# Patient Record
Sex: Male | Born: 1940 | Race: White | Hispanic: No | Smoking: Former smoker
Health system: Southern US, Community
[De-identification: ages and names within clinical notes are randomized; demographics above are authoritative.]

## PROBLEM LIST (undated history)

## (undated) ENCOUNTER — Emergency Department (HOSPITAL_BASED_OUTPATIENT_CLINIC_OR_DEPARTMENT_OTHER): Admission: EM | Discharge status: Absent without leave | Payer: Medicare Other

## (undated) DIAGNOSIS — I219 Acute myocardial infarction, unspecified: Secondary | ICD-10-CM

## (undated) DIAGNOSIS — T402X5A Adverse effect of other opioids, initial encounter: Secondary | ICD-10-CM

## (undated) DIAGNOSIS — K5903 Drug induced constipation: Secondary | ICD-10-CM

## (undated) DIAGNOSIS — I679 Cerebrovascular disease, unspecified: Secondary | ICD-10-CM

## (undated) DIAGNOSIS — Q7290 Unspecified reduction defect of unspecified lower limb: Secondary | ICD-10-CM

## (undated) DIAGNOSIS — N138 Other obstructive and reflux uropathy: Secondary | ICD-10-CM

## (undated) DIAGNOSIS — J449 Chronic obstructive pulmonary disease, unspecified: Secondary | ICD-10-CM

## (undated) DIAGNOSIS — I739 Peripheral vascular disease, unspecified: Secondary | ICD-10-CM

## (undated) DIAGNOSIS — I10 Essential (primary) hypertension: Secondary | ICD-10-CM

## (undated) DIAGNOSIS — Z960 Presence of urogenital implants: Secondary | ICD-10-CM

## (undated) DIAGNOSIS — I6529 Occlusion and stenosis of unspecified carotid artery: Secondary | ICD-10-CM

## (undated) DIAGNOSIS — I441 Atrioventricular block, second degree: Secondary | ICD-10-CM

## (undated) DIAGNOSIS — I509 Heart failure, unspecified: Secondary | ICD-10-CM

## (undated) DIAGNOSIS — K7460 Unspecified cirrhosis of liver: Secondary | ICD-10-CM

## (undated) DIAGNOSIS — R52 Pain, unspecified: Secondary | ICD-10-CM

## (undated) DIAGNOSIS — I2510 Atherosclerotic heart disease of native coronary artery without angina pectoris: Secondary | ICD-10-CM

## (undated) DIAGNOSIS — G709 Myoneural disorder, unspecified: Secondary | ICD-10-CM

## (undated) DIAGNOSIS — E785 Hyperlipidemia, unspecified: Secondary | ICD-10-CM

## (undated) DIAGNOSIS — Z87891 Personal history of nicotine dependence: Secondary | ICD-10-CM

## (undated) DIAGNOSIS — I714 Abdominal aortic aneurysm, without rupture, unspecified: Secondary | ICD-10-CM

## (undated) DIAGNOSIS — R0602 Shortness of breath: Secondary | ICD-10-CM

## (undated) DIAGNOSIS — H5440 Blindness, one eye, unspecified eye: Secondary | ICD-10-CM

## (undated) DIAGNOSIS — N401 Enlarged prostate with lower urinary tract symptoms: Secondary | ICD-10-CM

## (undated) HISTORY — DX: Acute myocardial infarction, unspecified: I21.9

## (undated) HISTORY — DX: Atrioventricular block, second degree: I44.1

## (undated) HISTORY — DX: Chronic obstructive pulmonary disease, unspecified: J44.9

## (undated) HISTORY — DX: Atherosclerotic heart disease of native coronary artery without angina pectoris: I25.10

## (undated) HISTORY — DX: Cerebrovascular disease, unspecified: I67.9

## (undated) HISTORY — DX: Abdominal aortic aneurysm, without rupture, unspecified: I71.40

## (undated) HISTORY — DX: Essential (primary) hypertension: I10

## (undated) HISTORY — DX: Abdominal aortic aneurysm, without rupture: I71.4

## (undated) HISTORY — DX: Personal history of nicotine dependence: Z87.891

## (undated) HISTORY — DX: Unspecified reduction defect of unspecified lower limb: Q72.90

## (undated) HISTORY — DX: Benign prostatic hyperplasia with lower urinary tract symptoms: N40.1

## (undated) HISTORY — DX: Unspecified cirrhosis of liver: K74.60

## (undated) HISTORY — DX: Hyperlipidemia, unspecified: E78.5

## (undated) HISTORY — DX: Other obstructive and reflux uropathy: N13.8

## (undated) HISTORY — DX: Peripheral vascular disease, unspecified: I73.9

---

## 2008-10-06 ENCOUNTER — Ambulatory Visit: Payer: Self-pay | Admitting: *Deleted

## 2008-10-06 ENCOUNTER — Encounter (INDEPENDENT_AMBULATORY_CARE_PROVIDER_SITE_OTHER): Payer: Self-pay | Admitting: *Deleted

## 2008-10-06 ENCOUNTER — Inpatient Hospital Stay (HOSPITAL_COMMUNITY): Admission: EM | Admit: 2008-10-06 | Discharge: 2008-10-13 | Payer: Self-pay | Admitting: *Deleted

## 2008-10-06 ENCOUNTER — Ambulatory Visit: Payer: Self-pay | Admitting: Cardiology

## 2008-10-06 ENCOUNTER — Encounter: Payer: Self-pay | Admitting: Cardiology

## 2008-10-07 ENCOUNTER — Encounter: Payer: Self-pay | Admitting: Cardiology

## 2008-10-07 ENCOUNTER — Ambulatory Visit: Payer: Self-pay | Admitting: Thoracic Surgery (Cardiothoracic Vascular Surgery)

## 2008-10-07 ENCOUNTER — Encounter: Payer: Self-pay | Admitting: Internal Medicine

## 2008-10-09 ENCOUNTER — Ambulatory Visit: Payer: Self-pay | Admitting: Pulmonary Disease

## 2008-10-10 ENCOUNTER — Encounter: Payer: Self-pay | Admitting: Cardiothoracic Surgery

## 2008-10-30 ENCOUNTER — Encounter: Payer: Self-pay | Admitting: Cardiology

## 2008-10-30 ENCOUNTER — Ambulatory Visit: Payer: Self-pay | Admitting: Thoracic Surgery (Cardiothoracic Vascular Surgery)

## 2008-11-03 ENCOUNTER — Inpatient Hospital Stay (HOSPITAL_COMMUNITY)
Admission: RE | Admit: 2008-11-03 | Discharge: 2008-11-09 | Payer: Self-pay | Admitting: Thoracic Surgery (Cardiothoracic Vascular Surgery)

## 2008-11-03 ENCOUNTER — Encounter: Payer: Self-pay | Admitting: Thoracic Surgery (Cardiothoracic Vascular Surgery)

## 2008-11-03 ENCOUNTER — Ambulatory Visit: Payer: Self-pay | Admitting: Thoracic Surgery (Cardiothoracic Vascular Surgery)

## 2008-11-03 ENCOUNTER — Encounter: Payer: Self-pay | Admitting: Cardiology

## 2008-11-03 HISTORY — PX: CORONARY ARTERY BYPASS GRAFT: SHX141

## 2008-11-13 DIAGNOSIS — I10 Essential (primary) hypertension: Secondary | ICD-10-CM | POA: Insufficient documentation

## 2008-11-13 DIAGNOSIS — E785 Hyperlipidemia, unspecified: Secondary | ICD-10-CM | POA: Insufficient documentation

## 2008-11-13 DIAGNOSIS — J449 Chronic obstructive pulmonary disease, unspecified: Secondary | ICD-10-CM | POA: Insufficient documentation

## 2008-11-13 DIAGNOSIS — I219 Acute myocardial infarction, unspecified: Secondary | ICD-10-CM | POA: Insufficient documentation

## 2008-11-13 DIAGNOSIS — K560 Paralytic ileus: Secondary | ICD-10-CM | POA: Insufficient documentation

## 2008-11-13 DIAGNOSIS — Z951 Presence of aortocoronary bypass graft: Secondary | ICD-10-CM | POA: Insufficient documentation

## 2008-11-13 DIAGNOSIS — I2510 Atherosclerotic heart disease of native coronary artery without angina pectoris: Secondary | ICD-10-CM | POA: Insufficient documentation

## 2008-11-13 DIAGNOSIS — D649 Anemia, unspecified: Secondary | ICD-10-CM | POA: Insufficient documentation

## 2008-11-14 ENCOUNTER — Ambulatory Visit: Payer: Self-pay | Admitting: Cardiology

## 2008-11-22 ENCOUNTER — Telehealth: Payer: Self-pay | Admitting: Cardiology

## 2008-12-04 ENCOUNTER — Encounter: Payer: Self-pay | Admitting: Cardiology

## 2008-12-04 ENCOUNTER — Ambulatory Visit: Payer: Self-pay | Admitting: Thoracic Surgery (Cardiothoracic Vascular Surgery)

## 2008-12-04 ENCOUNTER — Encounter
Admission: RE | Admit: 2008-12-04 | Discharge: 2008-12-04 | Payer: Self-pay | Admitting: Thoracic Surgery (Cardiothoracic Vascular Surgery)

## 2008-12-15 ENCOUNTER — Telehealth: Payer: Self-pay | Admitting: Cardiology

## 2009-01-08 ENCOUNTER — Ambulatory Visit: Payer: Self-pay | Admitting: Cardiology

## 2009-01-08 DIAGNOSIS — R0989 Other specified symptoms and signs involving the circulatory and respiratory systems: Secondary | ICD-10-CM | POA: Insufficient documentation

## 2009-01-08 DIAGNOSIS — I6529 Occlusion and stenosis of unspecified carotid artery: Secondary | ICD-10-CM | POA: Insufficient documentation

## 2009-01-09 LAB — CONVERTED CEMR LAB
ALT: 36 units/L (ref 0–53)
AST: 29 units/L (ref 0–37)
Albumin: 3.8 g/dL (ref 3.5–5.2)
Alkaline Phosphatase: 78 units/L (ref 39–117)
BUN: 17 mg/dL (ref 6–23)
Bilirubin, Direct: 0.1 mg/dL (ref 0.0–0.3)
CO2: 28 meq/L (ref 19–32)
Calcium: 9.1 mg/dL (ref 8.4–10.5)
Chloride: 107 meq/L (ref 96–112)
Cholesterol: 136 mg/dL (ref 0–200)
Creatinine, Ser: 0.9 mg/dL (ref 0.4–1.5)
GFR calc non Af Amer: 89.68 mL/min (ref >60)
Glucose, Bld: 81 mg/dL (ref 70–99)
HDL: 35.9 mg/dL — ABNORMAL LOW (ref >39.00)
LDL Cholesterol: 86 mg/dL (ref 0–99)
Potassium: 3.4 meq/L — ABNORMAL LOW (ref 3.5–5.1)
Sodium: 142 meq/L (ref 135–145)
Total Bilirubin: 1.2 mg/dL (ref 0.3–1.2)
Total CHOL/HDL Ratio: 4
Total Protein: 8.2 g/dL (ref 6.0–8.3)
Triglycerides: 70 mg/dL (ref 0.0–149.0)
VLDL: 14 mg/dL (ref 0.0–40.0)

## 2009-01-19 ENCOUNTER — Ambulatory Visit: Payer: Self-pay

## 2009-01-19 ENCOUNTER — Ambulatory Visit: Payer: Self-pay | Admitting: Cardiology

## 2009-01-19 DIAGNOSIS — E875 Hyperkalemia: Secondary | ICD-10-CM | POA: Insufficient documentation

## 2009-01-22 ENCOUNTER — Encounter (INDEPENDENT_AMBULATORY_CARE_PROVIDER_SITE_OTHER): Payer: Self-pay | Admitting: *Deleted

## 2009-01-22 LAB — CONVERTED CEMR LAB
BUN: 18 mg/dL (ref 6–23)
CO2: 26 meq/L (ref 19–32)
Calcium: 9 mg/dL (ref 8.4–10.5)
Chloride: 107 meq/L (ref 96–112)
Creatinine, Ser: 0.9 mg/dL (ref 0.4–1.5)
GFR calc non Af Amer: 89.67 mL/min (ref >60)
Glucose, Bld: 131 mg/dL — ABNORMAL HIGH (ref 70–99)
Potassium: 4.1 meq/L (ref 3.5–5.1)
Sodium: 140 meq/L (ref 135–145)

## 2009-01-30 ENCOUNTER — Ambulatory Visit: Payer: Self-pay | Admitting: Cardiology

## 2009-02-06 ENCOUNTER — Encounter (INDEPENDENT_AMBULATORY_CARE_PROVIDER_SITE_OTHER): Payer: Self-pay | Admitting: *Deleted

## 2009-02-15 ENCOUNTER — Telehealth: Payer: Self-pay | Admitting: Cardiology

## 2009-02-22 ENCOUNTER — Encounter: Payer: Self-pay | Admitting: Cardiology

## 2009-03-02 ENCOUNTER — Telehealth: Payer: Self-pay | Admitting: Cardiology

## 2009-03-28 ENCOUNTER — Ambulatory Visit: Payer: Self-pay | Admitting: Cardiology

## 2009-03-30 ENCOUNTER — Encounter (INDEPENDENT_AMBULATORY_CARE_PROVIDER_SITE_OTHER): Payer: Self-pay | Admitting: *Deleted

## 2009-03-30 LAB — CONVERTED CEMR LAB
ALT: 34 units/L (ref 0–53)
AST: 33 units/L (ref 0–37)
Albumin: 3.9 g/dL (ref 3.5–5.2)
Alkaline Phosphatase: 66 units/L (ref 39–117)
Bilirubin, Direct: 0.3 mg/dL (ref 0.0–0.3)
Cholesterol: 120 mg/dL (ref 0–200)
HDL: 40.1 mg/dL (ref >39.00)
LDL Cholesterol: 70 mg/dL (ref 0–99)
Total Bilirubin: 1.6 mg/dL — ABNORMAL HIGH (ref 0.3–1.2)
Total CHOL/HDL Ratio: 3
Total Protein: 8.1 g/dL (ref 6.0–8.3)
Triglycerides: 49 mg/dL (ref 0.0–149.0)
VLDL: 9.8 mg/dL (ref 0.0–40.0)

## 2009-04-11 ENCOUNTER — Ambulatory Visit: Payer: Self-pay | Admitting: Internal Medicine

## 2009-04-11 DIAGNOSIS — Z87891 Personal history of nicotine dependence: Secondary | ICD-10-CM | POA: Insufficient documentation

## 2009-04-11 DIAGNOSIS — Q7290 Unspecified reduction defect of unspecified lower limb: Secondary | ICD-10-CM | POA: Insufficient documentation

## 2009-04-12 ENCOUNTER — Telehealth: Payer: Self-pay | Admitting: Internal Medicine

## 2009-04-12 DIAGNOSIS — I4891 Unspecified atrial fibrillation: Secondary | ICD-10-CM | POA: Insufficient documentation

## 2009-05-07 ENCOUNTER — Telehealth: Payer: Self-pay | Admitting: Internal Medicine

## 2009-05-08 ENCOUNTER — Telehealth (INDEPENDENT_AMBULATORY_CARE_PROVIDER_SITE_OTHER): Payer: Self-pay | Admitting: *Deleted

## 2009-05-08 DIAGNOSIS — M549 Dorsalgia, unspecified: Secondary | ICD-10-CM | POA: Insufficient documentation

## 2009-07-11 ENCOUNTER — Ambulatory Visit: Payer: Self-pay | Admitting: Internal Medicine

## 2009-07-13 ENCOUNTER — Telehealth: Payer: Self-pay | Admitting: Internal Medicine

## 2009-08-21 ENCOUNTER — Telehealth: Payer: Self-pay | Admitting: Internal Medicine

## 2009-09-14 ENCOUNTER — Telehealth: Payer: Self-pay | Admitting: Internal Medicine

## 2009-11-20 ENCOUNTER — Telehealth: Payer: Self-pay | Admitting: Cardiology

## 2009-11-27 ENCOUNTER — Telehealth: Payer: Self-pay | Admitting: Internal Medicine

## 2010-01-07 ENCOUNTER — Ambulatory Visit: Payer: Self-pay | Admitting: Internal Medicine

## 2010-01-07 DIAGNOSIS — J329 Chronic sinusitis, unspecified: Secondary | ICD-10-CM | POA: Insufficient documentation

## 2010-01-29 ENCOUNTER — Encounter: Payer: Self-pay | Admitting: Internal Medicine

## 2010-04-18 ENCOUNTER — Ambulatory Visit: Payer: Self-pay | Admitting: Cardiology

## 2010-05-15 ENCOUNTER — Telehealth: Payer: Self-pay | Admitting: Internal Medicine

## 2010-05-20 ENCOUNTER — Other Ambulatory Visit: Payer: Self-pay | Admitting: Internal Medicine

## 2010-05-20 ENCOUNTER — Ambulatory Visit
Admission: RE | Admit: 2010-05-20 | Discharge: 2010-05-20 | Payer: Self-pay | Source: Home / Self Care | Attending: Internal Medicine | Admitting: Internal Medicine

## 2010-05-20 LAB — LIPID PANEL
Cholesterol: 130 mg/dL (ref 0–200)
HDL: 48.4 mg/dL (ref >39.00)
LDL Cholesterol: 72 mg/dL (ref 0–99)
Total CHOL/HDL Ratio: 3
Triglycerides: 50 mg/dL (ref 0.0–149.0)
VLDL: 10 mg/dL (ref 0.0–40.0)

## 2010-05-20 LAB — HEPATIC FUNCTION PANEL
ALT: 54 U/L — ABNORMAL HIGH (ref 0–53)
AST: 42 U/L — ABNORMAL HIGH (ref 0–37)
Albumin: 4.2 g/dL (ref 3.5–5.2)
Alkaline Phosphatase: 79 U/L (ref 39–117)
Bilirubin, Direct: 0.2 mg/dL (ref 0.0–0.3)
Total Bilirubin: 1.1 mg/dL (ref 0.3–1.2)
Total Protein: 8.2 g/dL (ref 6.0–8.3)

## 2010-05-21 NOTE — Miscellaneous (Signed)
Summary: Care plan for PT/[facility]  Care plan for PT/[facility]   Imported By: Sherian Rein 01/30/2010 14:23:25  _____________________________________________________________________  External Attachment:    Type:   Image     Comment:   External Document

## 2010-05-21 NOTE — Assessment & Plan Note (Signed)
Summary: NEW MEDICARE-PER [facility]/ [facility] #  [facility]   Vital Signs:  Patient profile:   70 year old male Height:      71 inches (180.34 cm) Weight:      220.8 pounds (100.36 kg) O2 Sat:      97 % on Room air Temp:     97.5 degrees F (36.39 degrees C) oral Pulse rate:   66 / minute BP sitting:   132 / 70  (left arm) Cuff size:   large  Vitals Entered By: Orlan Leavens (April 11, 2009 3:07 PM)  O2 Flow:  Room air CC: New patient Is Patient Diabetic? No Pain Assessment Patient in pain? no        Primary Care Provider:  Newt Lukes MD  CC:  New patient.  History of Present Illness: new pt to me and our division - here to est PCP  1) HTN - reports compliance with ongoing medical treatment and no changes in medication dose or frequency. denies adverse side effects related to current therapy.   2) dyslipidemia - reports compliance with ongoing medical treatment and no changes in medication dose or frequency. denies adverse side effects related to current therapy.   3) CAD - s/p CABG 10/2008 reports compliance with ongoing medical treatment and no changes in medication dose or frequency. denies adverse side effects related to current therapy. 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 - pain was imporved after taking percocet s/p CABG but caused "foggy head" - ?if any other pain pill he could take for bad days - has not pursued lift shoes or PT or other therapy since the 70s -  Preventive Screening-Counseling & Management  Alcohol-Tobacco     Alcohol drinks/day: 0     Alcohol Counseling: not indicated; patient does not drink     Smoking Status: quit < 6 months     Tobacco Counseling: to remain off tobacco products  Caffeine-Diet-Exercise     Depression Counseling: not indicated; screening negative for depression  Clinical Review Panels:  Immunizations   Last Flu Vaccine:  Historical (01/19/2009)    Last Pneumovax:  Historical (01/19/2009)  Lipid Management   Cholesterol:  120 (03/28/2009)   LDL (bad choesterol):  70 (03/28/2009)   HDL (good cholesterol):  40.10 (03/28/2009)  Complete Metabolic Panel   Glucose:  131 (01/19/2009)   Sodium:  140 (01/19/2009)   Potassium:  4.1 (01/19/2009)   Chloride:  107 (01/19/2009)   CO2:  26 (01/19/2009)   BUN:  18 (01/19/2009)   Creatinine:  0.9 (01/19/2009)   Albumin:  3.9 (03/28/2009)   Total Protein:  8.1 (03/28/2009)   Calcium:  9.0 (01/19/2009)   Total Bili:  1.6 (03/28/2009)   Alk Phos:  66 (03/28/2009)   SGPT (ALT):  34 (03/28/2009)   SGOT (AST):  33 (03/28/2009)   Current Medications (verified): 1)  Aspirin 81 Mg Tbec (Aspirin) .... Take One Tablet By Mouth Daily 2)  Metoprolol Tartrate 25 Mg Tabs (Metoprolol Tartrate) .... Take One Half Tablet By Mouth Twice A Day 3)  Furosemide 40 Mg Tabs (Furosemide) .... Take One Tablet By Mouth Daily. 4)  Potassium Chloride Crys Cr 20 Meq Cr-Tabs (Potassium Chloride Crys Cr) .... One Tablet By Mouth Twice Daily 5)  Wellbutrin Xl 150 Mg Xr24h-Tab (Bupropion Hcl) .[name].. 1 Tab By Mouth Once Daily 6)  Lipitor 40 Mg Tabs (Atorvastatin Calcium) .... Take  One Tablet By Mouth Daily. 7)  Mucinex 600 Mg Xr12h-Tab (Guaifenesin) .[name].. 1 Tab By Mouth Two Times A Day 8)  Oxycodone-Acetaminophen 5-500 Mg Caps (Oxycodone-Acetaminophen) .... As Needed 9)  Albuterol Sulfate (2.5 Mg/20ml) 0.083% Nebu (Albuterol Sulfate) .... 2 Puffs As Needed 10)  Nitroglycerin 0.4 Mg Subl (Nitroglycerin) .... One Tablet Under Tongue Every 5 Minutes As Needed For Chest Pain---May Repeat Times Three 11)  Ativan 0.5 Mg Tabs (Lorazepam) .... As Needed 12)  Ambien 5 Mg Tabs (Zolpidem Tartrate) .... Take 1 Tab By Mouth At Bedtime As Needed For Sleep 13)  Amlodipine Besylate 10 Mg Tabs (Amlodipine Besylate) .... One By Mouth Daily 14)  Niaspan 500 Mg Cr-Tabs (Niacin (Antihyperlipidemic)) .... 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  11/03/2008 - [facility]  Family History: His mother died at 70 with cirrhosis  father died at 87.  His father took a nap on the couch and was found dead on  the floor beside the couch with no other information available and no autopsy performed, although the family was told it was heart attack.   There are no siblings with coronary artery disease.   Social History: patient is widowed, and lives alone here in [place].   He has 1 son, who lives in the [place], and he has an uncle, who lives nearby as well.   The patient reports a previous history of heavy alcohol use, but he quit drinking many years ago.  Tobacco Use - Former.  Smoking Status:  quit < 6 months  Review of Systems       see HPI above. I have reviewed all other systems and they were negative.   Physical Exam  General:  alert, well-developed, well-nourished, and cooperative to examination.    Eyes:  vision grossly intact; pupils equal, round and reactive to light.  conjunctiva and lids normal.    Ears:  normal pinnae bilaterally, without erythema, swelling, or tenderness to palpation. TMs clear, without effusion, or cerumen impaction. Hearing grossly normal bilaterally  Mouth:  teeth and gums in good repair; mucous membranes moist, without lesions or ulcers. oropharynx clear without  exudate, no erythema.  Lungs:  normal respiratory effort, no intercostal retractions or use of accessory muscles; normal breath sounds bilaterally - no crackles and no wheezes.    Heart:  regular rate and rhythm with 2/6 systolic murmur left sternal border. Radiates to the carotids. Abdomen:  soft, non-tender, normal bowel sounds, no distention; no masses and no appreciable hepatomegaly or splenomegaly.   Msk:  No deformity or scoliosis noted of thoracic or lumbar spine.  notable LLE shortening compared to RLE Neurologic:  alert & oriented X3 and cranial nerves II-XII symetrically intact.  strength normal in all extremities, sensation intact to light touch, and altered gait due to leg length disrecpancy. speech fluent without dysarthria or aphasia; follows commands with good comprehension.  Skin:  no rashes, vesicles, ulcers, or erythema. No  nodules or irregularity to palpation.  Psych:  Oriented X3, memory intact for recent and remote, normally interactive, good eye contact, not anxious appearing, not depressed appearing, and not agitated.      Impression & Recommendations:  Problem # 1:  CAD (ICD-414.00) cabg 11/03/08 hx reviewed - no residual or active symptoms  cont med mgmt as ongoing  Time spent with patient 45 minutes, more than 50% of this time was spent counseling patient on his chronic health issues and their med mgmt as well as need for continued survellience even in absence of active symptoms - also educated on med mgmt of chronic pain and prefered approach at treating causes of pain over covering the pain itself   His updated medication list for this problem includes:    Aspirin 81 Mg Tbec (Aspirin) .[name]... Take one tablet by mouth daily    Metoprolol Tartrate 25 Mg Tabs (Metoprolol tartrate) .[name]... Take one half tablet by mouth twice a day    Furosemide 40 Mg Tabs (Furosemide) .[name]... Take one tablet by mouth daily.    Nitroglycerin 0.4 Mg Subl (Nitroglycerin) ..... One tablet under  tongue every 5 minutes as needed for chest pain---may repeat times three    Amlodipine Besylate 10 Mg Tabs (Amlodipine besylate) ..... One by mouth daily  Labs Reviewed: Chol: 120 (03/28/2009)   HDL: 40.10 (03/28/2009)   LDL: 70 (03/28/2009)   TG: 49.0 (03/28/2009)  Problem # 2:  HYPERTENSION (ICD-401.9)  His updated medication list for this problem includes:    Metoprolol Tartrate 25 Mg Tabs (Metoprolol tartrate) .[name]... Take one half tablet by mouth twice a day    Furosemide 40 Mg Tabs (Furosemide) .[name]... Take one tablet by mouth daily.    Amlodipine Besylate 10 Mg Tabs (Amlodipine besylate) ..... One by mouth daily  BP today: 132/70 Prior BP: 166/70 (01/08/2009)  Labs Reviewed: K+: 4.1 (01/19/2009) Creat: : 0.9 (01/19/2009)   Chol: 120 (03/28/2009)   HDL: 40.10 (03/28/2009)   LDL: 70 (03/28/2009)   TG: 49.0 (03/28/2009)  Problem # 3:  HYPERLIPIDEMIA (ICD-272.4)  His updated medication list for this problem includes:    Lipitor 40 Mg Tabs (Atorvastatin calcium) .[name]... Take one tablet by mouth daily.    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 Take one tablet by mouth daily    Metoprolol Tartrate 25 Mg  Tabs (Metoprolol tartrate) .[name]... Take one half tablet by mouth twice a day    Amlodipine Besylate 10 Mg Tabs (Amlodipine besylate) ..... One by mouth daily  01/08/2009 - per cards: Patient has had no further atrial fibrillation. I believe previous rhythm disturbance was related to cardiac surgery. We'll discontinue amiodarone. The following medications were removed from the medication list:    Plavix 75 Mg Tabs (Clopidogrel bisulfate) .[name]... Take  one tablet by mouth daily    Amiodarone Hcl 400 Mg Tabs (Amiodarone hcl) ..... 200mg  one tablet once daily  Complete Medication List: 1)  Aspirin 81 Mg Tbec (Aspirin) .... Take one tablet by mouth daily 2)  Metoprolol Tartrate 25 Mg Tabs (Metoprolol tartrate) .... Take one half tablet by mouth twice a day 3)  Furosemide 40 Mg Tabs (Furosemide) .... Take one tablet by mouth daily. 4)  Potassium Chloride Crys Cr 20 Meq Cr-tabs (Potassium chloride crys cr) .... One tablet by mouth twice daily 5)  Wellbutrin Xl 150 Mg Xr24h-tab (Bupropion hcl) .[name].. 1 tab by mouth once daily 6)  Lipitor 40 Mg Tabs (Atorvastatin calcium) .... Take one tablet by mouth daily. 7)  Mucinex 600 Mg Xr12h-tab (Guaifenesin) .[name].. 1 tab by mouth two times a day 8)  Albuterol Sulfate (2.5 Mg/18ml) 0.083% Nebu (Albuterol sulfate) .... 2 puffs as needed 9)  Nitroglycerin 0.4 Mg Subl (Nitroglycerin) .... One tablet under tongue every 5 minutes as needed for chest pain---may repeat times three 10)  Ambien 5 Mg Tabs (Zolpidem tartrate) .... Take 1 tab by mouth at bedtime as needed for sleep 11)  Amlodipine Besylate 10 Mg Tabs (Amlodipine besylate) .... One by mouth daily 12)  Niaspan 500 Mg Cr-tabs (Niacin (antihyperlipidemic)) .... Two tablets by mouth once daily 13)  Ultracet 37.5-325 Mg Tabs (Tramadol-acetaminophen) .[name].. 1-2 by mouth two times a day as needed for pain  Patient Instructions: 1)  it was good to see you today.  2)  try generic ultracet for your pain - your prescription has  been electronically submitted to your pharmacy. Please take as directed. Contact our office if you believe you're having problems with the medication(s).  3)  no other medication changes -  4)  your labs and medical history have been reviewed today 5)  Please schedule a follow-up appointment in 3-6 months, sooner if problems.  Prescriptions: ULTRACET 37.5-325 MG TABS (TRAMADOL-ACETAMINOPHEN) 1-2 by mouth two times a day as needed for pain  #60 x 3   Entered and Authorized by:   Newt Lukes MD   Signed by:   Newt Lukes MD on 04/11/2009   Method used:   [facility]        [facility]  [address] [id number]       [address]       [place], [place]  [address]       Ph: [phone] or [phone]       Fax: [phone]   RxID:   [id number]    Immunization History:  Influenza Immunization History:    Influenza:  historical (01/19/2009)  Pneumovax Immunization History:    Pneumovax:  historical (01/19/2009)

## 2010-05-21 NOTE — Miscellaneous (Signed)
Summary: update potassium  Clinical Lists Changes  Medications: Changed medication from POTASSIUM CHLORIDE CRYS CR 20 MEQ CR-TABS (POTASSIUM CHLORIDE CRYS CR) 1 tab by mouth once daily to POTASSIUM CHLORIDE CRYS CR 20 MEQ CR-TABS (POTASSIUM CHLORIDE CRYS CR) one tablet by mouth twice daily - Signed Rx of POTASSIUM CHLORIDE CRYS CR 20 MEQ CR-TABS (POTASSIUM CHLORIDE CRYS CR) one tablet by mouth twice daily;  #60 x 12;  Signed;  Entered by: Deliah Goody, RN;  Authorized by: Ferman Hamming, MD, [facility];  Method used: [facility]  [address] [id number] [address] [place], [place]  [address], Ph: [phone] or [phone], Fax: [phone]    Prescriptions: POTASSIUM CHLORIDE CRYS CR 20 MEQ CR-TABS (POTASSIUM CHLORIDE CRYS CR) one tablet by mouth twice daily  #60 x 12   Entered by:   Deliah Goody, RN   Authorized by:   Ferman Hamming, MD, [facility]   Signed by:   Deliah Goody, RN on 01/22/2009   Method used:   [facility]        [facility]  [address] [id number]       [address]       [place], [place]  [address]       Ph: [phone] or [phone]       Fax: [phone]   RxID:   [id number]

## 2010-05-21 NOTE — Letter (Signed)
Summary: [facility]  [facility]   Imported By: Roderic Ovens 12/27/2008 15:18:12  _____________________________________________________________________  External Attachment:    Type:   Image     Comment:   External Document

## 2010-05-21 NOTE — Letter (Signed)
Summary: [facility]  [facility]   Imported By: Sherian Rein 11/17/2008 11:26:32  _____________________________________________________________________  External Attachment:    Type:   Image     Comment:   External Document

## 2010-05-21 NOTE — Consult Note (Signed)
Summary: [facility]  [facility]   Imported By: Marylou Mccoy 11/08/2008 10:16:25  _____________________________________________________________________  External Attachment:    Type:   Image     Comment:   External Document

## 2010-05-21 NOTE — Assessment & Plan Note (Signed)
Summary: 6 MTH FU  STC   Vital Signs:  Patient profile:   70 year old male Height:      71 inches (180.34 cm) Weight:      224.4 pounds (102 kg) BMI:     31.41 O2 Sat:      97 % on Room air Temp:     97.6 degrees F (36.44 degrees C) oral Pulse rate:   68 / minute BP sitting:   132 / 70  (left arm) Cuff size:   regular  Vitals Entered By: Orlan Leavens RMA (January 07, 2010 4:15 PM)  O2 Flow:  Room air CC: 6 month follow-up Is Patient Diabetic? No Comments Req refill on hydrocodone, also want flu shot   Primary Care Provider:  Newt Lukes MD  CC:  6 month follow-up.  History of Present Illness: here for f/u 6 mo  1) HTN - reports compliance with ongoing medical treatment and no changes in medication dose or frequency. denies adverse side effects related to current therapy. no HA or vision change or weakness, no edema  2) dyslipidemia - reports compliance with ongoing medical treatment and no changes in medication dose or frequency. denies adverse side effects related to current therapy. no muscle aches or GI signs   3) CAD - s/p CABG 10/2008 reports compliance with ongoing medical treatment and no changes in medication dose or frequency. denies adverse side effects related to current therapy. 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 - pain was improved after taking percocet s/p CABG but caused "foggy head" - uses occassional hydrocodone for bad days - tried ulracet from12/2010 with no relief has not pursued lift shoes or PT or other therapy since the 70s - needs refill on meds  Clinical Review Panels:  Immunizations   Last Flu Vaccine:  Fluvax 3+ (01/07/2010)   Last Pneumovax:  Historical (01/19/2009)  Lipid Management   Cholesterol:  120 (03/28/2009)   LDL (bad choesterol):  70 (03/28/2009)   HDL (good cholesterol):  40.10 (03/28/2009)  Diabetes Management   Creatinine:  0.9  (01/19/2009)   Last Flu Vaccine:  Fluvax 3+ (01/07/2010)   Last Pneumovax:  Historical (01/19/2009)  Complete Metabolic Panel   Glucose:  131 (01/19/2009)   Sodium:  140 (01/19/2009)   Potassium:  4.1 (01/19/2009)   Chloride:  107 (01/19/2009)   CO2:  26 (01/19/2009)   BUN:  18 (01/19/2009)   Creatinine:  0.9 (01/19/2009)   Albumin:  3.9 (03/28/2009)   Total Protein:  8.1 (03/28/2009)   Calcium:  9.0 (01/19/2009)   Total Bili:  1.6 (03/28/2009)   Alk Phos:  66 (03/28/2009)   SGPT (ALT):  34 (03/28/2009)   SGOT (AST):  33 (03/28/2009)   Current Medications (verified): 1)  Aspirin 81 Mg Tbec (Aspirin) .... Take One Tablet By Mouth Daily 2)  Metoprolol Tartrate 25 Mg Tabs (Metoprolol Tartrate) .... Take One Half Tablet By Mouth Twice A Day 3)  Furosemide 40 Mg Tabs (Furosemide) .... Take One Tablet By Mouth Daily. 4)  Potassium Chloride Crys Cr 20 Meq Cr-Tabs (Potassium Chloride Crys Cr) .... One Tablet By Mouth Twice Daily 5)  Wellbutrin Xl 150 Mg Xr24h-Tab (Bupropion Hcl) .[name].. 1 Tab By Mouth Once Daily 6)  Lipitor 40 Mg Tabs (Atorvastatin Calcium) .... Take One Tablet By Mouth Daily. 7)  Mucinex 600 Mg Xr12h-Tab (Guaifenesin) .[name].. 1 Tab By Mouth Two  Times A Day 8)  Albuterol Sulfate (2.5 Mg/3ml) 0.083% Nebu (Albuterol Sulfate) .... 2 Puffs As Needed 9)  Nitroglycerin 0.4 Mg Subl (Nitroglycerin) .... One Tablet Under Tongue Every 5 Minutes As Needed For Chest Pain---May Repeat Times Three 10)  Ambien 5 Mg Tabs (Zolpidem Tartrate) .... Take 1 Tab By Mouth At Bedtime As Needed For Sleep 11)  Amlodipine Besylate 10 Mg Tabs (Amlodipine Besylate) .... One By Mouth Daily 12)  Niaspan 500 Mg Cr-Tabs (Niacin (Antihyperlipidemic)) .... Two Tablets By Mouth Once Daily 13)  Hydrocodone-Acetaminophen 5-500 Mg Tabs (Hydrocodone-Acetaminophen) .... Take 1-2 By Mouth Once Daily As Needed For Pain 14)  Stool Softener 100 Mg Caps (Docusate Sodium) .... 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    Physical Exam  General:  alert, well-developed, well-nourished, and cooperative to examination.    Lungs:  normal respiratory effort, no intercostal retractions or use of accessory muscles; normal breath sounds bilaterally - no crackles and no wheezes.    Heart:  regular rate and rhythm with 2/6 systolic murmur left sternal border. Radiates to the carotids. mild LLE edema as compared to RLE (chronic) Msk:  No deformity or scoliosis noted of thoracic or lumbar spine.  notable LLE shortening compared to RLE which affects gait   Impression & Recommendations:  Problem # 1:  SINUSITIS (ICD-473.9)  c/o seasonal congestion - suspect allergic - add nasal steroid - erx done no evidence for infx His updated medication list for this problem includes:    Mucinex 600 Mg Xr12h-tab (Guaifenesin) .[name]... 1 tab by mouth two times a day    Nasonex 50 Mcg/act Susp (Mometasone furoate) .[name]... 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 Take one tablet by mouth  daily.    Niaspan 500 Mg Cr-tabs (Niacin (antihyperlipidemic)) .[name]..[name] Two tablets by mouth once daily  Labs Reviewed: SGOT: 33 (03/28/2009)   SGPT: 34 (03/28/2009)   HDL:40.10 (03/28/2009), 35.90 (01/08/2009)  LDL:70 (03/28/2009), 86 (01/08/2009)  Chol:120 (03/28/2009), 136 (01/08/2009)  Trig:49.0 (03/28/2009), 70.0 (01/08/2009)  Problem # 4:  HYPERTENSION (ICD-401.9)  His updated medication list for this problem includes:    Metoprolol Tartrate 25 Mg Tabs (Metoprolol tartrate) .[name]... Take one half tablet by mouth twice a day    Furosemide 40 Mg Tabs (Furosemide) .[name]... Take one tablet by mouth daily.    Amlodipine Besylate 10 Mg Tabs (Amlodipine besylate) ..... One by mouth daily  BP today: 132/70 Prior BP: 130/72 (07/11/2009)  Labs Reviewed: K+: 4.1 (01/19/2009) Creat: : 0.9 (01/19/2009)   Chol: 120 (03/28/2009)   HDL: 40.10 (03/28/2009)   LDL: 70 (03/28/2009)   TG: 49.0 (03/28/2009)  Problem # 5:  CAD (ICD-414.00)  s/p CABG 10/2008 - cont med mgmt His updated medication list for this problem includes:    Aspirin 81 Mg Tbec (Aspirin) .[name]... Take one tablet by mouth daily    Metoprolol Tartrate 25 Mg Tabs (Metoprolol tartrate) .[name]... Take one half tablet by mouth twice a day    Furosemide 40 Mg Tabs (Furosemide) .[name]... Take one tablet by mouth daily.    Nitroglycerin 0.4 Mg Subl (Nitroglycerin) ..... One tablet under tongue every 5 minutes as needed for chest pain---may repeat times three    Amlodipine Besylate 10 Mg Tabs (Amlodipine besylate) ..... One by mouth daily  Labs Reviewed: Chol: 120 (03/28/2009)   HDL: 40.10 (03/28/2009)   LDL: 70 (03/28/2009)   TG: 49.0 (03/28/2009)  Complete Medication List: 1)  Aspirin 81 Mg Tbec (Aspirin) .... Take one tablet by mouth daily 2)  Metoprolol Tartrate 25 Mg Tabs (Metoprolol tartrate) .... Take one half tablet by mouth twice a day 3)  Furosemide 40 Mg Tabs (Furosemide) .... Take one tablet by mouth daily. 4)  Potassium Chloride Crys Cr 20  Meq Cr-tabs (Potassium chloride crys cr) .... One tablet by mouth twice daily 5)  Wellbutrin Xl 150 Mg Xr24h-tab (Bupropion hcl) .[name].. 1 tab by mouth once daily 6)  Lipitor 40 Mg Tabs (Atorvastatin calcium) .... Take one tablet by mouth daily. 7)  Mucinex 600 Mg Xr12h-tab (Guaifenesin) .[name].. 1 tab by mouth two times a day 8)  Albuterol Sulfate (2.5 Mg/75ml) 0.083% Nebu (Albuterol sulfate) .... 2 puffs as needed 9)  Nitroglycerin 0.4 Mg Subl (Nitroglycerin) .... One tablet under tongue every 5 minutes as needed for chest pain---may repeat times three 10)  Ambien 5 Mg Tabs (Zolpidem tartrate) .... Take 1 tab by mouth at bedtime as needed for sleep 11)  Amlodipine Besylate 10 Mg Tabs (Amlodipine besylate) .... One by mouth daily 12)  Niaspan 500 Mg Cr-tabs (Niacin (antihyperlipidemic)) .... Two tablets by mouth once daily 13)  Hydrocodone-acetaminophen 5-500 Mg Tabs (Hydrocodone-acetaminophen) .... Take 1-2 by mouth once daily as needed for pain 14)  Stool Softener 100 Mg Caps (Docusate sodium) .... Take 1 two times a day 15)  Nasonex 50 Mcg/act Susp (Mometasone furoate) .[name].. 1 spray each nostril every morning  Other Orders: Flu Vaccine 62yrs + (04540) Administration Flu vaccine - MCR (J8119) Flu Vaccine Consent Questions     Do you have a history of severe allergic reactions to this vaccine? no    Any prior history of allergic reactions to egg and/or gelatin? no    Do you have a sensitivity to the preservative Thimersol? no    Do you have a past history of Guillan-Barre Syndrome? no    Do you currently have an acute febrile illness? no    Have you ever had a severe reaction to latex? no    Vaccine information given and explained to patient? yes    Are you currently pregnant? no    Lot Number:[id number]   Exp Date:10/18/2009   Site Given  Left Deltoid IM  Patient Instructions: 1)  it was good to see you today. 2)  flu shot done today 3)  medications reviewed today - refills on medications as  requested and start nasal steroid spray for sinus congestion - your prescriptions have been electronically submitted to your pharmacy. Please take as directed. Contact our office if you believe you're having problems with the medication(s).  4)  application for permanent handicap placard filled out for you today  5)  we'll make referral to physical therapy for shoe lift or other treatment as needed. Our office will contact you regarding this appointment once made.  6)  Please schedule a follow-up appointment in 6 months, sooner if problems.  Prescriptions: HYDROCODONE-ACETAMINOPHEN 5-500 MG TABS (HYDROCODONE-ACETAMINOPHEN) take 1-2 by mouth once daily as needed for pain  #40 x 3   Entered and Authorized by:   Newt Lukes MD   Signed by:   Newt Lukes MD on 01/07/2010   Method used:   Printed then [facility] ...       [id number]       [address]       [place], [place]  [address]       Ph: [phone] or [phone]       Fax: [phone]   RxID:   [id number] NASONEX 50 MCG/ACT SUSP (MOMETASONE FUROATE) 1 spray each nostril every morning  #1 x 6   Entered and Authorized by:   Newt Lukes MD   Signed by:   Newt Lukes MD on 01/07/2010   Method used:   [facility]        [id number]       [address]       [place], [place]  [address]       Ph: [phone] or [phone]       Fax: [phone]   RxID:   [id number]    .lbmedflu

## 2010-05-21 NOTE — Progress Notes (Signed)
Summary: pt needs something to help him sl;eep  Phone Note Call from Patient Call back at Home Phone [phone]   Reason for Call: Talk to Nurse, Talk to Doctor Summary of Call: pt cant sleep at night and want something callerd for him Initial call taken by: Omer Jack,  December 15, 2008 10:10 AM  Follow-up for Phone Call        Has been having sleeping problems for about a week.  No SOB or CP or any other symptoms. He doesn't have a PCP.  Told him I'd talk to the DOD & get back to him. Minerva Areola, RN, BSN  December 15, 2008 10:36 AM  Follow-up by: Minerva Areola, RN, BSN,  December 15, 2008 10:36 AM  Additional Follow-up for Phone Call Additional follow up Details #1::        Told pt that Ambien 5mg  was ordered & [facility] [address] Minerva Areola, RN, BSN  December 15, 2008 11:17 AM  Additional Follow-up by: Minerva Areola, RN, BSN,  December 15, 2008 11:17 AM    New/Updated Medications: AMBIEN 5 MG TABS (ZOLPIDEM TARTRATE) take 1 tab by mouth at bedtime as needed for sleep Prescriptions: AMBIEN 5 MG TABS (ZOLPIDEM TARTRATE) take 1 tab by mouth at bedtime as needed for sleep  #30 x 5   Entered by:   Minerva Areola, RN, BSN   Authorized by:   Verne Carrow, MD   Signed by:   Minerva Areola, RN, BSN on 12/15/2008   Method used:   Telephoned to ...       [id number]       [address]       [place], [place]  [address]       Ph: [phone] or [phone]       Fax: [phone]   RxID:   [id number]

## 2010-05-21 NOTE — Consult Note (Signed)
Summary: [facility] Consultation Report  [facility]ation [facility]   Imported By: Roderic Ovens 10/18/2008 13:07:21  _____________________________________________________________________  External Attachment:    Type:   Image     Comment:   External Document

## 2010-05-21 NOTE — Letter (Signed)
Summary: [facility]   [facility]   Imported By: Kassie Mends 03/20/2009 14:56:29  _____________________________________________________________________  External Attachment:    Type:   Image     Comment:   External Document

## 2010-05-21 NOTE — Letter (Signed)
Summary: Custom - Lipid  [facility], Main Office  [address]   [place], [place] [address]   Phone: [phone]  Fax: [phone]     March 30, 2009 MRN: [id number]   [facility] [address] [place], [place]  [address]   Dear Mr. Somera,  We have reviewed your cholesterol results.  They are as follows:     Total Cholesterol:    120 (Desirable: less than 200)       HDL  Cholesterol:     40.10  (Desirable: greater than 40 for men and 50 for women)       LDL Cholesterol:       70  (Desirable: less than 100 for low risk and less than 70 for moderate to high risk)       Triglycerides:       49.0  (Desirable: less than 150)  Our recommendations include:These numbers look good. Continue on the same medicine. Liver function is normal. Take care, Dr. Darel Hong.  Call our office at the number listed above if you have any questions.  Lowering your LDL cholesterol is important, but it is only one of a large number of "risk factors" that may indicate that you are at risk for heart disease, stroke or other complications of hardening of the arteries.  Other risk factors include:   A.  Cigarette Smoking* B.  High Blood Pressure* C.  Obesity* D.   Low HDL Cholesterol (see yours above)* E.   Diabetes Mellitus (higher risk if your is uncontrolled) F.  Family history of premature heart disease G.  Previous history of stroke or cardiovascular disease    *These are risk factors YOU HAVE CONTROL OVER.  For more information, visit .  There is now evidence that lowering the TOTAL CHOLESTEROL AND LDL CHOLESTEROL can reduce the risk of heart disease.  The American Heart Association recommends the following guidelines for the treatment of elevated cholesterol:  1.  If there is now current heart disease and less than two risk factors, TOTAL CHOLESTEROL should be less than 200 and LDL CHOLESTEROL should be less than 100. 2.  If there is current heart disease or two  or more risk factors, TOTAL CHOLESTEROL should be less than 200 and LDL CHOLESTEROL should be less than 70.  A diet low in cholesterol, saturated fat, and calories is the cornerstone of treatment for elevated cholesterol.  Cessation of smoking and exercise are also important in the management of elevated cholesterol and preventing vascular disease.  Studies have shown that 30 to 60 minutes of physical activity most days can help lower blood pressure, lower cholesterol, and keep your weight at a healthy level.  Drug therapy is used when cholesterol levels do not respond to therapeutic lifestyle changes (smoking cessation, diet, and exercise) and remains unacceptably high.  If medication is started, it is important to have you levels checked periodically to evaluate the need for further treatment options.  Thank you,    [facility] [employer] Team

## 2010-05-21 NOTE — Progress Notes (Signed)
Summary: ANKLES ARE SWELLING  Phone Note Call from Patient Call back at Home Phone [phone]   Caller: Patient Reason for Call: Talk to Nurse Summary of Call: ANKLES ARE SWELLING FROM HIS SURGERY WITH DR. Barry Dienes.  Initial call taken by: Lorne Skeens,  November 22, 2008 8:33 AM  Follow-up for Phone Call        since furosemide and kcl was stopped, pt has noticed swelling in ankles, right greater than left, meds stopped 7/30, no sob, elevate legs when possible w/some relief, is sch to see Dr Cornelius Moras on 8/16, will discuss w/Dr Jens Som and call pt back Meredith Staggers, RN  November 22, 2008 9:06 AM   discussed w/Dr Jens Som, restart pt on furosemide and kcl, pt aware, will send in rx Meredith Staggers, RN  November 22, 2008 9:44 AM     Prescriptions: POTASSIUM CHLORIDE CRYS CR 20 MEQ CR-TABS (POTASSIUM CHLORIDE CRYS CR) 1 tab by mouth once daily  #30 x 6   Entered by:   Meredith Staggers, RN   Authorized by:   Ferman Hamming, MD, [facility]   Signed by:   Meredith Staggers, RN on 11/22/2008   Method used:   [facility]        [facility] [id number]       [address]       [place], [place]  [address]       Ph: [phone] or [phone]       Fax: [phone]   RxID:   [id number] FUROSEMIDE 40 MG TABS (FUROSEMIDE) Take one tablet by mouth daily.  #30 x 6   Entered by:   Meredith Staggers, RN   Authorized by:   Ferman Hamming, MD, [facility]   Signed by:   Meredith Staggers, RN on 11/22/2008   Method used:   [facility]        [facility]  [address] [id number]       [address]       [place], [place]  [address]       Ph: [phone] or [phone]       Fax: [phone]   RxID:   [id number]

## 2010-05-21 NOTE — Progress Notes (Signed)
Summary: Lasix?  Phone Note Call from Patient Call back at [facility] Phone [phone]   Caller: Patient Summary of Call: pt called stating that he has increase swelling of his legs. pt would like to take Lasix two times a day instead of once daily until swelling has gone. please advise Initial call taken by: Margaret Pyle, CMA,  Aug 21, 2009 10:13 AM  Follow-up for Phone Call        may inc Lasix 40mg  tabs to two times a day x 3 days, then resume once daily dosing - if swelling not improved with this change, needs OV to eval same - thanks Follow-up by: Newt Lukes MD,  Aug 21, 2009 12:10 PM  Additional Follow-up for Phone Call Additional follow up Details #1::        pt informed Additional Follow-up by: Margaret Pyle, CMA,  Aug 21, 2009 1:19 PM     Appended Document: Lasix? pt advised to take 1 extra potassium while taking 1 extra furosemide per VAL

## 2010-05-21 NOTE — Consult Note (Signed)
Summary: [facility]ation [facility]  [facility]ation Report   Imported By: Roderic Ovens 10/18/2008 12:59:24  _____________________________________________________________________  External Attachment:    Type:   Image     Comment:   External Document

## 2010-05-21 NOTE — Assessment & Plan Note (Signed)
Summary: EPH/ GD   CC:  fatigue.  History of Present Illness: Pleasant gentleman recently admitted to [facility] with a non-ST elevation myocardial infarction. Cardiac severe coronary disease with normal LV function. He does have significant COPD and he was discharged for pulmonary tuneup prior to coronary artery bypass and graft. His surgery was performed on July 16. He had left internal mammary artery to distal left anterior descending, saphenous vein graft to first circumflex marginal   branch with Y graft sequential to left posterolateral branch, saphenous  vein graft to distal right coronary artery. The patient did have brief postoperative atrial fibrillation controlled with amiodarone. He also had a mild ileus that resolved. Note a preoperative echocardiogram showed normal LV function without significant aortic valve disease. Since discharge he has had mild dyspnea on exertion but no orthopnea, PND, pedal edema, palpitations, chest pain or syncope. He does have some fatigue.  Current Medications (verified): 1)  Aspirin 81 Mg Tbec (Aspirin) .... Take One Tablet By Mouth Daily 2)  Plavix 75 Mg Tabs (Clopidogrel Bisulfate) .... Take One Tablet By Mouth Daily 3)  Amlodipine Besylate 5 Mg Tabs (Amlodipine Besylate) .... Take One Tablet By Mouth Daily 4)  Metoprolol Tartrate 25 Mg Tabs (Metoprolol Tartrate) .... Take One Tablet By Mouth Twice A Day 5)  Furosemide 40 Mg Tabs (Furosemide) .... Take One Tablet By Mouth Daily. 6)  Potassium Chloride Crys Cr 20 Meq Cr-Tabs (Potassium Chloride Crys Cr) .[name].. 1 Tab By Mouth Once Daily 7)  Wellbutrin Xl 150 Mg Xr24h-Tab (Bupropion Hcl) .[name].. 1 Tab By Mouth Once Daily 8)  Lipitor 40 Mg Tabs (Atorvastatin Calcium) .... Take One Tablet By Mouth Daily. 9)  Mucinex 600 Mg Xr12h-Tab (Guaifenesin) .[name].. 1 Tab By Mouth Two Times A Day 10)  Duoneb 0.5-2.5 (3) Mg/36ml Soln (Ipratropium-Albuterol) .... As Directed 11)  Amiodarone Hcl 400 Mg Tabs (Amiodarone  Hcl) .... 400mg  Two Times Daily For 1 Week Then 200mg  Two Times A Day 12)  Oxycodone-Acetaminophen 5-500 Mg Caps (Oxycodone-Acetaminophen) .... As Needed 13)  Imdur 30 Mg Xr24h-Tab (Isosorbide Mononitrate) .[name].. 1 Tab By Mouth Once Daily 14)  Pulmicort Flexhaler 180 Mcg/act Aepb (Budesonide) .... 2 Puffs 15)  Albuterol Sulfate (2.5 Mg/74ml) 0.083% Nebu (Albuterol Sulfate) .... 2 Puffs As Needed 16)  Nitroglycerin 0.4 Mg Subl (Nitroglycerin) .... One Tablet Under Tongue Every 5 Minutes As Needed For Chest Pain---May Repeat Times Three 17)  Ativan 0.5 Mg Tabs (Lorazepam) .... As Needed  Past History:  Past Medical History: Current Problems:  HYPERTENSION (ICD-401.9) CAD (ICD-414.00) HYPERLIPIDEMIA (ICD-272.4) MYOCARDIAL INFARCTION (ICD-410.90) ANEMIA (ICD-285.9) COPD (ICD-496) ILEUS (ICD-560.1)  Past Surgical History: Reviewed history from 11/13/2008 and no changes required.  Coronary artery bypass grafting x4 (left internal mammary artery to the distal left anterior descending, saphenous vein graft to the first circumflex marginal branch with a Y graft sequentially to a eft posterolateral branch, saphenous vein graft to the distal  ight coronary artery).   Social History: Reviewed history from 11/13/2008 and no changes required.  The patient is widowed, and lives alone here in   [place].  He has 1 son, who lives in the [place], and he has   an uncle, who lives nearby as well.  He smokes in excess of 1 pack of   cigarettes per day.  The patient reports a previous history of heavy   alcohol use, but he quit drinking many years ago.   Review of Systems       no fevers  or chills, productive cough, hemoptysis, dysphasia, odynophagia, melena, hematochezia, dysuria, hematuria, rash, seizure activity, orthopnea, PND, pedal edema, claudication. Remaining systems are negative.   Vital Signs:  Patient profile:   70 year old male Height:      71 inches Weight:      194 pounds  BMI:     27.16 Pulse rate:   44 / minute Resp:     12 per minute BP sitting:   118 / 80  (left arm)  Vitals Entered By: Kem Parkinson (November 14, 2008 10:02 AM)  Physical Exam  General:  well-developed well-nourished in no acute distress. Skin is warm and dry. Head:  HEENT is normal. Neck:  supple with no thyromegaly noted. Chest Wall:  sternotomy without evidence of infection. Lungs:  diminished breath sounds throughout. Heart:  bradycardic rate and regular rhythm; 2/6 systolic murmur left sternal border. Abdomen:  soft and nontender. No masses palpated. Extremities:  no edema. Neurologic:  grossly intact.   EKG  Procedure date:  11/14/2008  Findings:      Marked sinus bradycardia at a rate of 44. Axis normal. Nonspecific ST changes. Prolonged QT interval.  Impression & Recommendations:  Problem # 1:  HYPERTENSION (ICD-401.9)  Blood pressure controlled on present medications. However heart rate in the 40s. Decrease metoprolol to 12.5 mg p.o. b.i.d. His updated medication list for this problem includes:    Aspirin 81 Mg Tbec (Aspirin) .[name]... Take one tablet by mouth daily    Amlodipine Besylate 5 Mg Tabs (Amlodipine besylate) .[name]... Take one tablet by mouth daily    Metoprolol Tartrate 25 Mg Tabs (Metoprolol tartrate) .[name]... Take one half tablet by mouth twice a day    Furosemide 40 Mg Tabs (Furosemide) .[name]... Take one tablet by mouth daily.  His updated medication list for this problem includes:    Aspirin 81 Mg Tbec (Aspirin) .[name]... Take one tablet by mouth daily    Amlodipine Besylate 5 Mg Tabs (Amlodipine besylate) .[name]... Take one tablet by mouth daily    Metoprolol Tartrate 25 Mg Tabs (Metoprolol tartrate) .[name]... Take one tablet by mouth twice a day    Furosemide 40 Mg Tabs (Furosemide) .[name]... Take one tablet by mouth daily.  Problem # 2:  CAD (ICD-414.00)  Continue aspirin, Plavix, beta blocker and statin. His updated medication list for this problem includes:     Aspirin 81 Mg Tbec (Aspirin) .[name]... Take one tablet by mouth daily    Plavix 75 Mg Tabs (Clopidogrel bisulfate) .[name]... Take one tablet by mouth daily    Amlodipine Besylate 5 Mg Tabs (Amlodipine besylate) .[name]... Take one tablet by mouth daily    Metoprolol Tartrate 25 Mg Tabs (Metoprolol tartrate) .[name]... Take one half tablet by mouth twice a day    Nitroglycerin 0.4 Mg Subl (Nitroglycerin) ..... One tablet under tongue every 5 minutes as needed for chest pain---may repeat times three  His updated medication list for this problem includes:    Aspirin 81 Mg Tbec (Aspirin) .[name]... Take one tablet by mouth daily    Plavix 75 Mg Tabs (Clopidogrel bisulfate) .[name]... Take one tablet by mouth daily    Amlodipine Besylate 5 Mg Tabs (Amlodipine besylate) .[name]... Take one tablet by mouth daily    Metoprolol Tartrate 25 Mg Tabs (Metoprolol tartrate) .[name]... Take one tablet by mouth twice a day    Imdur 30 Mg Xr24h-tab (Isosorbide mononitrate) .[name]... 1 tab by mouth once daily    Nitroglycerin 0.4 Mg Subl (Nitroglycerin) ..... One tablet under tongue  every 5 minutes as needed for chest pain---may repeat times three  Problem # 3:  HYPERLIPIDEMIA (ICD-272.4) Continue Lipitor. Check lipids and liver when I see him back in 8 weeks period His updated medication list for this problem includes:    Lipitor 40 Mg Tabs (Atorvastatin calcium) .[name]... Take one tablet by mouth daily.  Problem # 4:  ATRIAL FIBRILLATION (ICD-427.31)  The patient had postoperative atrial fibrillation. Decrease amiodarone to 200 mg p.o. daily. Will discontinue this medication in 8 weeks if he remains in sinus rhythm. His updated medication list for this problem includes:    Aspirin 81 Mg Tbec (Aspirin) .[name]... Take one tablet by mouth daily    Plavix 75 Mg Tabs (Clopidogrel bisulfate) .[name]... Take one tablet by mouth daily    Metoprolol Tartrate 25 Mg Tabs (Metoprolol tartrate) .[name]... Take one half tablet by mouth twice a day    Amiodarone Hcl 400 Mg Tabs  (Amiodarone hcl) ..... 200mg  one tablet once daily  His updated medication list for this problem includes:    Aspirin 81 Mg Tbec (Aspirin) .[name]... Take one tablet by mouth daily    Plavix 75 Mg Tabs (Clopidogrel bisulfate) .[name]... Take one tablet by mouth daily    Metoprolol Tartrate 25 Mg Tabs (Metoprolol tartrate) .[name]... Take one tablet by mouth twice a day    Amiodarone Hcl 400 Mg Tabs (Amiodarone hcl) ..... 400mg  two times daily for 1 week then 200mg  two times a day  Problem # 5:  COPD (ICD-496)  Continue present medications. He has discontinued his tobacco use. His updated medication list for this problem includes:    Duoneb 0.5-2.5 (3) Mg/62ml Soln (Ipratropium-albuterol) .[name]... As directed    Pulmicort Flexhaler 180 Mcg/act Aepb (Budesonide) .[name]... 2 puffs    Albuterol Sulfate (2.5 Mg/42ml) 0.083% Nebu (Albuterol sulfate) .[name]... 2 puffs as needed  His updated medication list for this problem includes:    Duoneb 0.5-2.5 (3) Mg/87ml Soln (Ipratropium-albuterol) .[name]... As directed    Pulmicort Flexhaler 180 Mcg/act Aepb (Budesonide) .[name]... 2 puffs    Albuterol Sulfate (2.5 Mg/21ml) 0.083% Nebu (Albuterol sulfate) .[name]... 2 puffs as needed  Patient Instructions: 1)  Your physician recommends that you schedule a follow-up appointment in: 8 WEEKS 2)  Your physician has recommended you make the following change in your medication: STOP ISOSORBIDE(IMDUR) 3)  DECREASE METOPROLOL TO ONE HALF TABLET TWICE DAILY 4)  DECREASE AMIODARONE TO 200MG  ONCE DAILY

## 2010-05-21 NOTE — Progress Notes (Signed)
Summary: OTC med  Phone Note Call from Patient Call back at [facility] Phone [phone]   Caller: Patient Summary of Call: Pt called requesting MD advisement on OTC medication for sinus cocngestion that willnot interact with HTN meds.  Initial call taken by: Margaret Pyle, CMA,  November 27, 2009 9:51 AM  Follow-up for Phone Call        cordicidin hbp - take as directed on box - thx Follow-up by: Newt Lukes MD,  November 27, 2009 2:49 PM  Additional Follow-up for Phone Call Additional follow up Details #1::        pt informed Additional Follow-up by: Margaret Pyle, CMA,  November 27, 2009 2:51 PM

## 2010-05-21 NOTE — Progress Notes (Signed)
Summary: pain meds  Phone Note Outgoing Call   Summary of Call: please let pt know i have reviewed the situation re: his back and leg pain - at this time will try limited number of generic vicodin to use in place of tylenol on "bad days" - pls call in rx to his pharm as i have listed "historically" on his med list - also please let him know that next OV here we will be checking xrays of his back and hips to look for any other problems that may be contrib to causes for pain and may need to consider referral to orthopedics for asst on mgmt of this situation - thanks Initial call taken by: Newt Lukes MD,  July 13, 2009 12:58 PM  Follow-up for Phone Call        Patient notified. Follow-up by: Lucious Groves,  July 13, 2009 2:04 PM    New/Updated Medications: HYDROCODONE-ACETAMINOPHEN 5-500 MG TABS (HYDROCODONE-ACETAMINOPHEN) 1 by mouth two times a day as needed for pain HYDROCODONE-ACETAMINOPHEN 5-500 MG TABS (HYDROCODONE-ACETAMINOPHEN) 1 by mouth two times a day as needed for pain Prescriptions: HYDROCODONE-ACETAMINOPHEN 5-500 MG TABS (HYDROCODONE-ACETAMINOPHEN) 1 by mouth two times a day as needed for pain  #30 x 3   Entered by:   Lucious Groves   Authorized by:   Newt Lukes MD   Signed by:   Lucious Groves on 07/13/2009   Method used:   Telephoned to ...       [facility]  [id number] ([facility]       [address]       [place], [place]  [address]       Ph: [phone] or [phone]       Fax: [phone]   RxID:   [id number] HYDROCODONE-ACETAMINOPHEN 5-500 MG TABS (HYDROCODONE-ACETAMINOPHEN) 1 by mouth two times a day as needed for pain  #30 x 3   Entered and Authorized by:   Newt Lukes MD   Signed by:   Newt Lukes MD on 07/13/2009   Method used:   Historical   RxID:   [id number]

## 2010-05-21 NOTE — Progress Notes (Signed)
Summary: Ultracet  Phone Note Call from Patient Call back at Home Phone [phone]   Caller: Patient Summary of Call: pt called stating that Ultracet is not helping back pain. pt is requesting another type of medication. Initial call taken by: Margaret Pyle, CMA,  May 07, 2009 9:42 AM  Follow-up for Phone Call        try mobic - e-rx sent - if this not helping, will make referral to back specialist to help get to the bottom of causes for his pain -  please let pt know same - thanks Follow-up by: Newt Lukes MD,  May 07, 2009 11:29 AM  Additional Follow-up for Phone Call Additional follow up Details #1::        pt informed and will call back if no better Additional Follow-up by: Margaret Pyle, CMA,  May 07, 2009 1:34 PM    New/Updated Medications: MOBIC 15 MG TABS (MELOXICAM) 1 by mouth once daily for pain Prescriptions: MOBIC 15 MG TABS (MELOXICAM) 1 by mouth once daily for pain  #30 x 1   Entered and Authorized by:   Newt Lukes MD   Signed by:   Newt Lukes MD on 05/07/2009   Method used:   [facility] to        [id number]       [address]       [place], [place]  [address]       Ph: [phone] or [phone]       Fax: [phone]   RxID:   [id number]

## 2010-05-21 NOTE — Assessment & Plan Note (Signed)
Summary: 3-6 MTH FU   STC   Vital Signs:  Patient profile:   70 year old male Height:      71 inches (180.34 cm) Weight:      224.0 pounds (101.82 kg) O2 Sat:      97 % on Room air Temp:     97.6 degrees F (36.44 degrees C) oral Pulse rate:   57 / minute BP sitting:   130 / 72  (left arm) Cuff size:   large  Vitals Entered By: Orlan Leavens (July 11, 2009 4:33 PM)  O2 Flow:  Room air CC: 4 month follow-up Is Patient Diabetic? No Pain Assessment Patient in pain? no        Primary Care Provider:  Newt Lukes MD  CC:  4 month follow-up.  History of Present Illness: here for f/u 4 mo  1) HTN - reports compliance with ongoing medical treatment and no changes in medication dose or frequency. denies adverse side effects related to current therapy.   2) dyslipidemia - reports compliance with ongoing medical treatment and no changes in medication dose or frequency. denies adverse side effects related to current therapy.   3) CAD - s/p CABG 10/2008 reports compliance with ongoing medical treatment and no changes in medication dose or frequency. denies adverse side effects related to current therapy. 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 - pain was improved after taking percocet s/p CABG but caused "foggy head" - ?if any other pain pill he could take for bad days - tried ulracet from12/2010 with no relief has not pursued lift shoes or PT or other therapy since the 70s -  Clinical Review Panels:  Lipid Management   Cholesterol:  120 (03/28/2009)   LDL (bad choesterol):  70 (03/28/2009)   HDL (good cholesterol):  40.10 (03/28/2009)  Complete Metabolic Panel   Glucose:  131 (01/19/2009)   Sodium:  140 (01/19/2009)   Potassium:  4.1 (01/19/2009)   Chloride:  107 (01/19/2009)   CO2:  26 (01/19/2009)   BUN:  18 (01/19/2009)   Creatinine:  0.9 (01/19/2009)   Albumin:  3.9 (03/28/2009)   Total  Protein:  8.1 (03/28/2009)   Calcium:  9.0 (01/19/2009)   Total Bili:  1.6 (03/28/2009)   Alk Phos:  66 (03/28/2009)   SGPT (ALT):  34 (03/28/2009)   SGOT (AST):  33 (03/28/2009)   Current Medications (verified): 1)  Aspirin 81 Mg Tbec (Aspirin) .... Take One Tablet By Mouth Daily 2)  Metoprolol Tartrate 25 Mg Tabs (Metoprolol Tartrate) .... Take One Half Tablet By Mouth Twice A Day 3)  Furosemide 40 Mg Tabs (Furosemide) .... Take One Tablet By Mouth Daily. 4)  Potassium Chloride Crys Cr 20 Meq Cr-Tabs (Potassium Chloride Crys Cr) .... One Tablet By Mouth Twice Daily 5)  Wellbutrin Xl 150 Mg Xr24h-Tab (Bupropion Hcl) .[name].. 1 Tab By Mouth Once Daily 6)  Lipitor 40 Mg Tabs (Atorvastatin Calcium) .... Take One Tablet By Mouth Daily. 7)  Mucinex 600 Mg Xr12h-Tab (Guaifenesin) .[name].. 1 Tab By Mouth Two Times A Day 8)  Albuterol Sulfate (2.5 Mg/65ml) 0.083% Nebu (Albuterol Sulfate) .... 2 Puffs As Needed 9)  Nitroglycerin 0.4 Mg Subl (Nitroglycerin) .... One Tablet Under Tongue Every 5 Minutes As Needed For Chest Pain---May Repeat Times Three 10)  Ambien 5 Mg Tabs (Zolpidem Tartrate) .... Take 1 Tab By Mouth At Bedtime As Needed For  Sleep 11)  Amlodipine Besylate 10 Mg Tabs (Amlodipine Besylate) .... One By Mouth Daily 12)  Niaspan 500 Mg Cr-Tabs (Niacin (Antihyperlipidemic)) .... 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  The patient denies weight loss and chest pain.    Physical Exam  General:  alert, well-developed, well-nourished, and cooperative to examination.    Lungs:  normal respiratory effort, no intercostal retractions or use of  accessory muscles; normal breath sounds bilaterally - no crackles and no wheezes.    Heart:  regular rate and rhythm with 2/6 systolic murmur left sternal border. Radiates to the carotids. mild LLE edema as compared to RLE (chronic) Abdomen:  soft, non-tender, normal bowel sounds, no distention; no masses and no appreciable hepatomegaly or splenomegaly.   Msk:  No deformity or scoliosis noted of thoracic or lumbar spine.  notable LLE shortening compared to RLE which affects gait   Impression & Recommendations:  Problem # 1:  HYPERTENSION (ICD-401.9) Assessment Unchanged  His updated medication list for this problem includes:    Metoprolol Tartrate 25 Mg Tabs (Metoprolol tartrate) .[name]... Take one half tablet by mouth twice a day    Furosemide 40 Mg Tabs (Furosemide) .[name]... Take one tablet by mouth daily.    Amlodipine Besylate 10 Mg Tabs (Amlodipine besylate) ..... One by mouth daily  BP today: 130/72 Prior BP: 132/70 (04/11/2009)  Labs Reviewed: K+: 4.1 (01/19/2009) Creat: : 0.9 (01/19/2009)   Chol: 120 (03/28/2009)   HDL: 40.10 (03/28/2009)   LDL: 70 (03/28/2009)   TG: 49.0 (03/28/2009)  Problem # 2:  HYPERLIPIDEMIA (ICD-272.4) Assessment: Unchanged  His updated medication list for this problem includes:    Lipitor 40 Mg Tabs (Atorvastatin calcium) .[name]... Take one tablet by mouth daily.    Niaspan 500 Mg Cr-tabs (Niacin (antihyperlipidemic)) .[name]..[name] Two tablets by mouth once daily  Labs Reviewed: SGOT: 33 (03/28/2009)   SGPT: 34 (03/28/2009)   HDL:40.10 (03/28/2009), 35.90 (01/08/2009)  LDL:70 (03/28/2009), 86 (01/08/2009)  Chol:120 (03/28/2009), 136 (01/08/2009)  Trig:49.0 (03/28/2009), 70.0 (01/08/2009)  Problem # 3:  CAD (ICD-414.00) Assessment: Unchanged  His updated medication list for this problem includes:    Aspirin 81 Mg Tbec (Aspirin) .[name]... Take one tablet by mouth daily    Metoprolol Tartrate 25 Mg Tabs (Metoprolol tartrate) .[name]... Take one half tablet by mouth twice a  day    Furosemide 40 Mg Tabs (Furosemide) .[name]... Take one tablet by mouth daily.    Nitroglycerin 0.4 Mg Subl (Nitroglycerin) ..... One tablet under tongue every 5 minutes as needed for chest pain---may repeat times three    Amlodipine Besylate 10 Mg Tabs (Amlodipine besylate) ..... 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 Take one tablet by mouth daily 2)  Metoprolol Tartrate 25 Mg Tabs (Metoprolol tartrate) .... Take one half tablet by mouth twice a day 3)  Furosemide 40 Mg Tabs (Furosemide) .... Take one tablet by mouth daily. 4)  Potassium Chloride Crys Cr 20 Meq Cr-tabs (Potassium chloride crys cr) .... One tablet by mouth twice daily 5)  Wellbutrin Xl 150 Mg Xr24h-tab (Bupropion hcl) .[name].. 1 tab by mouth once daily 6)  Lipitor 40 Mg Tabs (Atorvastatin calcium) .... Take one tablet by mouth daily. 7)  Mucinex 600 Mg Xr12h-tab (Guaifenesin) .[name].. 1 tab by mouth two times a day 8)  Albuterol Sulfate (2.5 Mg/39ml) 0.083% Nebu  (Albuterol sulfate) .... 2 puffs as needed 9)  Nitroglycerin 0.4 Mg Subl (Nitroglycerin) .... One tablet under tongue every 5 minutes as needed for chest pain---may repeat times three 10)  Ambien 5 Mg Tabs (Zolpidem tartrate) .... Take 1 tab by mouth at bedtime as needed for sleep 11)  Amlodipine Besylate 10 Mg Tabs (Amlodipine besylate) .... One by mouth daily 12)  Niaspan 500 Mg Cr-tabs (Niacin (antihyperlipidemic)) .... Two tablets by mouth once daily  Patient Instructions: 1)  it was good to see you today. 2)  no medication changes today - 3)  will review and consider what other medication or alternatives are availble to help treat your back pain symptoms - 4)  Please schedule a follow-up appointment in 6 months, sooner if problems.

## 2010-05-21 NOTE — Miscellaneous (Signed)
[facility]: [facility]   Imported By: Roderic Ovens 11/28/2008 12:21:33  _____________________________________________________________________  External Attachment:    Type:   Image     Comment:   External Document

## 2010-05-21 NOTE — Progress Notes (Signed)
Summary: pain, request for medications  Phone Note Other Incoming   Caller: pt  Summary of Call: Pt went to pick up his meloxicam from the pharm and red the drug insert for the medication. He said it indicated if pt had any bypass heart surg or heart disease that it was unsafe to take medication. Pt is uncomfortable taking medication and would like another alternative. Please advise. Initial call taken by: Ami Bullins CMA,  May 08, 2009 8:57 AM  Follow-up for Phone Call        please let pt know i have referred him to a back specialist for further evaluation and treatment of his pain (no other medications from me for this problem at this time as i do not prescribe narcotics for long term chronic pain managment) - thanks Follow-up by: Newt Lukes MD,  May 08, 2009 9:12 AM  Additional Follow-up for Phone Call Additional follow up Details #1::        pt states that he can not afford to see a specialist at this time because of his Insurance. I advised pt to contact his Insurance co to find out what avenue is available to him. pt agreed and will call back if necessary. Additional Follow-up by: Margaret Pyle, CMA,  May 08, 2009 10:25 AM  New Problems: BACK PAIN (ICD-724.5)   Additional Follow-up for Phone Call Additional follow up Details #2::    ok - thanks; will cancel referral at this time Follow-up by: Newt Lukes MD,  May 08, 2009 11:00 AM  Additional Follow-up for Phone Call Additional follow up Details #3:: Details for Additional Follow-up Action Taken: called spine and Scoliosis specialist  cancelled referral Shelbie Proctor  May 08, 2009 11:08 AM Additional Follow-up by: Shelbie Proctor,  May 18, 2009 4:34 PM  New Problems: BACK PAIN (ICD-724.5)

## 2010-05-21 NOTE — Miscellaneous (Signed)
Summary: refill niaspan  Clinical Lists Changes  Medications: Changed medication from NIASPAN 500 MG CR-TABS (NIACIN (ANTIHYPERLIPIDEMIC)) one tablet by mouth once daily x 4 weeks then increase to two tablets once daily to NIASPAN 500 MG CR-TABS (NIACIN (ANTIHYPERLIPIDEMIC)) two tablets by mouth once daily - Signed Rx of NIASPAN 500 MG CR-TABS (NIACIN (ANTIHYPERLIPIDEMIC)) two tablets by mouth once daily;  #60 x 12;  Signed;  Entered by: Deliah Goody, RN;  Authorized by: Ferman Hamming, MD, [facility];  Method used: [facility]  [address] [id number] [address], [place], [place]  [address], Ph: [phone] or [phone], Fax: [phone]    Prescriptions: NIASPAN 500 MG CR-TABS (NIACIN (ANTIHYPERLIPIDEMIC)) two tablets by mouth once daily  #60 x 12   Entered by:   Deliah Goody, RN   Authorized by:   Ferman Hamming, MD, [facility]   Signed by:   Deliah Goody, RN on 02/06/2009   Method used:   [facility]        [facility]  [address] [id number]       [address]       [place], [place]  [address]       Ph: [phone] or [phone]       Fax: [phone]   RxID:   [id number]

## 2010-05-21 NOTE — Letter (Signed)
Summary: [facility]  [facility]   Imported By: Roderic Ovens 01/17/2009 14:06:54  _____________________________________________________________________  External Attachment:    Type:   Image     Comment:   External Document

## 2010-05-21 NOTE — Progress Notes (Signed)
Summary: pneumonia shot  Phone Note Call from Patient Call back at Home Phone [phone]   Caller: Patient Reason for Call: Talk to Nurse Summary of Call: needs prescripition for pneumonia shot Initial call taken by: Migdalia Dk,  February 15, 2009 8:24 AM  Follow-up for Phone Call        spoke with pt, [facility] vacc. he can get the shot at [facility] but they require a script. will place in the mail today Deliah Goody, RN  February 15, 2009 9:48 AM

## 2010-05-21 NOTE — Progress Notes (Signed)
Summary: sore throat  Phone Note Call from Patient Call back at Home Phone [phone]   Caller: Patient Summary of Call: pt called stating that after leaving office yesterday he got home and started developing a sore throat. pt would like MD to send Rx for this or maybe recommend OTC medication. Initial call taken by: Margaret Pyle, CMA,  April 12, 2009 8:48 AM  Follow-up for Phone Call        try chloraseptic spray or lozenges - also use  tylenol cold and sinus if any congestion in head or chest - if developing fever, should be re-eval with OV but most likely viral cause and unlikely to need abx - thanks Follow-up by: Newt Lukes MD,  April 12, 2009 11:03 AM  Additional Follow-up for Phone Call Additional follow up Details #1::        pt informed of MD's recommendation. Additional Follow-up by: Margaret Pyle, CMA,  April 12, 2009 11:34 AM

## 2010-05-21 NOTE — Progress Notes (Signed)
Summary: Niaspan?  Phone Note Call from Patient Call back at [facility] Phone [phone]   Caller: Patient Summary of Call: Pt called stating that he read somewhere that Niaspan was not as effective as it used to be. Pt would like to know if this is true and does he need to continue taking it if it is not needed. please advise Initial call taken by: Margaret Pyle, CMA,  Sep 14, 2009 2:45 PM  Follow-up for Phone Call        i believe this medication is effective and do not recommend any change Follow-up by: Newt Lukes MD,  Sep 14, 2009 3:38 PM  Additional Follow-up for Phone Call Additional follow up Details #1::        pt informed Additional Follow-up by: Margaret Pyle, CMA,  Sep 14, 2009 3:46 PM

## 2010-05-21 NOTE — Progress Notes (Signed)
Summary: cold meds  Phone Note Call from Patient Call back at Home Phone [phone]   Caller: Patient Reason for Call: Talk to Nurse Summary of Call: needs to know what kind of meds he can take for a head cold, may leave info on voicemail Initial call taken by: Migdalia Dk,  March 02, 2009 2:22 PM  Follow-up for Phone Call        Spoke with patient- Pt. would like to know what to take  to relieve the symptoms of head cold. He is afreid to just take anything because of all the heart medication he is taken. RN advised pt. to take cold medication without dicongestant like [place]. Pt. verbalized understanding. Ollen Gross, RN, BSN  March 02, 2009 2:32 PM

## 2010-05-21 NOTE — Assessment & Plan Note (Signed)
Summary: PER CHECK OUT/SF   History of Present Illness: Pleasant gentleman recently admitted to [facility] with a non-ST elevation myocardial infarction. Cardiac cath revealed severe coronary disease with normal LV function. He does have significant COPD and he was discharged for pulmonary tuneup prior to coronary artery bypass and graft. His surgery was performed on November 03, 2008. He had left internal mammary artery to distal left anterior descending, saphenous vein graft to first circumflex marginal   branch with Y graft sequential to left posterolateral branch, saphenous  vein graft to distal right coronary artery. The patient did have brief postoperative atrial fibrillation controlled with amiodarone. Note a preoperative echocardiogram showed normal LV function without significant aortic valve disease. Also note preoperative carotid Dopplers revealed a 40-59% left stenosis and no right stenosis. I last saw him in July of this year. Since then he has some dyspnea on exertion relieved with rest. There is no orthopnea or PND. He did have pedal edema that has improved with diuretics. He does not have exertional chest pain. He occasionally feels a pain when he sneezes or coughs. It does not radiate and there is no associated symptoms.  Preventive Screening-Counseling & Management  Alcohol-Tobacco     Smoking Status: quit  Current Medications (verified): 1)  Aspirin 81 Mg Tbec (Aspirin) .... Take One Tablet By Mouth Daily 2)  Plavix 75 Mg Tabs (Clopidogrel Bisulfate) .... Take One Tablet By Mouth Daily 3)  Amlodipine Besylate 5 Mg Tabs (Amlodipine Besylate) .... Take One Tablet By Mouth Daily 4)  Metoprolol Tartrate 25 Mg Tabs (Metoprolol Tartrate) .... Take One Half Tablet By Mouth Twice A Day 5)  Furosemide 40 Mg Tabs (Furosemide) .... Take One Tablet By Mouth Daily. 6)  Potassium Chloride Crys Cr 20 Meq Cr-Tabs (Potassium Chloride Crys Cr) .[name].. 1 Tab By Mouth Once Daily 7)  Wellbutrin Xl  150 Mg Xr24h-Tab (Bupropion Hcl) .[name].. 1 Tab By Mouth Once Daily 8)  Lipitor 40 Mg Tabs (Atorvastatin Calcium) .... Take One Tablet By Mouth Daily. 9)  Mucinex 600 Mg Xr12h-Tab (Guaifenesin) .[name].. 1 Tab By Mouth Two Times A Day 10)  Amiodarone Hcl 400 Mg Tabs (Amiodarone Hcl) .... 200mg  One Tablet Once Daily 11)  Oxycodone-Acetaminophen 5-500 Mg Caps (Oxycodone-Acetaminophen) .... As Needed 12)  Albuterol Sulfate (2.5 Mg/70ml) 0.083% Nebu (Albuterol Sulfate) .... 2 Puffs As Needed 13)  Nitroglycerin 0.4 Mg Subl (Nitroglycerin) .... One Tablet Under Tongue Every 5 Minutes As Needed For Chest Pain---May Repeat Times Three 14)  Ativan 0.5 Mg Tabs (Lorazepam) .... As Needed 15)  Ambien 5 Mg Tabs (Zolpidem Tartrate) .... 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  Coronary artery bypass grafting x4 (left internal mammary artery to the distal left anterior descending, saphenous vein graft to the first circumflex marginal branch with a Y graft sequentially to a eft posterolateral branch, saphenous vein graft to the distal  ight coronary artery).   Social History: Reviewed history from 11/13/2008 and no changes required.  The patient is widowed, and lives alone here in   [place].  He has 1 son, who lives in the [place], and he has   an uncle, who lives nearby as well.  The patient reports  a previous history of heavy   alcohol use, but he quit drinking many years ago.  Tobacco Use - Former.  Smoking Status:  quit  Review of Systems       Patient complains of some fatigue but no fevers or chills, productive  cough, hemoptysis, dysphasia, odynophagia, melena, hematochezia, dysuria, hematuria, rash, seizure activity, orthopnea, PND, pedal edema, claudication. Remaining systems are negative.   Vital Signs:  Patient profile:   70 year old male Height:      71 inches Weight:      207 pounds BMI:     28.97 Pulse rate:   59 / minute Resp:     16 per minute BP sitting:   166 / 70  (right arm)  Vitals Entered By: Marrion Coy, CNA (January 08, 2009 8:32 AM)  Physical Exam  General:  well-developed well-nourished in no acute distress. Skin is warm and dry. Head:  HEENT is normal. Neck:  supple with no thyromegaly. Chest Wall:  sternotomy without evidence of infection. Lungs:  diffuse mild rhonchi Heart:  regular rate and rhythm with 2/6 systolic murmur left sternal border. Radiates to the carotids. Abdomen:  soft and nontender. Question pulsatile mass on examination. Soft bruit noted. Extremities:  trace edema. Neurologic:  grossly intact.   Impression & Recommendations:  Problem # 1:  ABDOMINAL BRUIT (ICD-785.9) Check abdominal ultrasound to exclude aneurysm. Orders: Abdominal Aorta Duplex (Abd Aorta Duplex)  Problem # 2:  HYPERTENSION (ICD-401.9) Blood pressure elevated. Increase amlodipine to 10 mg p.o. daily. The following medications were removed from the medication list:    Amlodipine Besylate 5 Mg Tabs (Amlodipine besylate) .[name]... Take one tablet by mouth daily His updated medication list for this problem includes:    Aspirin 81 Mg Tbec (Aspirin) .[name]... Take one tablet by mouth daily    Metoprolol Tartrate 25 Mg Tabs (Metoprolol tartrate) .[name]... Take one half tablet by mouth twice a day    Furosemide 40 Mg Tabs (Furosemide) .[name]... Take one tablet by mouth daily.    Amlodipine Besylate 10 Mg Tabs (Amlodipine besylate) ..... One by mouth daily  Orders: TLB-BMP (Basic Metabolic Panel-BMET) (80048-METABOL)  Problem # 3:  ATRIAL FIBRILLATION (ICD-427.31) Patient has had no further  atrial fibrillation. I believe previous rhythm disturbance was related to cardiac surgery. We'll discontinue amiodarone. The following medications were removed from the medication list:    Plavix 75 Mg Tabs (Clopidogrel bisulfate) .[name]... Take one tablet by mouth daily    Amiodarone Hcl 400 Mg Tabs (Amiodarone hcl) ..... 200mg  one tablet once daily His updated medication list for this problem includes:    Aspirin 81 Mg Tbec (Aspirin) .[name]... Take one tablet by mouth daily    Metoprolol Tartrate 25 Mg Tabs (Metoprolol tartrate) .[name]... Take one half tablet by mouth twice a day  Problem # 4:  CAD (ICD-414.00) Continue aspirin but discontinue Plavix. Continue beta blocker and statin. The following medications were removed from the medication list:    Plavix 75 Mg Tabs (Clopidogrel bisulfate) .[name]... Take one tablet by mouth daily    Amlodipine Besylate 5 Mg Tabs (Amlodipine besylate) .[name]... Take one tablet by mouth daily His updated medication list for this problem includes:    Aspirin 81 Mg Tbec (Aspirin) .[name]... Take one tablet by mouth daily    Metoprolol Tartrate 25 Mg Tabs (Metoprolol tartrate) .[name]... Take one half tablet by mouth twice a day    Nitroglycerin 0.4 Mg Subl (Nitroglycerin) ..... One tablet under tongue every 5 minutes as  needed for chest pain---may repeat times three    Amlodipine Besylate 10 Mg Tabs (Amlodipine besylate) ..... One by mouth daily  Problem # 5:  HYPERLIPIDEMIA (ICD-272.4) Continue statin. Check lipids and liver. We'll also check bmet. His updated medication list for this problem includes:    Lipitor 40 Mg Tabs (Atorvastatin calcium) .[name]... Take one tablet by mouth daily.  Orders: TLB-BMP (Basic Metabolic Panel-BMET) (80048-METABOL) TLB-Lipid Panel (80061-LIPID) TLB-Hepatic/Liver Function Pnl (80076-HEPATIC)  Problem # 6:  COPD (ICD-496)  The following medications were removed from the medication list:    Duoneb 0.5-2.5 (3) Mg/32ml Soln (Ipratropium-albuterol) .[name]...  As directed    Pulmicort Flexhaler 180 Mcg/act Aepb (Budesonide) .[name]... 2 puffs His updated medication list for this problem includes:    Albuterol Sulfate (2.5 Mg/36ml) 0.083% Nebu (Albuterol sulfate) .[name]... 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 Followup carotid Dopplers in June of 2011. The following medications were removed from the medication list:    Plavix 75 Mg Tabs (Clopidogrel bisulfate) .[name]... Take one tablet by mouth daily His updated medication list for this problem includes:    Aspirin 81 Mg Tbec (Aspirin) .[name]... Take one tablet by mouth daily  Other Orders: Internal Medicine Referral (Internal)  Patient Instructions: 1)  Your physician recommends that you schedule a follow-up appointment in:  6 months with Dr Jens Som 2)  Your physician recommends that you return for lab work today for lipid/liver and basic metabolic panel. 3)  Your physician has recommended you make the following change in your medication: Stop Amiodarone, stop plavix and increase Norvasc to 10mg  a day 4)  Your physician has requested that you have an abdominal aorta duplex. During this test, an ultrasound is used to evaluate the aorta. Allow 30 minutes for this exam. Do not eat after midnight the day before and avoid carbonated beverages. There are no restrictions or special instructions.  This is to rule out aneurysm 5)  You have been referred to primary MD Prescriptions: AMLODIPINE BESYLATE 10 MG TABS (AMLODIPINE BESYLATE) one by mouth daily  #30 x 11   Entered by:   Charolotte Capuchin, RN   Authorized by:   Ferman Hamming, MD, [facility]   Signed by:   Charolotte Capuchin, RN on 01/08/2009   Method used:   [facility]  [address] [id number]       [address]       [place], [place]  [address]       Ph: [phone] or [phone]       Fax: [phone]   RxID:   [id number]

## 2010-05-21 NOTE — Progress Notes (Signed)
Summary: Question about medication  Phone Note Call from Patient Call back at Home Phone [phone]   Caller: Patient Summary of Call: Pt have question about his medications Initial call taken by: Judie Grieve,  November 20, 2009 8:11 AM  Follow-up for Phone Call        PT STATED  PICKED UP MEDS AND NOTED GETTNG NIASPAN WITH PURCHASE PER PT NOT NEEDED AT THIS TIME.INFORMED NOT SURE WIHY WAS FILLED PT HAS PLENTY OF MEDS

## 2010-05-23 NOTE — Progress Notes (Signed)
Summary: ALT RX?   Phone Note Call from Patient Call back at [facility] Phone [phone]   Summary of Call: Pt has had itching & rash when taking hydrocodone. Tylenol by itself has not helped. Patient is requesting alt pain med.  Initial call taken by: Lamar Sprinkles, CMA,  May 15, 2010 3:20 PM  Follow-up for Phone Call        pt has been on hydrocodone w/o problems for long time... are we sure that med is the cause of the problem? try taking benadryl 25mg  every 4 hour as needed for itch and rash symptoms -  no pain med substitution at this time until we have more information Follow-up by: Newt Lukes MD,  May 15, 2010 4:23 PM  Additional Follow-up for Phone Call Additional follow up Details #1::        several attempts, number busy. Margaret Pyle, CMA  May 15, 2010 4:43 PM   I'm sorry, I left out - he has been taking med as little as possible, last rx 12/2009. Recently he has had itching all over only when he takes med. He feels this is a new sensitivity to med. Should he try benadryl at same time as hydrocodone to lessen or stop reaction? Additional Follow-up by: Lamar Sprinkles, CMA,  May 15, 2010 6:44 PM    Additional Follow-up for Phone Call Additional follow up Details #2::    yes - try benadryl with hydrocodone use - if still itch/rash using both, call back and we can try different med - thx Newt Lukes MD  May 16, 2010 8:18 AM   Spoke w/pt, he increase to taking 2 tabs at once due to pain. Benadryl at same time has not helped. Advised office visit, he agreed and scheduled apt Monday. Follow-up by: Lamar Sprinkles, CMA,  May 16, 2010 6:52 PM

## 2010-05-29 NOTE — Assessment & Plan Note (Signed)
Summary: discuss pain med/SD   Vital Signs:  Patient profile:   70 year old male Height:      71 inches (180.34 cm) Weight:      225.12 pounds (102.33 kg) O2 Sat:      96 % on Room air Temp:     97.4 degrees F (36.33 degrees C) oral Pulse rate:   69 / minute BP sitting:   122 / 82  (left arm) Cuff size:   regular  Vitals Entered By: Orlan Leavens RMA (May 20, 2010 1:14 PM)  O2 Flow:  Room air CC: ? allergic reaction to hydrocodone. Broke out in a rash on both arms x's 1 week Is Patient Diabetic? No Pain Assessment Patient in pain? no        Primary Care Provider:  Newt Lukes MD  CC:  ? allergic reaction to hydrocodone. Broke out in a rash on both arms x's 1 week.  History of Present Illness: here for f/u:  1) HTN - reports compliance with ongoing medical treatment and no changes in medication dose or frequency. denies adverse side effects related to current therapy. no HA or vision change or weakness, no edema  2) dyslipidemia - reports compliance with ongoing medical treatment and no changes in medication dose or frequency. denies adverse side effects related to current therapy. no muscle aches or GI signs   3) CAD - s/p CABG 10/2008 - follows with cards for same - reports compliance with ongoing medical treatment and no changes in medication dose or frequency. denies adverse side effects related to current therapy. 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 - pain was improved after taking percocet s/p CABG '10, but caused "foggy head" - using occassional hydrocodone for bad days - tried ulracet from12/2010 with no relief now with rash after hydrocodone - ?change to percocet again; no other med changes pursued left lift shoes and PT w/o sig change in symptoms    Clinical Review Panels:  Lipid Management   Cholesterol:  120 (03/28/2009)   LDL (bad choesterol):  70 (03/28/2009)   HDL (good  cholesterol):  57.84 (03/28/2009)  Complete Metabolic Panel   Glucose:  131 (01/19/2009)   Sodium:  140 (01/19/2009)   Potassium:  4.1 (01/19/2009)   Chloride:  107 (01/19/2009)   CO2:  26 (01/19/2009)   BUN:  18 (01/19/2009)   Creatinine:  0.9 (01/19/2009)   Albumin:  3.9 (03/28/2009)   Total Protein:  8.1 (03/28/2009)   Calcium:  9.0 (01/19/2009)   Total Bili:  1.6 (03/28/2009)   Alk Phos:  66 (03/28/2009)   SGPT (ALT):  34 (03/28/2009)   SGOT (AST):  33 (03/28/2009)   Current Medications (verified): 1)  Aspirin 81 Mg Tbec (Aspirin) .... Take One Tablet By Mouth Daily 2)  Metoprolol Tartrate 25 Mg Tabs (Metoprolol Tartrate) .... Take One Half Tablet By Mouth Twice A Day 3)  Furosemide 40 Mg Tabs (Furosemide) .... Take One Tablet By Mouth Daily. 4)  Potassium Chloride Crys Cr 20 Meq Cr-Tabs (Potassium Chloride Crys Cr) .... One Tablet By Mouth Twice Daily 5)  Wellbutrin Xl 150 Mg Xr24h-Tab (Bupropion Hcl) .[name].. 1 Tab By Mouth Once Daily 6)  Lipitor 40 Mg Tabs (Atorvastatin Calcium) .... Take One Tablet By Mouth Daily. 7)  Mucinex 600 Mg Xr12h-Tab (Guaifenesin) .[name].. 1 Tab By Mouth Two Times A Day 8)  Albuterol Sulfate (2.5  Mg/62ml) 0.083% Nebu (Albuterol Sulfate) .... 2 Puffs As Needed 9)  Nitroglycerin 0.4 Mg Subl (Nitroglycerin) .... One Tablet Under Tongue Every 5 Minutes As Needed For Chest Pain---May Repeat Times Three 10)  Ambien 5 Mg Tabs (Zolpidem Tartrate) .... Take 1 Tab By Mouth At Bedtime As Needed For Sleep 11)  Amlodipine Besylate 10 Mg Tabs (Amlodipine Besylate) .... One By Mouth Daily 12)  Niaspan 500 Mg Cr-Tabs (Niacin (Antihyperlipidemic)) .... Two Tablets By Mouth Once Daily 13)  Stool Softener 100 Mg Caps (Docusate Sodium) .... Take 1 Two Times A Day 14)  Nasonex 50 Mcg/act Susp (Mometasone Furoate) .[name].. 1 Spray Each Nostril Every Morning  Allergies (verified): 1)  ! 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    Physical Exam  General:  alert, well-developed, well-nourished, and cooperative to examination.    Lungs:  normal respiratory effort, no intercostal retractions or use of accessory muscles; normal breath sounds bilaterally - no crackles and no wheezes.    Heart:  regular rate and rhythm with 2/6 systolic murmur left sternal border. Radiates to the carotids. mild LLE edema as compared to RLE (chronic) Skin:  mild excema (excoriation) changes B forearms - no trunk rash or face, palms, legs, back - no hives or blisters - no angioedema Psych:  Oriented X3, memory intact for recent and remote, normally interactive, good eye contact, not anxious appearing, not depressed appearing, and not agitated.      Impression & Recommendations:  Problem # 1:  BACK PAIN (ICD-724.5) now with rash after hydrocodone - ok to try ercocet but explained still with risk of rash on this tx - pt understands poss risk and poss benefits and agrees to same including pick up of rx q30d as no refills or called in oxy rx also to use benadryl as needed and notify us of ongoing cross rxn (rash) for consideration of nonnarc tx --   The following medications were removed from the medication list:    Hydrocodone-acetaminophen 5-500 Mg Tabs (Hydrocodone-acetaminophen) .[name]... Take 1-2 by mouth once daily as needed for pain His updated medication list for this problem includes:    Aspirin 81 Mg Tbec (Aspirin) .[name]... Take one tablet by mouth daily    Oxycodone-acetaminophen 5-325 Mg Tabs (Oxycodone-acetaminophen) .[name]... 1 by mouth two times a day as needed for severe pain  chronic, related to leg length discrepency - s/p PT eval and now with  shoe lift in LLE Ultracet ineffective, unable to take NSAIDs due to CAD hx - see above  Problem # 2:  HYPERLIPIDEMIA (ICD-272.4)  His updated medication list for this problem includes:    Lipitor 40 Mg Tabs (Atorvastatin calcium) .[name]... Take one tablet by mouth daily.    Niaspan 500 Mg Cr-tabs (Niacin (antihyperlipidemic)) .[name]..[name] Two tablets by mouth once daily  Orders: TLB-Lipid Panel (80061-LIPID)  Labs Reviewed: SGOT: 33 (03/28/2009)   SGPT: 34 (03/28/2009)   HDL:40.10 (03/28/2009), 35.90 (01/08/2009)  LDL:70 (03/28/2009), 86 (01/08/2009)  Chol:120 (03/28/2009), 136 (01/08/2009)  Trig:49.0 (03/28/2009), 70.0 (01/08/2009)  Problem # 3:  CAD (ICD-414.00)  His updated medication list for this problem includes:    Aspirin 81 Mg Tbec (Aspirin) .[name]... Take one  tablet by mouth daily    Metoprolol Tartrate 25 Mg Tabs (Metoprolol tartrate) .[name]... Take one half tablet by mouth twice a day    Furosemide 40 Mg Tabs (Furosemide) .[name]... Take one tablet by mouth daily.    Nitroglycerin 0.4 Mg Subl (Nitroglycerin) ..... One tablet under tongue every 5 minutes as needed for chest pain---may repeat times three    Amlodipine Besylate 10 Mg Tabs (Amlodipine besylate) ..... One by mouth daily  s/p CABG 10/2008 - cont med mgmt  Labs Reviewed: Chol: 120 (03/28/2009)   HDL: 40.10 (03/28/2009)   LDL: 70 (03/28/2009)   TG: 49.0 (03/28/2009)  Problem # 4:  HYPERTENSION (ICD-401.9)  His updated medication list for this problem includes:    Metoprolol Tartrate 25 Mg Tabs (Metoprolol tartrate) .[name]... Take one half tablet by mouth twice a day    Furosemide 40 Mg Tabs (Furosemide) .[name]... Take one tablet by mouth daily.    Amlodipine Besylate 10 Mg Tabs (Amlodipine besylate) ..... One by mouth daily  BP today: 122/82 Prior BP: 132/70 (01/07/2010)  Labs Reviewed: K+: 4.1 (01/19/2009) Creat: : 0.9 (01/19/2009)   Chol: 120 (03/28/2009)   HDL: 40.10 (03/28/2009)   LDL: 70 (03/28/2009)   TG: 49.0  (03/28/2009)  Complete Medication List: 1)  Aspirin 81 Mg Tbec (Aspirin) .... Take one tablet by mouth daily 2)  Metoprolol Tartrate 25 Mg Tabs (Metoprolol tartrate) .... Take one half tablet by mouth twice a day 3)  Furosemide 40 Mg Tabs (Furosemide) .... Take one tablet by mouth daily. 4)  Potassium Chloride Crys Cr 20 Meq Cr-tabs (Potassium chloride crys cr) .... One tablet by mouth twice daily 5)  Wellbutrin Xl 150 Mg Xr24h-tab (Bupropion hcl) .[name].. 1 tab by mouth once daily 6)  Lipitor 40 Mg Tabs (Atorvastatin calcium) .... Take one tablet by mouth daily. 7)  Mucinex 600 Mg Xr12h-tab (Guaifenesin) .[name].. 1 tab by mouth two times a day 8)  Albuterol Sulfate (2.5 Mg/51ml) 0.083% Nebu (Albuterol sulfate) .... 2 puffs as needed 9)  Nitroglycerin 0.4 Mg Subl (Nitroglycerin) .... One tablet under tongue every 5 minutes as needed for chest pain---may repeat times three 10)  Ambien 5 Mg Tabs (Zolpidem tartrate) .... Take 1 tab by mouth at bedtime as needed for sleep 11)  Amlodipine Besylate 10 Mg Tabs (Amlodipine besylate) .... One by mouth daily 12)  Niaspan 500 Mg Cr-tabs (Niacin (antihyperlipidemic)) .... Two tablets by mouth once daily 13)  Stool Softener 100 Mg Caps (Docusate sodium) .... Take 1 two times a day 14)  Nasonex 50 Mcg/act Susp (Mometasone furoate) .[name].. 1 spray each nostril every morning 15)  Oxycodone-acetaminophen 5-325 Mg Tabs (Oxycodone-acetaminophen) .[name].. 1 by mouth two times a day as needed for severe pain 16)  Benadryl 25 Mg Tabs (Diphenhydramine hcl) .[name].. 1 by mouth every 4 hour as needed for itch symptoms  Other Orders: TLB-Hepatic/Liver Function Pnl (80076-HEPATIC)  Patient Instructions: 1)  it was good to see you today. 2)  stop hydrocodone and start generic percocet fpr pain symptoms - you will need to pick up this med prescription every 30d as needed - no early refills or replacment fo rlost prescriptions 3)  if rash or itch symptoms, use benadryl as needed  4)   test(s) ordered today - your results will be posted on the phone tree for review in 48-72 hours from the time of test completion; call [phone] and enter your 9 digit MRN (listed above on this page, just below your name); if any changes  need to be made or there are abnormal results, you will be contacted directly.  5)  Please schedule a follow-up appointment in 6 months, call sooner if problems. ok to cancel 06/2010 appt Prescriptions: OXYCODONE-ACETAMINOPHEN 5-325 MG TABS (OXYCODONE-ACETAMINOPHEN) 1 by mouth two times a day as needed for severe pain  #40 x 0   Entered and Authorized by:   Newt Lukes MD   Signed by:   Newt Lukes MD on 05/20/2010   Method used:   Print then Give to Patient   RxID:   [id number]    Orders Added: 1)  TLB-Lipid Panel [80061-LIPID] 2)  TLB-Hepatic/Liver Function Pnl [80076-HEPATIC] 3)  Est. Patient Level IV [56213]

## 2010-05-30 ENCOUNTER — Ambulatory Visit: Payer: Medicare Other | Admitting: Cardiology

## 2010-06-11 ENCOUNTER — Telehealth: Payer: Self-pay | Admitting: Internal Medicine

## 2010-06-17 ENCOUNTER — Telehealth: Payer: Self-pay | Admitting: Cardiology

## 2010-06-18 NOTE — Progress Notes (Signed)
Summary: Oxycodone refill  Phone Note Call from Patient Call back at [facility] Phone [phone]   Caller: Patient Summary of Call: Pt called requesting refill of Oxycodone, stating that he takes Rx two times a day and therefore would need # 60 to avoid requesting early refills. Initial call taken by: Margaret Pyle, CMA,  June 11, 2010 4:05 PM  Follow-up for Phone Call        fine, #60 ok- signed and given to triage b Follow-up by: Newt Lukes MD,  June 12, 2010 8:18 AM  Additional Follow-up for Phone Call Additional follow up Details #1::        Pt advised of quantity change. Rx in cabinet for pt pick up Additional Follow-up by: Margaret Pyle, CMA,  June 12, 2010 8:43 AM    Prescriptions: OXYCODONE-ACETAMINOPHEN 5-325 MG TABS (OXYCODONE-ACETAMINOPHEN) 1 by mouth two times a day as needed for severe pain  #60 x 0   Entered and Authorized by:   Newt Lukes MD   Signed by:   Newt Lukes MD on 06/12/2010   Method used:   Print then Give to Patient   RxID:   [id number]

## 2010-06-27 NOTE — Progress Notes (Signed)
Summary: refill request  Phone Note Refill Request Message from:  Patient on June 17, 2010 8:04 AM  Refills Requested: Medication #1:  NIASPAN 500 MG CR-TABS two tablets by mouth once daily pt needs niaspan 1000mg -insurance won't pay for two 500mg [address]   Method Requested: Telephone to Pharmacy Initial call taken by: Glynda Jaeger,  June 17, 2010 8:05 AM    New/Updated Medications: NIASPAN 1000 MG CR-TABS (NIACIN (ANTIHYPERLIPIDEMIC)) 1 tab by mouth once daily Prescriptions: NIASPAN 1000 MG CR-TABS (NIACIN (ANTIHYPERLIPIDEMIC)) 1 tab by mouth once daily  #30 x 12   Entered by:   Kem Parkinson   Authorized by:   Ferman Hamming, MD, [facility]   Signed by:   Kem Parkinson on 06/17/2010   Method used:   [facility] to        [facility]  [address] [id number]       [address]       [place], [place]  [address]       Ph: [phone] or [phone]       Fax: [phone]   RxID:   [id number]

## 2010-06-28 ENCOUNTER — Ambulatory Visit: Payer: Self-pay | Admitting: Internal Medicine

## 2010-07-02 ENCOUNTER — Encounter: Payer: Self-pay | Admitting: Cardiology

## 2010-07-11 ENCOUNTER — Other Ambulatory Visit: Payer: Self-pay

## 2010-07-11 MED ORDER — OXYCODONE-ACETAMINOPHEN 5-325 MG PO TABS: 1.0000 | ORAL_TABLET | Freq: Two times a day (BID) | ORAL | Status: DC

## 2010-07-11 NOTE — Telephone Encounter (Signed)
Pt called requesting refill of Oxycodone #60, last refilled 06/11/2010

## 2010-07-11 NOTE — Telephone Encounter (Signed)
Ok to refill - prescription printed and signed - placed on triage desk  

## 2010-07-11 NOTE — Telephone Encounter (Signed)
Pt advised of Rx, Rx in cabinet for pt pick up

## 2010-07-16 ENCOUNTER — Ambulatory Visit: Payer: Medicare Other | Admitting: Cardiology

## 2010-07-28 LAB — CBC
HCT: 28.4 % — ABNORMAL LOW (ref 39.0–52.0)
HCT: 31.3 % — ABNORMAL LOW (ref 39.0–52.0)
HCT: 33.7 % — ABNORMAL LOW (ref 39.0–52.0)
HCT: 33.8 % — ABNORMAL LOW (ref 39.0–52.0)
HCT: 35.7 % — ABNORMAL LOW (ref 39.0–52.0)
HCT: 36.3 % — ABNORMAL LOW (ref 39.0–52.0)
HCT: 36.3 % — ABNORMAL LOW (ref 39.0–52.0)
HCT: 44 % (ref 39.0–52.0)
Hemoglobin: 10.9 g/dL — ABNORMAL LOW (ref 13.0–17.0)
Hemoglobin: 11.8 g/dL — ABNORMAL LOW (ref 13.0–17.0)
Hemoglobin: 11.8 g/dL — ABNORMAL LOW (ref 13.0–17.0)
Hemoglobin: 12.3 g/dL — ABNORMAL LOW (ref 13.0–17.0)
Hemoglobin: 12.5 g/dL — ABNORMAL LOW (ref 13.0–17.0)
Hemoglobin: 12.5 g/dL — ABNORMAL LOW (ref 13.0–17.0)
Hemoglobin: 9.9 g/dL — ABNORMAL LOW (ref 13.0–17.0)
Hemoglobin: 15.2 g/dL (ref 13.0–17.0)
MCHC: 34.3 g/dL (ref 30.0–36.0)
MCHC: 34.4 g/dL (ref 30.0–36.0)
MCHC: 34.4 g/dL (ref 30.0–36.0)
MCHC: 34.7 g/dL (ref 30.0–36.0)
MCHC: 34.8 g/dL (ref 30.0–36.0)
MCHC: 34.9 g/dL (ref 30.0–36.0)
MCHC: 35 g/dL (ref 30.0–36.0)
MCHC: 34.5 g/dL (ref 30.0–36.0)
MCV: 87.3 fL (ref 78.0–100.0)
MCV: 87.7 fL (ref 78.0–100.0)
MCV: 88.1 fL (ref 78.0–100.0)
MCV: 88.1 fL (ref 78.0–100.0)
MCV: 88.1 fL (ref 78.0–100.0)
MCV: 88.2 fL (ref 78.0–100.0)
MCV: 88.2 fL (ref 78.0–100.0)
MCV: 87.7 fL (ref 78.0–100.0)
Platelets: 144 10*3/uL — ABNORMAL LOW (ref 150–400)
Platelets: 156 10*3/uL (ref 150–400)
Platelets: 160 10*3/uL (ref 150–400)
Platelets: 163 10*3/uL (ref 150–400)
Platelets: 165 10*3/uL (ref 150–400)
Platelets: 192 10*3/uL (ref 150–400)
Platelets: 194 10*3/uL (ref 150–400)
Platelets: 182 10*3/uL (ref 150–400)
RBC: 3.23 MIL/uL — ABNORMAL LOW (ref 4.22–5.81)
RBC: 3.58 MIL/uL — ABNORMAL LOW (ref 4.22–5.81)
RBC: 3.83 MIL/uL — ABNORMAL LOW (ref 4.22–5.81)
RBC: 3.85 MIL/uL — ABNORMAL LOW (ref 4.22–5.81)
RBC: 4.04 MIL/uL — ABNORMAL LOW (ref 4.22–5.81)
RBC: 4.12 MIL/uL — ABNORMAL LOW (ref 4.22–5.81)
RBC: 4.12 MIL/uL — ABNORMAL LOW (ref 4.22–5.81)
RBC: 5.02 MIL/uL (ref 4.22–5.81)
RDW: 13.8 % (ref 11.5–15.5)
RDW: 14 % (ref 11.5–15.5)
RDW: 14.1 % (ref 11.5–15.5)
RDW: 14.4 % (ref 11.5–15.5)
RDW: 14.4 % (ref 11.5–15.5)
RDW: 14.7 % (ref 11.5–15.5)
RDW: 15 % (ref 11.5–15.5)
RDW: 14 % (ref 11.5–15.5)
WBC: 11 10*3/uL — ABNORMAL HIGH (ref 4.0–10.5)
WBC: 11.6 10*3/uL — ABNORMAL HIGH (ref 4.0–10.5)
WBC: 13.4 10*3/uL — ABNORMAL HIGH (ref 4.0–10.5)
WBC: 14.2 10*3/uL — ABNORMAL HIGH (ref 4.0–10.5)
WBC: 14.5 10*3/uL — ABNORMAL HIGH (ref 4.0–10.5)
WBC: 14.8 10*3/uL — ABNORMAL HIGH (ref 4.0–10.5)
WBC: 9.9 10*3/uL (ref 4.0–10.5)
WBC: 6.3 10*3/uL (ref 4.0–10.5)

## 2010-07-28 LAB — POCT I-STAT 4, (NA,K, GLUC, HGB,HCT)
Glucose, Bld: 108 mg/dL — ABNORMAL HIGH (ref 70–99)
Glucose, Bld: 111 mg/dL — ABNORMAL HIGH (ref 70–99)
Glucose, Bld: 126 mg/dL — ABNORMAL HIGH (ref 70–99)
Glucose, Bld: 133 mg/dL — ABNORMAL HIGH (ref 70–99)
Glucose, Bld: 142 mg/dL — ABNORMAL HIGH (ref 70–99)
Glucose, Bld: 98 mg/dL (ref 70–99)
HCT: 24 % — ABNORMAL LOW (ref 39.0–52.0)
HCT: 27 % — ABNORMAL LOW (ref 39.0–52.0)
HCT: 28 % — ABNORMAL LOW (ref 39.0–52.0)
HCT: 36 % — ABNORMAL LOW (ref 39.0–52.0)
HCT: 39 % (ref 39.0–52.0)
HCT: 39 % (ref 39.0–52.0)
Hemoglobin: 12.2 g/dL — ABNORMAL LOW (ref 13.0–17.0)
Hemoglobin: 13.3 g/dL (ref 13.0–17.0)
Hemoglobin: 13.3 g/dL (ref 13.0–17.0)
Hemoglobin: 8.2 g/dL — ABNORMAL LOW (ref 13.0–17.0)
Hemoglobin: 9.2 g/dL — ABNORMAL LOW (ref 13.0–17.0)
Hemoglobin: 9.5 g/dL — ABNORMAL LOW (ref 13.0–17.0)
Potassium: 3.7 mEq/L (ref 3.5–5.1)
Potassium: 4 mEq/L (ref 3.5–5.1)
Potassium: 4 mEq/L (ref 3.5–5.1)
Potassium: 4.5 mEq/L (ref 3.5–5.1)
Potassium: 4.6 mEq/L (ref 3.5–5.1)
Potassium: 4.9 mEq/L (ref 3.5–5.1)
Sodium: 133 mEq/L — ABNORMAL LOW (ref 135–145)
Sodium: 138 mEq/L (ref 135–145)
Sodium: 140 mEq/L (ref 135–145)
Sodium: 141 mEq/L (ref 135–145)
Sodium: 141 mEq/L (ref 135–145)
Sodium: 141 mEq/L (ref 135–145)

## 2010-07-28 LAB — URINALYSIS, ROUTINE W REFLEX MICROSCOPIC
Bilirubin Urine: NEGATIVE
Glucose, UA: NEGATIVE mg/dL
Hgb urine dipstick: NEGATIVE
Ketones, ur: NEGATIVE mg/dL
Nitrite: NEGATIVE
Protein, ur: NEGATIVE mg/dL
Specific Gravity, Urine: 1.017 (ref 1.005–1.030)
Urobilinogen, UA: 1 mg/dL (ref 0.0–1.0)
pH: 6 (ref 5.0–8.0)

## 2010-07-28 LAB — BASIC METABOLIC PANEL
BUN: 12 mg/dL (ref 6–23)
BUN: 13 mg/dL (ref 6–23)
BUN: 15 mg/dL (ref 6–23)
BUN: 16 mg/dL (ref 6–23)
CO2: 24 mEq/L (ref 19–32)
CO2: 26 mEq/L (ref 19–32)
CO2: 28 mEq/L (ref 19–32)
CO2: 29 mEq/L (ref 19–32)
Calcium: 7.6 mg/dL — ABNORMAL LOW (ref 8.4–10.5)
Calcium: 7.7 mg/dL — ABNORMAL LOW (ref 8.4–10.5)
Calcium: 8.1 mg/dL — ABNORMAL LOW (ref 8.4–10.5)
Calcium: 8.1 mg/dL — ABNORMAL LOW (ref 8.4–10.5)
Chloride: 100 mEq/L (ref 96–112)
Chloride: 107 mEq/L (ref 96–112)
Chloride: 107 mEq/L (ref 96–112)
Chloride: 97 mEq/L (ref 96–112)
Creatinine, Ser: 0.67 mg/dL (ref 0.4–1.5)
Creatinine, Ser: 0.75 mg/dL (ref 0.4–1.5)
Creatinine, Ser: 0.87 mg/dL (ref 0.4–1.5)
Creatinine, Ser: 0.93 mg/dL (ref 0.4–1.5)
GFR calc Af Amer: >60 mL/min (ref >60)
GFR calc Af Amer: >60 mL/min (ref >60)
GFR calc Af Amer: >60 mL/min (ref >60)
GFR calc Af Amer: >60 mL/min (ref >60)
GFR calc non Af Amer: >60 mL/min (ref >60)
GFR calc non Af Amer: >60 mL/min (ref >60)
GFR calc non Af Amer: >60 mL/min (ref >60)
GFR calc non Af Amer: >60 mL/min (ref >60)
Glucose, Bld: 115 mg/dL — ABNORMAL HIGH (ref 70–99)
Glucose, Bld: 122 mg/dL — ABNORMAL HIGH (ref 70–99)
Glucose, Bld: 122 mg/dL — ABNORMAL HIGH (ref 70–99)
Glucose, Bld: 135 mg/dL — ABNORMAL HIGH (ref 70–99)
Potassium: 3.2 mEq/L — ABNORMAL LOW (ref 3.5–5.1)
Potassium: 3.8 mEq/L (ref 3.5–5.1)
Potassium: 4 mEq/L (ref 3.5–5.1)
Potassium: 4.1 mEq/L (ref 3.5–5.1)
Sodium: 130 mEq/L — ABNORMAL LOW (ref 135–145)
Sodium: 134 mEq/L — ABNORMAL LOW (ref 135–145)
Sodium: 136 mEq/L (ref 135–145)
Sodium: 140 mEq/L (ref 135–145)

## 2010-07-28 LAB — GLUCOSE, CAPILLARY
Glucose-Capillary: 100 mg/dL — ABNORMAL HIGH (ref 70–99)
Glucose-Capillary: 100 mg/dL — ABNORMAL HIGH (ref 70–99)
Glucose-Capillary: 104 mg/dL — ABNORMAL HIGH (ref 70–99)
Glucose-Capillary: 108 mg/dL — ABNORMAL HIGH (ref 70–99)
Glucose-Capillary: 111 mg/dL — ABNORMAL HIGH (ref 70–99)
Glucose-Capillary: 115 mg/dL — ABNORMAL HIGH (ref 70–99)
Glucose-Capillary: 116 mg/dL — ABNORMAL HIGH (ref 70–99)
Glucose-Capillary: 117 mg/dL — ABNORMAL HIGH (ref 70–99)
Glucose-Capillary: 123 mg/dL — ABNORMAL HIGH (ref 70–99)
Glucose-Capillary: 124 mg/dL — ABNORMAL HIGH (ref 70–99)
Glucose-Capillary: 125 mg/dL — ABNORMAL HIGH (ref 70–99)
Glucose-Capillary: 126 mg/dL — ABNORMAL HIGH (ref 70–99)
Glucose-Capillary: 127 mg/dL — ABNORMAL HIGH (ref 70–99)
Glucose-Capillary: 128 mg/dL — ABNORMAL HIGH (ref 70–99)
Glucose-Capillary: 135 mg/dL — ABNORMAL HIGH (ref 70–99)
Glucose-Capillary: 143 mg/dL — ABNORMAL HIGH (ref 70–99)
Glucose-Capillary: 144 mg/dL — ABNORMAL HIGH (ref 70–99)
Glucose-Capillary: 145 mg/dL — ABNORMAL HIGH (ref 70–99)
Glucose-Capillary: 146 mg/dL — ABNORMAL HIGH (ref 70–99)
Glucose-Capillary: 149 mg/dL — ABNORMAL HIGH (ref 70–99)
Glucose-Capillary: 154 mg/dL — ABNORMAL HIGH (ref 70–99)
Glucose-Capillary: 270 mg/dL — ABNORMAL HIGH (ref 70–99)
Glucose-Capillary: 73 mg/dL (ref 70–99)
Glucose-Capillary: 92 mg/dL (ref 70–99)
Glucose-Capillary: 93 mg/dL (ref 70–99)

## 2010-07-28 LAB — BLOOD GAS, ARTERIAL
Acid-base deficit: 1.7 mmol/L (ref 0.0–2.0)
Bicarbonate: 21.9 mEq/L (ref 20.0–24.0)
Drawn by: 313941
FIO2: 0.21 %
O2 Saturation: 98.7 %
Patient temperature: 98.6
TCO2: 22.9 mmol/L (ref 0–100)
pCO2 arterial: 32.5 mmHg — ABNORMAL LOW (ref 35.0–45.0)
pH, Arterial: 7.442 (ref 7.350–7.450)
pO2, Arterial: 109 mmHg — ABNORMAL HIGH (ref 80.0–100.0)

## 2010-07-28 LAB — POCT I-STAT 3, ART BLOOD GAS (G3+)
Acid-base deficit: 1 mmol/L (ref 0.0–2.0)
Acid-base deficit: 4 mmol/L — ABNORMAL HIGH (ref 0.0–2.0)
Acid-base deficit: 4 mmol/L — ABNORMAL HIGH (ref 0.0–2.0)
Bicarbonate: 22.5 mEq/L (ref 20.0–24.0)
Bicarbonate: 22.9 mEq/L (ref 20.0–24.0)
Bicarbonate: 24.7 mEq/L — ABNORMAL HIGH (ref 20.0–24.0)
O2 Saturation: 100 %
O2 Saturation: 96 %
O2 Saturation: 96 %
Patient temperature: 35.9
Patient temperature: 36.2
TCO2: 24 mmol/L (ref 0–100)
TCO2: 24 mmol/L (ref 0–100)
TCO2: 26 mmol/L (ref 0–100)
pCO2 arterial: 42.8 mmHg (ref 35.0–45.0)
pCO2 arterial: 43.2 mmHg (ref 35.0–45.0)
pCO2 arterial: 48.1 mmHg — ABNORMAL HIGH (ref 35.0–45.0)
pH, Arterial: 7.28 — ABNORMAL LOW (ref 7.350–7.450)
pH, Arterial: 7.325 — ABNORMAL LOW (ref 7.350–7.450)
pH, Arterial: 7.364 (ref 7.350–7.450)
pO2, Arterial: 324 mmHg — ABNORMAL HIGH (ref 80.0–100.0)
pO2, Arterial: 83 mmHg (ref 80.0–100.0)
pO2, Arterial: 86 mmHg (ref 80.0–100.0)

## 2010-07-28 LAB — COMPREHENSIVE METABOLIC PANEL
ALT: 100 U/L — ABNORMAL HIGH (ref 0–53)
ALT: 45 U/L (ref 0–53)
AST: 67 U/L — ABNORMAL HIGH (ref 0–37)
AST: 40 U/L — ABNORMAL HIGH (ref 0–37)
Albumin: 2.9 g/dL — ABNORMAL LOW (ref 3.5–5.2)
Albumin: 3.9 g/dL (ref 3.5–5.2)
Alkaline Phosphatase: 59 U/L (ref 39–117)
Alkaline Phosphatase: 88 U/L (ref 39–117)
BUN: 19 mg/dL (ref 6–23)
BUN: 12 mg/dL (ref 6–23)
CO2: 29 mEq/L (ref 19–32)
CO2: 21 mEq/L (ref 19–32)
Calcium: 8.1 mg/dL — ABNORMAL LOW (ref 8.4–10.5)
Calcium: 9.5 mg/dL (ref 8.4–10.5)
Chloride: 96 mEq/L (ref 96–112)
Chloride: 109 mEq/L (ref 96–112)
Creatinine, Ser: 0.79 mg/dL (ref 0.4–1.5)
Creatinine, Ser: 0.7 mg/dL (ref 0.4–1.5)
GFR calc Af Amer: >60 mL/min (ref >60)
GFR calc Af Amer: >60 mL/min (ref >60)
GFR calc non Af Amer: >60 mL/min (ref >60)
GFR calc non Af Amer: >60 mL/min (ref >60)
Glucose, Bld: 106 mg/dL — ABNORMAL HIGH (ref 70–99)
Glucose, Bld: 106 mg/dL — ABNORMAL HIGH (ref 70–99)
Potassium: 3.4 mEq/L — ABNORMAL LOW (ref 3.5–5.1)
Potassium: 4.3 mEq/L (ref 3.5–5.1)
Sodium: 132 mEq/L — ABNORMAL LOW (ref 135–145)
Sodium: 137 mEq/L (ref 135–145)
Total Bilirubin: 2.9 mg/dL — ABNORMAL HIGH (ref 0.3–1.2)
Total Bilirubin: 2.2 mg/dL — ABNORMAL HIGH (ref 0.3–1.2)
Total Protein: 6.1 g/dL (ref 6.0–8.3)
Total Protein: 7.9 g/dL (ref 6.0–8.3)

## 2010-07-28 LAB — HEMOGLOBIN A1C
Hgb A1c MFr Bld: 6 % (ref 4.6–6.1)
Mean Plasma Glucose: 126 mg/dL

## 2010-07-28 LAB — POCT I-STAT, CHEM 8
BUN: 14 mg/dL (ref 6–23)
BUN: 15 mg/dL (ref 6–23)
Calcium, Ion: 1.08 mmol/L — ABNORMAL LOW (ref 1.12–1.32)
Calcium, Ion: 1.15 mmol/L (ref 1.12–1.32)
Chloride: 107 mEq/L (ref 96–112)
Chloride: 99 mEq/L (ref 96–112)
Creatinine, Ser: 0.7 mg/dL (ref 0.4–1.5)
Creatinine, Ser: 0.8 mg/dL (ref 0.4–1.5)
Glucose, Bld: 115 mg/dL — ABNORMAL HIGH (ref 70–99)
Glucose, Bld: 128 mg/dL — ABNORMAL HIGH (ref 70–99)
HCT: 34 % — ABNORMAL LOW (ref 39.0–52.0)
HCT: 37 % — ABNORMAL LOW (ref 39.0–52.0)
Hemoglobin: 11.6 g/dL — ABNORMAL LOW (ref 13.0–17.0)
Hemoglobin: 12.6 g/dL — ABNORMAL LOW (ref 13.0–17.0)
Potassium: 4.1 mEq/L (ref 3.5–5.1)
Potassium: 4.6 mEq/L (ref 3.5–5.1)
Sodium: 138 mEq/L (ref 135–145)
Sodium: 140 mEq/L (ref 135–145)
TCO2: 22 mmol/L (ref 0–100)
TCO2: 26 mmol/L (ref 0–100)

## 2010-07-28 LAB — TYPE AND SCREEN
ABO/RH(D): A NEG
Antibody Screen: NEGATIVE

## 2010-07-28 LAB — POCT I-STAT GLUCOSE
Glucose, Bld: 102 mg/dL — ABNORMAL HIGH (ref 70–99)
Operator id: 3406

## 2010-07-28 LAB — APTT
aPTT: 31 seconds (ref 24–37)
aPTT: 32 seconds (ref 24–37)

## 2010-07-28 LAB — MRSA PCR SCREENING: MRSA by PCR: NEGATIVE

## 2010-07-28 LAB — CREATININE, SERUM
Creatinine, Ser: 0.69 mg/dL (ref 0.4–1.5)
Creatinine, Ser: 0.76 mg/dL (ref 0.4–1.5)
GFR calc Af Amer: >60 mL/min (ref >60)
GFR calc Af Amer: >60 mL/min (ref >60)
GFR calc non Af Amer: >60 mL/min (ref >60)
GFR calc non Af Amer: >60 mL/min (ref >60)

## 2010-07-28 LAB — ABO/RH: ABO/RH(D): A NEG

## 2010-07-28 LAB — PROTIME-INR
INR: 1.5 (ref 0.00–1.49)
INR: 1.2 (ref 0.00–1.49)
Prothrombin Time: 18.8 seconds — ABNORMAL HIGH (ref 11.6–15.2)
Prothrombin Time: 15.7 seconds — ABNORMAL HIGH (ref 11.6–15.2)

## 2010-07-28 LAB — MAGNESIUM
Magnesium: 2.2 mg/dL (ref 1.5–2.5)
Magnesium: 2.3 mg/dL (ref 1.5–2.5)
Magnesium: 2.7 mg/dL — ABNORMAL HIGH (ref 1.5–2.5)

## 2010-07-28 LAB — AMYLASE: Amylase: 81 U/L (ref 27–131)

## 2010-07-28 LAB — HEMOGLOBIN AND HEMATOCRIT, BLOOD
HCT: 25.1 % — ABNORMAL LOW (ref 39.0–52.0)
Hemoglobin: 8.7 g/dL — ABNORMAL LOW (ref 13.0–17.0)

## 2010-07-28 LAB — PLATELET COUNT: Platelets: 129 10*3/uL — ABNORMAL LOW (ref 150–400)

## 2010-07-29 ENCOUNTER — Other Ambulatory Visit: Payer: Self-pay | Admitting: Internal Medicine

## 2010-07-29 LAB — CBC
HCT: 39.7 % (ref 39.0–52.0)
HCT: 39.8 % (ref 39.0–52.0)
HCT: 39.9 % (ref 39.0–52.0)
HCT: 40.3 % (ref 39.0–52.0)
HCT: 40.7 % (ref 39.0–52.0)
HCT: 43 % (ref 39.0–52.0)
HCT: 48.8 % (ref 39.0–52.0)
HCT: 49.2 % (ref 39.0–52.0)
Hemoglobin: 13.5 g/dL (ref 13.0–17.0)
Hemoglobin: 13.6 g/dL (ref 13.0–17.0)
Hemoglobin: 13.6 g/dL (ref 13.0–17.0)
Hemoglobin: 13.8 g/dL (ref 13.0–17.0)
Hemoglobin: 14 g/dL (ref 13.0–17.0)
Hemoglobin: 14.5 g/dL (ref 13.0–17.0)
Hemoglobin: 16.5 g/dL (ref 13.0–17.0)
Hemoglobin: 16.9 g/dL (ref 13.0–17.0)
MCHC: 33.7 g/dL (ref 30.0–36.0)
MCHC: 33.8 g/dL (ref 30.0–36.0)
MCHC: 33.9 g/dL (ref 30.0–36.0)
MCHC: 34.3 g/dL (ref 30.0–36.0)
MCHC: 34.3 g/dL (ref 30.0–36.0)
MCHC: 34.3 g/dL (ref 30.0–36.0)
MCHC: 34.3 g/dL (ref 30.0–36.0)
MCHC: 34.4 g/dL (ref 30.0–36.0)
MCV: 88.6 fL (ref 78.0–100.0)
MCV: 88.7 fL (ref 78.0–100.0)
MCV: 89 fL (ref 78.0–100.0)
MCV: 89 fL (ref 78.0–100.0)
MCV: 89.1 fL (ref 78.0–100.0)
MCV: 89.2 fL (ref 78.0–100.0)
MCV: 89.2 fL (ref 78.0–100.0)
MCV: 89.3 fL (ref 78.0–100.0)
Platelets: 101 10*3/uL — ABNORMAL LOW (ref 150–400)
Platelets: 119 10*3/uL — ABNORMAL LOW (ref 150–400)
Platelets: 123 10*3/uL — ABNORMAL LOW (ref 150–400)
Platelets: 124 10*3/uL — ABNORMAL LOW (ref 150–400)
Platelets: 134 10*3/uL — ABNORMAL LOW (ref 150–400)
Platelets: 161 10*3/uL (ref 150–400)
Platelets: 95 10*3/uL — ABNORMAL LOW (ref 150–400)
Platelets: 99 10*3/uL — ABNORMAL LOW (ref 150–400)
RBC: 4.46 MIL/uL (ref 4.22–5.81)
RBC: 4.46 MIL/uL (ref 4.22–5.81)
RBC: 4.5 MIL/uL (ref 4.22–5.81)
RBC: 4.54 MIL/uL (ref 4.22–5.81)
RBC: 4.57 MIL/uL (ref 4.22–5.81)
RBC: 4.82 MIL/uL (ref 4.22–5.81)
RBC: 5.47 MIL/uL (ref 4.22–5.81)
RBC: 5.51 MIL/uL (ref 4.22–5.81)
RDW: 14.8 % (ref 11.5–15.5)
RDW: 15 % (ref 11.5–15.5)
RDW: 15.1 % (ref 11.5–15.5)
RDW: 15.4 % (ref 11.5–15.5)
RDW: 15.5 % (ref 11.5–15.5)
RDW: 15.6 % — ABNORMAL HIGH (ref 11.5–15.5)
RDW: 15.6 % — ABNORMAL HIGH (ref 11.5–15.5)
RDW: 15.7 % — ABNORMAL HIGH (ref 11.5–15.5)
WBC: 6.9 10*3/uL (ref 4.0–10.5)
WBC: 7 10*3/uL (ref 4.0–10.5)
WBC: 7.1 10*3/uL (ref 4.0–10.5)
WBC: 7.5 10*3/uL (ref 4.0–10.5)
WBC: 8.2 10*3/uL (ref 4.0–10.5)
WBC: 8.4 10*3/uL (ref 4.0–10.5)
WBC: 8.6 10*3/uL (ref 4.0–10.5)
WBC: 8.6 10*3/uL (ref 4.0–10.5)

## 2010-07-29 LAB — BASIC METABOLIC PANEL
BUN: 11 mg/dL (ref 6–23)
BUN: 12 mg/dL (ref 6–23)
BUN: 12 mg/dL (ref 6–23)
BUN: 13 mg/dL (ref 6–23)
BUN: 13 mg/dL (ref 6–23)
CO2: 22 mEq/L (ref 19–32)
CO2: 25 mEq/L (ref 19–32)
CO2: 25 mEq/L (ref 19–32)
CO2: 28 mEq/L (ref 19–32)
CO2: 30 mEq/L (ref 19–32)
Calcium: 8.2 mg/dL — ABNORMAL LOW (ref 8.4–10.5)
Calcium: 8.3 mg/dL — ABNORMAL LOW (ref 8.4–10.5)
Calcium: 8.9 mg/dL (ref 8.4–10.5)
Calcium: 9 mg/dL (ref 8.4–10.5)
Calcium: 9.2 mg/dL (ref 8.4–10.5)
Chloride: 104 mEq/L (ref 96–112)
Chloride: 106 mEq/L (ref 96–112)
Chloride: 107 mEq/L (ref 96–112)
Chloride: 107 mEq/L (ref 96–112)
Chloride: 108 mEq/L (ref 96–112)
Creatinine, Ser: 0.67 mg/dL (ref 0.4–1.5)
Creatinine, Ser: 0.71 mg/dL (ref 0.4–1.5)
Creatinine, Ser: 0.72 mg/dL (ref 0.4–1.5)
Creatinine, Ser: 0.8 mg/dL (ref 0.4–1.5)
Creatinine, Ser: 0.8 mg/dL (ref 0.4–1.5)
GFR calc Af Amer: >60 mL/min (ref >60)
GFR calc Af Amer: >60 mL/min (ref >60)
GFR calc Af Amer: >60 mL/min (ref >60)
GFR calc Af Amer: >60 mL/min (ref >60)
GFR calc Af Amer: >60 mL/min (ref >60)
GFR calc non Af Amer: >60 mL/min (ref >60)
GFR calc non Af Amer: >60 mL/min (ref >60)
GFR calc non Af Amer: >60 mL/min (ref >60)
GFR calc non Af Amer: >60 mL/min (ref >60)
GFR calc non Af Amer: >60 mL/min (ref >60)
Glucose, Bld: 102 mg/dL — ABNORMAL HIGH (ref 70–99)
Glucose, Bld: 87 mg/dL (ref 70–99)
Glucose, Bld: 89 mg/dL (ref 70–99)
Glucose, Bld: 92 mg/dL (ref 70–99)
Glucose, Bld: 99 mg/dL (ref 70–99)
Potassium: 3.3 mEq/L — ABNORMAL LOW (ref 3.5–5.1)
Potassium: 3.5 mEq/L (ref 3.5–5.1)
Potassium: 3.7 mEq/L (ref 3.5–5.1)
Potassium: 3.7 mEq/L (ref 3.5–5.1)
Potassium: 3.9 mEq/L (ref 3.5–5.1)
Sodium: 136 mEq/L (ref 135–145)
Sodium: 137 mEq/L (ref 135–145)
Sodium: 140 mEq/L (ref 135–145)
Sodium: 140 mEq/L (ref 135–145)
Sodium: 140 mEq/L (ref 135–145)

## 2010-07-29 LAB — BLOOD GAS, ARTERIAL
Acid-base deficit: 0.6 mmol/L (ref 0.0–2.0)
Bicarbonate: 23.5 mEq/L (ref 20.0–24.0)
Drawn by: 244901
FIO2: 0.21 %
O2 Saturation: 93.6 %
Patient temperature: 97.9
TCO2: 24.7 mmol/L (ref 0–100)
pCO2 arterial: 37.5 mmHg (ref 35.0–45.0)
pH, Arterial: 7.412 (ref 7.350–7.450)
pO2, Arterial: 63.3 mmHg — ABNORMAL LOW (ref 80.0–100.0)

## 2010-07-29 LAB — CULTURE, BLOOD (ROUTINE X 2)
Culture: NO GROWTH
Culture: NO GROWTH

## 2010-07-29 LAB — DIFFERENTIAL
Basophils Absolute: 0 10*3/uL (ref 0.0–0.1)
Basophils Absolute: 0.1 10*3/uL (ref 0.0–0.1)
Basophils Absolute: 0.1 10*3/uL (ref 0.0–0.1)
Basophils Relative: 1 % (ref 0–1)
Basophils Relative: 1 % (ref 0–1)
Basophils Relative: 1 % (ref 0–1)
Eosinophils Absolute: 0.1 10*3/uL (ref 0.0–0.7)
Eosinophils Absolute: 0.1 10*3/uL (ref 0.0–0.7)
Eosinophils Absolute: 0.2 10*3/uL (ref 0.0–0.7)
Eosinophils Relative: 1 % (ref 0–5)
Eosinophils Relative: 2 % (ref 0–5)
Eosinophils Relative: 3 % (ref 0–5)
Lymphocytes Relative: 13 % (ref 12–46)
Lymphocytes Relative: 19 % (ref 12–46)
Lymphocytes Relative: 20 % (ref 12–46)
Lymphs Abs: 1 10*3/uL (ref 0.7–4.0)
Lymphs Abs: 1.3 10*3/uL (ref 0.7–4.0)
Lymphs Abs: 1.7 10*3/uL (ref 0.7–4.0)
Monocytes Absolute: 0.5 10*3/uL (ref 0.1–1.0)
Monocytes Absolute: 0.5 10*3/uL (ref 0.1–1.0)
Monocytes Absolute: 0.9 10*3/uL (ref 0.1–1.0)
Monocytes Relative: 10 % (ref 3–12)
Monocytes Relative: 6 % (ref 3–12)
Monocytes Relative: 7 % (ref 3–12)
Neutro Abs: 4.9 10*3/uL (ref 1.7–7.7)
Neutro Abs: 5.8 10*3/uL (ref 1.7–7.7)
Neutro Abs: 6.5 10*3/uL (ref 1.7–7.7)
Neutrophils Relative %: 68 % (ref 43–77)
Neutrophils Relative %: 70 % (ref 43–77)
Neutrophils Relative %: 80 % — ABNORMAL HIGH (ref 43–77)

## 2010-07-29 LAB — LIPID PANEL
Cholesterol: 166 mg/dL (ref 0–200)
Cholesterol: 244 mg/dL — ABNORMAL HIGH (ref 0–200)
HDL: 29 mg/dL — ABNORMAL LOW (ref >39)
HDL: 32 mg/dL — ABNORMAL LOW (ref >39)
LDL Cholesterol: 124 mg/dL — ABNORMAL HIGH (ref 0–99)
LDL Cholesterol: 183 mg/dL — ABNORMAL HIGH (ref 0–99)
Total CHOL/HDL Ratio: 5.7 RATIO
Total CHOL/HDL Ratio: 7.6 RATIO
Triglycerides: 143 mg/dL (ref <150)
Triglycerides: 65 mg/dL (ref <150)
VLDL: 13 mg/dL (ref 0–40)
VLDL: 29 mg/dL (ref 0–40)

## 2010-07-29 LAB — URINALYSIS, MICROSCOPIC ONLY
Bilirubin Urine: NEGATIVE
Glucose, UA: NEGATIVE mg/dL
Ketones, ur: NEGATIVE mg/dL
Leukocytes, UA: NEGATIVE
Nitrite: NEGATIVE
Protein, ur: NEGATIVE mg/dL
Specific Gravity, Urine: 1.008 (ref 1.005–1.030)
Urobilinogen, UA: 2 mg/dL — ABNORMAL HIGH (ref 0.0–1.0)
pH: 7 (ref 5.0–8.0)

## 2010-07-29 LAB — HEPARIN LEVEL (UNFRACTIONATED)
Heparin Unfractionated: 0.18 IU/mL — ABNORMAL LOW (ref 0.30–0.70)
Heparin Unfractionated: 0.29 IU/mL — ABNORMAL LOW (ref 0.30–0.70)
Heparin Unfractionated: 0.32 IU/mL (ref 0.30–0.70)
Heparin Unfractionated: 0.4 IU/mL (ref 0.30–0.70)
Heparin Unfractionated: 0.49 IU/mL (ref 0.30–0.70)
Heparin Unfractionated: <0.1 IU/mL — ABNORMAL LOW (ref 0.30–0.70)

## 2010-07-29 LAB — URINE CULTURE: Colony Count: 6000

## 2010-07-29 LAB — HEMOGLOBIN A1C
Hgb A1c MFr Bld: 5.7 % (ref 4.6–6.1)
Mean Plasma Glucose: 117 mg/dL

## 2010-07-29 LAB — COMPREHENSIVE METABOLIC PANEL
ALT: 37 U/L (ref 0–53)
AST: 52 U/L — ABNORMAL HIGH (ref 0–37)
Albumin: 3.9 g/dL (ref 3.5–5.2)
Alkaline Phosphatase: 65 U/L (ref 39–117)
BUN: 16 mg/dL (ref 6–23)
CO2: 24 mEq/L (ref 19–32)
Calcium: 8.6 mg/dL (ref 8.4–10.5)
Chloride: 109 mEq/L (ref 96–112)
Creatinine, Ser: 0.73 mg/dL (ref 0.4–1.5)
GFR calc Af Amer: >60 mL/min (ref >60)
GFR calc non Af Amer: >60 mL/min (ref >60)
Glucose, Bld: 149 mg/dL — ABNORMAL HIGH (ref 70–99)
Potassium: 3.6 mEq/L (ref 3.5–5.1)
Sodium: 140 mEq/L (ref 135–145)
Total Bilirubin: 1.2 mg/dL (ref 0.3–1.2)
Total Protein: 7.7 g/dL (ref 6.0–8.3)

## 2010-07-29 LAB — EXPECTORATED SPUTUM ASSESSMENT W GRAM STAIN, RFLX TO RESP C

## 2010-07-29 LAB — HEPATITIS B SURFACE ANTIGEN: Hepatitis B Surface Ag: NEGATIVE

## 2010-07-29 LAB — LEGIONELLA ANTIGEN, URINE: Legionella Antigen, Urine: NEGATIVE

## 2010-07-29 LAB — CK TOTAL AND CKMB (NOT AT ARMC)
CK, MB: 36.7 ng/mL — ABNORMAL HIGH (ref 0.3–4.0)
Relative Index: 14.4 — ABNORMAL HIGH (ref 0.0–2.5)
Total CK: 255 U/L — ABNORMAL HIGH (ref 7–232)

## 2010-07-29 LAB — EXPECTORATED SPUTUM ASSESSMENT W REFEX TO RESP CULTURE

## 2010-07-29 LAB — POCT CARDIAC MARKERS
CKMB, poc: 20.8 ng/mL (ref 1.0–8.0)
Myoglobin, poc: 196 ng/mL (ref 12–200)
Troponin i, poc: 0.22 ng/mL — ABNORMAL HIGH (ref 0.00–0.09)

## 2010-07-29 LAB — TSH: TSH: 1.276 u[IU]/mL (ref 0.350–4.500)

## 2010-07-29 LAB — CARDIAC PANEL(CRET KIN+CKTOT+MB+TROPI)
CK, MB: 4.3 ng/mL — ABNORMAL HIGH (ref 0.3–4.0)
Relative Index: 2.8 — ABNORMAL HIGH (ref 0.0–2.5)
Total CK: 154 U/L (ref 7–232)
Troponin I: 0.59 ng/mL (ref 0.00–0.06)

## 2010-07-29 LAB — STREP PNEUMONIAE URINARY ANTIGEN: Strep Pneumo Urinary Antigen: NEGATIVE

## 2010-07-29 LAB — TROPONIN I: Troponin I: 0.92 ng/mL (ref 0.00–0.06)

## 2010-07-29 LAB — PROTIME-INR
INR: 1.1 (ref 0.00–1.49)
Prothrombin Time: 14.1 seconds (ref 11.6–15.2)

## 2010-07-29 LAB — MAGNESIUM
Magnesium: 2 mg/dL (ref 1.5–2.5)
Magnesium: 2.1 mg/dL (ref 1.5–2.5)

## 2010-07-29 LAB — BRAIN NATRIURETIC PEPTIDE: Pro B Natriuretic peptide (BNP): 142 pg/mL — ABNORMAL HIGH (ref 0.0–100.0)

## 2010-07-29 LAB — RAPID URINE DRUG SCREEN, HOSP PERFORMED
Amphetamines: NOT DETECTED
Barbiturates: NOT DETECTED
Benzodiazepines: POSITIVE — AB
Cocaine: NOT DETECTED
Opiates: NOT DETECTED
Tetrahydrocannabinol: NOT DETECTED

## 2010-07-29 LAB — HEPATITIS B SURFACE ANTIBODY,QUALITATIVE: Hep B S Ab: NEGATIVE

## 2010-07-29 LAB — D-DIMER, QUANTITATIVE: D-Dimer, Quant: 2.41 ug/mL-FEU — ABNORMAL HIGH (ref 0.00–0.48)

## 2010-07-29 LAB — APTT: aPTT: 25 seconds (ref 24–37)

## 2010-08-12 ENCOUNTER — Encounter: Payer: Self-pay | Admitting: Cardiology

## 2010-08-12 ENCOUNTER — Other Ambulatory Visit: Payer: Self-pay

## 2010-08-12 ENCOUNTER — Ambulatory Visit (INDEPENDENT_AMBULATORY_CARE_PROVIDER_SITE_OTHER): Payer: Medicare Other | Admitting: Cardiology

## 2010-08-12 DIAGNOSIS — E785 Hyperlipidemia, unspecified: Secondary | ICD-10-CM

## 2010-08-12 DIAGNOSIS — I10 Essential (primary) hypertension: Secondary | ICD-10-CM

## 2010-08-12 DIAGNOSIS — I2510 Atherosclerotic heart disease of native coronary artery without angina pectoris: Secondary | ICD-10-CM

## 2010-08-12 DIAGNOSIS — I6529 Occlusion and stenosis of unspecified carotid artery: Secondary | ICD-10-CM

## 2010-08-12 DIAGNOSIS — R011 Cardiac murmur, unspecified: Secondary | ICD-10-CM

## 2010-08-12 DIAGNOSIS — I714 Abdominal aortic aneurysm, without rupture: Secondary | ICD-10-CM | POA: Insufficient documentation

## 2010-08-12 LAB — BASIC METABOLIC PANEL
BUN: 14 mg/dL (ref 6–23)
CO2: 29 mEq/L (ref 19–32)
Calcium: 8.6 mg/dL (ref 8.4–10.5)
Chloride: 99 mEq/L (ref 96–112)
Creatinine, Ser: 1.1 mg/dL (ref 0.4–1.5)
GFR: 73.1 mL/min (ref >60.00)
Glucose, Bld: 117 mg/dL — ABNORMAL HIGH (ref 70–99)
Potassium: 4 mEq/L (ref 3.5–5.1)
Sodium: 140 mEq/L (ref 135–145)

## 2010-08-12 MED ORDER — OXYCODONE-ACETAMINOPHEN 5-325 MG PO TABS: 1.0000 | ORAL_TABLET | Freq: Two times a day (BID) | ORAL | Status: DC

## 2010-08-12 NOTE — Telephone Encounter (Signed)
Pt informed via VM, Rx in cabinet for pt pick up  

## 2010-08-12 NOTE — Assessment & Plan Note (Signed)
Scheduled followup CTA abdominal aorta for aneurysm.

## 2010-08-12 NOTE — Assessment & Plan Note (Signed)
Blood pressure controlled. Continue present medications. Check potassium and renal function. 

## 2010-08-12 NOTE — Assessment & Plan Note (Signed)
Patient found to have an aortic stenosis murmur. Schedule echocardiogram.

## 2010-08-12 NOTE — Assessment & Plan Note (Signed)
Continue statin. Lipids and liver monitored by primary care. 

## 2010-08-12 NOTE — Assessment & Plan Note (Signed)
Continue aspirin and statin. 

## 2010-08-12 NOTE — Patient Instructions (Signed)
Your physician recommends that you schedule a follow-up appointment in: ONE YEAR  Your physician has requested that you have an echocardiogram. Echocardiography is a painless test that uses sound waves to create images of your heart. It provides your doctor with information about the size and shape of your heart and how well your heart's chambers and valves are working. This procedure takes approximately one hour. There are no restrictions for this procedure.   Your physician has requested that you have a carotid duplex. This test is an ultrasound of the carotid arteries in your neck. It looks at blood flow through these arteries that supply the brain with blood. Allow one hour for this exam. There are no restrictions or special instructions.   Non-Cardiac CT scanning, (CAT scanning), is a noninvasive, special x-ray that produces cross-sectional images of the body using x-rays and a computer. CT scans help physicians diagnose and treat medical conditions. For some CT exams, a contrast material is used to enhance visibility in the area of the body being studied. CT scans provide greater clarity and reveal more details than regular x-ray exams.   Your physician recommends that you return for lab work in: TODAY

## 2010-08-12 NOTE — Assessment & Plan Note (Signed)
Continue aspirin and statin. Schedule followup carotid Dopplers. 

## 2010-08-12 NOTE — Progress Notes (Signed)
[id number] gentleman previously admitted to [facility] with a non-ST elevation myocardial infarction. Cardiac cath revealed severe coronary disease with normal LV function. He does have significant COPD and he was discharged for pulmonary tuneup prior to coronary artery bypass and graft. His surgery was performed on November 03, 2008. He had left internal mammary artery to distal left anterior descending, saphenous vein graft to first circumflex marginal   branch with Y graft sequential to left posterolateral branch, saphenous  vein graft to distal right coronary artery. Note a preoperative echocardiogram showed normal LV function without significant aortic valve disease. Also note preoperative carotid Dopplers revealed a 40-59% left stenosis and no right stenosis. 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 I last saw him in September of 2010. Since then, he does not some dyspnea on exertion but denies orthopnea, PND, syncope or chest pain. Occasional mild pedal edema control with diuretics. He does not have claudication.  Current Outpatient Prescriptions  Medication Sig Dispense Refill  . albuterol (PROVENTIL,VENTOLIN) 90 MCG/ACT inhaler Inhale 2 puffs into the lungs as needed.        [name] amLODipine (NORVASC) 10 MG tablet Take 10 mg by mouth daily.        [name] aspirin 81 MG tablet Take 81 mg by mouth daily.        [name] atorvastatin (LIPITOR) 40 MG tablet Take 40 mg by mouth daily.        [name] buPROPion (WELLBUTRIN XL) 150 MG 24 hr tablet TAKE 1 TABLET EVERY DAY  30 tablet  5  . diphenhydrAMINE (SOMINEX) 25 MG tablet Take 25 mg by mouth every 4 (four) hours as needed.        . docusate sodium (STOOL SOFTENER) 100 MG capsule Take 100 mg by mouth 2 (two) times daily.        . furosemide (LASIX) 40 MG tablet Take 40 mg by mouth daily.        [name] guaiFENesin (MUCINEX) 600 MG 12 hr tablet Take 1,200 mg by mouth 2 (two) times daily.        .  metoprolol tartrate (LOPRESSOR) 25 MG tablet Take 12.5 mg by mouth 2 (two) times daily.        . mometasone (NASONEX) 50 MCG/ACT nasal spray 1 spray by Nasal route every morning.        . niacin (NIASPAN) 500 MG CR tablet Take 1,000 mg by mouth daily.        . nitroGLYCERIN (NITROSTAT) 0.4 MG SL tablet Place 0.4 mg under the tongue every 5 (five) minutes as needed.        [name] oxyCODONE-acetaminophen (PERCOCET) 5-325 MG per tablet Take 1 tablet by mouth 2 (two) times daily.  60 tablet  0  . potassium chloride (KLOR-CON) 20 MEQ packet Take 20 mEq by mouth 2 (two) times daily.        [name] zolpidem (AMBIEN) 5 MG tablet Take 5 mg by mouth at bedtime as needed.           Past Medical History  Diagnosis Date  . HTN (hypertension)   . 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 Hyperlipidemia   . COPD (chronic obstructive pulmonary disease)   . Cerebrovascular disease   . AAA (abdominal aortic aneurysm)     Past Surgical History  Procedure Date  . Coronary artery bypass graft 11/03/2008    Barry Dienes -  x4: left internal mammary artery to the distal left anterior descending, saphemous vein graft to the first circumflex marginal branch with a Y graft sequentiallly to a left posterolateral branch, saphenous vein graft to the distal right coronary artery    History   Social History  . Marital Status: Widowed    Spouse Name: N/A    Number of Children: N/A  . Years of Education: N/A   Occupational History  . Not on file.   Social History Main Topics  . Smoking status: Former Games developer  . Smokeless tobacco: Not on file  . Alcohol Use: Yes     History of heavy alcohol use per pt. Quit may years ago  . Drug Use:   . Sexually Active:    Other Topics Concern  . Not on file   Social History Narrative   lives alone here in [place].  He has 1 son, who lives in the [place], and he has  an uncle, who lives nearby as well.     ROS: no fevers or chills, productive cough, hemoptysis,  dysphasia, odynophagia, melena, hematochezia, dysuria, hematuria, rash, seizure activity, orthopnea, PND, pedal edema, claudication. Remaining systems are negative.  Physical Exam: Well-developed well-nourished in no acute distress.  Skin is warm and dry.  HEENT is normal.  Neck is supple. No thyromegaly. Bilateral bruits. Chest reveals mild expiratory wheeze and diminished breath sounds. Cardiovascular exam is regular rate and rhythm. 2/6 systolic murmur left sternal border radiating to the carotids. Abdominal exam nontender or distended. No masses palpated. Extremities show no edema. neuro grossly intact  ECG Sinus rhythm at a rate of 66. First degree AV block. Nonspecific ST changes.

## 2010-08-23 ENCOUNTER — Encounter: Payer: Medicare Other | Admitting: *Deleted

## 2010-08-23 ENCOUNTER — Other Ambulatory Visit (HOSPITAL_COMMUNITY): Payer: Medicare Other | Admitting: Radiology

## 2010-08-29 ENCOUNTER — Encounter (INDEPENDENT_AMBULATORY_CARE_PROVIDER_SITE_OTHER): Payer: Medicare Other | Admitting: *Deleted

## 2010-08-29 ENCOUNTER — Other Ambulatory Visit: Payer: Self-pay | Admitting: *Deleted

## 2010-08-29 ENCOUNTER — Ambulatory Visit (HOSPITAL_COMMUNITY): Payer: Medicare Other | Attending: Cardiology | Admitting: Radiology

## 2010-08-29 ENCOUNTER — Ambulatory Visit (INDEPENDENT_AMBULATORY_CARE_PROVIDER_SITE_OTHER)
Admission: RE | Admit: 2010-08-29 | Discharge: 2010-08-29 | Disposition: A | Discharge status: Home / Self Care | Payer: Medicare Other | Source: Ambulatory Visit | Attending: Cardiology | Admitting: Cardiology

## 2010-08-29 DIAGNOSIS — I714 Abdominal aortic aneurysm, without rupture, unspecified: Secondary | ICD-10-CM

## 2010-08-29 DIAGNOSIS — I6529 Occlusion and stenosis of unspecified carotid artery: Secondary | ICD-10-CM

## 2010-08-29 DIAGNOSIS — R011 Cardiac murmur, unspecified: Secondary | ICD-10-CM | POA: Insufficient documentation

## 2010-08-29 DIAGNOSIS — R0989 Other specified symptoms and signs involving the circulatory and respiratory systems: Secondary | ICD-10-CM

## 2010-08-29 DIAGNOSIS — I4891 Unspecified atrial fibrillation: Secondary | ICD-10-CM

## 2010-08-29 MED ORDER — IOHEXOL 300 MG/ML  SOLN
100.0000 mL | Freq: Once | INTRAMUSCULAR | Status: AC | PRN
  Administered 2010-08-29: 100 mL via INTRAVENOUS

## 2010-08-30 ENCOUNTER — Encounter: Payer: Self-pay | Admitting: Cardiology

## 2010-08-30 ENCOUNTER — Telehealth: Payer: Self-pay | Admitting: Cardiology

## 2010-08-30 NOTE — Telephone Encounter (Signed)
Spoke with pt, he is aware of carotid results. He wanted me to ask dr Jens Som if it was okay for him to use erectile dysfunction meds. He is aware he will need to get the script from dr Bayard Hugger and wants dr Jens Som ok to use. Will forward to dr Jens Som Deliah Goody

## 2010-08-30 NOTE — Telephone Encounter (Signed)
Pt wants results of carotid

## 2010-08-31 NOTE — Telephone Encounter (Signed)
Ok for erectile dysfunction meds if pending echo does not show severe AS, never take ntg within 24 hours of taking these meds [name]

## 2010-09-02 NOTE — Telephone Encounter (Signed)
Spoke with pt, he is aware of not to use NTG with those meds [name]

## 2010-09-03 NOTE — Assessment & Plan Note (Signed)
OFFICE VISIT   [name], [name]  DOB:  07/04/1940                                        October 30, 2008  CHART #:  [id number]   The patient returns to the office today for further consultation prior  to surgery scheduled later this week.  He was originally seen in  consultation on October 07, 2008, at which time he was hospitalized  following acute non-ST-segment elevation myocardial infarction.  He was  also noted to have severe chronic obstructive pulmonary disease with  active bronchitis related to longstanding heavy tobacco abuse.  He was  seen in consultation by the pulmonary critical care team, and he has  undergone an aggressive medical treatment plan to treat his bronchitis  and improve his chances with elective surgery for treatment of his  underlying three-vessel coronary artery disease.  Since hospital  discharge, the patient has done quite well.  He has not had any chest  pain or chest tightness.  He has not had any shortness of breath.  He  has continued to abstain from any tobacco use, and he notes that his  breathing is much better now than it has been in a long time.  He denies  any active cough.  He has not had any fevers or chills.  He also reports  that his legs feel better and he had noted that prior to his recent  hospitalization, he has had chronic pain in both lower legs.  This has  all resolved since he quit smoking.  The remainder of his review of  systems is unremarkable.  Medications remain unchanged at the time of  hospital discharge.   PHYSICAL EXAMINATION:  GENERAL:  A well-appearing male.  VITAL SIGNS:  Blood pressure 158/68, pulse 66, and oxygen saturation 95%  on room air.  HEENT:  Unrevealing.  LUNGS:  Auscultation of the chest reveals clear breath sounds  bilaterally which are symmetrical.  No wheezes or rhonchi are noted.  CARDIOVASCULAR:  Regular rate and rhythm.  No murmurs, rubs, or gallops  are appreciated.  ABDOMEN:   Soft and nontender.  EXTREMITIES:  Warm and well perfused.  There is no lower extremity  edema.   The remainder of his physical exam is noncontributory.   IMPRESSION:  The patient remains quite stable.  He has severe three-  vessel coronary artery disease with diffuse distal disease and  relatively poor target vessels for grafting.  He has preserved left  ventricular function.  His treatment for active bronchitis and chronic  obstructive pulmonary disease has gone well and his lungs sounds are now  clear on exam.  He has continued to abstain from any tobacco use and I  suspect that his chances with surgery are as optimal as it can be under  the circumstances.   PLAN:  I again reviewed the indications, risks, and potential benefits  of surgery with the patient here in the office today.  Alternative  treatment strategies have been discussed as well as long-term prognosis  of both medical and surgical treatment.  We will plan to proceed with  surgery on Friday, November 03, 2008, for high-risk coronary artery bypass  grafting.   Salvatore Decent. Cornelius Moras, M.D.  Electronically Signed   CHO/MEDQ  D:  10/30/2008  T:  10/31/2008  Job:  [id number]   cc:  Madolyn Frieze Jens Som, MD, [facility]  Rollene Rotunda, MD, [facility]

## 2010-09-03 NOTE — Assessment & Plan Note (Signed)
OFFICE VISIT   [name], [name]  DOB:  Oct 09, 1940                                        November 01, 2008  CHART #:  [id number]   The patient did not show up to the hospital today for his preadmission  assessment, lab work, and screening in anticipation of surgery scheduled  for this Friday July 16.  I contacted the patient via the telephone, and  he explained that he was very anxious about the surgery and having some  second thoughts about proceeding.  We had a long conversation and again  reviewed the indications, risks, and potential benefits of surgery.  He  remains anxious, but now states that he does desire to go ahead and  proceed as scheduled.  We will make arrangements for his hospital  preassessment and screening to be performed tomorrow morning at short  stay at 10:00 a.m.  He understands that he will need to keep this  appointment if we are to proceed with surgery as planned.  All of his  questions have been addressed.   Salvatore Decent. Cornelius Moras, M.D.  Electronically Signed   CHO/MEDQ  D:  11/01/2008  T:  11/02/2008  Job:  [id number]

## 2010-09-03 NOTE — Assessment & Plan Note (Signed)
OFFICE VISIT   [name], [name]  DOB:  12-14-1940                                        December 04, 2008  CHART #:  [id number]   The patient returns to the office today for routine followup, status  post coronary artery bypass grafting x4 on November 03, 2008.  Despite his  underlying severe chronic obstructive pulmonary disease, he has done  quite well postoperatively.  Early during his postoperative course,  while he remained in the hospital, he did develop a very mild transient  postoperative ileus as well as postoperative atrial fibrillation.  He  converted back to sinus rhythm on amiodarone and his ileus resolved  fairly quickly.  Otherwise, he did quite well.  Following hospital  discharge, he has continued to improve nicely.  He was seen in followup  by Dr. Jens Som two weeks ago, and apparently his blood pressure was  running low at that time and his blood pressure medicine was cut back a  little bit.  Otherwise, he has done quite well.  He states that he  stopped taking all pain relievers approximately 3 weeks ago.  He now has  only very mild residual soreness in his chest that bothers him only if  he coughs or sneezes.  He has not had problems with shortness of breath.  He has not had any productive cough.  He denies any fevers or chills.  His only complaint is that he is not sleeping fairly well at night.  His  exercise tolerance is gradually improving.  He has not yet started the  cardiac rehab program.  He has continued to remain diligent with respect  to abstinence from use of any tobacco products.  The remainder of his  review of systems is unremarkable.   Current medications include aspirin, Plavix, amlodipine, Wellbutrin,  Lipitor, DuoNeb, Pulmicort, metoprolol, Lasix, potassium, and  amiodarone.   PHYSICAL EXAMINATION:  GENERAL:  A well-appearing male.  VITAL SIGNS:  Blood pressure 157/63, pulse 58, and oxygen saturation 98%  on room air.  CHEST:  A median sternotomy incision is healing nicely.  The sternum is  stable on palpation.  LUNGS:  Breath sounds are clear to auscultation.  No wheezes, rales, or  rhonchi are noted.  Breath sounds are symmetrical.  CARDIOVASCULAR:  Regular rate and rhythm.  No murmurs, rubs, or gallops  are appreciated.  ABDOMEN:  Soft and nontender.  EXTREMITIES:  Warm and well perfused.  The small incision in the right  lower leg from endoscopic vein harvest is healing nicely.  There is no  lower extremity edema.  The remainder of his physical exam is  unremarkable.   DIAGNOSTIC TEST:  Chest x-ray performed today at the [facility] is reviewed.  This demonstrates clear lung fields with no  significant pleural effusions.  All the sternal wires appear intact.  No  other significant abnormalities are noted.   IMPRESSION:  Satisfactory progress following recent coronary artery  bypass grafting.   PLAN:  I have encouraged the patient to continue to gradually increase  his physical activity with his only limitation at this point remaining  that he refrain from heavy lifting or strenuous use of his arms or  shoulders for at least another 2 months.  I think he can go back to  driving an automobile.  I have suggested that he should drive short  distances during the daylight hours initially and gradually increase  from there.  I have strongly encouraged him to get started in cardiac  rehab program.  I have reminded him how important it will make for him  to continue to stay away from any tobacco use.  It is possible that he  could cut the use of the albuterol and DuoNeb inhalers to see how he  does without them.  All of his questions have been addressed.  In the  future, the patient will call and return to see [place] here at [facility] only should further problems or difficulties arise.   Salvatore Decent. Cornelius Moras, M.D.  Electronically Signed   CHO/MEDQ  D:  12/04/2008  T:   12/05/2008  Job:  [id number]   cc:   Madolyn Frieze. Jens Som, MD, [facility]  Rollene Rotunda, MD, [facility]

## 2010-09-03 NOTE — Discharge Summary (Signed)
NAME:  [name], [name] NO.:  [id number]   MEDICAL RECORD NO.:  [id number]          PATIENT TYPE:  INP   LOCATION:  2016                         FACILITY:  [facility]   PHYSICIAN:  Salvatore Decent. Cornelius Moras, M.D. DATE OF BIRTH:  03-23-1941   DATE OF ADMISSION:  11/03/2008  DATE OF DISCHARGE:  11/08/2008                               DISCHARGE SUMMARY   PRIMARY ADMITTING DIAGNOSIS:  Coronary artery disease.   ADDITIONAL/DISCHARGE DIAGNOSES:  1. Coronary artery disease.  2. Postoperative atrial fibrillation.  3. Postoperative ileus.  4. Hypertension.  5. Longstanding history of tobacco abuse.  6. Chronic obstructive pulmonary disease.  7. Mild acute blood loss anemia postoperatively.   PROCEDURES PERFORMED:  1. Coronary artery bypass grafting x4 (left internal mammary artery to      the distal left anterior descending, saphenous vein graft to the      first circumflex marginal branch with a Y graft sequentially to a      left posterolateral branch, saphenous vein graft to the distal      right coronary artery).  2. Endoscopic vein harvest, right leg.   HISTORY:  The patient is a 70 year old male who presented in June 2010  complaining of acute onset chest discomfort associated with resting  shortness of breath.  He had had some intermittent episodes of  exertional shortness of breath over the preceding months, but this  episode ultimately prompted him to call EMS and come to the emergency  department for evaluation.  His baseline EKG showed nonspecific ST-  segment changes and cardiac enzymes were abnormal and consistent with a  recent ST-segment elevation myocardial infarction.  He was subsequently  admitted for further workup which included cardiac catheterization by  Dr. Antoine Poche.  This showed diffuse three-vessel coronary artery disease  with preserved left ventricular function.  At that time, a Cardiac  Surgery consultation was obtained and the patient was seen by  Dr.  Tressie Stalker for consideration of revascularization.  Dr. Cornelius Moras felt  that the patient would benefit from CABG, however, due to his long  history of tobacco abuse and severe COPD with active bronchitis,  Pulmonary Critical Care consult was obtained and it was felt that his  pulmonary status should be optimized medically prior to proceeding with  surgery.  He was treated aggressively medically and did return to the  office for follow up with Dr. Cornelius Moras.  He has recovered well from his  previous admission and further discussion was entertained regarding  proceeding with surgery.  All risks, benefits, and alternatives were  explained to the patient and he agreed to proceed.   HOSPITAL COURSE:  [name] was admitted to [facility] on November 03, 2008.  He was taken to the operating room and underwent CABG x4 as  described above.  Please see previously dictated operative report for  complete details of surgery.  He tolerated the procedure well and was  transferred to the SICU in stable condition.  He was able to be  extubated shortly after surgery.  He was hemodynamically stable and  doing well on postop  day #1.  He did remain in the unit for aggressive  pulmonary treatment and observation.  Postoperatively, his course was  complicated by a mild ileus which was treated conservatively and it  subsequently resolved.  He also has had postoperative atrial  fibrillation and was started on IV amiodarone.  He did convert to normal  sinus rhythm and since that time has been switched to p.o. dose of  amiodarone.  He continues to remain in sinus rhythm and has been  afebrile with stable vital signs.  Overall, he has progressed as  expected.  He has been transferred to the Step-Down Unit.  He is walking  with Cardiac Rehab Phase I and is making good progress.  He has been  somewhat volume overloaded and has been started on Lasix to which he is  responding well.  He presently is near his  preoperative weight with only  minimal lower extremity edema on physical exam.  His incisions are all  healing well.  He is tolerating a regular diet and is having normal  bowel function.  His labs on postop day #4 show a hemoglobin of 9.9,  hematocrit 28.4, platelets 194, and white count 9.9.  Sodium 130,  potassium 3.2 which has been replaced, BUN 6, and creatinine 0.93.  His  chest x-ray shows small bilateral pleural effusions.  He is maintaining  O2 sats of greater than 90% on room air.  It is felt that if he  continues to remain stable over the next 24 hours, he will hopefully be  ready for discharge home on November 08, 2008.   DISCHARGE MEDICATIONS:  1. Enteric-coated aspirin 81 mg daily.  2. Plavix 75 mg daily.  3. Amlodipine 5 mg daily.  4. Lopressor 25 mg b.i.d.  5. Oxycodone 5 mg 1-2 q.4 h. p.r.n. for pain.  6. Amiodarone 400 mg b.i.d. x1 week, then 200 mg b.i.d.  7. Lasix 40 mg daily x1 week.  8. Potassium 20 mEq daily x1 week.  9. Mucinex 600 mg b.i.d.  10.Wellbutrin 150 mg daily.  11.Lipitor 40 mg at bedtime.  12.DuoNeb b.i.d.  13.Pulmicort inhaler 2 puffs b.i.d.  14.Albuterol 2 puffs p.r.n.  15.Ativan 0.5 mg at bedtime.   DISCHARGE INSTRUCTIONS:  He is asked to refrain from driving, heavy  lifting, or strenuous activity.  He may continue ambulating daily and  using his incentive spirometer.  He may shower daily and clean his  incisions with soap and water.  He will continue a low-fat, low-sodium  diet.  He has been counseled regarding smoking cessation.   DISCHARGE FOLLOWUP:  He will need to make an appointment to see Dr.  Jens Som back in the office in 2 weeks.  He will then follow up on  December 04, 2008 with Dr. Cornelius Moras with a chest x-ray from [facility].  In the interim, if he experiences any problems or has  questions, he is asked to contact our office.      Coral Ceo, P.A.      Salvatore Decent. Cornelius Moras, M.D.  Electronically Signed   GC/MEDQ  D:   11/07/2008  T:  11/08/2008  Job:  [id number]   cc:   Madolyn Frieze. Jens Som, MD, [facility]  [facility] Office

## 2010-09-03 NOTE — Consult Note (Signed)
NAME:  [name] NO.:  [id number]   MEDICAL RECORD NO.:  [id number]          PATIENT TYPE:  INP   LOCATION:  2923                         FACILITY:  [facility]   PHYSICIAN:  Madolyn Frieze. Jens Som, MD, FACCDATE OF BIRTH:  1940-10-20   DATE OF CONSULTATION:  DATE OF DISCHARGE:                                 CONSULTATION   PRIMARY CARE PHYSICIAN:  None.   PRIMARY CARDIOLOGIST:  Madolyn Frieze. Jens Som, MD, [facility]   CHIEF COMPLAINT:  Shortness of breath with elevated cardiac enzymes.   HISTORY OF PRESENT ILLNESS:  [name] is a 70 year old male with no  previous history of coronary artery disease.  He came in with shortness  of breath and received IV nitroglycerin, aspirin 81 mg x4, and a DuoNeb.  His enzymes were elevated and Cardiology was asked to evaluate him.   He has a history of increased dyspnea on exertion recently.  He has also  had a slight nonproductive cough.  He went to bed at 3 a.m. today which  is his usual time and as soon as he lie down, he felt significant  orthopnea.  He got up, but the shortness of breath continued.  It did  not resolve.  He had no chest pain.  He did not try anything except  sitting in from of the air conditioner and walking around outside.  He  did not notice himself to be wheezing and his cough was more than usual  with a thick-brown phlegm.  EMS was called and they gave him the CPAP,  which helped.  He had a nebulizer in the emergency room and now on a  nasal cannula.  His respiratory status feels normal to him.  He never  had any chest pain, but his cardiac enzymes are elevated and his EKG is  abnormal.  He is currently resting comfortably.   PAST MEDICAL HISTORY:  His last physical was 25 years ago.  At that  time, he was slightly hypertensive, but the doctor told him it was  probably because of the situation.  He has a history of heavy tobacco  use and a possible history of coronary artery disease, but no other  medical  conditions of which he is aware.   SURGICAL HISTORY:  None.   ALLERGIES:  No known drug allergies.   MEDICATIONS:  None except for occasional aspirin or ibuprofen.   SOCIAL HISTORY:  He lives alone in [place] and is retired from  Insurance account manager.  He has a greater than 50-pack-year history of tobacco use  and says he has quit today.  He denies any history of alcohol or drug  abuse.  He is a widower.   FAMILY HISTORY:  His mother died at 51 with cirrhosis and his father  died at 60.  His father took a nap on the couch and was found dead on  the floor beside the couch with no other information available and no  autopsy performed, although the family was told it was heart attack.  There are no siblings with coronary artery disease.   REVIEW OF SYSTEMS:  He  denies fevers, chills, or sweats.  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  He has weakness that is slight  in his left lower extremity, which is also shorter than the right, but  no arm weakness and he has no history of CVA.  He states as a child.  He  was told he either had cerebral palsy or polio.  He chronically limps  because of his left leg being shorter, but denies chronic arthralgias or  pain.  He denies GI symptoms.  He is wheezing, but states he never hears  himself wheeze.  He denies PND, edema, or palpitations on a regular  basis.  Full 14-point review of systems is otherwise negative.   PHYSICAL EXAMINATION:  VITAL SIGNS:  Temperature is 98.1, blood pressure  169/78, pulse 86, respiratory rate 26, O2 saturation 98% on 60% O2,  although he is currently 99% on 2 liters.  GENERAL:  He is a well-developed, overweight white male in no acute  distress.  HEENT:  Essentially normal.  NECK:  He has no lymphadenopathy, thyromegaly, or JVD, but he has a  radiation of his murmur to both carotids.  CV:  His heart is regular in rate and rhythm with an S1, S2 and a 3/6  systolic ejection murmur is noted, loudest at the right  and left upper  sternal borders.  Distal pulses are intact in all four extremities at 2  to 3+.  LUNGS:  He has wheezes and some rhonchi.  SKIN:  No rashes or lesions are noted.  ABDOMEN:  Soft and nontender with active bowel sounds.  EXTREMITIES:  There is no cyanosis, clubbing, or edema noted.  MUSCULOSKELETAL:  There is no joint deformity or effusions and no spine  or CVA tenderness, but his left leg is approximately 2 inches shorter  than his right.  NEUROLOGIC:  He is alert and oriented with  cranial nerves II through  XII grossly intact and gait not observed.   Chest x-ray shows borderline cardiac size with vascular congestion, but  no acute disease.   EKG; sinus rhythm, rate 85 with diffuse inferolateral ST changes.   LABORATORY VALUES:  Hemoglobin 16.5, hematocrit 48.8, WBCs 8.2,  platelets 123.  INR 1.1, PTT 25, D-dimer 2.41.  Sodium 140, potassium  3.6, chloride 109, CO2 24, BUN 16, creatinine 0.73, glucose 149.  Other  CMET values are within normal limits except for an SGOT minimally  elevated at 52, CK-MB 255/36.7 with an index of 14.4, troponin I 0.92.  Point-of-care markers also elevated except for myoglobin, which is  within normal limits.  BNP 142, total cholesterol 244, triglycerides  143, HDL 32 LDL 183.   IMPRESSION:  [name] was seen today by Dr. Jens Som.  He is a 67-year-  old male with no prior history with a non-ST segment elevation  myocardial infarction.  He has not seen a physician recently and has a  long history of tobacco use.  He has chronic dyspnea on exertion and a  history of chest tightness with exertion.  Over the last 4-5 days, he  has an increased dyspnea, orthopnea, and some pedal edema, but no chest  pain, fever, chills, or cough.  His EKG is sinus rhythm with left  ventricular hypertrophy and diffuse ST depression.  His cardiac enzymes  are elevated and his SGOT is up as well.  He apparently has a recent  myocardial infarction, but no  chest pain.  We will plan aspirin,  heparin, and low-dose Lopressor.  His pulmonary exam will be followed  closely on a beta-blocker.  We will also make sure he is on a statin.  Cardiac catheterization is indicated and the risks and benefits were  discussed with the patient who agrees to proceed.  He is strongly  encouraged to discontinue tobacco as he most certainly has chronic  obstructive pulmonary disease.  An echocardiogram is ordered for left  ventricular function and possibly  mild aortic stenosis on exam.  His D-dimer is elevated, but cardiac  evaluation is primary at this time and a V/Q scan or CT angiogram can be  performed if there is still concern for pulmonary embolism once cardiac  evaluation is completed.      Theodore Demark, PA-C      Madolyn Frieze. Jens Som, MD, [facility]  Electronically Signed    RB/MEDQ  D:  10/06/2008  T:  10/07/2008  Job:  [id number]

## 2010-09-03 NOTE — Cardiovascular Report (Signed)
NAME:  [name], [name] NO.:  [id number]   MEDICAL RECORD NO.:  [id number]          PATIENT TYPE:  INP   LOCATION:  2923                         FACILITY:  [facility]   PHYSICIAN:  Rollene Rotunda, MD, FACCDATE OF BIRTH:  08/31/1940   DATE OF PROCEDURE:  10/06/2008  DATE OF DISCHARGE:                            CARDIAC CATHETERIZATION   PROCEDURE:  Left heart catheterization/coronary arteriography.   INDICATIONS:  Evaluate the patient with non-Q-wave myocardial  infarction.   PROCEDURE NOTE:  Left heart catheterization was performed via the right  femoral artery.  Aorta was cannulated using antral puncture.  A #5-  [place] arterial sheath was inserted via the modified Seldinger  technique.  Preformed Judkins pigtail catheter utilized.  Of note, he  had difficulty advancing the wire in the right femoral.  I used a Wholey  wire and all exchanges were made via a long exchange wire.  The patient  tolerated procedure well and left the lab in stable addition.   RESULTS:  Hemodynamics:  LV 162/13, AO 140/59.  Coronaries:  Left main  did not exist.  There were separate ostia.  The LAD was diffusely  diseased throughout the proximal, mid, and distal segments.  This was  predominantly nonobstructive.  However, there was a focal mid 60-70%  stenosis after the fourth small diagonal.  The first through fourth  diagonals were small with no high-grade lesions.  The circumflex in the  AV groove had diffuse plaque.  There was a mid long 50% stenosis with a  focal 95% stenosis.  The first obtuse marginal had proximal and mid 99%  stenosis.  This was a large vessel and most likely the culprit.  Posterolateral was large and had a long proximal 60-70% stenosis.  Right  coronary artery is dominant.  There was diffuse plaque with long  proximal mid 80% stenosis.  PDA was moderate sized with obstructive  diffuse disease.   Left ventriculogram:  Left ventriculogram was obtained in the RAO  projection.  The EF 65% with normal wall motion.  Distal aortogram  secondary to difficulty advancing the guidewire.  The vessel was  underfilled.  It appeared to be diffuse plaque but it could not quantify  the degree.   CONCLUSION:  Severe three-vessel coronary artery disease.  Preserved  ejection fraction.   PLAN:  Cardiothoracic surgery has been consulted for probable surgical  revascularization.     Rollene Rotunda, MD, [facility]  Electronically Signed     Rollene Rotunda, MD, [facility]  Electronically Signed   JH/MEDQ  D:  10/06/2008  T:  10/07/2008  Job:  [id number]   cc:   Madolyn Frieze. Jens Som, MD, [facility]

## 2010-09-03 NOTE — Discharge Summary (Signed)
NAME:  [name], SCHEPERS NO.:  [id number]   MEDICAL RECORD NO.:  [id number]          PATIENT TYPE:  INP   LOCATION:  2014                         FACILITY:  [facility]   PHYSICIAN:  Waldemar Dickens, MD     DATE OF BIRTH:  [birth year]   DATE OF ADMISSION:  10/06/2008  DATE OF DISCHARGE:  10/13/2008                               DISCHARGE SUMMARY   DISCHARGE DIAGNOSES:  1. Non-ST-segment elevation myocardial infarction.  2. Hypertension.  3. Chronic obstructive pulmonary disease.  4. Tobacco abuse.  5. Hyperlipidemia.   DISCHARGE MEDICATIONS:  1. Aspirin 325 mg p.o. daily.  2. Mucinex 600 mg p.o. b.i.d.  3. Amlodipine 10 mg p.o. daily.  4. Wellbutrin 150 mg p.o. daily.  5. Imdur 30 mg p.o. daily.  6. Lipitor 40 mg p.o. daily.  7. DuoNeb 2.5/0.5 mg 1 neb b.i.d.  8. Pulmicort 200 mcg inhaler 2 puffs after each nebulizer treatment.  9. Albuterol HFA 2 puffs q.4-6 h p.r.n. for shortness of breath.  10.Nitroglycerin 0.4 mg sublingual q.5 minutes p.r.n. for chest pain      with maximum 3 doses.   DISPOSITION AND FOLLOWUP:  [name] was discharged from the hospital  on October 13, 2008, in stable and improved condition.  His shortness of  breath has significantly improved.  No chest pain.  He will have an  appointment with Dr. Barry Dienes at his office on October 30, 2008, at 10:45 a.m.  At that time, he will be evaluated for his CABG surgery and may set up  the surgery time.  He also has an appointment with Dr. Jens Som on November 14, 2008, at 10:15 a.m. to check his cardiac symptoms and adjust his  medications after his CABG surgery. On discharge, we also told the  patient that if the patient has the chest pain or shortness of breath,  he should call 911 and come to the ED as soon as possible.   CONSULTATIONS:  Salvatore Decent. Cornelius Moras, MD of Cardiothoracic Surgery and Madolyn Frieze. Jens Som, MD, [facility] of cardiology.   PROCEDURE PERFORMED:  1. Chest x-ray on October 06, 2008, shows a borderline  heart size with      vascular congestion.  No focal airspace disease.  2. Chest CT on October 07, 2008, shows no evidence of acute pulmonary      emboli.  Multiple focal bilateral upper lobe airspace disease      suspicious of pneumonia.  3. Cardiac catheterization on October 06, 2008, shows a severe 3-vessel      coronary artery disease, preserved ejection fraction of 65% with      normal wall motion.  4. Two-D echo on October 06, 2008, shows EF 55-60% with moderate      diastolic dysfunction with evidence for elevated left ventricular      filling pressure.  Posterior wall may be hypokinetic, but poor      acoustic windows for assessment for regional wall motion.   ADMISSION HISTORY:  The patient is a 70 year old Caucasian male with  past medical history of smoking abuse, hypertension, came to the ED  presenting with a shortness of breath and fatigue.  The shortness of  breath and fatigue had been worsening for the past several weeks before  admission, but in the morning of the admission date about 3 a.m., the  patient started to have shortness of breath and he could not lie flat  and had to stay up to get good breathing, so he called the EMS and was  brought to the ED.  He had no chest pain, palpitation, dizziness, leg  swelling, or pain.  No fever or chills.  Denies use of any drugs.   PHYSICAL EXAMINATION ON ADMISSION:  VITAL SIGNS:  Temperature 98.1,  blood pressure 169/78, heart rate 86, respiration rate 26, O2 sat 100%  on CPAP, then changed to 96% on 2 L of nasal cannula.  GENERAL:  The patient in no acute distress.  NECK:  Supple.  No JVD or thyroid enlargement.  ENT: Moist mucosa.  No exudates.  LUNGS:  Bilateral wheezing.  No crackles.  HEART:  Regular rate and rhythm.  No murmur.  ABDOMEN:  Soft.  Bowel sounds positive.  No tenderness.  EXTREMITIES:  No edema or tenderness.  NEUROLOGIC:  Alert and oriented x3.  Cranial nerves II through XII  intact.  No focal neurological  findings.   ADMISSION LABORATORY STUDIES:  CBC:  White blood cell 8.2, hemoglobin  16.5, MCV 89.2, platelet 123.  Sodium 140, potassium 3.6, chloride 109,  bicarb 24, BUN 16, creatinine 0.73, glucose 149.  Liver function normal.  BNP 142.  Troponin 0.92.  TSH and 1.276.  Urine drug screening test  positive for benzodiazepine.  D-dimer 2.41.   HOSPITAL COURSE:  1. Non-ST-elevation myocardial infarction.  The patient has recent      shortness of breath with fatigue.  Then, the patient had worsening      shortness of breath, but no chest pain. When he arrived at the ED      and his troponin level was elevated, and EKG shows diffuse ST wave      depression. At that time, Cardiology was consulted, and the patient      was given aspirin, nitroglycerin, and heparin treatment and also      was sent for cardiac catheterization because of concerning of an      acute non-ST-elevation myocardial infarction. The cardiac      catheterization showed severe 3-vessel disease with preserved      systolic function.  Because of this severe cardiovascular disease,      Cardiothoracic Surgery consult was requested and Dr. Cornelius Moras came to      see the patient and decided that for the long-term better outcome,      he needs CABG surgery in the future.  Because of severe COPD and      possible pneumonia, the patient needed a further recovery from her      pulmonary function.  Because the patient had been stable after      catheterization, he was discharged to home in stable condition.  He      needs to continue to take his aspirin and blood pressure      medication.  He will have an appointment with Dr. Cornelius Moras on October 30, 2008.  At that time, Dr. Cornelius Moras will check his status to set up his      surgery date.  After surgery, Dr. Jens Som will evaluate of his      cardiac status and check  his blood pressure.   1. COPD.  The patient has a history of tobacco abuse and on admission,      he has diffuse wheezing with  shortness of breath.  The CTA was      negative, even D-dimer is elevated.  The CTA also shows bilateral      airspace disease suspicious for pneumonia.  Pulmonary function test      shows moderate COPD. So, CCM consult was requested and started on      antibiotics with prednisone for 5 days besides inhaling      broncodilators.  During hospitalization, his breathing was      significantly improved with very mild wheezing appreciated.  After      discharge, he will continue to take bronchodilator and steroid      inhaler.   1. Hypertension.  During the hospitalization, the patient had been      given amlodipine and Imdur without beta-blocker because the patient      has COPD.  During the hospitalization, his blood pressure had been      under control.  On discharge, his blood pressure was 151/70 and      needs to continue to take these medications.   1. Tobacco abuse.  We have provided smoking cessation counseling and      the patient decided to quit smoking.  We did not give the patient      nicotine patch concerning of acute stage in non-ST-elevation      myocardial infarction.   1. Hyperlipidemia.  During the hospitalization, the patient's LDL was      124, HDL was 29, and cholesterol 166, triglycerides 65.  The      patient was given Lipitor.  He will continue to take this      medication after discharge.   DISCHARGE VITAL SIGNS:  Temperature 96.5, blood pressure 151/70, heart  rate 55, respiration rate 20, O2 sat 98 on 2 L.   DISCHARGE LABORATORY DATA:  White blood cell 8.6, hemoglobin of 14,  platelet 161.  Sodium 140, potassium 3.7, chloride 107, bicarb 30, BUN  13, creatinine 0.72, glucose 92.      Jackson Latino, MD  Electronically Signed      Waldemar Dickens, MD  Electronically Signed    ZY/MEDQ  D:  10/16/2008  T:  10/17/2008  Job:  [id number]   cc:   Salvatore Decent. Cornelius Moras, M.D.  Madolyn Frieze Jens Som, MD, [facility]

## 2010-09-03 NOTE — Consult Note (Signed)
NAME:  [name], SCHERTZER NO.:  [id number]   MEDICAL RECORD NO.:  [id number]          PATIENT TYPE:  INP   LOCATION:  2923                         FACILITY:  [facility]   PHYSICIAN:  Salvatore Decent. Cornelius Moras, M.D. DATE OF BIRTH:  August 15, 1940   DATE OF CONSULTATION:  10/07/2008  DATE OF DISCHARGE:                                 CONSULTATION   REQUESTING PHYSICIAN:  Rollene Rotunda, MD, [facility]   REASON FOR CONSULTATION:  Severe three-vessel coronary artery disease,  status post non-ST-segment elevation myocardial infarction.   HISTORY OF PRESENT ILLNESS:  [name] is a 70 year old obese white  male with no previous history of coronary artery disease, but risk  factors notable for history of hypertension that has been untreated as  well as longstanding tobacco abuse.  The patient has not seen a  physician in years other than occasional visit to [facility].  The patient was told he had high blood pressure several years ago.  He  describes a several-month history of progressive symptoms of exertional  shortness of breath.  On the early morning hours of yesterday, the  patient was not feeling well and went to bed.  He then developed severe  acute onset resting shortness of breath with mild tightness across his  chest.  Symptoms persisted, ultimately prompting him to summon EMS.  He  was brought to the emergency room where chest x-ray revealed borderline  increased heart size with some vascular congestion and evidence of  chronic bronchitic changes.  Baseline electrocardiogram revealed normal  sinus rhythm with diffuse nonspecific ST-segment changes.  Cardiac  enzymes were abnormal and consistent with likely recent or ongoing non-  ST-segment elevation myocardial infarction.  The patient was admitted to  the hospital, started on IV heparin and nitroglycerin and subsequently  taken for cardiac catheterization yesterday afternoon.  Catheterization  performed by Dr. Antoine Poche  demonstrates diffuse three-vessel coronary  artery disease with severe three-vessel disease and preserved left  ventricular function.  Cardiothoracic Surgery consultation was  requested.   REVIEW OF SYSTEMS:  GENERAL:  The patient reports normal appetite.  He  has not been gaining nor losing weight recently.  The patient has had  progressive fatigue.  CARDIAC:  Notable for a several-month history of  progressive symptoms of exertional shortness of breath without chest  pain, ultimately prompt developing into severe episode of resting  shortness of breath with mild vague tightness across the chest early  yesterday morning that prompted admission.  The patient reports  orthopnea.  He denies lower extremity edema, syncope, or palpitations.  RESPIRATORY:  Notable for shortness of breath as well as intermittent  cough, sometimes productive of whitish sputum.  The patient continues to  smoke in excess of 1 pack of cigarettes per day and has done so for many  years.  The patient denies hemoptysis or wheezing.  GASTROINTESTINAL:  Negative.  The patient reports no significant difficulty swallowing.  He  reports occasional constipation.  He denies hematochezia, hematemesis,  or melena.  MUSCULOSKELETAL:  Notable and that the patient walks with a  limp and his left lower  extremity is shorter than the right.  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  The patient denies significant arthritis or arthralgias.  NEUROLOGIC:  Negative.  The patient denies symptoms suggestive of previous stroke or  TIA.  GENITOURINARY:  Negative.  INFECTIOUS:  Negative.  HEENT:  Negative.   PAST MEDICAL HISTORY:  1. Hypertension, untreated.  2. Longstanding tobacco abuse.   PAST SURGICAL HISTORY:  None.   SOCIAL HISTORY:  The patient is widowed, and lives alone here in  [place].  He has 1 son, who lives in the [place], and he has  an uncle, who lives nearby as well.  He  smokes in excess of 1 pack of  cigarettes per day.  The patient reports a previous history of heavy  alcohol use, but he quit drinking many years ago.   MEDICATIONS PRIOR TO ADMISSION:  None.   DRUG ALLERGIES:  None known.   PHYSICAL EXAMINATION:  GENERAL:  The patient is an obese white male, who  appears somewhat older than stated age, in no acute distress.  VITAL SIGNS:  Blood pressure stable, and he is afebrile.  He is in  normal sinus rhythm.  HEENT:  Grossly unrevealing.  NECK:  There is no palpable lymphadenopathy.  There are no carotid  bruits.  CHEST:  Auscultation of the chest reveals inspiratory crackles and a few  expiratory wheezes bilaterally.  Breath sounds are symmetrical.  CARDIOVASCULAR:  Regular rate and rhythm.  No murmurs, rubs, or gallops  are appreciated.  ABDOMEN:  Moderately obese, but soft and nontender.  There are no  palpable masses.  Bowel sounds are present.  EXTREMITIES:  Warm and adequately perfused.  There is no lower extremity  edema.  Distal pulses are palpable in the posterior tibial position.  There are changes of mild chronic venous insufficiency in both lower  legs.  There are no obvious varicosities.  SKIN:  Clean, dry, and healthy appearing throughout.  RECTAL and GU:  Both deferred.  NEUROLOGIC:  Grossly nonfocal and symmetrical.   DIAGNOSTIC TEST:  Cardiac catheterization performed by Dr. Antoine Poche is  reviewed.  This demonstrates diffuse coronary artery disease with hard  calcified diffusely diseased vessels throughout and relatively poor  target vessels for grafting.  There is severe diffuse calcified plaque  in the proximal mid and distal left anterior descending coronary artery  with focal 70% stenosis of the distal left anterior descending coronary  artery just after takeoff of the small diagonal branch.  The diagonal  branches are all small and without significant disease.  The left  circumflex vessel is a large vessel, which gives  off a large first  circumflex marginal branch.  There is 95-99% focal stenosis of a large  first circumflex marginal branch that is somewhat hazy in appearance.  There is 95-99% stenosis of the distal left circumflex coronary artery  before it gives rise to a medium large posterolateral branch.  There is  right dominant coronary circulation.  The right coronary artery is  diffusely disease with long segment 70-80% stenosis of the proximal and  midportion of this vessel.  The posterior descending coronary artery is  also diffusely diseased with subtotal occlusion in the distal portion of  the vessel.  Left ventricular function appears normal with no  significant wall motion abnormalities.   IMPRESSION:  Severe three-vessel coronary artery disease with diffuse  disease, calcified vessels, relatively poor-target vessels for grafting.  The patient has  normal left ventricular function and relatively poor  coronary anatomy for percutaneous coronary intervention.  I suspect that  surgical revascularization would come with the best long-term prognosis,  but it will also come with considerable risk due to the potential for  incomplete revascularization, the potential for premature graft disease  or recurrent coronary artery disease, and significant underlying  respiratory risk with likely severe chronic obstructive pulmonary  disease as well as longstanding tobacco abuse and active bronchitis.   PLAN:  I have discussed matters at length with Mr. Pascucci and  alternative treatment strategies have been discussed.  We will obtain CT  angiogram of the chest to rule out pulmonary embolus to further  characterize his chronic obstructive pulmonary disease.  We will obtain  pulmonary function test on Monday as well as arterial blood gas on room  air.  We will continue to follow along and consider high-risk surgical  intervention later this week.      Salvatore Decent. Cornelius Moras, M.D.  Electronically  Signed     CHO/MEDQ  D:  10/07/2008  T:  10/08/2008  Job:  [id number]   cc:   Rollene Rotunda, MD, [facility]  Madolyn Frieze. Jens Som, MD, [facility]

## 2010-09-03 NOTE — Op Note (Signed)
NAME:  [name], [name] NO.:  [id number]   MEDICAL RECORD NO.:  [id number]          PATIENT TYPE:  INP   LOCATION:  2304                         FACILITY:  [facility]   PHYSICIAN:  Salvatore Decent. Cornelius Moras, M.D. DATE OF BIRTH:  02/15/1941   DATE OF PROCEDURE:  11/03/2008  DATE OF DISCHARGE:                               OPERATIVE REPORT   PREOPERATIVE DIAGNOSIS:  Severe three-vessel coronary artery disease.   POSTOPERATIVE DIAGNOSIS:  Severe three-vessel coronary artery disease.   PROCEDURE:  Median sternotomy for coronary artery bypass grafting x4  (left internal mammary artery to distal left anterior descending  coronary artery, saphenous vein graft to first circumflex marginal  branch with Y graft sequential to left posterolateral branch, saphenous  vein graft to distal right coronary artery, endoscopic saphenous vein  harvest from right thigh and right lower leg).   SURGEON:  Salvatore Decent. Cornelius Moras, MD   ASSISTANT:  Rowe Clack, PA-C   ANESTHESIA:  General.   BRIEF CLINICAL NOTE:  The patient is a 70 year old male with no previous  history of coronary artery disease, but risk factors notable for history  of hypertension that has been untreated and longstanding tobacco abuse.  The patient was admitted in June 2010 with an acute non-ST-segment  elevation myocardial infarction.  He was also noted to be suffering from  acute exacerbation of tracheobronchitis with longstanding heavy tobacco  abuse and underlying chronic obstructive pulmonary disease.  Cardiac  catheterization performed at that time demonstrated severe three-vessel  coronary artery disease.  The patient was seen in consultation by the  Pulmonary Critical Care Team and subsequently put on aggressive medical  therapy to clear his bronchitis and improve his respiratory status.  The  patient has done well since then and now returns for elective surgical  revascularization.  A full consultation note has been  dictated  previously.  The patient has been counseled at length regarding the  indications, risks, and potential benefits of surgery.  Alternative  treatment strategies have been discussed.  He understands and accepts  all associated risks and desires to proceed with Surgery as described.   OPERATIVE FINDINGS:  1. Normal left ventricular systolic function with mild left      ventricular hypertrophy.  2. Good-quality left internal mammary artery conduit for grafting.  3. Small caliber, but otherwise satisfactory quality saphenous vein      conduit for grafting.  4. Diffuse distal coronary artery disease with relatively poor target      vessels for grafting.   OPERATIVE NOTE IN DETAIL:  The patient was brought to the operating room  on the above-mentioned date and central monitoring was established by  the Anesthesia Team under the care and direction of Dr. Adonis Huguenin.  Specifically, a Swan-Ganz catheter was placed through the right internal  jugular approach.  A radial arterial line was placed.  Intravenous  antibiotics were administered.  Following induction with general  endotracheal anesthesia, a Foley catheter was placed.  The patient's  chest, abdomen, both groins, and both lower extremities were prepared  and draped in a sterile manner.  Baseline  transesophageal echocardiogram  was performed by Dr. Krista Blue.  This demonstrates normal left ventricular  systolic function with mild left ventricular hypertrophy.   A median sternotomy incision was performed and a left internal mammary  artery was dissected from the chest wall and prepared for bypass  grafting.  The left internal mammary artery is good-quality conduit.  After systemic heparinization, the distal left internal mammary artery  was transected and notably has excellent flow.  Simultaneously,  saphenous vein was obtained from the patient's right thigh and the upper  portion of the right lower leg using endoscopic vein harvest  technique.  The saphenous vein was somewhat small-caliber, but otherwise  satisfactory quality conduit for grafting.  After the saphenous vein has  been removed, the small incisions in the right lower extremity were all  closed in multiple layers with a running absorbable suture.   The pericardium was opened.  The ascending aorta was mildly diseased  with atherosclerosis.  The ascending aorta was cannulated uneventfully  for cardiopulmonary bypass.  The patient has significant barrel-shaped  chest and heart was somewhat rotated towards the right side.  As such,  access to the right atrium was extremely limited.  Subsequently, a small  stab incision was made in the right groin and the right common femoral  vein was cannulated with the Seldinger technique.  A long flexible  guidewire was advanced up through the inferior vena cava through the  right atrium into the superior vena cava using transesophageal  echocardiogram to verify its position.  Subsequently, the right femoral  vein was dilated with serial dilators and a 23 x 25-French long femoral  venous cannula was advanced over the guidewire up through the inferior  vena cava through the right atrium until the tip of the cannula extends  into the superior vena cava.   Cardiopulmonary bypass was begun.  [employer] Centrifugal pump assisted-  venous drainage was employed.  Exposure was notably excellent.  The  surface of the heart was inspected.  There was diffuse coronary artery  disease with relatively poor target vessels for grafting.  There was  codominant coronary circulation.  A temperature probe was placed in the  left ventricular septum and a cardioplegic cannula was placed in the  ascending aorta.  The patient was allowed to cool passively to 32  degrees systemic temperature.  The aortic crossclamp was applied and  cold blood cardioplegia was delivered initially in the antegrade fashion  through the aortic root.  Iced saline  slush was applied for topical  hypothermia.  The initial cardioplegic arrest was rapid with excellent  diastolic arrest.  Repeat doses of cardioplegia were administered  intermittently throughout the crossclamp portion of the operation both  through the aortic root and down the subsequently placed vein grafts to  maintain completely flat electrocardiogram and septal myocardial  temperature less than 15 degrees centigrade.   The following distal coronary anastomoses were performed:  1. The distal right coronary artery was grafted with a saphenous vein      graft in an end-to-side fashion.  This vessel measured 2.0 mm in      diameter and it was a poor quality target vessel for grafting.  It      was diffusely diseased proximally and distally.  2. The posterolateral branch of the distal left circumflex coronary      artery was grafted with a saphenous vein graft in an end-to-side      fashion.  This vessel measured 1.3 mm  in diameter and was a fair-to-      poor quality target vessel for grafting.  3. The circumflex marginal branch was grafted with a saphenous vein      graft in an end-to-side fashion.  This vessel measured 1.5 mm in      diameter and was a poor quality target vessel for grafting.  It was      diffusely diseased.  The saphenous vein graft to the circumflex      marginal branch and to the posterolateral branch of the distal left      circumflex coronary artery were connected as a Y graft due to      insufficient length of saphenous vein to facilitate direct      reimplantation on the aorta of all three good vein grafts.  4. The distal left anterior descending coronary artery was grafted      with left internal mammary artery in an end-to-side fashion.  This      vessel measured 1.5 mm in diameter and was a fair-to-poor quality      target vessel for grafting.  It was also diffusely diseased.   Both proximal saphenous vein anastomoses were performed directly to the   ascending aorta prior to removal of the aortic crossclamp.  Left  ventricular septal temperature rises rapidly with reperfusion of the  left internal mammary artery.  The aortic crossclamp was removed after  total crossclamp time of 100 minutes.  The heart began to beat  spontaneously without need for cardioversion.  All proximal and distal  coronary anastomoses were inspect for hemostasis and appropriate graft  orientation.  Epicardial pacing wires were fixed to the right  ventricular free wall into the right atrial appendage.  The patient was  rewarmed to 37 degrees centigrade temperature.  The patient was weaned  from cardiopulmonary bypass without difficulty.  The patient's rhythm at  separation from bypass was slow junctional rhythm.  AV sequential pacing  was employed.  Total cardiopulmonary bypass time for the operation was  130 minutes.  Followup transesophageal echocardiogram performed by Dr.  Krista Blue after separation from bypass demonstrates normal left ventricular  systolic function with no regional wall motion abnormalities.   Protamine was administered to reverse the anticoagulation.  The aortic  cannula was removed uneventfully.  The femoral venous cannula was  removed and manual pressure was held on the groin for 30 minutes.  The  mediastinum was irrigated with saline solution.  Meticulous surgical  hemostasis was ascertained.  The mediastinum and left pleural space were  drained using three chest tubes exited through separate stab incisions  inferiorly.  The soft tissues anterior to the aorta were reapproximated  loosely.  The sternum was closed using double-strength sternal wire.  The soft tissues anterior to the sternum were closed in multiple layers  and the skin was closed with running subcuticular skin closure.   The patient tolerated the procedure well and was transported to the  surgical intensive care unit in stable condition.  There were no  intraoperative  complications.  All sponge, instrument, and needle counts  were verified correct at completion of the operation.  No blood products  were administered.      Salvatore Decent. Cornelius Moras, M.D.  Electronically Signed     CHO/MEDQ  D:  11/03/2008  T:  11/04/2008  Job:  [id number]   cc:   Madolyn Frieze. Jens Som, MD, [facility]  Rollene Rotunda, MD, [facility]

## 2010-09-11 ENCOUNTER — Other Ambulatory Visit: Payer: Self-pay

## 2010-09-11 DIAGNOSIS — N529 Male erectile dysfunction, unspecified: Secondary | ICD-10-CM

## 2010-09-11 MED ORDER — OXYCODONE-ACETAMINOPHEN 5-325 MG PO TABS: 1.0000 | ORAL_TABLET | Freq: Two times a day (BID) | ORAL | Status: DC

## 2010-09-11 MED ORDER — SILDENAFIL CITRATE 50 MG PO TABS: 50.0000 mg | ORAL_TABLET | ORAL | Status: DC | PRN

## 2010-09-11 NOTE — Telephone Encounter (Signed)
Pt called requesting refill of Oxycodone. Pt is also requesting Rx for Viagra. Pt states that this was okay'd by Dr Jens Som with the understanding that he is NOT to use within 24 hours of using Nitroglycerin. Please advise.

## 2010-09-11 NOTE — Telephone Encounter (Signed)
Ok on both - to use viagra as per instructions of cards - erx done for this, printed oxycodone

## 2010-09-12 MED ORDER — SILDENAFIL CITRATE 50 MG PO TABS: 50.0000 mg | ORAL_TABLET | ORAL | Status: DC | PRN

## 2010-09-12 NOTE — Telephone Encounter (Signed)
Pt advised and requested printed Rx to send to mail order pharmacy as it would be cheaper. Rx printed per pt req.  Pt informed, Rxs in cabinet for pt pick up

## 2010-10-10 ENCOUNTER — Other Ambulatory Visit: Payer: Self-pay

## 2010-10-10 NOTE — Telephone Encounter (Signed)
Pt called requesting refill of medication to be pick up tomorrow

## 2010-10-11 MED ORDER — OXYCODONE-ACETAMINOPHEN 5-325 MG PO TABS: 1.0000 | ORAL_TABLET | Freq: Two times a day (BID) | ORAL | Status: DC

## 2010-10-11 NOTE — Telephone Encounter (Signed)
Pt informed, Rx in cabinet for pt pick up  

## 2010-10-14 ENCOUNTER — Telehealth: Payer: Self-pay | Admitting: Internal Medicine

## 2010-10-14 NOTE — Telephone Encounter (Signed)
Ok to generate letter for pt asking that he be excused from jury duty for medical reasons - thanks

## 2010-10-15 NOTE — Telephone Encounter (Signed)
Letter generated, and mailed to pt home address per his request. Address verified.

## 2010-11-08 ENCOUNTER — Telehealth: Payer: Self-pay | Admitting: *Deleted

## 2010-11-08 MED ORDER — OXYCODONE-ACETAMINOPHEN 5-325 MG PO TABS: 1.0000 | ORAL_TABLET | Freq: Two times a day (BID) | ORAL | Status: DC

## 2010-11-08 NOTE — Telephone Encounter (Signed)
Request for Rx for Oxycodone-APAP 5/325mg 

## 2010-11-08 NOTE — Telephone Encounter (Signed)
Ok done

## 2010-11-08 NOTE — Telephone Encounter (Signed)
Pt Informed Rx ready for p/u.

## 2010-11-12 ENCOUNTER — Other Ambulatory Visit: Payer: Self-pay | Admitting: Cardiology

## 2010-11-12 ENCOUNTER — Encounter: Payer: Self-pay | Admitting: Internal Medicine

## 2010-11-18 ENCOUNTER — Ambulatory Visit: Payer: Self-pay | Admitting: Internal Medicine

## 2010-11-25 ENCOUNTER — Other Ambulatory Visit: Payer: Self-pay | Admitting: *Deleted

## 2010-11-25 MED ORDER — AMLODIPINE BESYLATE 10 MG PO TABS: 10.0000 mg | ORAL_TABLET | Freq: Every day | ORAL | Status: DC

## 2010-12-02 ENCOUNTER — Ambulatory Visit (INDEPENDENT_AMBULATORY_CARE_PROVIDER_SITE_OTHER): Payer: Medicare Other | Admitting: Internal Medicine

## 2010-12-02 ENCOUNTER — Encounter: Payer: Self-pay | Admitting: Internal Medicine

## 2010-12-02 DIAGNOSIS — I2510 Atherosclerotic heart disease of native coronary artery without angina pectoris: Secondary | ICD-10-CM

## 2010-12-02 DIAGNOSIS — R5381 Other malaise: Secondary | ICD-10-CM

## 2010-12-02 DIAGNOSIS — E785 Hyperlipidemia, unspecified: Secondary | ICD-10-CM

## 2010-12-02 DIAGNOSIS — H6120 Impacted cerumen, unspecified ear: Secondary | ICD-10-CM

## 2010-12-02 DIAGNOSIS — D649 Anemia, unspecified: Secondary | ICD-10-CM

## 2010-12-02 DIAGNOSIS — Z1211 Encounter for screening for malignant neoplasm of colon: Secondary | ICD-10-CM

## 2010-12-02 DIAGNOSIS — R5383 Other fatigue: Secondary | ICD-10-CM

## 2010-12-02 NOTE — Assessment & Plan Note (Signed)
Noted periop for 10/2008 CABG - recheck now to verify resolution Lab Results  Component Value Date   WBC 9.9 11/07/2008   HGB 9.9* 11/07/2008   HCT 28.4* 11/07/2008   MCV 88.1 11/07/2008   PLT 194 11/07/2008

## 2010-12-02 NOTE — Assessment & Plan Note (Signed)
On stain + niacin - last lipids/LFTs 04/2010 reviewed Recheck now, titrate as needed

## 2010-12-02 NOTE — Patient Instructions (Addendum)
It was good to see you today. We have washed your ears today for wax removal - if your hearing is not improved, let us know and we will make referral for hearing check as discussed Medications reviewed, no changes at this time. Test(s) ordered today. Your results will be called to you after review (48-72hours after test completion). If any changes need to be made, you will be notified at that time. Please schedule followup in 6-12 months for blood pressure and cholesterol check; call sooner if problems. Will also refer to GI for screening colonoscopy (recommended every 10years after age 12) to look for any early colon cancer changes

## 2010-12-02 NOTE — Assessment & Plan Note (Signed)
CABG 10/2010 following NSTEMI with cath showing severe diffuse CAD, preserved LVEF On med mgmt with ASA, statin, and beta-blocker  Follows annually with cards - last OV reivewed No anginal symptoms

## 2010-12-02 NOTE — Progress Notes (Signed)
  Subjective:    Patient ID: [name], male    DOB: 27-Jun-1940, 70 y.o.   MRN: [id number]  HPI Here for follow up - reviewed chronic medical issues:  HTN - reports compliance with ongoing medical treatment and no changes in medication dose or frequency.  denies adverse side effects related to current therapy. no headache or vision change or weakness, no edema   dyslipidemia - on statin + niacin -reports compliance with ongoing medical treatment and no changes in medication dose or frequency.  denies adverse side effects related to current therapy. no muscle aches or GI signs   CAD - s/p CABG 10/2008 - follows with cards for same -  reports compliance with ongoing medical treatment and no changes in medication dose or frequency.  denies adverse side effects related to current therapy. 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 COPD (chronic obstructive pulmonary disease)   . Cerebrovascular disease   . AAA (abdominal aortic aneurysm)   . 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 Hyperlipidemia   . HTN (hypertension)   . BACK PAIN   . CAROTID ARTERY STENOSIS   . CONGENITAL UNSPEC REDUCTION DEFORMITY LOWER LIMB   . TOBACCO USE, QUIT      Review of Systems  Constitutional: Negative for unexpected weight change.  Respiratory: Negative for shortness of breath.   Cardiovascular: Negative for chest pain.  Neurological: Negative for headaches.        Objective:   Physical Exam BP 128/62  Pulse 64  Temp(Src) 97.9 F (36.6 C) (Oral)  Ht 5\' 11"  (1.803 m)  Wt 219 lb 6.4 oz (99.519 kg)  BMI 30.60 kg/m2  SpO2 95%  Constitutional:  oriented to person, place, and time. appears well-developed and well-nourished. No distress.  HENT: TMs bilaterally obscured with cerumen - after irrigation, TMs clear without effusion or erythema, hearing grossly improved Neck: Normal range of motion. Neck supple. No JVD present. No thyromegaly present.  no bruits Cardiovascular: Normal rate, regular rhythm and normal heart sounds.  No murmur heard. no BLE edema Pulmonary/Chest: Effort normal and breath sounds normal. No respiratory distress. no wheezes.  Neurological: he is alert and oriented to person, place, and time. No cranial nerve deficit. Coordination normal.  Psychiatric: he has a normal mood and affect. behavior is normal. Judgment and thought content normal.    Procedure: wax removal, bilateral Reason: wax impaction, bilateral Risks and benefits of procedure discussed with the patient who agrees to proceed. Ear(s) irrigated with warm water. Large amount of wax removed. Instrumentation with metal ear loop was performed to accomplish wax removal. the patient tolerated procedure well.     Assessment & Plan:  See problem list. Medications and labs reviewed today.

## 2010-12-05 ENCOUNTER — Other Ambulatory Visit: Payer: Self-pay | Admitting: *Deleted

## 2010-12-05 NOTE — Telephone Encounter (Signed)
Pt called requesting refill on his Oxycodone 5/325 mg. Coming in for lab work tomorrow, and want to pick up rx then. Let pt know md is out of office this pm will give msg in the am, and will call him when rx is ready for pick-up.Marland KitchenMarland Kitchen8/15/12@3 :03pm/LMB

## 2010-12-06 ENCOUNTER — Encounter: Payer: Self-pay | Admitting: Gastroenterology

## 2010-12-06 ENCOUNTER — Other Ambulatory Visit (INDEPENDENT_AMBULATORY_CARE_PROVIDER_SITE_OTHER): Payer: Medicare Other

## 2010-12-06 DIAGNOSIS — E785 Hyperlipidemia, unspecified: Secondary | ICD-10-CM

## 2010-12-06 DIAGNOSIS — R5381 Other malaise: Secondary | ICD-10-CM

## 2010-12-06 DIAGNOSIS — D649 Anemia, unspecified: Secondary | ICD-10-CM

## 2010-12-06 DIAGNOSIS — R5383 Other fatigue: Secondary | ICD-10-CM

## 2010-12-06 LAB — HEPATIC FUNCTION PANEL
ALT: 40 U/L (ref 0–53)
AST: 36 U/L (ref 0–37)
Albumin: 4.3 g/dL (ref 3.5–5.2)
Alkaline Phosphatase: 71 U/L (ref 39–117)
Bilirubin, Direct: 0.3 mg/dL (ref 0.0–0.3)
Total Bilirubin: 2.2 mg/dL — ABNORMAL HIGH (ref 0.3–1.2)
Total Protein: 8 g/dL (ref 6.0–8.3)

## 2010-12-06 LAB — LIPID PANEL
Cholesterol: 121 mg/dL (ref 0–200)
HDL: 53.6 mg/dL (ref >39.00)
LDL Cholesterol: 56 mg/dL (ref 0–99)
Total CHOL/HDL Ratio: 2
Triglycerides: 56 mg/dL (ref 0.0–149.0)
VLDL: 11.2 mg/dL (ref 0.0–40.0)

## 2010-12-06 LAB — CBC WITH DIFFERENTIAL/PLATELET
Basophils Absolute: 0.1 10*3/uL (ref 0.0–0.1)
Basophils Relative: 0.8 % (ref 0.0–3.0)
Eosinophils Absolute: 0.2 10*3/uL (ref 0.0–0.7)
Eosinophils Relative: 3.5 % (ref 0.0–5.0)
HCT: 42.8 % (ref 39.0–52.0)
Hemoglobin: 14.4 g/dL (ref 13.0–17.0)
Lymphocytes Relative: 26.4 % (ref 12.0–46.0)
Lymphs Abs: 1.6 10*3/uL (ref 0.7–4.0)
MCHC: 33.6 g/dL (ref 30.0–36.0)
MCV: 91.4 fl (ref 78.0–100.0)
Monocytes Absolute: 0.5 10*3/uL (ref 0.1–1.0)
Monocytes Relative: 8.2 % (ref 3.0–12.0)
Neutro Abs: 3.8 10*3/uL (ref 1.4–7.7)
Neutrophils Relative %: 61.1 % (ref 43.0–77.0)
Platelets: 200 10*3/uL (ref 150.0–400.0)
RBC: 4.68 Mil/uL (ref 4.22–5.81)
RDW: 14.2 % (ref 11.5–14.6)
WBC: 6.1 10*3/uL (ref 4.5–10.5)

## 2010-12-06 LAB — BASIC METABOLIC PANEL
BUN: 11 mg/dL (ref 6–23)
CO2: 28 mEq/L (ref 19–32)
Calcium: 9 mg/dL (ref 8.4–10.5)
Chloride: 106 mEq/L (ref 96–112)
Creatinine, Ser: 0.9 mg/dL (ref 0.4–1.5)
GFR: 92.72 mL/min (ref >60.00)
Glucose, Bld: 146 mg/dL — ABNORMAL HIGH (ref 70–99)
Potassium: 4.6 mEq/L (ref 3.5–5.1)
Sodium: 142 mEq/L (ref 135–145)

## 2010-12-06 LAB — TSH: TSH: 1.66 u[IU]/mL (ref 0.35–5.50)

## 2010-12-06 MED ORDER — OXYCODONE-ACETAMINOPHEN 5-325 MG PO TABS: 1.0000 | ORAL_TABLET | Freq: Two times a day (BID) | ORAL | Status: DC

## 2010-12-06 NOTE — Telephone Encounter (Signed)
Ok - printed/signed - thx

## 2010-12-06 NOTE — Telephone Encounter (Signed)
Notified pt rx ready for pick-up.Marland KitchenMarland Kitchen8/17/1228:42am/LMB

## 2010-12-25 ENCOUNTER — Ambulatory Visit (AMBULATORY_SURGERY_CENTER): Payer: Medicare Other | Admitting: *Deleted

## 2010-12-25 VITALS — Ht 72.0 in | Wt 218.0 lb

## 2010-12-25 DIAGNOSIS — Z1211 Encounter for screening for malignant neoplasm of colon: Secondary | ICD-10-CM

## 2010-12-25 MED ORDER — PEG-KCL-NACL-NASULF-NA ASC-C 100 G PO SOLR: ORAL | Status: DC

## 2010-12-25 MED ORDER — SUPREP BOWEL PREP KIT 17.5-3.13-1.6 GM/177ML PO SOLN: 1.0000 | ORAL | Status: DC

## 2011-01-06 ENCOUNTER — Other Ambulatory Visit: Payer: Self-pay | Admitting: *Deleted

## 2011-01-06 NOTE — Telephone Encounter (Signed)
Pt is requesting refill of Percocet-PCP is Dr. Felicity Coyer and pt is aware that she is out of office-please advise in her absence-last written 12/06/2010 #60 with 0 refills.

## 2011-01-07 MED ORDER — OXYCODONE-ACETAMINOPHEN 5-325 MG PO TABS: 1.0000 | ORAL_TABLET | Freq: Two times a day (BID) | ORAL | Status: DC | PRN

## 2011-01-07 NOTE — Telephone Encounter (Signed)
Notified pt rx ready for pick-up.Marland KitchenMarland Kitchen9/18/12@1 :03pm/LMB

## 2011-01-07 NOTE — Telephone Encounter (Signed)
Pt has called several times requesting renewal on his oxycodone. Pt states he is out and really is needing med due to back pain. Want to pick rx up today having colonoscopy done tomorrow, and will not be able to get rx later on in the week.Marland KitchenMarland Kitchen9/18/12@10 :40am/LMB

## 2011-01-07 NOTE — Telephone Encounter (Signed)
Done hardcopy to dahlia/LIM B  

## 2011-01-08 ENCOUNTER — Ambulatory Visit (AMBULATORY_SURGERY_CENTER): Payer: Medicare Other | Admitting: Gastroenterology

## 2011-01-08 ENCOUNTER — Encounter: Payer: Self-pay | Admitting: Gastroenterology

## 2011-01-08 VITALS — BP 155/69 | HR 64 | Temp 97.5°F | Resp 16 | Ht 72.0 in | Wt 218.0 lb

## 2011-01-08 DIAGNOSIS — Z1211 Encounter for screening for malignant neoplasm of colon: Secondary | ICD-10-CM | POA: Insufficient documentation

## 2011-01-08 DIAGNOSIS — D126 Benign neoplasm of colon, unspecified: Secondary | ICD-10-CM

## 2011-01-08 MED ORDER — SODIUM CHLORIDE 0.9 % IV SOLN: 500.0000 mL | INTRAVENOUS | Status: DC

## 2011-01-08 NOTE — Progress Notes (Signed)
Per Dr. Jarold Motto the pt's abdomen was distended pre-procedure.  Pt's abdomen remained distended.  Pt's son assisted the pt dressing.  No complaints on discharge. MAW

## 2011-01-08 NOTE — Patient Instructions (Signed)
See the picture page for your findings from your exam today.  Follow the green and blue discharge instruction sheets the rest of the day.  Resume your prior medications today. Please call if any questions or concerns.  

## 2011-01-09 ENCOUNTER — Telehealth: Payer: Self-pay | Admitting: *Deleted

## 2011-01-09 NOTE — Telephone Encounter (Signed)
Follow up Call- Patient questions:  Do you have a fever, pain , or abdominal swelling? no Pain Score  0 *  Have you tolerated food without any problems? yes  Have you been able to return to your normal activities? yes  Do you have any questions about your discharge instructions: Diet   no Medications  no Follow up visit  no  Do you have questions or concerns about your Care? no  Actions: * If pain score is 4 or above: No action needed, pain <4. Pt states he has back pain this morning, which he has on a chronic basis.  No c/o abdominal pain

## 2011-01-14 ENCOUNTER — Encounter: Payer: Self-pay | Admitting: Gastroenterology

## 2011-01-17 ENCOUNTER — Other Ambulatory Visit: Payer: Self-pay | Admitting: Internal Medicine

## 2011-01-18 ENCOUNTER — Other Ambulatory Visit: Payer: Self-pay | Admitting: Internal Medicine

## 2011-01-20 ENCOUNTER — Other Ambulatory Visit: Payer: Self-pay | Admitting: *Deleted

## 2011-01-20 MED ORDER — FUROSEMIDE 40 MG PO TABS: 40.0000 mg | ORAL_TABLET | Freq: Every day | ORAL | Status: DC

## 2011-01-23 ENCOUNTER — Other Ambulatory Visit: Payer: Self-pay | Admitting: Internal Medicine

## 2011-02-05 ENCOUNTER — Other Ambulatory Visit: Payer: Self-pay | Admitting: *Deleted

## 2011-02-05 MED ORDER — OXYCODONE-ACETAMINOPHEN 5-325 MG PO TABS: 1.0000 | ORAL_TABLET | Freq: Two times a day (BID) | ORAL | Status: DC | PRN

## 2011-02-05 NOTE — Telephone Encounter (Signed)
Left msg on vm needing refill on his oxycodone....02/05/11@11 :09pm/LMB

## 2011-02-05 NOTE — Telephone Encounter (Signed)
Pt informed, Rx in cabinet for pt pick up  

## 2011-02-05 NOTE — Telephone Encounter (Signed)
Done hardcopy to dahlia/LIM B  

## 2011-02-20 ENCOUNTER — Other Ambulatory Visit: Payer: Self-pay | Admitting: *Deleted

## 2011-02-20 MED ORDER — POTASSIUM CHLORIDE CRYS ER 20 MEQ PO TBCR: 20.0000 meq | EXTENDED_RELEASE_TABLET | Freq: Two times a day (BID) | ORAL | Status: DC

## 2011-03-05 ENCOUNTER — Other Ambulatory Visit: Payer: Self-pay

## 2011-03-05 MED ORDER — OXYCODONE-ACETAMINOPHEN 5-325 MG PO TABS: 1.0000 | ORAL_TABLET | Freq: Two times a day (BID) | ORAL | Status: DC | PRN

## 2011-03-05 NOTE — Telephone Encounter (Signed)
Pt called requesting refill of pain medication to fill today. Pt states that leg and knee pain has increased and he would like MD to consider increasing dosage. Please advise

## 2011-03-05 NOTE — Telephone Encounter (Signed)
Ok to refill as requested but i am uncomfortable increasing dose at this time. Thanks

## 2011-03-05 NOTE — Telephone Encounter (Signed)
Pt advised and expressed understanding. Rx in cabinet for pt pick up

## 2011-03-11 ENCOUNTER — Encounter: Payer: Self-pay | Admitting: *Deleted

## 2011-03-11 ENCOUNTER — Other Ambulatory Visit: Payer: Self-pay | Admitting: *Deleted

## 2011-03-11 MED ORDER — METOPROLOL TARTRATE 25 MG PO TABS: 12.5000 mg | ORAL_TABLET | Freq: Two times a day (BID) | ORAL | Status: DC

## 2011-03-11 NOTE — Telephone Encounter (Signed)
This encounter was created in error - please disregard.

## 2011-03-12 ENCOUNTER — Other Ambulatory Visit: Payer: Self-pay

## 2011-04-03 ENCOUNTER — Other Ambulatory Visit: Payer: Self-pay | Admitting: *Deleted

## 2011-04-03 MED ORDER — OXYCODONE-ACETAMINOPHEN 5-325 MG PO TABS: 1.0000 | ORAL_TABLET | Freq: Two times a day (BID) | ORAL | Status: DC | PRN

## 2011-04-03 NOTE — Telephone Encounter (Signed)
Left msg on vm needing renewal on pain medication....04/03/11@10 :55am/LMB

## 2011-04-03 NOTE — Telephone Encounter (Signed)
Notified pt rx ready for pick-up...04/03/11@2 :01pm/LMB

## 2011-05-02 ENCOUNTER — Other Ambulatory Visit: Payer: Self-pay

## 2011-05-02 MED ORDER — OXYCODONE-ACETAMINOPHEN 5-325 MG PO TABS: 1.0000 | ORAL_TABLET | Freq: Two times a day (BID) | ORAL | Status: DC | PRN

## 2011-05-02 NOTE — Telephone Encounter (Signed)
Pt informed, Rx in cabinet for pt pick up  

## 2011-05-05 ENCOUNTER — Other Ambulatory Visit: Payer: Self-pay | Admitting: Cardiology

## 2011-05-05 MED ORDER — METOPROLOL TARTRATE 25 MG PO TABS: 12.5000 mg | ORAL_TABLET | Freq: Two times a day (BID) | ORAL | Status: DC

## 2011-05-15 ENCOUNTER — Other Ambulatory Visit: Payer: Self-pay | Admitting: Cardiology

## 2011-05-15 MED ORDER — POTASSIUM CHLORIDE CRYS ER 20 MEQ PO TBCR: 20.0000 meq | EXTENDED_RELEASE_TABLET | Freq: Two times a day (BID) | ORAL | Status: DC

## 2011-05-15 MED ORDER — FUROSEMIDE 40 MG PO TABS: 40.0000 mg | ORAL_TABLET | Freq: Every day | ORAL | Status: DC

## 2011-06-02 ENCOUNTER — Other Ambulatory Visit: Payer: Self-pay

## 2011-06-02 MED ORDER — OXYCODONE-ACETAMINOPHEN 5-325 MG PO TABS: 1.0000 | ORAL_TABLET | Freq: Two times a day (BID) | ORAL | Status: DC | PRN

## 2011-06-02 NOTE — Telephone Encounter (Signed)
Pt informed, Rx in cabinet for pt pick up  

## 2011-06-10 ENCOUNTER — Other Ambulatory Visit: Payer: Self-pay | Admitting: Cardiology

## 2011-06-17 ENCOUNTER — Other Ambulatory Visit: Payer: Self-pay | Admitting: Cardiology

## 2011-06-26 ENCOUNTER — Other Ambulatory Visit: Payer: Self-pay

## 2011-06-26 MED ORDER — OXYCODONE-ACETAMINOPHEN 5-325 MG PO TABS: 1.0000 | ORAL_TABLET | Freq: Two times a day (BID) | ORAL | Status: DC | PRN

## 2011-06-26 NOTE — Telephone Encounter (Signed)
Pt informed, Rx in cabinet for pt pick up  

## 2011-07-21 ENCOUNTER — Other Ambulatory Visit: Payer: Self-pay | Admitting: Internal Medicine

## 2011-07-21 ENCOUNTER — Telehealth: Payer: Self-pay | Admitting: *Deleted

## 2011-07-21 MED ORDER — NIACIN ER (ANTIHYPERLIPIDEMIC) 500 MG PO TBCR: 1000.0000 mg | EXTENDED_RELEASE_TABLET | Freq: Every day | ORAL | Status: DC

## 2011-07-21 NOTE — Telephone Encounter (Signed)
Pt needs new RX for niaspan 1000mg  sent to [facility] [address]

## 2011-07-22 ENCOUNTER — Other Ambulatory Visit: Payer: Self-pay

## 2011-07-22 MED ORDER — OXYCODONE-ACETAMINOPHEN 5-325 MG PO TABS: 1.0000 | ORAL_TABLET | Freq: Two times a day (BID) | ORAL | Status: DC | PRN

## 2011-07-23 NOTE — Telephone Encounter (Signed)
Pt informed, Rx in cabinet for pt pick up  

## 2011-08-11 ENCOUNTER — Telehealth: Payer: Self-pay

## 2011-08-11 NOTE — Telephone Encounter (Signed)
[facility] Triage Call Report Triage Record Num: [id number] Operator: April Finney Patient Name: [name] Call Date & Time: 08/10/2011 8:12:23AM Patient Phone: [phone] PCP: Rene Paci Patient Gender: Male PCP Fax : [phone] Patient DOB: 19-Apr-1941 Practice Name: [facility] Reason for Call: Caller: Saleem/Patient; PCP: Rene Paci; CB#: [phone]; Call regarding question about cold medication. Reviewed per [employer]. Protocol(s) Used: Information Only Calls, No Triage Recommended Outcome per Protocol: Provide Information or Advice Only Reason for Outcome: Health Information question and no triage Care Advice: ~ 08/10/2011 8:39:53AM Page 1 of 1 [id number]

## 2011-08-11 NOTE — Telephone Encounter (Signed)
Noted thanks °

## 2011-08-15 ENCOUNTER — Ambulatory Visit: Payer: Medicare Other | Admitting: Cardiology

## 2011-08-18 ENCOUNTER — Other Ambulatory Visit: Payer: Self-pay

## 2011-08-18 MED ORDER — OXYCODONE-ACETAMINOPHEN 5-325 MG PO TABS: 1.0000 | ORAL_TABLET | Freq: Two times a day (BID) | ORAL | Status: DC | PRN

## 2011-08-18 NOTE — Telephone Encounter (Signed)
Pt informed, Rx in cabinet for pt pick up  

## 2011-08-25 ENCOUNTER — Telehealth: Payer: Self-pay

## 2011-08-25 MED ORDER — OXYCODONE-ACETAMINOPHEN 5-325 MG PO TABS: 1.0000 | ORAL_TABLET | Freq: Two times a day (BID) | ORAL | Status: DC | PRN

## 2011-08-25 NOTE — Telephone Encounter (Signed)
Pt called stating that on his way to the bathroom this morning to get water to take his pain medication, he tripped and all but 4 of his pills fell into the toilet. Pt is now going to be out of medication after tomorrow, please advise.

## 2011-08-25 NOTE — Telephone Encounter (Signed)
[place] control subst registry reviewed - no other providers/rx noted. As pt has never shown abusive behavior in regards to narcotics, ok for early refill.

## 2011-08-25 NOTE — Telephone Encounter (Signed)
Pt informed, Rx in cabinet for pt pick up  

## 2011-09-02 ENCOUNTER — Other Ambulatory Visit: Payer: Self-pay | Admitting: Cardiology

## 2011-09-02 DIAGNOSIS — I6529 Occlusion and stenosis of unspecified carotid artery: Secondary | ICD-10-CM

## 2011-09-04 ENCOUNTER — Encounter (INDEPENDENT_AMBULATORY_CARE_PROVIDER_SITE_OTHER): Payer: Medicare Other

## 2011-09-04 ENCOUNTER — Encounter: Payer: Self-pay | Admitting: Cardiology

## 2011-09-04 ENCOUNTER — Ambulatory Visit (INDEPENDENT_AMBULATORY_CARE_PROVIDER_SITE_OTHER): Payer: Medicare Other | Admitting: Cardiology

## 2011-09-04 VITALS — BP 154/70 | HR 67 | Ht 72.0 in | Wt 224.0 lb

## 2011-09-04 DIAGNOSIS — E785 Hyperlipidemia, unspecified: Secondary | ICD-10-CM

## 2011-09-04 DIAGNOSIS — I714 Abdominal aortic aneurysm, without rupture: Secondary | ICD-10-CM

## 2011-09-04 DIAGNOSIS — I6529 Occlusion and stenosis of unspecified carotid artery: Secondary | ICD-10-CM | POA: Diagnosis not present

## 2011-09-04 DIAGNOSIS — I2510 Atherosclerotic heart disease of native coronary artery without angina pectoris: Secondary | ICD-10-CM

## 2011-09-04 DIAGNOSIS — I10 Essential (primary) hypertension: Secondary | ICD-10-CM

## 2011-09-04 NOTE — Assessment & Plan Note (Signed)
Continue aspirin and statin. Await results of followup carotid Dopplers performed today. 

## 2011-09-04 NOTE — Assessment & Plan Note (Signed)
Blood pressure mildly elevated. He also has mild pedal edema  And is not taking his diuretic routinely. I have asked him to take his Lasix on a daily basis which should help both his edema and his blood pressure. Check potassium and renal function in one week.

## 2011-09-04 NOTE — Patient Instructions (Signed)
Your physician wants you to follow-up in: ONE YEAR WITH DR Shelda Pal will receive a reminder letter in the mail two months in advance. If you don't receive a letter, please call our office to schedule the follow-up appointment.  Your physician has requested that you have an abdominal aorta duplex. During this test, an ultrasound is used to evaluate the aorta. Allow 30 minutes for this exam. Do not eat after midnight the day before and avoid carbonated beverages   Your physician recommends that you return for lab work WITH DOPPLERS  TAKE FUROSEMIDE DAILY

## 2011-09-04 NOTE — Assessment & Plan Note (Signed)
Continue aspirin and statin. 

## 2011-09-04 NOTE — Assessment & Plan Note (Signed)
Schedule followup abdominal ultrasound. 

## 2011-09-04 NOTE — Progress Notes (Signed)
HPI: Pleasant male for fu of CAD. Patient had previous NSTEMI followed by CABG on November 03, 2008. He had left internal mammary artery to distal left anterior descending, saphenous vein graft to first circumflex marginal branch with Y graft sequential to left posterolateral branch, saphenous vein graft to distal right coronary artery. echocardiogram in may of 2012 showed an ejection fraction of 55-60% and mild left atrial enlargement. Last carotid Dopplers in May of 2012 revealed a 40-59% left stenosis and 0-39% right stenosis. FU recommended in one year.  Abdominal CT in April of 2012 showed an infrarenal abdominal aortic aneurysm measuring 3.1 cm. 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 I last saw him in April of 2012. Since then, the patient has dyspnea with more extreme activities but not with routine activities. It is relieved with rest. It is not associated with chest pain. There is no orthopnea, PND. There is no syncope or palpitations. There is no exertional chest pain. Patient does have mild pedal edema.    Current Outpatient Prescriptions  Medication Sig Dispense Refill  . albuterol (PROVENTIL,VENTOLIN) 90 MCG/ACT inhaler Inhale 2 puffs into the lungs as needed.        [name] amLODipine (NORVASC) 10 MG tablet TAKE 1 TABLET (10 MG TOTAL) BY MOUTH DAILY.  30 tablet  6  . aspirin 81 MG tablet Take 81 mg by mouth daily.        [name] atorvastatin (LIPITOR) 40 MG tablet TAKE ONE TABLET BY MOUTH DAILY.  30 tablet  6  . buPROPion (WELLBUTRIN XL) 150 MG 24 hr tablet TAKE 1 TABLET EVERY DAY  30 tablet  5  . Chlorpheniramine-DM (CORICIDIN COUGH/COLD) 4-30 MG TABS Take 1 tablet by mouth daily.        [name] docusate sodium (STOOL SOFTENER) 100 MG capsule Take 100 mg by mouth 2 (two) times daily.        . fish oil-omega-3 fatty acids 1000 MG capsule Take 1 g by mouth 2 (two) times daily after a meal.        . furosemide (LASIX) 40 MG tablet Take 1 tablet (40 mg  total) by mouth daily.  30 tablet  10  . guaiFENesin (MUCINEX) 600 MG 12 hr tablet Take 1,200 mg by mouth 2 (two) times daily.        . metoprolol tartrate (LOPRESSOR) 25 MG tablet Take 0.5 tablets (12.5 mg total) by mouth 2 (two) times daily.  30 tablet  6  . NASONEX 50 MCG/ACT nasal spray USE 1 SPRAY EACH NOSTRIL EVERY MORNING  17 g  3  . niacin (NIASPAN) 500 MG CR tablet Take 2 tablets (1,000 mg total) by mouth daily.  60 tablet  12  . oxyCODONE-acetaminophen (PERCOCET) 5-325 MG per tablet Take 1 tablet by mouth 2 (two) times daily as needed.  60 tablet  0  . potassium chloride SA (KLOR-CON M20) 20 MEQ tablet Take 1 tablet (20 mEq total) by mouth 2 (two) times daily.  60 tablet  10  . Sennosides 25 MG TABS Take 1 tablet by mouth daily.        . sildenafil (VIAGRA) 50 MG tablet Take 1 tablet (50 mg total) by mouth as needed for erectile dysfunction.  6 tablet  1     Past Medical History  Diagnosis Date  . 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 COPD (chronic obstructive pulmonary disease)   .  Cerebrovascular disease   . AAA (abdominal aortic aneurysm)   . 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 Hyperlipidemia   . HTN (hypertension)   . CONGENITAL UNSPEC REDUCTION DEFORMITY LOWER LIMB   . TOBACCO USE, QUIT     Past Surgical History  Procedure Date  . Coronary artery bypass graft 11/03/2008    Barry Dienes - x4: left internal mammary artery to the distal left anterior descending, saphemous vein graft to the first circumflex marginal branch with a Y graft sequentiallly to a left posterolateral branch, saphenous vein graft to the distal right coronary artery    History   Social History  . Marital Status: Widowed    Spouse Name: N/A    Number of Children: N/A  . Years of Education: N/A   Occupational History  . Not on file.   Social History Main Topics  . Smoking status: Former Games developer  . Smokeless tobacco: Never Used  . Alcohol Use: No     History of  heavy alcohol use per pt. Quit many years ago1/1/ 2005  . Drug Use: No  . Sexually Active: Not on file   Other Topics Concern  . Not on file   Social History Narrative   lives alone here in [place].  He has 1 son, who lives in the [place], and he has  an uncle, who lives nearby as well.     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 Remaining systems are negative.  Physical Exam: Well-developed well-nourished in no acute distress.  Skin is warm and dry.  HEENT is normal.  Neck is supple.  Chest is clear to auscultation with normal expansion.  Cardiovascular exam is regular rate and rhythm. 2/6 systolic murmur left sternal border. S2 is not diminished. Abdominal exam nontender or distended. No masses palpated. Extremities show 1+ edema. neuro grossly intact  ECG sinus rhythm at a rate of 67. First degree AV block. No ST changes.

## 2011-09-04 NOTE — Assessment & Plan Note (Signed)
Continue statin. Check lipids and liver. 

## 2011-09-08 ENCOUNTER — Telehealth: Payer: Self-pay | Admitting: Cardiology

## 2011-09-22 ENCOUNTER — Telehealth: Payer: Self-pay

## 2011-09-22 ENCOUNTER — Other Ambulatory Visit: Payer: Self-pay

## 2011-09-22 MED ORDER — OXYCODONE-ACETAMINOPHEN 5-325 MG PO TABS: 1.0000 | ORAL_TABLET | Freq: Two times a day (BID) | ORAL | Status: DC | PRN

## 2011-09-22 NOTE — Telephone Encounter (Signed)
Please advise on Percocet refill for this Dr Felicity Coyer pt, thanks.

## 2011-09-22 NOTE — Telephone Encounter (Signed)
Done hardcopy to robin  

## 2011-09-22 NOTE — Telephone Encounter (Signed)
Called the patient to informed Oxycodone 5/325 prescription requested is ready for pickup at the front desk.  Previous phone note stated faxed in, but did not fax, called patient to pickup hardcopy at front desk.

## 2011-09-22 NOTE — Telephone Encounter (Signed)
Faxed hardcopy to pharmacy. 

## 2011-10-01 ENCOUNTER — Encounter: Payer: Self-pay | Admitting: *Deleted

## 2011-10-01 ENCOUNTER — Encounter (INDEPENDENT_AMBULATORY_CARE_PROVIDER_SITE_OTHER): Payer: Medicare Other

## 2011-10-01 ENCOUNTER — Ambulatory Visit (INDEPENDENT_AMBULATORY_CARE_PROVIDER_SITE_OTHER): Payer: Medicare Other | Admitting: *Deleted

## 2011-10-01 DIAGNOSIS — I10 Essential (primary) hypertension: Secondary | ICD-10-CM

## 2011-10-01 DIAGNOSIS — E785 Hyperlipidemia, unspecified: Secondary | ICD-10-CM

## 2011-10-01 DIAGNOSIS — I714 Abdominal aortic aneurysm, without rupture: Secondary | ICD-10-CM

## 2011-10-01 LAB — BASIC METABOLIC PANEL
BUN: 12 mg/dL (ref 6–23)
CO2: 27 mEq/L (ref 19–32)
Calcium: 9 mg/dL (ref 8.4–10.5)
Chloride: 101 mEq/L (ref 96–112)
Creatinine, Ser: 0.9 mg/dL (ref 0.4–1.5)
GFR: 86.72 mL/min (ref >60.00)
Glucose, Bld: 111 mg/dL — ABNORMAL HIGH (ref 70–99)
Potassium: 3.8 mEq/L (ref 3.5–5.1)
Sodium: 137 mEq/L (ref 135–145)

## 2011-10-01 LAB — HEPATIC FUNCTION PANEL
ALT: 29 U/L (ref 0–53)
AST: 31 U/L (ref 0–37)
Albumin: 4 g/dL (ref 3.5–5.2)
Alkaline Phosphatase: 76 U/L (ref 39–117)
Bilirubin, Direct: 0.3 mg/dL (ref 0.0–0.3)
Total Bilirubin: 2.4 mg/dL — ABNORMAL HIGH (ref 0.3–1.2)
Total Protein: 7.6 g/dL (ref 6.0–8.3)

## 2011-10-01 LAB — LIPID PANEL
Cholesterol: 128 mg/dL (ref 0–200)
HDL: 46.3 mg/dL (ref >39.00)
LDL Cholesterol: 62 mg/dL (ref 0–99)
Total CHOL/HDL Ratio: 3
Triglycerides: 100 mg/dL (ref 0.0–149.0)
VLDL: 20 mg/dL (ref 0.0–40.0)

## 2011-10-07 ENCOUNTER — Telehealth: Payer: Self-pay | Admitting: Cardiology

## 2011-10-07 DIAGNOSIS — I729 Aneurysm of unspecified site: Secondary | ICD-10-CM

## 2011-10-07 NOTE — Telephone Encounter (Signed)
Spoke with pt, he is aware he will need a CTA to size his aneurysm. Most recent bmp good. CTA scheduled for Friday 10-10-11 at 2:30pm. Pt aware to be NPO 4 hours prior to testing.

## 2011-10-07 NOTE — Telephone Encounter (Signed)
New msg Pt wants to know test results from last week. He hasn't heard anything

## 2011-10-10 ENCOUNTER — Ambulatory Visit (INDEPENDENT_AMBULATORY_CARE_PROVIDER_SITE_OTHER)
Admission: RE | Admit: 2011-10-10 | Discharge: 2011-10-10 | Disposition: A | Discharge status: Home / Self Care | Payer: Medicare Other | Source: Ambulatory Visit | Attending: Cardiology | Admitting: Cardiology

## 2011-10-10 DIAGNOSIS — R599 Enlarged lymph nodes, unspecified: Secondary | ICD-10-CM | POA: Diagnosis not present

## 2011-10-10 DIAGNOSIS — I714 Abdominal aortic aneurysm, without rupture: Secondary | ICD-10-CM | POA: Diagnosis not present

## 2011-10-10 DIAGNOSIS — K8020 Calculus of gallbladder without cholecystitis without obstruction: Secondary | ICD-10-CM | POA: Diagnosis not present

## 2011-10-10 DIAGNOSIS — I729 Aneurysm of unspecified site: Secondary | ICD-10-CM

## 2011-10-10 MED ORDER — IOHEXOL 300 MG/ML  SOLN
100.0000 mL | Freq: Once | INTRAMUSCULAR | Status: AC | PRN
  Administered 2011-10-10: 100 mL via INTRAVENOUS

## 2011-10-22 ENCOUNTER — Ambulatory Visit (INDEPENDENT_AMBULATORY_CARE_PROVIDER_SITE_OTHER): Payer: Medicare Other | Admitting: Internal Medicine

## 2011-10-22 ENCOUNTER — Encounter: Payer: Self-pay | Admitting: Internal Medicine

## 2011-10-22 VITALS — BP 136/68 | HR 69 | Temp 97.5°F | Wt 223.1 lb

## 2011-10-22 DIAGNOSIS — E785 Hyperlipidemia, unspecified: Secondary | ICD-10-CM | POA: Diagnosis not present

## 2011-10-22 DIAGNOSIS — J449 Chronic obstructive pulmonary disease, unspecified: Secondary | ICD-10-CM | POA: Diagnosis not present

## 2011-10-22 DIAGNOSIS — M549 Dorsalgia, unspecified: Secondary | ICD-10-CM | POA: Diagnosis not present

## 2011-10-22 DIAGNOSIS — I10 Essential (primary) hypertension: Secondary | ICD-10-CM

## 2011-10-22 MED ORDER — OXYCODONE-ACETAMINOPHEN 5-325 MG PO TABS: 1.0000 | ORAL_TABLET | Freq: Four times a day (QID) | ORAL | Status: DC | PRN

## 2011-10-22 MED ORDER — ALBUTEROL SULFATE HFA 108 (90 BASE) MCG/ACT IN AERS: 2.0000 | INHALATION_SPRAY | Freq: Four times a day (QID) | RESPIRATORY_TRACT | Status: DC | PRN

## 2011-10-22 NOTE — Assessment & Plan Note (Signed)
BP Readings from Last 3 Encounters:  10/22/11 136/68  09/04/11 154/70  01/08/11 155/69   The current medical regimen is effective;  continue present plan and medications.

## 2011-10-22 NOTE — Assessment & Plan Note (Signed)
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

## 2011-10-22 NOTE — Assessment & Plan Note (Signed)
Quit smoking 2010 Uses Albuterol prn - neb too costly so uses MDI No flares The current medical regimen is effective;  continue present plan and medications.

## 2011-10-22 NOTE — Patient Instructions (Signed)
It was good to see you today. We have reviewed your prior records including labs and tests today Increase number of Percocet per month as dicussed Your prescription(s) have been given to you to submit to your pharmacy. Please take as directed and contact our office if you believe you are having problem(s) with the medication(s). Other Medications reviewed, no changes at this time.

## 2011-10-22 NOTE — Progress Notes (Signed)
Subjective:    Patient ID: [name], male    DOB: [birth year], 71 y.o.   MRN: [id number]  Back Pain Pertinent negatives include no chest pain or headaches.   also reviewed chronic medical issues:  HTN - reports compliance with ongoing medical treatment and no changes in medication dose or frequency. denies adverse side effects related to current therapy. no headache or vision change or weakness, no edema   dyslipidemia - on statin + niacin -reports compliance with ongoing medical treatment and no changes in medication dose or frequency.  denies adverse side effects related to current therapy. no muscle aches    CAD - s/p CABG 10/2008 - follows with cards for same -  reports compliance with ongoing medical treatment and no changes in medication dose or frequency.  denies adverse side effects related to current therapy. no chest pain or anginal symptoms   BLE pain - relates symptoms to his shortened LLE from ?polio or CP as a kid -  pain was improved after taking percocet s/p CABG '10 - "only thing that ever worked" tried [employer] from12/2010 with no relief  hydrocodone caused rash - but changed to percocet and tolerating well pursued left lift shoes and PT without change in symptoms    Past Medical History  Diagnosis Date  . 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 COPD (chronic obstructive pulmonary disease)   . Cerebrovascular disease   . AAA (abdominal aortic aneurysm)   . 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 Hyperlipidemia   . HTN (hypertension)   . CONGENITAL UNSPEC REDUCTION DEFORMITY LOWER LIMB   . TOBACCO USE, QUIT      Review of Systems  Constitutional: Negative for unexpected weight change.  Respiratory: Negative for cough and shortness of breath.   Cardiovascular: Negative for chest pain and leg swelling.  Musculoskeletal: Positive for back pain. Negative for joint swelling and gait problem.  Neurological: Negative for headaches.       Objective:   Physical Exam  BP 136/68  Pulse 69  Temp 97.5 F (36.4 C) (Oral)  Wt 223 lb 1.3 oz (101.188 kg)  SpO2 97% Wt Readings from Last 3 Encounters:  10/22/11 223 lb 1.3 oz (101.188 kg)  09/04/11 224 lb (101.606 kg)  01/08/11 218 lb (98.884 kg)   Constitutional:  He appears well-developed and well-nourished. No distress.  Neck: Normal range of motion. Neck supple. No JVD present. No thyromegaly present.  no bruits Cardiovascular: Normal rate, regular rhythm and normal heart sounds.  No murmur heard. no BLE edema Pulmonary/Chest: Effort normal and breath sounds normal. No respiratory distress. no wheezes. MSkel: Back: full range of motion of thoracic and lumbar spine. Non tender to palpation. Negative straight leg raise. DTR's are symmetrically intact. Sensation intact in all dermatomes of the lower extremities. Full strength to manual muscle testing. patient is able to heel toe walk without difficulty and ambulates with antalgic gait.  Neurological: he is alert and oriented to person, place, and time. No cranial nerve deficit. Coordination normal.  Psychiatric: he has a normal mood and affect. behavior is normal. Judgment and thought content normal.   Lab Results  Component Value Date   WBC 6.1 12/06/2010   HGB 14.4 12/06/2010   HCT 42.8 12/06/2010   PLT 200.0 12/06/2010   GLUCOSE 111* 10/01/2011   CHOL 128 10/01/2011   TRIG 100.0 10/01/2011   HDL 46.30 10/01/2011   LDLCALC 62 10/01/2011  ALT 29 10/01/2011   AST 31 10/01/2011   NA 137 10/01/2011   K 3.8 10/01/2011   CL 101 10/01/2011   CREATININE 0.9 10/01/2011   BUN 12 10/01/2011   CO2 27 10/01/2011   TSH 1.66 12/06/2010   INR 1.5 11/03/2008   HGBA1C  Value: 6.0 (NOTE) The ADA recommends the following therapeutic goal for glycemic control related to Hgb A1c measurement: Goal of therapy: <6.5 Hgb A1c  Reference: American Diabetes Association: Clinical Practice Recommendations 2010, Diabetes Care, 2010, 33: (Suppl  1). 11/02/2008        Assessment & Plan:  See problem list. Medications and labs reviewed today.

## 2011-10-22 NOTE — Assessment & Plan Note (Signed)
On statin, hx CAD The current medical regimen is effective;  continue present plan and medications.

## 2011-11-02 ENCOUNTER — Other Ambulatory Visit: Payer: Self-pay | Admitting: Cardiology

## 2011-11-24 ENCOUNTER — Other Ambulatory Visit: Payer: Self-pay

## 2011-11-24 MED ORDER — OXYCODONE-ACETAMINOPHEN 5-325 MG PO TABS: 1.0000 | ORAL_TABLET | Freq: Four times a day (QID) | ORAL | Status: DC | PRN

## 2011-11-24 NOTE — Telephone Encounter (Signed)
Notified pt rx ready for pick-up... 11/24/11@10 :00am/LMB

## 2011-12-05 ENCOUNTER — Telehealth: Payer: Self-pay | Admitting: Internal Medicine

## 2011-12-05 ENCOUNTER — Ambulatory Visit (INDEPENDENT_AMBULATORY_CARE_PROVIDER_SITE_OTHER)
Admission: RE | Admit: 2011-12-05 | Discharge: 2011-12-05 | Disposition: A | Discharge status: Home / Self Care | Payer: Medicare Other | Source: Ambulatory Visit | Attending: Internal Medicine | Admitting: Internal Medicine

## 2011-12-05 ENCOUNTER — Encounter: Payer: Self-pay | Admitting: Internal Medicine

## 2011-12-05 ENCOUNTER — Ambulatory Visit (INDEPENDENT_AMBULATORY_CARE_PROVIDER_SITE_OTHER): Payer: Medicare Other | Admitting: Internal Medicine

## 2011-12-05 VITALS — BP 136/68 | HR 68 | Temp 97.1°F

## 2011-12-05 DIAGNOSIS — I2510 Atherosclerotic heart disease of native coronary artery without angina pectoris: Secondary | ICD-10-CM | POA: Diagnosis not present

## 2011-12-05 DIAGNOSIS — M722 Plantar fascial fibromatosis: Secondary | ICD-10-CM

## 2011-12-05 DIAGNOSIS — M19079 Primary osteoarthritis, unspecified ankle and foot: Secondary | ICD-10-CM | POA: Diagnosis not present

## 2011-12-05 NOTE — Patient Instructions (Addendum)
It was good to see you today. Test(s) ordered today. Your results will be called to you after review (48-72hours after test completion). If any changes need to be made, you will be notified at that time. Use ibuprofen at bedtime for next 2-3 nights to help with pain, ice to painful heel and wear good shoe support Plantar Fasciitis (Heel Spur Syndrome) with Rehab The plantar fascia is a fibrous, ligament-like, soft-tissue structure that spans the bottom of the foot. Plantar fasciitis is a condition that causes pain in the foot due to inflammation of the tissue. SYMPTOMS    Pain and tenderness on the underneath side of the foot.   Pain that worsens with standing or walking.  CAUSES   Plantar fasciitis is caused by irritation and injury to the plantar fascia on the underneath side of the foot. Common mechanisms of injury include:  Direct trauma to bottom of the foot.   Damage to a small nerve that runs under the foot where the main fascia attaches to the heel bone.   Stress placed on the plantar fascia due to bone spurs.  RISK INCREASES WITH:    Activities that place stress on the plantar fascia (running, jumping, pivoting, or cutting).   Poor strength and flexibility.   Improperly fitted shoes.   Tight calf muscles.   Flat feet.   Failure to warm-up properly before activity.   Obesity.  PREVENTION  Warm up and stretch properly before activity.   Allow for adequate recovery between workouts.   Maintain physical fitness:   Strength, flexibility, and endurance.   Cardiovascular fitness.   Maintain a health body weight.   Avoid stress on the plantar fascia.   Wear properly fitted shoes, including arch supports for individuals who have flat feet.  PROGNOSIS   If treated properly, then the symptoms of plantar fasciitis usually resolve without surgery. However, occasionally surgery is necessary. RELATED COMPLICATIONS    Recurrent symptoms that may result in a chronic  condition.   Problems of the lower back that are caused by compensating for the injury, such as limping.   Pain or weakness of the foot during push-off following surgery.   Chronic inflammation, scarring, and partial or complete fascia tear, occurring more often from repeated injections.  TREATMENT   Treatment initially involves the use of ice and medication to help reduce pain and inflammation. The use of strengthening and stretching exercises may help reduce pain with activity, especially stretches of the Achilles tendon. These exercises may be performed at home or with a therapist. Your caregiver may recommend that you use heel cups of arch supports to help reduce stress on the plantar fascia. Occasionally, corticosteroid injections are given to reduce inflammation. If symptoms persist for greater than 6 months despite non-surgical (conservative), then surgery may be recommended.   MEDICATION    If pain medication is necessary, then nonsteroidal anti-inflammatory medications, such as aspirin and ibuprofen, or other minor pain relievers, such as acetaminophen, are often recommended.   Do not take pain medication within 7 days before surgery.   Prescription pain relievers may be given if deemed necessary by your caregiver. Use only as directed and only as much as you need.   Corticosteroid injections may be given by your caregiver. These injections should be reserved for the most serious cases, because they may only be given a certain number of times.  HEAT AND COLD  Cold treatment (icing) relieves pain and reduces inflammation. Cold treatment should be applied for 10  to 15 minutes every 2 to 3 hours for inflammation and pain and immediately after any activity that aggravates your symptoms. Use ice packs or massage the area with a piece of ice (ice massage).   Heat treatment may be used prior to performing the stretching and strengthening activities prescribed by your caregiver, physical  therapist, or athletic trainer. Use a heat pack or soak the injury in warm water.  SEEK IMMEDIATE MEDICAL CARE IF:  Treatment seems to offer no benefit, or the condition worsens.   Any medications produce adverse side effects.  EXERCISES RANGE OF MOTION (ROM) AND STRETCHING EXERCISES - Plantar Fasciitis (Heel Spur Syndrome) These exercises may help you when beginning to rehabilitate your injury. Your symptoms may resolve with or without further involvement from your physician, physical therapist or athletic trainer. While completing these exercises, remember:    Restoring tissue flexibility helps normal motion to return to the joints. This allows healthier, less painful movement and activity.   An effective stretch should be held for at least 30 seconds.   A stretch should never be painful. You should only feel a gentle lengthening or release in the stretched tissue.  RANGE OF MOTION - Toe Extension, Flexion  Sit with your right / left leg crossed over your opposite knee.   Grasp your toes and gently pull them back toward the top of your foot. You should feel a stretch on the bottom of your toes and/or foot.   Hold this stretch for __________ seconds.   Now, gently pull your toes toward the bottom of your foot. You should feel a stretch on the top of your toes and or foot.   Hold this stretch for __________ seconds.  Repeat __________ times. Complete this stretch __________ times per day.   RANGE OF MOTION - Ankle Dorsiflexion, Active Assisted  Remove shoes and sit on a chair that is preferably not on a carpeted surface.   Place right / left foot under knee. Extend your opposite leg for support.   Keeping your heel down, slide your right / left foot back toward the chair until you feel a stretch at your ankle or calf. If you do not feel a stretch, slide your bottom forward to the edge of the chair, while still keeping your heel down.   Hold this stretch for __________ seconds.    Repeat __________ times. Complete this stretch __________ times per day.   STRETCH - Gastroc, Standing  Place hands on wall.   Extend right / left leg, keeping the front knee somewhat bent.   Slightly point your toes inward on your back foot.   Keeping your right / left heel on the floor and your knee straight, shift your weight toward the wall, not allowing your back to arch.   You should feel a gentle stretch in the right / left calf. Hold this position for __________ seconds.  Repeat __________ times. Complete this stretch __________ times per day. STRETCH - Soleus, Standing  Place hands on wall.   Extend right / left leg, keeping the other knee somewhat bent.   Slightly point your toes inward on your back foot.   Keep your right / left heel on the floor, bend your back knee, and slightly shift your weight over the back leg so that you feel a gentle stretch deep in your back calf.   Hold this position for __________ seconds.  Repeat __________ times. Complete this stretch __________ times per day. STRETCH - Gastrocsoleus, Standing  Note: This exercise can place a lot of stress on your foot and ankle. Please complete this exercise only if specifically instructed by your caregiver.    Place the ball of your right / left foot on a step, keeping your other foot firmly on the same step.   Hold on to the wall or a rail for balance.   Slowly lift your other foot, allowing your body weight to press your heel down over the edge of the step.   You should feel a stretch in your right / left calf.   Hold this position for __________ seconds.   Repeat this exercise with a slight bend in your right / left knee.  Repeat __________ times. Complete this stretch __________ times per day.   STRENGTHENING EXERCISES - Plantar Fasciitis (Heel Spur Syndrome)  These exercises may help you when beginning to rehabilitate your injury. They may resolve your symptoms with or without further  involvement from your physician, physical therapist or athletic trainer. While completing these exercises, remember:    Muscles can gain both the endurance and the strength needed for everyday activities through controlled exercises.   Complete these exercises as instructed by your physician, physical therapist or athletic trainer. Progress the resistance and repetitions only as guided.  STRENGTH - Towel Curls  Sit in a chair positioned on a non-carpeted surface.   Place your foot on a towel, keeping your heel on the floor.   Pull the towel toward your heel by only curling your toes. Keep your heel on the floor.   If instructed by your physician, physical therapist or athletic trainer, add ____________________ at the end of the towel.  Repeat __________ times. Complete this exercise __________ times per day. STRENGTH - Ankle Inversion  Secure one end of a rubber exercise band/tubing to a fixed object (table, pole). Loop the other end around your foot just before your toes.   Place your fists between your knees. This will focus your strengthening at your ankle.   Slowly, pull your big toe up and in, making sure the band/tubing is positioned to resist the entire motion.   Hold this position for __________ seconds.   Have your muscles resist the band/tubing as it slowly pulls your foot back to the starting position.  Repeat __________ times. Complete this exercises __________ times per day.   Document Released: 04/07/2005 Document Revised: 03/27/2011 Document Reviewed: 07/20/2008 [facility] Patient Information 2012 [place], [place].

## 2011-12-05 NOTE — Telephone Encounter (Signed)
Caller: [name]/Patient; Patient Name: [name]; PCP: Rene Paci; Best Callback Phone Number: [phone].  Patient calling today 12/05/11 regarding right heel.  Onset 12/03/11.  Can bear weight but painful.  No injury to his foot.  Afebrile. Emergent symptoms ruled out by Foot Non Injury guidelines with exception of new onset mild to moderate pain that has not improved with 24 hours of home care.  Appointment scheduled for today at 2 PM with Dr. Felicity Coyer.

## 2011-12-05 NOTE — Progress Notes (Signed)
  Subjective:    Patient ID: [name], male    DOB: [birth year], 71 y.o.   MRN: [id number]  HPI  complains of pain in R heel Onset 3 days ago Pain worse when walking barefeet Denies foreign body  Not relieved with oxy  Past Medical History  Diagnosis Date  . 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 COPD (chronic obstructive pulmonary disease)   . Cerebrovascular disease   . AAA (abdominal aortic aneurysm)   . 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 Hyperlipidemia   . HTN (hypertension)   . CONGENITAL UNSPEC REDUCTION DEFORMITY LOWER LIMB   . TOBACCO USE, QUIT     Review of Systems  Constitutional: Negative for fever and fatigue.  Respiratory: Negative for cough and shortness of breath.   Cardiovascular: Negative for chest pain and palpitations.       Objective:   Physical Exam BP 136/68  Pulse 68  Temp 97.1 F (36.2 C) (Oral)  SpO2 96% Constitutional:  He appears well-developed and well-nourished. No distress.  Neck: Normal range of motion. Neck supple. No JVD present. No thyromegaly present.  Cardiovascular: Normal rate, regular rhythm and normal heart sounds.  No murmur heard. no BLE edema Musculoskeletal: R heel swollen and tender to deep palpation - worse with WB - no laceration or discoloration. Normal range of motion. Patient exhibits no edema.  Neurological: he is alert and oriented to person, place, and time. No cranial nerve deficit. Coordination normal.  Skin: Skin is warm and dry.  No erythema or ulceration.  Psychiatric: he has a normal mood and affect. behavior is normal. Judgment and thought content normal.        Assessment & Plan:   Right plantar fascitis - check xray for heel spur or foreign body  Advised on conservative care

## 2011-12-05 NOTE — Assessment & Plan Note (Signed)
CABG 10/2010 following NSTEMI with cath showing severe diffuse CAD, preserved LVEF On med mgmt with ASA, statin, and beta-blocker  Follows annually with cards - last OV reivewed No anginal symptoms  Advised ok for short term ibuprofen

## 2011-12-18 ENCOUNTER — Other Ambulatory Visit: Payer: Self-pay | Admitting: Internal Medicine

## 2011-12-18 MED ORDER — OXYCODONE-ACETAMINOPHEN 5-325 MG PO TABS: 1.0000 | ORAL_TABLET | Freq: Four times a day (QID) | ORAL | Status: DC | PRN

## 2011-12-18 NOTE — Telephone Encounter (Signed)
Caller: [name]/Patient; Patient Name: [name]; PCP: Rene Paci (Adults only); Best Callback Phone Number: [phone] Pt calling for refill on his Oxycodone rx that he uses for chronic back pain. Pt states he gets this rx monthly. He has to pick up rx and office will be closed on Monday. He states back pain is relieved with medication. Emergent s/s of Back symptoms protocol r/o.

## 2011-12-18 NOTE — Telephone Encounter (Signed)
Pt informed, Rx in cabinet for pt pick up  

## 2011-12-23 ENCOUNTER — Other Ambulatory Visit: Payer: Self-pay | Admitting: Internal Medicine

## 2012-01-07 ENCOUNTER — Other Ambulatory Visit: Payer: Self-pay | Admitting: Cardiology

## 2012-01-13 ENCOUNTER — Other Ambulatory Visit: Payer: Self-pay | Admitting: Cardiology

## 2012-01-15 ENCOUNTER — Telehealth: Payer: Self-pay | Admitting: Internal Medicine

## 2012-01-15 NOTE — Telephone Encounter (Signed)
Caller: Lukus/Patient; Phone: [phone]; Reason for Call: Patient called earlier regarding his Oxycodone, states has not heard from anyone to see if he can get a new prescription.  States he is leaving today to go out of town.  Please call him back.  Thanks

## 2012-01-15 NOTE — Telephone Encounter (Signed)
Caller: [name]/Patient; Phone: [phone]; Reason for Call: Patient calling to get a refill, needs a prescription for Oxycodone.  Please call him back.  Thanks

## 2012-01-15 NOTE — Telephone Encounter (Signed)
ok 

## 2012-01-16 MED ORDER — OXYCODONE-ACETAMINOPHEN 5-325 MG PO TABS: 1.0000 | ORAL_TABLET | Freq: Four times a day (QID) | ORAL | Status: DC | PRN

## 2012-01-16 NOTE — Telephone Encounter (Signed)
Pt informed, Rx in cabinet for pt pick up  

## 2012-01-19 ENCOUNTER — Other Ambulatory Visit: Payer: Self-pay | Admitting: Cardiology

## 2012-01-19 ENCOUNTER — Other Ambulatory Visit: Payer: Self-pay | Admitting: Internal Medicine

## 2012-02-11 ENCOUNTER — Other Ambulatory Visit: Payer: Self-pay

## 2012-02-11 DIAGNOSIS — Z23 Encounter for immunization: Secondary | ICD-10-CM | POA: Diagnosis not present

## 2012-02-11 MED ORDER — OXYCODONE-ACETAMINOPHEN 5-325 MG PO TABS: 1.0000 | ORAL_TABLET | Freq: Four times a day (QID) | ORAL | Status: DC | PRN

## 2012-02-11 NOTE — Telephone Encounter (Signed)
Pt informed, Rx in cabinet for pt pick up  

## 2012-03-09 ENCOUNTER — Telehealth: Payer: Self-pay | Admitting: Internal Medicine

## 2012-03-09 MED ORDER — OXYCODONE-ACETAMINOPHEN 5-325 MG PO TABS: 1.0000 | ORAL_TABLET | Freq: Four times a day (QID) | ORAL | Status: DC | PRN

## 2012-03-09 NOTE — Telephone Encounter (Signed)
Not due before November 23. - rx filled out stating same

## 2012-03-09 NOTE — Telephone Encounter (Signed)
Notified pt rx ready for pick-up.../lmb 

## 2012-03-09 NOTE — Telephone Encounter (Signed)
Requesting refill to pick up on oxycodone-acetaminophen.  Please call when ready.

## 2012-04-03 ENCOUNTER — Emergency Department (HOSPITAL_COMMUNITY)
Admission: EM | Admit: 2012-04-03 | Discharge: 2012-04-03 | Disposition: A | Discharge status: Home / Self Care | Payer: Medicare Other | Attending: Emergency Medicine | Admitting: Emergency Medicine

## 2012-04-03 ENCOUNTER — Encounter (HOSPITAL_COMMUNITY): Payer: Self-pay | Admitting: Emergency Medicine

## 2012-04-03 ENCOUNTER — Emergency Department (HOSPITAL_COMMUNITY): Payer: Medicare Other

## 2012-04-03 DIAGNOSIS — Z79899 Other long term (current) drug therapy: Secondary | ICD-10-CM | POA: Diagnosis not present

## 2012-04-03 DIAGNOSIS — I679 Cerebrovascular disease, unspecified: Secondary | ICD-10-CM | POA: Diagnosis not present

## 2012-04-03 DIAGNOSIS — Z7982 Long term (current) use of aspirin: Secondary | ICD-10-CM | POA: Diagnosis not present

## 2012-04-03 DIAGNOSIS — I252 Old myocardial infarction: Secondary | ICD-10-CM | POA: Diagnosis not present

## 2012-04-03 DIAGNOSIS — IMO0002 Reserved for concepts with insufficient information to code with codable children: Secondary | ICD-10-CM | POA: Diagnosis not present

## 2012-04-03 DIAGNOSIS — Z8679 Personal history of other diseases of the circulatory system: Secondary | ICD-10-CM | POA: Diagnosis not present

## 2012-04-03 DIAGNOSIS — S1093XA Contusion of unspecified part of neck, initial encounter: Secondary | ICD-10-CM | POA: Diagnosis not present

## 2012-04-03 DIAGNOSIS — I10 Essential (primary) hypertension: Secondary | ICD-10-CM | POA: Diagnosis not present

## 2012-04-03 DIAGNOSIS — Z951 Presence of aortocoronary bypass graft: Secondary | ICD-10-CM | POA: Diagnosis not present

## 2012-04-03 DIAGNOSIS — W010XXA Fall on same level from slipping, tripping and stumbling without subsequent striking against object, initial encounter: Secondary | ICD-10-CM | POA: Insufficient documentation

## 2012-04-03 DIAGNOSIS — Y92009 Unspecified place in unspecified non-institutional (private) residence as the place of occurrence of the external cause: Secondary | ICD-10-CM | POA: Insufficient documentation

## 2012-04-03 DIAGNOSIS — E785 Hyperlipidemia, unspecified: Secondary | ICD-10-CM | POA: Diagnosis not present

## 2012-04-03 DIAGNOSIS — J449 Chronic obstructive pulmonary disease, unspecified: Secondary | ICD-10-CM | POA: Diagnosis not present

## 2012-04-03 DIAGNOSIS — Z87891 Personal history of nicotine dependence: Secondary | ICD-10-CM | POA: Insufficient documentation

## 2012-04-03 DIAGNOSIS — W19XXXA Unspecified fall, initial encounter: Secondary | ICD-10-CM

## 2012-04-03 DIAGNOSIS — S0003XA Contusion of scalp, initial encounter: Secondary | ICD-10-CM | POA: Diagnosis not present

## 2012-04-03 DIAGNOSIS — Y9301 Activity, walking, marching and hiking: Secondary | ICD-10-CM | POA: Insufficient documentation

## 2012-04-03 DIAGNOSIS — T07XXXA Unspecified multiple injuries, initial encounter: Secondary | ICD-10-CM

## 2012-04-03 DIAGNOSIS — J4489 Other specified chronic obstructive pulmonary disease: Secondary | ICD-10-CM | POA: Insufficient documentation

## 2012-04-03 MED ORDER — TETANUS-DIPHTHERIA TOXOIDS TD 5-2 LFU IM INJ
0.5000 mL | INJECTION | Freq: Once | INTRAMUSCULAR | Status: AC
  Administered 2012-04-03: 0.5 mL via INTRAMUSCULAR
  Filled 2012-04-03: qty 0.5

## 2012-04-03 NOTE — ED Provider Notes (Signed)
History     CSN: [id number]  Arrival date & time 04/03/12  2014   First MD Initiated Contact with Patient 04/03/12 2141      Chief Complaint  Patient presents with  . Fall    (Consider location/radiation/quality/duration/timing/severity/associated sxs/prior treatment) HPI Comments: Patient states he was carrying grocery bags and rushing to get in and out of the rain when he slipped and fell in his carport, catching himself with his extended hands, and hitting his for head.  On the concrete.  Now presents with multiple abrasions to his forehead.  Small hematoma to the right frontal.  For head.  Abrasion to the left dorsal aspect of his wrist, with some surrounding bruising.  A small abrasion to his anterior surface, left knee.   Patient denies any loss of consciousness, nausea.  He can followup with her primary care physician as needed  Patient is a 71 y.o. male presenting with fall. The history is provided by the patient.  Fall The accident occurred 1 to 2 hours ago. The fall occurred while walking. He fell from a height of 3 to 5 ft. He landed on concrete. The volume of blood lost was minimal. Pertinent negatives include no fever, no numbness, no nausea and no headaches.    Past Medical History  Diagnosis Date  . 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 COPD (chronic obstructive pulmonary disease)   . Cerebrovascular disease   . AAA (abdominal aortic aneurysm)   . 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 Hyperlipidemia   . HTN (hypertension)   . CONGENITAL UNSPEC REDUCTION DEFORMITY LOWER LIMB   . TOBACCO USE, QUIT     Past Surgical History  Procedure Date  . Coronary artery bypass graft 11/03/2008    Barry Dienes - x4: left internal mammary artery to the distal left anterior descending, saphemous vein graft to the first circumflex marginal branch with a Y graft sequentiallly to a left posterolateral branch, saphenous vein graft to the distal right coronary  artery    Family History  Problem Relation Age of Onset  . Cirrhosis Mother     died at 29  . Colon cancer Neg Hx   . Stomach cancer Neg Hx     History  Substance Use Topics  . Smoking status: Former Games developer  . Smokeless tobacco: Never Used  . Alcohol Use: No     Comment: History of heavy alcohol use per pt. Quit many years ago1/1/ 2005      Review of Systems  Constitutional: Negative for fever and appetite change.  HENT: Negative.   Eyes: Negative for visual disturbance.  Respiratory: Negative.   Cardiovascular: Negative for chest pain.  Gastrointestinal: Negative for nausea.  Musculoskeletal: Positive for joint swelling.  Skin: Positive for wound.  Neurological: Negative for dizziness, weakness, numbness and headaches.    Allergies  Hydrocodone  Home Medications   Current Outpatient Rx  Name  Route  Sig  Dispense  Refill  . ACETAMINOPHEN 500 MG PO TABS   Oral   Take 500 mg by mouth every 6 (six) hours as needed. For pain         . ALBUTEROL SULFATE HFA 108 (90 BASE) MCG/ACT IN AERS   Inhalation   Inhale 2 puffs into the lungs every 6 (six) hours as needed for wheezing.   1 Inhaler   5   . AMLODIPINE BESYLATE 10 MG PO TABS   Oral  Take 10 mg by mouth daily.         . ASPIRIN 81 MG PO TABS   Oral   Take 81 mg by mouth daily.           . ATORVASTATIN CALCIUM 40 MG PO TABS   Oral   Take 40 mg by mouth daily.         . BUPROPION HCL ER (XL) 150 MG PO TB24   Oral   Take 150 mg by mouth daily.         [name] DIPHENHYDRAMINE HCL 25 MG PO CAPS   Oral   Take 25 mg by mouth every 6 (six) hours as needed. For itch/rash         . DOCUSATE SODIUM 100 MG PO CAPS   Oral   Take 100 mg by mouth 2 (two) times daily.           . FUROSEMIDE 40 MG PO TABS   Oral   Take 1 tablet (40 mg total) by mouth daily.   30 tablet   10   . GUAIFENESIN ER 600 MG PO TB12   Oral   Take 1,200 mg by mouth 2 (two) times daily.           [name] METOPROLOL TARTRATE  25 MG PO TABS   Oral   Take 0.5 tablets (12.5 mg total) by mouth 2 (two) times daily.   30 tablet   6   . MOMETASONE FUROATE 50 MCG/ACT NA SUSP   Nasal   Place 2 sprays into the nose daily.         [name] NIACIN ER (ANTIHYPERLIPIDEMIC) 500 MG PO TBCR   Oral   Take 2 tablets (1,000 mg total) by mouth daily.   60 tablet   12   . OXYCODONE-ACETAMINOPHEN 5-325 MG PO TABS   Oral   Take 1 tablet by mouth every 6 (six) hours as needed for pain.   120 tablet   0     Do not fill before 23rd of the month   . POTASSIUM CHLORIDE CRYS ER 20 MEQ PO TBCR   Oral   Take 1 tablet (20 mEq total) by mouth 2 (two) times daily.   60 tablet   10     BP 148/78  Pulse 61  Temp 97.5 F (36.4 C) (Oral)  Resp 16  Ht 5\' 11"  (1.803 m)  Wt 225 lb (102.059 kg)  BMI 31.38 kg/m2  SpO2 97%  Physical Exam  Constitutional: He is oriented to person, place, and time. He appears well-developed and well-nourished.  HENT:  Head: Normocephalic.    Right Ear: Tympanic membrane, external ear and ear canal normal.  Left Ear: Tympanic membrane, external ear and ear canal normal.  Eyes: Pupils are equal, round, and reactive to light.  Neck: Normal range of motion. Neck supple. No spinous process tenderness and no muscular tenderness present.  Cardiovascular: Normal rate.   Pulmonary/Chest: Effort normal.  Musculoskeletal: Normal range of motion. He exhibits tenderness. He exhibits no edema.  Neurological: He is alert and oriented to person, place, and time.  Skin: Skin is warm.       Superficial recurrent abrasions to 4 head, left wrist, left knee    ED Course  Procedures (including critical care time)  Labs Reviewed - No data to display Ct Head Wo Contrast  04/03/2012  *RADIOLOGY REPORT*  Clinical Data: Fall with forehead hematoma.  Aspirin use.  CT HEAD WITHOUT CONTRAST  Technique:  Contiguous axial images were obtained from the base of the skull through the vertex without contrast.  Comparison:  None.  Findings: Mild cerebral atrophy.  No ventricular dilatation.  No mass effect or midline shift.  No abnormal extra-axial fluid collections.  Gray-white matter junctions are distinct.  Basal cisterns are not effaced.  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 No evidence of acute intracranial hemorrhage.  Right anterior frontal subcutaneous scalp hematoma.  No underlying skull fractures.  Vascular calcifications.  Visualized paranasal sinuses and mastoid air cells are not opacified.  IMPRESSION: No acute intracranial abnormalities.   Original Report Authenticated By: Burman Nieves, M.D.      1. Fall   2. Abrasions of multiple sites       MDM  CT of head, reviewed, superficial abrasions have been cleaned and dressed with antibiotic ointment tetanus status has been updated        Arman Filter, NP 04/03/12 2233  Arman Filter, NP 04/03/12 2233

## 2012-04-03 NOTE — ED Notes (Signed)
Patient states that he fell this evening, slipped bringing in groceries.  Patient states that he did not get knocked out.  Denies any blood thinner use.

## 2012-04-03 NOTE — ED Provider Notes (Signed)
Medical screening examination/treatment/procedure(s) were performed by non-physician practitioner and as supervising physician I was immediately available for consultation/collaboration.   Doug B. Sheldon, MD 04/03/12 2248 

## 2012-04-03 NOTE — ED Notes (Addendum)
Pt reports he fell up a few stairs while carrying too many groceries at once, lost his balance and hit his head, denies LOC. Pt takes 81mg  of ASA/day. Pt has abrasions to forehead and small skin tear to left wrist.

## 2012-04-06 ENCOUNTER — Telehealth: Payer: Self-pay | Admitting: Internal Medicine

## 2012-04-06 NOTE — Telephone Encounter (Signed)
Patient Information:  Caller Name: [name]  Phone: [phone]  Patient: [name], [name]  Gender: Male  DOB: 08-Sep-1940  Age: 71 Years  PCP: [name] (Adults only)  Office Follow Up:  Does the office need to follow up with this patient?: No  Instructions For The Office: N/A  RN Note:  Pt has an appt scheduled 04/07/12 @ 1600 and as of today unable to drive d/t pt has poor vision and with the swelling over the right eye that impairs his vision further. Pt is going to try warm compresses to see if swelling decreases in hopes to make his appt.  Caller will call office if he needs to cancel appt.  Symptoms  Reason For Call & Symptoms: Pt had a fall on 04/03/12 and hit right side of his head and twisted his  left wrist.  Pt seen at the ED, had a CT.  Knot on pt's head has healed..  The blood from the injury has traveled down to the right eye causing swelling and discoloration around the right eye. Pt says he looks like he has a black eye.  No eye pain or injury to the eye.  Top of eyelid coming over the eye and that's impairing his vision.  No neuro symptoms. Caller asking for medication to help reduce the swelling.   Reviewed Health History In EMR: Yes  Reviewed Medications In EMR: Yes  Reviewed Allergies In EMR: Yes  Reviewed Surgeries / Procedures: Yes  Date of Onset of Symptoms: 04/05/2012  Guideline(s) Used Head Injury Protocol  Disposition Per Guideline: Home Care  Reason For Disposition Reached:  Minor head injury  Advice Given:  Apply Heat to the Area:  Beginning 48 hours after an injury, apply a warm washcloth or heating pad for 10 minutes three times a day.  This will help the blood be reabsorbed and promote healing  Expected Course:  Swelling most often is gone after 1 week.  Bruises fade away slowly over 1-2 weeks.  Call Back If:  Headache develops Numbness occurs Vomiting occurs. You become worse.

## 2012-04-07 ENCOUNTER — Encounter: Payer: Self-pay | Admitting: Internal Medicine

## 2012-04-07 ENCOUNTER — Ambulatory Visit (INDEPENDENT_AMBULATORY_CARE_PROVIDER_SITE_OTHER): Payer: Medicare Other | Admitting: Internal Medicine

## 2012-04-07 VITALS — BP 148/70 | HR 80 | Temp 97.0°F | Ht 72.0 in | Wt 220.0 lb

## 2012-04-07 DIAGNOSIS — IMO0002 Reserved for concepts with insufficient information to code with codable children: Secondary | ICD-10-CM | POA: Diagnosis not present

## 2012-04-07 DIAGNOSIS — G8929 Other chronic pain: Secondary | ICD-10-CM | POA: Diagnosis not present

## 2012-04-07 DIAGNOSIS — M549 Dorsalgia, unspecified: Secondary | ICD-10-CM

## 2012-04-07 DIAGNOSIS — S0180XA Unspecified open wound of other part of head, initial encounter: Secondary | ICD-10-CM

## 2012-04-07 DIAGNOSIS — I10 Essential (primary) hypertension: Secondary | ICD-10-CM

## 2012-04-07 DIAGNOSIS — S0001XA Abrasion of scalp, initial encounter: Secondary | ICD-10-CM

## 2012-04-07 DIAGNOSIS — S0181XA Laceration without foreign body of other part of head, initial encounter: Secondary | ICD-10-CM

## 2012-04-07 MED ORDER — OXYCODONE-ACETAMINOPHEN 5-325 MG PO TABS: 1.0000 | ORAL_TABLET | ORAL | Status: DC | PRN

## 2012-04-07 NOTE — Patient Instructions (Signed)
It was good to see you today. We have reviewed your prior records including labs and tests today Medications reviewed, no changes at this time. Refill on medication(s) as discussed today. Abrasions An abrasion is a cut or scrape of the skin. Abrasions do not go through all layers of the skin. HOME CARE  If a bandage (dressing) was put on your wound, change it as told by your doctor. If the bandage sticks, soak it off with warm.   Wash the area with water and soap 2 times a day. Rinse off the soap in a sink or under a bath or shower faucet. Pat the area dry with a clean towel.   Put on medicated cream (ointment) as told by your doctor.   Change your bandage right away if it gets wet or dirty.   Only take medicine as told by your doctor.   See your doctor within 24 to 48 hours to get your wound checked.   Check your wound for redness, puffiness (swelling), or yellowish-white fluid (pus).  GET HELP RIGHT AWAY IF:    You have more pain in the wound.   You have redness, swelling, or tenderness around the wound.   You have pus coming from the wound.   You have a fever.   You have a bad smell coming from the wound or bandage.  MAKE SURE YOU:    Understand these instructions.   Will watch your condition.   Will get help right away if you are not doing well or get worse.  Document Released: 09/24/2007 Document Revised: 06/30/2011 Document Reviewed: 03/11/2011 [facility] Patient Information 2013 [place], [place].   Laceration, Old, Not Sutured A laceration is a cut or lesion that goes through all layers of the skin and into the tissue just beneath the skin. Usually these are stitched up or held together with tape or glue shortly after an injury. However, if several or more hours have passed before getting care, too many germs (bacteria) get into the wound. Stitching it closed at this point brings the risk of infection. If your caregiver feels your laceration is too old, it is sometimes  left open and dressed regularly to allow healing from the bottom layer up. HOME CARE INSTRUCTIONS    You should change the dressing twice a day or as instructed by your caregiver. If the bandage or wound packing sticks, soak it off with soapy water. When you redress your wound, make sure that the dressing or packing goes all the way to the bottom of the wound. The top of the wound is kept open so it can heal from the bottom up. There is less chance for infection with this method.   Twice a day, wash the area with soap and water to remove all the creams or ointments if used. Rinse off the soap. Pat dry with a clean towel. Look for signs of infection (see below).   Re-apply creams or ointments if they were used to dress the wound. This also helps keep the bandage from sticking.   If the bandage becomes wet, dirty, or develops a foul smell, change it as soon as possible.   Only take over-the-counter or prescription medicines for pain, discomfort, or fever as directed by your caregiver.  You might need a tetanus shot now if:  You have no idea when you had the last one.   You have never had a tetanus shot before.   Your cut had dirt in it.  If you  need a tetanus shot, and you decide not to get one, there is a rare chance of getting tetanus. Sickness from tetanus can be serious. If you got a tetanus shot, your arm may swell, get red and warm to the touch at the shot site. This is common and not a problem. SEEK MEDICAL CARE IF:    There is redness, swelling, or increasing pain in the wound.   There is a red line that goes up your arm or leg.   Pus is coming from wound.   You develop an unexplained oral temperature above 102 F (38.9 C).   You notice a foul smell coming from the wound or dressing.   You notice something coming out of the wound such as wood or glass.   The wound is on your hand or foot and you find that you are unable to properly move a finger or toe.   There is severe  swelling around the wound causing pain and numbness.   There is a change in color in your arm, hand, leg, or foot.  MAKE SURE YOU:    Understand these instructions.   Will watch your condition.   Will get help right away if you are not doing well or get worse.  Document Released: 03/05/2006 Document Revised: 06/30/2011 Document Reviewed: 09/25/2008 [facility] Patient Information 2013 [place], [place].   Laceration, Old, Not Sutured A laceration is a cut or lesion that goes through all layers of the skin and into the tissue just beneath the skin. Usually these are stitched up or held together with tape or glue shortly after an injury. However, if several or more hours have passed before getting care, too many germs (bacteria) get into the wound. Stitching it closed at this point brings the risk of infection. If your caregiver feels your laceration is too old, it is sometimes left open and dressed regularly to allow healing from the bottom layer up. HOME CARE INSTRUCTIONS    You should change the dressing twice a day or as instructed by your caregiver. If the bandage or wound packing sticks, soak it off with soapy water. When you redress your wound, make sure that the dressing or packing goes all the way to the bottom of the wound. The top of the wound is kept open so it can heal from the bottom up. There is less chance for infection with this method.   Twice a day, wash the area with soap and water to remove all the creams or ointments if used. Rinse off the soap. Pat dry with a clean towel. Look for signs of infection (see below).   Re-apply creams or ointments if they were used to dress the wound. This also helps keep the bandage from sticking.   If the bandage becomes wet, dirty, or develops a foul smell, change it as soon as possible.   Only take over-the-counter or prescription medicines for pain, discomfort, or fever as directed by your caregiver.  You might need a tetanus shot now  if:  You have no idea when you had the last one.   You have never had a tetanus shot before.   Your cut had dirt in it.  If you need a tetanus shot, and you decide not to get one, there is a rare chance of getting tetanus. Sickness from tetanus can be serious. If you got a tetanus shot, your arm may swell, get red and warm to the touch at the shot site. This is  common and not a problem. SEEK MEDICAL CARE IF:    There is redness, swelling, or increasing pain in the wound.   There is a red line that goes up your arm or leg.   Pus is coming from wound.   You develop an unexplained oral temperature above 102 F (38.9 C).   You notice a foul smell coming from the wound or dressing.   You notice something coming out of the wound such as wood or glass.   The wound is on your hand or foot and you find that you are unable to properly move a finger or toe.   There is severe swelling around the wound causing pain and numbness.   There is a change in color in your arm, hand, leg, or foot.  MAKE SURE YOU:    Understand these instructions.   Will watch your condition.   Will get help right away if you are not doing well or get worse.  Document Released: 03/05/2006 Document Revised: 06/30/2011 Document Reviewed: 09/25/2008 [facility] Patient Information 2013 [place], [place].

## 2012-04-07 NOTE — Assessment & Plan Note (Signed)
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

## 2012-04-07 NOTE — Assessment & Plan Note (Signed)
BP Readings from Last 3 Encounters:  04/07/12 148/70  04/03/12 148/78  12/05/11 136/68   The current medical regimen is generally effective;  continue present plan and medications.

## 2012-04-07 NOTE — Progress Notes (Signed)
Subjective:    Patient ID: [name], male    DOB: 04-27-1940, 71 y.o.   MRN: [id number]  HPI  Here for ER follow up - accidental fall 04/03/12  Past Medical History  Diagnosis Date  . 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 COPD (chronic obstructive pulmonary disease)   . Cerebrovascular disease   . AAA (abdominal aortic aneurysm)   . 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 Hyperlipidemia   . HTN (hypertension)   . CONGENITAL UNSPEC REDUCTION DEFORMITY LOWER LIMB   . TOBACCO USE, QUIT     Review of Systems  Constitutional: Negative for fever and fatigue.  Respiratory: Negative for cough and shortness of breath.   Cardiovascular: Negative for chest pain and palpitations.  Neurological: Negative for dizziness, syncope, facial asymmetry and headaches.       Objective:   Physical Exam  BP 148/70  Pulse 80  Temp 97 F (36.1 C) (Oral)  Ht 6' (1.829 m)  Wt 220 lb (99.791 kg)  BMI 29.84 kg/m2  SpO2 97% Wt Readings from Last 3 Encounters:  04/07/12 220 lb (99.791 kg)  04/03/12 225 lb (102.059 kg)  10/22/11 223 lb 1.3 oz (101.188 kg)   Constitutional:  He appears well-developed and well-nourished. No distress.  Neck: Normal range of motion. Neck supple. No JVD present. No thyromegaly present.  Cardiovascular: Normal rate, regular rhythm and normal heart sounds.  No murmur heard. no BLE edema Musculoskeletal: no gross deformities - Back: full range of motion of thoracic and lumbar spine. Non tender to palpation. DTR's are symmetrically intact. Sensation intact in all dermatomes of the lower extremities. Full strength to manual muscle testing. ambulates with chronically antalgic gait due to shortened LLE. Neurological: he is alert and oriented to person, place, and time. No cranial nerve deficit. Coordination normal.  Skin: hematoma around R eye - abrasion R crown, small laceration L frontal brown, B hands over MCPs and B knees -no infection.   No erythema or ulceration.  Psychiatric: he has a normal mood and affect. behavior is normal. Judgment and thought content normal.   Lab Results  Component Value Date   WBC 6.1 12/06/2010   HGB 14.4 12/06/2010   HCT 42.8 12/06/2010   PLT 200.0 12/06/2010   GLUCOSE 111* 10/01/2011   CHOL 128 10/01/2011   TRIG 100.0 10/01/2011   HDL 46.30 10/01/2011   LDLCALC 62 10/01/2011   ALT 29 10/01/2011   AST 31 10/01/2011   NA 137 10/01/2011   K 3.8 10/01/2011   CL 101 10/01/2011   CREATININE 0.9 10/01/2011   BUN 12 10/01/2011   CO2 27 10/01/2011   TSH 1.66 12/06/2010   INR 1.5 11/03/2008   HGBA1C  Value: 6.0 (NOTE) The ADA recommends the following therapeutic goal for glycemic control related to Hgb A1c measurement: Goal of therapy: <6.5 Hgb A1c  Reference: American Diabetes Association: Clinical Practice Recommendations 2010, Diabetes Care, 2010, 33: (Suppl  1). 11/02/2008   Ct Head Wo Contrast  04/03/2012  *RADIOLOGY REPORT*  Clinical Data: Fall with forehead hematoma.  Aspirin use.  CT HEAD WITHOUT CONTRAST  Technique:  Contiguous axial images were obtained from the base of the skull through the vertex without contrast.  Comparison: None.  Findings: Mild cerebral atrophy.  No ventricular dilatation.  No mass effect or midline shift.  No abnormal extra-axial fluid collections.  Gray-white matter junctions are distinct.  Basal cisterns are not effaced.  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 No evidence of acute intracranial hemorrhage.  Right anterior frontal subcutaneous scalp hematoma.  No underlying skull fractures.  Vascular calcifications.  Visualized paranasal sinuses and mastoid air cells are not opacified.  IMPRESSION: No acute intracranial abnormalities.   Original Report Authenticated By: Burman Nieves, M.D.      Assessment & Plan:   Periorbital hematoma and scalp abrasion s/p accidental fall 04/03/12 ER visit reviewed The patient is reassured that these symptoms do not appear to represent a serious  or threatening condition.  Advised on conservative wound care  Time spent with pt today 25 minutes, greater than 50% time spent counseling patient on fall and medication review. Also review of ER records

## 2012-04-21 DIAGNOSIS — T402X5A Adverse effect of other opioids, initial encounter: Secondary | ICD-10-CM

## 2012-04-21 DIAGNOSIS — K5903 Drug induced constipation: Secondary | ICD-10-CM

## 2012-04-21 HISTORY — DX: Drug induced constipation: K59.03

## 2012-04-21 HISTORY — DX: Adverse effect of other opioids, initial encounter: T40.2X5A

## 2012-05-06 ENCOUNTER — Telehealth: Payer: Self-pay | Admitting: Internal Medicine

## 2012-05-06 MED ORDER — OXYCODONE-ACETAMINOPHEN 5-325 MG PO TABS: 1.0000 | ORAL_TABLET | ORAL | Status: DC | PRN

## 2012-05-06 NOTE — Telephone Encounter (Signed)
Patient is requesting refill on oxycodone, call when ready for pick up

## 2012-05-06 NOTE — Telephone Encounter (Signed)
Notified pt with md response.../lmb 

## 2012-05-06 NOTE — Telephone Encounter (Signed)
ok 

## 2012-05-21 ENCOUNTER — Other Ambulatory Visit: Payer: Self-pay | Admitting: Cardiology

## 2012-05-26 ENCOUNTER — Other Ambulatory Visit: Payer: Self-pay | Admitting: Cardiology

## 2012-05-28 ENCOUNTER — Other Ambulatory Visit: Payer: Self-pay | Admitting: Cardiology

## 2012-05-31 ENCOUNTER — Telehealth: Payer: Self-pay | Admitting: Internal Medicine

## 2012-05-31 MED ORDER — OXYCODONE-ACETAMINOPHEN 5-325 MG PO TABS: 1.0000 | ORAL_TABLET | ORAL | Status: DC | PRN

## 2012-05-31 NOTE — Telephone Encounter (Signed)
[name] to refill today [name]

## 2012-05-31 NOTE — Telephone Encounter (Signed)
Pt is requesting a refill on his oxycodone.  It is due Thurs.  He wants to pick up the RX today or in the morning due to the snow forecast.  He lives in near [place] and does not want to drive in bad weather.

## 2012-05-31 NOTE — Telephone Encounter (Signed)
Notified pt rx ready for pick-up.../lmb 

## 2012-06-24 ENCOUNTER — Other Ambulatory Visit: Payer: Self-pay

## 2012-06-24 MED ORDER — OXYCODONE-ACETAMINOPHEN 5-325 MG PO TABS: 1.0000 | ORAL_TABLET | ORAL | Status: DC | PRN

## 2012-06-24 NOTE — Telephone Encounter (Signed)
Pt informed, Rx in cabinet for pt pick up  

## 2012-07-19 ENCOUNTER — Other Ambulatory Visit: Payer: Self-pay

## 2012-07-19 MED ORDER — OXYCODONE-ACETAMINOPHEN 5-325 MG PO TABS: 1.0000 | ORAL_TABLET | ORAL | Status: DC | PRN

## 2012-07-19 NOTE — Telephone Encounter (Signed)
Pt informed, Rx in cabinet for pt pick up  

## 2012-07-20 ENCOUNTER — Other Ambulatory Visit: Payer: Self-pay | Admitting: Internal Medicine

## 2012-08-07 ENCOUNTER — Other Ambulatory Visit: Payer: Self-pay | Admitting: Cardiology

## 2012-08-12 ENCOUNTER — Telehealth: Payer: Self-pay

## 2012-08-12 MED ORDER — OXYCODONE-ACETAMINOPHEN 5-325 MG PO TABS: 1.0000 | ORAL_TABLET | ORAL | Status: DC | PRN

## 2012-08-12 NOTE — Telephone Encounter (Signed)
Pt notified he can pick up his prescription

## 2012-08-12 NOTE — Telephone Encounter (Signed)
done

## 2012-08-12 NOTE — Telephone Encounter (Signed)
Phone call from pt requesting a refill on his Percocet he takes 1 every 4 hours. Please advise.

## 2012-08-14 ENCOUNTER — Other Ambulatory Visit: Payer: Self-pay | Admitting: Cardiology

## 2012-08-17 ENCOUNTER — Other Ambulatory Visit: Payer: Self-pay | Admitting: Cardiology

## 2012-08-18 ENCOUNTER — Other Ambulatory Visit: Payer: Self-pay | Admitting: Cardiology

## 2012-08-21 ENCOUNTER — Other Ambulatory Visit: Payer: Self-pay | Admitting: Cardiology

## 2012-08-24 ENCOUNTER — Other Ambulatory Visit: Payer: Self-pay | Admitting: Cardiology

## 2012-08-24 NOTE — Telephone Encounter (Signed)
Fax Received. Refill Completed. Ione [place] Chowoe (R.M.A)   

## 2012-09-07 ENCOUNTER — Telehealth: Payer: Self-pay | Admitting: Internal Medicine

## 2012-09-07 MED ORDER — OXYCODONE-ACETAMINOPHEN 5-325 MG PO TABS: 1.0000 | ORAL_TABLET | ORAL | Status: DC | PRN

## 2012-09-07 NOTE — Telephone Encounter (Signed)
ok 

## 2012-09-07 NOTE — Telephone Encounter (Signed)
The patient called hoping to get a refill of his percocet rx.  His callback number is - [phone]

## 2012-09-07 NOTE — Telephone Encounter (Signed)
Notified pt with md response.../lmb 

## 2012-09-13 ENCOUNTER — Other Ambulatory Visit: Payer: Self-pay | Admitting: Cardiology

## 2012-09-15 ENCOUNTER — Other Ambulatory Visit: Payer: Self-pay | Admitting: Cardiology

## 2012-09-16 ENCOUNTER — Other Ambulatory Visit: Payer: Self-pay | Admitting: Cardiology

## 2012-09-16 ENCOUNTER — Encounter (INDEPENDENT_AMBULATORY_CARE_PROVIDER_SITE_OTHER): Payer: Medicare Other

## 2012-09-16 DIAGNOSIS — I6529 Occlusion and stenosis of unspecified carotid artery: Secondary | ICD-10-CM

## 2012-09-30 ENCOUNTER — Telehealth: Payer: Self-pay

## 2012-09-30 NOTE — Telephone Encounter (Signed)
Rx written 09/07/2012 #120 with 0 refill-refill would not be due until 10/08/2012 but pt states that rx is only for 20 day supply-please advise.

## 2012-09-30 NOTE — Telephone Encounter (Signed)
Ok to refill 

## 2012-09-30 NOTE — Telephone Encounter (Signed)
Patient called [facility] requesting refill on percocet. Patient last seen 12/13 and need follow up appt.

## 2012-09-30 NOTE — Telephone Encounter (Signed)
Pt was told to come back in a year when he had his last physical in July 2013.  I have scheduled him for the next available physical on Aug 18.  Can he get a refill to last until then?  He is almost out.

## 2012-10-01 NOTE — Telephone Encounter (Signed)
That is only a 20 day supply but Dr. Felicity Coyer wrote in her comment on the prescription that pt may only fill this medication once every 30 days and she has only been giving him 120 from what I can see. He will need to wait until the 20.

## 2012-10-07 ENCOUNTER — Ambulatory Visit (INDEPENDENT_AMBULATORY_CARE_PROVIDER_SITE_OTHER): Payer: Medicare Other | Admitting: Cardiology

## 2012-10-07 ENCOUNTER — Encounter: Payer: Self-pay | Admitting: Cardiology

## 2012-10-07 VITALS — BP 124/70 | HR 74 | Ht 72.0 in | Wt 215.0 lb

## 2012-10-07 DIAGNOSIS — I10 Essential (primary) hypertension: Secondary | ICD-10-CM

## 2012-10-07 DIAGNOSIS — I2510 Atherosclerotic heart disease of native coronary artery without angina pectoris: Secondary | ICD-10-CM | POA: Diagnosis not present

## 2012-10-07 DIAGNOSIS — I714 Abdominal aortic aneurysm, without rupture, unspecified: Secondary | ICD-10-CM

## 2012-10-07 DIAGNOSIS — E785 Hyperlipidemia, unspecified: Secondary | ICD-10-CM | POA: Diagnosis not present

## 2012-10-07 NOTE — Progress Notes (Signed)
HPI: Pleasant male for fu of CAD. Patient had previous NSTEMI followed by CABG on November 03, 2008. He had left internal mammary artery to distal left anterior descending, saphenous vein graft to first circumflex marginal branch with Y graft sequential to left posterolateral branch, saphenous vein graft to distal right coronary artery. Echocardiogram in May of 2012 showed an ejection fraction of 55-60% and mild left atrial enlargement. Last carotid Dopplers in May of 2014 revealed a 40-59% left stenosis and 0-39% right stenosis. FU recommended in 2 years. Abdominal CT in June of 2013 showed an infrarenal abdominal aortic aneurysm measuring 3.1 by 3 cm. I last saw him in May of 2013. Since then, the patient has dyspnea with more extreme activities but not with routine activities. It is relieved with rest. It is not associated with chest pain. There is no orthopnea, PND or pedal edema. There is no syncope or palpitations. There is no exertional chest pain.   Current Outpatient Prescriptions  Medication Sig Dispense Refill  . acetaminophen (TYLENOL) 500 MG tablet Take 500 mg by mouth every 6 (six) hours as needed. For pain      . albuterol (PROVENTIL HFA;VENTOLIN HFA) 108 (90 BASE) MCG/ACT inhaler Inhale 2 puffs into the lungs every 6 (six) hours as needed for wheezing.  1 Inhaler  5  . amLODipine (NORVASC) 10 MG tablet TAKE 1 TABLET (10 MG TOTAL) BY MOUTH DAILY.  30 tablet  6  . aspirin 81 MG tablet Take 81 mg by mouth daily.        [name] atorvastatin (LIPITOR) 40 MG tablet TAKE ONE TABLET BY MOUTH DAILY.  30 tablet  6  . buPROPion (WELLBUTRIN XL) 150 MG 24 hr tablet Take 150 mg by mouth daily.      . Chlorpheniramine-DM (CORICIDIN COUGH/COLD) 4-30 MG TABS Take by mouth as needed.      . diphenhydrAMINE (BENADRYL) 25 mg capsule Take 25 mg by mouth every 6 (six) hours as needed. For itch/rash      . docusate sodium (STOOL SOFTENER) 100 MG capsule Take 100 mg by mouth 2 (two) times daily.        . furosemide  (LASIX) 40 MG tablet TAKE 1 TABLET (40 MG TOTAL) BY MOUTH DAILY.  30 tablet  5  . guaiFENesin (MUCINEX) 600 MG 12 hr tablet Take 1,200 mg by mouth 2 (two) times daily.        . metoprolol tartrate (LOPRESSOR) 25 MG tablet TAKE 1/2 TABLET BY MOUTH TWICE A DAY  30 tablet  6  . mometasone (NASONEX) 50 MCG/ACT nasal spray Place 2 sprays into the nose daily.      . niacin (NIASPAN) 500 MG CR tablet TAKE 2 TABLETS (1,000 MG TOTAL) BY MOUTH DAILY.  60 tablet  12  . oxyCODONE-acetaminophen (PERCOCET/ROXICET) 5-325 MG per tablet Take 1 tablet by mouth every 4 (four) hours as needed for pain.  120 tablet  0  . potassium chloride SA (K-DUR,KLOR-CON) 20 MEQ tablet TAKE 1 TABLET (20 MEQ TOTAL) BY MOUTH 2 (TWO) TIMES DAILY.  60 tablet  5  . Sennosides 25 MG TABS Take by mouth daily.       No current facility-administered medications for this visit.     Past Medical History  Diagnosis Date  . 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 COPD (chronic obstructive pulmonary disease)   . Cerebrovascular disease   . AAA (abdominal aortic aneurysm)   . 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 Hyperlipidemia   . HTN (hypertension)   . CONGENITAL UNSPEC REDUCTION DEFORMITY LOWER LIMB   . TOBACCO USE, QUIT     Past Surgical History  Procedure Laterality Date  . Coronary artery bypass graft  11/03/2008    Barry Dienes - x4: left internal mammary artery to the distal left anterior descending, saphemous vein graft to the first circumflex marginal branch with a Y graft sequentiallly to a left posterolateral branch, saphenous vein graft to the distal right coronary artery    History   Social History  . Marital Status: Widowed    Spouse Name: N/A    Number of Children: N/A  . Years of Education: N/A   Occupational History  . Not on file.   Social History Main Topics  . Smoking status: Former Games developer  . Smokeless tobacco: Never Used  . Alcohol Use: No     Comment: History of heavy  alcohol use per pt. Quit many years ago1/1/ 2005  . Drug Use: No  . Sexually Active: Not on file   Other Topics Concern  . Not on file   Social History Narrative   lives alone here in [place].  He has 1 son, who lives in the [place], and he has     an uncle, who lives nearby as well.     ROS: no fevers or chills, productive cough, hemoptysis, dysphasia, odynophagia, melena, hematochezia, dysuria, hematuria, rash, seizure activity, orthopnea, PND, pedal edema, claudication. Remaining systems are negative.  Physical Exam: Well-developed well-nourished in no acute distress.  Skin is warm and dry.  HEENT is normal.  Neck is supple.  Chest is clear to auscultation with normal expansion.  Cardiovascular exam is regular rate and rhythm.  Abdominal exam nontender or distended. No masses palpated. Extremities show 1+ ankle edema. neuro grossly intact  ECG sinus rhythm with first degree AV block. Normal axis. Left ventricular hypertrophy, nonspecific ST changes.

## 2012-10-07 NOTE — Assessment & Plan Note (Signed)
Continue aspirin and statin. 

## 2012-10-07 NOTE — Assessment & Plan Note (Signed)
Continue statin. Lipids and liver monitored by primary care. 

## 2012-10-07 NOTE — Assessment & Plan Note (Signed)
Continue present blood pressure medications. Potassium and renal function monitored by primary care. 

## 2012-10-07 NOTE — Patient Instructions (Signed)
Your physician wants you to follow-up in: ONE YEAR WITH DR Shelda Pal will receive a reminder letter in the mail two months in advance. If you don't receive a letter, please call our office to schedule the follow-up appointment.   CTA OF ABDOMEN TO FOLLOW UP ABDOMINAL ANEURYSM

## 2012-10-07 NOTE — Assessment & Plan Note (Signed)
Continue aspirin and statin. Followup carotid Dopplers May 2016. 

## 2012-10-07 NOTE — Assessment & Plan Note (Signed)
Plan repeat CTA to reassess aneurysm.

## 2012-10-08 ENCOUNTER — Other Ambulatory Visit: Payer: Self-pay

## 2012-10-08 LAB — BASIC METABOLIC PANEL
BUN: 13 mg/dL (ref 6–23)
CO2: 27 mEq/L (ref 19–32)
Calcium: 9.2 mg/dL (ref 8.4–10.5)
Chloride: 106 mEq/L (ref 96–112)
Creatinine, Ser: 0.9 mg/dL (ref 0.4–1.5)
GFR: 86.46 mL/min (ref >60.00)
Glucose, Bld: 123 mg/dL — ABNORMAL HIGH (ref 70–99)
Potassium: 4.2 mEq/L (ref 3.5–5.1)
Sodium: 140 mEq/L (ref 135–145)

## 2012-10-08 MED ORDER — OXYCODONE-ACETAMINOPHEN 5-325 MG PO TABS: 1.0000 | ORAL_TABLET | ORAL | Status: DC | PRN

## 2012-10-08 NOTE — Telephone Encounter (Signed)
Pt notified to pick up and rx upfront

## 2012-10-08 NOTE — Telephone Encounter (Signed)
Pt called back as directed to have percocet filled thanks   ---->(See previous phone note)  Nicki Reaper, NP at 10/01/2012 7:54 AM   Status: Signed            That is only a 20 day supply but Dr. Felicity Coyer wrote in her comment on the prescription that pt may only fill this medication once every 30 days and she has only been giving him 120 from what I can see. He will need to wait until the 20.

## 2012-10-08 NOTE — Telephone Encounter (Signed)
Ok to refill 

## 2012-10-14 ENCOUNTER — Other Ambulatory Visit: Payer: Medicare Other

## 2012-10-18 ENCOUNTER — Ambulatory Visit: Payer: Medicare Other | Admitting: Internal Medicine

## 2012-10-18 ENCOUNTER — Telehealth: Payer: Self-pay | Admitting: Cardiology

## 2012-10-18 NOTE — Telephone Encounter (Signed)
Spoke with pt, questions regarding dr Carole Civil appt and CT scan answered.

## 2012-10-18 NOTE — Telephone Encounter (Signed)
New Prob     Pt has some questions regarding blood work and upcoming scan. Please call.

## 2012-10-21 ENCOUNTER — Other Ambulatory Visit: Payer: Medicare Other

## 2012-10-28 ENCOUNTER — Ambulatory Visit (INDEPENDENT_AMBULATORY_CARE_PROVIDER_SITE_OTHER)
Admission: RE | Admit: 2012-10-28 | Discharge: 2012-10-28 | Disposition: A | Discharge status: Home / Self Care | Payer: Medicare Other | Source: Ambulatory Visit | Attending: Cardiology | Admitting: Cardiology

## 2012-10-28 DIAGNOSIS — E785 Hyperlipidemia, unspecified: Secondary | ICD-10-CM | POA: Diagnosis not present

## 2012-10-28 DIAGNOSIS — I714 Abdominal aortic aneurysm, without rupture: Secondary | ICD-10-CM

## 2012-10-28 DIAGNOSIS — I2510 Atherosclerotic heart disease of native coronary artery without angina pectoris: Secondary | ICD-10-CM | POA: Diagnosis not present

## 2012-10-28 DIAGNOSIS — I10 Essential (primary) hypertension: Secondary | ICD-10-CM

## 2012-10-28 MED ORDER — IOHEXOL 350 MG/ML SOLN: 100.0000 mL | Freq: Once | INTRAVENOUS | Status: AC | PRN

## 2012-11-05 ENCOUNTER — Other Ambulatory Visit: Payer: Self-pay | Admitting: *Deleted

## 2012-11-05 MED ORDER — OXYCODONE-ACETAMINOPHEN 5-325 MG PO TABS: 1.0000 | ORAL_TABLET | ORAL | Status: DC | PRN

## 2012-11-05 NOTE — Telephone Encounter (Signed)
Notified pt rx ready for pick-up.../lmb 

## 2012-11-05 NOTE — Telephone Encounter (Signed)
Left msg on vm requesting refill on his oxycodone...Raechel Chute

## 2012-11-27 ENCOUNTER — Other Ambulatory Visit: Payer: Self-pay | Admitting: Internal Medicine

## 2012-12-01 ENCOUNTER — Other Ambulatory Visit: Payer: Self-pay | Admitting: Internal Medicine

## 2012-12-06 ENCOUNTER — Encounter: Payer: Medicare Other | Admitting: Internal Medicine

## 2012-12-06 ENCOUNTER — Telehealth: Payer: Self-pay | Admitting: *Deleted

## 2012-12-06 MED ORDER — OXYCODONE-ACETAMINOPHEN 5-325 MG PO TABS: 1.0000 | ORAL_TABLET | ORAL | Status: DC | PRN

## 2012-12-06 NOTE — Telephone Encounter (Signed)
Spoke with pt advised Rx ready for pick up 

## 2012-12-06 NOTE — Telephone Encounter (Signed)
Ok -printed and signed 

## 2012-12-06 NOTE — Telephone Encounter (Signed)
Pt called requesting a refill on Oxycodone.  Please advise 

## 2012-12-13 ENCOUNTER — Other Ambulatory Visit: Payer: Self-pay | Admitting: Cardiology

## 2013-01-03 ENCOUNTER — Telehealth: Payer: Self-pay | Admitting: *Deleted

## 2013-01-03 NOTE — Telephone Encounter (Signed)
[facility] Triage Call Report Triage Record Num: [id number] Operator: Albertine Grates Patient Name: [name] Call Date & Time: 01/01/2013 6:12:28PM Patient Phone: [phone] PCP: Rene Paci Patient Gender: Male PCP Fax : [phone] Patient DOB: 1941-04-11 [facility] Name: [facility] Reason for Call: Caller: Kaylee/Patient; PCP: Rene Paci (Adults only); CB#: [phone]; Has nasal congestion since 9-11. When lays down, drainage accumulates in back of throat. Afebrile. Home care advice given per Upper Respiratory Infection protocol. Protocol(s) Used: Upper Respiratory Infection (URI) Recommended Outcome per Protocol: Provide Home/Self Care Reason for Outcome: New onset of the following symptoms: nasal congestion; runny nose; sneezing; itchy or mild sore throat. May also have cough; irritated eyes or a mild headache or a low grade fever up to 101.5 F (38.6C). Care Advice: ~ Use a cool mist humidifier to moisten air. Be sure to clean according to manufacturer's instructions. ~ Consider use of a saline nasal spray per package directions to help relieve nasal congestion. Drink more fluids -- water, low-sugar juices, tea and warm soup, especially chicken broth, are options. Avoid caffeinated or alcoholic beverages because they can increase the chance of dehydration. ~ Mild symptoms of a cold can be expected to last 7 to 10 days. Sometimes, a cough associated with a cold can last up to 3 weeks. Over-the-counter cold medications may temporarily relieve the symptoms, but do not shorten the length of the cold. ~ A warm, moist compress placed on face, over eyes for 15 to 20 minutes, 5 to 6 times a day, may help relieve the congestion. ~ 09/

## 2013-01-04 ENCOUNTER — Telehealth: Payer: Self-pay | Admitting: *Deleted

## 2013-01-04 MED ORDER — OXYCODONE-ACETAMINOPHEN 5-325 MG PO TABS: 1.0000 | ORAL_TABLET | Freq: Four times a day (QID) | ORAL | Status: DC | PRN

## 2013-01-04 NOTE — Telephone Encounter (Signed)
Done hardcopy to robin  

## 2013-01-04 NOTE — Telephone Encounter (Signed)
Requesting refill on his oxycodone. MD is out of office. Pls advise on refill...Raechel Chute

## 2013-01-04 NOTE — Telephone Encounter (Signed)
Called the patient informed to pickup hardcopy at the front desk. 

## 2013-01-12 ENCOUNTER — Other Ambulatory Visit: Payer: Self-pay | Admitting: Internal Medicine

## 2013-01-13 ENCOUNTER — Other Ambulatory Visit: Payer: Self-pay | Admitting: Internal Medicine

## 2013-01-15 ENCOUNTER — Other Ambulatory Visit: Payer: Self-pay | Admitting: Internal Medicine

## 2013-02-03 ENCOUNTER — Telehealth: Payer: Self-pay | Admitting: *Deleted

## 2013-02-03 MED ORDER — OXYCODONE-ACETAMINOPHEN 5-325 MG PO TABS: 1.0000 | ORAL_TABLET | Freq: Four times a day (QID) | ORAL | Status: DC | PRN

## 2013-02-03 NOTE — Telephone Encounter (Signed)
Ok done

## 2013-02-03 NOTE — Telephone Encounter (Signed)
Pt called requesting status  on pain med. [name] inform pt rx ready for pick=up...lmb

## 2013-02-03 NOTE — Telephone Encounter (Signed)
Pt called requesting Oxycodone refill.  Please advise 

## 2013-02-06 DIAGNOSIS — Z23 Encounter for immunization: Secondary | ICD-10-CM | POA: Diagnosis not present

## 2013-02-07 ENCOUNTER — Encounter: Payer: Medicare Other | Admitting: Internal Medicine

## 2013-03-04 ENCOUNTER — Telehealth: Payer: Self-pay

## 2013-03-04 MED ORDER — OXYCODONE-ACETAMINOPHEN 5-325 MG PO TABS: 1.0000 | ORAL_TABLET | Freq: Four times a day (QID) | ORAL | Status: DC | PRN

## 2013-03-04 NOTE — Telephone Encounter (Signed)
Patient is aware script can be picked up

## 2013-03-04 NOTE — Telephone Encounter (Signed)
ok 

## 2013-03-04 NOTE — Telephone Encounter (Signed)
Phone call from patient requesting a refill on his Oxycodone. He states he will have his son come by to pick up the script.

## 2013-03-05 ENCOUNTER — Other Ambulatory Visit: Payer: Self-pay | Admitting: Internal Medicine

## 2013-03-08 ENCOUNTER — Other Ambulatory Visit: Payer: Self-pay | Admitting: Cardiology

## 2013-03-11 ENCOUNTER — Other Ambulatory Visit: Payer: Self-pay | Admitting: Cardiology

## 2013-03-29 ENCOUNTER — Telehealth: Payer: Self-pay | Admitting: *Deleted

## 2013-03-29 MED ORDER — OXYCODONE-ACETAMINOPHEN 5-325 MG PO TABS: 1.0000 | ORAL_TABLET | Freq: Four times a day (QID) | ORAL | Status: DC | PRN

## 2013-03-29 NOTE — Telephone Encounter (Signed)
Pt called requesting Oxycodone refill. Last OV 12.18.13. Please advise

## 2013-03-29 NOTE — Telephone Encounter (Signed)
Ok to fill now but needs OV at least once q98mo - please arrange prior to any future refills!

## 2013-03-29 NOTE — Telephone Encounter (Signed)
Notified pt rx ready for pick-up.../lmb 

## 2013-04-06 ENCOUNTER — Other Ambulatory Visit: Payer: Self-pay | Admitting: *Deleted

## 2013-04-06 MED ORDER — ATORVASTATIN CALCIUM 40 MG PO TABS: ORAL_TABLET | ORAL | Status: DC

## 2013-04-06 MED ORDER — AMLODIPINE BESYLATE 10 MG PO TABS: ORAL_TABLET | ORAL | Status: DC

## 2013-04-06 MED ORDER — BUPROPION HCL ER (XL) 150 MG PO TB24: ORAL_TABLET | ORAL | Status: DC

## 2013-04-06 MED ORDER — NIACIN ER (ANTIHYPERLIPIDEMIC) 500 MG PO TBCR: EXTENDED_RELEASE_TABLET | ORAL | Status: DC

## 2013-04-06 MED ORDER — METOPROLOL TARTRATE 25 MG PO TABS: ORAL_TABLET | ORAL | Status: DC

## 2013-04-11 ENCOUNTER — Ambulatory Visit (INDEPENDENT_AMBULATORY_CARE_PROVIDER_SITE_OTHER): Payer: Medicare Other | Admitting: Internal Medicine

## 2013-04-11 ENCOUNTER — Encounter: Payer: Self-pay | Admitting: Internal Medicine

## 2013-04-11 ENCOUNTER — Other Ambulatory Visit (INDEPENDENT_AMBULATORY_CARE_PROVIDER_SITE_OTHER): Payer: Medicare Other

## 2013-04-11 VITALS — BP 120/72 | HR 64 | Temp 97.0°F | Wt 220.0 lb

## 2013-04-11 DIAGNOSIS — E785 Hyperlipidemia, unspecified: Secondary | ICD-10-CM

## 2013-04-11 DIAGNOSIS — G8929 Other chronic pain: Secondary | ICD-10-CM

## 2013-04-11 DIAGNOSIS — R7309 Other abnormal glucose: Secondary | ICD-10-CM | POA: Diagnosis not present

## 2013-04-11 DIAGNOSIS — Z0000 Encounter for general adult medical examination without abnormal findings: Secondary | ICD-10-CM

## 2013-04-11 DIAGNOSIS — R739 Hyperglycemia, unspecified: Secondary | ICD-10-CM

## 2013-04-11 DIAGNOSIS — M549 Dorsalgia, unspecified: Secondary | ICD-10-CM

## 2013-04-11 DIAGNOSIS — I10 Essential (primary) hypertension: Secondary | ICD-10-CM

## 2013-04-11 DIAGNOSIS — I714 Abdominal aortic aneurysm, without rupture, unspecified: Secondary | ICD-10-CM

## 2013-04-11 LAB — BASIC METABOLIC PANEL
BUN: 13 mg/dL (ref 6–23)
CO2: 25 mEq/L (ref 19–32)
Calcium: 8.7 mg/dL (ref 8.4–10.5)
Chloride: 106 mEq/L (ref 96–112)
Creatinine, Ser: 0.8 mg/dL (ref 0.4–1.5)
GFR: 100 mL/min (ref >60.00)
Glucose, Bld: 121 mg/dL — ABNORMAL HIGH (ref 70–99)
Potassium: 4 mEq/L (ref 3.5–5.1)
Sodium: 139 mEq/L (ref 135–145)

## 2013-04-11 LAB — HEPATIC FUNCTION PANEL
ALT: 27 U/L (ref 0–53)
AST: 31 U/L (ref 0–37)
Albumin: 4.2 g/dL (ref 3.5–5.2)
Alkaline Phosphatase: 61 U/L (ref 39–117)
Bilirubin, Direct: 0.3 mg/dL (ref 0.0–0.3)
Total Bilirubin: 1.6 mg/dL — ABNORMAL HIGH (ref 0.3–1.2)
Total Protein: 7.5 g/dL (ref 6.0–8.3)

## 2013-04-11 LAB — CBC WITH DIFFERENTIAL/PLATELET
Basophils Absolute: 0 10*3/uL (ref 0.0–0.1)
Basophils Relative: 0.4 % (ref 0.0–3.0)
Eosinophils Absolute: 0.1 10*3/uL (ref 0.0–0.7)
Eosinophils Relative: 2.3 % (ref 0.0–5.0)
HCT: 37.3 % — ABNORMAL LOW (ref 39.0–52.0)
Hemoglobin: 12.6 g/dL — ABNORMAL LOW (ref 13.0–17.0)
Lymphocytes Relative: 24.7 % (ref 12.0–46.0)
Lymphs Abs: 1.2 10*3/uL (ref 0.7–4.0)
MCHC: 33.7 g/dL (ref 30.0–36.0)
MCV: 88.4 fl (ref 78.0–100.0)
Monocytes Absolute: 0.4 10*3/uL (ref 0.1–1.0)
Monocytes Relative: 8.8 % (ref 3.0–12.0)
Neutro Abs: 3 10*3/uL (ref 1.4–7.7)
Neutrophils Relative %: 63.8 % (ref 43.0–77.0)
Platelets: 152 10*3/uL (ref 150.0–400.0)
RBC: 4.22 Mil/uL (ref 4.22–5.81)
RDW: 13.9 % (ref 11.5–14.6)
WBC: 4.7 10*3/uL (ref 4.5–10.5)

## 2013-04-11 LAB — LIPID PANEL
Cholesterol: 111 mg/dL (ref 0–200)
HDL: 44 mg/dL (ref >39.00)
LDL Cholesterol: 58 mg/dL (ref 0–99)
Total CHOL/HDL Ratio: 3
Triglycerides: 44 mg/dL (ref 0.0–149.0)
VLDL: 8.8 mg/dL (ref 0.0–40.0)

## 2013-04-11 LAB — HEMOGLOBIN A1C: Hgb A1c MFr Bld: 5.7 % (ref 4.6–6.5)

## 2013-04-11 LAB — TSH: TSH: 1.66 u[IU]/mL (ref 0.35–5.50)

## 2013-04-11 MED ORDER — OXYCODONE-ACETAMINOPHEN 7.5-325 MG PO TABS: 1.0000 | ORAL_TABLET | Freq: Four times a day (QID) | ORAL | Status: DC | PRN

## 2013-04-11 MED ORDER — ZOSTER VACCINE LIVE 19400 UNT/0.65ML ~~LOC~~ SOLR: 0.6500 mL | Freq: Once | SUBCUTANEOUS | Status: DC

## 2013-04-11 NOTE — Assessment & Plan Note (Signed)
On statin, hx CAD Check annually, titrate as needed The current medical regimen is effective;  continue present plan and medications.

## 2013-04-11 NOTE — Progress Notes (Signed)
Pre-visit discussion using our clinic review tool. No additional management support is needed unless otherwise documented below in the visit note.  

## 2013-04-11 NOTE — Assessment & Plan Note (Signed)
BP Readings from Last 3 Encounters:  04/11/13 120/72  10/07/12 124/70  04/07/12 148/70   The current medical regimen is generally effective;  continue present plan and medications.

## 2013-04-11 NOTE — Progress Notes (Signed)
Subjective:    Patient ID: [name], male    DOB: 02-Jul-1940, 72 y.o.   MRN: [id number]  HPI   Here for medicare wellness  Diet: heart healthy or DM if diabetic Physical activity: sedentary Depression/mood screen: negative Hearing: intact to whispered voice Visual acuity: grossly normal, performs annual eye exam  ADLs: capable Fall risk: none Home safety: good Cognitive evaluation: intact to orientation, naming, recall and repetition EOL planning: adv directives, full code/ I agree  I have personally reviewed and have noted 1. The patient's medical and social history 2. Their use of alcohol, tobacco or illicit drugs 3. Their current medications and supplements 4. The patient's functional ability including ADL's, fall risks, home safety risks and hearing or visual impairment. 5. Diet and physical activities 6. Evidence for depression or mood disorders  Also reviewed chronic medical issues and interval medical events  Past Medical History  Diagnosis Date  . 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 COPD (chronic obstructive pulmonary disease)   . Cerebrovascular disease   . AAA (abdominal aortic aneurysm)   . 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 Hyperlipidemia   . HTN (hypertension)   . CONGENITAL UNSPEC REDUCTION DEFORMITY LOWER LIMB   . TOBACCO USE, QUIT    Family History  Problem Relation Age of Onset  . Cirrhosis Mother     died at 52  . Colon cancer Neg Hx   . Stomach cancer Neg Hx    History  Substance Use Topics  . Smoking status: Former Games developer  . Smokeless tobacco: Never Used  . Alcohol Use: No     Comment: History of heavy alcohol use per pt. Quit many years ago1/1/ 2005    Review of Systems  Constitutional: Negative for fever, activity change, appetite change, fatigue and unexpected weight change.  Respiratory: Negative for cough, chest tightness, shortness of breath and wheezing.   Cardiovascular: Negative  for chest pain, palpitations and leg swelling.  Neurological: Negative for dizziness, weakness and headaches.  Psychiatric/Behavioral: Negative for dysphoric mood. The patient is not nervous/anxious.   All other systems reviewed and are negative.       Objective:   Physical Exam BP 120/72  Pulse 64  Temp(Src) 97 F (36.1 C) (Oral)  Wt 220 lb (99.791 kg)  SpO2 94% Wt Readings from Last 3 Encounters:  04/11/13 220 lb (99.791 kg)  10/07/12 215 lb (97.523 kg)  04/07/12 220 lb (99.791 kg)   Constitutional: he appears well-developed and well-nourished. No distress.  HENT: Head: Normocephalic and atraumatic. Ears: B TMs ok, no erythema or effusion; Nose: Nose normal. Mouth/Throat: Oropharynx is clear and moist. No oropharyngeal exudate.  Eyes: Conjunctivae and EOM are normal. Pupils are equal, round, and reactive to light. No scleral icterus.  Neck: Normal range of motion. Neck supple. No JVD present. No thyromegaly present.  Cardiovascular: Normal rate, regular rhythm and normal heart sounds.  No murmur heard. No BLE edema. Pulmonary/Chest: Effort normal and breath sounds normal. No respiratory distress. he has no wheezes.  Abdominal: Soft. Bowel sounds are normal. he exhibits no distension. There is no tenderness. no masses Musculoskeletal: Normal range of motion, no joint effusions. No gross deformities Neurological: he is alert and oriented to person, place, and time. No cranial nerve deficit. Coordination, balance, strength, speech and gait are normal.  Skin: Skin is warm and dry. No rash noted. No erythema.  Psychiatric: he has a normal mood  and affect. behavior is normal. Judgment and thought content normal.  Lab Results  Component Value Date   WBC 6.1 12/06/2010   HGB 14.4 12/06/2010   HCT 42.8 12/06/2010   PLT 200.0 12/06/2010   GLUCOSE 123* 10/07/2012   CHOL 128 10/01/2011   TRIG 100.0 10/01/2011   HDL 46.30 10/01/2011   LDLCALC 62 10/01/2011   ALT 29 10/01/2011   AST 31  10/01/2011   NA 140 10/07/2012   K 4.2 10/07/2012   CL 106 10/07/2012   CREATININE 0.9 10/07/2012   BUN 13 10/07/2012   CO2 27 10/07/2012   TSH 1.66 12/06/2010   INR 1.5 11/03/2008   HGBA1C  Value: 6.0 (NOTE) The ADA recommends the following therapeutic goal for glycemic control related to Hgb A1c measurement: Goal of therapy: <6.5 Hgb A1c  Reference: American Diabetes Association: Clinical Practice Recommendations 2010, Diabetes Care, 2010, 33: (Suppl  1). 11/02/2008       Assessment & Plan:   AWV/v70.0 - Today patient counseled on age appropriate routine health concerns for screening and prevention, each reviewed and up to date or declined. Immunizations reviewed and up to date or declined. Labs ordered and reviewed. Risk factors for depression reviewed and negative. Hearing function and visual acuity are intact. ADLs screened and addressed as needed. Functional ability and level of safety reviewed and appropriate. Education, counseling and referrals performed based on assessed risks today. Patient provided with a copy of personalized plan for preventive services.  Also See problem list. Medications and labs reviewed today.

## 2013-04-11 NOTE — Assessment & Plan Note (Signed)
Random, mild Weight trends reviewed Check a1c now and annually - more frequent if DM dx confirmed The patient is asked to make an attempt to improve diet and exercise patterns to aid in medical management of risk of DM

## 2013-04-11 NOTE — Assessment & Plan Note (Addendum)
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

## 2013-04-11 NOTE — Patient Instructions (Addendum)
It was good to see you today.  We have reviewed your prior records including labs and tests today  Health Maintenance reviewed - all recommended immunizations and age-appropriate screenings are up-to-date.  You have been given a written prescription for the shingles shot to take to your pharmacy - you can have the shot administered by your pharmacy or bring the medicine to our office and our nurse will give you the shot.  Medications reviewed and updated - increase Percocet to 7.5 mg, max 4 pills in a day - no other changes at this time. Refill on medication(s) as discussed today.  Test(s) ordered today. Your results will be released to My Chart (or called to you) after review, usually within 72 hours after test completion. If any changes need to be made, you will be notified at that same time.  we'll make referral to back specialist to consider other treatment options for controlling your back pain symptoms. Our office will contact you regarding appointment(s) once made.  Please schedule follow up in 12 months for annual exam and labs, call sooner if problems.     Health Maintenance, Males A healthy lifestyle and preventative care can promote health and wellness.  Maintain regular health, dental, and eye exams.  Eat a healthy diet. Foods like vegetables, fruits, whole grains, low-fat dairy products, and lean protein foods contain the nutrients you need without too many calories. Decrease your intake of foods high in solid fats, added sugars, and salt. Get information about a proper diet from your caregiver, if necessary.  Regular physical exercise is one of the most important things you can do for your health. Most adults should get at least 150 minutes of moderate-intensity exercise (any activity that increases your heart rate and causes you to sweat) each week. In addition, most adults need muscle-strengthening exercises on 2 or more days a week.   Maintain a healthy weight. The body  mass index (BMI) is a screening tool to identify possible weight problems. It provides an estimate of body fat based on height and weight. Your caregiver can help determine your BMI, and can help you achieve or maintain a healthy weight. For adults 20 years and older:  A BMI below 18.5 is considered underweight.  A BMI of 18.5 to 24.9 is normal.  A BMI of 25 to 29.9 is considered overweight.  A BMI of 30 and above is considered obese.  Maintain normal blood lipids and cholesterol by exercising and minimizing your intake of saturated fat. Eat a balanced diet with plenty of fruits and vegetables. Blood tests for lipids and cholesterol should begin at age 8 and be repeated every 5 years. If your lipid or cholesterol levels are high, you are over 50, or you are a high risk for heart disease, you may need your cholesterol levels checked more frequently.Ongoing high lipid and cholesterol levels should be treated with medicines, if diet and exercise are not effective.  If you smoke, find out from your caregiver how to quit. If you do not use tobacco, do not start.  Lung cancer screening is recommended for adults aged 40 80 years who are at high risk for developing lung cancer because of a history of smoking. Yearly low-dose computed tomography (CT) is recommended for people who have at least a 30-pack-year history of smoking and are a current smoker or have quit within the past 15 years. A pack year of smoking is smoking an average of 1 pack of cigarettes a day for  1 year (for example: 1 pack a day for 30 years or 2 packs a day for 15 years). Yearly screening should continue until the smoker has stopped smoking for at least 15 years. Yearly screening should also be stopped for people who develop a health problem that would prevent them from having lung cancer treatment.  If you choose to drink alcohol, do not exceed 2 drinks per day. One drink is considered to be 12 ounces (355 mL) of beer, 5 ounces (148  mL) of wine, or 1.5 ounces (44 mL) of liquor.  Avoid use of street drugs. Do not share needles with anyone. Ask for help if you need support or instructions about stopping the use of drugs.  High blood pressure causes heart disease and increases the risk of stroke. Blood pressure should be checked at least every 1 to 2 years. Ongoing high blood pressure should be treated with medicines if weight loss and exercise are not effective.  If you are 23 to 70 years old, ask your caregiver if you should take aspirin to prevent heart disease.  Diabetes screening involves taking a blood sample to check your fasting blood sugar level. This should be done once every 3 years, after age 63, if you are within normal weight and without risk factors for diabetes. Testing should be considered at a younger age or be carried out more frequently if you are overweight and have at least 1 risk factor for diabetes.  Colorectal cancer can be detected and often prevented. Most routine colorectal cancer screening begins at the age of 59 and continues through age 85. However, your caregiver may recommend screening at an earlier age if you have risk factors for colon cancer. On a yearly basis, your caregiver may provide home test kits to check for hidden blood in the stool. Use of a small camera at the end of a tube, to directly examine the colon (sigmoidoscopy or colonoscopy), can detect the earliest forms of colorectal cancer. Talk to your caregiver about this at age 82, when routine screening begins. Direct examination of the colon should be repeated every 5 to 10 years through age 30, unless early forms of pre-cancerous polyps or small growths are found.  Hepatitis C blood testing is recommended for all people born from [birth year] through 1963 and any individual with known risks for hepatitis C.  Healthy men should no longer receive prostate-specific antigen (PSA) blood tests as part of routine cancer screening. Consult with your  caregiver about prostate cancer screening.  Testicular cancer screening is not recommended for adolescents or adult males who have no symptoms. Screening includes self-exam, caregiver exam, and other screening tests. Consult with your caregiver about any symptoms you have or any concerns you have about testicular cancer.  Practice safe sex. Use condoms and avoid high-risk sexual practices to reduce the spread of sexually transmitted infections (STIs).  Use sunscreen. Apply sunscreen liberally and repeatedly throughout the day. You should seek shade when your shadow is shorter than you. Protect yourself by wearing long sleeves, pants, a wide-brimmed hat, and sunglasses year round, whenever you are outdoors.  Notify your caregiver of new moles or changes in moles, especially if there is a change in shape or color. Also notify your caregiver if a mole is larger than the size of a pencil eraser.  A one-time screening for abdominal aortic aneurysm (AAA) and surgical repair of large AAAs by sound wave imaging (ultrasonography) is recommended for ages 56 to 54 years who are  current or former smokers.  Stay current with your immunizations. Document Released: 10/04/2007 Document Revised: 08/02/2012 Document Reviewed: 09/02/2010 [facility] Patient Information 2014 [place], [place].

## 2013-04-11 NOTE — Assessment & Plan Note (Signed)
CTA with stable 3cm infrarenal AAA on 6/2-14 scan reviewed The current medical regimen is effective;  continue present plan and medications.

## 2013-05-12 ENCOUNTER — Telehealth: Payer: Self-pay | Admitting: *Deleted

## 2013-05-12 NOTE — Telephone Encounter (Signed)
Pt called requesting Oxycodone refill.  Please advise 

## 2013-05-13 MED ORDER — OXYCODONE-ACETAMINOPHEN 7.5-325 MG PO TABS: 1.0000 | ORAL_TABLET | Freq: Four times a day (QID) | ORAL | Status: DC | PRN

## 2013-05-13 NOTE — Telephone Encounter (Signed)
Pt called again

## 2013-05-13 NOTE — Telephone Encounter (Signed)
Called the patient informed to pickup hardcopy at the front desk. 

## 2013-05-13 NOTE — Telephone Encounter (Signed)
Done hardcopy to robin  

## 2013-06-06 ENCOUNTER — Telehealth: Payer: Self-pay | Admitting: *Deleted

## 2013-06-06 MED ORDER — OXYCODONE-ACETAMINOPHEN 7.5-325 MG PO TABS: 1.0000 | ORAL_TABLET | Freq: Four times a day (QID) | ORAL | Status: DC | PRN

## 2013-06-06 NOTE — Telephone Encounter (Signed)
Phoned patient, left voicemail message notifying him that scripts are available for p/u and let him know to tell his family member that we were closing early, at 3pm today.

## 2013-06-06 NOTE — Telephone Encounter (Signed)
Patient phoned requesting refill for his percocet and states he has no means of transportation and his niece will need to p/u script.  Last OV with PCP 04/11/13 and last filled 05/13/13  CB# [phone]

## 2013-06-06 NOTE — Telephone Encounter (Signed)
rx provided - will check assured tox at next OV with me

## 2013-06-20 ENCOUNTER — Telehealth: Payer: Self-pay | Admitting: *Deleted

## 2013-06-20 NOTE — Telephone Encounter (Signed)
[facility] Triage Call Report Triage Record Num: [id number] Operator: Norva Pavlov Patient Name: [name] Call Date & Time: 06/19/2013 1:07:26PM Patient Phone: [phone] PCP: Gwendolyn Grant Patient Gender: Male PCP Fax : [phone] Patient DOB: 04/30/1940 Practice Name: [facility] Reason for Call: Caller: [place]/Patient; PCP: Gwendolyn Grant; CB#: [phone]; Call regarding Severe nosebleed, heart patient. Onset 06/19/13 at 12:30pm. States BP is normally good. No headache. Does not feel bad. Did have a sore place in his nose that he picked at on 06/18/13. Has not applied any pressure to the nose. Triaged per Nosebleed with a Single Episode of bleeding and has not applied constant pressure or ice for 10 minutes to stop the bleeding. Care Advice per Guidelines. If continues to have problems, will check with his Dr. and see if he needs to leave off his ASA 81 mg. for a few days. Will continue for now and follow call back parameters. Protocol(s) Used: Nosebleed Recommended Outcome per Protocol: Provide Home/Self Care Reason for Outcome: Single episode of bleeding AND has not applied constant direct pressure for 10 minutes by a clock Care Advice: ~ Call provider if symptoms continue, worsen, or new symptoms develop. ~ Keep the head higher than the level of the heart. Sit up or lie back a little with the head elevated. GO TO THE ED IMMEDIATELY if bleeding continues after 2 attempts of constant direct pressure to the nose for a full 5 minutes by a clock (each attempt). ~ ~ SYMPTOM / CONDITION MANAGEMENT Post Nosebleed Care: * Avoid blowing nose for 12 hours after bleeding episode. * Do not pick nose. * Do not strain or bend down to lift anything heavy for 3 days. * Use a cool mist humidifier to moisten room air. * Do not use aspirin or nonsteroidal anti-inflammatory drugs for at least one week. * Elevate head on 3 pillows when lying down for the next 3 days. *  Consider use of nonprescription nasal mucus membrane ointment or spray per label or pharmacist's instructions. ~ Nosebleed Care: - Keep person calm; agitation may increase bleeding. - Sit in an upright position and tilt head forward. - Pinch all the soft parts of the nose just below the bony portion of the nose with the thumb and side of bent index finger. - Maintain firm pressure for a full 10 minutes by a clock. - Breathe through the mouth. - If bleeding is not stopped, apply pressure for another full 10 minutes by a clock. - Apply a cloth-covered ice pack to nose and cheeks. ~ 03/

## 2013-06-30 ENCOUNTER — Telehealth: Payer: Self-pay | Admitting: *Deleted

## 2013-06-30 DIAGNOSIS — Z79899 Other long term (current) drug therapy: Secondary | ICD-10-CM | POA: Diagnosis not present

## 2013-06-30 MED ORDER — OXYCODONE-ACETAMINOPHEN 7.5-325 MG PO TABS
1.0000 | ORAL_TABLET | Freq: Four times a day (QID) | ORAL | Status: DC | PRN
Start: 2013-06-30 — End: 2013-07-28

## 2013-06-30 NOTE — Telephone Encounter (Signed)
Patient phoned again at [phone] (first time at [phone]) requesting refill on percocet.  Please advise.

## 2013-06-30 NOTE — Telephone Encounter (Signed)
Patient phoned requesting refill on percocet.  Last OV with PCP 04/11/13 and med last ordered 06/06/13.  Please advise-no contract in place (that I could find).  CB# [phone]

## 2013-06-30 NOTE — Telephone Encounter (Signed)
Printed controlled substance contract & notified patient.

## 2013-06-30 NOTE — Telephone Encounter (Signed)
Ok - note need for screening with [facility] prior to picking up refill (if not already done)  

## 2013-07-09 ENCOUNTER — Other Ambulatory Visit: Payer: Self-pay | Admitting: Internal Medicine

## 2013-07-20 DIAGNOSIS — N138 Other obstructive and reflux uropathy: Secondary | ICD-10-CM

## 2013-07-20 DIAGNOSIS — N401 Enlarged prostate with lower urinary tract symptoms: Secondary | ICD-10-CM

## 2013-07-20 HISTORY — DX: Benign prostatic hyperplasia with lower urinary tract symptoms: N40.1

## 2013-07-20 HISTORY — DX: Benign prostatic hyperplasia with lower urinary tract symptoms: N13.8

## 2013-07-27 ENCOUNTER — Encounter: Payer: Self-pay | Admitting: Internal Medicine

## 2013-07-27 ENCOUNTER — Telehealth: Payer: Self-pay | Admitting: Internal Medicine

## 2013-07-27 MED ORDER — TAMSULOSIN HCL 0.4 MG PO CAPS: 0.4000 mg | ORAL_CAPSULE | Freq: Every day | ORAL | Status: DC

## 2013-07-27 NOTE — Telephone Encounter (Signed)
If patient cannot urinate, he should go to the emergency room for treatment of this problem In the meanwhile, I have sent Flomax to his pharmacy. Take one daily Patient should also hold off on any additional oxycodone as narcotics can exacerbate this problem

## 2013-07-27 NOTE — Telephone Encounter (Signed)
Pt has been informed.  He will go to the er if worse and will have his son pick up the Flomax.

## 2013-07-27 NOTE — Telephone Encounter (Signed)
Pt can't urinate.  He is very uncomfortable.  No pain.  Feels like he needs to go, but can't.  He wants something called in.  His pharmacy is [facility] on [address].

## 2013-07-28 ENCOUNTER — Emergency Department (HOSPITAL_COMMUNITY): Payer: Medicare Other

## 2013-07-28 ENCOUNTER — Telehealth: Payer: Self-pay | Admitting: Internal Medicine

## 2013-07-28 ENCOUNTER — Inpatient Hospital Stay (HOSPITAL_COMMUNITY)
Admission: EM | Admit: 2013-07-28 | Discharge: 2013-08-02 | DRG: 948 | Disposition: A | Discharge status: Home / Self Care | Payer: Medicare Other | Attending: Internal Medicine | Admitting: Internal Medicine

## 2013-07-28 ENCOUNTER — Encounter (HOSPITAL_COMMUNITY): Payer: Self-pay | Admitting: Emergency Medicine

## 2013-07-28 DIAGNOSIS — I4891 Unspecified atrial fibrillation: Secondary | ICD-10-CM

## 2013-07-28 DIAGNOSIS — N329 Bladder disorder, unspecified: Secondary | ICD-10-CM | POA: Diagnosis not present

## 2013-07-28 DIAGNOSIS — E876 Hypokalemia: Secondary | ICD-10-CM | POA: Diagnosis present

## 2013-07-28 DIAGNOSIS — M79609 Pain in unspecified limb: Secondary | ICD-10-CM | POA: Diagnosis present

## 2013-07-28 DIAGNOSIS — D126 Benign neoplasm of colon, unspecified: Secondary | ICD-10-CM

## 2013-07-28 DIAGNOSIS — Z951 Presence of aortocoronary bypass graft: Secondary | ICD-10-CM

## 2013-07-28 DIAGNOSIS — R350 Frequency of micturition: Secondary | ICD-10-CM | POA: Diagnosis not present

## 2013-07-28 DIAGNOSIS — K8020 Calculus of gallbladder without cholecystitis without obstruction: Secondary | ICD-10-CM | POA: Diagnosis present

## 2013-07-28 DIAGNOSIS — N1330 Unspecified hydronephrosis: Secondary | ICD-10-CM | POA: Diagnosis not present

## 2013-07-28 DIAGNOSIS — T391X1A Poisoning by 4-Aminophenol derivatives, accidental (unintentional), initial encounter: Secondary | ICD-10-CM

## 2013-07-28 DIAGNOSIS — E785 Hyperlipidemia, unspecified: Secondary | ICD-10-CM

## 2013-07-28 DIAGNOSIS — R011 Cardiac murmur, unspecified: Secondary | ICD-10-CM

## 2013-07-28 DIAGNOSIS — E875 Hyperkalemia: Secondary | ICD-10-CM

## 2013-07-28 DIAGNOSIS — I441 Atrioventricular block, second degree: Secondary | ICD-10-CM | POA: Diagnosis not present

## 2013-07-28 DIAGNOSIS — R7989 Other specified abnormal findings of blood chemistry: Principal | ICD-10-CM

## 2013-07-28 DIAGNOSIS — I219 Acute myocardial infarction, unspecified: Secondary | ICD-10-CM

## 2013-07-28 DIAGNOSIS — D649 Anemia, unspecified: Secondary | ICD-10-CM

## 2013-07-28 DIAGNOSIS — I252 Old myocardial infarction: Secondary | ICD-10-CM

## 2013-07-28 DIAGNOSIS — I714 Abdominal aortic aneurysm, without rupture, unspecified: Secondary | ICD-10-CM | POA: Diagnosis not present

## 2013-07-28 DIAGNOSIS — R945 Abnormal results of liver function studies: Secondary | ICD-10-CM

## 2013-07-28 DIAGNOSIS — R310 Gross hematuria: Secondary | ICD-10-CM | POA: Diagnosis not present

## 2013-07-28 DIAGNOSIS — Z885 Allergy status to narcotic agent status: Secondary | ICD-10-CM | POA: Diagnosis not present

## 2013-07-28 DIAGNOSIS — Z79899 Other long term (current) drug therapy: Secondary | ICD-10-CM

## 2013-07-28 DIAGNOSIS — I2510 Atherosclerotic heart disease of native coronary artery without angina pectoris: Secondary | ICD-10-CM

## 2013-07-28 DIAGNOSIS — K7460 Unspecified cirrhosis of liver: Secondary | ICD-10-CM | POA: Diagnosis present

## 2013-07-28 DIAGNOSIS — Z1211 Encounter for screening for malignant neoplasm of colon: Secondary | ICD-10-CM

## 2013-07-28 DIAGNOSIS — G8929 Other chronic pain: Secondary | ICD-10-CM

## 2013-07-28 DIAGNOSIS — M549 Dorsalgia, unspecified: Secondary | ICD-10-CM

## 2013-07-28 DIAGNOSIS — G894 Chronic pain syndrome: Secondary | ICD-10-CM | POA: Diagnosis present

## 2013-07-28 DIAGNOSIS — N320 Bladder-neck obstruction: Secondary | ICD-10-CM

## 2013-07-28 DIAGNOSIS — R339 Retention of urine, unspecified: Secondary | ICD-10-CM | POA: Diagnosis not present

## 2013-07-28 DIAGNOSIS — I679 Cerebrovascular disease, unspecified: Secondary | ICD-10-CM | POA: Diagnosis present

## 2013-07-28 DIAGNOSIS — J4489 Other specified chronic obstructive pulmonary disease: Secondary | ICD-10-CM

## 2013-07-28 DIAGNOSIS — J449 Chronic obstructive pulmonary disease, unspecified: Secondary | ICD-10-CM | POA: Diagnosis present

## 2013-07-28 DIAGNOSIS — Q7290 Unspecified reduction defect of unspecified lower limb: Secondary | ICD-10-CM

## 2013-07-28 DIAGNOSIS — Z87891 Personal history of nicotine dependence: Secondary | ICD-10-CM

## 2013-07-28 DIAGNOSIS — R739 Hyperglycemia, unspecified: Secondary | ICD-10-CM

## 2013-07-28 DIAGNOSIS — I509 Heart failure, unspecified: Secondary | ICD-10-CM | POA: Diagnosis not present

## 2013-07-28 DIAGNOSIS — I10 Essential (primary) hypertension: Secondary | ICD-10-CM | POA: Diagnosis not present

## 2013-07-28 DIAGNOSIS — I6529 Occlusion and stenosis of unspecified carotid artery: Secondary | ICD-10-CM

## 2013-07-28 DIAGNOSIS — R892 Abnormal level of other drugs, medicaments and biological substances in specimens from other organs, systems and tissues: Secondary | ICD-10-CM | POA: Diagnosis not present

## 2013-07-28 HISTORY — DX: Heart failure, unspecified: I50.9

## 2013-07-28 LAB — URINALYSIS, ROUTINE W REFLEX MICROSCOPIC
Glucose, UA: NEGATIVE mg/dL
Ketones, ur: 15 mg/dL — AB
Nitrite: NEGATIVE
Protein, ur: 100 mg/dL — AB
Specific Gravity, Urine: 1.024 (ref 1.005–1.030)
Urobilinogen, UA: 1 mg/dL (ref 0.0–1.0)
pH: 5.5 (ref 5.0–8.0)

## 2013-07-28 LAB — CBC
HCT: 39.3 % (ref 39.0–52.0)
Hemoglobin: 13.5 g/dL (ref 13.0–17.0)
MCH: 31.1 pg (ref 26.0–34.0)
MCHC: 34.4 g/dL (ref 30.0–36.0)
MCV: 90.6 fL (ref 78.0–100.0)
Platelets: 165 10*3/uL (ref 150–400)
RBC: 4.34 MIL/uL (ref 4.22–5.81)
RDW: 14.3 % (ref 11.5–15.5)
WBC: 5 10*3/uL (ref 4.0–10.5)

## 2013-07-28 LAB — COMPREHENSIVE METABOLIC PANEL
ALT: 224 U/L — ABNORMAL HIGH (ref 0–53)
AST: 192 U/L — ABNORMAL HIGH (ref 0–37)
Albumin: 4.3 g/dL (ref 3.5–5.2)
Alkaline Phosphatase: 196 U/L — ABNORMAL HIGH (ref 39–117)
BUN: 13 mg/dL (ref 6–23)
CO2: 21 mEq/L (ref 19–32)
Calcium: 9.3 mg/dL (ref 8.4–10.5)
Chloride: 99 mEq/L (ref 96–112)
Creatinine, Ser: 0.77 mg/dL (ref 0.50–1.35)
GFR calc Af Amer: >90 mL/min (ref >90)
GFR calc non Af Amer: 90 mL/min — ABNORMAL LOW (ref >90)
Glucose, Bld: 102 mg/dL — ABNORMAL HIGH (ref 70–99)
Potassium: 3.4 mEq/L — ABNORMAL LOW (ref 3.7–5.3)
Sodium: 140 mEq/L (ref 137–147)
Total Bilirubin: 3.4 mg/dL — ABNORMAL HIGH (ref 0.3–1.2)
Total Protein: 8.1 g/dL (ref 6.0–8.3)

## 2013-07-28 LAB — URINE MICROSCOPIC-ADD ON

## 2013-07-28 LAB — ACETAMINOPHEN LEVEL: Acetaminophen (Tylenol), Serum: <15 ug/mL (ref 10–30)

## 2013-07-28 LAB — POC OCCULT BLOOD, ED: Fecal Occult Bld: POSITIVE — AB

## 2013-07-28 LAB — PROTIME-INR
INR: 1.07 (ref 0.00–1.49)
Prothrombin Time: 13.7 seconds (ref 11.6–15.2)

## 2013-07-28 LAB — LIPASE, BLOOD: Lipase: 26 U/L (ref 11–59)

## 2013-07-28 MED ORDER — ACETYLCYSTEINE 20 % IN SOLN: 140.0000 mg/kg | Freq: Once | RESPIRATORY_TRACT | Status: DC

## 2013-07-28 MED ORDER — FENTANYL CITRATE 0.05 MG/ML IJ SOLN
50.0000 ug | Freq: Once | INTRAMUSCULAR | Status: AC
  Administered 2013-07-28: 50 ug via INTRAVENOUS
  Filled 2013-07-28: qty 2

## 2013-07-28 MED ORDER — ACETYLCYSTEINE 20 % IN SOLN
140.0000 mg/kg | Freq: Once | RESPIRATORY_TRACT | Status: AC
  Administered 2013-07-28: 13980 mg via ORAL
  Filled 2013-07-28: qty 90

## 2013-07-28 MED ORDER — ACETYLCYSTEINE 20 % IN SOLN: 70.0000 mg/kg | RESPIRATORY_TRACT | Status: DC

## 2013-07-28 MED ORDER — IOHEXOL 300 MG/ML  SOLN
25.0000 mL | Freq: Once | INTRAMUSCULAR | Status: AC | PRN
  Administered 2013-07-28: 25 mL via ORAL

## 2013-07-28 MED ORDER — IOHEXOL 300 MG/ML  SOLN
100.0000 mL | Freq: Once | INTRAMUSCULAR | Status: AC | PRN
  Administered 2013-07-28: 100 mL via INTRAVENOUS

## 2013-07-28 MED ORDER — ACETYLCYSTEINE 20 % IN SOLN
70.0000 mg/kg | RESPIRATORY_TRACT | Status: DC
  Administered 2013-07-29 (×5): 6980 mg via ORAL
  Filled 2013-07-28 (×12): qty 40

## 2013-07-28 NOTE — ED Notes (Signed)
MD at bedside. 

## 2013-07-28 NOTE — ED Notes (Signed)
MD notified of unsuccessful attempt for foley.

## 2013-07-28 NOTE — Telephone Encounter (Signed)
Patient Information:  Caller Name: [place]  Phone: [phone]  Patient: [name], [name]  Gender: Male  DOB: 1940-10-11  Age: 73 Years  PCP: Gwendolyn Grant (Adults only)  Office Follow Up:  Does the office need to follow up with this patient?: No  Instructions For The Office: N/A   Symptoms  Reason For Call & Symptoms: Dr. Otelia Limes called in Flomax for pt 07/27/13 and if not better to go to the ER.  He still has a trickle  Of urine and can he get an appt or does he need to go to the ED?  Pt states he knows he will probably need a catheter.    Dr. Asa Lente states in EPIc note for 07/27/13 pt should go to the ED if he cannot urinate for "treatment  of this problem".   Pt states he will go to [facility] for evaluation and treatment.  Reviewed Health History In EMR: Yes  Reviewed Medications In EMR: Yes  Reviewed Allergies In EMR: Yes  Reviewed Surgeries / Procedures: N/A  Date of Onset of Symptoms: 07/27/2013  Treatments Tried: Flomax  Treatments Tried Worked: No  Guideline(s) Used:  No Protocol Available - Information Only  Disposition Per Guideline:   Discuss with PCP and Callback by Nurse Today  Reason For Disposition Reached:   Nursing judgment  Advice Given:  N/A  Patient Will Follow Care Advice:  YES

## 2013-07-28 NOTE — ED Provider Notes (Signed)
CSN: [id number]     Arrival date & time 07/28/13  1344 History   First MD Initiated Contact with Patient 07/28/13 1613     Chief Complaint  Patient presents with  . Urinary Retention  . Constipation     (Consider location/radiation/quality/duration/timing/severity/associated sxs/prior Treatment) HPI Comments: Patient presents with a two-day history of difficulty with urination. He states he only dribbles. His Dr. Georgena Spurling him on Flomax without relief. He endorses having diarrhea for the past several days. His last normal bowel movement was 2 days ago. No vomiting. No chest pain or shortness of breath. His abdomen feels more distended than usual. He denies any pain however. Good by mouth intake. Denies any testicular pain. Denies any blood in his stool or blood in his urine. No previous history of prostate problems.  The history is provided by the patient.    Past Medical History  Diagnosis Date  . 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 COPD (chronic obstructive pulmonary disease)   . Cerebrovascular disease   . AAA (abdominal aortic aneurysm)   . 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 Hyperlipidemia   . HTN (hypertension)   . CONGENITAL UNSPEC REDUCTION DEFORMITY LOWER LIMB   . TOBACCO USE, QUIT   . CHF (congestive heart failure)    Past Surgical History  Procedure Laterality Date  . Coronary artery bypass graft  11/03/2008    Ricard Dillon - x4: left internal mammary artery to the distal left anterior descending, saphemous vein graft to the first circumflex marginal branch with a Y graft sequentiallly to a left posterolateral branch, saphenous vein graft to the distal right coronary artery   Family History  Problem Relation Age of Onset  . Cirrhosis Mother     died at 83  . Colon cancer Neg Hx   . Stomach cancer Neg Hx    History  Substance Use Topics  . Smoking status: Former Research scientist (life sciences)  . Smokeless tobacco: Never Used  . Alcohol Use: No     Comment:  History of heavy alcohol use per pt. Quit many years ago1/1/ 2005    Review of Systems  Constitutional: Negative for fever, activity change and appetite change.  HENT: Negative for congestion and rhinorrhea.   Eyes: Negative for visual disturbance.  Respiratory: Negative for cough, chest tightness and shortness of breath.   Cardiovascular: Negative for chest pain.  Gastrointestinal: Positive for abdominal pain. Negative for nausea and vomiting.  Genitourinary: Positive for decreased urine volume and difficulty urinating. Negative for dysuria, hematuria and testicular pain.  Musculoskeletal: Negative for arthralgias, back pain and myalgias.  Skin: Negative for rash.  Neurological: Negative for weakness and headaches.  A complete 10 system review of systems was obtained and all systems are negative except as noted in the HPI and PMH.      Allergies  Hydrocodone  Home Medications   No current outpatient prescriptions on file. BP 162/72  Pulse 68  Temp(Src) 97.9 F (36.6 C) (Oral)  Resp 18  Ht 6' (1.829 m)  Wt 214 lb 4.6 oz (97.2 kg)  BMI 29.06 kg/m2  SpO2 97% Physical Exam  Constitutional: He is oriented to person, place, and time. He appears well-developed and well-nourished.  HENT:  Head: Normocephalic and atraumatic.  Mouth/Throat: Oropharynx is clear and moist. No oropharyngeal exudate.  Eyes: Conjunctivae and EOM are normal. Pupils are equal, round, and reactive to light.  Neck: Normal range of motion. Neck supple.  Cardiovascular: Normal rate, regular rhythm and normal heart sounds.   No murmur heard. Pulmonary/Chest: Effort normal and breath sounds normal. No respiratory distress.  Abdominal: Soft. He exhibits distension. There is tenderness. There is no rebound and no guarding.  Distended abdomen, ecchymosis to right side. Patient uncertain how this got there and didn't know it was there. Diffuse tenderness without guarding or rebound  Genitourinary: No penile  tenderness.  No testicular tenderness No fecal impaction  Musculoskeletal: Normal range of motion. He exhibits no edema and no tenderness.  5/5 strength in bilateral lower extremities. Ankle plantar and dorsiflexion intact. Great toe extension intact bilaterally. +2 DP and PT pulses. +2 patellar reflexes bilaterally. Normal gait.   Neurological: He is alert and oriented to person, place, and time. No cranial nerve deficit. He exhibits normal muscle tone. Coordination normal.  Skin: Skin is warm.    ED Course  Procedures (including critical care time) Labs Review Labs Reviewed  URINALYSIS, ROUTINE W REFLEX MICROSCOPIC - Abnormal; Notable for the following:    Color, Urine RED (*)    APPearance CLOUDY (*)    Hgb urine dipstick LARGE (*)    Bilirubin Urine MODERATE (*)    Ketones, ur 15 (*)    Protein, ur 100 (*)    Leukocytes, UA SMALL (*)    All other components within normal limits  COMPREHENSIVE METABOLIC PANEL - Abnormal; Notable for the following:    Potassium 3.4 (*)    Glucose, Bld 102 (*)    AST 192 (*)    ALT 224 (*)    Alkaline Phosphatase 196 (*)    Total Bilirubin 3.4 (*)    GFR calc non Af Amer 90 (*)    All other components within normal limits  POC OCCULT BLOOD, ED - Abnormal; Notable for the following:    Fecal Occult Bld POSITIVE (*)    All other components within normal limits  CBC  PROTIME-INR  LIPASE, BLOOD  ACETAMINOPHEN LEVEL  URINE MICROSCOPIC-ADD ON  COMPREHENSIVE METABOLIC PANEL  PROTIME-INR  HEPATIC FUNCTION PANEL  PROTIME-INR  BASIC METABOLIC PANEL  CBC  HEPATITIS PANEL, ACUTE   Imaging Review Ct Abdomen Pelvis W Contrast  07/28/2013   CLINICAL DATA:  Unable to urinate. Abdominal discomfort and constipation.  EXAM: CT ABDOMEN AND PELVIS WITH CONTRAST  TECHNIQUE: Multidetector CT imaging of the abdomen and pelvis was performed using the standard protocol following bolus administration of intravenous contrast.  CONTRAST:  152mL OMNIPAQUE IOHEXOL  300 MG/ML  SOLN  COMPARISON:  CT ANGIO ABDOMEN W/CM &/OR WO/CM dated 10/28/2012; CT ANGIO ABDOMEN W/CM &/OR WO/CM dated 10/10/2011  FINDINGS: Lung bases show no acute findings. Heart is at the upper limits of normal in size. No pericardial or pleural effusion.  Liver margin is markedly irregular. Small stones layer in the gallbladder. Adrenal glands are unremarkable. Mild bilateral hydronephrosis. Bladder is distended and contains calcific density along the posterior wall. Foley catheter is seen within the bladder. No definite urinary stones.  Spleen, pancreas, stomach and bowel are unremarkable. Prostate is enlarged. No pathologically enlarged lymph nodes. No free fluid. Atherosclerotic calcification of the arterial vasculature. Infrarenal aorta measures up to 3.1 cm above the bifurcation. No worrisome lytic or sclerotic lesions. Advanced degenerative changes are seen in the spine.  IMPRESSION: 1. Mild bilateral hydronephrosis is likely secondary to marked bladder distention, despite indwelling Foley catheter. 2. Calcification along the posterior wall of the bladder. Transitional cell carcinoma cannot be excluded. 3. Prostate enlargement. 4. Advanced cirrhosis. 5.  Small infrarenal aortic aneurysm.   Electronically Signed   By: Lorin Picket M.D.   On: 07/28/2013 19:22   US Abdomen Limited Ruq  07/28/2013   CLINICAL DATA:  Elevated LFTs.  EXAM: US ABDOMEN LIMITED - RIGHT UPPER QUADRANT  COMPARISON:  CT 10/10/2011  FINDINGS: Gallbladder:  There is evidence of mild cholelithiasis. Gallbladder wall thickening is present measuring 4.6 mm. Negative sonographic Murphy's sign. No adjacent free fluid.  Common bile duct:  Diameter: 4.8 mm proximally and 5 mm distally.  Liver:  Somewhat small with nodular contour and coarsened echotexture compatible with cirrhosis. No definite focal mass.  IMPRESSION: Mild cholelithiasis with associated gallbladder wall thickening. No ductal dilatation. Negative sonographic Murphy sign.  Recommend clinical correlation for acute cholecystitis.  Evidence of cirrhosis.   Electronically Signed   By: Marin Olp M.D.   On: 07/28/2013 18:52     EKG Interpretation None      MDM   Final diagnoses:  Urinary retention  Elevated LFTs  Acetaminophen toxicity   Distended abdomen with difficulty with urination for the past 2 days. No fecal impaction. Suspect urinary retention.  Difficulty placing Foley catheter. Coude catheter was placed. No urine output despite balloon being in the bladder on ultrasound. No urine output even after flushing. Discussed with Dr. Karsten Ro urology who reviewed the scan images.  He does not think patient has transitional cell carcinoma but will need cystoscopy at some point. Catheter now working after repositioning with greater than 1000 cc of bloody urine.  LFT elevation noted. Patient denies any alcohol use. Ultrasound shows mild cholelithiasis with gallbladder wall thickening. No right upper quadrant tenderness. He does think he been taking too much Tylenol at home. He estimates maybe 8-10 extra strength tablets daily.  Will treat with mucomyst for possible chronic APAP overdose.  D/w pharmacy and poison control.  Will need urology follow up for urinary retention and bladder abnormality.  Ezequiel Essex, MD 07/29/13 [phone]

## 2013-07-28 NOTE — Progress Notes (Signed)
Received pt report from [place].

## 2013-07-28 NOTE — ED Notes (Signed)
Pt reports hes had constipation for several days and then yesterday he started to have trouble urinating. His doctor called in some flomax for him but states "i can only get a few dribbles out." he says hes not in pain but he is "just uncomfortable."

## 2013-07-28 NOTE — Progress Notes (Signed)
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 Hydrocodone     REACTION: Rash    Patient Measurements: Height: 6' (182.9 cm) Weight: 220 lb (99.791 kg) IBW/kg (Calculated) : 77.6  Vital Signs: Temp: 97.9 F (36.6 C) (04/09 1617) Temp src: Oral (04/09 1617) BP: 127/52 mmHg (04/09 2130) Pulse Rate: 71 (04/09 2130) Intake/Output from previous day:   Intake/Output from this shift: Total I/O In: -  Out: 750 [Urine:750]  Labs:  Recent Labs  07/28/13 1603  WBC 5.0  HGB 13.5  HCT 39.3  PLT 165  CREATININE 0.77  ALBUMIN 4.3  PROT 8.1  AST 192*  ALT 224*  ALKPHOS 196*  BILITOT 3.4*   Estimated Creatinine Clearance: 105.1 ml/min (by C-G formula based on Cr of 0.77).   Microbiology: No results found for this or any previous visit (from the past 720 hour(s)).  Medical History: Past Medical History  Diagnosis Date  . 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 COPD (chronic obstructive pulmonary disease)   . Cerebrovascular disease   . AAA (abdominal aortic aneurysm)   . 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 Hyperlipidemia   . HTN (hypertension)   . CONGENITAL UNSPEC REDUCTION DEFORMITY LOWER LIMB   . TOBACCO USE, QUIT   . CHF (congestive heart failure)     Medications:  See electronic med rec  Assessment: 73 y.o. male presents with urinary retention and constipation. Pt with elevated LFTs. Denies any ETOH use. Questionable chronic APAP overdose. Pt states that he thinks he has been taking too much Tylenol (not sure exactly how much he has been taking but more than 513m q6h) - when I asked him, he thinks maybe 8-10 of the extra strength daily. APAP level < 15 upon admit. Alk phos 196, AST 192, ALT 224, INR 1.07  MD ordered po acetylcysteine load 1487mkg stat - given at 2040. RN called poison control center 4/9 2030  and they faxed over standard recommendations. [facility] are aware of patient and recommend po protocol with recheck of labs at 22 hours. Pt tolerated initial po acetylcysteine load without vomiting.  Goal of Therapy:  Prevention of APAP toxicity Patients who meet ALL the following criteria are typically eligible to discontinue NAC therapy after [facility]. Minimum of 6 doses of oral NAC or 24 hours of IV NAC have been administered b. Patient is asymptomatic c. AST and ALT normal d. Serum bicarbonate > 20 or pH > 7.25 e. INR < 2 (if required) f. Acetaminophen level < 10 mcg/mL (if required)  Plan:  1) Acetylcysteine po 14055mg load already given 2) Acetylcysteine po 77m2m q4h  3) Will f/u LFTs. No need for repeat APAP level since initial one is negative. 4) F/u with poison control center at 22 hrs to see if ok to stop po acetylcysteine  CaroSherlon HandingarmD, BCPS Clinical pharmacist, pager [phone]/2015,10:10 PM

## 2013-07-28 NOTE — H&P (Signed)
[facility] History and Physical  [name] EVO:[id number] DOB: [birth year] DOA: 07/28/2013  Referring physician: ER physician. PCP: Brian Grant, MD   Chief Complaint: Urinary obstruction.  HPI: [name] is a 73 y.o. male with history of hypertension, CAD, COPD, hyperlipidemia, hyperglycemia presented to the ER because of difficulty urinating for last 2 days. In the ER patient was found to have distended bladder and had a Foley catheter placed. CT abdomen and pelvis done showed bilateral hydronephrosis. On-call urologist Dr. Noralee Chars was consulted by the ER physician and at this time they have requested outpatient followup. Patient's LFTs are found to be elevated and patient states around recently he has been taking increasing Tylenol as he ran out of Percocet last 2-3 days. Tylenol levels were undetectable but since patient's LFTs were acutely increased patient was started on Mucomyst. Poison control has recommended to continue the full dose. In addition CT abdomen and pelvis done shows features concerning for cirrhosis of liver and sonogram of abdomen done shows gallstones with no features of acute cholecystitis. On exam patient does not have any abdominal tenderness. Patient did have diarrhea for last 2 days. Denies any nausea vomiting chest pain or shortness of breath.   Review of Systems: As presented in the history of presenting illness, rest negative.  Past Medical History  Diagnosis Date  . 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 COPD (chronic obstructive pulmonary disease)   . Cerebrovascular disease   . AAA (abdominal aortic aneurysm)   . 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 Hyperlipidemia   . HTN (hypertension)   . CONGENITAL UNSPEC REDUCTION DEFORMITY LOWER LIMB   . TOBACCO USE, QUIT   . CHF (congestive heart failure)    Past Surgical History  Procedure Laterality Date  . Coronary artery bypass graft  11/03/2008    Ricard Dillon -  x4: left internal mammary artery to the distal left anterior descending, saphemous vein graft to the first circumflex marginal branch with a Y graft sequentiallly to a left posterolateral branch, saphenous vein graft to the distal right coronary artery   Social History:  reports that he has quit smoking. He has never used smokeless tobacco. He reports that he does not drink alcohol or use illicit drugs. Where does patient live home. Can patient participate in ADLs? Yes.  Allergies  Allergen Reactions  . Hydrocodone     REACTION: Rash    Family History:  Family History  Problem Relation Age of Onset  . Cirrhosis Mother     died at 88  . Colon cancer Neg Hx   . Stomach cancer Neg Hx       Prior to Admission medications   Medication Sig Start Date End Date Taking? Authorizing Provider  acetaminophen (TYLENOL) 500 MG tablet Take 500 mg by mouth every 6 (six) hours as needed. For pain   Yes Historical Provider, MD  albuterol (PROVENTIL HFA;VENTOLIN HFA) 108 (90 BASE) MCG/ACT inhaler Inhale into the lungs every 6 (six) hours as needed for wheezing or shortness of breath.   Yes Historical Provider, MD  amLODipine (NORVASC) 10 MG tablet Take 10 mg by mouth daily.   Yes Historical Provider, MD  aspirin 81 MG tablet Take 81 mg by mouth daily.     Yes Historical Provider, MD  atorvastatin (LIPITOR) 40 MG tablet Take 40 mg by mouth daily.   Yes Historical Provider, MD  buPROPion (WELLBUTRIN XL) 150 MG 24 hr  tablet Take 150 mg by mouth daily.   Yes Historical Provider, MD  diphenhydrAMINE (BENADRYL) 25 mg capsule Take 25 mg by mouth every 6 (six) hours as needed. For itch/rash   Yes Historical Provider, MD  docusate sodium (STOOL SOFTENER) 100 MG capsule Take 100 mg by mouth 2 (two) times daily.     Yes Historical Provider, MD  furosemide (LASIX) 40 MG tablet Take 40 mg by mouth daily.   Yes Historical Provider, MD  guaiFENesin (MUCINEX) 600 MG 12 hr tablet Take 1,200 mg by mouth 2 (two) times  daily.     Yes Historical Provider, MD  metoprolol tartrate (LOPRESSOR) 25 MG tablet Take 12.5 mg by mouth 2 (two) times daily.   Yes Historical Provider, MD  mometasone (NASONEX) 50 MCG/ACT nasal spray Place 2 sprays into the nose daily.   Yes Historical Provider, MD  niacin 500 MG tablet Take 1,000 mg by mouth at bedtime.   Yes Historical Provider, MD  potassium chloride SA (K-DUR,KLOR-CON) 20 MEQ tablet Take 20 mEq by mouth 2 (two) times daily.   Yes Historical Provider, MD  Sennosides 25 MG TABS Take 25 mg by mouth daily.    Yes Historical Provider, MD  tamsulosin (FLOMAX) 0.4 MG CAPS capsule Take 1 capsule (0.4 mg total) by mouth daily after supper. 07/27/13  Yes Rowe Clack, MD    Physical Exam: Filed Vitals:   07/28/13 2015 07/28/13 2030 07/28/13 2045 07/28/13 2115  BP: 137/74 120/59 136/59 141/52  Pulse: 67 84 71 70  Temp:      TempSrc:      Resp:      Height:      Weight:      SpO2: 97% 93% 96% 99%     General:  Well-developed and nourished.  Eyes: icteric no pallor.  ENT: No discharge from the ears eyes nose mouth.  Neck: No mass felt.  Cardiovascular: S1-S2 heard.  Respiratory: No rhonchi or crepitations.  Abdomen: Soft distended nontender bowel sounds present.  Skin: No rash.  Musculoskeletal: No edema.  Psychiatric: Appears normal.  Neurologic: Alert awake oriented to time place and person. Moves all extremities.  Labs on Admission:  Basic Metabolic Panel:  Recent Labs Lab 07/28/13 1603  NA 140  K 3.4*  CL 99  CO2 21  GLUCOSE 102*  BUN 13  CREATININE 0.77  CALCIUM 9.3   Liver Function Tests:  Recent Labs Lab 07/28/13 1603  AST 192*  ALT 224*  ALKPHOS 196*  BILITOT 3.4*  PROT 8.1  ALBUMIN 4.3    Recent Labs Lab 07/28/13 1643  LIPASE 26   No results found for this basename: AMMONIA,  in the last 168 hours CBC:  Recent Labs Lab 07/28/13 1603  WBC 5.0  HGB 13.5  HCT 39.3  MCV 90.6  PLT 165   Cardiac Enzymes: No  results found for this basename: CKTOTAL, CKMB, CKMBINDEX, TROPONINI,  in the last 168 hours  BNP (last 3 results) No results found for this basename: PROBNP,  in the last 8760 hours CBG: No results found for this basename: GLUCAP,  in the last 168 hours  Radiological Exams on Admission: Ct Abdomen Pelvis W Contrast  07/28/2013   CLINICAL DATA:  Unable to urinate. Abdominal discomfort and constipation.  EXAM: CT ABDOMEN AND PELVIS WITH CONTRAST  TECHNIQUE: Multidetector CT imaging of the abdomen and pelvis was performed using the standard protocol following bolus administration of intravenous contrast.  CONTRAST:  133mL OMNIPAQUE IOHEXOL 300 MG/ML  SOLN  COMPARISON:  CT ANGIO ABDOMEN W/CM &/OR WO/CM dated 10/28/2012; CT ANGIO ABDOMEN W/CM &/OR WO/CM dated 10/10/2011  FINDINGS: Lung bases show no acute findings. Heart is at the upper limits of normal in size. No pericardial or pleural effusion.  Liver margin is markedly irregular. Small stones layer in the gallbladder. Adrenal glands are unremarkable. Mild bilateral hydronephrosis. Bladder is distended and contains calcific density along the posterior wall. Foley catheter is seen within the bladder. No definite urinary stones.  Spleen, pancreas, stomach and bowel are unremarkable. Prostate is enlarged. No pathologically enlarged lymph nodes. No free fluid. Atherosclerotic calcification of the arterial vasculature. Infrarenal aorta measures up to 3.1 cm above the bifurcation. No worrisome lytic or sclerotic lesions. Advanced degenerative changes are seen in the spine.  IMPRESSION: 1. Mild bilateral hydronephrosis is likely secondary to marked bladder distention, despite indwelling Foley catheter. 2. Calcification along the posterior wall of the bladder. Transitional cell carcinoma cannot be excluded. 3. Prostate enlargement. 4. Advanced cirrhosis. 5. Small infrarenal aortic aneurysm.   Electronically Signed   By: Lorin Picket M.D.   On: 07/28/2013 19:22    US Abdomen Limited Ruq  07/28/2013   CLINICAL DATA:  Elevated LFTs.  EXAM: US ABDOMEN LIMITED - RIGHT UPPER QUADRANT  COMPARISON:  CT 10/10/2011  FINDINGS: Gallbladder:  There is evidence of mild cholelithiasis. Gallbladder wall thickening is present measuring 4.6 mm. Negative sonographic Murphy's sign. No adjacent free fluid.  Common bile duct:  Diameter: 4.8 mm proximally and 5 mm distally.  Liver:  Somewhat small with nodular contour and coarsened echotexture compatible with cirrhosis. No definite focal mass.  IMPRESSION: Mild cholelithiasis with associated gallbladder wall thickening. No ductal dilatation. Negative sonographic Murphy sign. Recommend clinical correlation for acute cholecystitis.  Evidence of cirrhosis.   Electronically Signed   By: Marin Olp M.D.   On: 07/28/2013 18:52     Assessment/Plan Principal Problem:   Elevated LFTs Active Problems:   HYPERLIPIDEMIA   HYPERTENSION   Bladder outlet obstruction   1. Elevated LFTs with possible newly diagnosed cirrhosis of liver - at this time patient is empirically treated with Mucomyst for possible Tylenol induced liver injury. Patient states he has not taken any new medications recently. Check acute hepatitis panel. Hold statins for now. Patient's INR is normal. Closely follow LFTs and INR. CT abdomen and pelvis does not show any obstructive pattern. Check direct and indirect bilirubin. Mucomyst dosing will be done by pharmacy. Patient will need eventual followup with gastroenterologist for possible cirrhosis. Have ordered HIDA scan to check for any features concerning for cholecystitis given gallstones. Patient states that he used to drink a lot of alcohol before but quit 10 years ago. 2. Obstructive uropathy - continue Foley catheter drainage. CT abdomen and pelvis also shows concerning features for transitional bladder cancer. Patient will need followup with urologist as outpatient with Foley catheter. 3. Hypertension - continue  home medications. 4. CAD - denies any chest pain. Patient is on aspirin. Patient had difficult Foley catheter placement and if patient continues to bleed we need to hold aspirin. 5. Hyperlipidemia - statins and hold due to elevated LFTs. 6. History of hyperglycemia.    Code Status: Full code.  Family Communication: None.  Disposition Plan: Admit to inpatient.    [facility] Pager [phone].  If 7PM-7AM, please contact night-coverage [email or web address] Password [facility] 07/28/2013, 9:48 PM

## 2013-07-28 NOTE — ED Notes (Signed)
His PCP called in Flomax. "It helped a little but still trickling."

## 2013-07-28 NOTE — ED Notes (Signed)
Flushed Coude Cath x 2. EDP notified.

## 2013-07-29 ENCOUNTER — Inpatient Hospital Stay (HOSPITAL_COMMUNITY): Payer: Medicare Other

## 2013-07-29 DIAGNOSIS — R7989 Other specified abnormal findings of blood chemistry: Secondary | ICD-10-CM | POA: Diagnosis not present

## 2013-07-29 DIAGNOSIS — R339 Retention of urine, unspecified: Secondary | ICD-10-CM | POA: Diagnosis not present

## 2013-07-29 DIAGNOSIS — I714 Abdominal aortic aneurysm, without rupture, unspecified: Secondary | ICD-10-CM

## 2013-07-29 DIAGNOSIS — I10 Essential (primary) hypertension: Secondary | ICD-10-CM | POA: Diagnosis not present

## 2013-07-29 DIAGNOSIS — K8020 Calculus of gallbladder without cholecystitis without obstruction: Secondary | ICD-10-CM | POA: Diagnosis not present

## 2013-07-29 LAB — BASIC METABOLIC PANEL
BUN: 15 mg/dL (ref 6–23)
CO2: 18 mEq/L — ABNORMAL LOW (ref 19–32)
Calcium: 9 mg/dL (ref 8.4–10.5)
Chloride: 99 mEq/L (ref 96–112)
Creatinine, Ser: 0.74 mg/dL (ref 0.50–1.35)
GFR calc Af Amer: >90 mL/min (ref >90)
GFR calc non Af Amer: >90 mL/min (ref >90)
Glucose, Bld: 129 mg/dL — ABNORMAL HIGH (ref 70–99)
Potassium: 2.9 mEq/L — CL (ref 3.7–5.3)
Sodium: 140 mEq/L (ref 137–147)

## 2013-07-29 LAB — HEPATIC FUNCTION PANEL
ALT: 184 U/L — ABNORMAL HIGH (ref 0–53)
AST: 136 U/L — ABNORMAL HIGH (ref 0–37)
Albumin: 3.9 g/dL (ref 3.5–5.2)
Alkaline Phosphatase: 174 U/L — ABNORMAL HIGH (ref 39–117)
Bilirubin, Direct: 1 mg/dL — ABNORMAL HIGH (ref 0.0–0.3)
Indirect Bilirubin: 2.5 mg/dL — ABNORMAL HIGH (ref 0.3–0.9)
Total Bilirubin: 3.5 mg/dL — ABNORMAL HIGH (ref 0.3–1.2)
Total Protein: 7.4 g/dL (ref 6.0–8.3)

## 2013-07-29 LAB — COMPREHENSIVE METABOLIC PANEL
ALT: 160 U/L — ABNORMAL HIGH (ref 0–53)
AST: 107 U/L — ABNORMAL HIGH (ref 0–37)
Albumin: 3.6 g/dL (ref 3.5–5.2)
Alkaline Phosphatase: 150 U/L — ABNORMAL HIGH (ref 39–117)
BUN: 12 mg/dL (ref 6–23)
CO2: 21 mEq/L (ref 19–32)
Calcium: 8.6 mg/dL (ref 8.4–10.5)
Chloride: 100 mEq/L (ref 96–112)
Creatinine, Ser: 0.69 mg/dL (ref 0.50–1.35)
GFR calc Af Amer: >90 mL/min (ref >90)
GFR calc non Af Amer: >90 mL/min (ref >90)
Glucose, Bld: 128 mg/dL — ABNORMAL HIGH (ref 70–99)
Potassium: 3 mEq/L — ABNORMAL LOW (ref 3.7–5.3)
Sodium: 140 mEq/L (ref 137–147)
Total Bilirubin: 2.5 mg/dL — ABNORMAL HIGH (ref 0.3–1.2)
Total Protein: 7.2 g/dL (ref 6.0–8.3)

## 2013-07-29 LAB — PROTIME-INR
INR: 1.21 (ref 0.00–1.49)
INR: 1.26 (ref 0.00–1.49)
Prothrombin Time: 15 seconds (ref 11.6–15.2)
Prothrombin Time: 15.5 seconds — ABNORMAL HIGH (ref 11.6–15.2)

## 2013-07-29 LAB — CBC
HCT: 37.6 % — ABNORMAL LOW (ref 39.0–52.0)
Hemoglobin: 12.8 g/dL — ABNORMAL LOW (ref 13.0–17.0)
MCH: 30.3 pg (ref 26.0–34.0)
MCHC: 34 g/dL (ref 30.0–36.0)
MCV: 88.9 fL (ref 78.0–100.0)
Platelets: 167 10*3/uL (ref 150–400)
RBC: 4.23 MIL/uL (ref 4.22–5.81)
RDW: 14.1 % (ref 11.5–15.5)
WBC: 8.9 10*3/uL (ref 4.0–10.5)

## 2013-07-29 LAB — SALICYLATE LEVEL: Salicylate Lvl: <2 mg/dL — ABNORMAL LOW (ref 2.8–20.0)

## 2013-07-29 LAB — HEPATITIS PANEL, ACUTE
HCV Ab: NEGATIVE
Hep A IgM: NONREACTIVE
Hep B C IgM: NONREACTIVE
Hepatitis B Surface Ag: NEGATIVE

## 2013-07-29 LAB — POTASSIUM: Potassium: 3 mEq/L — ABNORMAL LOW (ref 3.7–5.3)

## 2013-07-29 MED ORDER — FUROSEMIDE 40 MG PO TABS
40.0000 mg | ORAL_TABLET | Freq: Every day | ORAL | Status: DC
  Administered 2013-07-30 – 2013-08-02 (×4): 40 mg via ORAL
  Filled 2013-07-29 (×4): qty 1

## 2013-07-29 MED ORDER — POTASSIUM CHLORIDE CRYS ER 20 MEQ PO TBCR
20.0000 meq | EXTENDED_RELEASE_TABLET | Freq: Two times a day (BID) | ORAL | Status: DC
  Filled 2013-07-29 (×2): qty 1

## 2013-07-29 MED ORDER — AMLODIPINE BESYLATE 10 MG PO TABS
10.0000 mg | ORAL_TABLET | Freq: Every day | ORAL | Status: DC
  Administered 2013-07-29 – 2013-08-02 (×5): 10 mg via ORAL
  Filled 2013-07-29 (×5): qty 1

## 2013-07-29 MED ORDER — OXYCODONE HCL 5 MG PO TABS
5.0000 mg | ORAL_TABLET | ORAL | Status: DC | PRN
  Administered 2013-07-29 – 2013-08-02 (×22): 5 mg via ORAL
  Filled 2013-07-29 (×22): qty 1

## 2013-07-29 MED ORDER — GUAIFENESIN ER 600 MG PO TB12
1200.0000 mg | ORAL_TABLET | Freq: Two times a day (BID) | ORAL | Status: DC
  Administered 2013-07-29 – 2013-08-02 (×10): 1200 mg via ORAL
  Filled 2013-07-29 (×12): qty 2

## 2013-07-29 MED ORDER — BUPROPION HCL ER (XL) 150 MG PO TB24
150.0000 mg | ORAL_TABLET | Freq: Every day | ORAL | Status: DC
  Administered 2013-07-29 – 2013-08-02 (×5): 150 mg via ORAL
  Filled 2013-07-29 (×5): qty 1

## 2013-07-29 MED ORDER — DEXTROSE 5 % IV SOLN
1.0000 g | INTRAVENOUS | Status: DC
  Administered 2013-07-29 – 2013-08-02 (×5): 1 g via INTRAVENOUS
  Filled 2013-07-29 (×7): qty 10

## 2013-07-29 MED ORDER — METOPROLOL TARTRATE 12.5 MG HALF TABLET
12.5000 mg | ORAL_TABLET | Freq: Two times a day (BID) | ORAL | Status: DC
  Administered 2013-07-29 (×3): 12.5 mg via ORAL
  Filled 2013-07-29 (×5): qty 1

## 2013-07-29 MED ORDER — FLUTICASONE PROPIONATE 50 MCG/ACT NA SUSP
2.0000 | Freq: Every day | NASAL | Status: DC
  Administered 2013-07-29 – 2013-08-01 (×4): 2 via NASAL
  Filled 2013-07-29: qty 16

## 2013-07-29 MED ORDER — ASPIRIN 81 MG PO CHEW
81.0000 mg | CHEWABLE_TABLET | Freq: Every day | ORAL | Status: DC
  Administered 2013-07-29 – 2013-08-02 (×5): 81 mg via ORAL
  Filled 2013-07-29 (×5): qty 1

## 2013-07-29 MED ORDER — ONDANSETRON HCL 4 MG PO TABS: 4.0000 mg | ORAL_TABLET | Freq: Four times a day (QID) | ORAL | Status: DC | PRN

## 2013-07-29 MED ORDER — ALBUTEROL SULFATE (2.5 MG/3ML) 0.083% IN NEBU: 2.5000 mg | INHALATION_SOLUTION | Freq: Four times a day (QID) | RESPIRATORY_TRACT | Status: DC | PRN

## 2013-07-29 MED ORDER — SENNA 8.6 MG PO TABS
3.0000 | ORAL_TABLET | Freq: Every day | ORAL | Status: DC
  Administered 2013-07-30: 25.8 mg via ORAL
  Filled 2013-07-29 (×5): qty 3

## 2013-07-29 MED ORDER — SODIUM CHLORIDE 0.9 % IV SOLN
INTRAVENOUS | Status: DC
  Administered 2013-07-29: 20 mL/h via INTRAVENOUS

## 2013-07-29 MED ORDER — ONDANSETRON HCL 4 MG/2ML IJ SOLN: 4.0000 mg | Freq: Four times a day (QID) | INTRAMUSCULAR | Status: DC | PRN

## 2013-07-29 MED ORDER — TECHNETIUM TC 99M MEBROFENIN IV KIT
5.0000 | PACK | Freq: Once | INTRAVENOUS | Status: AC | PRN
  Administered 2013-07-29: 5 via INTRAVENOUS

## 2013-07-29 MED ORDER — POTASSIUM CHLORIDE 10 MEQ/100ML IV SOLN
10.0000 meq | INTRAVENOUS | Status: AC
  Administered 2013-07-29 – 2013-07-30 (×4): 10 meq via INTRAVENOUS
  Filled 2013-07-29 (×5): qty 100

## 2013-07-29 MED ORDER — POTASSIUM CHLORIDE 10 MEQ/100ML IV SOLN
10.0000 meq | INTRAVENOUS | Status: AC
  Administered 2013-07-29 (×4): 10 meq via INTRAVENOUS
  Filled 2013-07-29 (×4): qty 100

## 2013-07-29 MED ORDER — DIPHENHYDRAMINE HCL 25 MG PO CAPS: 25.0000 mg | ORAL_CAPSULE | Freq: Four times a day (QID) | ORAL | Status: DC | PRN

## 2013-07-29 MED ORDER — TAMSULOSIN HCL 0.4 MG PO CAPS
0.4000 mg | ORAL_CAPSULE | Freq: Every day | ORAL | Status: DC
  Administered 2013-07-29 – 2013-08-01 (×4): 0.4 mg via ORAL
  Filled 2013-07-29 (×5): qty 1

## 2013-07-29 MED ORDER — DOCUSATE SODIUM 100 MG PO CAPS
100.0000 mg | ORAL_CAPSULE | Freq: Two times a day (BID) | ORAL | Status: DC
  Administered 2013-07-29 – 2013-08-02 (×5): 100 mg via ORAL
  Filled 2013-07-29 (×11): qty 1

## 2013-07-29 MED ORDER — NIACIN 500 MG PO TABS
1000.0000 mg | ORAL_TABLET | Freq: Every day | ORAL | Status: DC
  Administered 2013-07-29: 1000 mg via ORAL
  Filled 2013-07-29 (×2): qty 2

## 2013-07-29 NOTE — Progress Notes (Signed)
PATIENT DETAILS Name: [name] Age: 73 y.o. Sex: male Date of Birth: [birth year] Admit Date: 07/28/2013 Admitting Physician [name], MD TDD:[id number] Asa Lente, MD  Subjective: Mild hematuria but urine seems to be clearing  Assessment/Plan: Principal Problem:   Elevated LFTs -etiology uncertain-but some suspicion for secondary to excessive Tylenol usage (takes percocet for arthritis)-therefore on Mucomyst protocol-with some decrease in LFT's today. Does have a hx of ETOH use, but claims to have quit >10 yrs back. -Belly is soft, CT neg for acute abnormalities-with small gallstones - Hepatitis serology pending  Active Problems: Acute Urinary Retention -Foley catheter placed in ED-traumatic insertion-suspect BPH/CT Abd showed bladder calcification-concerning for transitional cell cancer -c/w Flomax-needs outpatient follow up with Urology for voiding trial and further work up  Hematuria -secondary to traumatic foley insertion -will flush catheter and monitor-seems to clearing slowly-if persistent will ask Urology to consult -c/w empiric Rocephin  CAD -cautiously c/w ASA-if hematuria persistent will stop -c/w Metoprolol, hold Niacin and statin till LFT's stable  ?Liver Cirrhoses -cirrhotic liver seen on CT Abd -does have hx of ETOH use-but that was more than 10 years back, will check hepatitis serology -suspect further work up can be done in the outpatient setting    HYPERLIPIDEMIA -hold Niacin and statin till LFT's stable    HYPERTENSION -c/w Metoprolol, Amlodipine   Hypokalemia -likely secondary to lasix use -replete and recheck in am  Chronic Pain syndrome -claims he has severe pain in multiple joints from OA -on chronic narcotics-will stop percocet-and just continue oxycodone (without tylenol)  Hx of AAA -Chronic issue-infra renal AAA-stable at 3.1 cm  Disposition: Remain inpatient  DVT Prophylaxis:  SCD's  Code Status: Full  code  Family Communication None at bedside  Procedures:  None  CONSULTS:  None  Time spent 40 minutes-which includes 50% of the time with face-to-face with patient/ family and coordinating care related to the above assessment and plan.    MEDICATIONS: Scheduled Meds: . acetylcysteine  70 mg/kg Oral Q4H  . amLODipine  10 mg Oral Daily  . aspirin  81 mg Oral Daily  . buPROPion  150 mg Oral Daily  . cefTRIAXone (ROCEPHIN)  IV  1 g Intravenous Q24H  . docusate sodium  100 mg Oral BID  . fluticasone  2 spray Each Nare Daily  . guaiFENesin  1,200 mg Oral BID  . metoprolol tartrate  12.5 mg Oral BID  . niacin  1,000 mg Oral QHS  . potassium chloride  10 mEq Intravenous Q1 Hr x 4  . senna  3 tablet Oral Daily  . tamsulosin  0.4 mg Oral QPC supper   Continuous Infusions: . sodium chloride 20 mL/hr (07/29/13 0132)   PRN Meds:.albuterol, diphenhydrAMINE, ondansetron (ZOFRAN) IV, ondansetron, oxyCODONE  Antibiotics: Anti-infectives   Start     Dose/Rate Route Frequency Ordered Stop   07/29/13 0800  cefTRIAXone (ROCEPHIN) 1 g in dextrose 5 % 50 mL IVPB     1 g 100 mL/hr over 30 Minutes Intravenous Every 24 hours 07/29/13 0655         PHYSICAL EXAM: Vital signs in last 24 hours: Filed Vitals:   07/28/13 2330 07/29/13 0013 07/29/13 0432 07/29/13 0530  BP: 139/48 162/72  135/69  Pulse: 73 68  66  Temp:  97.9 F (36.6 C)  98.1 F (36.7 C)  TempSrc:    Oral  Resp:  18  18  Height:      Weight:  97.2  kg (214 lb 4.6 oz) 97.2 kg (214 lb 4.6 oz)   SpO2: 96% 97%  98%    Weight change:  Filed Weights   07/28/13 1423 07/29/13 0013 07/29/13 0432  Weight: 99.791 kg (220 lb) 97.2 kg (214 lb 4.6 oz) 97.2 kg (214 lb 4.6 oz)   Body mass index is 29.06 kg/(m^2).   Gen Exam: Awake and alert with clear speech.   Neck: Supple, No JVD.   Chest: B/L Clear.   CVS: S1 S2 Regular, no murmurs.  Abdomen: soft, BS +, non tender, non distended.  Extremities: no edema, lower  extremities warm to touch. Neurologic: Non Focal.   Skin: No Rash.   Wounds: N/A.    Intake/Output from previous day:  Intake/Output Summary (Last 24 hours) at 07/29/13 1031 Last data filed at 07/29/13 0535  Gross per 24 hour  Intake      0 ml  Output   1250 ml  Net  -1250 ml     LAB RESULTS: CBC  Recent Labs Lab 07/28/13 1603 07/29/13 0654  WBC 5.0 8.9  HGB 13.5 12.8*  HCT 39.3 37.6*  PLT 165 167  MCV 90.6 88.9  MCH 31.1 30.3  MCHC 34.4 34.0  RDW 14.3 14.1    Chemistries   Recent Labs Lab 07/28/13 1603 07/29/13 0654  NA 140 140  K 3.4* 2.9*  CL 99 99  CO2 21 18*  GLUCOSE 102* 129*  BUN 13 15  CREATININE 0.77 0.74  CALCIUM 9.3 9.0    CBG: No results found for this basename: GLUCAP,  in the last 168 hours  GFR Estimated Creatinine Clearance: 103.8 ml/min (by C-G formula based on Cr of 0.74).  Coagulation profile  Recent Labs Lab 07/28/13 1643 07/29/13 0654  INR 1.07 1.21    Cardiac Enzymes No results found for this basename: CK, CKMB, TROPONINI, MYOGLOBIN,  in the last 168 hours  No components found with this basename: POCBNP,  No results found for this basename: DDIMER,  in the last 72 hours No results found for this basename: HGBA1C,  in the last 72 hours No results found for this basename: CHOL, HDL, LDLCALC, TRIG, CHOLHDL, LDLDIRECT,  in the last 72 hours No results found for this basename: TSH, T4TOTAL, FREET3, T3FREE, THYROIDAB,  in the last 72 hours No results found for this basename: VITAMINB12, FOLATE, FERRITIN, TIBC, IRON, RETICCTPCT,  in the last 72 hours  Recent Labs  07/28/13 1643  LIPASE 26    Urine Studies No results found for this basename: UACOL, UAPR, USPG, UPH, UTP, UGL, UKET, UBIL, UHGB, UNIT, UROB, ULEU, UEPI, UWBC, URBC, UBAC, CAST, CRYS, UCOM, BILUA,  in the last 72 hours  MICROBIOLOGY: No results found for this or any previous visit (from the past 240 hour(s)).  RADIOLOGY STUDIES/RESULTS: Ct Abdomen Pelvis W  Contrast  07/28/2013   CLINICAL DATA:  Unable to urinate. Abdominal discomfort and constipation.  EXAM: CT ABDOMEN AND PELVIS WITH CONTRAST  TECHNIQUE: Multidetector CT imaging of the abdomen and pelvis was performed using the standard protocol following bolus administration of intravenous contrast.  CONTRAST:  128mL OMNIPAQUE IOHEXOL 300 MG/ML  SOLN  COMPARISON:  CT ANGIO ABDOMEN W/CM &/OR WO/CM dated 10/28/2012; CT ANGIO ABDOMEN W/CM &/OR WO/CM dated 10/10/2011  FINDINGS: Lung bases show no acute findings. Heart is at the upper limits of normal in size. No pericardial or pleural effusion.  Liver margin is markedly irregular. Small stones layer in the gallbladder. Adrenal glands are unremarkable. Mild bilateral  hydronephrosis. Bladder is distended and contains calcific density along the posterior wall. Foley catheter is seen within the bladder. No definite urinary stones.  Spleen, pancreas, stomach and bowel are unremarkable. Prostate is enlarged. No pathologically enlarged lymph nodes. No free fluid. Atherosclerotic calcification of the arterial vasculature. Infrarenal aorta measures up to 3.1 cm above the bifurcation. No worrisome lytic or sclerotic lesions. Advanced degenerative changes are seen in the spine.  IMPRESSION: 1. Mild bilateral hydronephrosis is likely secondary to marked bladder distention, despite indwelling Foley catheter. 2. Calcification along the posterior wall of the bladder. Transitional cell carcinoma cannot be excluded. 3. Prostate enlargement. 4. Advanced cirrhosis. 5. Small infrarenal aortic aneurysm.   Electronically Signed   By: Lorin Picket M.D.   On: 07/28/2013 19:22   US Abdomen Limited Ruq  07/28/2013   CLINICAL DATA:  Elevated LFTs.  EXAM: US ABDOMEN LIMITED - RIGHT UPPER QUADRANT  COMPARISON:  CT 10/10/2011  FINDINGS: Gallbladder:  There is evidence of mild cholelithiasis. Gallbladder wall thickening is present measuring 4.6 mm. Negative sonographic Murphy's sign. No adjacent  free fluid.  Common bile duct:  Diameter: 4.8 mm proximally and 5 mm distally.  Liver:  Somewhat small with nodular contour and coarsened echotexture compatible with cirrhosis. No definite focal mass.  IMPRESSION: Mild cholelithiasis with associated gallbladder wall thickening. No ductal dilatation. Negative sonographic Murphy sign. Recommend clinical correlation for acute cholecystitis.  Evidence of cirrhosis.   Electronically Signed   By: Marin Olp M.D.   On: 07/28/2013 18:52    Shanker Kristeen Mans, MD  [facility] Pager:[phone]  If 7PM-7AM, please contact night-coverage [email or web address] Password [email or web address] 07/29/2013, 10:31 AM   LOS: 1 day

## 2013-07-29 NOTE — Progress Notes (Signed)
Utilization review completed.  

## 2013-07-29 NOTE — Progress Notes (Signed)
Pt arrived on floor. Alert and oriented x4 with R hand IV, no sign of redness noted. Appears to be in no distress no c/o pain. Oriented to room and staff. Pt care guide provided. Vital signs taken and stable. Will continue to monitor.

## 2013-07-29 NOTE — Progress Notes (Signed)
Notified on call provider of patient's am potassium level and that he is currently not on tele.  No telemetry orders given at this time.  On call provider to follow up on current potassium level.  Will continue to monitor.

## 2013-07-29 NOTE — Progress Notes (Signed)
CRITICAL VALUE ALERT  Critical value received:  Potassium 2.9 Date of notification:  07/29/13  Time of notification:  8:16a,  Critical value read back:yes  Nurse who received alert:  Audria Nine  MD notified (1st page):  Ghimire  Time of first page:  8:20 am  MD notified (2nd page):  Time of second page:  Responding MD:  Sloan Leiter  Time MD responded:  8:20

## 2013-07-29 NOTE — Progress Notes (Signed)
Foley irrigated at this time, a few clots were removed. Will continue to monitor.

## 2013-07-29 NOTE — Progress Notes (Signed)
Pharmacy: Acetylcysteine (Mucomyst) for Potential Tylenol Overdose - Follow-up  This patient was discussed with Poison Control [phone]) this evening in regards to the updated lab-work drawn this afternoon. Given the fact that the patient's LFTs are trending down, APAP and salicylate levels are undetectable, and INR <2 - poison control has given the okay to stop Mucomyst at this time. This was discussed with Walden Field, NP with [facility] and she stated that she would address it.  Pharmacy is available if you have any additional questions. For now, we will sign off of the consult, please re-consult [place] if additional help is needed.  Alycia Rossetti, PharmD, BCPS Clinical Pharmacist Pager: [phone] 07/29/2013 9:12 PM

## 2013-07-30 DIAGNOSIS — T391X1A Poisoning by 4-Aminophenol derivatives, accidental (unintentional), initial encounter: Secondary | ICD-10-CM | POA: Diagnosis not present

## 2013-07-30 DIAGNOSIS — E785 Hyperlipidemia, unspecified: Secondary | ICD-10-CM | POA: Diagnosis not present

## 2013-07-30 DIAGNOSIS — R339 Retention of urine, unspecified: Secondary | ICD-10-CM | POA: Diagnosis not present

## 2013-07-30 DIAGNOSIS — I714 Abdominal aortic aneurysm, without rupture: Secondary | ICD-10-CM | POA: Diagnosis not present

## 2013-07-30 LAB — BASIC METABOLIC PANEL
BUN: 9 mg/dL (ref 6–23)
CO2: 19 mEq/L (ref 19–32)
Calcium: 8.6 mg/dL (ref 8.4–10.5)
Chloride: 102 mEq/L (ref 96–112)
Creatinine, Ser: 0.64 mg/dL (ref 0.50–1.35)
GFR calc Af Amer: >90 mL/min (ref >90)
GFR calc non Af Amer: >90 mL/min (ref >90)
Glucose, Bld: 95 mg/dL (ref 70–99)
Potassium: 3 mEq/L — ABNORMAL LOW (ref 3.7–5.3)
Sodium: 140 mEq/L (ref 137–147)

## 2013-07-30 LAB — HEPATITIS PANEL, ACUTE
HCV Ab: NEGATIVE
Hep A IgM: NONREACTIVE
Hep B C IgM: NONREACTIVE
Hepatitis B Surface Ag: NEGATIVE

## 2013-07-30 LAB — CBC
HCT: 34.6 % — ABNORMAL LOW (ref 39.0–52.0)
Hemoglobin: 12 g/dL — ABNORMAL LOW (ref 13.0–17.0)
MCH: 30.9 pg (ref 26.0–34.0)
MCHC: 34.7 g/dL (ref 30.0–36.0)
MCV: 89.2 fL (ref 78.0–100.0)
Platelets: 142 10*3/uL — ABNORMAL LOW (ref 150–400)
RBC: 3.88 MIL/uL — ABNORMAL LOW (ref 4.22–5.81)
RDW: 14.3 % (ref 11.5–15.5)
WBC: 6.2 10*3/uL (ref 4.0–10.5)

## 2013-07-30 LAB — HEPATIC FUNCTION PANEL
ALT: 146 U/L — ABNORMAL HIGH (ref 0–53)
AST: 96 U/L — ABNORMAL HIGH (ref 0–37)
Albumin: 3.5 g/dL (ref 3.5–5.2)
Alkaline Phosphatase: 143 U/L — ABNORMAL HIGH (ref 39–117)
Bilirubin, Direct: 0.5 mg/dL — ABNORMAL HIGH (ref 0.0–0.3)
Indirect Bilirubin: 1.6 mg/dL — ABNORMAL HIGH (ref 0.3–0.9)
Total Bilirubin: 2.1 mg/dL — ABNORMAL HIGH (ref 0.3–1.2)
Total Protein: 7.2 g/dL (ref 6.0–8.3)

## 2013-07-30 MED ORDER — METOPROLOL TARTRATE 12.5 MG HALF TABLET
12.5000 mg | ORAL_TABLET | Freq: Every day | ORAL | Status: DC
  Administered 2013-07-31: 12.5 mg via ORAL
  Filled 2013-07-30: qty 1

## 2013-07-30 MED ORDER — POTASSIUM CHLORIDE CRYS ER 20 MEQ PO TBCR
40.0000 meq | EXTENDED_RELEASE_TABLET | Freq: Two times a day (BID) | ORAL | Status: DC
  Administered 2013-07-30 – 2013-08-02 (×7): 40 meq via ORAL
  Filled 2013-07-30 (×8): qty 2

## 2013-07-30 NOTE — Progress Notes (Signed)
Patient complained of burning while IV potassium was infusing.  Consulted with pharmacy to run potassium at 43ml/hr.  Patient states that IV potassium does not burn anymore.  Will continue to monitor patient.

## 2013-07-30 NOTE — Progress Notes (Signed)
Notified on-call provider of pt having missed beats on telemetry. Pt asymptomatic, no complaints of distress, shortness of breath or chest pain. On-call provider instructed to continue to monitor patient and notify of any distress. Will continue to monitor.

## 2013-07-30 NOTE — Progress Notes (Addendum)
PATIENT DETAILS Name: [name] Age: 73 y.o. Sex: male Date of Birth: 03-07-1941 Admit Date: 07/28/2013 Admitting Physician [name], MD FTD:[id number] Asa Lente, MD  Subjective: Mild hematuria continues  Assessment/Plan: Principal Problem:   Elevated LFTs -etiology uncertain-but some suspicion for secondary to excessive Tylenol usage (takes percocet for arthritis)-therefore started on Mucomyst protocol on admission, Poison control now recommending no further mucomyst.  LFT's slowly decreasingy. Does have a hx of ETOH use, but claims to have quit >10 yrs back. -Belly is soft, CT neg for acute abnormalities-with small gallstones - Hepatitis serology negative  Active Problems: Acute Urinary Retention -Foley catheter placed in ED-traumatic insertion-suspect BPH/CT Abd showed bladder calcification-concerning for transitional cell cancer -c/w Flomax-needs outpatient follow up with Urology for voiding trial and further work up.  Hematuria -secondary to traumatic foley insertion -continue to flush catheter and monitor-will touch base with urology -c/w empiric Rocephin  Addendum: Spoke with urology-Dr. Jasmine December over the phone, since urine continues to clear, no further workup required while inpatient. Patient needs followup with urology on discharge.  CAD -cautiously c/w ASA-if hematuria persistent will stop -c/w Metoprolol, hold Niacin and statin till LFT's stable  ?Liver Cirrhoses -cirrhotic liver seen on CT Abd -does have hx of ETOH use-but that was more than 10 years back, hepatitis serology serology negative -suspect further work up can be done in the outpatient setting    HYPERLIPIDEMIA -hold Niacin and statin till LFT's stable    HYPERTENSION -c/w Metoprolol, Amlodipine   Hypokalemia -likely secondary to lasix use -replete and recheck in am  Chronic Pain syndrome -claims he has severe pain in multiple joints from OA -on chronic narcotics-will  stop percocet-and just continue oxycodone (without tylenol)  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 amLODipine  10 mg Oral Daily  . aspirin  81 mg Oral Daily  . buPROPion  150 mg Oral Daily  . cefTRIAXone (ROCEPHIN)  IV  1 g Intravenous Q24H  . docusate sodium  100 mg Oral BID  . fluticasone  2 spray Each Nare Daily  . furosemide  40 mg Oral Daily  . guaiFENesin  1,200 mg Oral BID  . [START ON 07/31/2013] metoprolol tartrate  12.5 mg Oral Daily  . potassium chloride SA  40 mEq Oral BID  . senna  3 tablet Oral Daily  . tamsulosin  0.4 mg Oral QPC supper   Continuous Infusions: . sodium chloride 20 mL/hr (07/29/13 0132)   PRN Meds:.albuterol, diphenhydrAMINE, ondansetron (ZOFRAN) IV, ondansetron, oxyCODONE  Antibiotics: Anti-infectives   Start     Dose/Rate Route Frequency Ordered Stop   07/29/13 0800  cefTRIAXone (ROCEPHIN) 1 g in dextrose 5 % 50 mL IVPB     1 g 100 mL/hr over 30 Minutes Intravenous Every 24 hours 07/29/13 0655         PHYSICAL EXAM: Vital signs in last 24 hours: Filed Vitals:   07/29/13 0432 07/29/13 0530 07/29/13 2044 07/30/13 0613  BP:  135/69 153/60 152/72  Pulse:  66 69 63  Temp:  98.1 F (36.7 C) 97.8 F (36.6 C) 98.1 F (36.7 C)  TempSrc:  Oral Oral Oral  Resp:  18 18 18   Height:      Weight: 97.2 kg (214 lb  4.6 oz)   99.066 kg (218 lb 6.4 oz)  SpO2:  98% 92% 92%    Weight change: -0.726 kg (-1 lb 9.6 oz) Filed Weights   07/29/13 0013 07/29/13 0432 07/30/13 1610  Weight: 97.2 kg (214 lb 4.6 oz) 97.2 kg (214 lb 4.6 oz) 99.066 kg (218 lb 6.4 oz)   Body mass index is 29.61 kg/(m^2).   Gen Exam: Awake and alert with clear speech.   Neck: Supple, No JVD.   Chest: B/L Clear.   CVS: S1 S2 Regular, no murmurs.   Abdomen: soft, BS +, non tender, non distended.  Extremities: no edema, lower extremities warm to touch. Neurologic: Non Focal.   Skin: No Rash.   Wounds: N/A.    Intake/Output from previous day:  Intake/Output Summary (Last 24 hours) at 07/30/13 1043 Last data filed at 07/30/13 0653  Gross per 24 hour  Intake 1366.33 ml  Output   1900 ml  Net -533.67 ml     LAB RESULTS: CBC  Recent Labs Lab 07/28/13 1603 07/29/13 0654 07/30/13 0625  WBC 5.0 8.9 6.2  HGB 13.5 12.8* 12.0*  HCT 39.3 37.6* 34.6*  PLT 165 167 142*  MCV 90.6 88.9 89.2  MCH 31.1 30.3 30.9  MCHC 34.4 34.0 34.7  RDW 14.3 14.1 14.3    Chemistries   Recent Labs Lab 07/28/13 1603 07/29/13 0654 07/29/13 1914 07/29/13 2252 07/30/13 0625  NA 140 140 140  --  140  K 3.4* 2.9* 3.0* 3.0* 3.0*  CL 99 99 100  --  102  CO2 21 18* 21  --  19  GLUCOSE 102* 129* 128*  --  95  BUN 13 15 12   --  9  CREATININE 0.77 0.74 0.69  --  0.64  CALCIUM 9.3 9.0 8.6  --  8.6    CBG: No results found for this basename: GLUCAP,  in the last 168 hours  GFR Estimated Creatinine Clearance: 104.8 ml/min (by C-G formula based on Cr of 0.64).  Coagulation profile  Recent Labs Lab 07/28/13 1643 07/29/13 0654 07/29/13 1914  INR 1.07 1.21 1.26    Cardiac Enzymes No results found for this basename: CK, CKMB, TROPONINI, MYOGLOBIN,  in the last 168 hours  No components found with this basename: POCBNP,  No results found for this basename: DDIMER,  in the last 72 hours No results found for this basename: HGBA1C,  in the last 72 hours No results found for this basename: CHOL, HDL, LDLCALC, TRIG, CHOLHDL, LDLDIRECT,  in the last 72 hours No results found for this basename: TSH, T4TOTAL, FREET3, T3FREE, THYROIDAB,  in the last 72 hours No results found for this basename: VITAMINB12, FOLATE, FERRITIN, TIBC, IRON, RETICCTPCT,  in the last 72 hours  Recent Labs  07/28/13 1643  LIPASE 26    Urine Studies No results  found for this basename: UACOL, UAPR, USPG, UPH, UTP, UGL, UKET, UBIL, UHGB, UNIT, UROB, ULEU, UEPI, UWBC, URBC, UBAC, CAST, CRYS, UCOM, BILUA,  in the last 72 hours  MICROBIOLOGY: No results found for this or any previous visit (from the past 240 hour(s)).  RADIOLOGY STUDIES/RESULTS: Ct Abdomen Pelvis W Contrast  07/28/2013   CLINICAL DATA:  Unable to urinate. Abdominal discomfort and constipation.  EXAM: CT ABDOMEN AND PELVIS WITH CONTRAST  TECHNIQUE: Multidetector CT imaging of the abdomen and pelvis was performed using the standard protocol following bolus administration of intravenous contrast.  CONTRAST:  135mL OMNIPAQUE IOHEXOL 300 MG/ML  SOLN  COMPARISON:  CT  ANGIO ABDOMEN W/CM &/OR WO/CM dated 10/28/2012; CT ANGIO ABDOMEN W/CM &/OR WO/CM dated 10/10/2011  FINDINGS: Lung bases show no acute findings. Heart is at the upper limits of normal in size. No pericardial or pleural effusion.  Liver margin is markedly irregular. Small stones layer in the gallbladder. Adrenal glands are unremarkable. Mild bilateral hydronephrosis. Bladder is distended and contains calcific density along the posterior wall. Foley catheter is seen within the bladder. No definite urinary stones.  Spleen, pancreas, stomach and bowel are unremarkable. Prostate is enlarged. No pathologically enlarged lymph nodes. No free fluid. Atherosclerotic calcification of the arterial vasculature. Infrarenal aorta measures up to 3.1 cm above the bifurcation. No worrisome lytic or sclerotic lesions. Advanced degenerative changes are seen in the spine.  IMPRESSION: 1. Mild bilateral hydronephrosis is likely secondary to marked bladder distention, despite indwelling Foley catheter. 2. Calcification along the posterior wall of the bladder. Transitional cell carcinoma cannot be excluded. 3. Prostate enlargement. 4. Advanced cirrhosis. 5. Small infrarenal aortic aneurysm.   Electronically Signed   By: Lorin Picket M.D.   On: 07/28/2013 19:22   US  Abdomen Limited Ruq  07/28/2013   CLINICAL DATA:  Elevated LFTs.  EXAM: US ABDOMEN LIMITED - RIGHT UPPER QUADRANT  COMPARISON:  CT 10/10/2011  FINDINGS: Gallbladder:  There is evidence of mild cholelithiasis. Gallbladder wall thickening is present measuring 4.6 mm. Negative sonographic Murphy's sign. No adjacent free fluid.  Common bile duct:  Diameter: 4.8 mm proximally and 5 mm distally.  Liver:  Somewhat small with nodular contour and coarsened echotexture compatible with cirrhosis. No definite focal mass.  IMPRESSION: Mild cholelithiasis with associated gallbladder wall thickening. No ductal dilatation. Negative sonographic Murphy sign. Recommend clinical correlation for acute cholecystitis.  Evidence of cirrhosis.   Electronically Signed   By: Marin Olp M.D.   On: 07/28/2013 18:52    Mable Dara Kristeen Mans, MD  [facility] Pager:[phone]  If 7PM-7AM, please contact night-coverage [email or web address] Password [email or web address] 07/30/2013, 10:43 AM   LOS: 2 days

## 2013-07-30 NOTE — Progress Notes (Signed)
Central tele notified RN of a 2.5 second sinus pause.  Patient is not complaining of any distress.  Notified on call provider.  Will continue to monitor.

## 2013-07-31 DIAGNOSIS — I10 Essential (primary) hypertension: Secondary | ICD-10-CM | POA: Diagnosis not present

## 2013-07-31 DIAGNOSIS — N329 Bladder disorder, unspecified: Secondary | ICD-10-CM | POA: Diagnosis not present

## 2013-07-31 DIAGNOSIS — K7460 Unspecified cirrhosis of liver: Secondary | ICD-10-CM | POA: Diagnosis not present

## 2013-07-31 DIAGNOSIS — R350 Frequency of micturition: Secondary | ICD-10-CM | POA: Diagnosis not present

## 2013-07-31 DIAGNOSIS — R310 Gross hematuria: Secondary | ICD-10-CM | POA: Diagnosis not present

## 2013-07-31 DIAGNOSIS — T391X1A Poisoning by 4-Aminophenol derivatives, accidental (unintentional), initial encounter: Secondary | ICD-10-CM | POA: Diagnosis not present

## 2013-07-31 DIAGNOSIS — I509 Heart failure, unspecified: Secondary | ICD-10-CM | POA: Diagnosis not present

## 2013-07-31 DIAGNOSIS — N1330 Unspecified hydronephrosis: Secondary | ICD-10-CM | POA: Diagnosis not present

## 2013-07-31 DIAGNOSIS — R7989 Other specified abnormal findings of blood chemistry: Secondary | ICD-10-CM | POA: Diagnosis not present

## 2013-07-31 DIAGNOSIS — R339 Retention of urine, unspecified: Secondary | ICD-10-CM | POA: Diagnosis not present

## 2013-07-31 DIAGNOSIS — E785 Hyperlipidemia, unspecified: Secondary | ICD-10-CM | POA: Diagnosis not present

## 2013-07-31 LAB — COMPREHENSIVE METABOLIC PANEL
ALT: 113 U/L — ABNORMAL HIGH (ref 0–53)
AST: 67 U/L — ABNORMAL HIGH (ref 0–37)
Albumin: 3.3 g/dL — ABNORMAL LOW (ref 3.5–5.2)
Alkaline Phosphatase: 119 U/L — ABNORMAL HIGH (ref 39–117)
BUN: 14 mg/dL (ref 6–23)
CO2: 25 mEq/L (ref 19–32)
Calcium: 8.6 mg/dL (ref 8.4–10.5)
Chloride: 102 mEq/L (ref 96–112)
Creatinine, Ser: 0.71 mg/dL (ref 0.50–1.35)
GFR calc Af Amer: >90 mL/min (ref >90)
GFR calc non Af Amer: >90 mL/min (ref >90)
Glucose, Bld: 110 mg/dL — ABNORMAL HIGH (ref 70–99)
Potassium: 3.2 mEq/L — ABNORMAL LOW (ref 3.7–5.3)
Sodium: 140 mEq/L (ref 137–147)
Total Bilirubin: 1.4 mg/dL — ABNORMAL HIGH (ref 0.3–1.2)
Total Protein: 6.7 g/dL (ref 6.0–8.3)

## 2013-07-31 MED ORDER — SODIUM CHLORIDE 0.9 % IR SOLN
3000.0000 mL | Status: DC
  Administered 2013-07-31 – 2013-08-02 (×11): 3000 mL

## 2013-07-31 MED ORDER — FINASTERIDE 5 MG PO TABS
5.0000 mg | ORAL_TABLET | Freq: Every day | ORAL | Status: DC
  Administered 2013-07-31 – 2013-08-02 (×3): 5 mg via ORAL
  Filled 2013-07-31 (×4): qty 1

## 2013-07-31 NOTE — Progress Notes (Signed)
While sleeping, HR went briefly to the 40's-with Mobitz type 1 block, patient awoken-back in sinus. Vitals stable. Will stop Beta blocker, hold transfer to [facility], will transfer to SDU where will attempt to continuously irrigate bladder. Case d/w Dr Stanford Breed over the phone, will need to consult cards formally if these episodes recur.

## 2013-07-31 NOTE — Progress Notes (Signed)
Report given to Sunday Spillers on [facility] for transfer

## 2013-07-31 NOTE — Progress Notes (Signed)
Verbal MD order to irrigate bladder through catheter for 15-20 minutes until urine becomes more clear.   6168: Irrigated catheter with 183ml Sterile Water and 524ml bright red urine returned with few small clots. Continued irrigation until urine became pink tinged over 30 minutes. Irrigated with 1175mL of Sterile Water used in total. Emptied catheter bag again with output of 1155mL. Patient tolerated well.

## 2013-07-31 NOTE — Consult Note (Signed)
Urology Consult  Requesting provider:  Dr. Sloan Leiter  CC: Gross hematuria. Bladder mass  HPI: 73 year old male presented to the ER 4/dry/15 with urinary retention. As a new problem. He had had increasing frequency and a weak urinary stream the week leading up to this. It never had treatment for this. He states that he did try Flomax from one of his friends which improved his symptoms. He denies gross hematuria.  During ER evaluation he was found to have a distended bladder as well as elevated liver function tests and concern over possible cirrhosis of the liver. He was admitted to an internal medicine service after catheter was placed. He noted that he tried Flomax from one of his friends and did not have any side effects. This is been continued in the hospital.  He developed gross hematuria. This began after his catheter was placed. It is not associated with fever. Nothing makes it better. He's never had this before. This is a new problem. This history is consistent with catheter trauma. When I evaluated his catheter today I deflated the balloon and there were only 3 cc in the balloon indicating that this may not have been in the correct position.  He had a CT of the abdomen. This revealed Foley catheter in place with a distended bladder. He also had bilateral hydronephrosis. He was noted to have a possible bladder mass. This was due to calcification in a dependent portion of the bladder. He has a personal history of smoking for 50 years; he quit 5 years ago. Negative family history of renal cancer, bladder cancer, prostate cancer, or renal failure. He has never had chemotherapy, worked with textile dye, or worked with Barr[employer]. We discussed the need for cystoscopy as an outpatient to rule out bladder tumor. I explained that patient with a large prostate and developed stones in the bladder's, but bladder tumors can also be associated with stones.  A new catheter was placed using sterile  technique. Please refer to procedure section of this consult note for details.  PMH: Past Medical History  Diagnosis Date  . 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 COPD (chronic obstructive pulmonary disease)   . Cerebrovascular disease   . AAA (abdominal aortic aneurysm)   . 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 Hyperlipidemia   . HTN (hypertension)   . CONGENITAL UNSPEC REDUCTION DEFORMITY LOWER LIMB   . TOBACCO USE, QUIT   . CHF (congestive heart failure)     PSH: Past Surgical History  Procedure Laterality Date  . Coronary artery bypass graft  11/03/2008    Ricard Dillon - x4: left internal mammary artery to the distal left anterior descending, saphemous vein graft to the first circumflex marginal branch with a Y graft sequentiallly to a left posterolateral branch, saphenous vein graft to the distal right coronary artery    Allergies: Allergies  Allergen Reactions  . Hydrocodone     REACTION: Rash    Medications: Prescriptions prior to admission  Medication Sig Dispense Refill  . acetaminophen (TYLENOL) 500 MG tablet Take 500 mg by mouth every 6 (six) hours as needed. For pain      . albuterol (PROVENTIL HFA;VENTOLIN HFA) 108 (90 BASE) MCG/ACT inhaler Inhale into the lungs every 6 (six) hours as needed for wheezing or shortness of breath.      [name] amLODipine (NORVASC) 10 MG tablet Take 10 mg by mouth daily.      [name]  aspirin 81 MG tablet Take 81 mg by mouth daily.        [name] atorvastatin (LIPITOR) 40 MG tablet Take 40 mg by mouth daily.      [name] buPROPion (WELLBUTRIN XL) 150 MG 24 hr tablet Take 150 mg by mouth daily.      . diphenhydrAMINE (BENADRYL) 25 mg capsule Take 25 mg by mouth every 6 (six) hours as needed. For itch/rash      . docusate sodium (STOOL SOFTENER) 100 MG capsule Take 100 mg by mouth 2 (two) times daily.        . furosemide (LASIX) 40 MG tablet Take 40 mg by mouth daily.      [name] guaiFENesin (MUCINEX) 600 MG 12 hr tablet Take 1,200 mg by  mouth 2 (two) times daily.        . metoprolol tartrate (LOPRESSOR) 25 MG tablet Take 12.5 mg by mouth 2 (two) times daily.      . mometasone (NASONEX) 50 MCG/ACT nasal spray Place 2 sprays into the nose daily.      . niacin 500 MG tablet Take 1,000 mg by mouth at bedtime.      . potassium chloride SA (K-DUR,KLOR-CON) 20 MEQ tablet Take 20 mEq by mouth 2 (two) times daily.      . Sennosides 25 MG TABS Take 25 mg by mouth daily.       . tamsulosin (FLOMAX) 0.4 MG CAPS capsule Take 1 capsule (0.4 mg total) by mouth daily after supper.  30 capsule  0     Social History: History   Social History  . Marital Status: Widowed    Spouse Name: N/A    Number of Children: N/A  . Years of Education: N/A   Occupational History  . Not on file.   Social History Main Topics  . Smoking status: Former Research scientist (life sciences)  . Smokeless tobacco: Never Used  . Alcohol Use: No     Comment: History of heavy alcohol use per pt. Quit many years ago1/1/ 2005  . Drug Use: No  . Sexual Activity: Not on file   Other Topics Concern  . Not on file   Social History Narrative   lives alone here in [place].  He has 1 son, who lives in the [place], and he has     an uncle, who lives nearby as well.     Family History: Family History  Problem Relation Age of Onset  . Cirrhosis Mother     died at 77  . Colon cancer Neg Hx   . Stomach cancer Neg Hx     Review of Systems: Positive: Abdominal pain, weak stream, hematuria. Negative: Fever, SOB, or chest pain.  A further 10 point review of systems was negative except what is listed in the HPI.  Physical Exam: Filed Vitals:   07/31/13 0912  BP: 120/70  Pulse: 64  Temp:   Resp:     General: No acute distress.  Awake. Head:  Normocephalic.  Atraumatic. ENT:  EOMI.  Mucous membranes moist Neck:  Supple.  No lymphadenopathy. CV:  S1 present. S2 present. Regular rate. Pulmonary: Equal effort bilaterally.  Clear to auscultation  bilaterally. Abdomen: Soft.  Non- tender to palpation. Skin:  Normal turgor.  No visible rash. Extremity: No gross deformity of bilateral upper extremities.  No gross deformity of    bilateral lower extremities. Neurologic: Alert. Appropriate mood.  Penis:  Uncircumcised.  No lesions. Urethra: Foley catheter in place.  Orthotopic meatus. Scrotum:  No lesions.  No ecchymosis.  No erythema. Rectal:  Prostate >45cc. Negative nodules.   Studies:  Recent Labs     07/29/13  0654  07/30/13  0625  HGB  12.8*  12.0*  WBC  8.9  6.2  PLT  167  142*    Recent Labs     07/30/13  0625  07/31/13  0703  NA  140  140  K  3.0*  3.2*  CL  102  102  CO2  19  25  BUN  9  14  CREATININE  0.64  0.71  CALCIUM  8.6  8.6  GFRNONAA  >90  >90  GFRAA  >90  >90     Recent Labs     07/29/13  0654  07/29/13  1914  INR  1.21  1.26     No components found with this basename: ABG,   PROCEDURES: After his old catheter was removed his genitals were prepped and draped the usual sterile fashion. I then placed a 24 [place] hematuria coud catheter, three-way. There was immediate return of maroon colored urine. I inflated the catheter with 25 cc of sterile water. I then irrigated with 1 L of sterile water. This returned approximately 20 cc of old clot. His catheter ureter gated clear. I then connected this to continuous bladder irrigation and titrated down to a moderate drip.  Assessment:  Gross hematuria.  Plan: Likely source is catheter trauma.  Continuous bladder irrigation. Hand irrigate prn until free of clot.  Receiving good nursing care, but due to the short stitch of 3000 L irrigation bags, this makes the situation more difficult to deal with. I feel that this patient will be best served being transferred to a unit with nursing staff well acquainted with continuous bladder irrigation shortage. I recommend transfer to the fourth floor at [facility].  He will be able to eat discharged  home and I will follow him up as an outpatient for cystoscopy to rule out bladder cancer.  Continue Flomax for urinary retention.  I will start finasteride which should help decrease bleeding from the prostate.  Will follow.   Pager: [phone]    CC: Dr. Sloan Leiter

## 2013-07-31 NOTE — Progress Notes (Signed)
1545: Central Telemetry notified RN of suspected patient going into 2nd Degree Type 1 Heart Block. MD notified. HR 40s-50s on telemetry monitor.  1551: VSS. BP 113/61. HR 68. MD verbal order to get stat EKG.   1557: EKG completed and resulted in chart. MD notified and aware

## 2013-07-31 NOTE — Progress Notes (Addendum)
Patient's catheter was emptied at 2126 07/30/13 of 75 mL of bloody urine and then irrigated  with 20 mL sterile saline. A return of 725 mL of dark red urine with clots was noted. Patient called RN to room at 0155 stating that nothing had come from his catheter since the last irrigation. Catheter was irrigated at patient's request with 20 mL of sterile saline with no return. Catheter was irrigated again with another 20 mL and a return of approximately 5 mL of dark red fluid was noted. Bladder scan was completed and showed approximately 149 mL. At 0420 catheter was irrigated with 60 mL, instilling 20 mL at a time of sterile saline, and a return of 375 mL of dark red urine with clots was noted. Will continue to monitor.

## 2013-07-31 NOTE — Progress Notes (Signed)
Report given to Horris Latino, RN for transfer to [facility] [phone]

## 2013-07-31 NOTE — Progress Notes (Addendum)
PATIENT DETAILS Name: [name] Age: 73 y.o. Sex: male Date of Birth: 09-Apr-1941 Admit Date: 07/28/2013 Admitting Physician [name], MD [id number] Asa Lente, MD  Brief Summary: Patient is a 73 year old male with a past medical history of coronary artery disease, hypertension, dyslipidemia, chronic pain syndrome from severe arthritis on chronic narcotics (Percocet), remote history of EtOH abuse (claims to have quit more than 10 years back) who presented to the ED on 4/9 with acute urinary retention. Numerous attempts at a Foley catheter placement was made, finally a coud catheter was placed. A CT of the abdomen showed bilateral hydronephrosis, and some calcifications in the bladder. Plans were for discharge from the emergency room, however patient developed significant hematuria, and was also found to have significantly elevated LFTs. As a result patient was admitted to the hospitalist service. In regards to LFT elevation, it was thought that this may be secondary to tylenol, as the patient is on chronic Percocet therapy and finishes his 30 day supply in around 20 days. Poison control was consulted, patient received Mucomyst, LFTs and now down trended. Further workup, including a CT scan of the abdomen demonstrates a cirrhotic liver, likely secondary to remote history of alcohol use, hepatitis serology negative so far. Hospital course has been complicated by persistent hematuria, urology has been consulted, a three-way Foley catheter was placed on 4/12, continues bladder irrigation has been recommended by urology. Urology recommending transfer to [facility] for further continued care. Urology hopeful that with continuous bladder irrigation, hematuria will clear up, otherwise will need further evaluation and workup  Subjective: Mild hematuria continues  Assessment/Plan: Principal Problem:   Elevated LFTs -etiology uncertain-but some suspicion for secondary to  excessive Tylenol usage (takes percocet for arthritis and finishes 30 day supply in 20 days)-therefore started on Mucomyst protocol on admission, Poison control now recommending no further mucomyst.  LFT's slowly decreasing. Does have a hx of ETOH use, but claims to have quit >10 yrs back. -Belly is soft, CT neg for acute abnormalities-with small gallstones - Hepatitis serology negative  Active Problems: Acute Urinary Retention -Foley catheter placed in ED-traumatic insertion-suspect BPH/CT Abd showed bladder calcification-concerning for transitional cell cancer -c/w Flomax-needs outpatient follow up with Urology for voiding trial and further work up.  Hematuria -secondary to traumatic foley insertion -continue to flush catheter and monitor-However continues to have drainage of clots/hematuria, Urology evaluation completed on 4/12,  three-way Foley catheter-based, recommendations are for continuous bladder irrigation, and transferred to [facility] -c/w empiric Rocephin  CAD -cautiously c/w ASA-if hematuria persistent will stop -c/w Metoprolol, hold Niacin and statin till LFT's stable  Sinus Pause -2.5 sec pause on 4/11. Lopressor decreased to daily, no further pauses seen. Continue to monitor in telemetry  ?Liver Cirrhoses -cirrhotic liver seen on CT Abd -does have hx of ETOH use-but that was more than 10 years back, hepatitis serology serology negative -suspect further work up can be done in the outpatient setting    HYPERLIPIDEMIA -hold Niacin and statin till LFT's stable    HYPERTENSION -c/w Metoprolol, Amlodipine   Hypokalemia -likely secondary to lasix use -replete and recheck in am  Chronic Pain syndrome -claims he has severe pain in multiple joints from OA -on chronic narcotics-will stop percocet-and just continue oxycodone (without tylenol)  Hx of AAA -Chronic issue-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 amLODipine  10 mg Oral Daily  . aspirin  81 mg Oral Daily  . buPROPion  150 mg Oral Daily  . cefTRIAXone (ROCEPHIN)  IV  1 g Intravenous Q24H  . docusate sodium  100 mg Oral BID  . fluticasone  2 spray Each Nare Daily  . furosemide  40 mg Oral Daily  . guaiFENesin  1,200 mg Oral BID  . metoprolol tartrate  12.5 mg Oral Daily  . potassium chloride SA  40 mEq Oral BID  . senna  3 tablet Oral Daily  . tamsulosin  0.4 mg Oral QPC supper   Continuous Infusions: . sodium chloride 20 mL/hr (07/29/13 0132)   PRN Meds:.albuterol, diphenhydrAMINE, ondansetron (ZOFRAN) IV, ondansetron, oxyCODONE  Antibiotics: Anti-infectives   Start     Dose/Rate Route Frequency Ordered Stop   07/29/13 0800  cefTRIAXone (ROCEPHIN) 1 g in dextrose 5 % 50 mL IVPB     1 g 100 mL/hr over 30 Minutes Intravenous Every 24 hours 07/29/13 0655         PHYSICAL EXAM: Vital signs in last 24 hours: Filed Vitals:   07/30/13 1417 07/30/13 2144 07/31/13 0533 07/31/13 0912  BP: 128/62 132/68 143/70 120/70  Pulse: 62 81 63 64  Temp: 98.2 F (36.8 C) 98.2 F (36.8 C) 98 F (36.7 C)   TempSrc: Oral Oral Oral   Resp: 18 18 18    Height:      Weight:   99.066 kg (218 lb 6.4 oz)   SpO2: 97% 94% 97%     Weight change: 0 kg (0 oz) Filed Weights   07/29/13 0432 07/30/13 0613 07/31/13 0533  Weight: 97.2 kg (214 lb 4.6 oz) 99.066 kg (218 lb 6.4 oz) 99.066 kg (218 lb 6.4 oz)   Body mass index is 29.61 kg/(m^2).   Gen Exam: Awake and alert with clear speech.   Neck: Supple, No JVD.   Chest: B/L Clear.   CVS: S1 S2 Regular, no murmurs.  Abdomen: soft, BS +, non tender, non distended.  Extremities: no edema, lower extremities warm to touch. Neurologic: Non Focal.   Skin: No Rash.   Wounds: N/A.    Intake/Output from previous day:  Intake/Output  Summary (Last 24 hours) at 07/31/13 0955 Last data filed at 07/31/13 0420  Gross per 24 hour  Intake 2318.67 ml  Output   3580 ml  Net -1261.33 ml     LAB RESULTS: CBC  Recent Labs Lab 07/28/13 1603 07/29/13 0654 07/30/13 0625  WBC 5.0 8.9 6.2  HGB 13.5 12.8* 12.0*  HCT 39.3 37.6* 34.6*  PLT 165 167 142*  MCV 90.6 88.9 89.2  MCH 31.1 30.3 30.9  MCHC 34.4 34.0 34.7  RDW 14.3 14.1 14.3    Chemistries   Recent Labs Lab 07/28/13 1603 07/29/13 0654 07/29/13 1914 07/29/13 2252 07/30/13 0625 07/31/13 0703  NA 140 140 140  --  140 140  K 3.4* 2.9* 3.0* 3.0* 3.0* 3.2*  CL 99 99 100  --  102 102  CO2 21 18* 21  --  19 25  GLUCOSE 102* 129* 128*  --  95 110*  BUN 13 15 12   --  9 14  CREATININE 0.77 0.74 0.69  --  0.64 0.71  CALCIUM 9.3 9.0 8.6  --  8.6 8.6    CBG: No results found for this basename: GLUCAP,  in the last 168 hours  GFR  Estimated Creatinine Clearance: 104.8 ml/min (by C-G formula based on Cr of 0.71).  Coagulation profile  Recent Labs Lab 07/28/13 1643 07/29/13 0654 07/29/13 1914  INR 1.07 1.21 1.26    Cardiac Enzymes No results found for this basename: CK, CKMB, TROPONINI, MYOGLOBIN,  in the last 168 hours  No components found with this basename: POCBNP,  No results found for this basename: DDIMER,  in the last 72 hours No results found for this basename: HGBA1C,  in the last 72 hours No results found for this basename: CHOL, HDL, LDLCALC, TRIG, CHOLHDL, LDLDIRECT,  in the last 72 hours No results found for this basename: TSH, T4TOTAL, FREET3, T3FREE, THYROIDAB,  in the last 72 hours No results found for this basename: VITAMINB12, FOLATE, FERRITIN, TIBC, IRON, RETICCTPCT,  in the last 72 hours  Recent Labs  07/28/13 1643  LIPASE 26    Urine Studies No results found for this basename: UACOL, UAPR, USPG, UPH, UTP, UGL, UKET, UBIL, UHGB, UNIT, UROB, ULEU, UEPI, UWBC, URBC, UBAC, CAST, CRYS, UCOM, BILUA,  in the last 72  hours  MICROBIOLOGY: No results found for this or any previous visit (from the past 240 hour(s)).  RADIOLOGY STUDIES/RESULTS: Ct Abdomen Pelvis W Contrast  07/28/2013   CLINICAL DATA:  Unable to urinate. Abdominal discomfort and constipation.  EXAM: CT ABDOMEN AND PELVIS WITH CONTRAST  TECHNIQUE: Multidetector CT imaging of the abdomen and pelvis was performed using the standard protocol following bolus administration of intravenous contrast.  CONTRAST:  133mL OMNIPAQUE IOHEXOL 300 MG/ML  SOLN  COMPARISON:  CT ANGIO ABDOMEN W/CM &/OR WO/CM dated 10/28/2012; CT ANGIO ABDOMEN W/CM &/OR WO/CM dated 10/10/2011  FINDINGS: Lung bases show no acute findings. Heart is at the upper limits of normal in size. No pericardial or pleural effusion.  Liver margin is markedly irregular. Small stones layer in the gallbladder. Adrenal glands are unremarkable. Mild bilateral hydronephrosis. Bladder is distended and contains calcific density along the posterior wall. Foley catheter is seen within the bladder. No definite urinary stones.  Spleen, pancreas, stomach and bowel are unremarkable. Prostate is enlarged. No pathologically enlarged lymph nodes. No free fluid. Atherosclerotic calcification of the arterial vasculature. Infrarenal aorta measures up to 3.1 cm above the bifurcation. No worrisome lytic or sclerotic lesions. Advanced degenerative changes are seen in the spine.  IMPRESSION: 1. Mild bilateral hydronephrosis is likely secondary to marked bladder distention, despite indwelling Foley catheter. 2. Calcification along the posterior wall of the bladder. Transitional cell carcinoma cannot be excluded. 3. Prostate enlargement. 4. Advanced cirrhosis. 5. Small infrarenal aortic aneurysm.   Electronically Signed   By: Lorin Picket M.D.   On: 07/28/2013 19:22   US Abdomen Limited Ruq  07/28/2013   CLINICAL DATA:  Elevated LFTs.  EXAM: US ABDOMEN LIMITED - RIGHT UPPER QUADRANT  COMPARISON:  CT 10/10/2011  FINDINGS:  Gallbladder:  There is evidence of mild cholelithiasis. Gallbladder wall thickening is present measuring 4.6 mm. Negative sonographic Murphy's sign. No adjacent free fluid.  Common bile duct:  Diameter: 4.8 mm proximally and 5 mm distally.  Liver:  Somewhat small with nodular contour and coarsened echotexture compatible with cirrhosis. No definite focal mass.  IMPRESSION: Mild cholelithiasis with associated gallbladder wall thickening. No ductal dilatation. Negative sonographic Murphy sign. Recommend clinical correlation for acute cholecystitis.  Evidence of cirrhosis.   Electronically Signed   By: Marin Olp M.D.   On: 07/28/2013 18:52    Shanker Kristeen Mans, MD  [facility] Pager:[phone]  If 7PM-7AM,  please contact night-coverage [email or web address] Password [facility] 07/31/2013, 9:55 AM   LOS: 3 days

## 2013-08-01 DIAGNOSIS — D649 Anemia, unspecified: Secondary | ICD-10-CM

## 2013-08-01 DIAGNOSIS — I714 Abdominal aortic aneurysm, without rupture: Secondary | ICD-10-CM | POA: Diagnosis not present

## 2013-08-01 DIAGNOSIS — R310 Gross hematuria: Secondary | ICD-10-CM | POA: Diagnosis not present

## 2013-08-01 DIAGNOSIS — N329 Bladder disorder, unspecified: Secondary | ICD-10-CM | POA: Diagnosis not present

## 2013-08-01 DIAGNOSIS — R339 Retention of urine, unspecified: Secondary | ICD-10-CM | POA: Diagnosis not present

## 2013-08-01 DIAGNOSIS — R350 Frequency of micturition: Secondary | ICD-10-CM | POA: Diagnosis not present

## 2013-08-01 DIAGNOSIS — R7989 Other specified abnormal findings of blood chemistry: Secondary | ICD-10-CM | POA: Diagnosis not present

## 2013-08-01 LAB — COMPREHENSIVE METABOLIC PANEL
ALT: 100 U/L — ABNORMAL HIGH (ref 0–53)
AST: 56 U/L — ABNORMAL HIGH (ref 0–37)
Albumin: 3.4 g/dL — ABNORMAL LOW (ref 3.5–5.2)
Alkaline Phosphatase: 122 U/L — ABNORMAL HIGH (ref 39–117)
BUN: 11 mg/dL (ref 6–23)
CO2: 27 mEq/L (ref 19–32)
Calcium: 8.7 mg/dL (ref 8.4–10.5)
Chloride: 101 mEq/L (ref 96–112)
Creatinine, Ser: 0.68 mg/dL (ref 0.50–1.35)
GFR calc Af Amer: >90 mL/min (ref >90)
GFR calc non Af Amer: >90 mL/min (ref >90)
Glucose, Bld: 104 mg/dL — ABNORMAL HIGH (ref 70–99)
Potassium: 3.6 mEq/L — ABNORMAL LOW (ref 3.7–5.3)
Sodium: 140 mEq/L (ref 137–147)
Total Bilirubin: 1.2 mg/dL (ref 0.3–1.2)
Total Protein: 6.8 g/dL (ref 6.0–8.3)

## 2013-08-01 LAB — CBC
HCT: 33.7 % — ABNORMAL LOW (ref 39.0–52.0)
Hemoglobin: 11.5 g/dL — ABNORMAL LOW (ref 13.0–17.0)
MCH: 30.7 pg (ref 26.0–34.0)
MCHC: 34.1 g/dL (ref 30.0–36.0)
MCV: 90.1 fL (ref 78.0–100.0)
Platelets: 140 10*3/uL — ABNORMAL LOW (ref 150–400)
RBC: 3.74 MIL/uL — ABNORMAL LOW (ref 4.22–5.81)
RDW: 13.9 % (ref 11.5–15.5)
WBC: 4.7 10*3/uL (ref 4.0–10.5)

## 2013-08-01 LAB — MRSA PCR SCREENING: MRSA by PCR: NEGATIVE

## 2013-08-01 MED ORDER — OXYCODONE HCL 5 MG PO TABS
10.0000 mg | ORAL_TABLET | Freq: Once | ORAL | Status: AC
  Administered 2013-08-01: 10 mg via ORAL
  Filled 2013-08-01: qty 2

## 2013-08-01 NOTE — Progress Notes (Signed)
PATIENT DETAILS Name: JOHNN KRASOWSKI Age: 73 y.o. Sex: male Date of Birth: October 05, 1940 Admit Date: 07/28/2013 Admitting Physician [name], MD EUM:[id number] Asa Lente, MD  Brief Summary: Patient is a 73 year old male with a past medical history of coronary artery disease, hypertension, dyslipidemia, chronic pain syndrome from severe arthritis on chronic narcotics (Percocet), remote history of EtOH abuse (claims to have quit more than 10 years back) who presented to the ED on 4/9 with acute urinary retention. Numerous attempts at a Foley catheter placement was made, finally a coud catheter was placed. A CT of the abdomen showed bilateral hydronephrosis, and some calcifications in the bladder. Plans were for discharge from the emergency room, however patient developed significant hematuria, and was also found to have significantly elevated LFTs. As a result patient was admitted to the hospitalist service. In regards to LFT elevation, it was thought that this may be secondary to tylenol, as the patient is on chronic Percocet therapy and finishes his 30 day supply in around 20 days. Poison control was consulted, patient received Mucomyst, LFTs and now down trended. Further workup, including a CT scan of the abdomen demonstrates a cirrhotic liver, likely secondary to remote history of alcohol use, hepatitis serology negative so far. Hospital course has been complicated by persistent hematuria, urology has been consulted, a three-way Foley catheter was placed on 4/12, continues bladder irrigation has been recommended by urology. Urology recommending transfer to [facility] for further continued care. Urology hopeful that with continuous bladder irrigation, hematuria will clear up, otherwise will need further evaluation and workup  Subjective: Mild hematuria continues  Assessment/Plan: Principal Problem:   Elevated LFTs -etiology uncertain-but some suspicion for secondary to  excessive Tylenol usage (takes percocet for arthritis and finishes 30 day supply in 20 days)-therefore started on Mucomyst protocol on admission, Poison control now recommending no further mucomyst.  LFT's slowly decreasing. Does have a hx of ETOH use, but claims to have quit >10 yrs back. -Belly is soft, CT neg for acute abnormalities-with small gallstones - Hepatitis serology negative  Active Problems: Acute Urinary Retention -Foley catheter placed in ED-traumatic insertion-suspect BPH/CT Abd showed bladder calcification-concerning for transitional cell cancer -c/w Flomax-needs outpatient follow up with Urology for voiding trial and further work up.  Hematuria -secondary to traumatic foley insertion -continue to flush catheter and monitor-However continues to have drainage of clots/hematuria, Urology evaluation completed on 4/12,  three-way Foley catheter-based, recommendations are for continuous bladder irrigation -c/w empiric Rocephin  CAD -cautiously c/w ASA-if hematuria persistent will stop -c/w Metoprolol, hold Niacin and statin till LFT's stable  Mobitz Type 1 -occurred on 4/12-case d/w with Dr Stanford Breed over the phone, who looked over the telemetry strips, this occurred while patient was sleeping. Lopressor discontinued, no further recommendations from cardiology at this time.Monitored on Tele-no further episodes.  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 amLODipine  10 mg Oral Daily  . aspirin  81 mg Oral Daily  . buPROPion  150 mg Oral Daily  . cefTRIAXone (ROCEPHIN)  IV  1 g Intravenous Q24H  . docusate sodium  100 mg Oral BID  . finasteride  5 mg Oral Daily  . fluticasone  2 spray Each Nare Daily  . furosemide  40 mg Oral Daily  . guaiFENesin  1,200 mg Oral BID  . potassium chloride SA  40 mEq Oral BID  . senna  3 tablet Oral Daily  . tamsulosin  0.4 mg Oral QPC supper   Continuous Infusions: . sodium chloride 20 mL/hr (07/29/13 0132)  . sodium chloride irrigation     PRN Meds:.albuterol, diphenhydrAMINE, ondansetron (ZOFRAN) IV, ondansetron, oxyCODONE  Antibiotics: Anti-infectives   Start     Dose/Rate Route Frequency Ordered Stop   07/29/13 0800  cefTRIAXone (ROCEPHIN) 1 g in dextrose 5 % 50 mL IVPB     1 g 100 mL/hr over 30 Minutes Intravenous Every 24 hours 07/29/13 0655         PHYSICAL EXAM: Vital signs in last 24 hours: Filed Vitals:   08/01/13 0402 08/01/13 0500 08/01/13 0800 08/01/13 0814  BP: 111/45   143/54  Pulse: 76  69   Temp:    98.6 F (37 C)  TempSrc:    Oral  Resp: 14  21   Height:      Weight:  97.6 kg (215 lb 2.7 oz)    SpO2: 98%  100% 96%    Weight change: -1.466 kg (-3 lb 3.7 oz) Filed Weights   07/30/13 0613 07/31/13 0533 08/01/13 0500  Weight: 99.066 kg (218 lb 6.4 oz) 99.066 kg (218 lb 6.4 oz) 97.6 kg (215 lb 2.7 oz)   Body mass index is 29.18 kg/(m^2).   Gen Exam: Awake and alert with clear speech.   Neck: Supple, No JVD.   Chest: B/L Clear.   CVS: S1 S2 Regular, no murmurs.  Abdomen: soft, BS +, non tender, non distended.  Extremities: no edema, lower extremities warm to touch. Neurologic: Non  Focal.   Skin: No Rash.   Wounds: N/A.    Intake/Output from previous day:  Intake/Output Summary (Last 24 hours) at 08/01/13 0950 Last data filed at 08/01/13 0600  Gross per 24 hour  Intake  12102 ml  Output  18670 ml  Net  -6568 ml     LAB RESULTS: CBC  Recent Labs Lab 07/28/13 1603 07/29/13 0654 07/30/13 0625 08/01/13 0754  WBC 5.0 8.9 6.2 4.7  HGB 13.5 12.8* 12.0* 11.5*  HCT 39.3 37.6* 34.6* 33.7*  PLT 165 167 142* 140*  MCV 90.6 88.9 89.2 90.1  MCH 31.1 30.3 30.9 30.7  MCHC 34.4 34.0 34.7 34.1  RDW 14.3 14.1 14.3 13.9    Chemistries   Recent Labs Lab 07/29/13 0654 07/29/13 1914 07/29/13 2252 07/30/13 0625 07/31/13 0703 08/01/13 0754  NA 140 140  --  140 140 140  K 2.9* 3.0* 3.0* 3.0* 3.2* 3.6*  CL 99 100  --  102 102 101  CO2 18* 21  --  19 25 27   GLUCOSE 129* 128*  --  95 110* 104*  BUN 15 12  --  9 14 11   CREATININE 0.74 0.69  --  0.64 0.71 0.68  CALCIUM 9.0  8.6  --  8.6 8.6 8.7    CBG: No results found for this basename: GLUCAP,  in the last 168 hours  GFR Estimated Creatinine Clearance: 104 ml/min (by C-G formula based on Cr of 0.68).  Coagulation profile  Recent Labs Lab 07/28/13 1643 07/29/13 0654 07/29/13 1914  INR 1.07 1.21 1.26    Cardiac Enzymes No results found for this basename: CK, CKMB, TROPONINI, MYOGLOBIN,  in the last 168 hours  No components found with this basename: POCBNP,  No results found for this basename: DDIMER,  in the last 72 hours No results found for this basename: HGBA1C,  in the last 72 hours No results found for this basename: CHOL, HDL, LDLCALC, TRIG, CHOLHDL, LDLDIRECT,  in the last 72 hours No results found for this basename: TSH, T4TOTAL, FREET3, T3FREE, THYROIDAB,  in the last 72 hours No results found for this basename: VITAMINB12, FOLATE, FERRITIN, TIBC, IRON, RETICCTPCT,  in the last 72 hours No results found for this basename: LIPASE, AMYLASE,  in the last 72 hours  Urine Studies No results  found for this basename: UACOL, UAPR, USPG, UPH, UTP, UGL, UKET, UBIL, UHGB, UNIT, UROB, ULEU, UEPI, UWBC, URBC, UBAC, CAST, CRYS, UCOM, BILUA,  in the last 72 hours  MICROBIOLOGY: Recent Results (from the past 240 hour(s))  MRSA PCR SCREENING     Status: None   Collection Time    07/31/13 10:53 PM      Result Value Ref Range Status   MRSA by PCR NEGATIVE  NEGATIVE Final   Comment:            The GeneXpert MRSA Assay (FDA     approved for NASAL specimens     only), is one component of a     comprehensive MRSA colonization     surveillance program. It is not     intended to diagnose MRSA     infection nor to guide or     monitor treatment for     MRSA infections.    RADIOLOGY STUDIES/RESULTS: Ct Abdomen Pelvis W Contrast  07/28/2013   CLINICAL DATA:  Unable to urinate. Abdominal discomfort and constipation.  EXAM: CT ABDOMEN AND PELVIS WITH CONTRAST  TECHNIQUE: Multidetector CT imaging of the abdomen and pelvis was performed using the standard protocol following bolus administration of intravenous contrast.  CONTRAST:  OMNIPAQUE IOHEXOL 300 MG/ML  SOLN  COMPARISON:  CT ANGIO ABDOMEN W/CM &/OR WO/CM dated 10/28/2012; CT ANGIO ABDOMEN W/CM &/OR WO/CM dated 10/10/2011  FINDINGS: Lung bases show no acute findings. Heart is at the upper limits of normal in size. No pericardial or pleural effusion.  Liver margin is markedly irregular. Small stones layer in the gallbladder. Adrenal glands are unremarkable. Mild bilateral hydronephrosis. Bladder is distended and contains calcific density along the posterior wall. Foley catheter is seen within the bladder. No definite urinary stones.  Spleen, pancreas, stomach and bowel are unremarkable. Prostate is enlarged. No pathologically enlarged lymph nodes. No free fluid. Atherosclerotic calcification of the arterial vasculature. Infrarenal aorta measures up to 3.1 cm above the bifurcation. No worrisome lytic or sclerotic lesions. Advanced degenerative  changes are seen in the spine.  IMPRESSION: 1. Mild bilateral hydronephrosis is likely secondary to marked bladder distention, despite indwelling Foley catheter. 2. Calcification along the posterior wall of the bladder. Transitional cell carcinoma cannot be excluded. 3. Prostate enlargement. 4. Advanced cirrhosis. 5. Small infrarenal aortic aneurysm.   Electronically Signed   By: Leanna Battles M.D.  On: 07/28/2013 19:22   US Abdomen Limited Ruq  07/28/2013   CLINICAL DATA:  Elevated LFTs.  EXAM: US ABDOMEN LIMITED - RIGHT UPPER QUADRANT  COMPARISON:  CT 10/10/2011  FINDINGS: Gallbladder:  There is evidence of mild cholelithiasis. Gallbladder wall thickening is present measuring 4.6 mm. Negative sonographic Murphy's sign. No adjacent free fluid.  Common bile duct:  Diameter: 4.8 mm proximally and 5 mm distally.  Liver:  Somewhat small with nodular contour and coarsened echotexture compatible with cirrhosis. No definite focal mass.  IMPRESSION: Mild cholelithiasis with associated gallbladder wall thickening. No ductal dilatation. Negative sonographic Murphy sign. Recommend clinical correlation for acute cholecystitis.  Evidence of cirrhosis.   Electronically Signed   By: Marin Olp M.D.   On: 07/28/2013 18:52    Lular Letson Kristeen Mans, MD  [facility] Pager:[phone]  If 7PM-7AM, please contact night-coverage [email or web address] Password [email or web address] 08/01/2013, 9:50 AM   LOS: 4 days

## 2013-08-01 NOTE — Progress Notes (Signed)
Urology Progress Note  Subjective:     No acute urologic events overnight. Positive leg pain. Did not require hand irrigation. Still has bleeding around catheter- consistent w/ prostatic source of bleeding.  ROS: Negative: chest pain or SOB.  Objective:  Patient Vitals for the past 24 hrs:  BP Temp Temp src Pulse Resp SpO2 Weight  08/01/13 0500 - - - - - - 97.6 kg (215 lb 2.7 oz)  08/01/13 0402 111/45 mmHg - - 76 14 98 % -  08/01/13 0000 127/55 mmHg - - 73 20 98 % -  07/31/13 2327 121/96 mmHg 98.1 F (36.7 C) Oral 75 14 99 % -  07/31/13 2100 127/49 mmHg - - 66 18 91 % -  07/31/13 2000 155/47 mmHg 98.5 F (36.9 C) Oral 65 17 99 % -  07/31/13 1551 113/61 mmHg - - 68 16 99 % -  07/31/13 1402 158/70 mmHg 97.8 F (36.6 C) Oral 62 18 97 % -  07/31/13 0912 120/70 mmHg - - 64 - - -    Physical Exam: General:  No acute distress, awake Cardiovascular:    [x]   S1/S2 present, RRR  []   Irregularly irregular Chest:  CTA-B Abdomen:               []  Soft, appropriately TTP  [x]  Soft, NTTP  []  Soft, appropriately TTP, incision(s) clean/dry/intact  Genitourinary: Old dried blood around urethral meatus. Urine light peach on slow CBI drip.     I/O last 3 completed shifts: In: 16109 [P.O.:2467; UEAVW:09811; IV Piggyback:100] Out: 20150 [Urine:20150]  Recent Labs     07/30/13  0625  HGB  12.0*  WBC  6.2  PLT  142*    Recent Labs     07/30/13  0625  07/31/13  0703  NA  140  140  K  3.0*  3.2*  CL  102  102  CO2  19  25  BUN  9  14  CREATININE  0.64  0.71  CALCIUM  8.6  8.6  GFRNONAA  >90  >90  GFRAA  >90  >90     Recent Labs     07/29/13  1914  INR  1.26     No components found with this basename: ABG,     Length of stay: 4 days.  Assessment: Gross hematuria (likely catheter trauma).    Plan: -Continue catheter.  -I would recommend we keep continuous bladder irrigation as he still has some bleeding from his prostate.  -Hand flush PRN.  -Continue  finasteride & flomax.   Rolan Bucco, MD [phone]

## 2013-08-02 DIAGNOSIS — T391X1A Poisoning by 4-Aminophenol derivatives, accidental (unintentional), initial encounter: Secondary | ICD-10-CM | POA: Diagnosis not present

## 2013-08-02 DIAGNOSIS — N329 Bladder disorder, unspecified: Secondary | ICD-10-CM | POA: Diagnosis not present

## 2013-08-02 DIAGNOSIS — R310 Gross hematuria: Secondary | ICD-10-CM | POA: Diagnosis not present

## 2013-08-02 DIAGNOSIS — R339 Retention of urine, unspecified: Secondary | ICD-10-CM | POA: Diagnosis not present

## 2013-08-02 DIAGNOSIS — I10 Essential (primary) hypertension: Secondary | ICD-10-CM | POA: Diagnosis not present

## 2013-08-02 DIAGNOSIS — N320 Bladder-neck obstruction: Secondary | ICD-10-CM | POA: Diagnosis not present

## 2013-08-02 LAB — COMPREHENSIVE METABOLIC PANEL
ALT: 88 U/L — ABNORMAL HIGH (ref 0–53)
AST: 48 U/L — ABNORMAL HIGH (ref 0–37)
Albumin: 3.2 g/dL — ABNORMAL LOW (ref 3.5–5.2)
Alkaline Phosphatase: 114 U/L (ref 39–117)
BUN: 13 mg/dL (ref 6–23)
CO2: 27 mEq/L (ref 19–32)
Calcium: 8.6 mg/dL (ref 8.4–10.5)
Chloride: 97 mEq/L (ref 96–112)
Creatinine, Ser: 0.74 mg/dL (ref 0.50–1.35)
GFR calc Af Amer: >90 mL/min (ref >90)
GFR calc non Af Amer: >90 mL/min (ref >90)
Glucose, Bld: 115 mg/dL — ABNORMAL HIGH (ref 70–99)
Potassium: 3.7 mEq/L (ref 3.7–5.3)
Sodium: 137 mEq/L (ref 137–147)
Total Bilirubin: 1.1 mg/dL (ref 0.3–1.2)
Total Protein: 6.8 g/dL (ref 6.0–8.3)

## 2013-08-02 MED ORDER — OXYCODONE HCL 5 MG PO TABS: 5.0000 mg | ORAL_TABLET | Freq: Four times a day (QID) | ORAL | Status: DC | PRN

## 2013-08-02 MED ORDER — FINASTERIDE 5 MG PO TABS: 5.0000 mg | ORAL_TABLET | Freq: Every day | ORAL | Status: DC

## 2013-08-02 NOTE — Progress Notes (Addendum)
Urology Progress Note  Subjective:     No acute urologic events overnight. No cath flushing required.  ROS: Negative: chest pain or SOB.  Objective:  Patient Vitals for the past 24 hrs:  BP Temp Temp src Resp SpO2 Weight  08/02/13 0832 148/68 mmHg - - 19 - -  08/02/13 0500 - - - 18 - -  08/02/13 0410 - - - 23 - -  08/02/13 0324 148/55 mmHg 98.3 F (36.8 C) Oral 23 99 % 97.2 kg (214 lb 4.6 oz)  08/02/13 0100 - - - 16 - -  08/02/13 0019 - - - 19 - -  08/01/13 2130 - - - 16 - -  08/01/13 2042 - - - 16 - -  08/01/13 2000 - 98.1 F (36.7 C) Oral - - -  08/01/13 1926 140/67 mmHg - - - 98 % -  08/01/13 1553 149/62 mmHg 98.2 F (36.8 C) Oral - 99 % -  08/01/13 1214 100/58 mmHg 98.5 F (36.9 C) Oral - 99 % -    Physical Exam: General:  No acute distress, awake Cardiovascular:    [x]   S1/S2 present, RRR  []   Irregularly irregular Chest:  CTA-B Abdomen:               []  Soft, appropriately TTP  [x]  Soft, NTTP  []  Soft, appropriately TTP, incision(s) clean/dry/intact  Genitourinary: Urine light yellow on slow CBI drip.     I/O last 3 completed shifts: In: 43154 [P.O.:780; Other:18000; IV Piggyback:50] Out: 27950 [Urine:27950]  Recent Labs     08/01/13  0754  HGB  11.5*  WBC  4.7  PLT  140*    Recent Labs     08/01/13  0754  08/02/13  0258  NA  140  137  K  3.6*  3.7  CL  101  97  CO2  27  27  BUN  11  13  CREATININE  0.68  0.74  CALCIUM  8.7  8.6  GFRNONAA  >90  >90  GFRAA  >90  >90     No results found for this basename: PT, INR, APTT,  in the last 72 hours   No components found with this basename: ABG,     Length of stay: 5 days.  Assessment: Gross hematuria (likely catheter trauma).    Plan: -Continue catheter.  -Turn off CBI this morning. If urine stays the color of iced tea or lighter, OK to d/c home w/ cath in place.  -F/u w/ me as outpatient.  -Discharge home with the following 2 meds:  Finasteride 5 mg PO daily. Tamsulosin 0.4  mg PO QHS.  -Call with questions.    Rolan Bucco, MD [phone]

## 2013-08-02 NOTE — Care Management Note (Signed)
    Page 1 of 2   08/02/2013     1:35:12 PM   CARE MANAGEMENT NOTE 08/02/2013  Patient:  [name], [name]   Account Number:  [id number]  Date Initiated:  08/01/2013  Documentation initiated by:  Elissa Hefty  Subjective/Objective Assessment:   adm w abn labs     Action/Plan:   lives w fam, pcp dr Mateo Flow leschber   Anticipated DC Date:  08/02/2013   Anticipated DC Plan:  [place]  CM consult      PAC Choice  DURABLE MEDICAL EQUIPMENT   Choice offered to / List presented to:  C-1 Patient   DME arranged  3-N-1      DME agency  [place].        Status of service:   Medicare Important Message given?   (If response is "NO", the following Medicare IM given date fields will be blank) Date Medicare IM given:   Date Additional Medicare IM given:    Discharge Disposition:  HOME/SELF CARE  Per UR Regulation:  Reviewed for med. necessity/level of care/duration of stay  If discussed at [place] of Stay Meetings, dates discussed:   08/02/2013    Comments:  4/14 1333 debbie Bennetta Rudden rn,bsn spoke w pt. he will go home w leg bag for cath. md will follow this in office. pt's son will be w him. he does not want hhc but does want 3n1 bsc. order was placed by md. have alerted [facility] dme rep that pt for dc and need 3n1 bsc del to room prior to disch.

## 2013-08-02 NOTE — Progress Notes (Signed)
Pt being discharged home with son. Pt discharge instructions reviewed with patient, answered all questions. Care plan and education complete.Pt being discharged with Coude Catheter, reviewed foley care and maintance in depth with patient. Removed IV. Discharge instructions signed and placed in chart.

## 2013-08-02 NOTE — Discharge Summary (Signed)
PATIENT DETAILS Name: [name] Age: 73 y.o. Sex: male Date of Birth: 1940/07/28 MRN: [id number]. Admit Date: 07/28/2013 Admitting Physician: [name], MD WUJ:[id number] Asa Lente, MD  Recommendations for Outpatient Follow-up:  1. Please resume statins and niacin once LFTs normalize. Check LFT's next visit. 2. Off beta blocker completely-patient had Mobitz type I during this hospital stay. Avoid AV nodal blocking agents. 3. Needs urology workup including cystoscopy in the outpatient setting 4. Will be discharged with Foley catheter in place, keep in place till seen by urology. 5. Cirrhotic liver seen on CT abdomen, please initiate further workup in the outpatient setting. 6.    Had elevated LFT's likely secondary to Percocet-now on Oxycodone.   PRIMARY DISCHARGE DIAGNOSIS:  Principal Problem:   Elevated LFTs Active Problems:   HYPERLIPIDEMIA   HYPERTENSION   Bladder outlet obstruction      PAST MEDICAL HISTORY: Past Medical History  Diagnosis Date  . 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 COPD (chronic obstructive pulmonary disease)   . Cerebrovascular disease   . AAA (abdominal aortic aneurysm)   . 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 Hyperlipidemia   . HTN (hypertension)   . CONGENITAL UNSPEC REDUCTION DEFORMITY LOWER LIMB   . TOBACCO USE, QUIT   . CHF (congestive heart failure)     DISCHARGE MEDICATIONS:   Medication List    STOP taking these medications       atorvastatin 40 MG tablet  Commonly known as:  LIPITOR     metoprolol tartrate 25 MG tablet  Commonly known as:  LOPRESSOR     niacin 500 MG tablet      TAKE these medications       acetaminophen 500 MG tablet  Commonly known as:  TYLENOL  Take 500 mg by mouth every 6 (six) hours as needed. For pain     albuterol 108 (90 BASE) MCG/ACT inhaler  Commonly known as:  PROVENTIL HFA;VENTOLIN HFA  Inhale into the lungs every 6 (six) hours as needed for  wheezing or shortness of breath.     amLODipine 10 MG tablet  Commonly known as:  NORVASC  Take 10 mg by mouth daily.     aspirin 81 MG tablet  Take 81 mg by mouth daily.     buPROPion 150 MG 24 hr tablet  Commonly known as:  WELLBUTRIN XL  Take 150 mg by mouth daily.     diphenhydrAMINE 25 mg capsule  Commonly known as:  BENADRYL  Take 25 mg by mouth every 6 (six) hours as needed. For itch/rash     finasteride 5 MG tablet  Commonly known as:  PROSCAR  Take 1 tablet (5 mg total) by mouth daily.     furosemide 40 MG tablet  Commonly known as:  LASIX  Take 40 mg by mouth daily.     guaiFENesin 600 MG 12 hr tablet  Commonly known as:  MUCINEX  Take 1,200 mg by mouth 2 (two) times daily.     mometasone 50 MCG/ACT nasal spray  Commonly known as:  NASONEX  Place 2 sprays into the nose daily.     oxyCODONE 5 MG immediate release tablet  Commonly known as:  Oxy IR/ROXICODONE  Take 1 tablet (5 mg total) by mouth every 6 (six) hours as needed for moderate pain.     potassium chloride SA 20 MEQ tablet  Commonly known as:  K-DUR,KLOR-CON  Take 20  mEq by mouth 2 (two) times daily.     Sennosides 25 MG Tabs  Take 25 mg by mouth daily.     STOOL SOFTENER 100 MG capsule  Generic drug:  docusate sodium  Take 100 mg by mouth 2 (two) times daily.     tamsulosin 0.4 MG Caps capsule  Commonly known as:  FLOMAX  Take 1 capsule (0.4 mg total) by mouth daily after supper.        ALLERGIES:   Allergies  Allergen Reactions  . Hydrocodone     REACTION: Rash    BRIEF HPI:  See H&P, Labs, Consult and Test reports for all details in brief, patient was admitted for acute urinary retention. He was found to have elevated LFTs, and admitted for further workup and evaluation.   CONSULTATIONS:   urology  PERTINENT RADIOLOGIC STUDIES: Nm Hepatobiliary Liver Func  07/29/2013   CLINICAL DATA:  Gallstones.  Abdominal pain.  EXAM: NUCLEAR MEDICINE HEPATOBILIARY IMAGING  TECHNIQUE:  Sequential images of the abdomen were obtained out to 60 minutes following intravenous administration of radiopharmaceutical.  RADIOPHARMACEUTICALS:  5.0 MCi Tc-36m Choletec  COMPARISON:  CT and ultrasound, 07/28/2013  FINDINGS: There is prompt and homogeneous accumulation of radiotracer by the liver. There is prompt filling of the intra and extrahepatic biliary tree with early activity seen in the gallbladder and small bowel indicating patency of the cystic duct and common bile duct respectively.  IMPRESSION: Normal hepatobiliary imaging.  Patent cystic and common bile duct.   Electronically Signed   By: Lajean Manes M.D.   On: 07/29/2013 13:49   Ct Abdomen Pelvis W Contrast  07/28/2013   CLINICAL DATA:  Unable to urinate. Abdominal discomfort and constipation.  EXAM: CT ABDOMEN AND PELVIS WITH CONTRAST  TECHNIQUE: Multidetector CT imaging of the abdomen and pelvis was performed using the standard protocol following bolus administration of intravenous contrast.  CONTRAST:  187mL OMNIPAQUE IOHEXOL 300 MG/ML  SOLN  COMPARISON:  CT ANGIO ABDOMEN W/CM &/OR WO/CM dated 10/28/2012; CT ANGIO ABDOMEN W/CM &/OR WO/CM dated 10/10/2011  FINDINGS: Lung bases show no acute findings. Heart is at the upper limits of normal in size. No pericardial or pleural effusion.  Liver margin is markedly irregular. Small stones layer in the gallbladder. Adrenal glands are unremarkable. Mild bilateral hydronephrosis. Bladder is distended and contains calcific density along the posterior wall. Foley catheter is seen within the bladder. No definite urinary stones.  Spleen, pancreas, stomach and bowel are unremarkable. Prostate is enlarged. No pathologically enlarged lymph nodes. No free fluid. Atherosclerotic calcification of the arterial vasculature. Infrarenal aorta measures up to 3.1 cm above the bifurcation. No worrisome lytic or sclerotic lesions. Advanced degenerative changes are seen in the spine.  IMPRESSION: 1. Mild bilateral  hydronephrosis is likely secondary to marked bladder distention, despite indwelling Foley catheter. 2. Calcification along the posterior wall of the bladder. Transitional cell carcinoma cannot be excluded. 3. Prostate enlargement. 4. Advanced cirrhosis. 5. Small infrarenal aortic aneurysm.   Electronically Signed   By: Lorin Picket M.D.   On: 07/28/2013 19:22   US Abdomen Limited Ruq  07/28/2013   CLINICAL DATA:  Elevated LFTs.  EXAM: US ABDOMEN LIMITED - RIGHT UPPER QUADRANT  COMPARISON:  CT 10/10/2011  FINDINGS: Gallbladder:  There is evidence of mild cholelithiasis. Gallbladder wall thickening is present measuring 4.6 mm. Negative sonographic Murphy's sign. No adjacent free fluid.  Common bile duct:  Diameter: 4.8 mm proximally and 5 mm distally.  Liver:  Somewhat small with  nodular contour and coarsened echotexture compatible with cirrhosis. No definite focal mass.  IMPRESSION: Mild cholelithiasis with associated gallbladder wall thickening. No ductal dilatation. Negative sonographic Murphy sign. Recommend clinical correlation for acute cholecystitis.  Evidence of cirrhosis.   Electronically Signed   By: Marin Olp M.D.   On: 07/28/2013 18:52     PERTINENT LAB RESULTS: CBC:  Recent Labs  08/01/13 0754  WBC 4.7  HGB 11.5*  HCT 33.7*  PLT 140*   CMET CMP     Component Value Date/Time   NA 137 08/02/2013 0258   K 3.7 08/02/2013 0258   CL 97 08/02/2013 0258   CO2 27 08/02/2013 0258   GLUCOSE 115* 08/02/2013 0258   BUN 13 08/02/2013 0258   CREATININE 0.74 08/02/2013 0258   CALCIUM 8.6 08/02/2013 0258   PROT 6.8 08/02/2013 0258   ALBUMIN 3.2* 08/02/2013 0258   AST 48* 08/02/2013 0258   ALT 88* 08/02/2013 0258   ALKPHOS 114 08/02/2013 0258   BILITOT 1.1 08/02/2013 0258   GFRNONAA >90 08/02/2013 0258   GFRAA >90 08/02/2013 0258    GFR Estimated Creatinine Clearance: 103.8 ml/min (by C-G formula based on Cr of 0.74). No results found for this basename: LIPASE, AMYLASE,  in the last 72  hours No results found for this basename: CKTOTAL, CKMB, CKMBINDEX, TROPONINI,  in the last 72 hours No components found with this basename: POCBNP,  No results found for this basename: DDIMER,  in the last 72 hours No results found for this basename: HGBA1C,  in the last 72 hours No results found for this basename: CHOL, HDL, LDLCALC, TRIG, CHOLHDL, LDLDIRECT,  in the last 72 hours No results found for this basename: TSH, T4TOTAL, FREET3, T3FREE, THYROIDAB,  in the last 72 hours No results found for this basename: VITAMINB12, FOLATE, FERRITIN, TIBC, IRON, RETICCTPCT,  in the last 72 hours Coags: No results found for this basename: PT, INR,  in the last 72 hours Microbiology: Recent Results (from the past 240 hour(s))  MRSA PCR SCREENING     Status: None   Collection Time    07/31/13 10:53 PM      Result Value Ref Range Status   MRSA by PCR NEGATIVE  NEGATIVE Final   Comment:            The GeneXpert MRSA Assay (FDA     approved for NASAL specimens     only), is one component of a     comprehensive MRSA colonization     surveillance program. It is not     intended to diagnose MRSA     infection nor to guide or     monitor treatment for     MRSA infections.     BRIEF HOSPITAL COURSE:  Elevated LFTs  -etiology uncertain-but some suspicion for secondary to excessive Tylenol usage (takes percocet for arthritis and finishes 30 day supply in 20 days)-therefore started on Mucomyst protocol on admission, Poison control now recommending no further mucomyst. LFT's slowly decreasing. Does have a hx of ETOH use, but claims to have quit >10 yrs back.  -Belly is soft, CT neg for acute abnormalities-with small gallstones  - Hepatitis serology negative  - Patient has been asked to avoid excessive Tylenol usage, and to start any other medications without speaking to PCP.  Active Problems:  Acute Urinary Retention  -Foley catheter placed in ED-traumatic insertion-suspect BPH/CT Abd showed  bladder calcification-concerning for transitional cell cancer  -c/w Flomax and finasteride -needs outpatient follow  up with Urology for voiding trial and further work up. Patient has been asked to call [facility] and make an appointment with Dr. Jasmine December.  Hematuria  -secondary to traumatic foley insertion  - This was managed with intermittent catheter flushes, however continued to have persistent hematuria, urology consultation obtained, three-way Foley catheter was placed, continuous bladder irrigation done. Urine has subsequently cleared, patient has been cleared by urology for discharge. Further workup including cystoscopy would be done in the outpatient setting by Dr. Jasmine December. The patient was empirically covered with Rocephin during the hospital stay.  CAD  -cautiously c/w ASA-if hematuria persistent will stop  -c/w Metoprolol, hold Niacin and statin till LFT's stable   Mobitz Type 1  -occurred on 4/12-case d/w with Dr Stanford Breed over the phone, who looked over the telemetry strips, this occurred while patient was sleeping. Lopressor discontinued, no further recommendations from cardiology at this time.Monitored on Tele-no further episodes.   ?Liver Cirrhoses  -cirrhotic liver seen on CT Abd  -does have hx of ETOH use-but that was more than 10 years back, hepatitis serology serology negative  -suspect further work up can be done in the outpatient setting   HYPERLIPIDEMIA  -hold Niacin and statin till LFT's stable   HYPERTENSION  -c/w Amlodipine.  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   Objective:   Blood pressure 137/57, pulse 69, temperature 98.1 F (36.7 C), temperature source Oral, resp. rate 17, height 6' (1.829 m), weight 97.2 kg (214 lb 4.6 oz), SpO2 99.00%.  Intake/Output Summary (Last 24 hours) at 08/02/13 1247 Last data filed at 08/02/13 1000  Gross per 24 hour  Intake  12100 ml  Output  16000 ml  Net  -3900 ml   Filed Weights   07/31/13 0533 08/01/13 0500 08/02/13 0324  Weight: 99.066 kg (218 lb 6.4 oz) 97.6 kg (215 lb 2.7 oz) 97.2 kg (214 lb 4.6 oz)    Exam Awake Alert, Oriented *3, No new F.N deficits, Normal affect [place].AT,PERRAL Supple Neck,No JVD, No cervical lymphadenopathy appriciated.  Symmetrical Chest wall movement, Good air movement bilaterally, CTAB RRR,No Gallops,Rubs or new Murmurs, No Parasternal Heave +ve B.Sounds, Abd Soft, Non tender, No organomegaly appriciated, No rebound -guarding or rigidity. No Cyanosis, Clubbing or edema, No new Rash or bruise  DISCHARGE CONDITION: Stable  DISPOSITION: Home  DISCHARGE INSTRUCTIONS:    Activity:  As tolerated  Diet recommendation: Heart Healthy diet  Discharge Orders   Future Appointments Provider Department Dept Phone   08/17/2013 11:25 AM Liliane Shi, PA-C [place] [facility] [phone]   Future Orders Complete By Expires   Call MD for:  redness, tenderness, or signs of infection (pain, swelling, redness, odor or green/yellow discharge around incision site)  As directed    Call MD for:  severe uncontrolled pain  As directed    Diet - low sodium heart healthy  As directed    Increase activity slowly  As directed       Follow-up Information   Call Molli Hazard, MD.   Specialty:  Urology  Contact information:   [facility]  [place] [address] [phone]       Follow up with Richardson Dopp, PA-C On 08/17/2013. (appt 11:25 am)    Specialty:  Physician Assistant    Contact information:   [id number] [address] [phone]       Follow up with Gwendolyn Grant, MD On 08/16/2013. (appt 9 am)    Specialty:  Internal Medicine   Contact information:   [address] [place]  [address] [phone]       Total Time spent on discharge equals 45 minutes.  Signed: Jonetta Osgood 08/02/2013 12:47 PM

## 2013-08-03 ENCOUNTER — Telehealth: Payer: Self-pay

## 2013-08-03 MED ORDER — TRAMADOL HCL 50 MG PO TABS: 50.0000 mg | ORAL_TABLET | Freq: Three times a day (TID) | ORAL | Status: DC | PRN

## 2013-08-03 NOTE — Telephone Encounter (Signed)
Oxycodone can make his bladder/prostate problems worse (also note he was given rx for #30 oxy IR 5mg  by hospital doc this week) Try tramadol - rx faxed

## 2013-08-03 NOTE — Telephone Encounter (Signed)
Left msg on vm stating he was release from hospital having prostate/bladder problem. Having to use catheter and he states he is in a lot of discomfort. Wanting to see would be refill pain med. Can no longer take the acetaminophen...[name]

## 2013-08-03 NOTE — Telephone Encounter (Signed)
The patient called and is hoping to get something called in for pain.  He states he needs oxycodone.  He also mentioned he is unable to come to the office to pick this medicine due to his recent hospitalization   Callback - [phone]

## 2013-08-04 NOTE — Telephone Encounter (Signed)
Same answer - sorry, nothing else from me right now as pt just got oxy 5mg  from hosp doc

## 2013-08-04 NOTE — Telephone Encounter (Signed)
Called pt gave md response. Pt states he has tried tramadol in the past does not due anything for him. He states the hospital gave him oxycodone while he was there. Requesting something else beside tramadol...[name]

## 2013-08-04 NOTE — Telephone Encounter (Signed)
Notified pt with md response. [facility].../lmb

## 2013-08-04 NOTE — Telephone Encounter (Signed)
Patient called back, he is going to try the tramadol.  I instructed that from what I can see, it looks like it was sent to [facility].  He is going to to check with them and start with that.   Thanks!

## 2013-08-05 ENCOUNTER — Telehealth: Payer: Self-pay | Admitting: Internal Medicine

## 2013-08-05 NOTE — Telephone Encounter (Signed)
Patient states that the Tramadol 50mg  is not touching his pain. He would like to know if he can take two at one time to see if 100mg  will help him with his pain or if something else can be sent to his pharmacy. Please advise.

## 2013-08-06 ENCOUNTER — Telehealth: Payer: Self-pay | Admitting: Internal Medicine

## 2013-08-06 ENCOUNTER — Encounter (HOSPITAL_COMMUNITY): Payer: Self-pay | Admitting: Emergency Medicine

## 2013-08-06 ENCOUNTER — Emergency Department (INDEPENDENT_AMBULATORY_CARE_PROVIDER_SITE_OTHER)
Admission: EM | Admit: 2013-08-06 | Discharge: 2013-08-06 | Disposition: A | Discharge status: Home / Self Care | Payer: Medicare Other | Source: Home / Self Care

## 2013-08-06 DIAGNOSIS — G8929 Other chronic pain: Secondary | ICD-10-CM

## 2013-08-06 DIAGNOSIS — M549 Dorsalgia, unspecified: Secondary | ICD-10-CM

## 2013-08-06 DIAGNOSIS — R339 Retention of urine, unspecified: Secondary | ICD-10-CM

## 2013-08-06 MED ORDER — OXYCODONE HCL 5 MG PO TABA: 7.5000 mg | ORAL_TABLET | Freq: Four times a day (QID) | ORAL | Status: DC | PRN

## 2013-08-06 NOTE — Telephone Encounter (Signed)
Patient was in the hospital and they had him on oxycodone for his pain.  When he saw Dr Asa Lente she discontinued the oxy and put him on Tramadol.  The tramadol is not helping and he is in a great deal of pain.

## 2013-08-06 NOTE — ED Provider Notes (Signed)
Chief Complaint   Chief Complaint  Patient presents with  . Back Pain    History of Present Illness   [name] is a 73 year old male who was recently hospitalized at [facility] because of urinary retention. He has had a long-standing history of pain in his back, arms, and legs. 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 It's gotten worse since his coronary artery bypass surgery in 2010. The patient had been seeing Dr. Asa Lente. She had prescribed hydrocodone, but this caused him to itch. She then tried tramadol, but this didn't work at all. He had been taking Percocet, but when he was in the hospital it was found that his liver functions were elevated. It was felt that this was due to acetaminophen toxicity. He was given Mucomyst and told not to take any further Tylenol. When he was in the hospital he was given plain oxycodone and this worked well. His dosage was 7.5 mg every 6 hours. He just ran out a couple days ago and hasn't been able to get up with Dr. Asa Lente get the prescription refilled. He still has a catheter in place and plans to followup with Dr. Jasmine December. The catheter is somewhat irritating to his leg.  Review of Systems   Other than as noted above, the patient denies any of the following symptoms: Systemic:  No fever, chills, or unexplained weight loss. GI:  No abdominal painor incontinence of bowel. GU:  No dysuria, frequency, urgency, or hematuria. No incontinence of urine or urinary retention.  M-S:  No neck pain or arthritis. Neuro:  No paresthesias, headache, saddle anesthesia, muscular weakness, or progressive neurological deficit.  [place]   Past medical history, family history, social history, meds, and allergies were reviewed. He has a history of hyperlipidemia, anemia, hypertension, coronary artery disease, atrial fibrillation, COPD, chronic back pain, abdominal aortic aneurysm, and elevated liver function tests. Current medications include  albuterol, Norvasc, aspirin, bupropion, docusate, Proscar, Lasix, Nasonex, potassium chloride, and Flomax.  Physical Examination    Vital signs:  BP 150/73  Pulse 83  Temp(Src) 98.2 F (36.8 C) (Oral)  Resp 25  SpO2 97% General:  Alert, oriented, in no distress. Abdomen:  Soft, non-tender.  No organomegaly or mass.  No pulsatile midline abdominal mass or bruit. Back:  Diffusely tender to palpation. Has very little range of motion with pain. Neuro:  Normal muscle strength, sensations and DTRs. Extremities: Pedal pulses were full, there was no edema. Skin:  Clear, warm and dry.  No rash.   Course in [facility] [place]   His catheter was cleaned up, he had plastic clip on his leg that was irritating him, so this was removed. Back was placed, and thereafter he felt a lot more comfortable.    Assessment   The primary encounter diagnosis was Chronic back pain. A diagnosis of Urinary retention was also pertinent to this visit.  Explained to him that we do not treat chronic pain. I will give him a small number of oxycodone to last until he can get in to see Dr. Asa Lente, but we would not under any circumstances give him any further prescriptions for oxycodone.  Plan     1.  Meds:  The following meds were prescribed:   Discharge Medication List as of 08/06/2013  6:02 PM    START taking these medications   Details  OxyCODONE HCl, Abuse Deter, 5 MG TABA Take 7.5 mg by mouth every 6 (six) hours as needed., Starting 08/06/2013, Until  Discontinued, Normal        2.  Patient Education/Counseling:  The patient was given appropriate handouts, self care instructions, and instructed in symptomatic relief. The patient was encouraged to try to be as active as possible and given some exercises to do followed by moist heat.  3.  Follow up:  The patient was told to follow up here if no better in 3 to 4 days, or sooner if becoming worse in any way, and given some red flag symptoms such as worsening  pain or new neurological symptoms which would prompt immediate return.  Follow up with Dr. Asa Lente earlier next week.     Harden Mo, MD 08/06/13 [phone]

## 2013-08-06 NOTE — ED Notes (Signed)
Pt    States    The  Plastic  Clip  On his  Foley  Leg  Bag  Was  Causing  Him pain   -  Old  Blood  Noted  Perineal  Area      Pt  Cleaned  Up     New  Foley  Leg  Bag  Applied    And  Taped  Down  On  Leg  With  Instructions       pt  And  Family   Member

## 2013-08-06 NOTE — Telephone Encounter (Signed)
Left detailed message on patient voicemail. 

## 2013-08-06 NOTE — Telephone Encounter (Signed)
Please advise 

## 2013-08-06 NOTE — ED Notes (Signed)
Pt  Reports    Severe  Upper back pain  Arms  And  Legs   As  Well  that  He  Reports    For  sev  Days     Also  Wants  Catheter  Checked   Pt    Seen  9  Days  Ago   For prostate   Blockage  He  Reports  Has  appt  With  Urologist     In 5  Days      Pt  Has  Indwelling  Foley  And  A  Leg  Bag

## 2013-08-06 NOTE — Telephone Encounter (Signed)
Reviewed notes and chart.  Looks like he just received oxy from hospital and PCP denied further oxy.  Try taking two 50 mg tablets twice daily of tramadol to see if that helps.  Follow up with PCP on Monday.

## 2013-08-06 NOTE — Discharge Instructions (Signed)
Back Pain, Adult Low back pain is very common. About 1 in 5 people have back pain.The cause of low back pain is rarely dangerous. The pain often gets better over time.About half of people with a sudden onset of back pain feel better in just 2 weeks. About 8 in 10 people feel better by 6 weeks.  CAUSES Some common causes of back pain include:  Strain of the muscles or ligaments supporting the spine.  Wear and tear (degeneration) of the spinal discs.  Arthritis.  Direct injury to the back. DIAGNOSIS Most of the time, the direct cause of low back pain is not known.However, back pain can be treated effectively even when the exact cause of the pain is unknown.Answering your caregiver's questions about your overall health and symptoms is one of the most accurate ways to make sure the cause of your pain is not dangerous. If your caregiver needs more information, he or she may order lab work or imaging tests (X-rays or MRIs).However, even if imaging tests show changes in your back, this usually does not require surgery. HOME CARE INSTRUCTIONS For many people, back pain returns.Since low back pain is rarely dangerous, it is often a condition that people can learn to manageon their own.   Remain active. It is stressful on the back to sit or stand in one place. Do not sit, drive, or stand in one place for more than 30 minutes at a time. Take short walks on level surfaces as soon as pain allows.Try to increase the length of time you walk each day.  Do not stay in bed.Resting more than 1 or 2 days can delay your recovery.  Do not avoid exercise or work.Your body is made to move.It is not dangerous to be active, even though your back may hurt.Your back will likely heal faster if you return to being active before your pain is gone.  Pay attention to your body when you bend and lift. Many people have less discomfortwhen lifting if they bend their knees, keep the load close to their bodies,and  avoid twisting. Often, the most comfortable positions are those that put less stress on your recovering back.  Find a comfortable position to sleep. Use a firm mattress and lie on your side with your knees slightly bent. If you lie on your back, put a pillow under your knees.  Only take over-the-counter or prescription medicines as directed by your caregiver. Over-the-counter medicines to reduce pain and inflammation are often the most helpful.Your caregiver may prescribe muscle relaxant drugs.These medicines help dull your pain so you can more quickly return to your normal activities and healthy exercise.  Put ice on the injured area.  Put ice in a plastic bag.  Place a towel between your skin and the bag.  Leave the ice on for 15-20 minutes, 03-04 times a day for the first 2 to 3 days. After that, ice and heat may be alternated to reduce pain and spasms.  Ask your caregiver about trying back exercises and gentle massage. This may be of some benefit.  Avoid feeling anxious or stressed.Stress increases muscle tension and can worsen back pain.It is important to recognize when you are anxious or stressed and learn ways to manage it.Exercise is a great option. SEEK MEDICAL CARE IF:  You have pain that is not relieved with rest or medicine.  You have pain that does not improve in 1 week.  You have new symptoms.  You are generally not feeling well. SEEK   IMMEDIATE MEDICAL CARE IF:   You have pain that radiates from your back into your legs.  You develop new bowel or bladder control problems.  You have unusual weakness or numbness in your arms or legs.  You develop nausea or vomiting.  You develop abdominal pain.  You feel faint. Document Released: 04/07/2005 Document Revised: 10/07/2011 Document Reviewed: 08/26/2010 Exit[employer] Patient Information 2014 [employer].  

## 2013-08-08 MED ORDER — OXYCODONE HCL 5 MG PO TABS: 5.0000 mg | ORAL_TABLET | Freq: Four times a day (QID) | ORAL | Status: DC | PRN

## 2013-08-08 NOTE — Telephone Encounter (Signed)
Ok - #120 oxyIR 5mg  tabs - max #120/month - rx ready for pick up

## 2013-08-08 NOTE — Telephone Encounter (Signed)
Notified pt rx ready for pick-up.../lmb 

## 2013-08-08 NOTE — Telephone Encounter (Signed)
Ok for 100mg  q6h prn - max 8 tabs in 24h

## 2013-08-08 NOTE — Telephone Encounter (Signed)
Notified pt with md response. Pt states he started taking 100 mg of the tramadol. Medicine didn't do nothing ended up on sat going to [place] [facility]. The md their examine me & stated it was ok for me to take the plain oxycodone . He rx some but only have 3 pills left. Has an appt schedule for this Thursday to see urologist, but the pain in unbearable. Pt wanting oxycodone instead of tramadol...[name]

## 2013-08-11 DIAGNOSIS — R339 Retention of urine, unspecified: Secondary | ICD-10-CM | POA: Diagnosis not present

## 2013-08-11 DIAGNOSIS — N210 Calculus in bladder: Secondary | ICD-10-CM | POA: Diagnosis not present

## 2013-08-11 DIAGNOSIS — R310 Gross hematuria: Secondary | ICD-10-CM | POA: Diagnosis not present

## 2013-08-11 DIAGNOSIS — N3289 Other specified disorders of bladder: Secondary | ICD-10-CM | POA: Diagnosis not present

## 2013-08-12 ENCOUNTER — Telehealth: Payer: Self-pay | Admitting: *Deleted

## 2013-08-12 MED ORDER — FLUTICASONE PROPIONATE 50 MCG/ACT NA SUSP: 1.0000 | Freq: Every morning | NASAL | Status: DC

## 2013-08-12 NOTE — Telephone Encounter (Signed)
Received fax insurance will not cover nasonex. Plan recommend generic flonase...[name]

## 2013-08-16 ENCOUNTER — Ambulatory Visit: Payer: Medicare Other | Admitting: Internal Medicine

## 2013-08-17 ENCOUNTER — Encounter: Payer: Medicare Other | Admitting: Physician Assistant

## 2013-08-18 ENCOUNTER — Encounter: Payer: Medicare Other | Admitting: Physician Assistant

## 2013-08-25 DIAGNOSIS — R310 Gross hematuria: Secondary | ICD-10-CM | POA: Diagnosis not present

## 2013-08-25 DIAGNOSIS — N3289 Other specified disorders of bladder: Secondary | ICD-10-CM | POA: Diagnosis not present

## 2013-08-25 DIAGNOSIS — N401 Enlarged prostate with lower urinary tract symptoms: Secondary | ICD-10-CM | POA: Diagnosis not present

## 2013-08-25 DIAGNOSIS — N138 Other obstructive and reflux uropathy: Secondary | ICD-10-CM | POA: Diagnosis not present

## 2013-08-25 DIAGNOSIS — R339 Retention of urine, unspecified: Secondary | ICD-10-CM | POA: Diagnosis not present

## 2013-08-26 ENCOUNTER — Encounter: Payer: Self-pay | Admitting: Internal Medicine

## 2013-08-26 ENCOUNTER — Other Ambulatory Visit (INDEPENDENT_AMBULATORY_CARE_PROVIDER_SITE_OTHER): Payer: Medicare Other

## 2013-08-26 ENCOUNTER — Ambulatory Visit (INDEPENDENT_AMBULATORY_CARE_PROVIDER_SITE_OTHER): Payer: Medicare Other | Admitting: Internal Medicine

## 2013-08-26 VITALS — BP 130/90 | HR 89 | Temp 97.9°F | Ht 72.0 in | Wt 210.0 lb

## 2013-08-26 DIAGNOSIS — N320 Bladder-neck obstruction: Secondary | ICD-10-CM

## 2013-08-26 DIAGNOSIS — R945 Abnormal results of liver function studies: Secondary | ICD-10-CM

## 2013-08-26 DIAGNOSIS — R339 Retention of urine, unspecified: Secondary | ICD-10-CM | POA: Diagnosis not present

## 2013-08-26 DIAGNOSIS — K7460 Unspecified cirrhosis of liver: Secondary | ICD-10-CM

## 2013-08-26 DIAGNOSIS — M549 Dorsalgia, unspecified: Secondary | ICD-10-CM

## 2013-08-26 DIAGNOSIS — I714 Abdominal aortic aneurysm, without rupture, unspecified: Secondary | ICD-10-CM

## 2013-08-26 DIAGNOSIS — E785 Hyperlipidemia, unspecified: Secondary | ICD-10-CM

## 2013-08-26 DIAGNOSIS — R7989 Other specified abnormal findings of blood chemistry: Secondary | ICD-10-CM

## 2013-08-26 DIAGNOSIS — I2510 Atherosclerotic heart disease of native coronary artery without angina pectoris: Secondary | ICD-10-CM | POA: Diagnosis not present

## 2013-08-26 LAB — BASIC METABOLIC PANEL
BUN: 17 mg/dL (ref 6–23)
CO2: 27 mEq/L (ref 19–32)
Calcium: 9 mg/dL (ref 8.4–10.5)
Chloride: 104 mEq/L (ref 96–112)
Creatinine, Ser: 0.8 mg/dL (ref 0.4–1.5)
GFR: 95.79 mL/min (ref >60.00)
Glucose, Bld: 119 mg/dL — ABNORMAL HIGH (ref 70–99)
Potassium: 4.3 mEq/L (ref 3.5–5.1)
Sodium: 139 mEq/L (ref 135–145)

## 2013-08-26 LAB — CBC WITH DIFFERENTIAL/PLATELET
Basophils Absolute: 0.1 10*3/uL (ref 0.0–0.1)
Basophils Relative: 0.7 % (ref 0.0–3.0)
Eosinophils Absolute: 0.4 10*3/uL (ref 0.0–0.7)
Eosinophils Relative: 5.8 % — ABNORMAL HIGH (ref 0.0–5.0)
HCT: 37.4 % — ABNORMAL LOW (ref 39.0–52.0)
Hemoglobin: 12.6 g/dL — ABNORMAL LOW (ref 13.0–17.0)
Lymphocytes Relative: 16.7 % (ref 12.0–46.0)
Lymphs Abs: 1.2 10*3/uL (ref 0.7–4.0)
MCHC: 33.8 g/dL (ref 30.0–36.0)
MCV: 88.2 fl (ref 78.0–100.0)
Monocytes Absolute: 0.5 10*3/uL (ref 0.1–1.0)
Monocytes Relative: 7.4 % (ref 3.0–12.0)
Neutro Abs: 5.1 10*3/uL (ref 1.4–7.7)
Neutrophils Relative %: 69.4 % (ref 43.0–77.0)
Platelets: 189 10*3/uL (ref 150.0–400.0)
RBC: 4.24 Mil/uL (ref 4.22–5.81)
RDW: 14.5 % (ref 11.5–15.5)
WBC: 7.3 10*3/uL (ref 4.0–10.5)

## 2013-08-26 LAB — HEPATIC FUNCTION PANEL
ALT: 49 U/L (ref 0–53)
AST: 45 U/L — ABNORMAL HIGH (ref 0–37)
Albumin: 3.8 g/dL (ref 3.5–5.2)
Alkaline Phosphatase: 91 U/L (ref 39–117)
Bilirubin, Direct: 0.2 mg/dL (ref 0.0–0.3)
Total Bilirubin: 1.5 mg/dL — ABNORMAL HIGH (ref 0.2–1.2)
Total Protein: 7.5 g/dL (ref 6.0–8.3)

## 2013-08-26 LAB — PROTIME-INR
INR: 1.2 ratio — ABNORMAL HIGH (ref 0.8–1.0)
Prothrombin Time: 13.4 s — ABNORMAL HIGH (ref 9.6–13.1)

## 2013-08-26 MED ORDER — ATORVASTATIN CALCIUM 40 MG PO TABS: 40.0000 mg | ORAL_TABLET | Freq: Every day | ORAL | Status: DC

## 2013-08-26 MED ORDER — OXYCODONE HCL 5 MG PO TABS: 7.5000 mg | ORAL_TABLET | Freq: Four times a day (QID) | ORAL | Status: DC | PRN

## 2013-08-26 MED ORDER — TAMSULOSIN HCL 0.4 MG PO CAPS: 0.4000 mg | ORAL_CAPSULE | Freq: Every day | ORAL | Status: DC

## 2013-08-26 MED ORDER — FINASTERIDE 5 MG PO TABS: 5.0000 mg | ORAL_TABLET | Freq: Every day | ORAL | Status: DC

## 2013-08-26 NOTE — Assessment & Plan Note (Signed)
Hospitalization for same April 2015 related to BPH Indwelling Foley cath remained inflated back, following with urology Cystoscopy negative for malignancy or other problem Ongoing finasteride and Flomax, refills on same provided Reviewed potential of worsening symptoms/neurogenic bladder with chronic narcotic use -patient understands same

## 2013-08-26 NOTE — Assessment & Plan Note (Signed)
CTA with stable 3cm infrarenal AAA on 09/20/12 and 07/27/13 CT at 3.1 cm- reviewed The current medical regimen is effective;  continue present plan and medications.

## 2013-08-26 NOTE — Patient Instructions (Signed)
It was good to see you today.  We have reviewed your prior records including labs and tests today  Test(s) ordered today. Your results will be released to MyChart (or called to you) after review, usually within 72hours after test completion. If any changes need to be made, you will be notified at that same time.  Medications reviewed and updated, no changes recommended at this time.  Please schedule followup in 3-4 months, call sooner if problems.  

## 2013-08-26 NOTE — Assessment & Plan Note (Signed)
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

## 2013-08-26 NOTE — Assessment & Plan Note (Signed)
holding statin since April 2015 because of increased LFTs Recheck same now, consider resuming, potentially lower dose depending on results

## 2013-08-26 NOTE — Progress Notes (Signed)
Pre visit review using our clinic review tool, if applicable. No additional management support is needed unless otherwise documented below in the visit note. 

## 2013-08-26 NOTE — Assessment & Plan Note (Signed)
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

## 2013-08-26 NOTE — Progress Notes (Signed)
Subjective:    Patient ID: [name], male    DOB: 03-29-1941, 73 y.o.   MRN: [id number]  HPI  Patient here for follow up Reviewed chronic medical issues and interval medical events  Past Medical History  Diagnosis Date  . 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 COPD (chronic obstructive pulmonary disease)   . Cerebrovascular disease   . AAA (abdominal aortic aneurysm)   . 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 Hyperlipidemia   . HTN (hypertension)   . CONGENITAL UNSPEC REDUCTION DEFORMITY LOWER LIMB   . TOBACCO USE, QUIT   . CHF (congestive heart failure)     Review of Systems  Respiratory: Negative for cough and shortness of breath.   Cardiovascular: Positive for leg swelling. Negative for chest pain.  Gastrointestinal: Negative for nausea, abdominal pain, diarrhea and constipation.  Genitourinary: Positive for decreased urine volume (but no problem since placing foley).  Musculoskeletal: Positive for back pain (chronic) and gait problem (short LLE). Negative for myalgias.       Objective:   Physical Exam  BP 130/90  Pulse 89  Temp(Src) 97.9 F (36.6 C) (Oral)  Ht 6' (1.829 m)  Wt 210 lb (95.255 kg)  BMI 28.47 kg/m2  SpO2 95% Wt Readings from Last 3 Encounters:  08/26/13 210 lb (95.255 kg)  08/02/13 214 lb 4.6 oz (97.2 kg)  04/11/13 220 lb (99.791 kg)   Constitutional:  He is disheveled, but appears well-developed and well-nourished. No acute distress.  Neck: Normal range of motion. Neck supple. No JVD present. No thyromegaly present.  Cardiovascular: Normal rate, regular rhythm and normal heart sounds.  No murmur heard. 2+ pitting BLE edema Abdomen: protuberant but non tender, non distended - no ascites GU: foley cath intact to leg bag, no suprapubic fullness Musculoskeletal: no gross deformities - ambulates with chronically antalgic gait due to shortened LLE. Neurological: he is alert and oriented to person, place, and  time. No cranial nerve deficit. Coordination normal.  Psychiatric: he has a normal mood and affect. behavior is normal. Judgment and thought content normal.   Lab Results  Component Value Date   WBC 4.7 08/01/2013   HGB 11.5* 08/01/2013   HCT 33.7* 08/01/2013   PLT 140* 08/01/2013   GLUCOSE 115* 08/02/2013   CHOL 111 04/11/2013   TRIG 44.0 04/11/2013   HDL 44.00 04/11/2013   LDLCALC 58 04/11/2013   ALT 88* 08/02/2013   AST 48* 08/02/2013   NA 137 08/02/2013   K 3.7 08/02/2013   CL 97 08/02/2013   CREATININE 0.74 08/02/2013   BUN 13 08/02/2013   CO2 27 08/02/2013   TSH 1.66 04/11/2013   INR 1.26 07/29/2013   HGBA1C 5.7 04/11/2013   07/28/13 CT a/p w/ CM: IMPRESSION:  1. Mild bilateral hydronephrosis is likely secondary to marked  bladder distention, despite indwelling Foley catheter.  2. Calcification along the posterior wall of the bladder.  Transitional cell carcinoma cannot be excluded.  3. Prostate enlargement.  4. Advanced cirrhosis.  5. Small infrarenal aortic aneurysm.      Assessment & Plan:   Problem List Items Addressed This Visit   Abdominal aortic aneurysm     CTA with stable 3cm infrarenal AAA on 09/20/12 and 07/27/13 CT at 3.1 cm- reviewed The current medical regimen is effective;  continue present plan and medications.     BACK PAIN - Primary     Chronic related to DDD  and postpolio (short LLE) No new trauma to back or abnormalities on exam, but reports not controlled with current dose oxy (5 q6h) Increased to 7.5 mg QID in 03/2014 - 120 percocet per mo On oxy only, no tylenol, since hosp 07/2013 for BOO with LFT elevation Will provide enough 5mg  tabs to equate to prior 7.5 qid dose (#180/mo)    Relevant Medications      oxyCODONE (Oxy IR/ROXICODONE) immediate release tablet   Bladder outlet obstruction     Hospitalization for same April 2015 related to BPH Indwelling Foley cath remained inflated back, following with urology Cystoscopy negative for malignancy or  other problem Ongoing finasteride and Flomax, refills on same provided Reviewed potential of worsening symptoms/neurogenic bladder with chronic narcotic use -patient understands same    Cirrhosis     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

## 2013-08-26 NOTE — Addendum Note (Signed)
Addended by: Gwendolyn Grant A on: 08/26/2013 05:19 PM   Modules accepted: Orders

## 2013-08-26 NOTE — Progress Notes (Signed)
[name] [id number] 08/26/2013  Chief Complaint  Patient presents with  . Follow-up  . Medication Refill    oxy    Subjective  HPI  Pt presents for refill of oxycodone for chronic back pain and for hospital f/u on 08/06/13 (chronic back pain) and 07/28/13 (urinary retention).  Hospital admission 07/28/13: Hospitalized for urinary retention. Abdominal CT showed bilateral hydronephrosis w/ bladder distention, calcifications in bladder that could not exclude transitional cell cancer, enlarged prostate, AAA size 3.1 cm, and advanced cirrhosis. Diagnosed with bladder outlet obstruction given findings. RUQ Ab US showed mild cholelithiasis. Had Foley Catheter inserted to remove urine from bladder. Given rocephin for UTI prophylaxis. Labs showed elevated LFT's and hypokalemia. 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 Patient taken off percocet, and placed on oxycodone. Statins and niacin were withheld until LFTs return to normal. Given potassium- hypokalemia resolved. EKG showed Mobitz Type I- taken off metoporolol and put back on once resolved. Referred to [facility] for cystoscopy to r/o transitional cell cancer.   08/06/13 ED: [name] to ED for back pain. Chronic back pain, along with arm pain, given hx of polio. Pain worsened since CABG in 2010.  Has tried tramadol and percocet in the past, and oxycodone has been the only effective medication. Wanted oxycodone, but ED refused to fill it and was told to f/u with PCP.   08/26/13 [facility]: Did cystoscopy that r/o tumor and transitional cell cancer.   Today would like oxycodone w/o acetaminophen refill. Since only available in 5mg  tablets and was taking 7.5mg  in the past, would like 180 tablets instead of 120 to take 4 times a day for 1 month.   Pertinent positives: constipation (due to pain medication)  Denies CP, SOB (due to COPD that has not changed), smoking (hasn't smoked in 5 years), alcohol (hasn't  had in 10 years), abdominal pain, N/V/D.     Past Medical History  Diagnosis Date  . 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 COPD (chronic obstructive pulmonary disease)   . Cerebrovascular disease   . AAA (abdominal aortic aneurysm)   . 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 Hyperlipidemia   . HTN (hypertension)   . CONGENITAL UNSPEC REDUCTION DEFORMITY LOWER LIMB   . TOBACCO USE, QUIT   . CHF (congestive heart failure)   . 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 Cirrhosis     on CT a/p 07/2013, no longer drinking    Past Surgical History  Procedure Laterality Date  . Coronary artery bypass graft  11/03/2008    [name] - x4: left internal mammary artery to the distal left anterior descending, saphemous vein graft to the first circumflex marginal branch with a Y graft sequentiallly to a left posterolateral branch, saphenous vein graft to the distal right coronary artery    Family History  Problem Relation Age of Onset  . Cirrhosis Mother     died at 66  . Colon cancer Neg Hx   . Stomach cancer Neg Hx     History  Substance Use Topics  . Smoking status: Former Research scientist (life sciences)  . Smokeless tobacco: Never Used  . Alcohol Use: No     Comment: History of heavy alcohol use per pt. Quit many years ago1/1/ 2005    Current Outpatient Prescriptions on File Prior to Visit  Medication Sig Dispense Refill  . albuterol (PROVENTIL HFA;VENTOLIN HFA) 108 (90 BASE)  MCG/ACT inhaler Inhale into the lungs every 6 (six) hours as needed for wheezing or shortness of breath.      [name] amLODipine (NORVASC) 10 MG tablet Take 10 mg by mouth daily.      [name] aspirin 81 MG tablet Take 81 mg by mouth daily.        [name] buPROPion (WELLBUTRIN XL) 150 MG 24 hr tablet Take 150 mg by mouth daily.      . diphenhydrAMINE (BENADRYL) 25 mg capsule Take 25 mg by mouth every 6 (six) hours as needed. For itch/rash      . docusate sodium (STOOL SOFTENER) 100 MG capsule  Take 100 mg by mouth 2 (two) times daily.        . finasteride (PROSCAR) 5 MG tablet Take 1 tablet (5 mg total) by mouth daily.  30 tablet  0  . fluticasone (FLONASE) 50 MCG/ACT nasal spray Place 1 spray into both nostrils every morning.  16 g  3  . furosemide (LASIX) 40 MG tablet Take 40 mg by mouth daily.      [name] guaiFENesin (MUCINEX) 600 MG 12 hr tablet Take 1,200 mg by mouth 2 (two) times daily.        [name] oxyCODONE (OXY IR/ROXICODONE) 5 MG immediate release tablet Take 1 tablet (5 mg total) by mouth every 6 (six) hours as needed for severe pain. Do not fill more than once every 30 days.  120 tablet  0  . potassium chloride SA (K-DUR,KLOR-CON) 20 MEQ tablet Take 20 mEq by mouth 2 (two) times daily.      . Sennosides 25 MG TABS Take 25 mg by mouth daily.       . tamsulosin (FLOMAX) 0.4 MG CAPS capsule Take 1 capsule (0.4 mg total) by mouth daily after supper.  30 capsule  0  . traMADol (ULTRAM) 50 MG tablet Take 1 tablet (50 mg total) by mouth every 8 (eight) hours as needed.  90 tablet  0   No current facility-administered medications on file prior to visit.     Allergies: Allergies  Allergen Reactions  . Hydrocodone     REACTION: Rash    Review of Systems  Constitutional: Negative for fever, chills and weight loss.  Respiratory: Positive for shortness of breath. Negative for cough, sputum production and wheezing.   Cardiovascular: Positive for leg swelling. Negative for chest pain, palpitations and orthopnea.       Edema BL- takes furosemide daily which improves sxs.   Gastrointestinal: Positive for constipation. Negative for heartburn, nausea, vomiting, abdominal pain, diarrhea, blood in stool and melena.  Genitourinary: Negative for hematuria.       Has Foley Catheter in. Therefore, no urinary sxs.   Musculoskeletal: Negative for myalgias.  Neurological: Negative for dizziness, tingling and weakness.       Objective  Filed Vitals:   08/26/13 1335  BP: 130/90  Pulse: 89    Temp: 97.9 F (36.6 C)  TempSrc: Oral  Height: 6' (1.829 m)  Weight: 210 lb (95.255 kg)  SpO2: 95%    Physical Exam  Constitutional: He is oriented to person, place, and time. No distress.  Patient obese in NAD distress in the exam room.   HENT:  R & L ear canals impacted with cerumen BL.   Eyes: Conjunctivae are normal. Pupils are equal, round, and reactive to light. Left eye exhibits no discharge. No scleral icterus.  Neck: Neck supple. No thyromegaly present.  Cardiovascular: Normal rate, regular rhythm, normal heart sounds  and intact distal pulses.  Exam reveals no gallop and no friction rub.   No murmur heard. Respiratory: Effort normal and breath sounds normal. No respiratory distress. He has no wheezes. He has no rales.  Musculoskeletal: He exhibits edema (BLE, pitting).  Lymphadenopathy:    He has no cervical adenopathy.  Neurological: He is alert and oriented to person, place, and time. He has normal reflexes.  Psychiatric: He has a normal mood and affect. His behavior is normal. Judgment and thought content normal.    BP Readings from Last 3 Encounters:  08/26/13 130/90  08/06/13 150/73  08/02/13 137/57    Wt Readings from Last 3 Encounters:  08/26/13 210 lb (95.255 kg)  08/02/13 214 lb 4.6 oz (97.2 kg)  04/11/13 220 lb (99.791 kg)    Lab Results  Component Value Date   WBC 4.7 08/01/2013   HGB 11.5* 08/01/2013   HCT 33.7* 08/01/2013   PLT 140* 08/01/2013   GLUCOSE 115* 08/02/2013   CHOL 111 04/11/2013   TRIG 44.0 04/11/2013   HDL 44.00 04/11/2013   LDLCALC 58 04/11/2013   ALT 88* 08/02/2013   AST 48* 08/02/2013   NA 137 08/02/2013   K 3.7 08/02/2013   CL 97 08/02/2013   CREATININE 0.74 08/02/2013   BUN 13 08/02/2013   CO2 27 08/02/2013   TSH 1.66 04/11/2013   INR 1.26 07/29/2013   HGBA1C 5.7 04/11/2013    No results found.     Assessment and Plan  Chronic Back Pain: Prescribed oxycodone w/o acetaminophen 5mg  tablets. Prescribed 180 for 1 month. Told  patient to take 1 tablet with 1/2 tablet so he will be taking 7.5mg  TID.  Cirrhosis: Severe cirrhosis found on Ab CT. Checked LFTS, CBC, Protime, BMP.  If LFTs are back to normal, will tell patient to resume Lipitor, Niacin. CBC to look for pancytopenia or evidence of anemia. Checked Protime-INR to see if evidence of coaguloapathies secondary to cirrhosis. Told patient to continue not smoking and drinking.   BPH: Continue Flomax and Finasteride. Continue f/u appointments w/ urology. Next appt today at 2:30.   Bladder outlet obstruction: Continue using Foley catheter and f/u appts with urology.   AAA: 3.1 cm. Will continue to  monitor size of AAA with abdomoninal US q 67months.   Dyslipidemia: Encouraged weight loss/diet. Will resume niacin and lipitor after further eval of cirrhosis or when LFTs return to normal.  No Follow-up on file. Berenice Bouton, Student-PA    I have personally reviewed this case with PA student. I also personally examined this patient. I agree with history and findings as documented above. I reviewed, discussed and approve of the assessment and plan as listed above. Rowe Clack, MD

## 2013-09-06 ENCOUNTER — Encounter: Payer: Self-pay | Admitting: Physician Assistant

## 2013-09-06 ENCOUNTER — Ambulatory Visit (INDEPENDENT_AMBULATORY_CARE_PROVIDER_SITE_OTHER): Payer: Medicare Other | Admitting: Physician Assistant

## 2013-09-06 VITALS — BP 124/66 | HR 85 | Ht 72.0 in | Wt 216.0 lb

## 2013-09-06 DIAGNOSIS — I2510 Atherosclerotic heart disease of native coronary artery without angina pectoris: Secondary | ICD-10-CM

## 2013-09-06 DIAGNOSIS — I714 Abdominal aortic aneurysm, without rupture, unspecified: Secondary | ICD-10-CM

## 2013-09-06 DIAGNOSIS — I441 Atrioventricular block, second degree: Secondary | ICD-10-CM | POA: Insufficient documentation

## 2013-09-06 DIAGNOSIS — I10 Essential (primary) hypertension: Secondary | ICD-10-CM | POA: Diagnosis not present

## 2013-09-06 DIAGNOSIS — R609 Edema, unspecified: Secondary | ICD-10-CM

## 2013-09-06 DIAGNOSIS — I6529 Occlusion and stenosis of unspecified carotid artery: Secondary | ICD-10-CM

## 2013-09-06 DIAGNOSIS — K7460 Unspecified cirrhosis of liver: Secondary | ICD-10-CM

## 2013-09-06 DIAGNOSIS — E785 Hyperlipidemia, unspecified: Secondary | ICD-10-CM

## 2013-09-06 HISTORY — DX: Atrioventricular block, second degree: I44.1

## 2013-09-06 NOTE — Patient Instructions (Signed)
INCREASE LASIX FOR 3 DAYS ONLY TO 40 MG TWICE DAILY; AFTER 3 DAYS RESUME CURRENT DOSE INCREASE POTASSIUM FOR 3 DAYS ONLY TO 26 MEQ TWICE DAILY; AFTER 3 DAYS RESUME CURRENT DOSE  LAB WORK IN 1 WEEK  PLEASE SCHEDULE TO HAVE AN ABDOMINAL CT-A DX AAA; THIS NEEDS TO BE DONE ON ORE AFTER 10/28/13 PLEASE SCHEDULE TO HAVE A LOWER EXTREMITY VENOUS DUPLEX; DX LEFT LEG EDEMA, R/O DVT  CHANGE YOUR APPT WITH DR. CRENSHAW IN July 2015 AT [facility] TO BE DONE ABOUT 4 WEEKS AFTER YOUR CT-A IS COMPLETE

## 2013-09-06 NOTE — Progress Notes (Signed)
[address], [place], [place]  [address] Phone: [phone] Fax:  [phone]  Date:  09/06/2013   ID:  [name], DOB 1940-07-19, MRN [id number]  PCP:  Gwendolyn Grant, MD  Cardiologist:  Dr. Kirk Ruths      History of Present Illness: [name] is a 73 y.o. male with a hx of CAD s/p NSTEMI followed by CABG 10/2008 (L-LAD, S-OM1/L PL br, S-RCA), small AAA (3cm), carotid stenosis, BPH, COPD, HTN, HL.  Last seen by Dr. Kirk Ruths in 09/2012.  F/u CTA of abdomen demonstrated a stable AAA.  He was admitted 07/2013 with elevated LFTs and urinary retention.  Liver appeared cirrhotic on CT.  Hep serologies neg.  He has been seen by urology and cystoscopy was neg for malignancy.  Statin was held due to elevated LFTs.  Beta blocker was held due to Mobitz Type 1 noted during sleep with HRs down into the 30s. He saw Gwendolyn Grant, MD after d/c and LFTs were improved.  Lipitor was resumed.  He is doing well.  Denies chest pain.  Denies syncope or near syncope. No dizziness.  He has chronic DOE 2/2 COPD.  He is NYHA 2b-3.  There has been no change.  No orthopnea, PND.  He has chronic LE edema.  LLE is now bigger than the R.  Of note, SVG harvested from RLE.    Studies:  - LHC (09/2008):  3 v CAD, EF 65% => CABG  - Echo (08/2010):  EF 55-60%, Gr 1 DD, mild LAE, MAC  - Carotid US (06/4194):  RICA 2-22%; LICA 97-98% - f/u 2 years   Abdominal CTA (10/2012): IMPRESSION: Stable 3 cm infrarenal AAA.  Stable atherosclerotic changes of the abdominal vasculature Hepatic cirrhosis Cholelithiasis  - repeat due 10/2013   Recent Labs: 04/11/2013: HDL Cholesterol by NMR 44.00; LDL (calc) 58; TSH 1.66  08/26/2013: ALT 49; Creatinine 0.8; Hemoglobin 12.6*; Potassium 4.3   Wt Readings from Last 3 Encounters:  09/06/13 216 lb (97.977 kg)  08/26/13 210 lb (95.255 kg)  08/02/13 214 lb 4.6 oz (97.2 kg)     Past Medical History  Diagnosis Date  . CAD (coronary artery disease)      s/p CABG 10/2008  . COPD (chronic obstructive pulmonary disease)   . Cerebrovascular disease   . AAA (abdominal aortic aneurysm)   . 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 Hyperlipidemia   . HTN (hypertension)   . CONGENITAL UNSPEC REDUCTION DEFORMITY LOWER LIMB   . TOBACCO USE, QUIT   . CHF (congestive heart failure)   . 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 Cirrhosis     on CT a/p 07/2013, no longer drinking  . Mobitz type 1 second degree atrioventricular block 09/06/2013    Current Outpatient Prescriptions  Medication Sig Dispense Refill  . albuterol (PROVENTIL HFA;VENTOLIN HFA) 108 (90 BASE) MCG/ACT inhaler Inhale into the lungs every 6 (six) hours as needed for wheezing or shortness of breath.      [name] amLODipine (NORVASC) 10 MG tablet Take 10 mg by mouth daily.      [name] aspirin 81 MG tablet Take 81 mg by mouth daily.        [name] atorvastatin (LIPITOR) 40 MG tablet Take 1 tablet (40 mg total) by mouth daily.  90 tablet  3  . buPROPion (WELLBUTRIN XL) 150 MG 24 hr tablet Take 150 mg by mouth daily.      [name]  diphenhydrAMINE (BENADRYL) 25 mg capsule Take 25 mg by mouth every 6 (six) hours as needed. For itch/rash      . docusate sodium (STOOL SOFTENER) 100 MG capsule Take 100 mg by mouth 2 (two) times daily.        . finasteride (PROSCAR) 5 MG tablet Take 1 tablet (5 mg total) by mouth daily.  90 tablet  3  . fluticasone (FLONASE) 50 MCG/ACT nasal spray Place 1 spray into both nostrils every morning.  16 g  3  . furosemide (LASIX) 40 MG tablet Take 40 mg by mouth daily.      [name] guaiFENesin (MUCINEX) 600 MG 12 hr tablet Take 1,200 mg by mouth 2 (two) times daily.        [name] oxyCODONE (OXY IR/ROXICODONE) 5 MG immediate release tablet Take 1.5 tablets (7.5 mg total) by mouth every 6 (six) hours as needed for severe pain. Do not fill more than once every 30 days.  180 tablet  0  . potassium chloride SA (K-DUR,KLOR-CON) 20 MEQ tablet Take 20 mEq by  mouth 2 (two) times daily.      . Sennosides 25 MG TABS Take 25 mg by mouth daily.       . tamsulosin (FLOMAX) 0.4 MG CAPS capsule Take 1 capsule (0.4 mg total) by mouth daily after supper.  90 capsule  3   No current facility-administered medications for this visit.    Allergies:   Hydrocodone   Social History:  The patient  reports that he has quit smoking. He has never used smokeless tobacco. He reports that he does not drink alcohol or use illicit drugs.   Family History:  The patient's family history includes Cirrhosis in his mother. There is no history of Colon cancer or Stomach cancer.   ROS:  Please see the history of present illness.      All other systems reviewed and negative.   PHYSICAL EXAM: VS:  BP 124/66  Pulse 85  Ht 6' (1.829 m)  Wt 216 lb (97.977 kg)  BMI 29.29 kg/m2 Well nourished, well developed, in no acute distress HEENT: normal Neck: no JVD Cardiac:  normal S1, S2; RRR; 1/6 systolic murmurLUSB Lungs:  Decreased breath sounds bilaterally, no wheezing, rhonchi or rales Abd: soft, nontender, no hepatomegaly Ext: 1-2+ L>>R LE edema Skin: warm and dry Neuro:  CNs 2-12 intact, no focal abnormalities noted  EKG:  NSR, HR 85, NSSTTW changes, 1st degree AVB (PR 220 ms), no change from prior tracing.     ASSESSMENT AND PLAN:  1. CAD:  No angina.  Continue ASA, statin.  He is off his beta blocker now due to hx of Mobitz Type 1 2nd degree AVB.   2. Mobitz type 1 second degree atrioventricular block:  No symptoms of dizziness or near syncope.  Remain off of beta blocker for now.  Consider Event Monitor if he develops symptoms suggestive or bradycardia or high grade heart block. 3. Edema:  L leg is much bigger than the R.  He thinks this got worse in the hospital.  No other signs or symptoms of CHF. He has some evidence of venous insufficiency.  Edema is somewhat better in the AM and worse in the PM.  Given recent hospital stay, will get LLE venous US to r/o DVT.   Increase Lasix 40 BID x 3 days, K+ 40 BID x 3 days.  Check BMET in 1 week. 4. HYPERTENSION:  Controlled.  5. CAROTID ARTERY STENOSIS:  F/u due  in 2016. 6. Abdominal aortic aneurysm:  Arrange f/u Abdominal CTA in 10/2013. 7. Cirrhosis:  LFTs returned to baseline and Statin resumed. 8. HYPERLIPIDEMIA:  Continue statin. 9. Disposition:  F/u with Dr. Kirk Ruths in 10/2013 as planned.    Signed, Richardson Dopp, PA-C  09/06/2013 3:39 PM

## 2013-09-13 ENCOUNTER — Encounter (HOSPITAL_COMMUNITY): Payer: Medicare Other

## 2013-09-13 ENCOUNTER — Other Ambulatory Visit: Payer: Medicare Other

## 2013-09-21 ENCOUNTER — Other Ambulatory Visit (INDEPENDENT_AMBULATORY_CARE_PROVIDER_SITE_OTHER): Payer: Medicare Other

## 2013-09-21 ENCOUNTER — Ambulatory Visit (HOSPITAL_COMMUNITY): Payer: Medicare Other | Attending: Physician Assistant | Admitting: Cardiology

## 2013-09-21 DIAGNOSIS — I10 Essential (primary) hypertension: Secondary | ICD-10-CM | POA: Diagnosis not present

## 2013-09-21 DIAGNOSIS — R609 Edema, unspecified: Secondary | ICD-10-CM

## 2013-09-21 DIAGNOSIS — J449 Chronic obstructive pulmonary disease, unspecified: Secondary | ICD-10-CM | POA: Diagnosis not present

## 2013-09-21 DIAGNOSIS — Z951 Presence of aortocoronary bypass graft: Secondary | ICD-10-CM | POA: Diagnosis not present

## 2013-09-21 DIAGNOSIS — E785 Hyperlipidemia, unspecified: Secondary | ICD-10-CM | POA: Diagnosis not present

## 2013-09-21 DIAGNOSIS — I2510 Atherosclerotic heart disease of native coronary artery without angina pectoris: Secondary | ICD-10-CM | POA: Diagnosis not present

## 2013-09-21 DIAGNOSIS — M7989 Other specified soft tissue disorders: Secondary | ICD-10-CM | POA: Diagnosis not present

## 2013-09-21 DIAGNOSIS — Z87891 Personal history of nicotine dependence: Secondary | ICD-10-CM | POA: Insufficient documentation

## 2013-09-21 DIAGNOSIS — J4489 Other specified chronic obstructive pulmonary disease: Secondary | ICD-10-CM | POA: Insufficient documentation

## 2013-09-21 NOTE — Progress Notes (Signed)
Lower venous duplex unilateral completed. 

## 2013-09-22 ENCOUNTER — Telehealth: Payer: Self-pay | Admitting: *Deleted

## 2013-09-22 LAB — BASIC METABOLIC PANEL
BUN: 22 mg/dL (ref 6–23)
CO2: 26 mEq/L (ref 19–32)
Calcium: 9 mg/dL (ref 8.4–10.5)
Chloride: 107 mEq/L (ref 96–112)
Creatinine, Ser: 0.8 mg/dL (ref 0.4–1.5)
GFR: 95.77 mL/min (ref >60.00)
Glucose, Bld: 102 mg/dL — ABNORMAL HIGH (ref 70–99)
Potassium: 4.3 mEq/L (ref 3.5–5.1)
Sodium: 139 mEq/L (ref 135–145)

## 2013-09-22 NOTE — Telephone Encounter (Signed)
pt notified of both LEA and lab results with verbal understanding.

## 2013-09-23 ENCOUNTER — Other Ambulatory Visit: Payer: Self-pay | Admitting: Internal Medicine

## 2013-09-23 MED ORDER — OXYCODONE HCL 5 MG PO TABS: ORAL_TABLET | ORAL | Status: DC

## 2013-09-23 NOTE — Telephone Encounter (Signed)
Patient is calling to request a new script for Oxycodone. Please advise.

## 2013-09-23 NOTE — Telephone Encounter (Signed)
Pt called to see if the refill is ready.  I explained that there is a request in the system and that the turn around on refills is 24-48 hours.  He told me not to get smart with him that he always call and gets it that afternoon.  He states he is a [place] [employer] and he will go over my head to get his refill.  I let him know we will call him when it is ready.

## 2013-09-23 NOTE — Telephone Encounter (Signed)
Pt called again at 3:16pm requesting the status of his Oxycodone refill.  He states he has been calling since this morning and he never has to wait for his refill.

## 2013-09-23 NOTE — Telephone Encounter (Signed)
Pt is in lobby wanting to wait for script!

## 2013-09-23 NOTE — Telephone Encounter (Signed)
Pt called to check up on this medication request, pt request for this to be done today if this is possible because he doesn't think he can go without this medication this weekend. Please advise.

## 2013-09-24 ENCOUNTER — Encounter (HOSPITAL_COMMUNITY): Payer: Self-pay | Admitting: Emergency Medicine

## 2013-09-24 ENCOUNTER — Emergency Department (HOSPITAL_COMMUNITY)
Admission: EM | Admit: 2013-09-24 | Discharge: 2013-09-24 | Disposition: A | Discharge status: Home / Self Care | Payer: Medicare Other | Attending: Emergency Medicine | Admitting: Emergency Medicine

## 2013-09-24 DIAGNOSIS — Z7982 Long term (current) use of aspirin: Secondary | ICD-10-CM | POA: Diagnosis not present

## 2013-09-24 DIAGNOSIS — Y846 Urinary catheterization as the cause of abnormal reaction of the patient, or of later complication, without mention of misadventure at the time of the procedure: Secondary | ICD-10-CM | POA: Insufficient documentation

## 2013-09-24 DIAGNOSIS — IMO0002 Reserved for concepts with insufficient information to code with codable children: Secondary | ICD-10-CM | POA: Insufficient documentation

## 2013-09-24 DIAGNOSIS — I509 Heart failure, unspecified: Secondary | ICD-10-CM | POA: Diagnosis not present

## 2013-09-24 DIAGNOSIS — Z79899 Other long term (current) drug therapy: Secondary | ICD-10-CM | POA: Diagnosis not present

## 2013-09-24 DIAGNOSIS — Z8673 Personal history of transient ischemic attack (TIA), and cerebral infarction without residual deficits: Secondary | ICD-10-CM | POA: Diagnosis not present

## 2013-09-24 DIAGNOSIS — Z87891 Personal history of nicotine dependence: Secondary | ICD-10-CM | POA: Insufficient documentation

## 2013-09-24 DIAGNOSIS — T83091A Other mechanical complication of indwelling urethral catheter, initial encounter: Secondary | ICD-10-CM | POA: Diagnosis not present

## 2013-09-24 DIAGNOSIS — T83011A Breakdown (mechanical) of indwelling urethral catheter, initial encounter: Secondary | ICD-10-CM

## 2013-09-24 DIAGNOSIS — E785 Hyperlipidemia, unspecified: Secondary | ICD-10-CM | POA: Insufficient documentation

## 2013-09-24 DIAGNOSIS — I252 Old myocardial infarction: Secondary | ICD-10-CM | POA: Diagnosis not present

## 2013-09-24 DIAGNOSIS — Z951 Presence of aortocoronary bypass graft: Secondary | ICD-10-CM | POA: Diagnosis not present

## 2013-09-24 DIAGNOSIS — J449 Chronic obstructive pulmonary disease, unspecified: Secondary | ICD-10-CM | POA: Diagnosis not present

## 2013-09-24 DIAGNOSIS — Z8719 Personal history of other diseases of the digestive system: Secondary | ICD-10-CM | POA: Diagnosis not present

## 2013-09-24 DIAGNOSIS — I10 Essential (primary) hypertension: Secondary | ICD-10-CM | POA: Diagnosis not present

## 2013-09-24 DIAGNOSIS — I2510 Atherosclerotic heart disease of native coronary artery without angina pectoris: Secondary | ICD-10-CM | POA: Insufficient documentation

## 2013-09-24 DIAGNOSIS — J4489 Other specified chronic obstructive pulmonary disease: Secondary | ICD-10-CM | POA: Insufficient documentation

## 2013-09-24 DIAGNOSIS — N400 Enlarged prostate without lower urinary tract symptoms: Secondary | ICD-10-CM | POA: Insufficient documentation

## 2013-09-24 NOTE — Discharge Instructions (Signed)
Return sooner to the emergency department or your Dr. if you develop fever, abdominal pain, vomiting, inability to urinate, or other concerns.

## 2013-09-24 NOTE — ED Notes (Signed)
Per pt, has indwelling foley cath.  Yesterday after 5 pm, pt cath started leaking.  Pt has had foley for 2 months.  Was getting cath re-evaluated of Friday.

## 2013-09-24 NOTE — Telephone Encounter (Signed)
Rx was given to pt yesterday 09/22/13...[name]

## 2013-09-24 NOTE — ED Provider Notes (Signed)
CSN: [id number]     Arrival date & time 09/24/13  1448 History   First MD Initiated Contact with Patient 09/24/13 1503     Chief Complaint  Patient presents with  . 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 Past Medical History  Diagnosis Date  . 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 COPD (chronic obstructive pulmonary disease)   . Cerebrovascular disease   . AAA (abdominal aortic aneurysm)   . 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 Hyperlipidemia   . HTN (hypertension)   . CONGENITAL UNSPEC REDUCTION DEFORMITY LOWER LIMB   . TOBACCO USE, QUIT   . CHF (congestive heart failure)   . 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 Cirrhosis     on CT a/p 07/2013, no longer drinking  . Mobitz type 1 second degree atrioventricular block 09/06/2013   Past Surgical History  Procedure Laterality Date  . Coronary artery bypass graft  11/03/2008    Ricard Dillon - x4: left internal mammary artery to the distal left anterior descending, saphemous vein graft to the first circumflex marginal branch with a Y graft sequentiallly to a left posterolateral branch, saphenous vein graft to the distal right coronary artery   Family History  Problem Relation Age of Onset  . Cirrhosis Mother     died at 75  . Colon cancer Neg Hx   . Stomach cancer Neg Hx    History  Substance Use Topics  . Smoking status: Former Research scientist (life sciences)  . Smokeless tobacco: Never Used  . Alcohol Use: No   Comment: History of heavy alcohol use per pt. Quit many years ago1/1/ 2005    Review of Systems 10 Systems reviewed and are negative for acute change except as noted in the HPI.   Allergies  Hydrocodone  Home Medications   Prior to Admission medications   Medication Sig Start Date End Date Taking? Authorizing Provider  albuterol (PROVENTIL HFA;VENTOLIN HFA) 108 (90 BASE) MCG/ACT inhaler Inhale into the lungs every 6 (six) hours as needed for wheezing or shortness of breath.   Yes Historical Provider, MD  amLODipine (NORVASC) 10 MG tablet Take 10 mg by mouth daily.   Yes Historical Provider, MD  aspirin 81 MG tablet Take 81 mg by mouth daily.     Yes Historical Provider, MD  atorvastatin (LIPITOR) 40 MG tablet Take 1 tablet (40 mg total) by mouth daily. 08/26/13  Yes Rowe Clack, MD  buPROPion (WELLBUTRIN XL) 150 MG 24 hr tablet Take 150 mg by mouth daily.   Yes Historical Provider, MD  diphenhydrAMINE (BENADRYL) 25 mg capsule Take 25 mg by mouth every 6 (six) hours as needed. For itch/rash   Yes Historical Provider, MD  docusate sodium (STOOL SOFTENER) 100 MG capsule Take 100 mg by mouth 2 (two) times daily.     Yes Historical Provider, MD  finasteride (PROSCAR) 5 MG tablet Take 1 tablet (5 mg total) by mouth daily. 08/26/13  Yes Rowe Clack, MD  fluticasone (FLONASE) 50 MCG/ACT nasal spray  Place 1 spray into both nostrils every morning. 08/12/13  Yes Rowe Clack, MD  guaiFENesin (MUCINEX) 600 MG 12 hr tablet Take 1,200 mg by mouth 2 (two) times daily.     Yes Historical Provider, MD  potassium chloride SA (K-DUR,KLOR-CON) 20 MEQ tablet Take 20 mEq by mouth 2 (two) times daily.   Yes Historical Provider, MD  Sennosides 25 MG TABS Take 25 mg by mouth daily.    Yes Historical Provider, MD  tamsulosin (FLOMAX) 0.4 MG CAPS capsule Take 1 capsule (0.4 mg total) by mouth daily after supper. 08/26/13  Yes Rowe Clack, MD  furosemide (LASIX) 40 MG tablet Take 1 tablet (40 mg  total) by mouth daily. 09/26/13   Lelon Perla, MD  oxyCODONE (OXY IR/ROXICODONE) 5 MG immediate release tablet Take 1.5 tablets (7.5 mg total) by mouth every 6 (six) hours as needed for severe pain. Do not until 09/26/13 09/26/13   Rowe Clack, MD   BP 162/50  Pulse 71  Temp(Src) 97.6 F (36.4 C) (Oral)  Resp 22  SpO2 96% Physical Exam  Nursing note and vitals reviewed. Constitutional:  Awake, alert, nontoxic appearance.  HENT:  Head: Atraumatic.  Eyes: Right eye exhibits no discharge. Left eye exhibits no discharge.  Neck: Neck supple.  Cardiovascular: Normal rate and regular rhythm.   No murmur heard. Pulmonary/Chest: Effort normal and breath sounds normal. No respiratory distress. He has no wheezes. He has no rales. He exhibits no tenderness.  Abdominal: Soft. Bowel sounds are normal. He exhibits no distension and no mass. There is no tenderness. There is no rebound and no guarding.  Genitourinary:  Foley catheter appears to be draining urine with leaking leg bag  Musculoskeletal: He exhibits no tenderness.  Baseline ROM, no obvious new focal weakness.  Neurological:  Mental status and motor strength appears baseline for patient and situation.  Skin: No rash noted.  Psychiatric: He has a normal mood and affect.    ED Course  Procedures (including critical care time) Patient / Family / Caregiver informed of clinical course, understand medical decision-making process, and agree with plan. Labs Review Labs Reviewed - No data to display  Imaging Review No results found.   EKG Interpretation None      MDM   Final diagnoses:  Malfunction of Foley catheter    I doubt any other EMC precluding discharge at this time including, but not necessarily limited to the following:SBI.    Babette Relic, MD 09/26/13 [phone]

## 2013-09-24 NOTE — ED Notes (Signed)
Leg bag replaced.

## 2013-09-26 ENCOUNTER — Telehealth: Payer: Self-pay | Admitting: *Deleted

## 2013-09-26 ENCOUNTER — Other Ambulatory Visit: Payer: Self-pay

## 2013-09-26 MED ORDER — FUROSEMIDE 40 MG PO TABS: 40.0000 mg | ORAL_TABLET | Freq: Every day | ORAL | Status: DC

## 2013-09-26 MED ORDER — OXYCODONE HCL 5 MG PO TABS: 7.5000 mg | ORAL_TABLET | Freq: Four times a day (QID) | ORAL | Status: DC | PRN

## 2013-09-26 NOTE — Telephone Encounter (Signed)
Pt walk-in rx that was given on Friday for his oxycodone did not have any directions. Need updated script with directions...[name]

## 2013-09-26 NOTE — Telephone Encounter (Signed)
Uncertain why sig did not print - Will destroy old rx -  new rx done - verified correct sig thanks

## 2013-09-30 DIAGNOSIS — N139 Obstructive and reflux uropathy, unspecified: Secondary | ICD-10-CM | POA: Diagnosis not present

## 2013-09-30 DIAGNOSIS — R339 Retention of urine, unspecified: Secondary | ICD-10-CM | POA: Diagnosis not present

## 2013-09-30 DIAGNOSIS — N138 Other obstructive and reflux uropathy: Secondary | ICD-10-CM | POA: Diagnosis not present

## 2013-09-30 DIAGNOSIS — N401 Enlarged prostate with lower urinary tract symptoms: Secondary | ICD-10-CM | POA: Diagnosis not present

## 2013-10-04 ENCOUNTER — Other Ambulatory Visit: Payer: Self-pay

## 2013-10-04 MED ORDER — POTASSIUM CHLORIDE CRYS ER 20 MEQ PO TBCR: 20.0000 meq | EXTENDED_RELEASE_TABLET | Freq: Two times a day (BID) | ORAL | Status: DC

## 2013-10-05 ENCOUNTER — Other Ambulatory Visit: Payer: Self-pay | Admitting: *Deleted

## 2013-10-05 MED ORDER — AMLODIPINE BESYLATE 10 MG PO TABS: 10.0000 mg | ORAL_TABLET | Freq: Every day | ORAL | Status: DC

## 2013-10-10 ENCOUNTER — Other Ambulatory Visit: Payer: Self-pay | Admitting: Internal Medicine

## 2013-10-24 ENCOUNTER — Other Ambulatory Visit: Payer: Self-pay | Admitting: *Deleted

## 2013-10-24 NOTE — Telephone Encounter (Signed)
Left msg on triage requesting refill on his oxycodone.../lmb 

## 2013-10-25 ENCOUNTER — Ambulatory Visit: Payer: Medicare Other | Admitting: Cardiology

## 2013-10-25 MED ORDER — OXYCODONE HCL 5 MG PO TABS: 7.5000 mg | ORAL_TABLET | Freq: Four times a day (QID) | ORAL | Status: DC | PRN

## 2013-10-25 NOTE — Telephone Encounter (Signed)
Notified pt rx ready for pick-up.../lmb 

## 2013-10-27 DIAGNOSIS — R339 Retention of urine, unspecified: Secondary | ICD-10-CM | POA: Diagnosis not present

## 2013-10-28 DIAGNOSIS — N312 Flaccid neuropathic bladder, not elsewhere classified: Secondary | ICD-10-CM | POA: Diagnosis not present

## 2013-10-28 DIAGNOSIS — N138 Other obstructive and reflux uropathy: Secondary | ICD-10-CM | POA: Diagnosis not present

## 2013-10-28 DIAGNOSIS — R339 Retention of urine, unspecified: Secondary | ICD-10-CM | POA: Diagnosis not present

## 2013-10-28 DIAGNOSIS — N401 Enlarged prostate with lower urinary tract symptoms: Secondary | ICD-10-CM | POA: Diagnosis not present

## 2013-10-28 DIAGNOSIS — N139 Obstructive and reflux uropathy, unspecified: Secondary | ICD-10-CM | POA: Diagnosis not present

## 2013-10-31 ENCOUNTER — Inpatient Hospital Stay: Admission: RE | Admit: 2013-10-31 | Payer: Medicare Other | Source: Ambulatory Visit

## 2013-11-01 ENCOUNTER — Inpatient Hospital Stay: Admission: RE | Admit: 2013-11-01 | Payer: Medicare Other | Source: Ambulatory Visit

## 2013-11-02 ENCOUNTER — Ambulatory Visit (INDEPENDENT_AMBULATORY_CARE_PROVIDER_SITE_OTHER)
Admission: RE | Admit: 2013-11-02 | Discharge: 2013-11-02 | Disposition: A | Discharge status: Home / Self Care | Payer: Medicare Other | Source: Ambulatory Visit | Attending: Physician Assistant | Admitting: Physician Assistant

## 2013-11-02 DIAGNOSIS — I714 Abdominal aortic aneurysm, without rupture, unspecified: Secondary | ICD-10-CM

## 2013-11-02 DIAGNOSIS — I774 Celiac artery compression syndrome: Secondary | ICD-10-CM | POA: Diagnosis not present

## 2013-11-02 DIAGNOSIS — I701 Atherosclerosis of renal artery: Secondary | ICD-10-CM | POA: Diagnosis not present

## 2013-11-02 DIAGNOSIS — K8020 Calculus of gallbladder without cholecystitis without obstruction: Secondary | ICD-10-CM | POA: Diagnosis not present

## 2013-11-02 MED ORDER — IOHEXOL 350 MG/ML SOLN
100.0000 mL | Freq: Once | INTRAVENOUS | Status: AC | PRN
  Administered 2013-11-02: 100 mL via INTRAVENOUS

## 2013-11-04 ENCOUNTER — Telehealth: Payer: Self-pay | Admitting: *Deleted

## 2013-11-04 ENCOUNTER — Other Ambulatory Visit: Payer: Self-pay | Admitting: Internal Medicine

## 2013-11-04 NOTE — Telephone Encounter (Signed)
pt notified about AAA results stable and will repeat in 1 yr. Pt verbalized understanding to results and Plan of Care

## 2013-11-07 ENCOUNTER — Other Ambulatory Visit: Payer: Self-pay | Admitting: Internal Medicine

## 2013-11-20 ENCOUNTER — Other Ambulatory Visit: Payer: Self-pay | Admitting: Internal Medicine

## 2013-11-23 ENCOUNTER — Other Ambulatory Visit: Payer: Self-pay | Admitting: *Deleted

## 2013-11-23 NOTE — Telephone Encounter (Signed)
Left msg on triage requesting refill on his oxycodone.../lmb 

## 2013-11-24 MED ORDER — OXYCODONE HCL 5 MG PO TABS: 7.5000 mg | ORAL_TABLET | Freq: Four times a day (QID) | ORAL | Status: DC | PRN

## 2013-11-24 NOTE — Telephone Encounter (Signed)
Notified pt rx ready for pick-up.../lmb 

## 2013-11-25 ENCOUNTER — Other Ambulatory Visit: Payer: Self-pay | Admitting: Internal Medicine

## 2013-11-28 DIAGNOSIS — R339 Retention of urine, unspecified: Secondary | ICD-10-CM | POA: Diagnosis not present

## 2013-11-28 DIAGNOSIS — B372 Candidiasis of skin and nail: Secondary | ICD-10-CM | POA: Diagnosis not present

## 2013-12-13 ENCOUNTER — Encounter: Payer: Self-pay | Admitting: *Deleted

## 2013-12-13 ENCOUNTER — Encounter: Payer: Self-pay | Admitting: Cardiology

## 2013-12-13 ENCOUNTER — Ambulatory Visit (INDEPENDENT_AMBULATORY_CARE_PROVIDER_SITE_OTHER): Payer: Medicare Other | Admitting: Cardiology

## 2013-12-13 VITALS — BP 142/64 | HR 71 | Ht 72.0 in | Wt 214.2 lb

## 2013-12-13 DIAGNOSIS — I4891 Unspecified atrial fibrillation: Secondary | ICD-10-CM

## 2013-12-13 DIAGNOSIS — I441 Atrioventricular block, second degree: Secondary | ICD-10-CM

## 2013-12-13 DIAGNOSIS — R609 Edema, unspecified: Secondary | ICD-10-CM | POA: Diagnosis not present

## 2013-12-13 DIAGNOSIS — I714 Abdominal aortic aneurysm, without rupture, unspecified: Secondary | ICD-10-CM

## 2013-12-13 DIAGNOSIS — I6529 Occlusion and stenosis of unspecified carotid artery: Secondary | ICD-10-CM | POA: Diagnosis not present

## 2013-12-13 DIAGNOSIS — I2510 Atherosclerotic heart disease of native coronary artery without angina pectoris: Secondary | ICD-10-CM

## 2013-12-13 DIAGNOSIS — R0602 Shortness of breath: Secondary | ICD-10-CM | POA: Diagnosis not present

## 2013-12-13 DIAGNOSIS — R011 Cardiac murmur, unspecified: Secondary | ICD-10-CM

## 2013-12-13 MED ORDER — LOSARTAN POTASSIUM 50 MG PO TABS: 50.0000 mg | ORAL_TABLET | Freq: Every day | ORAL | Status: DC

## 2013-12-13 NOTE — Assessment & Plan Note (Signed)
Schedule echocardiogram

## 2013-12-13 NOTE — Assessment & Plan Note (Signed)
Continue off of beta blocker.

## 2013-12-13 NOTE — Assessment & Plan Note (Signed)
Followup abdominal ultrasound may 2015.

## 2013-12-13 NOTE — Assessment & Plan Note (Signed)
Continue aspirin and statin. 

## 2013-12-13 NOTE — Patient Instructions (Signed)
Your physician recommends that you schedule a follow-up appointment in: 3 MONTHS WITH DR CRENSHAW  STOP AMLODIPINE  START LOSARTAN 2 MG ONCE DAILY  Your physician has requested that you have an echocardiogram. Echocardiography is a painless test that uses sound waves to create images of your heart. It provides your doctor with information about the size and shape of your heart and how well your heart's chambers and valves are working. This procedure takes approximately one hour. There are no restrictions for this procedure.   Your physician recommends that you return for lab work in: [place]

## 2013-12-13 NOTE — Assessment & Plan Note (Signed)
Continue statin. 

## 2013-12-13 NOTE — Progress Notes (Signed)
HPI: FU CAD; s/p NSTEMI followed by CABG 10/2008 (L-LAD, S-OM1/L PL br, S-RCA), small AAA, carotid stenosis. F/u CTA of abdomen 5/15 showed AAA 3 x 3.1 cm, cirrhosis and splenomegaly suggesting portal HTN. Beta blocker previously held due to Mobitz Type 1.  Since last seen, He has some dyspnea on exertion but no orthopnea or PND. Chronic bilateral pedal edema. No chest pain or syncope. Studies:  - LHC (09/2008): 3 v CAD, EF 65% => CABG  - Echo (08/2010): EF 55-60%, Gr 1 DD, mild LAE, MAC  - Carotid US (06/5007): RICA 3-81%; LICA 82-99% - f/u 2 years     Current Outpatient Prescriptions  Medication Sig Dispense Refill  . albuterol (PROVENTIL HFA;VENTOLIN HFA) 108 (90 BASE) MCG/ACT inhaler Inhale into the lungs every 6 (six) hours as needed for wheezing or shortness of breath.      [name] amLODipine (NORVASC) 10 MG tablet Take 1 tablet (10 mg total) by mouth daily.  90 tablet  0  . aspirin 81 MG tablet Take 81 mg by mouth daily.        [name] atorvastatin (LIPITOR) 40 MG tablet Take 1 tablet (40 mg total) by mouth daily.  90 tablet  3  . buPROPion (WELLBUTRIN XL) 150 MG 24 hr tablet Take 150 mg by mouth daily.      . diphenhydrAMINE (BENADRYL) 25 mg capsule Take 25 mg by mouth every 6 (six) hours as needed. For itch/rash      . docusate sodium (STOOL SOFTENER) 100 MG capsule Take 100 mg by mouth 2 (two) times daily.        . finasteride (PROSCAR) 5 MG tablet Take 1 tablet (5 mg total) by mouth daily.  90 tablet  3  . fluticasone (FLONASE) 50 MCG/ACT nasal spray Place 1 spray into both nostrils every morning.  16 g  3  . furosemide (LASIX) 40 MG tablet Take 1 tablet (40 mg total) by mouth daily.  30 tablet  6  . guaiFENesin (MUCINEX) 600 MG 12 hr tablet Take 1,200 mg by mouth 2 (two) times daily.        [name] oxyCODONE (OXY IR/ROXICODONE) 5 MG immediate release tablet Take 1.5 tablets (7.5 mg total) by mouth every 6 (six) hours as needed for severe pain. Do not until 09/26/13  180 tablet  0  . potassium  chloride SA (K-DUR,KLOR-CON) 20 MEQ tablet Take 1 tablet (20 mEq total) by mouth 2 (two) times daily.  60 tablet  3  . Sennosides 25 MG TABS Take 25 mg by mouth daily.       . tamsulosin (FLOMAX) 0.4 MG CAPS capsule Take 1 capsule (0.4 mg total) by mouth daily after supper.  90 capsule  3  . VENTOLIN HFA 108 (90 BASE) MCG/ACT inhaler INHALE 2 PUFFS INTO THE LUNGS EVERY 6 (SIX) HOURS AS NEEDED FOR WHEEZING.  18 each  2   No current facility-administered medications for this visit.     Past Medical History  Diagnosis Date  . 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 COPD (chronic obstructive pulmonary disease)   . Cerebrovascular disease   . AAA (abdominal aortic aneurysm)   . 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 Hyperlipidemia   . HTN (hypertension)   . CONGENITAL UNSPEC REDUCTION DEFORMITY LOWER LIMB   . TOBACCO USE, QUIT   . CHF (congestive heart failure)   . BPH (benign prostatic hypertrophy) with  urinary obstruction 07/2013  . Cirrhosis     on CT a/p 07/2013, no longer drinking  . Mobitz type 1 second degree atrioventricular block 09/06/2013    Past Surgical History  Procedure Laterality Date  . Coronary artery bypass graft  11/03/2008    Ricard Dillon - x4: left internal mammary artery to the distal left anterior descending, saphemous vein graft to the first circumflex marginal branch with a Y graft sequentiallly to a left posterolateral branch, saphenous vein graft to the distal right coronary artery    History   Social History  . Marital Status: Widowed    Spouse Name: N/A    Number of Children: N/A  . Years of Education: N/A   Occupational History  . Not on file.   Social History Main Topics  . Smoking status: Former Research scientist (life sciences)  . Smokeless tobacco: Never Used  . Alcohol Use: No     Comment: History of heavy alcohol use per pt. Quit many years ago1/1/ 2005  . Drug Use: No  . Sexual Activity: Not on file   Other Topics Concern  . Not on file     Social History Narrative   lives alone here in [place].  He has 1 son, who lives in the [place], and he has     an uncle, who lives nearby as well.     ROS: Back pain but no fevers or chills, productive cough, hemoptysis, dysphasia, odynophagia, melena, hematochezia, dysuria, hematuria, rash, seizure activity, orthopnea, PND, claudication. Remaining systems are negative.  Physical Exam: Well-developed well-nourished in no acute distress.  Skin is warm and dry.  HEENT is normal.  Neck is supple.  Chest is clear to auscultation with normal expansion.  Cardiovascular exam is regular rate and rhythm. 3/6 systolic murmur left sternal border. Abdominal exam nontender or distended. No masses palpated. Extremities show 1-2+ edema. neuro grossly intact  ECG Sinus rhythm, first degree AV block, frequent PVCs, nonspecific ST changes.

## 2013-12-13 NOTE — Assessment & Plan Note (Signed)
Continue aspirin and statin. Follow-up carotid Dopplers June 2016. 

## 2013-12-13 NOTE — Assessment & Plan Note (Signed)
Bilateral lower extremity edema. Question contribution from amlodipine. Discontinue. Patient instructed to keep feet elevated. Try compression hose. Continue present dose of Lasix. May need to increase in the future.

## 2013-12-13 NOTE — Assessment & Plan Note (Signed)
Patient has significant lower extremity edema. Question contribution from amlodipine. Discontinue. Add Cozaar 50 mg daily. Check potassium, renal function and BNP in one week.

## 2013-12-19 ENCOUNTER — Ambulatory Visit (HOSPITAL_COMMUNITY)
Admission: RE | Admit: 2013-12-19 | Discharge: 2013-12-19 | Disposition: A | Discharge status: Home / Self Care | Payer: Medicare Other | Source: Ambulatory Visit | Attending: Cardiology | Admitting: Cardiology

## 2013-12-19 DIAGNOSIS — R011 Cardiac murmur, unspecified: Secondary | ICD-10-CM | POA: Diagnosis not present

## 2013-12-19 DIAGNOSIS — I10 Essential (primary) hypertension: Secondary | ICD-10-CM | POA: Insufficient documentation

## 2013-12-19 DIAGNOSIS — I4891 Unspecified atrial fibrillation: Secondary | ICD-10-CM

## 2013-12-19 DIAGNOSIS — I2510 Atherosclerotic heart disease of native coronary artery without angina pectoris: Secondary | ICD-10-CM | POA: Insufficient documentation

## 2013-12-19 DIAGNOSIS — I359 Nonrheumatic aortic valve disorder, unspecified: Secondary | ICD-10-CM | POA: Diagnosis not present

## 2013-12-19 DIAGNOSIS — Z951 Presence of aortocoronary bypass graft: Secondary | ICD-10-CM | POA: Insufficient documentation

## 2013-12-19 DIAGNOSIS — N401 Enlarged prostate with lower urinary tract symptoms: Secondary | ICD-10-CM | POA: Diagnosis not present

## 2013-12-19 DIAGNOSIS — R0609 Other forms of dyspnea: Secondary | ICD-10-CM | POA: Diagnosis not present

## 2013-12-19 DIAGNOSIS — N138 Other obstructive and reflux uropathy: Secondary | ICD-10-CM | POA: Diagnosis not present

## 2013-12-19 DIAGNOSIS — I6529 Occlusion and stenosis of unspecified carotid artery: Secondary | ICD-10-CM | POA: Diagnosis not present

## 2013-12-19 DIAGNOSIS — R609 Edema, unspecified: Secondary | ICD-10-CM | POA: Diagnosis not present

## 2013-12-19 DIAGNOSIS — R972 Elevated prostate specific antigen [PSA]: Secondary | ICD-10-CM | POA: Diagnosis not present

## 2013-12-19 DIAGNOSIS — R0989 Other specified symptoms and signs involving the circulatory and respiratory systems: Secondary | ICD-10-CM | POA: Diagnosis not present

## 2013-12-19 DIAGNOSIS — N312 Flaccid neuropathic bladder, not elsewhere classified: Secondary | ICD-10-CM | POA: Diagnosis not present

## 2013-12-19 NOTE — Progress Notes (Signed)
2D Echo Performed 12/19/2013    Marygrace Drought, RCS

## 2013-12-23 ENCOUNTER — Other Ambulatory Visit: Payer: Self-pay | Admitting: Urology

## 2013-12-27 ENCOUNTER — Other Ambulatory Visit: Payer: Self-pay | Admitting: *Deleted

## 2013-12-27 MED ORDER — OXYCODONE HCL 5 MG PO TABS: 7.5000 mg | ORAL_TABLET | Freq: Four times a day (QID) | ORAL | Status: DC | PRN

## 2013-12-27 NOTE — Telephone Encounter (Signed)
Pt called to check up on this request. Please advise.

## 2013-12-27 NOTE — Telephone Encounter (Signed)
Requesting refill on his oxycodone...[name]

## 2013-12-27 NOTE — Telephone Encounter (Signed)
Notified pt rx ready for pick-up.../lmb 

## 2014-01-11 ENCOUNTER — Telehealth: Payer: Self-pay | Admitting: Cardiology

## 2014-01-11 DIAGNOSIS — R609 Edema, unspecified: Secondary | ICD-10-CM

## 2014-01-11 DIAGNOSIS — R0602 Shortness of breath: Secondary | ICD-10-CM

## 2014-01-11 NOTE — Telephone Encounter (Signed)
Spoke with pt, he is looking for the lab work from august. Results not in system. Spoke with [facility] lab, they are going to fax the results.

## 2014-01-11 NOTE — Telephone Encounter (Signed)
Spoke with pt, [facility] can not find his blood from the august draw. Lab orders placed and pt is going to go to the [facility] lab to have drawn.

## 2014-01-11 NOTE — Telephone Encounter (Signed)
New Message  Pt called for lab results. Please call

## 2014-01-13 ENCOUNTER — Telehealth: Payer: Self-pay | Admitting: Cardiology

## 2014-01-13 ENCOUNTER — Other Ambulatory Visit (INDEPENDENT_AMBULATORY_CARE_PROVIDER_SITE_OTHER): Payer: Medicare Other

## 2014-01-13 DIAGNOSIS — R0609 Other forms of dyspnea: Secondary | ICD-10-CM | POA: Diagnosis not present

## 2014-01-13 DIAGNOSIS — R609 Edema, unspecified: Secondary | ICD-10-CM

## 2014-01-13 DIAGNOSIS — R0989 Other specified symptoms and signs involving the circulatory and respiratory systems: Secondary | ICD-10-CM

## 2014-01-13 DIAGNOSIS — R0602 Shortness of breath: Secondary | ICD-10-CM

## 2014-01-13 DIAGNOSIS — Z23 Encounter for immunization: Secondary | ICD-10-CM | POA: Diagnosis not present

## 2014-01-13 DIAGNOSIS — R339 Retention of urine, unspecified: Secondary | ICD-10-CM | POA: Diagnosis not present

## 2014-01-13 LAB — BASIC METABOLIC PANEL
BUN: 20 mg/dL (ref 6–23)
CO2: 28 mEq/L (ref 19–32)
Calcium: 9.1 mg/dL (ref 8.4–10.5)
Chloride: 103 mEq/L (ref 96–112)
Creatinine, Ser: 1 mg/dL (ref 0.4–1.5)
GFR: 76.48 mL/min (ref >60.00)
Glucose, Bld: 72 mg/dL (ref 70–99)
Potassium: 3.9 mEq/L (ref 3.5–5.1)
Sodium: 138 mEq/L (ref 135–145)

## 2014-01-13 LAB — BRAIN NATRIURETIC PEPTIDE: Pro B Natriuretic peptide (BNP): 161 pg/mL — ABNORMAL HIGH (ref 0.0–100.0)

## 2014-01-13 NOTE — Telephone Encounter (Signed)
°  Mary from the lab called she drew patients blood, but needs a correct order. Please call her @ [phone]

## 2014-01-17 ENCOUNTER — Telehealth: Payer: Self-pay | Admitting: Cardiology

## 2014-01-17 NOTE — Telephone Encounter (Signed)
New message     Pt is on 81mg  of aspirin.  If he needs a tooth extraction, should he stop the aspirin?

## 2014-01-17 NOTE — Telephone Encounter (Signed)
Spoke with pt, aware it will be up to the dentist if he needs to hold the aspirin or not. He is okay to hold if needed

## 2014-01-24 ENCOUNTER — Telehealth: Payer: Self-pay | Admitting: Internal Medicine

## 2014-01-24 MED ORDER — OXYCODONE HCL 5 MG PO TABS: 7.5000 mg | ORAL_TABLET | Freq: Four times a day (QID) | ORAL | Status: DC | PRN

## 2014-01-24 NOTE — Telephone Encounter (Signed)
Patient is requesting script for oxycodone °

## 2014-02-02 ENCOUNTER — Encounter (HOSPITAL_COMMUNITY): Payer: Self-pay | Admitting: Pharmacy Technician

## 2014-02-07 ENCOUNTER — Encounter (HOSPITAL_COMMUNITY): Payer: Self-pay

## 2014-02-07 ENCOUNTER — Ambulatory Visit (HOSPITAL_COMMUNITY)
Admission: RE | Admit: 2014-02-07 | Discharge: 2014-02-07 | Disposition: A | Discharge status: Home / Self Care | Payer: Medicare Other | Source: Ambulatory Visit | Attending: Anesthesiology | Admitting: Anesthesiology

## 2014-02-07 ENCOUNTER — Encounter: Payer: Self-pay | Admitting: Internal Medicine

## 2014-02-07 ENCOUNTER — Encounter (HOSPITAL_COMMUNITY)
Admission: RE | Admit: 2014-02-07 | Discharge: 2014-02-07 | Disposition: A | Discharge status: Home / Self Care | Payer: Medicare Other | Source: Ambulatory Visit | Attending: Urology | Admitting: Urology

## 2014-02-07 ENCOUNTER — Ambulatory Visit (INDEPENDENT_AMBULATORY_CARE_PROVIDER_SITE_OTHER): Payer: Medicare Other | Admitting: Internal Medicine

## 2014-02-07 VITALS — BP 116/68 | HR 70 | Temp 97.4°F | Resp 12 | Ht 72.0 in | Wt 206.0 lb

## 2014-02-07 DIAGNOSIS — I6529 Occlusion and stenosis of unspecified carotid artery: Secondary | ICD-10-CM | POA: Diagnosis not present

## 2014-02-07 DIAGNOSIS — M545 Low back pain: Secondary | ICD-10-CM | POA: Diagnosis not present

## 2014-02-07 DIAGNOSIS — I2510 Atherosclerotic heart disease of native coronary artery without angina pectoris: Secondary | ICD-10-CM

## 2014-02-07 DIAGNOSIS — J449 Chronic obstructive pulmonary disease, unspecified: Secondary | ICD-10-CM | POA: Diagnosis not present

## 2014-02-07 DIAGNOSIS — C61 Malignant neoplasm of prostate: Secondary | ICD-10-CM | POA: Diagnosis not present

## 2014-02-07 DIAGNOSIS — I25810 Atherosclerosis of coronary artery bypass graft(s) without angina pectoris: Secondary | ICD-10-CM

## 2014-02-07 DIAGNOSIS — I714 Abdominal aortic aneurysm, without rupture: Secondary | ICD-10-CM | POA: Diagnosis not present

## 2014-02-07 DIAGNOSIS — I10 Essential (primary) hypertension: Secondary | ICD-10-CM | POA: Diagnosis not present

## 2014-02-07 DIAGNOSIS — Z01818 Encounter for other preprocedural examination: Secondary | ICD-10-CM | POA: Insufficient documentation

## 2014-02-07 DIAGNOSIS — Z951 Presence of aortocoronary bypass graft: Secondary | ICD-10-CM | POA: Insufficient documentation

## 2014-02-07 DIAGNOSIS — J984 Other disorders of lung: Secondary | ICD-10-CM | POA: Diagnosis not present

## 2014-02-07 DIAGNOSIS — M549 Dorsalgia, unspecified: Secondary | ICD-10-CM

## 2014-02-07 DIAGNOSIS — E785 Hyperlipidemia, unspecified: Secondary | ICD-10-CM

## 2014-02-07 DIAGNOSIS — G8929 Other chronic pain: Secondary | ICD-10-CM

## 2014-02-07 HISTORY — DX: Shortness of breath: R06.02

## 2014-02-07 HISTORY — DX: Occlusion and stenosis of unspecified carotid artery: I65.29

## 2014-02-07 HISTORY — DX: Pain, unspecified: R52

## 2014-02-07 HISTORY — DX: Blindness, one eye, unspecified eye: H54.40

## 2014-02-07 HISTORY — DX: Myoneural disorder, unspecified: G70.9

## 2014-02-07 HISTORY — DX: Presence of urogenital implants: Z96.0

## 2014-02-07 LAB — COMPREHENSIVE METABOLIC PANEL
ALT: 25 U/L (ref 0–53)
AST: 25 U/L (ref 0–37)
Albumin: 3.7 g/dL (ref 3.5–5.2)
Alkaline Phosphatase: 105 U/L (ref 39–117)
Anion gap: 10 (ref 5–15)
BUN: 19 mg/dL (ref 6–23)
CO2: 28 mEq/L (ref 19–32)
Calcium: 9 mg/dL (ref 8.4–10.5)
Chloride: 103 mEq/L (ref 96–112)
Creatinine, Ser: 1.1 mg/dL (ref 0.50–1.35)
GFR calc Af Amer: 76 mL/min — ABNORMAL LOW (ref >90)
GFR calc non Af Amer: 66 mL/min — ABNORMAL LOW (ref >90)
Glucose, Bld: 110 mg/dL — ABNORMAL HIGH (ref 70–99)
Potassium: 4.5 mEq/L (ref 3.7–5.3)
Sodium: 141 mEq/L (ref 137–147)
Total Bilirubin: 1 mg/dL (ref 0.3–1.2)
Total Protein: 7.7 g/dL (ref 6.0–8.3)

## 2014-02-07 LAB — CBC
HCT: 40 % (ref 39.0–52.0)
Hemoglobin: 12.9 g/dL — ABNORMAL LOW (ref 13.0–17.0)
MCH: 28.5 pg (ref 26.0–34.0)
MCHC: 32.3 g/dL (ref 30.0–36.0)
MCV: 88.3 fL (ref 78.0–100.0)
Platelets: 174 10*3/uL (ref 150–400)
RBC: 4.53 MIL/uL (ref 4.22–5.81)
RDW: 15.2 % (ref 11.5–15.5)
WBC: 5.4 10*3/uL (ref 4.0–10.5)

## 2014-02-07 LAB — PROTIME-INR
INR: 1.17 (ref 0.00–1.49)
Prothrombin Time: 15 seconds (ref 11.6–15.2)

## 2014-02-07 LAB — APTT: aPTT: 29 seconds (ref 24–37)

## 2014-02-07 MED ORDER — OXYCODONE HCL ER 15 MG PO T12A: 15.0000 mg | EXTENDED_RELEASE_TABLET | Freq: Two times a day (BID) | ORAL | Status: DC

## 2014-02-07 MED ORDER — NALOXEGOL OXALATE 25 MG PO TABS: 25.0000 mg | ORAL_TABLET | Freq: Every day | ORAL | Status: DC

## 2014-02-07 NOTE — Patient Instructions (Addendum)
REMEMBER TO STOP YOUR ASPIRIN  YOUR SURGERY IS SCHEDULED AT [facility]  ON:  Tuesday  10/27  REPORT TO  SHORT STAY CENTER AT:  5:30 AM   DO NOT EAT OR DRINK ANYTHING AFTER MIDNIGHT THE NIGHT BEFORE YOUR SURGERY.  YOU MAY BRUSH YOUR TEETH, RINSE OUT YOUR MOUTH--BUT NO WATER, NO FOOD, NO CHEWING GUM, NO MINTS, NO CANDIES, NO CHEWING TOBACCO.  PLEASE TAKE THE FOLLOWING MEDICATIONS THE AM OF YOUR SURGERY WITH A FEW SIPS OF WATER:  NO MEDS TO TAKE - EXCEPT USE YOUR ALBUTEROL INHALER.   DO NOT BRING VALUABLES, MONEY, CREDIT CARDS.  DO NOT WEAR JEWELRY, MAKE-UP, NAIL POLISH AND NO METAL PINS OR CLIPS IN YOUR HAIR. CONTACT LENS, DENTURES / PARTIALS, GLASSES SHOULD NOT BE WORN TO SURGERY AND IN MOST CASES-HEARING AIDS WILL NEED TO BE REMOVED.  BRING YOUR GLASSES CASE, ANY EQUIPMENT NEEDED FOR YOUR CONTACT LENS. FOR PATIENTS ADMITTED TO THE HOSPITAL--CHECK OUT TIME THE DAY OF DISCHARGE IS 11:00 AM.  ALL INPATIENT ROOMS ARE PRIVATE - WITH BATHROOM, TELEPHONE, TELEVISION AND WIFI INTERNET.  IF YOU ARE BEING DISCHARGED THE SAME DAY OF YOUR SURGERY--YOU CAN NOT DRIVE YOURSELF HOME--AND SHOULD NOT GO HOME ALONE BY TAXI OR BUS.  NO DRIVING OR OPERATING MACHINERY, OR MAKING LEGAL DECISIONS FOR 24 HOURS FOLLOWING ANESTHESIA / PAIN MEDICATIONS.  PLEASE MAKE ARRANGEMENTS FOR SOMEONE TO BE WITH YOU AT HOME THE FIRST 24 HOURS AFTER SURGERY. RESPONSIBLE DRIVER'S NAME / PHONE  PT'S SON WILL BE WITH HIM                                                     PLEASE BE AWARE THAT YOU MAY NEED ADDITIONAL BLOOD DRAWN DAY OF YOUR SURGERY  _______________________________________________________________________   [facility] - Preparing for Surgery Before surgery, you can play an important role.  Because skin is not sterile, your skin needs to be as free of germs as possible.  You can reduce the number of germs on your skin by washing with CHG (chlorahexidine gluconate) soap before surgery.  CHG is an  antiseptic cleaner which kills germs and bonds with the skin to continue killing germs even after washing. Please DO NOT use if you have an allergy to CHG or antibacterial soaps.  If your skin becomes reddened/irritated stop using the CHG and inform your nurse when you arrive at Short Stay. Do not shave (including legs and underarms) for at least 48 hours prior to the first CHG shower.  You may shave your face/neck. Please follow these instructions carefully:  1.  Shower with CHG Soap the night before surgery and the  morning of Surgery.  2.  If you choose to wash your hair, wash your hair first as usual with your  normal  shampoo.  3.  After you shampoo, rinse your hair and body thoroughly to remove the  shampoo.                           4.  Use CHG as you would any other liquid soap.  You can apply chg directly  to the skin and wash                       Gently with a scrungie or clean  washcloth.  5.  Apply the CHG Soap to your body ONLY FROM THE NECK DOWN.   Do not use on face/ open                           Wound or open sores. Avoid contact with eyes, ears mouth and genitals (private parts).                       Wash face,  Genitals (private parts) with your normal soap.             6.  Wash thoroughly, paying special attention to the area where your surgery  will be performed.  7.  Thoroughly rinse your body with warm water from the neck down.  8.  DO NOT shower/wash with your normal soap after using and rinsing off  the CHG Soap.                9.  Pat yourself dry with a clean towel.            10.  Wear clean pajamas.            11.  Place clean sheets on your bed the night of your first shower and do not  sleep with pets. Day of Surgery : Do not apply any lotions/deodorants the morning of surgery.  Please wear clean clothes to the hospital/surgery center.  FAILURE TO FOLLOW THESE INSTRUCTIONS MAY RESULT IN THE CANCELLATION OF YOUR SURGERY PATIENT  SIGNATURE_________________________________  NURSE SIGNATURE__________________________________  ________________________________________________________________________

## 2014-02-07 NOTE — Progress Notes (Signed)
Pre visit review using our clinic review tool, if applicable. No additional management support is needed unless otherwise documented below in the visit note. 

## 2014-02-07 NOTE — Progress Notes (Signed)
02/07/14 1306  OBSTRUCTIVE SLEEP APNEA  Have you ever been diagnosed with sleep apnea through a sleep study? No  Do you snore loudly (loud enough to be heard through closed doors)?  0  Do you often feel tired, fatigued, or sleepy during the daytime? 1  Has anyone observed you stop breathing during your sleep? 1  Do you have, or are you being treated for high blood pressure? 1  BMI more than 35 kg/m2? 0  Age over 71 years old? 1  Neck circumference greater than 40 cm/16 inches? 1  Gender: 1  Obstructive Sleep Apnea Score 6  Score 4 or greater  Results sent to PCP

## 2014-02-07 NOTE — Pre-Procedure Instructions (Signed)
PT HAS EKG AND CARDIOLOGY OFFICE NOTE IN EPIC FROM DR. CRENSHAW FROM 12/13/13. CXR WAS DONE TODAY - PREOP AT [facility].

## 2014-02-07 NOTE — Patient Instructions (Signed)
We will send you to the back doctor to see if they can help out with your pain and find out the cause.   We will have you start taking oxycontin for the pain which should work better. Take 1 pill in the morning and 1 pill in the evening.   Everyday take movantik to help with constipation. If you start to have loose stools stop taking the stool softener and continue movantik. If you have any problems getting the medicine please call us.   Come back in about 3 months so we can find out how the pain is doing.

## 2014-02-08 ENCOUNTER — Telehealth: Payer: Self-pay | Admitting: Internal Medicine

## 2014-02-08 NOTE — Progress Notes (Signed)
   Subjective:    Patient ID: [name], male    DOB: [birth year], 73 y.o.   MRN: [id number]  HPI The patient is a 73 YO man who is establishing care today. He is also constipated from his pain medications. He has to use laxatives on top of stool softeners to go. He is not sure if it will be hard or will be too much when he goes. He takes the pain medicine for his back although he is not clear what is wrong with his back. He states that he has some discrepancy in his leg length and this causes some of the pain. He also has PMH of CAD (post CABG), HTN, tobacco history (quit), COPD. He denies anginal pains, SOB, abdominal pain. He states also that his oxycodone (takes 7.5 mg QID) is not quite doing enough lately and he feels he is chasing the pain. He is still passing gas and has moved his bowels already this week.  Review of Systems  Constitutional: Negative for fever, activity change, appetite change, fatigue and unexpected weight change.  HENT: Negative.   Eyes: Negative.   Respiratory: Negative for cough, chest tightness, shortness of breath and wheezing.   Cardiovascular: Negative for chest pain, palpitations and leg swelling.  Gastrointestinal: Positive for constipation. Negative for nausea, vomiting, abdominal pain, diarrhea and abdominal distention.  Genitourinary: Negative for difficulty urinating.  Musculoskeletal: Positive for back pain. Negative for gait problem and myalgias.  Skin: Negative.   Neurological: Negative for dizziness, weakness, light-headedness, numbness and headaches.      Objective:   Physical Exam  Constitutional: He is oriented to person, place, and time. He appears well-developed and well-nourished.  Overweight  HENT:  Head: Normocephalic and atraumatic.  Eyes: EOM are normal.  Neck: Normal range of motion.  Cardiovascular: Normal rate and regular rhythm.   CABG scar  Pulmonary/Chest: Effort normal and breath sounds normal. No respiratory distress. He  has no wheezes. He has no rales.  Abdominal: Soft. Bowel sounds are normal. He exhibits no distension. There is no tenderness. There is no rebound.  Musculoskeletal:  Minimal tenderness in the lumbar region  Neurological: He is alert and oriented to person, place, and time. Coordination normal.  Skin: Skin is warm and dry.   Filed Vitals:   02/07/14 1539  BP: 116/68  Pulse: 70  Temp: 97.4 F (36.3 C)  TempSrc: Oral  Resp: 12  Height: 6' (1.829 m)  Weight: 206 lb (93.441 kg)  SpO2: 93%      Assessment & Plan:

## 2014-02-08 NOTE — Assessment & Plan Note (Signed)
Continue statin. 

## 2014-02-08 NOTE — Assessment & Plan Note (Signed)
It is unclear what the cause of his back pain is and he has never had imaging of his back to show dysfunction. Will send him to orthopedic surgery to help clarify if his leg discrepancy could be causing this problem and seeing if any other treatment options other than narcotics may help his back. Will give rx for oxycontin 15 mg BID # 60 no refills. Currently he is chasing pain and not doing very well. Also given rx for movantik for the opioid constipation.

## 2014-02-08 NOTE — Assessment & Plan Note (Signed)
BP well controlled at this time. Continue losartan, lasix.

## 2014-02-08 NOTE — Assessment & Plan Note (Signed)
Denies angina, takes ASA, statin, ACE-I.

## 2014-02-08 NOTE — Telephone Encounter (Signed)
Patient states insurance company should be sending over fax confirming that patient is in need of two meds (he does not know the name of - one is a laxative and the other is a time release oxycodone).  He wanted to make sure it was received and sent back.

## 2014-02-08 NOTE — Telephone Encounter (Signed)
Patient called back.  He states insurance will not cover the time release tab.  He can not afford it.  He states he has already thrown away the oxycodone.  He is in need of a pain med.

## 2014-02-09 ENCOUNTER — Ambulatory Visit: Payer: Medicare Other | Admitting: Internal Medicine

## 2014-02-09 ENCOUNTER — Telehealth: Payer: Self-pay | Admitting: Internal Medicine

## 2014-02-09 NOTE — Telephone Encounter (Signed)
° °   Patient son called back. He states insurance will not cover the time release tab. He can not afford it. He states he has already thrown away the oxycodone. He is in need of a pain med.    Could it be called into his pharmacy today, he is in a lot of pain.Thank you

## 2014-02-09 NOTE — Telephone Encounter (Signed)
Is there something else this patient can take?

## 2014-02-09 NOTE — Telephone Encounter (Signed)
[name] please call pt and let him know the status on PA...[name]

## 2014-02-09 NOTE — Telephone Encounter (Signed)
Patient son called back,

## 2014-02-10 ENCOUNTER — Other Ambulatory Visit: Payer: Self-pay | Admitting: Geriatric Medicine

## 2014-02-10 DIAGNOSIS — N401 Enlarged prostate with lower urinary tract symptoms: Secondary | ICD-10-CM | POA: Diagnosis not present

## 2014-02-10 DIAGNOSIS — R338 Other retention of urine: Secondary | ICD-10-CM | POA: Diagnosis not present

## 2014-02-10 MED ORDER — OXYCODONE HCL 5 MG PO CAPS: 7.5000 mg | ORAL_CAPSULE | Freq: Four times a day (QID) | ORAL | Status: DC | PRN

## 2014-02-10 NOTE — Telephone Encounter (Signed)
I have not seen any forms 

## 2014-02-10 NOTE — Telephone Encounter (Signed)
Left message for patient to call me back. 

## 2014-02-10 NOTE — Telephone Encounter (Signed)
Patient stated that he has sent several messages and haven't heard anything back, he was stopping by the office today.

## 2014-02-10 NOTE — Addendum Note (Signed)
Addended by: Vertell Novak A on: 02/10/2014 03:24 PM   Modules accepted: Orders, Medications

## 2014-02-10 NOTE — Telephone Encounter (Signed)
I definitely did not tell him to throw away his oxycodone, did we get the form from insurance so we can get him the oxycontin?

## 2014-02-13 NOTE — Anesthesia Preprocedure Evaluation (Addendum)
Anesthesia Evaluation  Patient identified by MRN, date of birth, ID band Patient awake    Reviewed: Allergy & Precautions, H&P , NPO status , Patient's Chart, lab work & pertinent test results  Airway Mallampati: II  TM Distance: >3 FB Neck ROM: full    Dental  (+) Dental Advisory Given, Edentulous Upper, Edentulous Lower   Pulmonary shortness of breath and with exertion, COPDformer smoker,  breath sounds clear to auscultation  Pulmonary exam normal       Cardiovascular Exercise Tolerance: Poor hypertension, Pt. on medications + CAD, + Past MI, + CABG, + Peripheral Vascular Disease and +CHF Rhythm:regular Rate:Normal  AAA. Mobitz type 1 second degree AV block.   Neuro/Psych Carotid stenosis.  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   Endo/Other  negative endocrine ROS  Renal/GU negative Renal ROS  negative genitourinary   Musculoskeletal   Abdominal   Peds  Hematology negative hematology ROS (+)   Anesthesia Other Findings   Reproductive/Obstetrics negative OB ROS                            Anesthesia Physical Anesthesia Plan  ASA: IV  Anesthesia Plan: General   Post-op Pain Management:    Induction: Intravenous  Airway Management Planned: Oral ETT  Additional Equipment:   Intra-op Plan:   Post-operative Plan: Extubation in OR  Informed Consent: I have reviewed the patients History and Physical, chart, labs and discussed the procedure including the risks, benefits and alternatives for the proposed anesthesia with the patient or authorized representative who has indicated his/her understanding and acceptance.   Dental Advisory Given  Plan Discussed with: CRNA and  Surgeon  Anesthesia Plan Comments:         Anesthesia Quick Evaluation

## 2014-02-14 ENCOUNTER — Ambulatory Visit (HOSPITAL_COMMUNITY)
Admission: RE | Admit: 2014-02-14 | Discharge: 2014-02-14 | Disposition: A | Discharge status: Home / Self Care | Payer: Medicare Other | Source: Ambulatory Visit | Attending: Urology | Admitting: Urology

## 2014-02-14 ENCOUNTER — Encounter (HOSPITAL_COMMUNITY): Payer: Self-pay

## 2014-02-14 ENCOUNTER — Encounter (HOSPITAL_COMMUNITY)
Admission: RE | Disposition: A | Discharge status: Home / Self Care | Payer: Self-pay | Source: Ambulatory Visit | Attending: Urology

## 2014-02-14 ENCOUNTER — Ambulatory Visit (HOSPITAL_COMMUNITY): Payer: Medicare Other | Admitting: Anesthesiology

## 2014-02-14 ENCOUNTER — Encounter (HOSPITAL_COMMUNITY): Payer: Medicare Other | Admitting: Anesthesiology

## 2014-02-14 DIAGNOSIS — E785 Hyperlipidemia, unspecified: Secondary | ICD-10-CM | POA: Diagnosis not present

## 2014-02-14 DIAGNOSIS — N312 Flaccid neuropathic bladder, not elsewhere classified: Secondary | ICD-10-CM | POA: Diagnosis not present

## 2014-02-14 DIAGNOSIS — Z7982 Long term (current) use of aspirin: Secondary | ICD-10-CM | POA: Diagnosis not present

## 2014-02-14 DIAGNOSIS — Z79899 Other long term (current) drug therapy: Secondary | ICD-10-CM | POA: Insufficient documentation

## 2014-02-14 DIAGNOSIS — J449 Chronic obstructive pulmonary disease, unspecified: Secondary | ICD-10-CM | POA: Diagnosis not present

## 2014-02-14 DIAGNOSIS — I509 Heart failure, unspecified: Secondary | ICD-10-CM | POA: Diagnosis not present

## 2014-02-14 DIAGNOSIS — R338 Other retention of urine: Secondary | ICD-10-CM | POA: Insufficient documentation

## 2014-02-14 DIAGNOSIS — K7460 Unspecified cirrhosis of liver: Secondary | ICD-10-CM | POA: Diagnosis not present

## 2014-02-14 DIAGNOSIS — N401 Enlarged prostate with lower urinary tract symptoms: Secondary | ICD-10-CM | POA: Diagnosis not present

## 2014-02-14 DIAGNOSIS — Z87891 Personal history of nicotine dependence: Secondary | ICD-10-CM | POA: Insufficient documentation

## 2014-02-14 DIAGNOSIS — I10 Essential (primary) hypertension: Secondary | ICD-10-CM | POA: Diagnosis not present

## 2014-02-14 DIAGNOSIS — I252 Old myocardial infarction: Secondary | ICD-10-CM | POA: Diagnosis not present

## 2014-02-14 DIAGNOSIS — I2510 Atherosclerotic heart disease of native coronary artery without angina pectoris: Secondary | ICD-10-CM | POA: Diagnosis not present

## 2014-02-14 DIAGNOSIS — I739 Peripheral vascular disease, unspecified: Secondary | ICD-10-CM | POA: Diagnosis not present

## 2014-02-14 DIAGNOSIS — Z8673 Personal history of transient ischemic attack (TIA), and cerebral infarction without residual deficits: Secondary | ICD-10-CM | POA: Diagnosis not present

## 2014-02-14 DIAGNOSIS — Z951 Presence of aortocoronary bypass graft: Secondary | ICD-10-CM | POA: Insufficient documentation

## 2014-02-14 HISTORY — DX: Adverse effect of other opioids, initial encounter: T40.2X5A

## 2014-02-14 HISTORY — DX: Drug induced constipation: K59.03

## 2014-02-14 HISTORY — PX: GREEN LIGHT LASER TURP (TRANSURETHRAL RESECTION OF PROSTATE: SHX6260

## 2014-02-14 SURGERY — GREEN LIGHT LASER TURP (TRANSURETHRAL RESECTION OF PROSTATE
Anesthesia: General | Site: Prostate

## 2014-02-14 MED ORDER — FENTANYL CITRATE 0.05 MG/ML IJ SOLN
INTRAMUSCULAR | Status: DC | PRN
  Administered 2014-02-14 (×2): 50 ug via INTRAVENOUS
  Administered 2014-02-14: 25 ug via INTRAVENOUS
  Administered 2014-02-14: 50 ug via INTRAVENOUS
  Administered 2014-02-14: 25 ug via INTRAVENOUS

## 2014-02-14 MED ORDER — SODIUM CHLORIDE 0.9 % IV SOLN
INTRAVENOUS | Status: AC
  Filled 2014-02-14: qty 1.5

## 2014-02-14 MED ORDER — LIDOCAINE HCL (CARDIAC) 20 MG/ML IV SOLN
INTRAVENOUS | Status: DC | PRN
  Administered 2014-02-14: 40 mg via INTRAVENOUS

## 2014-02-14 MED ORDER — ALBUTEROL SULFATE HFA 108 (90 BASE) MCG/ACT IN AERS
INHALATION_SPRAY | RESPIRATORY_TRACT | Status: AC
  Filled 2014-02-14: qty 6.7

## 2014-02-14 MED ORDER — FENTANYL CITRATE 0.05 MG/ML IJ SOLN
25.0000 ug | INTRAMUSCULAR | Status: DC | PRN
  Administered 2014-02-14 (×3): 50 ug via INTRAVENOUS

## 2014-02-14 MED ORDER — LACTATED RINGERS IV SOLN: INTRAVENOUS | Status: DC

## 2014-02-14 MED ORDER — FENTANYL CITRATE 0.05 MG/ML IJ SOLN
INTRAMUSCULAR | Status: AC
  Filled 2014-02-14: qty 2

## 2014-02-14 MED ORDER — SODIUM CHLORIDE 0.9 % IV SOLN
1.5000 g | INTRAVENOUS | Status: AC
  Administered 2014-02-14: 1.5 g via INTRAVENOUS

## 2014-02-14 MED ORDER — SODIUM CHLORIDE 0.9 % IR SOLN
Status: DC | PRN
  Administered 2014-02-14: 13000 mL

## 2014-02-14 MED ORDER — ASPIRIN 81 MG PO TABS: 81.0000 mg | ORAL_TABLET | Freq: Once | ORAL | Status: DC

## 2014-02-14 MED ORDER — ONDANSETRON HCL 4 MG/2ML IJ SOLN
INTRAMUSCULAR | Status: AC
  Filled 2014-02-14: qty 2

## 2014-02-14 MED ORDER — ONDANSETRON HCL 4 MG/2ML IJ SOLN
INTRAMUSCULAR | Status: DC | PRN
  Administered 2014-02-14: 4 mg via INTRAVENOUS

## 2014-02-14 MED ORDER — LACTATED RINGERS IV SOLN
INTRAVENOUS | Status: DC | PRN
  Administered 2014-02-14: 07:00:00 via INTRAVENOUS

## 2014-02-14 MED ORDER — POLYETHYLENE GLYCOL 3350 17 G PO PACK: 17.0000 g | PACK | Freq: Every day | ORAL | Status: DC

## 2014-02-14 MED ORDER — ALBUTEROL SULFATE HFA 108 (90 BASE) MCG/ACT IN AERS
INHALATION_SPRAY | RESPIRATORY_TRACT | Status: DC | PRN
  Administered 2014-02-14: 2 via RESPIRATORY_TRACT

## 2014-02-14 MED ORDER — PROPOFOL 10 MG/ML IV BOLUS
INTRAVENOUS | Status: AC
  Filled 2014-02-14: qty 20

## 2014-02-14 MED ORDER — PROPOFOL 10 MG/ML IV BOLUS
INTRAVENOUS | Status: DC | PRN
  Administered 2014-02-14: 200 mg via INTRAVENOUS
  Administered 2014-02-14: 30 mg via INTRAVENOUS

## 2014-02-14 MED ORDER — HYDROMORPHONE HCL 2 MG/ML IJ SOLN
INTRAMUSCULAR | Status: AC
  Filled 2014-02-14: qty 1

## 2014-02-14 MED ORDER — LEVOFLOXACIN IN D5W 500 MG/100ML IV SOLN
500.0000 mg | Freq: Once | INTRAVENOUS | Status: AC
  Administered 2014-02-14: 500 mg via INTRAVENOUS
  Filled 2014-02-14: qty 100

## 2014-02-14 MED ORDER — LIDOCAINE HCL (CARDIAC) 20 MG/ML IV SOLN
INTRAVENOUS | Status: AC
  Filled 2014-02-14: qty 5

## 2014-02-14 MED ORDER — HYDROMORPHONE HCL 1 MG/ML IJ SOLN
INTRAMUSCULAR | Status: DC | PRN
  Administered 2014-02-14 (×2): 0.5 mg via INTRAVENOUS

## 2014-02-14 MED ORDER — LEVOFLOXACIN 250 MG PO TABS: 250.0000 mg | ORAL_TABLET | Freq: Every day | ORAL | Status: DC

## 2014-02-14 SURGICAL SUPPLY — 19 items
BAG URINE DRAINAGE (UROLOGICAL SUPPLIES) ×3 IMPLANT
BAG URO CATCHER STRL LF (DRAPE) ×3 IMPLANT
CATH TIEMANN FOLEY 18FR 5CC (CATHETERS) ×3 IMPLANT
DRAPE CAMERA CLOSED 9X96 (DRAPES) ×3 IMPLANT
ELECT BUTTON HF 24-28F 2 30DE (ELECTRODE) IMPLANT
ELECT LOOP MED HF 24F 12D (CUTTING LOOP) IMPLANT
ELECT LOOP MED HF 24F 12D CBL (CLIP) IMPLANT
ELECT RESECT VAPORIZE 12D CBL (ELECTRODE) IMPLANT
GLOVE BIOGEL M STRL SZ7.5 (GLOVE) ×3 IMPLANT
GOWN STRL REUS W/TWL XL LVL3 (GOWN DISPOSABLE) ×3 IMPLANT
HOLDER FOLEY CATH W/STRAP (MISCELLANEOUS) ×3 IMPLANT
LASER FIBER /GREENLIGHT LASER (Laser) ×3 IMPLANT
LASER GREENLIGHT RENTAL P/PROC (Laser) ×3 IMPLANT
MANIFOLD NEPTUNE II (INSTRUMENTS) ×3 IMPLANT
PACK CYSTO (CUSTOM PROCEDURE TRAY) ×3 IMPLANT
SYR 30ML LL (SYRINGE) IMPLANT
SYRINGE IRR TOOMEY STRL 70CC (SYRINGE) ×3 IMPLANT
TUBING CONNECTING 10 (TUBING) ×2 IMPLANT
TUBING CONNECTING 10' (TUBING) ×1

## 2014-02-14 NOTE — Op Note (Signed)
Preoperative diagnosis: BPH, urinary retention, hypotonic bladder Postoperative diagnosis: BPH, urinary retention, hypotonic bladder  Procedure: Exam under anesthesia, Greenlight photo vaporization of the prostate  Surgeon: Junious Silk  Anesthesia: Gen.  Findings: Exam under anesthesia-there was traumatic coronal hypospadias. The penis was without mass or lesion. The testicles were descended bilaterally and palpably normal. On digital rectal exam the prostate was mildly enlarged but was smooth without hard area or nodule.  On cystoscopy the urethra appeared normal. There was trilobar hypertrophy with a elongated prostatic urethra and an elevated bladder neck. The trigone ureteral orifices were in their normal orthotopic position and there was excellent clear efflux. The UOs were normal and without injury after all lasering. The bladder mucosa appeared normal. There was a small amount of erythema and cystitis cystica in the posterior midline consistent with the prior catheter. There were no papillary tumors. There were no stones or foreign bodies. There was mild trabeculation.  Laser statistics: Time 32 minutes and 32 seconds. Joules 329,924, lots 80-160.  Description of procedure: After consent was obtained the patient brought in the operating room. After adequate anesthesia his place a lithotomy position and prepped and draped in the usual sterile fashion. A timeout was performed to confirm the patient and procedure. I did an exam under anesthesia. The laser bridge and cystoscope passed per urethra. I tripled rinsed the bladder to decrease some of the expected colonization/bacteriuria. The bladder was carefully examined. I started at the apex and vaporized some of the apical tissue near the verumontanum to create a stopping point near the apex. I then went up and made incision at 5:00 and 7:00 down to the bladder neck. These incisions down to the prior apical incision at the veru. The median lobe was  then vaporized. Next the lateral lobes were vaporized through combination of bladder neck to apical movement and apical to bladder neck vaporization, anterior to posterior, posterior to anterior in a systematic sweeping fashion. Initial bladder neck and apical vaporization was carried out it with energy of 80 but most of the remainder of the vaporization was at 120-160. This created an excellent open channel from the verumontanum into the bladder. The bladder was drained and again there was an excellent patent prostatic urethra. The trigone and ureteral orifices were normal. There was excellent hemostasis. The bladder was filled and the scope removed. An 18 French Foley was placed. The balloon was seated at the bladder neck and left gravity drainage. The patient was awakened and taken to the recovery room in stable condition.  Blood loss: Minimal Specimens: None Complications: None  Drains: 18 French Foley  Disposition: Patient stable to PACU

## 2014-02-14 NOTE — H&P (Signed)
Reason For Visit Seen today for a pre-op H&P.   Active Problems Problems  1. Benign prostatic hyperplasia with urinary obstruction (N40.1)   Assessed By: Jimmey Ralph (Urology); Last Assessed: 10 Feb 2014  History of Present Illness 73 YO male patient of Dr. Lyndal Rainbow seen today for a pre-op visit. Scheduled for Greenlight TUR-P 02/14/14.    GU HX:    #1 urinary retention - developed Apr 2015 - failed several voiding trials. He is on maximum medical therapy with finasteride and Flomax without much benefit. Associated with BPH and a weak bladder.   -Jul 2015 UDS revealed a 400 cc capacity and a hypotonic, almost atonic bladder.       #2 BPH. Chronic. Not greatly improved with Flomax and finasteride. Associated with urinary retention.   -Jul 2015 TRUS prostate = 133.96 mL in volume.    #3 hypotonic bladder - Jul 2015 - urodynamics - weak detrusor muscle. Contractions were 8-12 cm of water which are weak and not well sustained. He also has loss of compliance with this. This is likely worsened due to his obstructing prostate.    #4 - PCa screening -  April 2015 normal DRE  Jul 2015 PSA 3.37 (6.74; PSAD = 0.05)     #5 gross hematuria - Apr 2015 - gross hematuria after Foley placement; cystoscopy revealed clot in the bladder, bladder stones, trilobar hypertrophy, bladder diverticula     PMH: CAD, COPD, CVA. Dr. Jacalyn Lefevre last note,Aug 2015 - HPI: FU CAD; s/p NSTEMI followed by CABG 10/2008 (L-LAD, S-OM1/L PL br, S-RCA), small AAA, carotid stenosis. F/u CTA of abdomen 5/15 showed AAA 3 x 3.1 cm, cirrhosis and splenomegaly suggesting portal HTN. Beta blocker previously held due to Mobitz Type 1.   Since last seen, He has some dyspnea on exertion but no orthopnea or PND. Chronic bilateral pedal edema. No chest pain or syncope.    Oct 2015 Interval HX:   Today denies CP, SOB, congestion, cough, or fever. "I just hope this works!"   Past Medical History Problems  1.  History of hyperlipidemia (Z86.39) 2. History of hypertension (Z86.79) 3. History of myocardial infarction (I25.2)  Surgical History Problems  1. History of Coronary Artery Surgery  Current Meds 1. Albuterol Sulfate HFA 108 MCG/ACT AERS;  Therapy: (Recorded:23Apr2015) to Recorded 2. Aspirin 81 MG Oral Tablet;  Therapy: (Recorded:23Apr2015) to Recorded 3. BuPROPion HCl ER (SR) 150 MG Oral Tablet Extended Release 12 Hour;  Therapy: (Recorded:23Apr2015) to Recorded 4. DiphenhydrAMINE HCl - 25 MG Oral Capsule;  Therapy: (Recorded:23Apr2015) to Recorded 5. Finasteride 5 MG Oral Tablet;  Therapy: (Recorded:23Apr2015) to Recorded 6. Furosemide 40 MG Oral Tablet;  Therapy: (Recorded:23Apr2015) to Recorded 7. Guaifenesin 600 MG TBCR;  Therapy: (Recorded:23Apr2015) to Recorded 8. Losartan Potassium TABS;  Therapy: (Recorded:31Aug2015) to Recorded 9. Nasonex 50 MCG/ACT Nasal Suspension;  Therapy: (Recorded:23Apr2015) to Recorded 10. Nystatin-Triamcinolone 100000-0.1 UNIT/GM-% External Cream; Apply twice daily;   Therapy: 01Sep2015 to (Evaluate:30Dec2015)  Requested for: 01Sep2015; Last   Rx:01Sep2015 Ordered 11. OxyCODONE HCl - 5 MG Oral Tablet;   Therapy: (Recorded:23Apr2015) to Recorded 12. Potassium Chloride Crys ER 20 MEQ Oral Tablet Extended Release;   Therapy: (Recorded:23Apr2015) to Recorded 13. Senna Laxative 25 MG Oral Tablet;   Therapy: (Recorded:23Apr2015) to Recorded 14. Stool Softener TABS;   Therapy: (Recorded:23Apr2015) to Recorded 15. Tamsulosin HCl - 0.4 MG Oral Capsule;   Therapy: (Recorded:23Apr2015) to Recorded  Allergies Medication  1. Hydrocodone-Acetaminophen CAPS  Family History Problems  1. Family history of Death of family  member : Mother, Father   Mother at age26; Unsure cause of deathFather at age 52; Heart attack  Social History Problems  1. Denied: History of Alcohol use 2. 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 Former smoker [phone])   2  ppd for 50 years and quit in 2010 4. Number of children   1 son 5. Retired 70. Widowed  Review of Systems Genitourinary, constitutional, skin, eye, otolaryngeal, hematologic/lymphatic, cardiovascular, pulmonary, endocrine, musculoskeletal, gastrointestinal, neurological and psychiatric system(s) were reviewed and pertinent findings if present are noted.    Vitals Vital Signs [Data Includes: Last 1 Day]  Recorded: 23Oct2015 03:06PM  Weight: 205 lb  BMI Calculated: 28.59 BSA Calculated: 2.13 Blood Pressure: 160 / 75 Temperature: 97.5 F Heart Rate: 64  Physical Exam Constitutional: Well nourished and well developed . No acute distress. The patient appears well hydrated.  ENT:. The ears and nose are normal in appearance.  Neck: The appearance of the neck is normal.  Pulmonary: No respiratory distress.  Cardiovascular: Heart rate and rhythm are normal. Bilateral LE +2 pitting edema  Abdomen: The abdomen is mildly obese. The abdomen is soft and nontender. No suprapubic tenderness. No CVA tenderness.  Genitourinary: Examination of the penis demonstrates an indwelling catheter.  Lymphatics: The posterior cervical nodes are not enlarged or tender.  Skin: Normal skin turgor and normal skin color and pigmentation.  Neuro/Psych:. Mood and affect are appropriate.    Assessment Assessed  1. Preoperative examination (Z01.818) 2. Benign prostatic hyperplasia with urinary obstruction (N40.1)  Plan Cleared to proceed with GreenLight TUR-P with Dr. Laverna Peace Electronically signed by : Jimmey Ralph, ANP-C; Feb 10 2014  3:15PM EST

## 2014-02-14 NOTE — Interval H&P Note (Signed)
History and Physical Interval Note:  02/14/2014 7:35 AM  [name]  has presented today for surgery, with the diagnosis of BENIGN PROSTATIC HYPERPLASIA/URINARY RETENTION  The various methods of treatment have been discussed with the patient and family. After consideration of risks, benefits and other options for treatment, the patient has consented to  Procedure(s): GREEN LIGHT LASER TURP (TRANSURETHRAL RESECTION OF PROSTATE (N/A) as a surgical intervention .  The patient's history has been reviewed, patient examined, no change in status, stable for surgery.  I have reviewed the patient's chart and labs.  Questions were answered to the patient's satisfaction.  Pt without CP or increased SOB today. He has constipation from the recent pain meds. I reviewed Dr. Stanford Breed and Dr. Donneta Romberg notes, pt labs, echo and CT imaging. Discussed likely staged procedure and possible conversion to TURP. I added Levaquin to Unasyn for pre-op coverage based on office culture.     Festus Aloe

## 2014-02-14 NOTE — Addendum Note (Signed)
Addendum created 02/14/14 1131 by Sharlette Dense, CRNA   Modules edited: Anesthesia LDA

## 2014-02-14 NOTE — Transfer of Care (Signed)
Immediate Anesthesia Transfer of Care Note  Patient: [name]  Procedure(s) Performed: Procedure(s): GREEN LIGHT LASER TURP (TRANSURETHRAL RESECTION OF PROSTATE (N/A)  Patient Location: PACU  Anesthesia Type:General  Level of Consciousness: awake  Airway & Oxygen Therapy: Patient Spontanous Breathing and Patient connected to face mask oxygen  Post-op Assessment: Report given to PACU RN and Post -op Vital signs reviewed and stable  Post vital signs: Reviewed and stable  Complications: No apparent anesthesia complications

## 2014-02-14 NOTE — Discharge Instructions (Signed)
Foley Catheter Care A Foley catheter is a soft, flexible tube. This tube is placed into your bladder to drain pee (urine). If you go home with this catheter in place, follow the instructions below. TAKING CARE OF THE CATHETER 1. Wash your hands with soap and water. 2. Put soap and water on a clean washcloth.  Clean the skin where the tube goes into your body.  Clean away from the tube site.  Never wipe toward the tube.  Clean the area using a circular motion.  Remove all the soap. Pat the area dry with a clean towel. For males, reposition the skin that covers the end of the penis (foreskin). 3. Attach the tube to your leg with tape or a leg strap. Do not stretch the tube tight. If you are using tape, remove any stickiness left behind by past tape you used. 4. Keep the drainage bag below your hips. Keep it off the floor. 5. Check your tube during the day. Make sure it is working and draining. Make sure the tube does not curl, twist, or bend. 6. Do not pull on the tube or try to take it out. TAKING CARE OF THE DRAINAGE BAGS You will have a large overnight drainage bag and a small leg bag. You may wear the overnight bag any time. Never wear the small bag at night. Follow the directions below. Emptying the Drainage Bag Empty your drainage bag when it is  - full or at least 2-3 times a day. 1. Wash your hands with soap and water. 2. Keep the drainage bag below your hips. 3. Hold the dirty bag over the toilet or clean container. 4. Open the pour spout at the bottom of the bag. Empty the pee into the toilet or container. Do not let the pour spout touch anything. 5. Clean the pour spout with a gauze pad or cotton ball that has rubbing alcohol on it. 6. Close the pour spout. 7. Attach the bag to your leg with tape or a leg strap. 8. Wash your hands well. Changing the Drainage Bag Change your bag once a month or sooner if it starts to smell or look dirty.  1. Wash your hands with soap and  water. 2. Pinch the rubber tube so that pee does not spill out. 3. Disconnect the catheter tube from the drainage tube at the connection valve. Do not let the tubes touch anything. 4. Clean the end of the catheter tube with an alcohol wipe. Clean the end of a the drainage tube with a different alcohol wipe. 5. Connect the catheter tube to the drainage tube of the clean drainage bag. 6. Attach the new bag to the leg with tape or a leg strap. Avoid attaching the new bag too tightly. 7. Wash your hands well. Cleaning the Drainage Bag 1. Wash your hands with soap and water. 2. Wash the bag in warm, soapy water. 3. Rinse the bag with warm water. 4. Fill the bag with a mixture of white vinegar and water (1 cup vinegar to 1 quart warm water [.2 liter vinegar to 1 liter warm water]). Close the bag and soak it for 30 minutes in the solution. 5. Rinse the bag with warm water. 6. Hang the bag to dry with the pour spout open and hanging downward. 7. Store the clean bag (once it is dry) in a clean plastic bag. 8. Wash your hands well. PREVENT INFECTION  Wash your hands before and after touching your tube.  Take showers every day. Wash the skin where the tube enters your body. Do not take baths. Replace wet leg straps with dry ones, if this applies.  Do not use powders, sprays, or lotions on the genital area. Only use creams, lotions, or ointments as told by your doctor.  For females, wipe from front to back after going to the bathroom.  Drink enough fluids to keep your pee clear or pale yellow unless you are told not to have too much fluid (fluid restriction).  Do not let the drainage bag or tubing touch or lie on the floor.  Wear cotton underwear to keep the area dry. GET HELP IF:  Your pee is cloudy or smells unusually bad.  Your tube becomes clogged.  You are not draining pee into the bag or your bladder feels full.  Your tube starts to leak. GET HELP RIGHT AWAY IF:  You have pain,  puffiness (swelling), redness, or yellowish-white fluid (pus) where the tube enters the body.  You have pain in the belly (abdomen), legs, lower back, or bladder.  You have a fever.  You see blood fill the tube, or your pee is pink or red.  You feel sick to your stomach (nauseous), throw up (vomit), or have chills.  Your tube gets pulled out. MAKE SURE YOU:   Understand these instructions.  Will watch your condition.  Will get help right away if you are not doing well or get worse. Document Released: 08/02/2012 Document Revised: 08/22/2013 Document Reviewed: 08/02/2012 [facility] Patient Information 2015 [place], [place]. This information is not intended to replace advice given to you by your health care provider. Make sure you discuss any questions you have with your health care provider.   Prostate Laser Surgery, Care After Refer to this sheet in the next few weeks. These instructions provide you with information on caring for yourself after your procedure. Your health care provider may also give you more specific instructions. Your treatment has been planned according to current medical practices, but problems sometimes occur. Call your health care provider if you have any problems or questions after your procedure. WHAT TO EXPECT AFTER THE PROCEDURE  You may notice blood in your urine that could last up to 3 weeks.  After your catheter is removed, you will have burning (especially at the tip of your penis) when you urinate, especially at the end of urination. For the first few weeks after your procedure, you will feel the need to urinate often. HOME CARE INSTRUCTIONS   Do not perform vigorous exercise, especially heavy lifting, for 1 week or as directed by your health care provider.  Avoid sexual activity for 4-6 weeks or as directed by your health care provider.  Do not ride in a car for extended periods for 1 month or as directed by your health care provider.  Do not strain to  have a bowel movement. Drink a lot of fluids and and make sure you get enough fiber in your diet.  Drink enough fluids to keep your urine clear or pale yellow. SEEK IMMEDIATE MEDICAL CARE IF:   Your catheter has been removed and you are suddenly unable to urinate.  Your catheter has not been removed, and it develops a blockage.  You start to have blood clots in your urine.  The blood in your urine becomes persistent or gets thick.  Your temperature is greater than 100.32F (38.1C).  You develop chest pains.  You develop shortness of breath.  You develop leg  swelling or pain. MAKE SURE YOU:  Understand these instructions.  Will watch your condition.  Will get help right away if you are not doing well or get worse. Document Released: 04/07/2005 Document Revised: 04/12/2013 Document Reviewed: 09/27/2012 [facility] Patient Information 2015 [place], [place]. This information is not intended to replace advice given to you by your health care provider. Make sure you discuss any questions you have with your health care provider.

## 2014-02-14 NOTE — Anesthesia Postprocedure Evaluation (Signed)
  Anesthesia Post-op Note  Patient: [name]  Procedure(s) Performed: Procedure(s) (LRB): GREEN LIGHT LASER TURP (TRANSURETHRAL RESECTION OF PROSTATE (N/A)  Patient Location: PACU  Anesthesia Type: General  Level of Consciousness: awake and alert   Airway and Oxygen Therapy: Patient Spontanous Breathing  Post-op Pain: mild  Post-op Assessment: Post-op Vital signs reviewed, Patient's Cardiovascular Status Stable, Respiratory Function Stable, Patent Airway and No signs of Nausea or vomiting  Last Vitals:  Filed Vitals:   02/14/14 1015  BP: 179/52  Pulse: 54  Temp:   Resp: 14    Post-op Vital Signs: stable   Complications: No apparent anesthesia complications

## 2014-02-21 ENCOUNTER — Encounter: Payer: Self-pay | Admitting: Nurse Practitioner

## 2014-02-21 ENCOUNTER — Telehealth: Payer: Self-pay | Admitting: Internal Medicine

## 2014-02-21 ENCOUNTER — Ambulatory Visit (INDEPENDENT_AMBULATORY_CARE_PROVIDER_SITE_OTHER): Payer: Medicare Other | Admitting: Nurse Practitioner

## 2014-02-21 VITALS — BP 180/76 | HR 63 | Temp 97.8°F | Ht 72.0 in | Wt 211.0 lb

## 2014-02-21 DIAGNOSIS — I10 Essential (primary) hypertension: Secondary | ICD-10-CM

## 2014-02-21 DIAGNOSIS — I2510 Atherosclerotic heart disease of native coronary artery without angina pectoris: Secondary | ICD-10-CM | POA: Diagnosis not present

## 2014-02-21 NOTE — Progress Notes (Signed)
Pre visit review using our clinic review tool, if applicable. No additional management support is needed unless otherwise documented below in the visit note. 

## 2014-02-21 NOTE — Assessment & Plan Note (Addendum)
TURP 1 week ago, urinary cath pulled at urology ofc today-BP was high in ofc. Pt has skipped lasix last few days. Last dose losartan was last night. Resume lasix daily until f/u appt. Continue losartan. F/u 1 week.

## 2014-02-21 NOTE — Progress Notes (Signed)
Subjective:    Patient here for evaluation of elevated blood pressure while at urologists ofc today. Mr Augello had TURP procedure last week. He f/u w/urologist today to have catheter pulled. BP was 200/100. Pt states 2 readings were taken.  He has been well controlled on losartan 50 mg qd for several mos. His cardiologist d/c'd amlodipine few mos ago due to LE swelling. He also takes lasix, but he has meiised several doses this week since his surgery. His last dose losartan was yesterday. He c/o mild bilat frontal HA that started today. Pt states he was told not to take tylenol due to poor liver function.  The following portions of the patient's history were reviewed and updated as appropriate: allergies, current medications, past family history, past medical history, past social history, past surgical history and problem list.  Review of Systems Pertinent items are noted in HPI.     Objective:    BP 180/76 mmHg  Pulse 63  Temp(Src) 97.8 F (36.6 C) (Temporal)  Ht 6' (1.829 m)  Wt 211 lb (95.709 kg)  BMI 28.61 kg/m2  SpO2 98% General appearance: alert, cooperative, appears stated age, mild distress and OOB coming into office. Head: Normocephalic, without obvious abnormality, atraumatic Eyes: negative findings: lids and lashes normal and conjunctivae and sclerae normal Lungs: clear to auscultation bilaterally Heart: RRR, systolic murmur Extremities: edema +2 pitting ankles to mid-shin, bilat and venous stasis dermatitis noted Pulses: 2+ and symmetric Skin: dusky LE, c/w venous stasis    Assessment:   1. Essential hypertension Likely due to not taking lasix regularly this week. Resume daily lasix & losartan. Avoid NSAIDS for pain as they can contribute to elevated BP. Use oxycodone if BP meds & rest do not relieve HA. F/u 1 wk

## 2014-02-21 NOTE — Patient Instructions (Addendum)
Take losartan & 1 Tab lasix when you get home.  Take taking both medications daily until you see Dr. Jarrett Soho at follow up appointment.  Call urologist if trouble urinating.  Avoid ibuprophen for HA as it can make blood pressure higher.   If above meds & rest doe not relieve HA, take oxycodone.

## 2014-02-21 NOTE — Telephone Encounter (Signed)
Patient Information:  Caller Name: Jguadalupe  Phone: [phone]  Patient: [name], [name]  Gender: Male  DOB: 1941-03-21  Age: 73 Years  PCP: Gwendolyn Grant (Adults only)  Office Follow Up:  Does the office need to follow up with this patient?: No  Instructions For The Office: N/A  RN Note:  Patient states he was seen at the Urology office 02/21/14. Patient states his blood pressure was elevated to 480 and 165 systolic. Patient is uncertain of Diastolic reading. Patient states he was changed from Amlodipine to Losartan 12/13/13, due to ankle swelling. Patient denies headache or dizziness. Patient denies numbness or tingling. Patient denied chest pain or dyspnea. Patient states he has mild swelling of left ankle. RN reviewed [facility] Record. Care advice given per guidelines. Call back parameters reviewed. Patient verbalizes understanding. No appts. available at [facility] office. Patient agrees to be seen at the [facility] office. Patient states he will not be able to arrive at [facility] office until after 1500, due to transportation/travel time. Patient will call his Son for transportation.  Appt. scheduled for 1530 02/21/14 with Nicky Pugh NP.  Patient advised to call 911 if he develops numbness tingling, weakness, paralysis, chest pain. Patient verbalizes understanding and agreeable.   Symptoms  Reason For Call & Symptoms: Elevated Blood pressure  Reviewed Health History In EMR: Yes  Reviewed Medications In EMR: Yes  Reviewed Allergies In EMR: Yes  Reviewed Surgeries / Procedures: Yes  Date of Onset of Symptoms: 02/21/2014  Guideline(s) Used:  High Blood Pressure  Disposition Per Guideline:   See Today in Office  Reason For Disposition Reached:   BP > 180/110  Advice Given:  Call Back If:  Headache, blurred vision, difficulty talking, or difficulty walking occurs  Chest pain or difficulty breathing occurs  You want to go in to the office for a blood pressure  check  You become worse.  Patient Will Follow Care Advice:  YES  Appointment Scheduled:  02/21/2014 15:30:00 Appointment Scheduled Provider:  Other

## 2014-02-22 ENCOUNTER — Telehealth: Payer: Self-pay | Admitting: Internal Medicine

## 2014-02-22 NOTE — Telephone Encounter (Signed)
emmi emailed °

## 2014-02-24 ENCOUNTER — Encounter: Payer: Self-pay | Admitting: Internal Medicine

## 2014-02-24 ENCOUNTER — Ambulatory Visit (INDEPENDENT_AMBULATORY_CARE_PROVIDER_SITE_OTHER): Payer: Medicare Other | Admitting: Internal Medicine

## 2014-02-24 VITALS — BP 140/74 | HR 74 | Temp 97.8°F | Resp 18 | Ht 72.0 in | Wt 208.1 lb

## 2014-02-24 DIAGNOSIS — N320 Bladder-neck obstruction: Secondary | ICD-10-CM

## 2014-02-24 DIAGNOSIS — I2510 Atherosclerotic heart disease of native coronary artery without angina pectoris: Secondary | ICD-10-CM

## 2014-02-24 DIAGNOSIS — G8929 Other chronic pain: Secondary | ICD-10-CM

## 2014-02-24 DIAGNOSIS — I10 Essential (primary) hypertension: Secondary | ICD-10-CM | POA: Diagnosis not present

## 2014-02-24 DIAGNOSIS — M549 Dorsalgia, unspecified: Secondary | ICD-10-CM

## 2014-02-24 MED ORDER — SORBITOL 70 % PO SOLN: 15.0000 mL | Freq: Every day | ORAL | Status: DC | PRN

## 2014-02-24 MED ORDER — POLYETHYLENE GLYCOL 3350 17 G PO PACK: 17.0000 g | PACK | Freq: Every day | ORAL | Status: DC

## 2014-02-24 NOTE — Progress Notes (Signed)
   Subjective:    Patient ID: [name], male    DOB: [birth year], 73 y.o.   MRN: [id number]  HPI HPI The patient is a 73 YO man who is here for an acute visit for BP recheck. He had a TURP done last week and when they were removing his Foley noted his blood pressure was elevated. He had skipped several days of medication. He was seen by Dr. And advised to resume his home medications. He has done so. He denies chest pain, headache, abdominal pain. He has successfully gotten rid of his Foley catheter and is able to urinate although he does not have great control at this time. He has been told by could take several months. He is still constipated and was unable to get the medication prescribed at last visit due to insurance. He was given Miralax which has helped some he is also taking Colace and Senokot over-the-counter. He is still passing gas and has several small bowel movements a week.  Review of Systems  Constitutional: Negative for fever, activity change, appetite change, fatigue and unexpected weight change.  HENT: Negative.   Eyes: Negative.   Respiratory: Negative for cough, chest tightness, shortness of breath and wheezing.   Cardiovascular: Negative for chest pain, palpitations and leg swelling.  Gastrointestinal: Positive for constipation. Negative for nausea, vomiting, abdominal pain, diarrhea and abdominal distention.  Genitourinary: Negative for difficulty urinating.  Musculoskeletal: Positive for back pain. Negative for gait problem and myalgias.  Skin: Negative.   Neurological: Negative for dizziness, weakness, light-headedness, numbness and headaches.      Objective:   Physical Exam  Constitutional: He is oriented to person, place, and time. He appears well-developed and well-nourished.  Overweight  HENT:  Head: Normocephalic and atraumatic.  Eyes: EOM are normal.  Neck: Normal range of motion.  Cardiovascular: Normal rate and regular rhythm.   CABG scar    Pulmonary/Chest: Effort normal and breath sounds normal. No respiratory distress. He has no wheezes. He has no rales.  Abdominal: Soft. Bowel sounds are normal. He exhibits no distension. There is no tenderness. There is no rebound.  Musculoskeletal:  Minimal tenderness in the lumbar region  Neurological: He is alert and oriented to person, place, and time. Coordination normal.  Skin: Skin is warm and dry.   Filed Vitals:   02/24/14 0840 02/24/14 0906  BP: 152/82 140/74  Pulse: 74   Temp: 97.8 F (36.6 C)   TempSrc: Oral   Resp: 18   Height: 6' (1.829 m)   Weight: 208 lb 1.9 oz (94.403 kg)   SpO2: 97%          Review of Systems     Objective:   Physical Exam    Assessment & Plan:

## 2014-02-24 NOTE — Progress Notes (Signed)
Pre visit review using our clinic review tool, if applicable. No additional management support is needed unless otherwise documented below in the visit note. 

## 2014-02-24 NOTE — Assessment & Plan Note (Signed)
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 We'll add sorbitol, continue neuroleptics, continue Colace. Had wanted to try Movantic however his insurance did not wish to pay for this

## 2014-02-24 NOTE — Assessment & Plan Note (Signed)
Status post TURP and doing well without Foley.

## 2014-02-24 NOTE — Patient Instructions (Signed)
We will send a medicine called sorbitol that you can use for your constipation. Take 15 mL of daily as well as one packet of the Mira lax. It is also okay to continue taking the stool softener and your other laxative.  We will keep watching your blood pressure and we can adjust things as needed in the future.  Comment back as previously scheduled.

## 2014-02-24 NOTE — Assessment & Plan Note (Signed)
Doing well on Lasix and losartan. Repeat bP checked normal today.

## 2014-02-27 NOTE — Telephone Encounter (Signed)
Pt called in and wanted to see if Dr Doug Sou will call in generic for Murelax.  He cant afford to buy murelax over the counter

## 2014-02-27 NOTE — Telephone Encounter (Signed)
Please advise 

## 2014-02-28 ENCOUNTER — Telehealth: Payer: Self-pay | Admitting: *Deleted

## 2014-02-28 MED ORDER — POLYETHYLENE GLYCOL 3350 17 GM/SCOOP PO POWD: 17.0000 g | Freq: Two times a day (BID) | ORAL | Status: DC | PRN

## 2014-02-28 NOTE — Telephone Encounter (Signed)
Left msg on triage stating the last 2 laxative md rx the insurance denied coverage. His urologist gave him a 10 day supply on generic miralax. Requesting refill to be sent to pharmacy. Called pt back inform [facility].../[facility]

## 2014-02-28 NOTE — Telephone Encounter (Signed)
Patient will go pick up prescription at the pharmacy.

## 2014-02-28 NOTE — Telephone Encounter (Signed)
Sent in an additional prescription for generic Miralax.

## 2014-03-10 ENCOUNTER — Telehealth: Payer: Self-pay | Admitting: Internal Medicine

## 2014-03-10 MED ORDER — OXYCODONE HCL 5 MG PO CAPS: 5.0000 mg | ORAL_CAPSULE | Freq: Four times a day (QID) | ORAL | Status: DC | PRN

## 2014-03-10 NOTE — Telephone Encounter (Signed)
Pt called was told rx ready for pick up. Place in cabinet by ami...[name]

## 2014-03-10 NOTE — Telephone Encounter (Signed)
Done please call patient that it is ready.

## 2014-03-10 NOTE — Telephone Encounter (Signed)
Requesting refill of Oxycodone, monthly refill.

## 2014-03-15 ENCOUNTER — Telehealth: Payer: Self-pay | Admitting: Cardiology

## 2014-03-15 NOTE — Telephone Encounter (Signed)
Close encounter 

## 2014-03-23 NOTE — Progress Notes (Signed)
HPI: FU CAD; s/p NSTEMI followed by CABG 10/2008 (L-LAD, S-OM1/L PL br, S-RCA), small AAA, carotid stenosis. Beta blocker previously held due to Mobitz Type 1. Patient had photo vaporization of the prostate in October. Since last seen, He has some dyspnea on exertion which is chronic. No orthopnea or PND. No chest pain. No syncope. Pedal edema has resolved. Studies:  - LHC (09/2008): 3 v CAD, EF 65% => CABG  - Echo (8/15): Normal LV function, mild aortic stenosis with a mean gradient of 9 mmHg and mild left atrial enlargement.  - Carotid US (10/9022): RICA 0-97%; LICA 35-32% - f/u 2 years  - Abdominal CTA July 2015 showed a 3 cm x 3.1 cm infrarenal abdominal aortic aneurysm. 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  Current Outpatient Prescriptions  Medication Sig Dispense Refill  . albuterol (PROVENTIL HFA;VENTOLIN HFA) 108 (90 BASE) MCG/ACT inhaler Inhale 2 puffs into the lungs every 6 (six) hours as needed for wheezing or shortness of breath.     [name] aspirin 81 MG tablet Take 1 tablet (81 mg total) by mouth once. EVERY EVENING 30 tablet   . atorvastatin (LIPITOR) 40 MG tablet Take 40 mg by mouth. EVERY EVENING    . buPROPion (WELLBUTRIN XL) 150 MG 24 hr tablet Take 150 mg by mouth. EVERY EVENING    . docusate sodium (STOOL SOFTENER) 100 MG capsule Take 100 mg by mouth. EVERY EVENING    . finasteride (PROSCAR) 5 MG tablet Take 5 mg by mouth. EVERY EVENING    . fluticasone (FLONASE) 50 MCG/ACT nasal spray Place 1 spray into both nostrils. EVERY EVENING    . furosemide (LASIX) 40 MG tablet Take 40 mg by mouth. EVERY EVENING    . guaiFENesin (MUCINEX) 600 MG 12 hr tablet Take 1,200 mg by mouth 2 (two) times daily.      [name] losartan (COZAAR) 50 MG tablet Take 50 mg by mouth. EVERY EVENING    . Naloxegol Oxalate (MOVANTIK) 25 MG TABS Take 25 mg by mouth daily. 30 tablet 3  . nystatin ointment (MYCOSTATIN) Apply 1 application topically 2 (two) times daily.      [name] oxycodone (OXY-IR) 5 MG capsule Take 1-2 capsules (5-10 mg total) by mouth every 6 (six) hours as needed for pain. 180 capsule 0  . polyethylene glycol powder (GLYCOLAX/MIRALAX) powder Take 17 g by mouth 2 (two) times daily as needed. 850 g 1  . potassium chloride SA (K-DUR,KLOR-CON) 20 MEQ tablet Take 40 mEq by mouth. EVERY EVENING    . Sennosides 25 MG TABS Take 25 mg by mouth. EVERY EVENING    . sorbitol 70 % solution Take 15 mLs by mouth daily as needed. 3840 mL 6  . tamsulosin (FLOMAX) 0.4 MG CAPS capsule Take 1 capsule (0.4 mg total) by mouth daily after supper. 90 capsule 3  . triamcinolone cream (KENALOG) 0.1 % Apply 1 application topically 2 (two) times daily.     No current facility-administered medications for this visit.     Past Medical History  Diagnosis Date  . 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 COPD (chronic obstructive pulmonary disease)   . Cerebrovascular disease   . AAA (abdominal aortic aneurysm)   . 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 Hyperlipidemia   . HTN (hypertension)   . CONGENITAL UNSPEC REDUCTION DEFORMITY LOWER LIMB   . TOBACCO USE, QUIT   . CHF (  congestive heart failure)   . 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 Cirrhosis     on CT a/p 07/2013, no longer drinking  . 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 AAA (abdominal aortic aneurysm)   . Carotid stenosis   . Shortness of breath     EXERTION  . Neuromuscular disorder     PT STATES HE HAS BEEN TOLD HE EITHER HAD POLIO OR CEREBRAL PALSY - STATES HIS LEFT LEG IS SHORTER AND AFFECTS HIS BALANCE - HE WEARS ELEVATED SHOE -LEFT  . Foley catheter in place   . Blindness of left eye   . Pain     BACK, LEGS AND ARMS  . Constipation due to opioid therapy 2014    began about a year ago    Past Surgical History  Procedure Laterality Date  . Coronary artery bypass graft  11/03/2008    Ricard Dillon - x4: left  internal mammary artery to the distal left anterior descending, saphemous vein graft to the first circumflex marginal branch with a Y graft sequentiallly to a left posterolateral branch, saphenous vein graft to the distal right coronary artery  . Green light laser turp (transurethral resection of prostate N/A 02/14/2014    Procedure: GREEN LIGHT LASER TURP (TRANSURETHRAL RESECTION OF PROSTATE;  Surgeon: Festus Aloe, MD;  Location: [facility];  Service: Urology;  Laterality: N/A;    History   Social History  . Marital Status: Widowed    Spouse Name: N/A    Number of Children: N/A  . Years of Education: N/A   Occupational History  . Not on file.   Social History Main Topics  . Smoking status: Former Research scientist (life sciences)  . Smokeless tobacco: Never Used  . Alcohol Use: No     Comment: History of heavy alcohol use per pt. Quit many years ago1/1/ 2005  . Drug Use: No  . Sexual Activity: Not on file   Other Topics Concern  . Not on file   Social History Narrative   lives alone here in [place].  He has 1 son, who lives in the [place], and he has     an uncle, who lives nearby as well.     ROS: no fevers or chills, productive cough, hemoptysis, dysphasia, odynophagia, melena, hematochezia, dysuria, hematuria, rash, seizure activity, orthopnea, PND, pedal edema, claudication. Remaining systems are negative.  Physical Exam: Well-developed well-nourished in no acute distress.  Skin is warm and dry.  HEENT is normal.  Neck is supple.  Chest Diminished breath sounds Cardiovascular exam is regular, 2/6 systolic murmur left sternal border. Abdominal exam nontender or distended. No masses palpated. Extremities show trace edema. neuro grossly intact  ECG Sinus rhythm with first-degree AV block. Nonspecific ST changes.

## 2014-03-24 ENCOUNTER — Encounter: Payer: Self-pay | Admitting: Cardiology

## 2014-03-24 ENCOUNTER — Ambulatory Visit (INDEPENDENT_AMBULATORY_CARE_PROVIDER_SITE_OTHER): Payer: Medicare Other | Admitting: Cardiology

## 2014-03-24 VITALS — BP 152/70 | HR 89 | Ht 72.0 in | Wt 213.3 lb

## 2014-03-24 DIAGNOSIS — I441 Atrioventricular block, second degree: Secondary | ICD-10-CM | POA: Diagnosis not present

## 2014-03-24 DIAGNOSIS — I2510 Atherosclerotic heart disease of native coronary artery without angina pectoris: Secondary | ICD-10-CM | POA: Diagnosis not present

## 2014-03-24 DIAGNOSIS — I350 Nonrheumatic aortic (valve) stenosis: Secondary | ICD-10-CM

## 2014-03-24 DIAGNOSIS — E785 Hyperlipidemia, unspecified: Secondary | ICD-10-CM

## 2014-03-24 DIAGNOSIS — I714 Abdominal aortic aneurysm, without rupture, unspecified: Secondary | ICD-10-CM

## 2014-03-24 DIAGNOSIS — I25810 Atherosclerosis of coronary artery bypass graft(s) without angina pectoris: Secondary | ICD-10-CM | POA: Diagnosis not present

## 2014-03-24 MED ORDER — LOSARTAN POTASSIUM 100 MG PO TABS: 100.0000 mg | ORAL_TABLET | Freq: Every day | ORAL | Status: DC

## 2014-03-24 NOTE — Assessment & Plan Note (Signed)
Blood pressure elevated. Increase Cozaar to 100 mg daily. Check potassium and renal function in 1 week. Note edema has improved off of Norvasc and on diuretic.

## 2014-03-24 NOTE — Patient Instructions (Addendum)
Your physician wants you to follow-up in: 6 MONTHS WITH DR CRENSHAW You will receive a reminder letter in the mail two months in advance. If you don't receive a letter, please call our office to schedule the follow-up appointment.   INCREASE LOSARTAN TO 100 MG ONCE DAILY= 2 OF THE 50 MG TABLETS ONCE DAILY  Your physician recommends that you return for lab work in: ONE WEEK 

## 2014-03-24 NOTE — Assessment & Plan Note (Signed)
Plan follow-up echocardiogram in the future. 

## 2014-03-24 NOTE — Assessment & Plan Note (Signed)
Continue aspirin and statin. 

## 2014-03-24 NOTE — Assessment & Plan Note (Signed)
Previously documented.Avoid AV nodal blocking agents.

## 2014-03-24 NOTE — Assessment & Plan Note (Signed)
Follow-up carotid Dopplers June 2016. 

## 2014-03-24 NOTE — Assessment & Plan Note (Signed)
Continue statin. 

## 2014-03-24 NOTE — Assessment & Plan Note (Signed)
Follow-up CTA July 2016.

## 2014-03-24 NOTE — Addendum Note (Signed)
Addended by: Cristopher Estimable on: 03/24/2014 03:42 PM   Modules accepted: Orders

## 2014-03-31 ENCOUNTER — Other Ambulatory Visit (INDEPENDENT_AMBULATORY_CARE_PROVIDER_SITE_OTHER): Payer: Medicare Other

## 2014-03-31 DIAGNOSIS — I350 Nonrheumatic aortic (valve) stenosis: Secondary | ICD-10-CM

## 2014-03-31 DIAGNOSIS — I25810 Atherosclerosis of coronary artery bypass graft(s) without angina pectoris: Secondary | ICD-10-CM

## 2014-03-31 DIAGNOSIS — E785 Hyperlipidemia, unspecified: Secondary | ICD-10-CM | POA: Diagnosis not present

## 2014-03-31 DIAGNOSIS — I714 Abdominal aortic aneurysm, without rupture, unspecified: Secondary | ICD-10-CM

## 2014-03-31 DIAGNOSIS — I441 Atrioventricular block, second degree: Secondary | ICD-10-CM | POA: Diagnosis not present

## 2014-03-31 LAB — BASIC METABOLIC PANEL
BUN: 21 mg/dL (ref 6–23)
CO2: 27 mEq/L (ref 19–32)
Calcium: 9.4 mg/dL (ref 8.4–10.5)
Chloride: 104 mEq/L (ref 96–112)
Creatinine, Ser: 1.1 mg/dL (ref 0.4–1.5)
GFR: 73.92 mL/min (ref >60.00)
Glucose, Bld: 107 mg/dL — ABNORMAL HIGH (ref 70–99)
Potassium: 4.5 mEq/L (ref 3.5–5.1)
Sodium: 136 mEq/L (ref 135–145)

## 2014-04-05 ENCOUNTER — Telehealth: Payer: Self-pay | Admitting: Internal Medicine

## 2014-04-05 ENCOUNTER — Other Ambulatory Visit: Payer: Self-pay | Admitting: Geriatric Medicine

## 2014-04-05 NOTE — Telephone Encounter (Signed)
Pt called in, requesting refill on his oxycodone (OXY-IR) 5 MG capsule [[id number]]

## 2014-04-06 ENCOUNTER — Other Ambulatory Visit: Payer: Self-pay | Admitting: Geriatric Medicine

## 2014-04-06 NOTE — Telephone Encounter (Signed)
Please advise. Pt called to check up on this request

## 2014-04-06 NOTE — Telephone Encounter (Signed)
Spoke with patient and informed him he can come pick up his prescription tomorrow for oxycodone.

## 2014-04-07 ENCOUNTER — Other Ambulatory Visit: Payer: Self-pay | Admitting: Geriatric Medicine

## 2014-04-07 MED ORDER — OXYCODONE HCL 5 MG PO CAPS: 5.0000 mg | ORAL_CAPSULE | Freq: Four times a day (QID) | ORAL | Status: DC | PRN

## 2014-04-26 ENCOUNTER — Other Ambulatory Visit: Payer: Self-pay

## 2014-04-26 DIAGNOSIS — N312 Flaccid neuropathic bladder, not elsewhere classified: Secondary | ICD-10-CM | POA: Diagnosis not present

## 2014-04-26 DIAGNOSIS — N5201 Erectile dysfunction due to arterial insufficiency: Secondary | ICD-10-CM | POA: Diagnosis not present

## 2014-04-26 MED ORDER — POTASSIUM CHLORIDE CRYS ER 20 MEQ PO TBCR: 40.0000 meq | EXTENDED_RELEASE_TABLET | Freq: Every day | ORAL | Status: DC

## 2014-04-26 MED ORDER — FUROSEMIDE 40 MG PO TABS: 40.0000 mg | ORAL_TABLET | Freq: Every day | ORAL | Status: DC

## 2014-05-01 ENCOUNTER — Ambulatory Visit (INDEPENDENT_AMBULATORY_CARE_PROVIDER_SITE_OTHER): Payer: Medicare Other | Admitting: Internal Medicine

## 2014-05-01 ENCOUNTER — Encounter: Payer: Self-pay | Admitting: Internal Medicine

## 2014-05-01 VITALS — BP 190/72 | HR 104 | Temp 97.6°F | Resp 20 | Ht 72.0 in | Wt 210.0 lb

## 2014-05-01 DIAGNOSIS — E785 Hyperlipidemia, unspecified: Secondary | ICD-10-CM | POA: Diagnosis not present

## 2014-05-01 MED ORDER — OXYCODONE HCL 5 MG PO CAPS: 5.0000 mg | ORAL_CAPSULE | Freq: Four times a day (QID) | ORAL | Status: DC | PRN

## 2014-05-01 NOTE — Patient Instructions (Signed)
STOP taking the lipitor. We will give you a week off it to see if your pain is better.   If your pain is not changed we will start you back on low dose of the lipitor and gradually build it back up.   If your pain is much better we will change the lipitor and try a different statin medicine without the side effects and gradually build up the levels.   We have refilled your pain medicine today.  Come back in about 1-2 months to check in and see how you are doing with pain.

## 2014-05-01 NOTE — Progress Notes (Signed)
Pre visit review using our clinic review tool, if applicable. No additional management support is needed unless otherwise documented below in the visit note. 

## 2014-05-02 NOTE — Assessment & Plan Note (Signed)
He is having significant myalgias. Given his poor quality of life at this time will stop lipitor for 1 week. If pain gone will trial different statin and start low dosing and titrate slowly. If pain unchanged after 1 week will restart lipitor at low dose and build back to his dosing as able.

## 2014-05-02 NOTE — Progress Notes (Signed)
   Subjective:    Patient ID: [name], male    DOB: 01/08/1941, 74 y.o.   MRN: [id number]  HPI The patient is a 74 YO male who is coming in today for pain in his muscles. He states that his cardiologist told him that it was likely from his lipitor but did not want to stop the medicine. He states that his life is not a life right now since he is in pain all the time and cannot do anything. His pain medicine is helping some but wearing off quickly and he will run out of medicine early given the increased pain. He denies fevers, weight loss, chest pains, neurological changes. He is not depressed or anxious although he does want change.   Review of Systems  Constitutional: Negative.   Respiratory: Negative for cough, chest tightness, shortness of breath and wheezing.   Cardiovascular: Negative for chest pain, palpitations and leg swelling.  Gastrointestinal: Positive for constipation. Negative for abdominal pain, diarrhea and abdominal distention.  Musculoskeletal: Positive for myalgias and arthralgias. Negative for gait problem.  Neurological: Negative.       Objective:   Physical Exam  Constitutional: He is oriented to person, place, and time. He appears well-developed and well-nourished.  HENT:  Head: Normocephalic and atraumatic.  Eyes: EOM are normal.  Neck: Normal range of motion.  Cardiovascular: Normal rate and regular rhythm.   Pulmonary/Chest: Effort normal and breath sounds normal. No respiratory distress. He has no wheezes. He has no rales.  Abdominal: Soft. Bowel sounds are normal. He exhibits no distension. There is no tenderness.  Musculoskeletal: He exhibits tenderness. He exhibits no edema.  Neurological: He is alert and oriented to person, place, and time. Coordination normal.  Skin: Skin is warm and dry.   Filed Vitals:   05/01/14 1556  BP: 190/72  Pulse: 104  Temp: 97.6 F (36.4 C)  TempSrc: Oral  Resp: 20  Height: 6' (1.829 m)  Weight: 210 lb (95.255  kg)  SpO2: 97%      Assessment & Plan:

## 2014-05-08 ENCOUNTER — Telehealth: Payer: Self-pay | Admitting: Internal Medicine

## 2014-05-08 NOTE — Telephone Encounter (Signed)
Pt called in and would like a return phone call back about meds.

## 2014-05-09 NOTE — Telephone Encounter (Signed)
Patient states that after a week of stopping Lipitor, he is still hurting. He wants to know what else we can do. Please advise, thanks.

## 2014-05-09 NOTE — Telephone Encounter (Signed)
Would like to give the lipitor another week to get out of his system.

## 2014-05-09 NOTE — Telephone Encounter (Signed)
Patient aware and will call us back in another week to let us know how he is doing.

## 2014-05-15 ENCOUNTER — Telehealth: Payer: Self-pay | Admitting: Internal Medicine

## 2014-05-15 NOTE — Telephone Encounter (Signed)
Patient called to let Dr. Doug Sou know that he is still having pain without the Lipitor. Would like to know what to do. CB# [phone]

## 2014-05-15 NOTE — Telephone Encounter (Signed)
Please advise, thanks.

## 2014-05-16 MED ORDER — METHOCARBAMOL 500 MG PO TABS: 500.0000 mg | ORAL_TABLET | Freq: Three times a day (TID) | ORAL | Status: DC | PRN

## 2014-05-16 NOTE — Telephone Encounter (Signed)
He can start back to taking the lipitor since it did not help to stop. We can try to add a muscle relaxer to his pain medicine to see if this helps. Will send in robaxin, can take 1 pill up to 3 times a day for the pain.

## 2014-05-16 NOTE — Telephone Encounter (Signed)
Patient called again saying he wanted some answers on what to do.

## 2014-05-18 ENCOUNTER — Telehealth: Payer: Self-pay | Admitting: Internal Medicine

## 2014-05-18 MED ORDER — TIZANIDINE HCL 4 MG PO TABS: 2.0000 mg | ORAL_TABLET | Freq: Three times a day (TID) | ORAL | Status: DC | PRN

## 2014-05-18 NOTE — Telephone Encounter (Signed)
Patient called stating his insurance will not cover methocarbamol. He needs alternative medication. Call Alex @ [facility] to find out name of medication.

## 2014-05-18 NOTE — Telephone Encounter (Signed)
Spoke with Cristie Hem at the pharmacy and he said that tizanidine is covered under the patient's insurance policy. Would you like to switch medications?

## 2014-05-18 NOTE — Telephone Encounter (Signed)
Done, sent in

## 2014-05-19 NOTE — Telephone Encounter (Signed)
Spoke with patient and he will go pick up new medication.

## 2014-05-24 ENCOUNTER — Telehealth: Payer: Self-pay | Admitting: *Deleted

## 2014-05-24 NOTE — Telephone Encounter (Signed)
Left msg on triage stating md rx tizanide and he has done research that med can cause liver damage. Pt is concern about taking medicine he stated has cirrhosis & high blood pressure. Requesting md recommendations...[name]

## 2014-05-25 NOTE — Telephone Encounter (Signed)
Notified pt with md response.../lmb 

## 2014-05-25 NOTE — Telephone Encounter (Signed)
Would recommend that it is okay for short term use and that he should try and if it does not work he can likely get another medication approved.

## 2014-05-31 ENCOUNTER — Telehealth: Payer: Self-pay | Admitting: *Deleted

## 2014-05-31 MED ORDER — OXYCODONE HCL 5 MG PO CAPS: 5.0000 mg | ORAL_CAPSULE | Freq: Four times a day (QID) | ORAL | Status: DC | PRN

## 2014-05-31 NOTE — Telephone Encounter (Signed)
Left msg on triage requesting refill on his oxycodone.../lmb 

## 2014-05-31 NOTE — Telephone Encounter (Signed)
Called patient to let him know his prescription is ready to be picked up.

## 2014-05-31 NOTE — Telephone Encounter (Signed)
Done and signed 

## 2014-06-02 ENCOUNTER — Other Ambulatory Visit: Payer: Self-pay | Admitting: Internal Medicine

## 2014-06-19 ENCOUNTER — Other Ambulatory Visit: Payer: Self-pay | Admitting: Internal Medicine

## 2014-06-23 ENCOUNTER — Other Ambulatory Visit: Payer: Self-pay | Admitting: Geriatric Medicine

## 2014-06-23 MED ORDER — TIZANIDINE HCL 4 MG PO TABS: ORAL_TABLET | ORAL | Status: DC

## 2014-06-27 ENCOUNTER — Telehealth: Payer: Self-pay | Admitting: Internal Medicine

## 2014-06-27 ENCOUNTER — Other Ambulatory Visit: Payer: Self-pay | Admitting: Geriatric Medicine

## 2014-06-27 NOTE — Telephone Encounter (Signed)
Pt called in requesting refill on his oxycodone (OXY-IR) 5 MG capsule [[id number]]

## 2014-06-27 NOTE — Telephone Encounter (Signed)
Spoke with patient. He can come pick up on Thursday, the 10th.

## 2014-06-28 MED ORDER — OXYCODONE HCL 5 MG PO CAPS: 5.0000 mg | ORAL_CAPSULE | Freq: Four times a day (QID) | ORAL | Status: DC | PRN

## 2014-06-28 NOTE — Telephone Encounter (Signed)
Tried calling pt # on file states has been disconnected. Will place rx in cabinet for pick-up...[name]

## 2014-06-28 NOTE — Telephone Encounter (Signed)
Printed and signed.  

## 2014-06-28 NOTE — Telephone Encounter (Deleted)
Notified pt rx ready for pick-up.../lmb 

## 2014-06-30 DIAGNOSIS — N401 Enlarged prostate with lower urinary tract symptoms: Secondary | ICD-10-CM | POA: Diagnosis not present

## 2014-06-30 DIAGNOSIS — N312 Flaccid neuropathic bladder, not elsewhere classified: Secondary | ICD-10-CM | POA: Diagnosis not present

## 2014-06-30 DIAGNOSIS — N138 Other obstructive and reflux uropathy: Secondary | ICD-10-CM | POA: Diagnosis not present

## 2014-06-30 DIAGNOSIS — R972 Elevated prostate specific antigen [PSA]: Secondary | ICD-10-CM | POA: Diagnosis not present

## 2014-06-30 DIAGNOSIS — N5201 Erectile dysfunction due to arterial insufficiency: Secondary | ICD-10-CM | POA: Diagnosis not present

## 2014-07-27 ENCOUNTER — Telehealth: Payer: Self-pay | Admitting: Internal Medicine

## 2014-07-27 MED ORDER — OXYCODONE HCL 5 MG PO CAPS
5.0000 mg | ORAL_CAPSULE | Freq: Four times a day (QID) | ORAL | Status: DC | PRN
Start: 2014-07-27 — End: 2014-08-28

## 2014-07-27 NOTE — Telephone Encounter (Signed)
Called pt no answer LMOM rx ready for pick-up.../lmb 

## 2014-07-27 NOTE — Telephone Encounter (Signed)
Printed and signed.  

## 2014-07-27 NOTE — Telephone Encounter (Signed)
Patient is requesting script for oxycodone

## 2014-08-09 ENCOUNTER — Other Ambulatory Visit: Payer: Self-pay

## 2014-08-09 NOTE — Telephone Encounter (Signed)
Medication Detail      Disp Refills Start End     furosemide (LASIX) 40 MG tablet 90 tablet 2 04/26/2014     Sig - Route: Take 1 tablet (40 mg total) by mouth daily. EVERY EVENING - Oral    E-Prescribing Status: Receipt confirmed by pharmacy (04/26/2014 11:39 AM EST)     Pharmacy    [id number] - [place], [place] [address]

## 2014-08-10 ENCOUNTER — Other Ambulatory Visit: Payer: Self-pay

## 2014-08-10 MED ORDER — FUROSEMIDE 40 MG PO TABS: 40.0000 mg | ORAL_TABLET | Freq: Every day | ORAL | Status: DC

## 2014-08-23 ENCOUNTER — Other Ambulatory Visit: Payer: Self-pay | Admitting: Internal Medicine

## 2014-08-25 ENCOUNTER — Other Ambulatory Visit: Payer: Self-pay | Admitting: Internal Medicine

## 2014-08-26 ENCOUNTER — Other Ambulatory Visit: Payer: Self-pay | Admitting: Internal Medicine

## 2014-08-28 ENCOUNTER — Telehealth: Payer: Self-pay | Admitting: Internal Medicine

## 2014-08-28 MED ORDER — OXYCODONE HCL 5 MG PO CAPS: 5.0000 mg | ORAL_CAPSULE | Freq: Four times a day (QID) | ORAL | Status: DC | PRN

## 2014-08-28 NOTE — Telephone Encounter (Signed)
Printed and signed.  

## 2014-08-28 NOTE — Telephone Encounter (Signed)
Called patient to inform him that he could come pick up his prescription.

## 2014-08-28 NOTE — Telephone Encounter (Signed)
Pt called in needing refill on his oxycodone (OXY-IR) 5 MG capsule [[id number]]

## 2014-09-07 ENCOUNTER — Encounter: Payer: Self-pay | Admitting: Internal Medicine

## 2014-09-07 ENCOUNTER — Ambulatory Visit (INDEPENDENT_AMBULATORY_CARE_PROVIDER_SITE_OTHER): Payer: Medicare Other | Admitting: Internal Medicine

## 2014-09-07 VITALS — BP 180/80 | HR 61 | Temp 97.9°F | Resp 20 | Ht 72.0 in | Wt 214.0 lb

## 2014-09-07 DIAGNOSIS — J441 Chronic obstructive pulmonary disease with (acute) exacerbation: Secondary | ICD-10-CM

## 2014-09-07 DIAGNOSIS — M549 Dorsalgia, unspecified: Secondary | ICD-10-CM | POA: Diagnosis not present

## 2014-09-07 DIAGNOSIS — G8929 Other chronic pain: Secondary | ICD-10-CM

## 2014-09-07 MED ORDER — PREGABALIN 75 MG PO CAPS: 75.0000 mg | ORAL_CAPSULE | Freq: Two times a day (BID) | ORAL | Status: DC

## 2014-09-07 MED ORDER — METHYLPREDNISOLONE ACETATE 40 MG/ML IJ SUSP
40.0000 mg | Freq: Once | INTRAMUSCULAR | Status: AC
  Administered 2014-09-07: 40 mg via INTRAMUSCULAR

## 2014-09-07 MED ORDER — OXYCODONE HCL 10 MG PO TABS: 10.0000 mg | ORAL_TABLET | ORAL | Status: DC | PRN

## 2014-09-07 NOTE — Progress Notes (Signed)
   Subjective:    Patient ID: [name], male    DOB: 04-19-1941, 74 y.o.   MRN: [id number]  HPI The patient is a 74 YO man who is coming in for follow up of his chronic pain. Not well controlled right now. We tried tizanidine since it seemed like he had a lot of muscular component to his pain last time. He is still having pain in his shoulder and his legs. The medicine made him nauseous and did not help. He is still using his oxycodone as prescribed but is not getting relief except for about 2 hours after the pill. He is not able to do much and not able to leave the house much.   He does have a second problem which is that his breathing is worse the last 2-3 days and coughing more. He is coughing up okay some yellowish green sputum. More SOB with walking. Has been using his inhalers more and is still doing his scheduled inhalers.   Review of Systems  Constitutional: Positive for activity change and fatigue. Negative for fever, appetite change and unexpected weight change.  HENT: Negative.   Respiratory: Positive for cough, shortness of breath and wheezing. Negative for chest tightness.   Cardiovascular: Negative.   Gastrointestinal: Negative.   Musculoskeletal: Positive for myalgias, back pain and arthralgias.  Skin: Negative.   Neurological: Negative.       Objective:   Physical Exam  Constitutional: He appears well-developed and well-nourished.  Overweight  HENT:  Head: Normocephalic and atraumatic.  Eyes: EOM are normal.  Neck: Normal range of motion.  Cardiovascular: Normal rate and regular rhythm.   Pulmonary/Chest: Effort normal. No respiratory distress. He has wheezes. He has no rales. He exhibits no tenderness.  Expiratory wheezing with expiratory which is prolonged.   Abdominal: Soft.  Neurological: Coordination normal.  Skin: Skin is warm and dry.   Filed Vitals:   09/07/14 1026  BP: 180/80  Pulse: 61  Temp: 97.9 F (36.6 C)  TempSrc: Oral  Resp: 20    Height: 6' (1.829 m)  Weight: 214 lb (97.07 kg)  SpO2: 98%      Assessment & Plan:

## 2014-09-07 NOTE — Assessment & Plan Note (Signed)
Not well controlled and will increase the strength of his pain medication as his QOL is so poor. Will also add lyrica 75 mg BID as some of his pain could be neuropathic in nature from his back problems to see if this can relieve some of his narcotic burden for pain. UDS checked today as due.

## 2014-09-07 NOTE — Assessment & Plan Note (Signed)
Breathing is worse and more sputum, treat as flare with depo-medrol 40 mg IM during the visit. If no improvement will treat with antibiotics. He is not on any chronic inhaler right now. Uses albuterol prn which is usually not much. He may need chronic controller and will see. If no improvement will start. He has high medication burden right now and does not wish to start another medication if possible.

## 2014-09-07 NOTE — Patient Instructions (Signed)
We will increase the pain medicine strength so it is now twice as strong to see if this works better. The other thing we are going to do is to try another medicine called lyrica. This is a medicine that works on pain and nerves. Take 1 pill twice a day and give it a couple weeks to kick in.   We are giving you a steroid shot to get the lungs doing better.   Come back in about 3 months if things are doing well. If things are not doing well come back sooner.

## 2014-09-07 NOTE — Progress Notes (Signed)
Pre visit review using our clinic review tool, if applicable. No additional management support is needed unless otherwise documented below in the visit note. 

## 2014-09-11 ENCOUNTER — Ambulatory Visit: Payer: Medicare Other | Admitting: Internal Medicine

## 2014-09-11 ENCOUNTER — Other Ambulatory Visit: Payer: Self-pay | Admitting: Internal Medicine

## 2014-09-12 ENCOUNTER — Other Ambulatory Visit: Payer: Self-pay

## 2014-09-12 MED ORDER — ALBUTEROL SULFATE HFA 108 (90 BASE) MCG/ACT IN AERS: 2.0000 | INHALATION_SPRAY | Freq: Four times a day (QID) | RESPIRATORY_TRACT | Status: DC | PRN

## 2014-09-13 ENCOUNTER — Telehealth: Payer: Self-pay | Admitting: Internal Medicine

## 2014-09-13 NOTE — Telephone Encounter (Signed)
Pt called and he said that his ins is need PA on the pregabalin (LYRICA) 75 MG capsule [[id number]], pt just wanted to know if it has been rec'd and or what the status is ?

## 2014-09-14 ENCOUNTER — Telehealth: Payer: Self-pay

## 2014-09-14 ENCOUNTER — Other Ambulatory Visit: Payer: Self-pay | Admitting: Geriatric Medicine

## 2014-09-14 ENCOUNTER — Other Ambulatory Visit: Payer: Self-pay | Admitting: Internal Medicine

## 2014-09-14 MED ORDER — FINASTERIDE 5 MG PO TABS: 5.0000 mg | ORAL_TABLET | Freq: Every day | ORAL | Status: DC

## 2014-09-14 MED ORDER — PREGABALIN 75 MG PO CAPS: 75.0000 mg | ORAL_CAPSULE | Freq: Two times a day (BID) | ORAL | Status: DC

## 2014-09-14 NOTE — Telephone Encounter (Signed)
Refill for Finasteride 5 mg tablet #90 requested.

## 2014-09-14 NOTE — Telephone Encounter (Signed)
Spoke with patient. Waiting to see if PA is needed from pharmacy.

## 2014-09-21 ENCOUNTER — Other Ambulatory Visit: Payer: Self-pay | Admitting: Cardiology

## 2014-09-21 DIAGNOSIS — I6523 Occlusion and stenosis of bilateral carotid arteries: Secondary | ICD-10-CM

## 2014-09-22 NOTE — Telephone Encounter (Signed)
Pt called in about the upate on  PA?

## 2014-09-26 ENCOUNTER — Ambulatory Visit (HOSPITAL_COMMUNITY): Payer: Medicare Other | Attending: Cardiology

## 2014-09-26 DIAGNOSIS — Z951 Presence of aortocoronary bypass graft: Secondary | ICD-10-CM | POA: Insufficient documentation

## 2014-09-26 DIAGNOSIS — I6523 Occlusion and stenosis of bilateral carotid arteries: Secondary | ICD-10-CM | POA: Diagnosis not present

## 2014-09-26 DIAGNOSIS — Z87891 Personal history of nicotine dependence: Secondary | ICD-10-CM | POA: Diagnosis not present

## 2014-09-26 DIAGNOSIS — E785 Hyperlipidemia, unspecified: Secondary | ICD-10-CM | POA: Diagnosis not present

## 2014-09-26 DIAGNOSIS — I10 Essential (primary) hypertension: Secondary | ICD-10-CM | POA: Diagnosis not present

## 2014-09-26 DIAGNOSIS — J449 Chronic obstructive pulmonary disease, unspecified: Secondary | ICD-10-CM | POA: Insufficient documentation

## 2014-09-26 DIAGNOSIS — I2510 Atherosclerotic heart disease of native coronary artery without angina pectoris: Secondary | ICD-10-CM | POA: Insufficient documentation

## 2014-09-29 DIAGNOSIS — R972 Elevated prostate specific antigen [PSA]: Secondary | ICD-10-CM | POA: Diagnosis not present

## 2014-10-05 ENCOUNTER — Telehealth: Payer: Self-pay | Admitting: Internal Medicine

## 2014-10-05 MED ORDER — OXYCODONE HCL 10 MG PO TABS: 10.0000 mg | ORAL_TABLET | ORAL | Status: DC | PRN

## 2014-10-05 NOTE — Telephone Encounter (Signed)
Patient requesting a refill of Oxycodone HCl 10 MG TABS [[id number]]

## 2014-10-05 NOTE — Telephone Encounter (Signed)
Printed and signed.  

## 2014-10-05 NOTE — Telephone Encounter (Signed)
Patient will come pick up.

## 2014-10-07 ENCOUNTER — Telehealth: Payer: Self-pay | Admitting: Physician Assistant

## 2014-10-07 ENCOUNTER — Other Ambulatory Visit: Payer: Self-pay | Admitting: Physician Assistant

## 2014-10-07 NOTE — Telephone Encounter (Signed)
Paged the after hour service requesting medication. Page says he is in a lot of pain. Call his number twice, no answer, left message. Unclear if he is contacting cardiology service to request Oxycodon as cardiology service usually do not prescribed pain medication as outpatient.   Hilbert Corrigan PA Pager: [phone]

## 2014-10-09 ENCOUNTER — Other Ambulatory Visit: Payer: Self-pay | Admitting: Geriatric Medicine

## 2014-10-09 ENCOUNTER — Telehealth: Payer: Self-pay | Admitting: Internal Medicine

## 2014-10-09 MED ORDER — OXYCODONE HCL 10 MG PO TABS: 10.0000 mg | ORAL_TABLET | Freq: Every day | ORAL | Status: DC

## 2014-10-09 MED ORDER — OXYCODONE HCL 10 MG PO TABS
ORAL_TABLET | ORAL | Status: DC
Start: 2014-10-09 — End: 2014-10-30

## 2014-10-09 NOTE — Telephone Encounter (Signed)
Pt called in and again about this, He states that he is in terrible pain?  Can you call him about this ?  He has called 4 times today about this today

## 2014-10-09 NOTE — Telephone Encounter (Signed)
Please note the encounter below. Please call patient asap

## 2014-10-09 NOTE — Telephone Encounter (Signed)
Patient states that there was an issue @ [facility] with oxycodone. He states that his insurance turned it down. He requests that you give him a call.

## 2014-10-09 NOTE — Telephone Encounter (Signed)
Resolved. Dr. Doug Sou gave new rx.

## 2014-10-30 ENCOUNTER — Encounter: Payer: Self-pay | Admitting: Internal Medicine

## 2014-10-30 ENCOUNTER — Ambulatory Visit (INDEPENDENT_AMBULATORY_CARE_PROVIDER_SITE_OTHER): Payer: Medicare Other | Admitting: Internal Medicine

## 2014-10-30 ENCOUNTER — Other Ambulatory Visit (INDEPENDENT_AMBULATORY_CARE_PROVIDER_SITE_OTHER): Payer: Medicare Other

## 2014-10-30 VITALS — BP 170/62 | HR 65 | Resp 16 | Ht 72.0 in | Wt 211.0 lb

## 2014-10-30 DIAGNOSIS — R0600 Dyspnea, unspecified: Secondary | ICD-10-CM | POA: Diagnosis not present

## 2014-10-30 DIAGNOSIS — I350 Nonrheumatic aortic (valve) stenosis: Secondary | ICD-10-CM | POA: Diagnosis not present

## 2014-10-30 DIAGNOSIS — I6523 Occlusion and stenosis of bilateral carotid arteries: Secondary | ICD-10-CM | POA: Diagnosis not present

## 2014-10-30 DIAGNOSIS — J441 Chronic obstructive pulmonary disease with (acute) exacerbation: Secondary | ICD-10-CM | POA: Diagnosis not present

## 2014-10-30 DIAGNOSIS — Z23 Encounter for immunization: Secondary | ICD-10-CM | POA: Diagnosis not present

## 2014-10-30 LAB — COMPREHENSIVE METABOLIC PANEL
ALT: 17 U/L (ref 0–53)
AST: 21 U/L (ref 0–37)
Albumin: 4.1 g/dL (ref 3.5–5.2)
Alkaline Phosphatase: 91 U/L (ref 39–117)
BUN: 24 mg/dL — ABNORMAL HIGH (ref 6–23)
CO2: 25 mEq/L (ref 19–32)
Calcium: 9 mg/dL (ref 8.4–10.5)
Chloride: 105 mEq/L (ref 96–112)
Creatinine, Ser: 1.58 mg/dL — ABNORMAL HIGH (ref 0.40–1.50)
GFR: 46.05 mL/min — ABNORMAL LOW (ref >60.00)
Glucose, Bld: 106 mg/dL — ABNORMAL HIGH (ref 70–99)
Potassium: 4.7 mEq/L (ref 3.5–5.1)
Sodium: 139 mEq/L (ref 135–145)
Total Bilirubin: 1.9 mg/dL — ABNORMAL HIGH (ref 0.2–1.2)
Total Protein: 7.7 g/dL (ref 6.0–8.3)

## 2014-10-30 LAB — CBC
HCT: 36.1 % — ABNORMAL LOW (ref 39.0–52.0)
Hemoglobin: 12.2 g/dL — ABNORMAL LOW (ref 13.0–17.0)
MCHC: 33.8 g/dL (ref 30.0–36.0)
MCV: 87.8 fl (ref 78.0–100.0)
Platelets: 191 10*3/uL (ref 150.0–400.0)
RBC: 4.11 Mil/uL — ABNORMAL LOW (ref 4.22–5.81)
RDW: 14.4 % (ref 11.5–15.5)
WBC: 7.3 10*3/uL (ref 4.0–10.5)

## 2014-10-30 LAB — BRAIN NATRIURETIC PEPTIDE: Pro B Natriuretic peptide (BNP): 128 pg/mL — ABNORMAL HIGH (ref 0.0–100.0)

## 2014-10-30 MED ORDER — OXYCODONE HCL 20 MG PO TABS: 20.0000 mg | ORAL_TABLET | ORAL | Status: DC | PRN

## 2014-10-30 NOTE — Patient Instructions (Signed)
We have given you the pneumonia booster shot today.   We are also checking your blood levels for fluid and may need to increase the lasix based on the results.   If we change the lasix we may have you come back for blood work.   We have given you the changed prescription for the pain medicine so there should not be any problems with it.

## 2014-10-30 NOTE — Progress Notes (Signed)
Pre visit review using our clinic review tool, if applicable. No additional management support is needed unless otherwise documented below in the visit note. 

## 2014-10-31 ENCOUNTER — Telehealth: Payer: Self-pay | Admitting: Internal Medicine

## 2014-10-31 DIAGNOSIS — R0600 Dyspnea, unspecified: Secondary | ICD-10-CM | POA: Insufficient documentation

## 2014-10-31 NOTE — Assessment & Plan Note (Signed)
If dyspnea is continuing despite lack of evidence for fluid overload could get echo sooner rather than later to check on the aortic stenosis.

## 2014-10-31 NOTE — Assessment & Plan Note (Signed)
Lungs sounds clear on exam today. Checking proBNP for signs of extra fluid and increase lasix if needed.

## 2014-10-31 NOTE — Assessment & Plan Note (Signed)
Not in exacerbation today. Continue current outpatient regimen with albuterol prn. If still having dyspnea can consider long acting medication.

## 2014-10-31 NOTE — Telephone Encounter (Signed)
Pt called in and has several questions about his meds and how he is suppose to be taking them.   Best number [phone]

## 2014-10-31 NOTE — Progress Notes (Signed)
   Subjective:    Patient ID: [name], male    DOB: November 26, 1940, 74 y.o.   MRN: [id number]  HPI The patient is a 74 YO man who is coming in for SOB with exertion, not worse with lying flat. Worse with walking. He is taking his inhalers and had steroid injection at last visit. Did notice some improvement after that. No cough or fevers or chills. He has had some problems getting his pain medicine due to the sig and insurance coverage which is causing his some stress. Not exercising much due to the dyspnea which is moderate.   Review of Systems  Constitutional: Positive for activity change and fatigue. Negative for fever, appetite change and unexpected weight change.  HENT: Negative.   Respiratory: Positive for shortness of breath. Negative for chest tightness.   Cardiovascular: Negative for chest pain, palpitations and leg swelling.  Gastrointestinal: Positive for constipation. Negative for abdominal pain and abdominal distention.  Musculoskeletal: Positive for myalgias, back pain and arthralgias.  Skin: Negative.   Neurological: Negative.       Objective:   Physical Exam  Constitutional: He appears well-developed and well-nourished.  Overweight  HENT:  Head: Normocephalic and atraumatic.  Eyes: EOM are normal.  Neck: Normal range of motion.  Cardiovascular: Normal rate and regular rhythm.   Pulmonary/Chest: Effort normal. No respiratory distress. He has no wheezes. He has no rales. He exhibits no tenderness.  Abdominal: Soft.  Neurological: Coordination normal.  Skin: Skin is warm and dry.   Filed Vitals:   10/30/14 1446  BP: 170/62  Pulse: 65  Resp: 16  Height: 6' (1.829 m)  Weight: 211 lb (95.709 kg)  SpO2: 97%      Assessment & Plan:

## 2014-11-01 NOTE — Telephone Encounter (Signed)
Patient states he was told through my chart to double up on lasix for three days.  Patient would like to know if he needs to increase your potassium.  Also would like to know if he needs to take lasix at same time or space out.

## 2014-11-01 NOTE — Telephone Encounter (Signed)
Patient aware of medication instructions.

## 2014-11-01 NOTE — Telephone Encounter (Signed)
Take 2 pills lasix in the morning for 3 days. Does not need to increase potassium. He can space them out if he wants but would advise at once.

## 2014-11-21 NOTE — Progress Notes (Signed)
HPI: FU CAD; s/p NSTEMI followed by CABG 10/2008 (L-LAD, S-OM1/L PL br, S-RCA), small AAA, carotid stenosis. Beta blocker previously held due to Mobitz Type 1. Since last seen, he notes increased dyspnea on exertion for 3 months. No orthopnea, PND, chest pain or syncope. Chronic mild pedal edema. Studies:  - LHC (09/2008): 3 v CAD, EF 65% => CABG  - Echo (8/15): Normal LV function, mild aortic stenosis with a mean gradient of 9 mmHg and mild left atrial enlargement.  - Carotid US (05/7060): RICA 3-76%; LICA 28-31% - f/u 2 years  - Abdominal CTA July 2015 showed a 3 cm x 3.1 cm infrarenal abdominal aortic aneurysm. 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  Current Outpatient Prescriptions  Medication Sig Dispense Refill  . albuterol (PROVENTIL HFA;VENTOLIN HFA) 108 (90 BASE) MCG/ACT inhaler Inhale 2 puffs into the lungs every 6 (six) hours as needed for wheezing or shortness of breath. 18 g 5  . aspirin 81 MG tablet Take 1 tablet (81 mg total) by mouth once. EVERY EVENING 30 tablet   . atorvastatin (LIPITOR) 40 MG tablet Take 1 tablet (40 mg total) by mouth daily at 6 PM. 90 tablet 3  . buPROPion (WELLBUTRIN XL) 150 MG 24 hr tablet TAKE 1 TABLET EVERY DAY 90 tablet 3  . finasteride (PROSCAR) 5 MG tablet Take 1 tablet (5 mg total) by mouth daily. EVERY EVENING 90 tablet 3  . fluticasone (FLONASE) 50 MCG/ACT nasal spray PLACE 1 SPRAY INTO BOTH NOSTRILS EVERY MORNING. 16 g 3  . furosemide (LASIX) 40 MG tablet Take 1 tablet (40 mg total) by mouth daily. EVERY EVENING 90 tablet 2  . guaiFENesin (MUCINEX) 600 MG 12 hr tablet Take 1,200 mg by mouth 2 (two) times daily.      [name] losartan (COZAAR) 100 MG tablet Take 1 tablet (100 mg total) by mouth daily. EVERY EVENING 90 tablet 3  . nystatin ointment (MYCOSTATIN) Apply 1 application topically 2 (two) times daily.    . Oxycodone HCl 20 MG TABS Take 1 tablet (20 mg total) by mouth every 4 (four) hours as needed.  120 each 0  . polyethylene glycol powder (GLYCOLAX/MIRALAX) powder Take 17 g by mouth 2 (two) times daily as needed. 850 g 1  . potassium chloride SA (K-DUR,KLOR-CON) 20 MEQ tablet Take 2 tablets (40 mEq total) by mouth daily. EVERY EVENING 180 tablet 2  . tamsulosin (FLOMAX) 0.4 MG CAPS capsule TAKE 1 CAPSULE (0.4 MG TOTAL) BY MOUTH DAILY AFTER SUPPER. 90 capsule 3  . triamcinolone cream (KENALOG) 0.1 % Apply 1 application topically 2 (two) times daily.    . VENTOLIN HFA 108 (90 BASE) MCG/ACT inhaler INHALE 2 PUFFS INTO THE LUNGS EVERY 6 HOURS AS NEEDED FOR WHEEZING. 3 Inhaler 2   No current facility-administered medications for this visit.     Past Medical History  Diagnosis Date  . 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 COPD (chronic obstructive pulmonary disease)   . Cerebrovascular disease   . AAA (abdominal aortic aneurysm)   . 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 Hyperlipidemia   . HTN (hypertension)   . CONGENITAL UNSPEC REDUCTION DEFORMITY LOWER LIMB   . TOBACCO USE, QUIT   . CHF (congestive heart failure)   . 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 Cirrhosis     on CT a/p 07/2013, no longer drinking  .  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 AAA (abdominal aortic aneurysm)   . Carotid stenosis   . Shortness of breath     EXERTION  . Neuromuscular disorder     PT STATES HE HAS BEEN TOLD HE EITHER HAD POLIO OR CEREBRAL PALSY - STATES HIS LEFT LEG IS SHORTER AND AFFECTS HIS BALANCE - HE WEARS ELEVATED SHOE -LEFT  . Foley catheter in place   . Blindness of left eye   . Pain     BACK, LEGS AND ARMS  . Constipation due to opioid therapy 2014    began about a year ago    Past Surgical History  Procedure Laterality Date  . Coronary artery bypass graft  11/03/2008    Ricard Dillon - x4: left internal mammary artery to the distal left anterior descending, saphemous vein graft to the first  circumflex marginal branch with a Y graft sequentiallly to a left posterolateral branch, saphenous vein graft to the distal right coronary artery  . Green light laser turp (transurethral resection of prostate N/A 02/14/2014    Procedure: GREEN LIGHT LASER TURP (TRANSURETHRAL RESECTION OF PROSTATE;  Surgeon: Festus Aloe, MD;  Location: [facility];  Service: Urology;  Laterality: N/A;    History   Social History  . Marital Status: Widowed    Spouse Name: N/A  . Number of Children: N/A  . Years of Education: N/A   Occupational History  . Not on file.   Social History Main Topics  . Smoking status: Former Research scientist (life sciences)  . Smokeless tobacco: Never Used  . Alcohol Use: No     Comment: History of heavy alcohol use per pt. Quit many years ago1/1/ 2005  . Drug Use: No  . Sexual Activity: Not on file   Other Topics Concern  . Not on file   Social History Narrative   lives alone here in [place].  He has 1 son, who lives in the [place], and he has     an uncle, who lives nearby as well.     ROS: no fevers or chills, productive cough, hemoptysis, dysphasia, odynophagia, melena, hematochezia, dysuria, hematuria, rash, seizure activity, orthopnea, PND, pedal edema, claudication. Remaining systems are negative.  Physical Exam: Well-developed well-nourished in no acute distress.  Skin is warm and dry.  HEENT is normal.  Neck is supple.  Chest is clear to auscultation with normal expansion.  Cardiovascular exam is irregular, 2/6 systolic murmur Abdominal exam nontender or distended. No masses palpated. Extremities show trace edema. neuro grossly intact  ECG atrial flutter at a rate of 70. Inferior lateral T-wave inversion

## 2014-11-24 ENCOUNTER — Ambulatory Visit (INDEPENDENT_AMBULATORY_CARE_PROVIDER_SITE_OTHER): Payer: Medicare Other | Admitting: Cardiology

## 2014-11-24 ENCOUNTER — Other Ambulatory Visit: Payer: Self-pay

## 2014-11-24 ENCOUNTER — Encounter: Payer: Self-pay | Admitting: Cardiology

## 2014-11-24 DIAGNOSIS — I4892 Unspecified atrial flutter: Secondary | ICD-10-CM

## 2014-11-24 DIAGNOSIS — I359 Nonrheumatic aortic valve disorder, unspecified: Secondary | ICD-10-CM

## 2014-11-24 DIAGNOSIS — I714 Abdominal aortic aneurysm, without rupture, unspecified: Secondary | ICD-10-CM

## 2014-11-24 DIAGNOSIS — I6523 Occlusion and stenosis of bilateral carotid arteries: Secondary | ICD-10-CM

## 2014-11-24 DIAGNOSIS — I482 Chronic atrial fibrillation, unspecified: Secondary | ICD-10-CM | POA: Insufficient documentation

## 2014-11-24 MED ORDER — AMLODIPINE BESYLATE 5 MG PO TABS: 5.0000 mg | ORAL_TABLET | Freq: Every day | ORAL | Status: DC

## 2014-11-24 MED ORDER — APIXABAN 5 MG PO TABS: 5.0000 mg | ORAL_TABLET | Freq: Two times a day (BID) | ORAL | Status: DC

## 2014-11-24 MED ORDER — ATORVASTATIN CALCIUM 40 MG PO TABS: 40.0000 mg | ORAL_TABLET | Freq: Every day | ORAL | Status: DC

## 2014-11-24 NOTE — Patient Instructions (Signed)
Your physician recommends that you schedule a follow-up appointment in: 3 MONTHS WITH DR CRENSHAW  STOP ASPIRIN  START [place]  Your physician has requested that you have an echocardiogram. Echocardiography is a painless test that uses sound waves to create images of your heart. It provides your doctor with information about the size and shape of your heart and how well your heart's chambers and valves are working. This procedure takes approximately one hour. There are no restrictions for this procedure.   Your physician has requested that you have an abdominal aorta duplex. During this test, an ultrasound is used to evaluate the aorta. Allow 30 minutes for this exam. Do not eat after midnight the day before and avoid carbonated beverages   Your physician recommends that you return for lab work in: [place]  START AMLODIPINE 5 MG ONCE DAILY

## 2014-11-24 NOTE — Assessment & Plan Note (Signed)
Continue statin. 

## 2014-11-24 NOTE — Assessment & Plan Note (Signed)
Blood pressure elevated. Add amlodipine 5 mg daily and follow.

## 2014-11-24 NOTE — Assessment & Plan Note (Signed)
Repeat echocardiogram. 

## 2014-11-24 NOTE — Assessment & Plan Note (Signed)
Continue statin. Follow-up carotid Dopplers June 2018.

## 2014-11-24 NOTE — Assessment & Plan Note (Signed)
Schedule followup abdominal ultrasound. 

## 2014-11-24 NOTE — Assessment & Plan Note (Signed)
Patient is in newly diagnosed typical atrial flutter. Embolic risk factors include age greater than 23, coronary artery disease, hypertension end diastolic congestive heart failure. CHADSvasc 4. Discontinue aspirin. Begin apixaban 5 BID. Check hemoglobin and renal function in 4 weeks. Repeat echocardiogram. Check TSH. I will refer to electrophysiology for consideration of ablation. He does note increased dyspnea which certainly could be from his atrial flutter. This would also help avoid long-term anticoagulation. If not a candidate for ablation we will proceed with cardioversion after 4 weeks of anticoagulation.

## 2014-11-27 ENCOUNTER — Telehealth: Payer: Self-pay | Admitting: *Deleted

## 2014-11-27 MED ORDER — OXYCODONE HCL 20 MG PO TABS: 20.0000 mg | ORAL_TABLET | ORAL | Status: DC | PRN

## 2014-11-27 NOTE — Telephone Encounter (Signed)
Notified pt rx ready for pick-up. Place in cabinet.../lmb 

## 2014-11-27 NOTE — Telephone Encounter (Signed)
done

## 2014-11-27 NOTE — Telephone Encounter (Signed)
Left msg on triage requesting refill on pain med oxycodone. Md out of office pls advise...[name]

## 2014-12-01 ENCOUNTER — Telehealth: Payer: Self-pay | Admitting: Cardiology

## 2014-12-01 NOTE — Telephone Encounter (Signed)
Returned call to patient Dr.Crenshaw's nurse out of office today will send message to her.

## 2014-12-01 NOTE — Telephone Encounter (Signed)
New message      Pt saw Dr Stanford Breed last Friday.  He said the doctor was going to consult with an EP doctor and get back with him.  He has not heard from our office.  Calling to check the status on this

## 2014-12-04 NOTE — Telephone Encounter (Signed)
Spoke with pt, aware have sent a message to [place], scheduler for the EP department. Pt to call back if he does not hear from them.

## 2014-12-05 ENCOUNTER — Telehealth: Payer: Self-pay | Admitting: Cardiology

## 2014-12-05 NOTE — Telephone Encounter (Signed)
New message     Returning [name]'s call-------something about a referral to an EP doctor

## 2014-12-05 NOTE — Telephone Encounter (Signed)
Patient had questions about EP appointment,  Why so ling of a wait.  Is that critical.  Explained to patient that typically they wait for 4 weeks after placed on blood thiiner to see the EP doctor.  He was thankful for the explanation

## 2014-12-08 ENCOUNTER — Telehealth: Payer: Self-pay | Admitting: Cardiology

## 2014-12-08 ENCOUNTER — Encounter: Payer: Self-pay | Admitting: Internal Medicine

## 2014-12-08 ENCOUNTER — Ambulatory Visit (INDEPENDENT_AMBULATORY_CARE_PROVIDER_SITE_OTHER): Payer: Medicare Other | Admitting: Internal Medicine

## 2014-12-08 VITALS — BP 152/72 | HR 76 | Temp 97.7°F | Resp 20 | Wt 212.0 lb

## 2014-12-08 DIAGNOSIS — I10 Essential (primary) hypertension: Secondary | ICD-10-CM

## 2014-12-08 DIAGNOSIS — I6523 Occlusion and stenosis of bilateral carotid arteries: Secondary | ICD-10-CM

## 2014-12-08 DIAGNOSIS — I483 Typical atrial flutter: Secondary | ICD-10-CM

## 2014-12-08 NOTE — Telephone Encounter (Signed)
Pt wants to know if somebody needs to come with him when he have his procedure on Wednesday?

## 2014-12-08 NOTE — Progress Notes (Signed)
Pre visit review using our clinic review tool, if applicable. No additional management support is needed unless otherwise documented below in the visit note. 

## 2014-12-08 NOTE — Telephone Encounter (Signed)
Left message for pt, he does not need anyone with him

## 2014-12-08 NOTE — Patient Instructions (Signed)
We will not check any blood work today.   Good luck with the ablation and the cardiologist, I will be following things in the computer.   Please call us with any problems or questions that you have before the next visit.   We will see you back in about 4-5 months to check on you.

## 2014-12-09 NOTE — Assessment & Plan Note (Signed)
Seeing cardiology and will get ablation. He was unclear about what happens in an ablation so explained to him. He is taking eliquis for anticoagulation. Not in flutter on exam today.

## 2014-12-09 NOTE — Progress Notes (Signed)
   Subjective:    Patient ID: [name], male    DOB: Sep 06, 1940, 74 y.o.   MRN: [id number]  HPI The patient is a 74 YO man who is coming in for follow up of his atrial flutter which has been newly diagnosed since last visit. He is going to have ablation likely and is on anticoagulation in preparation. He has acute episode of dyspnea with rapid heart beat. Since adjustment of his medicines his breathing is much improved. No cough or SOB with exertion. Still limited energy. Less rescue inhaler usage. No new problems since that time. No rapid heart rate or palpitations.   Review of Systems  Constitutional: Positive for activity change and fatigue. Negative for fever, appetite change and unexpected weight change.  HENT: Negative.   Respiratory: Positive for shortness of breath. Negative for chest tightness.        Improved  Cardiovascular: Negative for chest pain, palpitations and leg swelling.  Gastrointestinal: Negative for abdominal pain, constipation and abdominal distention.  Musculoskeletal: Positive for myalgias, back pain and arthralgias.  Skin: Negative.   Neurological: Negative.       Objective:   Physical Exam  Constitutional: He appears well-developed and well-nourished.  Overweight  HENT:  Head: Normocephalic and atraumatic.  Eyes: EOM are normal.  Neck: Normal range of motion.  Cardiovascular: Normal rate and regular rhythm.   Pulmonary/Chest: Effort normal. No respiratory distress. He has no wheezes. He has no rales. He exhibits no tenderness.  Abdominal: Soft.  Neurological: Coordination normal.  Skin: Skin is warm and dry.    Filed Vitals:   12/08/14 1527 12/08/14 1603  BP: 170/70 152/72  Pulse: 76   Temp: 97.7 F (36.5 C)   TempSrc: Oral   Resp: 20   Weight: 212 lb (96.163 kg)   SpO2: 96%       Assessment & Plan:

## 2014-12-09 NOTE — Assessment & Plan Note (Signed)
Currently back on amlodipine 5 mg daily and losartan and lasix. Swelling is controlled and no reaction with the amlodipine. His BP is still slightly elevated from his goal. Will watch since amlodipine just started a couple weeks ago. If still elevated could increase the amlodipine or the losartan. Recent BMP reviewed and none needed today. No symptoms from the elevation.

## 2014-12-12 ENCOUNTER — Telehealth: Payer: Self-pay | Admitting: Cardiology

## 2014-12-12 NOTE — Telephone Encounter (Signed)
Patient called back to see if any response from Dr. Stanford Breed. I notified patient that Dr. Stanford Breed responded to earlier comment from office at 5:29 pm. Patient notified that Dr. Stanford Breed wants him to continue his medications as prescribed and to call back if symptoms worsened. He verbalized understanding.   SIMMONS, BRITTAINY

## 2014-12-12 NOTE — Telephone Encounter (Signed)
New message    Pt c/o medication issue:  1. Name of Medication: Eliquis  2. How are you currently taking this medication (dosage and times per day)? 5mg  2 times a day   3. Are you having a reaction (difficulty breathing--STAT)? No  4. What is your medication issue?Slight dizziness and slight headache  Please call to discuss

## 2014-12-12 NOTE — Telephone Encounter (Signed)
Continue present meds and notify us if symptoms worsen; fu with EP eval. Kirk Ruths

## 2014-12-12 NOTE — Telephone Encounter (Signed)
Spoke with pt, last night he noticed a little dizziness that continues today. He has a dull feeling in his forehead and slight dizziness. The dizziness occurs while sitting and does not change after standing. He can not reproduce with turning his head or lying down. He has no way of checking his bp but it was elevated when he saw his PCP this week. He has no other symptoms, SOB or chest pain. He has been on eliquis and amlodipine for 18 days now and dizziness and nausea is listed on the web as a potential side effect of eliquis. He would like to make dr Stanford Breed aware of his symptoms. Will forward for dr Stanford Breed review

## 2014-12-13 ENCOUNTER — Ambulatory Visit (HOSPITAL_COMMUNITY)
Admission: RE | Admit: 2014-12-13 | Discharge: 2014-12-13 | Disposition: A | Discharge status: Home / Self Care | Payer: Medicare Other | Source: Ambulatory Visit | Attending: Cardiology | Admitting: Cardiology

## 2014-12-13 DIAGNOSIS — F17200 Nicotine dependence, unspecified, uncomplicated: Secondary | ICD-10-CM | POA: Insufficient documentation

## 2014-12-13 DIAGNOSIS — I10 Essential (primary) hypertension: Secondary | ICD-10-CM | POA: Insufficient documentation

## 2014-12-13 DIAGNOSIS — I714 Abdominal aortic aneurysm, without rupture, unspecified: Secondary | ICD-10-CM

## 2014-12-13 DIAGNOSIS — E785 Hyperlipidemia, unspecified: Secondary | ICD-10-CM | POA: Diagnosis not present

## 2014-12-13 DIAGNOSIS — I2510 Atherosclerotic heart disease of native coronary artery without angina pectoris: Secondary | ICD-10-CM | POA: Insufficient documentation

## 2014-12-15 ENCOUNTER — Telehealth: Payer: Self-pay | Admitting: Cardiology

## 2014-12-15 NOTE — Telephone Encounter (Signed)
New message      Pt want ultrasound results

## 2014-12-15 NOTE — Telephone Encounter (Signed)
Returned call to patient w/ stable AAA duplex results & recommendation for repeat imaging in 1 yr. Pt voiced understanding.

## 2014-12-19 ENCOUNTER — Ambulatory Visit (HOSPITAL_COMMUNITY): Payer: Medicare Other | Attending: Cardiology

## 2014-12-19 ENCOUNTER — Other Ambulatory Visit: Payer: Self-pay

## 2014-12-19 DIAGNOSIS — I359 Nonrheumatic aortic valve disorder, unspecified: Secondary | ICD-10-CM

## 2014-12-19 DIAGNOSIS — R0600 Dyspnea, unspecified: Secondary | ICD-10-CM | POA: Diagnosis not present

## 2014-12-19 DIAGNOSIS — I517 Cardiomegaly: Secondary | ICD-10-CM | POA: Insufficient documentation

## 2014-12-19 DIAGNOSIS — I071 Rheumatic tricuspid insufficiency: Secondary | ICD-10-CM | POA: Insufficient documentation

## 2014-12-19 DIAGNOSIS — I10 Essential (primary) hypertension: Secondary | ICD-10-CM | POA: Diagnosis not present

## 2014-12-19 DIAGNOSIS — I350 Nonrheumatic aortic (valve) stenosis: Secondary | ICD-10-CM | POA: Diagnosis not present

## 2014-12-19 DIAGNOSIS — I371 Nonrheumatic pulmonary valve insufficiency: Secondary | ICD-10-CM | POA: Insufficient documentation

## 2014-12-19 DIAGNOSIS — I4892 Unspecified atrial flutter: Secondary | ICD-10-CM | POA: Diagnosis not present

## 2014-12-19 DIAGNOSIS — E785 Hyperlipidemia, unspecified: Secondary | ICD-10-CM | POA: Insufficient documentation

## 2014-12-19 DIAGNOSIS — Z87891 Personal history of nicotine dependence: Secondary | ICD-10-CM | POA: Insufficient documentation

## 2014-12-20 ENCOUNTER — Telehealth: Payer: Self-pay | Admitting: Internal Medicine

## 2014-12-20 NOTE — Telephone Encounter (Signed)
Pt called in an needs refill on his oxycodone

## 2014-12-20 NOTE — Telephone Encounter (Signed)
This is not due for another week and can be picked up next week.

## 2014-12-20 NOTE — Telephone Encounter (Signed)
Called patient and explained that he cannot refill until next week.

## 2014-12-20 NOTE — Telephone Encounter (Signed)
Patient says he takes one every 4 hours and only got 120 pills. He said he should have gotten 180 pills. Please advise, thanks.

## 2014-12-21 MED ORDER — OXYCODONE HCL 20 MG PO TABS: 20.0000 mg | ORAL_TABLET | ORAL | Status: DC | PRN

## 2014-12-21 NOTE — Telephone Encounter (Signed)
Patient aware and will come pick up 

## 2014-12-21 NOTE — Telephone Encounter (Signed)
Printed and signed for #[id number].

## 2014-12-22 ENCOUNTER — Other Ambulatory Visit (INDEPENDENT_AMBULATORY_CARE_PROVIDER_SITE_OTHER): Payer: Medicare Other

## 2014-12-22 DIAGNOSIS — E785 Hyperlipidemia, unspecified: Secondary | ICD-10-CM

## 2014-12-22 DIAGNOSIS — I10 Essential (primary) hypertension: Secondary | ICD-10-CM | POA: Diagnosis not present

## 2014-12-22 DIAGNOSIS — I4892 Unspecified atrial flutter: Secondary | ICD-10-CM

## 2014-12-22 LAB — BASIC METABOLIC PANEL WITH GFR
BUN: 30 mg/dL — ABNORMAL HIGH (ref 6–23)
CO2: 23 meq/L (ref 19–32)
Calcium: 9.4 mg/dL (ref 8.4–10.5)
Chloride: 108 meq/L (ref 96–112)
Creatinine, Ser: 1.63 mg/dL — ABNORMAL HIGH (ref 0.40–1.50)
GFR: 44.41 mL/min — ABNORMAL LOW
Glucose, Bld: 123 mg/dL — ABNORMAL HIGH (ref 70–99)
Potassium: 4.6 meq/L (ref 3.5–5.1)
Sodium: 141 meq/L (ref 135–145)

## 2014-12-22 LAB — CBC
HCT: 35.4 % — ABNORMAL LOW (ref 39.0–52.0)
Hemoglobin: 11.8 g/dL — ABNORMAL LOW (ref 13.0–17.0)
MCHC: 33.2 g/dL (ref 30.0–36.0)
MCV: 88.3 fl (ref 78.0–100.0)
Platelets: 189 K/uL (ref 150.0–400.0)
RBC: 4.01 Mil/uL — ABNORMAL LOW (ref 4.22–5.81)
RDW: 14 % (ref 11.5–15.5)
WBC: 5.4 K/uL (ref 4.0–10.5)

## 2014-12-22 LAB — TSH: TSH: 3.48 u[IU]/mL (ref 0.35–4.50)

## 2014-12-29 ENCOUNTER — Encounter: Payer: Self-pay | Admitting: Internal Medicine

## 2014-12-29 ENCOUNTER — Ambulatory Visit (INDEPENDENT_AMBULATORY_CARE_PROVIDER_SITE_OTHER): Payer: Medicare Other | Admitting: Internal Medicine

## 2014-12-29 VITALS — BP 142/52 | HR 69 | Temp 98.1°F | Resp 18 | Ht 72.0 in | Wt 209.8 lb

## 2014-12-29 DIAGNOSIS — I10 Essential (primary) hypertension: Secondary | ICD-10-CM | POA: Diagnosis not present

## 2014-12-29 DIAGNOSIS — Z23 Encounter for immunization: Secondary | ICD-10-CM

## 2014-12-29 DIAGNOSIS — I6523 Occlusion and stenosis of bilateral carotid arteries: Secondary | ICD-10-CM

## 2014-12-29 NOTE — Patient Instructions (Signed)
We will have you stop the amlodipine. I will make recommendations to the doctor on Monday so if the pressure is still high he will know what we want to start.   We have given you the flu shot today.

## 2014-12-29 NOTE — Progress Notes (Signed)
Pre visit review using our clinic review tool, if applicable. No additional management support is needed unless otherwise documented below in the visit note. 

## 2014-12-29 NOTE — Telephone Encounter (Signed)
Patient scheduled 01/01/15

## 2014-12-30 NOTE — Assessment & Plan Note (Signed)
Taking lasix and losartan and amlodipine and concern that this is too much. Today on recheck diastolic BP into the 58I with HR in the low 50s. His home records have HR into the high 30s. Unclear how accurate that is but feeling symptomatic during that time. Will stop amlodipine and have him see EP on Monday.

## 2014-12-30 NOTE — Progress Notes (Signed)
   Subjective:    Patient ID: [name], male    DOB: Apr 07, 1941, 74 y.o.   MRN: [id number]  HPI The patient is a 74 YO man coming in for dizziness and unsteadiness. It started about the time he was put back on amlodipine for blood pressure control. Has not had BP monitor until 1-2 days ago. Found BP was okay but HR was 39 and 38. During both episodes he was feeling dizzy. Has not fallen or passed out. Had taken amlodipine in the past without problem.   Review of Systems  Constitutional: Positive for activity change and fatigue. Negative for fever, appetite change and unexpected weight change.  HENT: Negative.   Respiratory: Negative for chest tightness and shortness of breath.   Cardiovascular: Negative for chest pain, palpitations and leg swelling.  Gastrointestinal: Negative for abdominal pain, constipation and abdominal distention.  Musculoskeletal: Positive for myalgias, back pain and arthralgias.  Skin: Negative.   Neurological: Positive for dizziness, weakness and light-headedness. Negative for syncope.      Objective:   Physical Exam  Constitutional: He appears well-developed and well-nourished.  Overweight  HENT:  Head: Normocephalic and atraumatic.  Eyes: EOM are normal.  Neck: Normal range of motion.  Cardiovascular: Normal rate and regular rhythm.   HR lower end of normal  Pulmonary/Chest: Effort normal. No respiratory distress. He has no wheezes. He has no rales. He exhibits no tenderness.  Abdominal: Soft.  Neurological: Coordination normal.  Skin: Skin is warm and dry.   Filed Vitals:   12/29/14 1331 12/29/14 1356 12/29/14 1357  BP: 172/62 132/52 142/52  Pulse: 63 51 69  Temp: 98.1 F (36.7 C)    TempSrc: Oral    Resp: 18    Height: 6' (1.829 m)    Weight: 209 lb 12.8 oz (95.165 kg)    SpO2: 97%        Assessment & Plan:  High dose flu shot given today

## 2015-01-01 ENCOUNTER — Encounter: Payer: Self-pay | Admitting: Internal Medicine

## 2015-01-01 ENCOUNTER — Ambulatory Visit (INDEPENDENT_AMBULATORY_CARE_PROVIDER_SITE_OTHER): Payer: Medicare Other | Admitting: Internal Medicine

## 2015-01-01 VITALS — BP 186/62 | HR 53 | Ht 72.0 in | Wt 206.0 lb

## 2015-01-01 DIAGNOSIS — I350 Nonrheumatic aortic (valve) stenosis: Secondary | ICD-10-CM | POA: Diagnosis not present

## 2015-01-01 DIAGNOSIS — I4892 Unspecified atrial flutter: Secondary | ICD-10-CM

## 2015-01-01 DIAGNOSIS — I483 Typical atrial flutter: Secondary | ICD-10-CM | POA: Diagnosis not present

## 2015-01-01 DIAGNOSIS — I6523 Occlusion and stenosis of bilateral carotid arteries: Secondary | ICD-10-CM

## 2015-01-01 MED ORDER — HYDRALAZINE HCL 25 MG PO TABS: ORAL_TABLET | ORAL | Status: DC

## 2015-01-01 NOTE — Patient Instructions (Signed)
Medication Instructions: - Start Hydralazine 25 mg one tablet by mouth twice daily  Labwork: - Will call to schedule pre-procedure labs when date of procedure is known.  Procedures/Testing: - Your physician has recommended that you have an ablation. Catheter ablation is a medical procedure used to treat some cardiac arrhythmias (irregular heartbeats). During catheter ablation, a long, thin, flexible tube is put into a blood vessel in your groin (upper thigh), or neck. This tube is called an ablation catheter. It is then guided to your heart through the blood vessel. Radio frequency waves destroy small areas of heart tissue where abnormal heartbeats may cause an arrhythmia to start. Please see the instruction sheet given to you today.  Follow-Up: - pending ablation  Any Additional Special Instructions Will Be Listed Below (If Applicable).

## 2015-01-01 NOTE — Progress Notes (Signed)
ELECTROPHYSIOLOGY CONSULT NOTE  Patient ID: [name], MRN: [id number], DOB/AGE: Feb 27, 1941 74 y.o. Admit date: (Not on file) Date of Consult: 01/01/2015  Primary Physician: Olga Millers, MD Primary Cardiologist: [facility]  Chief Complaint: Aflutter    HPI [name] is a 74 y.o. male  Seen for treatment options of atrial flutter.  This has been unassociated with palpitations. However, when it was identified 8/16 by Dr. London Sheer, his note outlined a three-month history of increasing exercise intolerance   He has a history of coronary artery disease is status post CABG 2010. He had a non-STEMI at that time. Most recent assessment of LV function echo 8/16 demonstrated normal LV function with mild aortic stenosis left atrial dimension was 43/1.9/31  Last catheterization 6/10 prior to his bypass     Past Medical History  Diagnosis Date  . 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 COPD (chronic obstructive pulmonary disease)   . Cerebrovascular disease   . AAA (abdominal aortic aneurysm)   . 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 Hyperlipidemia   . HTN (hypertension)   . CONGENITAL UNSPEC REDUCTION DEFORMITY LOWER LIMB   . TOBACCO USE, QUIT   . CHF (congestive heart failure)   . 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 Cirrhosis     on CT a/p 07/2013, no longer drinking  . 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 AAA (abdominal aortic aneurysm)   . Carotid stenosis   . Shortness of breath     EXERTION  . Neuromuscular disorder     PT STATES HE HAS BEEN TOLD HE EITHER HAD POLIO OR CEREBRAL PALSY - STATES HIS LEFT LEG IS SHORTER AND AFFECTS HIS BALANCE - HE WEARS ELEVATED SHOE -LEFT  . Foley catheter in place   . Blindness of left eye   . Pain     BACK, LEGS AND ARMS  . Constipation due to opioid therapy 2014    began about a year ago      Surgical History:  Past Surgical History    Procedure Laterality Date  . Coronary artery bypass graft  11/03/2008    Ricard Dillon - x4: left internal mammary artery to the distal left anterior descending, saphemous vein graft to the first circumflex marginal branch with a Y graft sequentiallly to a left posterolateral branch, saphenous vein graft to the distal right coronary artery  . Green light laser turp (transurethral resection of prostate N/A 02/14/2014    Procedure: GREEN LIGHT LASER TURP (TRANSURETHRAL RESECTION OF PROSTATE;  Surgeon: Festus Aloe, MD;  Location: [facility];  Service: Urology;  Laterality: N/A;     Home Meds: Prior to Admission medications   Medication Sig Start Date End Date Taking? Authorizing Provider  albuterol (PROVENTIL HFA;VENTOLIN HFA) 108 (90 BASE) MCG/ACT inhaler Inhale 2 puffs into the lungs every 6 (six) hours as needed for wheezing or shortness of breath. 09/12/14  Yes Rowe Clack, MD  apixaban (ELIQUIS) 5 MG TABS tablet Take 1 tablet (5 mg total) by mouth 2 (two) times daily. 11/24/14  Yes Lelon Perla, MD  atorvastatin (LIPITOR) 40 MG tablet Take 1 tablet (40 mg total) by mouth daily at 6 PM. 11/24/14  Yes Olga Millers, MD  buPROPion (WELLBUTRIN XL) 150 MG 24 hr tablet TAKE 1 TABLET EVERY DAY 06/02/14  Yes Olga Millers, MD  finasteride (PROSCAR) 5 MG tablet Take  1 tablet (5 mg total) by mouth daily. EVERY EVENING 09/14/14  Yes Olga Millers, MD  fluticasone [facility]) 50 MCG/ACT nasal spray PLACE 1 SPRAY INTO BOTH NOSTRILS EVERY MORNING. 08/23/14  Yes Olga Millers, MD  furosemide (LASIX) 40 MG tablet Take 1 tablet (40 mg total) by mouth daily. EVERY EVENING 08/10/14  Yes Lelon Perla, MD  guaiFENesin (MUCINEX) 600 MG 12 hr tablet Take 1,200 mg by mouth 2 (two) times daily.     Yes Historical Provider, MD  losartan (COZAAR) 100 MG tablet Take 1 tablet (100 mg total) by mouth daily. EVERY EVENING 03/24/14  Yes Lelon Perla, MD  nystatin ointment (MYCOSTATIN) Apply 1 application  topically 2 (two) times daily.   Yes Historical Provider, MD  Oxycodone HCl 20 MG TABS Take 1-2 tablets (20-40 mg total) by mouth every 4 (four) hours as needed. 12/21/14  Yes Olga Millers, MD  polyethylene glycol powder (GLYCOLAX/MIRALAX) powder Take 17 g by mouth 2 (two) times daily as needed. 02/28/14  Yes Olga Millers, MD  potassium chloride SA (K-DUR,KLOR-CON) 20 MEQ tablet Take 2 tablets (40 mEq total) by mouth daily. EVERY EVENING 04/26/14  Yes Lelon Perla, MD  tamsulosin (FLOMAX) 0.4 MG CAPS capsule TAKE 1 CAPSULE (0.4 MG TOTAL) BY MOUTH DAILY AFTER SUPPER. 08/25/14  Yes Olga Millers, MD  triamcinolone cream (KENALOG) 0.1 % Apply 1 application topically 2 (two) times daily.   Yes Historical Provider, MD  VENTOLIN HFA 108 (90 BASE) MCG/ACT inhaler INHALE 2 PUFFS INTO THE LUNGS EVERY 6 HOURS AS NEEDED FOR WHEEZING. 09/12/14  Yes Olga Millers, MD  amLODipine (NORVASC) 5 MG tablet Take 1 tablet (5 mg total) by mouth daily. Patient not taking: Reported on 01/01/2015 11/24/14   Lelon Perla, MD      Allergies:  Allergies  Allergen Reactions  . Other     STATES HE HAS NOT EATEN ANY FISH INCLUDING SHELLFISH FOR PAST 40 YRS - IT CAUSED NAUSEA  . Tylenol [Acetaminophen]     Liver problems  . Hydrocodone Itching and Rash    Social History   Social History  . Marital Status: Widowed    Spouse Name: N/A  . Number of Children: N/A  . Years of Education: N/A   Occupational History  . Not on file.   Social History Main Topics  . Smoking status: Former Research scientist (life sciences)  . Smokeless tobacco: Never Used  . Alcohol Use: No     Comment: History of heavy alcohol use per pt. Quit many years ago1/1/ 2005  . Drug Use: No  . Sexual Activity: Not on file   Other Topics Concern  . Not on file   Social History Narrative   lives alone here in [place].  He has 1 son, who lives in the [place], and he has     an uncle, who lives nearby as well.      Family History    Problem Relation Age of Onset  . Cirrhosis Mother     died at 22  . Colon cancer Neg Hx   . Stomach cancer Neg Hx      ROS:  Please see the history of present illness.     All other systems reviewed and negative.    Physical Exam: Blood pressure 186/62, pulse 53, height 6' (1.829 m), weight 206 lb (93.441 kg). General: Well developed, well nourished male in no acute distress. Head: Normocephalic, atraumatic, sclera non-icteric, no xanthomas, nares  are without discharge. EENT: normal Lymph Nodes:  none Back: without scoliosis/kyphosis, no CVA tendersness Neck: Negative for carotid bruits. JVD not elevated. Lungs: Clear bilaterally to auscultation without wheezes, rales, or rhonchi. Breathing is unlabored. Heart: irregularly irregular rate with 2/6  murmur , rubs, or gallops appreciated. Abdomen: Soft, non-tender, non-distended with normoactive bowel sounds. No hepatomegaly. No rebound/guarding. No obvious abdominal masses. Msk:  Strength and tone appear normal for age. Extremities: No clubbing or cyanosis. tr edema.  Distal pedal pulses are 2+ and equal bilaterally. Skin: Warm and Dry Neuro: Alert and oriented X 3. CN III-XII intact Grossly normal sensory and motor function . Psych:  Responds to questions appropriately with a normal affect.      Labs: Cardiac Enzymes No results for input(s): CKTOTAL, CKMB, TROPONINI in the last 72 hours. CBC Lab Results  Component Value Date   WBC 5.4 12/22/2014   HGB 11.8* 12/22/2014   HCT 35.4* 12/22/2014   MCV 88.3 12/22/2014   PLT 189.0 12/22/2014   PROTIME: No results for input(s): LABPROT, INR in the last 72 hours. Chemistry No results for input(s): NA, K, CL, CO2, BUN, CREATININE, CALCIUM, PROT, BILITOT, ALKPHOS, ALT, AST, GLUCOSE in the last 168 hours.  Invalid input(s): LABALBU Lipids Lab Results  Component Value Date   CHOL 111 04/11/2013   HDL 44.00 04/11/2013   LDLCALC 58 04/11/2013   TRIG 44.0 04/11/2013   BNP PRO  B NATRIURETIC PEPTIDE (BNP)  Date/Time Value Ref Range Status  10/30/2014 03:32 PM 128.0* 0.0 - 100.0 pg/mL Final  01/13/2014 01:07 PM 161.0* 0.0 - 100.0 pg/mL Final  10/06/2008 05:15 AM 142.0* 0.0 - 100.0 pg/mL Final   Thyroid Function Tests: No results for input(s): TSH, T4TOTAL, T3FREE, THYROIDAB in the last 72 hours.  Invalid input(s): FREET3    Miscellaneous Lab Results  Component Value Date   DDIMER * 10/06/2008    2.41        AT THE INHOUSE ESTABLISHED CUTOFF VALUE OF 0.48 ug/mL FEU, THIS ASSAY HAS BEEN DOCUMENTED IN THE LITERATURE TO HAVE A SENSITIVITY AND NEGATIVE PREDICTIVE VALUE OF AT LEAST 98 TO 99%.  THE TEST RESULT SHOULD BE CORRELATED WITH AN ASSESSMENT OF THE CLINICAL PROBABILITY OF DVT / VTE.    Radiology/Studies:  No results found.  EKG: Atrial flutter variable block 87 [place] in the inferior leads and upright Pwave in V1   Assessment and Plan:  Atrial flutter typical   CHF chronic/ acute diastolic  Cirrhosis  HTN  COPD  Have discussed with pt the role of catheter ablation to potentially reduce symptoms and the need for anticaogulation   He is aware that there is a moderate likelihood of atrial fibrillation developing subsequently which would further require the need for anticoagulation. Minimizing anticoagulation the context of his cirrhosis is important; apixaban was a great choice for him in the context of his liver disease.  His symptoms are modest but according to Dr. London Sheer clearly his symptoms have worsened since atrial fluuter Ensued.    his blood pressures also major issue. We'll begin him on hydralazine. 25 mg twice daily. Amlodipine was discontinued because of headaches.   Based on the above we have chosen to proceed with cathter ablation  We have reviewed benefits and risks including bleeding death vascular injury and recurrence of atrial arrhtyhmia He voices understanding and agrees to proceed   Virl Axe   2

## 2015-01-03 ENCOUNTER — Telehealth: Payer: Self-pay | Admitting: Internal Medicine

## 2015-01-03 DIAGNOSIS — I4892 Unspecified atrial flutter: Secondary | ICD-10-CM

## 2015-01-03 NOTE — Telephone Encounter (Signed)
New Message:  Pt was forwarded to our office wanting to get further details on an Ablastion that Dr. Caryl Comes was going to be set up for him. Can you assist the pt with this matter?  Thank you

## 2015-01-05 ENCOUNTER — Encounter: Payer: Self-pay | Admitting: *Deleted

## 2015-01-05 NOTE — Telephone Encounter (Signed)
I spoke with the patient and made him aware I have scheduled him for his a-flutter ablation on 01/18/15 at 10:30 am. I have advised him he will need labs on 01/16/15 and that there is a small chance the time of his procedure may change. He will need an EKG 48 hours prior. I will forward instructions to him for his procedure via MyChart per his request.

## 2015-01-11 ENCOUNTER — Other Ambulatory Visit: Payer: Self-pay | Admitting: Internal Medicine

## 2015-01-11 DIAGNOSIS — I483 Typical atrial flutter: Secondary | ICD-10-CM

## 2015-01-16 ENCOUNTER — Other Ambulatory Visit (INDEPENDENT_AMBULATORY_CARE_PROVIDER_SITE_OTHER): Payer: Medicare Other | Admitting: *Deleted

## 2015-01-16 ENCOUNTER — Ambulatory Visit (INDEPENDENT_AMBULATORY_CARE_PROVIDER_SITE_OTHER): Payer: Medicare Other | Admitting: *Deleted

## 2015-01-16 VITALS — BP 172/53 | HR 50 | Ht 72.0 in | Wt 213.0 lb

## 2015-01-16 DIAGNOSIS — I4892 Unspecified atrial flutter: Secondary | ICD-10-CM

## 2015-01-16 LAB — CBC WITH DIFFERENTIAL/PLATELET
Basophils Absolute: 0 10*3/uL (ref 0.0–0.1)
Basophils Relative: 0.8 % (ref 0.0–3.0)
Eosinophils Absolute: 0.2 10*3/uL (ref 0.0–0.7)
Eosinophils Relative: 2.9 % (ref 0.0–5.0)
HCT: 30.7 % — ABNORMAL LOW (ref 39.0–52.0)
Hemoglobin: 10.3 g/dL — ABNORMAL LOW (ref 13.0–17.0)
Lymphocytes Relative: 22 % (ref 12.0–46.0)
Lymphs Abs: 1.4 10*3/uL (ref 0.7–4.0)
MCHC: 33.4 g/dL (ref 30.0–36.0)
MCV: 87.7 fl (ref 78.0–100.0)
Monocytes Absolute: 0.7 10*3/uL (ref 0.1–1.0)
Monocytes Relative: 10.4 % (ref 3.0–12.0)
Neutro Abs: 4.2 10*3/uL (ref 1.4–7.7)
Neutrophils Relative %: 63.9 % (ref 43.0–77.0)
Platelets: 182 10*3/uL (ref 150.0–400.0)
RBC: 3.5 Mil/uL — ABNORMAL LOW (ref 4.22–5.81)
RDW: 13.7 % (ref 11.5–15.5)
WBC: 6.5 10*3/uL (ref 4.0–10.5)

## 2015-01-16 LAB — BASIC METABOLIC PANEL
BUN: 33 mg/dL — ABNORMAL HIGH (ref 6–23)
CO2: 25 mEq/L (ref 19–32)
Calcium: 8.9 mg/dL (ref 8.4–10.5)
Chloride: 109 mEq/L (ref 96–112)
Creatinine, Ser: 1.42 mg/dL (ref 0.40–1.50)
GFR: 52.06 mL/min — ABNORMAL LOW (ref >60.00)
Glucose, Bld: 102 mg/dL — ABNORMAL HIGH (ref 70–99)
Potassium: 4.2 mEq/L (ref 3.5–5.1)
Sodium: 140 mEq/L (ref 135–145)

## 2015-01-16 NOTE — Progress Notes (Signed)
1.) Reason for visit: EKG. Pt on A-flutter rate 50 beats/minute.  2.) Name of MD requesting visit: Dr. Caryl Comes MD  3.) H&P: A-Flutter,CAD,post CABG's 2010  4.) ROS related to problem: Aflutter  5.) Assessment and plan per MD: Pt on Aflutter pt to continue taken anticoagulant medication Elaquis. Pt to  continue with plan for Ablation on 01/18/15.

## 2015-01-16 NOTE — Addendum Note (Signed)
Addended by: Domenica Reamer R on: 01/16/2015 10:10 AM   Modules accepted: Orders

## 2015-01-16 NOTE — Patient Instructions (Signed)
Pt to continue anticoagulant Eliquis 5 mg two times a day. Continue with ablation plan on 01/18/15. Pt verbalized understanding.

## 2015-01-17 ENCOUNTER — Telehealth: Payer: Self-pay | Admitting: Internal Medicine

## 2015-01-17 NOTE — Telephone Encounter (Signed)
Pt will be going into surgery on Thursday and will be released on Friday. He is hoping he can go ahead and pick up his Oxycodone HCl 20 MG TABS [[id number] prescription on his way home on Friday because he is not sure how he will feel  Please advise

## 2015-01-18 ENCOUNTER — Encounter (HOSPITAL_COMMUNITY)
Admission: RE | Disposition: A | Discharge status: Home / Self Care | Payer: Self-pay | Source: Ambulatory Visit | Attending: Internal Medicine

## 2015-01-18 ENCOUNTER — Telehealth: Payer: Self-pay | Admitting: *Deleted

## 2015-01-18 ENCOUNTER — Ambulatory Visit (HOSPITAL_COMMUNITY)
Admission: RE | Admit: 2015-01-18 | Discharge: 2015-01-19 | Disposition: A | Discharge status: Home / Self Care | Payer: Medicare Other | Source: Ambulatory Visit | Attending: Internal Medicine | Admitting: Internal Medicine

## 2015-01-18 ENCOUNTER — Encounter (HOSPITAL_COMMUNITY): Payer: Self-pay | Admitting: Critical Care Medicine

## 2015-01-18 ENCOUNTER — Ambulatory Visit (HOSPITAL_COMMUNITY): Payer: Medicare Other | Admitting: Critical Care Medicine

## 2015-01-18 DIAGNOSIS — R0602 Shortness of breath: Secondary | ICD-10-CM | POA: Diagnosis not present

## 2015-01-18 DIAGNOSIS — I10 Essential (primary) hypertension: Secondary | ICD-10-CM | POA: Insufficient documentation

## 2015-01-18 DIAGNOSIS — I252 Old myocardial infarction: Secondary | ICD-10-CM | POA: Insufficient documentation

## 2015-01-18 DIAGNOSIS — Z7901 Long term (current) use of anticoagulants: Secondary | ICD-10-CM | POA: Diagnosis not present

## 2015-01-18 DIAGNOSIS — Z951 Presence of aortocoronary bypass graft: Secondary | ICD-10-CM | POA: Diagnosis not present

## 2015-01-18 DIAGNOSIS — I5032 Chronic diastolic (congestive) heart failure: Secondary | ICD-10-CM | POA: Insufficient documentation

## 2015-01-18 DIAGNOSIS — I350 Nonrheumatic aortic (valve) stenosis: Secondary | ICD-10-CM | POA: Diagnosis not present

## 2015-01-18 DIAGNOSIS — I4892 Unspecified atrial flutter: Secondary | ICD-10-CM | POA: Diagnosis present

## 2015-01-18 DIAGNOSIS — E785 Hyperlipidemia, unspecified: Secondary | ICD-10-CM | POA: Diagnosis not present

## 2015-01-18 DIAGNOSIS — I483 Typical atrial flutter: Secondary | ICD-10-CM | POA: Insufficient documentation

## 2015-01-18 DIAGNOSIS — I509 Heart failure, unspecified: Secondary | ICD-10-CM | POA: Diagnosis not present

## 2015-01-18 DIAGNOSIS — I2510 Atherosclerotic heart disease of native coronary artery without angina pectoris: Secondary | ICD-10-CM | POA: Insufficient documentation

## 2015-01-18 DIAGNOSIS — Z87891 Personal history of nicotine dependence: Secondary | ICD-10-CM | POA: Insufficient documentation

## 2015-01-18 DIAGNOSIS — J449 Chronic obstructive pulmonary disease, unspecified: Secondary | ICD-10-CM | POA: Insufficient documentation

## 2015-01-18 DIAGNOSIS — I482 Chronic atrial fibrillation, unspecified: Secondary | ICD-10-CM | POA: Diagnosis present

## 2015-01-18 HISTORY — PX: ELECTROPHYSIOLOGIC STUDY: SHX172A

## 2015-01-18 SURGERY — A-FLUTTER/A-TACH/SVT ABLATION
Anesthesia: General

## 2015-01-18 MED ORDER — NYSTATIN 100000 UNIT/GM EX OINT
1.0000 "application " | TOPICAL_OINTMENT | Freq: Two times a day (BID) | CUTANEOUS | Status: DC
  Administered 2015-01-18 – 2015-01-19 (×2): 1 via TOPICAL
  Filled 2015-01-18: qty 15

## 2015-01-18 MED ORDER — FUROSEMIDE 40 MG PO TABS: 40.0000 mg | ORAL_TABLET | Freq: Every day | ORAL | Status: DC

## 2015-01-18 MED ORDER — BUPIVACAINE HCL (PF) 0.25 % IJ SOLN
INTRAMUSCULAR | Status: DC | PRN
  Administered 2015-01-18: 40 mL

## 2015-01-18 MED ORDER — SODIUM CHLORIDE 0.9 % IJ SOLN: 3.0000 mL | INTRAMUSCULAR | Status: DC | PRN

## 2015-01-18 MED ORDER — GUAIFENESIN ER 600 MG PO TB12
1200.0000 mg | ORAL_TABLET | Freq: Two times a day (BID) | ORAL | Status: DC
  Administered 2015-01-18 – 2015-01-19 (×2): 1200 mg via ORAL
  Filled 2015-01-18 (×3): qty 2

## 2015-01-18 MED ORDER — ALBUTEROL SULFATE (2.5 MG/3ML) 0.083% IN NEBU
3.0000 mL | INHALATION_SOLUTION | Freq: Four times a day (QID) | RESPIRATORY_TRACT | Status: DC
  Administered 2015-01-18: 20:00:00 3 mL via RESPIRATORY_TRACT
  Filled 2015-01-18: qty 3

## 2015-01-18 MED ORDER — SODIUM CHLORIDE 0.9 % IV SOLN
INTRAVENOUS | Status: DC
  Administered 2015-01-18: 09:00:00 via INTRAVENOUS

## 2015-01-18 MED ORDER — LIDOCAINE HCL (CARDIAC) 20 MG/ML IV SOLN
INTRAVENOUS | Status: DC | PRN
  Administered 2015-01-18: 40 mg via INTRAVENOUS

## 2015-01-18 MED ORDER — OFF THE BEAT BOOK
Freq: Once | Status: AC
  Administered 2015-01-18: 20:00:00
  Filled 2015-01-18: qty 1

## 2015-01-18 MED ORDER — FUROSEMIDE 10 MG/ML IJ SOLN
40.0000 mg | Freq: Four times a day (QID) | INTRAMUSCULAR | Status: AC
  Administered 2015-01-18 (×2): 40 mg via INTRAVENOUS
  Filled 2015-01-18 (×2): qty 4

## 2015-01-18 MED ORDER — GLYCOPYRROLATE 0.2 MG/ML IJ SOLN
INTRAMUSCULAR | Status: DC | PRN
  Administered 2015-01-18 (×2): 0.1 mg via INTRAVENOUS

## 2015-01-18 MED ORDER — APIXABAN 5 MG PO TABS
5.0000 mg | ORAL_TABLET | Freq: Two times a day (BID) | ORAL | Status: DC
  Administered 2015-01-18 – 2015-01-19 (×2): 5 mg via ORAL
  Filled 2015-01-18 (×2): qty 1

## 2015-01-18 MED ORDER — EPHEDRINE SULFATE 50 MG/ML IJ SOLN
INTRAMUSCULAR | Status: DC | PRN
  Administered 2015-01-18: 5 mg via INTRAVENOUS
  Administered 2015-01-18 (×4): 10 mg via INTRAVENOUS
  Administered 2015-01-18: 5 mg via INTRAVENOUS

## 2015-01-18 MED ORDER — ACETAMINOPHEN 325 MG PO TABS: 650.0000 mg | ORAL_TABLET | ORAL | Status: DC | PRN

## 2015-01-18 MED ORDER — MIDAZOLAM HCL 5 MG/5ML IJ SOLN
INTRAMUSCULAR | Status: DC | PRN
  Administered 2015-01-18: 2 mg via INTRAVENOUS

## 2015-01-18 MED ORDER — TRAZODONE HCL 50 MG PO TABS: 25.0000 mg | ORAL_TABLET | Freq: Every evening | ORAL | Status: DC | PRN

## 2015-01-18 MED ORDER — ALBUTEROL SULFATE (2.5 MG/3ML) 0.083% IN NEBU: 3.0000 mL | INHALATION_SOLUTION | Freq: Four times a day (QID) | RESPIRATORY_TRACT | Status: DC | PRN

## 2015-01-18 MED ORDER — SODIUM CHLORIDE 0.9 % IV SOLN: 250.0000 mL | INTRAVENOUS | Status: DC | PRN

## 2015-01-18 MED ORDER — BUPROPION HCL ER (XL) 150 MG PO TB24
150.0000 mg | ORAL_TABLET | Freq: Every day | ORAL | Status: DC
  Administered 2015-01-19: 150 mg via ORAL
  Filled 2015-01-18: qty 1

## 2015-01-18 MED ORDER — ONDANSETRON HCL 4 MG/2ML IJ SOLN
4.0000 mg | Freq: Four times a day (QID) | INTRAMUSCULAR | Status: DC | PRN
  Administered 2015-01-18: 18:00:00 4 mg via INTRAVENOUS
  Filled 2015-01-18: qty 2

## 2015-01-18 MED ORDER — ONDANSETRON HCL 4 MG/2ML IJ SOLN
INTRAMUSCULAR | Status: DC | PRN
  Administered 2015-01-18: 4 mg via INTRAVENOUS

## 2015-01-18 MED ORDER — HYDRALAZINE HCL 25 MG PO TABS
25.0000 mg | ORAL_TABLET | Freq: Two times a day (BID) | ORAL | Status: DC
  Administered 2015-01-18 – 2015-01-19 (×2): 25 mg via ORAL
  Filled 2015-01-18 (×3): qty 1

## 2015-01-18 MED ORDER — FENTANYL CITRATE (PF) 100 MCG/2ML IJ SOLN: 25.0000 ug | INTRAMUSCULAR | Status: DC | PRN

## 2015-01-18 MED ORDER — FINASTERIDE 5 MG PO TABS
5.0000 mg | ORAL_TABLET | Freq: Every day | ORAL | Status: DC
  Administered 2015-01-19: 11:00:00 5 mg via ORAL
  Filled 2015-01-18: qty 1

## 2015-01-18 MED ORDER — ALBUTEROL SULFATE (2.5 MG/3ML) 0.083% IN NEBU
3.0000 mL | INHALATION_SOLUTION | Freq: Three times a day (TID) | RESPIRATORY_TRACT | Status: DC
  Administered 2015-01-19 (×2): 3 mL via RESPIRATORY_TRACT
  Filled 2015-01-18 (×2): qty 3

## 2015-01-18 MED ORDER — OXYCODONE HCL 5 MG PO TABS
20.0000 mg | ORAL_TABLET | ORAL | Status: DC | PRN
  Administered 2015-01-18 – 2015-01-19 (×3): 10 mg via ORAL
  Administered 2015-01-19: 20 mg via ORAL
  Filled 2015-01-18: qty 4
  Filled 2015-01-18: qty 8
  Filled 2015-01-18 (×2): qty 4
  Filled 2015-01-18: qty 8
  Filled 2015-01-18: qty 4

## 2015-01-18 MED ORDER — ONDANSETRON HCL 4 MG/2ML IJ SOLN: 4.0000 mg | Freq: Once | INTRAMUSCULAR | Status: DC | PRN

## 2015-01-18 MED ORDER — BUPIVACAINE HCL (PF) 0.25 % IJ SOLN
INTRAMUSCULAR | Status: AC
  Filled 2015-01-18: qty 60

## 2015-01-18 MED ORDER — SUCCINYLCHOLINE CHLORIDE 20 MG/ML IJ SOLN
INTRAMUSCULAR | Status: DC | PRN
  Administered 2015-01-18: 120 mg via INTRAVENOUS

## 2015-01-18 MED ORDER — PROPOFOL 10 MG/ML IV BOLUS
INTRAVENOUS | Status: DC | PRN
  Administered 2015-01-18: 40 mg via INTRAVENOUS
  Administered 2015-01-18: 160 mg via INTRAVENOUS

## 2015-01-18 MED ORDER — SODIUM CHLORIDE 0.9 % IV SOLN
INTRAVENOUS | Status: AC
  Administered 2015-01-18: 15:00:00 via INTRAVENOUS

## 2015-01-18 MED ORDER — FENTANYL CITRATE (PF) 100 MCG/2ML IJ SOLN
INTRAMUSCULAR | Status: DC | PRN
  Administered 2015-01-18: 100 ug via INTRAVENOUS
  Administered 2015-01-18: 50 ug via INTRAVENOUS
  Administered 2015-01-18 (×2): 25 ug via INTRAVENOUS
  Administered 2015-01-18: 50 ug via INTRAVENOUS

## 2015-01-18 MED ORDER — SODIUM CHLORIDE 0.9 % IJ SOLN: 3.0000 mL | Freq: Two times a day (BID) | INTRAMUSCULAR | Status: DC

## 2015-01-18 MED ORDER — LOSARTAN POTASSIUM 50 MG PO TABS
100.0000 mg | ORAL_TABLET | Freq: Every day | ORAL | Status: DC
  Administered 2015-01-19: 100 mg via ORAL
  Filled 2015-01-18: qty 2

## 2015-01-18 MED ORDER — TAMSULOSIN HCL 0.4 MG PO CAPS
0.4000 mg | ORAL_CAPSULE | Freq: Every day | ORAL | Status: DC
  Administered 2015-01-19: 0.4 mg via ORAL
  Filled 2015-01-18: qty 1

## 2015-01-18 MED ORDER — ATORVASTATIN CALCIUM 40 MG PO TABS
40.0000 mg | ORAL_TABLET | Freq: Every day | ORAL | Status: DC
  Administered 2015-01-19: 09:00:00 40 mg via ORAL
  Filled 2015-01-18: qty 1

## 2015-01-18 MED ORDER — POTASSIUM CHLORIDE CRYS ER 20 MEQ PO TBCR
40.0000 meq | EXTENDED_RELEASE_TABLET | Freq: Every day | ORAL | Status: DC
  Administered 2015-01-18 – 2015-01-19 (×2): 40 meq via ORAL
  Filled 2015-01-18 (×2): qty 2

## 2015-01-18 MED ORDER — OXYCODONE HCL 20 MG PO TABS: 20.0000 mg | ORAL_TABLET | ORAL | Status: DC | PRN

## 2015-01-18 MED ORDER — AMLODIPINE BESYLATE 5 MG PO TABS
5.0000 mg | ORAL_TABLET | Freq: Every day | ORAL | Status: DC
  Filled 2015-01-18 (×2): qty 1

## 2015-01-18 SURGICAL SUPPLY — 14 items
BAG SNAP BAND KOVER 36X36 (MISCELLANEOUS) ×3 IMPLANT
BLANKET WARM UNDERBOD FULL ACC (MISCELLANEOUS) ×3 IMPLANT
CATH BLAZERPRIME XP LG CV 10MM (ABLATOR) ×3 IMPLANT
CATH DUODECA HALO/ISMUS 7FR (CATHETERS) ×3 IMPLANT
CATH OCTAPOLOR 6F 125CM 2-5-2 (CATHETERS) ×3 IMPLANT
CATH QUAD COURNAND 5FR (CATHETERS) ×3 IMPLANT
PACK EP LATEX FREE (CUSTOM PROCEDURE TRAY) ×2
PACK EP LF (CUSTOM PROCEDURE TRAY) ×1 IMPLANT
PAD DEFIB LIFELINK (PAD) ×3 IMPLANT
SHEATH ATRIAL FLUTTER SAFL 8F (SHEATH) ×3 IMPLANT
SHEATH PINNACLE 6F 10CM (SHEATH) ×3 IMPLANT
SHEATH PINNACLE 7F 10CM (SHEATH) ×3 IMPLANT
SHEATH PINNACLE 8F 10CM (SHEATH) ×6 IMPLANT
SHIELD RADPAD SCOOP 12X17 (MISCELLANEOUS) ×6 IMPLANT

## 2015-01-18 NOTE — Progress Notes (Addendum)
Site area: lt groin fv sheaths X 2 Site Prior to Removal:  Level  0. Note--skin irration from [employer] prep) around puncture site and at skin fold Pressure Applied For:  15 minutes Manual:   yes Patient Status During Pull:  stable Post Pull Site:  Level  0 Post Pull Instructions Given:  yes Post Pull Pulses Present: yes Dressing Applied:  Small tegaderm Bedrest begins @  Comments:

## 2015-01-18 NOTE — Telephone Encounter (Signed)
Duplicate msg md has ok rx & he has been notified for pick-up...[name]

## 2015-01-18 NOTE — Progress Notes (Addendum)
Site area: rt groin fv sheaths X 2 Site Prior to Removal:  Level  0 Pressure Applied For:  15 minutes Manual:   yes Patient Status During Pull:  stable Post Pull Site:  Level   0   Post Pull Instructions Given:  yes Post Pull Pulses Present: yes Dressing Applied:  Small tegaderm Bedrest begins @  1430 Comments:  IV saline locked

## 2015-01-18 NOTE — Telephone Encounter (Signed)
Printed and signed.  

## 2015-01-18 NOTE — H&P (View-Only) (Signed)
ELECTROPHYSIOLOGY CONSULT NOTE  Patient ID: [name], MRN: [id number], DOB/AGE: [birth year] 74 y.o. Admit date: (Not on file) Date of Consult: 01/01/2015  Primary Physician: Olga Millers, MD Primary Cardiologist: [facility]  Chief Complaint: Aflutter    HPI [name] is a 74 y.o. male  Seen for treatment options of atrial flutter.  This has been unassociated with palpitations. However, when it was identified 8/16 by Dr. London Sheer, his note outlined a three-month history of increasing exercise intolerance   He has a history of coronary artery disease is status post CABG 2010. He had a non-STEMI at that time. Most recent assessment of LV function echo 8/16 demonstrated normal LV function with mild aortic stenosis left atrial dimension was 43/1.9/31  Last catheterization 6/10 prior to his bypass     Past Medical History  Diagnosis Date  . 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 COPD (chronic obstructive pulmonary disease)   . Cerebrovascular disease   . AAA (abdominal aortic aneurysm)   . 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 Hyperlipidemia   . HTN (hypertension)   . CONGENITAL UNSPEC REDUCTION DEFORMITY LOWER LIMB   . TOBACCO USE, QUIT   . CHF (congestive heart failure)   . 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 Cirrhosis     on CT a/p 07/2013, no longer drinking  . 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 AAA (abdominal aortic aneurysm)   . Carotid stenosis   . Shortness of breath     EXERTION  . Neuromuscular disorder     PT STATES HE HAS BEEN TOLD HE EITHER HAD POLIO OR CEREBRAL PALSY - STATES HIS LEFT LEG IS SHORTER AND AFFECTS HIS BALANCE - HE WEARS ELEVATED SHOE -LEFT  . Foley catheter in place   . Blindness of left eye   . Pain     BACK, LEGS AND ARMS  . Constipation due to opioid therapy 2014    began about a year ago      Surgical History:  Past Surgical History    Procedure Laterality Date  . Coronary artery bypass graft  11/03/2008    Ricard Dillon - x4: left internal mammary artery to the distal left anterior descending, saphemous vein graft to the first circumflex marginal branch with a Y graft sequentiallly to a left posterolateral branch, saphenous vein graft to the distal right coronary artery  . Green light laser turp (transurethral resection of prostate N/A 02/14/2014    Procedure: GREEN LIGHT LASER TURP (TRANSURETHRAL RESECTION OF PROSTATE;  Surgeon: Festus Aloe, MD;  Location: [facility];  Service: Urology;  Laterality: N/A;     Home Meds: Prior to Admission medications   Medication Sig Start Date End Date Taking? Authorizing Provider  albuterol (PROVENTIL HFA;VENTOLIN HFA) 108 (90 BASE) MCG/ACT inhaler Inhale 2 puffs into the lungs every 6 (six) hours as needed for wheezing or shortness of breath. 09/12/14  Yes Rowe Clack, MD  apixaban (ELIQUIS) 5 MG TABS tablet Take 1 tablet (5 mg total) by mouth 2 (two) times daily. 11/24/14  Yes Lelon Perla, MD  atorvastatin (LIPITOR) 40 MG tablet Take 1 tablet (40 mg total) by mouth daily at 6 PM. 11/24/14  Yes Olga Millers, MD  buPROPion (WELLBUTRIN XL) 150 MG 24 hr tablet TAKE 1 TABLET EVERY DAY 06/02/14  Yes Olga Millers, MD  finasteride (PROSCAR) 5 MG tablet Take  1 tablet (5 mg total) by mouth daily. EVERY EVENING 09/14/14  Yes Olga Millers, MD  fluticasone [facility]) 50 MCG/ACT nasal spray PLACE 1 SPRAY INTO BOTH NOSTRILS EVERY MORNING. 08/23/14  Yes Olga Millers, MD  furosemide (LASIX) 40 MG tablet Take 1 tablet (40 mg total) by mouth daily. EVERY EVENING 08/10/14  Yes Lelon Perla, MD  guaiFENesin (MUCINEX) 600 MG 12 hr tablet Take 1,200 mg by mouth 2 (two) times daily.     Yes Historical Provider, MD  losartan (COZAAR) 100 MG tablet Take 1 tablet (100 mg total) by mouth daily. EVERY EVENING 03/24/14  Yes Lelon Perla, MD  nystatin ointment (MYCOSTATIN) Apply 1 application  topically 2 (two) times daily.   Yes Historical Provider, MD  Oxycodone HCl 20 MG TABS Take 1-2 tablets (20-40 mg total) by mouth every 4 (four) hours as needed. 12/21/14  Yes Olga Millers, MD  polyethylene glycol powder (GLYCOLAX/MIRALAX) powder Take 17 g by mouth 2 (two) times daily as needed. 02/28/14  Yes Olga Millers, MD  potassium chloride SA (K-DUR,KLOR-CON) 20 MEQ tablet Take 2 tablets (40 mEq total) by mouth daily. EVERY EVENING 04/26/14  Yes Lelon Perla, MD  tamsulosin (FLOMAX) 0.4 MG CAPS capsule TAKE 1 CAPSULE (0.4 MG TOTAL) BY MOUTH DAILY AFTER SUPPER. 08/25/14  Yes Olga Millers, MD  triamcinolone cream (KENALOG) 0.1 % Apply 1 application topically 2 (two) times daily.   Yes Historical Provider, MD  VENTOLIN HFA 108 (90 BASE) MCG/ACT inhaler INHALE 2 PUFFS INTO THE LUNGS EVERY 6 HOURS AS NEEDED FOR WHEEZING. 09/12/14  Yes Olga Millers, MD  amLODipine (NORVASC) 5 MG tablet Take 1 tablet (5 mg total) by mouth daily. Patient not taking: Reported on 01/01/2015 11/24/14   Lelon Perla, MD      Allergies:  Allergies  Allergen Reactions  . Other     STATES HE HAS NOT EATEN ANY FISH INCLUDING SHELLFISH FOR PAST 40 YRS - IT CAUSED NAUSEA  . Tylenol [Acetaminophen]     Liver problems  . Hydrocodone Itching and Rash    Social History   Social History  . Marital Status: Widowed    Spouse Name: N/A  . Number of Children: N/A  . Years of Education: N/A   Occupational History  . Not on file.   Social History Main Topics  . Smoking status: Former Research scientist (life sciences)  . Smokeless tobacco: Never Used  . Alcohol Use: No     Comment: History of heavy alcohol use per pt. Quit many years ago1/1/ 2005  . Drug Use: No  . Sexual Activity: Not on file   Other Topics Concern  . Not on file   Social History Narrative   lives alone here in [place].  He has 1 son, who lives in the [place], and he has     an uncle, who lives nearby as well.      Family History    Problem Relation Age of Onset  . Cirrhosis Mother     died at 33  . Colon cancer Neg Hx   . Stomach cancer Neg Hx      ROS:  Please see the history of present illness.     All other systems reviewed and negative.    Physical Exam: Blood pressure 186/62, pulse 53, height 6' (1.829 m), weight 206 lb (93.441 kg). General: Well developed, well nourished male in no acute distress. Head: Normocephalic, atraumatic, sclera non-icteric, no xanthomas, nares  are without discharge. EENT: normal Lymph Nodes:  none Back: without scoliosis/kyphosis, no CVA tendersness Neck: Negative for carotid bruits. JVD not elevated. Lungs: Clear bilaterally to auscultation without wheezes, rales, or rhonchi. Breathing is unlabored. Heart: irregularly irregular rate with 2/6  murmur , rubs, or gallops appreciated. Abdomen: Soft, non-tender, non-distended with normoactive bowel sounds. No hepatomegaly. No rebound/guarding. No obvious abdominal masses. Msk:  Strength and tone appear normal for age. Extremities: No clubbing or cyanosis. tr edema.  Distal pedal pulses are 2+ and equal bilaterally. Skin: Warm and Dry Neuro: Alert and oriented X 3. CN III-XII intact Grossly normal sensory and motor function . Psych:  Responds to questions appropriately with a normal affect.      Labs: Cardiac Enzymes No results for input(s): CKTOTAL, CKMB, TROPONINI in the last 72 hours. CBC Lab Results  Component Value Date   WBC 5.4 12/22/2014   HGB 11.8* 12/22/2014   HCT 35.4* 12/22/2014   MCV 88.3 12/22/2014   PLT 189.0 12/22/2014   PROTIME: No results for input(s): LABPROT, INR in the last 72 hours. Chemistry No results for input(s): NA, K, CL, CO2, BUN, CREATININE, CALCIUM, PROT, BILITOT, ALKPHOS, ALT, AST, GLUCOSE in the last 168 hours.  Invalid input(s): LABALBU Lipids Lab Results  Component Value Date   CHOL 111 04/11/2013   HDL 44.00 04/11/2013   LDLCALC 58 04/11/2013   TRIG 44.0 04/11/2013   BNP PRO  B NATRIURETIC PEPTIDE (BNP)  Date/Time Value Ref Range Status  10/30/2014 03:32 PM 128.0* 0.0 - 100.0 pg/mL Final  01/13/2014 01:07 PM 161.0* 0.0 - 100.0 pg/mL Final  10/06/2008 05:15 AM 142.0* 0.0 - 100.0 pg/mL Final   Thyroid Function Tests: No results for input(s): TSH, T4TOTAL, T3FREE, THYROIDAB in the last 72 hours.  Invalid input(s): FREET3    Miscellaneous Lab Results  Component Value Date   DDIMER * 10/06/2008    2.41        AT THE INHOUSE ESTABLISHED CUTOFF VALUE OF 0.48 ug/mL FEU, THIS ASSAY HAS BEEN DOCUMENTED IN THE LITERATURE TO HAVE A SENSITIVITY AND NEGATIVE PREDICTIVE VALUE OF AT LEAST 98 TO 99%.  THE TEST RESULT SHOULD BE CORRELATED WITH AN ASSESSMENT OF THE CLINICAL PROBABILITY OF DVT / VTE.    Radiology/Studies:  No results found.  EKG: Atrial flutter variable block 64 [place] in the inferior leads and upright Pwave in V1   Assessment and Plan:  Atrial flutter typical   CHF chronic/ acute diastolic  Cirrhosis  HTN  COPD  Have discussed with pt the role of catheter ablation to potentially reduce symptoms and the need for anticaogulation   He is aware that there is a moderate likelihood of atrial fibrillation developing subsequently which would further require the need for anticoagulation. Minimizing anticoagulation the context of his cirrhosis is important; apixaban was a great choice for him in the context of his liver disease.  His symptoms are modest but according to Dr. London Sheer clearly his symptoms have worsened since atrial fluuter Ensued.    his blood pressures also major issue. We'll begin him on hydralazine. 25 mg twice daily. Amlodipine was discontinued because of headaches.   Based on the above we have chosen to proceed with cathter ablation  We have reviewed benefits and risks including bleeding death vascular injury and recurrence of atrial arrhtyhmia He voices understanding and agrees to proceed   Virl Axe   2

## 2015-01-18 NOTE — Anesthesia Procedure Notes (Signed)
Procedure Name: Intubation Date/Time: 01/18/2015 11:15 AM Performed by: Merrilyn Puma B Pre-anesthesia Checklist: Patient identified, Timeout performed, Emergency Drugs available, Suction available and Patient being monitored Patient Re-evaluated:Patient Re-evaluated prior to inductionOxygen Delivery Method: Circle system utilized Preoxygenation: Pre-oxygenation with 100% oxygen Intubation Type: IV induction Ventilation: Mask ventilation without difficulty Laryngoscope Size: Mac and 4 Grade View: Grade I Tube type: Oral Tube size: 7.5 mm Number of attempts: 1 Airway Equipment and Method: Stylet Placement Confirmation: CO2 detector,  positive ETCO2,  ETT inserted through vocal cords under direct vision and breath sounds checked- equal and bilateral Secured at: 22 cm Tube secured with: Tape Dental Injury: Teeth and Oropharynx as per pre-operative assessment

## 2015-01-18 NOTE — Telephone Encounter (Signed)
[name]...[name]

## 2015-01-18 NOTE — Anesthesia Postprocedure Evaluation (Signed)
  Anesthesia Post-op Note  Patient: [name]  Procedure(s) Performed: Procedure(s): A-Flutter Ablation (N/A)  Patient Location: Cardiac cath lab  Anesthesia Type:General  Level of Consciousness: awake, alert  and oriented  Airway and Oxygen Therapy: Patient Spontanous Breathing and Patient connected to nasal cannula oxygen  Post-op Pain: mild  Post-op Assessment: Post-op Vital signs reviewed, Patient's Cardiovascular Status Stable, Respiratory Function Stable, Patent Airway and Pain level controlled              Post-op Vital Signs: stable  Last Vitals:  Filed Vitals:   01/18/15 1425  BP: 145/42  Pulse: 73  Temp:   Resp: 17    Complications: No apparent anesthesia complications

## 2015-01-18 NOTE — Telephone Encounter (Signed)
Left message informing patient.

## 2015-01-18 NOTE — Anesthesia Preprocedure Evaluation (Signed)
Anesthesia Evaluation  Patient identified by MRN, date of birth, ID band Patient awake    Reviewed: Allergy & Precautions, NPO status , Patient's Chart, lab work & pertinent test results  Airway Mallampati: II  TM Distance: >3 FB Neck ROM: Full    Dental  (+) Edentulous Upper, Edentulous Lower   Pulmonary former smoker,    breath sounds clear to auscultation       Cardiovascular hypertension,  Rhythm:Regular Rate:Normal     Neuro/Psych    GI/Hepatic   Endo/Other    Renal/GU      Musculoskeletal   Abdominal   Peds  Hematology   Anesthesia Other Findings   Reproductive/Obstetrics                             Anesthesia Physical Anesthesia Plan  ASA: III  Anesthesia Plan: General   Post-op Pain Management:    Induction: Intravenous  Airway Management Planned: Oral ETT  Additional Equipment:   Intra-op Plan:   Post-operative Plan: Extubation in OR  Informed Consent: I have reviewed the patients History and Physical, chart, labs and discussed the procedure including the risks, benefits and alternatives for the proposed anesthesia with the patient or authorized representative who has indicated his/her understanding and acceptance.     Plan Discussed with: CRNA and Anesthesiologist  Anesthesia Plan Comments:         Anesthesia Quick Evaluation

## 2015-01-18 NOTE — Transfer of Care (Signed)
Immediate Anesthesia Transfer of Care Note  Patient: [name]  Procedure(s) Performed: Procedure(s): A-Flutter Ablation (N/A)  Patient Location: Cath Lab  Anesthesia Type:General  Level of Consciousness: awake, alert  and oriented  Airway & Oxygen Therapy: Patient Spontanous Breathing and Patient connected to nasal cannula oxygen  Post-op Assessment: Report given to RN, Post -op Vital signs reviewed and stable and Patient moving all extremities X 4  Post vital signs: Reviewed and stable  Last Vitals:  Filed Vitals:   01/18/15 0851  BP: 150/44  Pulse:   Temp:   Resp:     Complications: No apparent anesthesia complications

## 2015-01-18 NOTE — Progress Notes (Signed)
Dr. Caryl Comes paged and made aware that EKG looks like WB

## 2015-01-18 NOTE — Discharge Instructions (Signed)
No driving for 3 days. No lifting over 5 lbs for 1 week. No sexual activity for 1 week. Keep procedure site clean & dry. If you notice increased pain, swelling, bleeding or pus, call/return! You may shower, but no soaking baths/hot tubs/pools for 1 week. ° °

## 2015-01-18 NOTE — Interval H&P Note (Signed)
History and Physical Interval Note:  01/18/2015 10:23 AM  [name]  has presented today for surgery, with the diagnosis of atrial flutter  The various methods of treatment have been discussed with the patient and family. After consideration of risks, benefits and other options for treatment, the patient has consented to  Procedure(s): A-Flutter Ablation (N/A) as a surgical intervention .  The patient's history has been reviewed, patient examined, no change in status, stable for surgery.  I have reviewed the patient's chart and labs.  Questions were answered to the patient's satisfaction.     [name]  9 lb interval weight gain, some edema and worsening DOE  Able to lie flat PE >> JVP 9-10 Will plan aggressive diuresis    HgB a little low 12>>10   Will check stool quaiac  Cr elevated compared to year ago but better than 2 weeks ago, will follow as we diurese him

## 2015-01-19 DIAGNOSIS — I509 Heart failure, unspecified: Secondary | ICD-10-CM | POA: Diagnosis not present

## 2015-01-19 DIAGNOSIS — I483 Typical atrial flutter: Secondary | ICD-10-CM

## 2015-01-19 DIAGNOSIS — E785 Hyperlipidemia, unspecified: Secondary | ICD-10-CM | POA: Diagnosis not present

## 2015-01-19 DIAGNOSIS — Z951 Presence of aortocoronary bypass graft: Secondary | ICD-10-CM | POA: Diagnosis not present

## 2015-01-19 DIAGNOSIS — I2510 Atherosclerotic heart disease of native coronary artery without angina pectoris: Secondary | ICD-10-CM | POA: Diagnosis not present

## 2015-01-19 DIAGNOSIS — J449 Chronic obstructive pulmonary disease, unspecified: Secondary | ICD-10-CM | POA: Diagnosis not present

## 2015-01-19 DIAGNOSIS — I10 Essential (primary) hypertension: Secondary | ICD-10-CM | POA: Diagnosis not present

## 2015-01-19 LAB — BASIC METABOLIC PANEL
Anion gap: 7 (ref 5–15)
BUN: 20 mg/dL (ref 6–20)
CO2: 23 mmol/L (ref 22–32)
Calcium: 8.7 mg/dL — ABNORMAL LOW (ref 8.9–10.3)
Chloride: 111 mmol/L (ref 101–111)
Creatinine, Ser: 1.45 mg/dL — ABNORMAL HIGH (ref 0.61–1.24)
GFR calc Af Amer: 54 mL/min — ABNORMAL LOW (ref >60)
GFR calc non Af Amer: 47 mL/min — ABNORMAL LOW (ref >60)
Glucose, Bld: 124 mg/dL — ABNORMAL HIGH (ref 65–99)
Potassium: 4.8 mmol/L (ref 3.5–5.1)
Sodium: 141 mmol/L (ref 135–145)

## 2015-01-19 LAB — SEDIMENTATION RATE: Sed Rate: 35 mm/hr — ABNORMAL HIGH (ref 0–16)

## 2015-01-19 MED ORDER — FUROSEMIDE 10 MG/ML IJ SOLN
40.0000 mg | INTRAMUSCULAR | Status: AC
Start: 2015-01-19 — End: 2015-01-19
  Administered 2015-01-19 (×2): 40 mg via INTRAVENOUS
  Filled 2015-01-19 (×2): qty 4

## 2015-01-19 NOTE — Discharge Summary (Signed)
ELECTROPHYSIOLOGY PROCEDURE DISCHARGE SUMMARY    Patient ID: [name],  MRN: [id number], DOB/AGE: 10/28/1940 74 y.o.  Admit date: 01/18/2015 Discharge date: 01/19/2015  Primary Care Physician: [name], MD Primary Cardiologist: Stanford Breed Electrophysiologist: Caryl Comes  Primary Discharge Diagnosis:  Atrial flutter status post ablation this admission  Secondary Discharge Diagnosis:  1.  CAD s/p CABG 2.  COPD 3.  Hyperlipidemia 4.  Hypertension 5.  AAA 6.  Mild AS  Allergies  Allergen Reactions  . Amlodipine Nausea And Vomiting and Other (See Comments)    dizziness  . Other     STATES HE HAS NOT EATEN ANY FISH INCLUDING SHELLFISH FOR PAST 40 YRS - IT CAUSED NAUSEA  . Tylenol [Acetaminophen]     Liver problems  . Hydrocodone Itching and Rash     Procedures This Admission: 1.  Electrophysiology study and radiofrequency catheter ablation on 01/18/15 by Dr Caryl Comes.  This study demonstrated typical atrial flutter with successful CTI ablation.  There were no inducible arrhythmias following ablation and no early apparent complications.   Brief HPI: [name] is a 74 y.o. male with a past medical history as outlined above. He has documented atrial flutter and has been appropriately anticoagulated for >4 weeks.  Risks, benefits, and alternatives to ablation were reviewed with the patient who wished to proceed.   Hospital Course:  The patient was admitted and underwent EPS/RFCA with details as outlined above. He was monitored on telemetry overnight which demonstrated sinus bradycardia.  Groin incision was without complication.  They were examined by Dr Caryl Comes who considered them stable for discharge to home.  Follow up will be arranged in 4 weeks.  Wound care and restrictions were reviewed with the patient prior to discharge.   Physical Exam: Filed Vitals:   01/18/15 1800 01/18/15 1921 01/18/15 2000 01/19/15 0258  BP: 150/48 175/39 156/61 132/35  Pulse: 44 56  48 48  Temp:  98 F (36.7 C)  97.5 F (36.4 C)  TempSrc:  Oral  Oral  Resp: 19 16 15 19   Height:      Weight:    211 lb 13.8 oz (96.1 kg)  SpO2: 96% 96% 99% 98%    GEN- The patient is well appearing, alert and oriented x 3 today.   HEENT: normocephalic, atraumatic; sclera clear, conjunctiva pink; hearing intact; oropharynx clear; neck supple Lungs- Clear to ausculation bilaterally, normal work of breathing.  No wheezes, rales, rhonchi Heart- Regular rate and rhythm, no murmurs, rubs or gallops GI- soft, non-tender, non-distended, bowel sounds present Extremities- no clubbing, cyanosis, or edema; DP/PT/radial pulses 2+ bilaterally, groin without hematoma/bruit MS- no significant deformity or atrophy Skin- warm and dry, no rash or lesion Psych- euthymic mood, full affect Neuro- strength and sensation are intact   Labs:   Lab Results  Component Value Date   WBC 6.5 01/16/2015   HGB 10.3* 01/16/2015   HCT 30.7* 01/16/2015   MCV 87.7 01/16/2015   PLT 182.0 01/16/2015     Recent Labs Lab 01/19/15 0250  NA 141  K 4.8  CL 111  CO2 23  BUN 20  CREATININE 1.45*  CALCIUM 8.7*  GLUCOSE 124*    Discharge Medications:    Medication List    TAKE these medications        amLODipine 5 MG tablet  Commonly known as:  NORVASC  Take 1 tablet (5 mg total) by mouth daily.     apixaban 5 MG Tabs tablet  Commonly  known as:  ELIQUIS  Take 1 tablet (5 mg total) by mouth 2 (two) times daily.     atorvastatin 40 MG tablet  Commonly known as:  LIPITOR  Take 1 tablet (40 mg total) by mouth daily at 6 PM.     buPROPion 150 MG 24 hr tablet  Commonly known as:  WELLBUTRIN XL  TAKE 1 TABLET EVERY DAY     finasteride 5 MG tablet  Commonly known as:  PROSCAR  Take 1 tablet (5 mg total) by mouth daily. EVERY EVENING     fluticasone 50 MCG/ACT nasal spray  Commonly known as:  FLONASE  PLACE 1 SPRAY INTO BOTH NOSTRILS EVERY MORNING.     furosemide 40 MG tablet  Commonly known  as:  LASIX  Take 1 tablet (40 mg total) by mouth daily. EVERY EVENING     guaiFENesin 600 MG 12 hr tablet  Commonly known as:  MUCINEX  Take 1,200 mg by mouth 2 (two) times daily.     hydrALAZINE 25 MG tablet  Commonly known as:  APRESOLINE  Take one tablet by mouth twice daily     losartan 100 MG tablet  Commonly known as:  COZAAR  Take 1 tablet (100 mg total) by mouth daily. EVERY EVENING     nystatin ointment  Commonly known as:  MYCOSTATIN  Apply 1 application topically 2 (two) times daily.     Oxycodone HCl 20 MG Tabs  Take 1-2 tablets (20-40 mg total) by mouth every 4 (four) hours as needed.     polyethylene glycol powder powder  Commonly known as:  GLYCOLAX/MIRALAX  Take 17 g by mouth 2 (two) times daily as needed.     potassium chloride SA 20 MEQ tablet  Commonly known as:  K-DUR,KLOR-CON  Take 2 tablets (40 mEq total) by mouth daily. EVERY EVENING     tamsulosin 0.4 MG Caps capsule  Commonly known as:  FLOMAX  TAKE 1 CAPSULE (0.4 MG TOTAL) BY MOUTH DAILY AFTER SUPPER.     triamcinolone cream 0.1 %  Commonly known as:  KENALOG  Apply 1 application topically 2 (two) times daily.     VENTOLIN HFA 108 (90 BASE) MCG/ACT inhaler  Generic drug:  albuterol  INHALE 2 PUFFS INTO THE LUNGS EVERY 6 HOURS AS NEEDED FOR WHEEZING.     albuterol 108 (90 BASE) MCG/ACT inhaler  Commonly known as:  PROVENTIL HFA;VENTOLIN HFA  Inhale 2 puffs into the lungs every 6 (six) hours as needed for wheezing or shortness of breath.        Disposition:  Discharge Instructions    Diet - low sodium heart healthy    Complete by:  As directed      Increase activity slowly    Complete by:  As directed           Follow-up Information    Follow up with Patsey Berthold, NP On 02/12/2015.   Specialty:  Nurse Practitioner   Why:  at 12:10PM   Contact information:   [place] [address] [phone]       Duration of Discharge Encounter: Greater than 30 minutes  including physician time.  Signed, Chanetta Marshall, NP 01/19/2015 7:17 AM   Good response to diuretic  Will give 2 more does of IV furosemide  With post ablation there is further evidence of heart block and bradycardia  Will arrange holter in 2-3 weeks as there may be value in pacing for [id number] bradycardia

## 2015-01-23 ENCOUNTER — Telehealth: Payer: Self-pay | Admitting: Internal Medicine

## 2015-01-23 NOTE — Telephone Encounter (Signed)
Patient state when he was last in with Dr. Sharlet Salina he was told a parking placard would be completed for him.  Patient is follow up.

## 2015-01-24 NOTE — Telephone Encounter (Signed)
Patient stopped the amlodipine already. I informed him to take half a hydralazine in the morning and half at night to see if that helps with his low blood pressure and dizziness. He will try that and call us back if he is still having symptoms.

## 2015-01-24 NOTE — Telephone Encounter (Signed)
Please call and ask him to stop amlodipine and if still having symptoms to half the hydralazine and take 1/2 pill twice a day.

## 2015-01-24 NOTE — Telephone Encounter (Signed)
Left message for patient to call back to inform him of medication changes.

## 2015-01-24 NOTE — Telephone Encounter (Signed)
Spoke with patient and I will mail the form to him. Patient says his blood pressure has been running low and he has been feeling very dizzy. The diastolic pressure has been in the 40's. He wants to know if there is anything that can be done.

## 2015-01-25 ENCOUNTER — Telehealth: Payer: Self-pay | Admitting: Internal Medicine

## 2015-01-25 NOTE — Telephone Encounter (Signed)
Pt called to inform you that he received his shingle shot at the [facility] on [address].

## 2015-02-07 ENCOUNTER — Telehealth: Payer: Self-pay | Admitting: *Deleted

## 2015-02-07 NOTE — Telephone Encounter (Signed)
I would not advise that. I would advise to prop the leg up on several pillows to see if this helps with the swelling.

## 2015-02-07 NOTE — Telephone Encounter (Signed)
Notified pt with md response.../lmb 

## 2015-02-07 NOTE — Telephone Encounter (Signed)
Left msg on triage stating he is currently taking furosemide, but his (L) foot/ankle is swollen. Had an ablation done week ago. Wanting to know is it ok to double up on the furosemide until he see cardiology next Monday...[name]

## 2015-02-08 ENCOUNTER — Telehealth: Payer: Self-pay | Admitting: Cardiology

## 2015-02-08 NOTE — Telephone Encounter (Signed)
New message       Pt c/o swelling: STAT is pt has developed SOB within 24 hours  1. How long have you been experiencing swelling? For a couple of days.  Pt had an ablation on 01-18-15  2. Where is the swelling located? Ankle/leg  3.  Are you currently taking a "fluid pill"?furosemide 40mg  daily  4.  Are you currently SOB? no 5.  Have you traveled recently?no Pt has an appt with Chanetta Marshall on 02-12-15.

## 2015-02-08 NOTE — Telephone Encounter (Signed)
Pt of Dr. Stanford Breed - sees Chanetta Marshall for post-hospital f/u on 10/24 (following ablation performed by Dr. Caryl Comes)  He notes past 2-3 days his left leg has been noticeably more swollen than usual. Denies pain to touch, denies other symptoms. States leg looks "shiny". He does not have weights to report. He states he'd had fluid gain in hospital, was given larger doses of his furosemide than usual just prior to hosp discharge.   Last labs OK - creatinine was 1.45 ~2 weeks ago.  Advised if he is currently taking the 40mg  furosemide a day he could take an additional 1/2 tab (20mg ) x2-3 days, see if symptoms resolve. Call if further concerns, but o/w would be fine to follow up as scheduled on Monday.  Pt voiced understanding. Will route to [facility] for any additional considerations.

## 2015-02-08 NOTE — Telephone Encounter (Signed)
Agree  Jasmon Graffam MD, FACC   

## 2015-02-09 ENCOUNTER — Ambulatory Visit (INDEPENDENT_AMBULATORY_CARE_PROVIDER_SITE_OTHER): Payer: Medicare Other | Admitting: Internal Medicine

## 2015-02-09 ENCOUNTER — Encounter: Payer: Self-pay | Admitting: Internal Medicine

## 2015-02-09 VITALS — BP 144/60 | HR 66 | Temp 97.8°F | Resp 18 | Wt 212.0 lb

## 2015-02-09 DIAGNOSIS — G479 Sleep disorder, unspecified: Secondary | ICD-10-CM

## 2015-02-09 DIAGNOSIS — I6523 Occlusion and stenosis of bilateral carotid arteries: Secondary | ICD-10-CM | POA: Diagnosis not present

## 2015-02-09 MED ORDER — SERTRALINE HCL 25 MG PO TABS: 25.0000 mg | ORAL_TABLET | Freq: Every day | ORAL | Status: DC

## 2015-02-09 NOTE — Patient Instructions (Signed)
You were seen today for difficulty sleeping. This has been going on for a while and we both feel it is likely related to underlying anxiety.   Since you have tried  Everything else and nothing has been helpful we will go ahead and try a medication for anxiety.    Start sertraline 25 mg  Nightly.  The prescription was sent to your pharmacy. Please take as directed and call if you experience ay side effects.   You have a follow-up with  Dr.Crawford in December and can discuss this medication then, but return sooner if you have any questions/concerns.

## 2015-02-09 NOTE — Progress Notes (Signed)
Subjective:    Patient ID: [name], male    DOB: 22-Oct-1940, 74 y.o.   MRN: [id number]  HPI He is here today because of difficulty sleeping.  He has had difficulty sleeping forever, bu tit has gotten worse recently.  When he lays in bed he feels like his chest is restricted and he has difficulty breathing. If he sits up he feels better.    He does have orthopnea with his COPD and heart disease.  He is able to lay down during the day without too much difficult, but has not done it recently.  He is undergoing a lot of cardiology testing and changes in medications.  He may need a pacemaker.     Medications and allergies reviewed with patient and updated if appropriate.  Patient Active Problem List   Diagnosis Date Noted  . Atrial flutter ([place]) 11/24/2014  . Aortic stenosis 03/24/2014  . 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 Cirrhosis ([place]) 07/28/2013  . Bladder outlet obstruction 07/28/2013  . Impaired glucose metabolism   . Chronic back pain   . Benign neoplasm of colon 01/08/2011  . Abdominal aortic aneurysm ([place]) 08/12/2010  . Murmur 08/12/2010  . TOBACCO USE, QUIT 04/11/2009  . CAROTID ARTERY STENOSIS 01/08/2009  . Hyperlipidemia 11/13/2008  . ANEMIA 11/13/2008  . Essential hypertension 11/13/2008  . Coronary atherosclerosis 11/13/2008  . COPD exacerbation ([place]) 11/13/2008    Past Medical History  Diagnosis Date  . 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 COPD (chronic obstructive pulmonary disease) ([place])   . Cerebrovascular disease   . AAA (abdominal aortic aneurysm) ([place])   . 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 Hyperlipidemia   . HTN (hypertension)   . CONGENITAL UNSPEC REDUCTION DEFORMITY LOWER LIMB   . TOBACCO USE, QUIT   . CHF (congestive heart failure) ([place])   . 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 Cirrhosis ([place])     on CT a/p 07/2013, no longer drinking    . 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 AAA (abdominal aortic aneurysm) ([place])   . Carotid stenosis   . Shortness of breath     EXERTION  . Neuromuscular disorder ([place])     PT STATES HE HAS BEEN TOLD HE EITHER HAD POLIO OR CEREBRAL PALSY - STATES HIS LEFT LEG IS SHORTER AND AFFECTS HIS BALANCE - HE WEARS ELEVATED SHOE -LEFT  . Foley catheter in place   . Blindness of left eye   . Pain     BACK, LEGS AND ARMS  . Constipation due to opioid therapy 2014    began about a year ago    Past Surgical History  Procedure Laterality Date  . Coronary artery bypass graft  11/03/2008    Ricard Dillon - x4: left internal mammary artery to the distal left anterior descending, saphemous vein graft to the first circumflex marginal branch with a Y graft sequentiallly to a left posterolateral branch, saphenous vein graft to the distal right coronary artery  . Green light laser turp (transurethral resection of prostate N/A 02/14/2014    Procedure: GREEN LIGHT LASER TURP (TRANSURETHRAL RESECTION OF PROSTATE;  Surgeon: Festus Aloe, MD;  Location: [facility];  Service: Urology;  Laterality: N/A;  . Electrophysiologic study N/A 01/18/2015    Procedure: A-Flutter Ablation;  Surgeon: Deboraha Sprang, MD;  Location: [facility];  Service: Cardiovascular;  Laterality: N/A;    Social History   Social History  . Marital Status: Widowed    Spouse Name: N/A  . Number of Children: N/A  . Years of Education: N/A   Social History Main Topics  . Smoking status: Former Research scientist (life sciences)  . Smokeless tobacco: Never Used  . Alcohol Use: No     Comment: History of heavy alcohol use per pt. Quit many years ago1/1/ 2005  . Drug Use: No  . Sexual Activity: Not Asked   Other Topics Concern  . None   Social History Narrative   lives alone here in [place].  He has 1 son, who lives in the [place], and he has     an uncle, who lives nearby as well.     Review of Systems  Constitutional:  Negative for fever and chills.  Respiratory: Positive for cough (sometimes with COPD), shortness of breath (COPD) and wheezing (sometimes with COPD).   Cardiovascular: Negative for chest pain and palpitations.  Neurological: Positive for dizziness (occasional), light-headedness (occasional) and headaches (occasional).  Psychiatric/Behavioral: The patient is nervous/anxious.        Objective:   Filed Vitals:   02/09/15 1303  BP: 144/60  Pulse: 66  Temp: 97.8 F (36.6 C)  Resp: 18   Filed Weights   02/09/15 1303  Weight: 212 lb (96.163 kg)   Body mass index is 28.75 kg/(m^2).   Physical Exam  Constitutional: He is oriented to person, place, and time. He appears well-developed and well-nourished. No distress.  HENT:  Head: Normocephalic and atraumatic.  Cardiovascular: Normal rate, regular rhythm and normal heart sounds.   Pulmonary/Chest: Effort normal and breath sounds normal. No respiratory distress.  Musculoskeletal: He exhibits edema (2 + b/l LE ).  Neurological: He is alert and oriented to person, place, and time.  Skin: He is not diaphoretic.  Psychiatric: He has a normal mood and affect. His behavior is normal. Judgment and thought content normal.          Assessment & Plan:   Sleep difficulties,  Anxiety We discussed his sleep difficulties in detail and his difficulty going to sleep is likely related to anxiety. He states he may also have generalized anxiety  He states he has tried everything and nothing has helped  He does want to consider medication-he wondered about Xanax Discussed concerns with xanax - risk especially for the elderly We need to address his anxiety,not his sleep Will try low dose sertraline 25mg  daily at night Discussed  Possible side effects -advised him to call if he experiences any Follow-up in approximately 6 weeks with Dr Sharlet Salina to review this medication,  but advised him to come in sooner if he has any questions or concerns

## 2015-02-09 NOTE — Progress Notes (Signed)
Pre visit review using our clinic review tool, if applicable. No additional management support is needed unless otherwise documented below in the visit note. 

## 2015-02-12 ENCOUNTER — Telehealth: Payer: Self-pay | Admitting: Internal Medicine

## 2015-02-12 ENCOUNTER — Encounter: Payer: Medicare Other | Admitting: Nurse Practitioner

## 2015-02-12 DIAGNOSIS — R600 Localized edema: Secondary | ICD-10-CM

## 2015-02-12 DIAGNOSIS — R001 Bradycardia, unspecified: Secondary | ICD-10-CM

## 2015-02-12 NOTE — Telephone Encounter (Signed)
Please have him come in for BMET as well as 48 hour holter monitor per Dr Olin Pia addition to my DC summary to evaluate for bradycardia.  Otherwise, leave meds the same for now.  Call back if weight increases or increased shortness of breath.  Chanetta Marshall, NP 02/12/2015 12:57 PM

## 2015-02-12 NOTE — Telephone Encounter (Signed)
I spoke with the patient. He states he was supposed to see Amber today for follow up, but had to cancel as he has let a family member borrow his car and they overslept and couldn't get him here on time. He only has about 5 tablets of Elquis left and was questioning if he should remain on this, which I confirmed he should, as well as stay on his increased dose of lasix 60 mg daily as per his discussion with Dr. Jacalyn Lefevre office for lower extremity swelling last week. He states that his weight is stable. His urination is down a little bit. I advised I would forward to Amber to review and see what he should do about his lasix since he was given IV lasix in the hospital during his ablation. He is not able to see her until 11/3. I advised I will also inquire if he needs follow up labs in the interim. I will call him back with recommendations. He is agreeable.

## 2015-02-12 NOTE — Telephone Encounter (Signed)
New message  Pt was to have [facility] w/ Amber today and canceled due to transportation- pt was to have status of eliquis and flurosemide checked at [facility] today- pt has eph sched for 11/3. Please call back and discuss.

## 2015-02-12 NOTE — Telephone Encounter (Signed)
I called the patient and notified him I pulled Eliquis samples for him, but they 2.5 mg tablets, so he will need to take two tablets BID. He verbalizes understanding. He is coming for his labs and monitor tomorrow.

## 2015-02-12 NOTE — Telephone Encounter (Signed)
I spoke with the patient and he is agreeable with Amber's recommendations. He would like to come for his monitor tomorrow. I advised I will place the order and see if that can be arranged, and if so, I will pull Eliquis samples for him if available and he can come for labwork.

## 2015-02-13 ENCOUNTER — Other Ambulatory Visit (INDEPENDENT_AMBULATORY_CARE_PROVIDER_SITE_OTHER): Payer: Medicare Other | Admitting: *Deleted

## 2015-02-13 ENCOUNTER — Ambulatory Visit (INDEPENDENT_AMBULATORY_CARE_PROVIDER_SITE_OTHER): Payer: Medicare Other

## 2015-02-13 DIAGNOSIS — R609 Edema, unspecified: Secondary | ICD-10-CM | POA: Diagnosis not present

## 2015-02-13 DIAGNOSIS — R600 Localized edema: Secondary | ICD-10-CM

## 2015-02-13 DIAGNOSIS — R001 Bradycardia, unspecified: Secondary | ICD-10-CM

## 2015-02-13 LAB — BASIC METABOLIC PANEL
BUN: 28 mg/dL — ABNORMAL HIGH (ref 7–25)
CO2: 23 mmol/L (ref 20–31)
Calcium: 9 mg/dL (ref 8.6–10.3)
Chloride: 105 mmol/L (ref 98–110)
Creat: 1.37 mg/dL — ABNORMAL HIGH (ref 0.70–1.18)
Glucose, Bld: 71 mg/dL (ref 65–99)
Potassium: 4.1 mmol/L (ref 3.5–5.3)
Sodium: 138 mmol/L (ref 135–146)

## 2015-02-14 ENCOUNTER — Telehealth: Payer: Self-pay | Admitting: *Deleted

## 2015-02-14 MED ORDER — OXYCODONE HCL 20 MG PO TABS: 20.0000 mg | ORAL_TABLET | ORAL | Status: DC | PRN

## 2015-02-14 NOTE — Telephone Encounter (Signed)
Receive call pt requesting refills on his oxycodone...[name]

## 2015-02-14 NOTE — Telephone Encounter (Signed)
Printed and signed.  

## 2015-02-14 NOTE — Telephone Encounter (Signed)
Notified pt rx ready for pick-up. Place in cabinet.../lmb 

## 2015-02-20 NOTE — Progress Notes (Signed)
HPI: FU CAD; s/p NSTEMI followed by CABG 10/2008 (L-LAD, S-OM1/L PL br, S-RCA), AAA, carotid stenosis. Beta blocker previously held due to Mobitz Type 1. Had atrial flutter ablation 9/16. Holter monitor ordered at DC from recent ablation. Since last seen,  Studies:  - LHC (09/2008): 3 v CAD, EF 65% => CABG  - Echo (8/16): Normal LV function, mild aortic stenosis with a mean gradient of 12 mmHg and mild left atrial enlargement.  - Carotid US (05/5051): RICA 9-76%; LICA 73-41% - f/u 2 years  - Abdominal ultrasound 8/16 showed abdominal aortic aneurysm 2.6 x 2.6.   Current Outpatient Prescriptions  Medication Sig Dispense Refill  . apixaban (ELIQUIS) 5 MG TABS tablet Take 1 tablet (5 mg total) by mouth 2 (two) times daily. 60 tablet 6  . atorvastatin (LIPITOR) 40 MG tablet Take 1 tablet (40 mg total) by mouth daily at 6 PM. 90 tablet 3  . finasteride (PROSCAR) 5 MG tablet Take 1 tablet (5 mg total) by mouth daily. EVERY EVENING 90 tablet 3  . fluticasone (FLONASE) 50 MCG/ACT nasal spray PLACE 1 SPRAY INTO BOTH NOSTRILS EVERY MORNING. 16 g 3  . furosemide (LASIX) 40 MG tablet Take 1 tablet (40 mg total) by mouth daily. EVERY EVENING 90 tablet 2  . guaiFENesin (MUCINEX) 600 MG 12 hr tablet Take 1,200 mg by mouth 2 (two) times daily.      . hydrALAZINE (APRESOLINE) 25 MG tablet Take one tablet by mouth twice daily (Patient taking differently: Take 25 mg by mouth 2 (two) times daily. Taking 1/2 tablet twice daily.) 180 tablet 3  . losartan (COZAAR) 100 MG tablet Take 1 tablet (100 mg total) by mouth daily. EVERY EVENING 90 tablet 3  . nystatin ointment (MYCOSTATIN) Apply 1 application topically 2 (two) times daily.    . Oxycodone HCl 20 MG TABS Take 1-2 tablets (20-40 mg total) by mouth every 4 (four) hours as needed. 180 each 0  . polyethylene glycol powder (GLYCOLAX/MIRALAX) powder Take 17 g by mouth 2 (two) times daily as needed. 850 g 1  . potassium chloride SA (K-DUR,KLOR-CON) 20 MEQ  tablet Take 2 tablets (40 mEq total) by mouth daily. EVERY EVENING 180 tablet 2  . sertraline (ZOLOFT) 25 MG tablet Take 1 tablet (25 mg total) by mouth at bedtime. 30 tablet 5  . tamsulosin (FLOMAX) 0.4 MG CAPS capsule TAKE 1 CAPSULE (0.4 MG TOTAL) BY MOUTH DAILY AFTER SUPPER. 90 capsule 3  . triamcinolone cream (KENALOG) 0.1 % Apply 1 application topically 2 (two) times daily.    . VENTOLIN HFA 108 (90 BASE) MCG/ACT inhaler INHALE 2 PUFFS INTO THE LUNGS EVERY 6 HOURS AS NEEDED FOR WHEEZING. 3 Inhaler 2   No current facility-administered medications for this visit.     Past Medical History  Diagnosis Date  . 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 COPD (chronic obstructive pulmonary disease) ([place])   . Cerebrovascular disease   . AAA (abdominal aortic aneurysm) ([place])   . 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 Hyperlipidemia   . HTN (hypertension)   . CONGENITAL UNSPEC REDUCTION DEFORMITY LOWER LIMB   . TOBACCO USE, QUIT   . CHF (congestive heart failure) ([place])   . 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 Cirrhosis ([place])     on CT a/p 07/2013, no longer drinking  . 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  AAA (abdominal aortic aneurysm) ([place])   . Carotid stenosis   . Shortness of breath     EXERTION  . Neuromuscular disorder ([place])     PT STATES HE HAS BEEN TOLD HE EITHER HAD POLIO OR CEREBRAL PALSY - STATES HIS LEFT LEG IS SHORTER AND AFFECTS HIS BALANCE - HE WEARS ELEVATED SHOE -LEFT  . Foley catheter in place   . Blindness of left eye   . Pain     BACK, LEGS AND ARMS  . Constipation due to opioid therapy 2014    began about a year ago    Past Surgical History  Procedure Laterality Date  . Coronary artery bypass graft  11/03/2008    Ricard Dillon - x4: left internal mammary artery to the distal left anterior descending, saphemous vein graft to the first circumflex marginal branch with a Y graft  sequentiallly to a left posterolateral branch, saphenous vein graft to the distal right coronary artery  . Green light laser turp (transurethral resection of prostate N/A 02/14/2014    Procedure: GREEN LIGHT LASER TURP (TRANSURETHRAL RESECTION OF PROSTATE;  Surgeon: Festus Aloe, MD;  Location: [facility];  Service: Urology;  Laterality: N/A;  . Electrophysiologic study N/A 01/18/2015    Procedure: A-Flutter Ablation;  Surgeon: Deboraha Sprang, MD;  Location: [facility];  Service: Cardiovascular;  Laterality: N/A;    Social History   Social History  . Marital Status: Widowed    Spouse Name: N/A  . Number of Children: N/A  . Years of Education: N/A   Occupational History  . Not on file.   Social History Main Topics  . Smoking status: Former Research scientist (life sciences)  . Smokeless tobacco: Never Used  . Alcohol Use: No     Comment: History of heavy alcohol use per pt. Quit many years ago1/1/ 2005  . Drug Use: No  . Sexual Activity: Not on file   Other Topics Concern  . Not on file   Social History Narrative   lives alone here in [place].  He has 1 son, who lives in the [place], and he has     an uncle, who lives nearby as well.     ROS: no fevers or chills, productive cough, hemoptysis, dysphasia, odynophagia, melena, hematochezia, dysuria, hematuria, rash, seizure activity, orthopnea, PND, pedal edema, claudication. Remaining systems are negative.  Physical Exam: Well-developed well-nourished in no acute distress.  Skin is warm and dry.  HEENT is normal.  Neck is supple.  Chest is clear to auscultation with normal expansion.  Cardiovascular exam is regular rate and rhythm.  Abdominal exam nontender or distended. No masses palpated. Extremities show no edema. neuro grossly intact  ECG     This encounter was created in error - please disregard.

## 2015-02-22 ENCOUNTER — Ambulatory Visit (INDEPENDENT_AMBULATORY_CARE_PROVIDER_SITE_OTHER): Payer: Medicare Other | Admitting: Internal Medicine

## 2015-02-22 ENCOUNTER — Encounter: Payer: Self-pay | Admitting: Internal Medicine

## 2015-02-22 VITALS — BP 118/56 | HR 71 | Ht 73.0 in | Wt 208.0 lb

## 2015-02-22 DIAGNOSIS — I350 Nonrheumatic aortic (valve) stenosis: Secondary | ICD-10-CM | POA: Diagnosis not present

## 2015-02-22 DIAGNOSIS — I6523 Occlusion and stenosis of bilateral carotid arteries: Secondary | ICD-10-CM

## 2015-02-22 DIAGNOSIS — I4892 Unspecified atrial flutter: Secondary | ICD-10-CM | POA: Diagnosis not present

## 2015-02-22 NOTE — Progress Notes (Signed)
Patient Care Team: [name], MD as PCP - General (Internal Medicine) Lelon Perla, MD as Consulting Physician (Cardiology) Rexene Alberts, MD as Consulting Physician (Cardiothoracic Surgery) Rolan Bucco, MD (Urology)   HPI  [name] is a 74 y.o. male Seen following catheter ablation for atrial flutter. He has had no recurrent arrhythmia. He was noted post ablation to have significant sinus bradycardia. We undertook a Holter monitor. It showed significant pausing and heart block the 8:00 in the morning range. This turns out to be the middle of his sleeping night.  He denies chest pain or shortness of breath apart from his baseline. He does have mild peripheral edema which prompted his PCP to increase his furosemide 40--60 with a mild improvement  Creatinine has ranged in the last month from 1.63---1.37 most recently 10/25.  Echo 8/15 EF 55-65%  Records and Results Reviewed outside labs  Past Medical History  Diagnosis Date  . 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 COPD (chronic obstructive pulmonary disease) ([place])   . Cerebrovascular disease   . AAA (abdominal aortic aneurysm) ([place])   . 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 Hyperlipidemia   . HTN (hypertension)   . CONGENITAL UNSPEC REDUCTION DEFORMITY LOWER LIMB   . TOBACCO USE, QUIT   . CHF (congestive heart failure) ([place])   . 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 Cirrhosis ([place])     on CT a/p 07/2013, no longer drinking  . 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 AAA (abdominal aortic aneurysm) ([place])   . Carotid stenosis   . Shortness of breath     EXERTION  . Neuromuscular disorder ([place])     PT STATES HE HAS BEEN TOLD HE EITHER HAD POLIO OR CEREBRAL PALSY - STATES HIS LEFT LEG IS SHORTER AND AFFECTS HIS BALANCE - HE WEARS ELEVATED SHOE -LEFT  . Foley catheter in place   . Blindness of left eye   .  Pain     BACK, LEGS AND ARMS  . Constipation due to opioid therapy 2014    began about a year ago    Past Surgical History  Procedure Laterality Date  . Coronary artery bypass graft  11/03/2008    Ricard Dillon - x4: left internal mammary artery to the distal left anterior descending, saphemous vein graft to the first circumflex marginal branch with a Y graft sequentiallly to a left posterolateral branch, saphenous vein graft to the distal right coronary artery  . Green light laser turp (transurethral resection of prostate N/A 02/14/2014    Procedure: GREEN LIGHT LASER TURP (TRANSURETHRAL RESECTION OF PROSTATE;  Surgeon: Festus Aloe, MD;  Location: [facility];  Service: Urology;  Laterality: N/A;  . Electrophysiologic study N/A 01/18/2015    Procedure: A-Flutter Ablation;  Surgeon: Deboraha Sprang, MD;  Location: [facility];  Service: Cardiovascular;  Laterality: N/A;    Current Outpatient Prescriptions  Medication Sig Dispense Refill  . apixaban (ELIQUIS) 5 MG TABS tablet Take 1 tablet (5 mg total) by mouth 2 (two) times daily. 60 tablet 6  . atorvastatin (LIPITOR) 40 MG tablet Take 1 tablet (40 mg total) by mouth daily at 6 PM. 90 tablet 3  . buPROPion (WELLBUTRIN XL) 150 MG 24 hr tablet Take 150 mg by mouth daily.  3  . finasteride (PROSCAR) 5 MG tablet Take 1 tablet (5 mg  total) by mouth daily. EVERY EVENING 90 tablet 3  . fluticasone (FLONASE) 50 MCG/ACT nasal spray PLACE 1 SPRAY INTO BOTH NOSTRILS EVERY MORNING. 16 g 3  . furosemide (LASIX) 40 MG tablet Take 1 tablet (40 mg total) by mouth daily. EVERY EVENING 90 tablet 2  . guaiFENesin (MUCINEX) 600 MG 12 hr tablet Take 1,200 mg by mouth 2 (two) times daily.      . hydrALAZINE (APRESOLINE) 25 MG tablet Take 12.5 mg by mouth 2 (two) times daily.    [name] KLOR-CON M20 20 MEQ tablet Take 20 mEq by mouth daily. Only when taking lasix    . losartan (COZAAR) 100 MG tablet Take 1 tablet (100 mg total) by mouth daily. EVERY EVENING 90 tablet 3    . nystatin ointment (MYCOSTATIN) Apply 1 application topically 2 (two) times daily.    . Oxycodone HCl 20 MG TABS Take 1-2 tablets (20-40 mg total) by mouth every 4 (four) hours as needed. 180 each 0  . polyethylene glycol powder (GLYCOLAX/MIRALAX) powder Take 17 g by mouth 2 (two) times daily as needed. 850 g 1  . sertraline (ZOLOFT) 25 MG tablet Take 1 tablet (25 mg total) by mouth at bedtime. 30 tablet 5  . tamsulosin (FLOMAX) 0.4 MG CAPS capsule TAKE 1 CAPSULE (0.4 MG TOTAL) BY MOUTH DAILY AFTER SUPPER. 90 capsule 3  . triamcinolone cream (KENALOG) 0.1 % Apply 1 application topically 2 (two) times daily.    . VENTOLIN HFA 108 (90 BASE) MCG/ACT inhaler INHALE 2 PUFFS INTO THE LUNGS EVERY 6 HOURS AS NEEDED FOR WHEEZING. 3 Inhaler 2   No current facility-administered medications for this visit.    Allergies  Allergen Reactions  . Amlodipine Nausea And Vomiting and Other (See Comments)    dizziness  . Other     STATES HE HAS NOT EATEN ANY FISH INCLUDING SHELLFISH FOR PAST 40 YRS - IT CAUSED NAUSEA  . Tylenol [Acetaminophen]     Liver problems  . Hydrocodone Itching and Rash      Review of Systems negative except from HPI and PMH  Physical Exam BP 118/56 mmHg  Pulse 71  Ht 6\' 1"  (1.854 m)  Wt 208 lb (94.348 kg)  BMI 27.45 kg/m2 Well developed and well nourished in no acute distress HENT normal E scleral and icterus clear Neck Supple JVP8; carotids brisk and full Clear to ausculation  Regular rate and rhythm, no murmurs gallops or rub Soft with active bowel sounds No clubbing cyanosis 1+ Edema Alert and oriented, grossly normal motor and sensory function Skin Warm and Dry  ECG demonstrates sinus rhythm at 71 Intervals 29/09/40 axis normal at 50  Assessment and  Plan  Atrial flutter status post ablation  HFpEF  Nocturnal bradycardia and heart block  Renal insufficiency  Ischemic heart disease with prior bypass surgery  We will stop his apixaban at this  point  We will resume aspirin 81 mg.  His bradycardia was nocturnal; hence, no intervention is necessary.  With his volume overload, we will increase his furosemide gently 60--805 days and then resume it at 60 mg a day. We will check his metabolic profile in 2 weeks time.  He was to see Dr. London Sheer tomorrow; we will change that appointment to make a 3 months from now.

## 2015-02-22 NOTE — Patient Instructions (Signed)
Medication Instructions: 1) Increase lasix (furosemide) to 80 mg once daily x 5 days, then resume 60 mg once daily. 2) Stop Eliquis 3) Re-start Aspirin 81 mg once daily  Labwork: - Your physician recommends that you return for lab work in: 2 weeks- BMP  Procedures/Testing: - none  Follow-Up: - We will cancel your follow up with Dr. Stanford Breed scheduled this week.  - Your physician recommends that you schedule a follow-up appointment in: 3 months with Dr. Stanford Breed.  - Dr. Caryl Comes will see you back on an as needed basis.  Any Additional Special Instructions Will Be Listed Below (If Applicable). - none

## 2015-02-23 ENCOUNTER — Encounter: Payer: Medicare Other | Admitting: Cardiology

## 2015-02-23 ENCOUNTER — Ambulatory Visit: Payer: Medicare Other | Admitting: Internal Medicine

## 2015-02-28 ENCOUNTER — Telehealth: Payer: Self-pay | Admitting: Internal Medicine

## 2015-02-28 NOTE — Telephone Encounter (Signed)
I spoke with [name] in our lab- we cannot send patient's to the [facility] lab any longer. I called the patient and advised him of this. I explained he wanted his labs drawn at [facility], he could check with his PCP to see if they could order the labs Dr. Caryl Comes was wanting. He states he will just come to our lab.

## 2015-02-28 NOTE — Telephone Encounter (Signed)
New Message  Pt requested so have labs drawn @ [facility]- PCP- pt hs difficulty going from our office' parking lot to building and has easier time @ [facility]. Please call back and discuss.

## 2015-03-01 ENCOUNTER — Telehealth: Payer: Self-pay | Admitting: Internal Medicine

## 2015-03-01 MED ORDER — CYCLOBENZAPRINE HCL 10 MG PO TABS: 10.0000 mg | ORAL_TABLET | Freq: Three times a day (TID) | ORAL | Status: DC | PRN

## 2015-03-01 NOTE — Telephone Encounter (Signed)
Would recommend to start spacing out the pain medication. We cannot replace lost medications due to our controlled substance policy and was just filled on 02/14/15. If possible to look for the medicine would recommend to do that. Can call in a muscle relaxer to the pharmacy to try to help in the meantime. Flexeril can be taken up to 3 times a day.

## 2015-03-01 NOTE — Telephone Encounter (Signed)
Pt said that he lost his pain meds, he will be out Monday. He is in so much pain and not sure what he is going to do without it.  Any suggestions??  Best number [phone]

## 2015-03-02 NOTE — Telephone Encounter (Signed)
Patient is aware and has scheduled an appointment for Monday to discuss.

## 2015-03-05 ENCOUNTER — Telehealth: Payer: Self-pay | Admitting: *Deleted

## 2015-03-05 ENCOUNTER — Ambulatory Visit (INDEPENDENT_AMBULATORY_CARE_PROVIDER_SITE_OTHER): Payer: Medicare Other | Admitting: Internal Medicine

## 2015-03-05 ENCOUNTER — Encounter: Payer: Self-pay | Admitting: Internal Medicine

## 2015-03-05 ENCOUNTER — Other Ambulatory Visit (INDEPENDENT_AMBULATORY_CARE_PROVIDER_SITE_OTHER): Payer: Medicare Other

## 2015-03-05 VITALS — BP 132/58 | HR 75 | Temp 98.3°F | Resp 14 | Ht 71.0 in | Wt 210.0 lb

## 2015-03-05 DIAGNOSIS — I10 Essential (primary) hypertension: Secondary | ICD-10-CM

## 2015-03-05 DIAGNOSIS — I6523 Occlusion and stenosis of bilateral carotid arteries: Secondary | ICD-10-CM

## 2015-03-05 DIAGNOSIS — R21 Rash and other nonspecific skin eruption: Secondary | ICD-10-CM | POA: Diagnosis not present

## 2015-03-05 DIAGNOSIS — G8929 Other chronic pain: Secondary | ICD-10-CM

## 2015-03-05 DIAGNOSIS — M549 Dorsalgia, unspecified: Secondary | ICD-10-CM

## 2015-03-05 LAB — BASIC METABOLIC PANEL
BUN: 39 mg/dL — ABNORMAL HIGH (ref 6–23)
CO2: 26 mEq/L (ref 19–32)
Calcium: 9.3 mg/dL (ref 8.4–10.5)
Chloride: 107 mEq/L (ref 96–112)
Creatinine, Ser: 1.76 mg/dL — ABNORMAL HIGH (ref 0.40–1.50)
GFR: 40.62 mL/min — ABNORMAL LOW (ref >60.00)
Glucose, Bld: 100 mg/dL — ABNORMAL HIGH (ref 70–99)
Potassium: 4.7 mEq/L (ref 3.5–5.1)
Sodium: 140 mEq/L (ref 135–145)

## 2015-03-05 MED ORDER — OXYCODONE HCL 20 MG PO TABS: 20.0000 mg | ORAL_TABLET | ORAL | Status: DC | PRN

## 2015-03-05 MED ORDER — NYSTATIN 100000 UNIT/GM EX OINT: 1.0000 "application " | TOPICAL_OINTMENT | Freq: Two times a day (BID) | CUTANEOUS | Status: DC

## 2015-03-05 MED ORDER — TRIAMCINOLONE ACETONIDE 0.1 % EX CREA: 1.0000 "application " | TOPICAL_CREAM | Freq: Two times a day (BID) | CUTANEOUS | Status: DC

## 2015-03-05 NOTE — Telephone Encounter (Signed)
Patient has also called about this prescription because he is going to be needing to get fill today. He stated that the pharmacy is [facility]

## 2015-03-05 NOTE — Assessment & Plan Note (Signed)
Okay with refill for 2 week supply #90 no refills today. Did discuss with him again the pain contract and that generally we do not replace pain medication as they are like cash and cannot be replaced. If this happens again red flag and will need to re-evaluate contract. No change in pain symptoms. He does understand that this early refill is a one time only.

## 2015-03-05 NOTE — Telephone Encounter (Signed)
See note from today. Ok to fill today.

## 2015-03-05 NOTE — Telephone Encounter (Signed)
Pharmacist left msg on triage stating Pt got oxycodone filled 10/26, and md stated to last for 30 days. Pt brought another rx dated today for Oxycodone take 1-2 every 4 hours as needed, and notate this is a 30 day. Wanting to know does md want rx fill today. If pt takes the medication like the instructions this amount will not be for 30 day...[name]

## 2015-03-05 NOTE — Patient Instructions (Signed)
We have given you the 2 week supply of the pain medicine to tide you over.   We have refilled the two medicines for the rash, it should be gone in 1-2 weeks.   It is okay to take tylenol but you should not take more than 1000 mg (2 pills of tylenol) per day.   Head down to the lab and we will forward it to the heart doctor.

## 2015-03-05 NOTE — Progress Notes (Signed)
   Subjective:    Patient ID: [name], male    DOB: Feb 22, 1941, 74 y.o.   MRN: [id number]  HPI The patient is a 74 YO man coming in because he lost his pain medicine and was denied early refill. He is not sure where it went or why it is lost. His daughter comes over once per week. He does keep some out of the bottle for about 1 week at a time so did not notice when it was lost. Was given rx for flexeril to help until refill but this was too expensive and he was unable to fill.  He does also have a rash on his scrotum that he has had in the past. He was given yeast cream by urologist in the past but is out of that medicine. Wants to know if he can get refill. Slightly itchy, no skin breakdown or drainage. Some spots also on his groin folds.   Review of Systems  Constitutional: Positive for activity change and fatigue. Negative for fever, appetite change and unexpected weight change.  HENT: Negative.   Respiratory: Negative for chest tightness and shortness of breath.   Cardiovascular: Negative for chest pain, palpitations and leg swelling.  Gastrointestinal: Negative for abdominal pain, constipation and abdominal distention.  Musculoskeletal: Positive for myalgias, back pain and arthralgias.  Skin: Positive for rash.  Neurological: Positive for weakness. Negative for dizziness, syncope and light-headedness.      Objective:   Physical Exam  Constitutional: He appears well-developed and well-nourished.  Overweight  HENT:  Head: Normocephalic and atraumatic.  Eyes: EOM are normal.  Neck: Normal range of motion.  Cardiovascular: Normal rate and regular rhythm.   Pulmonary/Chest: Effort normal. No respiratory distress. He has no wheezes. He has no rales. He exhibits no tenderness.  Abdominal: Soft.  Neurological: Coordination normal.  Skin: Skin is warm and dry.  Deferred scrotal exam for rash   Filed Vitals:   03/05/15 1004  BP: 132/58  Pulse: 75  Temp: 98.3 F (36.8 C)    TempSrc: Oral  Resp: 14  Height: 5\' 11"  (1.803 m)  Weight: 210 lb (95.255 kg)  SpO2: 98%      Assessment & Plan:

## 2015-03-05 NOTE — Progress Notes (Signed)
Pre visit review using our clinic review tool, if applicable. No additional management support is needed unless otherwise documented below in the visit note. 

## 2015-03-05 NOTE — Assessment & Plan Note (Signed)
Did defer exam today due to patient request. Will refill nystatin and if no resolution in 1-2 weeks needs exam and he agrees.

## 2015-03-08 ENCOUNTER — Other Ambulatory Visit: Payer: Medicare Other

## 2015-03-08 ENCOUNTER — Telehealth: Payer: Self-pay | Admitting: Cardiology

## 2015-03-08 ENCOUNTER — Telehealth: Payer: Self-pay | Admitting: Internal Medicine

## 2015-03-08 DIAGNOSIS — N289 Disorder of kidney and ureter, unspecified: Secondary | ICD-10-CM

## 2015-03-08 NOTE — Telephone Encounter (Signed)
Spoke with patient and he is going to take his medication list to the pharmacy and ask the pharmacist what medication is best to take for his head cold.

## 2015-03-08 NOTE — Telephone Encounter (Signed)
Pt had lab work done on Monday at Dr Sharlet Salina at [facility], He wanted to make sure you get the results.

## 2015-03-08 NOTE — Telephone Encounter (Signed)
Lab forwarded to dr Stanford Breed for review.

## 2015-03-08 NOTE — Telephone Encounter (Signed)
Patient states that he has a head cold. He is concerned over which OTC meds that he can take, as he fears adverse reactions with current meds. He is requesting advice on this. Please give him a call.

## 2015-03-12 NOTE — Telephone Encounter (Signed)
Repeat bmet 4 weeks    [place] with pt, Aware of dr Jacalyn Lefevre recommendations.

## 2015-03-13 ENCOUNTER — Telehealth: Payer: Self-pay | Admitting: Internal Medicine

## 2015-03-13 MED ORDER — OXYCODONE HCL 20 MG PO TABS: 20.0000 mg | ORAL_TABLET | ORAL | Status: DC | PRN

## 2015-03-13 NOTE — Telephone Encounter (Signed)
Called and left patient a voice mail informing him he could come pick up the prescription.

## 2015-03-13 NOTE — Telephone Encounter (Signed)
Pt requesting refill for Oxycodone HCl 20 MG TABS [id number]. He states it is due on the 25th and would like it early due to the holiday. However, I did let him know the last refill he had was written for a 30 day supply. He states there was a letter with it stating this is only for a 2 week supply till his regular refill is due.  Can you please give him a call

## 2015-03-13 NOTE — Telephone Encounter (Signed)
Printed and signed.  

## 2015-03-23 ENCOUNTER — Telehealth: Payer: Self-pay

## 2015-03-23 NOTE — Telephone Encounter (Signed)
Patient is not on Ranexa. Only Eliquis samples in bag, never picked up.

## 2015-03-23 NOTE — Telephone Encounter (Signed)
Patient never picked up samples of Ranexa and Eliquis, so they were placed back in stock.

## 2015-03-27 ENCOUNTER — Other Ambulatory Visit: Payer: Self-pay | Admitting: Internal Medicine

## 2015-04-05 ENCOUNTER — Telehealth: Payer: Self-pay | Admitting: Cardiology

## 2015-04-05 NOTE — Telephone Encounter (Signed)
New Message  Pt has appt with his PCP on 04/07/15   he wants the order for the blood work to be sent  Dr. [name] at [facility] on [address] at 4:15pm

## 2015-04-05 NOTE — Telephone Encounter (Signed)
BMET ordered, viewable in epic.

## 2015-04-09 ENCOUNTER — Ambulatory Visit (INDEPENDENT_AMBULATORY_CARE_PROVIDER_SITE_OTHER): Payer: Medicare Other | Admitting: Internal Medicine

## 2015-04-09 ENCOUNTER — Other Ambulatory Visit: Payer: Medicare Other

## 2015-04-09 ENCOUNTER — Encounter: Payer: Self-pay | Admitting: Internal Medicine

## 2015-04-09 ENCOUNTER — Other Ambulatory Visit: Payer: Self-pay | Admitting: Internal Medicine

## 2015-04-09 VITALS — BP 140/60 | HR 72 | Temp 97.9°F | Resp 20 | Ht 72.0 in | Wt 208.1 lb

## 2015-04-09 DIAGNOSIS — I6523 Occlusion and stenosis of bilateral carotid arteries: Secondary | ICD-10-CM | POA: Diagnosis not present

## 2015-04-09 DIAGNOSIS — I10 Essential (primary) hypertension: Secondary | ICD-10-CM

## 2015-04-09 DIAGNOSIS — R0600 Dyspnea, unspecified: Secondary | ICD-10-CM

## 2015-04-09 DIAGNOSIS — N289 Disorder of kidney and ureter, unspecified: Secondary | ICD-10-CM

## 2015-04-09 LAB — BASIC METABOLIC PANEL
BUN: 41 mg/dL — ABNORMAL HIGH (ref 7–25)
CO2: 19 mmol/L — ABNORMAL LOW (ref 20–31)
Calcium: 8.7 mg/dL (ref 8.6–10.3)
Chloride: 109 mmol/L (ref 98–110)
Creat: 1.72 mg/dL — ABNORMAL HIGH (ref 0.70–1.18)
Glucose, Bld: 116 mg/dL — ABNORMAL HIGH (ref 65–99)
Potassium: 5 mmol/L (ref 3.5–5.3)
Sodium: 138 mmol/L (ref 135–146)

## 2015-04-09 MED ORDER — OXYCODONE HCL 20 MG PO TABS: 20.0000 mg | ORAL_TABLET | ORAL | Status: DC | PRN

## 2015-04-09 NOTE — Progress Notes (Signed)
Pre visit review using our clinic review tool, if applicable. No additional management support is needed unless otherwise documented below in the visit note. 

## 2015-04-09 NOTE — Patient Instructions (Signed)
We will check the blood work today and let you know if you can increase the lasix to 2 pills a day.   We have filled the pain medicine so you can travel.   Come back in 3-4 months.

## 2015-04-10 DIAGNOSIS — R0600 Dyspnea, unspecified: Secondary | ICD-10-CM | POA: Insufficient documentation

## 2015-04-10 NOTE — Progress Notes (Signed)
   Subjective:    Patient ID: [name], male    DOB: Dec 19, 1940, 74 y.o.   MRN: [id number]  HPI The patient is a 74 YO man coming in for follow up of his blood pressure and his chronic pain. He is also having some more trouble with SOB. He has a trailer and is not able to walk from one end to the other without getting out of breath. Also some problems with not being able to breath well at night time when lying flat. Taking his medicines as prescribed. Has increased his lasix to 1.5 pills per day as instructed by cardiology and has not noticed much difference.   Review of Systems  Constitutional: Positive for activity change and fatigue. Negative for fever, appetite change and unexpected weight change.  HENT: Negative.   Respiratory: Positive for shortness of breath. Negative for chest tightness.   Cardiovascular: Negative for chest pain, palpitations and leg swelling.  Gastrointestinal: Negative for abdominal pain, constipation and abdominal distention.  Musculoskeletal: Positive for back pain and arthralgias.  Neurological: Positive for weakness. Negative for dizziness, syncope and light-headedness.      Objective:   Physical Exam  Constitutional: He is oriented to person, place, and time. He appears well-developed and well-nourished.  Overweight  HENT:  Head: Normocephalic and atraumatic.  Eyes: EOM are normal.  Neck: Normal range of motion.  Cardiovascular: Normal rate and regular rhythm.   Pulmonary/Chest: Effort normal and breath sounds normal. No respiratory distress. He has no wheezes. He has no rales.  Abdominal: Soft. Bowel sounds are normal. He exhibits no distension. There is no tenderness. There is no rebound.  Musculoskeletal: He exhibits no edema.  Ankles without edema  Neurological: He is alert and oriented to person, place, and time. Coordination normal.  Skin: Skin is warm and dry.   Filed Vitals:   04/09/15 1614  BP: 140/60  Pulse: 72  Temp: 97.9 F (36.6  C)  TempSrc: Oral  Resp: 20  Height: 6' (1.829 m)  Weight: 208 lb 1.9 oz (94.403 kg)  SpO2: 99%      Assessment & Plan:

## 2015-04-10 NOTE — Assessment & Plan Note (Signed)
BP controlled on lasix, hydralazine, losartan. Checking labs today and adjust as needed.

## 2015-04-10 NOTE — Assessment & Plan Note (Addendum)
Checking BNP today to see if he is significantly volume overloaded to cause his breathing problems. Does not appear to have significant volume on exam today.

## 2015-04-13 ENCOUNTER — Other Ambulatory Visit: Payer: Self-pay | Admitting: *Deleted

## 2015-04-13 MED ORDER — FUROSEMIDE 40 MG PO TABS: 40.0000 mg | ORAL_TABLET | Freq: Every day | ORAL | Status: DC

## 2015-04-30 ENCOUNTER — Other Ambulatory Visit: Payer: Self-pay | Admitting: Cardiology

## 2015-04-30 NOTE — Telephone Encounter (Signed)
Rx request sent to pharmacy.  

## 2015-05-04 ENCOUNTER — Telehealth: Payer: Self-pay | Admitting: Cardiology

## 2015-05-04 NOTE — Telephone Encounter (Signed)
Pt said he still have not received his lab results from 04-09-15.Please call him today if possible.

## 2015-05-04 NOTE — Telephone Encounter (Signed)
Spoke with pt, aware of lab results. 

## 2015-05-07 ENCOUNTER — Telehealth: Payer: Self-pay | Admitting: Internal Medicine

## 2015-05-07 MED ORDER — OXYCODONE HCL 20 MG PO TABS: 20.0000 mg | ORAL_TABLET | ORAL | Status: DC | PRN

## 2015-05-07 NOTE — Telephone Encounter (Signed)
He does not need to go up on his lasix. Rx for his oxycodone printed and signed.

## 2015-05-07 NOTE — Telephone Encounter (Signed)
Patient states that he had lab work done a month ago.  States Dr. Sharlet Salina was to call him back in regards to if he needed to go up on his lasix.  Is requesting a call back in regards.  Is also requesting script for oxycodone.

## 2015-05-07 NOTE — Telephone Encounter (Signed)
Patient aware that he does not need to increase lasix and he will come in to pick up his prescription. It has been placed up front in the cabinet.

## 2015-05-15 ENCOUNTER — Telehealth: Payer: Self-pay | Admitting: Cardiology

## 2015-05-15 NOTE — Telephone Encounter (Signed)
Spoke with pt, he is currently taking 12.5 mg of hydralazine, which is 1/2 of the 25 mg tablet. He is unable to cut them into well because they crumble. It only comes in 10 mg tablets, he wants to know if he can change to the 10 mg tablets. Will forward for dr Stanford Breed review

## 2015-05-15 NOTE — Telephone Encounter (Signed)
Please call, question about his Hydralazine

## 2015-05-15 NOTE — Telephone Encounter (Signed)
Yes [name]  

## 2015-05-16 MED ORDER — HYDRALAZINE HCL 10 MG PO TABS
10.0000 mg | ORAL_TABLET | Freq: Two times a day (BID) | ORAL | Status: DC
Start: 2015-05-16 — End: 2016-01-31

## 2015-05-16 NOTE — Telephone Encounter (Signed)
Spoke with pt, Aware of dr crenshaw's recommendations. New script sent to the pharmacy  

## 2015-05-17 ENCOUNTER — Other Ambulatory Visit: Payer: Self-pay | Admitting: Cardiology

## 2015-05-18 NOTE — Telephone Encounter (Signed)
Rx request sent to pharmacy.  

## 2015-05-29 NOTE — Progress Notes (Signed)
HPI: FU CAD; s/p NSTEMI followed by CABG 10/2008 (L-LAD, S-OM1/L PL br, S-RCA), AAA, carotid stenosis. Beta blocker previously held due to Mobitz Type 1. Patient had atrial flutter ablation in September 2016. Dr. Caryl Comes ordered a Holter monitor that showed significant pauses and heart block but patient was asleep and conservative management felt indicated. Since last seen, He has dyspnea on exertion which is chronic and unchanged. No orthopnea or PND. No palpitations, syncope or chest pain. Studies:  - LHC (09/2008): 3 v CAD, EF 65% => CABG  - Echo (8/15): Normal LV function, Mild aortic stenosis with mean gradient 12 mmHg, mild left atrial enlargement. - Carotid US ([id number]): RICA 123456; LICA 123456 - f/u 2 years  - Abdominal Ultrasound August 2016 showed dilatation of 2.6 x 2.6 cm. Follow-up recommended 1 year.  Current Outpatient Prescriptions  Medication Sig Dispense Refill  . aspirin EC 81 MG tablet Take 1 tablet (81 mg total) by mouth daily.    [name] atorvastatin (LIPITOR) 40 MG tablet Take 1 tablet (40 mg total) by mouth daily at 6 PM. 90 tablet 3  . buPROPion (WELLBUTRIN XL) 150 MG 24 hr tablet Take 150 mg by mouth daily.  3  . buPROPion (WELLBUTRIN XL) 150 MG 24 hr tablet TAKE 1 TABLET EVERY DAY 90 tablet 2  . finasteride (PROSCAR) 5 MG tablet Take 1 tablet (5 mg total) by mouth daily. EVERY EVENING 90 tablet 3  . fluticasone (FLONASE) 50 MCG/ACT nasal spray PLACE 1 SPRAY INTO BOTH NOSTRILS EVERY MORNING. 16 g 3  . furosemide (LASIX) 40 MG tablet Take 1 tablet (40 mg total) by mouth daily. Take 1 & 1/2 tablets (60 mg) by mouth once daily 90 tablet 1  . guaiFENesin (MUCINEX) 600 MG 12 hr tablet Take 1,200 mg by mouth 2 (two) times daily.      . hydrALAZINE (APRESOLINE) 10 MG tablet Take 1 tablet (10 mg total) by mouth 2 (two) times daily. 180 tablet 3  . KLOR-CON M20 20 MEQ tablet TAKE 2 TABLETS (40 MEQ TOTAL) BY MOUTH DAILY. EVERY EVENING 180 tablet 0  . losartan (COZAAR) 100 MG  tablet TAKE 1 TABLET (100 MG TOTAL) BY MOUTH DAILY. EVERY EVENING 90 tablet 0  . nystatin ointment (MYCOSTATIN) Apply 1 application topically 2 (two) times daily. 30 g 0  . Oxycodone HCl 20 MG TABS Take 1-2 tablets (20-40 mg total) by mouth every 4 (four) hours as needed. 180 each 0  . polyethylene glycol (MIRALAX / GLYCOLAX) packet TAKE 17 G BY MOUTH DAILY. AS DIRECTED 28 packet 7  . polyethylene glycol powder (GLYCOLAX/MIRALAX) powder Take 17 g by mouth 2 (two) times daily as needed. 850 g 1  . sertraline (ZOLOFT) 25 MG tablet Take 1 tablet (25 mg total) by mouth at bedtime. 30 tablet 5  . tamsulosin (FLOMAX) 0.4 MG CAPS capsule TAKE 1 CAPSULE (0.4 MG TOTAL) BY MOUTH DAILY AFTER SUPPER. 90 capsule 3  . triamcinolone cream (KENALOG) 0.1 % Apply 1 application topically 2 (two) times daily. 30 g 0  . VENTOLIN HFA 108 (90 BASE) MCG/ACT inhaler INHALE 2 PUFFS INTO THE LUNGS EVERY 6 HOURS AS NEEDED FOR WHEEZING. 3 Inhaler 2   No current facility-administered medications for this visit.     Past Medical History  Diagnosis Date  . 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 COPD (chronic obstructive pulmonary disease) ([place])   . Cerebrovascular disease   . AAA (abdominal  aortic aneurysm) ([place])   . 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 Hyperlipidemia   . HTN (hypertension)   . CONGENITAL UNSPEC REDUCTION DEFORMITY LOWER LIMB   . TOBACCO USE, QUIT   . CHF (congestive heart failure) ([place])   . 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 Cirrhosis ([place])     on CT a/p 07/2013, no longer drinking  . 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 AAA (abdominal aortic aneurysm) ([place])   . Carotid stenosis   . Shortness of breath     EXERTION  . Neuromuscular disorder ([place])     PT STATES HE HAS BEEN TOLD HE EITHER HAD POLIO OR CEREBRAL PALSY - STATES HIS LEFT LEG IS SHORTER AND AFFECTS HIS BALANCE - HE WEARS ELEVATED SHOE -LEFT    . Foley catheter in place   . Blindness of left eye   . Pain     BACK, LEGS AND ARMS  . Constipation due to opioid therapy 2014    began about a year ago    Past Surgical History  Procedure Laterality Date  . Coronary artery bypass graft  11/03/2008    Ricard Dillon - x4: left internal mammary artery to the distal left anterior descending, saphemous vein graft to the first circumflex marginal branch with a Y graft sequentiallly to a left posterolateral branch, saphenous vein graft to the distal right coronary artery  . Green light laser turp (transurethral resection of prostate N/A 02/14/2014    Procedure: GREEN LIGHT LASER TURP (TRANSURETHRAL RESECTION OF PROSTATE;  Surgeon: Festus Aloe, MD;  Location: [facility];  Service: Urology;  Laterality: N/A;  . Electrophysiologic study N/A 01/18/2015    Procedure: A-Flutter Ablation;  Surgeon: Deboraha Sprang, MD;  Location: [facility];  Service: Cardiovascular;  Laterality: N/A;    Social History   Social History  . Marital Status: Widowed    Spouse Name: N/A  . Number of Children: N/A  . Years of Education: N/A   Occupational History  . Not on file.   Social History Main Topics  . Smoking status: Former Research scientist (life sciences)  . Smokeless tobacco: Never Used  . Alcohol Use: No     Comment: History of heavy alcohol use per pt. Quit many years ago1/1/ 2005  . Drug Use: No  . Sexual Activity: Not on file   Other Topics Concern  . Not on file   Social History Narrative   lives alone here in [place].  He has 1 son, who lives in the [place], and he has     an uncle, who lives nearby as well.     Family History  Problem Relation Age of Onset  . Cirrhosis Mother     died at 30  . Colon cancer Neg Hx   . Stomach cancer Neg Hx     ROS: no fevers or chills, productive cough, hemoptysis, dysphasia, odynophagia, melena, hematochezia, dysuria, hematuria, rash, seizure activity, orthopnea, PND, claudication. Remaining systems are  negative.  Physical Exam: Well-developed well-nourished in no acute distress.  Skin is warm and dry.  HEENT is normal.  Neck is supple.  Chest is clear to auscultation with normal expansion.  Cardiovascular exam is regular rate and rhythm. 2/6 systolic murmur left sternal border. Abdominal exam nontender or distended. No masses palpated. Extremities show 1+ ankle edema. neuro grossly intact

## 2015-06-03 ENCOUNTER — Other Ambulatory Visit: Payer: Self-pay | Admitting: Internal Medicine

## 2015-06-04 ENCOUNTER — Telehealth: Payer: Self-pay | Admitting: Internal Medicine

## 2015-06-04 ENCOUNTER — Encounter: Payer: Self-pay | Admitting: Cardiology

## 2015-06-04 ENCOUNTER — Ambulatory Visit (INDEPENDENT_AMBULATORY_CARE_PROVIDER_SITE_OTHER): Payer: Medicare Other | Admitting: Cardiology

## 2015-06-04 VITALS — BP 156/60 | HR 86

## 2015-06-04 DIAGNOSIS — I10 Essential (primary) hypertension: Secondary | ICD-10-CM

## 2015-06-04 DIAGNOSIS — I350 Nonrheumatic aortic (valve) stenosis: Secondary | ICD-10-CM

## 2015-06-04 DIAGNOSIS — I714 Abdominal aortic aneurysm, without rupture, unspecified: Secondary | ICD-10-CM

## 2015-06-04 DIAGNOSIS — I441 Atrioventricular block, second degree: Secondary | ICD-10-CM

## 2015-06-04 DIAGNOSIS — I483 Typical atrial flutter: Secondary | ICD-10-CM

## 2015-06-04 DIAGNOSIS — I2510 Atherosclerotic heart disease of native coronary artery without angina pectoris: Secondary | ICD-10-CM | POA: Diagnosis not present

## 2015-06-04 DIAGNOSIS — E785 Hyperlipidemia, unspecified: Secondary | ICD-10-CM

## 2015-06-04 MED ORDER — OXYCODONE HCL 20 MG PO TABS: 20.0000 mg | ORAL_TABLET | ORAL | Status: DC | PRN

## 2015-06-04 NOTE — Assessment & Plan Note (Signed)
Patient noted to have pauses on previous monitor but occurred at night when asleep. No syncope. We will follow.

## 2015-06-04 NOTE — Assessment & Plan Note (Signed)
Follow-up abdominal ultrasound August 2017. 

## 2015-06-04 NOTE — Assessment & Plan Note (Signed)
Status post ablation. 

## 2015-06-04 NOTE — Telephone Encounter (Signed)
Printed and signed, please remind that in the future we generally require 24-48 hours for controlled substance refills.

## 2015-06-04 NOTE — Assessment & Plan Note (Signed)
Follow-up carotid Dopplers August 2018. 

## 2015-06-04 NOTE — Patient Instructions (Signed)
Your physician wants you to follow-up in: 6 MONTHS WITH DR CRENSHAW You will receive a reminder letter in the mail two months in advance. If you don't receive a letter, please call our office to schedule the follow-up appointment.   If you need a refill on your cardiac medications before your next appointment, please call your pharmacy.  

## 2015-06-04 NOTE — Assessment & Plan Note (Signed)
Continue statin. 

## 2015-06-04 NOTE — Telephone Encounter (Signed)
Pt is requesting refill for Oxycodone HCl 20 MG TABS [id number] He has an appointment this afternoon with Dr. Stanford Breed and he says he is coming in from [place] and is hoping he can go ahead and pick it up since he will be in town.

## 2015-06-04 NOTE — Telephone Encounter (Signed)
Patient aware. Placed in cabinet up front.  

## 2015-06-04 NOTE — Assessment & Plan Note (Signed)
Continue aspirin and statin. 

## 2015-06-04 NOTE — Assessment & Plan Note (Signed)
Blood pressure mildly elevated.However he had some dizziness with higher doses of hydralazine previously. We will follow and adjust regimen as needed.

## 2015-06-04 NOTE — Assessment & Plan Note (Signed)
Follow-up Echocardiogram August 2017.

## 2015-06-05 ENCOUNTER — Telehealth: Payer: Self-pay | Admitting: Cardiology

## 2015-06-05 NOTE — Telephone Encounter (Signed)
Pt saw Dr Stanford Breed yesterday. Pt have some questions,he is confused about some things.

## 2015-06-05 NOTE — Telephone Encounter (Signed)
Spoke with pt, questions regarding hydralazine answered.

## 2015-06-12 ENCOUNTER — Encounter: Payer: Self-pay | Admitting: Internal Medicine

## 2015-06-12 ENCOUNTER — Ambulatory Visit (INDEPENDENT_AMBULATORY_CARE_PROVIDER_SITE_OTHER): Payer: Medicare Other | Admitting: Internal Medicine

## 2015-06-12 VITALS — BP 122/62 | HR 90 | Temp 98.3°F | Resp 18 | Ht 72.0 in | Wt 211.0 lb

## 2015-06-12 DIAGNOSIS — R0600 Dyspnea, unspecified: Secondary | ICD-10-CM

## 2015-06-12 DIAGNOSIS — G8929 Other chronic pain: Secondary | ICD-10-CM

## 2015-06-12 DIAGNOSIS — M549 Dorsalgia, unspecified: Secondary | ICD-10-CM | POA: Diagnosis not present

## 2015-06-12 DIAGNOSIS — I2510 Atherosclerotic heart disease of native coronary artery without angina pectoris: Secondary | ICD-10-CM

## 2015-06-12 MED ORDER — UMECLIDINIUM-VILANTEROL 62.5-25 MCG/INH IN AEPB: 1.0000 | INHALATION_SPRAY | Freq: Every day | RESPIRATORY_TRACT | Status: DC

## 2015-06-12 MED ORDER — TRIAMCINOLONE ACETONIDE 0.1 % EX CREA: 1.0000 "application " | TOPICAL_CREAM | Freq: Two times a day (BID) | CUTANEOUS | Status: DC

## 2015-06-12 MED ORDER — PREGABALIN 100 MG PO CAPS: 100.0000 mg | ORAL_CAPSULE | Freq: Two times a day (BID) | ORAL | Status: DC

## 2015-06-12 MED ORDER — NYSTATIN 100000 UNIT/GM EX OINT: 1.0000 "application " | TOPICAL_OINTMENT | Freq: Two times a day (BID) | CUTANEOUS | Status: DC

## 2015-06-12 NOTE — Patient Instructions (Signed)
We have cleaned out the ears today.   We have sent in anoro for the breathing. Take 1 puff daily and see if that helps with the endurance in 1-2 weeks.   We have also sent in lyrica to add for pain. 1 pill twice a day. You can take this with the oxycodone as well.

## 2015-06-12 NOTE — Progress Notes (Signed)
Pre visit review using our clinic review tool, if applicable. No additional management support is needed unless otherwise documented below in the visit note. 

## 2015-06-13 ENCOUNTER — Telehealth: Payer: Self-pay | Admitting: Internal Medicine

## 2015-06-13 NOTE — Telephone Encounter (Signed)
Pt called and states his prescription for pregabalin (LYRICA) 100 MG capsule [id number] is needing a PA

## 2015-06-14 NOTE — Telephone Encounter (Signed)
There is no insurance information on file for this pt. Would you please contact him for the insurance name, ID number, group, and BIN? Thanks

## 2015-06-15 NOTE — Assessment & Plan Note (Signed)
Will add lyrica as he is not well controlled on his oxycodone and this may even help more.

## 2015-06-15 NOTE — Telephone Encounter (Signed)
Pt called back to check on this, please call pt back.   [employer] in addition to Medicare ID [place]: [place]: [id number]

## 2015-06-15 NOTE — Progress Notes (Signed)
   Subjective:    Patient ID: [name], male    DOB: February 19, 1941, 75 y.o.   MRN: [id number]  HPI The patient is a 75 YO man coming in for SOB. He has been struggling with this for some time. Went to see his cardiologist recently and they did not think that it was related to his heart. Changing his fluid pill did not help with the SOB. Happens with very short distances. Cannot do much as he needs to stop and rest. Also some ear blockage and hard time hearing. No chest pains, or new cough. No production or cold symptoms.   Review of Systems  Constitutional: Positive for activity change and fatigue. Negative for fever, appetite change and unexpected weight change.  HENT: Negative.   Respiratory: Positive for shortness of breath. Negative for cough, chest tightness and wheezing.   Cardiovascular: Negative for chest pain, palpitations and leg swelling.  Gastrointestinal: Negative for abdominal pain, constipation and abdominal distention.  Musculoskeletal: Positive for back pain and arthralgias.  Neurological: Positive for weakness. Negative for dizziness, syncope and light-headedness.      Objective:   Physical Exam  Constitutional: He is oriented to person, place, and time. He appears well-developed and well-nourished.  Overweight  HENT:  Head: Normocephalic and atraumatic.  Eyes: EOM are normal.  Neck: Normal range of motion.  Cardiovascular: Normal rate and regular rhythm.   Pulmonary/Chest: Effort normal and breath sounds normal. No respiratory distress. He has no wheezes. He has no rales.  Abdominal: Soft. Bowel sounds are normal. He exhibits no distension. There is no tenderness. There is no rebound.  Musculoskeletal: He exhibits no edema.  Ankles without edema  Neurological: He is alert and oriented to person, place, and time. Coordination normal.  Skin: Skin is warm and dry.   Filed Vitals:   06/12/15 1559  BP: 122/62  Pulse: 90  Temp: 98.3 F (36.8 C)  TempSrc: Oral    Resp: 18  Height: 6' (1.829 m)  Weight: 211 lb (95.709 kg)  SpO2: 95%      Assessment & Plan:  Both ears lavaged and TM clear after disimpaction

## 2015-06-15 NOTE — Assessment & Plan Note (Signed)
As cardiology does not feel that this is cardiac will try anoro for breathing daily. Does not get much relief with albuterol. Could be some deconditioning and would likely benefit from some graduated exercise and talked to him about that today.

## 2015-06-18 MED ORDER — GABAPENTIN 300 MG PO CAPS: 300.0000 mg | ORAL_CAPSULE | Freq: Two times a day (BID) | ORAL | Status: DC

## 2015-06-18 NOTE — Telephone Encounter (Signed)
Lyrica in not covered under pt's insurance plan and likely to be denied as a PA since he has not tried [place]. Would MD consider changing medication to Gabapentin?

## 2015-06-18 NOTE — Telephone Encounter (Signed)
Pt advised in detail and understood

## 2015-06-18 NOTE — Telephone Encounter (Signed)
Sent in gabapentin he can take 1 pill daily for first 3-4 days then increase to 1 pill twice daily.

## 2015-06-29 ENCOUNTER — Telehealth: Payer: Self-pay | Admitting: *Deleted

## 2015-06-29 MED ORDER — OXYCODONE HCL 20 MG PO TABS: 20.0000 mg | ORAL_TABLET | ORAL | Status: DC | PRN

## 2015-06-29 NOTE — Telephone Encounter (Signed)
Printed and signed.  

## 2015-06-29 NOTE — Telephone Encounter (Signed)
Notified pt rx ready for pick-up.../lmb 

## 2015-06-29 NOTE — Telephone Encounter (Signed)
Requesting refill on his oxycodone.../lmb 

## 2015-07-09 ENCOUNTER — Ambulatory Visit: Payer: Medicare Other | Admitting: Internal Medicine

## 2015-07-24 ENCOUNTER — Ambulatory Visit: Payer: Medicare Other

## 2015-07-26 ENCOUNTER — Telehealth: Payer: Self-pay | Admitting: *Deleted

## 2015-07-26 NOTE — Telephone Encounter (Signed)
Requesting refill onhis Oxycodone. MD out of office pls advise...[name]

## 2015-07-26 NOTE — Telephone Encounter (Signed)
OK to fill this prescription with additional refills x0 OV q 3 mo Thank you!  

## 2015-07-27 MED ORDER — OXYCODONE HCL 20 MG PO TABS: 20.0000 mg | ORAL_TABLET | ORAL | Status: DC | PRN

## 2015-07-27 NOTE — Telephone Encounter (Signed)
Notified pt rx ready for pick-up.../lmb 

## 2015-07-29 ENCOUNTER — Other Ambulatory Visit: Payer: Self-pay | Admitting: Cardiology

## 2015-07-30 NOTE — Telephone Encounter (Signed)
Rx request sent to pharmacy.  

## 2015-07-31 ENCOUNTER — Other Ambulatory Visit: Payer: Self-pay | Admitting: Internal Medicine

## 2015-08-01 ENCOUNTER — Other Ambulatory Visit: Payer: Self-pay | Admitting: Cardiology

## 2015-08-01 NOTE — Telephone Encounter (Signed)
Rx(s) sent to pharmacy electronically.  

## 2015-08-23 ENCOUNTER — Telehealth: Payer: Self-pay | Admitting: *Deleted

## 2015-08-23 MED ORDER — OXYCODONE HCL 20 MG PO TABS: 20.0000 mg | ORAL_TABLET | ORAL | Status: DC | PRN

## 2015-08-23 NOTE — Telephone Encounter (Signed)
Notified pt rx ready for pick-up.../lmb 

## 2015-08-23 NOTE — Telephone Encounter (Signed)
MD is out of the office until 09/03/15. Pls advise on refill...[name]

## 2015-08-23 NOTE — Telephone Encounter (Signed)
done

## 2015-08-23 NOTE — Telephone Encounter (Signed)
Received call pt requesting refill on his pain med " Oxycodone".../lmb 

## 2015-08-28 ENCOUNTER — Other Ambulatory Visit: Payer: Self-pay | Admitting: Internal Medicine

## 2015-09-17 ENCOUNTER — Other Ambulatory Visit: Payer: Self-pay | Admitting: Cardiology

## 2015-09-18 NOTE — Telephone Encounter (Signed)
Rx(s) sent to pharmacy electronically.  

## 2015-09-20 ENCOUNTER — Telehealth: Payer: Self-pay | Admitting: *Deleted

## 2015-09-20 DIAGNOSIS — N401 Enlarged prostate with lower urinary tract symptoms: Secondary | ICD-10-CM | POA: Diagnosis not present

## 2015-09-20 MED ORDER — OXYCODONE HCL 20 MG PO TABS: 20.0000 mg | ORAL_TABLET | ORAL | Status: DC | PRN

## 2015-09-20 NOTE — Telephone Encounter (Signed)
Receive call pt requesting refill on his Oxycodone...[name]

## 2015-09-20 NOTE — Telephone Encounter (Signed)
Printed and signed, please remind in the future we do require 24-48 hour notice for controlled substances.

## 2015-09-25 DIAGNOSIS — N5201 Erectile dysfunction due to arterial insufficiency: Secondary | ICD-10-CM | POA: Diagnosis not present

## 2015-09-25 DIAGNOSIS — N312 Flaccid neuropathic bladder, not elsewhere classified: Secondary | ICD-10-CM | POA: Diagnosis not present

## 2015-09-25 DIAGNOSIS — R972 Elevated prostate specific antigen [PSA]: Secondary | ICD-10-CM | POA: Diagnosis not present

## 2015-10-04 ENCOUNTER — Other Ambulatory Visit: Payer: Self-pay | Admitting: Internal Medicine

## 2015-10-17 ENCOUNTER — Telehealth: Payer: Self-pay | Admitting: *Deleted

## 2015-10-17 MED ORDER — OXYCODONE HCL 20 MG PO TABS: 20.0000 mg | ORAL_TABLET | ORAL | Status: DC | PRN

## 2015-10-17 NOTE — Telephone Encounter (Signed)
Notified pt rx ready for pick-up.../lmb 

## 2015-10-17 NOTE — Telephone Encounter (Signed)
Printed and signed.  

## 2015-10-17 NOTE — Telephone Encounter (Signed)
Rec'd call pt requesting refill on his pian med Oxycodone...[name]

## 2015-10-26 ENCOUNTER — Telehealth: Payer: Self-pay

## 2015-10-26 MED ORDER — FINASTERIDE 5 MG PO TABS: 5.0000 mg | ORAL_TABLET | Freq: Every evening | ORAL | Status: DC

## 2015-10-26 NOTE — Telephone Encounter (Signed)
Pt lm on triage. Rf rq for finasteride.   erx sent to pof.

## 2015-10-31 ENCOUNTER — Telehealth: Payer: Self-pay | Admitting: Cardiology

## 2015-10-31 DIAGNOSIS — I714 Abdominal aortic aneurysm, without rupture, unspecified: Secondary | ICD-10-CM

## 2015-10-31 DIAGNOSIS — I350 Nonrheumatic aortic (valve) stenosis: Secondary | ICD-10-CM

## 2015-10-31 NOTE — Telephone Encounter (Signed)
New Message  Pt stated that he thinks he is to have multiple tests done this year before his Sept OV w/ Dr Stanford Breed- no orders in syst. Pt requested to speak w/ RN- Please call back and discuss.

## 2015-10-31 NOTE — Telephone Encounter (Signed)
Spoke with pt, he is due for abdominal US for AAA and echo for AS. Orders placed and sent to admin for scheduling.

## 2015-11-08 ENCOUNTER — Ambulatory Visit (HOSPITAL_COMMUNITY)
Admission: RE | Admit: 2015-11-08 | Discharge: 2015-11-08 | Disposition: A | Discharge status: Home / Self Care | Payer: Medicare Other | Source: Ambulatory Visit | Attending: Cardiovascular Disease | Admitting: Cardiovascular Disease

## 2015-11-08 DIAGNOSIS — I110 Hypertensive heart disease with heart failure: Secondary | ICD-10-CM | POA: Diagnosis not present

## 2015-11-08 DIAGNOSIS — I509 Heart failure, unspecified: Secondary | ICD-10-CM | POA: Insufficient documentation

## 2015-11-08 DIAGNOSIS — I700 Atherosclerosis of aorta: Secondary | ICD-10-CM | POA: Insufficient documentation

## 2015-11-08 DIAGNOSIS — I2510 Atherosclerotic heart disease of native coronary artery without angina pectoris: Secondary | ICD-10-CM | POA: Insufficient documentation

## 2015-11-08 DIAGNOSIS — I708 Atherosclerosis of other arteries: Secondary | ICD-10-CM | POA: Diagnosis not present

## 2015-11-08 DIAGNOSIS — J449 Chronic obstructive pulmonary disease, unspecified: Secondary | ICD-10-CM | POA: Insufficient documentation

## 2015-11-08 DIAGNOSIS — E785 Hyperlipidemia, unspecified: Secondary | ICD-10-CM | POA: Diagnosis not present

## 2015-11-08 DIAGNOSIS — I714 Abdominal aortic aneurysm, without rupture, unspecified: Secondary | ICD-10-CM

## 2015-11-13 ENCOUNTER — Telehealth: Payer: Self-pay | Admitting: Emergency Medicine

## 2015-11-13 MED ORDER — OXYCODONE HCL 20 MG PO TABS: 20.0000 mg | ORAL_TABLET | ORAL | 0 refills | Status: DC | PRN

## 2015-11-13 NOTE — Telephone Encounter (Signed)
Notified pt rx ready for p/u...[name]

## 2015-11-13 NOTE — Telephone Encounter (Signed)
Pt called and needs a refill on Oxycodone HCl 20 MG TABS. Please follow up thanks.

## 2015-11-13 NOTE — Telephone Encounter (Signed)
Printed and signed.  

## 2015-11-16 ENCOUNTER — Other Ambulatory Visit (HOSPITAL_COMMUNITY): Payer: Medicare Other

## 2015-11-19 ENCOUNTER — Encounter: Payer: Self-pay | Admitting: Gastroenterology

## 2015-11-19 NOTE — Progress Notes (Deleted)
Subjective:   [name] is a 75 y.o. male who presents for Medicare Annual/Subsequent preventive examination.  Review of Systems:   HRA assessment completed during this visit with   The Patient was informed that the wellness visit is to identify future health risk and educate and initiate measures that can reduce risk for increased disease through the lifespan.    NO ROS; Medicare Wellness Visit Last OV:   Labs completed:    Psychosocial:  Medications reviewed for issues; compliance; otc meds  BMI:   Diet;  Heart healthy? Fried foods? Sodium: Cholesterol Nutritional counseling given:  Teeth or Denture issues?   Exercise;  active vs sedentary What is the hardest physical activity you have done recently and for how long?   HOME SAFETY reviewed for short term and long term;  Issues reviewed that may potentiate risk include multilevel or inaccessible homes or long term plan?  Personal safety issues reviewed for risk such as safe community; smoke Secondary school teacher; firearms safety if applicable; protection when in the sun; driving safety for seniors or any recent accidents. Fall hx;  Fear of falling?  UA or BOWEL incontinence;  Functional losses from last year to this year?  Given education on "Fall Prevention in the Home" for more safety tips the patient can apply as appropriate.   Risk for Depression reviewed: Any emotional problems? Anxious, depressed, irritable, sad or blue?  Denies feeling depressed or hopeless; voices pleasure in daily life How many social activities have you been engaged in within the last 2 weeks? Who would help you with chores; illness; shopping other? Sleep   Cognitive;  Manages checkbook, medications; no failures of task Ad8 score reviewed for issues;  Issues making decisions; no  Less interest in hobbies / activities" no  Repeats questions, stories; family complaining: NO  Trouble using ordinary gadgets; microwave; computer: no  Forgets  the month or year: no  Mismanaging finances: no  Missing apt: no but does write them down  Daily problems with thinking of memory NO Ad8 score is 0   Advanced Directive addressed; Completed or educated   Counseling Health Maintenance Gaps:   Hearing:  Right ear Left ear Difficulty hearing a whisper Tinnitus; American Tinnitus Association;   Ophthalmology exam Glaucoma screening Diabetic retinopathy if appropriate   Immunizations Due: (Vaccines reviewed and educated regarding any overdue)    Established and updated Risk reviewed and appropriate referral made or health recommendations:  Current Care Team reviewed and updated        Objective:    Vitals: There were no vitals taken for this visit.  There is no height or weight on file to calculate BMI.  Tobacco History  Smoking Status  . Former Smoker  Smokeless Tobacco  . Never Used     Counseling given: Not Answered   Past Medical History:  Diagnosis Date  . AAA (abdominal aortic aneurysm) ([place])   . AAA (abdominal aortic aneurysm) ([place])   . Blindness of left eye   . 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 CAD (coronary artery disease)    s/p CABG 10/2008  . Carotid stenosis   . Cerebrovascular disease   . CHF (congestive heart failure) ([place])   . Cirrhosis ([place])    on CT a/p 07/2013, no longer drinking  . CONGENITAL UNSPEC REDUCTION DEFORMITY LOWER LIMB   . Constipation due to opioid therapy 2014   began about a year ago  . COPD (chronic obstructive pulmonary disease) ([place])   .  Foley catheter in place   . HTN (hypertension)   . Hyperlipidemia   . 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 Neuromuscular disorder ([place])    PT STATES HE HAS BEEN TOLD HE EITHER HAD POLIO OR CEREBRAL PALSY - STATES HIS LEFT LEG IS SHORTER AND AFFECTS HIS BALANCE - HE WEARS ELEVATED SHOE -LEFT  . Pain    BACK, LEGS AND  ARMS  . Shortness of breath    EXERTION  . TOBACCO USE, QUIT    Past Surgical History:  Procedure Laterality Date  . CORONARY ARTERY BYPASS GRAFT  11/03/2008   Ricard Dillon - x4: left internal mammary artery to the distal left anterior descending, saphemous vein graft to the first circumflex marginal branch with a Y graft sequentiallly to a left posterolateral branch, saphenous vein graft to the distal right coronary artery  . ELECTROPHYSIOLOGIC STUDY N/A 01/18/2015   Procedure: A-Flutter Ablation;  Surgeon: Deboraha Sprang, MD;  Location: [facility];  Service: Cardiovascular;  Laterality: N/A;  . GREEN LIGHT LASER TURP (TRANSURETHRAL RESECTION OF PROSTATE N/A 02/14/2014   Procedure: GREEN LIGHT LASER TURP (TRANSURETHRAL RESECTION OF PROSTATE;  Surgeon: Festus Aloe, MD;  Location: [facility];  Service: Urology;  Laterality: N/A;   Family History  Problem Relation Age of Onset  . Cirrhosis Mother     died at 2  . Colon cancer Neg Hx   . Stomach cancer Neg Hx    History  Sexual Activity  . Sexual activity: Not on file    Outpatient Encounter Prescriptions as of 11/20/2015  Medication Sig  . aspirin EC 81 MG tablet Take 1 tablet (81 mg total) by mouth daily.  [name] atorvastatin (LIPITOR) 40 MG tablet Take 1 tablet (40 mg total) by mouth daily at 6 PM.  . buPROPion (WELLBUTRIN XL) 150 MG 24 hr tablet Take 150 mg by mouth daily. Reported on 06/12/2015  . buPROPion (WELLBUTRIN XL) 150 MG 24 hr tablet TAKE 1 TABLET EVERY DAY (Patient not taking: Reported on 06/12/2015)  . finasteride (PROSCAR) 5 MG tablet Take 1 tablet (5 mg total) by mouth every evening.  . fluticasone (FLONASE) 50 MCG/ACT nasal spray PLACE 1 SPRAY INTO BOTH NOSTRILS EVERY MORNING.  . furosemide (LASIX) 40 MG tablet TAKE 1 AND 1/2 TABLETS BY MOUTH ONCE DAILY  . gabapentin (NEURONTIN) 300 MG capsule Take 1 capsule (300 mg total) by mouth 2 (two) times daily.  [name] guaiFENesin (MUCINEX) 600 MG 12 hr tablet Take 1,200 mg by mouth 2  (two) times daily.    . hydrALAZINE (APRESOLINE) 10 MG tablet Take 1 tablet (10 mg total) by mouth 2 (two) times daily.  [name] KLOR-CON M20 20 MEQ tablet TAKE 2 TABLETS (40 MEQ TOTAL) BY MOUTH DAILY. EVERY EVENING  . losartan (COZAAR) 100 MG tablet TAKE 1 TABLET (100 MG TOTAL) BY MOUTH DAILY. EVERY EVENING  . nystatin ointment (MYCOSTATIN) APPLY TO AFFECTED AREA TWICE A DAY  . Oxycodone HCl 20 MG TABS Take 1-2 tablets (20-40 mg total) by mouth every 4 (four) hours as needed.  . polyethylene glycol (MIRALAX / GLYCOLAX) packet TAKE 17 G BY MOUTH DAILY. AS DIRECTED (Patient not taking: Reported on 06/12/2015)  . polyethylene glycol powder (GLYCOLAX/MIRALAX) powder Take 17 g by mouth 2 (two) times daily as needed.  . pregabalin (LYRICA) 100 MG capsule Take 1 capsule (100 mg total) by mouth 2 (two) times daily.  . sertraline (ZOLOFT)  25 MG tablet Take 1 tablet (25 mg total) by mouth at bedtime.  . tamsulosin (FLOMAX) 0.4 MG CAPS capsule TAKE 1 CAPSULE (0.4 MG TOTAL) BY MOUTH DAILY AFTER SUPPER.  . tamsulosin (FLOMAX) 0.4 MG CAPS capsule TAKE 1 CAPSULE (0.4 MG TOTAL) BY MOUTH DAILY AFTER SUPPER.  [name] triamcinolone cream (KENALOG) 0.1 % APPLY TO AFFECTED AREA TWICE A DAY  . umeclidinium-vilanterol (ANORO ELLIPTA) 62.5-25 MCG/INH AEPB Inhale 1 puff into the lungs daily.  . VENTOLIN HFA 108 (90 BASE) MCG/ACT inhaler INHALE 2 PUFFS INTO THE LUNGS EVERY 6 HOURS AS NEEDED FOR WHEEZING.   No facility-administered encounter medications on file as of 11/20/2015.     Activities of Daily Living In your present state of health, do you have any difficulty performing the following activities: 01/18/2015 01/18/2015  Hearing? - N  Vision? - N  Difficulty concentrating or making decisions? - N  Walking or climbing stairs? - Y  Dressing or bathing? - N  Doing errands, shopping? N -  Some recent data might be hidden    Patient Care Team: [name], MD as PCP - General (Internal Medicine) Lelon Perla, MD  as Consulting Physician (Cardiology) Rexene Alberts, MD as Consulting Physician (Cardiothoracic Surgery) Rolan Bucco, MD (Inactive) (Urology)   Assessment:     Colonoscopy due   cardiology; MI 2010; AVB; hyperlipidemia; HTN  Heart healthy diet reviewed  Fat free or low fat dairy products Fish high in omega-3 acids ( salmon, tuna, trout) Fruits, such as apples, bananas, oranges, pears, prunes Legumes, such as kidney beans, lentils, checkpeas, black-eyed peas and lima beans Vegetables; broccoli, cabbage, carrots Whole grains;   Plant fats are better; decrease "white" foods as pasta, rice, bread and desserts, sugar; Avoid red meat (limiting) palm and coconut oils; sugary foods and beverages  Two nutrients that raise blood chol levels are saturated fats and trans fat; in hydrogenated oils and fats, as stick margarine, baked goods (cookes, cakes, pies, crackers; frosting; and coffee creamers;   Some Fats lower cholesterol: Monounsaturated and polyunsaturated  Avocados Corn, sunflower, and soybean oils Nuts and seeds, such as walnuts Olive, canola, peanut, safflower, and sesame oils Peanut butter Salmon and trout Tofu     Exercise Activities and Dietary recommendations    Goals    None     Fall Risk Fall Risk  10/30/2014 04/11/2013  Falls in the past year? No No   Depression Screen PHQ 2/9 Scores 10/30/2014 04/11/2013  PHQ - 2 Score 1 1    Cognitive Testing No flowsheet data found.  Immunization History  Administered Date(s) Administered  . Influenza Split 01/20/2012  . Influenza Whole 01/19/2009, 01/07/2010  . Influenza, High Dose Seasonal PF 01/19/2013, 12/29/2014  . Influenza,inj,Quad PF,36+ Mos 12/22/2013  . Pneumococcal Conjugate-13 10/30/2014  . Pneumococcal Polysaccharide-23 01/19/2009  . Td 04/03/2012  . Zoster 01/19/2015   Screening Tests Health Maintenance  Topic Date Due  . INFLUENZA VACCINE  11/20/2015  . COLONOSCOPY  01/06/2016  . TETANUS/TDAP   04/03/2022  . ZOSTAVAX  Completed  . PNA vac Low Risk Adult  Completed      Plan:      During the course of the visit the patient was educated and counseled about the following appropriate screening and preventive services:   Vaccines to include Pneumoccal, Influenza, Hepatitis B, Td, Zostavax, HCV  None due at present / flu this fall  Electrocardiogram - 02/22/2015  Cardiovascular Disease/ cardiology; MI 2010; AVB; hyperlipidemia; HTN   Colorectal  cancer screening 12/2015  Diabetes screening  n/a  Prostate Cancer Screening - Deferred   Glaucoma screening/ Eye exam?   Nutrition counseling / heart healthy  Smoking cessation counseling/ Former smoker   Patient Instructions (the written plan) was given to the patient.    Wynetta Fines, RN  11/19/2015

## 2015-11-20 ENCOUNTER — Ambulatory Visit: Payer: Medicare Other

## 2015-11-22 ENCOUNTER — Other Ambulatory Visit: Payer: Self-pay | Admitting: Internal Medicine

## 2015-11-27 ENCOUNTER — Other Ambulatory Visit (HOSPITAL_COMMUNITY): Payer: Medicare Other

## 2015-12-07 ENCOUNTER — Encounter: Payer: Self-pay | Admitting: Gastroenterology

## 2015-12-11 ENCOUNTER — Telehealth: Payer: Self-pay | Admitting: *Deleted

## 2015-12-11 NOTE — Telephone Encounter (Signed)
Thanks. Will evaluate at [facility] as per note Lelan Pons PV

## 2015-12-11 NOTE — Telephone Encounter (Signed)
Dr Havery Moros,  This gentleman is scheduled for a PV 8-28 Monday with a colon with you 9-20 Wednesday. He has a hx of colon polyps with Dr Sharlett Iles. Reviewing his chart, he has a hx of NSTEMI with CABG 10-2008, aflutter ablation 12-2014, his last EF was 65% with his CABG,  he has a hx of AAA, HTN, COPD, BPH, depression.   He had  An echo and AAA [place] scheduled in august 2017 an dhe has cancelled both due to transportation issues. Do you want these tests completed prior to his colon?  He last saw cardio 10-2015  Please advise, Thanks for your time, Marijean Niemann

## 2015-12-11 NOTE — Telephone Encounter (Signed)
Chart reviewed, as long as he has no current chest pain / dyspnea and is otherwise stable without complaints I think it is okay to direct book. Last EF was normal a year ago. Thanks

## 2015-12-13 ENCOUNTER — Ambulatory Visit (INDEPENDENT_AMBULATORY_CARE_PROVIDER_SITE_OTHER): Payer: Medicare Other | Admitting: Internal Medicine

## 2015-12-13 ENCOUNTER — Encounter: Payer: Self-pay | Admitting: Internal Medicine

## 2015-12-13 ENCOUNTER — Telehealth: Payer: Self-pay | Admitting: Cardiology

## 2015-12-13 ENCOUNTER — Other Ambulatory Visit (INDEPENDENT_AMBULATORY_CARE_PROVIDER_SITE_OTHER): Payer: Medicare Other

## 2015-12-13 VITALS — BP 152/60 | HR 66 | Temp 97.8°F | Resp 20 | Ht 71.5 in | Wt 203.0 lb

## 2015-12-13 DIAGNOSIS — I2510 Atherosclerotic heart disease of native coronary artery without angina pectoris: Secondary | ICD-10-CM

## 2015-12-13 DIAGNOSIS — Z23 Encounter for immunization: Secondary | ICD-10-CM

## 2015-12-13 DIAGNOSIS — G8929 Other chronic pain: Secondary | ICD-10-CM

## 2015-12-13 DIAGNOSIS — M549 Dorsalgia, unspecified: Secondary | ICD-10-CM

## 2015-12-13 DIAGNOSIS — Z79891 Long term (current) use of opiate analgesic: Secondary | ICD-10-CM | POA: Diagnosis not present

## 2015-12-13 DIAGNOSIS — E785 Hyperlipidemia, unspecified: Secondary | ICD-10-CM | POA: Diagnosis not present

## 2015-12-13 DIAGNOSIS — I10 Essential (primary) hypertension: Secondary | ICD-10-CM | POA: Diagnosis not present

## 2015-12-13 LAB — COMPREHENSIVE METABOLIC PANEL
ALT: 16 U/L (ref 0–53)
AST: 18 U/L (ref 0–37)
Albumin: 4 g/dL (ref 3.5–5.2)
Alkaline Phosphatase: 116 U/L (ref 39–117)
BUN: 20 mg/dL (ref 6–23)
CO2: 25 mEq/L (ref 19–32)
Calcium: 8.7 mg/dL (ref 8.4–10.5)
Chloride: 106 mEq/L (ref 96–112)
Creatinine, Ser: 1.34 mg/dL (ref 0.40–1.50)
GFR: 55.52 mL/min — ABNORMAL LOW (ref >60.00)
Glucose, Bld: 104 mg/dL — ABNORMAL HIGH (ref 70–99)
Potassium: 4.7 mEq/L (ref 3.5–5.1)
Sodium: 138 mEq/L (ref 135–145)
Total Bilirubin: 1 mg/dL (ref 0.2–1.2)
Total Protein: 7.4 g/dL (ref 6.0–8.3)

## 2015-12-13 LAB — LIPID PANEL
Cholesterol: 115 mg/dL (ref 0–200)
HDL: 33 mg/dL — ABNORMAL LOW (ref >39.00)
LDL Cholesterol: 59 mg/dL (ref 0–99)
NonHDL: 82.11
Total CHOL/HDL Ratio: 3
Triglycerides: 117 mg/dL (ref 0.0–149.0)
VLDL: 23.4 mg/dL (ref 0.0–40.0)

## 2015-12-13 MED ORDER — OXYCODONE HCL 20 MG PO TABS: 20.0000 mg | ORAL_TABLET | ORAL | 0 refills | Status: DC | PRN

## 2015-12-13 NOTE — Telephone Encounter (Signed)
Returned patient call-made patient aware that he had Abdominal US in July 2017-1 yr follow up recommended by MD Crenshaw.  Therefore another is not needed at this time-will cancel [place] scheduled on 8/31.  Advised he will need to keep appt for 9/1 for echo, verbalized understanding.

## 2015-12-13 NOTE — Progress Notes (Signed)
Pre visit review using our clinic review tool, if applicable. No additional management support is needed unless otherwise documented below in the visit note. 

## 2015-12-13 NOTE — Telephone Encounter (Signed)
New message   Pt verbalized that he had a AAA duplex done ion July and he has one scheduled for next week pt wants to know if he can cancel or if he needs to have it done again.

## 2015-12-13 NOTE — Patient Instructions (Signed)
We will check the blood today as well as the urine to make sure everything is doing well.   We have refilled the medicine today and given that to you.

## 2015-12-13 NOTE — Progress Notes (Signed)
   Subjective:    Patient ID: [name], male    DOB: 07/09/1940, 75 y.o.   MRN: [id number]  HPI The patient is a 75 YO man coming in for follow up of his chronic pain (doing okay with the oxycodone still, has found some other ways to deal with his pain since the medicine does not always work well, taking miralax daily to help with the resultant constipation). He is not having new complaints today. He is still taking his other medications as he should.   Review of Systems  Constitutional: Positive for activity change. Negative for appetite change, fatigue, fever and unexpected weight change.  Respiratory: Negative for cough, chest tightness, shortness of breath and wheezing.   Cardiovascular: Negative for chest pain, palpitations and leg swelling.  Gastrointestinal: Negative for abdominal distention, abdominal pain and constipation.  Musculoskeletal: Positive for arthralgias and back pain.  Neurological: Positive for weakness. Negative for dizziness, syncope and light-headedness.      Objective:   Physical Exam  Constitutional: He is oriented to person, place, and time. He appears well-developed and well-nourished.  Overweight  HENT:  Head: Normocephalic and atraumatic.  Eyes: EOM are normal.  Neck: Normal range of motion.  Cardiovascular: Normal rate and regular rhythm.   Pulmonary/Chest: Effort normal and breath sounds normal. No respiratory distress. He has no wheezes. He has no rales.  Abdominal: Soft. Bowel sounds are normal. He exhibits no distension. There is no tenderness. There is no rebound.  Musculoskeletal: He exhibits no edema.  Neurological: He is alert and oriented to person, place, and time. Coordination normal.  Skin: Skin is warm and dry.   Vitals:   12/13/15 1304  BP: (!) 150/60  Pulse: 66  Resp: 20  Temp: 97.8 F (36.6 C)  TempSrc: Oral  SpO2: 96%  Weight: 203 lb (92.1 kg)  Height: 5' 11.5" (1.816 m)      Assessment & Plan:  Flu shot given at  visit.

## 2015-12-14 ENCOUNTER — Other Ambulatory Visit: Payer: Self-pay

## 2015-12-14 NOTE — Assessment & Plan Note (Signed)
BP at goal on his hydralazine, losartan, tamsulosin, lasix. Checking CMP and adjust as needed.

## 2015-12-14 NOTE — Assessment & Plan Note (Signed)
No early refills. Checking UDS today. Refill of his oxycodone given at today's visit. Side effects of his treatment include constipation which is well controlled with miralax. He does have a controlled substance contract and reviewed with him the risks of long term usage.

## 2015-12-14 NOTE — Assessment & Plan Note (Signed)
Checking lipid panel today and adjust the lipitor as needed.

## 2015-12-20 ENCOUNTER — Other Ambulatory Visit (HOSPITAL_COMMUNITY): Payer: Medicare Other

## 2015-12-20 ENCOUNTER — Ambulatory Visit (AMBULATORY_SURGERY_CENTER): Payer: Self-pay

## 2015-12-20 VITALS — Ht 71.5 in | Wt 193.0 lb

## 2015-12-20 DIAGNOSIS — Z860100 Personal history of colon polyps, unspecified: Secondary | ICD-10-CM

## 2015-12-20 MED ORDER — SUPREP BOWEL PREP KIT 17.5-3.13-1.6 GM/177ML PO SOLN: 1.0000 | Freq: Once | ORAL | 0 refills | Status: AC

## 2015-12-20 NOTE — Progress Notes (Signed)
No allergies to eggs or soy No past problems with anesthesia No home oxygen No diet meds  Declined emmi 

## 2015-12-21 ENCOUNTER — Other Ambulatory Visit (HOSPITAL_COMMUNITY): Payer: Medicare Other

## 2015-12-29 ENCOUNTER — Other Ambulatory Visit: Payer: Self-pay | Admitting: Internal Medicine

## 2016-01-07 ENCOUNTER — Telehealth: Payer: Self-pay | Admitting: Gastroenterology

## 2016-01-07 NOTE — Telephone Encounter (Signed)
Pt informed that he will not be charged the cancellation fee. He will call back to r/s after speaking to his son.

## 2016-01-08 ENCOUNTER — Other Ambulatory Visit (HOSPITAL_COMMUNITY): Payer: Medicare Other

## 2016-01-09 ENCOUNTER — Ambulatory Visit: Payer: Medicare Other | Admitting: Cardiology

## 2016-01-09 ENCOUNTER — Encounter: Payer: Medicare Other | Admitting: Gastroenterology

## 2016-01-10 ENCOUNTER — Telehealth: Payer: Self-pay | Admitting: *Deleted

## 2016-01-10 MED ORDER — OXYCODONE HCL 20 MG PO TABS: 20.0000 mg | ORAL_TABLET | ORAL | 0 refills | Status: DC | PRN

## 2016-01-10 NOTE — Telephone Encounter (Signed)
rec'd call pt requesting refill on his Oxycodone...[name]

## 2016-01-10 NOTE — Telephone Encounter (Signed)
Pt has been made aware rx ready for pick-up...[name]

## 2016-01-22 ENCOUNTER — Other Ambulatory Visit: Payer: Self-pay | Admitting: Cardiovascular Disease

## 2016-01-22 NOTE — Telephone Encounter (Signed)
Rx request sent to pharmacy.  

## 2016-01-24 NOTE — Progress Notes (Signed)
HPI: FU CAD; s/p NSTEMI followed by CABG 10/2008 (L-LAD, S-OM1/L PL br, S-RCA), AAA, carotid stenosis. Beta blocker previously held due to Mobitz Type 1. Patient had atrial flutter ablation in September 2016. Dr. Caryl Comes ordered a Holter monitor that showed significant pauses and heart block but patient was asleep and conservative management felt indicated. Since last seen, the patient has dyspnea with more extreme activities but not with routine activities. It is relieved with rest. It is not associated with chest pain. There is no orthopnea, PND or pedal edema. There is no syncope or palpitations. There is no exertional chest pain.  Studies:  - LHC (09/2008): 3 v CAD, EF 65% => CABG  - Echo (10/17): Normal LV function, Mild aortic stenosis with mean gradient 11 mmHg, mild left atrial enlargement. - Carotid US (Q000111Q): RICA 123456; LICA 123456 - f/u 2 years  - Abdominal Ultrasound 7/17 showed dilatation of 3 x 3.1 cm. Follow-up recommended 1 year.  Current Outpatient Prescriptions  Medication Sig Dispense Refill  . aspirin EC 81 MG tablet Take 1 tablet (81 mg total) by mouth daily.    [name] atorvastatin (LIPITOR) 40 MG tablet TAKE 1 TABLET (40 MG TOTAL) BY MOUTH DAILY AT 6 PM. 90 tablet 3  . buPROPion (WELLBUTRIN XL) 150 MG 24 hr tablet Take 150 mg by mouth daily. Reported on 06/12/2015  3  . finasteride (PROSCAR) 5 MG tablet Take 1 tablet (5 mg total) by mouth every evening. 90 tablet 1  . fluticasone (FLONASE) 50 MCG/ACT nasal spray PLACE 1 SPRAY INTO BOTH NOSTRILS EVERY MORNING. 16 g 3  . furosemide (LASIX) 40 MG tablet TAKE 1 AND 1/2 TABLETS BY MOUTH ONCE DAILY 90 tablet 2  . guaiFENesin (MUCINEX) 600 MG 12 hr tablet Take 1,200 mg by mouth 2 (two) times daily.      . hydrALAZINE (APRESOLINE) 10 MG tablet Take 1 tablet (10 mg total) by mouth 2 (two) times daily. 180 tablet 3  . KLOR-CON M20 20 MEQ tablet TAKE 2 TABLETS (40 MEQ TOTAL) BY MOUTH DAILY. EVERY EVENING 180 tablet 3  . losartan  (COZAAR) 100 MG tablet TAKE 1 TABLET (100 MG TOTAL) BY MOUTH DAILY. EVERY EVENING 90 tablet 3  . nystatin ointment (MYCOSTATIN) APPLY TO AFFECTED AREA TWICE A DAY 30 g 0  . Oxycodone HCl 20 MG TABS Take 1-2 tablets (20-40 mg total) by mouth every 4 (four) hours as needed. 180 each 0  . polyethylene glycol (MIRALAX / GLYCOLAX) packet TAKE 17 G BY MOUTH DAILY. AS DIRECTED 28 packet 7  . polyethylene glycol powder (GLYCOLAX/MIRALAX) powder Take 17 g by mouth 2 (two) times daily as needed. 850 g 1  . sertraline (ZOLOFT) 25 MG tablet Take 1 tablet (25 mg total) by mouth at bedtime. 30 tablet 5  . tamsulosin (FLOMAX) 0.4 MG CAPS capsule TAKE 1 CAPSULE (0.4 MG TOTAL) BY MOUTH DAILY AFTER SUPPER. 90 capsule 1  . triamcinolone cream (KENALOG) 0.1 % APPLY TO AFFECTED AREA TWICE A DAY 30 g 0  . VENTOLIN HFA 108 (90 BASE) MCG/ACT inhaler INHALE 2 PUFFS INTO THE LUNGS EVERY 6 HOURS AS NEEDED FOR WHEEZING. 3 Inhaler 2   No current facility-administered medications for this visit.      Past Medical History:  Diagnosis Date  . AAA (abdominal aortic aneurysm) ([place])   . AAA (abdominal aortic aneurysm) ([place])   . Blindness of left eye   . 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  CAD (coronary artery disease)    s/p CABG 10/2008  . Carotid stenosis   . Cerebrovascular disease   . CHF (congestive heart failure) ([place])   . Cirrhosis ([place])    on CT a/p 07/2013, no longer drinking  . CONGENITAL UNSPEC REDUCTION DEFORMITY LOWER LIMB   . Constipation due to opioid therapy 2014   began about a year ago  . COPD (chronic obstructive pulmonary disease) ([place])   . Foley catheter in place   . HTN (hypertension)   . Hyperlipidemia   . 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 Neuromuscular disorder ([place])    PT STATES HE HAS BEEN TOLD HE EITHER HAD POLIO OR CEREBRAL PALSY - STATES HIS LEFT LEG IS SHORTER AND  AFFECTS HIS BALANCE - HE WEARS ELEVATED SHOE -LEFT  . Pain    BACK, LEGS AND ARMS  . Shortness of breath    EXERTION  . TOBACCO USE, QUIT     Past Surgical History:  Procedure Laterality Date  . CORONARY ARTERY BYPASS GRAFT  11/03/2008   Ricard Dillon - x4: left internal mammary artery to the distal left anterior descending, saphemous vein graft to the first circumflex marginal branch with a Y graft sequentiallly to a left posterolateral branch, saphenous vein graft to the distal right coronary artery  . ELECTROPHYSIOLOGIC STUDY N/A 01/18/2015   Procedure: A-Flutter Ablation;  Surgeon: Deboraha Sprang, MD;  Location: [place] [facility];  Service: Cardiovascular;  Laterality: N/A;  . GREEN LIGHT LASER TURP (TRANSURETHRAL RESECTION OF PROSTATE N/A 02/14/2014   Procedure: GREEN LIGHT LASER TURP (TRANSURETHRAL RESECTION OF PROSTATE;  Surgeon: Festus Aloe, MD;  Location: [facility];  Service: Urology;  Laterality: N/A;    Social History   Social History  . Marital status: Widowed    Spouse name: N/A  . Number of children: N/A  . Years of education: N/A   Occupational History  . Not on file.   Social History Main Topics  . Smoking status: Former Research scientist (life sciences)  . Smokeless tobacco: Never Used  . Alcohol use No     Comment: History of heavy alcohol use per pt. Quit many years ago1/1/ 2005  . Drug use: No  . Sexual activity: Not on file   Other Topics Concern  . Not on file   Social History Narrative   lives alone here in [place].  He has 1 son, who lives in the [place], and he has     an uncle, who lives nearby as well.     Family History  Problem Relation Age of Onset  . Cirrhosis Mother     died at 77  . Colon cancer Neg Hx   . Stomach cancer Neg Hx     ROS: no fevers or chills, productive cough, hemoptysis, dysphasia, odynophagia, melena, hematochezia, dysuria, hematuria, rash, seizure activity, orthopnea, PND, pedal edema, claudication. Remaining systems are  negative.  Physical Exam: Well-developed well-nourished in no acute distress.  Skin is warm and dry.  HEENT is normal.  Neck is supple.  Chest is clear to auscultation with normal expansion.  Cardiovascular exam is regular rate and rhythm. 2/6 systolic murmur left sternal border. S2 is not diminished. Abdominal exam nontender or distended. No masses palpated. Extremities show no edema. neuro grossly intact  ECG sinus rhythm at a rate of 63. First-degree AV block. No ST changes  A/P  1 abdominal aortic aneurysm-follow-up abdominal ultrasound July  2018.  2 atrial flutter-status post ablation.  3 aortic stenosis-mild on most recent echocardiogram.  4 carotid artery disease-follow-up carotid Dopplers June 2018.  5 coronary artery disease-continue aspirin and statin.  6 hypertension-blood pressure Elevated. He states he had low blood pressure on hydralazine 25 mg twice a day in the past. I will try to increase hydralazine to 10 mg 3 times a day. Follow blood pressure and adjust regimen as needed.  7 hyperlipidemia-continue statin.  8 history of Mobitz type I second-degree AV block-no syncope. Continue to follow.  Kirk Ruths, MD

## 2016-01-25 ENCOUNTER — Ambulatory Visit (HOSPITAL_COMMUNITY): Payer: Medicare Other | Attending: Internal Medicine

## 2016-01-25 ENCOUNTER — Other Ambulatory Visit: Payer: Self-pay

## 2016-01-25 DIAGNOSIS — D649 Anemia, unspecified: Secondary | ICD-10-CM | POA: Diagnosis not present

## 2016-01-25 DIAGNOSIS — E785 Hyperlipidemia, unspecified: Secondary | ICD-10-CM | POA: Diagnosis not present

## 2016-01-25 DIAGNOSIS — I350 Nonrheumatic aortic (valve) stenosis: Secondary | ICD-10-CM | POA: Diagnosis not present

## 2016-01-25 DIAGNOSIS — I6529 Occlusion and stenosis of unspecified carotid artery: Secondary | ICD-10-CM | POA: Diagnosis not present

## 2016-01-25 DIAGNOSIS — I440 Atrioventricular block, first degree: Secondary | ICD-10-CM | POA: Insufficient documentation

## 2016-01-25 DIAGNOSIS — I119 Hypertensive heart disease without heart failure: Secondary | ICD-10-CM | POA: Diagnosis not present

## 2016-01-25 DIAGNOSIS — I714 Abdominal aortic aneurysm, without rupture: Secondary | ICD-10-CM | POA: Insufficient documentation

## 2016-01-25 DIAGNOSIS — J449 Chronic obstructive pulmonary disease, unspecified: Secondary | ICD-10-CM | POA: Diagnosis not present

## 2016-01-25 DIAGNOSIS — Z87891 Personal history of nicotine dependence: Secondary | ICD-10-CM | POA: Diagnosis not present

## 2016-01-31 ENCOUNTER — Other Ambulatory Visit: Payer: Self-pay | Admitting: *Deleted

## 2016-01-31 ENCOUNTER — Encounter: Payer: Self-pay | Admitting: Cardiology

## 2016-01-31 ENCOUNTER — Ambulatory Visit (INDEPENDENT_AMBULATORY_CARE_PROVIDER_SITE_OTHER): Payer: Medicare Other | Admitting: Cardiology

## 2016-01-31 VITALS — BP 150/66 | HR 63 | Ht 71.0 in | Wt 199.8 lb

## 2016-01-31 DIAGNOSIS — I2510 Atherosclerotic heart disease of native coronary artery without angina pectoris: Secondary | ICD-10-CM

## 2016-01-31 DIAGNOSIS — I350 Nonrheumatic aortic (valve) stenosis: Secondary | ICD-10-CM

## 2016-01-31 DIAGNOSIS — I714 Abdominal aortic aneurysm, without rupture, unspecified: Secondary | ICD-10-CM

## 2016-01-31 DIAGNOSIS — I441 Atrioventricular block, second degree: Secondary | ICD-10-CM

## 2016-01-31 DIAGNOSIS — I6529 Occlusion and stenosis of unspecified carotid artery: Secondary | ICD-10-CM

## 2016-01-31 DIAGNOSIS — I10 Essential (primary) hypertension: Secondary | ICD-10-CM

## 2016-01-31 DIAGNOSIS — I483 Typical atrial flutter: Secondary | ICD-10-CM

## 2016-01-31 MED ORDER — HYDRALAZINE HCL 10 MG PO TABS: 10.0000 mg | ORAL_TABLET | Freq: Three times a day (TID) | ORAL | 3 refills | Status: DC

## 2016-01-31 NOTE — Patient Instructions (Signed)
Your physician wants you to follow-up in: 1 year or sooner if needed. You will receive a reminder letter in the mail two months in advance. If you don't receive a letter, please call our office to schedule the follow-up appointment.  Your physician has recommended you make the following change in your medication: The hydralazine has been changed to 10 mg three times daily.  If you need a refill on your cardiac medications before your next appointment, please call your pharmacy.

## 2016-02-05 ENCOUNTER — Encounter: Payer: Self-pay | Admitting: Internal Medicine

## 2016-02-05 DIAGNOSIS — Z79891 Long term (current) use of opiate analgesic: Secondary | ICD-10-CM | POA: Diagnosis not present

## 2016-02-06 ENCOUNTER — Telehealth: Payer: Self-pay | Admitting: *Deleted

## 2016-02-06 MED ORDER — OXYCODONE HCL 20 MG PO TABS: 20.0000 mg | ORAL_TABLET | ORAL | 0 refills | Status: DC | PRN

## 2016-02-06 NOTE — Telephone Encounter (Signed)
Rec'd call pt is requesting refill on his Oxycodone...[name]

## 2016-02-06 NOTE — Telephone Encounter (Signed)
Notified pt rx ready for p/u...[name]

## 2016-02-06 NOTE — Telephone Encounter (Signed)
Needs UDS, ready for pickup.

## 2016-02-11 ENCOUNTER — Other Ambulatory Visit: Payer: Self-pay | Admitting: *Deleted

## 2016-02-11 MED ORDER — SERTRALINE HCL 25 MG PO TABS: 25.0000 mg | ORAL_TABLET | Freq: Every day | ORAL | 1 refills | Status: DC

## 2016-02-11 MED ORDER — UMECLIDINIUM-VILANTEROL 62.5-25 MCG/INH IN AEPB: 1.0000 | INHALATION_SPRAY | Freq: Every day | RESPIRATORY_TRACT | 1 refills | Status: DC

## 2016-02-11 NOTE — Progress Notes (Signed)
Rec'd fax pt requesting refill on anoro ellipta inhaler. Rx sent to [facility].../lmb

## 2016-02-12 ENCOUNTER — Other Ambulatory Visit: Payer: Self-pay | Admitting: Internal Medicine

## 2016-02-13 ENCOUNTER — Other Ambulatory Visit: Payer: Self-pay | Admitting: Cardiology

## 2016-02-28 ENCOUNTER — Encounter (HOSPITAL_COMMUNITY): Payer: Self-pay | Admitting: Emergency Medicine

## 2016-02-28 ENCOUNTER — Emergency Department (HOSPITAL_COMMUNITY)
Admission: EM | Admit: 2016-02-28 | Discharge: 2016-02-29 | Disposition: A | Discharge status: Home / Self Care | Payer: Medicare Other | Attending: Emergency Medicine | Admitting: Emergency Medicine

## 2016-02-28 DIAGNOSIS — G8929 Other chronic pain: Secondary | ICD-10-CM

## 2016-02-28 DIAGNOSIS — Z7951 Long term (current) use of inhaled steroids: Secondary | ICD-10-CM | POA: Diagnosis not present

## 2016-02-28 DIAGNOSIS — I2510 Atherosclerotic heart disease of native coronary artery without angina pectoris: Secondary | ICD-10-CM | POA: Insufficient documentation

## 2016-02-28 DIAGNOSIS — Z79899 Other long term (current) drug therapy: Secondary | ICD-10-CM | POA: Insufficient documentation

## 2016-02-28 DIAGNOSIS — I509 Heart failure, unspecified: Secondary | ICD-10-CM | POA: Diagnosis not present

## 2016-02-28 DIAGNOSIS — Z791 Long term (current) use of non-steroidal anti-inflammatories (NSAID): Secondary | ICD-10-CM | POA: Diagnosis not present

## 2016-02-28 DIAGNOSIS — Z7982 Long term (current) use of aspirin: Secondary | ICD-10-CM | POA: Diagnosis not present

## 2016-02-28 DIAGNOSIS — M79604 Pain in right leg: Secondary | ICD-10-CM | POA: Diagnosis not present

## 2016-02-28 DIAGNOSIS — J449 Chronic obstructive pulmonary disease, unspecified: Secondary | ICD-10-CM | POA: Diagnosis not present

## 2016-02-28 DIAGNOSIS — M79661 Pain in right lower leg: Secondary | ICD-10-CM | POA: Diagnosis not present

## 2016-02-28 DIAGNOSIS — I252 Old myocardial infarction: Secondary | ICD-10-CM | POA: Insufficient documentation

## 2016-02-28 DIAGNOSIS — I110 Hypertensive heart disease with heart failure: Secondary | ICD-10-CM | POA: Diagnosis not present

## 2016-02-28 DIAGNOSIS — M79662 Pain in left lower leg: Secondary | ICD-10-CM | POA: Diagnosis not present

## 2016-02-28 DIAGNOSIS — M79605 Pain in left leg: Secondary | ICD-10-CM

## 2016-02-28 DIAGNOSIS — Z951 Presence of aortocoronary bypass graft: Secondary | ICD-10-CM | POA: Insufficient documentation

## 2016-02-28 DIAGNOSIS — Z87891 Personal history of nicotine dependence: Secondary | ICD-10-CM | POA: Insufficient documentation

## 2016-02-28 DIAGNOSIS — M79606 Pain in leg, unspecified: Secondary | ICD-10-CM | POA: Diagnosis not present

## 2016-02-28 LAB — I-STAT CHEM 8, ED
BUN: 29 mg/dL — ABNORMAL HIGH (ref 6–20)
Calcium, Ion: 1.08 mmol/L — ABNORMAL LOW (ref 1.15–1.40)
Chloride: 109 mmol/L (ref 101–111)
Creatinine, Ser: 1.6 mg/dL — ABNORMAL HIGH (ref 0.61–1.24)
Glucose, Bld: 122 mg/dL — ABNORMAL HIGH (ref 65–99)
HCT: 32 % — ABNORMAL LOW (ref 39.0–52.0)
Hemoglobin: 10.9 g/dL — ABNORMAL LOW (ref 13.0–17.0)
Potassium: 4.3 mmol/L (ref 3.5–5.1)
Sodium: 141 mmol/L (ref 135–145)
TCO2: 18 mmol/L (ref 0–100)

## 2016-02-28 MED ORDER — OXYCODONE HCL ER 10 MG PO T12A
20.0000 mg | EXTENDED_RELEASE_TABLET | Freq: Two times a day (BID) | ORAL | Status: DC
  Administered 2016-02-28: 20 mg via ORAL
  Filled 2016-02-28: qty 2

## 2016-02-28 MED ORDER — OXYCODONE HCL 5 MG PO TABS: 5.0000 mg | ORAL_TABLET | Freq: Four times a day (QID) | ORAL | 0 refills | Status: DC | PRN

## 2016-02-28 NOTE — Discharge Instructions (Signed)
We are doing you a favor and writing some pain meds - BUT YOU NEED TO SEE YOUR PRIMARY DOCTOR FOR PAIN MEDS. Avoid taking too many pain meds.

## 2016-02-28 NOTE — ED Provider Notes (Signed)
[facility] DEPT Provider Note   CSN: [id number] Arrival date & time: 02/28/16  1933     History   Chief Complaint Chief Complaint  Patient presents with  . chronic leg pain  . out of pain med    HPI [name] is a 75 y.o. male.  HPI Pt comes in with cc of leg pain. Pt has Chronic leg pain and arm pain from arthritis and takes 20 mg oxycodone that he ran out sooner than usual. The pain has gone up recently. Pt denies nausea, emesis, fevers, chills, chest pains, shortness of breath, headaches, abdominal pain, uti like symptoms.   Past Medical History:  Diagnosis Date  . AAA (abdominal aortic aneurysm) ([place])   . AAA (abdominal aortic aneurysm) ([place])   . Blindness of left eye   . 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 CAD (coronary artery disease)    s/p CABG 10/2008  . Carotid stenosis   . Cerebrovascular disease   . CHF (congestive heart failure) ([place])   . Cirrhosis ([place])    on CT a/p 07/2013, no longer drinking  . CONGENITAL UNSPEC REDUCTION DEFORMITY LOWER LIMB   . Constipation due to opioid therapy 2014   began about a year ago  . COPD (chronic obstructive pulmonary disease) ([place])   . Foley catheter in place   . HTN (hypertension)   . Hyperlipidemia   . 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 Neuromuscular disorder ([place])    PT STATES HE HAS BEEN TOLD HE EITHER HAD POLIO OR CEREBRAL PALSY - STATES HIS LEFT LEG IS SHORTER AND AFFECTS HIS BALANCE - HE WEARS ELEVATED SHOE -LEFT  . Pain    BACK, LEGS AND ARMS  . Shortness of breath    EXERTION  . TOBACCO USE, QUIT     Patient Active Problem List   Diagnosis Date Noted  . Atrial flutter ([place]) 11/24/2014  . Aortic stenosis 03/24/2014  . 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 Cirrhosis ([place]) 07/28/2013  . Bladder outlet obstruction 07/28/2013  . Impaired glucose  metabolism   . Chronic back pain   . Benign neoplasm of colon 01/08/2011  . Abdominal aortic aneurysm ([place]) 08/12/2010  . TOBACCO USE, QUIT 04/11/2009  . Occlusion and stenosis of carotid artery 01/08/2009  . Hyperlipidemia 11/13/2008  . ANEMIA 11/13/2008  . Essential hypertension 11/13/2008  . Coronary atherosclerosis 11/13/2008  . COPD exacerbation ([place]) 11/13/2008    Past Surgical History:  Procedure Laterality Date  . CORONARY ARTERY BYPASS GRAFT  11/03/2008   Ricard Dillon - x4: left internal mammary artery to the distal left anterior descending, saphemous vein graft to the first circumflex marginal branch with a Y graft sequentiallly to a left posterolateral branch, saphenous vein graft to the distal right coronary artery  . ELECTROPHYSIOLOGIC STUDY N/A 01/18/2015   Procedure: A-Flutter Ablation;  Surgeon: Deboraha Sprang, MD;  Location: [facility];  Service: Cardiovascular;  Laterality: N/A;  . GREEN LIGHT LASER TURP (TRANSURETHRAL RESECTION OF PROSTATE N/A 02/14/2014   Procedure: GREEN LIGHT LASER TURP (TRANSURETHRAL RESECTION OF PROSTATE;  Surgeon: Festus Aloe, MD;  Location: [facility];  Service: Urology;  Laterality: N/A;       Home Medications    Prior to Admission medications   Medication Sig Start Date End Date Taking? Authorizing Provider  aspirin EC 81 MG tablet Take 1 tablet (81 mg  total) by mouth daily. 02/22/15   Deboraha Sprang, MD  atorvastatin (LIPITOR) 40 MG tablet TAKE 1 TABLET (40 MG TOTAL) BY MOUTH DAILY AT 6 PM. 11/23/15   [name], MD  buPROPion (WELLBUTRIN XL) 150 MG 24 hr tablet Take 150 mg by mouth daily. Reported on 06/12/2015 12/05/14   Historical Provider, MD  finasteride (PROSCAR) 5 MG tablet Take 1 tablet (5 mg total) by mouth every evening. 10/26/15   [name], MD  fluticasone (FLONASE) 50 MCG/ACT nasal spray PLACE 1 SPRAY INTO BOTH NOSTRILS EVERY MORNING. 07/31/15   [name], MD  furosemide (LASIX) 40 MG tablet TAKE 1  AND 1/2 TABLETS BY MOUTH ONCE DAILY 02/13/16   Lelon Perla, MD  guaiFENesin (MUCINEX) 600 MG 12 hr tablet Take 1,200 mg by mouth 2 (two) times daily.      Historical Provider, MD  hydrALAZINE (APRESOLINE) 10 MG tablet Take 1 tablet (10 mg total) by mouth 3 (three) times daily. 01/31/16   Lelon Perla, MD  KLOR-CON M20 20 MEQ tablet TAKE 2 TABLETS (40 MEQ TOTAL) BY MOUTH DAILY. EVERY EVENING 09/18/15   Lelon Perla, MD  losartan (COZAAR) 100 MG tablet TAKE 1 TABLET (100 MG TOTAL) BY MOUTH DAILY. EVERY EVENING 01/22/16   Mihai Croitoru, MD  nystatin ointment (MYCOSTATIN) APPLY TO AFFECTED AREA TWICE A DAY 12/31/15   [name], MD  oxyCODONE (ROXICODONE) 5 MG immediate release tablet Take 1 tablet (5 mg total) by mouth every 6 (six) hours as needed for severe pain. 02/28/16   Varney Biles, MD  polyethylene glycol (MIRALAX / GLYCOLAX) packet TAKE 17 G BY MOUTH DAILY. AS DIRECTED 03/27/15   [name], MD  polyethylene glycol powder (GLYCOLAX/MIRALAX) powder Take 17 g by mouth 2 (two) times daily as needed. 02/28/14   [name], MD  sertraline (ZOLOFT) 25 MG tablet Take 1 tablet (25 mg total) by mouth at bedtime. 02/11/16   [name], MD  tamsulosin (FLOMAX) 0.4 MG CAPS capsule TAKE 1 CAPSULE (0.4 MG TOTAL) BY MOUTH DAILY AFTER SUPPER. 10/04/15   [name], MD  triamcinolone cream (KENALOG) 0.1 % APPLY TO AFFECTED AREA TWICE A DAY 12/31/15   [name], MD  umeclidinium-vilanterol [facility] ELLIPTA) 62.5-25 MCG/INH AEPB Inhale 1 puff into the lungs daily. 02/11/16   [name], MD  VENTOLIN HFA 108 (90 BASE) MCG/ACT inhaler INHALE 2 PUFFS INTO THE LUNGS EVERY 6 HOURS AS NEEDED FOR WHEEZING. 09/12/14   [name], MD    Family History Family History  Problem Relation Age of Onset  . Cirrhosis Mother     died at 18  . Colon cancer Neg Hx   . Stomach cancer Neg Hx     Social History Social History  Substance  Use Topics  . Smoking status: Former Research scientist (life sciences)  . Smokeless tobacco: Never Used  . Alcohol use No     Comment: History of heavy alcohol use per pt. Quit many years ago1/1/ 2005     Allergies   Amlodipine; Hydrocodone; Other; and Tylenol [acetaminophen]   Review of Systems Review of Systems  Constitutional: Negative for activity change and appetite change.  Respiratory: Negative for cough and shortness of breath.   Cardiovascular: Negative for chest pain.  Gastrointestinal: Negative for abdominal pain.  Genitourinary: Negative for dysuria.  Musculoskeletal: Positive for arthralgias and myalgias.     Physical Exam Updated Vital Signs BP 160/75 (BP Location: Right Arm)  Pulse 92   Temp 98 F (36.7 C) (Oral)   Resp 18   SpO2 100%   Physical Exam   ED Treatments / Results  Labs (all labs ordered are listed, but only abnormal results are displayed) Labs Reviewed  I-STAT CHEM 8, ED - Abnormal; Notable for the following:       Result Value   BUN 29 (*)    Creatinine, Ser 1.60 (*)    Glucose, Bld 122 (*)    Calcium, Ion 1.08 (*)    Hemoglobin 10.9 (*)    HCT 32.0 (*)    All other components within normal limits    EKG  EKG Interpretation None       Radiology No results found.  Procedures Procedures (including critical care time)  Medications Ordered in ED Medications  oxyCODONE (OXYCONTIN) 12 hr tablet 20 mg (20 mg Oral Given 02/28/16 2253)     Initial Impression / Assessment and Plan / ED Course  I have reviewed the triage vital signs and the nursing notes.  Pertinent labs & imaging results that were available during my care of the patient were reviewed by me and considered in my medical decision making (see chart for details).  Clinical Course     Pt comes in with chronic joint pain exacerbation. Ran out of the pain meds yday. Taking too many meds. Database shows no polypharmacy or multiple physician usage -so I trust this elderly  gentleman. Advised prompt f/u. Will give some 5 mg norco to go home.  Final Clinical Impressions(s) / ED Diagnoses   Final diagnoses:  Chronic pain of both lower extremities    New Prescriptions New Prescriptions   OXYCODONE (ROXICODONE) 5 MG IMMEDIATE RELEASE TABLET    Take 1 tablet (5 mg total) by mouth every 6 (six) hours as needed for severe pain.     Varney Biles, MD 03/01/16 [phone]

## 2016-02-28 NOTE — ED Notes (Signed)
Bed: [facility] Expected date:  Expected time:  Means of arrival:  Comments: EMS chronic leg pain out of meds

## 2016-02-28 NOTE — ED Notes (Signed)
Pt. Noted to have some knee/ leg jerkings . Pt. Claimed that he is experiencing  Withdrawals and in terrible pain, restless.

## 2016-02-28 NOTE — ED Triage Notes (Signed)
Per EMS,pt. From home with complaint of chronic leg pain 10/10 , ran out of oxycodone x2 days. Denied recent fall, alert and oriented x4.

## 2016-03-06 ENCOUNTER — Encounter: Payer: Self-pay | Admitting: Internal Medicine

## 2016-03-06 ENCOUNTER — Ambulatory Visit (INDEPENDENT_AMBULATORY_CARE_PROVIDER_SITE_OTHER): Payer: Medicare Other | Admitting: Internal Medicine

## 2016-03-06 DIAGNOSIS — M549 Dorsalgia, unspecified: Secondary | ICD-10-CM | POA: Diagnosis not present

## 2016-03-06 DIAGNOSIS — I6529 Occlusion and stenosis of unspecified carotid artery: Secondary | ICD-10-CM | POA: Diagnosis not present

## 2016-03-06 DIAGNOSIS — G8929 Other chronic pain: Secondary | ICD-10-CM

## 2016-03-06 MED ORDER — OXYCODONE HCL 20 MG PO TABS: 20.0000 mg | ORAL_TABLET | ORAL | 0 refills | Status: DC

## 2016-03-06 NOTE — Patient Instructions (Addendum)
We have given you the refill of the medicine today with more pills so that you are not running out early.   Don't forget that with the contract we have you are not supposed to get pain medication from other doctors without calling us first. We have given you the copy of the contract in case you forgot.

## 2016-03-06 NOTE — Progress Notes (Signed)
Pre visit review using our clinic review tool, if applicable. No additional management support is needed unless otherwise documented below in the visit note. 

## 2016-03-07 NOTE — Assessment & Plan Note (Signed)
We did review our narcotic contract today and given another copy today. We talked about the fact that he should not take more medication that he is alloted per month as persistently negative UDS screens could cause [place] to stop prescribing medication for him. He understands. Refill with increase amount given today for oxycodone 20 mg 220 pills per month.

## 2016-03-07 NOTE — Progress Notes (Signed)
   Subjective:    Patient ID: [name], male    DOB: 05/31/1940, 75 y.o.   MRN: [id number]  HPI The patient is a 75 YO man coming in for chronic pain. His last UDS was negative for his prescription. He admits to taking more than he was budgeted per month and running out early. He was still taking it within the directions on the bottle but having worse pain. He does agree that this was wrong but he finds that his pain is worsening as he is aging from more arthritis. He is currently out of medication and due for refill in 1-2 days. We did talk about if we increase the medication that this would be his contract with [place] and he is not supposed to break this. Also discussed if he has persistently negative UDS screens we may not be able to prescribe in the future.   Review of Systems  Constitutional: Negative for activity change, appetite change, fatigue, fever and unexpected weight change.  Respiratory: Negative.   Cardiovascular: Negative.   Gastrointestinal: Negative.   Musculoskeletal: Positive for arthralgias, back pain and myalgias. Negative for gait problem, joint swelling and neck pain.  Neurological: Negative.       Objective:   Physical Exam  Constitutional: He appears well-developed and well-nourished.  HENT:  Head: Normocephalic and atraumatic.  Eyes: EOM are normal.  Neck: Normal range of motion.  Cardiovascular: Normal rate and regular rhythm.   Pulmonary/Chest: Effort normal. No respiratory distress. He has no wheezes. He has no rales.  Abdominal: Soft. He exhibits no distension. There is no tenderness. There is no rebound.  Skin: Skin is warm and dry.   Vitals:   03/06/16 1538  BP: 132/64  Pulse: 78  Resp: 20  Temp: 97.9 F (36.6 C)  TempSrc: Oral  SpO2: 96%  Weight: 192 lb (87.1 kg)  Height: 6' (1.829 m)      Assessment & Plan:

## 2016-04-03 ENCOUNTER — Telehealth: Payer: Self-pay | Admitting: *Deleted

## 2016-04-03 MED ORDER — OXYCODONE HCL 20 MG PO TABS: 20.0000 mg | ORAL_TABLET | ORAL | 0 refills | Status: DC

## 2016-04-03 NOTE — Telephone Encounter (Signed)
Rec'd call pt requesting refills on Oxycodone...[name]

## 2016-04-03 NOTE — Telephone Encounter (Signed)
Needs random UDS, okay for pickup. Can we call him in 2 weeks for UDS screen (04/17/16)?

## 2016-04-03 NOTE — Telephone Encounter (Signed)
Notified pt rx ready for pick-up.../lmb 

## 2016-04-06 ENCOUNTER — Other Ambulatory Visit: Payer: Self-pay | Admitting: Internal Medicine

## 2016-04-11 ENCOUNTER — Telehealth: Payer: Self-pay | Admitting: Internal Medicine

## 2016-04-11 NOTE — Telephone Encounter (Signed)
Pt called in and said that his foot is swollen

## 2016-04-18 ENCOUNTER — Other Ambulatory Visit: Payer: Self-pay | Admitting: Internal Medicine

## 2016-04-21 ENCOUNTER — Other Ambulatory Visit: Payer: Self-pay | Admitting: Internal Medicine

## 2016-04-26 ENCOUNTER — Other Ambulatory Visit: Payer: Self-pay | Admitting: Internal Medicine

## 2016-04-30 ENCOUNTER — Other Ambulatory Visit: Payer: Self-pay | Admitting: Internal Medicine

## 2016-05-01 ENCOUNTER — Telehealth: Payer: Self-pay | Admitting: Internal Medicine

## 2016-05-01 NOTE — Telephone Encounter (Signed)
Patient requesting refill on oxycodone °

## 2016-05-02 MED ORDER — OXYCODONE HCL 20 MG PO TABS: 20.0000 mg | ORAL_TABLET | ORAL | 0 refills | Status: DC

## 2016-05-02 NOTE — Telephone Encounter (Signed)
Printed and signed, needs UDS at pickup.  

## 2016-05-05 ENCOUNTER — Encounter: Payer: Self-pay | Admitting: Internal Medicine

## 2016-05-05 DIAGNOSIS — Z79891 Long term (current) use of opiate analgesic: Secondary | ICD-10-CM | POA: Diagnosis not present

## 2016-05-14 NOTE — Progress Notes (Signed)
Pre visit review using our clinic review tool, if applicable. No additional management support is needed unless otherwise documented below in the visit note. 

## 2016-05-14 NOTE — Progress Notes (Signed)
Subjective:   [name] is a 76 y.o. male who presents for Medicare Annual/Subsequent preventive examination.  The Patient was informed that the wellness visit is to identify future health risk and educate and initiate measures that can reduce risk for increased disease through the lifespan.    Review of Systems:  No ROS.  Medicare Wellness Visit.  Cardiac Risk Factors include: advanced age (>61men, >23 women);dyslipidemia;sedentary lifestyle;male gender;hypertension;family history of premature cardiovascular disease Sleep patterns:  Sleep 2-3 hour, then able to go back to sleep. Patient states he feels rested. Home Safety/Smoke Alarms: Smoke detectors and security in place.   Living environment; residence and Firearm Safety: no fire arms, Lives alone in trailer, 3 steps with Manufacturing engineer Safety/Bike Helmet: Wears seat belt.    Counseling:   Eye Exam-  none  Dental-  No teeth. Gum care discussed  Male:   CCS-colonoscopy 01/08/2011, polyp. Recall 5 years. Orders placed    PSA-09/2015, 2.11. Followed by [facility].       Objective:    Vitals: BP (!) 122/55 (BP Location: Left Arm, Patient Position: Sitting, Cuff Size: Normal)   Pulse 67   Ht 6' (1.829 m)   Wt 196 lb (88.9 kg)   SpO2 98%   BMI 26.58 kg/m   Body mass index is 26.58 kg/m.  Tobacco History  Smoking Status  . Former Smoker  Smokeless Tobacco  . Never Used     Counseling given: Not Answered   Past Medical History:  Diagnosis Date  . AAA (abdominal aortic aneurysm) ([place])   . AAA (abdominal aortic aneurysm) ([place])   . Blindness of left eye   . 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 CAD (coronary artery disease)    s/p CABG 10/2008  . Carotid stenosis   . Cerebrovascular disease   . CHF (congestive heart failure) ([place])   . Cirrhosis ([place])    on CT a/p 07/2013, no longer drinking  . CONGENITAL UNSPEC REDUCTION DEFORMITY LOWER LIMB   . Constipation due to  opioid therapy 2014   began about a year ago  . COPD (chronic obstructive pulmonary disease) ([place])   . Foley catheter in place   . HTN (hypertension)   . Hyperlipidemia   . 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 Neuromuscular disorder ([place])    PT STATES HE HAS BEEN TOLD HE EITHER HAD POLIO OR CEREBRAL PALSY - STATES HIS LEFT LEG IS SHORTER AND AFFECTS HIS BALANCE - HE WEARS ELEVATED SHOE -LEFT  . Pain    BACK, LEGS AND ARMS  . Shortness of breath    EXERTION  . TOBACCO USE, QUIT    Past Surgical History:  Procedure Laterality Date  . CORONARY ARTERY BYPASS GRAFT  11/03/2008   Ricard Dillon - x4: left internal mammary artery to the distal left anterior descending, saphemous vein graft to the first circumflex marginal branch with a Y graft sequentiallly to a left posterolateral branch, saphenous vein graft to the distal right coronary artery  . ELECTROPHYSIOLOGIC STUDY N/A 01/18/2015   Procedure: A-Flutter Ablation;  Surgeon: Deboraha Sprang, MD;  Location: [facility];  Service: Cardiovascular;  Laterality: N/A;  . GREEN LIGHT LASER TURP (TRANSURETHRAL RESECTION OF PROSTATE N/A 02/14/2014   Procedure: GREEN LIGHT LASER TURP (TRANSURETHRAL RESECTION OF PROSTATE;  Surgeon: Festus Aloe, MD;  Location: [facility];  Service: Urology;  Laterality: N/A;  Family History  Problem Relation Age of Onset  . Cirrhosis Mother     died at 72  . Colon cancer Neg Hx   . Stomach cancer Neg Hx    History  Sexual Activity  . Sexual activity: No    Outpatient Encounter Prescriptions as of 05/15/2016  Medication Sig  . aspirin EC 81 MG tablet Take 1 tablet (81 mg total) by mouth daily.  [name] atorvastatin (LIPITOR) 40 MG tablet TAKE 1 TABLET (40 MG TOTAL) BY MOUTH DAILY AT 6 PM.  . finasteride (PROSCAR) 5 MG tablet TAKE 1 TABLET (5 MG TOTAL) BY MOUTH EVERY EVENING.  . fluticasone (FLONASE) 50 MCG/ACT nasal spray PLACE 1 SPRAY  INTO BOTH NOSTRILS EVERY MORNING.  . furosemide (LASIX) 40 MG tablet TAKE 1 AND 1/2 TABLETS BY MOUTH ONCE DAILY  . guaiFENesin (MUCINEX) 600 MG 12 hr tablet Take 1,200 mg by mouth 2 (two) times daily.    . hydrALAZINE (APRESOLINE) 10 MG tablet Take 1 tablet (10 mg total) by mouth 3 (three) times daily.  [name] KLOR-CON M20 20 MEQ tablet TAKE 2 TABLETS (40 MEQ TOTAL) BY MOUTH DAILY. EVERY EVENING  . losartan (COZAAR) 100 MG tablet TAKE 1 TABLET (100 MG TOTAL) BY MOUTH DAILY. EVERY EVENING  . nystatin ointment (MYCOSTATIN) APPLY TO AFFECTED AREA TWICE A DAY  . Oxycodone HCl 20 MG TABS Take 1-2 tablets (20-40 mg total) by mouth every 4 (four) hours.  . polyethylene glycol (MIRALAX / GLYCOLAX) packet TAKE 17 G BY MOUTH DAILY. AS DIRECTED  . sertraline (ZOLOFT) 25 MG tablet Take 1 tablet (25 mg total) by mouth at bedtime.  . tamsulosin (FLOMAX) 0.4 MG CAPS capsule TAKE 1 CAPSULE BY MOUTH DAILY AFTER SUPPER.  [name] triamcinolone cream (KENALOG) 0.1 % APPLY TO AFFECTED AREA TWICE A DAY  . umeclidinium-vilanterol (ANORO ELLIPTA) 62.5-25 MCG/INH AEPB Inhale 1 puff into the lungs daily.  . VENTOLIN HFA 108 (90 Base) MCG/ACT inhaler INHALE 2 PUFFS INTO THE LUNGS EVERY 6 HOURS AS NEEDED FOR WHEEZING.  [name] buPROPion (WELLBUTRIN XL) 150 MG 24 hr tablet Take 150 mg by mouth daily. Reported on 06/12/2015  . [DISCONTINUED] polyethylene glycol powder (GLYCOLAX/MIRALAX) powder Take 17 g by mouth 2 (two) times daily as needed. (Patient not taking: Reported on 03/06/2016)   No facility-administered encounter medications on file as of 05/15/2016.     Activities of Daily Living In your present state of health, do you have any difficulty performing the following activities: 05/15/2016  Hearing? N  Vision? N  Difficulty concentrating or making decisions? N  Walking or climbing stairs? Y  Dressing or bathing? Y  Doing errands, shopping? N  Preparing Food and eating ? N  Using the Toilet? N  In the past six months, have you  accidently leaked urine? Y  Do you have problems with loss of bowel control? N  Managing your Medications? N  Managing your Finances? N  Housekeeping or managing your Housekeeping? N  Some recent data might be hidden    Patient Care Team: [name], MD as PCP - General (Internal Medicine) Lelon Perla, MD as Consulting Physician (Cardiology) Rexene Alberts, MD as Consulting Physician (Cardiothoracic Surgery) Rolan Bucco, MD (Urology)   Assessment:    Physical assessment deferred to PCP.  Exercise Activities and Dietary recommendations Current Exercise Habits: The patient does not participate in regular exercise at present, Exercise limited by: cardiac condition(s);respiratory conditions(s);orthopedic condition(s)  Diet (meal preparation, eat out, water intake, caffeinated beverages, dairy  products, fruits and vegetables): Drinks 5 -6 Dr. Samson Frederic per day.   Breakfast:skips Lunch: chili  Dinner:    Frozen dinners, fruit, vegetables  Discussed importance of not skipping meals. Encouraged to make healthy food choices. Also, encouraged to decrease soda and increase water.  Goals    . <enter goal here>           To be more mobil and do things in the community.      Fall Risk Fall Risk  05/15/2016 12/14/2015 12/13/2015 10/30/2014 04/11/2013  Falls in the past year? No No No No No   Depression Screen PHQ 2/9 Scores 05/15/2016 12/13/2015 10/30/2014 04/11/2013  PHQ - 2 Score 1 0 1 1    Cognitive Function       Ad8 score reviewed for issues:  Issues making decisions: no  Less interest in hobbies / activities: no  Repeats questions, stories (family complaining): no  Trouble using ordinary gadgets (microwave, computer, phone): no  Forgets the month or year:  no  Mismanaging finances:  no  Remembering appts: no  Daily problems with thinking and/or memory: no Ad8 score is= 0     Immunization History  Administered Date(s) Administered  . Influenza  Split 01/20/2012  . Influenza Whole 01/19/2009, 01/07/2010  . Influenza, High Dose Seasonal PF 01/19/2013, 12/29/2014  . Influenza,inj,Quad PF,36+ Mos 12/22/2013, 12/13/2015  . Pneumococcal Conjugate-13 10/30/2014  . Pneumococcal Polysaccharide-23 01/19/2009  . Td 04/03/2012  . Zoster 01/19/2015   Screening Tests Health Maintenance  Topic Date Due  . COLONOSCOPY  01/06/2016  . TETANUS/TDAP  04/03/2022  . INFLUENZA VACCINE  Completed  . ZOSTAVAX  Completed  . PNA vac Low Risk Adult  Completed      Plan:  Eat heart healthy diet (full of fruits, vegetables, whole grains, lean protein, water--limit salt, fat, and sugar intake) and increase physical activity as tolerated.  Continue doing brain stimulating activities (puzzles, reading, adult coloring books, staying active) to keep memory sharp.   Bring a copy of your advance directives to your next office visit.  Schedule colonoscopy  During the course of the visit the patient was educated and counseled about the following appropriate screening and preventive services:   Vaccines to include Pneumoccal, Influenza, Hepatitis B, Td, Zostavax, HCV  Cardiovascular Disease  Colorectal cancer screening  Diabetes screening  Prostate Cancer Screening  Glaucoma screening  Nutrition counseling  Patient Instructions (the written plan) was given to the patient.    Gerilyn Nestle, RN  05/15/2016

## 2016-05-15 ENCOUNTER — Ambulatory Visit (INDEPENDENT_AMBULATORY_CARE_PROVIDER_SITE_OTHER): Payer: Medicare Other

## 2016-05-15 VITALS — BP 122/55 | HR 67 | Ht 72.0 in | Wt 196.0 lb

## 2016-05-15 DIAGNOSIS — Z1211 Encounter for screening for malignant neoplasm of colon: Secondary | ICD-10-CM

## 2016-05-15 DIAGNOSIS — Z0000 Encounter for general adult medical examination without abnormal findings: Secondary | ICD-10-CM | POA: Diagnosis not present

## 2016-05-15 NOTE — Patient Instructions (Addendum)
Eat heart healthy diet (full of fruits, vegetables, whole grains, lean protein, water--limit salt, fat, and sugar intake) and increase physical activity as tolerated.  Continue doing brain stimulating activities (puzzles, reading, adult coloring books, staying active) to keep memory sharp.   Bring a copy of your advance directives to your next office visit.  Schedule colonoscopy  Colonoscopy, Adult A colonoscopy is an exam to look at the entire large intestine. During the exam, a lubricated, bendable tube is inserted into the anus and then passed into the rectum, colon, and other parts of the large intestine. A colonoscopy is often done as a part of normal colorectal screening or in response to certain symptoms, such as anemia, persistent diarrhea, abdominal pain, and blood in the stool. The exam can help screen for and diagnose medical problems, including:  Tumors.  Polyps.  Inflammation.  Areas of bleeding. Tell a health care provider about:  Any allergies you have.  All medicines you are taking, including vitamins, herbs, eye drops, creams, and over-the-counter medicines.  Any problems you or family members have had with anesthetic medicines.  Any blood disorders you have.  Any surgeries you have had.  Any medical conditions you have.  Any problems you have had passing stool. What are the risks? Generally, this is a safe procedure. However, problems may occur, including:  Bleeding.  A tear in the intestine.  A reaction to medicines given during the exam.  Infection (rare). What happens before the procedure? Eating and drinking restrictions  Follow instructions from your health care provider about eating and drinking, which may include:  A few days before the procedure - follow a low-fiber diet. Avoid nuts, seeds, dried fruit, raw fruits, and vegetables.  1-3 days before the procedure - follow a clear liquid diet. Drink only clear liquids, such as clear broth or  bouillon, black coffee or tea, clear juice, clear soft drinks or sports drinks, gelatin desert, and popsicles. Avoid any liquids that contain red or purple dye.  On the day of the procedure - do not eat or drink anything during the 2 hours before the procedure, or within the time period that your health care provider recommends. Bowel prep  If you were prescribed an oral bowel prep to clean out your colon:  Take it as told by your health care provider. Starting the day before your procedure, you will need to drink a large amount of medicated liquid. The liquid will cause you to have multiple loose stools until your stool is almost clear or light green.  If your skin or anus gets irritated from diarrhea, you may use these to relieve the irritation:  Medicated wipes, such as adult wet wipes with aloe and vitamin E.  A skin soothing-product like petroleum jelly.  If you vomit while drinking the bowel prep, take a break for up to 60 minutes and then begin the bowel prep again. If vomiting continues and you cannot take the bowel prep without vomiting, call your health care provider. General instructions  Ask your health care provider about changing or stopping your regular medicines. This is especially important if you are taking diabetes medicines or blood thinners.  Plan to have someone take you home from the hospital or clinic. What happens during the procedure?  An IV tube may be inserted into one of your veins.  You will be given medicine to help you relax (sedative).  To reduce your risk of infection:  Your health care team will wash or sanitize  their hands.  Your anal area will be washed with soap.  You will be asked to lie on your side with your knees bent.  Your health care provider will lubricate a long, thin, flexible tube. The tube will have a camera and a light on the end.  The tube will be inserted into your anus.  The tube will be gently eased through your rectum and  colon.  Air will be delivered into your colon to keep it open. You may feel some pressure or cramping.  The camera will be used to take images during the procedure.  A small tissue sample may be removed from your body to be examined under a microscope (biopsy). If any potential problems are found, the tissue will be sent to a lab for testing.  If small polyps are found, your health care provider may remove them and have them checked for cancer cells.  The tube that was inserted into your anus will be slowly removed. The procedure may vary among health care providers and hospitals. What happens after the procedure?  Your blood pressure, heart rate, breathing rate, and blood oxygen level will be monitored until the medicines you were given have worn off.  Do not drive for 24 hours after the exam.  You may have a small amount of blood in your stool.  You may pass gas and have mild abdominal cramping or bloating due to the air that was used to inflate your colon during the exam.  It is up to you to get the results of your procedure. Ask your health care provider, or the department performing the procedure, when your results will be ready. This information is not intended to replace advice given to you by your health care provider. Make sure you discuss any questions you have with your health care provider. Document Released: 04/04/2000 Document Revised: 10/26/2015 Document Reviewed: 06/19/2015 Elsevier Interactive Patient Education  2017 [employer] Prevention in the Home Introduction Falls can cause injuries. They can happen to people of all ages. There are many things you can do to make your home safe and to help prevent falls. What can I do on the outside of my home?  Regularly fix the edges of walkways and driveways and fix any cracks.  Remove anything that might make you trip as you walk through a door, such as a raised step or threshold.  Trim any bushes or trees on  the path to your home.  Use bright outdoor lighting.  Clear any walking paths of anything that might make someone trip, such as rocks or tools.  Regularly check to see if handrails are loose or broken. Make sure that both sides of any steps have handrails.  Any raised decks and porches should have guardrails on the edges.  Have any leaves, snow, or ice cleared regularly.  Use sand or salt on walking paths during winter.  Clean up any spills in your garage right away. This includes oil or grease spills. What can I do in the bathroom?  Use night lights.  Install grab bars by the toilet and in the tub and shower. Do not use towel bars as grab bars.  Use non-skid mats or decals in the tub or shower.  If you need to sit down in the shower, use a plastic, non-slip stool.  Keep the floor dry. Clean up any water that spills on the floor as soon as it happens.  Remove soap buildup in the tub  or shower regularly.  Attach bath mats securely with double-sided non-slip rug tape.  Do not have throw rugs and other things on the floor that can make you trip. What can I do in the bedroom?  Use night lights.  Make sure that you have a light by your bed that is easy to reach.  Do not use any sheets or blankets that are too big for your bed. They should not hang down onto the floor.  Have a firm chair that has side arms. You can use this for support while you get dressed.  Do not have throw rugs and other things on the floor that can make you trip. What can I do in the kitchen?  Clean up any spills right away.  Avoid walking on wet floors.  Keep items that you use a lot in easy-to-reach places.  If you need to reach something above you, use a strong step stool that has a grab bar.  Keep electrical cords out of the way.  Do not use floor polish or wax that makes floors slippery. If you must use wax, use non-skid floor wax.  Do not have throw rugs and other things on the floor that  can make you trip. What can I do with my stairs?  Do not leave any items on the stairs.  Make sure that there are handrails on both sides of the stairs and use them. Fix handrails that are broken or loose. Make sure that handrails are as long as the stairways.  Check any carpeting to make sure that it is firmly attached to the stairs. Fix any carpet that is loose or worn.  Avoid having throw rugs at the top or bottom of the stairs. If you do have throw rugs, attach them to the floor with carpet tape.  Make sure that you have a light switch at the top of the stairs and the bottom of the stairs. If you do not have them, ask someone to add them for you. What else can I do to help prevent falls?  Wear shoes that:  Do not have high heels.  Have rubber bottoms.  Are comfortable and fit you well.  Are closed at the toe. Do not wear sandals.  If you use a stepladder:  Make sure that it is fully opened. Do not climb a closed stepladder.  Make sure that both sides of the stepladder are locked into place.  Ask someone to hold it for you, if possible.  Clearly mark and make sure that you can see:  Any grab bars or handrails.  First and last steps.  Where the edge of each step is.  Use tools that help you move around (mobility aids) if they are needed. These include:  Canes.  Walkers.  Scooters.  Crutches.  Turn on the lights when you go into a dark area. Replace any light bulbs as soon as they burn out.  Set up your furniture so you have a clear path. Avoid moving your furniture around.  If any of your floors are uneven, fix them.  If there are any pets around you, be aware of where they are.  Review your medicines with your doctor. Some medicines can make you feel dizzy. This can increase your chance of falling. Ask your doctor what other things that you can do to help prevent falls. This information is not intended to replace advice given to you by your health care  provider. Make sure you discuss  any questions you have with your health care provider. Document Released: 02/01/2009 Document Revised: 09/13/2015 Document Reviewed: 05/12/2014  2017 Elsevier  Health Maintenance, Male A healthy lifestyle and preventative care can promote health and wellness.  Maintain regular health, dental, and eye exams.  Eat a healthy diet. Foods like vegetables, fruits, whole grains, low-fat dairy products, and lean protein foods contain the nutrients you need and are low in calories. Decrease your intake of foods high in solid fats, added sugars, and salt. Get information about a proper diet from your health care provider, if necessary.  Regular physical exercise is one of the most important things you can do for your health. Most adults should get at least 150 minutes of moderate-intensity exercise (any activity that increases your heart rate and causes you to sweat) each week. In addition, most adults need muscle-strengthening exercises on 2 or more days a week.   Maintain a healthy weight. The body mass index (BMI) is a screening tool to identify possible weight problems. It provides an estimate of body fat based on height and weight. Your health care provider can find your BMI and can help you achieve or maintain a healthy weight. For males 20 years and older:  A BMI below 18.5 is considered underweight.  A BMI of 18.5 to 24.9 is normal.  A BMI of 25 to 29.9 is considered overweight.  A BMI of 30 and above is considered obese.  Maintain normal blood lipids and cholesterol by exercising and minimizing your intake of saturated fat. Eat a balanced diet with plenty of fruits and vegetables. Blood tests for lipids and cholesterol should begin at age 26 and be repeated every 5 years. If your lipid or cholesterol levels are high, you are over age 73, or you are at high risk for heart disease, you may need your cholesterol levels checked more frequently.Ongoing high lipid  and cholesterol levels should be treated with medicines if diet and exercise are not working.  If you smoke, find out from your health care provider how to quit. If you do not use tobacco, do not start.  Lung cancer screening is recommended for adults aged 69-80 years who are at high risk for developing lung cancer because of a history of smoking. A yearly low-dose CT scan of the lungs is recommended for people who have at least a 30-pack-year history of smoking and are current smokers or have quit within the past 15 years. A pack year of smoking is smoking an average of 1 pack of cigarettes a day for 1 year (for example, a 30-pack-year history of smoking could mean smoking 1 pack a day for 30 years or 2 packs a day for 15 years). Yearly screening should continue until the smoker has stopped smoking for at least 15 years. Yearly screening should be stopped for people who develop a health problem that would prevent them from having lung cancer treatment.  If you choose to drink alcohol, do not have more than 2 drinks per day. One drink is considered to be 12 oz (360 mL) of beer, 5 oz (150 mL) of wine, or 1.5 oz (45 mL) of liquor.  Avoid the use of street drugs. Do not share needles with anyone. Ask for help if you need support or instructions about stopping the use of drugs.  High blood pressure causes heart disease and increases the risk of stroke. High blood pressure is more likely to develop in:  People who have blood pressure in the  end of the normal range (100-139/85-89 mm Hg).  People who are overweight or obese.  People who are African American.  If you are 43-58 years of age, have your blood pressure checked every 3-5 years. If you are 21 years of age or older, have your blood pressure checked every year. You should have your blood pressure measured twice-once when you are at a hospital or clinic, and once when you are not at a hospital or clinic. Record the average of the two measurements.  To check your blood pressure when you are not at a hospital or clinic, you can use:  An automated blood pressure machine at a pharmacy.  A home blood pressure monitor.  If you are 50-76 years old, ask your health care provider if you should take aspirin to prevent heart disease.  Diabetes screening involves taking a blood sample to check your fasting blood sugar level. This should be done once every 3 years after age 53 if you are at a normal weight and without risk factors for diabetes. Testing should be considered at a younger age or be carried out more frequently if you are overweight and have at least 1 risk factor for diabetes.  Colorectal cancer can be detected and often prevented. Most routine colorectal cancer screening begins at the age of 49 and continues through age 58. However, your health care provider may recommend screening at an earlier age if you have risk factors for colon cancer. On a yearly basis, your health care provider may provide home test kits to check for hidden blood in the stool. A small camera at the end of a tube may be used to directly examine the colon (sigmoidoscopy or colonoscopy) to detect the earliest forms of colorectal cancer. Talk to your health care provider about this at age 45 when routine screening begins. A direct exam of the colon should be repeated every 5-10 years through age 21, unless early forms of precancerous polyps or small growths are found.  People who are at an increased risk for hepatitis B should be screened for this virus. You are considered at high risk for hepatitis B if:  You were born in a country where hepatitis B occurs often. Talk with your health care provider about which countries are considered high risk.  Your parents were born in a high-risk country and you have not received a shot to protect against hepatitis B (hepatitis B vaccine).  You have HIV or AIDS.  You use needles to inject street drugs.  You live with, or have  sex with, someone who has hepatitis B.  You are a man who has sex with other men (MSM).  You get hemodialysis treatment.  You take certain medicines for conditions like cancer, organ transplantation, and autoimmune conditions.  Hepatitis C blood testing is recommended for all people born from [birth year] through 1963 and any individual with known risk factors for hepatitis C.  Healthy men should no longer receive prostate-specific antigen (PSA) blood tests as part of routine cancer screening. Talk to your health care provider about prostate cancer screening.  Testicular cancer screening is not recommended for adolescents or adult males who have no symptoms. Screening includes self-exam, a health care provider exam, and other screening tests. Consult with your health care provider about any symptoms you have or any concerns you have about testicular cancer.  Practice safe sex. Use condoms and avoid high-risk sexual practices to reduce the spread of sexually transmitted infections (STIs).  You should  be screened for STIs, including gonorrhea and chlamydia if:  You are sexually active and are younger than 24 years.  You are older than 24 years, and your health care provider tells you that you are at risk for this type of infection.  Your sexual activity has changed since you were last screened, and you are at an increased risk for chlamydia or gonorrhea. Ask your health care provider if you are at risk.  If you are at risk of being infected with HIV, it is recommended that you take a prescription medicine daily to prevent HIV infection. This is called pre-exposure prophylaxis (PrEP). You are considered at risk if:  You are a man who has sex with other men (MSM).  You are a heterosexual man who is sexually active with multiple partners.  You take drugs by injection.  You are sexually active with a partner who has HIV.  Talk with your health care provider about whether you are at high risk of  being infected with HIV. If you choose to begin PrEP, you should first be tested for HIV. You should then be tested every 3 months for as long as you are taking PrEP.  Use sunscreen. Apply sunscreen liberally and repeatedly throughout the day. You should seek shade when your shadow is shorter than you. Protect yourself by wearing long sleeves, pants, a wide-brimmed hat, and sunglasses year round whenever you are outdoors.  Tell your health care provider of new moles or changes in moles, especially if there is a change in shape or color. Also, tell your health care provider if a mole is larger than the size of a pencil eraser.  A one-time screening for abdominal aortic aneurysm (AAA) and surgical repair of large AAAs by ultrasound is recommended for men aged 51-75 years who are current or former smokers.  Stay current with your vaccines (immunizations). This information is not intended to replace advice given to you by your health care provider. Make sure you discuss any questions you have with your health care provider. Document Released: 10/04/2007 Document Revised: 04/28/2014 Document Reviewed: 01/09/2015 Elsevier Interactive Patient Education  2017 [employer].

## 2016-05-16 NOTE — Progress Notes (Signed)
Medical screening examination/treatment/procedure(s) were performed by non-physician practitioner and as supervising physician I was immediately available for consultation/collaboration. I agree with above. Elizabeth A Crawford, MD 

## 2016-05-29 ENCOUNTER — Ambulatory Visit: Payer: Medicare Other | Admitting: Internal Medicine

## 2016-06-02 ENCOUNTER — Ambulatory Visit: Payer: Medicare Other | Admitting: Internal Medicine

## 2016-06-02 ENCOUNTER — Other Ambulatory Visit: Payer: Self-pay | Admitting: Internal Medicine

## 2016-06-02 ENCOUNTER — Ambulatory Visit (INDEPENDENT_AMBULATORY_CARE_PROVIDER_SITE_OTHER): Payer: Medicare Other | Admitting: Internal Medicine

## 2016-06-02 ENCOUNTER — Encounter: Payer: Self-pay | Admitting: Internal Medicine

## 2016-06-02 VITALS — BP 138/58 | HR 86 | Temp 98.0°F | Ht 72.0 in | Wt 193.0 lb

## 2016-06-02 DIAGNOSIS — M549 Dorsalgia, unspecified: Secondary | ICD-10-CM | POA: Diagnosis not present

## 2016-06-02 DIAGNOSIS — G8929 Other chronic pain: Secondary | ICD-10-CM

## 2016-06-02 MED ORDER — OXYCODONE HCL 20 MG PO TABS: 20.0000 mg | ORAL_TABLET | ORAL | 0 refills | Status: DC

## 2016-06-02 NOTE — Patient Instructions (Addendum)
We will get you in to physical therapy for the pain to see if you qualify for a scooter.   We have refilled the oxycodone today.  If you can get the form for the license plate we can fill that out.

## 2016-06-02 NOTE — Progress Notes (Signed)
Pre visit review using our clinic review tool, if applicable. No additional management support is needed unless otherwise documented below in the visit note. 

## 2016-06-03 ENCOUNTER — Encounter: Payer: Self-pay | Admitting: Internal Medicine

## 2016-06-03 NOTE — Assessment & Plan Note (Signed)
He is willing to go to physical therapy and work on mobility to see if he can increase his stamina or if he qualifies for any mobility aid if he cannot increase stamina. He is still getting benefit from the oxycodone and denies significant side effects.

## 2016-06-03 NOTE — Progress Notes (Signed)
   Subjective:    Patient ID: [name], male    DOB: 07/02/1940, 76 y.o.   MRN: [id number]  HPI The patient is a 76 YO man coming in for follow up but he is having more problems with mobility. He is able to walk around still with pain but he is not able to walk long distances. He is having to stop about 3-4 times when going to get his mail. He has nowhere to stop and sit and wonder if he would qualify for a scooter or something.   Review of Systems  Constitutional: Positive for activity change and fatigue. Negative for appetite change, chills, fever and unexpected weight change.  HENT: Negative.   Respiratory: Positive for cough and shortness of breath. Negative for chest tightness and wheezing.   Cardiovascular: Negative.   Gastrointestinal: Negative.   Musculoskeletal: Positive for arthralgias, back pain, gait problem and myalgias.  Neurological: Negative for dizziness, seizures, weakness, light-headedness and numbness.      Objective:   Physical Exam  Constitutional: He is oriented to person, place, and time. He appears well-developed and well-nourished.  HENT:  Head: Normocephalic and atraumatic.  Eyes: EOM are normal.  Neck: Normal range of motion.  Cardiovascular: Normal rate and regular rhythm.   Pulmonary/Chest: Effort normal. No respiratory distress. He has no wheezes.  Some expiratory wheezing which is stable from prior  Abdominal: Soft.  Neurological: He is alert and oriented to person, place, and time.  Skin: Skin is warm and dry.   Vitals:   06/02/16 0934  BP: (!) 138/58  Pulse: 86  Temp: 98 F (36.7 C)  TempSrc: Oral  SpO2: 98%  Weight: 193 lb (87.5 kg)  Height: 6' (1.829 m)      Assessment & Plan:

## 2016-06-13 ENCOUNTER — Encounter: Payer: Self-pay | Admitting: Gastroenterology

## 2016-06-23 ENCOUNTER — Ambulatory Visit: Payer: Medicare Other | Admitting: Physical Therapy

## 2016-06-27 ENCOUNTER — Telehealth: Payer: Self-pay | Admitting: *Deleted

## 2016-06-27 NOTE — Telephone Encounter (Signed)
Rec'd call pt requesting refill on his Oxycodone. Pt not due until 3/12, will hold until MD return back in the office on Monday for approval.../lmb

## 2016-06-30 ENCOUNTER — Ambulatory Visit: Payer: Medicare Other | Admitting: Physical Therapy

## 2016-06-30 MED ORDER — OXYCODONE HCL 20 MG PO TABS: 20.0000 mg | ORAL_TABLET | ORAL | 0 refills | Status: DC

## 2016-06-30 NOTE — Telephone Encounter (Signed)
Patient called back.  Would like to pick up as soon as possible to avoid driving in bad weather.  Call home number. [phone]

## 2016-06-30 NOTE — Telephone Encounter (Signed)
Patient contacted and stated awareness 

## 2016-06-30 NOTE — Telephone Encounter (Signed)
Printed and signed.  

## 2016-06-30 NOTE — Telephone Encounter (Signed)
Alex called from [facility] stating Oxycodone need to either e-scribe or pt bring in the written Rx.

## 2016-07-29 ENCOUNTER — Telehealth: Payer: Self-pay | Admitting: Internal Medicine

## 2016-07-29 MED ORDER — OXYCODONE HCL 20 MG PO TABS: 20.0000 mg | ORAL_TABLET | ORAL | 0 refills | Status: DC

## 2016-07-29 NOTE — Telephone Encounter (Signed)
Pt called wanting a refill Oxycodone HCl 20 MG TABS . Please advise.

## 2016-07-29 NOTE — Telephone Encounter (Signed)
Printed and signed.  

## 2016-07-29 NOTE — Telephone Encounter (Signed)
Ready for pickup up front, tried contacting patient and keep getting message saying number has been changed

## 2016-08-05 ENCOUNTER — Encounter: Payer: Self-pay | Admitting: Internal Medicine

## 2016-08-05 ENCOUNTER — Ambulatory Visit (INDEPENDENT_AMBULATORY_CARE_PROVIDER_SITE_OTHER): Payer: Medicare Other | Admitting: Internal Medicine

## 2016-08-05 DIAGNOSIS — J410 Simple chronic bronchitis: Secondary | ICD-10-CM

## 2016-08-05 NOTE — Progress Notes (Signed)
Pre visit review using our clinic review tool, if applicable. No additional management support is needed unless otherwise documented below in the visit note. 

## 2016-08-05 NOTE — Progress Notes (Signed)
   Subjective:    Patient ID: [name], male    DOB: [birth year], 76 y.o.   MRN: [id number]  HPI The patient is a 76 YO man coming in for follow up of his breathing. He is struggling slightly more with the pollen being out lately. He is taking chlorocedin BP which helps some. He is still using his flonase which helps as well some. He is using anoro and albuterol when needed. Using that rarely at this time. He denies more SOB with exertion. Some more cough without worsening sputum. No fevers or chills.   Review of Systems  Constitutional: Positive for fatigue. Negative for activity change, appetite change, chills, fever and unexpected weight change.  HENT: Positive for congestion and rhinorrhea. Negative for ear discharge, ear pain, postnasal drip, sinus pain, sinus pressure, sore throat and trouble swallowing.   Eyes: Negative.   Respiratory: Positive for cough. Negative for chest tightness, shortness of breath and wheezing.   Cardiovascular: Negative.   Gastrointestinal: Negative.   Musculoskeletal: Positive for arthralgias, back pain and myalgias.  Neurological: Negative.       Objective:   Physical Exam  Constitutional: He is oriented to person, place, and time. He appears well-developed and well-nourished.  HENT:  Head: Normocephalic and atraumatic.  Right Ear: External ear normal.  Left Ear: External ear normal.  Oropharynx with redness and clear drainage, nose without crusting.   Eyes: EOM are normal. Pupils are equal, round, and reactive to light.  Neck: Normal range of motion. No JVD present.  Cardiovascular: Normal rate and regular rhythm.   Pulmonary/Chest: Effort normal. No respiratory distress. He has no wheezes. He has no rales.  Some scattered rhonchi which partially clear with cough, stable exam from prior.   Abdominal: Soft.  Lymphadenopathy:    He has no cervical adenopathy.  Neurological: He is alert and oriented to person, place, and time.  Skin: Skin is  warm and dry.   Vitals:   08/05/16 1445  BP: (!) 118/54  Pulse: 90  Resp: 16  Temp: 97.8 F (36.6 C)  TempSrc: Oral  SpO2: 98%  Weight: 194 lb (88 kg)  Height: 6' (1.829 m)      Assessment & Plan:

## 2016-08-05 NOTE — Patient Instructions (Signed)
We will be happy to see you friend.

## 2016-08-05 NOTE — Assessment & Plan Note (Signed)
No flare today, continue anoro and albuterol prn. Can continue flonase and advised to add zyrtec if needed and okay to use chlorocedin hbp if this is helping. No indication for steroids or antibiotics today.

## 2016-08-14 ENCOUNTER — Encounter: Payer: Medicare Other | Admitting: Gastroenterology

## 2016-08-19 ENCOUNTER — Other Ambulatory Visit: Payer: Self-pay | Admitting: Internal Medicine

## 2016-08-23 ENCOUNTER — Other Ambulatory Visit: Payer: Self-pay | Admitting: Cardiology

## 2016-08-27 ENCOUNTER — Telehealth: Payer: Self-pay | Admitting: *Deleted

## 2016-08-27 MED ORDER — OXYCODONE HCL 20 MG PO TABS: 20.0000 mg | ORAL_TABLET | ORAL | 0 refills | Status: DC

## 2016-08-27 NOTE — Telephone Encounter (Signed)
Printed and signed.  

## 2016-08-27 NOTE — Telephone Encounter (Signed)
Rec'd call pt requesting refill on his Oxycodone.../lmb 

## 2016-08-27 NOTE — Telephone Encounter (Signed)
Notified pt rx ready for pick-up.../lmb 

## 2016-09-26 ENCOUNTER — Telehealth: Payer: Self-pay | Admitting: *Deleted

## 2016-09-26 MED ORDER — OXYCODONE HCL 20 MG PO TABS: 20.0000 mg | ORAL_TABLET | ORAL | 0 refills | Status: DC

## 2016-09-26 NOTE — Telephone Encounter (Signed)
Rec'd call pt requesting refill on his Oxycodone. MD is out of office pls advise...[name]

## 2016-09-26 NOTE — Telephone Encounter (Signed)
Medication refilled for pick up.

## 2016-09-26 NOTE — Telephone Encounter (Signed)
Notified pt rx ready for pick-up.../lmb 

## 2016-10-07 ENCOUNTER — Ambulatory Visit: Payer: Medicare Other | Admitting: Nurse Practitioner

## 2016-10-08 ENCOUNTER — Ambulatory Visit (INDEPENDENT_AMBULATORY_CARE_PROVIDER_SITE_OTHER): Payer: Medicare Other | Admitting: Nurse Practitioner

## 2016-10-08 ENCOUNTER — Encounter: Payer: Self-pay | Admitting: Nurse Practitioner

## 2016-10-08 VITALS — BP 118/60 | HR 70 | Temp 98.0°F | Ht 72.0 in | Wt 189.0 lb

## 2016-10-08 DIAGNOSIS — G44209 Tension-type headache, unspecified, not intractable: Secondary | ICD-10-CM | POA: Diagnosis not present

## 2016-10-08 DIAGNOSIS — R0981 Nasal congestion: Secondary | ICD-10-CM

## 2016-10-08 MED ORDER — METHYLPREDNISOLONE ACETATE 40 MG/ML IJ SUSP
40.0000 mg | Freq: Once | INTRAMUSCULAR | Status: AC
  Administered 2016-10-08: 40 mg via INTRAMUSCULAR

## 2016-10-08 MED ORDER — CYCLOBENZAPRINE HCL 5 MG PO TABS: 5.0000 mg | ORAL_TABLET | Freq: Every day | ORAL | 0 refills | Status: DC

## 2016-10-08 NOTE — Patient Instructions (Signed)
General Headache Without Cause A headache is pain or discomfort felt around the head or neck area. The specific cause of a headache may not be found. There are many causes and types of headaches. A few common ones are:  Tension headaches.  Migraine headaches.  Cluster headaches.  Chronic daily headaches.  Follow these instructions at home: Watch your condition for any changes. Take these steps to help with your condition: Managing pain  Take over-the-counter and prescription medicines only as told by your health care provider.  Lie down in a dark, quiet room when you have a headache.  If directed, apply ice to the head and neck area: ? Put ice in a plastic bag. ? Place a towel between your skin and the bag. ? Leave the ice on for 20 minutes, 2-3 times per day.  Use a heating pad or hot shower to apply heat to the head and neck area as told by your health care provider.  Keep lights dim if bright lights bother you or make your headaches worse. Eating and drinking  Eat meals on a regular schedule.  Limit alcohol use.  Decrease the amount of caffeine you drink, or stop drinking caffeine. General instructions  Keep all follow-up visits as told by your health care provider. This is important.  Keep a headache journal to help find out what may trigger your headaches. For example, write down: ? What you eat and drink. ? How much sleep you get. ? Any change to your diet or medicines.  Try massage or other relaxation techniques.  Limit stress.  Sit up straight, and do not tense your muscles.  Do not use tobacco products, including cigarettes, chewing tobacco, or e-cigarettes. If you need help quitting, ask your health care provider.  Exercise regularly as told by your health care provider.  Sleep on a regular schedule. Get 7-9 hours of sleep, or the amount recommended by your health care provider. Contact a health care provider if:  Your symptoms are not helped by  medicine.  You have a headache that is different from the usual headache.  You have nausea or you vomit.  You have a fever. Get help right away if:  Your headache becomes severe.  You have repeated vomiting.  You have a stiff neck.  You have a loss of vision.  You have problems with speech.  You have pain in the eye or ear.  You have muscular weakness or loss of muscle control.  You lose your balance or have trouble walking.  You feel faint or pass out.  You have confusion. This information is not intended to replace advice given to you by your health care provider. Make sure you discuss any questions you have with your health care provider. Document Released: 04/07/2005 Document Revised: 09/13/2015 Document Reviewed: 07/31/2014 Elsevier Interactive Patient Education  2017 [employer] Inc.  

## 2016-10-08 NOTE — Progress Notes (Signed)
Subjective:  Patient ID: [name], male    DOB: 04/24/1940  Age: 76 y.o. MRN: [id number]  CC: Headache (headache on the back of the head 1 wk. no tylenol or ibuprofen--orxycodone that will help,)   Headache   This is a new problem. The current episode started in the past 7 days. The problem occurs intermittently. The problem has been waxing and waning. The pain is located in the occipital region. The pain does not radiate. The pain quality is not similar to prior headaches. The quality of the pain is described as dull (tightness). Associated symptoms include sinus pressure. Pertinent negatives include no abnormal behavior, anorexia, blurred vision, dizziness, eye watering, fever, hearing loss, insomnia, loss of balance, nausea, neck pain, numbness, phonophobia, photophobia, rhinorrhea, scalp tenderness, swollen glands, tingling, tinnitus, visual change, vomiting, weakness or weight loss. Nothing aggravates the symptoms. He has tried oral narcotics for the symptoms. His past medical history is significant for hypertension and sinus disease. There is no history of cancer, immunosuppression, migraine headaches, obesity, pseudotumor cerebri, recent head traumas or TMJ.    Outpatient Medications Prior to Visit  Medication Sig Dispense Refill  . aspirin EC 81 MG tablet Take 1 tablet (81 mg total) by mouth daily.    [name] atorvastatin (LIPITOR) 40 MG tablet TAKE 1 TABLET (40 MG TOTAL) BY MOUTH DAILY AT 6 PM. 90 tablet 3  . buPROPion (WELLBUTRIN XL) 150 MG 24 hr tablet Take 150 mg by mouth daily. Reported on 06/12/2015  3  . finasteride (PROSCAR) 5 MG tablet TAKE 1 TABLET (5 MG TOTAL) BY MOUTH EVERY EVENING. 90 tablet 1  . fluticasone (FLONASE) 50 MCG/ACT nasal spray PLACE 1 SPRAY INTO BOTH NOSTRILS EVERY MORNING. 16 g 3  . furosemide (LASIX) 40 MG tablet TAKE 1 AND 1/2 TABLETS BY MOUTH ONCE DAILY 135 tablet 0  . guaiFENesin (MUCINEX) 600 MG 12 hr tablet Take 1,200 mg by mouth 2 (two) times daily.       . hydrALAZINE (APRESOLINE) 10 MG tablet Take 1 tablet (10 mg total) by mouth 3 (three) times daily. 270 tablet 3  . KLOR-CON M20 20 MEQ tablet TAKE 2 TABLETS (40 MEQ TOTAL) BY MOUTH DAILY. EVERY EVENING 180 tablet 3  . losartan (COZAAR) 100 MG tablet TAKE 1 TABLET (100 MG TOTAL) BY MOUTH DAILY. EVERY EVENING 90 tablet 3  . nystatin ointment (MYCOSTATIN) APPLY TO AFFECTED AREA TWICE A DAY 30 g 0  . Oxycodone HCl 20 MG TABS Take 1-2 tablets (20-40 mg total) by mouth every 4 (four) hours. 220 tablet 0  . polyethylene glycol (MIRALAX / GLYCOLAX) packet TAKE 17 G BY MOUTH DAILY. AS DIRECTED 90 packet 1  . tamsulosin (FLOMAX) 0.4 MG CAPS capsule TAKE 1 CAPSULE BY MOUTH DAILY AFTER SUPPER. 90 capsule 1  . triamcinolone cream (KENALOG) 0.1 % APPLY TO AFFECTED AREA TWICE A DAY 30 g 0  . umeclidinium-vilanterol (ANORO ELLIPTA) 62.5-25 MCG/INH AEPB Inhale 1 puff into the lungs daily. 180 each 1  . VENTOLIN HFA 108 (90 Base) MCG/ACT inhaler INHALE 2 PUFFS INTO THE LUNGS EVERY 6 HOURS AS NEEDED FOR WHEEZING. 54 Inhaler 1  . sertraline (ZOLOFT) 25 MG tablet Take 1 tablet (25 mg total) by mouth at bedtime. 90 tablet 1  . sertraline (ZOLOFT) 25 MG tablet TAKE 1 TABLET BY MOUTH AT BEDTIME (Patient not taking: Reported on 10/08/2016) 30 tablet 3   No facility-administered medications prior to visit.     ROS See HPI  Objective:  BP 118/60   Pulse 70   Temp 98 F (36.7 C)   Ht 6' (1.829 m)   Wt 189 lb (85.7 kg)   SpO2 98%   BMI 25.63 kg/m   BP Readings from Last 3 Encounters:  10/08/16 118/60  08/05/16 (!) 118/54  06/02/16 (!) 138/58    Wt Readings from Last 3 Encounters:  10/08/16 189 lb (85.7 kg)  08/05/16 194 lb (88 kg)  06/02/16 193 lb (87.5 kg)    Physical Exam  Constitutional: He is oriented to person, place, and time. No distress.  HENT:  Right Ear: Tympanic membrane, external ear and ear canal normal.  Left Ear: Tympanic membrane and ear canal normal.  Nose: Mucosal edema and  rhinorrhea present. Right sinus exhibits maxillary sinus tenderness. Right sinus exhibits no frontal sinus tenderness. Left sinus exhibits maxillary sinus tenderness. Left sinus exhibits no frontal sinus tenderness.  Mouth/Throat: Uvula is midline. Posterior oropharyngeal erythema present. No oropharyngeal exudate.  Eyes: Conjunctivae and EOM are normal. Right eye exhibits no discharge. Left eye exhibits no discharge. No scleral icterus.  Neck: Normal range of motion. Neck supple. No JVD present. No thyromegaly present.  Cardiovascular: Normal rate and regular rhythm.   Pulmonary/Chest: Effort normal and breath sounds normal. No stridor.  Musculoskeletal: He exhibits no edema.  Lymphadenopathy:    He has no cervical adenopathy.  Neurological: He is alert and oriented to person, place, and time. No cranial nerve deficit.  Skin: Skin is warm and dry.  Vitals reviewed.   Lab Results  Component Value Date   WBC 6.5 01/16/2015   HGB 10.9 (L) 02/28/2016   HCT 32.0 (L) 02/28/2016   PLT 182.0 01/16/2015   GLUCOSE 122 (H) 02/28/2016   CHOL 115 12/13/2015   TRIG 117.0 12/13/2015   HDL 33.00 (L) 12/13/2015   LDLCALC 59 12/13/2015   ALT 16 12/13/2015   AST 18 12/13/2015   NA 141 02/28/2016   K 4.3 02/28/2016   CL 109 02/28/2016   CREATININE 1.60 (H) 02/28/2016   BUN 29 (H) 02/28/2016   CO2 25 12/13/2015   TSH 3.48 12/22/2014   INR 1.17 02/07/2014   HGBA1C 5.7 04/11/2013    No results found.  Assessment & Plan:   [name] was seen today for headache.  Diagnoses and all orders for this visit:  Acute non intractable tension-type headache -     cyclobenzaprine (FLEXERIL) 5 MG tablet; Take 1 tablet (5 mg total) by mouth at bedtime.  Nasal sinus congestion -     methylPREDNISolone acetate (DEPO-MEDROL) injection 40 mg; Inject 1 mL (40 mg total) into the muscle once.   I am having [name] start on cyclobenzaprine. I am also having him maintain his guaiFENesin, buPROPion, aspirin  EC, KLOR-CON M20, atorvastatin, losartan, hydrALAZINE, umeclidinium-vilanterol, tamsulosin, finasteride, VENTOLIN HFA, polyethylene glycol, triamcinolone cream, nystatin ointment, fluticasone, sertraline, furosemide, and Oxycodone HCl. We will continue to administer methylPREDNISolone acetate.  Meds ordered this encounter  Medications  . methylPREDNISolone acetate (DEPO-MEDROL) injection 40 mg  . cyclobenzaprine (FLEXERIL) 5 MG tablet    Sig: Take 1 tablet (5 mg total) by mouth at bedtime.    Dispense:  7 tablet    Refill:  0    Order Specific Question:   Supervising Provider    Answer:   Binnie Rail [[id number]]   Follow-up: No Follow-up on file.  Wilfred Lacy, NP

## 2016-10-09 ENCOUNTER — Telehealth: Payer: Self-pay

## 2016-10-09 ENCOUNTER — Other Ambulatory Visit: Payer: Self-pay | Admitting: Cardiology

## 2016-10-09 NOTE — Telephone Encounter (Signed)
Rx(s) sent to pharmacy electronically.  

## 2016-10-09 NOTE — Telephone Encounter (Signed)
Pt called and stated that the pharmacy "put cyclobenziprin back".   Called pharmacy and they stated that the medication will not be covered by insurance.   Called pt and informed of same. Informed pt that the rx would only be $11.00.   Pt stated understanding and will pick up rx today.

## 2016-10-15 ENCOUNTER — Telehealth: Payer: Self-pay | Admitting: Internal Medicine

## 2016-10-15 NOTE — Telephone Encounter (Signed)
Please advise 

## 2016-10-15 NOTE — Telephone Encounter (Signed)
This can wait for Dr. Nathanial Millman return, patient with placard that he can use.

## 2016-10-15 NOTE — Telephone Encounter (Signed)
This pt recently had a handicap placard form signed by Dr Sharlet Salina. He is wanting to change it to the plate instead. He was wanting to know if it could be signed by someone here in the office if he brings it by today.

## 2016-10-20 ENCOUNTER — Other Ambulatory Visit: Payer: Self-pay | Admitting: Internal Medicine

## 2016-10-21 ENCOUNTER — Other Ambulatory Visit: Payer: Self-pay | Admitting: Internal Medicine

## 2016-10-21 ENCOUNTER — Telehealth: Payer: Self-pay | Admitting: Internal Medicine

## 2016-10-21 NOTE — Telephone Encounter (Signed)
Last dispensed 09/26/2016 with 220 tablets for 37 days

## 2016-10-21 NOTE — Telephone Encounter (Signed)
Pt would like a refill of Oxycodone HCl 20 MG TABS

## 2016-10-23 MED ORDER — OXYCODONE HCL 20 MG PO TABS: 20.0000 mg | ORAL_TABLET | ORAL | 0 refills | Status: DC

## 2016-10-23 NOTE — Telephone Encounter (Signed)
Pt informed

## 2016-10-23 NOTE — Telephone Encounter (Signed)
[place] Controlled Substance Database reviewed with no irregularities. Medication was filled 2 days early per fill date. Dated appropriately and printed for pick up.

## 2016-11-07 ENCOUNTER — Other Ambulatory Visit: Payer: Self-pay | Admitting: Internal Medicine

## 2016-11-10 ENCOUNTER — Telehealth: Payer: Self-pay | Admitting: Internal Medicine

## 2016-11-10 NOTE — Telephone Encounter (Signed)
Please advise 

## 2016-11-10 NOTE — Telephone Encounter (Signed)
Can use Sucrets or Chloraseptic for sore throat and would recommend Xyzal or Zyrtec for the runny nose.

## 2016-11-10 NOTE — Telephone Encounter (Signed)
Pt called stating that he has a sore throat and a runny nose. He wanted to know what he could use over the counter that would help. He does not have transportation to be able to come in to be seen.

## 2016-11-11 ENCOUNTER — Ambulatory Visit (INDEPENDENT_AMBULATORY_CARE_PROVIDER_SITE_OTHER): Payer: Medicare Other | Admitting: Nurse Practitioner

## 2016-11-11 ENCOUNTER — Encounter: Payer: Self-pay | Admitting: Nurse Practitioner

## 2016-11-11 VITALS — BP 110/54 | HR 71 | Temp 97.9°F | Ht 72.0 in | Wt 189.0 lb

## 2016-11-11 DIAGNOSIS — J069 Acute upper respiratory infection, unspecified: Secondary | ICD-10-CM

## 2016-11-11 MED ORDER — BENZOCAINE-MENTHOL 6-10 MG MT LOZG: 1.0000 | LOZENGE | OROMUCOSAL | 0 refills | Status: DC | PRN

## 2016-11-11 MED ORDER — CETIRIZINE HCL 10 MG PO TABS: 10.0000 mg | ORAL_TABLET | Freq: Every day | ORAL | 0 refills | Status: DC

## 2016-11-11 NOTE — Progress Notes (Signed)
Subjective:  Patient ID: [name], male    DOB: 1941-02-03  Age: 76 y.o. MRN: [id number]  CC: Sore Throat (sore throat 3 days. )   Sore Throat   This is a new problem. The current episode started in the past 7 days. The problem has been gradually improving. There has been no fever. Associated symptoms include congestion and a hoarse voice. Pertinent negatives include no coughing, drooling, ear discharge, ear pain, headaches, plugged ear sensation, shortness of breath, stridor, swollen glands or trouble swallowing. He has had no exposure to strep or mono. He has tried nothing for the symptoms.    Outpatient Medications Prior to Visit  Medication Sig Dispense Refill  . aspirin EC 81 MG tablet Take 1 tablet (81 mg total) by mouth daily.    [name] atorvastatin (LIPITOR) 40 MG tablet TAKE 1 TABLET (40 MG TOTAL) BY MOUTH DAILY AT 6 PM. 90 tablet 1  . buPROPion (WELLBUTRIN XL) 150 MG 24 hr tablet Take 150 mg by mouth daily. Reported on 06/12/2015  3  . cyclobenzaprine (FLEXERIL) 5 MG tablet Take 1 tablet (5 mg total) by mouth at bedtime. 7 tablet 0  . finasteride (PROSCAR) 5 MG tablet TAKE 1 TABLET (5 MG TOTAL) BY MOUTH EVERY EVENING. 90 tablet 1  . fluticasone (FLONASE) 50 MCG/ACT nasal spray PLACE 1 SPRAY INTO BOTH NOSTRILS EVERY MORNING. 16 g 3  . furosemide (LASIX) 40 MG tablet TAKE 1 AND 1/2 TABLETS BY MOUTH ONCE DAILY 135 tablet 0  . guaiFENesin (MUCINEX) 600 MG 12 hr tablet Take 1,200 mg by mouth 2 (two) times daily.      . hydrALAZINE (APRESOLINE) 10 MG tablet Take 1 tablet (10 mg total) by mouth 3 (three) times daily. 270 tablet 3  . KLOR-CON M20 20 MEQ tablet TAKE 2 TABLETS (40 MEQ TOTAL) BY MOUTH DAILY. EVERY EVENING 180 tablet 0  . losartan (COZAAR) 100 MG tablet TAKE 1 TABLET (100 MG TOTAL) BY MOUTH DAILY. EVERY EVENING 90 tablet 3  . nystatin ointment (MYCOSTATIN) APPLY TO AFFECTED AREA TWICE A DAY 30 g 0  . Oxycodone HCl 20 MG TABS Take 1-2 tablets (20-40 mg total) by mouth every  4 (four) hours. 220 tablet 0  . polyethylene glycol (MIRALAX / GLYCOLAX) packet TAKE 17 G BY MOUTH DAILY. AS DIRECTED 90 packet 1  . sertraline (ZOLOFT) 25 MG tablet TAKE 1 TABLET BY MOUTH AT BEDTIME 30 tablet 3  . tamsulosin (FLOMAX) 0.4 MG CAPS capsule TAKE 1 CAPSULE BY MOUTH DAILY AFTER SUPPER. 90 capsule 1  . triamcinolone cream (KENALOG) 0.1 % APPLY TO AFFECTED AREA TWICE A DAY 30 g 0  . umeclidinium-vilanterol (ANORO ELLIPTA) 62.5-25 MCG/INH AEPB Inhale 1 puff into the lungs daily. 180 each 1  . VENTOLIN HFA 108 (90 Base) MCG/ACT inhaler INHALE 2 PUFFS INTO THE LUNGS EVERY 6 HOURS AS NEEDED FOR WHEEZING. 54 Inhaler 1   No facility-administered medications prior to visit.     ROS See HPI  Objective:  BP (!) 110/54   Pulse 71   Temp 97.9 F (36.6 C)   Ht 6' (1.829 m)   Wt 189 lb (85.7 kg)   SpO2 100%   BMI 25.63 kg/m   BP Readings from Last 3 Encounters:  11/11/16 (!) 110/54  10/08/16 118/60  08/05/16 (!) 118/54    Wt Readings from Last 3 Encounters:  11/11/16 189 lb (85.7 kg)  10/08/16 189 lb (85.7 kg)  08/05/16 194 lb (88 kg)  Physical Exam  Constitutional: He is oriented to person, place, and time. No distress.  HENT:  Right Ear: Tympanic membrane, external ear and ear canal normal.  Left Ear: Tympanic membrane and ear canal normal.  Nose: Mucosal edema and rhinorrhea present. Right sinus exhibits no maxillary sinus tenderness and no frontal sinus tenderness. Left sinus exhibits no maxillary sinus tenderness and no frontal sinus tenderness.  Mouth/Throat: Uvula is midline. He has dentures. Posterior oropharyngeal erythema present. No oropharyngeal exudate.  Neck: Normal range of motion. Neck supple.  Cardiovascular: Normal rate.   Pulmonary/Chest: Effort normal.  Lymphadenopathy:    He has no cervical adenopathy.  Neurological: He is alert and oriented to person, place, and time.  Vitals reviewed.   Lab Results  Component Value Date   WBC 6.5 01/16/2015    HGB 10.9 (L) 02/28/2016   HCT 32.0 (L) 02/28/2016   PLT 182.0 01/16/2015   GLUCOSE 122 (H) 02/28/2016   CHOL 115 12/13/2015   TRIG 117.0 12/13/2015   HDL 33.00 (L) 12/13/2015   LDLCALC 59 12/13/2015   ALT 16 12/13/2015   AST 18 12/13/2015   NA 141 02/28/2016   K 4.3 02/28/2016   CL 109 02/28/2016   CREATININE 1.60 (H) 02/28/2016   BUN 29 (H) 02/28/2016   CO2 25 12/13/2015   TSH 3.48 12/22/2014   INR 1.17 02/07/2014   HGBA1C 5.7 04/11/2013    No results found.  Assessment & Plan:   [name] was seen today for sore throat.  Diagnoses and all orders for this visit:  Acute URI -     cetirizine (ZYRTEC) 10 MG tablet; Take 1 tablet (10 mg total) by mouth at bedtime. -     benzocaine-menthol (CHLORASEPTIC SORE THROAT) 6-10 MG lozenge; Take 1 lozenge by mouth as needed for sore throat.   I am having [name] start on cetirizine and benzocaine-menthol. I am also having him maintain his guaiFENesin, buPROPion, aspirin EC, losartan, hydrALAZINE, umeclidinium-vilanterol, VENTOLIN HFA, polyethylene glycol, fluticasone, sertraline, furosemide, cyclobenzaprine, KLOR-CON M20, tamsulosin, finasteride, nystatin ointment, triamcinolone cream, Oxycodone HCl, and atorvastatin.  Meds ordered this encounter  Medications  . cetirizine (ZYRTEC) 10 MG tablet    Sig: Take 1 tablet (10 mg total) by mouth at bedtime.    Dispense:  7 tablet    Refill:  0    Order Specific Question:   Supervising Provider    Answer:   Cassandria Anger [[id number]]  . benzocaine-menthol (CHLORASEPTIC SORE THROAT) 6-10 MG lozenge    Sig: Take 1 lozenge by mouth as needed for sore throat.    Dispense:  100 tablet    Refill:  0    Order Specific Question:   Supervising Provider    Answer:   Cassandria Anger [[id number]]    Follow-up: Return if symptoms worsen or fail to improve.  Wilfred Lacy, NP

## 2016-11-11 NOTE — Patient Instructions (Signed)
URI Instructions: Encourage adequate oral hydration.  Use over-the-counter  "cold" medicines  such as "Tylenol cold" , "Advil cold",  "Mucinex" or" Mucinex D"  for cough and congestion.  Avoid decongestants if you have high blood pressure. Use" Delsym" or" Robitussin" cough syrup varietis for cough.  You can use plain "Tylenol" or "Advi"l for fever, chills and achyness.   "Common cold" symptoms are usually triggered by a virus.  The antibiotics are usually not necessary. On average, a" viral cold" illness would take 4-7 days to resolve. Please, make an appointment if you are not better or if you're worse.  

## 2016-11-12 NOTE — Telephone Encounter (Signed)
Tried calling patient back, no answer and vm box was full

## 2016-11-21 ENCOUNTER — Telehealth: Payer: Self-pay | Admitting: Internal Medicine

## 2016-11-21 ENCOUNTER — Encounter: Payer: Self-pay | Admitting: Family

## 2016-11-21 DIAGNOSIS — Z79891 Long term (current) use of opiate analgesic: Secondary | ICD-10-CM | POA: Diagnosis not present

## 2016-11-21 MED ORDER — OXYCODONE HCL 20 MG PO TABS: 20.0000 mg | ORAL_TABLET | ORAL | 0 refills | Status: DC

## 2016-11-21 NOTE — Telephone Encounter (Signed)
UDS up to date and [place] reviewed. Medication refilled.

## 2016-11-21 NOTE — Telephone Encounter (Signed)
Tried calling pt mail box is full, but rx is ready for pick-up if pt call bck. Will retry later,,,/lmb

## 2016-11-21 NOTE — Telephone Encounter (Signed)
Check [place] registry last filled 10/24/2016 @ [facility].../LMB les

## 2016-11-21 NOTE — Telephone Encounter (Addendum)
Tried calling pt again still no answer and can't leave msg due to mailbox being full. Rx is up front waiting for pick-up....[name]

## 2016-11-21 NOTE — Telephone Encounter (Signed)
Oxycodone HCl 20 MG TABS   Patient is requesting a refill on this medication.  He is aware it is due on the 5th.  Please advise. Thank you.

## 2016-11-25 ENCOUNTER — Other Ambulatory Visit: Payer: Self-pay | Admitting: Cardiology

## 2016-11-28 ENCOUNTER — Ambulatory Visit (HOSPITAL_COMMUNITY)
Admission: RE | Admit: 2016-11-28 | Discharge: 2016-11-28 | Disposition: A | Discharge status: Home / Self Care | Payer: Medicare Other | Source: Ambulatory Visit | Attending: Cardiovascular Disease | Admitting: Cardiovascular Disease

## 2016-11-28 DIAGNOSIS — I6529 Occlusion and stenosis of unspecified carotid artery: Secondary | ICD-10-CM

## 2016-11-28 DIAGNOSIS — Z951 Presence of aortocoronary bypass graft: Secondary | ICD-10-CM | POA: Insufficient documentation

## 2016-11-28 DIAGNOSIS — I6523 Occlusion and stenosis of bilateral carotid arteries: Secondary | ICD-10-CM | POA: Insufficient documentation

## 2016-11-28 DIAGNOSIS — I700 Atherosclerosis of aorta: Secondary | ICD-10-CM | POA: Diagnosis not present

## 2016-11-28 DIAGNOSIS — Z87898 Personal history of other specified conditions: Secondary | ICD-10-CM | POA: Diagnosis not present

## 2016-11-28 DIAGNOSIS — I714 Abdominal aortic aneurysm, without rupture, unspecified: Secondary | ICD-10-CM

## 2016-11-28 DIAGNOSIS — I10 Essential (primary) hypertension: Secondary | ICD-10-CM | POA: Insufficient documentation

## 2016-11-28 DIAGNOSIS — J449 Chronic obstructive pulmonary disease, unspecified: Secondary | ICD-10-CM | POA: Diagnosis not present

## 2016-11-28 DIAGNOSIS — I2510 Atherosclerotic heart disease of native coronary artery without angina pectoris: Secondary | ICD-10-CM | POA: Insufficient documentation

## 2016-11-28 DIAGNOSIS — E785 Hyperlipidemia, unspecified: Secondary | ICD-10-CM | POA: Diagnosis not present

## 2016-11-28 DIAGNOSIS — I708 Atherosclerosis of other arteries: Secondary | ICD-10-CM | POA: Diagnosis not present

## 2016-11-28 DIAGNOSIS — I739 Peripheral vascular disease, unspecified: Secondary | ICD-10-CM | POA: Diagnosis not present

## 2016-12-02 ENCOUNTER — Encounter: Payer: Self-pay | Admitting: Family

## 2016-12-02 ENCOUNTER — Ambulatory Visit (INDEPENDENT_AMBULATORY_CARE_PROVIDER_SITE_OTHER): Payer: Medicare Other | Admitting: Family

## 2016-12-02 DIAGNOSIS — R51 Headache: Secondary | ICD-10-CM

## 2016-12-02 DIAGNOSIS — I6529 Occlusion and stenosis of unspecified carotid artery: Secondary | ICD-10-CM | POA: Diagnosis not present

## 2016-12-02 DIAGNOSIS — R519 Headache, unspecified: Secondary | ICD-10-CM

## 2016-12-02 MED ORDER — PREDNISONE 10 MG (21) PO TBPK: ORAL_TABLET | ORAL | 0 refills | Status: DC

## 2016-12-02 NOTE — Patient Instructions (Addendum)
Thank you for choosing [facility].  SUMMARY AND INSTRUCTIONS:  Please continue to take your medications as prescribed.   We will try a course of prednisone to decrease the inflammation.   Continue to drink plenty of fluids.  May try Mucinex as needed.  If your symptoms do not improve please let us know.   Medication:  Your prescription(s) have been submitted to your pharmacy or been printed and provided for you. Please take as directed and contact our office if you believe you are having problem(s) with the medication(s) or have any questions.  Follow up:  If your symptoms worsen or fail to improve, please contact our office for further instruction, or in case of emergency go directly to the emergency room at the closest medical facility.

## 2016-12-02 NOTE — Assessment & Plan Note (Addendum)
Symptoms and exam consistent with acute tension-type headache most likely related to congestion and possibly sinus related. Neurological exam is normal. Does have some station tube dysfunction. Start prednisone taper. Continue over-the-counter medications as needed for symptom relief and supportive care. Follow-up if symptoms worsen or do not improve.

## 2016-12-02 NOTE — Progress Notes (Signed)
Subjective:    Patient ID: [name], male    DOB: [birth year], 76 y.o.   MRN: [id number]  Chief Complaint  Patient presents with  . Headache    having headaches in the back of head and neck, dizziness, thinks it could be sinus related    HPI:  [name] is a 76 y.o. 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 and presents today for an acute office visit.  Continues to experience the associated symptoms of headaches located in the back of his head and neck with some dizziness that he believes may be related to sinuses. Was previously evaluated in the office and diagnosed with headache and given a prescription for cyclobenzaprine and was treated for congestion with depo-medrol which did not help very much. No fevers. Dizziness is described as swimmy headed. No changes in hearing or vision. No weakness.   Allergies  Allergen Reactions  . Amlodipine Nausea And Vomiting and Other (See Comments)    dizziness  . Hydrocodone Itching and Rash  . Other Nausea And Vomiting    STATES HE HAS NOT EATEN ANY FISH INCLUDING SHELLFISH FOR PAST 40 YRS - IT CAUSED NAUSEA  . Tylenol [Acetaminophen] Other (See Comments)    Liver problems      Outpatient Medications Prior to Visit  Medication Sig Dispense Refill  . aspirin  EC 81 MG tablet Take 1 tablet (81 mg total) by mouth daily.    [name] atorvastatin (LIPITOR) 40 MG tablet TAKE 1 TABLET (40 MG TOTAL) BY MOUTH DAILY AT 6 PM. 90 tablet 1  . finasteride (PROSCAR) 5 MG tablet TAKE 1 TABLET (5 MG TOTAL) BY MOUTH EVERY EVENING. 90 tablet 1  . fluticasone (FLONASE) 50 MCG/ACT nasal spray PLACE 1 SPRAY INTO BOTH NOSTRILS EVERY MORNING. 16 g 3  . furosemide (LASIX) 40 MG tablet TAKE 1 AND 1/2 TABLET BY MOUTH ONCE DAILY 135 tablet 0  . guaiFENesin (MUCINEX) 600 MG 12 hr tablet Take 1,200 mg by mouth 2 (two) times daily.      . hydrALAZINE (APRESOLINE) 10 MG tablet Take 1 tablet (10 mg total) by mouth 3 (three) times daily. 270 tablet 3  . KLOR-CON M20 20 MEQ tablet TAKE 2 TABLETS (40 MEQ TOTAL) BY MOUTH DAILY. EVERY EVENING 180 tablet 0  . losartan (COZAAR) 100 MG tablet TAKE 1 TABLET (100 MG TOTAL) BY MOUTH DAILY. EVERY EVENING 90 tablet 3  . nystatin ointment (MYCOSTATIN) APPLY TO AFFECTED AREA TWICE A DAY 30 g 0  . Oxycodone HCl 20 MG TABS Take 1-2 tablets (20-40 mg total) by mouth every 4 (four) hours. 220 tablet 0  . polyethylene glycol (MIRALAX / GLYCOLAX) packet TAKE 17 G BY MOUTH DAILY. AS DIRECTED 90 packet 1  . sertraline (ZOLOFT) 25 MG tablet TAKE 1 TABLET BY MOUTH AT BEDTIME 30 tablet 3  . tamsulosin (FLOMAX) 0.4 MG CAPS capsule TAKE 1 CAPSULE BY MOUTH DAILY AFTER SUPPER. 90 capsule 1  . triamcinolone cream (KENALOG) 0.1 %  APPLY TO AFFECTED AREA TWICE A DAY 30 g 0  . umeclidinium-vilanterol (ANORO ELLIPTA) 62.5-25 MCG/INH AEPB Inhale 1 puff into the lungs daily. 180 each 1  . VENTOLIN HFA 108 (90 Base) MCG/ACT inhaler INHALE 2 PUFFS INTO THE LUNGS EVERY 6 HOURS AS NEEDED FOR WHEEZING. 54 Inhaler 1  . benzocaine-menthol (CHLORASEPTIC SORE THROAT) 6-10 MG lozenge Take 1 lozenge by mouth as needed for sore throat. 100 tablet 0  . buPROPion (WELLBUTRIN XL) 150 MG 24 hr tablet Take 150 mg by mouth daily. Reported on 06/12/2015  3  . cetirizine (ZYRTEC) 10 MG tablet Take  1 tablet (10 mg total) by mouth at bedtime. 7 tablet 0  . cyclobenzaprine (FLEXERIL) 5 MG tablet Take 1 tablet (5 mg total) by mouth at bedtime. 7 tablet 0   No facility-administered medications prior to visit.     Past Medical History:  Diagnosis Date  . AAA (abdominal aortic aneurysm) ([place])   . AAA (abdominal aortic aneurysm) ([place])   . Blindness of left eye   . 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 CAD (coronary artery disease)    s/p CABG 10/2008  . Carotid stenosis   . Cerebrovascular disease   . CHF (congestive heart failure) ([place])   . Cirrhosis ([place])    on CT a/p 07/2013, no longer drinking  . CONGENITAL UNSPEC REDUCTION DEFORMITY LOWER LIMB   . Constipation due to opioid therapy 2014   began about a year ago  . COPD (chronic obstructive pulmonary disease) ([place])   . Foley catheter in place   . HTN (hypertension)   . Hyperlipidemia   . 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 Neuromuscular disorder ([place])    PT STATES HE HAS BEEN TOLD HE EITHER HAD POLIO OR CEREBRAL PALSY - STATES HIS LEFT LEG IS SHORTER AND AFFECTS HIS BALANCE - HE WEARS ELEVATED SHOE -LEFT  . Pain    BACK, LEGS AND ARMS  . Shortness of breath    EXERTION  . TOBACCO USE, QUIT       Review of Systems  Constitutional: Negative for chills, fatigue and fever.  HENT: Positive for congestion and sinus pressure. Negative for hearing loss and sinus pain.   Respiratory: Negative for cough, chest tightness and shortness of breath.   Neurological: Positive for dizziness. Negative for tremors, syncope, facial asymmetry, weakness and light-headedness.      Objective:    BP 138/60 (BP Location: Left Arm, Patient Position: Sitting, Cuff Size: Normal)   Pulse 65   Temp 98.1 F (36.7 C) (Oral)   Resp 16   Ht 6' (1.829 m)   Wt 186 lb (84.4 kg)   SpO2 96%   BMI 25.23 kg/m  Nursing note and vital signs  reviewed.  Physical Exam  Constitutional: He is oriented to person, place, and time. He appears well-developed and well-nourished. No distress.  HENT:  Right Ear: Hearing, tympanic membrane, external ear and ear canal normal.  Left Ear: Hearing, tympanic membrane, external ear and ear canal normal.  Nose: Right sinus exhibits maxillary sinus tenderness and frontal sinus tenderness. Left sinus exhibits maxillary sinus tenderness and frontal sinus tenderness.  Mouth/Throat: Uvula is midline, oropharynx is clear and moist and mucous membranes are normal.  Cardiovascular: Normal rate, regular rhythm and intact distal pulses.  Exam reveals no gallop and no friction rub.   Murmur heard. Pulmonary/Chest: Effort normal and breath  sounds normal. No respiratory distress. He has no wheezes. He has no rales. He exhibits no tenderness.  Neurological: He is alert and oriented to person, place, and time.  Skin: Skin is warm and dry.  Psychiatric: He has a normal mood and affect. His behavior is normal. Judgment and thought content normal.       Assessment & Plan:   Problem List Items Addressed This Visit      Other   Sinus headache    Symptoms and exam consistent with acute tension-type headache most likely related to congestion and possibly sinus related. Neurological exam is normal. Does have some station tube dysfunction. Start prednisone taper. Continue over-the-counter medications as needed for symptom relief and supportive care. Follow-up if symptoms worsen or do not improve.         I have discontinued Mr. Tapanes buPROPion, cyclobenzaprine, cetirizine, and benzocaine-menthol. I am also having him start on predniSONE. Additionally, I am having him maintain his guaiFENesin, aspirin EC, losartan, hydrALAZINE, umeclidinium-vilanterol, VENTOLIN HFA, polyethylene glycol, fluticasone, sertraline, KLOR-CON M20, tamsulosin, finasteride, nystatin ointment, triamcinolone cream, atorvastatin, Oxycodone  HCl, and furosemide.   Meds ordered this encounter  Medications  . predniSONE (STERAPRED UNI-PAK 21 TAB) 10 MG (21) TBPK tablet    Sig: Take 6 tablets x 1 day, 5 tablets x 1 day, 4 tablets x 1 day, 3 tablets x 1 day, 2 tablets x 1 day, 1 tablet x 1 day    Dispense:  21 tablet    Refill:  0    Order Specific Question:   Supervising Provider    Answer:   [name] A [[id number]]     Follow-up: Return if symptoms worsen or fail to improve.  Mauricio Po, FNP

## 2016-12-03 ENCOUNTER — Telehealth: Payer: Self-pay | Admitting: Family

## 2016-12-03 NOTE — Telephone Encounter (Signed)
Please inform patient that his urine results were normal and everything is as it should be.

## 2016-12-03 NOTE — Telephone Encounter (Signed)
Tried calling pt in regards. No answer and VM was full. Will try back later.

## 2016-12-04 NOTE — Telephone Encounter (Signed)
I think pt was a little confused. States that he had a urine done a couple of weeks ago. Did inform him that UDS was normal.

## 2016-12-08 ENCOUNTER — Telehealth: Payer: Self-pay | Admitting: Family

## 2016-12-08 MED ORDER — PREDNISONE 10 MG (21) PO TBPK: ORAL_TABLET | ORAL | 0 refills | Status: DC

## 2016-12-08 NOTE — Telephone Encounter (Signed)
Prednisone taper sent to pharmacy. If symptoms do not go away may need to consider imaging.

## 2016-12-08 NOTE — Telephone Encounter (Signed)
Pt called stating that while he was on the Prednizone he was feeling much better. His headache had gone completely away. He completed the 6th day but now the pain is back and just as severe. He wanted to know what he needed to do. Please advise.

## 2016-12-10 ENCOUNTER — Telehealth: Payer: Self-pay | Admitting: Internal Medicine

## 2016-12-23 ENCOUNTER — Other Ambulatory Visit: Payer: Self-pay | Admitting: Family

## 2016-12-23 ENCOUNTER — Telehealth: Payer: Self-pay | Admitting: Internal Medicine

## 2016-12-23 MED ORDER — OXYCODONE HCL 20 MG PO TABS: 20.0000 mg | ORAL_TABLET | ORAL | 0 refills | Status: DC

## 2016-12-23 NOTE — Telephone Encounter (Signed)
NCCSD reviewed with no irregularities. UDS is up to date. Follow up with PCP as scheduled.

## 2016-12-23 NOTE — Telephone Encounter (Signed)
Check [place] registry last filled 12/25/16.../LMB
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
## 2016-12-23 NOTE — Telephone Encounter (Signed)
Notified pt rx ready for pick-up.../LMB 

## 2016-12-26 ENCOUNTER — Ambulatory Visit (INDEPENDENT_AMBULATORY_CARE_PROVIDER_SITE_OTHER): Payer: Medicare Other | Admitting: Internal Medicine

## 2016-12-26 ENCOUNTER — Encounter: Payer: Self-pay | Admitting: Internal Medicine

## 2016-12-26 VITALS — BP 130/58 | HR 63 | Temp 98.7°F | Ht 72.0 in | Wt 183.0 lb

## 2016-12-26 DIAGNOSIS — E785 Hyperlipidemia, unspecified: Secondary | ICD-10-CM | POA: Diagnosis not present

## 2016-12-26 DIAGNOSIS — G8929 Other chronic pain: Secondary | ICD-10-CM | POA: Diagnosis not present

## 2016-12-26 DIAGNOSIS — I6529 Occlusion and stenosis of unspecified carotid artery: Secondary | ICD-10-CM

## 2016-12-26 DIAGNOSIS — M549 Dorsalgia, unspecified: Secondary | ICD-10-CM | POA: Diagnosis not present

## 2016-12-26 DIAGNOSIS — Z23 Encounter for immunization: Secondary | ICD-10-CM | POA: Diagnosis not present

## 2016-12-26 MED ORDER — FENTANYL 100 MCG/HR TD PT72: 100.0000 ug | MEDICATED_PATCH | TRANSDERMAL | 0 refills | Status: DC

## 2016-12-26 MED ORDER — ROSUVASTATIN CALCIUM 10 MG PO TABS: 10.0000 mg | ORAL_TABLET | Freq: Every day | ORAL | 3 refills | Status: DC

## 2016-12-26 NOTE — Patient Instructions (Signed)
We have given you the prescription for the scooter.   Stop taking the lipitor. Wait 2 weeks then start taking crestor (rosuvastatin) which should not affect the muscles.   We have given you the prescription for the pain patch which should help better. You need to be very careful when taking the oxycodone once you put on the patch. I would recommend to take only 1/2 pill at a time no sooner than 2-3 hours apart. You will change the patch every 3 days.   Call or mychart message [place] in about 1-2 weeks and let us know how it is going. If you are having problems sooner call us sooner.

## 2016-12-28 ENCOUNTER — Other Ambulatory Visit: Payer: Self-pay | Admitting: Internal Medicine

## 2016-12-28 NOTE — Assessment & Plan Note (Signed)
Will switch lipitor with crestor to see if this helps his pain some.

## 2016-12-28 NOTE — Progress Notes (Signed)
   Subjective:    Patient ID: [name], male    DOB: 03-16-1941, 76 y.o.   MRN: [id number]  HPI The patient is a 76 YO man coming in for follow up of his chronic pain. He has been getting less and less relief with his oxycodone lately. He is able to feel relief but for only 1 hour. He wonders if there is another regimen which might work better for him. He does have a lot of back and arthritis issues and is in chronic pain which limits his mobility. He would like to have a scooter to help him with mobility. Some of his pain is also from isolation as he is not able to get around well. He has a cane but tires easily. He denies recent injury or overuse. He also wants to know if we could switch his lipitor in case it is worsening his pain.   Review of Systems  Constitutional: Positive for activity change and fatigue. Negative for appetite change.  Respiratory: Negative for cough, chest tightness and shortness of breath.   Cardiovascular: Negative for chest pain, palpitations and leg swelling.  Gastrointestinal: Negative for abdominal distention, abdominal pain, constipation, diarrhea, nausea and vomiting.  Musculoskeletal: Positive for arthralgias, back pain and gait problem.  Skin: Negative.   Neurological: Negative for dizziness, facial asymmetry, speech difficulty, weakness, light-headedness and headaches.  Psychiatric/Behavioral: Positive for decreased concentration and dysphoric mood.      Objective:   Physical Exam  Constitutional: He is oriented to person, place, and time. He appears well-developed and well-nourished.  HENT:  Head: Normocephalic and atraumatic.  Eyes: EOM are normal.  Neck: Normal range of motion.  Cardiovascular: Normal rate and regular rhythm.   Pulmonary/Chest: Effort normal. No respiratory distress. He has no wheezes. He has no rales.  Abdominal: Soft. Bowel sounds are normal. He exhibits no distension. There is no tenderness. There is no rebound.    Musculoskeletal: He exhibits no edema.  Neurological: He is alert and oriented to person, place, and time. Coordination abnormal.  Cane for ambulation  Skin: Skin is warm and dry.  Psychiatric:  Some flat affect   Vitals:   12/26/16 1519  BP: (!) 130/58  Pulse: 63  Temp: 98.7 F (37.1 C)  TempSrc: Oral  SpO2: 100%  Weight: 183 lb (83 kg)  Height: 6' (1.829 m)      Assessment & Plan:  Flu shot given at visit

## 2016-12-28 NOTE — Assessment & Plan Note (Signed)
Will switch from oxycodone to fentanyl to see if we can manage his pain better. Keep oxycodone for breakthrough but dose reduction to avoid accidental overdose. Can adjust as needed for control in the next several weeks.

## 2016-12-29 NOTE — Telephone Encounter (Signed)
Is it okay to refill Rx. I saw medication under sinus headaches and was not sure if it was appropriate. Please advise. thanks

## 2017-01-01 ENCOUNTER — Other Ambulatory Visit: Payer: Self-pay | Admitting: Cardiology

## 2017-01-05 ENCOUNTER — Ambulatory Visit (INDEPENDENT_AMBULATORY_CARE_PROVIDER_SITE_OTHER): Payer: Medicare Other | Admitting: Internal Medicine

## 2017-01-05 ENCOUNTER — Encounter: Payer: Self-pay | Admitting: Internal Medicine

## 2017-01-05 ENCOUNTER — Telehealth: Payer: Self-pay

## 2017-01-05 DIAGNOSIS — J441 Chronic obstructive pulmonary disease with (acute) exacerbation: Secondary | ICD-10-CM | POA: Diagnosis not present

## 2017-01-05 DIAGNOSIS — I6529 Occlusion and stenosis of unspecified carotid artery: Secondary | ICD-10-CM | POA: Diagnosis not present

## 2017-01-05 MED ORDER — DOXYCYCLINE HYCLATE 100 MG PO TABS: 100.0000 mg | ORAL_TABLET | Freq: Two times a day (BID) | ORAL | 0 refills | Status: DC

## 2017-01-05 MED ORDER — PRAVASTATIN SODIUM 20 MG PO TABS: 20.0000 mg | ORAL_TABLET | Freq: Every day | ORAL | 3 refills | Status: DC

## 2017-01-05 NOTE — Telephone Encounter (Signed)
Already sent in pravastatin

## 2017-01-05 NOTE — Patient Instructions (Signed)
We have sent in doxycycline to help the lungs. Take 1 pill twice a day for 1 week.
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
## 2017-01-05 NOTE — Assessment & Plan Note (Signed)
Exacerbation today and rx for doxycycline. Advised to call back with worsening. If no resolution needs CXR for new rhonchi in the RLL.

## 2017-01-05 NOTE — Telephone Encounter (Signed)
Called patients pharmacy regarding his rosuvastatin and pharmacy stated that his insurance does not cover it and it would be $500 they would like to know an alternative that could be sent in.

## 2017-01-18 ENCOUNTER — Other Ambulatory Visit: Payer: Self-pay | Admitting: Cardiovascular Disease

## 2017-01-20 ENCOUNTER — Other Ambulatory Visit: Payer: Self-pay

## 2017-01-20 ENCOUNTER — Emergency Department (HOSPITAL_COMMUNITY): Payer: Medicare Other

## 2017-01-20 ENCOUNTER — Encounter (HOSPITAL_COMMUNITY): Payer: Self-pay | Admitting: Emergency Medicine

## 2017-01-20 DIAGNOSIS — K7460 Unspecified cirrhosis of liver: Secondary | ICD-10-CM | POA: Diagnosis not present

## 2017-01-20 DIAGNOSIS — I441 Atrioventricular block, second degree: Secondary | ICD-10-CM | POA: Insufficient documentation

## 2017-01-20 DIAGNOSIS — I5030 Unspecified diastolic (congestive) heart failure: Secondary | ICD-10-CM | POA: Diagnosis not present

## 2017-01-20 DIAGNOSIS — Z885 Allergy status to narcotic agent status: Secondary | ICD-10-CM | POA: Diagnosis not present

## 2017-01-20 DIAGNOSIS — M79601 Pain in right arm: Secondary | ICD-10-CM | POA: Diagnosis not present

## 2017-01-20 DIAGNOSIS — I2510 Atherosclerotic heart disease of native coronary artery without angina pectoris: Secondary | ICD-10-CM | POA: Diagnosis not present

## 2017-01-20 DIAGNOSIS — Z91013 Allergy to seafood: Secondary | ICD-10-CM | POA: Insufficient documentation

## 2017-01-20 DIAGNOSIS — E872 Acidosis: Secondary | ICD-10-CM | POA: Diagnosis not present

## 2017-01-20 DIAGNOSIS — G8929 Other chronic pain: Secondary | ICD-10-CM | POA: Diagnosis not present

## 2017-01-20 DIAGNOSIS — Z9889 Other specified postprocedural states: Secondary | ICD-10-CM | POA: Diagnosis not present

## 2017-01-20 DIAGNOSIS — I959 Hypotension, unspecified: Secondary | ICD-10-CM | POA: Insufficient documentation

## 2017-01-20 DIAGNOSIS — I130 Hypertensive heart and chronic kidney disease with heart failure and stage 1 through stage 4 chronic kidney disease, or unspecified chronic kidney disease: Secondary | ICD-10-CM | POA: Insufficient documentation

## 2017-01-20 DIAGNOSIS — R110 Nausea: Secondary | ICD-10-CM | POA: Diagnosis not present

## 2017-01-20 DIAGNOSIS — N189 Chronic kidney disease, unspecified: Secondary | ICD-10-CM | POA: Diagnosis not present

## 2017-01-20 DIAGNOSIS — R9431 Abnormal electrocardiogram [ECG] [EKG]: Secondary | ICD-10-CM | POA: Diagnosis not present

## 2017-01-20 DIAGNOSIS — J449 Chronic obstructive pulmonary disease, unspecified: Secondary | ICD-10-CM | POA: Insufficient documentation

## 2017-01-20 DIAGNOSIS — M549 Dorsalgia, unspecified: Secondary | ICD-10-CM | POA: Insufficient documentation

## 2017-01-20 DIAGNOSIS — Z951 Presence of aortocoronary bypass graft: Secondary | ICD-10-CM | POA: Insufficient documentation

## 2017-01-20 DIAGNOSIS — R079 Chest pain, unspecified: Secondary | ICD-10-CM | POA: Diagnosis not present

## 2017-01-20 DIAGNOSIS — I714 Abdominal aortic aneurysm, without rupture: Secondary | ICD-10-CM | POA: Insufficient documentation

## 2017-01-20 DIAGNOSIS — R05 Cough: Secondary | ICD-10-CM | POA: Insufficient documentation

## 2017-01-20 DIAGNOSIS — E785 Hyperlipidemia, unspecified: Secondary | ICD-10-CM | POA: Diagnosis not present

## 2017-01-20 DIAGNOSIS — N179 Acute kidney failure, unspecified: Secondary | ICD-10-CM | POA: Diagnosis not present

## 2017-01-20 DIAGNOSIS — I350 Nonrheumatic aortic (valve) stenosis: Secondary | ICD-10-CM | POA: Diagnosis not present

## 2017-01-20 DIAGNOSIS — R531 Weakness: Secondary | ICD-10-CM | POA: Insufficient documentation

## 2017-01-20 DIAGNOSIS — N183 Chronic kidney disease, stage 3 (moderate): Secondary | ICD-10-CM | POA: Insufficient documentation

## 2017-01-20 DIAGNOSIS — Z7982 Long term (current) use of aspirin: Secondary | ICD-10-CM | POA: Insufficient documentation

## 2017-01-20 DIAGNOSIS — N400 Enlarged prostate without lower urinary tract symptoms: Secondary | ICD-10-CM | POA: Diagnosis not present

## 2017-01-20 DIAGNOSIS — N19 Unspecified kidney failure: Secondary | ICD-10-CM | POA: Diagnosis not present

## 2017-01-20 DIAGNOSIS — Z79899 Other long term (current) drug therapy: Secondary | ICD-10-CM | POA: Insufficient documentation

## 2017-01-20 DIAGNOSIS — Z886 Allergy status to analgesic agent status: Secondary | ICD-10-CM | POA: Diagnosis not present

## 2017-01-20 DIAGNOSIS — Z87891 Personal history of nicotine dependence: Secondary | ICD-10-CM | POA: Insufficient documentation

## 2017-01-20 DIAGNOSIS — R5381 Other malaise: Secondary | ICD-10-CM | POA: Diagnosis not present

## 2017-01-20 LAB — BASIC METABOLIC PANEL
Anion gap: 11 (ref 5–15)
BUN: 139 mg/dL — ABNORMAL HIGH (ref 6–20)
CO2: 15 mmol/L — ABNORMAL LOW (ref 22–32)
Calcium: 9 mg/dL (ref 8.9–10.3)
Chloride: 108 mmol/L (ref 101–111)
Creatinine, Ser: 2.63 mg/dL — ABNORMAL HIGH (ref 0.61–1.24)
GFR calc Af Amer: 26 mL/min — ABNORMAL LOW (ref >60)
GFR calc non Af Amer: 22 mL/min — ABNORMAL LOW (ref >60)
Glucose, Bld: 140 mg/dL — ABNORMAL HIGH (ref 65–99)
Potassium: 5.4 mmol/L — ABNORMAL HIGH (ref 3.5–5.1)
Sodium: 134 mmol/L — ABNORMAL LOW (ref 135–145)

## 2017-01-20 LAB — CBC
HCT: 33.3 % — ABNORMAL LOW (ref 39.0–52.0)
Hemoglobin: 11 g/dL — ABNORMAL LOW (ref 13.0–17.0)
MCH: 28.3 pg (ref 26.0–34.0)
MCHC: 33 g/dL (ref 30.0–36.0)
MCV: 85.6 fL (ref 78.0–100.0)
Platelets: 214 10*3/uL (ref 150–400)
RBC: 3.89 MIL/uL — ABNORMAL LOW (ref 4.22–5.81)
RDW: 14.1 % (ref 11.5–15.5)
WBC: 11.4 10*3/uL — ABNORMAL HIGH (ref 4.0–10.5)

## 2017-01-20 LAB — I-STAT TROPONIN, ED: Troponin i, poc: 0.06 ng/mL (ref 0.00–0.08)

## 2017-01-20 NOTE — ED Triage Notes (Signed)
Pt poor historian, reports R arm pain present X several weeks. Pt states his PCP is aware and has been experimenting b/w oxy and fentanyl.  CP protocol initiated b/c CC at [facility] was CP even though pt states he is having NO CP

## 2017-01-21 ENCOUNTER — Other Ambulatory Visit: Payer: Self-pay

## 2017-01-21 ENCOUNTER — Observation Stay (HOSPITAL_COMMUNITY)
Admission: EM | Admit: 2017-01-21 | Discharge: 2017-01-22 | Disposition: A | Discharge status: Home / Self Care | Payer: Medicare Other | Attending: Internal Medicine | Admitting: Internal Medicine

## 2017-01-21 ENCOUNTER — Emergency Department (HOSPITAL_COMMUNITY): Payer: Medicare Other

## 2017-01-21 DIAGNOSIS — G8929 Other chronic pain: Secondary | ICD-10-CM | POA: Diagnosis not present

## 2017-01-21 DIAGNOSIS — N189 Chronic kidney disease, unspecified: Secondary | ICD-10-CM

## 2017-01-21 DIAGNOSIS — R5381 Other malaise: Secondary | ICD-10-CM

## 2017-01-21 DIAGNOSIS — N19 Unspecified kidney failure: Secondary | ICD-10-CM | POA: Diagnosis not present

## 2017-01-21 DIAGNOSIS — N183 Chronic kidney disease, stage 3 unspecified: Secondary | ICD-10-CM | POA: Diagnosis present

## 2017-01-21 DIAGNOSIS — I714 Abdominal aortic aneurysm, without rupture: Secondary | ICD-10-CM | POA: Diagnosis not present

## 2017-01-21 DIAGNOSIS — I350 Nonrheumatic aortic (valve) stenosis: Secondary | ICD-10-CM

## 2017-01-21 DIAGNOSIS — K7460 Unspecified cirrhosis of liver: Secondary | ICD-10-CM | POA: Diagnosis present

## 2017-01-21 DIAGNOSIS — M549 Dorsalgia, unspecified: Secondary | ICD-10-CM

## 2017-01-21 DIAGNOSIS — E872 Acidosis, unspecified: Secondary | ICD-10-CM | POA: Diagnosis present

## 2017-01-21 DIAGNOSIS — N179 Acute kidney failure, unspecified: Secondary | ICD-10-CM | POA: Diagnosis not present

## 2017-01-21 DIAGNOSIS — R531 Weakness: Secondary | ICD-10-CM

## 2017-01-21 DIAGNOSIS — I441 Atrioventricular block, second degree: Secondary | ICD-10-CM | POA: Diagnosis present

## 2017-01-21 DIAGNOSIS — I10 Essential (primary) hypertension: Secondary | ICD-10-CM | POA: Diagnosis not present

## 2017-01-21 DIAGNOSIS — R079 Chest pain, unspecified: Secondary | ICD-10-CM

## 2017-01-21 LAB — URINALYSIS, ROUTINE W REFLEX MICROSCOPIC
Bilirubin Urine: NEGATIVE
Glucose, UA: NEGATIVE mg/dL
Hgb urine dipstick: NEGATIVE
Ketones, ur: NEGATIVE mg/dL
Leukocytes, UA: NEGATIVE
Nitrite: NEGATIVE
Protein, ur: NEGATIVE mg/dL
Specific Gravity, Urine: 1.011 (ref 1.005–1.030)
pH: 5 (ref 5.0–8.0)

## 2017-01-21 LAB — BRAIN NATRIURETIC PEPTIDE: B Natriuretic Peptide: 63.4 pg/mL (ref 0.0–100.0)

## 2017-01-21 LAB — I-STAT TROPONIN, ED: Troponin i, poc: 0.05 ng/mL (ref 0.00–0.08)

## 2017-01-21 LAB — BASIC METABOLIC PANEL
Anion gap: 10 (ref 5–15)
BUN: 125 mg/dL — ABNORMAL HIGH (ref 6–20)
CO2: 17 mmol/L — ABNORMAL LOW (ref 22–32)
Calcium: 8.3 mg/dL — ABNORMAL LOW (ref 8.9–10.3)
Chloride: 108 mmol/L (ref 101–111)
Creatinine, Ser: 1.98 mg/dL — ABNORMAL HIGH (ref 0.61–1.24)
GFR calc Af Amer: 37 mL/min — ABNORMAL LOW (ref >60)
GFR calc non Af Amer: 32 mL/min — ABNORMAL LOW (ref >60)
Glucose, Bld: 97 mg/dL (ref 65–99)
Potassium: 4.4 mmol/L (ref 3.5–5.1)
Sodium: 135 mmol/L (ref 135–145)

## 2017-01-21 LAB — CREATININE, URINE, RANDOM: Creatinine, Urine: 100.68 mg/dL

## 2017-01-21 LAB — NA AND K (SODIUM & POTASSIUM), RAND UR
Potassium Urine: 48 mmol/L
Sodium, Ur: <10 mmol/L

## 2017-01-21 LAB — CK: Total CK: 88 U/L (ref 49–397)

## 2017-01-21 MED ORDER — GUAIFENESIN ER 600 MG PO TB12: 1200.0000 mg | ORAL_TABLET | Freq: Two times a day (BID) | ORAL | Status: DC | PRN

## 2017-01-21 MED ORDER — FENTANYL 25 MCG/HR TD PT72
100.0000 ug | MEDICATED_PATCH | TRANSDERMAL | Status: DC
  Administered 2017-01-21: 100 ug via TRANSDERMAL
  Filled 2017-01-21: qty 4

## 2017-01-21 MED ORDER — STERILE WATER FOR INJECTION IV SOLN
INTRAVENOUS | Status: DC
  Administered 2017-01-21 – 2017-01-22 (×3): via INTRAVENOUS
  Filled 2017-01-21 (×6): qty 850

## 2017-01-21 MED ORDER — ONDANSETRON HCL 4 MG/2ML IJ SOLN
4.0000 mg | Freq: Four times a day (QID) | INTRAMUSCULAR | Status: DC | PRN
  Administered 2017-01-21: 4 mg via INTRAVENOUS
  Filled 2017-01-21: qty 2

## 2017-01-21 MED ORDER — SODIUM CHLORIDE 0.9 % IV SOLN
1.0000 g | Freq: Once | INTRAVENOUS | Status: AC
  Administered 2017-01-21: 1 g via INTRAVENOUS
  Filled 2017-01-21: qty 10

## 2017-01-21 MED ORDER — FENTANYL CITRATE (PF) 100 MCG/2ML IJ SOLN
50.0000 ug | Freq: Once | INTRAMUSCULAR | Status: AC
  Administered 2017-01-21: 50 ug via INTRAVENOUS
  Filled 2017-01-21: qty 2

## 2017-01-21 MED ORDER — KETOROLAC TROMETHAMINE 15 MG/ML IJ SOLN
15.0000 mg | Freq: Four times a day (QID) | INTRAMUSCULAR | Status: DC | PRN
  Administered 2017-01-21 – 2017-01-22 (×3): 15 mg via INTRAVENOUS
  Filled 2017-01-21 (×3): qty 1

## 2017-01-21 MED ORDER — ENSURE ENLIVE PO LIQD
237.0000 mL | Freq: Two times a day (BID) | ORAL | Status: DC
  Administered 2017-01-21 – 2017-01-22 (×3): 237 mL via ORAL

## 2017-01-21 MED ORDER — PRAVASTATIN SODIUM 20 MG PO TABS
20.0000 mg | ORAL_TABLET | Freq: Every day | ORAL | Status: DC
  Administered 2017-01-21 – 2017-01-22 (×2): 20 mg via ORAL
  Filled 2017-01-21 (×2): qty 1

## 2017-01-21 MED ORDER — FLUTICASONE PROPIONATE 50 MCG/ACT NA SUSP
1.0000 | Freq: Every day | NASAL | Status: DC
  Administered 2017-01-21 – 2017-01-22 (×2): 1 via NASAL
  Filled 2017-01-21 (×2): qty 16

## 2017-01-21 MED ORDER — ALBUTEROL SULFATE (2.5 MG/3ML) 0.083% IN NEBU: 3.0000 mL | INHALATION_SOLUTION | Freq: Four times a day (QID) | RESPIRATORY_TRACT | Status: DC | PRN

## 2017-01-21 MED ORDER — SODIUM CHLORIDE 0.9 % IV BOLUS (SEPSIS)
1000.0000 mL | Freq: Once | INTRAVENOUS | Status: AC
  Administered 2017-01-21: 1000 mL via INTRAVENOUS

## 2017-01-21 MED ORDER — OXYCODONE HCL 20 MG PO TABS: 20.0000 mg | ORAL_TABLET | ORAL | Status: DC | PRN

## 2017-01-21 MED ORDER — ONDANSETRON HCL 4 MG PO TABS: 4.0000 mg | ORAL_TABLET | Freq: Four times a day (QID) | ORAL | Status: DC | PRN

## 2017-01-21 MED ORDER — TAMSULOSIN HCL 0.4 MG PO CAPS
0.4000 mg | ORAL_CAPSULE | Freq: Every day | ORAL | Status: DC
  Administered 2017-01-21: 0.4 mg via ORAL
  Filled 2017-01-21: qty 1

## 2017-01-21 MED ORDER — DICLOFENAC SODIUM 1 % TD GEL
4.0000 g | Freq: Four times a day (QID) | TRANSDERMAL | Status: DC
  Administered 2017-01-21 – 2017-01-22 (×2): 4 g via TOPICAL
  Filled 2017-01-21 (×2): qty 100

## 2017-01-21 MED ORDER — SERTRALINE HCL 50 MG PO TABS
25.0000 mg | ORAL_TABLET | Freq: Every day | ORAL | Status: DC
  Administered 2017-01-21: 25 mg via ORAL
  Filled 2017-01-21: qty 1

## 2017-01-21 MED ORDER — HEPARIN SODIUM (PORCINE) 5000 UNIT/ML IJ SOLN
5000.0000 [IU] | Freq: Three times a day (TID) | INTRAMUSCULAR | Status: DC
  Administered 2017-01-21 – 2017-01-22 (×3): 5000 [IU] via SUBCUTANEOUS
  Filled 2017-01-21 (×2): qty 1

## 2017-01-21 MED ORDER — FINASTERIDE 5 MG PO TABS
5.0000 mg | ORAL_TABLET | Freq: Every evening | ORAL | Status: DC
  Administered 2017-01-21: 5 mg via ORAL
  Filled 2017-01-21: qty 1

## 2017-01-21 MED ORDER — FAMOTIDINE 20 MG PO TABS
20.0000 mg | ORAL_TABLET | Freq: Two times a day (BID) | ORAL | Status: DC | PRN
  Administered 2017-01-21 – 2017-01-22 (×2): 20 mg via ORAL
  Filled 2017-01-21 (×2): qty 1

## 2017-01-21 MED ORDER — FENTANYL CITRATE (PF) 100 MCG/2ML IJ SOLN
25.0000 ug | INTRAMUSCULAR | Status: DC | PRN
  Administered 2017-01-21 (×4): 50 ug via INTRAVENOUS
  Filled 2017-01-21 (×4): qty 2

## 2017-01-21 MED ORDER — POLYETHYLENE GLYCOL 3350 17 G PO PACK: 17.0000 g | PACK | Freq: Every day | ORAL | Status: DC | PRN

## 2017-01-21 NOTE — ED Notes (Signed)
Stood pt at bedside, O2 went down to 91. Pt did not feel comfortable walking in room so he got back in bed, O2 went back up to 100.

## 2017-01-21 NOTE — Progress Notes (Signed)
Patient is complaining of heartburn. MD notified. Orders placed. Will continue to monitor.

## 2017-01-21 NOTE — ED Notes (Signed)
This RN attempted to draw bmp prior to sending pt to 2w unable to at this time will con sult iV team and sent pt to 2W.

## 2017-01-21 NOTE — Progress Notes (Signed)
[facility]  76 y.o. male with medical history significant of AAA, coronary coronary artery disease status post CABG in 2010, COPD, congestive heart failure and aortic stenosis, liver cirrhosis and chronic right arm pain who is coming in with chief complaint of right arm pain and generalized weakness.  Admits to nausea starting after he was given Doxycycline for a respiratory infection. He has taken 6 days of is and his cough has improved significantly. He does not want to take any more due to the nausea.   Right arm pain comes and goes. Currently not having it. Arm not tender on exam. He asked his doctor to get him off of the Oxycodone that he has been on chronically and he was switched to a Fentanyl patch about 1 month ago. Pain still comes and goes.   Principal Problem:   Acute renal failure superimposed on stage 3 chronic kidney disease  Metabolic acidosis - pre-renal due to diuretics and nausea with poor oral intake - holding Lasix, Ibuprofen, Losartan- last ECHO in 2017 showed normal EF and gr 1 dCHF and there may not be a need for Lasix - renal ultrasound > mild cortical atrophy, bilaterally  - Cr and Bicarb improving  Active Problems:  Hypotension with h/o Essential hypertension - holding all home meds at this point   Nausea - hold Doxy- cough much better per patient- CXR unrevealing    Chronic back pain - on Fentanyl patch    Mobitz type 1 second degree atrioventricular block   BPH - cont Flomax  AAA - 3.1 cm on ultrasound today  Debbe Odea, MD

## 2017-01-21 NOTE — H&P (Addendum)
History and Physical    [name] WFU:[id number] DOB: 08-Apr-1941 DOA: 01/21/2017  PCP: [name], MD  Patient coming from: Home  I have personally briefly reviewed patient's old medical records in [place]  Chief Complaint: Arm pain, generalized weakness  HPI: [name] is a 76 y.o. male with medical history significant of AAA, coronary coronary artery disease status post CABG in 2010, COPD, congestive heart failure and aortic stenosis, liver cirrhosis and chronic right arm pain who is coming in with chief complaint of right arm pain and generalized weakness.  Arm pain has been being treated with fentanyl patch and PRN oxy IR.  Patient reports that he has been having generalized weakness for the last several days along with dizziness and according to daughter-in-law increased confusion.  Patient also reports that he has been having some exertional dyspnea. He has no chest pain, and denies any new wheezing. Finally, patient is complaining of 30 pound weight loss over the past 6 weeks. He denies any night sweats or hx of cancer.  Patient does report nausea for past 6 weeks, vomiting for past couple of days.  ED Course: BUN 139, Creat 2.63, K 5.4.  Previously BUN/creat were 29 and 1.6 11 months ago.  Hospitalist asked to admit for uremia and AKF.   Review of Systems: As per HPI otherwise 10 point review of systems negative.   Past Medical History:  Diagnosis Date  . AAA (abdominal aortic aneurysm) ([place])   . AAA (abdominal aortic aneurysm) ([place])   . Blindness of left eye   . 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 CAD (coronary artery disease)    s/p CABG 10/2008  . Carotid stenosis   . Cerebrovascular disease   . CHF (congestive heart failure) ([place])   . Cirrhosis ([place])    on CT a/p 07/2013, no longer drinking  . CONGENITAL UNSPEC REDUCTION DEFORMITY LOWER LIMB   . Constipation due to opioid therapy 2014   began  about a year ago  . COPD (chronic obstructive pulmonary disease) ([place])   . Foley catheter in place   . HTN (hypertension)   . Hyperlipidemia   . 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 Neuromuscular disorder ([place])    PT STATES HE HAS BEEN TOLD HE EITHER HAD POLIO OR CEREBRAL PALSY - STATES HIS LEFT LEG IS SHORTER AND AFFECTS HIS BALANCE - HE WEARS ELEVATED SHOE -LEFT  . Pain    BACK, LEGS AND ARMS  . Shortness of breath    EXERTION  . TOBACCO USE, QUIT     Past Surgical History:  Procedure Laterality Date  . CORONARY ARTERY BYPASS GRAFT  11/03/2008   Ricard Dillon - x4: left internal mammary artery to the distal left anterior descending, saphemous vein graft to the first circumflex marginal branch with a Y graft sequentiallly to a left posterolateral branch, saphenous vein graft to the distal right coronary artery  . ELECTROPHYSIOLOGIC STUDY N/A 01/18/2015   Procedure: A-Flutter Ablation;  Surgeon: Deboraha Sprang, MD;  Location: [facility];  Service: Cardiovascular;  Laterality: N/A;  . GREEN LIGHT LASER TURP (TRANSURETHRAL RESECTION OF PROSTATE N/A 02/14/2014   Procedure: GREEN LIGHT LASER TURP (TRANSURETHRAL RESECTION OF PROSTATE;  Surgeon: Festus Aloe, MD;  Location: [facility];  Service: Urology;  Laterality: N/A;     reports that he has quit smoking. He has never used  smokeless tobacco. He reports that he does not drink alcohol or use drugs.  Allergies  Allergen Reactions  . Amlodipine Nausea And Vomiting and Other (See Comments)    dizziness  . Hydrocodone Itching and Rash  . Other Nausea And Vomiting    STATES HE HAS NOT EATEN ANY FISH INCLUDING SHELLFISH FOR PAST 40 YRS - IT CAUSED NAUSEA  . Tylenol [Acetaminophen] Other (See Comments)    Liver problems    Family History  Problem Relation Age of Onset  . Cirrhosis Mother        died at 75  . Colon cancer Neg Hx   . Stomach cancer Neg Hx        Prior to Admission medications   Medication Sig Start Date End Date Taking? Authorizing Provider  aspirin EC 81 MG tablet Take 1 tablet (81 mg total) by mouth daily. 02/22/15  Yes Deboraha Sprang, MD  fentaNYL (DURAGESIC - DOSED MCG/HR) 100 MCG/HR Place 1 patch (100 mcg total) onto the skin every 3 (three) days. 12/26/16  Yes [name], MD  finasteride (PROSCAR) 5 MG tablet TAKE 1 TABLET (5 MG TOTAL) BY MOUTH EVERY EVENING. 10/20/16  Yes Golden Circle, FNP  fluticasone (FLONASE) 50 MCG/ACT nasal spray PLACE 1 SPRAY INTO BOTH NOSTRILS EVERY MORNING. Patient taking differently: PLACE 1 SPRAY INTO BOTH NOSTRILS EVERY MORNING AS NEEDED FOR ALLERGIES 06/03/16  Yes [name], MD  furosemide (LASIX) 40 MG tablet TAKE 1 AND 1/2 TABLET BY MOUTH ONCE DAILY 11/25/16  Yes Lelon Perla, MD  guaiFENesin (MUCINEX) 600 MG 12 hr tablet Take 1,200 mg by mouth 2 (two) times daily as needed for cough or to loosen phlegm.    Yes [provider]  hydrALAZINE (APRESOLINE) 10 MG tablet Take 1 tablet (10 mg total) by mouth 3 (three) times daily. 01/31/16  Yes Lelon Perla, MD  ibuprofen (ADVIL,MOTRIN) 200 MG tablet Take 200 mg by mouth every 6 (six) hours as needed for moderate pain.   Yes [provider]  KLOR-CON M20 20 MEQ tablet TAKE 2 TABLETS (40 MEQ TOTAL) BY MOUTH DAILY. EVERY EVENING 01/01/17  Yes Crenshaw, Denice Bors, MD  losartan (COZAAR) 100 MG tablet TAKE 1 TABLET (100 MG TOTAL) BY MOUTH DAILY. EVERY EVENING 01/19/17  Yes Leonie Man, MD  nystatin ointment (MYCOSTATIN) APPLY TO AFFECTED AREA TWICE A DAY Patient taking differently: APPLY TO AFFECTED AREA TWICE A DAY AS NEEDED FOR RASH 10/21/16  Yes [name], MD  Oxycodone HCl 20 MG TABS Take 1-2 tablets (20-40 mg total) by mouth every 4 (four) hours. 12/23/16  Yes Golden Circle, FNP  polyethylene glycol (MIRALAX / GLYCOLAX) packet TAKE 17 G BY MOUTH DAILY. AS DIRECTED Patient taking differently:  TAKE 17 G BY MOUTH DAILY AS NEEDED FOR CONSTIPATION 04/28/16  Yes [name], MD  pravastatin (PRAVACHOL) 20 MG tablet Take 1 tablet (20 mg total) by mouth daily. 01/05/17  Yes [name], MD  sertraline (ZOLOFT) 25 MG tablet TAKE 1 TABLET BY MOUTH EVERYDAY AT BEDTIME Patient taking differently: TAKE 1 TABLET BY MOUTH EVERYDAY AS NEEDED FOR DEPRESSION 12/29/16  Yes [name], MD  tamsulosin (FLOMAX) 0.4 MG CAPS capsule TAKE 1 CAPSULE BY MOUTH DAILY AFTER SUPPER. 10/20/16  Yes Golden Circle, FNP  triamcinolone cream (KENALOG) 0.1 % APPLY TO AFFECTED AREA TWICE A DAY Patient taking differently: APPLY TO AFFECTED AREA TWICE A DAY AS NEEDED FOR RASH 10/21/16  Yes  [name], MD  VENTOLIN HFA 108 [phone] Base) MCG/ACT inhaler INHALE 2 PUFFS INTO THE LUNGS EVERY 6 HOURS AS NEEDED FOR WHEEZING. 04/22/16  Yes [name], MD    Physical Exam: Vitals:   01/20/17 2048 01/20/17 2352 01/21/17 0134 01/21/17 0330  BP: (!) 98/48 (!) 99/40 122/80 (!) 114/49  Pulse: 86 77 (!) 45 (!) 58  Resp: 18 16    Temp: 97.7 F (36.5 C)     TempSrc: Oral     SpO2: 99% 97% 98% 97%  Weight: 83.9 kg (185 lb)     Height: 6' (1.829 m)       Constitutional: NAD, calm, comfortable Eyes: PERRL, lids and conjunctivae normal ENMT: Mucous membranes are moist. Posterior pharynx clear of any exudate or lesions.Normal dentition.  Neck: normal, supple, no masses, no thyromegaly Respiratory: clear to auscultation bilaterally, no wheezing, no crackles. Normal respiratory effort. No accessory muscle use.  Cardiovascular: Regular rate and rhythm, no murmurs / rubs / gallops. No extremity edema. 2+ pedal pulses. No carotid bruits.  Abdomen: no tenderness, no masses palpated. No hepatosplenomegaly. Bowel sounds positive.  Musculoskeletal: no clubbing / cyanosis. No joint deformity upper and lower extremities. Good ROM, no contractures. Normal muscle tone.  Skin: no rashes, lesions, ulcers.  No induration Neurologic: CN 2-12 grossly intact. Sensation intact, DTR normal. Strength 5/5 in all 4.  Psychiatric: Normal judgment and insight. Alert and oriented x 3. Normal mood.    Labs on Admission: I have personally reviewed following labs and imaging studies  CBC:  Recent Labs Lab 01/20/17 2108  WBC 11.4*  HGB 11.0*  HCT 33.3*  MCV 85.6  PLT 127   Basic Metabolic Panel:  Recent Labs Lab 01/20/17 2108  NA 134*  K 5.4*  CL 108  CO2 15*  GLUCOSE 140*  BUN 139*  CREATININE 2.63*  CALCIUM 9.0   GFR: Estimated Creatinine Clearance: 27 mL/min (A) (by C-G formula based on SCr of 2.63 mg/dL (H)). Liver Function Tests: No results for input(s): AST, ALT, ALKPHOS, BILITOT, PROT, ALBUMIN in the last 168 hours. No results for input(s): LIPASE, AMYLASE in the last 168 hours. No results for input(s): AMMONIA in the last 168 hours. Coagulation Profile: No results for input(s): INR, PROTIME in the last 168 hours. Cardiac Enzymes:  Recent Labs Lab 01/21/17 0123  CKTOTAL 88   BNP (last 3 results) No results for input(s): PROBNP in the last 8760 hours. HbA1C: No results for input(s): HGBA1C in the last 72 hours. CBG: No results for input(s): GLUCAP in the last 168 hours. Lipid Profile: No results for input(s): CHOL, HDL, LDLCALC, TRIG, CHOLHDL, LDLDIRECT in the last 72 hours. Thyroid Function Tests: No results for input(s): TSH, T4TOTAL, FREET4, T3FREE, THYROIDAB in the last 72 hours. Anemia Panel: No results for input(s): VITAMINB12, FOLATE, FERRITIN, TIBC, IRON, RETICCTPCT in the last 72 hours. Urine analysis:    Component Value Date/Time   COLORURINE RED (A) 07/28/2013 2005   APPEARANCEUR CLOUDY (A) 07/28/2013 2005   LABSPEC 1.024 07/28/2013 2005   PHURINE 5.5 07/28/2013 2005   GLUCOSEU NEGATIVE 07/28/2013 2005   HGBUR LARGE (A) 07/28/2013 2005   BILIRUBINUR MODERATE (A) 07/28/2013 2005   KETONESUR 15 (A) 07/28/2013 2005   PROTEINUR 100 (A) 07/28/2013 2005     UROBILINOGEN 1.0 07/28/2013 2005   NITRITE NEGATIVE 07/28/2013 2005   LEUKOCYTESUR SMALL (A) 07/28/2013 2005    Radiological Exams on Admission: Dg Chest 1 View  Result Date: 01/20/2017 CLINICAL DATA:  Right-sided chest pain for 2 days.  Previous smoker. EXAM: CHEST 1 VIEW COMPARISON:  02/07/2014 FINDINGS: Postoperative changes in the mediastinum. Normal heart size and pulmonary vascularity. No focal airspace disease or consolidation in the lungs. No blunting of costophrenic angles. No pneumothorax. Mediastinal contours appear intact. IMPRESSION: No active disease. Electronically Signed   By: Lucienne Capers M.D.   On: 01/20/2017 22:16   [employer] [facility]  Result Date: 01/21/2017 CLINICAL DATA:  Weakness for 3 weeks. Known abdominal aortic aneurysm. EXAM: ULTRASOUND OF ABDOMINAL AORTA TECHNIQUE: Ultrasound examination of the abdominal aorta was performed to evaluate for abdominal aortic aneurysm. COMPARISON:  CT 11/02/2013 FINDINGS: Abdominal aortic measurements as follows: Proximal:  2.6 cm Mid:  2 cm Distal:  3.1 cm IMPRESSION: A ectatic abdominal aorta with maximal AP diameter 3.1 cm in the distal portion. No significant change since previous CT. Electronically Signed   By: Lucienne Capers M.D.   On: 01/21/2017 03:16    EKG: Independently reviewed.  Assessment/Plan Principal Problem:   Acute renal failure superimposed on stage 3 chronic kidney disease (HCC) Active Problems:   Essential hypertension   Chronic back pain   Cirrhosis (HCC)   Mobitz type 1 second degree atrioventricular block   Metabolic acidosis, NAG, failure of bicarbonate regeneration   Uremia    1. AKF on previously CKD stage 3 - 1. Could be worsening of CKD alternatively, but will treat as acute for now and hope for reversal. 2. IVF: NS 1L bolus then isotonic bicarb at 125 cc/hr for his NAG acidosis 3. Q6H BMPs 4. Hold all nephrotoxic meds: NSAIDs, ARB, hydralazine (which can cause drug induced  lupus) 5. Strict intake and output 6. UA and lytes 7. Renal US 8. Bladder scan 9. Consult nephrology in AM 2. Mobitz type 1 - 1. Seems to still have mobitz type 1 on the initial EKG.  Other EKGs it can be hard to tell because he seems to go into a 2:1 block. 2. EDP did call cardiology fellow who felt that it was still Mobitz type 1. 3. Chronic back pain - 1. Holding home narcotics 2. Use PRN fentanyl for back pain 4. HTN - 1. Continue flomax 2. Holding hydralazine, lasix, and losartan as above.  DVT prophylaxis: Heparin [place] Code Status: Full Family Communication: No family in room Disposition Plan: Home after admit Consults called: None, call nephrology in AM Admission status: Admit to inpatient   [place], [place] [facility] Pager [phone]  If 7AM-7PM, please contact day team taking care of patient [email or web address] Password [email or web address]  01/21/2017, 3:49 AM

## 2017-01-21 NOTE — ED Provider Notes (Addendum)
[facility] DEPT Provider Note   CSN: [id number] Arrival date & time: 01/20/17  2044     History   Chief Complaint Chief Complaint  Patient presents with  . Arm Pain    HPI DEMETRE MONACO is a 76 y.o. male.  HPI Patient is a 76 year old male with history of AAA, coronary coronary artery disease status post CABG in 2010, COPD, congestive heart failure and aortic stenosis, liver cirrhosis and chronic arm pain who is coming in with chief complaint of arm pain and weakness.  Pt reports that his arm pain has been present for several years. He has been taking fentanyl patch and oxycodone for pain control however his pain has gotten worse over the past few days. Patient has been screening for DVT in the past and the results were negative. Patient denies any new trauma or swelling to his right upper extremity. Primary care doctor has postulated that the symptoms could be because of his statins.  Patient also reports that he has been having generalized weakness for the last several days along with dizziness and according to daughter-in-law increased confusion. Patient has AAA history however he denies any new abdominal or back pain.   Patient also reports that he has been having some exertional dyspnea. He has no chest pain, and denies any new wheezing. Finally, patient is complaining of 30 pound weight loss over the past 6 weeks. He denies any night sweats or hx of cancer.  Past Medical History:  Diagnosis Date  . AAA (abdominal aortic aneurysm) ([place])   . AAA (abdominal aortic aneurysm) ([place])   . Blindness of left eye   . 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 CAD (coronary artery disease)    s/p CABG 10/2008  . Carotid stenosis   . Cerebrovascular disease   . CHF (congestive heart failure) ([place])   . Cirrhosis ([place])    on CT a/p 07/2013, no longer drinking  . CONGENITAL UNSPEC REDUCTION DEFORMITY LOWER LIMB   . Constipation due to opioid therapy 2014    began about a year ago  . COPD (chronic obstructive pulmonary disease) ([place])   . Foley catheter in place   . HTN (hypertension)   . Hyperlipidemia   . 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 Neuromuscular disorder ([place])    PT STATES HE HAS BEEN TOLD HE EITHER HAD POLIO OR CEREBRAL PALSY - STATES HIS LEFT LEG IS SHORTER AND AFFECTS HIS BALANCE - HE WEARS ELEVATED SHOE -LEFT  . Pain    BACK, LEGS AND ARMS  . Shortness of breath    EXERTION  . TOBACCO USE, QUIT     Patient Active Problem List   Diagnosis Date Noted  . Sinus headache 12/02/2016  . Atrial flutter ([place]) 11/24/2014  . Aortic stenosis 03/24/2014  . 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 Cirrhosis ([place]) 07/28/2013  . Bladder outlet obstruction 07/28/2013  . Impaired glucose metabolism   . Chronic back pain   . Benign neoplasm of colon 01/08/2011  . Abdominal aortic aneurysm ([place]) 08/12/2010  . TOBACCO USE, QUIT 04/11/2009  . Occlusion and stenosis of carotid artery 01/08/2009  . Hyperlipidemia 11/13/2008  . ANEMIA 11/13/2008  . Essential hypertension 11/13/2008  . Coronary atherosclerosis 11/13/2008  . COPD (chronic obstructive pulmonary disease) ([place]) 11/13/2008    Past Surgical History:  Procedure Laterality Date  . CORONARY ARTERY BYPASS GRAFT  11/03/2008   Ricard Dillon - x4: left internal mammary artery to the distal left anterior descending, saphemous vein graft to the first circumflex marginal branch with a Y graft sequentiallly to a left posterolateral branch, saphenous vein graft to the distal right coronary artery  . ELECTROPHYSIOLOGIC STUDY N/A 01/18/2015   Procedure: A-Flutter Ablation;  Surgeon: Deboraha Sprang, MD;  Location: [facility];  Service: Cardiovascular;  Laterality: N/A;  . GREEN LIGHT LASER TURP (TRANSURETHRAL RESECTION OF PROSTATE N/A 02/14/2014   Procedure: GREEN LIGHT LASER TURP  (TRANSURETHRAL RESECTION OF PROSTATE;  Surgeon: Festus Aloe, MD;  Location: [facility];  Service: Urology;  Laterality: N/A;       Home Medications    Prior to Admission medications   Medication Sig Start Date End Date Taking? Authorizing Provider  aspirin EC 81 MG tablet Take 1 tablet (81 mg total) by mouth daily. 02/22/15  Yes Deboraha Sprang, MD  fentaNYL (DURAGESIC - DOSED MCG/HR) 100 MCG/HR Place 1 patch (100 mcg total) onto the skin every 3 (three) days. 12/26/16  Yes [name], MD  finasteride (PROSCAR) 5 MG tablet TAKE 1 TABLET (5 MG TOTAL) BY MOUTH EVERY EVENING. 10/20/16  Yes Golden Circle, FNP  fluticasone (FLONASE) 50 MCG/ACT nasal spray PLACE 1 SPRAY INTO BOTH NOSTRILS EVERY MORNING. Patient taking differently: PLACE 1 SPRAY INTO BOTH NOSTRILS EVERY MORNING AS NEEDED FOR ALLERGIES 06/03/16  Yes [name], MD  furosemide (LASIX) 40 MG tablet TAKE 1 AND 1/2 TABLET BY MOUTH ONCE DAILY 11/25/16  Yes Lelon Perla, MD  guaiFENesin (MUCINEX) 600 MG 12 hr tablet Take 1,200 mg by mouth 2 (two) times daily as needed for cough or to loosen phlegm.    Yes [provider]  hydrALAZINE (APRESOLINE) 10 MG tablet Take 1 tablet (10 mg total) by mouth 3 (three) times daily. 01/31/16  Yes Lelon Perla, MD  ibuprofen (ADVIL,MOTRIN) 200 MG tablet Take 200 mg by mouth every 6 (six) hours as needed for moderate pain.   Yes [provider]  KLOR-CON M20 20 MEQ tablet TAKE 2 TABLETS (40 MEQ TOTAL) BY MOUTH DAILY. EVERY EVENING 01/01/17  Yes Crenshaw, Denice Bors, MD  losartan (COZAAR) 100 MG tablet TAKE 1 TABLET (100 MG TOTAL) BY MOUTH DAILY. EVERY EVENING 01/19/17  Yes Leonie Man, MD  nystatin ointment (MYCOSTATIN) APPLY TO AFFECTED AREA TWICE A DAY Patient taking differently: APPLY TO AFFECTED AREA TWICE A DAY AS NEEDED FOR RASH 10/21/16  Yes [name], MD  Oxycodone HCl 20 MG TABS Take 1-2 tablets (20-40 mg total) by mouth every 4 (four) hours.  12/23/16  Yes Golden Circle, FNP  polyethylene glycol (MIRALAX / GLYCOLAX) packet TAKE 17 G BY MOUTH DAILY. AS DIRECTED Patient taking differently: TAKE 17 G BY MOUTH DAILY AS NEEDED FOR CONSTIPATION 04/28/16  Yes [name], MD  pravastatin (PRAVACHOL) 20 MG tablet Take 1 tablet (20 mg total) by mouth daily. 01/05/17  Yes [name], MD  sertraline (ZOLOFT) 25 MG tablet TAKE 1 TABLET BY MOUTH EVERYDAY AT BEDTIME Patient taking differently: TAKE 1 TABLET BY MOUTH EVERYDAY AS NEEDED FOR DEPRESSION 12/29/16  Yes [name], MD  tamsulosin (FLOMAX) 0.4 MG CAPS capsule TAKE 1 CAPSULE BY MOUTH DAILY AFTER SUPPER. 10/20/16  Yes Golden Circle, FNP  triamcinolone cream (KENALOG) 0.1 % APPLY TO AFFECTED AREA TWICE A DAY Patient taking differently: APPLY TO AFFECTED AREA TWICE A DAY AS NEEDED FOR RASH 10/21/16  Yes [name], MD  VENTOLIN HFA 108 [phone] Base) MCG/ACT inhaler INHALE 2 PUFFS INTO THE LUNGS EVERY 6 HOURS AS NEEDED FOR WHEEZING. 04/22/16  Yes [name], MD    Family History Family History  Problem Relation Age of Onset  . Cirrhosis Mother        died at 19  . Colon cancer Neg Hx   . Stomach cancer Neg Hx     Social History Social History  Substance Use Topics  . Smoking status: Former Research scientist (life sciences)  . Smokeless tobacco: Never Used  . Alcohol use No     Comment: History of heavy alcohol use per pt. Quit many years ago1/1/ 2005     Allergies   Amlodipine; Hydrocodone; Other; and Tylenol [acetaminophen]   Review of Systems Review of Systems  Constitutional: Positive for activity change.  Respiratory: Positive for shortness of breath.   Musculoskeletal: Positive for myalgias.  Neurological: Positive for weakness.  All other systems reviewed and are negative.   Physical Exam Updated Vital Signs BP 122/80   Pulse (!) 45   Temp 97.7 F (36.5 C) (Oral)   Resp 16   Ht 6' (1.829 m)   Wt 83.9 kg (185 lb)   SpO2 98%   BMI 25.09  kg/m   Physical Exam  Constitutional: He is oriented to person, place, and time. He appears well-developed. Distressed:    HENT:  Head: Atraumatic.  Eyes: EOM are normal.  Neck: Neck supple.  Cardiovascular: Normal rate.   Murmur heard. Pulmonary/Chest: Effort normal. He has no wheezes.  Abdominal: Soft. He exhibits no mass.  Musculoskeletal: He exhibits no edema.  No edema or swelling of the RUE  Neurological: He is alert and oriented to person, place, and time.  Skin: Skin is warm.  Nursing note and vitals reviewed.   ED Treatments / Results  Labs (all labs ordered are listed, but only abnormal results are displayed) Labs Reviewed  BASIC METABOLIC PANEL - Abnormal; Notable for the following:       Result Value   Sodium 134 (*)    Potassium 5.4 (*)    CO2 15 (*)    Glucose, Bld 140 (*)    BUN 139 (*)    Creatinine, Ser 2.63 (*)    GFR calc non Af Amer 22 (*)    GFR calc Af Amer 26 (*)    All other components within normal limits  CBC - Abnormal; Notable for the following:    WBC 11.4 (*)    RBC 3.89 (*)    Hemoglobin 11.0 (*)    HCT 33.3 (*)    All other components within normal limits  CK  BRAIN NATRIURETIC PEPTIDE  I-STAT TROPONIN, ED  I-STAT TROPONIN, ED    EKG  EKG Interpretation  Date/Time:  Tuesday January 20 2017 21:14:12 EDT Ventricular Rate:  62 PR Interval:    QRS Duration: 94 QT Interval:  402 QTC Calculation: 408 R Axis:   14 Text Interpretation:  complete heart block suspected Confirmed by Varney Biles [phone]) on 01/21/2017 2:24:15 AM       Radiology Dg Chest 1 View  Result Date: 01/20/2017 CLINICAL DATA:  Right-sided chest pain for 2 days.  Previous smoker. EXAM: CHEST 1 VIEW COMPARISON:  02/07/2014 FINDINGS: Postoperative changes in the mediastinum. Normal heart size and pulmonary vascularity. No focal airspace disease or consolidation in the lungs. No blunting of costophrenic angles. No pneumothorax. Mediastinal contours appear  intact. IMPRESSION: No active disease.  Electronically Signed   By: Lucienne Capers M.D.   On: 01/20/2017 22:16    Procedures Procedures (including critical care time)  CRITICAL CARE Performed by: Varney Biles   Total critical care time: 48 minutes for severe uremia  Critical care time was exclusive of separately billable procedures and treating other patients.  Critical care was necessary to treat or prevent imminent or life-threatening deterioration.  Critical care was time spent personally by me on the following activities: development of treatment plan with patient and/or surrogate as well as nursing, discussions with consultants, evaluation of patient's response to treatment, examination of patient, obtaining history from patient or surrogate, ordering and performing treatments and interventions, ordering and review of laboratory studies, ordering and review of radiographic studies, pulse oximetry and re-evaluation of patient's condition.   Medications Ordered in ED Medications  calcium gluconate 1 g in sodium chloride 0.9 % 100 mL IVPB (not administered)  fentaNYL (SUBLIMAZE) injection 50 mcg (not administered)     Initial Impression / Assessment and Plan / ED Course  I have reviewed the triage vital signs and the nursing notes.  Pertinent labs & imaging results that were available during my care of the patient were reviewed by me and considered in my medical decision making (see chart for details).    Patient with multiple medical comorbidities as listed in history of present illness comes in with chief complaint of malaise, weakness, right arm pain that is chronic, weight loss, shortness of breath. The right upper extremity does not show any signs of DVT, infection, fracture or dislocation, arterial insufficiency. We'll try to control pain at the best capacity.  Patient is also having nonspecific and weak symptoms. His labs show uremia which could be the cause for  malaise and confusion. Patient has history of AAA which can explain dizziness, weakness. I'm not sure what to make of the significant weight loss but again uremia can cause appetite suppression.  I think patient is going to need admission to the hospital and optimization of his existing conditions. He will likely needrenal consultation as well.  EKG was concerning for complete heart block to me, however after discussing the case with Dr. Eula Fried, the cardiologist it seems like patient actually is in Mobitz 1 heart block  Also patient has history of aortic stenosis and congestive heart failure, therefore the kidney injury could be cardiorenal process.  Final Clinical Impressions(s) / ED Diagnoses   Final diagnoses:  Weakness  Uremia  Malaise  Chronic kidney disease, unspecified CKD stage  Aortic valve stenosis, etiology of cardiac valve disease unspecified    New Prescriptions New Prescriptions   No medications on file     Varney Biles, MD 01/21/17 [place], [place], MD 01/21/17 [phone]

## 2017-01-21 NOTE — ED Notes (Signed)
Report given to [facility] RN

## 2017-01-21 NOTE — ED Notes (Signed)
Patient transported to Ultrasound 

## 2017-01-21 NOTE — Progress Notes (Signed)
Initial Nutrition Assessment  DOCUMENTATION CODES:   Severe malnutrition in context of acute illness/injury  INTERVENTION:   Ensure Enlive po BID, each supplement provides 350 kcal and 20 grams of protein  NUTRITION DIAGNOSIS:   Malnutrition (Severe) related to acute illness (ARF) as evidenced by 9% weight loss in one month, energy intake < or equal to 50% for > or equal to 1 month, moderate depletion of body fat, severe depletion of muscle mass.  GOAL:   Patient will meet greater than or equal to 90% of their needs  MONITOR:   PO intake, Supplement acceptance, Weight trends, Labs  REASON FOR ASSESSMENT:   Malnutrition Screening Tool    ASSESSMENT:   Pt with PMH significant for AAA, CAD s/p CABG in 2010, COPD, CHF, and aortic stenosis. Presents this admission with AFR superimposed on CKD III and generalized weakness.  Spoke with pt at bedside. Reports having decreased appetite for one month related to his doctor "expirementing" with his pain medication. States when they began switching medications his PO intake declined drastically. Pt would typically eat 1-2 frozen meals per day (lives alone, has a hard time preparing meals for one). Does not use supplementation at home related to the cost. Suspect pt has not consumed his energy requirement for a prolonged period. Discussed sodium value in frozen meals. Pt states "he does not eat salt."  RD obtained bed wt of 165 lbs. Weight history shows pt has lost 9% of body wt in one month. This percentage in this time frame is significant.   Nutrition-Focused physical exam completed. Findings are moderate fat depletion, moderate to severe muscle depletion, and mild edema. Bilateral lower extremities show severe depletions. Pt reports his was diagnosed with cerebral palsy when he was younger. Pt able to ambulate at home.   Medications reviewed and include: fentanyl, sodium bicarb 125 ml/hr Labs reviewed: BUN 125 (H) Creatinine 1.98  (H)  Diet Order:  Diet Heart Room service appropriate? Yes; Fluid consistency: Thin  Skin:  Reviewed, no issues  Last BM:  01/20/17  Height:   Ht Readings from Last 1 Encounters:  01/21/17 6' (1.829 m)    Weight:   Wt Readings from Last 1 Encounters:  01/21/17 165 lb 4.8 oz (75 kg)    Ideal Body Weight:  80.9 kg  BMI:  Body mass index is 22.42 kg/m.  Estimated Nutritional Needs:   Kcal:  2200-2400 (29-32 kcal/kg)  Protein:  120-130 grams (1.6-1.7 g/kg)  Fluid:  >2.2 L/day  EDUCATION NEEDS:   Education needs addressed  [place], LDN Clinical Nutrition Pager # [phone]

## 2017-01-21 NOTE — ED Notes (Signed)
I tried to get patient blood in left AC and hand, I didn't have any success.

## 2017-01-21 NOTE — Progress Notes (Signed)
Patient continues to complain of pain unrelieved by fentanyl patches. MD notified. MD gave verbal orders for Toradol IV 15 mg q6h PRN and Voltaren gel 4 g bid. Orders placed. Will continue to monitor.

## 2017-01-21 NOTE — ED Notes (Signed)
Pt requesting pain meds Dr. Kathrynn Humble notified

## 2017-01-21 NOTE — Progress Notes (Signed)
New Admission Note:   Arrival Method: Bed  Mental Orientation: A& O X4 Telemetry: Initiated Assessment: Completed Skin: See flowsheets IV: Clean, Dry, Intact Pain: Denies Safety Measures: Safety Fall Prevention Plan has been given, discussed and signed Admission: Completed Unit Orientation: Patient has been orientated to the room, unit and staff.   Orders have been reviewed and implemented. Will continue to monitor the patient. Call light has been placed within reach and bed alarm has been activated.    Dixie Dials RN, BSN

## 2017-01-22 ENCOUNTER — Telehealth: Payer: Self-pay | Admitting: Internal Medicine

## 2017-01-22 DIAGNOSIS — E872 Acidosis: Secondary | ICD-10-CM | POA: Diagnosis not present

## 2017-01-22 DIAGNOSIS — I441 Atrioventricular block, second degree: Secondary | ICD-10-CM | POA: Diagnosis not present

## 2017-01-22 DIAGNOSIS — I952 Hypotension due to drugs: Secondary | ICD-10-CM

## 2017-01-22 DIAGNOSIS — N183 Chronic kidney disease, stage 3 (moderate): Secondary | ICD-10-CM

## 2017-01-22 DIAGNOSIS — N179 Acute kidney failure, unspecified: Secondary | ICD-10-CM | POA: Diagnosis not present

## 2017-01-22 LAB — CBC
HCT: 25.2 % — ABNORMAL LOW (ref 39.0–52.0)
Hemoglobin: 8.7 g/dL — ABNORMAL LOW (ref 13.0–17.0)
MCH: 29 pg (ref 26.0–34.0)
MCHC: 34.5 g/dL (ref 30.0–36.0)
MCV: 84 fL (ref 78.0–100.0)
Platelets: 140 10*3/uL — ABNORMAL LOW (ref 150–400)
RBC: 3 MIL/uL — ABNORMAL LOW (ref 4.22–5.81)
RDW: 14.1 % (ref 11.5–15.5)
WBC: 5.7 10*3/uL (ref 4.0–10.5)

## 2017-01-22 LAB — BASIC METABOLIC PANEL
Anion gap: 9 (ref 5–15)
BUN: 112 mg/dL — ABNORMAL HIGH (ref 6–20)
CO2: 23 mmol/L (ref 22–32)
Calcium: 7.9 mg/dL — ABNORMAL LOW (ref 8.9–10.3)
Chloride: 104 mmol/L (ref 101–111)
Creatinine, Ser: 1.79 mg/dL — ABNORMAL HIGH (ref 0.61–1.24)
GFR calc Af Amer: 41 mL/min — ABNORMAL LOW (ref >60)
GFR calc non Af Amer: 36 mL/min — ABNORMAL LOW (ref >60)
Glucose, Bld: 98 mg/dL (ref 65–99)
Potassium: 4.4 mmol/L (ref 3.5–5.1)
Sodium: 136 mmol/L (ref 135–145)

## 2017-01-22 MED ORDER — FAMOTIDINE 20 MG PO TABS
20.0000 mg | ORAL_TABLET | Freq: Two times a day (BID) | ORAL | Status: DC | PRN
Start: 2017-01-22 — End: 2017-09-16

## 2017-01-22 NOTE — Consult Note (Signed)
           [facility] CM Primary Care Navigator  01/22/2017  [name] 1940-09-26 [id number]   Went to seepatient at the bedsideto identify possible discharge needs but he was already discharged per staff report.  Patient was discharged home today.  Primary care provider's officeis listed as doing transition of care (TOC).  Patient has a discharge instruction to follow-up with primary care provider on Monday 10/8 for BMET and Blood pressure check (BUN/ Cr 112/ 1.79).   For questions, please contact:  Dannielle Huh, BSN, RN- [facility] Primary Care Navigator  Telephone: [phone] [place]

## 2017-01-22 NOTE — Discharge Instructions (Signed)
Daily weights. Follow up on cough.    Please take all your medications with you for your next visit with your Primary MD. Please request your Primary MD to go over all hospital test results at the follow up. Please ask your Primary MD to get all Hospital records sent to his/her office.  If you experience worsening of your admission symptoms, develop shortness of breath, chest pain, suicidal or homicidal thoughts or a life threatening emergency, you must seek medical attention immediately by calling 911 or calling your MD.  [name] must read the complete instructions/literature along with all the possible adverse reactions/side effects for all the medicines you take including new medications that have been prescribed to you. Take new medicines after you have completely understood and accpet all the possible adverse reactions/side effects.   Do not drive when taking pain medications or sedatives.    Do not take more than prescribed Pain, Sleep and Anxiety Medications  If you have smoked or chewed Tobacco in the last 2 yrs please stop. Stop any regular alcohol and or recreational drug use.  Wear Seat belts while driving.

## 2017-01-22 NOTE — Discharge Summary (Signed)
Physician Discharge Summary  [name]:[id number] DOB: 1940/10/17 DOA: 01/21/2017  PCP: [name], MD  Admit date: 01/21/2017 Discharge date: 01/22/2017  Admitted From: home Disposition:  home   Recommendations for Outpatient Follow-up:  1. Bmet and BP this coming Monday  Discharge Condition:  stable   CODE STATUS:  Full code   Consultations:  none    Discharge Diagnoses:  Principal Problem:   Acute renal failure superimposed on stage 3 chronic kidney disease ([place]) Active Problems:   Essential hypertension   Chronic back pain   Cirrhosis ([place])   Mobitz type 1 second degree atrioventricular block   Metabolic acidosis, NAG, failure of bicarbonate regeneration    Subjective: No nausea. Eating drinking well.   Brief Summary: 76 y.o.malewith medical history significant of AAA, coronary coronary artery disease status post CABG in 2010, COPD, congestive heart failure and aortic stenosis, liver cirrhosis and chronic rightarm pain who is coming in with chief complaint of right arm pain and generalized weakness.  Active Problems:  Acute renal failure superimposed on stage 3 chronic kidney disease  Metabolic acidosis - pre-renal due to diuretics and nausea with poor oral intake - holding Lasix, Ibuprofen, Losartan- last ECHO in 2017 showed normal EF and gr 1 dCHF and there may not be a need for Lasix - renal ultrasound > mild cortical atrophy, bilaterally  - Cr and Bicarb improving - recommending continuing to hold Lasix and Losartan for now  (BP low as well)  Active Problems:  Hypotension with h/o Essential hypertension - holding all home meds at this point  Right arm pain - tender on exam today in shoulder and near elbow-  - Discussed using Voltaren gel for right arm rather than increasing narcotics- he is willing to try this    Nausea - hold Doxy- cough much better per patient- CXR unrevealing   Chronic back pain? - on Fentanyl  patch  Mobitz type 1 second degree atrioventricular block   BPH - cont Flomax  AAA - 3.1 cm on ultrasound today     Discharge Instructions  Discharge Instructions    Diet - low sodium heart healthy    Complete by:  As directed    Increase activity slowly    Complete by:  As directed      Allergies as of 01/22/2017      Reactions   Amlodipine Nausea And Vomiting, Other (See Comments)   dizziness   Hydrocodone Itching, Rash   Other Nausea And Vomiting   STATES HE HAS NOT EATEN ANY FISH INCLUDING SHELLFISH FOR PAST 40 YRS - IT CAUSED NAUSEA   Tylenol [acetaminophen] Other (See Comments)   Liver problems      Medication List    STOP taking these medications   furosemide 40 MG tablet Commonly known as:  LASIX   ibuprofen 200 MG tablet Commonly known as:  ADVIL,MOTRIN   KLOR-CON M20 20 MEQ tablet Generic drug:  potassium chloride SA   losartan 100 MG tablet Commonly known as:  COZAAR     TAKE these medications   aspirin EC 81 MG tablet Take 1 tablet (81 mg total) by mouth daily.   famotidine 20 MG tablet Commonly known as:  PEPCID Take 1 tablet (20 mg total) by mouth 2 (two) times daily as needed for heartburn or indigestion.   fentaNYL 100 MCG/HR Commonly known as:  DURAGESIC - dosed mcg/hr Place 1 patch (100 mcg total) onto the skin every 3 (three) days.   finasteride  5 MG tablet Commonly known as:  PROSCAR TAKE 1 TABLET (5 MG TOTAL) BY MOUTH EVERY EVENING.   fluticasone 50 MCG/ACT nasal spray Commonly known as:  FLONASE PLACE 1 SPRAY INTO BOTH NOSTRILS EVERY MORNING. What changed:  See the new instructions.   guaiFENesin 600 MG 12 hr tablet Commonly known as:  MUCINEX Take 1,200 mg by mouth 2 (two) times daily as needed for cough or to loosen phlegm.   hydrALAZINE 10 MG tablet Commonly known as:  APRESOLINE Take 1 tablet (10 mg total) by mouth 3 (three) times daily.   nystatin ointment Commonly known as:  MYCOSTATIN APPLY TO AFFECTED AREA  TWICE A DAY What changed:  See the new instructions.   polyethylene glycol packet Commonly known as:  MIRALAX / GLYCOLAX TAKE 17 G BY MOUTH DAILY. AS DIRECTED What changed:  See the new instructions.   pravastatin 20 MG tablet Commonly known as:  PRAVACHOL Take 1 tablet (20 mg total) by mouth daily.   sertraline 25 MG tablet Commonly known as:  ZOLOFT TAKE 1 TABLET BY MOUTH EVERYDAY AT BEDTIME What changed:  See the new instructions.   tamsulosin 0.4 MG Caps capsule Commonly known as:  FLOMAX TAKE 1 CAPSULE BY MOUTH DAILY AFTER SUPPER.   triamcinolone cream 0.1 % Commonly known as:  KENALOG APPLY TO AFFECTED AREA TWICE A DAY What changed:  See the new instructions.   VENTOLIN HFA 108 (90 Base) MCG/ACT inhaler Generic drug:  albuterol INHALE 2 PUFFS INTO THE LUNGS EVERY 6 HOURS AS NEEDED FOR WHEEZING.      Follow-up Information    [name], MD Follow up.   Specialty:  Internal Medicine Why:  f/u on Monday for Bmet and BP check BUN/ Cr 112/ 1.79 Contact information: [place] [address] [phone]          Allergies  Allergen Reactions  . Amlodipine Nausea And Vomiting and Other (See Comments)    dizziness  . Hydrocodone Itching and Rash  . Other Nausea And Vomiting    STATES HE HAS NOT EATEN ANY FISH INCLUDING SHELLFISH FOR PAST 40 YRS - IT CAUSED NAUSEA  . Tylenol [Acetaminophen] Other (See Comments)    Liver problems     Procedures/Studies:    Dg Chest 1 View  Result Date: 01/20/2017 CLINICAL DATA:  Right-sided chest pain for 2 days.  Previous smoker. EXAM: CHEST 1 VIEW COMPARISON:  02/07/2014 FINDINGS: Postoperative changes in the mediastinum. Normal heart size and pulmonary vascularity. No focal airspace disease or consolidation in the lungs. No blunting of costophrenic angles. No pneumothorax. Mediastinal contours appear intact. IMPRESSION: No active disease. Electronically Signed   By: Lucienne Capers M.D.   On:  01/20/2017 22:16   US Renal  Result Date: 01/21/2017 CLINICAL DATA:  Acute renal failure.  History of hypertension. EXAM: RENAL / URINARY TRACT ULTRASOUND COMPLETE COMPARISON:  CT abdomen and pelvis November 02, 2013 FINDINGS: Right Kidney: Length: 11.9 cm. Cortical thinning and mildly increased echogenicity. Left Kidney: Length: 12.2 cm. Cortical thinning and mildly increased echogenicity. Bladder: Appears normal for degree of bladder distention. Prostatomegaly. IMPRESSION: Mild cortical atrophy and medical renal disease. Electronically Signed   By: Elon Alas M.D.   On: 01/21/2017 04:09   [employer] [facility]  Result Date: 01/21/2017 CLINICAL DATA:  Weakness for 3 weeks. Known abdominal aortic aneurysm. EXAM: ULTRASOUND OF ABDOMINAL AORTA TECHNIQUE: Ultrasound examination of the abdominal aorta was performed to evaluate for abdominal aortic aneurysm. COMPARISON:  CT  11/02/2013 FINDINGS: Abdominal aortic measurements as follows: Proximal:  2.6 cm Mid:  2 cm Distal:  3.1 cm IMPRESSION: A ectatic abdominal aorta with maximal AP diameter 3.1 cm in the distal portion. No significant change since previous CT. Electronically Signed   By: Lucienne Capers M.D.   On: 01/21/2017 03:16       Discharge Exam: Vitals:   01/22/17 0454 01/22/17 0700  BP: (!) 86/34 (!) 102/41  Pulse:  75  Resp:    Temp:    SpO2:     Vitals:   01/21/17 2216 01/22/17 0438 01/22/17 0454 01/22/17 0700  BP: 114/64 (!) 90/31 (!) 86/34 (!) 102/41  Pulse: (!) 44 (!) 44  75  Resp: 16 14    Temp: (!) 97.5 F (36.4 C) 98.3 F (36.8 C)    TempSrc: Oral Oral    SpO2: 99% 100%    Weight: 75 kg (165 lb 5.5 oz)     Height:        General: Pt is alert, awake, not in acute distress Cardiovascular: RRR, S1/S2 +, no rubs, no gallops Respiratory: CTA bilaterally, no wheezing, no rhonchi Abdominal: Soft, NT, ND, bowel sounds + Extremities: no edema, no cyanosis    The results of significant diagnostics from this  hospitalization (including imaging, microbiology, ancillary and laboratory) are listed below for reference.     Microbiology: No results found for this or any previous visit (from the past 240 hour(s)).   Labs: BNP (last 3 results)  Recent Labs  01/21/17 0215  BNP 62.9   Basic Metabolic Panel:  Recent Labs Lab 01/20/17 2108 01/21/17 1020 01/22/17 0249  NA 134* 135 136  K 5.4* 4.4 4.4  CL 108 108 104  CO2 15* 17* 23  GLUCOSE 140* 97 98  BUN 139* 125* 112*  CREATININE 2.63* 1.98* 1.79*  CALCIUM 9.0 8.3* 7.9*   Liver Function Tests: No results for input(s): AST, ALT, ALKPHOS, BILITOT, PROT, ALBUMIN in the last 168 hours. No results for input(s): LIPASE, AMYLASE in the last 168 hours. No results for input(s): AMMONIA in the last 168 hours. CBC:  Recent Labs Lab 01/20/17 2108 01/22/17 0249  WBC 11.4* 5.7  HGB 11.0* 8.7*  HCT 33.3* 25.2*  MCV 85.6 84.0  PLT 214 140*   Cardiac Enzymes:  Recent Labs Lab 01/21/17 0123  CKTOTAL 88   BNP: Invalid input(s): POCBNP CBG: No results for input(s): GLUCAP in the last 168 hours. D-Dimer No results for input(s): DDIMER in the last 72 hours. Hgb A1c No results for input(s): HGBA1C in the last 72 hours. Lipid Profile No results for input(s): CHOL, HDL, LDLCALC, TRIG, CHOLHDL, LDLDIRECT in the last 72 hours. Thyroid function studies No results for input(s): TSH, T4TOTAL, T3FREE, THYROIDAB in the last 72 hours.  Invalid input(s): FREET3 Anemia work up No results for input(s): VITAMINB12, FOLATE, FERRITIN, TIBC, IRON, RETICCTPCT in the last 72 hours. Urinalysis    Component Value Date/Time   COLORURINE YELLOW 01/21/2017 0450   APPEARANCEUR CLEAR 01/21/2017 0450   LABSPEC 1.011 01/21/2017 0450   PHURINE 5.0 01/21/2017 0450   GLUCOSEU NEGATIVE 01/21/2017 0450   HGBUR NEGATIVE 01/21/2017 0450   BILIRUBINUR NEGATIVE 01/21/2017 0450   KETONESUR NEGATIVE 01/21/2017 0450   PROTEINUR NEGATIVE 01/21/2017 0450    UROBILINOGEN 1.0 07/28/2013 2005   NITRITE NEGATIVE 01/21/2017 0450   LEUKOCYTESUR NEGATIVE 01/21/2017 0450   Sepsis Labs Invalid input(s): PROCALCITONIN,  WBC,  LACTICIDVEN Microbiology No results found for this or any previous visit (  from the past 240 hour(s)).   Time coordinating discharge: Over 30 minutes  SIGNED:   Debbe Odea, MD  [facility] 01/22/2017, 10:23 AM Pager   If 7PM-7AM, please contact night-coverage [email or web address] Password [email or web address]

## 2017-01-22 NOTE — Care Management CC44 (Signed)
Condition Code 44 Documentation Completed  Patient Details  Name: [name] MRN: [id number] Date of Birth: [birth year]   Condition Code 44 given:  Yes Patient signature on Condition Code 44 notice:  Yes Documentation of 2 MD's agreement:  Yes Code 44 added to claim:  Yes    RoyalRory [place], RN 01/22/2017, 12:23 PM

## 2017-01-22 NOTE — Care Management Obs Status (Signed)
[facility] NOTIFICATION   Patient Details  Name: NICCO REAUME MRN: [id number] Date of Birth: [birth year]   Medicare Observation Status Notification Given:  Yes    Danijah Noh, Rory [place], RN 01/22/2017, 12:23 PM

## 2017-01-22 NOTE — Evaluation (Signed)
Physical Therapy Evaluation Patient Details Name: VERLIE HELLENBRAND MRN: [id number] DOB: Mar 15, 1941 Today's Date: 01/22/2017   History of Present Illness  Pt is a 76 y/o male admitted secondary to R arm pain and acute renal failure superimposed on CKD. PMH includes cerebral palsy, AAA, CAD s/p CABG, COPD, CHF, liver cirrhosis, and MI.   Clinical Impression  Pt admitted secondary to problem above with deficits below. PTA, pt reports using rollator occasionally with mobility. Reported he has cerebral palsy at baseline. Upon eval, pt presenting with weakness, decreased balance, fatigue, and decreased activity tolerance. Required min to min guard assist for mobility. Educated to use rollator when walking around to increase safety. Also reported he has prescription for scooter; educated to use scooter for longer distances secondary to increased fatigue. Recommending HHPT at d/c, however, pt likely to decline. Will continue to follow acutely to maximize functional mobility independence and safety.     Follow Up Recommendations Home health PT;Supervision for mobility/OOB (pt will likely refuse )    Equipment Recommendations  None recommended by PT    Recommendations for Other Services       Precautions / Restrictions Precautions Precautions: Fall Restrictions Weight Bearing Restrictions: No      Mobility  Bed Mobility               General bed mobility comments: Sitting EOB upon entry   Transfers Overall transfer level: Needs assistance Equipment used: None Transfers: Sit to/from Stand Sit to Stand: Min guard         General transfer comment: Min guard for steadying assist.   Ambulation/Gait Ambulation/Gait assistance: Min assist;Min guard Ambulation Distance (Feet): 100 Feet Assistive device: Rolling walker (2 wheeled) Gait Pattern/deviations: Step-through pattern;Decreased stride length;Trunk flexed;Drifts right/left;Wide base of support (CP gait ) Gait velocity:  Decreased Gait velocity interpretation: Below normal speed for age/gender General Gait Details: Pt presenting with decreased balance without use of AD and required min A therefore, used RW for remainder of gait training. Presenting with gait typical of cerebral palsy which pt reports is baseline. Decreased assist with use of RW to min guard for safety. Required verbal cues for proximity to device. Educated to use rollator at home. Reports MD wrote a prescription for a scooter. Notified case management, and case management gave instructions about how to obtain. Educated about use of scooter for longer distances to increase safety. Distance limited secondary to fatigue   Stairs            Wheelchair Mobility    Modified Rankin (Stroke Patients Only)       Balance Overall balance assessment: Needs assistance Sitting-balance support: No upper extremity supported;Feet supported Sitting balance-Leahy Scale: Good     Standing balance support: Bilateral upper extremity supported;No upper extremity supported;During functional activity Standing balance-Leahy Scale: Fair Standing balance comment: able to maintain static standing without AD                              Pertinent Vitals/Pain Pain Assessment: No/denies pain    Home Living Family/patient expects to be discharged to:: Private residence Living Arrangements: Alone Available Help at Discharge: Family;Other (Comment) (in and out throughout most of the day ) Type of Home: Mobile home Home Access: Stairs to enter Entrance Stairs-Rails: Right;Left;Can reach both Entrance Stairs-Number of Steps: 3 Home Layout: One level Home Equipment: Bedside commode;Shower seat;Walker - 4 wheels      Prior Function Level of Independence: Independent  with assistive device(s)         Comments: Sometimes uses rollator, however other times does not.      Hand Dominance   Dominant Hand: Right    Extremity/Trunk Assessment    Upper Extremity Assessment Upper Extremity Assessment: Generalized weakness    Lower Extremity Assessment Lower Extremity Assessment: Generalized weakness    Cervical / Trunk Assessment Cervical / Trunk Assessment: Kyphotic  Communication   Communication: No difficulties  Cognition Arousal/Alertness: Awake/alert Behavior During Therapy: WFL for tasks assessed/performed Overall Cognitive Status: Within Functional Limits for tasks assessed                                        General Comments General comments (skin integrity, edema, etc.): Educated about assist needed for stair management and use of step to technique to increase safety. Unable to attempt stairs as pt easily fatigued. Educated about HHPT, however, pt reporting he does not believe he needs it.     Exercises     Assessment/Plan    PT Assessment Patient needs continued PT services  PT Problem List Decreased strength;Decreased balance;Decreased mobility;Decreased coordination;Decreased knowledge of use of DME;Decreased knowledge of precautions       PT Treatment Interventions Gait training;DME instruction;Stair training;Functional mobility training;Therapeutic exercise;Balance training;Therapeutic activities;Neuromuscular re-education;Patient/family education    PT Goals (Current goals can be found in the Care Plan section)  Acute Rehab PT Goals Patient Stated Goal: to go home today  PT Goal Formulation: With patient Time For Goal Achievement: 01/29/17 Potential to Achieve Goals: Good    Frequency Min 3X/week   Barriers to discharge Decreased caregiver support Lives alone, however, reports family and girlfriend will be checking in on him throughout the day     Co-evaluation               AM-PAC PT "6 Clicks" Daily Activity  Outcome Measure Difficulty turning over in bed (including adjusting bedclothes, sheets and blankets)?: None Difficulty moving from lying on back to sitting on  the side of the bed? : None Difficulty sitting down on and standing up from a chair with arms (e.g., wheelchair, bedside commode, etc,.)?: Unable Help needed moving to and from a bed to chair (including a wheelchair)?: A Little Help needed walking in hospital room?: A Little Help needed climbing 3-5 steps with a railing? : A Lot 6 Click Score: 17    End of Session Equipment Utilized During Treatment: Gait belt Activity Tolerance: Patient limited by fatigue Patient left: in bed;with call bell/phone within reach (sitting EOB ) Nurse Communication: Mobility status PT Visit Diagnosis: Unsteadiness on feet (R26.81);Other symptoms and signs involving the nervous system (R29.898);Difficulty in walking, not elsewhere classified (R26.2)    Time: 4235-3614 PT Time Calculation (min) (ACUTE ONLY): 20 min   Charges:   PT Evaluation $PT Eval Moderate Complexity: 1 Mod     PT G Codes:   PT G-Codes **NOT FOR INPATIENT CLASS** Functional Assessment Tool Used: AM-PAC 6 Clicks Basic Mobility;Clinical judgement Functional Limitation: Mobility: Walking and moving around Mobility: Walking and Moving Around Current Status (E3154): At least 40 percent but less than 60 percent impaired, limited or restricted Mobility: Walking and Moving Around Goal Status [phone]): At least 1 percent but less than 20 percent impaired, limited or restricted    Leighton Ruff, PT, DPT  Acute Rehabilitation Services  Pager: [place] 01/22/2017, 12:35  PM   

## 2017-01-22 NOTE — Telephone Encounter (Signed)
Pt was released from the hospital this afternoon after having an allergic reaction to an antibiotic. Dr Wynelle Cleveland would like for him to be seen on Monday for a hospital follow up with Dr Sharlet Salina. Is there anywhere that we could put him in? Otherwise, the first available is on Thursday. Please advise.

## 2017-01-22 NOTE — Care Management Obs Status (Signed)
[place] NOTIFICATION   Patient Details  Name: [name] MRN: [id number] Date of Birth: May 23, 1940   Medicare Observation Status Notification Given:       Adron Bene, RN 01/22/2017, 12:19 PM

## 2017-01-22 NOTE — Care Management Note (Signed)
Case Management Note  Patient Details  Name: PRANIT OWENSBY MRN: [id number] Date of Birth: 21-Dec-1940  Subjective/Objective:      CM following for progression and d/c planning.               Action/Plan: 01/22/2017 Met with pt re d/c needs, PT working with pt and recommended HHPT however pt declined. Pt asking about ordering a scooter, his PCP has provided him with a prescription. This CM gave pt and family member the location of [facility] per their request.  Pt signed Code 44 and verbalized understanding of change in status from Inpt to Obs. Pt currently awaiting d/c to home.   Expected Discharge Date:  01/22/17               Expected Discharge Plan:  Home/Self Care  In-House Referral:  NA  Discharge planning Services  CM Consult  Post Acute Care Choice:  NA Choice offered to:  NA  DME Arranged:  N/A DME Agency:  NA  HH Arranged:  NA HH Agency:  NA  Status of Service:  Completed, signed off  If discussed at [place] of Stay Meetings, dates discussed:    Additional Comments:  Adron Bene, RN 01/22/2017, 12:25 PM

## 2017-01-23 ENCOUNTER — Telehealth: Payer: Self-pay | Admitting: *Deleted

## 2017-01-23 MED ORDER — FENTANYL 100 MCG/HR TD PT72: 100.0000 ug | MEDICATED_PATCH | TRANSDERMAL | 0 refills | Status: DC

## 2017-01-23 NOTE — Telephone Encounter (Signed)
Transition Care Management Follow-up Telephone Call   Date discharged? 01/22/17   How have you been since you were released from the hospital? Pt states he is ok, he guess for old man..   Do you understand why you were in the hospital? YES   Do you understand the discharge instructions? YES   Where were you discharged to? Home   Items Reviewed:  Medications reviewed: YES  Allergies reviewed: YES  Dietary changes reviewed: NO  Referrals reviewed: No referral needed   Functional Questionnaire:   Activities of Daily Living (ADLs):   He states he are independent in the following: bathing and hygiene, feeding, continence, grooming, toileting and dressing States he require assistance with the following: ambulation   Any transportation issues/concerns?: NO   Any patient concerns? NO   Confirmed importance and date/time of follow-up visits scheduled YES, appt 01/26/17  Provider Appointment booked with Dr. Sharlet Salina   Confirmed with patient if condition begins to worsen call PCP or go to the ER.  Patient was given the office number and encouraged to call back with question or concerns.  : YES

## 2017-01-23 NOTE — Telephone Encounter (Signed)
Okay to pick up fentanyl. Printed and signed.

## 2017-01-23 NOTE — Telephone Encounter (Signed)
Patient states that his fentanyl patches run out on Sunday and wants to know if he can have a refill or if he is good to wait till Monday for his appointment. Patient wants to know if he can be re-evaluated for the oxycodone he has been in horrible pain.

## 2017-01-23 NOTE — Telephone Encounter (Signed)
Can be put Monday at 4pm

## 2017-01-23 NOTE — Telephone Encounter (Signed)
Patient called and informed it would be ready for his daughter in law to pick up for him

## 2017-01-23 NOTE — Telephone Encounter (Signed)
Pt had some questions about some of the medications that he has been on and what was done in the hospital. He said that one of them, he would run out of on Sunday. Would you be able to call him to discuss this?

## 2017-01-26 ENCOUNTER — Encounter: Payer: Self-pay | Admitting: Cardiology

## 2017-01-26 ENCOUNTER — Other Ambulatory Visit (INDEPENDENT_AMBULATORY_CARE_PROVIDER_SITE_OTHER): Payer: Medicare Other

## 2017-01-26 ENCOUNTER — Ambulatory Visit (INDEPENDENT_AMBULATORY_CARE_PROVIDER_SITE_OTHER): Payer: Medicare Other | Admitting: Internal Medicine

## 2017-01-26 VITALS — BP 130/60 | HR 92 | Temp 97.7°F | Ht 72.0 in | Wt 174.0 lb

## 2017-01-26 DIAGNOSIS — G8929 Other chronic pain: Secondary | ICD-10-CM

## 2017-01-26 DIAGNOSIS — I10 Essential (primary) hypertension: Secondary | ICD-10-CM | POA: Diagnosis not present

## 2017-01-26 DIAGNOSIS — M549 Dorsalgia, unspecified: Secondary | ICD-10-CM | POA: Diagnosis not present

## 2017-01-26 DIAGNOSIS — N179 Acute kidney failure, unspecified: Secondary | ICD-10-CM

## 2017-01-26 DIAGNOSIS — J42 Unspecified chronic bronchitis: Secondary | ICD-10-CM

## 2017-01-26 DIAGNOSIS — N183 Chronic kidney disease, stage 3 (moderate): Secondary | ICD-10-CM

## 2017-01-26 LAB — COMPREHENSIVE METABOLIC PANEL
ALT: 16 U/L (ref 0–53)
AST: 18 U/L (ref 0–37)
Albumin: 3.4 g/dL — ABNORMAL LOW (ref 3.5–5.2)
Alkaline Phosphatase: 81 U/L (ref 39–117)
BUN: 25 mg/dL — ABNORMAL HIGH (ref 6–23)
CO2: 27 mEq/L (ref 19–32)
Calcium: 8.5 mg/dL (ref 8.4–10.5)
Chloride: 104 mEq/L (ref 96–112)
Creatinine, Ser: 1.44 mg/dL (ref 0.40–1.50)
GFR: 50.94 mL/min — ABNORMAL LOW (ref >60.00)
Glucose, Bld: 116 mg/dL — ABNORMAL HIGH (ref 70–99)
Potassium: 3.9 mEq/L (ref 3.5–5.1)
Sodium: 141 mEq/L (ref 135–145)
Total Bilirubin: 0.7 mg/dL (ref 0.2–1.2)
Total Protein: 6.2 g/dL (ref 6.0–8.3)

## 2017-01-26 LAB — CBC
HCT: 28 % — ABNORMAL LOW (ref 39.0–52.0)
Hemoglobin: 9.2 g/dL — ABNORMAL LOW (ref 13.0–17.0)
MCHC: 32.9 g/dL (ref 30.0–36.0)
MCV: 88.9 fl (ref 78.0–100.0)
Platelets: 184 10*3/uL (ref 150.0–400.0)
RBC: 3.15 Mil/uL — ABNORMAL LOW (ref 4.22–5.81)
RDW: 13.8 % (ref 11.5–15.5)
WBC: 7.4 10*3/uL (ref 4.0–10.5)

## 2017-01-26 MED ORDER — HYDROCODONE-ACETAMINOPHEN 5-325 MG PO TABS: 1.0000 | ORAL_TABLET | Freq: Four times a day (QID) | ORAL | 0 refills | Status: DC | PRN

## 2017-01-26 MED ORDER — TRAMADOL HCL 50 MG PO TABS: 50.0000 mg | ORAL_TABLET | Freq: Three times a day (TID) | ORAL | 0 refills | Status: DC | PRN

## 2017-01-26 NOTE — Progress Notes (Signed)
   Subjective:    Patient ID: [name], male    DOB: [birth year], 76 y.o.   MRN: [id number]  HPI The patient is a 76 YO man coming in for hospital follow up (in with GI concerns and AKI, possibly related to recent doxycycline causing the GI symptoms, treated and kidney function improving but not back to normal at discharge). He has been changed on pain medication. He is still using fentanyl patch which is helping some. He was taken off oxycodone at the hospital due to concerns of over medication. He denies side effects with them. He is having severe shoulder pain as well as some back pain. He was given voltaren gel which is not helping much. Denies fevers or chills. Appetite is going back to normal. No diarrhea or constipation. Denies blood in stool.   PMH, [facility], social history reviewed and updated.   Review of Systems  Constitutional: Positive for activity change and appetite change. Negative for fatigue, fever and unexpected weight change.  HENT: Negative.   Eyes: Negative.   Respiratory: Negative for cough, chest tightness and shortness of breath.   Cardiovascular: Negative for chest pain, palpitations and leg swelling.  Gastrointestinal: Negative for abdominal distention, abdominal pain, constipation, diarrhea, nausea and vomiting.  Musculoskeletal: Positive for arthralgias and back pain. Negative for gait problem, joint swelling and myalgias.  Skin: Negative.   Neurological: Negative.   Psychiatric/Behavioral: Negative.       Objective:   Physical Exam  Constitutional: He is oriented to person, place, and time. He appears well-developed and well-nourished.  HENT:  Head: Normocephalic and atraumatic.  Eyes: EOM are normal.  Neck: Normal range of motion.  Cardiovascular: Normal rate and regular rhythm.   Pulmonary/Chest: Effort normal. No respiratory distress. He has wheezes. He has no rales.  Some chronic wheezing which is stable  Abdominal: Soft. Bowel sounds are normal. He  exhibits no distension. There is no tenderness. There is no rebound.  Musculoskeletal: He exhibits tenderness. He exhibits no edema.  Right shoulder pain  Neurological: He is alert and oriented to person, place, and time. Coordination normal.  Skin: Skin is warm and dry.  Psychiatric: He has a normal mood and affect.   Vitals:   01/26/17 1611  BP: 130/60  Pulse: 92  Temp: 97.7 F (36.5 C)  TempSrc: Oral  SpO2: 100%  Weight: 174 lb (78.9 kg)  Height: 6' (1.829 m)      Assessment & Plan:

## 2017-01-26 NOTE — Patient Instructions (Signed)
We have given you the pain medicine to use up to 3 times a day as needed for the pain on top of the fentanyl.   We are checking the labs today and will call you back with the results.

## 2017-01-26 NOTE — Progress Notes (Deleted)
HPI: FU CAD; s/p NSTEMI followed by CABG 10/2008 (L-LAD, S-OM1/L PL br, S-RCA), AAA, carotid stenosis. Beta blocker previously held due to Mobitz Type 1. Patient had atrial flutter ablation in September 2016. Dr. Caryl Comes ordered a Holter monitor that showed significant pauses and heart block but patient was asleep and conservative management felt indicated. Recently admitted with acute on chronic renal insuff (BUN/Cr 139/2.63) and ARB and lasix held. Since last seen,   Studies: - LHC (09/2008): 3 v CAD, EF 65% =>CABG  - Echo (10/17): Normal LV function, Mild aortic stenosis with mean gradient 11 mmHg, mild left atrial enlargement. - Carotid US (7/35): RICA 3-29%; LICA 92-42% - f/u 1 years  - Abdominal Ultrasound 8/18 showed dilatation of 3 x 3.2 cm. Follow-up recommended 1 year.  Current Outpatient Prescriptions  Medication Sig Dispense Refill  . aspirin EC 81 MG tablet Take 1 tablet (81 mg total) by mouth daily.    . famotidine (PEPCID) 20 MG tablet Take 1 tablet (20 mg total) by mouth 2 (two) times daily as needed for heartburn or indigestion.    . fentaNYL (DURAGESIC - DOSED MCG/HR) 100 MCG/HR Place 1 patch (100 mcg total) onto the skin every 3 (three) days. 10 patch 0  . finasteride (PROSCAR) 5 MG tablet TAKE 1 TABLET (5 MG TOTAL) BY MOUTH EVERY EVENING. 90 tablet 1  . fluticasone (FLONASE) 50 MCG/ACT nasal spray PLACE 1 SPRAY INTO BOTH NOSTRILS EVERY MORNING. (Patient taking differently: PLACE 1 SPRAY INTO BOTH NOSTRILS EVERY MORNING AS NEEDED FOR ALLERGIES) 16 g 3  . guaiFENesin (MUCINEX) 600 MG 12 hr tablet Take 1,200 mg by mouth 2 (two) times daily as needed for cough or to loosen phlegm.     . hydrALAZINE (APRESOLINE) 10 MG tablet Take 1 tablet (10 mg total) by mouth 3 (three) times daily. 270 tablet 3  . nystatin ointment (MYCOSTATIN) APPLY TO AFFECTED AREA TWICE A DAY (Patient taking differently: APPLY TO AFFECTED AREA TWICE A DAY AS NEEDED FOR RASH) 30 g 0  . polyethylene  glycol (MIRALAX / GLYCOLAX) packet TAKE 17 G BY MOUTH DAILY. AS DIRECTED (Patient taking differently: TAKE 17 G BY MOUTH DAILY AS NEEDED FOR CONSTIPATION) 90 packet 1  . pravastatin (PRAVACHOL) 20 MG tablet Take 1 tablet (20 mg total) by mouth daily. 90 tablet 3  . sertraline (ZOLOFT) 25 MG tablet TAKE 1 TABLET BY MOUTH EVERYDAY AT BEDTIME (Patient taking differently: TAKE 1 TABLET BY MOUTH EVERYDAY AS NEEDED FOR DEPRESSION) 30 tablet 3  . tamsulosin (FLOMAX) 0.4 MG CAPS capsule TAKE 1 CAPSULE BY MOUTH DAILY AFTER SUPPER. 90 capsule 1  . triamcinolone cream (KENALOG) 0.1 % APPLY TO AFFECTED AREA TWICE A DAY (Patient taking differently: APPLY TO AFFECTED AREA TWICE A DAY AS NEEDED FOR RASH) 30 g 0  . VENTOLIN HFA 108 (90 Base) MCG/ACT inhaler INHALE 2 PUFFS INTO THE LUNGS EVERY 6 HOURS AS NEEDED FOR WHEEZING. 54 Inhaler 1   No current facility-administered medications for this visit.      Past Medical History:  Diagnosis Date  . AAA (abdominal aortic aneurysm) ([place])   . AAA (abdominal aortic aneurysm) ([place])   . Blindness of left eye   . 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 CAD (coronary artery disease)    s/p CABG 10/2008  . Carotid stenosis   . Cerebrovascular disease   . CHF (congestive heart failure) ([place])   . Cirrhosis ([place])    on CT  a/p 07/2013, no longer drinking  . CONGENITAL UNSPEC REDUCTION DEFORMITY LOWER LIMB   . Constipation due to opioid therapy 2014   began about a year ago  . COPD (chronic obstructive pulmonary disease) ([place])   . Foley catheter in place   . HTN (hypertension)   . Hyperlipidemia   . 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 Neuromuscular disorder ([place])    PT STATES HE HAS BEEN TOLD HE EITHER HAD POLIO OR CEREBRAL PALSY - STATES HIS LEFT LEG IS SHORTER AND AFFECTS HIS BALANCE - HE WEARS ELEVATED SHOE -LEFT  . Pain    BACK, LEGS AND ARMS  .  Shortness of breath    EXERTION  . TOBACCO USE, QUIT     Past Surgical History:  Procedure Laterality Date  . CORONARY ARTERY BYPASS GRAFT  11/03/2008   Ricard Dillon - x4: left internal mammary artery to the distal left anterior descending, saphemous vein graft to the first circumflex marginal branch with a Y graft sequentiallly to a left posterolateral branch, saphenous vein graft to the distal right coronary artery  . ELECTROPHYSIOLOGIC STUDY N/A 01/18/2015   Procedure: A-Flutter Ablation;  Surgeon: Deboraha Sprang, MD;  Location: [facility];  Service: Cardiovascular;  Laterality: N/A;  . GREEN LIGHT LASER TURP (TRANSURETHRAL RESECTION OF PROSTATE N/A 02/14/2014   Procedure: GREEN LIGHT LASER TURP (TRANSURETHRAL RESECTION OF PROSTATE;  Surgeon: Festus Aloe, MD;  Location: [facility];  Service: Urology;  Laterality: N/A;    Social History   Social History  . Marital status: Widowed    Spouse name: N/A  . Number of children: N/A  . Years of education: N/A   Occupational History  . Not on file.   Social History Main Topics  . Smoking status: Former Research scientist (life sciences)  . Smokeless tobacco: Never Used  . Alcohol use No     Comment: History of heavy alcohol use per pt. Quit many years ago1/1/ 2005  . Drug use: No  . Sexual activity: No   Other Topics Concern  . Not on file   Social History Narrative   lives alone here in [place].  He has 1 son, who lives in the [place], and he has     an uncle, who lives nearby as well.     Family History  Problem Relation Age of Onset  . Cirrhosis Mother        died at 27  . Colon cancer Neg Hx   . Stomach cancer Neg Hx     ROS: no fevers or chills, productive cough, hemoptysis, dysphasia, odynophagia, melena, hematochezia, dysuria, hematuria, rash, seizure activity, orthopnea, PND, pedal edema, claudication. Remaining systems are negative.  Physical Exam: Well-developed well-nourished in no acute distress.  Skin is warm and dry.    HEENT is normal.  Neck is supple.  Chest is clear to auscultation with normal expansion.  Cardiovascular exam is regular rate and rhythm.  Abdominal exam nontender or distended. No masses palpated. Extremities show no edema. neuro grossly intact  ECG- personally reviewed  A/P  1  Kirk Ruths, MD

## 2017-01-27 ENCOUNTER — Telehealth: Payer: Self-pay

## 2017-01-27 NOTE — Telephone Encounter (Signed)
Needs to try tramadol first.

## 2017-01-27 NOTE — Telephone Encounter (Signed)
Pt called back and was informed of MD response, patient states he does not see the point in trying something that doesn't work,  he said Interior and spatial designer prescribred it and it did not work, he would like to ask for dr. Sharlet Salina to prescribe it, he states he just wants help. Please advise

## 2017-01-27 NOTE — Telephone Encounter (Signed)
Patient called this morning stating that the tramadol does not even touch his pain and since he cannot have hydrocodone or tylenol he was put on oxycodone and that was working with his pain with the fentanyl patches. Please advise.

## 2017-01-28 ENCOUNTER — Other Ambulatory Visit: Payer: Self-pay | Admitting: Internal Medicine

## 2017-01-28 NOTE — Telephone Encounter (Signed)
Patients daughter picked up tramadol Rx

## 2017-01-28 NOTE — Assessment & Plan Note (Signed)
Needs follow up CBC and CMP today. He is eating normally again and no signs of dehydration.

## 2017-01-28 NOTE — Assessment & Plan Note (Signed)
No flare today. Previously was given doxycycline for flare and got severe GI concerns. Will avoid in the future.

## 2017-01-28 NOTE — Assessment & Plan Note (Signed)
BP at goal on his losartan and hydralazine. No lightheadedness. Checking CMP.

## 2017-01-28 NOTE — Assessment & Plan Note (Signed)
He is using fentanyl 100 mcg patch which is helping some with his chronic pain. Rx for tramadol to help with breakthrough which he has not tried in a long time.

## 2017-02-02 ENCOUNTER — Ambulatory Visit: Payer: Medicare Other | Admitting: Internal Medicine

## 2017-02-03 ENCOUNTER — Ambulatory Visit (INDEPENDENT_AMBULATORY_CARE_PROVIDER_SITE_OTHER): Payer: Medicare Other | Admitting: Internal Medicine

## 2017-02-03 ENCOUNTER — Encounter: Payer: Self-pay | Admitting: Internal Medicine

## 2017-02-03 VITALS — BP 160/70 | HR 79 | Temp 98.3°F | Ht 72.0 in | Wt 178.0 lb

## 2017-02-03 DIAGNOSIS — I10 Essential (primary) hypertension: Secondary | ICD-10-CM

## 2017-02-03 DIAGNOSIS — M549 Dorsalgia, unspecified: Secondary | ICD-10-CM | POA: Diagnosis not present

## 2017-02-03 DIAGNOSIS — I6529 Occlusion and stenosis of unspecified carotid artery: Secondary | ICD-10-CM

## 2017-02-03 DIAGNOSIS — G8929 Other chronic pain: Secondary | ICD-10-CM | POA: Diagnosis not present

## 2017-02-03 NOTE — Patient Instructions (Signed)
Take furosemide 1 pill twice a day (you can do morning and after lunch).   Take the potassium the same.   We will call the pharmacy and see if they will fill the tramadol to see if it could help.   We are also getting you in with the pain specialist to see if they can come up with a better long term solution.

## 2017-02-04 NOTE — Assessment & Plan Note (Signed)
Referral to pain management as we will likely not be able to manage his pain without a reasonable MME. Currently taking fentanyl 100 mcg daily. Will call pharmacy to find out why they would not fill his prescription. Previously on only oxycodone and talked to him about the fact that it is not safe to be on fentanyl and oxycodone.

## 2017-02-04 NOTE — Assessment & Plan Note (Signed)
BP above goal and will resume lasix 2 pills daily for 5 days then 1 pill daily. Do not restart losartan today. Checking CMP at next visit.

## 2017-02-04 NOTE — Progress Notes (Signed)
   Subjective:    Patient ID: [name], male    DOB: 03-13-1941, 76 y.o.   MRN: [id number]  HPI The patient is a 76 YO man coming in for swelling in his left leg associated with some mild weight gain. He denies SOB. Sleeping with 2 pillows which has not changed recently. Some SOB with exertion which is not new. He was sent home from the hospital without lasix or losartan.He has started taking lasix again for the swelling 1 day ago. This has helped some and produced good urine. He is trying to prop the leg up but this has only helped marginally.  He is also in chronic pain and worse since the pharmacy would not fill the tramadol we prescribed for some reason. He is on fentanyl patch but this was a dosage decrease from his prior amount of oxycodone so he is struggling with pain. He is willing to try tramadol if we can get the pharmacy to fill it.   Review of Systems  Constitutional: Positive for activity change. Negative for appetite change, fatigue and fever.  Eyes: Negative.   Respiratory: Positive for shortness of breath. Negative for cough and chest tightness.   Cardiovascular: Positive for leg swelling. Negative for chest pain and palpitations.  Gastrointestinal: Negative for abdominal distention, abdominal pain, constipation, diarrhea, nausea and vomiting.  Musculoskeletal: Positive for arthralgias and back pain.  Skin: Negative.   Neurological: Negative.       Objective:   Physical Exam  Constitutional: He is oriented to person, place, and time. He appears well-developed and well-nourished.  HENT:  Head: Normocephalic and atraumatic.  Eyes: EOM are normal.  Neck: Normal range of motion.  Cardiovascular: Normal rate and regular rhythm.   Pulmonary/Chest: Effort normal and breath sounds normal. No respiratory distress. He has no wheezes. He has no rales.  Abdominal: Soft. Bowel sounds are normal. He exhibits no distension. There is no tenderness. There is no rebound.    Musculoskeletal: He exhibits edema and tenderness.  Left leg1-2+ edema to the knee,right with trace edema to the shin, no calf tenderness  Neurological: He is alert and oriented to person, place, and time. Coordination normal.  Skin: Skin is warm and dry.   Vitals:   02/03/17 1557  BP: (!) 160/70  Pulse: 79  Temp: 98.3 F (36.8 C)  TempSrc: Oral  SpO2: 99%  Weight: 178 lb (80.7 kg)  Height: 6' (1.829 m)      Assessment & Plan:

## 2017-02-05 ENCOUNTER — Ambulatory Visit: Payer: Medicare Other | Admitting: Cardiology

## 2017-02-09 NOTE — Progress Notes (Deleted)
HPI: FU CAD; s/p NSTEMI followed by CABG 10/2008 (L-LAD, S-OM1/L PL br, S-RCA), AAA, carotid stenosis. Beta blocker previously held due to Mobitz Type 1. Patient had atrial flutter ablation in September 2016. Dr. Caryl Comes ordered a Holter monitor that showed significant pauses and heart block but patient was asleep and conservative management felt indicated. Patient was admitted October 2018 with weakness and found to have acute on chronic renal insufficiency. BUN was 139 and creatinine 2.63. Diuretics and ARB held. Renal function improved. Since last seen,  Studies: - LHC (09/2008): 3 v CAD, EF 65% =>CABG  - Echo (10/17): Normal LV function, Mild aortic stenosis with mean gradient 11 mmHg, mild left atrial enlargement. - Carotid US (9/89): RICA 2-11%; LICA 94-17%  - Abdominal Ultrasound 8/18 showed dilatation of 3 x 3.2 cm. Follow-up recommended 1 year.  Current Outpatient Prescriptions  Medication Sig Dispense Refill  . aspirin EC 81 MG tablet Take 1 tablet (81 mg total) by mouth daily.    [name] atorvastatin (LIPITOR) 40 MG tablet TAKE 1 TABLET (40 MG TOTAL) BY MOUTH DAILY AT 6 PM.  1  . famotidine (PEPCID) 20 MG tablet Take 1 tablet (20 mg total) by mouth 2 (two) times daily as needed for heartburn or indigestion.    . fentaNYL (DURAGESIC - DOSED MCG/HR) 100 MCG/HR Place 1 patch (100 mcg total) onto the skin every 3 (three) days. 10 patch 0  . finasteride (PROSCAR) 5 MG tablet TAKE 1 TABLET (5 MG TOTAL) BY MOUTH EVERY EVENING. 90 tablet 1  . fluticasone (FLONASE) 50 MCG/ACT nasal spray PLACE 1 SPRAY INTO BOTH NOSTRILS EVERY MORNING. 16 g 3  . furosemide (LASIX) 40 MG tablet TAKE 1 AND 1/2 TABLET BY MOUTH ONCE DAILY  0  . guaiFENesin (MUCINEX) 600 MG 12 hr tablet Take 1,200 mg by mouth 2 (two) times daily as needed for cough or to loosen phlegm.     . hydrALAZINE (APRESOLINE) 10 MG tablet Take 1 tablet (10 mg total) by mouth 3 (three) times daily. 270 tablet 3  . KLOR-CON M20 20 MEQ tablet  TAKE 2 TABLETS (40 MEQ TOTAL) BY MOUTH DAILY. EVERY EVENING  0  . losartan (COZAAR) 100 MG tablet     . nystatin ointment (MYCOSTATIN) APPLY TO AFFECTED AREA TWICE A DAY (Patient taking differently: APPLY TO AFFECTED AREA TWICE A DAY AS NEEDED FOR RASH) 30 g 0  . polyethylene glycol (MIRALAX / GLYCOLAX) packet TAKE 17 G BY MOUTH DAILY. AS DIRECTED (Patient taking differently: TAKE 17 G BY MOUTH DAILY AS NEEDED FOR CONSTIPATION) 90 packet 1  . pravastatin (PRAVACHOL) 20 MG tablet Take 1 tablet (20 mg total) by mouth daily. 90 tablet 3  . sertraline (ZOLOFT) 25 MG tablet TAKE 1 TABLET BY MOUTH EVERYDAY AT BEDTIME (Patient taking differently: TAKE 1 TABLET BY MOUTH EVERYDAY AS NEEDED FOR DEPRESSION) 30 tablet 3  . tamsulosin (FLOMAX) 0.4 MG CAPS capsule TAKE 1 CAPSULE BY MOUTH DAILY AFTER SUPPER. 90 capsule 1  . traMADol (ULTRAM) 50 MG tablet Take 1 tablet (50 mg total) by mouth every 8 (eight) hours as needed. 60 tablet 0  . triamcinolone cream (KENALOG) 0.1 % APPLY TO AFFECTED AREA TWICE A DAY (Patient taking differently: APPLY TO AFFECTED AREA TWICE A DAY AS NEEDED FOR RASH) 30 g 0  . VENTOLIN HFA 108 (90 Base) MCG/ACT inhaler INHALE 2 PUFFS INTO THE LUNGS EVERY 6 HOURS AS NEEDED FOR WHEEZING. 54 Inhaler 1   No current facility-administered  medications for this visit.      Past Medical History:  Diagnosis Date  . AAA (abdominal aortic aneurysm) ([place])   . AAA (abdominal aortic aneurysm) ([place])   . Blindness of left eye   . 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 CAD (coronary artery disease)    s/p CABG 10/2008  . Carotid stenosis   . Cerebrovascular disease   . CHF (congestive heart failure) ([place])   . Cirrhosis ([place])    on CT a/p 07/2013, no longer drinking  . CONGENITAL UNSPEC REDUCTION DEFORMITY LOWER LIMB   . Constipation due to opioid therapy 2014   began about a year ago  . COPD (chronic obstructive pulmonary disease) ([place])   . Foley catheter in place     . HTN (hypertension)   . Hyperlipidemia   . 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 Neuromuscular disorder ([place])    PT STATES HE HAS BEEN TOLD HE EITHER HAD POLIO OR CEREBRAL PALSY - STATES HIS LEFT LEG IS SHORTER AND AFFECTS HIS BALANCE - HE WEARS ELEVATED SHOE -LEFT  . Pain    BACK, LEGS AND ARMS  . Shortness of breath    EXERTION  . TOBACCO USE, QUIT     Past Surgical History:  Procedure Laterality Date  . CORONARY ARTERY BYPASS GRAFT  11/03/2008   Ricard Dillon - x4: left internal mammary artery to the distal left anterior descending, saphemous vein graft to the first circumflex marginal branch with a Y graft sequentiallly to a left posterolateral branch, saphenous vein graft to the distal right coronary artery  . ELECTROPHYSIOLOGIC STUDY N/A 01/18/2015   Procedure: A-Flutter Ablation;  Surgeon: Deboraha Sprang, MD;  Location: [facility];  Service: Cardiovascular;  Laterality: N/A;  . GREEN LIGHT LASER TURP (TRANSURETHRAL RESECTION OF PROSTATE N/A 02/14/2014   Procedure: GREEN LIGHT LASER TURP (TRANSURETHRAL RESECTION OF PROSTATE;  Surgeon: Festus Aloe, MD;  Location: [facility];  Service: Urology;  Laterality: N/A;    Social History   Social History  . Marital status: Widowed    Spouse name: N/A  . Number of children: N/A  . Years of education: N/A   Occupational History  . Not on file.   Social History Main Topics  . Smoking status: Former Research scientist (life sciences)  . Smokeless tobacco: Never Used  . Alcohol use No     Comment: History of heavy alcohol use per pt. Quit many years ago1/1/ 2005  . Drug use: No  . Sexual activity: No   Other Topics Concern  . Not on file   Social History Narrative   lives alone here in [place].  He has 1 son, who lives in the [place], and he has     an uncle, who lives nearby as well.     Family History  Problem Relation Age of Onset  . Cirrhosis Mother         died at 85  . Colon cancer Neg Hx   . Stomach cancer Neg Hx     ROS: no fevers or chills, productive cough, hemoptysis, dysphasia, odynophagia, melena, hematochezia, dysuria, hematuria, rash, seizure activity, orthopnea, PND, pedal edema, claudication. Remaining systems are negative.  Physical Exam: Well-developed well-nourished in no acute distress.  Skin is warm and dry.  HEENT is normal.  Neck is supple.  Chest is clear to auscultation with normal expansion.  Cardiovascular exam is regular rate  and rhythm.  Abdominal exam nontender or distended. No masses palpated. Extremities show no edema. neuro grossly intact  ECG- personally reviewed  A/P  1  Kirk Ruths, MD

## 2017-02-10 ENCOUNTER — Telehealth: Payer: Self-pay | Admitting: Internal Medicine

## 2017-02-10 ENCOUNTER — Ambulatory Visit: Payer: Medicare Other | Admitting: Cardiology

## 2017-02-10 NOTE — Telephone Encounter (Signed)
Pt called in and said that the tramadol he is taking is like eating candy corn. He stated it is not helping.  Also said his leg is still swollen and it is know swollen up to his knee.  What does he need to do?

## 2017-02-11 NOTE — Telephone Encounter (Signed)
Pt called stating he is in a lot of pain, states he cannot walk and his leg is more swollen. He has taken 2 Lasix. He was taking Oxycodone 7x per day and the tramadol is not helping his pain, please advise and he would like a call back.

## 2017-02-12 ENCOUNTER — Encounter: Payer: Self-pay | Admitting: *Deleted

## 2017-02-12 NOTE — Telephone Encounter (Signed)
Patient states he is already taking the 2 pills daily and propping legs up when he can. States he does not have a car right now to get here so he will call back to make an appointment when he can.

## 2017-02-12 NOTE — Telephone Encounter (Signed)
He should increase lasix to 2 pills daily. We would need to see him back to prescribe a different pain medicine now that he has tried the tramadol.

## 2017-02-13 ENCOUNTER — Ambulatory Visit (INDEPENDENT_AMBULATORY_CARE_PROVIDER_SITE_OTHER): Payer: Medicare Other | Admitting: Internal Medicine

## 2017-02-13 ENCOUNTER — Encounter: Payer: Self-pay | Admitting: Internal Medicine

## 2017-02-13 DIAGNOSIS — I10 Essential (primary) hypertension: Secondary | ICD-10-CM

## 2017-02-13 DIAGNOSIS — I6529 Occlusion and stenosis of unspecified carotid artery: Secondary | ICD-10-CM | POA: Diagnosis not present

## 2017-02-13 DIAGNOSIS — F1120 Opioid dependence, uncomplicated: Secondary | ICD-10-CM

## 2017-02-13 DIAGNOSIS — G8929 Other chronic pain: Secondary | ICD-10-CM

## 2017-02-13 DIAGNOSIS — M549 Dorsalgia, unspecified: Secondary | ICD-10-CM | POA: Diagnosis not present

## 2017-02-13 MED ORDER — OXYCODONE HCL 5 MG PO TABS: 5.0000 mg | ORAL_TABLET | Freq: Two times a day (BID) | ORAL | 0 refills | Status: DC | PRN

## 2017-02-13 NOTE — Assessment & Plan Note (Signed)
BP at goal on his lasix. He will increase to 3 pills daily lasix for fluid and then decrease back to 2 pills daily once swelling is gone.

## 2017-02-13 NOTE — Assessment & Plan Note (Signed)
Will change tramadol to oxycodone IR 5 mg. This is not a new medication and is chronic so will do BID prn for breakthrough pain for better function. Talked to him about assisted living as he is not able to do many iadls and some adls alone.

## 2017-02-13 NOTE — Patient Instructions (Addendum)
We will give you the oxycodone today to help.   Think about trying assisted living to get some more of the chores off your plate so you don't have to worry about it. This is not a bad thing and they do not bother you they are just available to help.

## 2017-02-13 NOTE — Assessment & Plan Note (Signed)
Has been referred to the pain clinic due to high MME. He is taking fentanyl 100 mcg patch and oxycodone BID prn for breakthrough. This has helped him to function in the past. He does not take more often than prescribed.

## 2017-02-13 NOTE — Progress Notes (Signed)
   Subjective:    Patient ID: [name], male    DOB: 1941/03/10, 76 y.o.   MRN: [id number]  HPI The patient is a 76 YO man coming in for adjustment of his chronic pain medications. He is currently taking fentanyl patch and has been on oxycodone in the past. He tried tramadol as a breakthrough pain medication but this was not effective. He did not get any relief and he is not able to function. He would like to try going back to oxycodone for breakthrough as this has been effective in the past. He denies accidentally taking too much or significant side effects.   Review of Systems  Constitutional: Positive for activity change and appetite change.  HENT: Negative.   Eyes: Negative.   Respiratory: Negative for cough, chest tightness and shortness of breath.   Cardiovascular: Positive for leg swelling. Negative for chest pain and palpitations.  Gastrointestinal: Negative for abdominal distention, abdominal pain, constipation, diarrhea, nausea and vomiting.  Musculoskeletal: Positive for arthralgias, back pain, gait problem and myalgias.  Skin: Negative.   Neurological: Positive for weakness. Negative for dizziness, light-headedness, numbness and headaches.  Psychiatric/Behavioral: Negative.       Objective:   Physical Exam  Constitutional: He is oriented to person, place, and time. He appears well-developed and well-nourished.  HENT:  Head: Normocephalic and atraumatic.  Eyes: EOM are normal.  Neck: Normal range of motion.  Cardiovascular: Normal rate and regular rhythm.   Pulmonary/Chest: Effort normal and breath sounds normal. No respiratory distress. He has no wheezes. He has no rales.  Abdominal: Soft. Bowel sounds are normal. He exhibits no distension. There is no tenderness. There is no rebound.  Musculoskeletal: He exhibits edema.  1-2+ edema to mid shin left leg, right with 1+ edema to mid shin  Neurological: He is alert and oriented to person, place, and time. Coordination  abnormal.  Unstable gait and slow to rise  Skin: Skin is warm and dry.  Psychiatric: He has a normal mood and affect.   Vitals:   02/13/17 1106  BP: 114/66  Pulse: 87  Temp: 97.8 F (36.6 C)  TempSrc: Oral  SpO2: 98%  Weight: 179 lb (81.2 kg)  Height: 6' (1.829 m)      Assessment & Plan:

## 2017-02-18 ENCOUNTER — Other Ambulatory Visit: Payer: Self-pay | Admitting: Cardiology

## 2017-02-18 ENCOUNTER — Telehealth: Payer: Self-pay | Admitting: Internal Medicine

## 2017-02-18 NOTE — Telephone Encounter (Signed)
Pt called in and said that he has lost his script for the power wheelchair.  He was in the hospital and has not been able to find it since he as been out.  He would like to know if you could sent [facility]   Fax number [phone] attn referral support

## 2017-02-18 NOTE — Telephone Encounter (Signed)
REFILL 

## 2017-02-19 ENCOUNTER — Other Ambulatory Visit: Payer: Self-pay | Admitting: Cardiology

## 2017-02-19 ENCOUNTER — Other Ambulatory Visit: Payer: Self-pay | Admitting: Internal Medicine

## 2017-02-19 ENCOUNTER — Telehealth: Payer: Self-pay | Admitting: Internal Medicine

## 2017-02-19 MED ORDER — FENTANYL 100 MCG/HR TD PT72: 100.0000 ug | MEDICATED_PATCH | TRANSDERMAL | 0 refills | Status: DC

## 2017-02-19 NOTE — Telephone Encounter (Signed)
done

## 2017-02-19 NOTE — Telephone Encounter (Signed)
Check [place] registry last filled 01/23/2017.MD is out of the office pls advise on refill.../lmb.Marland KitchenJohny Chess

## 2017-02-19 NOTE — Telephone Encounter (Signed)
Notified pt rx ready for pick-up.../lmb 

## 2017-02-19 NOTE — Telephone Encounter (Signed)
Pt called requesting a refill on fentaNYL (DURAGESIC - DOSED MCG/HR) 100 MCG/HR. Please call him when this is ready to be picked up.

## 2017-02-25 NOTE — Telephone Encounter (Signed)
Pt called back checking on this. Please advise.

## 2017-02-26 NOTE — Telephone Encounter (Signed)
[facility] to [facility], patient stated that he is in a lot of pain and the swelling is going up further in his legs and wants to make an appointment. Lost him on the phone shana is calling him back

## 2017-02-26 NOTE — Telephone Encounter (Signed)
Usually power wheelchair is a form not just a prescription. We can give him a script for power wheelchair but may not be covered without a visit to address.

## 2017-02-27 ENCOUNTER — Encounter: Payer: Self-pay | Admitting: Internal Medicine

## 2017-02-27 ENCOUNTER — Ambulatory Visit (INDEPENDENT_AMBULATORY_CARE_PROVIDER_SITE_OTHER): Payer: Medicare Other | Admitting: Internal Medicine

## 2017-02-27 DIAGNOSIS — I519 Heart disease, unspecified: Secondary | ICD-10-CM

## 2017-02-27 DIAGNOSIS — F1120 Opioid dependence, uncomplicated: Secondary | ICD-10-CM | POA: Diagnosis not present

## 2017-02-27 DIAGNOSIS — I6529 Occlusion and stenosis of unspecified carotid artery: Secondary | ICD-10-CM

## 2017-02-27 DIAGNOSIS — I5189 Other ill-defined heart diseases: Secondary | ICD-10-CM | POA: Insufficient documentation

## 2017-02-27 NOTE — Assessment & Plan Note (Signed)
Increase lasix to 80 mg qam and 40 mg qpm for the next week.

## 2017-02-27 NOTE — Assessment & Plan Note (Signed)
He is requesting refill of oxycodone today as he is running low on medication due to increased pain. Informed that we cannot fill early and he is not to exceed prescribed amount of medication for safety reasons.

## 2017-02-27 NOTE — Patient Instructions (Addendum)
We will increase the fluid pill lasix to 2 pills in the morning and 1pill in the afternoon.   We will fax in the prescription for the scooter. We have the number.

## 2017-02-27 NOTE — Progress Notes (Signed)
   Subjective:    Patient ID: [name], male    DOB: [birth year], 76 y.o.   MRN: [id number]  HPI The patient is a 76 YO man coming in for leg swelling. He is taking lasix 2 pills daily. Some increased urination from the medicine but not much. He is having some pain in the calves from the swelling. Denies rash. Some skin color change from chronic fluid. Denies SOB or more breathings problems.   Review of Systems  Constitutional: Negative.   HENT: Negative.   Respiratory: Negative for cough, chest tightness and shortness of breath.   Cardiovascular: Positive for leg swelling. Negative for chest pain and palpitations.  Gastrointestinal: Negative for abdominal distention, abdominal pain, constipation, diarrhea, nausea and vomiting.  Musculoskeletal: Positive for arthralgias and back pain.  Skin: Positive for color change.      Objective:   Physical Exam  Constitutional: He is oriented to person, place, and time. He appears well-developed and well-nourished.  HENT:  Head: Normocephalic and atraumatic.  Eyes: EOM are normal.  Neck: Normal range of motion.  Cardiovascular: Normal rate and regular rhythm.  Pulmonary/Chest: Effort normal and breath sounds normal. No respiratory distress. He has no wheezes. He has no rales.  Abdominal: Soft. Bowel sounds are normal. He exhibits no distension. There is no tenderness. There is no rebound.  Musculoskeletal: He exhibits edema.  1-2+ edema to mid shins bilaterally  Neurological: He is alert and oriented to person, place, and time. Coordination normal.  Skin: Skin is warm and dry.   Vitals:   02/27/17 1610  BP: 126/60  Pulse: 81  Temp: 97.9 F (36.6 C)  TempSrc: Oral  SpO2: 100%  Weight: 176 lb (79.8 kg)  Height: 6' (1.829 m)      Assessment & Plan:

## 2017-03-05 ENCOUNTER — Other Ambulatory Visit: Payer: Self-pay | Admitting: Internal Medicine

## 2017-03-07 ENCOUNTER — Other Ambulatory Visit: Payer: Self-pay | Admitting: Cardiology

## 2017-03-09 ENCOUNTER — Telehealth: Payer: Self-pay | Admitting: Internal Medicine

## 2017-03-09 MED ORDER — OXYCODONE HCL 5 MG PO TABS: 5.0000 mg | ORAL_TABLET | Freq: Two times a day (BID) | ORAL | 0 refills | Status: DC | PRN

## 2017-03-09 NOTE — Telephone Encounter (Signed)
Pt called requesting a refill on oxyCODONE (OXY IR/ROXICODONE) 5 MG immediate release tablet. He mentioned that the amount or dose was going to be changed? Please advise.  He said that he knows he is calling in early, but with the holiday coming up this weekend he wanted to make sure to give [place] enough time to get it sent in.

## 2017-03-09 NOTE — Telephone Encounter (Signed)
Patient requesting call back in regard to script. Please call at [phone].

## 2017-03-09 NOTE — Telephone Encounter (Signed)
Sent in refill so he can fill when due. As we talked about we cannot increase due to his other medication.

## 2017-03-09 NOTE — Telephone Encounter (Signed)
Notified pt rx has been sent his local pharmacy will be ready to be pick-up on 03/15/17...[name]

## 2017-03-11 ENCOUNTER — Telehealth: Payer: Self-pay | Admitting: Internal Medicine

## 2017-03-11 ENCOUNTER — Other Ambulatory Visit: Payer: Self-pay | Admitting: Cardiology

## 2017-03-11 NOTE — Telephone Encounter (Signed)
Tried calling patient back mailbox was full could not leave a message

## 2017-03-11 NOTE — Telephone Encounter (Signed)
furosemide (LASIX) 40 MG tablet 45 tablet 0 03/09/2017    Sig - Route: TAKE 1.5 TABLETS (60 MG TOTAL) BY MOUTH DAILY. PLEASE CONTACT OFFICE FOR ADDITIONAL REFILLS - Oral   Sent to pharmacy as: furosemide (LASIX) 40 MG tablet   E-Prescribing Status: Receipt confirmed by pharmacy (03/09/2017 10:17 AM EST)   Pharmacy   [id number] - [place], [address]

## 2017-03-11 NOTE — Telephone Encounter (Signed)
Tried calling patient back but mailbox is full. This is regarding his furosemide Dr. Sharlet Salina only wanted him to increase his furosemide to two pills in the AM and one pill in the afternoon for one week. He was then supposed to go back to his normal dosage of 1.5 pill daily. He will have to Call Dr. Jacalyn Lefevre office for further refills as Dr. Sharlet Salina has never refilled that Rx before. As for his Oxycodone, I am not sure what he wants to talk about, but Dr. Sharlet Salina is not going to increase dosage of Rx because it is unsafe to do so with his other medications

## 2017-03-11 NOTE — Telephone Encounter (Signed)
°*  STAT* If patient is at the pharmacy, call can be transferred to refill team.   1. Which medications need to be refilled? (please list name of each medication and dose if known) Furosemide   2. Which pharmacy/location (including street and city if local pharmacy) is medication to be sent to? [id number] [place], [place].  3. Do they need a 30 day or 90 day supply? 90  Patient only has tree left and would like a f/ u call

## 2017-03-11 NOTE — Telephone Encounter (Signed)
Spoke with patient. Informed him Rx was sent in 11/19. He is aware if this. PCP has been adjusting lasix dose. On 11/9, PCP increase lasix to 2 QAM and 1 QPM but did not update med list or Rx with pharmacy. Patient has since been running low on medication. His pharmacy told him it is not time to fill Rx b/c they never received updated prescription with increased dose per PCP. Patient called PCP office, who told him to call our office for refills and also told patient that PCP had only wanted him to take increased dose for 1 week, which patient was not aware of until his spoke with PCP's CMA today. Explained that I will [facility] and [facility] fill Rx but I cannot guarantee that insurance will pay for it. He will see B. Strader, PA on 11/26 and further dose adjustments, if needed, will be addressed at this time.   [facility] and advised med needs to be filled. They will notify patient.

## 2017-03-11 NOTE — Telephone Encounter (Signed)
Patient called back and informed that the lasix was supposed to be changed only for a week. States he is supposed to go to cardiology Monday and is going to give them a call to see if they will refill medication for him since he will be out of this tomorrow

## 2017-03-11 NOTE — Telephone Encounter (Signed)
Patient states Dr. Sharlet Salina has been increasing his dosage on furosemide.  States [facility] had changed script to three 40mg  a day due to increased fluid.  States [facility] will not fill script because dosage has not been changed on script and insurance will not allow.  Please follow up with patient in regard. Patient states he will be out of med tomorrow.   Patient also would like to talk about his script for oxycodone.

## 2017-03-11 NOTE — Telephone Encounter (Signed)
Patient called back about his Oxycodone states that he was never told that the dosage could not be increased because of medications he was on, but states that the patch is not lasting as long as it is supposed to and would like to talk about possibility of stopping the fentanyl patch and going just to oxycodone. States he is in severe pain and just wants to find what works. He apologizes that he keeps calling and asking about this, but just wants help to figure out what will work.

## 2017-03-15 NOTE — Progress Notes (Signed)
Cardiology Office Note    Date:  03/16/2017   ID:  [name], DOB Sep 18, 1940, MRN [id number]  PCP:  [name], MD  Cardiologist: Dr. Stanford Breed  Chief Complaint  Patient presents with  . Follow-up    Annual Visit    History of Present Illness:    [name] is a 76 y.o. male with past medical history of CAD (s/p CABG in 10/2008 with LIMA-LAD, SVG-OM1 with Y-graft to PL branch, and SVG-RCA), HTN, HLD, PAF (s/p flutter ablation in 2016), AAA, mild AS, Stage 3 CKD, and Mobitz Type 1 who presents to the office today for annual follow-up.   He was last examined by Dr. Stanford Breed in 01/2016 and reported doing well at that time, denying any recent chest pain or dyspnea with routine activities. BP was elevated at the time of his visit, therefore Hydralazine was increased from 25mg  BID to 10mg  TID.    He was hospitalized in 01/2017 for an AKI with creatinine peaking at 2.63, improved to 1.79 at the time of hospital discharge (at 1.44 on 01/26/2017). This was thought to be pre-renal in the setting of being on PO diuretics and poor oral intake due to nausea. Lasix and Losartan were held at the time of discharge. Lasix has since been restarted by his PCP in the interim.   In talking with the patient today, he reports overall doing well since his last office visit. He denies any recent chest discomfort or palpitations. Has baseline dyspnea on exertion but denies any acute worsening of his symptoms. No recent orthopnea or PND. He does have chronic lower extremity edema and has noticed acute worsening of his symptoms. Lasix was increased from 40 mg to 60 mg daily by his PCP but he has noted minimal improvement with this. He does not track his weight at home but this has been stable at 179 lbs in 01/2017 and at 173 lbs during today's visit. He does report consuming 6-7 cans of Dr.Pepper soda per day and having a high-sodium diet.   He does not check blood pressure regularly at home  but it is well controlled at 122/56 during today's visit. He reports good compliance with Hydralazine TID dosing.   Past Medical History:  Diagnosis Date  . AAA (abdominal aortic aneurysm) ([place])   . Blindness of left eye   . 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 CAD (coronary artery disease)    a. s/p CABG in 10/2008 with LIMA-LAD, SVG-OM1 with Y-graft to PL branch, and SVG-RCA  . Carotid stenosis   . Cerebrovascular disease   . CHF (congestive heart failure) ([place])   . Cirrhosis ([place])    on CT a/p 07/2013, no longer drinking  . CONGENITAL UNSPEC REDUCTION DEFORMITY LOWER LIMB   . Constipation due to opioid therapy 2014   began about a year ago  . COPD (chronic obstructive pulmonary disease) ([place])   . Foley catheter in place   . HTN (hypertension)   . Hyperlipidemia   . 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 Neuromuscular disorder ([place])    PT STATES HE HAS BEEN TOLD HE EITHER HAD POLIO OR CEREBRAL PALSY - STATES HIS LEFT LEG IS SHORTER AND AFFECTS HIS BALANCE - HE WEARS ELEVATED SHOE -LEFT  . Pain    BACK, LEGS AND ARMS  . Shortness of breath    EXERTION  . TOBACCO USE,  QUIT     Past Surgical History:  Procedure Laterality Date  . CORONARY ARTERY BYPASS GRAFT  11/03/2008   Ricard Dillon - x4: left internal mammary artery to the distal left anterior descending, saphemous vein graft to the first circumflex marginal branch with a Y graft sequentiallly to a left posterolateral branch, saphenous vein graft to the distal right coronary artery  . ELECTROPHYSIOLOGIC STUDY N/A 01/18/2015   Procedure: A-Flutter Ablation;  Surgeon: Deboraha Sprang, MD;  Location: [facility];  Service: Cardiovascular;  Laterality: N/A;  . GREEN LIGHT LASER TURP (TRANSURETHRAL RESECTION OF PROSTATE N/A 02/14/2014   Procedure: GREEN LIGHT LASER TURP (TRANSURETHRAL RESECTION OF PROSTATE;  Surgeon:  Festus Aloe, MD;  Location: [facility];  Service: Urology;  Laterality: N/A;    Current Medications: Outpatient Medications Prior to Visit  Medication Sig Dispense Refill  . aspirin EC 81 MG tablet Take 1 tablet (81 mg total) by mouth daily.    . famotidine (PEPCID) 20 MG tablet Take 1 tablet (20 mg total) by mouth 2 (two) times daily as needed for heartburn or indigestion.    . fentaNYL (DURAGESIC - DOSED MCG/HR) 100 MCG/HR Place 1 patch (100 mcg total) onto the skin every 3 (three) days. 10 patch 0  . finasteride (PROSCAR) 5 MG tablet TAKE 1 TABLET (5 MG TOTAL) BY MOUTH EVERY EVENING. 90 tablet 1  . fluticasone (FLONASE) 50 MCG/ACT nasal spray PLACE 1 SPRAY INTO BOTH NOSTRILS EVERY MORNING. 16 g 3  . guaiFENesin (MUCINEX) 600 MG 12 hr tablet Take 1,200 mg by mouth 2 (two) times daily as needed for cough or to loosen phlegm.     . hydrALAZINE (APRESOLINE) 10 MG tablet TAKE 1 TABLET BY MOUTH 3 TIMES A DAY 270 tablet 1  . KLOR-CON M20 20 MEQ tablet TAKE 2 TABLETS (40 MEQ TOTAL) BY MOUTH DAILY. EVERY EVENING  0  . nystatin ointment (MYCOSTATIN) APPLY TO AFFECTED AREA TWICE A DAY 30 g 0  . oxyCODONE (OXY IR/ROXICODONE) 5 MG immediate release tablet Take 1 tablet (5 mg total) 2 (two) times daily as needed by mouth for severe pain. 60 tablet 0  . polyethylene glycol (MIRALAX / GLYCOLAX) packet TAKE 17 G BY MOUTH DAILY. AS DIRECTED (Patient taking differently: TAKE 17 G BY MOUTH DAILY AS NEEDED FOR CONSTIPATION) 90 packet 1  . pravastatin (PRAVACHOL) 20 MG tablet Take 1 tablet (20 mg total) by mouth daily. 90 tablet 3  . sertraline (ZOLOFT) 25 MG tablet TAKE 1 TABLET BY MOUTH EVERYDAY AT BEDTIME (Patient taking differently: TAKE 1 TABLET BY MOUTH EVERYDAY AS NEEDED FOR DEPRESSION) 30 tablet 3  . tamsulosin (FLOMAX) 0.4 MG CAPS capsule TAKE 1 CAPSULE BY MOUTH DAILY AFTER SUPPER. 90 capsule 1  . triamcinolone cream (KENALOG) 0.1 % APPLY TO AFFECTED AREA TWICE A DAY (Patient taking differently: APPLY TO  AFFECTED AREA TWICE A DAY AS NEEDED FOR RASH) 30 g 0  . VENTOLIN HFA 108 (90 Base) MCG/ACT inhaler INHALE 2 PUFFS INTO THE LUNGS EVERY 6 HOURS AS NEEDED FOR WHEEZING. 54 Inhaler 1  . furosemide (LASIX) 40 MG tablet TAKE 1.5 TABLETS (60 MG TOTAL) BY MOUTH DAILY. PLEASE CONTACT OFFICE FOR ADDITIONAL REFILLS 45 tablet 0   No facility-administered medications prior to visit.      Allergies:   Doxycycline; Amlodipine; Fish allergy; Hydrocodone; Other; and Tylenol [acetaminophen]   Social History   Socioeconomic History  . Marital status: Widowed    Spouse name: None  . Number of children:  None  . Years of education: None  . Highest education level: None  Social Needs  . Financial resource strain: None  . Food insecurity - worry: None  . Food insecurity - inability: None  . Transportation needs - medical: None  . Transportation needs - non-medical: None  Occupational History  . None  Tobacco Use  . Smoking status: Former Research scientist (life sciences)  . Smokeless tobacco: Never Used  Substance and Sexual Activity  . Alcohol use: No    Alcohol/week: 0.0 oz    Comment: History of heavy alcohol use per pt. Quit many years ago1/1/ 2005  . Drug use: No  . Sexual activity: No  Other Topics Concern  . None  Social History Narrative   lives alone here in [place].  He has 1 son, who lives in the [place], and he has     an uncle, who lives nearby as well.      Family History:  The patient's family history includes Cirrhosis in his mother.   Review of Systems:   Please see the history of present illness.     General:  No chills, fever, night sweats or weight changes.  Cardiovascular:  No chest pain, dyspnea on exertion, orthopnea, palpitations, paroxysmal nocturnal dyspnea. Positive for dyspnea on exertion and lower extremity edema.  Dermatological: No rash, lesions/masses Respiratory: No cough, dyspnea Urologic: No hematuria, dysuria Abdominal:   No nausea, vomiting, diarrhea, bright red blood  per rectum, melena, or hematemesis Neurologic:  No visual changes, wkns, changes in mental status. All other systems reviewed and are otherwise negative except as noted above.   Physical Exam:    VS:  BP (!) 122/56   Pulse 65   Ht 6' (1.829 m)   Wt 173 lb (78.5 kg)   SpO2 97%   BMI 23.46 kg/m    General: Well developed, well nourished Caucasian male appearing in no acute distress. Head: Normocephalic, atraumatic, sclera non-icteric, no xanthomas, nares are without discharge.  Neck: No carotid bruits. JVD not elevated.  Lungs: Respirations regular and unlabored, without wheezes or rales.  Heart: Regular rate and rhythm with occasional ectopic beats. No S3 or S4.  No rubs or gallops appreciated. 2/6 SEM along RUSB.  Abdomen: Soft, non-tender, non-distended with normoactive bowel sounds. No hepatomegaly. No rebound/guarding. No obvious abdominal masses. Msk:  Strength and tone appear normal for age. No joint deformities or effusions. Extremities: No clubbing or cyanosis. 1+ pitting edema up to knees bilaterally.  Distal pedal pulses are 2+ bilaterally. Neuro: Alert and oriented X 3. Moves all extremities spontaneously. No focal deficits noted. Psych:  Responds to questions appropriately with a normal affect. Skin: No rashes or lesions noted  Wt Readings from Last 3 Encounters:  03/16/17 173 lb (78.5 kg)  02/27/17 176 lb (79.8 kg)  02/13/17 179 lb (81.2 kg)     Studies/Labs Reviewed:   EKG:  EKG is ordered today. The ekg ordered today demonstrates 2nd Degree Type 1 AV Block. HR 65. LVH with no acute ST or T-wave changes. (Reviewed with DOD - Dr. Martinique)  Recent Labs: 01/21/2017: B Natriuretic Peptide 63.4 01/26/2017: ALT 16; BUN 25; Creatinine, Ser 1.44; Hemoglobin 9.2; Platelets 184.0; Potassium 3.9; Sodium 141   Lipid Panel    Component Value Date/Time   CHOL 115 12/13/2015 1358   TRIG 117.0 12/13/2015 1358   HDL 33.00 (L) 12/13/2015 1358   CHOLHDL 3 12/13/2015 1358   VLDL  23.4 12/13/2015 1358   LDLCALC 59 12/13/2015 1358  Additional studies/ records that were reviewed today include:   Echocardiogram: 01/2016 Study Conclusions  - Left ventricle: The cavity size was normal. Wall thickness was   normal. Systolic function was vigorous. The estimated ejection   fraction was in the range of 65% to 70%. Doppler parameters are   consistent with abnormal left ventricular relaxation (grade 1   diastolic dysfunction). - Aortic valve: AV is difficult to see well Peak and mean gradients   though the valve are 20 and 11 mm Hg respectively consistent with   very mild AS Compared to previous echo gradients are relatively   unchanged - Left atrium: The atrium was mildly dilated. - Pulmonary arteries: PA peak pressure: 34 mm Hg (S).  AAA Ultrasound: 11/2016   Assessment:    1. Coronary artery disease involving native coronary artery of native heart without angina pectoris   2. Typical atrial flutter ([place])   3. Essential hypertension   4. Hyperlipidemia, unspecified hyperlipidemia type   5. Chronic diastolic heart failure (HCC)   6. Abdominal aortic aneurysm (AAA) without rupture ([place])   7. Bilateral carotid artery stenosis   8. Aortic valve stenosis, etiology of cardiac valve disease unspecified   9. CKD (chronic kidney disease) stage 3, GFR 30-59 ml/min (HCC)      Plan:   In order of problems listed above:  1. CAD - s/p CABG in 10/2008 with LIMA-LAD, SVG-OM1 with Y-graft to PL branch, and SVG-RCA.  - he denies any recent episodes of chest discomfort. Has dyspnea on exertion at baseline.  - continue ASA and statin therapy.   2. Paroxysmal Atrial Flutter - s/p ablation in 2016. EKG today was initially concerning for AF due to the irregularity of the QRS complex but p-waves are noted and most consistent with Wenckebach. Reviewed with Dr. Martinique who agreed - not on any AV nodal blocking agents at this time due to history of bradycardia.   3. HTN -   BP is well-controlled at 122/56 during today's visit.  - continue Hydralazine 10mg  TID.   4. HLD - recently switched from Atorvastatin to Pravastatin by his PCP due to concerns the statin therapy was contributing to his myalgias. He has not noticed improvement in his symptoms since these changes. Will recheck FLP and LFT's today. If LDL not at goal of < 70, would recommend switching back to a higher-intensity statin in the setting of his known CAD.   5. Chronic Diastolic CHF - echo in 99/3716 showed a preserved EF of 65-70% with Grade 1 DD.  - lungs are clear on examination but he does have significant lower extremity edema. Will increase Lasix from 60mg  daily to 40mg  BID. He has follow-up with his PCP next week and wishes to follow-up there due to transportation issues. Will need a repeat BMET at that time. Sodium and fluid restriction were reviewed as he consumes 6+ cans of soda daily. Also recommended the utilization of compression stockings.   6. AAA - ultrasound in 11/2016 showed a stable infrarenal AAA in the distal aorta measuring 3.0 x 3.2 cm.  - repeat ultrasound in 1 year.   7. Carotid Artery Stenosis - carotid dopplers in 11/2016 showed stable 1-39% RICA stenosis and 96-78% LICA stenosis  - repeat imaging in 1 year.   8. Aortic Stenosis - mild by echocardiogram in 01/2016  9. Stage 3 CKD - creatinine was stable at 1.44 on most recent check.    Medication Adjustments/Labs and Tests Ordered: Current medicines are reviewed at  length with the patient today.  Concerns regarding medicines are outlined above.  Medication changes, Labs and Tests ordered today are listed in the Patient Instructions below. Patient Instructions  Medication Instructions:  INCREASE Lasix to 40 mg take 1 tablet twice a day   Labwork: Your physician recommends that you return for lab work in: TODAY-LIPID, LFT  Testing/Procedures: NONE  Follow-Up: Your physician wants you to follow-up in: 68 MONTHS  with DR CRENSHAW. You will receive a reminder letter in the mail two months in advance. If you don't receive a letter, please call our office to schedule the follow-up appointment.  Any Other Special Instructions Will Be Listed Below (If Applicable).  If you need a refill on your cardiac medications before your next appointment, please call your pharmacy.    Signed, Erma Heritage, PA-C  03/16/2017 8:04 PM    [place], [place], [place]  [address] Phone: [phone]; Fax: [phone]  [address] [place], [place] [address] Phone: [phone]

## 2017-03-16 ENCOUNTER — Ambulatory Visit (INDEPENDENT_AMBULATORY_CARE_PROVIDER_SITE_OTHER): Payer: Medicare Other | Admitting: Student

## 2017-03-16 ENCOUNTER — Other Ambulatory Visit: Payer: Self-pay | Admitting: *Deleted

## 2017-03-16 ENCOUNTER — Encounter: Payer: Self-pay | Admitting: Student

## 2017-03-16 VITALS — BP 122/56 | HR 65 | Ht 72.0 in | Wt 173.0 lb

## 2017-03-16 DIAGNOSIS — I5032 Chronic diastolic (congestive) heart failure: Secondary | ICD-10-CM

## 2017-03-16 DIAGNOSIS — I10 Essential (primary) hypertension: Secondary | ICD-10-CM

## 2017-03-16 DIAGNOSIS — I483 Typical atrial flutter: Secondary | ICD-10-CM | POA: Diagnosis not present

## 2017-03-16 DIAGNOSIS — I6523 Occlusion and stenosis of bilateral carotid arteries: Secondary | ICD-10-CM | POA: Diagnosis not present

## 2017-03-16 DIAGNOSIS — I350 Nonrheumatic aortic (valve) stenosis: Secondary | ICD-10-CM

## 2017-03-16 DIAGNOSIS — N183 Chronic kidney disease, stage 3 unspecified: Secondary | ICD-10-CM

## 2017-03-16 DIAGNOSIS — I714 Abdominal aortic aneurysm, without rupture, unspecified: Secondary | ICD-10-CM

## 2017-03-16 DIAGNOSIS — I25119 Atherosclerotic heart disease of native coronary artery with unspecified angina pectoris: Secondary | ICD-10-CM | POA: Diagnosis not present

## 2017-03-16 DIAGNOSIS — E785 Hyperlipidemia, unspecified: Secondary | ICD-10-CM | POA: Diagnosis not present

## 2017-03-16 DIAGNOSIS — I2510 Atherosclerotic heart disease of native coronary artery without angina pectoris: Secondary | ICD-10-CM

## 2017-03-16 MED ORDER — FUROSEMIDE 40 MG PO TABS: 40.0000 mg | ORAL_TABLET | Freq: Two times a day (BID) | ORAL | 3 refills | Status: DC

## 2017-03-16 NOTE — Patient Instructions (Addendum)
Medication Instructions:  INCREASE Lasix to 40 mg take 1 tablet twice a day   Labwork: Your physician recommends that you return for lab work in: TODAY-LIPID, LFT  Testing/Procedures: NONE  Follow-Up: Your physician wants you to follow-up in: 53 MONTHS with DR CRENSHAW. You will receive a reminder letter in the mail two months in advance. If you don't receive a letter, please call our office to schedule the follow-up appointment.  Any Other Special Instructions Will Be Listed Below (If Applicable).  If you need a refill on your cardiac medications before your next appointment, please call your pharmacy.

## 2017-03-17 ENCOUNTER — Ambulatory Visit (INDEPENDENT_AMBULATORY_CARE_PROVIDER_SITE_OTHER): Payer: Medicare Other | Admitting: Internal Medicine

## 2017-03-17 ENCOUNTER — Encounter: Payer: Self-pay | Admitting: Internal Medicine

## 2017-03-17 DIAGNOSIS — I6523 Occlusion and stenosis of bilateral carotid arteries: Secondary | ICD-10-CM

## 2017-03-17 DIAGNOSIS — G8929 Other chronic pain: Secondary | ICD-10-CM

## 2017-03-17 DIAGNOSIS — M549 Dorsalgia, unspecified: Secondary | ICD-10-CM | POA: Diagnosis not present

## 2017-03-17 LAB — LIPID PANEL
Chol/HDL Ratio: 3 ratio (ref 0.0–5.0)
Cholesterol, Total: 125 mg/dL (ref 100–199)
HDL: 42 mg/dL (ref >39)
LDL Calculated: 64 mg/dL (ref 0–99)
Triglycerides: 96 mg/dL (ref 0–149)
VLDL Cholesterol Cal: 19 mg/dL (ref 5–40)

## 2017-03-17 LAB — HEPATIC FUNCTION PANEL
ALT: 15 IU/L (ref 0–44)
AST: 26 IU/L (ref 0–40)
Albumin: 3.9 g/dL (ref 3.5–4.8)
Alkaline Phosphatase: 95 IU/L (ref 39–117)
Bilirubin Total: 0.5 mg/dL (ref 0.0–1.2)
Bilirubin, Direct: 0.2 mg/dL (ref 0.00–0.40)
Total Protein: 6.4 g/dL (ref 6.0–8.5)

## 2017-03-17 MED ORDER — OXYCODONE HCL 20 MG PO TABS: 20.0000 mg | ORAL_TABLET | ORAL | 0 refills | Status: DC

## 2017-03-17 NOTE — Telephone Encounter (Signed)
Would recommend for him to pick up or wait. I'm not sure that we can over ride this with the new laws.

## 2017-03-17 NOTE — Patient Instructions (Signed)
We have sent in the oxycodone 20 mg to take 7 or 8 pills per day to help with the pain.

## 2017-03-17 NOTE — Telephone Encounter (Signed)
Please help with controlled medication. Thanks.

## 2017-03-17 NOTE — Telephone Encounter (Signed)
Copied from [place] [id number]. Topic: Quick Communication - See Telephone Encounter >> Mar 17, 2017  1:05 PM Vernona Rieger wrote: CRM for notification. See Telephone encounter for:  Pt just left the office from seeing Dr Sharlet Salina, he went to the pharmacy to pick it up & it was 220 & the insurance will only pay for 180. Or Dr Sharlet Salina could over ride it and might take 2 days. [facility] [address]. [phone]  03/17/17.

## 2017-03-17 NOTE — Telephone Encounter (Signed)
The medication is oxycodone.

## 2017-03-17 NOTE — Telephone Encounter (Signed)
[name] can you please follow up with patient.

## 2017-03-18 NOTE — Telephone Encounter (Signed)
Contacted patient and informed him that he can get the 180 covered and pay for the 40 remaining pills. was told that it was upwards of 200 dollars. Was not sure if that was for the entire script or the forty. He already picked up the 180 because he needed it and will call back to see how much the 40 pills will cost. States he understands with the new laws that he may not be able to get the 220 pills for a month covered. I am also re-faxing the Rx for the [facility]

## 2017-03-20 NOTE — Telephone Encounter (Signed)
Patient contacted to get a hold of insurance company to see if there is any form that can be filled out to get the full RX covered. Stated that this would be the best way for him to get what he wants as two prescriptions for the Rx is probably not ideal.

## 2017-03-20 NOTE — Telephone Encounter (Signed)
Called pharmacy and they stated that they cannot split narcotics unless a script is given for the 40 extra pills for him to pay out of pocket. Please advise

## 2017-03-20 NOTE — Telephone Encounter (Signed)
Relation to KP:QAES Call back number:[phone] Pharmacy: pharmacy [address], [place]. [phone] (Phone) [phone] (Fax)      Reason for call:  Patient contacted pharmacy and pharmacy advised patient would come out of pocket for the 40 tablets, patient voice understanding requesting RX reflecting 40, patient would like a follow up call verfiying when Rx is sent due to making transportation arraingments, please advise

## 2017-03-20 NOTE — Assessment & Plan Note (Signed)
Will change from fentanyl back to oxycodone 20 mg every 4 hours as needed. Refilled today for #220 which is his monthly allotment.

## 2017-03-20 NOTE — Progress Notes (Signed)
   Subjective:    Patient ID: [name], male    DOB: 1941-04-07, 76 y.o.   MRN: [id number]  HPI The patient is a 76 YO man coming in for change in his pain medication (previously on oxycodone alone which was giving suboptimal control of pain, switched to fentanyl and smaller dose breakthrough oxycodone which has also not controlled his pain, he feels fentanyl is only lasting about 1-2 days instead of the 3 it is supposed to last). Is having bad pain 8-10/10 all day long once the fentanyl wears off and then he will take the oxycodone 5 mg which does not do much.   Review of Systems  Constitutional: Negative.   Respiratory: Negative for cough, chest tightness and shortness of breath.   Cardiovascular: Negative for chest pain, palpitations and leg swelling.  Gastrointestinal: Negative for abdominal distention, abdominal pain, constipation, diarrhea, nausea and vomiting.  Musculoskeletal: Positive for arthralgias, back pain, gait problem and myalgias.  Skin: Negative.   Psychiatric/Behavioral: Negative.       Objective:   Physical Exam  Constitutional: He is oriented to person, place, and time. He appears well-developed and well-nourished.  HENT:  Head: Normocephalic and atraumatic.  Eyes: EOM are normal.  Neck: Normal range of motion.  Cardiovascular: Normal rate and regular rhythm.  Pulmonary/Chest: Effort normal and breath sounds normal. No respiratory distress. He has no wheezes. He has no rales.  Abdominal: Soft. Bowel sounds are normal. He exhibits no distension. There is no tenderness. There is no rebound.  Musculoskeletal: He exhibits tenderness. He exhibits no edema.  Neurological: He is alert and oriented to person, place, and time. Coordination abnormal.  Skin: Skin is warm and dry.   Vitals:   03/17/17 1120  BP: 124/60  Pulse: (!) 57  Temp: 97.9 F (36.6 C)  TempSrc: Oral  SpO2: 100%  Weight: 178 lb (80.7 kg)  Height: 6' (1.829 m)      Assessment & Plan:

## 2017-03-20 NOTE — Telephone Encounter (Signed)
I cannot give further advice. He can call insurance to see if they can give exception. He has already filled 180.

## 2017-03-23 ENCOUNTER — Telehealth: Payer: Self-pay | Admitting: Internal Medicine

## 2017-03-23 NOTE — Telephone Encounter (Addendum)
Patient calling requesting call back. [facility]  States that [facility] will [facility] have [facility].   [employer] states that they need a PA for 220 (statses that heard someone else in background states they script could be written for 30mg  for 180 w/out PA could be done) - can be reached at [phone] [employer] ID:  [id number]

## 2017-03-23 NOTE — Telephone Encounter (Signed)
Copied from [place] [id number]. Topic: Quick Communication - See Telephone Encounter >> Mar 17, 2017  1:05 PM Vernona Rieger wrote: CRM for notification. See Telephone encounter for:  Pt just left the office from seeing Dr Sharlet Salina, he went to the pharmacy to pick it up & it was 220 & the insurance will only pay for 180. Or Dr Sharlet Salina could over ride it and might take 2 days. [facility] [address] [phone]  03/17/17. >> Mar 19, 2017  1:48 PM Robina Ade, Helene Kelp D wrote: Patient called and would like to talk to [facility] about his medication oxycodone. He said that the pharmacy said that if provider would send a rx for the remaining of the 40 tabs to his pharmacy. Also they told him that when this medication is sent to please send 2 different rx one for 180 tabs and the second for 40 tabs. Please call patient back, thanks. >> Mar 23, 2017  9:44 AM Chauncey Mann A wrote: Pt. Returning call to speak to [facility] regarding oxycodone.   Please return pt call.

## 2017-03-24 NOTE — Telephone Encounter (Signed)
Patient called insuance and stated that he could get a PA done for the 220 pill count and  provided me with a number to call and talk to someone. Also states that he heard someone in the background say possibly 30mg  for 180 dispensed could be covered?  Patient is also asking if there is any way that an RX for 40 pills can be sent into his pharmacy so he can buy them out of pocket to get his full 220 count. I stated that this may not be able to happen, that even if [facility] states it is allowed that the new rules may not allow it. He is really hoping you will allow it this time.  Do you want me to try a PA for the 220 pill count 20mg ?

## 2017-03-24 NOTE — Telephone Encounter (Signed)
Informed patient I will start PA tomorrow and that the Rx will not be filled for the 40 oxycodone

## 2017-03-24 NOTE — Telephone Encounter (Signed)
Fine to try PA

## 2017-03-25 ENCOUNTER — Other Ambulatory Visit: Payer: Self-pay | Admitting: Internal Medicine

## 2017-03-25 NOTE — Telephone Encounter (Signed)
DID a PA for Oxycodone 220dispensed and is approved 04/19/2017-04/20/2018

## 2017-03-25 NOTE — Telephone Encounter (Signed)
Spoke with patient and let him know about the approval

## 2017-03-26 NOTE — Telephone Encounter (Signed)
noted 

## 2017-03-28 ENCOUNTER — Other Ambulatory Visit: Payer: Self-pay | Admitting: Internal Medicine

## 2017-03-28 ENCOUNTER — Other Ambulatory Visit: Payer: Self-pay | Admitting: Cardiology

## 2017-03-28 ENCOUNTER — Other Ambulatory Visit: Payer: Self-pay | Admitting: Family

## 2017-04-07 ENCOUNTER — Other Ambulatory Visit: Payer: Self-pay | Admitting: Cardiology

## 2017-04-07 ENCOUNTER — Telehealth: Payer: Self-pay

## 2017-04-07 NOTE — Telephone Encounter (Signed)
Cannot be filled, too early.

## 2017-04-07 NOTE — Telephone Encounter (Signed)
Copied from [place] [phone]. Topic: General - Other >> Apr 07, 2017  9:04 AM [name], RMA wrote: Reason for CRM: Pt would like a call back to discuss medication oxycodone 20mg  tablet

## 2017-04-07 NOTE — Telephone Encounter (Signed)
States that he is ready for his Refill of oxycodone

## 2017-04-08 NOTE — Telephone Encounter (Signed)
Patient informed that we can't refill till the 21'st

## 2017-04-10 ENCOUNTER — Telehealth: Payer: Self-pay | Admitting: Internal Medicine

## 2017-04-10 MED ORDER — OXYCODONE HCL 20 MG PO TABS: 20.0000 mg | ORAL_TABLET | ORAL | 0 refills | Status: DC

## 2017-04-10 NOTE — Telephone Encounter (Signed)
Patient informed. 

## 2017-04-10 NOTE — Telephone Encounter (Signed)
Patient calling for refill to be sent in

## 2017-04-10 NOTE — Telephone Encounter (Signed)
Copied from [place] [phone]. Topic: Inquiry >> Apr 10, 2017  1:54 PM [name] wrote: Reason for CRM: Patient is requesting a call back from [place], it's in refer to his oxycodone 20mg  tablet. He trying to see if the Rx has been called in to his pharmacy.

## 2017-04-10 NOTE — Telephone Encounter (Signed)
Sent in

## 2017-04-10 NOTE — Telephone Encounter (Signed)
Copied from [place] [phone]. Topic: General - Other >> Apr 07, 2017  9:04 AM Lolita Rieger, RMA wrote: Reason for CRM: Pt would like a call back to discuss medication oxycodone 20mg  tablet. Pt also would like a call back when the refill has been called in.   Please see phone note from 12/18, was not able to add to that note.  [id number] [address], [place]. [phone] (Phone) [phone] (Fax)

## 2017-04-20 ENCOUNTER — Ambulatory Visit: Payer: Self-pay

## 2017-04-20 NOTE — Telephone Encounter (Signed)
Pt called to report that both legs are having severe lower leg edema. Pt states he is having blisters that weep fluid. Pt states that he "busts the blister" and is applying triple antibiotic  Reason for Disposition . SEVERE leg swelling (e.g., swelling extends above knee, entire leg is swollen, weeping fluid)  Answer Assessment - Initial Assessment Questions 1. ONSET: "When did the swelling start?" (e.g., minutes, hours, days)     Since 2010 after heart surgery has had edema to legs. For 2 days has clear fluid leaking at front of ankle as a blister. Pt busted the blister and is putting Neosporin on it. 2. LOCATION: "What part of the leg is swollen?"  "Are both legs swollen or just one leg?"     Knee to ankle , both legs 3. SEVERITY: "How bad is the swelling?" (e.g., localized; mild, moderate, severe)  - Localized - small area of swelling localized to one leg  - MILD pedal edema - swelling limited to foot and ankle, pitting edema < 1/4 inch (6 mm) deep, rest and elevation eliminate most or all swelling  - MODERATE edema - swelling of lower leg to knee, pitting edema > 1/4 inch (6 mm) deep, rest and elevation only partially reduce swelling  - SEVERE edema - swelling extends above knee, facial or hand swelling present      Pt states is non pitting edema 4. REDNESS: "Does the swelling look red or infected?"    No  5. PAIN: "Is the swelling painful to touch?" If so, ask: "How painful is it?"   (Scale 1-10; mild, moderate or severe)     Yes 3-4/10 6. FEVER: "Do you have a fever?" If so, ask: "What is it, how was it measured, and when did it start?"      no 7. CAUSE: "What do you think is causing the leg swelling?"     Pt states he doesn't know 8. MEDICAL HISTORY: "Do you have a history of heart failure, kidney disease, liver failure, or cancer?"     no 9. RECURRENT SYMPTOM: "Have you had leg swelling before?" If so, ask: "When was the last time?" "What happened that time?"     Yes intermittently  but tx with Lasix and edema would improve. Now not improving as he thinks it should 10. OTHER SYMPTOMS: "Do you have any other symptoms?" (e.g., chest pain, difficulty breathing)     COPD 11. PREGNANCY: "Is there any chance you are pregnant?" "When was your last menstrual period?"       n/a  Protocols used: LEG SWELLING AND EDEMA-A-AH

## 2017-04-20 NOTE — Telephone Encounter (Signed)
Pt states he takes Lasix twice per day. Pt edema is non pitting but is weeping fluid. Dispostion to nearest [facility].

## 2017-04-23 ENCOUNTER — Ambulatory Visit: Payer: Medicare Other | Admitting: Family Medicine

## 2017-04-24 ENCOUNTER — Ambulatory Visit (INDEPENDENT_AMBULATORY_CARE_PROVIDER_SITE_OTHER): Payer: Medicare Other | Admitting: Internal Medicine

## 2017-04-24 ENCOUNTER — Encounter: Payer: Self-pay | Admitting: Internal Medicine

## 2017-04-24 VITALS — BP 140/60 | HR 67 | Temp 97.9°F | Ht 72.0 in | Wt 183.0 lb

## 2017-04-24 DIAGNOSIS — I5032 Chronic diastolic (congestive) heart failure: Secondary | ICD-10-CM

## 2017-04-24 DIAGNOSIS — I872 Venous insufficiency (chronic) (peripheral): Secondary | ICD-10-CM

## 2017-04-24 MED ORDER — FUROSEMIDE 40 MG PO TABS: ORAL_TABLET | ORAL | 6 refills | Status: DC

## 2017-04-24 NOTE — Progress Notes (Signed)
   Subjective:    Patient ID: [name], male    DOB: [birth year], 77 y.o.   MRN: [id number]  HPI The patient is a 77 YO man coming in for leg swelling. They do swell chronically. He does not use compression stockings for them. He is taking his lasix as he should 1 pill twice a day (40 mg pill). Denies missing doses. He does not have low sodium diet and eats canned and pre-prepared foods often. He does not drink much water or fluids as he is concerned about this worsening his swelling. Does have some blistering of the skin from swelling and some open sores which he is using antibiotic ointment over. He denies significant pain in the area.   Review of Systems  Constitutional: Negative.   Respiratory: Negative.   Cardiovascular: Positive for leg swelling. Negative for chest pain and palpitations.  Gastrointestinal: Negative.   Musculoskeletal: Positive for arthralgias, back pain and gait problem.  Skin: Negative.   Neurological: Negative for dizziness, tremors, syncope, facial asymmetry, speech difficulty and weakness.      Objective:   Physical Exam  Constitutional: He is oriented to person, place, and time. He appears well-developed and well-nourished.  HENT:  Head: Normocephalic and atraumatic.  Eyes: EOM are normal.  Neck: Normal range of motion.  Cardiovascular: Normal rate and regular rhythm.  Pulmonary/Chest: Effort normal and breath sounds normal. No respiratory distress. He has no wheezes. He has no rales.  Abdominal: Soft.  Musculoskeletal: He exhibits edema.  Woody pitting edema to the knees bilaterally, 1-2+. There are several open blisters on the right leg which do not appear infection. No cellulitis. Some color change of the skin of the lower legs bilaterally.  Neurological: He is alert and oriented to person, place, and time. Coordination abnormal.  Gait is unsteady  Skin: Skin is warm and dry.  Psychiatric: He has a normal mood and affect.   Vitals:   04/24/17  1112  BP: 140/60  Pulse: 67  Temp: 97.9 F (36.6 C)  TempSrc: Oral  SpO2: 100%  Weight: 183 lb (83 kg)  Height: 6' (1.829 m)      Assessment & Plan:

## 2017-04-24 NOTE — Assessment & Plan Note (Signed)
Rx for compression stockings. Given the cirrhosis and heart failure and the woodiness of the edema on exam I do not feel that we can get rid of this. We will increase lasix to 2 pills q am and 1 pill q pm. New rx sent in. Talked to him about low salt diet and the canned foods high sodium levels. He is encouraged to drink some water daily to help with swelling. Will monitor the open wounds for infection. There are no signs of that today.

## 2017-04-24 NOTE — Patient Instructions (Signed)
Take the lasix 2 pills in the morning and 1 pill in the afternoon.   We have given you the prescription for the compression stockings.   Try to reduce the salt in the diet to help with the fluid.

## 2017-04-28 ENCOUNTER — Encounter: Payer: Self-pay | Admitting: Internal Medicine

## 2017-04-30 ENCOUNTER — Telehealth: Payer: Self-pay | Admitting: Internal Medicine

## 2017-04-30 ENCOUNTER — Encounter: Payer: Self-pay | Admitting: Internal Medicine

## 2017-04-30 MED ORDER — LIDOCAINE 5 % EX PTCH: 1.0000 | MEDICATED_PATCH | CUTANEOUS | 0 refills | Status: DC

## 2017-04-30 NOTE — Telephone Encounter (Unsigned)
Copied from [place] [phone]. Topic: Quick Communication - See Telephone Encounter >> Apr 30, 2017  2:42 PM Boyd Kerbs wrote: CRM for notification. See Telephone encounter for:   Pt. [facility] for leg Dr. Sharlet Salina called in for him.  He is in pain and possible   [id number] [address], [place]. [place] [address] Phone: [phone] Fax: [phone]    04/30/17.

## 2017-04-30 NOTE — Telephone Encounter (Signed)
Forward nsg to MD assistant pt need PA for the Lidoderm patches.Marland KitchenJohny Mcdowell

## 2017-05-01 NOTE — Telephone Encounter (Signed)
PA started on CoverMyMeds [id number]

## 2017-05-06 ENCOUNTER — Telehealth: Payer: Self-pay | Admitting: Internal Medicine

## 2017-05-06 ENCOUNTER — Ambulatory Visit: Payer: Medicare Other | Admitting: Cardiology

## 2017-05-06 NOTE — Telephone Encounter (Signed)
Copied from [place] [phone]. Topic: Quick Communication - Rx Refill/Question >> May 06, 2017 12:31 PM Lolita Rieger, [place] wrote: Medication: oxycodone   Has the patient contacted their pharmacy? yes   (Agent: If no, request that the patient contact the pharmacy for the refill.)   Preferred Pharm[facility] (with phone number [facility] street name[facility] [address]   Agent: Please be advised that RX refills may take up to 3 business days. We ask that you follow-up with your pharmacy.

## 2017-05-08 ENCOUNTER — Other Ambulatory Visit: Payer: Self-pay

## 2017-05-08 MED ORDER — OXYCODONE HCL 20 MG PO TABS: 20.0000 mg | ORAL_TABLET | ORAL | 0 refills | Status: DC

## 2017-05-08 NOTE — Telephone Encounter (Signed)
Additional message: Patient called saying [facility] [address]   Needs before Monday.. There was 31 days in month and gets 30 day supply.

## 2017-05-08 NOTE — Telephone Encounter (Signed)
Handled via refill request

## 2017-05-08 NOTE — Telephone Encounter (Signed)
Sent PCP the Refill Request

## 2017-05-10 ENCOUNTER — Other Ambulatory Visit: Payer: Self-pay | Admitting: Family

## 2017-05-15 ENCOUNTER — Telehealth: Payer: Self-pay | Admitting: Internal Medicine

## 2017-05-15 NOTE — Telephone Encounter (Signed)
He can use his albuterol inhaler that he has. Has his weight been increasing or more fluid lately? Sometimes fluid can cause breathing problems. Recommend visit [facility] to evaluate or ER if he worsens.

## 2017-05-15 NOTE — Telephone Encounter (Signed)
Called patient and he states that when he lays down it is hard to breathe has tried mucinex. Wants to know if anything can be sent in that is not ABX or if there is anything he can pick up at the pharmacy OTC. Told him he may need a visit, but wanted me to send a message anyway.

## 2017-05-15 NOTE — Telephone Encounter (Signed)
Informed patient of MD response states that if he can get a ride set up he will go to [facility] or ED if not will try to get in to our office Monday.

## 2017-05-15 NOTE — Telephone Encounter (Signed)
Copied from [place] [phone]. Topic: General - Other >> May 15, 2017  1:49 PM Darl Householder, RMA wrote: Reason for CRM: Patient is requesting a call back concerning a prescription to be called in to pharmacy for congestion , please return pt call

## 2017-05-16 ENCOUNTER — Ambulatory Visit (INDEPENDENT_AMBULATORY_CARE_PROVIDER_SITE_OTHER): Payer: Medicare Other | Admitting: Family Medicine

## 2017-05-16 ENCOUNTER — Encounter: Payer: Self-pay | Admitting: Family Medicine

## 2017-05-16 DIAGNOSIS — I5032 Chronic diastolic (congestive) heart failure: Secondary | ICD-10-CM

## 2017-05-16 NOTE — Assessment & Plan Note (Signed)
Likely mild fluid overload but lungs clear and doesn't appear in distress or need ER eval at this point.  D/wpt about options.  Take 2 lasix today when you get home and then 2 again 6 hours later. If better tomorrow, then okay to go back to 2 in the AM and 1 in the PM. If not better tomorrow but not worse, then okay to take 2 tabs twice a day, like today.  Call for an appointment on Monday.  If worse then go to the ER.  He agrees with plan. Okay for outpatient f/u.   Routed to PCP re: f/u and attn to f/u labs/weight with higher dose of lasix.

## 2017-05-16 NOTE — Patient Instructions (Signed)
Take 2 lasix today when you get home and then 2 again 6 hours later. If better tomorrow, then okay to go back to 2 in the AM and 1 in the PM. If not better tomorrow but not worse, then okay to take 2 tabs twice a day, like today.  Call for an appointment on Monday.  If worse then go to the ER.  Take care.  Glad to see you.

## 2017-05-16 NOTE — Progress Notes (Signed)
H/o CHF/COPD at baseline. On lasix and K at baseline. He can sit up without troubles but more SOB when supine. Normally sleeps on 1 pillow, more recently on 2 pillows. Legs have been more puffy recently.  Weight is still stable compared to earlier in the month.    Has been on lasix long term. He is trying to avoid salt in diet. "I don't have a salt shaker."  He isn't SOB at the OV, at time of exam. He uses walker at baseline when needed. He didn't get more SOB above his baseline coming into the clinic.    He has response to higher dose of lasix in the AM with inc UOP.    Meds, vitals, and allergies reviewed.   ROS: Per HPI unless specifically indicated in ROS section   GEN: nad, alert and oriented HEENT: mucous membranes moist NECK: supple w/o LA CV: rrr with occ ectopy noted but not irregular.   PULM: ctab, no inc wob ABD: soft, +bs EXT: 1-2 BLE edema SKIN: no acute rash but some chronic brawny changes noted on the BLE

## 2017-05-17 ENCOUNTER — Other Ambulatory Visit: Payer: Self-pay | Admitting: Family

## 2017-05-18 ENCOUNTER — Other Ambulatory Visit (INDEPENDENT_AMBULATORY_CARE_PROVIDER_SITE_OTHER): Payer: Medicare Other

## 2017-05-18 ENCOUNTER — Encounter: Payer: Self-pay | Admitting: Internal Medicine

## 2017-05-18 ENCOUNTER — Ambulatory Visit (INDEPENDENT_AMBULATORY_CARE_PROVIDER_SITE_OTHER): Payer: Medicare Other | Admitting: Internal Medicine

## 2017-05-18 VITALS — BP 110/60 | HR 75 | Temp 97.7°F | Ht 72.0 in | Wt 179.0 lb

## 2017-05-18 DIAGNOSIS — N179 Acute kidney failure, unspecified: Secondary | ICD-10-CM

## 2017-05-18 DIAGNOSIS — R0602 Shortness of breath: Secondary | ICD-10-CM

## 2017-05-18 DIAGNOSIS — I5189 Other ill-defined heart diseases: Secondary | ICD-10-CM

## 2017-05-18 DIAGNOSIS — K7460 Unspecified cirrhosis of liver: Secondary | ICD-10-CM | POA: Diagnosis not present

## 2017-05-18 DIAGNOSIS — J42 Unspecified chronic bronchitis: Secondary | ICD-10-CM

## 2017-05-18 DIAGNOSIS — N183 Chronic kidney disease, stage 3 (moderate): Secondary | ICD-10-CM

## 2017-05-18 DIAGNOSIS — I519 Heart disease, unspecified: Secondary | ICD-10-CM | POA: Diagnosis not present

## 2017-05-18 DIAGNOSIS — I872 Venous insufficiency (chronic) (peripheral): Secondary | ICD-10-CM | POA: Diagnosis not present

## 2017-05-18 LAB — CBC
HCT: 28.8 % — ABNORMAL LOW (ref 39.0–52.0)
Hemoglobin: 9.4 g/dL — ABNORMAL LOW (ref 13.0–17.0)
MCHC: 32.7 g/dL (ref 30.0–36.0)
MCV: 81 fl (ref 78.0–100.0)
Platelets: 176 10*3/uL (ref 150.0–400.0)
RBC: 3.55 Mil/uL — ABNORMAL LOW (ref 4.22–5.81)
RDW: 15.6 % — ABNORMAL HIGH (ref 11.5–15.5)
WBC: 5.2 10*3/uL (ref 4.0–10.5)

## 2017-05-18 LAB — COMPREHENSIVE METABOLIC PANEL
ALT: 13 U/L (ref 0–53)
AST: 25 U/L (ref 0–37)
Albumin: 3.9 g/dL (ref 3.5–5.2)
Alkaline Phosphatase: 96 U/L (ref 39–117)
BUN: 43 mg/dL — ABNORMAL HIGH (ref 6–23)
CO2: 29 mEq/L (ref 19–32)
Calcium: 8.9 mg/dL (ref 8.4–10.5)
Chloride: 104 mEq/L (ref 96–112)
Creatinine, Ser: 1.85 mg/dL — ABNORMAL HIGH (ref 0.40–1.50)
GFR: 38.12 mL/min — ABNORMAL LOW (ref >60.00)
Glucose, Bld: 108 mg/dL — ABNORMAL HIGH (ref 70–99)
Potassium: 3.7 mEq/L (ref 3.5–5.1)
Sodium: 144 mEq/L (ref 135–145)
Total Bilirubin: 0.7 mg/dL (ref 0.2–1.2)
Total Protein: 7.6 g/dL (ref 6.0–8.3)

## 2017-05-18 LAB — BRAIN NATRIURETIC PEPTIDE: Pro B Natriuretic peptide (BNP): 622 pg/mL — ABNORMAL HIGH (ref 0.0–100.0)

## 2017-05-18 MED ORDER — SPIRONOLACTONE 25 MG PO TABS: 25.0000 mg | ORAL_TABLET | Freq: Every day | ORAL | 3 refills | Status: DC

## 2017-05-18 NOTE — Patient Instructions (Signed)
We have sent in the new fluid pill called spironolactone. Take 1 pill daily in the morning with the 2 lasix pills in the morning.   We will call and let you know about the potassium.

## 2017-05-18 NOTE — Progress Notes (Signed)
   Subjective:    Patient ID: [name], male    DOB: [birth year], 77 y.o.   MRN: [id number]  HPI The patient is a 77 YO man coming in for 2 day follow up of swelling and SOB. He was seen at [facility] and they doubled his lasix dosing to 2 pills BID. He is taking this and peeing more often. Is not sure if the swelling is better. SOB is some better but he has severe COPD and often has SOB. He is not sure what is related to fluid. He does have leg swelling in the right and left (left worse than right) and this is stable. He is using antibiotic ointment on the right leg where he has a wound. He denies drainage, fevers or chills. Denies using much salt but does eat a lot of pre-prepared foods.   Review of Systems  Constitutional: Negative.   HENT: Negative.   Eyes: Negative.   Respiratory: Positive for cough and shortness of breath. Negative for chest tightness.   Cardiovascular: Positive for leg swelling. Negative for chest pain and palpitations.  Gastrointestinal: Negative for abdominal distention, abdominal pain, constipation, diarrhea, nausea and vomiting.  Musculoskeletal: Positive for arthralgias, back pain and gait problem.  Skin: Positive for wound.  Neurological: Negative for dizziness, tremors, syncope, numbness and headaches.  Psychiatric/Behavioral: Negative.       Objective:   Physical Exam  Constitutional: He is oriented to person, place, and time. He appears well-developed and well-nourished.  HENT:  Head: Normocephalic and atraumatic.  Eyes: EOM are normal.  Neck: Normal range of motion.  Cardiovascular: Normal rate and regular rhythm.  Pulmonary/Chest: Effort normal and breath sounds normal. No respiratory distress. He has no wheezes. He has no rales.  No clear rales, lung exam stale from prior  Abdominal: Soft. Bowel sounds are normal. He exhibits no distension. There is no tenderness. There is no rebound.  Musculoskeletal: He exhibits edema.  Right leg  improved from prior, left leg still with swelling and tightness, wound on the right shin which does not appear infected.   Neurological: He is alert and oriented to person, place, and time. Coordination normal.  Skin: Skin is warm and dry.  Psychiatric: He has a normal mood and affect.   Vitals:   05/18/17 1607  BP: 110/60  Pulse: 75  Temp: 97.7 F (36.5 C)  TempSrc: Oral  SpO2: 98%  Weight: 179 lb (81.2 kg)  Height: 6' (1.829 m)      Assessment & Plan:

## 2017-05-19 ENCOUNTER — Encounter: Payer: Self-pay | Admitting: Internal Medicine

## 2017-05-19 NOTE — Assessment & Plan Note (Signed)
Needs CMP today to check for change in renal function given the change in diuretic over the weekend.

## 2017-05-19 NOTE — Assessment & Plan Note (Signed)
Will add spironolactone to boost lasix for some swelling in his stomach which could be causing some restriction of his lungs and worsening his SOB.

## 2017-05-19 NOTE — Assessment & Plan Note (Signed)
No flare today.  

## 2017-05-19 NOTE — Assessment & Plan Note (Signed)
Suspect there is a mixed etiology to his SOB. Will have him do lasix 2 pills in the morning and add spironolactone 25 mg qam as well to boost fluid excretion. Checking CMP and may need adjustment.

## 2017-05-20 ENCOUNTER — Encounter: Payer: Self-pay | Admitting: Internal Medicine

## 2017-05-20 ENCOUNTER — Telehealth: Payer: Self-pay | Admitting: Internal Medicine

## 2017-05-20 NOTE — Telephone Encounter (Signed)
Copied from [place] [phone]. Topic: Quick Communication - See Telephone Encounter >> May 20, 2017 10:49 AM Ahmed Prima L wrote: CRM for notification. See Telephone encounter for:   05/20/17.   Patient wants to know if Dr Sharlet Salina is going to approve spironolactone (ALDACTONE) 25 MG tablet. He said the pharmacy kicked it out back out bc it was a medication that would interfere with his potassium. Please advise. [phone]     [id number] [place] [place] - [address]

## 2017-05-20 NOTE — Telephone Encounter (Signed)
Patient called and wanted to see if the doctor or the nurse could give him a call back regarding his spironolactone (ALDACTONE) 25 MG tablet. [phone]

## 2017-05-21 NOTE — Telephone Encounter (Signed)
Called patient and pharmacy and informed them

## 2017-05-21 NOTE — Telephone Encounter (Signed)
This is okay to fill.

## 2017-06-01 ENCOUNTER — Other Ambulatory Visit: Payer: Self-pay | Admitting: Internal Medicine

## 2017-06-02 ENCOUNTER — Encounter: Payer: Self-pay | Admitting: Internal Medicine

## 2017-06-02 MED ORDER — NALOXEGOL OXALATE 12.5 MG PO TABS: 12.5000 mg | ORAL_TABLET | Freq: Every day | ORAL | 6 refills | Status: DC

## 2017-06-04 ENCOUNTER — Encounter: Payer: Self-pay | Admitting: Internal Medicine

## 2017-06-05 ENCOUNTER — Other Ambulatory Visit: Payer: Self-pay | Admitting: Internal Medicine

## 2017-06-05 ENCOUNTER — Telehealth: Payer: Self-pay | Admitting: Internal Medicine

## 2017-06-05 NOTE — Telephone Encounter (Signed)
Copied from [place]. Topic: Quick Communication - Rx Refill/Question >> Jun 05, 2017 12:37 PM Synthia Innocent wrote: Medication: Oxycodone HCl 20 MG TABS    Has the patient contacted their pharmacy? No, states he must call due to control substance, Due to refill on 06/09/17   (Agent: If no, request that the patient contact the pharmacy for the refill.)   Preferred Pharmacy (with phone number [facility] street name): [facility] [address]   Agent: Please be advised that RX refills may take up to 3 business days. We ask that you follow-up with your pharmacy.

## 2017-06-05 NOTE — Telephone Encounter (Signed)
oxycodone refill Last OV: 03/17/17 Last Refill:05/10/17 [facility] [address] [place], [place]

## 2017-06-07 ENCOUNTER — Other Ambulatory Visit: Payer: Self-pay | Admitting: Family

## 2017-06-08 MED ORDER — OXYCODONE HCL 20 MG PO TABS: 20.0000 mg | ORAL_TABLET | ORAL | 0 refills | Status: DC

## 2017-06-08 NOTE — Telephone Encounter (Signed)
Sent in

## 2017-06-08 NOTE — Telephone Encounter (Signed)
Notified pt rx has been sent to your local pharmacy.../lmb  

## 2017-06-11 ENCOUNTER — Other Ambulatory Visit: Payer: Self-pay | Admitting: Internal Medicine

## 2017-06-18 ENCOUNTER — Encounter: Payer: Self-pay | Admitting: Internal Medicine

## 2017-06-18 DIAGNOSIS — I10 Essential (primary) hypertension: Secondary | ICD-10-CM

## 2017-06-22 ENCOUNTER — Other Ambulatory Visit (INDEPENDENT_AMBULATORY_CARE_PROVIDER_SITE_OTHER): Payer: Medicare Other

## 2017-06-22 ENCOUNTER — Encounter: Payer: Self-pay | Admitting: Internal Medicine

## 2017-06-22 ENCOUNTER — Ambulatory Visit (INDEPENDENT_AMBULATORY_CARE_PROVIDER_SITE_OTHER): Payer: Medicare Other | Admitting: Internal Medicine

## 2017-06-22 ENCOUNTER — Ambulatory Visit (INDEPENDENT_AMBULATORY_CARE_PROVIDER_SITE_OTHER)
Admission: RE | Admit: 2017-06-22 | Discharge: 2017-06-22 | Disposition: A | Discharge status: Home / Self Care | Payer: Medicare Other | Source: Ambulatory Visit | Attending: Internal Medicine | Admitting: Internal Medicine

## 2017-06-22 VITALS — BP 110/70 | HR 67 | Temp 97.7°F | Ht 72.0 in | Wt 177.0 lb

## 2017-06-22 DIAGNOSIS — I10 Essential (primary) hypertension: Secondary | ICD-10-CM | POA: Diagnosis not present

## 2017-06-22 DIAGNOSIS — M160 Bilateral primary osteoarthritis of hip: Secondary | ICD-10-CM | POA: Diagnosis not present

## 2017-06-22 DIAGNOSIS — M25551 Pain in right hip: Secondary | ICD-10-CM | POA: Diagnosis not present

## 2017-06-22 LAB — BASIC METABOLIC PANEL
BUN: 45 mg/dL — ABNORMAL HIGH (ref 6–23)
CO2: 25 mEq/L (ref 19–32)
Calcium: 9.6 mg/dL (ref 8.4–10.5)
Chloride: 99 mEq/L (ref 96–112)
Creatinine, Ser: 1.98 mg/dL — ABNORMAL HIGH (ref 0.40–1.50)
GFR: 35.23 mL/min — ABNORMAL LOW (ref >60.00)
Glucose, Bld: 102 mg/dL — ABNORMAL HIGH (ref 70–99)
Potassium: 4.2 mEq/L (ref 3.5–5.1)
Sodium: 133 mEq/L — ABNORMAL LOW (ref 135–145)

## 2017-06-22 MED ORDER — METHYLPREDNISOLONE ACETATE 80 MG/ML IJ SUSP
80.0000 mg | Freq: Once | INTRAMUSCULAR | Status: AC
  Administered 2017-06-22: 80 mg via INTRAMUSCULAR

## 2017-06-22 NOTE — Progress Notes (Signed)
   Subjective:    Patient ID: [name], male    DOB: 09-Feb-1941, 77 y.o.   MRN: [id number]  HPI The patient is a 77 YO man coming in for worsening low back and hip pain. He does take oxycodone for the pain but it is not helping as much. Goes down into his right leg. He cannot sleep due to pain and cannot get comfortable during the day either. He has been trying to get in with a pain clinic but has not had any luck. He does not feel able to afford to see an orthopedic or neurosurgeon at this time. He denies numbness in his legs. Tried lidocaine and icy hot over the counter which do not provide much relief. Denies falls or overuse. Has had to change gait since this pain is worse in the last 3 weeks and is concerned about his leg turning in from change in gait.   Review of Systems  Constitutional: Positive for activity change. Negative for appetite change, fatigue, fever and unexpected weight change.  Respiratory: Negative.   Cardiovascular: Negative.   Musculoskeletal: Positive for arthralgias, back pain, gait problem and myalgias.  Skin: Negative.   Neurological: Negative for syncope, weakness and numbness.      Objective:   Physical Exam  Constitutional: He is oriented to person, place, and time. He appears well-developed and well-nourished.  HENT:  Head: Normocephalic and atraumatic.  Eyes: EOM are normal.  Neck: Normal range of motion.  Cardiovascular: Normal rate and regular rhythm.  Pulmonary/Chest: Effort normal and breath sounds normal. No respiratory distress. He has no wheezes. He has no rales.  Abdominal: Soft.  Musculoskeletal: He exhibits tenderness. He exhibits no edema.  Neurological: He is alert and oriented to person, place, and time. Coordination abnormal.  In wheelchair, pain at the lumbar midline and paraspinal, pain in the right groin and laterally today.  Skin: Skin is warm and dry.   Vitals:   06/22/17 1431  BP: 110/70  Pulse: 67  Temp: 97.7 F (36.5 C)   TempSrc: Oral  SpO2: 97%  Weight: 177 lb (80.3 kg)  Height: 6' (1.829 m)      Assessment & Plan:  Depo-medrol 80 mg IM given at visit.

## 2017-06-22 NOTE — Patient Instructions (Signed)
We have given you a steroid shot today for the pain. It may take 1-2 days to kick in.  We will check an x-ray of the hips today.

## 2017-06-22 NOTE — Assessment & Plan Note (Signed)
No fall recently, CT abdomen and pelvis with severe degenerative changes in 2015. Checking x-ray hips and pelvis today. Depo-medrol 80 mg IM given at visit.

## 2017-07-06 ENCOUNTER — Other Ambulatory Visit: Payer: Self-pay | Admitting: Internal Medicine

## 2017-07-06 NOTE — Telephone Encounter (Signed)
Copied from [place] [phone] [phone]. Topic: Quick Communication - Rx Refill/Question >> Jul 06, 2017  1:21 PM Boyd Kerbs wrote: Medication:  Oxycodone HCl 20 MG TABS   Has the patient contacted their pharmacy? Yes.   [facility]   (Agent: If no, request that the patient contact the pharmacy for the refill.)   Preferred Pharmacy (with phone number or street name):   [id number] [address], [place] [place] [address] Phone: [phone] Fax: [phone]     Agent: Please be advised that RX refills may take up to 3 business days. We ask that you follow-up with your pharmacy.

## 2017-07-07 NOTE — Telephone Encounter (Signed)
Control database checked last refill: 06/09/2017

## 2017-07-07 NOTE — Telephone Encounter (Signed)
LOV 06/22/17 Dr. Sharlet Salina [facility] [address]

## 2017-07-08 ENCOUNTER — Other Ambulatory Visit: Payer: Self-pay | Admitting: Internal Medicine

## 2017-07-08 MED ORDER — OXYCODONE HCL 20 MG PO TABS: 20.0000 mg | ORAL_TABLET | ORAL | 0 refills | Status: DC

## 2017-07-08 NOTE — Telephone Encounter (Signed)
LOV 06/22/17 Dr. Sharlet Salina [facility].

## 2017-07-08 NOTE — Telephone Encounter (Signed)
Pt would like to go out of town, he needs to have refill before he can leave

## 2017-07-08 NOTE — Telephone Encounter (Signed)
Copied from [place]. Topic: Quick Communication - Rx Refill/Question >> Jul 08, 2017  9:30 AM Boyd Kerbs wrote:  Medication:  Oxycodone HCl 20 MG TABS  Please call patient and let him know.    Has the patient contacted their pharmacy? Yes.     (Agent: If no, request that the patient contact the pharmacy for the refill.)  [facility] [address] [facility] [address] [facility] not [facility] enough [facility].   Preferred Pharmacy (with phone number or street name):   [id number] [place] [place] [address] Phone: [phone] Fax: [phone]     Agent: Please be advised that RX refills may take up to 3 business days. We ask that you follow-up with your pharmacy.

## 2017-07-08 NOTE — Addendum Note (Signed)
Addended by: [name] A on: 07/08/2017 10:16 AM   Modules accepted: Orders

## 2017-07-08 NOTE — Telephone Encounter (Signed)
Sent in, this is a one time exception. In the future we are only able to send to one pharmacy per his contract. If they do not have medication he will have have to wait until they can order. Please be sure to call and cancel the rx at both other [facility] pharmac[facility] we have sent to and only 1 prescription left.

## 2017-07-08 NOTE — Telephone Encounter (Signed)
Pt [facility] [place], and they are out of medication, can you please send to [place].  Please call pt and let him know

## 2017-07-08 NOTE — Telephone Encounter (Signed)
Cancelled at both pharmacies and patient informed of MD response

## 2017-07-08 NOTE — Addendum Note (Signed)
Addended by: [name] A on: 07/08/2017 10:52 AM   Modules accepted: Orders

## 2017-07-08 NOTE — Telephone Encounter (Signed)
[facility] [address] and cancelled script. But patient states he called [place] back and they told him that they only have 90 pills and he was told the one on [address] has a bulk of oxycodone and could get them refilled there. Was told if he picked up the 90 today he would not be able to get the rest of his medication, patient apologizes profusely said he called an hour ago to inform us the change but we never got that message

## 2017-07-08 NOTE — Telephone Encounter (Signed)
Have sent to alternative [facility], please call and cancel prior script at [address]

## 2017-08-01 ENCOUNTER — Other Ambulatory Visit: Payer: Self-pay | Admitting: Family

## 2017-08-06 ENCOUNTER — Other Ambulatory Visit: Payer: Self-pay | Admitting: Internal Medicine

## 2017-08-06 MED ORDER — OXYCODONE HCL 20 MG PO TABS: 20.0000 mg | ORAL_TABLET | ORAL | 0 refills | Status: DC

## 2017-08-06 NOTE — Telephone Encounter (Signed)
Rx refill request: Oxycodone 20 mg  LOV: 06/22/17  PCP: [place]: [facility] [address]

## 2017-08-06 NOTE — Telephone Encounter (Signed)
Copied from [place] [phone]. Topic: Quick Communication - See Telephone Encounter >> Aug 06, 2017  9:51 AM Conception Chancy, NT wrote: CRM for notification. See Telephone encounter for: 08/06/17.  Patient is calling and requesting a refill on Oxycodone HCl 20 MG TABS  please advise. He states the only pharmacy that has them is [address] and wants to make sure that it gets sent there.   [id number] [address] [place] [place] [address] Phone: [phone] Fax: [phone]

## 2017-08-06 NOTE — Telephone Encounter (Signed)
[place] Controlled Substance Database checked. Last filled on 07/08/17 #220 for 18 days

## 2017-08-10 NOTE — Progress Notes (Deleted)
Subjective:   [name] is a 77 y.o. male who presents for Medicare Annual/Subsequent preventive examination.  Review of Systems:  No ROS.  Medicare Wellness Visit. Additional risk factors are reflected in the social history.    Sleep patterns: {SX; SLEEP PATTERNS:18802::"feels rested on waking","does not get up to void","gets up *** times nightly to void","sleeps *** hours nightly"}.    Home Safety/Smoke Alarms: Feels safe in home. Smoke alarms in place.  Living environment; residence and Firearm Safety: {Rehab home environment / accessibility:30080::"no firearms","firearms stored safely"}. Seat Belt Safety/Bike Helmet: Wears seat belt.     Objective:    Vitals: There were no vitals taken for this visit.  There is no height or weight on file to calculate BMI.  Advanced Directives 01/21/2017 01/20/2017 05/15/2016 02/28/2016 12/20/2015 01/18/2015 02/07/2014  Does Patient Have a Medical Advance Directive? No No No No No No No  Would patient like information on creating a medical advance directive? No - Patient declined - Yes (MAU/Ambulatory/Procedural Areas - Information given) No - patient declined information No - patient declined information Yes - Educational materials given No - patient declined information    Tobacco Social History   Tobacco Use  Smoking Status Former Smoker  Smokeless Tobacco Never Used     Counseling given: Not Answered  Past Medical History:  Diagnosis Date  . AAA (abdominal aortic aneurysm) ([place])   . Blindness of left eye   . 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 CAD (coronary artery disease)    a. s/p CABG in 10/2008 with LIMA-LAD, SVG-OM1 with Y-graft to PL branch, and SVG-RCA  . Carotid stenosis   . Cerebrovascular disease   . CHF (congestive heart failure) ([place])   . Cirrhosis ([place])    on CT a/p 07/2013, no longer drinking  . CONGENITAL UNSPEC REDUCTION DEFORMITY LOWER LIMB   . Constipation due to opioid  therapy 2014   began about a year ago  . COPD (chronic obstructive pulmonary disease) ([place])   . Foley catheter in place   . HTN (hypertension)   . Hyperlipidemia   . 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 Neuromuscular disorder ([place])    PT STATES HE HAS BEEN TOLD HE EITHER HAD POLIO OR CEREBRAL PALSY - STATES HIS LEFT LEG IS SHORTER AND AFFECTS HIS BALANCE - HE WEARS ELEVATED SHOE -LEFT  . Pain    BACK, LEGS AND ARMS  . Shortness of breath    EXERTION  . TOBACCO USE, QUIT    Past Surgical History:  Procedure Laterality Date  . CORONARY ARTERY BYPASS GRAFT  11/03/2008   Ricard Dillon - x4: left internal mammary artery to the distal left anterior descending, saphemous vein graft to the first circumflex marginal branch with a Y graft sequentiallly to a left posterolateral branch, saphenous vein graft to the distal right coronary artery  . ELECTROPHYSIOLOGIC STUDY N/A 01/18/2015   Procedure: A-Flutter Ablation;  Surgeon: Deboraha Sprang, MD;  Location: [facility];  Service: Cardiovascular;  Laterality: N/A;  . GREEN LIGHT LASER TURP (TRANSURETHRAL RESECTION OF PROSTATE N/A 02/14/2014   Procedure: GREEN LIGHT LASER TURP (TRANSURETHRAL RESECTION OF PROSTATE;  Surgeon: Festus Aloe, MD;  Location: [facility];  Service: Urology;  Laterality: N/A;   Family History  Problem Relation Age of Onset  . Cirrhosis Mother        died at 64  . Colon cancer  Neg Hx   . Stomach cancer Neg Hx    Social History   Socioeconomic History  . Marital status: Widowed    Spouse name: Not on file  . Number of children: Not on file  . Years of education: Not on file  . Highest education level: Not on file  Occupational History  . Not on file  Social Needs  . Financial resource strain: Not on file  . Food insecurity:    Worry: Not on file    Inability: Not on file  . Transportation needs:    Medical: Not on file     Non-medical: Not on file  Tobacco Use  . Smoking status: Former Research scientist (life sciences)  . Smokeless tobacco: Never Used  Substance and Sexual Activity  . Alcohol use: No    Alcohol/week: 0.0 oz    Comment: History of heavy alcohol use per pt. Quit many years ago1/1/ 2005  . Drug use: No  . Sexual activity: Never  Lifestyle  . Physical activity:    Days per week: Not on file    Minutes per session: Not on file  . Stress: Not on file  Relationships  . Social connections:    Talks on phone: Not on file    Gets together: Not on file    Attends religious service: Not on file    Active member of club or organization: Not on file    Attends meetings of clubs or organizations: Not on file    Relationship status: Not on file  Other Topics Concern  . Not on file  Social History Narrative   lives alone here in [place].  He has 1 son, who lives in the [place], and he has     an uncle, who lives nearby as well.     Outpatient Encounter Medications as of 08/11/2017  Medication Sig  . albuterol (VENTOLIN HFA) 108 (90 Base) MCG/ACT inhaler INHALE 2 PUFFS INTO THE LUNGS EVERY 6 HOURS AS NEEDED FOR WHEEZING.  [name] aspirin EC 81 MG tablet Take 1 tablet (81 mg total) by mouth daily.  . famotidine (PEPCID) 20 MG tablet Take 1 tablet (20 mg total) by mouth 2 (two) times daily as needed for heartburn or indigestion.  . fluticasone (FLONASE) 50 MCG/ACT nasal spray PLACE 1 SPRAY INTO BOTH NOSTRILS EVERY MORNING.  . furosemide (LASIX) 40 MG tablet Take 2 pills in the morning and 1 pill in the afternoon.  [name] guaiFENesin (MUCINEX) 600 MG 12 hr tablet Take 1,200 mg by mouth 2 (two) times daily as needed for cough or to loosen phlegm.   . hydrALAZINE (APRESOLINE) 10 MG tablet TAKE 1 TABLET BY MOUTH 3 TIMES A DAY  . KLOR-CON M20 20 MEQ tablet TAKE 2 TABLETS (40 MEQ TOTAL) BY MOUTH DAILY. EVERY EVENING  . lidocaine (LMX) 4 % cream Apply 1 application topically as needed.  . naloxegol oxalate (MOVANTIK) 12.5 MG TABS  tablet Take 1 tablet (12.5 mg total) by mouth daily.  [name] nystatin ointment (MYCOSTATIN) APPLY TO AFFECTED AREA TWICE A DAY  . Oxycodone HCl 20 MG TABS Take 1-2 tablets (20-40 mg total) by mouth every 4 (four) hours.  . polyethylene glycol (MIRALAX / GLYCOLAX) packet TAKE 17 G (1 PACKET) BY MOUTH DAILY. AS DIRECTED  . pravastatin (PRAVACHOL) 20 MG tablet Take 1 tablet (20 mg total) by mouth daily.  . sertraline (ZOLOFT) 25 MG tablet TAKE 1 TABLET BY MOUTH EVERYDAY AT BEDTIME  . spironolactone (ALDACTONE) 25 MG tablet Take  1 tablet (25 mg total) by mouth daily.  . tamsulosin (FLOMAX) 0.4 MG CAPS capsule TAKE 1 CAPSULE BY MOUTH DAILY AFTER SUPPER.  [name] triamcinolone cream (KENALOG) 0.1 % APPLY TO AFFECTED AREA TWICE A DAY   No facility-administered encounter medications on file as of 08/11/2017.     Activities of Daily Living In your present state of health, do you have any difficulty performing the following activities: 01/21/2017  Hearing? N  Vision? N  Difficulty concentrating or making decisions? N  Walking or climbing stairs? Y  Dressing or bathing? N  Doing errands, shopping? N  Some recent data might be hidden    Patient Care Team: [name], MD as PCP - General (Internal Medicine) Stanford Breed, Denice Bors, MD as Consulting Physician (Cardiology) Rexene Alberts, MD as Consulting Physician (Cardiothoracic Surgery) Rolan Bucco, MD (Urology)   Assessment:   This is a routine wellness examination for [place]. Physical assessment deferred to PCP.   Exercise Activities and Dietary recommendations   Diet (meal preparation, eat out, water intake, caffeinated beverages, dairy products, fruits and vegetables): {Desc; diets:16563}  Goals    None      Fall Risk Fall Risk  01/05/2017 05/15/2016 12/14/2015 12/13/2015 10/30/2014  Falls in the past year? Yes No No No No  Comment - - [place] Telephone Survey: data to providers prior to load - -  Number falls in past yr: 2 or more - -  - -  Injury with Fall? No - - - -    Depression Screen PHQ 2/9 Scores 01/05/2017 05/15/2016 12/13/2015 10/30/2014  PHQ - 2 Score 0 1 0 1    Cognitive Function        Immunization History  Administered Date(s) Administered  . Influenza Split 01/20/2012  . Influenza Whole 01/19/2009, 01/07/2010  . Influenza, High Dose Seasonal PF 01/19/2013, 12/29/2014, 12/26/2016  . Influenza,inj,Quad PF,6+ Mos 12/22/2013, 12/13/2015  . Pneumococcal Conjugate-13 10/30/2014  . Pneumococcal Polysaccharide-23 01/19/2009  . Td 04/03/2012  . Zoster 01/19/2015   Screening Tests Health Maintenance  Topic Date Due  . COLONOSCOPY  01/06/2016  . INFLUENZA VACCINE  11/19/2017  . TETANUS/TDAP  04/03/2022  . PNA vac Low Risk Adult  Completed      Plan:   I have personally reviewed and noted the following in the patient's chart:   . Medical and social history . Use of alcohol, tobacco or illicit drugs  . Current medications and supplements . Functional ability and status . Nutritional status . Physical activity . Advanced directives . List of other physicians . Vitals . Screenings to include cognitive, depression, and falls . Referrals and appointments  In addition, I have reviewed and discussed with patient certain preventive protocols, quality metrics, and best practice recommendations. A written personalized care plan for preventive services as well as general preventive health recommendations were provided to patient.     Michiel Cowboy, RN  08/10/2017

## 2017-08-11 ENCOUNTER — Ambulatory Visit: Payer: Medicare Other

## 2017-08-16 ENCOUNTER — Other Ambulatory Visit: Payer: Self-pay | Admitting: Cardiology

## 2017-08-17 NOTE — Telephone Encounter (Signed)
Rx sent to pharmacy   

## 2017-08-19 ENCOUNTER — Ambulatory Visit: Payer: Self-pay

## 2017-08-19 NOTE — Telephone Encounter (Signed)
Pt calling with c/o discolored urine. Unable to get a color of the urine but the pt describes it as "dirty dishwater." or a dirty green color.  Pt denies fever, flank pain,pain with urination, urgency or frequency. Pt scheduled for appt tomorrow.  Reason for Disposition . All other urine symptoms  Answer Assessment - Initial Assessment Questions 1. SYMPTOM: "What's the main symptom you're concerned about?" (e.g., frequency, incontinence)     discoloration 2. ONSET: "When did the  ________  start?"     today 3. PAIN: "Is there any pain?" If so, ask: "How bad is it?" (Scale: 1-10; mild, moderate, severe)     no 4. CAUSE: "What do you think is causing the symptoms?"     Kidney infection 5. OTHER SYMPTOMS: "Do you have any other symptoms?" (e.g., fever, flank pain, blood in urine, pain with urination)     No  6. PREGNANCY: "Is there any chance you are pregnant?" "When was your last menstrual period?"     n/a  Protocols used: URINARY [facility]

## 2017-08-20 ENCOUNTER — Ambulatory Visit: Payer: Medicare Other | Admitting: Internal Medicine

## 2017-08-20 ENCOUNTER — Telehealth: Payer: Self-pay

## 2017-08-20 NOTE — Telephone Encounter (Signed)
Copied from [place] [id number]. Topic: Quick Communication - Appointment Cancellation >> Aug 20, 2017 11:31 AM Neva Seat wrote: Pt called to cancel his 2:00 appt w/ [facility].  He is doing much better now and will call back if he needs to. >> Aug 20, 2017 11:35 AM Morph[place], Isidoro Donning wrote: [name]

## 2017-08-20 NOTE — Telephone Encounter (Signed)
noted 

## 2017-08-21 DIAGNOSIS — G894 Chronic pain syndrome: Secondary | ICD-10-CM | POA: Diagnosis not present

## 2017-08-21 DIAGNOSIS — M5137 Other intervertebral disc degeneration, lumbosacral region: Secondary | ICD-10-CM | POA: Diagnosis not present

## 2017-08-21 DIAGNOSIS — I70213 Atherosclerosis of native arteries of extremities with intermittent claudication, bilateral legs: Secondary | ICD-10-CM | POA: Diagnosis not present

## 2017-08-21 DIAGNOSIS — M545 Low back pain: Secondary | ICD-10-CM | POA: Diagnosis not present

## 2017-08-21 DIAGNOSIS — M25551 Pain in right hip: Secondary | ICD-10-CM | POA: Diagnosis not present

## 2017-08-21 DIAGNOSIS — I739 Peripheral vascular disease, unspecified: Secondary | ICD-10-CM | POA: Diagnosis not present

## 2017-08-21 DIAGNOSIS — Z79891 Long term (current) use of opiate analgesic: Secondary | ICD-10-CM | POA: Diagnosis not present

## 2017-09-15 ENCOUNTER — Other Ambulatory Visit: Payer: Self-pay | Admitting: Internal Medicine

## 2017-09-16 ENCOUNTER — Ambulatory Visit (INDEPENDENT_AMBULATORY_CARE_PROVIDER_SITE_OTHER): Payer: Medicare Other | Admitting: Family

## 2017-09-16 ENCOUNTER — Ambulatory Visit: Payer: Self-pay | Admitting: *Deleted

## 2017-09-16 ENCOUNTER — Other Ambulatory Visit (INDEPENDENT_AMBULATORY_CARE_PROVIDER_SITE_OTHER): Payer: Medicare Other

## 2017-09-16 ENCOUNTER — Encounter: Payer: Self-pay | Admitting: Family

## 2017-09-16 VITALS — BP 138/62 | HR 62 | Temp 98.4°F | Ht 72.0 in | Wt 169.1 lb

## 2017-09-16 DIAGNOSIS — M5137 Other intervertebral disc degeneration, lumbosacral region: Secondary | ICD-10-CM | POA: Diagnosis not present

## 2017-09-16 DIAGNOSIS — M25551 Pain in right hip: Secondary | ICD-10-CM | POA: Diagnosis not present

## 2017-09-16 DIAGNOSIS — M545 Low back pain: Secondary | ICD-10-CM | POA: Diagnosis not present

## 2017-09-16 DIAGNOSIS — I739 Peripheral vascular disease, unspecified: Secondary | ICD-10-CM | POA: Diagnosis not present

## 2017-09-16 DIAGNOSIS — R319 Hematuria, unspecified: Secondary | ICD-10-CM

## 2017-09-16 DIAGNOSIS — R300 Dysuria: Secondary | ICD-10-CM | POA: Diagnosis not present

## 2017-09-16 DIAGNOSIS — G894 Chronic pain syndrome: Secondary | ICD-10-CM | POA: Diagnosis not present

## 2017-09-16 DIAGNOSIS — I70213 Atherosclerosis of native arteries of extremities with intermittent claudication, bilateral legs: Secondary | ICD-10-CM | POA: Diagnosis not present

## 2017-09-16 DIAGNOSIS — Z79891 Long term (current) use of opiate analgesic: Secondary | ICD-10-CM | POA: Diagnosis not present

## 2017-09-16 LAB — CBC WITH DIFFERENTIAL/PLATELET
Basophils Absolute: 0.1 10*3/uL (ref 0.0–0.1)
Basophils Relative: 1 % (ref 0.0–3.0)
Eosinophils Absolute: 0.1 10*3/uL (ref 0.0–0.7)
Eosinophils Relative: 0.9 % (ref 0.0–5.0)
HCT: 34.4 % — ABNORMAL LOW (ref 39.0–52.0)
Hemoglobin: 11.4 g/dL — ABNORMAL LOW (ref 13.0–17.0)
Lymphocytes Relative: 16.9 % (ref 12.0–46.0)
Lymphs Abs: 1.7 10*3/uL (ref 0.7–4.0)
MCHC: 33.2 g/dL (ref 30.0–36.0)
MCV: 83.3 fl (ref 78.0–100.0)
Monocytes Absolute: 1.2 10*3/uL — ABNORMAL HIGH (ref 0.1–1.0)
Monocytes Relative: 12.5 % — ABNORMAL HIGH (ref 3.0–12.0)
Neutro Abs: 6.8 10*3/uL (ref 1.4–7.7)
Neutrophils Relative %: 68.7 % (ref 43.0–77.0)
Platelets: 252 10*3/uL (ref 150.0–400.0)
RBC: 4.13 Mil/uL — ABNORMAL LOW (ref 4.22–5.81)
RDW: 16.5 % — ABNORMAL HIGH (ref 11.5–15.5)
WBC: 9.8 10*3/uL (ref 4.0–10.5)

## 2017-09-16 LAB — POC URINALSYSI DIPSTICK (AUTOMATED)
Glucose, UA: POSITIVE — AB
Nitrite, UA: POSITIVE
Protein, UA: POSITIVE — AB
Spec Grav, UA: 1.025
Urobilinogen, UA: 2 U/dL — AB
pH, UA: 7

## 2017-09-16 LAB — PSA: PSA: 7.84 ng/mL — ABNORMAL HIGH (ref 0.10–4.00)

## 2017-09-16 MED ORDER — TAMSULOSIN HCL 0.4 MG PO CAPS: 0.4000 mg | ORAL_CAPSULE | Freq: Every day | ORAL | 1 refills | Status: DC

## 2017-09-16 MED ORDER — CIPROFLOXACIN HCL 500 MG PO TABS: 500.0000 mg | ORAL_TABLET | Freq: Two times a day (BID) | ORAL | 0 refills | Status: DC

## 2017-09-16 NOTE — Telephone Encounter (Signed)
Pt called with complaints of blood in urine and painful urination; he states that he noticed this about 0730 this morning; the pt also states that he takes an 81 mg Aspirin daily; recommendations made per nurse protocol to include seeing a physician within 24 hours; pt offered and accepted appointment with Dr [name], [place], 09/16/17 at 1000; he verbalizes understanding; will route to office for notification of this encounter.  Reason for Disposition . Pain or burning with passing urine  Answer Assessment - Initial Assessment Questions 1. COLOR of URINE: "Describe the color of the urine."  (e.g., tea-colored, pink, red, blood clots, bloody)     initially red, then next time urine was "snot colored" 2. ONSET: "When did the bleeding start?"     Today at 0730 3. EPISODES: "How many times has there been blood in the urine?" or "How many times today?"     2 4. PAIN with URINATION: "Is there any pain with passing your urine?" If so, ask: "How bad is the pain?"  (Scale 1-10; or mild, moderate, severe)    - MILD - complains slightly about urination hurting    - MODERATE - interferes with normal activities      - SEVERE - excruciating, unwilling or unable to urinate because of the pain      mild 5. FEVER: "Do you have a fever?" If so, ask: "What is your temperature, how was it measured, and when did it start?"     no 6. ASSOCIATED SYMPTOMS: "Are you passing urine more frequently than usual?"     no 7. OTHER SYMPTOMS: "Do you have any other symptoms?" (e.g., back/flank pain, abdominal pain, vomiting)     no 8. PREGNANCY: "Is there any chance you are pregnant?" "When was your last menstrual period?"     n/a  Protocols used: URINE - BLOOD IN-A-AH

## 2017-09-16 NOTE — Telephone Encounter (Signed)
Patient is seeing laura was told wrong date and time for appointment with Dr. Sharlet Salina

## 2017-09-16 NOTE — Telephone Encounter (Signed)
FYI

## 2017-09-16 NOTE — Patient Instructions (Signed)
Prostatitis Prostatitis is swelling of the prostate gland. The prostate helps to make semen. It is below a man's bladder, in front of the rectum. There are different types of prostatitis. Follow these instructions at home:  Take over-the-counter and prescription medicines only as told by your doctor.  If you were prescribed an antibiotic medicine, take it as told by your doctor. Do not stop taking the antibiotic even if you start to feel better.  If your doctor prescribed exercises, do them as directed.  Take sitz baths as told by your doctor. To take a sitz bath, sit in warm water that is deep enough to cover your hips and butt.  Keep all follow-up visits as told by your doctor. This is important. Contact a doctor if:  Your symptoms get worse.  You have a fever. Get help right away if:  You have chills.  You feel sick to your stomach (nauseous).  You throw up (vomit).  You feel light-headed.  You feel like you might pass out (faint).  You cannot pee (urinate).  You have blood or clumps of blood (blood clots) in your pee (urine). This information is not intended to replace advice given to you by your health care provider. Make sure you discuss any questions you have with your health care provider. Document Released: 10/07/2011 Document Revised: 12/27/2015 Document Reviewed: 12/27/2015 Elsevier Interactive Patient Education  2017 [employer]  

## 2017-09-16 NOTE — Progress Notes (Signed)
[name] is a 77 y.o. male with the following history as recorded in Epic[employer]:  Patient Active Problem List   Diagnosis Date Noted  . Right hip pain 06/22/2017  . Chronic diastolic heart failure ([place]) 03/16/2017  . CKD (chronic kidney disease) stage 3, GFR 30-59 ml/min (HCC) 03/16/2017  . Diastolic dysfunction 81/19/1478  . Opiate dependence ([place]) 02/13/2017  . Uremia 01/21/2017  . Acute renal failure superimposed on stage 3 chronic kidney disease ([place]) 01/21/2017  . Atrial flutter ([place]) 11/24/2014  . Aortic stenosis 03/24/2014  . 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 Cirrhosis ([place]) 07/28/2013  . Bladder outlet obstruction 07/28/2013  . Impaired glucose metabolism   . Chronic back pain   . Benign neoplasm of colon 01/08/2011  . Abdominal aortic aneurysm ([place]) 08/12/2010  . TOBACCO USE, QUIT 04/11/2009  . Occlusion and stenosis of carotid artery 01/08/2009  . Hyperlipidemia 11/13/2008  . ANEMIA 11/13/2008  . Essential hypertension 11/13/2008  . Coronary atherosclerosis 11/13/2008  . COPD (chronic obstructive pulmonary disease) ([place]) 11/13/2008    Current Outpatient Medications  Medication Sig Dispense Refill  . albuterol (VENTOLIN HFA) 108 (90 Base) MCG/ACT inhaler INHALE 2 PUFFS INTO THE LUNGS EVERY 6 HOURS AS NEEDED FOR WHEEZING. 54 Inhaler 1  . aspirin EC 81 MG tablet Take 1 tablet (81 mg total) by mouth daily.    [name] BELBUCA 300 MCG FILM PLACE 1 FILM AGAINST THE INSIDE OF THE CHEEK HOLDING IN PLACE FOR 5 SECONDS 2 TIMES A DAY  0  . fluticasone (FLONASE) 50 MCG/ACT nasal spray PLACE 1 SPRAY INTO BOTH NOSTRILS EVERY MORNING. 16 g 3  . furosemide (LASIX) 40 MG tablet Take 2 pills in the morning and 1 pill in the afternoon. 90 tablet 6  . guaiFENesin (MUCINEX) 600 MG 12 hr tablet Take 1,200 mg by mouth 2 (two) times daily as needed for cough or to loosen phlegm.     . hydrALAZINE (APRESOLINE) 10 MG tablet TAKE 1 TABLET BY MOUTH 3 TIMES A DAY 270  tablet 1  . KLOR-CON M20 20 MEQ tablet TAKE 2 TABLETS (40 MEQ TOTAL) BY MOUTH DAILY. EVERY EVENING 180 tablet 2  . lidocaine (LMX) 4 % cream Apply 1 application topically as needed.    . nystatin ointment (MYCOSTATIN) APPLY TO AFFECTED AREA TWICE A DAY 30 g 0  . Oxycodone HCl 20 MG TABS Take 1-2 tablets (20-40 mg total) by mouth every 4 (four) hours. 220 tablet 0  . pravastatin (PRAVACHOL) 20 MG tablet Take 1 tablet (20 mg total) by mouth daily. 90 tablet 3  . sertraline (ZOLOFT) 25 MG tablet TAKE 1 TABLET BY MOUTH EVERYDAY AT BEDTIME 30 tablet 3  . spironolactone (ALDACTONE) 25 MG tablet TAKE 1 TABLET BY MOUTH EVERY DAY 30 tablet 3  . tamsulosin (FLOMAX) 0.4 MG CAPS capsule Take 1 capsule (0.4 mg total) by mouth daily. 90 capsule 1  . triamcinolone cream (KENALOG) 0.1 % APPLY TO AFFECTED AREA TWICE A DAY 30 g 0  . ciprofloxacin (CIPRO) 500 MG tablet Take 1 tablet (500 mg total) by mouth 2 (two) times daily. 28 tablet 0   No current facility-administered medications for this visit.     Allergies: Doxycycline; Amlodipine; Fish allergy; Hydrocodone; Other; and Tylenol [acetaminophen]  Past Medical History:  Diagnosis Date  . AAA (abdominal aortic aneurysm) ([place])   . Blindness of left eye   . 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 CAD (coronary artery  disease)    a. s/p CABG in 10/2008 with LIMA-LAD, SVG-OM1 with Y-graft to PL branch, and SVG-RCA  . Carotid stenosis   . Cerebrovascular disease   . CHF (congestive heart failure) ([place])   . Cirrhosis ([place])    on CT a/p 07/2013, no longer drinking  . CONGENITAL UNSPEC REDUCTION DEFORMITY LOWER LIMB   . Constipation due to opioid therapy 2014   began about a year ago  . COPD (chronic obstructive pulmonary disease) ([place])   . Foley catheter in place   . HTN (hypertension)   . Hyperlipidemia   . 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  Neuromuscular disorder ([place])    PT STATES HE HAS BEEN TOLD HE EITHER HAD POLIO OR CEREBRAL PALSY - STATES HIS LEFT LEG IS SHORTER AND AFFECTS HIS BALANCE - HE WEARS ELEVATED SHOE -LEFT  . Pain    BACK, LEGS AND ARMS  . Shortness of breath    EXERTION  . TOBACCO USE, QUIT     Past Surgical History:  Procedure Laterality Date  . CORONARY ARTERY BYPASS GRAFT  11/03/2008   Ricard Dillon - x4: left internal mammary artery to the distal left anterior descending, saphemous vein graft to the first circumflex marginal branch with a Y graft sequentiallly to a left posterolateral branch, saphenous vein graft to the distal right coronary artery  . ELECTROPHYSIOLOGIC STUDY N/A 01/18/2015   Procedure: A-Flutter Ablation;  Surgeon: Deboraha Sprang, MD;  Location: [facility];  Service: Cardiovascular;  Laterality: N/A;  . GREEN LIGHT LASER TURP (TRANSURETHRAL RESECTION OF PROSTATE N/A 02/14/2014   Procedure: GREEN LIGHT LASER TURP (TRANSURETHRAL RESECTION OF PROSTATE;  Surgeon: Festus Aloe, MD;  Location: [facility];  Service: Urology;  Laterality: N/A;    Family History  Problem Relation Age of Onset  . Cirrhosis Mother        died at 60  . Colon cancer Neg Hx   . Stomach cancer Neg Hx     Social History   Tobacco Use  . Smoking status: Former Research scientist (life sciences)  . Smokeless tobacco: Never Used  Substance Use Topics  . Alcohol use: No    Alcohol/week: 0.0 oz    Comment: History of heavy alcohol use per pt. Quit many years ago1/1/ 2005    Subjective:  Patient presents with 1 day history of sudden onset of burning with urination, blood in urine and frequency/ urgency; denies any new onset back pain; no fever; no history of kidney stones; no sense that he is not able to empty his bladder; has had prostate surgery in the past due to enlargement;   Objective:  Vitals:   09/16/17 1538  BP: 138/62  Pulse: 62  Temp: 98.4 F (36.9 C)  TempSrc: Oral  SpO2: 97%  Weight: 169 lb 1.3 oz (76.7 kg)  Height: 6'  (1.829 m)    General: Well developed, well nourished, in no acute distress  Skin : Warm and dry.  Head: Normocephalic and atraumatic  Lungs: Respirations unlabored; clear to auscultation bilaterally without wheeze, rales, rhonchi  CVS exam: normal rate and regular rhythm.  Neurologic: Alert and oriented; speech intact; face symmetrical; uses walker for mobility; CNII-XII intact without focal deficit  Assessment:  1. Dysuria   2. Hematuria, unspecified type     Plan:  Suspect acute prostatitis; check U/A and urine culture, check CBC, PSA today; start Cipro 500 mg bid x 14 days  and Flomax 0.4 mg qd; increase water intake and follow up to be determined based on labs.   No follow-ups on file.  Orders Placed This Encounter  Procedures  . Urine Culture    Standing Status:   Future    Number of Occurrences:   1    Standing Expiration Date:   09/16/2018  . CBC w/Diff    Standing Status:   Future    Number of Occurrences:   1    Standing Expiration Date:   09/16/2018  . PSA    Standing Status:   Future    Number of Occurrences:   1    Standing Expiration Date:   09/16/2018  . POCT Urinalysis Dipstick (Automated)    Requested Prescriptions   Signed Prescriptions Disp Refills  . ciprofloxacin (CIPRO) 500 MG tablet 28 tablet 0    Sig: Take 1 tablet (500 mg total) by mouth 2 (two) times daily.  . tamsulosin (FLOMAX) 0.4 MG CAPS capsule 90 capsule 1    Sig: Take 1 capsule (0.4 mg total) by mouth daily.

## 2017-09-17 ENCOUNTER — Ambulatory Visit: Payer: Medicare Other | Admitting: Internal Medicine

## 2017-09-17 LAB — URINE CULTURE
MICRO NUMBER:: 90646565
SPECIMEN QUALITY:: ADEQUATE

## 2017-10-02 DIAGNOSIS — I70213 Atherosclerosis of native arteries of extremities with intermittent claudication, bilateral legs: Secondary | ICD-10-CM | POA: Diagnosis not present

## 2017-10-02 DIAGNOSIS — M5137 Other intervertebral disc degeneration, lumbosacral region: Secondary | ICD-10-CM | POA: Diagnosis not present

## 2017-10-02 DIAGNOSIS — M25551 Pain in right hip: Secondary | ICD-10-CM | POA: Diagnosis not present

## 2017-10-02 DIAGNOSIS — M545 Low back pain: Secondary | ICD-10-CM | POA: Diagnosis not present

## 2017-10-02 DIAGNOSIS — Z79891 Long term (current) use of opiate analgesic: Secondary | ICD-10-CM | POA: Diagnosis not present

## 2017-10-02 DIAGNOSIS — I739 Peripheral vascular disease, unspecified: Secondary | ICD-10-CM | POA: Diagnosis not present

## 2017-10-02 DIAGNOSIS — G894 Chronic pain syndrome: Secondary | ICD-10-CM | POA: Diagnosis not present

## 2017-10-12 ENCOUNTER — Encounter: Payer: Self-pay | Admitting: Internal Medicine

## 2017-10-12 ENCOUNTER — Telehealth: Payer: Self-pay | Admitting: Cardiology

## 2017-10-12 DIAGNOSIS — I714 Abdominal aortic aneurysm, without rupture, unspecified: Secondary | ICD-10-CM

## 2017-10-12 DIAGNOSIS — I6523 Occlusion and stenosis of bilateral carotid arteries: Secondary | ICD-10-CM

## 2017-10-12 NOTE — Telephone Encounter (Signed)
New Message:       Pt is calling and states he normally has a test done every year but hasn't been called about it.

## 2017-10-12 NOTE — Telephone Encounter (Signed)
Spoke with pt, follow up carotid and abdominal US scheduled in august. 1 year follow up scheduled with dr Stanford Breed.

## 2017-10-13 ENCOUNTER — Other Ambulatory Visit: Payer: Self-pay | Admitting: Family

## 2017-10-13 DIAGNOSIS — N410 Acute prostatitis: Secondary | ICD-10-CM

## 2017-10-13 DIAGNOSIS — R350 Frequency of micturition: Secondary | ICD-10-CM

## 2017-10-13 MED ORDER — LACTULOSE 10 GM/15ML PO SOLN: 20.0000 g | Freq: Two times a day (BID) | ORAL | 6 refills | Status: DC | PRN

## 2017-10-21 ENCOUNTER — Other Ambulatory Visit (INDEPENDENT_AMBULATORY_CARE_PROVIDER_SITE_OTHER): Payer: Medicare Other

## 2017-10-21 ENCOUNTER — Other Ambulatory Visit: Payer: Self-pay | Admitting: Family

## 2017-10-21 DIAGNOSIS — R350 Frequency of micturition: Secondary | ICD-10-CM

## 2017-10-21 DIAGNOSIS — N410 Acute prostatitis: Secondary | ICD-10-CM | POA: Diagnosis not present

## 2017-10-21 DIAGNOSIS — R972 Elevated prostate specific antigen [PSA]: Secondary | ICD-10-CM

## 2017-10-21 LAB — PSA: PSA: 10.64 ng/mL — ABNORMAL HIGH (ref 0.10–4.00)

## 2017-11-02 DIAGNOSIS — R3121 Asymptomatic microscopic hematuria: Secondary | ICD-10-CM | POA: Diagnosis not present

## 2017-11-02 DIAGNOSIS — R972 Elevated prostate specific antigen [PSA]: Secondary | ICD-10-CM | POA: Diagnosis not present

## 2017-11-02 DIAGNOSIS — R3911 Hesitancy of micturition: Secondary | ICD-10-CM | POA: Diagnosis not present

## 2017-11-02 DIAGNOSIS — N401 Enlarged prostate with lower urinary tract symptoms: Secondary | ICD-10-CM | POA: Diagnosis not present

## 2017-11-04 ENCOUNTER — Telehealth: Payer: Self-pay | Admitting: Internal Medicine

## 2017-11-04 ENCOUNTER — Encounter: Payer: Self-pay | Admitting: Internal Medicine

## 2017-11-04 NOTE — Telephone Encounter (Signed)
Copied from [place] [phone]. Topic: Inquiry >> Nov 04, 2017  2:34 PM Oliver Pila B wrote: Reason for CRM: pt called to ask if his pcp is in the [employer] [facility]; pt was told by insurance to check w/ office but pt was notified the best option is to contact back the insurance company; insurance apparently couldn't find a number or provider for the location, pt wanted to at least speak w/ someone in facility

## 2017-11-05 NOTE — Telephone Encounter (Signed)
Spoke with patient today.  Asked patient to reach back out to [facility] with [facility] as MD's last name.

## 2017-11-06 DIAGNOSIS — I70213 Atherosclerosis of native arteries of extremities with intermittent claudication, bilateral legs: Secondary | ICD-10-CM | POA: Diagnosis not present

## 2017-11-06 DIAGNOSIS — M5137 Other intervertebral disc degeneration, lumbosacral region: Secondary | ICD-10-CM | POA: Diagnosis not present

## 2017-11-06 DIAGNOSIS — M545 Low back pain: Secondary | ICD-10-CM | POA: Diagnosis not present

## 2017-11-06 DIAGNOSIS — I739 Peripheral vascular disease, unspecified: Secondary | ICD-10-CM | POA: Diagnosis not present

## 2017-11-06 DIAGNOSIS — M25551 Pain in right hip: Secondary | ICD-10-CM | POA: Diagnosis not present

## 2017-11-06 DIAGNOSIS — Z79891 Long term (current) use of opiate analgesic: Secondary | ICD-10-CM | POA: Diagnosis not present

## 2017-11-06 DIAGNOSIS — G894 Chronic pain syndrome: Secondary | ICD-10-CM | POA: Diagnosis not present

## 2017-12-01 ENCOUNTER — Other Ambulatory Visit: Payer: Self-pay | Admitting: Internal Medicine

## 2017-12-02 ENCOUNTER — Ambulatory Visit (HOSPITAL_COMMUNITY): Payer: Medicare Other

## 2017-12-02 ENCOUNTER — Ambulatory Visit (HOSPITAL_COMMUNITY)
Admission: RE | Admit: 2017-12-02 | Payer: Medicare Other | Source: Ambulatory Visit | Attending: Cardiology | Admitting: Cardiology

## 2017-12-06 ENCOUNTER — Other Ambulatory Visit: Payer: Self-pay | Admitting: Internal Medicine

## 2017-12-10 ENCOUNTER — Ambulatory Visit (HOSPITAL_COMMUNITY)
Admission: RE | Admit: 2017-12-10 | Discharge: 2017-12-10 | Disposition: A | Discharge status: Home / Self Care | Payer: Medicare Other | Source: Ambulatory Visit | Attending: Cardiology | Admitting: Cardiology

## 2017-12-10 DIAGNOSIS — I70213 Atherosclerosis of native arteries of extremities with intermittent claudication, bilateral legs: Secondary | ICD-10-CM | POA: Diagnosis not present

## 2017-12-10 DIAGNOSIS — I714 Abdominal aortic aneurysm, without rupture, unspecified: Secondary | ICD-10-CM

## 2017-12-10 DIAGNOSIS — M25551 Pain in right hip: Secondary | ICD-10-CM | POA: Diagnosis not present

## 2017-12-10 DIAGNOSIS — M5137 Other intervertebral disc degeneration, lumbosacral region: Secondary | ICD-10-CM | POA: Diagnosis not present

## 2017-12-10 DIAGNOSIS — G894 Chronic pain syndrome: Secondary | ICD-10-CM | POA: Diagnosis not present

## 2017-12-10 DIAGNOSIS — M545 Low back pain: Secondary | ICD-10-CM | POA: Diagnosis not present

## 2017-12-10 DIAGNOSIS — I6523 Occlusion and stenosis of bilateral carotid arteries: Secondary | ICD-10-CM | POA: Insufficient documentation

## 2017-12-10 DIAGNOSIS — I739 Peripheral vascular disease, unspecified: Secondary | ICD-10-CM | POA: Diagnosis not present

## 2017-12-10 DIAGNOSIS — Z79891 Long term (current) use of opiate analgesic: Secondary | ICD-10-CM | POA: Diagnosis not present

## 2017-12-14 ENCOUNTER — Other Ambulatory Visit: Payer: Self-pay | Admitting: *Deleted

## 2017-12-14 DIAGNOSIS — I714 Abdominal aortic aneurysm, without rupture, unspecified: Secondary | ICD-10-CM

## 2017-12-14 DIAGNOSIS — I6523 Occlusion and stenosis of bilateral carotid arteries: Secondary | ICD-10-CM

## 2017-12-15 ENCOUNTER — Encounter: Payer: Self-pay | Admitting: Internal Medicine

## 2017-12-15 DIAGNOSIS — M549 Dorsalgia, unspecified: Principal | ICD-10-CM

## 2017-12-15 DIAGNOSIS — G8929 Other chronic pain: Secondary | ICD-10-CM

## 2017-12-17 ENCOUNTER — Encounter: Payer: Self-pay | Admitting: Internal Medicine

## 2017-12-17 ENCOUNTER — Ambulatory Visit (INDEPENDENT_AMBULATORY_CARE_PROVIDER_SITE_OTHER): Payer: Medicare Other | Admitting: Internal Medicine

## 2017-12-17 VITALS — BP 140/70 | HR 74 | Temp 97.6°F | Ht 72.0 in | Wt 169.0 lb

## 2017-12-17 DIAGNOSIS — M542 Cervicalgia: Secondary | ICD-10-CM | POA: Diagnosis not present

## 2017-12-17 DIAGNOSIS — R29818 Other symptoms and signs involving the nervous system: Secondary | ICD-10-CM | POA: Diagnosis not present

## 2017-12-17 DIAGNOSIS — I6523 Occlusion and stenosis of bilateral carotid arteries: Secondary | ICD-10-CM | POA: Diagnosis not present

## 2017-12-17 MED ORDER — METHYLPREDNISOLONE ACETATE 40 MG/ML IJ SUSP
40.0000 mg | Freq: Once | INTRAMUSCULAR | Status: AC
  Administered 2017-12-17: 40 mg via INTRAMUSCULAR

## 2017-12-17 MED ORDER — PREDNISONE 20 MG PO TABS: 40.0000 mg | ORAL_TABLET | Freq: Every day | ORAL | 0 refills | Status: DC

## 2017-12-17 NOTE — Patient Instructions (Signed)
We will get the MRI of the head and neck.   We have given you a steroid shot today and have sent in prednisone to take 2 pills daily for 5 days.

## 2017-12-17 NOTE — Progress Notes (Signed)
   Subjective:    Patient ID: [name], male    DOB: 1940/06/04, 77 y.o.   MRN: [id number]  HPI The patient is a 77 YO man coming in for new pain in his head. Started within the last several months. He does have chronic pain in his back but this is significantly different. He is having pain in his head and some tingling. He also has pain in his neck. He denies vision changes, denies weakness.   Review of Systems  Constitutional: Negative.   HENT: Negative.   Eyes: Negative.   Respiratory: Negative for cough, chest tightness and shortness of breath.   Cardiovascular: Negative for chest pain, palpitations and leg swelling.  Gastrointestinal: Negative for abdominal distention, abdominal pain, constipation, diarrhea, nausea and vomiting.  Musculoskeletal: Positive for neck pain and neck stiffness.  Skin: Negative.   Neurological: Positive for headaches.  Psychiatric/Behavioral: Negative.       Objective:   Physical Exam  Constitutional: He is oriented to person, place, and time. He appears well-developed and well-nourished.  HENT:  Head: Normocephalic and atraumatic.  Eyes: EOM are normal.  Neck: Normal range of motion.  Cardiovascular: Normal rate and regular rhythm.  Pulmonary/Chest: Effort normal and breath sounds normal. No respiratory distress. He has no wheezes. He has no rales.  Abdominal: Soft. Bowel sounds are normal. He exhibits no distension. There is no tenderness. There is no rebound.  Musculoskeletal: He exhibits tenderness. He exhibits no edema.  Neurological: He is alert and oriented to person, place, and time. Coordination normal.  Skin: Skin is warm and dry.  Psychiatric: He has a normal mood and affect.   Vitals:   12/17/17 1306  BP: 140/70  Pulse: 74  Temp: 97.6 F (36.4 C)  TempSrc: Oral  SpO2: 97%  Weight: 169 lb (76.7 kg)  Height: 6' (1.829 m)      Assessment & Plan:  Depo-medrol 40 mg IM given at visit

## 2017-12-18 DIAGNOSIS — M542 Cervicalgia: Secondary | ICD-10-CM | POA: Insufficient documentation

## 2017-12-18 NOTE — Assessment & Plan Note (Addendum)
Concerning given new headaches and neck pain. Ordered MRI brain and cervical spine looking for impingement or past stroke. Depo-medrol 40 mg IM given at visit and prednisone burst sent in.

## 2017-12-22 ENCOUNTER — Other Ambulatory Visit: Payer: Self-pay | Admitting: Cardiology

## 2017-12-22 ENCOUNTER — Encounter: Payer: Self-pay | Admitting: Internal Medicine

## 2018-01-03 ENCOUNTER — Other Ambulatory Visit: Payer: Medicare Other

## 2018-01-03 ENCOUNTER — Inpatient Hospital Stay: Admission: RE | Admit: 2018-01-03 | Payer: Medicare Other | Source: Ambulatory Visit

## 2018-01-07 DIAGNOSIS — M5137 Other intervertebral disc degeneration, lumbosacral region: Secondary | ICD-10-CM | POA: Diagnosis not present

## 2018-01-07 DIAGNOSIS — I739 Peripheral vascular disease, unspecified: Secondary | ICD-10-CM | POA: Diagnosis not present

## 2018-01-07 DIAGNOSIS — M545 Low back pain: Secondary | ICD-10-CM | POA: Diagnosis not present

## 2018-01-07 DIAGNOSIS — M25551 Pain in right hip: Secondary | ICD-10-CM | POA: Diagnosis not present

## 2018-01-07 DIAGNOSIS — G894 Chronic pain syndrome: Secondary | ICD-10-CM | POA: Diagnosis not present

## 2018-01-07 DIAGNOSIS — I70213 Atherosclerosis of native arteries of extremities with intermittent claudication, bilateral legs: Secondary | ICD-10-CM | POA: Diagnosis not present

## 2018-01-07 DIAGNOSIS — Z79891 Long term (current) use of opiate analgesic: Secondary | ICD-10-CM | POA: Diagnosis not present

## 2018-01-08 ENCOUNTER — Other Ambulatory Visit: Payer: Self-pay | Admitting: Internal Medicine

## 2018-01-08 DIAGNOSIS — Z23 Encounter for immunization: Secondary | ICD-10-CM | POA: Diagnosis not present

## 2018-01-10 ENCOUNTER — Ambulatory Visit
Admission: RE | Admit: 2018-01-10 | Discharge: 2018-01-10 | Disposition: A | Discharge status: Home / Self Care | Payer: Medicare Other | Source: Ambulatory Visit | Attending: Internal Medicine | Admitting: Internal Medicine

## 2018-01-10 DIAGNOSIS — M4802 Spinal stenosis, cervical region: Secondary | ICD-10-CM | POA: Diagnosis not present

## 2018-01-10 DIAGNOSIS — R29818 Other symptoms and signs involving the nervous system: Secondary | ICD-10-CM

## 2018-01-10 DIAGNOSIS — M542 Cervicalgia: Secondary | ICD-10-CM

## 2018-01-10 DIAGNOSIS — M5481 Occipital neuralgia: Secondary | ICD-10-CM | POA: Diagnosis not present

## 2018-01-12 ENCOUNTER — Other Ambulatory Visit: Payer: Self-pay | Admitting: Internal Medicine

## 2018-01-14 ENCOUNTER — Telehealth: Payer: Self-pay

## 2018-01-14 DIAGNOSIS — M542 Cervicalgia: Secondary | ICD-10-CM

## 2018-01-14 NOTE — Telephone Encounter (Signed)
Referral for chronic back pain was entered in error, new referral placed for neck pain

## 2018-01-18 ENCOUNTER — Encounter: Payer: Self-pay | Admitting: Internal Medicine

## 2018-01-25 ENCOUNTER — Inpatient Hospital Stay (HOSPITAL_COMMUNITY)
Admission: EM | Admit: 2018-01-25 | Discharge: 2018-01-29 | DRG: 470 | Disposition: A | Discharge status: Skilled Nursing Facility | Payer: Medicare Other | Attending: Internal Medicine | Admitting: Internal Medicine

## 2018-01-25 ENCOUNTER — Emergency Department (HOSPITAL_COMMUNITY): Payer: Medicare Other

## 2018-01-25 ENCOUNTER — Encounter (HOSPITAL_COMMUNITY): Payer: Self-pay

## 2018-01-25 DIAGNOSIS — Z96649 Presence of unspecified artificial hip joint: Secondary | ICD-10-CM

## 2018-01-25 DIAGNOSIS — S72002A Fracture of unspecified part of neck of left femur, initial encounter for closed fracture: Principal | ICD-10-CM | POA: Diagnosis present

## 2018-01-25 DIAGNOSIS — I4891 Unspecified atrial fibrillation: Secondary | ICD-10-CM | POA: Diagnosis not present

## 2018-01-25 DIAGNOSIS — K7460 Unspecified cirrhosis of liver: Secondary | ICD-10-CM | POA: Diagnosis present

## 2018-01-25 DIAGNOSIS — Z91013 Allergy to seafood: Secondary | ICD-10-CM

## 2018-01-25 DIAGNOSIS — N35919 Unspecified urethral stricture, male, unspecified site: Secondary | ICD-10-CM | POA: Diagnosis not present

## 2018-01-25 DIAGNOSIS — Z951 Presence of aortocoronary bypass graft: Secondary | ICD-10-CM | POA: Diagnosis present

## 2018-01-25 DIAGNOSIS — Z888 Allergy status to other drugs, medicaments and biological substances status: Secondary | ICD-10-CM

## 2018-01-25 DIAGNOSIS — Z881 Allergy status to other antibiotic agents status: Secondary | ICD-10-CM

## 2018-01-25 DIAGNOSIS — H5462 Unqualified visual loss, left eye, normal vision right eye: Secondary | ICD-10-CM | POA: Diagnosis present

## 2018-01-25 DIAGNOSIS — I252 Old myocardial infarction: Secondary | ICD-10-CM

## 2018-01-25 DIAGNOSIS — Z7982 Long term (current) use of aspirin: Secondary | ICD-10-CM

## 2018-01-25 DIAGNOSIS — N183 Chronic kidney disease, stage 3 unspecified: Secondary | ICD-10-CM | POA: Diagnosis present

## 2018-01-25 DIAGNOSIS — I130 Hypertensive heart and chronic kidney disease with heart failure and stage 1 through stage 4 chronic kidney disease, or unspecified chronic kidney disease: Secondary | ICD-10-CM | POA: Diagnosis not present

## 2018-01-25 DIAGNOSIS — I2510 Atherosclerotic heart disease of native coronary artery without angina pectoris: Secondary | ICD-10-CM | POA: Diagnosis present

## 2018-01-25 DIAGNOSIS — Z79899 Other long term (current) drug therapy: Secondary | ICD-10-CM

## 2018-01-25 DIAGNOSIS — W19XXXA Unspecified fall, initial encounter: Secondary | ICD-10-CM

## 2018-01-25 DIAGNOSIS — Z87891 Personal history of nicotine dependence: Secondary | ICD-10-CM

## 2018-01-25 DIAGNOSIS — M25561 Pain in right knee: Secondary | ICD-10-CM | POA: Diagnosis not present

## 2018-01-25 DIAGNOSIS — J449 Chronic obstructive pulmonary disease, unspecified: Secondary | ICD-10-CM | POA: Diagnosis not present

## 2018-01-25 DIAGNOSIS — I714 Abdominal aortic aneurysm, without rupture: Secondary | ICD-10-CM | POA: Diagnosis present

## 2018-01-25 DIAGNOSIS — I5032 Chronic diastolic (congestive) heart failure: Secondary | ICD-10-CM | POA: Diagnosis not present

## 2018-01-25 DIAGNOSIS — G809 Cerebral palsy, unspecified: Secondary | ICD-10-CM | POA: Diagnosis present

## 2018-01-25 DIAGNOSIS — R0789 Other chest pain: Secondary | ICD-10-CM | POA: Diagnosis not present

## 2018-01-25 DIAGNOSIS — S8991XA Unspecified injury of right lower leg, initial encounter: Secondary | ICD-10-CM | POA: Diagnosis not present

## 2018-01-25 DIAGNOSIS — M25559 Pain in unspecified hip: Secondary | ICD-10-CM | POA: Diagnosis not present

## 2018-01-25 DIAGNOSIS — Z01818 Encounter for other preprocedural examination: Secondary | ICD-10-CM

## 2018-01-25 DIAGNOSIS — Z886 Allergy status to analgesic agent status: Secondary | ICD-10-CM

## 2018-01-25 DIAGNOSIS — S40812A Abrasion of left upper arm, initial encounter: Secondary | ICD-10-CM | POA: Diagnosis present

## 2018-01-25 DIAGNOSIS — W010XXA Fall on same level from slipping, tripping and stumbling without subsequent striking against object, initial encounter: Secondary | ICD-10-CM | POA: Diagnosis present

## 2018-01-25 DIAGNOSIS — I441 Atrioventricular block, second degree: Secondary | ICD-10-CM | POA: Diagnosis present

## 2018-01-25 DIAGNOSIS — M549 Dorsalgia, unspecified: Secondary | ICD-10-CM | POA: Diagnosis present

## 2018-01-25 DIAGNOSIS — G8929 Other chronic pain: Secondary | ICD-10-CM | POA: Diagnosis present

## 2018-01-25 DIAGNOSIS — E785 Hyperlipidemia, unspecified: Secondary | ICD-10-CM | POA: Diagnosis present

## 2018-01-25 MED ORDER — FENTANYL CITRATE (PF) 100 MCG/2ML IJ SOLN
25.0000 ug | Freq: Once | INTRAMUSCULAR | Status: AC
  Administered 2018-01-25: 25 ug via INTRAMUSCULAR
  Filled 2018-01-25: qty 2

## 2018-01-25 NOTE — ED Triage Notes (Signed)
Pt arrived via [facility] due to a fall earlier today. Denies LOC or hitting head. Pt complaining of left hip pain radiating to left leg.

## 2018-01-25 NOTE — ED Notes (Signed)
Bed: DF00 Expected date:  Expected time:  Means of arrival:  Comments: 77 yo M/Fall hip pain

## 2018-01-25 NOTE — ED Provider Notes (Addendum)
[facility] DEPT Provider Note  CSN: [id number] Arrival date & time: 01/25/18 2251  Chief Complaint(s) Fall  HPI [name] is a 77 y.o. male with extensive past medical history listed below including gait instability from chronic pain who uses a walker, presents to the emergency department after a mechanical fall just prior to arrival.  Patient reports that he was at the gas station and was walking up a ramp when he misstepped lost his balance, causing him to fall forward.  Patient reports that he tried doing any "tuck and roll," but was unsuccessful.  He sustained a small abrasion to the left upper arm and left hip and right knee pain.  Denies any head trauma or loss of consciousness.  His main concern is the left hip pain.  States that it is throbbing and aching in nature.  Moderate to severe.  Exacerbated with movement and range of motion of the hip.  No alleviating factors.  Denies any neck pain, back pain, upper extremity pain, chest pain, abdominal pain, right lower extremity or hip pain.  The history is provided by the patient.     Past Medical History Past Medical History:  Diagnosis Date  . AAA (abdominal aortic aneurysm) ([place])   . Blindness of left eye   . 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 CAD (coronary artery disease)    a. s/p CABG in 10/2008 with LIMA-LAD, SVG-OM1 with Y-graft to PL branch, and SVG-RCA  . Carotid stenosis   . Cerebrovascular disease   . CHF (congestive heart failure) ([place])   . Cirrhosis ([place])    on CT a/p 07/2013, no longer drinking  . CONGENITAL UNSPEC REDUCTION DEFORMITY LOWER LIMB   . Constipation due to opioid therapy 2014   began about a year ago  . COPD (chronic obstructive pulmonary disease) ([place])   . Foley catheter in place   . HTN (hypertension)   . Hyperlipidemia   . 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 Neuromuscular disorder ([place])    PT STATES HE HAS BEEN TOLD HE EITHER HAD POLIO OR CEREBRAL PALSY - STATES HIS LEFT LEG IS SHORTER AND AFFECTS HIS BALANCE - HE WEARS ELEVATED SHOE -LEFT  . Pain    BACK, LEGS AND ARMS  . Shortness of breath    EXERTION  . TOBACCO USE, QUIT    Patient Active Problem List   Diagnosis Date Noted  . Neck pain 12/18/2017  . Right hip pain 06/22/2017  . Chronic diastolic heart failure ([place]) 03/16/2017  . CKD (chronic kidney disease) stage 3, GFR 30-59 ml/min (HCC) 03/16/2017  . Diastolic dysfunction 94/70/9628  . Opiate dependence ([place]) 02/13/2017  . Uremia 01/21/2017  . Acute renal failure superimposed on stage 3 chronic kidney disease ([place]) 01/21/2017  . Atrial flutter ([place]) 11/24/2014  . Aortic stenosis 03/24/2014  . 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 Cirrhosis ([place]) 07/28/2013  . Bladder outlet obstruction 07/28/2013  . Impaired glucose metabolism   . Chronic back pain   . Benign neoplasm of colon 01/08/2011  . Abdominal aortic aneurysm ([place]) 08/12/2010  . TOBACCO USE, QUIT 04/11/2009  . Occlusion and stenosis of carotid artery 01/08/2009  . Hyperlipidemia 11/13/2008  . ANEMIA 11/13/2008  . Essential hypertension 11/13/2008  . Coronary atherosclerosis 11/13/2008  . COPD (chronic obstructive pulmonary disease) ([place]) 11/13/2008   Home Medication(s) Prior to Admission  medications   Medication Sig Start Date End Date Taking? Authorizing Provider  aspirin EC 81 MG tablet Take 1 tablet (81 mg total) by mouth daily. 02/22/15   Deboraha Sprang, MD  BELBUCA 300 MCG FILM PLACE 1 FILM AGAINST THE INSIDE OF THE CHEEK HOLDING IN PLACE FOR 5 SECONDS 2 TIMES A DAY 09/11/17   [provider]  ciprofloxacin (CIPRO) 500 MG tablet Take 1 tablet (500 mg total) by mouth 2 (two) times daily. 09/16/17   Marrian Salvage, FNP  fluticasone [facility]) 50 MCG/ACT nasal spray PLACE 1 SPRAY INTO BOTH NOSTRILS EVERY  MORNING. 12/07/17   [name], MD  furosemide (LASIX) 40 MG tablet Take 2 pills in the morning and 1 pill in the afternoon. 04/24/17   [name], MD  guaiFENesin (MUCINEX) 600 MG 12 hr tablet Take 1,200 mg by mouth 2 (two) times daily as needed for cough or to loosen phlegm.     [provider]  hydrALAZINE (APRESOLINE) 10 MG tablet TAKE 1 TABLET BY MOUTH 3 TIMES A DAY 08/17/17   Lelon Perla, MD  lactulose (CHRONULAC) 10 GM/15ML solution Take 30 mLs (20 g total) by mouth 2 (two) times daily as needed for mild constipation or moderate constipation. 10/13/17   [name], MD  lidocaine (LMX) 4 % cream Apply 1 application topically as needed.    [provider]  nystatin ointment (MYCOSTATIN) APPLY TO AFFECTED AREA TWICE A DAY 04/01/17   [name], MD  Oxycodone HCl 20 MG TABS Take 1-2 tablets (20-40 mg total) by mouth every 4 (four) hours. 08/07/17   [name], MD  potassium chloride SA (KLOR-CON M20) 20 MEQ tablet Take 2 tablets (40 mEq total) by mouth every evening. KEEP OV. 12/23/17   Lelon Perla, MD  pravastatin (PRAVACHOL) 20 MG tablet TAKE 1 TABLET BY MOUTH EVERY DAY 01/12/18   [name], MD  predniSONE (DELTASONE) 20 MG tablet Take 2 tablets (40 mg total) by mouth daily with breakfast. 12/17/17   [name], MD  sertraline (ZOLOFT) 25 MG tablet TAKE 1 TABLET BY MOUTH EVERYDAY AT BEDTIME 06/05/17   [name], MD  spironolactone (ALDACTONE) 25 MG tablet TAKE 1 TABLET BY MOUTH EVERY DAY 01/08/18   [name], MD  tamsulosin (FLOMAX) 0.4 MG CAPS capsule Take 1 capsule (0.4 mg total) by mouth daily. 09/16/17   Marrian Salvage, FNP  triamcinolone cream (KENALOG) 0.1 % APPLY TO AFFECTED AREA TWICE A DAY 03/25/17   [name], MD  VENTOLIN HFA 108 [phone] Base) MCG/ACT inhaler TAKE 2 PUFFS BY MOUTH EVERY 6 HOURS AS NEEDED FOR WHEEZE 12/01/17   [name], MD  Past Surgical History Past Surgical History:  Procedure Laterality Date  . CORONARY ARTERY BYPASS GRAFT  11/03/2008   Ricard Dillon - x4: left internal mammary artery to the distal left anterior descending, saphemous vein graft to the first circumflex marginal branch with a Y graft sequentiallly to a left posterolateral branch, saphenous vein graft to the distal right coronary artery  . ELECTROPHYSIOLOGIC STUDY N/A 01/18/2015   Procedure: A-Flutter Ablation;  Surgeon: Deboraha Sprang, MD;  Location: [facility];  Service: Cardiovascular;  Laterality: N/A;  . GREEN LIGHT LASER TURP (TRANSURETHRAL RESECTION OF PROSTATE N/A 02/14/2014   Procedure: GREEN LIGHT LASER TURP (TRANSURETHRAL RESECTION OF PROSTATE;  Surgeon: Festus Aloe, MD;  Location: [facility];  Service: Urology;  Laterality: N/A;   Family History Family History  Problem Relation Age of Onset  . Cirrhosis Mother        died at 41  . Colon cancer Neg Hx   . Stomach cancer Neg Hx     Social History Social History   Tobacco Use  . Smoking status: Former Research scientist (life sciences)  . Smokeless tobacco: Never Used  Substance Use Topics  . Alcohol use: No    Alcohol/week: 0.0 standard drinks    Comment: History of heavy alcohol use per pt. Quit many years ago1/1/ 2005  . Drug use: No   Allergies Doxycycline; Amlodipine; Fish allergy; Hydrocodone; Other; and Tylenol [acetaminophen]  Review of Systems Review of Systems All other systems are reviewed and are negative for acute change except as noted in the HPI  Physical Exam Vital Signs  I have reviewed the triage vital signs BP (!) 146/79 (BP Location: Left Arm)   Pulse 65   Temp (!) 97.5 F (36.4 C) (Oral)   Resp 15   Ht 5\' 11"  (1.803 m)   Wt 77.1 kg   SpO2 96%   BMI 23.71 kg/m   Physical Exam  Constitutional: He is oriented to person, place, and time. He  appears well-developed and well-nourished. No distress.  HENT:  Head: Normocephalic.  Right Ear: External ear normal.  Left Ear: External ear normal.  Mouth/Throat: Oropharynx is clear and moist.  Eyes: Pupils are equal, round, and reactive to light. Conjunctivae and EOM are normal. Right eye exhibits no discharge. Left eye exhibits no discharge. No scleral icterus.  Neck: Normal range of motion. Neck supple.  Cardiovascular: Regular rhythm and normal heart sounds. Exam reveals no gallop and no friction rub.  No murmur heard. Pulses:      Radial pulses are 2+ on the right side, and 2+ on the left side.       Dorsalis pedis pulses are 2+ on the right side, and 2+ on the left side.  Pulmonary/Chest: Effort normal and breath sounds normal. No stridor. No respiratory distress.  Abdominal: Soft. He exhibits no distension. There is no tenderness.  Musculoskeletal:       Left hip: He exhibits decreased range of motion, tenderness, bony tenderness and deformity (internally rotated). He exhibits no crepitus.       Right knee: He exhibits normal range of motion, no swelling, no effusion, no ecchymosis and no deformity. Tenderness found. Medial joint line tenderness noted.       Cervical back: He exhibits no bony tenderness.       Thoracic back: He exhibits no bony tenderness.       Lumbar back: He exhibits no bony tenderness.       Legs: Clavicle stable. Chest stable to AP/Lat compression. Pelvis  stable to Lat compression. No obvious extremity deformity. No chest or abdominal wall contusion.  Neurological: He is alert and oriented to person, place, and time. GCS eye subscore is 4. GCS verbal subscore is 5. GCS motor subscore is 6.  Moving all extremities   Skin: Skin is warm. He is not diaphoretic.       ED Results and Treatments Labs (all labs ordered are listed, but only abnormal results are displayed) Labs Reviewed  CBC WITH DIFFERENTIAL/PLATELET - Abnormal; Notable for the following  components:      Result Value   RDW 16.2 (*)    Lymphs Abs 0.6 (*)    Monocytes Absolute 1.2 (*)    All other components within normal limits  COMPREHENSIVE METABOLIC PANEL - Abnormal; Notable for the following components:   Glucose, Bld 131 (*)    BUN 37 (*)    Creatinine, Ser 2.16 (*)    Total Bilirubin 1.4 (*)    GFR calc non Af Amer 28 (*)    GFR calc Af Amer 33 (*)    All other components within normal limits  BASIC METABOLIC PANEL - Abnormal; Notable for the following components:   Glucose, Bld 130 (*)    BUN 38 (*)    Creatinine, Ser 2.04 (*)    GFR calc non Af Amer 30 (*)    GFR calc Af Amer 35 (*)    All other components within normal limits  CBC - Abnormal; Notable for the following components:   Hemoglobin 12.6 (*)    HCT 38.9 (*)    RDW 16.1 (*)    All other components within normal limits  TYPE AND SCREEN  ABO/RH                                                                                                                         EKG  EKG Interpretation  Date/Time:  Tuesday January 26 2018 01:39:15 EDT Ventricular Rate:  60 PR Interval:    QRS Duration: 96 QT Interval:  437 QTC Calculation: 437 R Axis:   45 Text Interpretation:  Atrial fibrillation Abnormal R-wave progression, early transition Minimal ST depression, diffuse leads Confirmed by Addison Lank [phone]) on 01/26/2018 6:43:36 AM      Radiology Dg Knee Complete 4 Views Right  Result Date: 01/26/2018 CLINICAL DATA:  Fall with knee pain EXAM: RIGHT KNEE - COMPLETE 4+ VIEW COMPARISON:  None. FINDINGS: No evidence of fracture, dislocation, or joint effusion. No evidence of arthropathy or other focal bone abnormality. Soft tissues are unremarkable. IMPRESSION: Negative. Electronically Signed   By: Ulyses Jarred M.D.   On: 01/26/2018 00:39   Dg Hip Unilat W Or W/o Pelvis 2-3 Views Left  Result Date: 01/26/2018 CLINICAL DATA:  Fall EXAM: DG HIP (WITH OR WITHOUT PELVIS) 2-3V LEFT COMPARISON:  None.  FINDINGS: There is a impacted fracture of the left femoral neck with mild medial angulation. Femoral head remains situated in the acetabulum. IMPRESSION: Impacted fracture  of the left femoral neck. Electronically Signed   By: Ulyses Jarred M.D.   On: 01/26/2018 00:39   Pertinent labs & imaging results that were available during my care of the patient were reviewed by me and considered in my medical decision making (see chart for details).  Medications Ordered in ED Medications  fentaNYL (SUBLIMAZE) injection 50 mcg (has no administration in time range)  fentaNYL (SUBLIMAZE) injection 25 mcg (25 mcg Intramuscular Given 01/25/18 2338)                                                                                                                                    Procedures Procedures  (including critical care time)  Medical Decision Making / ED Course I have reviewed the nursing notes for this encounter and the patient's prior records (if available in EHR or on provided paperwork).    Work-up notable for impacted left femoral neck fracture.  Plain film of the right knee unremarkable.  No other injuries noted on exam requiring further imaging.  Will obtain screening labs, EKG and chest x-ray for preop.  We will consult orthopedic surgery and medicine for admission and continued management.  Spoke with Dr. Alvan Dame who will see during admission.    Final Clinical Impression(s) / ED Diagnoses Final diagnoses:  Fall  Preop examination  Closed fracture of neck of left femur, initial encounter [facility])      This chart was dictated using voice recognition software.  Despite best efforts to proofread,  errors can occur which can change the documentation meaning.     Fatima Blank, MD 01/26/18 [phone]

## 2018-01-26 ENCOUNTER — Inpatient Hospital Stay (HOSPITAL_COMMUNITY): Payer: Medicare Other

## 2018-01-26 ENCOUNTER — Inpatient Hospital Stay (HOSPITAL_COMMUNITY): Payer: Medicare Other | Admitting: Anesthesiology

## 2018-01-26 ENCOUNTER — Emergency Department (HOSPITAL_COMMUNITY): Payer: Medicare Other

## 2018-01-26 ENCOUNTER — Encounter (HOSPITAL_COMMUNITY): Payer: Self-pay | Admitting: Family Medicine

## 2018-01-26 ENCOUNTER — Encounter (HOSPITAL_COMMUNITY)
Admission: EM | Disposition: A | Discharge status: Skilled Nursing Facility | Payer: Self-pay | Source: Home / Self Care | Attending: Internal Medicine

## 2018-01-26 ENCOUNTER — Other Ambulatory Visit: Payer: Self-pay

## 2018-01-26 DIAGNOSIS — S8991XA Unspecified injury of right lower leg, initial encounter: Secondary | ICD-10-CM | POA: Diagnosis not present

## 2018-01-26 DIAGNOSIS — K5900 Constipation, unspecified: Secondary | ICD-10-CM | POA: Diagnosis not present

## 2018-01-26 DIAGNOSIS — S72002A Fracture of unspecified part of neck of left femur, initial encounter for closed fracture: Principal | ICD-10-CM

## 2018-01-26 DIAGNOSIS — I714 Abdominal aortic aneurysm, without rupture: Secondary | ICD-10-CM | POA: Diagnosis not present

## 2018-01-26 DIAGNOSIS — S72002D Fracture of unspecified part of neck of left femur, subsequent encounter for closed fracture with routine healing: Secondary | ICD-10-CM | POA: Diagnosis not present

## 2018-01-26 DIAGNOSIS — Z87891 Personal history of nicotine dependence: Secondary | ICD-10-CM | POA: Diagnosis not present

## 2018-01-26 DIAGNOSIS — M25561 Pain in right knee: Secondary | ICD-10-CM | POA: Diagnosis not present

## 2018-01-26 DIAGNOSIS — R609 Edema, unspecified: Secondary | ICD-10-CM | POA: Diagnosis not present

## 2018-01-26 DIAGNOSIS — Z471 Aftercare following joint replacement surgery: Secondary | ICD-10-CM | POA: Diagnosis not present

## 2018-01-26 DIAGNOSIS — I441 Atrioventricular block, second degree: Secondary | ICD-10-CM | POA: Diagnosis present

## 2018-01-26 DIAGNOSIS — R0789 Other chest pain: Secondary | ICD-10-CM | POA: Diagnosis not present

## 2018-01-26 DIAGNOSIS — E785 Hyperlipidemia, unspecified: Secondary | ICD-10-CM | POA: Diagnosis present

## 2018-01-26 DIAGNOSIS — I4891 Unspecified atrial fibrillation: Secondary | ICD-10-CM | POA: Diagnosis not present

## 2018-01-26 DIAGNOSIS — J449 Chronic obstructive pulmonary disease, unspecified: Secondary | ICD-10-CM | POA: Diagnosis not present

## 2018-01-26 DIAGNOSIS — N35919 Unspecified urethral stricture, male, unspecified site: Secondary | ICD-10-CM | POA: Diagnosis not present

## 2018-01-26 DIAGNOSIS — G809 Cerebral palsy, unspecified: Secondary | ICD-10-CM | POA: Diagnosis not present

## 2018-01-26 DIAGNOSIS — Z951 Presence of aortocoronary bypass graft: Secondary | ICD-10-CM | POA: Diagnosis not present

## 2018-01-26 DIAGNOSIS — M25552 Pain in left hip: Secondary | ICD-10-CM | POA: Diagnosis not present

## 2018-01-26 DIAGNOSIS — Z79899 Other long term (current) drug therapy: Secondary | ICD-10-CM | POA: Diagnosis not present

## 2018-01-26 DIAGNOSIS — M549 Dorsalgia, unspecified: Secondary | ICD-10-CM | POA: Diagnosis not present

## 2018-01-26 DIAGNOSIS — J309 Allergic rhinitis, unspecified: Secondary | ICD-10-CM | POA: Diagnosis not present

## 2018-01-26 DIAGNOSIS — I2510 Atherosclerotic heart disease of native coronary artery without angina pectoris: Secondary | ICD-10-CM

## 2018-01-26 DIAGNOSIS — W010XXA Fall on same level from slipping, tripping and stumbling without subsequent striking against object, initial encounter: Secondary | ICD-10-CM | POA: Diagnosis not present

## 2018-01-26 DIAGNOSIS — I252 Old myocardial infarction: Secondary | ICD-10-CM | POA: Diagnosis not present

## 2018-01-26 DIAGNOSIS — N401 Enlarged prostate with lower urinary tract symptoms: Secondary | ICD-10-CM | POA: Diagnosis not present

## 2018-01-26 DIAGNOSIS — Y92481 Parking lot as the place of occurrence of the external cause: Secondary | ICD-10-CM | POA: Diagnosis not present

## 2018-01-26 DIAGNOSIS — K7460 Unspecified cirrhosis of liver: Secondary | ICD-10-CM | POA: Diagnosis not present

## 2018-01-26 DIAGNOSIS — Z91013 Allergy to seafood: Secondary | ICD-10-CM | POA: Diagnosis not present

## 2018-01-26 DIAGNOSIS — Z466 Encounter for fitting and adjustment of urinary device: Secondary | ICD-10-CM | POA: Diagnosis not present

## 2018-01-26 DIAGNOSIS — N35911 Unspecified urethral stricture, male, meatal: Secondary | ICD-10-CM | POA: Diagnosis not present

## 2018-01-26 DIAGNOSIS — N183 Chronic kidney disease, stage 3 (moderate): Secondary | ICD-10-CM

## 2018-01-26 DIAGNOSIS — M62838 Other muscle spasm: Secondary | ICD-10-CM | POA: Diagnosis not present

## 2018-01-26 DIAGNOSIS — I130 Hypertensive heart and chronic kidney disease with heart failure and stage 1 through stage 4 chronic kidney disease, or unspecified chronic kidney disease: Secondary | ICD-10-CM | POA: Diagnosis not present

## 2018-01-26 DIAGNOSIS — S40812A Abrasion of left upper arm, initial encounter: Secondary | ICD-10-CM | POA: Diagnosis present

## 2018-01-26 DIAGNOSIS — Z96642 Presence of left artificial hip joint: Secondary | ICD-10-CM | POA: Diagnosis not present

## 2018-01-26 DIAGNOSIS — H5462 Unqualified visual loss, left eye, normal vision right eye: Secondary | ICD-10-CM | POA: Diagnosis not present

## 2018-01-26 DIAGNOSIS — Z881 Allergy status to other antibiotic agents status: Secondary | ICD-10-CM | POA: Diagnosis not present

## 2018-01-26 DIAGNOSIS — J42 Unspecified chronic bronchitis: Secondary | ICD-10-CM | POA: Diagnosis not present

## 2018-01-26 DIAGNOSIS — T7840XD Allergy, unspecified, subsequent encounter: Secondary | ICD-10-CM | POA: Diagnosis not present

## 2018-01-26 DIAGNOSIS — I10 Essential (primary) hypertension: Secondary | ICD-10-CM | POA: Diagnosis not present

## 2018-01-26 DIAGNOSIS — Z743 Need for continuous supervision: Secondary | ICD-10-CM | POA: Diagnosis not present

## 2018-01-26 DIAGNOSIS — Z888 Allergy status to other drugs, medicaments and biological substances status: Secondary | ICD-10-CM | POA: Diagnosis not present

## 2018-01-26 DIAGNOSIS — Z4889 Encounter for other specified surgical aftercare: Secondary | ICD-10-CM | POA: Diagnosis not present

## 2018-01-26 DIAGNOSIS — G8929 Other chronic pain: Secondary | ICD-10-CM | POA: Diagnosis not present

## 2018-01-26 DIAGNOSIS — R279 Unspecified lack of coordination: Secondary | ICD-10-CM | POA: Diagnosis not present

## 2018-01-26 DIAGNOSIS — S72042A Displaced fracture of base of neck of left femur, initial encounter for closed fracture: Secondary | ICD-10-CM | POA: Diagnosis not present

## 2018-01-26 DIAGNOSIS — I5032 Chronic diastolic (congestive) heart failure: Secondary | ICD-10-CM | POA: Diagnosis not present

## 2018-01-26 DIAGNOSIS — F329 Major depressive disorder, single episode, unspecified: Secondary | ICD-10-CM | POA: Diagnosis not present

## 2018-01-26 DIAGNOSIS — Z7901 Long term (current) use of anticoagulants: Secondary | ICD-10-CM | POA: Diagnosis not present

## 2018-01-26 DIAGNOSIS — Z886 Allergy status to analgesic agent status: Secondary | ICD-10-CM | POA: Diagnosis not present

## 2018-01-26 DIAGNOSIS — E46 Unspecified protein-calorie malnutrition: Secondary | ICD-10-CM | POA: Diagnosis not present

## 2018-01-26 DIAGNOSIS — R062 Wheezing: Secondary | ICD-10-CM | POA: Diagnosis not present

## 2018-01-26 HISTORY — PX: HIP ARTHROPLASTY: SHX981

## 2018-01-26 LAB — COMPREHENSIVE METABOLIC PANEL
ALT: 27 U/L (ref 0–44)
AST: 40 U/L (ref 15–41)
Albumin: 4.3 g/dL (ref 3.5–5.0)
Alkaline Phosphatase: 82 U/L (ref 38–126)
Anion gap: 13 (ref 5–15)
BUN: 37 mg/dL — ABNORMAL HIGH (ref 8–23)
CO2: 25 mmol/L (ref 22–32)
Calcium: 9.3 mg/dL (ref 8.9–10.3)
Chloride: 101 mmol/L (ref 98–111)
Creatinine, Ser: 2.16 mg/dL — ABNORMAL HIGH (ref 0.61–1.24)
GFR calc Af Amer: 33 mL/min — ABNORMAL LOW (ref >60)
GFR calc non Af Amer: 28 mL/min — ABNORMAL LOW (ref >60)
Glucose, Bld: 131 mg/dL — ABNORMAL HIGH (ref 70–99)
Potassium: 4.5 mmol/L (ref 3.5–5.1)
Sodium: 139 mmol/L (ref 135–145)
Total Bilirubin: 1.4 mg/dL — ABNORMAL HIGH (ref 0.3–1.2)
Total Protein: 7.8 g/dL (ref 6.5–8.1)

## 2018-01-26 LAB — CBC WITH DIFFERENTIAL/PLATELET
Basophils Absolute: 0 10*3/uL (ref 0.0–0.1)
Basophils Relative: 0 %
Eosinophils Absolute: 0 10*3/uL (ref 0.0–0.5)
Eosinophils Relative: 0 %
HCT: 39.5 % (ref 39.0–52.0)
Hemoglobin: 13.1 g/dL (ref 13.0–17.0)
Lymphocytes Relative: 7 %
Lymphs Abs: 0.6 10*3/uL — ABNORMAL LOW (ref 0.7–4.0)
MCH: 28.7 pg (ref 26.0–34.0)
MCHC: 33.2 g/dL (ref 30.0–36.0)
MCV: 86.4 fL (ref 80.0–100.0)
Monocytes Absolute: 1.2 10*3/uL — ABNORMAL HIGH (ref 0.1–1.0)
Monocytes Relative: 13 %
Neutro Abs: 7.7 10*3/uL (ref 1.7–7.7)
Neutrophils Relative %: 80 %
Platelets: 245 10*3/uL (ref 150–400)
RBC: 4.57 MIL/uL (ref 4.22–5.81)
RDW: 16.2 % — ABNORMAL HIGH (ref 11.5–15.5)
WBC: 9.7 10*3/uL (ref 4.0–10.5)

## 2018-01-26 LAB — TYPE AND SCREEN
ABO/RH(D): A NEG
Antibody Screen: NEGATIVE

## 2018-01-26 LAB — BASIC METABOLIC PANEL
Anion gap: 12 (ref 5–15)
BUN: 38 mg/dL — ABNORMAL HIGH (ref 8–23)
CO2: 26 mmol/L (ref 22–32)
Calcium: 9.1 mg/dL (ref 8.9–10.3)
Chloride: 102 mmol/L (ref 98–111)
Creatinine, Ser: 2.04 mg/dL — ABNORMAL HIGH (ref 0.61–1.24)
GFR calc Af Amer: 35 mL/min — ABNORMAL LOW (ref >60)
GFR calc non Af Amer: 30 mL/min — ABNORMAL LOW (ref >60)
Glucose, Bld: 130 mg/dL — ABNORMAL HIGH (ref 70–99)
Potassium: 4.3 mmol/L (ref 3.5–5.1)
Sodium: 140 mmol/L (ref 135–145)

## 2018-01-26 LAB — CBC
HCT: 38.9 % — ABNORMAL LOW (ref 39.0–52.0)
Hemoglobin: 12.6 g/dL — ABNORMAL LOW (ref 13.0–17.0)
MCH: 27.9 pg (ref 26.0–34.0)
MCHC: 32.4 g/dL (ref 30.0–36.0)
MCV: 86.3 fL (ref 80.0–100.0)
Platelets: 233 10*3/uL (ref 150–400)
RBC: 4.51 MIL/uL (ref 4.22–5.81)
RDW: 16.1 % — ABNORMAL HIGH (ref 11.5–15.5)
WBC: 8.7 10*3/uL (ref 4.0–10.5)

## 2018-01-26 LAB — ABO/RH: ABO/RH(D): A NEG

## 2018-01-26 SURGERY — HEMIARTHROPLASTY, HIP, DIRECT ANTERIOR APPROACH, FOR FRACTURE
Anesthesia: General | Site: Hip | Laterality: Left

## 2018-01-26 MED ORDER — MORPHINE SULFATE (PF) 2 MG/ML IV SOLN
1.0000 mg | INTRAVENOUS | Status: DC | PRN
  Administered 2018-01-26: 2 mg via INTRAVENOUS
  Administered 2018-01-26 (×2): 3 mg via INTRAVENOUS
  Administered 2018-01-26: 1 mg via INTRAVENOUS
  Administered 2018-01-28: 2 mg via INTRAVENOUS
  Filled 2018-01-26: qty 2
  Filled 2018-01-26 (×2): qty 1
  Filled 2018-01-26: qty 2
  Filled 2018-01-26 (×2): qty 1

## 2018-01-26 MED ORDER — LIDOCAINE 2% (20 MG/ML) 5 ML SYRINGE
INTRAMUSCULAR | Status: DC | PRN
  Administered 2018-01-26: 100 mg via INTRAVENOUS

## 2018-01-26 MED ORDER — FINASTERIDE 5 MG PO TABS
5.0000 mg | ORAL_TABLET | Freq: Every day | ORAL | Status: DC
  Administered 2018-01-27 – 2018-01-29 (×3): 5 mg via ORAL
  Filled 2018-01-26 (×3): qty 1

## 2018-01-26 MED ORDER — ONDANSETRON HCL 4 MG/2ML IJ SOLN: 4.0000 mg | Freq: Once | INTRAMUSCULAR | Status: DC | PRN

## 2018-01-26 MED ORDER — FENTANYL CITRATE (PF) 100 MCG/2ML IJ SOLN
INTRAMUSCULAR | Status: DC | PRN
  Administered 2018-01-26 (×4): 25 ug via INTRAVENOUS

## 2018-01-26 MED ORDER — TAMSULOSIN HCL 0.4 MG PO CAPS
0.4000 mg | ORAL_CAPSULE | Freq: Every day | ORAL | Status: DC
  Administered 2018-01-27 – 2018-01-29 (×3): 0.4 mg via ORAL
  Filled 2018-01-26 (×3): qty 1

## 2018-01-26 MED ORDER — 0.9 % SODIUM CHLORIDE (POUR BTL) OPTIME
TOPICAL | Status: DC | PRN
  Administered 2018-01-26: 1000 mL

## 2018-01-26 MED ORDER — LACTULOSE 10 GM/15ML PO SOLN
20.0000 g | Freq: Two times a day (BID) | ORAL | Status: DC | PRN
  Administered 2018-01-27 – 2018-01-29 (×4): 20 g via ORAL
  Filled 2018-01-26 (×4): qty 30

## 2018-01-26 MED ORDER — SUCCINYLCHOLINE CHLORIDE 200 MG/10ML IV SOSY
PREFILLED_SYRINGE | INTRAVENOUS | Status: DC | PRN
  Administered 2018-01-26: 120 mg via INTRAVENOUS

## 2018-01-26 MED ORDER — ONDANSETRON HCL 4 MG PO TABS: 4.0000 mg | ORAL_TABLET | Freq: Four times a day (QID) | ORAL | Status: DC | PRN

## 2018-01-26 MED ORDER — STERILE WATER FOR IRRIGATION IR SOLN
Status: DC | PRN
  Administered 2018-01-26: 2000 mL

## 2018-01-26 MED ORDER — SODIUM CHLORIDE 0.9 % IV SOLN
INTRAVENOUS | Status: AC
  Administered 2018-01-26: 03:00:00 via INTRAVENOUS

## 2018-01-26 MED ORDER — SENNOSIDES-DOCUSATE SODIUM 8.6-50 MG PO TABS: 1.0000 | ORAL_TABLET | Freq: Every evening | ORAL | Status: DC | PRN

## 2018-01-26 MED ORDER — ONDANSETRON HCL 4 MG/2ML IJ SOLN
INTRAMUSCULAR | Status: AC
  Filled 2018-01-26: qty 2

## 2018-01-26 MED ORDER — CEFAZOLIN SODIUM-DEXTROSE 2-4 GM/100ML-% IV SOLN
2.0000 g | Freq: Four times a day (QID) | INTRAVENOUS | Status: AC
  Administered 2018-01-26 (×2): 2 g via INTRAVENOUS
  Filled 2018-01-26 (×2): qty 100

## 2018-01-26 MED ORDER — FERROUS SULFATE 325 (65 FE) MG PO TABS
325.0000 mg | ORAL_TABLET | Freq: Three times a day (TID) | ORAL | Status: DC
  Administered 2018-01-27 – 2018-01-29 (×9): 325 mg via ORAL
  Filled 2018-01-26 (×9): qty 1

## 2018-01-26 MED ORDER — DEXAMETHASONE SODIUM PHOSPHATE 10 MG/ML IJ SOLN
INTRAMUSCULAR | Status: AC
  Filled 2018-01-26: qty 1

## 2018-01-26 MED ORDER — PROPOFOL 10 MG/ML IV BOLUS
INTRAVENOUS | Status: DC | PRN
  Administered 2018-01-26: 150 mg via INTRAVENOUS

## 2018-01-26 MED ORDER — OXYCODONE HCL 5 MG PO TABS
30.0000 mg | ORAL_TABLET | ORAL | Status: DC
  Administered 2018-01-26 – 2018-01-29 (×19): 30 mg via ORAL
  Filled 2018-01-26 (×19): qty 6

## 2018-01-26 MED ORDER — MENTHOL 3 MG MT LOZG: 1.0000 | LOZENGE | OROMUCOSAL | Status: DC | PRN

## 2018-01-26 MED ORDER — CEFAZOLIN SODIUM-DEXTROSE 2-4 GM/100ML-% IV SOLN
2.0000 g | INTRAVENOUS | Status: AC
  Administered 2018-01-26 (×2): 2 g via INTRAVENOUS
  Filled 2018-01-26: qty 100

## 2018-01-26 MED ORDER — PRAVASTATIN SODIUM 20 MG PO TABS
20.0000 mg | ORAL_TABLET | Freq: Every day | ORAL | Status: DC
  Administered 2018-01-26 – 2018-01-29 (×4): 20 mg via ORAL
  Filled 2018-01-26 (×4): qty 1

## 2018-01-26 MED ORDER — TRANEXAMIC ACID 1000 MG/10ML IV SOLN
1000.0000 mg | INTRAVENOUS | Status: AC
  Administered 2018-01-26: 1000 mg via INTRAVENOUS
  Filled 2018-01-26: qty 10

## 2018-01-26 MED ORDER — CHLORHEXIDINE GLUCONATE 4 % EX LIQD: 60.0000 mL | Freq: Once | CUTANEOUS | Status: DC

## 2018-01-26 MED ORDER — ROCURONIUM BROMIDE 10 MG/ML (PF) SYRINGE
PREFILLED_SYRINGE | INTRAVENOUS | Status: DC | PRN
  Administered 2018-01-26 (×2): 10 mg via INTRAVENOUS
  Administered 2018-01-26: 40 mg via INTRAVENOUS

## 2018-01-26 MED ORDER — ASPIRIN EC 325 MG PO TBEC
325.0000 mg | DELAYED_RELEASE_TABLET | Freq: Two times a day (BID) | ORAL | Status: DC
  Administered 2018-01-27 – 2018-01-29 (×5): 325 mg via ORAL
  Filled 2018-01-26 (×5): qty 1

## 2018-01-26 MED ORDER — LACTATED RINGERS IV SOLN
INTRAVENOUS | Status: DC | PRN
  Administered 2018-01-26 (×2): via INTRAVENOUS

## 2018-01-26 MED ORDER — ALBUTEROL SULFATE (2.5 MG/3ML) 0.083% IN NEBU: 2.5000 mg | INHALATION_SOLUTION | Freq: Four times a day (QID) | RESPIRATORY_TRACT | Status: DC | PRN

## 2018-01-26 MED ORDER — POVIDONE-IODINE 10 % EX SWAB
2.0000 "application " | Freq: Once | CUTANEOUS | Status: AC
  Administered 2018-01-26: 2 via TOPICAL

## 2018-01-26 MED ORDER — SODIUM CHLORIDE 0.9 % IV SOLN
INTRAVENOUS | Status: DC
  Administered 2018-01-26: 21:00:00 via INTRAVENOUS

## 2018-01-26 MED ORDER — EPHEDRINE SULFATE-NACL 50-0.9 MG/10ML-% IV SOSY
PREFILLED_SYRINGE | INTRAVENOUS | Status: DC | PRN
  Administered 2018-01-26 (×2): 10 mg via INTRAVENOUS

## 2018-01-26 MED ORDER — ONDANSETRON HCL 4 MG/2ML IJ SOLN: 4.0000 mg | Freq: Four times a day (QID) | INTRAMUSCULAR | Status: DC | PRN

## 2018-01-26 MED ORDER — SUGAMMADEX SODIUM 200 MG/2ML IV SOLN
INTRAVENOUS | Status: AC
  Filled 2018-01-26: qty 2

## 2018-01-26 MED ORDER — GUAIFENESIN ER 600 MG PO TB12
1200.0000 mg | ORAL_TABLET | Freq: Two times a day (BID) | ORAL | Status: DC | PRN
  Administered 2018-01-26 – 2018-01-28 (×2): 1200 mg via ORAL
  Filled 2018-01-26 (×2): qty 2

## 2018-01-26 MED ORDER — HYDRALAZINE HCL 10 MG PO TABS
10.0000 mg | ORAL_TABLET | Freq: Three times a day (TID) | ORAL | Status: DC
  Administered 2018-01-26 – 2018-01-29 (×7): 10 mg via ORAL
  Filled 2018-01-26 (×13): qty 1

## 2018-01-26 MED ORDER — FENTANYL CITRATE (PF) 100 MCG/2ML IJ SOLN
INTRAMUSCULAR | Status: AC
  Filled 2018-01-26: qty 4

## 2018-01-26 MED ORDER — FENTANYL CITRATE (PF) 100 MCG/2ML IJ SOLN
INTRAMUSCULAR | Status: AC
  Filled 2018-01-26: qty 2

## 2018-01-26 MED ORDER — OXYCODONE HCL 5 MG/5ML PO SOLN
5.0000 mg | Freq: Once | ORAL | Status: DC | PRN
  Filled 2018-01-26: qty 5

## 2018-01-26 MED ORDER — DOCUSATE SODIUM 100 MG PO CAPS
100.0000 mg | ORAL_CAPSULE | Freq: Two times a day (BID) | ORAL | Status: DC
  Administered 2018-01-26 – 2018-01-29 (×6): 100 mg via ORAL
  Filled 2018-01-26 (×6): qty 1

## 2018-01-26 MED ORDER — SUGAMMADEX SODIUM 200 MG/2ML IV SOLN
INTRAVENOUS | Status: DC | PRN
  Administered 2018-01-26: 200 mg via INTRAVENOUS

## 2018-01-26 MED ORDER — SERTRALINE HCL 25 MG PO TABS
25.0000 mg | ORAL_TABLET | Freq: Every day | ORAL | Status: DC
  Administered 2018-01-27 – 2018-01-29 (×3): 25 mg via ORAL
  Filled 2018-01-26 (×3): qty 1

## 2018-01-26 MED ORDER — DEXAMETHASONE SODIUM PHOSPHATE 10 MG/ML IJ SOLN
INTRAMUSCULAR | Status: DC | PRN
  Administered 2018-01-26: 10 mg via INTRAVENOUS

## 2018-01-26 MED ORDER — ONDANSETRON HCL 4 MG/2ML IJ SOLN
INTRAMUSCULAR | Status: DC | PRN
  Administered 2018-01-26: 4 mg via INTRAVENOUS

## 2018-01-26 MED ORDER — DEXAMETHASONE SODIUM PHOSPHATE 10 MG/ML IJ SOLN: 8.0000 mg | INTRAMUSCULAR | Status: DC

## 2018-01-26 MED ORDER — METOCLOPRAMIDE HCL 5 MG/ML IJ SOLN: 5.0000 mg | Freq: Three times a day (TID) | INTRAMUSCULAR | Status: DC | PRN

## 2018-01-26 MED ORDER — OXYCODONE HCL 5 MG PO TABS: 5.0000 mg | ORAL_TABLET | Freq: Once | ORAL | Status: DC | PRN

## 2018-01-26 MED ORDER — FENTANYL CITRATE (PF) 100 MCG/2ML IJ SOLN
50.0000 ug | Freq: Once | INTRAMUSCULAR | Status: AC
  Administered 2018-01-26: 50 ug via INTRAVENOUS
  Filled 2018-01-26: qty 2

## 2018-01-26 MED ORDER — PHENOL 1.4 % MT LIQD: 1.0000 | OROMUCOSAL | Status: DC | PRN

## 2018-01-26 MED ORDER — PROPOFOL 10 MG/ML IV BOLUS
INTRAVENOUS | Status: AC
  Filled 2018-01-26: qty 40

## 2018-01-26 MED ORDER — METOCLOPRAMIDE HCL 5 MG PO TABS: 5.0000 mg | ORAL_TABLET | Freq: Three times a day (TID) | ORAL | Status: DC | PRN

## 2018-01-26 MED ORDER — FENTANYL CITRATE (PF) 100 MCG/2ML IJ SOLN
25.0000 ug | INTRAMUSCULAR | Status: DC | PRN
  Administered 2018-01-26 (×3): 50 ug via INTRAVENOUS

## 2018-01-26 SURGICAL SUPPLY — 45 items
BAG ZIPLOCK 12X15 (MISCELLANEOUS) ×3 IMPLANT
BLADE SAW SGTL 11.0X1.19X90.0M (BLADE) ×3 IMPLANT
BLADE SAW SGTL 18X1.27X75 (BLADE) ×2 IMPLANT
BLADE SAW SGTL 18X1.27X75MM (BLADE) ×1
COVER SURGICAL LIGHT HANDLE (MISCELLANEOUS) ×3 IMPLANT
COVER WAND RF STERILE (DRAPES) IMPLANT
DERMABOND ADVANCED (GAUZE/BANDAGES/DRESSINGS) ×2
DERMABOND ADVANCED .7 DNX12 (GAUZE/BANDAGES/DRESSINGS) ×1 IMPLANT
DRAPE ORTHO SPLIT 77X108 STRL (DRAPES) ×4
DRAPE POUCH INSTRU U-SHP 10X18 (DRAPES) ×3 IMPLANT
DRAPE SURG 17X11 SM STRL (DRAPES) ×3 IMPLANT
DRAPE SURG ORHT 6 SPLT 77X108 (DRAPES) ×2 IMPLANT
DRAPE U-SHAPE 47X51 STRL (DRAPES) ×3 IMPLANT
DRSG AQUACEL AG ADV 3.5X10 (GAUZE/BANDAGES/DRESSINGS) ×3 IMPLANT
DRSG TEGADERM 4X4.75 (GAUZE/BANDAGES/DRESSINGS) ×3 IMPLANT
DURAPREP 26ML APPLICATOR (WOUND CARE) ×3 IMPLANT
ELECT BLADE TIP CTD 4 INCH (ELECTRODE) ×3 IMPLANT
ELECT REM PT RETURN 15FT ADLT (MISCELLANEOUS) ×3 IMPLANT
GLOVE BIOGEL PI IND STRL 7.5 (GLOVE) ×1 IMPLANT
GLOVE BIOGEL PI IND STRL 8.5 (GLOVE) ×1 IMPLANT
GLOVE BIOGEL PI IND STRL 9 (GLOVE) ×1 IMPLANT
GLOVE BIOGEL PI INDICATOR 7.5 (GLOVE) ×2
GLOVE BIOGEL PI INDICATOR 8.5 (GLOVE) ×2
GLOVE BIOGEL PI INDICATOR 9 (GLOVE) ×2
GLOVE ECLIPSE 8.5 STRL (GLOVE) IMPLANT
GLOVE ORTHO TXT STRL SZ7.5 (GLOVE) ×6 IMPLANT
GOWN STRL REUS W/TWL 2XL LVL3 (GOWN DISPOSABLE) ×3 IMPLANT
GOWN STRL REUS W/TWL LRG LVL3 (GOWN DISPOSABLE) ×3 IMPLANT
GOWN STRL REUS W/TWL XL LVL3 (GOWN DISPOSABLE) ×6 IMPLANT
HEAD FEM UNIPOLAR 49 OD STRL (Hips) ×3 IMPLANT
KIT BASIN OR (CUSTOM PROCEDURE TRAY) ×3 IMPLANT
MANIFOLD NEPTUNE II (INSTRUMENTS) ×3 IMPLANT
PACK TOTAL JOINT (CUSTOM PROCEDURE TRAY) ×3 IMPLANT
POSITIONER SURGICAL ARM (MISCELLANEOUS) ×3 IMPLANT
SPACER FEM TAPERED +5 12/14 (Hips) ×3 IMPLANT
STEM TRI LOC GRIPTION SZ 6 STD ×1 IMPLANT
SUT MNCRL AB 4-0 PS2 18 (SUTURE) ×3 IMPLANT
SUT STRATAFIX 0 PDS 27 VIOLET (SUTURE) ×3
SUT VIC AB 1 CT1 36 (SUTURE) ×3 IMPLANT
SUT VIC AB 2-0 CT1 27 (SUTURE) ×4
SUT VIC AB 2-0 CT1 TAPERPNT 27 (SUTURE) ×2 IMPLANT
SUTURE STRATFX 0 PDS 27 VIOLET (SUTURE) ×1 IMPLANT
TOWEL OR 17X26 10 PK STRL BLUE (TOWEL DISPOSABLE) ×3 IMPLANT
TRAY FOLEY MTR SLVR 16FR STAT (SET/KITS/TRAYS/PACK) ×3 IMPLANT
TRI LOC GRIPTION SZ 6 STD ×3 IMPLANT

## 2018-01-26 NOTE — ED Notes (Signed)
ED TO INPATIENT HANDOFF REPORT  Name/Age/Gender [name] 77 y.o. male  Code Status Code Status History    Date Active Date Inactive Code Status Order ID Comments User Context   01/21/2017 0349 01/22/2017 1619 Full Code [id number]  Etta Quill, DO ED   01/18/2015 1458 01/19/2015 1936 Full Code [id number]  Deboraha Sprang, MD Inpatient   07/29/2013 0008 08/02/2013 1840 Full Code [id number]  [name], MD Inpatient      Home/SNF/Other Home  Chief Complaint Fall  Level of Care/Admitting Diagnosis ED Disposition    ED Disposition Condition [place]: [facility] [100102]  Level of Care: Med-Surg [16]  Diagnosis: Closed left hip fracture, initial encounter [facility]) [[id number]]  Admitting Physician: [name] [[id number]]  Attending Physician: [name] [[id number]]  Estimated length of stay: past midnight tomorrow  Certification:: I certify this patient will need inpatient services for at least 2 midnights  PT Class (Do Not Modify): Inpatient [101]  PT Acc Code (Do Not Modify): Private [1]       Medical History Past Medical History:  Diagnosis Date  . AAA (abdominal aortic aneurysm) ([place])   . Blindness of left eye   . 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 CAD (coronary artery disease)    a. s/p CABG in 10/2008 with LIMA-LAD, SVG-OM1 with Y-graft to PL branch, and SVG-RCA  . Carotid stenosis   . Cerebrovascular disease   . CHF (congestive heart failure) ([place])   . Cirrhosis ([place])    on CT a/p 07/2013, no longer drinking  . CONGENITAL UNSPEC REDUCTION DEFORMITY LOWER LIMB   . Constipation due to opioid therapy 2014   began about a year ago  . COPD (chronic obstructive pulmonary disease) ([place])   . Foley catheter in place   . HTN (hypertension)   . Hyperlipidemia   . 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  Neuromuscular disorder ([place])    PT STATES HE HAS BEEN TOLD HE EITHER HAD POLIO OR CEREBRAL PALSY - STATES HIS LEFT LEG IS SHORTER AND AFFECTS HIS BALANCE - HE WEARS ELEVATED SHOE -LEFT  . Pain    BACK, LEGS AND ARMS  . Shortness of breath    EXERTION  . TOBACCO USE, QUIT     Allergies Allergies  Allergen Reactions  . Doxycycline   . Amlodipine Nausea And Vomiting and Other (See Comments)    dizziness  . Fish Allergy Nausea And Vomiting    STATES HE HAS NOT EATEN ANY FISH INCLUDING SHELLFISH FOR PAST 40 YRS - IT CAUSED NAUSEA  . Hydrocodone Itching and Rash  . Other Nausea And Vomiting    STATES HE HAS NOT EATEN ANY FISH INCLUDING SHELLFISH FOR PAST 40 YRS - IT CAUSED NAUSEA  . Tylenol [Acetaminophen] Other (See Comments)    Liver problems    IV Location/Drains/Wounds Patient Lines/Drains/Airways Status   Active Line/Drains/Airways    Name:   Placement date:   Placement time:   Site:   Days:   Peripheral IV 01/26/18 Left Antecubital   01/26/18    0108    Antecubital   less than 1   Urethral Catheter m browning Coude   07/28/13    1710    Coude   1643   Urethral Catheter Dr. Junious Silk Coude 18 Fr.   02/14/14    4239  Coude   1442          Labs/Imaging Results for orders placed or performed during the hospital encounter of 01/25/18 (from the past 48 hour(s))  CBC with Differential/Platelet     Status: Abnormal   Collection Time: 01/26/18  1:06 AM  Result Value Ref Range   WBC 9.7 4.0 - 10.5 K/uL   RBC 4.57 4.22 - 5.81 MIL/uL   Hemoglobin 13.1 13.0 - 17.0 g/dL   HCT 39.5 39.0 - 52.0 %   MCV 86.4 80.0 - 100.0 fL   MCH 28.7 26.0 - 34.0 pg   MCHC 33.2 30.0 - 36.0 g/dL   RDW 16.2 (H) 11.5 - 15.5 %   Platelets 245 150 - 400 K/uL   Neutrophils Relative % 80 %   Neutro Abs 7.7 1.7 - 7.7 K/uL   Lymphocytes Relative 7 %   Lymphs Abs 0.6 (L) 0.7 - 4.0 K/uL   Monocytes Relative 13 %   Monocytes Absolute 1.2 (H) 0.1 - 1.0 K/uL   Eosinophils Relative 0 %   Eosinophils  Absolute 0.0 0.0 - 0.5 K/uL   Basophils Relative 0 %   Basophils Absolute 0.0 0.0 - 0.1 K/uL    Comment: Performed at [facility], [place] [address], [place], [place] [address]  Comprehensive metabolic panel     Status: Abnormal   Collection Time: 01/26/18  1:06 AM  Result Value Ref Range   Sodium 139 135 - 145 mmol/L   Potassium 4.5 3.5 - 5.1 mmol/L   Chloride 101 98 - 111 mmol/L   CO2 25 22 - 32 mmol/L   Glucose, Bld 131 (H) 70 - 99 mg/dL   BUN 37 (H) 8 - 23 mg/dL   Creatinine, Ser 2.16 (H) 0.61 - 1.24 mg/dL   Calcium 9.3 8.9 - 10.3 mg/dL   Total Protein 7.8 6.5 - 8.1 g/dL   Albumin 4.3 3.5 - 5.0 g/dL   AST 40 15 - 41 U/L   ALT 27 0 - 44 U/L   Alkaline Phosphatase 82 38 - 126 U/L   Total Bilirubin 1.4 (H) 0.3 - 1.2 mg/dL   GFR calc non Af Amer 28 (L) >60 mL/min   GFR calc Af Amer 33 (L) >60 mL/min    Comment: (NOTE) The eGFR has been calculated using the CKD EPI equation. This calculation has not been validated in all clinical situations. eGFR's persistently <60 mL/min signify possible Chronic Kidney Disease.    Anion gap 13 5 - 15    Comment: Performed at [facility], [place] [address] [place],  [address]   Dg Chest Port 1 View  Result Date: 01/26/2018 CLINICAL DATA:  Preoperative for fractured hip. EXAM: PORTABLE CHEST 1 VIEW COMPARISON:  01/20/2017 FINDINGS: Postoperative changes in the mediastinum. Shallow inspiration with elevation of the left hemidiaphragm. Heart size and pulmonary vascularity are normal. No airspace disease or consolidation in the lungs. No blunting of costophrenic angles. No pneumothorax. Mediastinal contours appear intact. Calcification of the aorta. IMPRESSION: No active disease. Electronically Signed   By: Lucienne Capers M.D.   On: 01/26/2018 01:25   Dg Knee Complete 4 Views Right  Result Date: 01/26/2018 CLINICAL DATA:  Fall with knee pain EXAM: RIGHT KNEE - COMPLETE 4+ VIEW COMPARISON:  None. FINDINGS:  No evidence of fracture, dislocation, or joint effusion. No evidence of arthropathy or other focal bone abnormality. Soft tissues are unremarkable. IMPRESSION: Negative. Electronically Signed   By: Cletus Gash.D.  On: 01/26/2018 00:39   Dg Hip Unilat W Or W/o Pelvis 2-3 Views Left  Result Date: 01/26/2018 CLINICAL DATA:  Fall EXAM: DG HIP (WITH OR WITHOUT PELVIS) 2-3V LEFT COMPARISON:  None. FINDINGS: There is a impacted fracture of the left femoral neck with mild medial angulation. Femoral head remains situated in the acetabulum. IMPRESSION: Impacted fracture of the left femoral neck. Electronically Signed   By: Ulyses Jarred M.D.   On: 01/26/2018 00:39   None  Pending Labs Unresulted Labs (From admission, onward)   None      Vitals/Pain Today's Vitals   01/25/18 2257 01/25/18 2301 01/26/18 0136 01/26/18 0139  BP: (!) 146/79   (!) 162/80  Pulse: 65   70  Resp: 15   18  Temp: (!) 97.5 F (36.4 C)     TempSrc: Oral     SpO2: 96%   96%  Weight: 77.1 kg     Height: 5' 11"  (1.803 m)     PainSc:  10-Worst pain ever Asleep     Isolation Precautions No active isolations  Medications Medications  fentaNYL (SUBLIMAZE) injection 25 mcg (25 mcg Intramuscular Given 01/25/18 2338)  fentaNYL (SUBLIMAZE) injection 50 mcg (50 mcg Intravenous Given 01/26/18 0104)    Mobility non-ambulatory

## 2018-01-26 NOTE — Op Note (Signed)
NAME:  [name]                ACCOUNT NO.:  [id number]   MEDICAL RECORD NO.: [id number]   LOCATION:  7425                         FACILITY:  [facility]   DATE OF BIRTH:  03/28/2041  PHYSICIAN:  Pietro Cassis. Alvan Dame, M.D.     DATE OF PROCEDURE:  01/26/18                               OPERATIVE REPORT     PREOPERATIVE DIAGNOSIS:  Left displaced femoral neck fracture.   POSTOPERATIVE DIAGNOSIS:  Left displaced femoral neck fracture.   PROCEDURE:  Left hip hemiarthroplasty utilizing DePuy component, size 6 standard Tri-Lock stem with a 41mm unipolar ball with a +5 adapter.   SURGEON:  Pietro Cassis. Alvan Dame, MD   ASSISTANT:  Danae Orleans, PA-C.   ANESTHESIA:  General.   SPECIMENS:  None.   DRAINS:  One medium Hemovac.   BLOOD LOSS:  About 200 cc.   COMPLICATIONS:  None.   INDICATION OF PROCEDURE:  [name] is a 77 year old male who lives independently.  He unfortunately had a fall while at a convenience store tripping over something in parking lot.  He was admitted to the hospital after radiographs revealed a femoral neck fracture.  He was seen and evaluated and was scheduled for surgery for fixation.  The necessity of surgical repair was discussed with him.  Consent was obtained after reviewing risks of infection, DVT, component failure, and need for revision surgery.  Pros and cons of hemi versus total hip arthroplasty discussed.  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   PROCEDURE IN DETAIL:  The patient was brought to the operative theater. Once adequate anesthesia, preoperative antibiotics, 2 g of Ancef administered, the patient was positioned into the right lateral decubitus position with the left side up.  The left lower extremity was then prepped and draped in sterile fashion.  A time-out was performed identifying the patient, planned procedure, and extremity.   A lateral incision was made off the  proximal trochanter. Sharp dissection was carried down to the iliotibial band and gluteal fascia. The gluteal fascia was then incised for posterior approach.  The short external rotators were taken down separate from the posterior capsule. An L capsulotomy was made preserving the posterior leaflet for later anatomic repair. Fracture site was identified and after removing comminuted segments of the posterior femoral neck, the femoral head was removed without difficulty and measured on the back table  using the sizing rings and determined to be 49 mm in diameter.   The proximal femur was then exposed.  Retractors placed.  I then drilled, opened the proximal femur.  Then I hand reamed once and  Irrigated the canal to try to prevent fat emboli.  I began broaching the femur with a starting broach up to a size 6 broach with good medial and lateral metaphyseal fit without evidence of any torsion or movement.  A trial reduction was carried out with a standard neck and a +0 then +5 adapter with a 103mm ball.  The hip reduced nicely.  The leg lengths appeared to be equal compared to the down leg.   The hip went through a range of motion  without evidence of any subluxation or impingement.   Given these findings, the trial components removed.  The final 6 standard  Tri-Lock stem was opened.  After irrigating the canal, the final stem was impacted and sat at the level where the broach was. Based on this and the trial reduction, a +5 adapter was opened and impacted in the 7mm unipolar ball onto a clean and dry trunnion.  The hip had been irrigated throughout the case and again at this point.  I re- Approximated the posterior capsule to the superior leaflet using a  #1 Vicryl.  The remainder of the wound was closed with #1 Vicryl in the iliotibial band and gluteal fascia, a  2-0 Vicryl in the sub-Q tissue and a running 4-0 Monocryl in the skin.  The hip was cleaned, dried, and dressed sterilely using  Dermabond and Aquacel dressing.  He was then brought to recovery room, extubated in stable condition, tolerating the procedure well.  Danae Orleans, PA-C was present and utilized as Environmental consultant for the entire case from  Preoperative positioning to management of the contralateral extremity and retractors to  General facilitation of the procedure.  He was also involved with primary wound closure.         Pietro Cassis Alvan Dame, M.D.

## 2018-01-26 NOTE — Anesthesia Postprocedure Evaluation (Signed)
Anesthesia Post Note  Patient: [name]  Procedure(s) Performed: LEFT HIP POSTERIOR HEMIARTHROPLASTY (Left Hip)     Patient location during evaluation: PACU Anesthesia Type: General Level of consciousness: awake and alert Pain management: pain level controlled Vital Signs Assessment: post-procedure vital signs reviewed and stable Respiratory status: spontaneous breathing, nonlabored ventilation, respiratory function stable and patient connected to nasal cannula oxygen Cardiovascular status: blood pressure returned to baseline and stable Postop Assessment: no apparent nausea or vomiting Anesthetic complications: no    Last Vitals:  Vitals:   01/26/18 1624 01/26/18 1747  BP: (!) 113/55 (!) 117/41  Pulse: (!) 55 62  Resp:  14  Temp: 36.8 C 36.7 C  SpO2: 100% 100%    Last Pain:  Vitals:   01/26/18 1747  TempSrc: Oral  PainSc:                  Lidia Collum

## 2018-01-26 NOTE — Op Note (Signed)
Procedure: Cystoscopy with insertion of Foley catheter.  Preop diagnosis: Difficult Foley placement.  Postop diagnosis: Prostatic urethral stricture.  Surgeon: Dr. Irine Seal.  Anesthesia: General.  Drain: 48 [place] council Foley catheter.  Specimen: None.  EBL: None.  Complications: None.  Indications: [name] is a 77 year old white male who is to undergo a left ORIF for hip fracture and attempted Foley catheter placement was unsuccessful in the OR.  I was asked to see the patient for catheter placement.  On review of his history he had a greenlight prostatectomy done by Dr. Junious Silk in 2015.  Procedure: He was on the holding room stretcher anesthetized and did receive preoperative antibiotics from Dr. Alvan Dame.  I attempted to place the 18 [place] coud catheter after additional urethral lubrication but was unsuccessful.  The catheter seemed to make it to the prostate but not beyond.  The flexible cystoscope was then passed per urethra and he was found to have a normal urethra.  The prostatic urethra had evidence of prior resection but there was an elevated bladder neck and the prostatic urethra was somewhat snug but not obstructed.  There was a little blood clot in the prostatic urethra but an obvious false passage was not apparent.  The bladder was full and not well visualized.  A sensor guidewire was passed through the cystoscope into the bladder and the cystoscope was removed.  A 16 [place] council catheter was placed over the wire into the bladder without difficulty and the balloon was filled with 10 cc of sterile fluid.  The wire was removed and the catheter was placed to straight drainage.  There were no complications during my portion of procedure.  No trouble

## 2018-01-26 NOTE — Anesthesia Preprocedure Evaluation (Signed)
Anesthesia Evaluation  Patient identified by MRN, date of birth, ID band Patient awake    Reviewed: Allergy & Precautions, NPO status , Patient's Chart, lab work & pertinent test results  History of Anesthesia Complications Negative for: history of anesthetic complications  Airway Mallampati: IV  TM Distance: >3 FB Neck ROM: Full    Dental  (+) Edentulous Upper, Edentulous Lower   Pulmonary shortness of breath, COPD, former smoker,    Pulmonary exam normal        Cardiovascular hypertension, + CAD, + Past MI, + CABG and +CHF  Normal cardiovascular exam+ dysrhythmias (s/p ablation)      Neuro/Psych negative neurological ROS  negative psych ROS   GI/Hepatic negative GI ROS, (+) Cirrhosis       ,   Endo/Other  negative endocrine ROS  Renal/GU Renal InsufficiencyRenal disease  negative genitourinary   Musculoskeletal negative musculoskeletal ROS (+)   Abdominal   Peds  Hematology negative hematology ROS (+)   Anesthesia Other Findings   Reproductive/Obstetrics                             Anesthesia Physical Anesthesia Plan  ASA: III  Anesthesia Plan: General   Post-op Pain Management:    Induction: Intravenous  PONV Risk Score and Plan: 2 and Ondansetron, Dexamethasone and Treatment may vary due to age or medical condition  Airway Management Planned: Oral ETT  Additional Equipment: None  Intra-op Plan:   Post-operative Plan: Extubation in OR  Informed Consent: I have reviewed the patients History and Physical, chart, labs and discussed the procedure including the risks, benefits and alternatives for the proposed anesthesia with the patient or authorized representative who has indicated his/her understanding and acceptance.     Plan Discussed with:   Anesthesia Plan Comments:         Anesthesia Quick Evaluation

## 2018-01-26 NOTE — Consult Note (Signed)
Subjective: I was asked to see [name] in consultation by Dr. Alvan Dame for foley placement prior to a left ORIF.  He has a history of Greenlight laser prostatectomy in 2015 by Dr. Junious Silk and was being managed with a condom cath prior to being brought to the OR.  Attempts at straight and 17fr coude cath placement were unsuccessful.  ROS:  Review of Systems  Unable to perform ROS: Intubated    Allergies  Allergen Reactions  . Doxycycline   . Amlodipine Nausea And Vomiting and Other (See Comments)    dizziness  . Fish Allergy Nausea And Vomiting    STATES HE HAS NOT EATEN ANY FISH INCLUDING SHELLFISH FOR PAST 40 YRS - IT CAUSED NAUSEA  . Hydrocodone Itching and Rash  . Other Nausea And Vomiting    STATES HE HAS NOT EATEN ANY FISH INCLUDING SHELLFISH FOR PAST 40 YRS - IT CAUSED NAUSEA  . Tylenol [Acetaminophen] Other (See Comments)    Liver problems    Past Medical History:  Diagnosis Date  . AAA (abdominal aortic aneurysm) ([place])   . Blindness of left eye   . 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 CAD (coronary artery disease)    a. s/p CABG in 10/2008 with LIMA-LAD, SVG-OM1 with Y-graft to PL branch, and SVG-RCA  . Carotid stenosis   . Cerebrovascular disease   . CHF (congestive heart failure) ([place])   . Cirrhosis ([place])    on CT a/p 07/2013, no longer drinking  . CONGENITAL UNSPEC REDUCTION DEFORMITY LOWER LIMB   . Constipation due to opioid therapy 2014   began about a year ago  . COPD (chronic obstructive pulmonary disease) ([place])   . Foley catheter in place   . HTN (hypertension)   . Hyperlipidemia   . 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 Neuromuscular disorder ([place])    PT STATES HE HAS BEEN TOLD HE EITHER HAD POLIO OR CEREBRAL PALSY - STATES HIS LEFT LEG IS SHORTER AND AFFECTS HIS BALANCE - HE WEARS ELEVATED SHOE -LEFT  . Pain    BACK, LEGS AND ARMS  .  Shortness of breath    EXERTION  . TOBACCO USE, QUIT     Past Surgical History:  Procedure Laterality Date  . CORONARY ARTERY BYPASS GRAFT  11/03/2008   Ricard Dillon - x4: left internal mammary artery to the distal left anterior descending, saphemous vein graft to the first circumflex marginal branch with a Y graft sequentiallly to a left posterolateral branch, saphenous vein graft to the distal right coronary artery  . ELECTROPHYSIOLOGIC STUDY N/A 01/18/2015   Procedure: A-Flutter Ablation;  Surgeon: Deboraha Sprang, MD;  Location: [facility];  Service: Cardiovascular;  Laterality: N/A;  . GREEN LIGHT LASER TURP (TRANSURETHRAL RESECTION OF PROSTATE N/A 02/14/2014   Procedure: GREEN LIGHT LASER TURP (TRANSURETHRAL RESECTION OF PROSTATE;  Surgeon: Festus Aloe, MD;  Location: [facility];  Service: Urology;  Laterality: N/A;    Social History   Socioeconomic History  . Marital status: Widowed    Spouse name: Not on file  . Number of children: Not on file  . Years of education: Not on file  . Highest education level: Not on file  Occupational History  . Not on file  Social Needs  . Financial resource strain: Not on file  . Food insecurity:    Worry: Not on file    Inability:  Not on file  . Transportation needs:    Medical: Not on file    Non-medical: Not on file  Tobacco Use  . Smoking status: Former Research scientist (life sciences)  . Smokeless tobacco: Never Used  Substance and Sexual Activity  . Alcohol use: No    Alcohol/week: 0.0 standard drinks    Comment: History of heavy alcohol use per pt. Quit many years ago1/1/ 2005  . Drug use: No  . Sexual activity: Never  Lifestyle  . Physical activity:    Days per week: Not on file    Minutes per session: Not on file  . Stress: Not on file  Relationships  . Social connections:    Talks on phone: Not on file    Gets together: Not on file    Attends religious service: Not on file    Active member of club or organization: Not on file    Attends  meetings of clubs or organizations: Not on file    Relationship status: Not on file  . Intimate partner violence:    Fear of current or ex partner: Not on file    Emotionally abused: Not on file    Physically abused: Not on file    Forced sexual activity: Not on file  Other Topics Concern  . Not on file  Social History Narrative   lives alone here in [place].  He has 1 son, who lives in the [place], and he has     an uncle, who lives nearby as well.     Family History  Problem Relation Age of Onset  . Cirrhosis Mother        died at 69  . Colon cancer Neg Hx   . Stomach cancer Neg Hx     Anti-infectives: Anti-infectives (From admission, onward)   Start     Dose/Rate Route Frequency Ordered Stop   01/26/18 0915  ceFAZolin (ANCEF) IVPB 2g/100 mL premix     2 g 200 mL/hr over 30 Minutes Intravenous On call to O.R. 01/26/18 0907 01/26/18 1117      Current Facility-Administered Medications  Medication Dose Route Frequency Provider Last Rate Last Dose  . 0.9 %  sodium chloride infusion   Intravenous Continuous Opyd, Ilene Qua, MD 80 mL/hr at 01/26/18 0600    . [MAR Hold] albuterol (PROVENTIL) (2.5 MG/3ML) 0.083% nebulizer solution 2.5 mg  2.5 mg Nebulization Q6H PRN Opyd, Ilene Qua, MD      . chlorhexidine (HIBICLENS) 4 % liquid 4 application  60 mL Topical Once [name], MD      . dexamethasone (DECADRON) injection 8 mg  8 mg Intravenous 30 min Pre-Op [name], MD      . Doug Sou Hold] finasteride (PROSCAR) tablet 5 mg  5 mg Oral Daily Opyd, Ilene Qua, MD   Stopped at 01/26/18 0920  . [MAR Hold] guaiFENesin (MUCINEX) 12 hr tablet 1,200 mg  1,200 mg Oral BID PRN Opyd, Ilene Qua, MD      . Doug Sou Hold] hydrALAZINE (APRESOLINE) tablet 10 mg  10 mg Oral TID [name], MD   Stopped at 01/26/18 [phone]  . [MAR Hold] lactulose (CHRONULAC) 10 GM/15ML solution 20 g  20 g Oral BID PRN Opyd, Ilene Qua, MD      . Doug Sou Hold] morphine 2 MG/ML injection 1-3 mg  1-3 mg  Intravenous Q2H PRN Opyd, Ilene Qua, MD   3 mg at 01/26/18 0916  . [MAR Hold] pravastatin (PRAVACHOL) tablet 20 mg  20 mg  Oral q1800 [name], MD      . Doug Sou Hold] sertraline (ZOLOFT) tablet 25 mg  25 mg Oral Daily Opyd, Ilene Qua, MD   Stopped at 01/26/18 [phone]  . [MAR Hold] tamsulosin (FLOMAX) capsule 0.4 mg  0.4 mg Oral Daily Opyd, Ilene Qua, MD   Stopped at 01/26/18 0623   Facility-Administered Medications Ordered in Other Encounters  Medication Dose Route Frequency Provider Last Rate Last Dose  . dexamethasone (DECADRON) injection    Anesthesia Intra-op Lind Covert, CRNA   10 mg at 01/26/18 1125  . ePHEDrine sulfate (PF) 50mg  in normal saline 10 mL (5mg /mL) syringe   Intravenous Anesthesia Intra-op Lind Covert, CRNA   10 mg at 01/26/18 1142  . fentaNYL (SUBLIMAZE) injection    Anesthesia Intra-op Lind Covert, CRNA   25 mcg at 01/26/18 1116  . lactated ringers infusion    Continuous PRN Lind Covert, CRNA      . lidocaine 2% (20 mg/mL) 5 mL syringe   Intravenous Anesthesia Intra-op Lind Covert, CRNA   100 mg at 01/26/18 1116  . ondansetron (ZOFRAN) injection   Intravenous Anesthesia Intra-op Lind Covert, CRNA   4 mg at 01/26/18 1125  . propofol (DIPRIVAN) 10 mg/mL bolus/IV push    Anesthesia Intra-op Lind Covert, CRNA   150 mg at 01/26/18 1116  . rocuronium bromide 10 mg/mL (PF) syringe   Intravenous Anesthesia Intra-op Lind Covert, CRNA   40 mg at 01/26/18 1130  . succinylcholine (ANECTINE) syringe   Intravenous Anesthesia Intra-op Lind Covert, CRNA   120 mg at 01/26/18 1116     Objective: Vital signs in last 24 hours: Temp:  [97.5 F (36.4 C)-98.6 F (37 C)] 98.6 F (37 C) (10/08 0837) Pulse Rate:  [54-70] 54 (10/08 0837) Resp:  [15-20] 15 (10/08 0535) BP: (124-162)/(52-80) 124/63 (10/08 0837) SpO2:  [92 %-96 %] 92 % (10/08 0837) Weight:  [77.1 kg-80.6 kg] 80.6 kg (10/08 0500)  Intake/Output from previous day: 10/07 0701 - 10/08  0700 In: 267 [P.O.:60; I.V.:207] Out: 350 [Urine:350] Intake/Output this shift: No intake/output data recorded.   Physical Exam  Constitutional: He appears well-developed and well-nourished.  Abdominal: Soft. He exhibits no mass.  Genitourinary: Penis normal.  Genitourinary Comments: Scrotum and contents normal.     Lab Results:  Recent Labs    01/26/18 0106 01/26/18 0345  WBC 9.7 8.7  HGB 13.1 12.6*  HCT 39.5 38.9*  PLT 245 233   BMET Recent Labs    01/26/18 0106 01/26/18 0345  NA 139 140  K 4.5 4.3  CL 101 102  CO2 25 26  GLUCOSE 131* 130*  BUN 37* 38*  CREATININE 2.16* 2.04*  CALCIUM 9.3 9.1   PT/INR No results for input(s): LABPROT, INR in the last 72 hours. ABG No results for input(s): PHART, HCO3 in the last 72 hours.  Invalid input(s): PCO2, PO2  Studies/Results: Dg Chest Port 1 View  Result Date: 01/26/2018 CLINICAL DATA:  Preoperative for fractured hip. EXAM: PORTABLE CHEST 1 VIEW COMPARISON:  01/20/2017 FINDINGS: Postoperative changes in the mediastinum. Shallow inspiration with elevation of the left hemidiaphragm. Heart size and pulmonary vascularity are normal. No airspace disease or consolidation in the lungs. No blunting of costophrenic angles. No pneumothorax. Mediastinal contours appear intact. Calcification of the aorta. IMPRESSION: No active disease. Electronically Signed   By: Lucienne Capers M.D.   On: 01/26/2018 01:25   Dg Knee Complete 4 Views Right  Result Date: 01/26/2018 CLINICAL DATA:  Fall with knee pain EXAM: RIGHT KNEE - COMPLETE 4+ VIEW COMPARISON:  None. FINDINGS: No evidence of fracture, dislocation, or joint effusion. No evidence of arthropathy or other focal bone abnormality. Soft tissues are unremarkable. IMPRESSION: Negative. Electronically Signed   By: Ulyses Jarred M.D.   On: 01/26/2018 00:39   Dg Hip Unilat W Or W/o Pelvis 2-3 Views Left  Result Date: 01/26/2018 CLINICAL DATA:  Fall EXAM: DG HIP (WITH OR WITHOUT PELVIS)  2-3V LEFT COMPARISON:  None. FINDINGS: There is a impacted fracture of the left femoral neck with mild medial angulation. Femoral head remains situated in the acetabulum. IMPRESSION: Impacted fracture of the left femoral neck. Electronically Signed   By: Ulyses Jarred M.D.   On: 01/26/2018 00:39   I reviewed the patient's history in Epic.    Assessment: History of Greenlight laser prostatectomy in 2015 with difficult foley placement but no significant obstruction.   Should be able to d/c foley in 2-3 days.    CC: Dr. Adriana Mccallum     [name] 01/26/2018 [phone]

## 2018-01-26 NOTE — Transfer of Care (Signed)
Immediate Anesthesia Transfer of Care Note  Patient: [name]  Procedure(s) Performed: LEFT HIP POSTERIOR HEMIARTHROPLASTY (Left Hip)  Patient Location: PACU  Anesthesia Type:General  Level of Consciousness: sedated  Airway & Oxygen Therapy: Patient Spontanous Breathing and Patient connected to face mask oxygen  Post-op Assessment: Report given to RN and Post -op Vital signs reviewed and stable  Post vital signs: Reviewed and stable  Last Vitals:  Vitals Value Taken Time  BP    Temp    Pulse 59 01/26/2018  1:18 PM  Resp    SpO2 97 % 01/26/2018  1:18 PM  Vitals shown include unvalidated device data.  Last Pain:  Vitals:   01/26/18 0917  TempSrc:   PainSc: 10-Worst pain ever      Patients Stated Pain Goal: 4 (53/74/82 [id number])  Complications: No apparent anesthesia complications

## 2018-01-26 NOTE — Consult Note (Signed)
Reason for Consult: Left hip fracture Referring Physician: Hospitalist service, [place], MD  [name] is an 77 y.o. male.  HPI: [name] is a 77 y.o. male with medical history significant for COPD, coronary artery disease, chronic diastolic CHF, chronic pain, liver cirrhosis, and chronic kidney disease stage III, now presenting to the emergency department for evaluation of severe left hip pain after a fall.  Patient reports that he been in his usual state of health and was walking into a convenient store when he tripped over a wheelchair ramp, landed on his left side, and experienced immediate and severe pain at the left hip.  He denies hitting his head or losing consciousness.  He was unable to bear weight after this.  He denies any recent chest pain or palpitations and denies any significant shortness of breath or cough.  No recent fevers or chills.  Patient reports that he usually ambulates with a walker, but had not been using it today.  Presented to ER where work up revealed left femoral neck fracture.  Other injuries ruled out clinically and radiographically.  Orthopaedics consulted for definitive management.  No other complaints other than hip pain  Reviewed pre-op medical and functional history  Past Medical History:  Diagnosis Date  . AAA (abdominal aortic aneurysm) ([place])   . Blindness of left eye   . 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 CAD (coronary artery disease)    a. s/p CABG in 10/2008 with LIMA-LAD, SVG-OM1 with Y-graft to PL branch, and SVG-RCA  . Carotid stenosis   . Cerebrovascular disease   . CHF (congestive heart failure) ([place])   . Cirrhosis ([place])    on CT a/p 07/2013, no longer drinking  . CONGENITAL UNSPEC REDUCTION DEFORMITY LOWER LIMB   . Constipation due to opioid therapy 2014   began about a year ago  . COPD (chronic obstructive pulmonary disease) ([place])   . Foley catheter in place   . HTN (hypertension)   .  Hyperlipidemia   . 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 Neuromuscular disorder ([place])    PT STATES HE HAS BEEN TOLD HE EITHER HAD POLIO OR CEREBRAL PALSY - STATES HIS LEFT LEG IS SHORTER AND AFFECTS HIS BALANCE - HE WEARS ELEVATED SHOE -LEFT  . Pain    BACK, LEGS AND ARMS  . Shortness of breath    EXERTION  . TOBACCO USE, QUIT     Past Surgical History:  Procedure Laterality Date  . CORONARY ARTERY BYPASS GRAFT  11/03/2008   Ricard Dillon - x4: left internal mammary artery to the distal left anterior descending, saphemous vein graft to the first circumflex marginal branch with a Y graft sequentiallly to a left posterolateral branch, saphenous vein graft to the distal right coronary artery  . ELECTROPHYSIOLOGIC STUDY N/A 01/18/2015   Procedure: A-Flutter Ablation;  Surgeon: Deboraha Sprang, MD;  Location: [facility];  Service: Cardiovascular;  Laterality: N/A;  . GREEN LIGHT LASER TURP (TRANSURETHRAL RESECTION OF PROSTATE N/A 02/14/2014   Procedure: GREEN LIGHT LASER TURP (TRANSURETHRAL RESECTION OF PROSTATE;  Surgeon: Festus Aloe, MD;  Location: [facility];  Service: Urology;  Laterality: N/A;    Family History  Problem Relation Age of Onset  . Cirrhosis Mother        died at 1  . Colon cancer Neg Hx   . Stomach cancer Neg Hx  Social History:  reports that he has quit smoking. He has never used smokeless tobacco. He reports that he does not drink alcohol or use drugs.  Allergies:  Allergies  Allergen Reactions  . Doxycycline   . Amlodipine Nausea And Vomiting and Other (See Comments)    dizziness  . Fish Allergy Nausea And Vomiting    STATES HE HAS NOT EATEN ANY FISH INCLUDING SHELLFISH FOR PAST 40 YRS - IT CAUSED NAUSEA  . Hydrocodone Itching and Rash  . Other Nausea And Vomiting    STATES HE HAS NOT EATEN ANY FISH INCLUDING SHELLFISH FOR PAST 40 YRS - IT CAUSED NAUSEA  . Tylenol  [Acetaminophen] Other (See Comments)    Liver problems    Medications:  I have reviewed the patient's current medications. Scheduled: . finasteride  5 mg Oral Daily  . hydrALAZINE  10 mg Oral TID  . pravastatin  20 mg Oral q1800  . sertraline  25 mg Oral Daily  . tamsulosin  0.4 mg Oral Daily    Results for orders placed or performed during the hospital encounter of 01/25/18 (from the past 24 hour(s))  CBC with Differential/Platelet     Status: Abnormal   Collection Time: 01/26/18  1:06 AM  Result Value Ref Range   WBC 9.7 4.0 - 10.5 K/uL   RBC 4.57 4.22 - 5.81 MIL/uL   Hemoglobin 13.1 13.0 - 17.0 g/dL   HCT 39.5 39.0 - 52.0 %   MCV 86.4 80.0 - 100.0 fL   MCH 28.7 26.0 - 34.0 pg   MCHC 33.2 30.0 - 36.0 g/dL   RDW 16.2 (H) 11.5 - 15.5 %   Platelets 245 150 - 400 K/uL   Neutrophils Relative % 80 %   Neutro Abs 7.7 1.7 - 7.7 K/uL   Lymphocytes Relative 7 %   Lymphs Abs 0.6 (L) 0.7 - 4.0 K/uL   Monocytes Relative 13 %   Monocytes Absolute 1.2 (H) 0.1 - 1.0 K/uL   Eosinophils Relative 0 %   Eosinophils Absolute 0.0 0.0 - 0.5 K/uL   Basophils Relative 0 %   Basophils Absolute 0.0 0.0 - 0.1 K/uL  Comprehensive metabolic panel     Status: Abnormal   Collection Time: 01/26/18  1:06 AM  Result Value Ref Range   Sodium 139 135 - 145 mmol/L   Potassium 4.5 3.5 - 5.1 mmol/L   Chloride 101 98 - 111 mmol/L   CO2 25 22 - 32 mmol/L   Glucose, Bld 131 (H) 70 - 99 mg/dL   BUN 37 (H) 8 - 23 mg/dL   Creatinine, Ser 2.16 (H) 0.61 - 1.24 mg/dL   Calcium 9.3 8.9 - 10.3 mg/dL   Total Protein 7.8 6.5 - 8.1 g/dL   Albumin 4.3 3.5 - 5.0 g/dL   AST 40 15 - 41 U/L   ALT 27 0 - 44 U/L   Alkaline Phosphatase 82 38 - 126 U/L   Total Bilirubin 1.4 (H) 0.3 - 1.2 mg/dL   GFR calc non Af Amer 28 (L) >60 mL/min   GFR calc Af Amer 33 (L) >60 mL/min   Anion gap 13 5 - 15  Type and screen [place]     Status: None   Collection Time: 01/26/18  3:45 AM  Result Value Ref Range    ABO/RH(D) A NEG    Antibody Screen NEG    Sample Expiration      01/29/2018 Performed at [facility], [place]  [address], [place],  [address]   Basic metabolic panel     Status: Abnormal   Collection Time: 01/26/18  3:45 AM  Result Value Ref Range   Sodium 140 135 - 145 mmol/L   Potassium 4.3 3.5 - 5.1 mmol/L   Chloride 102 98 - 111 mmol/L   CO2 26 22 - 32 mmol/L   Glucose, Bld 130 (H) 70 - 99 mg/dL   BUN 38 (H) 8 - 23 mg/dL   Creatinine, Ser 2.04 (H) 0.61 - 1.24 mg/dL   Calcium 9.1 8.9 - 10.3 mg/dL   GFR calc non Af Amer 30 (L) >60 mL/min   GFR calc Af Amer 35 (L) >60 mL/min   Anion gap 12 5 - 15  CBC     Status: Abnormal   Collection Time: 01/26/18  3:45 AM  Result Value Ref Range   WBC 8.7 4.0 - 10.5 K/uL   RBC 4.51 4.22 - 5.81 MIL/uL   Hemoglobin 12.6 (L) 13.0 - 17.0 g/dL   HCT 38.9 (L) 39.0 - 52.0 %   MCV 86.3 80.0 - 100.0 fL   MCH 27.9 26.0 - 34.0 pg   MCHC 32.4 30.0 - 36.0 g/dL   RDW 16.1 (H) 11.5 - 15.5 %   Platelets 233 150 - 400 K/uL    X-ray: CLINICAL DATA:  Fall  EXAM: DG HIP (WITH OR WITHOUT PELVIS) 2-3V LEFT  COMPARISON:  None.  FINDINGS: There is a impacted fracture of the left femoral neck with mild medial angulation. Femoral head remains situated in the acetabulum.  IMPRESSION: Impacted fracture of the left femoral neck.   Electronically Signed   By: Ulyses Jarred M.D.  ROS  Per HPI  Blood pressure (!) 129/56, pulse 67, temperature 98.5 F (36.9 C), temperature source Oral, resp. rate 15, height 5\' 11"  (1.803 m), weight 77.1 kg, SpO2 93 %.  Physical Exam  Awake alert Left hip slightly flexed, anxious about moving it due to pain Slight adduction contracture  Assessment/Plan: Displaced left femoral neck fracture Cerebral Palsy  Plan: To OR today for left hip hemiarthroplasty due to medical co morbidities and to try and assist with stability of hip Risks reviewed including risk of dislocation associated  with his hemiplegic cerebral palsy  NPO Consent ordered  Mauri Pole 01/26/2018, 6:17 AM

## 2018-01-26 NOTE — ED Notes (Signed)
Pt unable to fully straighten out leg due to hx of cerebral palsy, therefore unable to place knee immobilizer.

## 2018-01-26 NOTE — H&P (Signed)
History and Physical    [name] TDD:[id number] DOB: 08/22/1940 DOA: 01/25/2018  PCP: [name], MD   Patient coming from: Home   Chief Complaint: Fall with left hip pain   HPI: [name] is a 77 y.o. male with medical history significant for COPD, coronary artery disease, chronic diastolic CHF, chronic pain, liver cirrhosis, and chronic kidney disease stage III, now presenting to the emergency department for evaluation of severe left hip pain after a fall.  Patient reports that he been in his usual state of health and was walking into a convenient store when he tripped over a wheelchair ramp, landed on his left side, and experienced immediate and severe pain at the left hip.  He denies hitting his head or losing consciousness.  He was unable to bear weight after this.  He denies any recent chest pain or palpitations and denies any significant shortness of breath or cough.  No recent fevers or chills.  Patient reports that he usually ambulates with a walker, but had not been using it today.  ED Course: Upon arrival to the ED, patient is found to be afebrile, saturating well on room air, and with vitals otherwise normal.  EKG features what is suspected to be second-degree AV block Mobitz type I.  Chest x-ray is negative for acute cardiopulmonary disease.  Radiographs reveal impacted fracture of the left femoral neck.  Chemistry panel is notable for BUN of 37 and creatinine of 2.16, up from 1.98 in March.  Total bilirubin is slightly elevated to 1.4.  CBC is unremarkable.  Orthopedic surgery was consulted by the ED physician and the patient was treated with fentanyl x2.  He remains hemodynamically stable and will be admitted for ongoing evaluation and management of the left hip fracture.  Review of Systems:  All other systems reviewed and apart from HPI, are negative.  Past Medical History:  Diagnosis Date  . AAA (abdominal aortic aneurysm) ([place])   . Blindness of left eye    . 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 CAD (coronary artery disease)    a. s/p CABG in 10/2008 with LIMA-LAD, SVG-OM1 with Y-graft to PL branch, and SVG-RCA  . Carotid stenosis   . Cerebrovascular disease   . CHF (congestive heart failure) ([place])   . Cirrhosis ([place])    on CT a/p 07/2013, no longer drinking  . CONGENITAL UNSPEC REDUCTION DEFORMITY LOWER LIMB   . Constipation due to opioid therapy 2014   began about a year ago  . COPD (chronic obstructive pulmonary disease) ([place])   . Foley catheter in place   . HTN (hypertension)   . Hyperlipidemia   . 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 Neuromuscular disorder ([place])    PT STATES HE HAS BEEN TOLD HE EITHER HAD POLIO OR CEREBRAL PALSY - STATES HIS LEFT LEG IS SHORTER AND AFFECTS HIS BALANCE - HE WEARS ELEVATED SHOE -LEFT  . Pain    BACK, LEGS AND ARMS  . Shortness of breath    EXERTION  . TOBACCO USE, QUIT     Past Surgical History:  Procedure Laterality Date  . CORONARY ARTERY BYPASS GRAFT  11/03/2008   Ricard Dillon - x4: left internal mammary artery to the distal left anterior descending, saphemous vein graft to the first circumflex marginal branch with a Y graft sequentiallly to a left posterolateral branch, saphenous vein graft to the  distal right coronary artery  . ELECTROPHYSIOLOGIC STUDY N/A 01/18/2015   Procedure: A-Flutter Ablation;  Surgeon: Deboraha Sprang, MD;  Location: [facility];  Service: Cardiovascular;  Laterality: N/A;  . GREEN LIGHT LASER TURP (TRANSURETHRAL RESECTION OF PROSTATE N/A 02/14/2014   Procedure: GREEN LIGHT LASER TURP (TRANSURETHRAL RESECTION OF PROSTATE;  Surgeon: Festus Aloe, MD;  Location: [facility];  Service: Urology;  Laterality: N/A;     reports that he has quit smoking. He has never used smokeless tobacco. He reports that he does not drink alcohol or use drugs.  Allergies    Allergen Reactions  . Doxycycline   . Amlodipine Nausea And Vomiting and Other (See Comments)    dizziness  . Fish Allergy Nausea And Vomiting    STATES HE HAS NOT EATEN ANY FISH INCLUDING SHELLFISH FOR PAST 40 YRS - IT CAUSED NAUSEA  . Hydrocodone Itching and Rash  . Other Nausea And Vomiting    STATES HE HAS NOT EATEN ANY FISH INCLUDING SHELLFISH FOR PAST 40 YRS - IT CAUSED NAUSEA  . Tylenol [Acetaminophen] Other (See Comments)    Liver problems    Family History  Problem Relation Age of Onset  . Cirrhosis Mother        died at 18  . Colon cancer Neg Hx   . Stomach cancer Neg Hx      Prior to Admission medications   Medication Sig Start Date End Date Taking? Authorizing Provider  aspirin EC 81 MG tablet Take 1 tablet (81 mg total) by mouth daily. 02/22/15   Deboraha Sprang, MD  BELBUCA 300 MCG FILM PLACE 1 FILM AGAINST THE INSIDE OF THE CHEEK HOLDING IN PLACE FOR 5 SECONDS 2 TIMES A DAY 09/11/17   [provider]  ciprofloxacin (CIPRO) 500 MG tablet Take 1 tablet (500 mg total) by mouth 2 (two) times daily. 09/16/17   Marrian Salvage, FNP  fluticasone [facility]) 50 MCG/ACT nasal spray PLACE 1 SPRAY INTO BOTH NOSTRILS EVERY MORNING. 12/07/17   [name], MD  furosemide (LASIX) 40 MG tablet Take 2 pills in the morning and 1 pill in the afternoon. 04/24/17   [name], MD  guaiFENesin (MUCINEX) 600 MG 12 hr tablet Take 1,200 mg by mouth 2 (two) times daily as needed for cough or to loosen phlegm.     [provider]  hydrALAZINE (APRESOLINE) 10 MG tablet TAKE 1 TABLET BY MOUTH 3 TIMES A DAY 08/17/17   Lelon Perla, MD  lactulose (CHRONULAC) 10 GM/15ML solution Take 30 mLs (20 g total) by mouth 2 (two) times daily as needed for mild constipation or moderate constipation. 10/13/17   [name], MD  lidocaine (LMX) 4 % cream Apply 1 application topically as needed.    [provider]  nystatin ointment (MYCOSTATIN) APPLY  TO AFFECTED AREA TWICE A DAY 04/01/17   [name], MD  Oxycodone HCl 20 MG TABS Take 1-2 tablets (20-40 mg total) by mouth every 4 (four) hours. 08/07/17   [name], MD  potassium chloride SA (KLOR-CON M20) 20 MEQ tablet Take 2 tablets (40 mEq total) by mouth every evening. KEEP OV. 12/23/17   Lelon Perla, MD  pravastatin (PRAVACHOL) 20 MG tablet TAKE 1 TABLET BY MOUTH EVERY DAY 01/12/18   [name], MD  predniSONE (DELTASONE) 20 MG tablet Take 2 tablets (40 mg total) by mouth daily with breakfast. 12/17/17   [name], MD  sertraline (ZOLOFT)  25 MG tablet TAKE 1 TABLET BY MOUTH EVERYDAY AT BEDTIME 06/05/17   [name], MD  spironolactone (ALDACTONE) 25 MG tablet TAKE 1 TABLET BY MOUTH EVERY DAY 01/08/18   [name], MD  tamsulosin (FLOMAX) 0.4 MG CAPS capsule Take 1 capsule (0.4 mg total) by mouth daily. 09/16/17   Marrian Salvage, FNP  triamcinolone cream (KENALOG) 0.1 % APPLY TO AFFECTED AREA TWICE A DAY 03/25/17   [name], MD  VENTOLIN HFA 108 [phone] Base) MCG/ACT inhaler TAKE 2 PUFFS BY MOUTH EVERY 6 HOURS AS NEEDED FOR WHEEZE 12/01/17   [name], MD    Physical Exam: Vitals:   01/25/18 2257 01/26/18 0139 01/26/18 0200  BP: (!) 146/79 (!) 162/80 (!) 125/52  Pulse: 65 70 70  Resp: 15 18 17   Temp: (!) 97.5 F (36.4 C)    TempSrc: Oral    SpO2: 96% 96% 94%  Weight: 77.1 kg    Height: 5\' 11"  (1.803 m)       Constitutional: NAD, calm  Eyes: PERTLA, lids and conjunctivae normal ENMT: Mucous membranes are moist. Posterior pharynx clear of any exudate or lesions.   Neck: normal, supple, no masses, no thyromegaly Respiratory: Breath sounds mildly diminished bialterally, no wheezing, no crackles. Normal respiratory effort.    Cardiovascular: S1 & S2 heard, regular rate and rhythm. No extremity edema.   Abdomen: No distension, no tenderness, soft. Bowel sounds normal.  Musculoskeletal: no  clubbing / cyanosis. Left hip exquisitely tender, neurovascularly intact distally.  Skin: no significant rashes, lesions, ulcers. Warm, dry, well-perfused. Neurologic: CN 2-12 grossly intact. Sensation intact. Strength 5/5 in all 4 limbs.  Psychiatric:  Alert and oriented x 3. Pleasant, cooperative.    Labs on Admission: I have personally reviewed following labs and imaging studies  CBC: Recent Labs  Lab 01/26/18 0106  WBC 9.7  NEUTROABS 7.7  HGB 13.1  HCT 39.5  MCV 86.4  PLT 295   Basic Metabolic Panel: Recent Labs  Lab 01/26/18 0106  NA 139  K 4.5  CL 101  CO2 25  GLUCOSE 131*  BUN 37*  CREATININE 2.16*  CALCIUM 9.3   GFR: Estimated Creatinine Clearance: 31.5 mL/min (A) (by C-G formula based on SCr of 2.16 mg/dL (H)). Liver Function Tests: Recent Labs  Lab 01/26/18 0106  AST 40  ALT 27  ALKPHOS 82  BILITOT 1.4*  PROT 7.8  ALBUMIN 4.3   No results for input(s): LIPASE, AMYLASE in the last 168 hours. No results for input(s): AMMONIA in the last 168 hours. Coagulation Profile: No results for input(s): INR, PROTIME in the last 168 hours. Cardiac Enzymes: No results for input(s): CKTOTAL, CKMB, CKMBINDEX, TROPONINI in the last 168 hours. BNP (last 3 results) Recent Labs    05/18/17 1641  PROBNP 622.0*   HbA1C: No results for input(s): HGBA1C in the last 72 hours. CBG: No results for input(s): GLUCAP in the last 168 hours. Lipid Profile: No results for input(s): CHOL, HDL, LDLCALC, TRIG, CHOLHDL, LDLDIRECT in the last 72 hours. Thyroid Function Tests: No results for input(s): TSH, T4TOTAL, FREET4, T3FREE, THYROIDAB in the last 72 hours. Anemia Panel: No results for input(s): VITAMINB12, FOLATE, FERRITIN, TIBC, IRON, RETICCTPCT in the last 72 hours. Urine analysis:    Component Value Date/Time   COLORURINE YELLOW 01/21/2017 0450   APPEARANCEUR CLEAR 01/21/2017 0450   LABSPEC 1.011 01/21/2017 0450   PHURINE 5.0 01/21/2017 0450   GLUCOSEU NEGATIVE  01/21/2017 0450   HGBUR NEGATIVE  01/21/2017 0450   BILIRUBINUR 3+ 09/16/2017 1617   KETONESUR NEGATIVE 01/21/2017 0450   PROTEINUR Positive (A) 09/16/2017 1617   PROTEINUR NEGATIVE 01/21/2017 0450   UROBILINOGEN 2.0 (A) 09/16/2017 1617   UROBILINOGEN 1.0 07/28/2013 2005   NITRITE pos 09/16/2017 1617   NITRITE NEGATIVE 01/21/2017 0450   LEUKOCYTESUR Large (3+) (A) 09/16/2017 1617   Sepsis Labs: @LABRCNTIP (procalcitonin:4,lacticidven:4) )No results found for this or any previous visit (from the past 240 hour(s)).   Radiological Exams on Admission: Dg Chest Port 1 View  Result Date: 01/26/2018 CLINICAL DATA:  Preoperative for fractured hip. EXAM: PORTABLE CHEST 1 VIEW COMPARISON:  01/20/2017 FINDINGS: Postoperative changes in the mediastinum. Shallow inspiration with elevation of the left hemidiaphragm. Heart size and pulmonary vascularity are normal. No airspace disease or consolidation in the lungs. No blunting of costophrenic angles. No pneumothorax. Mediastinal contours appear intact. Calcification of the aorta. IMPRESSION: No active disease. Electronically Signed   By: Lucienne Capers M.D.   On: 01/26/2018 01:25   Dg Knee Complete 4 Views Right  Result Date: 01/26/2018 CLINICAL DATA:  Fall with knee pain EXAM: RIGHT KNEE - COMPLETE 4+ VIEW COMPARISON:  None. FINDINGS: No evidence of fracture, dislocation, or joint effusion. No evidence of arthropathy or other focal bone abnormality. Soft tissues are unremarkable. IMPRESSION: Negative. Electronically Signed   By: Ulyses Jarred M.D.   On: 01/26/2018 00:39   Dg Hip Unilat W Or W/o Pelvis 2-3 Views Left  Result Date: 01/26/2018 CLINICAL DATA:  Fall EXAM: DG HIP (WITH OR WITHOUT PELVIS) 2-3V LEFT COMPARISON:  None. FINDINGS: There is a impacted fracture of the left femoral neck with mild medial angulation. Femoral head remains situated in the acetabulum. IMPRESSION: Impacted fracture of the left femoral neck. Electronically Signed   By:  Ulyses Jarred M.D.   On: 01/26/2018 00:39    EKG: Independently reviewed. Suspected second degree AV block Mobitz type I.   Assessment/Plan  1. Left hip fracture  - Presents with left hip pain following a fall  - Radiographs confirm impacted left femoral neck fracture  - Orthopedic surgery is consulting and much appreciated  - Based on the available data, [name] presents an estimated 1.4% risk for perioperative MI or cardiac arrest per Melburn Hake al  - Keep NPO, hold ASA, provide gentle IVF hydration, continue pain-control   2. CKD stage III  - SCr is 2.16 on admission, slightly up from most recent prior in March  - He appears euvolemic, will hold diuretics and provide gentle IVF hydration while NPO, renally-dose medications    3. COPD  - No SOB or wheezing  - Continue as-needed albuterol nebs   4. CAD  - Status-post CABG in 2010  - Denies any recent angina  - Hold ASA in anticipation of surgery, continue statin    5. Cirrhosis  - Appears compensated  - Denies any EtOH in ~15 yrs  - Diuretics held while NPO, providing a gentle IVF hydration   6. Chronic diastolic CHF  - Appears compensated  - Hold diuretics and provide a gentle IVF hydration while NPO  - Follow daily wt and I/O's   7. AAA  - Asymptomatic and measurements remained stable on [place] from August    8. Hypertension  - Continue hydralazine as tolerated    DVT prophylaxis: SCD's  Code Status: Full  Family Communication: Discussed with patient  Consults called: Orthopedic surgery  Admission status: Inpatient     [name], MD [facility]  Pager [phone]  If 7PM-7AM, please contact night-coverage [email or web address] Password [email or web address]  01/26/2018, 2:33 AM

## 2018-01-26 NOTE — Progress Notes (Signed)
[facility] PROGRESS NOTE  DENNEY SHEIN VOZ:[id number] DOB: [birth year] DOA: 01/25/2018 PCP: [name], MD  Brief summary   77 y.o. male with medical history significant for COPD, coronary artery disease s/p cabg, a flutter s/p ablation, chronic diastolic CHF, chronic pain, liver cirrhosis, and chronic kidney disease stage III, now presenting to the emergency department for evaluation of severe left hip pain after a fall.  Patient reports that he been in his usual state of health and was walking into a convenient store when he tripped over a wheelchair ramp, landed on his left side, and experienced immediate and severe pain at the left hip.  He denies hitting his head or losing consciousness.  He was unable to bear weight after this.  He denies any recent chest pain or palpitations and denies any significant shortness of breath or cough.  No recent fevers or chills.  Patient reports that he usually ambulates with a walker, but had not been using it today.  ED Course: Radiographs reveal impacted fracture of the left femoral neck. admitted for ongoing evaluation and management of the left hip fracture.  Assessment/Plan:  Fall mechanical. Left hip fracture. Radiographs confirm impacted left femoral neck fracture. Awaiting orthopedic surgery eval. NPO, pain control, iv fluids. Monitor   CKD stage III. SCr is 2.16 on admission, probable close to baseline. appears euvolemic. Gentle iv fluids, hold lasix preop  COPD. No s/s of exacerbation. Cont home regimen  CAD history of CABG in 2010. No acute cardiopulmonary symptoms. Resume home regimen  h/o a flutter s/p ablation (2016). H/o av block. monitor on tele post op  Cirrhosis. Chronic. appears compensated. Denies any EtOH in ~15 yrs. Diuretics held while NPO, providing a gentle IVF hydration  Chronic diastolic CHF. Appears compensated. Resume diuretics, home regimen post op.  AA. Asymptomatic and measurements remained stable on [place]  from August   Hypertension. Continue hydralazine as tolerated    Code Status: full Family Communication: d/w patient, RN (indicate person spoken with, relationship, and if by phone, the number) Disposition Plan: pend surgery, pt/ot    Consultants:  orhto   Procedures:  Hip surgery   Antibiotics:  none (indicate start date, and stop date if known)  HPI/Subjective: Alert. No distress. Reports hip pain. awaiting surgery   Objective: Vitals:   01/26/18 0535 01/26/18 0837  BP: (!) 129/56 124/63  Pulse: 67 (!) 54  Resp: 15   Temp: 98.5 F (36.9 C) 98.6 F (37 C)  SpO2: 93% 92%    Intake/Output Summary (Last 24 hours) at 01/26/2018 0942 Last data filed at 01/26/2018 0908 Gross per 24 hour  Intake 267.02 ml  Output 350 ml  Net -82.98 ml   Filed Weights   01/25/18 2257 01/26/18 0500  Weight: 77.1 kg 80.6 kg    Exam:   General:  No distress   Cardiovascular: s1,s2 rrr  Respiratory: CTA BL   Abdomen: soft, nt   Musculoskeletal: no pedal edema    Data Reviewed: Basic Metabolic Panel: Recent Labs  Lab 01/26/18 0106 01/26/18 0345  NA 139 140  K 4.5 4.3  CL 101 102  CO2 25 26  GLUCOSE 131* 130*  BUN 37* 38*  CREATININE 2.16* 2.04*  CALCIUM 9.3 9.1   Liver Function Tests: Recent Labs  Lab 01/26/18 0106  AST 40  ALT 27  ALKPHOS 82  BILITOT 1.4*  PROT 7.8  ALBUMIN 4.3   No results for input(s): LIPASE, AMYLASE in the last 168 hours. No results  for input(s): AMMONIA in the last 168 hours. CBC: Recent Labs  Lab 01/26/18 0106 01/26/18 0345  WBC 9.7 8.7  NEUTROABS 7.7  --   HGB 13.1 12.6*  HCT 39.5 38.9*  MCV 86.4 86.3  PLT 245 233   Cardiac Enzymes: No results for input(s): CKTOTAL, CKMB, CKMBINDEX, TROPONINI in the last 168 hours. BNP (last 3 results) No results for input(s): BNP in the last 8760 hours.  ProBNP (last 3 results) Recent Labs    05/18/17 1641  PROBNP 622.0*    CBG: No results for input(s): GLUCAP in the last 168  hours.  No results found for this or any previous visit (from the past 240 hour(s)).   Studies: Dg Chest Port 1 View  Result Date: 01/26/2018 CLINICAL DATA:  Preoperative for fractured hip. EXAM: PORTABLE CHEST 1 VIEW COMPARISON:  01/20/2017 FINDINGS: Postoperative changes in the mediastinum. Shallow inspiration with elevation of the left hemidiaphragm. Heart size and pulmonary vascularity are normal. No airspace disease or consolidation in the lungs. No blunting of costophrenic angles. No pneumothorax. Mediastinal contours appear intact. Calcification of the aorta. IMPRESSION: No active disease. Electronically Signed   By: Lucienne Capers M.D.   On: 01/26/2018 01:25   Dg Knee Complete 4 Views Right  Result Date: 01/26/2018 CLINICAL DATA:  Fall with knee pain EXAM: RIGHT KNEE - COMPLETE 4+ VIEW COMPARISON:  None. FINDINGS: No evidence of fracture, dislocation, or joint effusion. No evidence of arthropathy or other focal bone abnormality. Soft tissues are unremarkable. IMPRESSION: Negative. Electronically Signed   By: Ulyses Jarred M.D.   On: 01/26/2018 00:39   Dg Hip Unilat W Or W/o Pelvis 2-3 Views Left  Result Date: 01/26/2018 CLINICAL DATA:  Fall EXAM: DG HIP (WITH OR WITHOUT PELVIS) 2-3V LEFT COMPARISON:  None. FINDINGS: There is a impacted fracture of the left femoral neck with mild medial angulation. Femoral head remains situated in the acetabulum. IMPRESSION: Impacted fracture of the left femoral neck. Electronically Signed   By: Ulyses Jarred M.D.   On: 01/26/2018 00:39    Scheduled Meds: . chlorhexidine  60 mL Topical Once  . dexamethasone  8 mg Intravenous 30 min Pre-Op  . [MAR Hold] finasteride  5 mg Oral Daily  . [MAR Hold] hydrALAZINE  10 mg Oral TID  . [MAR Hold] pravastatin  20 mg Oral q1800  . [MAR Hold] sertraline  25 mg Oral Daily  . [MAR Hold] tamsulosin  0.4 mg Oral Daily   Continuous Infusions: . sodium chloride 80 mL/hr at 01/26/18 0600  .  ceFAZolin (ANCEF) IV     . tranexamic acid      Principal Problem:   Closed left hip fracture, initial encounter [facility]) Active Problems:   Coronary atherosclerosis   COPD (chronic obstructive pulmonary disease) (HCC)   Chronic back pain   Cirrhosis (HCC)   Second degree AV block, Mobitz type I   Chronic diastolic heart failure (HCC)   CKD (chronic kidney disease) stage 3, GFR 30-59 ml/min (HCC)    Time spent: >35 minutes     Kinnie Feil  [facility] Pager [phone]. If 7PM-7AM, please contact night-coverage at [email or web address], password [facility] 01/26/2018, 9:42 AM  LOS: 0 days

## 2018-01-26 NOTE — Anesthesia Procedure Notes (Signed)
Procedure Name: Intubation Date/Time: 01/26/2018 11:16 AM Performed by: Lind Covert, CRNA Pre-anesthesia Checklist: Patient identified, Emergency Drugs available, Suction available and Patient being monitored Patient Re-evaluated:Patient Re-evaluated prior to induction Oxygen Delivery Method: Circle system utilized Preoxygenation: Pre-oxygenation with 100% oxygen Induction Type: IV induction Laryngoscope Size: Mac and 4 Grade View: Grade I Tube type: Oral Tube size: 7.5 mm Number of attempts: 1 Airway Equipment and Method: Stylet Placement Confirmation: ETT inserted through vocal cords under direct vision,  breath sounds checked- equal and bilateral and positive ETCO2 Secured at: 22 cm Tube secured with: Tape Dental Injury: Teeth and Oropharynx as per pre-operative assessment

## 2018-01-27 ENCOUNTER — Encounter (HOSPITAL_COMMUNITY): Payer: Self-pay | Admitting: Orthopedic Surgery

## 2018-01-27 DIAGNOSIS — M549 Dorsalgia, unspecified: Secondary | ICD-10-CM

## 2018-01-27 DIAGNOSIS — G8929 Other chronic pain: Secondary | ICD-10-CM

## 2018-01-27 LAB — BASIC METABOLIC PANEL WITH GFR
Anion gap: 9 (ref 5–15)
BUN: 37 mg/dL — ABNORMAL HIGH (ref 8–23)
CO2: 28 mmol/L (ref 22–32)
Calcium: 8.4 mg/dL — ABNORMAL LOW (ref 8.9–10.3)
Chloride: 104 mmol/L (ref 98–111)
Creatinine, Ser: 1.81 mg/dL — ABNORMAL HIGH (ref 0.61–1.24)
GFR calc Af Amer: 40 mL/min — ABNORMAL LOW
GFR calc non Af Amer: 35 mL/min — ABNORMAL LOW
Glucose, Bld: 123 mg/dL — ABNORMAL HIGH (ref 70–99)
Potassium: 5.1 mmol/L (ref 3.5–5.1)
Sodium: 141 mmol/L (ref 135–145)

## 2018-01-27 LAB — CBC
HCT: 34.8 % — ABNORMAL LOW (ref 39.0–52.0)
Hemoglobin: 10.9 g/dL — ABNORMAL LOW (ref 13.0–17.0)
MCH: 27.6 pg (ref 26.0–34.0)
MCHC: 31.3 g/dL (ref 30.0–36.0)
MCV: 88.1 fL (ref 80.0–100.0)
Platelets: 198 10*3/uL (ref 150–400)
RBC: 3.95 MIL/uL — ABNORMAL LOW (ref 4.22–5.81)
RDW: 15.7 % — ABNORMAL HIGH (ref 11.5–15.5)
WBC: 11.3 10*3/uL — ABNORMAL HIGH (ref 4.0–10.5)
nRBC: 0 % (ref 0.0–0.2)

## 2018-01-27 MED ORDER — METHOCARBAMOL 500 MG PO TABS: 500.0000 mg | ORAL_TABLET | Freq: Four times a day (QID) | ORAL | 0 refills | Status: DC | PRN

## 2018-01-27 MED ORDER — ASPIRIN 81 MG PO CHEW: 81.0000 mg | CHEWABLE_TABLET | Freq: Two times a day (BID) | ORAL | 0 refills | Status: AC

## 2018-01-27 MED ORDER — FERROUS SULFATE 325 (65 FE) MG PO TABS: 325.0000 mg | ORAL_TABLET | Freq: Three times a day (TID) | ORAL | 3 refills | Status: DC

## 2018-01-27 MED ORDER — DOCUSATE SODIUM 100 MG PO CAPS: 100.0000 mg | ORAL_CAPSULE | Freq: Two times a day (BID) | ORAL | 0 refills | Status: DC

## 2018-01-27 MED ORDER — ENSURE ENLIVE PO LIQD
237.0000 mL | Freq: Two times a day (BID) | ORAL | Status: DC
  Administered 2018-01-27 – 2018-01-29 (×4): 237 mL via ORAL

## 2018-01-27 MED ORDER — PROSIGHT PO TABS
1.0000 | ORAL_TABLET | Freq: Every day | ORAL | Status: DC
  Administered 2018-01-27 – 2018-01-29 (×3): 1 via ORAL
  Filled 2018-01-27 (×3): qty 1

## 2018-01-27 MED ORDER — POLYETHYLENE GLYCOL 3350 17 G PO PACK: 17.0000 g | PACK | Freq: Two times a day (BID) | ORAL | 0 refills | Status: DC

## 2018-01-27 NOTE — Progress Notes (Signed)
Rehab Admissions Coordinator Note:  Patient was screened by Cleatrice Burke for appropriateness for an Inpatient Acute Rehab Consult per PT and OT recommendations.  At this time, we are recommending [place] or [facility] pending his progress with therapy. His overall co morbidities are chronic, therefore pt will not meet the medical neccesity guidelines for inpt hospital rehab at this time. Please call me with any questions.   Danne Baxter, RN, MSN Rehab Admissions Coordinator [phone] 01/27/2018 10:32 AM

## 2018-01-27 NOTE — Progress Notes (Signed)
Initial Nutrition Assessment  DOCUMENTATION CODES:   Not applicable  INTERVENTION:  Ensure Enlive BID: to provide 350kcal, 20g protein per serving Magic Cup BID 290kcal, 9g protein per serving   NUTRITION DIAGNOSIS:   Increased nutrient needs related to hip fracture, post-op healing as evidenced by estimated needs, mild fat depletion, mild muscle depletion.  GOAL:   Patient will meet greater than or equal to 90% of their needs   MONITOR:   Supplement acceptance, Weight trends, PO intake  REASON FOR ASSESSMENT:   Malnutrition Screening Tool, Consult Hip fracture protocol  ASSESSMENT:  77yo patient ED to hospital admit 10/7 for impacted left femoral neck fracture. Pt uses a walker for gait instability d/t reported chronic pain. PMH: AAA, benign prostatic hypertrophy, transurethral resection of prostate CABG, CHF, COPD, Cirrhosis  Delightful and welcoming pt sitting in chair at visit. 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 Endorses good appetite and 100% of breakfast (sausage, eggs, pots, toast w/jelly, fruit cup)  Pt reports a UBW of 218lb 77yr ago experiencing wt  loss after a reaction to doxycycline that caused n/v/d x 3-4 weeks. Pt drinks Equate brand ONS daily and endorses 3 meals/day 5days a week (when caregiver is present) Encourage pt to continue w/ ONS at home and open to EE and MC during stay   Medications: Colace, Ferrous sulfate tablet, oxyCodone  Labs: Glucose 126 (H), BUN 37 (H), Cr 1.81 (H), Ca 8.4 (L) - w/out albumin level for correction, GFR 40 (L)  LDA: 16Fr. Urethral catheter Extubated: 10/8 I/O: 288mL NUTRITION - FOCUSED PHYSICAL EXAM:    Most Recent Value  Orbital Region  Moderate depletion  Upper Arm Region  Mild depletion  Thoracic and Lumbar Region  No depletion  Buccal Region  Mild depletion  Temple Region  Moderate depletion  Clavicle Bone Region  Mild depletion  Clavicle and Acromion Bone  Region  No depletion  Scapular Bone Region  No depletion  Dorsal Hand  Mild depletion  Patellar Region  Mild depletion  Anterior Thigh Region  Mild depletion  Posterior Calf Region  Mild depletion  Edema (RD Assessment)  None  Hair  Reviewed  Eyes  Reviewed  Mouth  Reviewed  Skin  Reviewed  Nails  Reviewed       Diet Order:  100% x 1 recorded meal Diet Order            Diet regular Room service appropriate? Yes; Fluid consistency: Thin  Diet effective now              EDUCATION NEEDS:   No education needs have been identified at this time  Skin:  Skin Assessment: Reviewed RN Assessment(incision; left hip abraison; left arm, knee)  Last BM:  10/6  Height:   Ht Readings from Last 1 Encounters:  01/25/18 5\' 11"  (1.803 m)    Weight:   Wt Readings from Last 1 Encounters:  01/27/18 78.6 kg    Ideal Body Weight:  78.2 kg  BMI:  Body mass index is 24.17 kg/m.  Estimated Nutritional Needs:   Kcal:  1610-9604  Protein:  102-118 grams  Fluid:  2L    Lajuan Lines, RD, LDN  After Hours/Weekend Pager: [phone]

## 2018-01-27 NOTE — Evaluation (Signed)
Physical Therapy Evaluation Patient Details Name: [name] MRN: [id number] DOB: 27-May-1940 Today's Date: 01/27/2018   History of Present Illness  &77 yo male tripped and fell out in community on 01/25/18 , sustained  displaced femoral neck fracture on left. Post  posterior hemiarthroplasy 01/27/16. patient has h/p CABG, COPD, CHF, liver cirrhosis, unspecified neurological deficits of left extremities. Per patient report: diagnosed with Cerebral palsy. Has lift in left shoe for LLD.  Clinical Impression  Patient presents with increased tone of the left leg during mobility. Patient reports that he was tole  That he  Had CP or polio. Patient  Has L leg length discrepency PTA, uses shoe lift. Patient will be home alone at times. Patient can benefit from CIR to increase functional independence and safety. Pt admitted with above diagnosis. Pt currently with functional limitations due to the deficits listed below (see PT Problem List).  Pt will benefit from skilled PT to increase their independence and safety with mobility to allow discharge to the venue listed below.       Follow Up Recommendations CIR    Equipment Recommendations  Rolling walker with 5" wheels    Recommendations for Other Services Rehab consult     Precautions / Restrictions Precautions Precautions: Fall;Posterior Hip Precaution Booklet Issued: Yes (comment) Precaution Comments: handout provided Restrictions LLE Weight Bearing: Weight bearing as tolerated      Mobility  Bed Mobility Overal bed mobility: Needs Assistance Bed Mobility: Supine to Sit     Supine to sit: Mod assist     General bed mobility comments: cues for hip precaustions, use of bed rail and HOB raised. Extra time.  Transfers Overall transfer level: Needs assistance Equipment used: Rolling walker (2 wheeled) Transfers: Sit to/from Stand Sit to Stand: Mod assist;+2 physical assistance;+2 safety/equipment         General transfer  comment: cues for both foot position and hand position. +2 mod assist to rise from bed, both knees and hips  tending to stay flexed with no weight on LLE initially. Patient had to consciously place left foot onto floor. Cues for hand placement and hip precautions to sit down.  Ambulation/Gait Ambulation/Gait assistance: Mod assist;+2 physical assistance;+2 safety/equipment Gait Distance (Feet): 20 Feet Assistive device: Rolling walker (2 wheeled) Gait Pattern/deviations: Step-to pattern;Decreased step length - left;Decreased stance time - left;Trunk flexed;Staggering right Gait velocity: decreased   General Gait Details: + 2 mod steady support initially  for balance until able to increase weight on left leg. Left leg tending to buckle initially. Patient used  sneakers with heel lift for LLD present PTA. Multimodal cues for safety and sequence and step length and RW position  Stairs            Wheelchair Mobility    Modified Rankin (Stroke Patients Only)       Balance Overall balance assessment: Needs assistance;History of Falls Sitting-balance support: Feet supported;Bilateral upper extremity supported Sitting balance-Leahy Scale: Fair     Standing balance support: Bilateral upper extremity supported;During functional activity Standing balance-Leahy Scale: Poor Standing balance comment: relies on UEs for support.                             Pertinent Vitals/Pain Pain Assessment: Faces Faces Pain Scale: Hurts little more Pain Location: left hip Pain Descriptors / Indicators: Discomfort Pain Intervention(s): Monitored during session;Premedicated before session    Home Living Family/patient expects to be discharged to:: Private residence Living  Arrangements: Alone Available Help at Discharge: Available PRN/intermittently;Family Type of Home: Mobile home Home Access: Stairs to enter Entrance Stairs-Rails: Can reach both Entrance Stairs-Number of Steps:  3 Home Layout: One level Home Equipment: Walker - 4 wheels Additional Comments: unsure if he still has a 3:1 commode. He has a walk in shower at home.  Has a long shoehorn and adapted shoes    Prior Function Level of Independence: Independent with assistive device(s)         Comments: reports rollator will not fit through bedroom doors so holds onto objects//walls at that point. limited community ambulation, does drive.      Hand Dominance   Dominant Hand: Right    Extremity/Trunk Assessment   Upper Extremity Assessment Upper Extremity Assessment: Generalized weakness    Lower Extremity Assessment Lower Extremity Assessment: LLE deficits/detail LLE Deficits / Details: noted increased tone- noted knee and hip flexed in supine and while sitting. Also tendency for adduction of left hip.Increased tone LLE during ambulation with barely weight on left forefoot, improved with distance.    Cervical / Trunk Assessment Cervical / Trunk Assessment: Kyphotic  Communication   Communication: No difficulties  Cognition Arousal/Alertness: Awake/alert Behavior During Therapy: WFL for tasks assessed/performed;Impulsive Overall Cognitive Status: Within Functional Limits for tasks assessed                                 General Comments: pt very talkative; needs reinforcement with posterior THPs      General Comments      Exercises     Assessment/Plan    PT Assessment Patient needs continued PT services  PT Problem List Decreased strength;Decreased range of motion;Decreased activity tolerance;Decreased knowledge of use of DME;Decreased balance;Decreased safety awareness;Decreased mobility;Decreased knowledge of precautions;Pain       PT Treatment Interventions DME instruction;Gait training;Stair training;Functional mobility training;Therapeutic activities;Patient/family education;Therapeutic exercise    PT Goals (Current goals can be found in the Care Plan section)   Acute Rehab PT Goals Patient Stated Goal: home PT Goal Formulation: With patient Time For Goal Achievement: 02/10/18 Potential to Achieve Goals: Good    Frequency Min 5X/week   Barriers to discharge Decreased caregiver support      Co-evaluation PT/OT/SLP Co-Evaluation/Treatment: Yes Reason for Co-Treatment: Complexity of the patient's impairments (multi-system involvement);For patient/therapist safety PT goals addressed during session: Mobility/safety with mobility OT goals addressed during session: ADL's and self-care       AM-PAC PT "6 Clicks" Daily Activity  Outcome Measure Difficulty turning over in bed (including adjusting bedclothes, sheets and blankets)?: A Lot Difficulty moving from lying on back to sitting on the side of the bed? : A Lot Difficulty sitting down on and standing up from a chair with arms (e.g., wheelchair, bedside commode, etc,.)?: A Lot Help needed moving to and from a bed to chair (including a wheelchair)?: Total Help needed walking in hospital room?: Total Help needed climbing 3-5 steps with a railing? : Total 6 Click Score: 9    End of Session Equipment Utilized During Treatment: Gait belt Activity Tolerance: Patient limited by fatigue Patient left: in chair;with call bell/phone within reach;with chair alarm set Nurse Communication: Mobility status PT Visit Diagnosis: Unsteadiness on feet (R26.81);History of falling (Z91.81);Pain Pain - Right/Left: Left Pain - part of body: Hip    Time: 4680-3212 PT Time Calculation (min) (ACUTE ONLY): 37 min   Charges:   PT Evaluation $PT Eval Moderate Complexity: 1 Mod  [place] Pager [phone] Office [phone]   Claretha Cooper 01/27/2018, 10:01 AM

## 2018-01-27 NOTE — Progress Notes (Signed)
PROGRESS NOTE    [name]  GXQ:[id number] DOB: [birth year] DOA: 01/25/2018 PCP: [name], MD    Brief Narrative: 77 y.o.malewith medical history significant forCOPD, coronary artery disease s/p cabg, a flutter s/p ablation, chronic diastolic CHF, chronic pain, liver cirrhosis, and chronic kidney disease stage III, now presenting to the emergency department for evaluation of severe left hip pain after a fall. Patient reports that he been in his usual state of health and was walking into a convenient store when he tripped over a wheelchair ramp, landed on his left side, and experienced immediate and severe pain at the left hip. Radiographs reveal impacted fracture of the left femoral neck. admitted for ongoing evaluation and management of the left hip fracture. He underwent left hemiarthroplasty Assessment & Plan:   Principal Problem:   Closed left hip fracture, initial encounter [facility]) Active Problems:   Coronary atherosclerosis   COPD (chronic obstructive pulmonary disease) (HCC)   Chronic back pain   Cirrhosis (HCC)   Second degree AV block, Mobitz type I   Chronic diastolic heart failure (HCC)   CKD (chronic kidney disease) stage 3, GFR 30-59 ml/min (HCC)  Impacted fracture of the left femoral neck Status post left hip arthroplasty by Dr. Alvan Dame. Pain control and physical therapy.  [name] reports pain is not well controlled with oral every 4 hours.  He wants to try IV morphine today.    History of chronic back pain Resume home medications.   Of chronic diastolic heart failure He appears compensated.   Stage III CKD  Creatinine appears to be back to baseline today.   COPD No wheezing heard.  Resume inhalers as needed.   History of liver cirrhosis He appears to be compensated.  Resume diuretics on discharge.   History of coronary artery disease status post CABG in 2010 Patient denies any chest pain or shortness of breath at this time.   DVT  prophylaxis: SCDs Code Status: Full code Family Communication: none at bedside.  Disposition Plan: pending ortho clearance. Possible d/c in the next 24 hours.   Consultants:   ORTHOPEDICS.   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    Procedures:  S/p left hemiarthroplasty of the hip   Antimicrobials: none.    Subjective: Pt reports his pain not well controlled , and he is unable to wait for 4 hours.   Objective: Vitals:   01/27/18 0205 01/27/18 0500 01/27/18 0500 01/27/18 0900  BP: 122/69  133/66 (!) 127/52  Pulse: 92  (!) 53 (!) 51  Resp: 16   15  Temp: (!) 97.5 F (36.4 C)  98 F (36.7 C) 98.1 F (36.7 C)  TempSrc: Oral  Oral Oral  SpO2: 100%  100% 99%  Weight:  78.6 kg    Height:        Intake/Output Summary (Last 24 hours) at 01/27/2018 1319 Last data filed at 01/27/2018 0906 Gross per 24 hour  Intake 2006.21 ml  Output 1200 ml  Net 806.21 ml   Filed Weights   01/25/18 2257 01/26/18 0500 01/27/18 0500  Weight: 77.1 kg 80.6 kg 78.6 kg    Examination:  General exam: Appears calm and comfortable , sitting in the chair.  Respiratory system: Clear to auscultation. Respiratory effort normal. Cardiovascular system: S1 & S2 heard, RRR. No JVD,. No pedal edema. Gastrointestinal system: Abdomen is nondistended, soft and nontender. . Normal bowel sounds heard. Central nervous system: Alert and oriented. No focal neurological deficits. Extremities: painful ROM  of the left lower extremity.  Skin: No rashes, lesions or ulcers Psychiatry: . Mood & affect appropriate.     Data Reviewed: I have personally reviewed following labs and imaging studies  CBC: Recent Labs  Lab 01/26/18 0106 01/26/18 0345 01/27/18 0604  WBC 9.7 8.7 11.3*  NEUTROABS 7.7  --   --   HGB 13.1 12.6* 10.9*  HCT 39.5 38.9* 34.8*  MCV 86.4 86.3 88.1  PLT 245 233 426   Basic Metabolic Panel: Recent Labs  Lab 01/26/18 0106 01/26/18 0345 01/27/18 0604    NA 139 140 141  K 4.5 4.3 5.1  CL 101 102 104  CO2 25 26 28   GLUCOSE 131* 130* 123*  BUN 37* 38* 37*  CREATININE 2.16* 2.04* 1.81*  CALCIUM 9.3 9.1 8.4*   GFR: Estimated Creatinine Clearance: 37.6 mL/min (A) (by C-G formula based on SCr of 1.81 mg/dL (H)). Liver Function Tests: Recent Labs  Lab 01/26/18 0106  AST 40  ALT 27  ALKPHOS 82  BILITOT 1.4*  PROT 7.8  ALBUMIN 4.3   No results for input(s): LIPASE, AMYLASE in the last 168 hours. No results for input(s): AMMONIA in the last 168 hours. Coagulation Profile: No results for input(s): INR, PROTIME in the last 168 hours. Cardiac Enzymes: No results for input(s): CKTOTAL, CKMB, CKMBINDEX, TROPONINI in the last 168 hours. BNP (last 3 results) Recent Labs    05/18/17 1641  PROBNP 622.0*   HbA1C: No results for input(s): HGBA1C in the last 72 hours. CBG: No results for input(s): GLUCAP in the last 168 hours. Lipid Profile: No results for input(s): CHOL, HDL, LDLCALC, TRIG, CHOLHDL, LDLDIRECT in the last 72 hours. Thyroid Function Tests: No results for input(s): TSH, T4TOTAL, FREET4, T3FREE, THYROIDAB in the last 72 hours. Anemia Panel: No results for input(s): VITAMINB12, FOLATE, FERRITIN, TIBC, IRON, RETICCTPCT in the last 72 hours. Sepsis Labs: No results for input(s): PROCALCITON, LATICACIDVEN in the last 168 hours.  No results found for this or any previous visit (from the past 240 hour(s)).       Radiology Studies: Pelvis Portable  Result Date: 01/26/2018 CLINICAL DATA:  Status post left hip hemiarthroplasty. EXAM: PORTABLE PELVIS 1-2 VIEWS COMPARISON:  Radiographs of June 22, 2017. FINDINGS: Left hemiarthroplasty appears to be well situated. Expected postoperative changes are noted in the surrounding soft tissues. No fracture or dislocation is noted. IMPRESSION: Status post left hip hemiarthroplasty. Electronically Signed   By: Marijo Conception, M.D.   On: 01/26/2018 14:11   Dg Chest Port 1 View  Result  Date: 01/26/2018 CLINICAL DATA:  Preoperative for fractured hip. EXAM: PORTABLE CHEST 1 VIEW COMPARISON:  01/20/2017 FINDINGS: Postoperative changes in the mediastinum. Shallow inspiration with elevation of the left hemidiaphragm. Heart size and pulmonary vascularity are normal. No airspace disease or consolidation in the lungs. No blunting of costophrenic angles. No pneumothorax. Mediastinal contours appear intact. Calcification of the aorta. IMPRESSION: No active disease. Electronically Signed   By: Lucienne Capers M.D.   On: 01/26/2018 01:25   Dg Knee Complete 4 Views Right  Result Date: 01/26/2018 CLINICAL DATA:  Fall with knee pain EXAM: RIGHT KNEE - COMPLETE 4+ VIEW COMPARISON:  None. FINDINGS: No evidence of fracture, dislocation, or joint effusion. No evidence of arthropathy or other focal bone abnormality. Soft tissues are unremarkable. IMPRESSION: Negative. Electronically Signed   By: Ulyses Jarred M.D.   On: 01/26/2018 00:39   Dg Hip Unilat W Or W/o Pelvis 2-3 Views Left  Result Date:  01/26/2018 CLINICAL DATA:  Fall EXAM: DG HIP (WITH OR WITHOUT PELVIS) 2-3V LEFT COMPARISON:  None. FINDINGS: There is a impacted fracture of the left femoral neck with mild medial angulation. Femoral head remains situated in the acetabulum. IMPRESSION: Impacted fracture of the left femoral neck. Electronically Signed   By: Ulyses Jarred M.D.   On: 01/26/2018 00:39        Scheduled Meds: . aspirin EC  325 mg Oral BID  . docusate sodium  100 mg Oral BID  . ferrous sulfate  325 mg Oral TID PC  . finasteride  5 mg Oral Daily  . hydrALAZINE  10 mg Oral TID  . oxyCODONE  30 mg Oral Q4H  . pravastatin  20 mg Oral q1800  . sertraline  25 mg Oral Daily  . tamsulosin  0.4 mg Oral Daily   Continuous Infusions: . sodium chloride 75 mL/hr at 01/27/18 0600     LOS: 1 day    Time spent: 34 minutes.     Hosie Poisson, MD [facility] Pager [phone]  If 7PM-7AM, please contact  night-coverage [email or web address] Password [email or web address] 01/27/2018, 1:19 PM

## 2018-01-27 NOTE — Plan of Care (Signed)

## 2018-01-27 NOTE — Progress Notes (Signed)
CSW consult-SNF  Physical Therapy recommended CIR. CIR decline because the patient does not meet the medical necessity guidelines for inpatient rehab therefore CIR recommends SNF or HH.    CSW met with the patient at bedside to discuss the discharge plan. Patient declines SNF rehab and has arranged to go home. Patient states he will have a caregiver assist during the day time and his son will stay with him during the evening.  Patient request time to think about whether he wants Home Health services in the home.  Plan: Home CSW will sign off for now.   Kathrin Greathouse, Marlinda Mike, MSW Clinical Social Worker  [phone] 01/27/2018  2:31 PM

## 2018-01-27 NOTE — Evaluation (Signed)
Occupational Therapy Evaluation Patient Details Name: [name] MRN: [id number] DOB: 06-19-1940 Today's Date: 01/27/2018    History of Present Illness &77 yo male tripped and fell out in community on 01/25/18 , sustained  displaced femoral neck fracture on left. Post  posterior hemiarthroplasy 01/27/16. patient has h/p CABG, COPD, CHF, liver cirrhosis, unspecified neurological deficits of left extremities. Per patient report: diagnosed with Cerebral palsy. Has lift in left shoe for LLD.   Clinical Impression   Pt was admitted for the above.  He is mod I at baseline with ADLs and lives alone. He needs mod +2 for sit to stand during transfers and ADLs at this time.  He also needs reinforcement with posterior THPS. Will follow in acute setting with min A level goals.  Pt has a history of neuromuscular condition and would benefit from the intensity of CIR.     Follow Up Recommendations  CIR    Equipment Recommendations  3 in 1 bedside commode(if he doesn't still have his)    Recommendations for Other Services       Precautions / Restrictions Precautions Precautions: Fall;Posterior Hip Precaution Booklet Issued: Yes (comment) Precaution Comments: handout provided Restrictions LLE Weight Bearing: Weight bearing as tolerated      Mobility Bed Mobility Overal bed mobility: Needs Assistance Bed Mobility: Supine to Sit     Supine to sit: Mod assist     General bed mobility comments: cues for hip precaustions, use of bed rail and HOB raised. Extra time.  Transfers Overall transfer level: Needs assistance Equipment used: Rolling walker (2 wheeled) Transfers: Sit to/from Stand Sit to Stand: Mod assist;+2 physical assistance;+2 safety/equipment         General transfer comment: cues for both foot position and hand position. @ mod assist to rise from bed, both knees and hips  tending to stay flexed with no weight on LLE initially. Patient had to consciously place left foot  onto floor.    Balance Overall balance assessment: Needs assistance;History of Falls Sitting-balance support: Feet supported;Bilateral upper extremity supported Sitting balance-Leahy Scale: Fair     Standing balance support: Bilateral upper extremity supported;During functional activity Standing balance-Leahy Scale: Poor Standing balance comment: relies on UEs for support.                           ADL either performed or assessed with clinical judgement   ADL Overall ADL's : Needs assistance/impaired Eating/Feeding: Independent   Grooming: Set up   Upper Body Bathing: Set up   Lower Body Bathing: Moderate assistance;+2 for physical assistance;Sit to/from stand;+2 for safety/equipment   Upper Body Dressing : Set up   Lower Body Dressing: Total assistance;+2 for physical assistance;+2 for safety/equipment;Sit to/from stand   Toilet Transfer: Moderate assistance;+2 for safety/equipment;+2 for physical assistance;RW(built up chair)   Toileting- Clothing Manipulation and Hygiene: Moderate assistance;+2 for physical assistance;+2 for safety/equipment;Sit to/from stand         General ADL Comments: educated on hip precautions:  needs reinforcement     Vision         Perception     Praxis      Pertinent Vitals/Pain Pain Assessment: Faces Faces Pain Scale: Hurts little more Pain Location: left hip Pain Descriptors / Indicators: Discomfort Pain Intervention(s): Monitored during session;Premedicated before session     Hand Dominance Right   Extremity/Trunk Assessment Upper Extremity Assessment Upper Extremity Assessment: Generalized weakness   Lower Extremity Assessment Lower Extremity Assessment: LLE  deficits/detail LLE Deficits / Details: noted increased tone- noted knee and hip flexed in supine and while sitting. Also tendency for adduction of left hip.Increased tone LLE during ambulation with barely weight on left forefoot, improved with distance.    Cervical / Trunk Assessment Cervical / Trunk Assessment: Kyphotic   Communication Communication Communication: No difficulties   Cognition Arousal/Alertness: Awake/alert Behavior During Therapy: WFL for tasks assessed/performed;Impulsive Overall Cognitive Status: Within Functional Limits for tasks assessed                                 General Comments: pt very talkative; needs reinforcement with posterior THPs   General Comments       Exercises     Shoulder Instructions      Home Living Family/patient expects to be discharged to:: Private residence Living Arrangements: Alone Available Help at Discharge: Available PRN/intermittently;Family Type of Home: Mobile home Home Access: Stairs to enter Entrance Stairs-Number of Steps: 3 Entrance Stairs-Rails: Can reach both Home Layout: One level     Bathroom Shower/Tub: Walk-in shower         Home Equipment: Walker - 4 wheels   Additional Comments: unsure if he still has a 3:1 commode. He has a walk in shower at home.  Has a long shoehorn and adapted shoes      Prior Functioning/Environment Level of Independence: Independent with assistive device(s)        Comments: reports rollator will not fit through bedroom doors so holds onto objects//walls at that point. limited community ambulation, does drive.         OT Problem List: Decreased strength;Decreased activity tolerance;Impaired balance (sitting and/or standing);Decreased knowledge of use of DME or AE;Decreased knowledge of precautions;Pain      OT Treatment/Interventions: Self-care/ADL training;Energy conservation;DME and/or AE instruction;Patient/family education;Balance training;Therapeutic activities    OT Goals(Current goals can be found in the care plan section) Acute Rehab OT Goals Patient Stated Goal: home OT Goal Formulation: With patient Time For Goal Achievement: 02/10/18 Potential to Achieve Goals: Good ADL Goals Pt Will Perform  Grooming: with min guard assist;standing Pt Will Perform Lower Body Bathing: with min assist;with adaptive equipment;sit to/from stand Pt Will Perform Lower Body Dressing: with min assist;sit to/from stand;with adaptive equipment Pt Will Transfer to Toilet: with min assist;bedside commode;ambulating Pt Will Perform Toileting - Clothing Manipulation and hygiene: with min assist;sit to/from stand Additional ADL Goal #1: pt will perform bed mobility at min guard level in preparation for adls Additional ADL Goal #2: pt will follow thps (posterior) during adls/bathroom transfers without cues  OT Frequency: Min 2X/week   Barriers to D/C:            Co-evaluation   Reason for Co-Treatment: Complexity of the patient's impairments (multi-system involvement);For patient/therapist safety PT goals addressed during session: Mobility/safety with mobility OT goals addressed during session: ADL's and self-care      AM-PAC PT "6 Clicks" Daily Activity     Outcome Measure Help from another person eating meals?: None Help from another person taking care of personal grooming?: A Little Help from another person toileting, which includes using toliet, bedpan, or urinal?: A Lot Help from another person bathing (including washing, rinsing, drying)?: A Lot Help from another person to put on and taking off regular upper body clothing?: A Little Help from another person to put on and taking off regular lower body clothing?: Total 6 Click Score: 15   End  of Session Nurse Communication: Mobility status;Precautions(+2)  Activity Tolerance: Patient limited by fatigue Patient left: in chair;with call bell/phone within reach;with chair alarm set  OT Visit Diagnosis: Muscle weakness (generalized) (M62.81);Pain Pain - Right/Left: Left Pain - part of body: Hip                Time: 4132-4401 OT Time Calculation (min): 25 min Charges:  OT General Charges $OT Visit: 1 Visit OT Evaluation $OT Eval Moderate  Complexity: [place], OTR/L Acute Rehabilitation Services [phone] [facility] pager [phone] office 01/27/2018  Taelynn Mcelhannon 01/27/2018, 9:58 AM

## 2018-01-27 NOTE — Progress Notes (Signed)
     Subjective: 1 Day Post-Op Procedure(s) (LRB): LEFT HIP POSTERIOR HEMIARTHROPLASTY (Left)   Patient reports pain as mild, pain controlled for the most part.  No events throughout the night. States that he would prefer to be discharged home if able, when ready.    Objective:   VITALS:   Vitals:   01/27/18 0205 01/27/18 0500  BP: 122/69 133/66  Pulse: 92 (!) 53  Resp: 16   Temp: (!) 97.5 F (36.4 C) 98 F (36.7 C)  SpO2: 100% 100%    Dorsiflexion/Plantar flexion intact Incision: dressing C/D/I No cellulitis present Compartment soft  LABS Recent Labs    01/26/18 0106 01/26/18 0345 01/27/18 0604  HGB 13.1 12.6* 10.9*  HCT 39.5 38.9* 34.8*  WBC 9.7 8.7 11.3*  PLT 245 233 198    Recent Labs    01/26/18 0106 01/26/18 0345 01/27/18 0604  NA 139 140 141  K 4.5 4.3 5.1  BUN 37* 38* 37*  CREATININE 2.16* 2.04* 1.81*  GLUCOSE 131* 130* 123*     Assessment/Plan: 1 Day Post-Op Procedure(s) (LRB): LEFT HIP POSTERIOR HEMIARTHROPLASTY (Left) Advance diet Up with therapy D/C IV fluids Discharge home favorable per patient when ready   Ortho recommendations:  ASA 81 mg bid for 4 weeks for anticoagulation, unless other medically indicated.  Oxycodone is given from pain management.  MiraLax and Colace for constipation  Iron 325 mg tid for 2-3 weeks   WBAT on the left leg  Dressing to remain in place until follow in clinic in 2 weeks.  Dressing is waterproof and may shower with it in place.  Follow up in 2 weeks at [facility]. Follow up with OLIN,Verdella Laidlaw D in 2 weeks.  Contact information:  [facility] [address], [address] [place] [name]   PAC  01/27/2018, 8:43 AM

## 2018-01-28 LAB — BASIC METABOLIC PANEL
Anion gap: 9 (ref 5–15)
BUN: 37 mg/dL — ABNORMAL HIGH (ref 8–23)
CO2: 25 mmol/L (ref 22–32)
Calcium: 8.4 mg/dL — ABNORMAL LOW (ref 8.9–10.3)
Chloride: 107 mmol/L (ref 98–111)
Creatinine, Ser: 1.3 mg/dL — ABNORMAL HIGH (ref 0.61–1.24)
GFR calc Af Amer: >60 mL/min (ref >60)
GFR calc non Af Amer: 52 mL/min — ABNORMAL LOW (ref >60)
Glucose, Bld: 114 mg/dL — ABNORMAL HIGH (ref 70–99)
Potassium: 4.1 mmol/L (ref 3.5–5.1)
Sodium: 141 mmol/L (ref 135–145)

## 2018-01-28 LAB — CBC
HCT: 32.4 % — ABNORMAL LOW (ref 39.0–52.0)
Hemoglobin: 9.9 g/dL — ABNORMAL LOW (ref 13.0–17.0)
MCH: 27.4 pg (ref 26.0–34.0)
MCHC: 30.6 g/dL (ref 30.0–36.0)
MCV: 89.8 fL (ref 80.0–100.0)
Platelets: 173 10*3/uL (ref 150–400)
RBC: 3.61 MIL/uL — ABNORMAL LOW (ref 4.22–5.81)
RDW: 15.8 % — ABNORMAL HIGH (ref 11.5–15.5)
WBC: 8 10*3/uL (ref 4.0–10.5)
nRBC: 0 % (ref 0.0–0.2)

## 2018-01-28 MED ORDER — ENSURE ENLIVE PO LIQD: 237.0000 mL | Freq: Two times a day (BID) | ORAL | 12 refills | Status: DC

## 2018-01-28 MED ORDER — PROSIGHT PO TABS: 1.0000 | ORAL_TABLET | Freq: Every day | ORAL | 0 refills | Status: DC

## 2018-01-28 NOTE — Progress Notes (Signed)
Occupational Therapy Treatment Patient Details Name: [name] MRN: [id number] DOB: Apr 22, 1940 Today's Date: 01/28/2018    History of present illness &77 yo male tripped and fell out in community on 01/25/18 , sustained  displaced femoral neck fracture on left. Post  posterior hemiarthroplasy 01/27/16. patient has h/p CABG, COPD, CHF, liver cirrhosis, unspecified neurological deficits of left extremities. Per patient report: diagnosed with Cerebral palsy. Has lift in left shoe for LLD.   OT comments  Performed 2 SPT with +1 assist; need +2 for ambulating due to pt being unsteady. Pt states he won't be moving much:  Educated that he still needs quite a bit of help to stand.  Recommended SNF to regain strength/balance prior to home, but he is not agreeable to SNF at this time.  Better understands posterior precautions, but cues needed during functional activities   Follow Up Recommendations  Home health OT(recommend SNF which pt is refusing; needs A for mobility/adl)    Equipment Recommendations       Recommendations for Other Services      Precautions / Restrictions Precautions Precautions: Fall;Posterior Hip Restrictions LLE Weight Bearing: Weight bearing as tolerated       Mobility Bed Mobility               General bed mobility comments: pt was sitting EOB  Transfers   Equipment used: Rolling walker (2 wheeled)   Sit to Stand: Min assist;Mod assist         General transfer comment: Mod A from bed and min mod A from both recliner and 3;1 commode. Cues for sequence, posterior precautions and safety    Balance                                           ADL either performed or assessed with clinical judgement   ADL                       Lower Body Dressing: Maximal assistance;Sit to/from stand;With adaptive equipment   Toilet Transfer: Minimal assistance;Moderate assistance;Stand-pivot;BSC;RW             General ADL  Comments: Used reacher for pants; pt has a sock aide at home. total A for shoes with shoehorn. Performed 2 SPTs. Pt still shaky and needs cues for safety/sequence with SPT.  Would not feel safe ambulating without second person     Vision       Perception     Praxis      Cognition Arousal/Alertness: Awake/alert Behavior During Therapy: [facility] for tasks assessed/performed;Impulsive                                   General Comments: pt better understands precautions but needs cues during functional activities. Cues for safety        Exercises     Shoulder Instructions       General Comments      Pertinent Vitals/ Pain       Pain Assessment: Faces Faces Pain Scale: Hurts little more Pain Location: left hip Pain Descriptors / Indicators: Discomfort Pain Intervention(s): Limited activity within patient's tolerance;Monitored during session;Premedicated before session;Repositioned;Ice applied  Home Living  Prior Functioning/Environment              Frequency  Min 2X/week        Progress Toward Goals  OT Goals(current goals can now be found in the care plan section)  Progress towards OT goals: Progressing toward goals     Plan      Co-evaluation                 AM-PAC PT "6 Clicks" Daily Activity     Outcome Measure   Help from another person eating meals?: None Help from another person taking care of personal grooming?: A Little Help from another person toileting, which includes using toliet, bedpan, or urinal?: A Lot Help from another person bathing (including washing, rinsing, drying)?: A Lot Help from another person to put on and taking off regular upper body clothing?: A Little Help from another person to put on and taking off regular lower body clothing?: A Lot 6 Click Score: 16    End of Session    OT Visit Diagnosis: Muscle weakness (generalized) (M62.81);Pain Pain  - Right/Left: Left Pain - part of body: Hip   Activity Tolerance     Patient Left in chair;with call bell/phone within reach;with chair alarm set   Nurse Communication          Time: [phone] OT Time Calculation (min): 33 min  Charges: OT General Charges $OT Visit: 1 Visit OT Treatments $Self Care/Home Management : 23-37 mins  [name], OTR/L Acute Rehabilitation Services [phone] [facility] pager [phone] office 01/28/2018   [name] 01/28/2018, 11:11 AM

## 2018-01-28 NOTE — Care Management Note (Signed)
Case Management Note  Patient Details  Name: [name] MRN: [id number] Date of Birth: October 31, 1940  Subjective/Objective:   Spoke with patient at bedside. He is very pleasant and  confident about his plan for d/c. He has a male friend who will transport him home, she also assist his with getting to appointments. His son is available each evening, he plans to have microwavable food available, has 3n1 for toileting. Request a RW. He continues to decline SNF and [place] services, he has discussed with Dr. Alvan Dame and he is in agreement.              Action/Plan: Contacted [facility] to deliver RW to room. Declines assistance.   Expected Discharge Date:  01/28/18               Expected Discharge Plan:  Home/Self Care  In-House Referral:  NA  Discharge planning Services  CM Consult  Post Acute Care Choice:  NA Choice offered to:  Patient  DME Arranged:  Gilford Rile rolling DME Agency:  [place]:  Patient Refused HH, Refused SNF [facility] Agency:  NA  Status of Service:  Completed, signed off  If discussed at [place] of Stay Meetings, dates discussed:    Additional Comments:  [name], RN 01/28/2018, 10:55 AM

## 2018-01-28 NOTE — Plan of Care (Signed)

## 2018-01-28 NOTE — Care Management Note (Signed)
Case Management Note  Patient Details  Name: [name] MRN: [id number] Date of Birth: 05-21-1940  Subjective/Objective:      Patient called me back to the room. He states he spoke with his friend and now agrees SNF would be the best option.              Action/Plan: Consulted CSW, updated attending  Expected Discharge Date:  01/28/18               Expected Discharge Plan:  [place]  In-House Referral:  Clinical Social Work  Discharge planning Services  CM Consult  Post Acute Care Choice:  NA Choice offered to:  Patient  DME Arranged:  N/A DME Agency:  NA  HH Arranged:  NA HH Agency:  NA  Status of Service:  Completed, signed off  If discussed at [employer], dates discussed:    Additional Comments:  [name], RN 01/28/2018, 11:19 AM

## 2018-01-28 NOTE — Progress Notes (Signed)
PROGRESS NOTE    [name]  BWL:[id number] DOB: 04-06-1941 DOA: 01/25/2018 PCP: [name], MD    Brief Narrative: 77 y.o.malewith medical history significant forCOPD, coronary artery disease s/p cabg, a flutter s/p ablation, chronic diastolic CHF, chronic pain, liver cirrhosis, and chronic kidney disease stage III, now presenting to the emergency department for evaluation of severe left hip pain after a fall. Patient reports that he been in his usual state of health and was walking into a convenient store when he tripped over a wheelchair ramp, landed on his left side, and experienced immediate and severe pain at the left hip. Radiographs reveal impacted fracture of the left femoral neck. admitted for ongoing evaluation and management of the left hip fracture. He underwent left hemiarthroplasty Assessment & Plan:   Principal Problem:   Closed left hip fracture, initial encounter [name]) Active Problems:   Coronary atherosclerosis   COPD (chronic obstructive pulmonary disease) (HCC)   Chronic back pain   Cirrhosis (HCC)   Second degree AV block, Mobitz type I   Chronic diastolic heart failure (HCC)   CKD (chronic kidney disease) stage 3, GFR 30-59 ml/min (HCC)  Impacted fracture of the left femoral neck Status post left hip arthroplasty by Dr. Alvan Dame. Pain control and physical therapy.  Patient  reports pain is not well controlled with oral every 4 hours and better controlled with IV morphine. He wants to be on IV pain meds for another 24 hours before being discharged. Meanwhile PT evaluation done and recommended SNF placement.      History of chronic back pain Resume home medications. Pain control.     chronic diastolic heart failure He appears compensated. Resume lasix and spironolactone on discharge.    Stage III CKD  Creatinine appears to be back to baseline today.   COPD No wheezing heard.  Resume inhalers as needed.   History of liver  cirrhosis He appears to be compensated.  Resume diuretics on discharge.   History of coronary artery disease status post CABG in 2010 Patient denies any chest pain or shortness of breath at this time. Resume aspirin .    DVT prophylaxis: SCDs Code Status: Full code Family Communication: none at bedside.  Disposition Plan: plan for SNF in am.   Consultants:   ORTHOPEDICS.   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    Procedures:  S/p left hemiarthroplasty of the hip   Antimicrobials: none.    Subjective: Pt reports pain control with IV morphine. No chest pain or sob. He changed his mind about going home , he wants to go to SNF instead.   Objective: Vitals:   01/27/18 1320 01/27/18 2038 01/28/18 0607 01/28/18 1251  BP: 126/61 (!) 125/52 (!) 128/56 136/60  Pulse: 68 64 60 62  Resp: 16 16 15 16   Temp: 98 F (36.7 C) 98.4 F (36.9 C) 98.1 F (36.7 C) 98.2 F (36.8 C)  TempSrc: Oral Oral Oral Oral  SpO2: 94% 94% (!) 86% 97%  Weight:   83.7 kg   Height:        Intake/Output Summary (Last 24 hours) at 01/28/2018 1722 Last data filed at 01/28/2018 1251 Gross per 24 hour  Intake 520 ml  Output 1375 ml  Net -855 ml   Filed Weights   01/26/18 0500 01/27/18 0500 01/28/18 0607  Weight: 80.6 kg 78.6 kg 83.7 kg    Examination:  General exam: Appears calm and comfortable ,laying in the bed.  Respiratory system:  Clear to auscultation. Respiratory effort normal. No wheezing or rhonchi.  Cardiovascular system: S1 & S2 heard, RRR. No JVD,. No pedal edema. Gastrointestinal system: Abdomen is soft NT ND BS+ Central nervous system: Alert and oriented. NON FOCAL.  Extremities:  Persistent painful ROM of the left lower extremity.  Skin: No rashes, lesions or ulcers Psychiatry: . Mood & affect appropriate.     Data Reviewed: I have personally reviewed following labs and imaging studies  CBC: Recent Labs  Lab 01/26/18 0106  01/26/18 0345 01/27/18 0604 01/28/18 0527  WBC 9.7 8.7 11.3* 8.0  NEUTROABS 7.7  --   --   --   HGB 13.1 12.6* 10.9* 9.9*  HCT 39.5 38.9* 34.8* 32.4*  MCV 86.4 86.3 88.1 89.8  PLT 245 233 198 979   Basic Metabolic Panel: Recent Labs  Lab 01/26/18 0106 01/26/18 0345 01/27/18 0604 01/28/18 0527  NA 139 140 141 141  K 4.5 4.3 5.1 4.1  CL 101 102 104 107  CO2 25 26 28 25   GLUCOSE 131* 130* 123* 114*  BUN 37* 38* 37* 37*  CREATININE 2.16* 2.04* 1.81* 1.30*  CALCIUM 9.3 9.1 8.4* 8.4*   GFR: Estimated Creatinine Clearance: 52.3 mL/min (A) (by C-G formula based on SCr of 1.3 mg/dL (H)). Liver Function Tests: Recent Labs  Lab 01/26/18 0106  AST 40  ALT 27  ALKPHOS 82  BILITOT 1.4*  PROT 7.8  ALBUMIN 4.3   No results for input(s): LIPASE, AMYLASE in the last 168 hours. No results for input(s): AMMONIA in the last 168 hours. Coagulation Profile: No results for input(s): INR, PROTIME in the last 168 hours. Cardiac Enzymes: No results for input(s): CKTOTAL, CKMB, CKMBINDEX, TROPONINI in the last 168 hours. BNP (last 3 results) Recent Labs    05/18/17 1641  PROBNP 622.0*   HbA1C: No results for input(s): HGBA1C in the last 72 hours. CBG: No results for input(s): GLUCAP in the last 168 hours. Lipid Profile: No results for input(s): CHOL, HDL, LDLCALC, TRIG, CHOLHDL, LDLDIRECT in the last 72 hours. Thyroid Function Tests: No results for input(s): TSH, T4TOTAL, FREET4, T3FREE, THYROIDAB in the last 72 hours. Anemia Panel: No results for input(s): VITAMINB12, FOLATE, FERRITIN, TIBC, IRON, RETICCTPCT in the last 72 hours. Sepsis Labs: No results for input(s): PROCALCITON, LATICACIDVEN in the last 168 hours.  No results found for this or any previous visit (from the past 240 hour(s)).       Radiology Studies: No results found.      Scheduled Meds: . aspirin EC  325 mg Oral BID  . docusate sodium  100 mg Oral BID  . feeding supplement (ENSURE ENLIVE)  237  mL Oral BID BM  . ferrous sulfate  325 mg Oral TID PC  . finasteride  5 mg Oral Daily  . hydrALAZINE  10 mg Oral TID  . multivitamin  1 tablet Oral Daily  . oxyCODONE  30 mg Oral Q4H  . pravastatin  20 mg Oral q1800  . sertraline  25 mg Oral Daily  . tamsulosin  0.4 mg Oral Daily   Continuous Infusions: . sodium chloride 75 mL/hr at 01/27/18 0600     LOS: 2 days    Time spent: 28 minutes.     Hosie Poisson, MD [facility] Pager [phone]  If 7PM-7AM, please contact night-coverage [email or web address] Password [email or web address] 01/28/2018, 5:22 PM

## 2018-01-28 NOTE — Progress Notes (Signed)
Physical Therapy Treatment Patient Details Name: [name] MRN: [id number] DOB: [birth year] Today's Date: 01/28/2018    History of Present Illness &77 yo male tripped and fell out in community on 01/25/18 , sustained  displaced femoral neck fracture on left. Post  posterior hemiarthroplasy 01/27/16. patient has h/p CABG, COPD, CHF, liver cirrhosis, unspecified neurological deficits of left extremities. Per patient report: diagnosed with Cerebral palsy. Has lift in left shoe for LLD.    PT Comments    Pt OOB in recliner.  Required + 2 assist for mobility with limited distance.   Pt progressing slowly and now agrees SNF would be best,   Follow Up Recommendations  SNF(per chart review pt agreeing to SNF now)     Equipment Recommendations       Recommendations for Other Services       Precautions / Restrictions Precautions Precautions: Fall;Posterior Hip Precaution Booklet Issued: Yes (comment) Precaution Comments: verbally stated 3/3 however required VC's during functional activity Restrictions Weight Bearing Restrictions: No LLE Weight Bearing: Weight bearing as tolerated    Mobility  Bed Mobility               General bed mobility comments: OOB in recliner  Transfers Overall transfer level: Needs assistance Equipment used: Rolling walker (2 wheeled) Transfers: Sit to/from Stand Sit to Stand: Min assist;Mod assist         General transfer comment: 50% VC's to avoid hip flex > 90 degrees with sit to stand and stand to sit.   Ambulation/Gait Ambulation/Gait assistance: Mod assist;+2 physical assistance;+2 safety/equipment Gait Distance (Feet): 7 Feet Assistive device: Rolling walker (2 wheeled) Gait Pattern/deviations: Step-to pattern;Decreased step length - left;Decreased stance time - left;Trunk flexed;Staggering right Gait velocity: decreased   General Gait Details: very limited amb distance and unsteady gait with poor forward flex posture and flex  hips/knees   Stairs             Wheelchair Mobility    Modified Rankin (Stroke Patients Only)       Balance                                            Cognition Arousal/Alertness: Awake/alert Behavior During Therapy: WFL for tasks assessed/performed Overall Cognitive Status: Within Functional Limits for tasks assessed                                 General Comments: pt better understands precautions but needs cues during functional activities. Cues for safety      Exercises      General Comments        Pertinent Vitals/Pain Pain Assessment: 0-10 Pain Score: 4  Pain Location: left hip Pain Descriptors / Indicators: Discomfort;Tender Pain Intervention(s): Monitored during session;Repositioned;Ice applied    Home Living                      Prior Function            PT Goals (current goals can now be found in the care plan section) Progress towards PT goals: Progressing toward goals    Frequency    Min 5X/week      PT Plan Current plan remains appropriate    Co-evaluation  AM-PAC PT "6 Clicks" Daily Activity  Outcome Measure  Difficulty turning over in bed (including adjusting bedclothes, sheets and blankets)?: A Lot Difficulty moving from lying on back to sitting on the side of the bed? : A Lot Difficulty sitting down on and standing up from a chair with arms (e.g., wheelchair, bedside commode, etc,.)?: A Lot   Help needed walking in hospital room?: Total Help needed climbing 3-5 steps with a railing? : Total 6 Click Score: 8    End of Session   Activity Tolerance: Patient limited by fatigue Patient left: in chair;with call bell/phone within reach;with chair alarm set Nurse Communication: Mobility status PT Visit Diagnosis: Unsteadiness on feet (R26.81);History of falling (Z91.81);Pain Pain - Right/Left: Left Pain - part of body: Hip     Time: 5374-8270 PT Time  Calculation (min) (ACUTE ONLY): 12 min  Charges:  $Gait Training: 8-22 mins                     Rica Koyanagi  PTA Acute  Rehabilitation Services Pager      [phone] Office      [phone]

## 2018-01-28 NOTE — Progress Notes (Signed)
Patient ID: [name], male   DOB: 1940/12/26, 77 y.o.   MRN: [id number] Subjective: 2 Days Post-Op Procedure(s) (LRB): LEFT HIP POSTERIOR HEMIARTHROPLASTY (Left)    Patient reports pain as mild.  Doing well with therapy.  Planning to go home today  Objective:   VITALS:   Vitals:   01/27/18 2038 01/28/18 0607  BP: (!) 125/52 (!) 128/56  Pulse: 64 60  Resp: 16 15  Temp: 98.4 F (36.9 C) 98.1 F (36.7 C)  SpO2: 94% (!) 86%    Neurovascular intact Incision: dressing C/D/I  Significant contracture LLE related to underlying CP  LABS Recent Labs    01/26/18 0345 01/27/18 0604 01/28/18 0527  HGB 12.6* 10.9* 9.9*  HCT 38.9* 34.8* 32.4*  WBC 8.7 11.3* 8.0  PLT 233 198 173    Recent Labs    01/26/18 0345 01/27/18 0604 01/28/18 0527  NA 140 141 141  K 4.3 5.1 4.1  BUN 38* 37* 37*  CREATININE 2.04* 1.81* 1.30*  GLUCOSE 130* 123* 114*    No results for input(s): LABPT, INR in the last 72 hours.   Assessment/Plan: 2 Days Post-Op Procedure(s) (LRB): LEFT HIP POSTERIOR HEMIARTHROPLASTY (Left)   Up with therapy  Home today with therapy exercises from hospital Walker at all times WBAT RTC in 2 weeks ASA for DVT prophylaxis

## 2018-01-28 NOTE — Clinical Social Work Note (Addendum)
Clinical Social Work Assessment  Patient Details  Name: [name] MRN: [id number] Date of Birth: 10-Nov-1940  Date of referral:  01/28/18               Reason for consult:  Discharge Planning, Facility Placement                Permission sought to share information with:    Permission granted to share information::     Name::       Renell, Allum  Agency::  SNF  Relationship::   Son   Contact Information:    [phone], [phone]  Housing/Transportation Living arrangements for the past 2 months:  [place] of Information:  Patient Patient Interpreter Needed:  None Criminal Activity/Legal Involvement Pertinent to Current Situation/Hospitalization:  No - Comment as needed Significant Relationships:  Adult Children, Friend Lives with:  Self Do you feel safe going back to the place where you live?  Yes Need for family participation in patient care:  Yes (Comment)  Care giving concerns:  Patient reports that he been in his usual state of health and was walking into a convenient store when he tripped over a wheelchair ramp, landed on his left side, and experienced immediate and severe pain at the left hip.  SNF placement for short rehab.  Closed left hip fracture.   Social Worker assessment / plan: CSW met the patient at bedside to discuss discharge to SNF. Patient report after discussing with a friend who is also a nurse he is agreeable to short rehab at [facility]. Patient reports he prefers to go home but is very fearful of falling.  CSW explain SNF process and provided the patient with a list of bed offers. CSW discussed patient options and explain the insurance process.  Patient report understanding the process and chose a facility close to his home. Patient reports plans to stay for about week then transition home.   Plan: SNF fl2 Done. PASRR done.   Employment status:  Retired Forensic scientist:    PT Recommendations:  [place] / Referral to community resources:  [place]  Patient/Family's Response to care:  Agreeable and Responding well to care.   Patient/Family's Understanding of and Emotional Response to Diagnosis, Current Treatment, and Prognosis: Patient and alert and oriented. Patient has some understanding of his diagnosis and care as he was able to recall his conversation with the orthopedic physician. Patient reports his son is involved in his care and plans to temporarily  stay with him during the evening after he is discharge from the rehab facility. Patient also has a friend that is a Marine scientist and friend that helps around the house.   Emotional Assessment Appearance:  Developmentally appropriate, Appears stated age Attitude/Demeanor/Rapport:    Affect (typically observed):  Accepting, Pleasant Orientation:  Oriented to Self, Oriented to Situation, Oriented to Place, Oriented to  Time Alcohol / Substance use:  Not Applicable Psych involvement (Current and /or in the community):  No (Comment)  Discharge Needs  Concerns to be addressed:  Discharge Planning Concerns Readmission within the last 30 days:  No Current discharge risk:  Dependent with Mobility Barriers to Discharge:  Continued Medical Work up, [place], LCSW 01/28/2018, 3:49 PM

## 2018-01-28 NOTE — NC FL2 (Addendum)
[place] LEVEL OF CARE SCREENING TOOL     IDENTIFICATION  Patient Name: [name] Birthdate: 06/26/1940 Sex: male Admission Date (Current Location): 01/25/2018  [facility] and [place] Number:  Herbalist and Address:  [facility],  [place] [address], [place]      Provider Number: [id number]  Attending Physician Name and Address:  Hosie Poisson, MD  Relative Name and Phone Number:       Current Level of Care: Hospital Recommended Level of Care: [place] Prior Approval Number:    Date Approved/Denied:   PASRR Number:   [id number]   Discharge Plan: SNF    Current Diagnoses: Patient Active Problem List   Diagnosis Date Noted  . Closed left hip fracture, initial encounter ([place]) 01/26/2018  . Neck pain 12/18/2017  . Right hip pain 06/22/2017  . Chronic diastolic heart failure ([place]) 03/16/2017  . CKD (chronic kidney disease) stage 3, GFR 30-59 ml/min (HCC) 03/16/2017  . Diastolic dysfunction 47/82/9562  . Opiate dependence ([place]) 02/13/2017  . Uremia 01/21/2017  . Acute renal failure superimposed on stage 3 chronic kidney disease ([place]) 01/21/2017  . Atrial flutter ([place]) 11/24/2014  . Aortic stenosis 03/24/2014  . Second degree AV block, Mobitz type I 09/06/2013  . Cirrhosis ([place]) 07/28/2013  . Bladder outlet obstruction 07/28/2013  . Impaired glucose metabolism   . Chronic back pain   . Benign neoplasm of colon 01/08/2011  . Abdominal aortic aneurysm ([place]) 08/12/2010  . TOBACCO USE, QUIT 04/11/2009  . Occlusion and stenosis of carotid artery 01/08/2009  . Hyperlipidemia 11/13/2008  . ANEMIA 11/13/2008  . Essential hypertension 11/13/2008  . Coronary atherosclerosis 11/13/2008  . COPD (chronic obstructive pulmonary disease) ([place]) 11/13/2008    Orientation RESPIRATION BLADDER Height & Weight     Self, Time, Situation, Place  Normal Continent Weight: 184 lb 8.4 oz (83.7 kg) Height:  5\' 11"  (180.3  cm)  BEHAVIORAL SYMPTOMS/MOOD NEUROLOGICAL BOWEL NUTRITION STATUS      Continent Diet(Low Sodium Heart Healthy )  AMBULATORY STATUS COMMUNICATION OF NEEDS Skin   Limited Assist Verbally Normal                       Personal Care Assistance Level of Assistance  Bathing, Feeding, Dressing Bathing Assistance: Limited assistance Feeding assistance: Independent Dressing Assistance: Limited assistance     Functional Limitations Info  Sight, Hearing, Speech Sight Info: Impaired(Wears Glasses) Hearing Info: Adequate Speech Info: Adequate    SPECIAL CARE FACTORS FREQUENCY  PT (By licensed PT), OT (By licensed OT)     PT Frequency: 5x/week OT Frequency: 5x/week            Contractures Contractures Info: Not present    Additional Factors Info  Code Status, Allergies, Psychotropic Code Status Info: Fullcode Allergies Info: Doxycycline, Amlodipine, Fish Allergy, Hydrocodone, Other, Tylenol Acetaminophen Psychotropic Info: Zoloft          Current Medications (01/28/2018):  This is the current hospital active medication list Current Facility-Administered Medications  Medication Dose Route Frequency Provider Last Rate Last Dose  . 0.9 %  sodium chloride infusion   Intravenous Continuous Danae Orleans, PA-C 75 mL/hr at 01/27/18 0600    . albuterol (PROVENTIL) (2.5 MG/3ML) 0.083% nebulizer solution 2.5 mg  2.5 mg Nebulization Q6H PRN Babish, Matthew, PA-C      . aspirin EC tablet 325 mg  325 mg Oral BID Danae Orleans, PA-C   325 mg at 01/28/18 1308  .  docusate sodium (COLACE) capsule 100 mg  100 mg Oral BID Danae Orleans, PA-C   100 mg at 01/28/18 0920  . feeding supplement (ENSURE ENLIVE) (ENSURE ENLIVE) liquid 237 mL  237 mL Oral BID BM Hosie Poisson, MD   237 mL at 01/28/18 1433  . ferrous sulfate tablet 325 mg  325 mg Oral TID PC Danae Orleans, PA-C   325 mg at 01/28/18 1248  . finasteride (PROSCAR) tablet 5 mg  5 mg Oral Daily Danae Orleans, PA-C   5 mg at  01/28/18 0920  . guaiFENesin (MUCINEX) 12 hr tablet 1,200 mg  1,200 mg Oral BID PRN Danae Orleans, PA-C   1,200 mg at 01/26/18 2005  . hydrALAZINE (APRESOLINE) tablet 10 mg  10 mg Oral TID Danae Orleans, PA-C   Stopped at 01/28/18 1950  . lactulose (CHRONULAC) 10 GM/15ML solution 20 g  20 g Oral BID PRN Danae Orleans, PA-C   20 g at 01/28/18 9326  . menthol-cetylpyridinium (CEPACOL) lozenge 3 mg  1 lozenge Oral PRN Danae Orleans, PA-C       Or  . phenol (CHLORASEPTIC) mouth spray 1 spray  1 spray Mouth/Throat PRN Babish, Matthew, PA-C      . metoCLOPramide (REGLAN) tablet 5-10 mg  5-10 mg Oral Q8H PRN Danae Orleans, PA-C       Or  . metoCLOPramide (REGLAN) injection 5-10 mg  5-10 mg Intravenous Q8H PRN Danae Orleans, PA-C      . morphine 2 MG/ML injection 1-3 mg  1-3 mg Intravenous Q2H PRN Danae Orleans, PA-C   3 mg at 01/26/18 0916  . multivitamin (PROSIGHT) tablet 1 tablet  1 tablet Oral Daily Hosie Poisson, MD   1 tablet at 01/28/18 0920  . ondansetron (ZOFRAN) tablet 4 mg  4 mg Oral Q6H PRN Danae Orleans, PA-C       Or  . ondansetron [facility]) injection 4 mg  4 mg Intravenous Q6H PRN Danae Orleans, PA-C      . oxyCODONE (Oxy IR/ROXICODONE) immediate release tablet 30 mg  30 mg Oral Q4H Babish, Rodman [place], PA-C   30 mg at 01/28/18 1248  . pravastatin (PRAVACHOL) tablet 20 mg  20 mg Oral q1800 Danae Orleans, PA-C   20 mg at 01/27/18 1931  . sertraline (ZOLOFT) tablet 25 mg  25 mg Oral Daily Danae Orleans, PA-C   25 mg at 01/28/18 0920  . tamsulosin (FLOMAX) capsule 0.4 mg  0.4 mg Oral Daily Danae Orleans, PA-C   0.4 mg at 01/28/18 7124     Discharge Medications: Please see discharge summary for a list of discharge medications.  Relevant Imaging Results:  Relevant Lab Results:   Additional Information ssn:[id number]  Lia Hopping, LCSW

## 2018-01-28 NOTE — Progress Notes (Signed)
Patient agreeable to SNF. Patient can discharge to [facility] tomorrow.   Kathrin Greathouse, Marlinda Mike, MSW Clinical Social Worker  [phone] 01/28/2018  4:36 PM

## 2018-01-29 ENCOUNTER — Inpatient Hospital Stay (HOSPITAL_COMMUNITY): Payer: Medicare Other

## 2018-01-29 DIAGNOSIS — Z7901 Long term (current) use of anticoagulants: Secondary | ICD-10-CM | POA: Diagnosis not present

## 2018-01-29 DIAGNOSIS — T7840XD Allergy, unspecified, subsequent encounter: Secondary | ICD-10-CM | POA: Diagnosis not present

## 2018-01-29 DIAGNOSIS — S72002D Fracture of unspecified part of neck of left femur, subsequent encounter for closed fracture with routine healing: Secondary | ICD-10-CM | POA: Diagnosis not present

## 2018-01-29 DIAGNOSIS — N401 Enlarged prostate with lower urinary tract symptoms: Secondary | ICD-10-CM | POA: Diagnosis not present

## 2018-01-29 DIAGNOSIS — Z951 Presence of aortocoronary bypass graft: Secondary | ICD-10-CM | POA: Diagnosis not present

## 2018-01-29 DIAGNOSIS — R279 Unspecified lack of coordination: Secondary | ICD-10-CM | POA: Diagnosis not present

## 2018-01-29 DIAGNOSIS — I441 Atrioventricular block, second degree: Secondary | ICD-10-CM | POA: Diagnosis not present

## 2018-01-29 DIAGNOSIS — I5032 Chronic diastolic (congestive) heart failure: Secondary | ICD-10-CM | POA: Diagnosis not present

## 2018-01-29 DIAGNOSIS — K5900 Constipation, unspecified: Secondary | ICD-10-CM | POA: Diagnosis not present

## 2018-01-29 DIAGNOSIS — S72002A Fracture of unspecified part of neck of left femur, initial encounter for closed fracture: Secondary | ICD-10-CM | POA: Diagnosis not present

## 2018-01-29 DIAGNOSIS — R609 Edema, unspecified: Secondary | ICD-10-CM | POA: Diagnosis not present

## 2018-01-29 DIAGNOSIS — J309 Allergic rhinitis, unspecified: Secondary | ICD-10-CM | POA: Diagnosis not present

## 2018-01-29 DIAGNOSIS — E46 Unspecified protein-calorie malnutrition: Secondary | ICD-10-CM | POA: Diagnosis not present

## 2018-01-29 DIAGNOSIS — M549 Dorsalgia, unspecified: Secondary | ICD-10-CM | POA: Diagnosis not present

## 2018-01-29 DIAGNOSIS — Z743 Need for continuous supervision: Secondary | ICD-10-CM | POA: Diagnosis not present

## 2018-01-29 DIAGNOSIS — Z4889 Encounter for other specified surgical aftercare: Secondary | ICD-10-CM | POA: Diagnosis not present

## 2018-01-29 DIAGNOSIS — M545 Low back pain: Secondary | ICD-10-CM | POA: Diagnosis not present

## 2018-01-29 DIAGNOSIS — I2510 Atherosclerotic heart disease of native coronary artery without angina pectoris: Secondary | ICD-10-CM | POA: Diagnosis not present

## 2018-01-29 DIAGNOSIS — M62838 Other muscle spasm: Secondary | ICD-10-CM | POA: Diagnosis not present

## 2018-01-29 DIAGNOSIS — N183 Chronic kidney disease, stage 3 (moderate): Secondary | ICD-10-CM | POA: Diagnosis not present

## 2018-01-29 DIAGNOSIS — Z96642 Presence of left artificial hip joint: Secondary | ICD-10-CM | POA: Diagnosis not present

## 2018-01-29 DIAGNOSIS — J449 Chronic obstructive pulmonary disease, unspecified: Secondary | ICD-10-CM | POA: Diagnosis not present

## 2018-01-29 DIAGNOSIS — K7469 Other cirrhosis of liver: Secondary | ICD-10-CM | POA: Diagnosis not present

## 2018-01-29 DIAGNOSIS — M25552 Pain in left hip: Secondary | ICD-10-CM | POA: Diagnosis not present

## 2018-01-29 DIAGNOSIS — R972 Elevated prostate specific antigen [PSA]: Secondary | ICD-10-CM | POA: Diagnosis not present

## 2018-01-29 DIAGNOSIS — Z471 Aftercare following joint replacement surgery: Secondary | ICD-10-CM | POA: Diagnosis not present

## 2018-01-29 DIAGNOSIS — R002 Palpitations: Secondary | ICD-10-CM | POA: Diagnosis not present

## 2018-01-29 DIAGNOSIS — R062 Wheezing: Secondary | ICD-10-CM | POA: Diagnosis not present

## 2018-01-29 DIAGNOSIS — K7460 Unspecified cirrhosis of liver: Secondary | ICD-10-CM | POA: Diagnosis not present

## 2018-01-29 DIAGNOSIS — I10 Essential (primary) hypertension: Secondary | ICD-10-CM | POA: Diagnosis not present

## 2018-01-29 DIAGNOSIS — R310 Gross hematuria: Secondary | ICD-10-CM | POA: Diagnosis not present

## 2018-01-29 DIAGNOSIS — G8929 Other chronic pain: Secondary | ICD-10-CM | POA: Diagnosis not present

## 2018-01-29 DIAGNOSIS — R2689 Other abnormalities of gait and mobility: Secondary | ICD-10-CM | POA: Diagnosis not present

## 2018-01-29 DIAGNOSIS — F329 Major depressive disorder, single episode, unspecified: Secondary | ICD-10-CM | POA: Diagnosis not present

## 2018-01-29 DIAGNOSIS — R3912 Poor urinary stream: Secondary | ICD-10-CM | POA: Diagnosis not present

## 2018-01-29 LAB — BASIC METABOLIC PANEL
Anion gap: 8 (ref 5–15)
BUN: 35 mg/dL — ABNORMAL HIGH (ref 8–23)
CO2: 24 mmol/L (ref 22–32)
Calcium: 8.4 mg/dL — ABNORMAL LOW (ref 8.9–10.3)
Chloride: 106 mmol/L (ref 98–111)
Creatinine, Ser: 1.22 mg/dL (ref 0.61–1.24)
GFR calc Af Amer: >60 mL/min (ref >60)
GFR calc non Af Amer: 56 mL/min — ABNORMAL LOW (ref >60)
Glucose, Bld: 133 mg/dL — ABNORMAL HIGH (ref 70–99)
Potassium: 4.1 mmol/L (ref 3.5–5.1)
Sodium: 138 mmol/L (ref 135–145)

## 2018-01-29 LAB — CBC
HCT: 31.3 % — ABNORMAL LOW (ref 39.0–52.0)
Hemoglobin: 9.7 g/dL — ABNORMAL LOW (ref 13.0–17.0)
MCH: 27.8 pg (ref 26.0–34.0)
MCHC: 31 g/dL (ref 30.0–36.0)
MCV: 89.7 fL (ref 80.0–100.0)
Platelets: 175 10*3/uL (ref 150–400)
RBC: 3.49 MIL/uL — ABNORMAL LOW (ref 4.22–5.81)
RDW: 15.8 % — ABNORMAL HIGH (ref 11.5–15.5)
WBC: 7.4 10*3/uL (ref 4.0–10.5)
nRBC: 0 % (ref 0.0–0.2)

## 2018-01-29 NOTE — Progress Notes (Signed)
Occupational Therapy Treatment Patient Details Name: [name] MRN: [id number] DOB: 03-06-1941 Today's Date: 01/29/2018    History of present illness 77 yo male tripped and fell out in community on 01/25/18 , sustained  displaced femoral neck fracture on left. Post  posterior hemiarthroplasy 01/27/16. patient has h/p CABG, COPD, CHF, liver cirrhosis, unspecified neurological deficits of left extremities. Per patient report: diagnosed with Cerebral palsy. Has lift in left shoe for LLD.   OT comments  Pt plans snf now.  He verbalized THPs but needs cues during functional activities. Improved sit to stand and transfer to chair today.  Follow Up Recommendations  SNF    Equipment Recommendations  3 in 1 bedside commode    Recommendations for Other Services      Precautions / Restrictions Precautions Precautions: Fall;Posterior Hip Precaution Comments: verbally stated 3/3 however required VC's during functional activity Restrictions Weight Bearing Restrictions: No LLE Weight Bearing: Weight bearing as tolerated       Mobility Bed Mobility         Supine to sit: Min assist     General bed mobility comments: guided RLE to avoid IR and light assistance for trunk  Transfers Overall transfer level: Needs assistance Equipment used: Rolling walker (2 wheeled) Transfers: Sit to/from Stand Sit to Stand: Min assist         General transfer comment: vcs for sequence to manage RLE    Balance                                           ADL either performed or assessed with clinical judgement   ADL                           Toilet Transfer: Minimal assistance;Stand-pivot;RW(chair)             General ADL Comments: worked on SPT to chair. Pt had already bathed and did not want to work with AE this session.  He needed cues not to reach to adjust sock when therapist was donning shoes and also needed cues not to cross legs  (R over L so that  he doesn't adduct RLE)     Vision       Perception     Praxis      Cognition Arousal/Alertness: Awake/alert Behavior During Therapy: WFL for tasks assessed/performed Overall Cognitive Status: Within Functional Limits for tasks assessed                                          Exercises    Shoulder Instructions       General Comments      Pertinent Vitals/ Pain       Pain Assessment: 0-10 Pain Score: 6  Pain Location: left hip Pain Descriptors / Indicators: Discomfort;Tender Pain Intervention(s): Limited activity within patient's tolerance;Monitored during session;Premedicated before session;Repositioned;Ice applied  Home Living                                          Prior Functioning/Environment              Frequency  Min 2X/week  Progress Toward Goals  OT Goals(current goals can now be found in the care plan section)  Progress towards OT goals: Progressing toward goals  Acute Rehab OT Goals Patient Stated Goal: home  Plan      Co-evaluation                 AM-PAC PT "6 Clicks" Daily Activity     Outcome Measure   Help from another person eating meals?: None Help from another person taking care of personal grooming?: A Little Help from another person toileting, which includes using toliet, bedpan, or urinal?: A Lot Help from another person bathing (including washing, rinsing, drying)?: A Lot Help from another person to put on and taking off regular upper body clothing?: A Little Help from another person to put on and taking off regular lower body clothing?: A Lot 6 Click Score: 16    End of Session    OT Visit Diagnosis: Muscle weakness (generalized) (M62.81);Pain Pain - Right/Left: Left Pain - part of body: Hip   Activity Tolerance Patient tolerated treatment well   Patient Left in chair;with call bell/phone within reach;with chair alarm set   Nurse Communication          Time:  8264-1583 OT Time Calculation (min): 22 min  Charges: OT General Charges $OT Visit: 1 Visit OT Treatments $Therapeutic Activity: 8-22 mins  Lesle Chris, OTR/L Acute Rehabilitation Services [phone] [facility] pager [phone] office 01/29/2018   Dmarcus Decicco 01/29/2018, 11:30 AM

## 2018-01-29 NOTE — Progress Notes (Signed)
Physical Therapy Treatment Patient Details Name: [name] MRN: [id number] DOB: Feb 12, 1941 Today's Date: 01/29/2018    History of Present Illness 77 yo male tripped and fell out in community on 01/25/18 , sustained  displaced femoral neck fracture on left. Post  posterior hemiarthroplasy 01/27/16. patient has h/p CABG, COPD, CHF, liver cirrhosis, unspecified neurological deficits of left extremities. Per patient report: diagnosed with Cerebral palsy. Has lift in left shoe for LLD.    PT Comments    Pt is progressing toward goals; incr gait distance/tolerance today although with LOB x2 during 24' and fatigues quickly therefore continue to recommend SNF to maximize indpendence and safety  Follow Up Recommendations  SNF     Equipment Recommendations  Rolling walker with 5" wheels    Recommendations for Other Services       Precautions / Restrictions Precautions Precautions: Fall;Posterior Hip Precaution Comments: verbally stated 3/3 however required VC's during functional activity Restrictions Weight Bearing Restrictions: No LLE Weight Bearing: Weight bearing as tolerated    Mobility  Bed Mobility               General bed mobility comments: OOB in recliner  Transfers Overall transfer level: Needs assistance Equipment used: Rolling walker (2 wheeled) Transfers: Sit to/from Stand Sit to Stand: Min assist         General transfer comment: multi-modal cues for THP--avoid internal rotation and avoid flexing hip >90*  and for correct hand placement  Ambulation/Gait Ambulation/Gait assistance: Min assist Gait Distance (Feet): 65 Feet Assistive device: Rolling walker (2 wheeled) Gait Pattern/deviations: Step-to pattern;Decreased step length - left;Decreased stance time - left;Trunk flexed;Staggering right     General Gait Details: fatigues quickly, LOB x2 to R with delayed reaction/recovery; cues for posture, RW position   Stairs              Wheelchair Mobility    Modified Rankin (Stroke Patients Only)       Balance                                            Cognition Arousal/Alertness: Awake/alert Behavior During Therapy: WFL for tasks assessed/performed Overall Cognitive Status: Within Functional Limits for tasks assessed                                        Exercises General Exercises - Lower Extremity Ankle Circles/Pumps: AROM;Both;5 reps    General Comments        Pertinent Vitals/Pain Pain Assessment: 0-10 Pain Score: 3  Pain Location: left hip Pain Descriptors / Indicators: Discomfort;Tender Pain Intervention(s): Limited activity within patient's tolerance;Monitored during session;Ice applied    Home Living                      Prior Function            PT Goals (current goals can now be found in the care plan section) Acute Rehab PT Goals Patient Stated Goal: home PT Goal Formulation: With patient Time For Goal Achievement: 02/10/18 Potential to Achieve Goals: Good Progress towards PT goals: Progressing toward goals    Frequency    Min 3X/week      PT Plan Current plan remains appropriate;Frequency needs to be updated    Co-evaluation  AM-PAC PT "6 Clicks" Daily Activity  Outcome Measure  Difficulty turning over in bed (including adjusting bedclothes, sheets and blankets)?: A Lot Difficulty moving from lying on back to sitting on the side of the bed? : Unable Difficulty sitting down on and standing up from a chair with arms (e.g., wheelchair, bedside commode, etc,.)?: Unable Help needed moving to and from a bed to chair (including a wheelchair)?: A Lot Help needed walking in hospital room?: A Little Help needed climbing 3-5 steps with a railing? : A Lot 6 Click Score: 11    End of Session Equipment Utilized During Treatment: Gait belt;Other (comment)(L shoe with lift ) Activity Tolerance: Patient limited by  fatigue;Patient tolerated treatment well Patient left: in chair;with call bell/phone within reach;with chair alarm set   PT Visit Diagnosis: Unsteadiness on feet (R26.81);History of falling (Z91.81);Pain Pain - Right/Left: Left Pain - part of body: Hip     Time: 1028-1040 PT Time Calculation (min) (ACUTE ONLY): 12 min  Charges:  $Gait Training: 8-22 mins                     [name], PT  Pager: [phone] Acute Rehab Dept [facility]): [phone]   01/29/2018    [facility] [place] 01/29/2018, 10:54 AM

## 2018-01-29 NOTE — Care Management Important Message (Signed)
Important Message  Patient Details  Name: [name] MRN: [id number] Date of Birth: 03/02/1941   Medicare Important Message Given:  Yes    Kerin Salen 01/29/2018, 11:58 AMImportant Message  Patient Details  Name: [name] MRN: [id number] Date of Birth: 10-12-1940   Medicare Important Message Given:  Yes    Kerin Salen 01/29/2018, 11:58 AM

## 2018-01-29 NOTE — Progress Notes (Signed)
     Subjective: 3 Days Post-Op Procedure(s) (LRB): LEFT HIP POSTERIOR HEMIARTHROPLASTY (Left)   Patient reports pain as mild, pain controlled. No events throughout the night.  States that he worked slowly with Pt yesterday and is now considering SNF placement for a short time.    Objective:   VITALS:   Vitals:   01/28/18 2111 01/29/18 0505  BP: (!) 122/47 (!) 115/58  Pulse: (!) 49 (!) 50  Resp: 16 16  Temp: 98.1 F (36.7 C) 98 F (36.7 C)  SpO2: 94% 96%    Incision: dressing C/D/I No cellulitis present Compartment soft  LABS Recent Labs    01/27/18 0604 01/28/18 0527 01/29/18 0519  HGB 10.9* 9.9* 9.7*  HCT 34.8* 32.4* 31.3*  WBC 11.3* 8.0 7.4  PLT 198 173 175    Recent Labs    01/27/18 0604 01/28/18 0527 01/29/18 0519  NA 141 141 138  K 5.1 4.1 4.1  BUN 37* 37* 35*  CREATININE 1.81* 1.30* 1.22  GLUCOSE 123* 114* 133*     Assessment/Plan: 3 Days Post-Op Procedure(s) (LRB): LEFT HIP POSTERIOR HEMIARTHROPLASTY (Left) Up with therapy Discharge disposition TBD AP of the pelvis ordered to evaluate positioning of the hemi arthroplasty   Patient did have a foley placed by urology prior to surgery Urology was consulted for information regarding the foley  The plan is to d/c the foley at this time  The nurse knows to keep track of the pt's urination.  Dr. Jeffie Pollock mentioned that he would like to obtain post void residuals to see output  If not able to void in 6-8 hrs he would need a foley cath  If needing a cath a coude cath can be attempted,   If unsuccessful a repeat urology consult will be obtained for placement of a new cath  If all goes well he can follow up with his regular Urologist      [place] Pugh. Havier Deeb   PAC  01/29/2018, 7:56 AM

## 2018-01-29 NOTE — Clinical Social Work Placement (Addendum)
D/C Summary sent.    CLINICAL SOCIAL WORK PLACEMENT  NOTE  Date:  01/29/2018  Patient Details  Name: [name] MRN: [id number] Date of Birth: February 05, 1941  Clinical Social Work is seeking post-discharge placement for this patient at the [place] level of care (*CSW will initial, date and re-position this form in  chart as items are completed):  Yes   Patient/family provided with [facility] Work Department's list of facilities offering this level of care within the geographic area requested by the patient (or if unable, by the patient's family).  Yes   Patient/family informed of their freedom to choose among providers that offer the needed level of care, that participate in Medicare, Medicaid or managed care program needed by the patient, have an available bed and are willing to accept the patient.  Yes   Patient/family informed of [facility]'s ownership interest in [facility] and [facility], as well as of the fact that they are under no obligation to receive care at these facilities.  PASRR submitted to EDS on 01/28/18     PASRR number received on 01/28/18     Existing PASRR number confirmed on       FL2 transmitted to all facilities in geographic area requested by pt/family on       FL2 transmitted to all facilities within larger geographic area on 01/29/18     Patient informed that his/her managed care company has contracts with or will negotiate with certain facilities, including the following:  [facility], [place]     Yes   Patient/family informed of bed offers received.  Patient chooses bed at [place], [place]     Physician recommends and patient chooses bed at      Patient to be transferred to [place] on 01/29/18.  Patient to be transferred to facility by [facility]      Patient family notified on 01/29/18 of transfer.  Name of family member notified:  Patient to notify his son.       PHYSICIAN Please prepare priority discharge summary, including medications     Additional Comment:    _______________________________________________ Lia Hopping, LCSW 01/29/2018, 10:16 AM

## 2018-01-29 NOTE — Discharge Summary (Signed)
Physician Discharge Summary  [name] BTD:[id number] DOB: 12/03/1940 DOA: 01/25/2018  PCP: [name], MD  Admit date: 01/25/2018 Discharge date: 01/29/2018  Admitted From: snf Disposition:  SNF  Recommendations for Outpatient Follow-up:  1. Follow up with PCP in 1-2 weeks 2. Please obtain BMP/CBC in one week Please follow up with orthopedics as recommended.   Discharge Condition:stable.  CODE STATUS:full code.  Diet recommendation: Heart Healthy  Brief/Interim Summary: 77 y.o.malewith medical history significant forCOPD, coronary artery diseases/p cabg,a flutter s/p ablation,chronic diastolic CHF, chronic pain, liver cirrhosis, and chronic kidney disease stage III, now presenting to the emergency department for evaluation of severe left hip pain after a fall. Patient reports that he been in his usual state of health and was walking into a convenient store when he tripped over a wheelchair ramp, landed on his left side, and experienced immediate and severe pain at the left hip. Radiographs reveal impacted fracture of the left femoral neck. admitted for ongoing evaluation and management of the left hip fracture. He underwent left hemiarthroplasty  Discharge Diagnoses:  Principal Problem:   Closed left hip fracture, initial encounter [facility]) Active Problems:   Coronary atherosclerosis   COPD (chronic obstructive pulmonary disease) (HCC)   Chronic back pain   Cirrhosis (HCC)   Second degree AV block, Mobitz type I   Chronic diastolic heart failure (HCC)   CKD (chronic kidney disease) stage 3, GFR 30-59 ml/min (HCC)  Impacted fracture of the left femoral neck Status post left hip arthroplasty by Dr. Alvan Dame. Pain control and physical therapy.  Meanwhile PT evaluation done and recommended SNF placement.    History of chronic back pain Resume home medications. Pain control.     chronic diastolic heart failure He appears compensated. Resume lasix and  spironolactone on discharge.    Stage III CKD  Creatinine appears to be back to baseline today.   COPD No wheezing heard.  Resume inhalers as needed.   History of liver cirrhosis He appears to be compensated.  Resume diuretics on discharge.   History of coronary artery disease status post CABG in 2010 Patient denies any chest pain or shortness of breath at this time. Resume aspirin .   Discharge Instructions  Discharge Instructions    Diet - low sodium heart healthy   Complete by:  As directed    Diet - low sodium heart healthy   Complete by:  As directed    Discharge instructions   Complete by:  As directed    Please follow up with orthopedics as recommended.     Allergies as of 01/29/2018      Reactions   Doxycycline    Amlodipine Nausea And Vomiting, Other (See Comments)   dizziness   Fish Allergy Nausea And Vomiting   STATES HE HAS NOT EATEN ANY FISH INCLUDING SHELLFISH FOR PAST 40 YRS - IT CAUSED NAUSEA   Hydrocodone Itching, Rash   Other Nausea And Vomiting   STATES HE HAS NOT EATEN ANY FISH INCLUDING SHELLFISH FOR PAST 40 YRS - IT CAUSED NAUSEA   Tylenol [acetaminophen] Other (See Comments)   Liver problems      Medication List    STOP taking these medications   aspirin EC 81 MG tablet Replaced by:  aspirin 81 MG chewable tablet   lidocaine 4 % cream Commonly known as:  LMX     TAKE these medications   aspirin 81 MG chewable tablet Chew 1 tablet (81 mg total) by mouth 2 (two) times  daily. Take for 4 weeks, then resume regular dose. Replaces:  aspirin EC 81 MG tablet   docusate sodium 100 MG capsule Commonly known as:  COLACE Take 1 capsule (100 mg total) by mouth 2 (two) times daily.   feeding supplement (ENSURE ENLIVE) Liqd Take 237 mLs by mouth 2 (two) times daily between meals.   ferrous sulfate 325 (65 FE) MG tablet Take 1 tablet (325 mg total) by mouth 3 (three) times daily with meals.   finasteride 5 MG tablet Commonly known  as:  PROSCAR Take 5 mg by mouth daily.   fluticasone 50 MCG/ACT nasal spray Commonly known as:  FLONASE PLACE 1 SPRAY INTO BOTH NOSTRILS EVERY MORNING. What changed:  See the new instructions.   furosemide 40 MG tablet Commonly known as:  LASIX Take 2 pills in the morning and 1 pill in the afternoon. What changed:    how much to take  how to take this  when to take this  additional instructions   guaiFENesin 600 MG 12 hr tablet Commonly known as:  MUCINEX Take 1,200 mg by mouth 2 (two) times daily.   hydrALAZINE 10 MG tablet Commonly known as:  APRESOLINE TAKE 1 TABLET BY MOUTH 3 TIMES A DAY   ibuprofen 200 MG tablet Commonly known as:  ADVIL,MOTRIN Take 400 mg by mouth every 6 (six) hours as needed for moderate pain.   lactulose 10 GM/15ML solution Commonly known as:  CHRONULAC Take 30 mLs (20 g total) by mouth 2 (two) times daily as needed for mild constipation or moderate constipation.   methocarbamol 500 MG tablet Commonly known as:  ROBAXIN Take 1 tablet (500 mg total) by mouth every 6 (six) hours as needed for muscle spasms.   multivitamin Tabs tablet Take 1 tablet by mouth daily.   oxyCODONE HCl 30 MG Taba Take 30 mg by mouth every 4 (four) hours.   polyethylene glycol packet Commonly known as:  MIRALAX / GLYCOLAX Take 17 g by mouth 2 (two) times daily.   potassium chloride SA 20 MEQ tablet Commonly known as:  K-DUR,KLOR-CON Take 2 tablets (40 mEq total) by mouth every evening. KEEP OV. What changed:  additional instructions   pravastatin 20 MG tablet Commonly known as:  PRAVACHOL TAKE 1 TABLET BY MOUTH EVERY DAY   sertraline 25 MG tablet Commonly known as:  ZOLOFT TAKE 1 TABLET BY MOUTH EVERYDAY AT BEDTIME What changed:  See the new instructions.   spironolactone 25 MG tablet Commonly known as:  ALDACTONE TAKE 1 TABLET BY MOUTH EVERY DAY   tamsulosin 0.4 MG Caps capsule Commonly known as:  FLOMAX Take 1 capsule (0.4 mg total) by mouth  daily.   VENTOLIN HFA 108 (90 Base) MCG/ACT inhaler Generic drug:  albuterol TAKE 2 PUFFS BY MOUTH EVERY 6 HOURS AS NEEDED FOR WHEEZE What changed:  See the new instructions.      Follow-up Information    [name], MD. Schedule an appointment as soon as possible for a visit in 2 weeks.   Specialty:  Orthopedic Surgery Contact information: [address] [place] [address] [phone]        [name], MD. Schedule an appointment as soon as possible for a visit in 1 week(s).   Specialty:  Internal Medicine Contact information: [place] [address] [phone]          Allergies  Allergen Reactions  . Doxycycline   . Amlodipine Nausea And Vomiting and Other (See  Comments)    dizziness  . Fish Allergy Nausea And Vomiting    STATES HE HAS NOT EATEN ANY FISH INCLUDING SHELLFISH FOR PAST 40 YRS - IT CAUSED NAUSEA  . Hydrocodone Itching and Rash  . Other Nausea And Vomiting    STATES HE HAS NOT EATEN ANY FISH INCLUDING SHELLFISH FOR PAST 40 YRS - IT CAUSED NAUSEA  . Tylenol [Acetaminophen] Other (See Comments)    Liver problems    Consultations:  Orthopedics.    Procedures/Studies: Mr Brain Wo Contrast  Result Date: 01/11/2018 CLINICAL DATA:  77 y/o M; occipital head pain and neck pain for 6 months. Bilateral arm pain since bypass surgery in 2010. EXAM: MRI HEAD WITHOUT CONTRAST MRI CERVICAL SPINE WITHOUT CONTRAST TECHNIQUE: Multiplanar, multiecho pulse sequences of the brain and surrounding structures, and cervical spine, to include the craniocervical junction and cervicothoracic junction, were obtained without intravenous contrast. COMPARISON:  04/03/2012 CT head. FINDINGS: MRI HEAD FINDINGS Brain: No acute infarction, hemorrhage, hydrocephalus, extra-axial collection or mass lesion. Small chronic lacunar infarct within the right thalamus. Several nonspecific T2 FLAIR hyperintensities in subcortical and  periventricular white matter are compatible with mild chronic microvascular ischemic changes for age. Mild volume loss of the brain. Vascular: Normal flow voids. Skull and upper cervical spine: Normal marrow signal. Sinuses/Orbits: Small right maxillary sinus mucous retention cyst. No abnormal signal of the distal paranasal sinuses or mastoid air cells. Orbits are unremarkable. Other: None. MRI CERVICAL SPINE FINDINGS Alignment: Physiologic. Vertebrae: Mild edema within the left aspect of the anterior arch of C1 in the odontoid process likely related to degenerative arthropathy. No findings of acute fracture or discitis. Cord: Increased central cord signal at the C4 level and C5-6 level with cord volume loss compatible with myelomalacia. Posterior Fossa, vertebral arteries, paraspinal tissues: Negative. Disc levels: C2-3: Moderate disc osteophyte complex and facet hypertrophy greater on the left. Moderate bilateral foraminal stenosis and canal stenosis. Disc contact on the anterior cord with mild cord flattening. C3-4: Disc osteophyte complex with severe uncovertebral as well as left-greater-than-right facet hypertrophy. Severe bilateral foraminal stenosis. Severe canal stenosis with cord impingement. C4-5: Disc osteophyte complex eccentric to the right with right greater than left uncovertebral and facet hypertrophy. Severe right and moderate to severe left foraminal stenosis. Moderate to severe canal stenosis with cord impingement. C5-6: Disc osteophyte complex with extensive uncovertebral and facet hypertrophy. Severe bilateral foraminal stenosis and moderate canal stenosis. Disc impingement of the anterior cord with mild cord flattening. C6-7: Disc osteophyte complex and small right central disc extrusion with caudal migration. Combined with left-greater-than-right uncovertebral and facet hypertrophy there is moderate left and mild right foraminal stenosis as well as mild canal stenosis. The extrusion impinges  the right anterior cord with cord flattening. C7-T1: Mild disc bulge and bilateral facet hypertrophy. Mild bilateral foraminal stenosis. No canal stenosis. IMPRESSION: MRI head: 1. No acute intracranial abnormality identified. 2. Small chronic lacunar infarct in the right thalamus. Mild chronic microvascular ischemic changes and volume loss of the brain for age. MRI cervical spine: 1. Mild edema within left aspect of anterior arch of C1 and odontoid process likely related to degenerative arthropathy. 2. No acute fracture or discitis. 3. 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 4. Severe cervical spondylosis with multilevel disc and facet degenerative changes. 5. Severe C3-4, moderate to severe C4-5, and moderate C5-6 canal stenosis. 6. Anterior cord impingement at C3-4 through C6-7. 7. Multilevel high-grade foraminal stenosis. Electronically Signed  By: Kristine Garbe M.D.   On: 01/11/2018 00:06   Mr Cervical Spine Wo Contrast  Result Date: 01/11/2018 CLINICAL DATA:  77 y/o M; occipital head pain and neck pain for 6 months. Bilateral arm pain since bypass surgery in 2010. EXAM: MRI HEAD WITHOUT CONTRAST MRI CERVICAL SPINE WITHOUT CONTRAST TECHNIQUE: Multiplanar, multiecho pulse sequences of the brain and surrounding structures, and cervical spine, to include the craniocervical junction and cervicothoracic junction, were obtained without intravenous contrast. COMPARISON:  04/03/2012 CT head. FINDINGS: MRI HEAD FINDINGS Brain: No acute infarction, hemorrhage, hydrocephalus, extra-axial collection or mass lesion. Small chronic lacunar infarct within the right thalamus. Several nonspecific T2 FLAIR hyperintensities in subcortical and periventricular white matter are compatible with mild chronic microvascular ischemic changes for age. Mild volume loss of the brain. Vascular: Normal flow voids. Skull and upper cervical spine: Normal  marrow signal. Sinuses/Orbits: Small right maxillary sinus mucous retention cyst. No abnormal signal of the distal paranasal sinuses or mastoid air cells. Orbits are unremarkable. Other: None. MRI CERVICAL SPINE FINDINGS Alignment: Physiologic. Vertebrae: Mild edema within the left aspect of the anterior arch of C1 in the odontoid process likely related to degenerative arthropathy. No findings of acute fracture or discitis. Cord: Increased central cord signal at the C4 level and C5-6 level with cord volume loss compatible with myelomalacia. Posterior Fossa, vertebral arteries, paraspinal tissues: Negative. Disc levels: C2-3: Moderate disc osteophyte complex and facet hypertrophy greater on the left. Moderate bilateral foraminal stenosis and canal stenosis. Disc contact on the anterior cord with mild cord flattening. C3-4: Disc osteophyte complex with severe uncovertebral as well as left-greater-than-right facet hypertrophy. Severe bilateral foraminal stenosis. Severe canal stenosis with cord impingement. C4-5: Disc osteophyte complex eccentric to the right with right greater than left uncovertebral and facet hypertrophy. Severe right and moderate to severe left foraminal stenosis. Moderate to severe canal stenosis with cord impingement. C5-6: Disc osteophyte complex with extensive uncovertebral and facet hypertrophy. Severe bilateral foraminal stenosis and moderate canal stenosis. Disc impingement of the anterior cord with mild cord flattening. C6-7: Disc osteophyte complex and small right central disc extrusion with caudal migration. Combined with left-greater-than-right uncovertebral and facet hypertrophy there is moderate left and mild right foraminal stenosis as well as mild canal stenosis. The extrusion impinges the right anterior cord with cord flattening. C7-T1: Mild disc bulge and bilateral facet hypertrophy. Mild bilateral foraminal stenosis. No canal stenosis. IMPRESSION: MRI head: 1. No acute intracranial  abnormality identified. 2. Small chronic lacunar infarct in the right thalamus. Mild chronic microvascular ischemic changes and volume loss of the brain for age. MRI cervical spine: 1. Mild edema within left aspect of anterior arch of C1 and odontoid process likely related to degenerative arthropathy. 2. No acute fracture or discitis. 3. 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 4. Severe cervical spondylosis with multilevel disc and facet degenerative changes. 5. Severe C3-4, moderate to severe C4-5, and moderate C5-6 canal stenosis. 6. Anterior cord impingement at C3-4 through C6-7. 7. Multilevel high-grade foraminal stenosis. Electronically Signed   By: Kristine Garbe M.D.   On: 01/11/2018 00:06   Dg Pelvis Portable  Result Date: 01/29/2018 CLINICAL DATA:  Status post left hip replacement EXAM: PORTABLE PELVIS 1-2 VIEWS COMPARISON:  01/26/2018 FINDINGS: Pelvic ring is intact. A bladder catheter is noted in place. Left hip replacement is noted in satisfactory position. No acute abnormality is seen. IMPRESSION: Status post left hip replacement.  No acute abnormality noted. Electronically Signed  By: Inez Catalina M.D.   On: 01/29/2018 10:45   Pelvis Portable  Result Date: 01/26/2018 CLINICAL DATA:  Status post left hip hemiarthroplasty. EXAM: PORTABLE PELVIS 1-2 VIEWS COMPARISON:  Radiographs of June 22, 2017. FINDINGS: Left hemiarthroplasty appears to be well situated. Expected postoperative changes are noted in the surrounding soft tissues. No fracture or dislocation is noted. IMPRESSION: Status post left hip hemiarthroplasty. Electronically Signed   By: Marijo Conception, M.D.   On: 01/26/2018 14:11   Dg Chest Port 1 View  Result Date: 01/26/2018 CLINICAL DATA:  Preoperative for fractured hip. EXAM: PORTABLE CHEST 1 VIEW COMPARISON:  01/20/2017 FINDINGS: Postoperative changes in the mediastinum. Shallow inspiration with  elevation of the left hemidiaphragm. Heart size and pulmonary vascularity are normal. No airspace disease or consolidation in the lungs. No blunting of costophrenic angles. No pneumothorax. Mediastinal contours appear intact. Calcification of the aorta. IMPRESSION: No active disease. Electronically Signed   By: Lucienne Capers M.D.   On: 01/26/2018 01:25   Dg Knee Complete 4 Views Right  Result Date: 01/26/2018 CLINICAL DATA:  Fall with knee pain EXAM: RIGHT KNEE - COMPLETE 4+ VIEW COMPARISON:  None. FINDINGS: No evidence of fracture, dislocation, or joint effusion. No evidence of arthropathy or other focal bone abnormality. Soft tissues are unremarkable. IMPRESSION: Negative. Electronically Signed   By: Ulyses Jarred M.D.   On: 01/26/2018 00:39   Dg Hip Unilat W Or W/o Pelvis 2-3 Views Left  Result Date: 01/26/2018 CLINICAL DATA:  Fall EXAM: DG HIP (WITH OR WITHOUT PELVIS) 2-3V LEFT COMPARISON:  None. FINDINGS: There is a impacted fracture of the left femoral neck with mild medial angulation. Femoral head remains situated in the acetabulum. IMPRESSION: Impacted fracture of the left femoral neck. Electronically Signed   By: Ulyses Jarred M.D.   On: 01/26/2018 00:39       Subjective: Pt denies any chest pain or sob.   Discharge Exam: Vitals:   01/28/18 2111 01/29/18 0505  BP: (!) 122/47 (!) 115/58  Pulse: (!) 49 (!) 50  Resp: 16 16  Temp: 98.1 F (36.7 C) 98 F (36.7 C)  SpO2: 94% 96%   Vitals:   01/28/18 1753 01/28/18 2111 01/29/18 0427 01/29/18 0505  BP: (!) 131/50 (!) 122/47  (!) 115/58  Pulse: (!) 56 (!) 49  (!) 50  Resp: 18 16  16   Temp:  98.1 F (36.7 C)  98 F (36.7 C)  TempSrc:  Oral  Oral  SpO2: 93% 94%  96%  Weight:   83.5 kg   Height:        General: Pt is alert, awake, not in acute distress Cardiovascular: RRR, S1/S2 +, no rubs, no gallops Respiratory: CTA bilaterally, no wheezing, no rhonchi Abdominal: Soft, NT, ND, bowel sounds + Extremities: no edema, no  cyanosis    The results of significant diagnostics from this hospitalization (including imaging, microbiology, ancillary and laboratory) are listed below for reference.     Microbiology: No results found for this or any previous visit (from the past 240 hour(s)).   Labs: BNP (last 3 results) No results for input(s): BNP in the last 8760 hours. Basic Metabolic Panel: Recent Labs  Lab 01/26/18 0106 01/26/18 0345 01/27/18 0604 01/28/18 0527 01/29/18 0519  NA 139 140 141 141 138  K 4.5 4.3 5.1 4.1 4.1  CL 101 102 104 107 106  CO2 25 26 28 25 24   GLUCOSE 131* 130* 123* 114* 133*  BUN 37* 38* 37* 37*  35*  CREATININE 2.16* 2.04* 1.81* 1.30* 1.22  CALCIUM 9.3 9.1 8.4* 8.4* 8.4*   Liver Function Tests: Recent Labs  Lab 01/26/18 0106  AST 40  ALT 27  ALKPHOS 82  BILITOT 1.4*  PROT 7.8  ALBUMIN 4.3   No results for input(s): LIPASE, AMYLASE in the last 168 hours. No results for input(s): AMMONIA in the last 168 hours. CBC: Recent Labs  Lab 01/26/18 0106 01/26/18 0345 01/27/18 0604 01/28/18 0527 01/29/18 0519  WBC 9.7 8.7 11.3* 8.0 7.4  NEUTROABS 7.7  --   --   --   --   HGB 13.1 12.6* 10.9* 9.9* 9.7*  HCT 39.5 38.9* 34.8* 32.4* 31.3*  MCV 86.4 86.3 88.1 89.8 89.7  PLT 245 233 198 173 175   Cardiac Enzymes: No results for input(s): CKTOTAL, CKMB, CKMBINDEX, TROPONINI in the last 168 hours. BNP: Invalid input(s): POCBNP CBG: No results for input(s): GLUCAP in the last 168 hours. D-Dimer No results for input(s): DDIMER in the last 72 hours. Hgb A1c No results for input(s): HGBA1C in the last 72 hours. Lipid Profile No results for input(s): CHOL, HDL, LDLCALC, TRIG, CHOLHDL, LDLDIRECT in the last 72 hours. Thyroid function studies No results for input(s): TSH, T4TOTAL, T3FREE, THYROIDAB in the last 72 hours.  Invalid input(s): FREET3 Anemia work up No results for input(s): VITAMINB12, FOLATE, FERRITIN, TIBC, IRON, RETICCTPCT in the last 72  hours. Urinalysis    Component Value Date/Time   COLORURINE YELLOW 01/21/2017 0450   APPEARANCEUR CLEAR 01/21/2017 0450   LABSPEC 1.011 01/21/2017 0450   PHURINE 5.0 01/21/2017 0450   GLUCOSEU NEGATIVE 01/21/2017 0450   HGBUR NEGATIVE 01/21/2017 0450   BILIRUBINUR 3+ 09/16/2017 1617   KETONESUR NEGATIVE 01/21/2017 0450   PROTEINUR Positive (A) 09/16/2017 1617   PROTEINUR NEGATIVE 01/21/2017 0450   UROBILINOGEN 2.0 (A) 09/16/2017 1617   UROBILINOGEN 1.0 07/28/2013 2005   NITRITE pos 09/16/2017 1617   NITRITE NEGATIVE 01/21/2017 0450   LEUKOCYTESUR Large (3+) (A) 09/16/2017 1617   Sepsis Labs Invalid input(s): PROCALCITONIN,  WBC,  LACTICIDVEN Microbiology No results found for this or any previous visit (from the past 240 hour(s)).   Time coordinating discharge: 32  minutes  SIGNED:   Hosie Poisson, MD  [facility] 01/29/2018, 11:08 AM Pager   If 7PM-7AM, please contact night-coverage [email or web address] Password [email or web address]

## 2018-01-31 DIAGNOSIS — I2510 Atherosclerotic heart disease of native coronary artery without angina pectoris: Secondary | ICD-10-CM | POA: Diagnosis not present

## 2018-01-31 DIAGNOSIS — S72002D Fracture of unspecified part of neck of left femur, subsequent encounter for closed fracture with routine healing: Secondary | ICD-10-CM | POA: Diagnosis not present

## 2018-01-31 DIAGNOSIS — K7469 Other cirrhosis of liver: Secondary | ICD-10-CM | POA: Diagnosis not present

## 2018-01-31 DIAGNOSIS — G8929 Other chronic pain: Secondary | ICD-10-CM | POA: Diagnosis not present

## 2018-01-31 DIAGNOSIS — R002 Palpitations: Secondary | ICD-10-CM | POA: Diagnosis not present

## 2018-01-31 DIAGNOSIS — I5032 Chronic diastolic (congestive) heart failure: Secondary | ICD-10-CM | POA: Diagnosis not present

## 2018-02-07 DIAGNOSIS — I2510 Atherosclerotic heart disease of native coronary artery without angina pectoris: Secondary | ICD-10-CM | POA: Diagnosis not present

## 2018-02-07 DIAGNOSIS — R2689 Other abnormalities of gait and mobility: Secondary | ICD-10-CM | POA: Diagnosis not present

## 2018-02-07 DIAGNOSIS — M545 Low back pain: Secondary | ICD-10-CM | POA: Diagnosis not present

## 2018-02-07 DIAGNOSIS — S72002D Fracture of unspecified part of neck of left femur, subsequent encounter for closed fracture with routine healing: Secondary | ICD-10-CM | POA: Diagnosis not present

## 2018-02-09 DIAGNOSIS — R3912 Poor urinary stream: Secondary | ICD-10-CM | POA: Diagnosis not present

## 2018-02-09 DIAGNOSIS — R310 Gross hematuria: Secondary | ICD-10-CM | POA: Diagnosis not present

## 2018-02-09 DIAGNOSIS — R972 Elevated prostate specific antigen [PSA]: Secondary | ICD-10-CM | POA: Diagnosis not present

## 2018-02-09 DIAGNOSIS — N401 Enlarged prostate with lower urinary tract symptoms: Secondary | ICD-10-CM | POA: Diagnosis not present

## 2018-02-11 ENCOUNTER — Ambulatory Visit: Payer: Self-pay

## 2018-02-11 ENCOUNTER — Ambulatory Visit (INDEPENDENT_AMBULATORY_CARE_PROVIDER_SITE_OTHER): Payer: Medicare Other | Admitting: Family Medicine

## 2018-02-11 ENCOUNTER — Encounter: Payer: Self-pay | Admitting: Family Medicine

## 2018-02-11 VITALS — BP 130/60 | HR 58 | Ht 71.0 in | Wt 172.0 lb

## 2018-02-11 DIAGNOSIS — I872 Venous insufficiency (chronic) (peripheral): Secondary | ICD-10-CM | POA: Diagnosis not present

## 2018-02-11 DIAGNOSIS — I5032 Chronic diastolic (congestive) heart failure: Secondary | ICD-10-CM | POA: Diagnosis not present

## 2018-02-11 DIAGNOSIS — I6523 Occlusion and stenosis of bilateral carotid arteries: Secondary | ICD-10-CM

## 2018-02-11 DIAGNOSIS — S72009A Fracture of unspecified part of neck of unspecified femur, initial encounter for closed fracture: Secondary | ICD-10-CM | POA: Insufficient documentation

## 2018-02-11 NOTE — Patient Instructions (Addendum)
Please elevate left leg to improve swelling. Compression stockings can help with swelling. Try to wear these and avoid foods that contain salt.   Take one extra spironolactone tablet for a total of 2 tablets daily.  Follow up with Dr. Sharlet Salina on Monday as scheduled.  If symptoms worsen or new symptoms develop, seek medical attention immediately.

## 2018-02-11 NOTE — Telephone Encounter (Signed)
Outgoing call to patient. Patient states that he fell and broke his hip 2 weeks ago.  Had surgery,was placed in [place] for physical therapy.  Discharged on Friday.  States that he has a follow appointment with Orthopedic Surgeon today.  Would like an appointment with Dr.  Sharlet Salina.  Patient complains of bilateral leg swelling. Patient states he was wearing compression stockings  7 to 8 hours per day until  Monday.  They were causing pain.  States he does take his two diuretics every day.  Reports having COPD.  States it is stable.  Patient has an appointment with Dr.  Sharlet Salina Monday 02/15/18. Was wondering with bilateral  Leg swelling should he come in sooner.  Triaged patient:  Patient states swelling started Monday Bilaterally.  Left one worse than right.  Patient rates it " borderline severe".  Notes redness near shin area bilaterally.  Denies fever.  Patient states he has a history of heart failure , liver fever and kidney disease.  The leg swelling has occurred before.  Denies chest pain orr difficulty breathing. Patient scheduled for appointment with Rodman Comp, PA  Today, @ 1:00pm.  Patient voiced understanding.  Provided care advise.  Voiced understanding.   Reason for Disposition . [1] MODERATE leg swelling (e.g., swelling extends up to knees) AND [2] new onset or worsening  Answer Assessment - Initial Assessment Questions 1. ONSET: "When did the swelling start?" (e.g., minutes, hours, days)     Monday 2. LOCATION: "What part of the leg is swollen?"  "Are both legs swollen or just one leg?"     Both, left one worse 3. SEVERITY: "How bad is the swelling?" (e.g., localized; mild, moderate, severe)  - Localized - small area of swelling localized to one leg  - MILD pedal edema - swelling limited to foot and ankle, pitting edema < 1/4 inch (6 mm) deep, rest and elevation eliminate most or all swelling  - MODERATE edema - swelling of lower leg to knee, pitting edema > 1/4 inch (6  mm) deep, rest and elevation only partially reduce swelling  - SEVERE edema - swelling extends above knee, facial or hand swelling present      Borderline sever 4. REDNESS: "Does the swelling look red or infected?"     Little  Redness shin , left,  Near ankle bilaterally 5. PAIN: "Is the swelling painful to touch?" If so, ask: "How painful is it?"   (Scale 1-10; mild, moderate or severe)     Taking oxycodone 6. FEVER: "Do you have a fever?" If so, ask: "What is it, how was it measured, and when did it start?"      no 7. CAUSE: "What do you think is causing the leg swelling?"     ?? 8. MEDICAL HISTORY: "Do you have a history of heart failure, kidney disease, liver failure, or cancer?"     All of the above ciross 9. RECURRENT SYMPTOM: "Have you had leg swelling before?" If so, ask: "When was the last time?" "What happened that time?"     yes 10. OTHER SYMPTOMS: "Do you have any other symptoms?" (e.g., chest pain, difficulty breathing)       no 11. PREGNANCY: "Is there any chance you are pregnant?" "When was your last menstrual period?"       na  Protocols used: LEG SWELLING AND EDEMA-A-AH

## 2018-02-11 NOTE — Progress Notes (Signed)
Subjective:    Patient ID: [name], male    DOB: [birth year], 77 y.o.   MRN: [id number]  HPI  [name] is a 77 year old male who presents today with leg edema that has been present for 4 days.  Left lower extremity edema is worse than right per patient.  He has a history of chronic lower extremity edema and has noticed worsening of his symptoms since discharge from nursing facility for PT therapy He states that erythemas has been present bilaterally. On 01/25/18 he sustained a hip fracture, surgery, and was placed in a nursing facility for PT. He was discharged on 02/09/18 and has a follow up appointment with orthopedic surgery today.  He has not been wearing his compression stockings on a regular basis but  tried to use compression stockings yesterday, wearing 12 hours and removed them due to pain.   He reports that he stopped because they were causing discomfort.   He is taking medication as directed and has a history of COPD which he reports is unchanged.  He has experienced similar symptoms before that was likely due to fluid overload. Prior treatment of increasing lasix has resolved his symptoms. He has been on lasix long term.  History of HTN, COPD, Chronic diastolic heart failure, and cirrhosis.  He is taking 2 (40 mg ) of Lasix in the AM and one tablet (25 mg) of spironolactone in the AM. He is taking potassium one tablet (40 mg) daily.  Edema presented: 4 days  Fever: No Pain: No worsening pain, he reports chronic pain has remained unchanged Erythema:  Warmth: No Dyspnea: History of COPD, no worsening SOB Orthopnea: No PND: No Fatigue: No Anorexia: No Abdominal pain: No Pertinent History: HTN, Chronic Diastolic Heart Failure   He denies much salt intake but does eat prepared/processed foods.   Review of Systems  Constitutional: Negative for chills, fatigue and fever.  Respiratory: Negative for cough, shortness of breath and wheezing.   Cardiovascular:  Positive for leg swelling. Negative for chest pain and palpitations.  Gastrointestinal: Negative for abdominal pain.  Genitourinary: Negative for dysuria.  Musculoskeletal: Positive for arthralgias and gait problem.  Neurological: Negative for dizziness, light-headedness, numbness and headaches.  Psychiatric/Behavioral:       Denies depressed or anxious mood today   Past Medical History:  Diagnosis Date  . AAA (abdominal aortic aneurysm) ([place])   . Blindness of left eye   . 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 CAD (coronary artery disease)    a. s/p CABG in 10/2008 with LIMA-LAD, SVG-OM1 with Y-graft to PL branch, and SVG-RCA  . Carotid stenosis   . Cerebrovascular disease   . CHF (congestive heart failure) ([place])   . Cirrhosis ([place])    on CT a/p 07/2013, no longer drinking  . CONGENITAL UNSPEC REDUCTION DEFORMITY LOWER LIMB   . Constipation due to opioid therapy 2014   began about a year ago  . COPD (chronic obstructive pulmonary disease) ([place])   . Foley catheter in place   . HTN (hypertension)   . Hyperlipidemia   . 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 Neuromuscular disorder ([place])    PT STATES HE HAS BEEN TOLD HE EITHER HAD POLIO OR CEREBRAL PALSY - STATES HIS LEFT LEG IS SHORTER AND AFFECTS HIS BALANCE - HE WEARS ELEVATED SHOE -LEFT  . Pain    BACK, LEGS AND  ARMS  . Shortness of breath    EXERTION  . TOBACCO USE, QUIT      Social History   Socioeconomic History  . Marital status: Widowed    Spouse name: Not on file  . Number of children: Not on file  . Years of education: Not on file  . Highest education level: Not on file  Occupational History  . Not on file  Social Needs  . Financial resource strain: Not on file  . Food insecurity:    Worry: Not on file    Inability: Not on file  . Transportation needs:    Medical: Not on file    Non-medical: Not  on file  Tobacco Use  . Smoking status: Former Research scientist (life sciences)  . Smokeless tobacco: Never Used  Substance and Sexual Activity  . Alcohol use: No    Alcohol/week: 0.0 standard drinks    Comment: History of heavy alcohol use per pt. Quit many years ago1/1/ 2005  . Drug use: No  . Sexual activity: Never  Lifestyle  . Physical activity:    Days per week: Not on file    Minutes per session: Not on file  . Stress: Not on file  Relationships  . Social connections:    Talks on phone: Not on file    Gets together: Not on file    Attends religious service: Not on file    Active member of club or organization: Not on file    Attends meetings of clubs or organizations: Not on file    Relationship status: Not on file  . Intimate partner violence:    Fear of current or ex partner: Not on file    Emotionally abused: Not on file    Physically abused: Not on file    Forced sexual activity: Not on file  Other Topics Concern  . Not on file  Social History Narrative   lives alone here in [place].  He has 1 son, who lives in the [place], and he has     an uncle, who lives nearby as well.     Past Surgical History:  Procedure Laterality Date  . CORONARY ARTERY BYPASS GRAFT  11/03/2008   Ricard Dillon - x4: left internal mammary artery to the distal left anterior descending, saphemous vein graft to the first circumflex marginal branch with a Y graft sequentiallly to a left posterolateral branch, saphenous vein graft to the distal right coronary artery  . ELECTROPHYSIOLOGIC STUDY N/A 01/18/2015   Procedure: A-Flutter Ablation;  Surgeon: Deboraha Sprang, MD;  Location: [facility];  Service: Cardiovascular;  Laterality: N/A;  . GREEN LIGHT LASER TURP (TRANSURETHRAL RESECTION OF PROSTATE N/A 02/14/2014   Procedure: GREEN LIGHT LASER TURP (TRANSURETHRAL RESECTION OF PROSTATE;  Surgeon: Festus Aloe, MD;  Location: [facility];  Service: Urology;  Laterality: N/A;  . HIP ARTHROPLASTY Left 01/26/2018    Procedure: LEFT HIP POSTERIOR HEMIARTHROPLASTY;  Surgeon: [name], MD;  Location: [facility];  Service: Orthopedics;  Laterality: Left;    Family History  Problem Relation Age of Onset  . Cirrhosis Mother        died at 87  . Colon cancer Neg Hx   . Stomach cancer Neg Hx     Allergies  Allergen Reactions  . Doxycycline   . Amlodipine Nausea And Vomiting and Other (See Comments)    dizziness  . Fish Allergy Nausea And Vomiting    STATES HE HAS NOT EATEN ANY FISH INCLUDING SHELLFISH FOR  PAST 40 YRS - IT CAUSED NAUSEA  . Hydrocodone Itching and Rash  . Other Nausea And Vomiting    STATES HE HAS NOT EATEN ANY FISH INCLUDING SHELLFISH FOR PAST 40 YRS - IT CAUSED NAUSEA  . Tylenol [Acetaminophen] Other (See Comments)    Liver problems    Current Outpatient Medications on File Prior to Visit  Medication Sig Dispense Refill  . aspirin (ASPIRIN CHILDRENS) 81 MG chewable tablet Chew 1 tablet (81 mg total) by mouth 2 (two) times daily. Take for 4 weeks, then resume regular dose. 60 tablet 0  . docusate sodium (COLACE) 100 MG capsule Take 1 capsule (100 mg total) by mouth 2 (two) times daily. 10 capsule 0  . feeding supplement, ENSURE ENLIVE, (ENSURE ENLIVE) LIQD Take 237 mLs by mouth 2 (two) times daily between meals. 237 mL 12  . ferrous sulfate (FERROUSUL) 325 (65 FE) MG tablet Take 1 tablet (325 mg total) by mouth 3 (three) times daily with meals.  3  . finasteride (PROSCAR) 5 MG tablet Take 5 mg by mouth daily.    . fluticasone (FLONASE) 50 MCG/ACT nasal spray PLACE 1 SPRAY INTO BOTH NOSTRILS EVERY MORNING. (Patient taking differently: Place 1 spray into both nostrils daily. ) 48 g 1  . furosemide (LASIX) 40 MG tablet Take 2 pills in the morning and 1 pill in the afternoon. (Patient taking differently: Take 80 mg by mouth daily. ) 90 tablet 6  . guaiFENesin (MUCINEX) 600 MG 12 hr tablet Take 1,200 mg by mouth 2 (two) times daily.     . hydrALAZINE (APRESOLINE) 10 MG tablet TAKE 1 TABLET  BY MOUTH 3 TIMES A DAY (Patient taking differently: Take 10 mg by mouth 3 (three) times daily. ) 270 tablet 1  . ibuprofen (ADVIL,MOTRIN) 200 MG tablet Take 400 mg by mouth every 6 (six) hours as needed for moderate pain.    [name] lactulose (CHRONULAC) 10 GM/15ML solution Take 30 mLs (20 g total) by mouth 2 (two) times daily as needed for mild constipation or moderate constipation. 1892 mL 6  . methocarbamol (ROBAXIN) 500 MG tablet Take 1 tablet (500 mg total) by mouth every 6 (six) hours as needed for muscle spasms. 40 tablet 0  . multivitamin (PROSIGHT) TABS tablet Take 1 tablet by mouth daily. 30 each 0  . oxyCODONE HCl 30 MG TABA Take 30 mg by mouth every 4 (four) hours.    . polyethylene glycol (MIRALAX / GLYCOLAX) packet Take 17 g by mouth 2 (two) times daily. 14 each 0  . potassium chloride SA (KLOR-CON M20) 20 MEQ tablet Take 2 tablets (40 mEq total) by mouth every evening. KEEP OV. (Patient taking differently: Take 40 mEq by mouth every evening. ) 180 tablet 0  . pravastatin (PRAVACHOL) 20 MG tablet TAKE 1 TABLET BY MOUTH EVERY DAY (Patient taking differently: Take 20 mg by mouth daily. ) 90 tablet 3  . sertraline (ZOLOFT) 25 MG tablet TAKE 1 TABLET BY MOUTH EVERYDAY AT BEDTIME (Patient taking differently: Take 25 mg by mouth daily as needed. Helps with sleep) 30 tablet 3  . spironolactone (ALDACTONE) 25 MG tablet TAKE 1 TABLET BY MOUTH EVERY DAY (Patient taking differently: Take 25 mg by mouth daily. ) 30 tablet 3  . tamsulosin (FLOMAX) 0.4 MG CAPS capsule Take 1 capsule (0.4 mg total) by mouth daily. 90 capsule 1  . VENTOLIN HFA 108 (90 Base) MCG/ACT inhaler TAKE 2 PUFFS BY MOUTH EVERY 6 HOURS AS NEEDED FOR WHEEZE (Patient taking  differently: Inhale 2 puffs into the lungs every 6 (six) hours as needed for wheezing. ) 54 Inhaler 1   No current facility-administered medications on file prior to visit.     BP 130/60 (BP Location: Left Arm, Patient Position: Sitting, Cuff Size: Normal)   Pulse  (!) 58   Ht 5\' 11"  (1.803 m)   Wt 172 lb (78 kg)   SpO2 98%   BMI 23.99 kg/m       Objective:   Physical Exam  Constitutional: He is oriented to person, place, and time. He appears well-developed and well-nourished.  Presenting in a wheelchair due to recent surgery  Eyes: Pupils are equal, round, and reactive to light. No scleral icterus.  Neck: Neck supple.  Cardiovascular: Normal rate, normal heart sounds and intact distal pulses.  Pulmonary/Chest: Effort normal and breath sounds normal. He has no wheezes. He has no rales.  Abdominal: Soft. Bowel sounds are normal. There is no tenderness.  Musculoskeletal: He exhibits edema.  Pitting edema in lower legs bilaterally to mid shin, 1-2+. Mild color change of the skin on lower legs bilaterally. Left edema and color change > right lower leg. No warmth, fluctuance, or ulcerations present.     Neurological: He is alert and oriented to person, place, and time.  Skin: Skin is warm and dry.  Psychiatric: He has a normal mood and affect. His behavior is normal. Judgment and thought content normal.        Assessment & Plan:  1. Chronic diastolic heart failure (HCC) History of cirrhosis and heart failure are present. Edema has been a chronic issue that is not likely to have full resolution. He is feeling well overall and symptoms have worsened after he has been unable to elevate feet and has not been wearing compression stockings.  Advised elevation, compression stockings and will add one tablet 25 mg of spironolactone to regimen and he will follow up with PCP in 4 days. We discussed the importance of close monitoring and if symptoms worsen, he will seek medical attention immediately. Also advised him of the Saturday clinic that is available if needed. No wounds present nor signs of infection today.  Advised low salt diet and discussed that canned foods are high in sodium.   Reviewed case with PCP who will follow up with him in 4 days. She is  in agreement with plan.   Delano Metz, FNP-C  2. Venous insufficiency Advised elevation and use of compression stockings. He will follow up with PCP in 4 days or sooner if needed.   Delano Metz, FNP-C

## 2018-02-13 ENCOUNTER — Other Ambulatory Visit: Payer: Self-pay | Admitting: Internal Medicine

## 2018-02-15 ENCOUNTER — Encounter: Payer: Self-pay | Admitting: Internal Medicine

## 2018-02-15 ENCOUNTER — Other Ambulatory Visit (INDEPENDENT_AMBULATORY_CARE_PROVIDER_SITE_OTHER): Payer: Medicare Other

## 2018-02-15 ENCOUNTER — Ambulatory Visit (INDEPENDENT_AMBULATORY_CARE_PROVIDER_SITE_OTHER): Payer: Medicare Other | Admitting: Internal Medicine

## 2018-02-15 VITALS — BP 110/60 | HR 68 | Temp 97.9°F | Ht 71.0 in

## 2018-02-15 DIAGNOSIS — J42 Unspecified chronic bronchitis: Secondary | ICD-10-CM | POA: Diagnosis not present

## 2018-02-15 DIAGNOSIS — N179 Acute kidney failure, unspecified: Secondary | ICD-10-CM

## 2018-02-15 DIAGNOSIS — D649 Anemia, unspecified: Secondary | ICD-10-CM | POA: Diagnosis not present

## 2018-02-15 DIAGNOSIS — N183 Chronic kidney disease, stage 3 unspecified: Secondary | ICD-10-CM

## 2018-02-15 DIAGNOSIS — S72002A Fracture of unspecified part of neck of left femur, initial encounter for closed fracture: Secondary | ICD-10-CM

## 2018-02-15 DIAGNOSIS — I6523 Occlusion and stenosis of bilateral carotid arteries: Secondary | ICD-10-CM | POA: Diagnosis not present

## 2018-02-15 DIAGNOSIS — I5032 Chronic diastolic (congestive) heart failure: Secondary | ICD-10-CM | POA: Diagnosis not present

## 2018-02-15 LAB — COMPREHENSIVE METABOLIC PANEL
ALT: 16 U/L (ref 0–53)
AST: 21 U/L (ref 0–37)
Albumin: 3.7 g/dL (ref 3.5–5.2)
Alkaline Phosphatase: 89 U/L (ref 39–117)
BUN: 43 mg/dL — ABNORMAL HIGH (ref 6–23)
CO2: 23 mEq/L (ref 19–32)
Calcium: 9 mg/dL (ref 8.4–10.5)
Chloride: 101 mEq/L (ref 96–112)
Creatinine, Ser: 1.74 mg/dL — ABNORMAL HIGH (ref 0.40–1.50)
GFR: 40.83 mL/min — ABNORMAL LOW (ref >60.00)
Glucose, Bld: 102 mg/dL — ABNORMAL HIGH (ref 70–99)
Potassium: 3.8 mEq/L (ref 3.5–5.1)
Sodium: 135 mEq/L (ref 135–145)
Total Bilirubin: 0.9 mg/dL (ref 0.2–1.2)
Total Protein: 7.2 g/dL (ref 6.0–8.3)

## 2018-02-15 LAB — CBC
HCT: 30.4 % — ABNORMAL LOW (ref 39.0–52.0)
Hemoglobin: 10.5 g/dL — ABNORMAL LOW (ref 13.0–17.0)
MCHC: 34.4 g/dL (ref 30.0–36.0)
MCV: 83.5 fl (ref 78.0–100.0)
Platelets: 297 K/uL (ref 150.0–400.0)
RBC: 3.64 Mil/uL — ABNORMAL LOW (ref 4.22–5.81)
RDW: 16.2 % — ABNORMAL HIGH (ref 11.5–15.5)
WBC: 6.1 K/uL (ref 4.0–10.5)

## 2018-02-15 MED ORDER — GUAIFENESIN ER 1200 MG PO TB12: 1200.0000 mg | ORAL_TABLET | Freq: Two times a day (BID) | ORAL | 3 refills | Status: DC

## 2018-02-15 NOTE — Progress Notes (Signed)
   Subjective:    Patient ID: [name], male    DOB: [birth year], 77 y.o.   MRN: [id number]  HPI The patient is a 77 YO man coming in for hospital follow up (in for femur fracture and hip surgery to fix, is still using wheelchair, some swelling post operatively and seen with increase in spironolactone to help). He has been taking 2 pills spironolactone daily since visit 4 days ago. This has not seemed to affect his leg swelling much. Still more swelling in the left leg than the right. He is still having his chronic pain as well as pain post operatively. He is still using walkers and wheelchairs to get around and has a mobile home so he has several in different rooms due to narrow doors they cannot get through. He denies chest pains or SOB. He is having some swelling in the left leg. He denies change in diet although admits it is not ideal for salt. But this is not a change from usual. He does have mild swelling in both legs. No weeping or skin breakdown. Denies abdominal pain. Chronic constipation still present. No diarrhea. No fevers or chills.   PMH, [facility], social history reviewed and updated.   Review of Systems  Constitutional: Positive for activity change. Negative for appetite change, fatigue, fever and unexpected weight change.  HENT: Negative.   Eyes: Negative.   Respiratory: Positive for cough and shortness of breath. Negative for chest tightness.   Cardiovascular: Positive for leg swelling. Negative for chest pain and palpitations.  Gastrointestinal: Negative for abdominal distention, abdominal pain, constipation, diarrhea, nausea and vomiting.  Musculoskeletal: Positive for arthralgias and back pain.  Skin: Negative.   Neurological: Negative.   Psychiatric/Behavioral: Negative.       Objective:   Physical Exam  Constitutional: He is oriented to person, place, and time. He appears well-developed and well-nourished.  HENT:  Head: Normocephalic and atraumatic.  Eyes: EOM are  normal.  Neck: Normal range of motion.  Cardiovascular: Normal rate and regular rhythm.  Pulmonary/Chest: Effort normal. No respiratory distress. He has no wheezes. He has no rales.  Lung exam stable from prior  Abdominal: Soft. Bowel sounds are normal. He exhibits no distension. There is no tenderness. There is no rebound.  Musculoskeletal: He exhibits no edema.  Some tight edema left lower leg which is stable from prior, right leg with some edema but not tight.   Neurological: He is alert and oriented to person, place, and time. Coordination abnormal.  Wheelchair in the office and walker at home  Skin: Skin is warm and dry.  Psychiatric: He has a normal mood and affect.   Vitals:   02/15/18 1325  BP: 110/60  Pulse: 68  Temp: 97.9 F (36.6 C)  TempSrc: Oral  SpO2: 97%  Height: 5\' 11"  (1.803 m)      Assessment & Plan:

## 2018-02-15 NOTE — Patient Instructions (Addendum)
We will change to 3 lasix 1 spironolactone 2 potassium daily for 1 week.   Then go back to 2 lasix, 1 spironolactone, 1 potassium daily after that.   I suspect some of the swelling is coming from the surgery not heart failure.

## 2018-02-16 ENCOUNTER — Encounter: Payer: Self-pay | Admitting: Internal Medicine

## 2018-02-16 DIAGNOSIS — N179 Acute kidney failure, unspecified: Secondary | ICD-10-CM

## 2018-02-16 DIAGNOSIS — N183 Chronic kidney disease, stage 3 (moderate): Principal | ICD-10-CM

## 2018-02-16 NOTE — Assessment & Plan Note (Signed)
Will change spironolactone to 25 mg daily and lasix 3 pills daily for 1 week then back to spironolactone 25 mg daily and 2 pills lasix daily. Suspect some of the increase in left leg swelling is post operative rather than flare of CHF and advised him to wear compression stockings as much of the time as possible and that it may take months for swelling to resolve.

## 2018-02-16 NOTE — Assessment & Plan Note (Signed)
Post op and following with orthopedics. He seems to be healing well and ambulating well. Pain is managed well overall.

## 2018-02-16 NOTE — Assessment & Plan Note (Signed)
Checking CMP today as diuretics have been changed recently.

## 2018-02-16 NOTE — Assessment & Plan Note (Signed)
No flare today, rx for mucinex high dose to take at home twice a day to help with mucus. Lung exam stable today.

## 2018-02-16 NOTE — Assessment & Plan Note (Signed)
Post operative anemia in the hospital and checking CBC to make sure this is improving.

## 2018-02-22 ENCOUNTER — Other Ambulatory Visit (INDEPENDENT_AMBULATORY_CARE_PROVIDER_SITE_OTHER): Payer: Medicare Other

## 2018-02-22 DIAGNOSIS — N183 Chronic kidney disease, stage 3 (moderate): Secondary | ICD-10-CM

## 2018-02-22 DIAGNOSIS — N179 Acute kidney failure, unspecified: Secondary | ICD-10-CM

## 2018-02-22 LAB — BASIC METABOLIC PANEL
BUN: 45 mg/dL — ABNORMAL HIGH (ref 6–23)
CO2: 23 mEq/L (ref 19–32)
Calcium: 9 mg/dL (ref 8.4–10.5)
Chloride: 106 mEq/L (ref 96–112)
Creatinine, Ser: 1.94 mg/dL — ABNORMAL HIGH (ref 0.40–1.50)
GFR: 36.01 mL/min — ABNORMAL LOW (ref >60.00)
Glucose, Bld: 137 mg/dL — ABNORMAL HIGH (ref 70–99)
Potassium: 4.4 mEq/L (ref 3.5–5.1)
Sodium: 140 mEq/L (ref 135–145)

## 2018-02-22 NOTE — Progress Notes (Signed)
HPI: FU CAD; s/p NSTEMI followed by CABG 10/2008 (L-LAD, S-OM1/L PL br, S-RCA), AAA, carotid stenosis. Beta blocker previously held due to Mobitz Type 1. Patient had atrial flutter ablation in September 2016. Dr. Caryl Comes ordered a Holter monitor that showed significant pauses and heart block but patient was asleep and conservative management felt indicated. Since last seen,  patient recently fell with a hip fracture.  He is in rehab and slowly recovering.  He has chronic dyspnea on exertion and mild orthopnea.  No PND or pedal edema.  No chest pain or syncope.  Studies: - LHC (09/2008): 3 v CAD, EF 65% =>CABG  - Echo (10/17): Normal LV function, Mild aortic stenosis with mean gradient 11 mmHg, mild left atrial enlargement. - Carotid US (07/20/00): RICA 72-53%; LICA 6-64% - f/u 2 years  - Abdominal Ultrasound  12/10/17 showed dilatation of 3 cm. Follow-up recommended 1 year.  Current Outpatient Medications  Medication Sig Dispense Refill  . aspirin 81 MG tablet Take 81 mg by mouth daily.    . finasteride (PROSCAR) 5 MG tablet Take 5 mg by mouth daily.    . fluticasone (FLONASE) 50 MCG/ACT nasal spray PLACE 1 SPRAY INTO BOTH NOSTRILS EVERY MORNING. (Patient taking differently: Place 1 spray into both nostrils daily. ) 48 g 1  . furosemide (LASIX) 40 MG tablet TAKE 2 PILLS IN THE MORNING AND 1 PILL IN THE AFTERNOON. (Patient taking differently: Take 80 mg by mouth every morning. ) 90 tablet 6  . Guaifenesin 1200 MG TB12 Take 1 tablet (1,200 mg total) by mouth 2 (two) times daily. 180 each 3  . hydrALAZINE (APRESOLINE) 10 MG tablet Take 1 tablet (10 mg total) by mouth 3 (three) times daily. 90 tablet 1  . ibuprofen (ADVIL,MOTRIN) 200 MG tablet Take 400 mg by mouth every 6 (six) hours as needed for moderate pain.    [name] lactulose (CHRONULAC) 10 GM/15ML solution Take 30 mLs (20 g total) by mouth 2 (two) times daily as needed for mild constipation or moderate constipation. 1892 mL 6  . oxyCODONE  HCl 30 MG TABA Take 30 mg by mouth every 4 (four) hours.    . potassium chloride SA (KLOR-CON M20) 20 MEQ tablet Take 2 tablets (40 mEq total) by mouth every evening. KEEP OV. (Patient taking differently: Take 20 mEq by mouth daily. ) 180 tablet 0  . pravastatin (PRAVACHOL) 20 MG tablet TAKE 1 TABLET BY MOUTH EVERY DAY (Patient taking differently: Take 20 mg by mouth daily. ) 90 tablet 3  . sertraline (ZOLOFT) 25 MG tablet TAKE 1 TABLET BY MOUTH EVERYDAY AT BEDTIME (Patient taking differently: Take 25 mg by mouth daily as needed. Helps with sleep) 30 tablet 3  . spironolactone (ALDACTONE) 25 MG tablet TAKE 1 TABLET BY MOUTH EVERY DAY (Patient taking differently: Take 25 mg by mouth daily. ) 30 tablet 3  . tamsulosin (FLOMAX) 0.4 MG CAPS capsule Take 1 capsule (0.4 mg total) by mouth daily. 90 capsule 1  . VENTOLIN HFA 108 (90 Base) MCG/ACT inhaler TAKE 2 PUFFS BY MOUTH EVERY 6 HOURS AS NEEDED FOR WHEEZE (Patient taking differently: Inhale 2 puffs into the lungs every 6 (six) hours as needed for wheezing. ) 54 Inhaler 1   No current facility-administered medications for this visit.      Past Medical History:  Diagnosis Date  . AAA (abdominal aortic aneurysm) ([place])   . Blindness of left eye   . 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 CAD (coronary artery disease)    a. s/p CABG in 10/2008 with LIMA-LAD, SVG-OM1 with Y-graft to PL branch, and SVG-RCA  . Carotid stenosis   . Cerebrovascular disease   . CHF (congestive heart failure) ([place])   . Cirrhosis ([place])    on CT a/p 07/2013, no longer drinking  . CONGENITAL UNSPEC REDUCTION DEFORMITY LOWER LIMB   . Constipation due to opioid therapy 2014   began about a year ago  . COPD (chronic obstructive pulmonary disease) ([place])   . Foley catheter in place   . HTN (hypertension)   . Hyperlipidemia   . 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  Neuromuscular disorder ([place])    PT STATES HE HAS BEEN TOLD HE EITHER HAD POLIO OR CEREBRAL PALSY - STATES HIS LEFT LEG IS SHORTER AND AFFECTS HIS BALANCE - HE WEARS ELEVATED SHOE -LEFT  . Pain    BACK, LEGS AND ARMS  . Shortness of breath    EXERTION  . TOBACCO USE, QUIT     Past Surgical History:  Procedure Laterality Date  . CORONARY ARTERY BYPASS GRAFT  11/03/2008   Ricard Dillon - x4: left internal mammary artery to the distal left anterior descending, saphemous vein graft to the first circumflex marginal branch with a Y graft sequentiallly to a left posterolateral branch, saphenous vein graft to the distal right coronary artery  . ELECTROPHYSIOLOGIC STUDY N/A 01/18/2015   Procedure: A-Flutter Ablation;  Surgeon: Deboraha Sprang, MD;  Location: [facility];  Service: Cardiovascular;  Laterality: N/A;  . GREEN LIGHT LASER TURP (TRANSURETHRAL RESECTION OF PROSTATE N/A 02/14/2014   Procedure: GREEN LIGHT LASER TURP (TRANSURETHRAL RESECTION OF PROSTATE;  Surgeon: Festus Aloe, MD;  Location: [facility];  Service: Urology;  Laterality: N/A;  . HIP ARTHROPLASTY Left 01/26/2018   Procedure: LEFT HIP POSTERIOR HEMIARTHROPLASTY;  Surgeon: [name], MD;  Location: [facility];  Service: Orthopedics;  Laterality: Left;    Social History   Socioeconomic History  . Marital status: Widowed    Spouse name: Not on file  . Number of children: Not on file  . Years of education: Not on file  . Highest education level: Not on file  Occupational History  . Not on file  Social Needs  . Financial resource strain: Not on file  . Food insecurity:    Worry: Not on file    Inability: Not on file  . Transportation needs:    Medical: Not on file    Non-medical: Not on file  Tobacco Use  . Smoking status: Former Research scientist (life sciences)  . Smokeless tobacco: Never Used  Substance and Sexual Activity  . Alcohol use: No    Alcohol/week: 0.0 standard drinks    Comment: History of heavy alcohol use per pt. Quit many years  ago1/1/ 2005  . Drug use: No  . Sexual activity: Never  Lifestyle  . Physical activity:    Days per week: Not on file    Minutes per session: Not on file  . Stress: Not on file  Relationships  . Social connections:    Talks on phone: Not on file    Gets together: Not on file    Attends religious service: Not on file    Active member of club or organization: Not on file    Attends meetings of clubs or organizations: Not on file    Relationship status: Not on  file  . Intimate partner violence:    Fear of current or ex partner: Not on file    Emotionally abused: Not on file    Physically abused: Not on file    Forced sexual activity: Not on file  Other Topics Concern  . Not on file  Social History Narrative   lives alone here in [place].  He has 1 son, who lives in the [place], and he has     an uncle, who lives nearby as well.     Family History  Problem Relation Age of Onset  . Cirrhosis Mother        died at 80  . Colon cancer Neg Hx   . Stomach cancer Neg Hx     ROS: no fevers or chills, productive cough, hemoptysis, dysphasia, odynophagia, melena, hematochezia, dysuria, hematuria, rash, seizure activity, orthopnea, PND, pedal edema, claudication. Remaining systems are negative.  Physical Exam: Well-developed well-nourished in no acute distress.  Skin is warm and dry.  Ecchymosis noted. HEENT is normal.  Neck is supple.  Chest is clear to auscultation with normal expansion.  Cardiovascular exam is regular rate and rhythm. 2/6 systolic murmur; S2 not diminished Abdominal exam nontender or distended. No masses palpated. Extremities show no edema. neuro grossly intact  A/P  1 coronary artery disease-patient denies chest pain.  Continue medical therapy with aspirin and statin.  2 abdominal aortic aneurysm-follow-up abdominal ultrasound August 2020.  3 aortic stenosis-he will likely need repeat echocardiogram in 6 months when he returns.  4 carotid  artery disease-plan follow-up Dopplers August 2021.  5 hypertension-patient's blood pressure is controlled.  You present medications and follow.  6 hyperlipidemia-continue statin.  7 history of Mobitz 1 second-degree AV block-patient denies syncope.  Continue to follow.  Avoid AV nodal blocking agents.  8 prior atrial flutter ablation  9 chronic diastolic congestive heart failure-continue present dose of Lasix.   Kirk Ruths, MD

## 2018-02-23 ENCOUNTER — Encounter: Payer: Self-pay | Admitting: Internal Medicine

## 2018-02-23 ENCOUNTER — Other Ambulatory Visit: Payer: Self-pay | Admitting: Internal Medicine

## 2018-02-23 DIAGNOSIS — N183 Chronic kidney disease, stage 3 (moderate): Principal | ICD-10-CM

## 2018-02-23 DIAGNOSIS — N179 Acute kidney failure, unspecified: Secondary | ICD-10-CM

## 2018-02-28 ENCOUNTER — Other Ambulatory Visit: Payer: Self-pay | Admitting: Cardiology

## 2018-03-01 DIAGNOSIS — M545 Low back pain: Secondary | ICD-10-CM | POA: Diagnosis not present

## 2018-03-01 DIAGNOSIS — I70213 Atherosclerosis of native arteries of extremities with intermittent claudication, bilateral legs: Secondary | ICD-10-CM | POA: Diagnosis not present

## 2018-03-01 DIAGNOSIS — G894 Chronic pain syndrome: Secondary | ICD-10-CM | POA: Diagnosis not present

## 2018-03-01 DIAGNOSIS — I739 Peripheral vascular disease, unspecified: Secondary | ICD-10-CM | POA: Diagnosis not present

## 2018-03-01 DIAGNOSIS — M25551 Pain in right hip: Secondary | ICD-10-CM | POA: Diagnosis not present

## 2018-03-01 DIAGNOSIS — Z79891 Long term (current) use of opiate analgesic: Secondary | ICD-10-CM | POA: Diagnosis not present

## 2018-03-01 DIAGNOSIS — M5137 Other intervertebral disc degeneration, lumbosacral region: Secondary | ICD-10-CM | POA: Diagnosis not present

## 2018-03-03 ENCOUNTER — Ambulatory Visit (INDEPENDENT_AMBULATORY_CARE_PROVIDER_SITE_OTHER): Payer: Medicare Other | Admitting: Cardiology

## 2018-03-03 ENCOUNTER — Encounter: Payer: Self-pay | Admitting: Cardiology

## 2018-03-03 VITALS — BP 122/52 | HR 60 | Ht 71.0 in | Wt 165.0 lb

## 2018-03-03 DIAGNOSIS — I10 Essential (primary) hypertension: Secondary | ICD-10-CM | POA: Diagnosis not present

## 2018-03-03 DIAGNOSIS — I714 Abdominal aortic aneurysm, without rupture, unspecified: Secondary | ICD-10-CM

## 2018-03-03 DIAGNOSIS — I6523 Occlusion and stenosis of bilateral carotid arteries: Secondary | ICD-10-CM

## 2018-03-03 DIAGNOSIS — I2510 Atherosclerotic heart disease of native coronary artery without angina pectoris: Secondary | ICD-10-CM | POA: Diagnosis not present

## 2018-03-03 DIAGNOSIS — E7800 Pure hypercholesterolemia, unspecified: Secondary | ICD-10-CM | POA: Diagnosis not present

## 2018-03-03 NOTE — Patient Instructions (Signed)
Medication Instructions:  Your physician recommends that you continue on your current medications as directed. Please refer to the Current Medication list given to you today.  If you need a refill on your cardiac medications before your next appointment, please call your pharmacy.    Follow-Up: At [facility], you and your health needs are our priority.  As part of our continuing mission to provide you with exceptional heart care, we have created designated Provider Care Teams.  These Care Teams include your primary Cardiologist (physician) and Advanced Practice Providers (APPs -  Physician Assistants and Nurse Practitioners) who all work together to provide you with the care you need, when you need it. You will need a follow up appointment in 6 months.  Please call our office 2 months in advance to schedule this appointment.  You may see Brian Crenshaw, MD or one of the following Advanced Practice Providers on your designated Care Team:   Luke Kilroy, PA-C Krista Kroeger, PA-C . Callie Goodrich, PA-C    

## 2018-03-10 ENCOUNTER — Ambulatory Visit (INDEPENDENT_AMBULATORY_CARE_PROVIDER_SITE_OTHER): Payer: Medicare Other | Admitting: Neurology

## 2018-03-10 ENCOUNTER — Other Ambulatory Visit: Payer: Self-pay

## 2018-03-10 ENCOUNTER — Encounter: Payer: Self-pay | Admitting: Neurology

## 2018-03-10 VITALS — BP 130/49 | HR 57 | Resp 18 | Ht 71.0 in | Wt 165.0 lb

## 2018-03-10 DIAGNOSIS — M4712 Other spondylosis with myelopathy, cervical region: Secondary | ICD-10-CM | POA: Diagnosis not present

## 2018-03-10 DIAGNOSIS — I6523 Occlusion and stenosis of bilateral carotid arteries: Secondary | ICD-10-CM

## 2018-03-10 MED ORDER — PREGABALIN 50 MG PO CAPS: 50.0000 mg | ORAL_CAPSULE | Freq: Two times a day (BID) | ORAL | 2 refills | Status: DC

## 2018-03-10 NOTE — Progress Notes (Signed)
Reason for visit: Neck pain, headache  Referring physician: Dr. Abbie Sons is a 77 y.o. male  History of present illness:  [name] is a 77 year old right-handed white male with a history of chronic neck pain.  The patient has had several other underlying medical issues.  The patient has coronary artery disease, he suffered a myocardial infarction about 7 years ago.  He has recently had a reaction to doxycycline which resulted in renal insufficiency and he also developed a 40 pound weight loss associated with this.  Within the last year he has had a gradual change in his ability to walk, he has had to use a walker.  On 25 January 2018, he fell and fractured the left hip.  He underwent some rehab for this and he is now back home.  He is walking short distances currently.  The patient has had bilateral arm and leg discomfort since his myocardial infarction 7 years ago.  He has been on chronic opiate therapy with oxycodone with improvement in the pain.  He recently over the last 6 to 8 months began having sharp shooting pains coming up from the back of the neck to the top of the head, the opiate medications do not seem to help this type of pain.  The pain at times may be quite severe.  The patient reports no numbness extremities, he does have some chronic constipation on the opiate medication, he denies bladder dysfunction.  He has a congenitally short left leg.  The patient is sent to this office for an evaluation.  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  Increased cord signal was seen at the C4 and C5-6 levels.  Past Medical History:  Diagnosis Date  . AAA (abdominal aortic aneurysm) ([place])   . Blindness of left eye   . 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 CAD (coronary artery disease)    a. s/p CABG in 10/2008 with LIMA-LAD, SVG-OM1 with  Y-graft to PL branch, and SVG-RCA  . Carotid stenosis   . Cerebrovascular disease   . CHF (congestive heart failure) ([place])   . Cirrhosis ([place])    on CT a/p 07/2013, no longer drinking  . CONGENITAL UNSPEC REDUCTION DEFORMITY LOWER LIMB   . Constipation due to opioid therapy 2014   began about a year ago  . COPD (chronic obstructive pulmonary disease) ([place])   . Foley catheter in place   . HTN (hypertension)   . Hyperlipidemia   . 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 Neuromuscular disorder ([place])    PT STATES HE HAS BEEN TOLD HE EITHER HAD POLIO OR CEREBRAL PALSY - STATES HIS LEFT LEG IS SHORTER AND AFFECTS HIS BALANCE - HE WEARS ELEVATED SHOE -LEFT  . Pain    BACK, LEGS AND ARMS  . Shortness of breath    EXERTION  . TOBACCO USE, QUIT     Past Surgical History:  Procedure Laterality Date  . CORONARY ARTERY BYPASS GRAFT  11/03/2008   Ricard Dillon - x4: left internal mammary artery to the distal left anterior descending, saphemous vein graft to the first circumflex marginal branch with a Y graft sequentiallly to a left posterolateral branch, saphenous vein graft to the distal right coronary artery  . ELECTROPHYSIOLOGIC STUDY N/A 01/18/2015   Procedure:  A-Flutter Ablation;  Surgeon: Deboraha Sprang, MD;  Location: [facility];  Service: Cardiovascular;  Laterality: N/A;  . GREEN LIGHT LASER TURP (TRANSURETHRAL RESECTION OF PROSTATE N/A 02/14/2014   Procedure: GREEN LIGHT LASER TURP (TRANSURETHRAL RESECTION OF PROSTATE;  Surgeon: Festus Aloe, MD;  [facility];  Service: Urology;  Laterality: N/A;  . HIP ARTHROPLASTY Left 01/26/2018   Procedure: LEFT HIP POSTERIOR HEMIARTHROPLASTY;  Surgeon: [name], MD;  [facility];  Service: Orthopedics;  Laterality: Left;    Family History  Problem Relation Age of Onset  . Cirrhosis Mother        died at 59  . Heart attack Father   . Other Brother          GSW  . Other Brother        died in house fire  . Cancer Brother        unsure of type Believes colon or prostate/fim  . Colon cancer Neg Hx   . Stomach cancer Neg Hx     Social history:  reports that he has quit smoking. He has never used smokeless tobacco. He reports that he does not drink alcohol or use drugs.  Medications:  Prior to Admission medications   Medication Sig Start Date End Date Taking? Authorizing Provider  aspirin 81 MG tablet Take 81 mg by mouth daily.   Yes [provider]  finasteride (PROSCAR) 5 MG tablet Take 5 mg by mouth daily.   Yes [provider]  fluticasone (FLONASE) 50 MCG/ACT nasal spray PLACE 1 SPRAY INTO BOTH NOSTRILS EVERY MORNING. Patient taking differently: Place 1 spray into both nostrils daily.  12/07/17  Yes [name], MD  furosemide (LASIX) 40 MG tablet TAKE 2 PILLS IN THE MORNING AND 1 PILL IN THE AFTERNOON. Patient taking differently: Take 80 mg by mouth every morning.  02/15/18  Yes [name], MD  Guaifenesin 1200 MG TB12 Take 1 tablet (1,200 mg total) by mouth 2 (two) times daily. 02/15/18  Yes [name], MD  hydrALAZINE (APRESOLINE) 10 MG tablet Take 1 tablet (10 mg total) by mouth 3 (three) times daily. 03/01/18  Yes Lelon Perla, MD  ibuprofen (ADVIL,MOTRIN) 200 MG tablet Take 400 mg by mouth every 6 (six) hours as needed for moderate pain.   Yes [provider]  lactulose (CHRONULAC) 10 GM/15ML solution Take 30 mLs (20 g total) by mouth 2 (two) times daily as needed for mild constipation or moderate constipation. 10/13/17  Yes [name], MD  oxyCODONE HCl 30 MG TABA Take 30 mg by mouth every 4 (four) hours.   Yes [provider]  potassium chloride SA (KLOR-CON M20) 20 MEQ tablet Take 2 tablets (40 mEq total) by mouth every evening. KEEP OV. Patient taking differently: Take 20 mEq by mouth daily.  12/23/17  Yes Lelon Perla, MD  pravastatin  (PRAVACHOL) 20 MG tablet TAKE 1 TABLET BY MOUTH EVERY DAY Patient taking differently: Take 20 mg by mouth daily.  01/12/18  Yes [name], MD  sertraline (ZOLOFT) 25 MG tablet TAKE 1 TABLET BY MOUTH EVERYDAY AT BEDTIME Patient taking differently: Take 25 mg by mouth daily as needed. Helps with sleep 06/05/17  Yes [name], MD  spironolactone (ALDACTONE) 25 MG tablet TAKE 1 TABLET BY MOUTH EVERY DAY Patient taking differently: Take 25 mg by mouth daily.  01/08/18  Yes [name], MD  tamsulosin (FLOMAX) 0.4 MG CAPS capsule Take  1 capsule (0.4 mg total) by mouth daily. 09/16/17  Yes Marrian Salvage, FNP  VENTOLIN HFA 108 (90 Base) MCG/ACT inhaler TAKE 2 PUFFS BY MOUTH EVERY 6 HOURS AS NEEDED FOR WHEEZE Patient taking differently: Inhale 2 puffs into the lungs every 6 (six) hours as needed for wheezing.  12/01/17  Yes [name], MD      Allergies  Allergen Reactions  . Doxycycline   . Amlodipine Nausea And Vomiting and Other (See Comments)    dizziness  . Fish Allergy Nausea And Vomiting    STATES HE HAS NOT EATEN ANY FISH INCLUDING SHELLFISH FOR PAST 40 YRS - IT CAUSED NAUSEA  . Hydrocodone Itching and Rash  . Other Nausea And Vomiting    STATES HE HAS NOT EATEN ANY FISH INCLUDING SHELLFISH FOR PAST 40 YRS - IT CAUSED NAUSEA  . Tylenol [Acetaminophen] Other (See Comments)    Liver problems    ROS:  Out of a complete 14 system review of symptoms, the patient complains only of the following symptoms, and all other reviewed systems are negative.  Headache Chronic arm and leg pain, neck pain Walking difficulty  Blood pressure (!) 130/49, pulse (!) 57, resp. rate 18, height 5\' 11"  (1.803 m), weight 165 lb (74.8 kg).  Physical Exam  General: The patient is alert and cooperative at the time of the examination.  Eyes: Pupils are equal, round, and reactive to light. Discs are flat bilaterally.  Neck: The neck is supple, no carotid  bruits are noted.  Respiratory: The respiratory examination is clear.  Cardiovascular: The cardiovascular examination reveals a regular rate and rhythm, no obvious murmurs or rubs are noted.  Skin: Extremities are with 1-2+ edema below the knees.  Neurologic Exam  Mental status: The patient is alert and oriented x 3 at the time of the examination. The patient has apparent normal recent and remote memory, with an apparently normal attention span and concentration ability.  Cranial nerves: Facial symmetry is present. There is good sensation of the face to pinprick and soft touch bilaterally. The strength of the facial muscles and the muscles to head turning and shoulder shrug are normal bilaterally. Speech is well enunciated, no aphasia or dysarthria is noted. Extraocular movements are full, with exception with superior gaze there is exotropia of the left eye. Visual fields are full. The tongue is midline, and the patient has symmetric elevation of the soft palate. No obvious hearing deficits are noted.  Motor: The motor testing reveals 5 over 5 strength of all 4 extremities. Good symmetric motor tone is noted throughout.  Sensory: Sensory testing is intact to pinprick, soft touch, vibration sensation, and position sense on all 4 extremities. No evidence of extinction is noted.  Coordination: Cerebellar testing reveals good finger-nose-finger and heel-to-shin bilaterally.  Gait and station: The patient can walk with assistance, he has some difficulty arising from a seated position.  His gait is wide-based, he takes short steps.  Tandem gait was not attempted.  Reflexes: Deep tendon reflexes are symmetric and are somewhat brisk throughout with exception of depression of the ankle jerk reflexes bilaterally. Toes are downgoing bilaterally.   MRI brain and cervical 01/11/18:  IMPRESSION: MRI head:  1. No acute intracranial abnormality identified. 2. Small chronic lacunar infarct in the right  thalamus. Mild chronic microvascular ischemic changes and volume loss of the brain for age.  MRI cervical spine:  1. Mild edema within left aspect of anterior arch of C1 and odontoid process likely  related to degenerative arthropathy. 2. No acute fracture or discitis. 3. 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 4. Severe cervical spondylosis with multilevel disc and facet degenerative changes. 5. Severe C3-4, moderate to severe C4-5, and moderate C5-6 canal stenosis. 6. Anterior cord impingement at C3-4 through C6-7. 7. Multilevel high-grade foraminal stenosis.  * MRI scan images were reviewed online. I agree with the written report.    Assessment/Plan:  1.  Cervical myelopathy, spinal stenosis  2.  Gait disorder  3.  Chronic neck, arm, and leg pain  4.  Posterior head pain  The patient will be placed on low-dose Lyrica taking 50 mg twice daily.  He will call for any dose adjustments.  The patient will be referred to neurosurgery for evaluation of the spinal cord compression and cervical myelopathy.  The patient will follow-up here in about 4 months.  Jill Alexanders MD 03/10/2018 11:13 AM  [facility] [address] [place], [place] [address]  Phone [phone] Fax [phone]

## 2018-03-10 NOTE — Patient Instructions (Signed)
We will start lyrica 50 mg twice a day, call for any dose adjustments.   Lyrica (pregabalin) may cause drowsiness, gait instability, ankle swelling, or cognitive slowing as well as possible dizziness. If any significant side effects occur associated with this medication, please contact our office.

## 2018-03-13 ENCOUNTER — Telehealth: Payer: Self-pay | Admitting: Neurology

## 2018-03-13 NOTE — Telephone Encounter (Signed)
Patient called, stating, neck is still hurting, has been taking the lyrica as instructed. I explained to him, that Lyrica can take several days to kick in. I reviewed his chart. Pt has been referred to [facility]. Faith, pls look into perhaps expediting referral?

## 2018-03-15 ENCOUNTER — Telehealth: Payer: Self-pay | Admitting: Neurology

## 2018-03-15 NOTE — Telephone Encounter (Signed)
I called the patient.  The patient is taking Lyrica 50 mg twice daily, he is tolerating the drug well.  We will go up to 100 mg twice daily, if he is not doing well in about 4 or 5 days, he will call and we will go up to 150 mg twice daily.  He already has opiate medications to take for pain.

## 2018-03-15 NOTE — Telephone Encounter (Signed)
I have called Rollene Fare and left her a message asking her if we can get patient's apt moved up.

## 2018-03-15 NOTE — Telephone Encounter (Signed)
Pt states he is having a very bad headache and is asking for something to go along with the Lyrica.  Pt asking RN to call him

## 2018-03-15 NOTE — Telephone Encounter (Signed)
Spoke with pt.  We are working on getting his NS appt. moved up. He c/o continued neck pain, h/a, would like to know what he can take for this.  Sts. he has tried Oxycodone and Ibuprofen without relief. He is not able to take Tylenol products due to liver impairment. Will check with Dr. Jannifer Franklin for other recommendations/fim

## 2018-03-15 NOTE — Telephone Encounter (Signed)
Patient is seeing Dr. Trenton Gammon  03/24/2018 arrive @ 2:00 for 2: 30 . Patient aware.

## 2018-03-17 ENCOUNTER — Other Ambulatory Visit: Payer: Self-pay | Admitting: Cardiology

## 2018-03-17 MED ORDER — TIZANIDINE HCL 2 MG PO TABS: 2.0000 mg | ORAL_TABLET | Freq: Three times a day (TID) | ORAL | 1 refills | Status: DC

## 2018-03-17 NOTE — Addendum Note (Signed)
Addended by: Kathrynn Ducking on: 03/17/2018 04:13 PM   Modules accepted: Orders

## 2018-03-17 NOTE — Telephone Encounter (Signed)
I called the patient.  The patient is having ongoing pain in the neck and pain down the arms.  He is on the Lyrica 100 mg twice daily, still having a lot of discomfort.  I will call in tizanidine as a muscle relaxant, we will start it 2 mg 3 times a day.

## 2018-03-17 NOTE — Telephone Encounter (Signed)
Pt requesting a call, stating that the pain in his head in gotten very intense, stating that he hasnt been able to sleep, and starting to starting to see double. Please call to advise.

## 2018-03-17 NOTE — Telephone Encounter (Signed)
I called pt.  He states he does not feel like he needs to go to ED.  He states that head pain is worse even with lyrica 100mg  po BID.  This has been 2 days since last increase.  He also stated that having pain from back of neck to bilateral arms. No numbness/ weakness. Opiate meds not helping (like taking candy).  He was telling jokes.  I would let Dr. Jannifer Franklin know if any other recommendations.

## 2018-03-24 DIAGNOSIS — M4802 Spinal stenosis, cervical region: Secondary | ICD-10-CM | POA: Diagnosis not present

## 2018-03-25 ENCOUNTER — Other Ambulatory Visit: Payer: Self-pay | Admitting: Neurosurgery

## 2018-03-25 DIAGNOSIS — M4802 Spinal stenosis, cervical region: Secondary | ICD-10-CM

## 2018-03-29 ENCOUNTER — Other Ambulatory Visit: Payer: Self-pay | Admitting: Neurology

## 2018-03-29 ENCOUNTER — Ambulatory Visit
Admission: RE | Admit: 2018-03-29 | Discharge: 2018-03-29 | Disposition: A | Discharge status: Home / Self Care | Payer: Medicare Other | Source: Ambulatory Visit | Attending: Neurosurgery | Admitting: Neurosurgery

## 2018-03-29 DIAGNOSIS — I70213 Atherosclerosis of native arteries of extremities with intermittent claudication, bilateral legs: Secondary | ICD-10-CM | POA: Diagnosis not present

## 2018-03-29 DIAGNOSIS — M545 Low back pain: Secondary | ICD-10-CM | POA: Diagnosis not present

## 2018-03-29 DIAGNOSIS — M4802 Spinal stenosis, cervical region: Secondary | ICD-10-CM

## 2018-03-29 DIAGNOSIS — I739 Peripheral vascular disease, unspecified: Secondary | ICD-10-CM | POA: Diagnosis not present

## 2018-03-29 DIAGNOSIS — M47812 Spondylosis without myelopathy or radiculopathy, cervical region: Secondary | ICD-10-CM | POA: Diagnosis not present

## 2018-03-29 DIAGNOSIS — M5137 Other intervertebral disc degeneration, lumbosacral region: Secondary | ICD-10-CM | POA: Diagnosis not present

## 2018-03-29 DIAGNOSIS — M25551 Pain in right hip: Secondary | ICD-10-CM | POA: Diagnosis not present

## 2018-03-29 DIAGNOSIS — G894 Chronic pain syndrome: Secondary | ICD-10-CM | POA: Diagnosis not present

## 2018-03-29 DIAGNOSIS — Z79891 Long term (current) use of opiate analgesic: Secondary | ICD-10-CM | POA: Diagnosis not present

## 2018-03-29 MED ORDER — PREGABALIN 100 MG PO CAPS: 100.0000 mg | ORAL_CAPSULE | Freq: Two times a day (BID) | ORAL | 1 refills | Status: DC

## 2018-03-29 NOTE — Telephone Encounter (Signed)
Dr. Jannifer Franklin- can you escribe new rx Lyrica? In your last message your documented he is taking Lyrica 100mg  BID.

## 2018-03-29 NOTE — Telephone Encounter (Signed)
Pt requesting refills for pregabalin (LYRICA) 50 MG capsule sent to [facility] on [address]. Stating he is now taking 200MG  daily.

## 2018-03-30 ENCOUNTER — Other Ambulatory Visit: Payer: Self-pay | Admitting: Cardiology

## 2018-04-03 ENCOUNTER — Other Ambulatory Visit: Payer: Self-pay | Admitting: Family

## 2018-04-08 ENCOUNTER — Other Ambulatory Visit: Payer: Self-pay | Admitting: Internal Medicine

## 2018-04-08 ENCOUNTER — Other Ambulatory Visit: Payer: Self-pay | Admitting: Neurology

## 2018-04-08 DIAGNOSIS — S134XXA Sprain of ligaments of cervical spine, initial encounter: Secondary | ICD-10-CM | POA: Diagnosis not present

## 2018-04-15 ENCOUNTER — Other Ambulatory Visit: Payer: Self-pay

## 2018-04-15 ENCOUNTER — Ambulatory Visit: Payer: Self-pay | Admitting: *Deleted

## 2018-04-15 MED ORDER — TIZANIDINE HCL 2 MG PO TABS: 2.0000 mg | ORAL_TABLET | Freq: Three times a day (TID) | ORAL | 1 refills | Status: DC

## 2018-04-15 MED ORDER — NYSTATIN 100000 UNIT/GM EX OINT: 1.0000 "application " | TOPICAL_OINTMENT | Freq: Two times a day (BID) | CUTANEOUS | 0 refills | Status: DC

## 2018-04-15 NOTE — Telephone Encounter (Signed)
Summary: yeast infection    Pt called back with name of the med - Triamcinolone Acetonide  ----- Message from [place] Faster sent at 04/15/2018 3:24 PM EST ----- Pt called in and stated he broke his hip in oct and is recover from that. He is on a walker and hard from him to come in. He stated that he has noticed a order around his Genital area. He stated he doesn't have any other symptoms. He stated that he has had this before. He said it was like a yeast infection. He would like to see if the med cream that he had once before could be called in . He is not sure of the name of the med and not on his current name.    [address]      Patient is requesting a refill of medication- Triamcinolone. Patient has used this in the past and it helped. Patient does have appointment with urology 05/17/17. Patient states he is not driving at this time and he is relying on others for transportation which is making it difficult for him. Reason for Disposition . Nursing judgment or information in reference  Answer Assessment - Initial Assessment Questions 1. REASON FOR CALL: "What is your main concern right now?"     Odor in genital area 2. ONSET: "When did the odor start?"     Patient states he takes a shower and the next day- he has odor 3. SEVERITY: "How bad is the odor?"     Bothersome to patient 4. FEVER: "Do you have a fever?"     No fever 5. OTHER SYMPTOMS: "Do you have any other new symptoms?"     No other symptoms- no redness, no itching, no urinary symptoms 6. INTERVENTIONS AND RESPONSE: "What have you done so far to try to make this better? What medications have you used?"     Patient used nystatin- no help 7. PREGNANCY: "Is there any chance you are pregnant?"     n/a  Protocols used: NO GUIDELINE AVAILABLE-A-AH

## 2018-04-15 NOTE — Telephone Encounter (Signed)
Sent in nystatin cream which is for skin yeast infection. Use twice a day.

## 2018-04-15 NOTE — Addendum Note (Signed)
Addended by: [name] A on: 04/15/2018 04:42 PM   Modules accepted: Orders

## 2018-04-16 ENCOUNTER — Encounter: Payer: Self-pay | Admitting: Internal Medicine

## 2018-04-16 DIAGNOSIS — Z96642 Presence of left artificial hip joint: Secondary | ICD-10-CM | POA: Diagnosis not present

## 2018-04-16 DIAGNOSIS — R2681 Unsteadiness on feet: Secondary | ICD-10-CM

## 2018-04-16 DIAGNOSIS — Z471 Aftercare following joint replacement surgery: Secondary | ICD-10-CM | POA: Diagnosis not present

## 2018-04-16 NOTE — Telephone Encounter (Signed)
Pt has been informed.

## 2018-04-20 DIAGNOSIS — R293 Abnormal posture: Secondary | ICD-10-CM | POA: Diagnosis not present

## 2018-04-20 DIAGNOSIS — M542 Cervicalgia: Secondary | ICD-10-CM | POA: Diagnosis not present

## 2018-04-20 DIAGNOSIS — M6281 Muscle weakness (generalized): Secondary | ICD-10-CM | POA: Diagnosis not present

## 2018-04-20 DIAGNOSIS — R51 Headache: Secondary | ICD-10-CM | POA: Diagnosis not present

## 2018-04-28 ENCOUNTER — Other Ambulatory Visit: Payer: Self-pay | Admitting: Cardiology

## 2018-04-29 DIAGNOSIS — G894 Chronic pain syndrome: Secondary | ICD-10-CM | POA: Diagnosis not present

## 2018-04-29 DIAGNOSIS — M25551 Pain in right hip: Secondary | ICD-10-CM | POA: Diagnosis not present

## 2018-04-29 DIAGNOSIS — M5137 Other intervertebral disc degeneration, lumbosacral region: Secondary | ICD-10-CM | POA: Diagnosis not present

## 2018-04-29 DIAGNOSIS — I70213 Atherosclerosis of native arteries of extremities with intermittent claudication, bilateral legs: Secondary | ICD-10-CM | POA: Diagnosis not present

## 2018-04-29 DIAGNOSIS — I739 Peripheral vascular disease, unspecified: Secondary | ICD-10-CM | POA: Diagnosis not present

## 2018-04-29 DIAGNOSIS — Z79891 Long term (current) use of opiate analgesic: Secondary | ICD-10-CM | POA: Diagnosis not present

## 2018-04-29 DIAGNOSIS — M545 Low back pain: Secondary | ICD-10-CM | POA: Diagnosis not present

## 2018-05-15 ENCOUNTER — Other Ambulatory Visit: Payer: Self-pay | Admitting: Neurology

## 2018-05-17 DIAGNOSIS — N401 Enlarged prostate with lower urinary tract symptoms: Secondary | ICD-10-CM | POA: Diagnosis not present

## 2018-05-17 DIAGNOSIS — R310 Gross hematuria: Secondary | ICD-10-CM | POA: Diagnosis not present

## 2018-05-18 ENCOUNTER — Other Ambulatory Visit: Payer: Self-pay

## 2018-05-18 ENCOUNTER — Encounter (HOSPITAL_COMMUNITY): Payer: Self-pay | Admitting: Emergency Medicine

## 2018-05-18 ENCOUNTER — Emergency Department (HOSPITAL_COMMUNITY)
Admission: EM | Admit: 2018-05-18 | Discharge: 2018-05-18 | Disposition: A | Discharge status: Home / Self Care | Payer: Medicare Other | Attending: Emergency Medicine | Admitting: Emergency Medicine

## 2018-05-18 DIAGNOSIS — G8929 Other chronic pain: Secondary | ICD-10-CM

## 2018-05-18 DIAGNOSIS — J449 Chronic obstructive pulmonary disease, unspecified: Secondary | ICD-10-CM | POA: Insufficient documentation

## 2018-05-18 DIAGNOSIS — I252 Old myocardial infarction: Secondary | ICD-10-CM | POA: Diagnosis not present

## 2018-05-18 DIAGNOSIS — Z87891 Personal history of nicotine dependence: Secondary | ICD-10-CM | POA: Diagnosis not present

## 2018-05-18 DIAGNOSIS — Z7982 Long term (current) use of aspirin: Secondary | ICD-10-CM | POA: Diagnosis not present

## 2018-05-18 DIAGNOSIS — R001 Bradycardia, unspecified: Secondary | ICD-10-CM | POA: Diagnosis not present

## 2018-05-18 DIAGNOSIS — I130 Hypertensive heart and chronic kidney disease with heart failure and stage 1 through stage 4 chronic kidney disease, or unspecified chronic kidney disease: Secondary | ICD-10-CM | POA: Diagnosis not present

## 2018-05-18 DIAGNOSIS — Z760 Encounter for issue of repeat prescription: Secondary | ICD-10-CM | POA: Insufficient documentation

## 2018-05-18 DIAGNOSIS — Z79899 Other long term (current) drug therapy: Secondary | ICD-10-CM | POA: Diagnosis not present

## 2018-05-18 DIAGNOSIS — M79603 Pain in arm, unspecified: Secondary | ICD-10-CM | POA: Diagnosis not present

## 2018-05-18 DIAGNOSIS — N183 Chronic kidney disease, stage 3 (moderate): Secondary | ICD-10-CM | POA: Diagnosis not present

## 2018-05-18 DIAGNOSIS — I5032 Chronic diastolic (congestive) heart failure: Secondary | ICD-10-CM | POA: Insufficient documentation

## 2018-05-18 DIAGNOSIS — Z951 Presence of aortocoronary bypass graft: Secondary | ICD-10-CM | POA: Diagnosis not present

## 2018-05-18 DIAGNOSIS — I2510 Atherosclerotic heart disease of native coronary artery without angina pectoris: Secondary | ICD-10-CM | POA: Insufficient documentation

## 2018-05-18 MED ORDER — HYDROMORPHONE HCL 1 MG/ML IJ SOLN
1.0000 mg | Freq: Once | INTRAMUSCULAR | Status: AC
  Administered 2018-05-18: 1 mg via INTRAMUSCULAR
  Filled 2018-05-18: qty 1

## 2018-05-18 NOTE — ED Provider Notes (Signed)
[facility] DEPT Provider Note   CSN: [id number] Arrival date & time: 05/18/18  3149     History   Chief Complaint Chief Complaint  Patient presents with  . Medication Refill    HPI [name] is a 78 y.o. male.  Pt presents to the ED today with pain.  Pt said he accidentally threw away his oxycodone 30 mg.  He got 150 filled on 1/9.  He is under a pain Futures trader.  The pt last took oxycodone on the 26th.  The pt said he did not think he could tolerate it any more.     Past Medical History:  Diagnosis Date  . AAA (abdominal aortic aneurysm) ([place])   . Blindness of left eye   . 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 CAD (coronary artery disease)    a. s/p CABG in 10/2008 with LIMA-LAD, SVG-OM1 with Y-graft to PL branch, and SVG-RCA  . Carotid stenosis   . Cerebrovascular disease   . CHF (congestive heart failure) ([place])   . Cirrhosis ([place])    on CT a/p 07/2013, no longer drinking  . CONGENITAL UNSPEC REDUCTION DEFORMITY LOWER LIMB   . Constipation due to opioid therapy 2014   began about a year ago  . COPD (chronic obstructive pulmonary disease) ([place])   . Foley catheter in place   . HTN (hypertension)   . Hyperlipidemia   . 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 Neuromuscular disorder ([place])    PT STATES HE HAS BEEN TOLD HE EITHER HAD POLIO OR CEREBRAL PALSY - STATES HIS LEFT LEG IS SHORTER AND AFFECTS HIS BALANCE - HE WEARS ELEVATED SHOE -LEFT  . Pain    BACK, LEGS AND ARMS  . Shortness of breath    EXERTION  . TOBACCO USE, QUIT     Patient Active Problem List   Diagnosis Date Noted  . Closed left hip fracture, initial encounter ([place]) 01/26/2018  . Neck pain 12/18/2017  . Right hip pain 06/22/2017  . Chronic diastolic heart failure ([place]) 03/16/2017  . CKD (chronic kidney disease) stage 3, GFR 30-59 ml/min  ([facility]) 03/16/2017  . Diastolic dysfunction 70/26/3785  . Opiate dependence ([place]) 02/13/2017  . Uremia 01/21/2017  . Acute renal failure superimposed on stage 3 chronic kidney disease ([place]) 01/21/2017  . Atrial flutter ([place]) 11/24/2014  . Aortic stenosis 03/24/2014  . Second degree AV block, Mobitz type I 09/06/2013  . Cirrhosis ([place]) 07/28/2013  . Bladder outlet obstruction 07/28/2013  . Impaired glucose metabolism   . Chronic back pain   . Benign neoplasm of colon 01/08/2011  . Abdominal aortic aneurysm ([place]) 08/12/2010  . TOBACCO USE, QUIT 04/11/2009  . Occlusion and stenosis of carotid artery 01/08/2009  . Hyperlipidemia 11/13/2008  . ANEMIA 11/13/2008  . Essential hypertension 11/13/2008  . Coronary atherosclerosis 11/13/2008  . COPD (chronic obstructive pulmonary disease) ([place]) 11/13/2008    Past Surgical History:  Procedure Laterality Date  . CORONARY ARTERY BYPASS GRAFT  11/03/2008   Ricard Dillon - x4: left internal mammary artery to the distal left anterior descending, saphemous vein graft to the first circumflex marginal branch with a Y graft sequentiallly to a left posterolateral branch, saphenous vein graft to the distal right coronary artery  . ELECTROPHYSIOLOGIC STUDY N/A 01/18/2015   Procedure: A-Flutter Ablation;  Surgeon: Deboraha Sprang, MD;  Location: [facility];  Service:  Cardiovascular;  Laterality: N/A;  . GREEN LIGHT LASER TURP (TRANSURETHRAL RESECTION OF PROSTATE N/A 02/14/2014   Procedure: GREEN LIGHT LASER TURP (TRANSURETHRAL RESECTION OF PROSTATE;  Surgeon: Festus Aloe, MD;  Location: [facility];  Service: Urology;  Laterality: N/A;  . HIP ARTHROPLASTY Left 01/26/2018   Procedure: LEFT HIP POSTERIOR HEMIARTHROPLASTY;  Surgeon: [name], MD;  Location: [facility];  Service: Orthopedics;  Laterality: Left;        Home Medications    Prior to Admission medications   Medication Sig Start Date End Date Taking? Authorizing Provider  aspirin 81 MG tablet  Take 81 mg by mouth daily.    [provider]  finasteride (PROSCAR) 5 MG tablet Take 5 mg by mouth daily.    [provider]  fluticasone (FLONASE) 50 MCG/ACT nasal spray PLACE 1 SPRAY INTO BOTH NOSTRILS EVERY MORNING. Patient taking differently: Place 1 spray into both nostrils daily.  12/07/17   [name], MD  furosemide (LASIX) 40 MG tablet TAKE 2 PILLS IN THE MORNING AND 1 PILL IN THE AFTERNOON. Patient taking differently: Take 80 mg by mouth every morning.  02/15/18   [name], MD  Guaifenesin 1200 MG TB12 Take 1 tablet (1,200 mg total) by mouth 2 (two) times daily. 02/15/18   [name], MD  hydrALAZINE (APRESOLINE) 10 MG tablet Take 1 tablet (10 mg total) by mouth 3 (three) times daily. 03/01/18   Lelon Perla, MD  hydrALAZINE (APRESOLINE) 10 MG tablet TAKE 1 TABLET BY MOUTH 3 TIMES A DAY 03/30/18   Lelon Perla, MD  ibuprofen (ADVIL,MOTRIN) 200 MG tablet Take 400 mg by mouth every 6 (six) hours as needed for moderate pain.    [provider]  KLOR-CON M20 20 MEQ tablet TAKE 2 TABLETS (40 MEQ TOTAL) BY MOUTH 2 (TWO) TIMES DAILY. 04/29/18   Lelon Perla, MD  lactulose (CHRONULAC) 10 GM/15ML solution Take 30 mLs (20 g total) by mouth 2 (two) times daily as needed for mild constipation or moderate constipation. 10/13/17   [name], MD  nystatin ointment (MYCOSTATIN) Apply 1 application topically 2 (two) times daily. 04/15/18   [name], MD  oxyCODONE HCl 30 MG TABA Take 30 mg by mouth every 4 (four) hours.    [provider]  pravastatin (PRAVACHOL) 20 MG tablet TAKE 1 TABLET BY MOUTH EVERY DAY Patient taking differently: Take 20 mg by mouth daily.  01/12/18   [name], MD  pregabalin (LYRICA) 100 MG capsule Take 1 capsule (100 mg total) by mouth 2 (two) times daily. 03/29/18   Kathrynn Ducking, MD  sertraline (ZOLOFT) 25 MG tablet TAKE 1 TABLET BY MOUTH EVERYDAY AT  BEDTIME Patient taking differently: Take 25 mg by mouth daily as needed. Helps with sleep 06/05/17   [name], MD  spironolactone (ALDACTONE) 25 MG tablet TAKE 1 TABLET BY MOUTH EVERY DAY 04/08/18   [name], MD  tamsulosin (FLOMAX) 0.4 MG CAPS capsule TAKE 1 CAPSULE BY MOUTH EVERY DAY 04/05/18   [name], MD  tiZANidine (ZANAFLEX) 2 MG tablet TAKE 1 TABLET (2 MG TOTAL) BY MOUTH 3 (THREE) TIMES DAILY. 05/17/18   Kathrynn Ducking, MD  VENTOLIN HFA 108 (90 Base) MCG/ACT inhaler TAKE 2 PUFFS BY MOUTH EVERY 6 HOURS AS NEEDED FOR WHEEZE Patient taking differently: Inhale 2 puffs into the lungs every 6 (six) hours as needed for wheezing.  12/01/17   [name], MD  Family History Family History  Problem Relation Age of Onset  . Cirrhosis Mother        died at 44  . Heart attack Father   . Other Brother        GSW  . Other Brother        died in house fire  . Cancer Brother        unsure of type Believes colon or prostate/fim  . Colon cancer Neg Hx   . Stomach cancer Neg Hx     Social History Social History   Tobacco Use  . Smoking status: Former Research scientist (life sciences)  . Smokeless tobacco: Never Used  Substance Use Topics  . Alcohol use: No    Alcohol/week: 0.0 standard drinks    Comment: History of heavy alcohol use per pt. Quit many years ago1/1/ 2005  . Drug use: No     Allergies   Doxycycline; Amlodipine; Fish allergy; Hydrocodone; Other; and Tylenol [acetaminophen]   Review of Systems Review of Systems  Musculoskeletal:       Pain everywhere  All other systems reviewed and are negative.    Physical Exam Updated Vital Signs BP (!) 148/71 (BP Location: Left Arm)   Pulse (!) 56   Temp 97.6 F (36.4 C) (Oral)   Resp 16   Ht 5\' 10"  (1.778 m)   Wt 77.1 kg   SpO2 99%   BMI 24.39 kg/m   Physical Exam Vitals signs and nursing note reviewed.  Constitutional:      Appearance: Normal appearance.  HENT:     Head: Normocephalic  and atraumatic.     Right Ear: External ear normal.     Left Ear: External ear normal.     Nose: Nose normal.     Mouth/Throat:     Mouth: Mucous membranes are moist.  Eyes:     Extraocular Movements: Extraocular movements intact.     Pupils: Pupils are equal, round, and reactive to light.  Neck:     Musculoskeletal: Normal range of motion and neck supple.  Cardiovascular:     Rate and Rhythm: Normal rate and regular rhythm.     Pulses: Normal pulses.     Heart sounds: Normal heart sounds.  Pulmonary:     Effort: Pulmonary effort is normal.     Breath sounds: Normal breath sounds.  Abdominal:     General: Abdomen is flat.  Musculoskeletal: Normal range of motion.  Skin:    General: Skin is warm and dry.     Capillary Refill: Capillary refill takes less than 2 seconds.  Neurological:     General: No focal deficit present.     Mental Status: He is alert and oriented to person, place, and time.  Psychiatric:        Mood and Affect: Mood normal.        Behavior: Behavior normal.      ED Treatments / Results  Labs (all labs ordered are listed, but only abnormal results are displayed) Labs Reviewed - No data to display  EKG None  Radiology No results found.  Procedures Procedures (including critical care time)  Medications Ordered in ED Medications  HYDROmorphone (DILAUDID) injection 1 mg (has no administration in time range)     Initial Impression / Assessment and Plan / ED Course  I have reviewed the triage vital signs and the nursing notes.  Pertinent labs & imaging results that were available during my care of the patient were reviewed by me and  considered in my medical decision making (see chart for details).     Pt is under a pain management contract.  He knows I can't refill his oxycodone.  The pt is given a dose of dilaudid in ED.  He is told to f/u with his pain management doctor.  Return if worse.  Final Clinical Impressions(s) / ED Diagnoses    Final diagnoses:  Other chronic pain    ED Discharge Orders    None       Isla Pence, MD 05/18/18 [phone]

## 2018-05-18 NOTE — ED Notes (Signed)
Bed: WA20 Expected date:  Expected time:  Means of arrival:  Comments: 

## 2018-05-18 NOTE — ED Triage Notes (Signed)
Pt reports that he is out of his medication after accidentally dumping out the wrong medications. Pt normally takes Oxycodone 30mg  five times a day.

## 2018-05-21 ENCOUNTER — Ambulatory Visit: Payer: Medicare Other | Admitting: Physical Therapy

## 2018-05-27 DIAGNOSIS — Z79891 Long term (current) use of opiate analgesic: Secondary | ICD-10-CM | POA: Diagnosis not present

## 2018-05-27 DIAGNOSIS — G894 Chronic pain syndrome: Secondary | ICD-10-CM | POA: Diagnosis not present

## 2018-05-27 DIAGNOSIS — I70213 Atherosclerosis of native arteries of extremities with intermittent claudication, bilateral legs: Secondary | ICD-10-CM | POA: Diagnosis not present

## 2018-05-27 DIAGNOSIS — I739 Peripheral vascular disease, unspecified: Secondary | ICD-10-CM | POA: Diagnosis not present

## 2018-05-27 DIAGNOSIS — M545 Low back pain: Secondary | ICD-10-CM | POA: Diagnosis not present

## 2018-05-27 DIAGNOSIS — M5137 Other intervertebral disc degeneration, lumbosacral region: Secondary | ICD-10-CM | POA: Diagnosis not present

## 2018-05-27 DIAGNOSIS — M25551 Pain in right hip: Secondary | ICD-10-CM | POA: Diagnosis not present

## 2018-05-28 DIAGNOSIS — M545 Low back pain: Secondary | ICD-10-CM | POA: Diagnosis not present

## 2018-05-28 DIAGNOSIS — Z96642 Presence of left artificial hip joint: Secondary | ICD-10-CM | POA: Diagnosis not present

## 2018-05-28 DIAGNOSIS — Z471 Aftercare following joint replacement surgery: Secondary | ICD-10-CM | POA: Diagnosis not present

## 2018-05-28 DIAGNOSIS — M5416 Radiculopathy, lumbar region: Secondary | ICD-10-CM | POA: Diagnosis not present

## 2018-06-01 ENCOUNTER — Other Ambulatory Visit: Payer: Self-pay | Admitting: Internal Medicine

## 2018-06-07 ENCOUNTER — Encounter: Payer: Self-pay | Admitting: Internal Medicine

## 2018-06-07 DIAGNOSIS — R82998 Other abnormal findings in urine: Secondary | ICD-10-CM

## 2018-06-09 ENCOUNTER — Ambulatory Visit: Payer: Medicare Other | Admitting: Physical Therapy

## 2018-06-12 ENCOUNTER — Emergency Department (HOSPITAL_COMMUNITY)
Admission: EM | Admit: 2018-06-12 | Discharge: 2018-06-12 | Disposition: A | Discharge status: Home / Self Care | Payer: Medicare Other | Attending: Emergency Medicine | Admitting: Emergency Medicine

## 2018-06-12 ENCOUNTER — Emergency Department (HOSPITAL_COMMUNITY): Payer: Medicare Other

## 2018-06-12 DIAGNOSIS — F1120 Opioid dependence, uncomplicated: Secondary | ICD-10-CM | POA: Diagnosis not present

## 2018-06-12 DIAGNOSIS — S73005A Unspecified dislocation of left hip, initial encounter: Secondary | ICD-10-CM | POA: Diagnosis not present

## 2018-06-12 DIAGNOSIS — J449 Chronic obstructive pulmonary disease, unspecified: Secondary | ICD-10-CM | POA: Insufficient documentation

## 2018-06-12 DIAGNOSIS — Z96642 Presence of left artificial hip joint: Secondary | ICD-10-CM | POA: Diagnosis not present

## 2018-06-12 DIAGNOSIS — Z79899 Other long term (current) drug therapy: Secondary | ICD-10-CM | POA: Insufficient documentation

## 2018-06-12 DIAGNOSIS — Y92019 Unspecified place in single-family (private) house as the place of occurrence of the external cause: Secondary | ICD-10-CM | POA: Diagnosis not present

## 2018-06-12 DIAGNOSIS — M25552 Pain in left hip: Secondary | ICD-10-CM | POA: Diagnosis not present

## 2018-06-12 DIAGNOSIS — T84021A Dislocation of internal left hip prosthesis, initial encounter: Secondary | ICD-10-CM

## 2018-06-12 DIAGNOSIS — I252 Old myocardial infarction: Secondary | ICD-10-CM | POA: Insufficient documentation

## 2018-06-12 DIAGNOSIS — Z7982 Long term (current) use of aspirin: Secondary | ICD-10-CM | POA: Insufficient documentation

## 2018-06-12 DIAGNOSIS — Z951 Presence of aortocoronary bypass graft: Secondary | ICD-10-CM | POA: Diagnosis not present

## 2018-06-12 DIAGNOSIS — R0902 Hypoxemia: Secondary | ICD-10-CM | POA: Diagnosis not present

## 2018-06-12 DIAGNOSIS — R52 Pain, unspecified: Secondary | ICD-10-CM | POA: Diagnosis not present

## 2018-06-12 DIAGNOSIS — Y829 Unspecified medical devices associated with adverse incidents: Secondary | ICD-10-CM | POA: Insufficient documentation

## 2018-06-12 DIAGNOSIS — I2510 Atherosclerotic heart disease of native coronary artery without angina pectoris: Secondary | ICD-10-CM | POA: Insufficient documentation

## 2018-06-12 DIAGNOSIS — I130 Hypertensive heart and chronic kidney disease with heart failure and stage 1 through stage 4 chronic kidney disease, or unspecified chronic kidney disease: Secondary | ICD-10-CM | POA: Diagnosis not present

## 2018-06-12 DIAGNOSIS — Y998 Other external cause status: Secondary | ICD-10-CM | POA: Diagnosis not present

## 2018-06-12 DIAGNOSIS — Y9389 Activity, other specified: Secondary | ICD-10-CM | POA: Insufficient documentation

## 2018-06-12 DIAGNOSIS — I5032 Chronic diastolic (congestive) heart failure: Secondary | ICD-10-CM | POA: Insufficient documentation

## 2018-06-12 DIAGNOSIS — N183 Chronic kidney disease, stage 3 (moderate): Secondary | ICD-10-CM | POA: Insufficient documentation

## 2018-06-12 DIAGNOSIS — Z87891 Personal history of nicotine dependence: Secondary | ICD-10-CM | POA: Diagnosis not present

## 2018-06-12 DIAGNOSIS — I10 Essential (primary) hypertension: Secondary | ICD-10-CM | POA: Diagnosis not present

## 2018-06-12 DIAGNOSIS — W010XXA Fall on same level from slipping, tripping and stumbling without subsequent striking against object, initial encounter: Secondary | ICD-10-CM | POA: Insufficient documentation

## 2018-06-12 DIAGNOSIS — T84020A Dislocation of internal right hip prosthesis, initial encounter: Secondary | ICD-10-CM | POA: Diagnosis not present

## 2018-06-12 DIAGNOSIS — S79912A Unspecified injury of left hip, initial encounter: Secondary | ICD-10-CM | POA: Diagnosis not present

## 2018-06-12 MED ORDER — PROPOFOL 10 MG/ML IV BOLUS
50.0000 mg | Freq: Once | INTRAVENOUS | Status: AC
  Administered 2018-06-12: 50 mg via INTRAVENOUS
  Filled 2018-06-12: qty 20

## 2018-06-12 MED ORDER — SODIUM CHLORIDE 0.9 % IV SOLN
INTRAVENOUS | Status: AC | PRN
  Administered 2018-06-12: 1000 mL via INTRAVENOUS

## 2018-06-12 MED ORDER — PROPOFOL 10 MG/ML IV BOLUS
INTRAVENOUS | Status: AC | PRN
  Administered 2018-06-12 (×2): 30 mg via INTRAVENOUS
  Administered 2018-06-12: 50 mg via INTRAVENOUS
  Administered 2018-06-12: 30 mg via INTRAVENOUS

## 2018-06-12 MED ORDER — FENTANYL CITRATE (PF) 100 MCG/2ML IJ SOLN
50.0000 ug | Freq: Once | INTRAMUSCULAR | Status: AC
  Administered 2018-06-12: 50 ug via INTRAVENOUS
  Filled 2018-06-12: qty 2

## 2018-06-12 NOTE — Discharge Instructions (Addendum)
Please be sure to schedule follow-up appointment with your orthopedist. Return here for concerning changes in your condition.

## 2018-06-12 NOTE — ED Provider Notes (Signed)
[facility] DEPT Provider Note   CSN: [id number] Arrival date & time: 06/12/18  1208    History   Chief Complaint Chief Complaint  Patient presents with  . Hip Pain    HPI [name] is a 78 y.o. male.     HPI Patient presents after a fall with pain in his left hip. Patient is chronically ill, has multiple medical issues including left hip replacement about 3 months ago. He notes that he had been doing generally well until today, when he mechanical fall.  When he recalls slipping, falling onto his right knee. Since that time he has had pain in the left hip. He did not hit his head, has no loss of consciousness. Pain is sharp, severe. Notably, the patient has substantial chronic pain requirements, takes oxycodone, 30 mg, 5 times daily.  Past Medical History:  Diagnosis Date  . AAA (abdominal aortic aneurysm) ([place])   . Blindness of left eye   . 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 CAD (coronary artery disease)    a. s/p CABG in 10/2008 with LIMA-LAD, SVG-OM1 with Y-graft to PL branch, and SVG-RCA  . Carotid stenosis   . Cerebrovascular disease   . CHF (congestive heart failure) ([place])   . Cirrhosis ([place])    on CT a/p 07/2013, no longer drinking  . CONGENITAL UNSPEC REDUCTION DEFORMITY LOWER LIMB   . Constipation due to opioid therapy 2014   began about a year ago  . COPD (chronic obstructive pulmonary disease) ([place])   . Foley catheter in place   . HTN (hypertension)   . Hyperlipidemia   . 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 Neuromuscular disorder ([place])    PT STATES HE HAS BEEN TOLD HE EITHER HAD POLIO OR CEREBRAL PALSY - STATES HIS LEFT LEG IS SHORTER AND AFFECTS HIS BALANCE - HE WEARS ELEVATED SHOE -LEFT  . Pain    BACK, LEGS AND ARMS  . Shortness of breath    EXERTION  . TOBACCO USE, QUIT     Patient Active  Problem List   Diagnosis Date Noted  . Closed left hip fracture, initial encounter ([place]) 01/26/2018  . Neck pain 12/18/2017  . Right hip pain 06/22/2017  . Chronic diastolic heart failure ([place]) 03/16/2017  . CKD (chronic kidney disease) stage 3, GFR 30-59 ml/min ([facility]) 03/16/2017  . Diastolic dysfunction 71/09/2692  . Opiate dependence ([place]) 02/13/2017  . Uremia 01/21/2017  . Acute renal failure superimposed on stage 3 chronic kidney disease ([place]) 01/21/2017  . Atrial flutter ([place]) 11/24/2014  . Aortic stenosis 03/24/2014  . Second degree AV block, Mobitz type I 09/06/2013  . Cirrhosis ([place]) 07/28/2013  . Bladder outlet obstruction 07/28/2013  . Impaired glucose metabolism   . Chronic back pain   . Benign neoplasm of colon 01/08/2011  . Abdominal aortic aneurysm ([place]) 08/12/2010  . TOBACCO USE, QUIT 04/11/2009  . Occlusion and stenosis of carotid artery 01/08/2009  . Hyperlipidemia 11/13/2008  . ANEMIA 11/13/2008  . Essential hypertension 11/13/2008  . Coronary atherosclerosis 11/13/2008  . COPD (chronic obstructive pulmonary disease) ([place]) 11/13/2008    Past Surgical History:  Procedure Laterality Date  . CORONARY ARTERY BYPASS GRAFT  11/03/2008   Ricard Dillon - x4: left internal mammary artery to the distal left anterior descending, saphemous vein graft to the first circumflex marginal branch with a Y graft sequentiallly to a left  posterolateral branch, saphenous vein graft to the distal right coronary artery  . ELECTROPHYSIOLOGIC STUDY N/A 01/18/2015   Procedure: A-Flutter Ablation;  Surgeon: Deboraha Sprang, MD;  Location: [facility];  Service: Cardiovascular;  Laterality: N/A;  . GREEN LIGHT LASER TURP (TRANSURETHRAL RESECTION OF PROSTATE N/A 02/14/2014   Procedure: GREEN LIGHT LASER TURP (TRANSURETHRAL RESECTION OF PROSTATE;  Surgeon: Festus Aloe, MD;  Location: [facility];  Service: Urology;  Laterality: N/A;  . HIP ARTHROPLASTY Left 01/26/2018   Procedure: LEFT HIP  POSTERIOR HEMIARTHROPLASTY;  Surgeon: [name], MD;  Location: [facility];  Service: Orthopedics;  Laterality: Left;        Home Medications    Prior to Admission medications   Medication Sig Start Date End Date Taking? Authorizing Provider  aspirin 81 MG tablet Take 81 mg by mouth daily.    [provider]  finasteride (PROSCAR) 5 MG tablet Take 5 mg by mouth daily.    [provider]  fluticasone (FLONASE) 50 MCG/ACT nasal spray PLACE 1 SPRAY INTO BOTH NOSTRILS EVERY MORNING. Patient taking differently: Place 1 spray into both nostrils daily.  12/07/17   [name], MD  furosemide (LASIX) 40 MG tablet TAKE 2 PILLS IN THE MORNING AND 1 PILL IN THE AFTERNOON. Patient taking differently: Take 40-80 mg by mouth 2 (two) times daily. Take 80mg  in the am and 40mg  in the pm 02/15/18   [name], MD  Guaifenesin 1200 MG TB12 Take 1 tablet (1,200 mg total) by mouth 2 (two) times daily. 02/15/18   [name], MD  hydrALAZINE (APRESOLINE) 10 MG tablet Take 1 tablet (10 mg total) by mouth 3 (three) times daily. 03/01/18   Lelon Perla, MD  hydrALAZINE (APRESOLINE) 10 MG tablet TAKE 1 TABLET BY MOUTH 3 TIMES A DAY Patient taking differently: Take 10 mg by mouth 3 (three) times daily.  03/30/18   Lelon Perla, MD  ibuprofen (ADVIL,MOTRIN) 200 MG tablet Take 400 mg by mouth every 6 (six) hours as needed for moderate pain.    [provider]  KLOR-CON M20 20 MEQ tablet TAKE 2 TABLETS (40 MEQ TOTAL) BY MOUTH 2 (TWO) TIMES DAILY. Patient taking differently: Take 40 mEq by mouth 2 (two) times daily.  04/29/18   Lelon Perla, MD  lactulose (CHRONULAC) 10 GM/15ML solution TAKE 30 MLS BY MOUTH 2 TIMES DAILY AS NEEDED FOR MILD CONSTIPATION OR MODERATE CONSTIPATION Patient taking differently: Take 20 g by mouth 2 (two) times daily as needed for mild constipation.  06/01/18   [name], MD  nystatin ointment (MYCOSTATIN) Apply 1  application topically 2 (two) times daily. 04/15/18   [name], MD  oxyCODONE HCl 30 MG TABA Take 30 mg by mouth every 4 (four) hours.    [provider]  pravastatin (PRAVACHOL) 20 MG tablet TAKE 1 TABLET BY MOUTH EVERY DAY Patient taking differently: Take 20 mg by mouth daily.  01/12/18   [name], MD  pregabalin (LYRICA) 100 MG capsule Take 1 capsule (100 mg total) by mouth 2 (two) times daily. 03/29/18   Kathrynn Ducking, MD  sertraline (ZOLOFT) 25 MG tablet TAKE 1 TABLET BY MOUTH EVERYDAY AT BEDTIME Patient taking differently: Take 25 mg by mouth daily as needed. Helps with sleep 06/05/17   [name], MD  spironolactone (ALDACTONE) 25 MG tablet TAKE 1 TABLET BY MOUTH EVERY DAY Patient taking differently: Take 25 mg by mouth daily.  04/08/18  [name], MD  tamsulosin (FLOMAX) 0.4 MG CAPS capsule TAKE 1 CAPSULE BY MOUTH EVERY DAY Patient taking differently: Take 0.4 mg by mouth daily.  04/05/18   [name], MD  tiZANidine (ZANAFLEX) 2 MG tablet TAKE 1 TABLET (2 MG TOTAL) BY MOUTH 3 (THREE) TIMES DAILY. 05/17/18   Kathrynn Ducking, MD  VENTOLIN HFA 108 (90 Base) MCG/ACT inhaler TAKE 2 PUFFS BY MOUTH EVERY 6 HOURS AS NEEDED FOR WHEEZE Patient taking differently: Inhale 2 puffs into the lungs every 6 (six) hours as needed for wheezing.  12/01/17   [name], MD    Family History Family History  Problem Relation Age of Onset  . Cirrhosis Mother        died at 49  . Heart attack Father   . Other Brother        GSW  . Other Brother        died in house fire  . Cancer Brother        unsure of type Believes colon or prostate/fim  . Colon cancer Neg Hx   . Stomach cancer Neg Hx     Social History Social History   Tobacco Use  . Smoking status: Former Research scientist (life sciences)  . Smokeless tobacco: Never Used  Substance Use Topics  . Alcohol use: No    Alcohol/week: 0.0 standard drinks    Comment: History of heavy  alcohol use per pt. Quit many years ago1/1/ 2005  . Drug use: No     Allergies   Doxycycline; Amlodipine; Fish allergy; Hydrocodone; Other; and Tylenol [acetaminophen]   Review of Systems Review of Systems  Constitutional:       Per HPI, otherwise negative  HENT:       Per HPI, otherwise negative  Respiratory:       Per HPI, otherwise negative  Cardiovascular:       Per HPI, otherwise negative  Gastrointestinal: Negative for vomiting.  Endocrine:       Negative aside from HPI  Genitourinary:       Neg aside from HPI   Musculoskeletal:       Per HPI, otherwise negative  Skin: Positive for wound.  Neurological: Positive for weakness. Negative for syncope.     Physical Exam Updated Vital Signs BP (!) 160/74   Pulse 63   Temp (!) 97.3 F (36.3 C) (Oral)   Resp 15   Ht 5\' 11"  (1.803 m)   Wt 74.8 kg   SpO2 100%   BMI 23.01 kg/m   Physical Exam Vitals signs and nursing note reviewed.  Constitutional:      Appearance: He is well-developed. He is ill-appearing and diaphoretic.  HENT:     Head: Normocephalic and atraumatic.  Eyes:     Conjunctiva/sclera: Conjunctivae normal.  Cardiovascular:     Rate and Rhythm: Normal rate and regular rhythm.  Pulmonary:     Effort: Pulmonary effort is normal. No respiratory distress.     Breath sounds: No stridor.  Abdominal:     General: There is no distension.  Musculoskeletal:     Comments: Patient keeping both hips in flexion, unwillingness to move the hips bilaterally, secondary to pain in the left hip. Right knee with superficial abrasion, but no tenderness palpation, no gross deformity.  Skin:    General: Skin is warm.  Neurological:     Mental Status: He is alert and oriented to person, place, and time.      ED Treatments /  Results   Radiology Dg Hip Port Unilat W Or Wo Pelvis 1 View Left  Result Date: 06/12/2018 CLINICAL DATA:  Postreduction left hip EXAM: DG HIP (WITH OR WITHOUT PELVIS) 1V PORT LEFT  COMPARISON:  06/12/2018 FINDINGS: Interval reduction of the previously dislocated left hip replacement. Normal AP alignment. No visible fracture. IMPRESSION: Interval reduction.  No visible dislocation or fracture. Electronically Signed   By: Rolm Baptise M.D.   On: 06/12/2018 15:12   Dg Hip Unilat W Or Wo Pelvis 2-3 Views Left  Result Date: 06/12/2018 CLINICAL DATA:  Left hip pain after fall. EXAM: DG HIP (WITH OR WITHOUT PELVIS) 2-3V LEFT COMPARISON:  Pelvic x-ray dated January 26, 2018. FINDINGS: Superior dislocation of the left hip hemiarthroplasty. No definite fracture. The right hip joint space is preserved. The pubic symphysis and sacroiliac joints are intact. IMPRESSION: 1. Left hip hemiarthroplasty dislocation.  No definite fracture. Electronically Signed   By: Titus Dubin M.D.   On: 06/12/2018 13:23    Procedures .Sedation Date/Time: 06/12/2018 2:30 PM Performed by: Carmin Muskrat, MD Authorized by: Carmin Muskrat, MD   Consent:    Consent obtained:  Verbal   Consent given by:  Patient   Risks discussed:  Allergic reaction, dysrhythmia, inadequate sedation, nausea, prolonged hypoxia resulting in organ damage, prolonged sedation necessitating reversal, respiratory compromise necessitating ventilatory assistance and intubation and vomiting   Alternatives discussed:  Analgesia without sedation, anxiolysis and regional anesthesia Universal protocol:    Procedure explained and questions answered to patient or proxy's satisfaction: yes     Relevant documents present and verified: yes     Test results available and properly labeled: yes     Imaging studies available: yes     Required blood products, implants, devices, and special equipment available: yes     Site/side marked: yes     Immediately prior to procedure a time out was called: yes     Patient identity confirmation method:  Verbally with patient Indications:    Procedure necessitating sedation performed by:  Physician  performing sedation Pre-sedation assessment:    Time since last food or drink:  2   ASA classification: class 2 - patient with mild systemic disease     Neck mobility: reduced     Mouth opening:  2 finger widths   Thyromental distance:  3 finger widths   Mallampati score:  I - soft palate, uvula, fauces, pillars visible   Pre-sedation assessments completed and reviewed: airway patency, cardiovascular function, hydration status, mental status, nausea/vomiting, pain level, respiratory function and temperature     Pre-sedation assessment completed:  06/12/2018 2:30 PM Immediate pre-procedure details:    Reassessment: Patient reassessed immediately prior to procedure     Reviewed: vital signs, relevant labs/tests and NPO status     Verified: bag valve mask available, emergency equipment available, intubation equipment available, IV patency confirmed, oxygen available and suction available   Procedure details (see MAR for exact dosages):    Preoxygenation:  Nasal cannula   Sedation:  Propofol   Intra-procedure monitoring:  Blood pressure monitoring, cardiac monitor, continuous pulse oximetry, frequent LOC assessments, frequent vital sign checks and continuous capnometry   Intra-procedure events: none     Total Provider sedation time (minutes):  25 Post-procedure details:    Post-sedation assessment completed:  06/12/2018 3:23 PM   Attendance: Constant attendance by certified staff until patient recovered     Recovery: Patient returned to pre-procedure baseline     Post-sedation assessments completed and reviewed: airway  patency, cardiovascular function, hydration status, mental status, nausea/vomiting, pain level, respiratory function and temperature     Patient is stable for discharge or admission: yes     Patient tolerance:  Tolerated well, no immediate complications .Ortho Injury Treatment Date/Time: 06/12/2018 2:40 PM Performed by: Carmin Muskrat, MD Authorized by: Carmin Muskrat, MD    Consent:    Consent obtained:  Verbal and emergent situation   Consent given by:  Patient   Risks discussed:  Fracture, irreducible dislocation, stiffness, restricted joint movement and nerve damage   Alternatives discussed:  No treatment and alternative treatmentInjury location: hip Location details: left hip Injury type: dislocation Dislocation type: posterior Spontaneous dislocation: yes Prosthesis: yes Pre-procedure neurovascular assessment: neurovascularly intact Pre-procedure distal perfusion: normal Pre-procedure neurological function: normal Pre-procedure range of motion: reduced  Anesthesia: Local anesthesia used: no  Patient sedated: Yes. Refer to sedation procedure documentation for details of sedation. Manipulation performed: yes Reduction method: traction and counter traction Reduction successful: yes X-ray confirmed reduction: yes Splint type: long leg Supplies used: aluminum splint Post-procedure neurovascular assessment: post-procedure neurovascularly intact Post-procedure distal perfusion: normal Post-procedure neurological function: normal Post-procedure range of motion: improved Patient tolerance: Patient tolerated the procedure well with no immediate complications    (including critical care time)  Medications Ordered in ED Medications  fentaNYL (SUBLIMAZE) injection 50 mcg (50 mcg Intravenous Given 06/12/18 1359)  propofol (DIPRIVAN) 10 mg/mL bolus/IV push 50 mg (50 mg Intravenous Given 06/12/18 1419)  propofol (DIPRIVAN) 10 mg/mL bolus/IV push (30 mg Intravenous Given 06/12/18 1430)  0.9 %  sodium chloride infusion (1,000 mLs Intravenous New Bag/Given 06/12/18 1415)     Initial Impression / Assessment and Plan / ED Course  I have reviewed the triage vital signs and the nursing notes.  Pertinent labs & imaging results that were available during my care of the patient were reviewed by me and considered in my medical decision making (see chart for  details).        3:21 PM Patient smiling, appreciative.  He notes that he feels substantially better. Review of postreduction film shows dislocated hip, now appropriately reduced. Patient has been placed in a knee immobilizer, acknowledges importance of keeping it in place when not in bed, following up with his orthopedist.  Final Clinical Impressions(s) / ED Diagnoses   Final diagnoses:  Dislocation of internal left hip prosthesis, initial encounter [facility])      Carmin Muskrat, MD 06/12/18 1549

## 2018-06-12 NOTE — ED Notes (Signed)
Bed: WA08 Expected date:  Expected time:  Means of arrival:  Comments: 78 yo fall, possible hip dislocation

## 2018-06-12 NOTE — ED Notes (Signed)
Patient transported to X-ray 

## 2018-06-12 NOTE — ED Triage Notes (Signed)
Transported by [facility] EMS from home. Had left hip replacement 10/19. Experienced a fall this morning @ 0530 +pain to left hip, shortening. VSS. EMS administered 100 mcg fentanyl PTA

## 2018-06-21 DIAGNOSIS — I10 Essential (primary) hypertension: Secondary | ICD-10-CM | POA: Diagnosis not present

## 2018-06-21 DIAGNOSIS — R52 Pain, unspecified: Secondary | ICD-10-CM | POA: Diagnosis not present

## 2018-06-22 ENCOUNTER — Emergency Department (HOSPITAL_COMMUNITY): Payer: Medicare Other

## 2018-06-22 ENCOUNTER — Inpatient Hospital Stay (HOSPITAL_COMMUNITY)
Admission: EM | Admit: 2018-06-22 | Discharge: 2018-06-28 | DRG: 467 | Disposition: A | Discharge status: Home Health | Payer: Medicare Other | Attending: Internal Medicine | Admitting: Internal Medicine

## 2018-06-22 ENCOUNTER — Observation Stay (HOSPITAL_COMMUNITY): Payer: Medicare Other

## 2018-06-22 ENCOUNTER — Other Ambulatory Visit: Payer: Self-pay | Admitting: Internal Medicine

## 2018-06-22 ENCOUNTER — Emergency Department (HOSPITAL_COMMUNITY)
Admission: EM | Admit: 2018-06-22 | Discharge: 2018-06-22 | Disposition: A | Discharge status: Home / Self Care | Payer: Medicare Other | Source: Home / Self Care | Attending: Emergency Medicine | Admitting: Emergency Medicine

## 2018-06-22 ENCOUNTER — Other Ambulatory Visit: Payer: Self-pay

## 2018-06-22 ENCOUNTER — Encounter (HOSPITAL_COMMUNITY): Payer: Self-pay | Admitting: *Deleted

## 2018-06-22 DIAGNOSIS — I714 Abdominal aortic aneurysm, without rupture, unspecified: Secondary | ICD-10-CM | POA: Diagnosis present

## 2018-06-22 DIAGNOSIS — Z87891 Personal history of nicotine dependence: Secondary | ICD-10-CM

## 2018-06-22 DIAGNOSIS — I480 Paroxysmal atrial fibrillation: Secondary | ICD-10-CM | POA: Diagnosis present

## 2018-06-22 DIAGNOSIS — Z8249 Family history of ischemic heart disease and other diseases of the circulatory system: Secondary | ICD-10-CM

## 2018-06-22 DIAGNOSIS — G894 Chronic pain syndrome: Secondary | ICD-10-CM | POA: Diagnosis present

## 2018-06-22 DIAGNOSIS — N400 Enlarged prostate without lower urinary tract symptoms: Secondary | ICD-10-CM | POA: Diagnosis present

## 2018-06-22 DIAGNOSIS — Z96642 Presence of left artificial hip joint: Secondary | ICD-10-CM

## 2018-06-22 DIAGNOSIS — I5032 Chronic diastolic (congestive) heart failure: Secondary | ICD-10-CM

## 2018-06-22 DIAGNOSIS — E785 Hyperlipidemia, unspecified: Secondary | ICD-10-CM | POA: Diagnosis present

## 2018-06-22 DIAGNOSIS — I361 Nonrheumatic tricuspid (valve) insufficiency: Secondary | ICD-10-CM | POA: Diagnosis not present

## 2018-06-22 DIAGNOSIS — M549 Dorsalgia, unspecified: Secondary | ICD-10-CM | POA: Diagnosis not present

## 2018-06-22 DIAGNOSIS — I472 Ventricular tachycardia: Secondary | ICD-10-CM | POA: Diagnosis not present

## 2018-06-22 DIAGNOSIS — G809 Cerebral palsy, unspecified: Secondary | ICD-10-CM | POA: Diagnosis present

## 2018-06-22 DIAGNOSIS — T84498A Other mechanical complication of other internal orthopedic devices, implants and grafts, initial encounter: Secondary | ICD-10-CM | POA: Diagnosis not present

## 2018-06-22 DIAGNOSIS — T84028A Dislocation of other internal joint prosthesis, initial encounter: Secondary | ICD-10-CM

## 2018-06-22 DIAGNOSIS — K5900 Constipation, unspecified: Secondary | ICD-10-CM | POA: Diagnosis not present

## 2018-06-22 DIAGNOSIS — S73005A Unspecified dislocation of left hip, initial encounter: Secondary | ICD-10-CM

## 2018-06-22 DIAGNOSIS — I10 Essential (primary) hypertension: Secondary | ICD-10-CM | POA: Diagnosis not present

## 2018-06-22 DIAGNOSIS — J449 Chronic obstructive pulmonary disease, unspecified: Secondary | ICD-10-CM | POA: Diagnosis present

## 2018-06-22 DIAGNOSIS — Z7982 Long term (current) use of aspirin: Secondary | ICD-10-CM

## 2018-06-22 DIAGNOSIS — Y9389 Activity, other specified: Secondary | ICD-10-CM | POA: Insufficient documentation

## 2018-06-22 DIAGNOSIS — T84021A Dislocation of internal left hip prosthesis, initial encounter: Secondary | ICD-10-CM | POA: Diagnosis not present

## 2018-06-22 DIAGNOSIS — Y998 Other external cause status: Secondary | ICD-10-CM | POA: Insufficient documentation

## 2018-06-22 DIAGNOSIS — I130 Hypertensive heart and chronic kidney disease with heart failure and stage 1 through stage 4 chronic kidney disease, or unspecified chronic kidney disease: Secondary | ICD-10-CM

## 2018-06-22 DIAGNOSIS — Y9289 Other specified places as the place of occurrence of the external cause: Secondary | ICD-10-CM

## 2018-06-22 DIAGNOSIS — X58XXXA Exposure to other specified factors, initial encounter: Secondary | ICD-10-CM

## 2018-06-22 DIAGNOSIS — R52 Pain, unspecified: Secondary | ICD-10-CM | POA: Diagnosis not present

## 2018-06-22 DIAGNOSIS — I441 Atrioventricular block, second degree: Secondary | ICD-10-CM | POA: Diagnosis present

## 2018-06-22 DIAGNOSIS — Z951 Presence of aortocoronary bypass graft: Secondary | ICD-10-CM

## 2018-06-22 DIAGNOSIS — N183 Chronic kidney disease, stage 3 unspecified: Secondary | ICD-10-CM | POA: Diagnosis present

## 2018-06-22 DIAGNOSIS — I252 Old myocardial infarction: Secondary | ICD-10-CM | POA: Insufficient documentation

## 2018-06-22 DIAGNOSIS — I2510 Atherosclerotic heart disease of native coronary artery without angina pectoris: Secondary | ICD-10-CM | POA: Diagnosis not present

## 2018-06-22 DIAGNOSIS — W19XXXA Unspecified fall, initial encounter: Secondary | ICD-10-CM | POA: Diagnosis present

## 2018-06-22 DIAGNOSIS — N320 Bladder-neck obstruction: Secondary | ICD-10-CM | POA: Diagnosis present

## 2018-06-22 DIAGNOSIS — K7460 Unspecified cirrhosis of liver: Secondary | ICD-10-CM | POA: Diagnosis not present

## 2018-06-22 DIAGNOSIS — F1120 Opioid dependence, uncomplicated: Secondary | ICD-10-CM | POA: Diagnosis present

## 2018-06-22 DIAGNOSIS — I6529 Occlusion and stenosis of unspecified carotid artery: Secondary | ICD-10-CM | POA: Diagnosis not present

## 2018-06-22 DIAGNOSIS — Z01810 Encounter for preprocedural cardiovascular examination: Secondary | ICD-10-CM

## 2018-06-22 DIAGNOSIS — Z96649 Presence of unspecified artificial hip joint: Secondary | ICD-10-CM

## 2018-06-22 DIAGNOSIS — S79912A Unspecified injury of left hip, initial encounter: Secondary | ICD-10-CM | POA: Diagnosis not present

## 2018-06-22 DIAGNOSIS — M24451 Recurrent dislocation, right hip: Secondary | ICD-10-CM | POA: Diagnosis not present

## 2018-06-22 DIAGNOSIS — Z4682 Encounter for fitting and adjustment of non-vascular catheter: Secondary | ICD-10-CM | POA: Diagnosis not present

## 2018-06-22 DIAGNOSIS — G8929 Other chronic pain: Secondary | ICD-10-CM | POA: Diagnosis present

## 2018-06-22 DIAGNOSIS — R001 Bradycardia, unspecified: Secondary | ICD-10-CM | POA: Diagnosis not present

## 2018-06-22 DIAGNOSIS — R748 Abnormal levels of other serum enzymes: Secondary | ICD-10-CM | POA: Diagnosis not present

## 2018-06-22 DIAGNOSIS — M25552 Pain in left hip: Secondary | ICD-10-CM

## 2018-06-22 DIAGNOSIS — Z79899 Other long term (current) drug therapy: Secondary | ICD-10-CM

## 2018-06-22 DIAGNOSIS — I350 Nonrheumatic aortic (valve) stenosis: Secondary | ICD-10-CM

## 2018-06-22 DIAGNOSIS — R0902 Hypoxemia: Secondary | ICD-10-CM | POA: Diagnosis not present

## 2018-06-22 DIAGNOSIS — I340 Nonrheumatic mitral (valve) insufficiency: Secondary | ICD-10-CM | POA: Diagnosis not present

## 2018-06-22 LAB — BASIC METABOLIC PANEL
Anion gap: 7 (ref 5–15)
BUN: 34 mg/dL — ABNORMAL HIGH (ref 8–23)
CO2: 24 mmol/L (ref 22–32)
Calcium: 8.4 mg/dL — ABNORMAL LOW (ref 8.9–10.3)
Chloride: 107 mmol/L (ref 98–111)
Creatinine, Ser: 1.34 mg/dL — ABNORMAL HIGH (ref 0.61–1.24)
GFR calc Af Amer: 60 mL/min — ABNORMAL LOW (ref >60)
GFR calc non Af Amer: 51 mL/min — ABNORMAL LOW (ref >60)
Glucose, Bld: 132 mg/dL — ABNORMAL HIGH (ref 70–99)
Potassium: 3.3 mmol/L — ABNORMAL LOW (ref 3.5–5.1)
Sodium: 138 mmol/L (ref 135–145)

## 2018-06-22 LAB — CBC WITH DIFFERENTIAL/PLATELET
Abs Immature Granulocytes: 0.03 10*3/uL (ref 0.00–0.07)
Basophils Absolute: 0 10*3/uL (ref 0.0–0.1)
Basophils Relative: 1 %
Eosinophils Absolute: 0.1 10*3/uL (ref 0.0–0.5)
Eosinophils Relative: 1 %
HCT: 34 % — ABNORMAL LOW (ref 39.0–52.0)
Hemoglobin: 10.2 g/dL — ABNORMAL LOW (ref 13.0–17.0)
Immature Granulocytes: 0 %
Lymphocytes Relative: 12 %
Lymphs Abs: 0.9 10*3/uL (ref 0.7–4.0)
MCH: 26.3 pg (ref 26.0–34.0)
MCHC: 30 g/dL (ref 30.0–36.0)
MCV: 87.6 fL (ref 80.0–100.0)
Monocytes Absolute: 0.5 10*3/uL (ref 0.1–1.0)
Monocytes Relative: 7 %
Neutro Abs: 5.7 10*3/uL (ref 1.7–7.7)
Neutrophils Relative %: 79 %
Platelets: 244 10*3/uL (ref 150–400)
RBC: 3.88 MIL/uL — ABNORMAL LOW (ref 4.22–5.81)
RDW: 16.6 % — ABNORMAL HIGH (ref 11.5–15.5)
WBC: 7.2 10*3/uL (ref 4.0–10.5)
nRBC: 0 % (ref 0.0–0.2)

## 2018-06-22 MED ORDER — HEPARIN SODIUM (PORCINE) 5000 UNIT/ML IJ SOLN
5000.0000 [IU] | Freq: Three times a day (TID) | INTRAMUSCULAR | Status: DC
  Administered 2018-06-22 – 2018-06-28 (×15): 5000 [IU] via SUBCUTANEOUS
  Filled 2018-06-22 (×15): qty 1

## 2018-06-22 MED ORDER — FLUTICASONE PROPIONATE 50 MCG/ACT NA SUSP
1.0000 | Freq: Every day | NASAL | Status: DC
  Administered 2018-06-22 – 2018-06-28 (×7): 1 via NASAL
  Filled 2018-06-22: qty 16

## 2018-06-22 MED ORDER — FINASTERIDE 5 MG PO TABS
5.0000 mg | ORAL_TABLET | Freq: Every day | ORAL | Status: DC
  Administered 2018-06-22 – 2018-06-28 (×7): 5 mg via ORAL
  Filled 2018-06-22 (×8): qty 1

## 2018-06-22 MED ORDER — FENTANYL CITRATE (PF) 100 MCG/2ML IJ SOLN
100.0000 ug | Freq: Once | INTRAMUSCULAR | Status: AC
  Administered 2018-06-22: 100 ug via INTRAVENOUS
  Filled 2018-06-22: qty 2

## 2018-06-22 MED ORDER — POLYETHYLENE GLYCOL 3350 17 G PO PACK
17.0000 g | PACK | Freq: Every day | ORAL | Status: DC
  Administered 2018-06-23 – 2018-06-28 (×4): 17 g via ORAL
  Filled 2018-06-22 (×6): qty 1

## 2018-06-22 MED ORDER — POTASSIUM CHLORIDE CRYS ER 20 MEQ PO TBCR
40.0000 meq | EXTENDED_RELEASE_TABLET | Freq: Once | ORAL | Status: AC
  Administered 2018-06-22: 40 meq via ORAL
  Filled 2018-06-22: qty 2

## 2018-06-22 MED ORDER — PROPOFOL 10 MG/ML IV BOLUS
INTRAVENOUS | Status: AC | PRN
  Administered 2018-06-22: 40 mg via INTRAVENOUS
  Administered 2018-06-22: 30 mg via INTRAVENOUS
  Administered 2018-06-22 (×2): 37.4 mg via INTRAVENOUS

## 2018-06-22 MED ORDER — FUROSEMIDE 40 MG PO TABS
80.0000 mg | ORAL_TABLET | Freq: Every day | ORAL | Status: DC
  Administered 2018-06-22 – 2018-06-28 (×7): 80 mg via ORAL
  Filled 2018-06-22 (×8): qty 2

## 2018-06-22 MED ORDER — BISACODYL 10 MG RE SUPP
10.0000 mg | Freq: Every day | RECTAL | Status: DC | PRN
  Filled 2018-06-22: qty 1

## 2018-06-22 MED ORDER — METHOCARBAMOL 1000 MG/10ML IJ SOLN
500.0000 mg | Freq: Four times a day (QID) | INTRAVENOUS | Status: DC | PRN
  Filled 2018-06-22: qty 5

## 2018-06-22 MED ORDER — PROPOFOL 10 MG/ML IV BOLUS
0.5000 mg/kg | Freq: Once | INTRAVENOUS | Status: AC
  Administered 2018-06-22: 37.4 mg via INTRAVENOUS
  Filled 2018-06-22: qty 20

## 2018-06-22 MED ORDER — DOCUSATE SODIUM 100 MG PO CAPS
100.0000 mg | ORAL_CAPSULE | Freq: Two times a day (BID) | ORAL | Status: DC
  Administered 2018-06-22 – 2018-06-28 (×10): 100 mg via ORAL
  Filled 2018-06-22 (×13): qty 1

## 2018-06-22 MED ORDER — ASPIRIN 81 MG PO CHEW
81.0000 mg | CHEWABLE_TABLET | Freq: Every day | ORAL | Status: DC
  Administered 2018-06-22 – 2018-06-28 (×7): 81 mg via ORAL
  Filled 2018-06-22 (×7): qty 1

## 2018-06-22 MED ORDER — SERTRALINE HCL 25 MG PO TABS: 25.0000 mg | ORAL_TABLET | Freq: Every evening | ORAL | Status: DC | PRN

## 2018-06-22 MED ORDER — MORPHINE SULFATE (PF) 4 MG/ML IV SOLN
4.0000 mg | Freq: Once | INTRAVENOUS | Status: AC
  Administered 2018-06-22: 4 mg via INTRAVENOUS
  Filled 2018-06-22: qty 1

## 2018-06-22 MED ORDER — OXYCODONE HCL 5 MG PO TABS
30.0000 mg | ORAL_TABLET | Freq: Every day | ORAL | Status: DC | PRN
  Administered 2018-06-22 – 2018-06-28 (×23): 30 mg via ORAL
  Filled 2018-06-22 (×23): qty 6

## 2018-06-22 MED ORDER — LACTULOSE 10 GM/15ML PO SOLN: 20.0000 g | Freq: Two times a day (BID) | ORAL | Status: DC | PRN

## 2018-06-22 MED ORDER — TAMSULOSIN HCL 0.4 MG PO CAPS
0.4000 mg | ORAL_CAPSULE | Freq: Every day | ORAL | Status: DC
  Administered 2018-06-22 – 2018-06-28 (×7): 0.4 mg via ORAL
  Filled 2018-06-22 (×7): qty 1

## 2018-06-22 MED ORDER — METHOCARBAMOL 500 MG PO TABS
500.0000 mg | ORAL_TABLET | Freq: Four times a day (QID) | ORAL | Status: DC | PRN
  Administered 2018-06-24: 500 mg via ORAL
  Filled 2018-06-22: qty 1

## 2018-06-22 MED ORDER — SPIRONOLACTONE 25 MG PO TABS
25.0000 mg | ORAL_TABLET | Freq: Every day | ORAL | Status: DC
  Administered 2018-06-22 – 2018-06-28 (×7): 25 mg via ORAL
  Filled 2018-06-22 (×7): qty 1

## 2018-06-22 NOTE — ED Notes (Signed)
Pt in chair for comfort.

## 2018-06-22 NOTE — ED Notes (Signed)
Bed: WA05 Expected date:  Expected time:  Means of arrival:  Comments: EMS-fall-hip pain 

## 2018-06-22 NOTE — ED Notes (Signed)
Pt resting in chair due to comfort. Awaiting provider. Reporting increased pain in L hip, provider notified. Given warm blanket and plan of care update.

## 2018-06-22 NOTE — ED Notes (Signed)
Respiratory and Ortho called for conscious sedation.

## 2018-06-22 NOTE — H&P (Signed)
[facility] History and Physical   Patient: [name]   PCP: [name], MD DOB: 30-Dec-1940   DOA: 06/22/2018   DOS: 06/22/2018   DOS: the patient was seen and examined on 06/22/2018  Patient coming from: The patient is coming from home  Chief Complaint: hip pain  HPI: [name] is a 78 y.o. male with Past medical history of AAA, CAD S/P CABG, carotid stenosis, CVA, chronic diastolic CHF, cirrhosis of the liver, COPD, HTN, HLD, Mobitz type I AV block. The patient presents with complaints of hip pain. Underwent left hip arthroplasty October 2019.  After that patient was seen in the ER hip on 06/12/2018.  Patient was earlier seen in the morning of 06/22/2018 for hip pain and was found to have dislocation of the hip which was corrected and patient was sent home. Patient comes to the hospital again with dislocation of the left hip with hip pain. Given conscious sedation and reduction was performed in the ER. Patient denies having any trauma, fall, injury.  No fever no chills.  No chest pain abdominal pain at the time of my evaluation. Patient does have chronic back pain as well as shoulder pain. No diarrhea reported patient actually has constipation. No recent change in medications reported as well. Patient tells me that when he saw his primary cardiologist he was informed that he may require a pacemaker in the near future.  Patient reports compliant with all his medication including pain medication.  No alcohol or drug abuse reported by the patient as well.  ED Course: Patient's hip was reduced in the ER under conscious sedation.  EDP discussed with Dr. Alvan Dame who recommends the patient to be admitted in the hospital.  No further plan available from orthopedic providers.  At his baseline ambulates with assistance And is independent for most of his ADL; manages his medication on his own.  Review of Systems: as mentioned in the history of present illness.   All other systems reviewed and are negative.  Past Medical History:  Diagnosis Date  . AAA (abdominal aortic aneurysm) ([place])   . Blindness of left eye   . 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 CAD (coronary artery disease)    a. s/p CABG in 10/2008 with LIMA-LAD, SVG-OM1 with Y-graft to PL branch, and SVG-RCA  . Carotid stenosis   . Cerebrovascular disease   . CHF (congestive heart failure) ([place])   . Cirrhosis ([place])    on CT a/p 07/2013, no longer drinking  . CONGENITAL UNSPEC REDUCTION DEFORMITY LOWER LIMB   . Constipation due to opioid therapy 2014   began about a year ago  . COPD (chronic obstructive pulmonary disease) ([place])   . Foley catheter in place   . HTN (hypertension)   . Hyperlipidemia   . 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 Neuromuscular disorder ([place])    PT STATES HE HAS BEEN TOLD HE EITHER HAD POLIO OR CEREBRAL PALSY - STATES HIS LEFT LEG IS SHORTER AND AFFECTS HIS BALANCE - HE WEARS ELEVATED SHOE -LEFT  . Pain    BACK, LEGS AND ARMS  . Shortness of breath    EXERTION  . TOBACCO USE, QUIT    Past Surgical History:  Procedure Laterality Date  . CORONARY ARTERY BYPASS GRAFT  11/03/2008   Ricard Dillon - x4: left internal mammary artery to the distal left anterior descending,  saphemous vein graft to the first circumflex marginal branch with a Y graft sequentiallly to a left posterolateral branch, saphenous vein graft to the distal right coronary artery  . ELECTROPHYSIOLOGIC STUDY N/A 01/18/2015   Procedure: A-Flutter Ablation;  Surgeon: Deboraha Sprang, MD;  Location: [facility];  Service: Cardiovascular;  Laterality: N/A;  . GREEN LIGHT LASER TURP (TRANSURETHRAL RESECTION OF PROSTATE N/A 02/14/2014   Procedure: GREEN LIGHT LASER TURP (TRANSURETHRAL RESECTION OF PROSTATE;  Surgeon: Festus Aloe, MD;  Location: [facility];  Service: Urology;  Laterality:  N/A;  . HIP ARTHROPLASTY Left 01/26/2018   Procedure: LEFT HIP POSTERIOR HEMIARTHROPLASTY;  Surgeon: [name], MD;  Location: [facility];  Service: Orthopedics;  Laterality: Left;   Social History:  reports that he has quit smoking. He has never used smokeless tobacco. He reports that he does not drink alcohol or use drugs.  Allergies  Allergen Reactions  . Doxycycline   . Amlodipine Nausea And Vomiting and Other (See Comments)    dizziness  . Fish Allergy Nausea And Vomiting    STATES HE HAS NOT EATEN ANY FISH INCLUDING SHELLFISH FOR PAST 40 YRS - IT CAUSED NAUSEA  . Hydrocodone Itching and Rash  . Other Nausea And Vomiting    STATES HE HAS NOT EATEN ANY FISH INCLUDING SHELLFISH FOR PAST 40 YRS - IT CAUSED NAUSEA  . Tylenol [Acetaminophen] Other (See Comments)    Liver problems     Family History  Problem Relation Age of Onset  . Cirrhosis Mother        died at 51  . Heart attack Father   . Other Brother        GSW  . Other Brother        died in house fire  . Cancer Brother        unsure of type Believes colon or prostate/fim  . Colon cancer Neg Hx   . Stomach cancer Neg Hx      Prior to Admission medications   Medication Sig Start Date End Date Taking? Authorizing Provider  aspirin 81 MG tablet Take 81 mg by mouth daily.   Yes [provider]  finasteride (PROSCAR) 5 MG tablet Take 5 mg by mouth daily.   Yes [provider]  fluticasone (FLONASE) 50 MCG/ACT nasal spray PLACE 1 SPRAY INTO BOTH NOSTRILS EVERY MORNING. Patient taking differently: Place 1 spray into both nostrils daily.  12/07/17  Yes [name], MD  furosemide (LASIX) 40 MG tablet TAKE 2 PILLS IN THE MORNING AND 1 PILL IN THE AFTERNOON. Patient taking differently: Take 80 mg by mouth daily.  02/15/18  Yes [name], MD  Guaifenesin 1200 MG TB12 Take 1 tablet (1,200 mg total) by mouth 2 (two) times daily. 02/15/18  Yes [name], MD  hydrALAZINE  (APRESOLINE) 10 MG tablet Take 1 tablet (10 mg total) by mouth 3 (three) times daily. 03/01/18  Yes Lelon Perla, MD  ibuprofen (ADVIL,MOTRIN) 200 MG tablet Take 400 mg by mouth every 6 (six) hours as needed for moderate pain.   Yes [provider]  Ibuprofen-diphenhydrAMINE Cit (ADVIL PM PO) Take 2 tablets by mouth at bedtime as needed (sleep).   Yes [provider]  KLOR-CON M20 20 MEQ tablet TAKE 2 TABLETS (40 MEQ TOTAL) BY MOUTH 2 (TWO) TIMES DAILY. Patient taking differently: Take 20 mEq by mouth daily.  04/29/18  Yes Lelon Perla, MD  lactulose (CHRONULAC) 10 GM/15ML solution TAKE 30  MLS BY MOUTH 2 TIMES DAILY AS NEEDED FOR MILD CONSTIPATION OR MODERATE CONSTIPATION Patient taking differently: Take 20 g by mouth 2 (two) times daily as needed for mild constipation.  06/01/18  Yes [name], MD  Naloxone HCl [facility] NA) Place 1 application into the nose once.   Yes [provider]  nystatin ointment (MYCOSTATIN) Apply 1 application topically 2 (two) times daily. 04/15/18  Yes [name], MD  oxyCODONE HCl 30 MG TABA Take 30 mg by mouth See admin instructions. Take 30 mg every 4 to 5 hours as needed for pain   Yes [provider]  pravastatin (PRAVACHOL) 20 MG tablet TAKE 1 TABLET BY MOUTH EVERY DAY Patient taking differently: Take 20 mg by mouth daily.  01/12/18  Yes [name], MD  sertraline (ZOLOFT) 25 MG tablet TAKE 1 TABLET BY MOUTH EVERYDAY AT BEDTIME Patient taking differently: Take 25 mg by mouth at bedtime as needed (sleep).  06/22/18  Yes [name], MD  spironolactone (ALDACTONE) 25 MG tablet TAKE 1 TABLET BY MOUTH EVERY DAY Patient taking differently: Take 25 mg by mouth daily.  04/08/18  Yes [name], MD  tamsulosin (FLOMAX) 0.4 MG CAPS capsule TAKE 1 CAPSULE BY MOUTH EVERY DAY Patient taking differently: Take 0.4 mg by mouth daily.  04/05/18  Yes [name], MD  VENTOLIN  HFA 108 (90 Base) MCG/ACT inhaler TAKE 2 PUFFS BY MOUTH EVERY 6 HOURS AS NEEDED FOR WHEEZE Patient taking differently: Inhale 2 puffs into the lungs every 6 (six) hours as needed for wheezing.  12/01/17  Yes [name], MD  hydrALAZINE (APRESOLINE) 10 MG tablet TAKE 1 TABLET BY MOUTH 3 TIMES A DAY Patient not taking: No sig reported 03/30/18   Lelon Perla, MD  pregabalin (LYRICA) 100 MG capsule Take 1 capsule (100 mg total) by mouth 2 (two) times daily. Patient not taking: Reported on 06/22/2018 03/29/18   Kathrynn Ducking, MD  tiZANidine (ZANAFLEX) 2 MG tablet TAKE 1 TABLET (2 MG TOTAL) BY MOUTH 3 (THREE) TIMES DAILY. Patient not taking: Reported on 06/22/2018 05/17/18   Kathrynn Ducking, MD    Physical Exam: Vitals:   06/22/18 1700 06/22/18 1750 06/22/18 1759 06/22/18 1900  BP: (!) 130/54  (!) 168/59 139/80  Pulse: (!) 52  (!) 42 (!) 50  Resp: 13  18 18   Temp:   97.8 F (36.6 C) 97.8 F (36.6 C)  TempSrc:   Oral Oral  SpO2: 93%  99% 98%  Weight:  74.8 kg    Height:  5\' 11"  (1.803 m)      General: Alert, Awake and Oriented to Time, Place and Person. Appear in mild distress, affect appropriate Eyes: PERRL, Conjunctiva normal ENT: Oral Mucosa clear moist. Neck: no JVD, no Abnormal Mass Or lumps Cardiovascular: S1 and S2 Present, no Murmur, Peripheral Pulses Present Respiratory: normal respiratory effort, Bilateral Air entry equal and Decreased, no use of accessory muscle, Clear to Auscultation, no Crackles, no wheezes Abdomen: Bowel Sound present, Soft and no tenderness, no hernia Skin: no redness, no Rash, no induration Extremities: bilateral  Pedal edema, no calf tenderness Neurologic: Grossly no focal neuro deficit. Bilaterally Equal motor strength  Labs on Admission:  CBC: Recent Labs  Lab 06/22/18 1241  WBC 7.2  NEUTROABS 5.7  HGB 10.2*  HCT 34.0*  MCV 87.6  PLT 371   Basic Metabolic Panel: Recent Labs  Lab 06/22/18 1241  NA 138  K 3.3*  CL 107   CO2 24  GLUCOSE 132*  BUN 34*  CREATININE 1.34*  CALCIUM 8.4*   GFR: Estimated Creatinine Clearance: 50.4 mL/min (A) (by C-G formula based on SCr of 1.34 mg/dL (H)). Liver Function Tests: No results for input(s): AST, ALT, ALKPHOS, BILITOT, PROT, ALBUMIN in the last 168 hours. No results for input(s): LIPASE, AMYLASE in the last 168 hours. No results for input(s): AMMONIA in the last 168 hours. Coagulation Profile: No results for input(s): INR, PROTIME in the last 168 hours. Cardiac Enzymes: No results for input(s): CKTOTAL, CKMB, CKMBINDEX, TROPONINI in the last 168 hours. BNP (last 3 results) No results for input(s): PROBNP in the last 8760 hours. HbA1C: No results for input(s): HGBA1C in the last 72 hours. CBG: No results for input(s): GLUCAP in the last 168 hours. Lipid Profile: No results for input(s): CHOL, HDL, LDLCALC, TRIG, CHOLHDL, LDLDIRECT in the last 72 hours. Thyroid Function Tests: No results for input(s): TSH, T4TOTAL, FREET4, T3FREE, THYROIDAB in the last 72 hours. Anemia Panel: No results for input(s): VITAMINB12, FOLATE, FERRITIN, TIBC, IRON, RETICCTPCT in the last 72 hours. Urine analysis:    Component Value Date/Time   COLORURINE YELLOW 01/21/2017 0450   APPEARANCEUR CLEAR 01/21/2017 0450   LABSPEC 1.011 01/21/2017 0450   PHURINE 5.0 01/21/2017 0450   GLUCOSEU NEGATIVE 01/21/2017 0450   HGBUR NEGATIVE 01/21/2017 0450   BILIRUBINUR 3+ 09/16/2017 1617   KETONESUR NEGATIVE 01/21/2017 0450   PROTEINUR Positive (A) 09/16/2017 1617   PROTEINUR NEGATIVE 01/21/2017 0450   UROBILINOGEN 2.0 (A) 09/16/2017 1617   UROBILINOGEN 1.0 07/28/2013 2005   NITRITE pos 09/16/2017 1617   NITRITE NEGATIVE 01/21/2017 0450   LEUKOCYTESUR Large (3+) (A) 09/16/2017 1617    Radiological Exams on Admission: Dg Pelvis 1-2 Views  Result Date: 06/22/2018 CLINICAL DATA:  Fall 1 week ago with hip dislocation EXAM: PELVIS - 1-2 VIEW COMPARISON:  None. FINDINGS: There has been  dislocation of the femoral prosthesis from the acetabulum. No definitive fracture is seen. Degenerative changes of lumbar spine are noted. No soft tissue abnormality is seen. IMPRESSION: Dislocation of the femoral prosthesis from the acetabulum. Electronically Signed   By: Inez Catalina M.D.   On: 06/22/2018 14:57   Chest Portable 1 View  Result Date: 06/22/2018 CLINICAL DATA:  Failed total hip arthroplasty with dislocation, initial encounter. Preop cardiovascular exam. EXAM: PORTABLE CHEST 1 VIEW COMPARISON:  Chest radiograph 01/26/2018 FINDINGS: Post median sternotomy. Unchanged heart size and mediastinal contours with borderline cardiomegaly. No focal airspace disease, pulmonary edema, pleural effusion or pneumothorax. No acute osseous abnormalities are seen. IMPRESSION: No acute chest findings. Electronically Signed   By: Keith Rake M.D.   On: 06/22/2018 19:13   Dg Hip Unilat W Or Wo Pelvis 1 View Left  Result Date: 06/22/2018 CLINICAL DATA:  Hip pain EXAM: DG HIP (WITH OR WITHOUT PELVIS) 1V*L* COMPARISON:  06/12/2018 FINDINGS: Dislocation of the left femoral prosthesis from the acetabulum with rotated appearance of the femur. No gross fracture allowing for positioning. IMPRESSION: Dislocation of the left femoral prosthesis with rotated appearance of the femur. Electronically Signed   By: Donavan Foil M.D.   On: 06/22/2018 01:49   Dg Hip Port Unilat W Or Wo Pelvis 1 View Left  Result Date: 06/22/2018 CLINICAL DATA:  Postreduction. EXAM: DG HIP (WITH OR WITHOUT PELVIS) 1V PORT LEFT COMPARISON:  LEFT hip radiograph June 22, 2018 at 0414 hours. FINDINGS: Status post LEFT hip hemiarthroplasty with intact well-seated hardware. No periprosthetic lucency. No fracture  deformity or dislocation by single frontal radiograph. Soft tissue planes are non suspicious. IMPRESSION: Status post LEFT hip hemiarthroplasty without fracture deformity or dislocation by single view. Electronically Signed   By: Elon Alas M.D.   On: 06/22/2018 15:52   Dg Hip Port Unilat W Or Wo Pelvis 1 View Left  Result Date: 06/22/2018 CLINICAL DATA:  Hip reduction EXAM: DG HIP (WITH OR WITHOUT PELVIS) 1V PORT LEFT COMPARISON:  Earlier today FINDINGS: A left hip hemiarthroplasty appears reduced. No evident fracture as permitted by limited projection. IMPRESSION: Left hip arthroplasty has been reduced in the frontal projection. Electronically Signed   By: Monte Fantasia M.D.   On: 06/22/2018 04:44   Assessment/Plan 1. Failed total hip arthroplasty with dislocation, initial encounter ([place]) Chronic pain syndrome. - Presents with left hip pain more recently underwent left hip total arthroplasty after a fracture. So far 3 ER visits with hip dislocation which were reduced in the ER. Orthopedic recommends admission for further work-up. We will continue his home pain medication. Cardiology consulted for possible preop clearance.  2.   Mobitz type I block. Per patient, cardiology informed him that patient may require pacemaker. Heart rate occasionally drops down to 30s. Patient remains asymptomatic. Avoid AV nodal blocking agent. Cardiology consult for further assistance.  3. COPD  - No SOB or wheezing  - Continue as-needed albuterol nebs   4. CAD  - Status-post CABG in 2010  - Denies any recent angina  - Continue ASA, continue statin    5. Cirrhosis  - Appears compensated  - Denies any EtOH in ~15 yrs  - Continue Lasix.  Continue lactulose.  No evidence of an Apathy.  6. Chronic diastolic CHF  - Will continue home regimen of Lasix and Aldactone.  7. AAA  - Asymptomatic and measurements remained stable on [place] from August    8. Hypertension  - Continue hydralazine as tolerated   9. CKD stage III  - SCr is 1.3 on admission, slightly better from recent. - He appears mildly volume overloaded.  We will continue with diuretics.   Nutrition: Cardiac and carb modified diet DVT Prophylaxis:  subcutaneous Heparin  Advance goals of care discussion: full code   Consults: orthopedics   Family Communication: no family was present at bedside, at the time of interview.  Disposition: Admitted as observation, telemetry unit. Likely to be discharged be determined,  Author: Berle Mull, MD [facility] 06/22/2018  To reach On-call, see care teams to locate the attending and reach out to them via [email or web address]. If 7PM-7AM, please contact night-coverage If you still have difficulty reaching the attending provider, please page the [facility] (Director on Call) for [facility] on amion for assistance.

## 2018-06-22 NOTE — Discharge Instructions (Signed)
Wear the immobilizer until you see your orthopedic doctor.

## 2018-06-22 NOTE — Plan of Care (Signed)
  Problem: Education: Goal: Knowledge of General Education information will improve Description: Including pain rating scale, medication(s)/side effects and non-pharmacologic comfort measures Outcome: Progressing   Problem: Health Behavior/Discharge Planning: Goal: Ability to manage health-related needs will improve Outcome: Progressing   Problem: Clinical Measurements: Goal: Ability to maintain clinical measurements within normal limits will improve Outcome: Progressing Goal: Will remain free from infection Outcome: Progressing Goal: Diagnostic test results will improve Outcome: Progressing Goal: Respiratory complications will improve Outcome: Progressing Goal: Cardiovascular complication will be avoided Outcome: Progressing   Problem: Nutrition: Goal: Adequate nutrition will be maintained Outcome: Progressing   Problem: Coping: Goal: Level of anxiety will decrease Outcome: Progressing   Problem: Elimination: Goal: Will not experience complications related to urinary retention Outcome: Progressing   Problem: Pain Managment: Goal: General experience of comfort will improve Outcome: Progressing   Problem: Safety: Goal: Ability to remain free from injury will improve Outcome: Progressing   Problem: Skin Integrity: Goal: Risk for impaired skin integrity will decrease Outcome: Progressing   

## 2018-06-22 NOTE — ED Triage Notes (Signed)
Pt had L hip replacement 4 months ago. Was seen here 10 days ago for a fall and dislocation where it was relocated and sent home with walker. Pt reports moving funny in a chair today and pain got very bad and her could no longer move his leg. 17mcg fentanyl given PTA

## 2018-06-22 NOTE — ED Provider Notes (Signed)
[facility] DEPT Provider Note   CSN: [id number] Arrival date & time: 06/22/18  0042    History   Chief Complaint Chief Complaint  Patient presents with  . Leg Pain    HPI [name] is a 78 y.o. male.   The history is provided by the patient.  He has history of hypertension, hyperlipidemia, coronary artery disease, abdominal aortic aneurysm, heart failure and comes in following spontaneous dislocation of his left hip.  He had surgery for left hip fracture 5 months ago and was doing well until about 1 week ago when he fell and dislocated the hip.  It was successfully reduced in the ED, and he is scheduled to see his orthopedic surgeon in 2 days.  Today, he was sitting at his desk when the hip spontaneously dislocated.  He is complaining of severe pain in the hip.  In the ambulance coming to the ED, he had been given fentanyl 50 mg without significant pain relief.  Past Medical History:  Diagnosis Date  . AAA (abdominal aortic aneurysm) ([place])   . Blindness of left eye   . 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 CAD (coronary artery disease)    a. s/p CABG in 10/2008 with LIMA-LAD, SVG-OM1 with Y-graft to PL branch, and SVG-RCA  . Carotid stenosis   . Cerebrovascular disease   . CHF (congestive heart failure) ([place])   . Cirrhosis ([place])    on CT a/p 07/2013, no longer drinking  . CONGENITAL UNSPEC REDUCTION DEFORMITY LOWER LIMB   . Constipation due to opioid therapy 2014   began about a year ago  . COPD (chronic obstructive pulmonary disease) ([place])   . Foley catheter in place   . HTN (hypertension)   . Hyperlipidemia   . 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 Neuromuscular disorder ([place])    PT STATES HE HAS BEEN TOLD HE EITHER HAD POLIO OR CEREBRAL PALSY - STATES HIS LEFT LEG IS SHORTER AND AFFECTS HIS BALANCE - HE WEARS ELEVATED SHOE  -LEFT  . Pain    BACK, LEGS AND ARMS  . Shortness of breath    EXERTION  . TOBACCO USE, QUIT     Patient Active Problem List   Diagnosis Date Noted  . Closed left hip fracture, initial encounter ([place]) 01/26/2018  . Neck pain 12/18/2017  . Right hip pain 06/22/2017  . Chronic diastolic heart failure ([place]) 03/16/2017  . CKD (chronic kidney disease) stage 3, GFR 30-59 ml/min ([facility]) 03/16/2017  . Diastolic dysfunction 61/95/0932  . Opiate dependence ([place]) 02/13/2017  . Uremia 01/21/2017  . Acute renal failure superimposed on stage 3 chronic kidney disease ([place]) 01/21/2017  . Atrial flutter ([place]) 11/24/2014  . Aortic stenosis 03/24/2014  . Second degree AV block, Mobitz type I 09/06/2013  . Cirrhosis ([place]) 07/28/2013  . Bladder outlet obstruction 07/28/2013  . Impaired glucose metabolism   . Chronic back pain   . Benign neoplasm of colon 01/08/2011  . Abdominal aortic aneurysm ([place]) 08/12/2010  . TOBACCO USE, QUIT 04/11/2009  . Occlusion and stenosis of carotid artery 01/08/2009  . Hyperlipidemia 11/13/2008  . ANEMIA 11/13/2008  . Essential hypertension 11/13/2008  . Coronary atherosclerosis 11/13/2008  . COPD (chronic obstructive pulmonary disease) ([place]) 11/13/2008    Past Surgical History:  Procedure Laterality Date  . CORONARY ARTERY BYPASS GRAFT  11/03/2008   Ricard Dillon - x4: left internal  mammary artery to the distal left anterior descending, saphemous vein graft to the first circumflex marginal branch with a Y graft sequentiallly to a left posterolateral branch, saphenous vein graft to the distal right coronary artery  . ELECTROPHYSIOLOGIC STUDY N/A 01/18/2015   Procedure: A-Flutter Ablation;  Surgeon: Deboraha Sprang, MD;  Location: [facility];  Service: Cardiovascular;  Laterality: N/A;  . GREEN LIGHT LASER TURP (TRANSURETHRAL RESECTION OF PROSTATE N/A 02/14/2014   Procedure: GREEN LIGHT LASER TURP (TRANSURETHRAL RESECTION OF PROSTATE;  Surgeon: Festus Aloe, MD;   Location: [facility];  Service: Urology;  Laterality: N/A;  . HIP ARTHROPLASTY Left 01/26/2018   Procedure: LEFT HIP POSTERIOR HEMIARTHROPLASTY;  Surgeon: [name], MD;  Location: [facility];  Service: Orthopedics;  Laterality: Left;        Home Medications    Prior to Admission medications   Medication Sig Start Date End Date Taking? Authorizing Provider  aspirin 81 MG tablet Take 81 mg by mouth daily.    [provider]  finasteride (PROSCAR) 5 MG tablet Take 5 mg by mouth daily.    [provider]  fluticasone (FLONASE) 50 MCG/ACT nasal spray PLACE 1 SPRAY INTO BOTH NOSTRILS EVERY MORNING. Patient taking differently: Place 1 spray into both nostrils daily.  12/07/17   [name], MD  furosemide (LASIX) 40 MG tablet TAKE 2 PILLS IN THE MORNING AND 1 PILL IN THE AFTERNOON. Patient taking differently: Take 40-80 mg by mouth 2 (two) times daily. Take 80mg  in the am and 40mg  in the pm 02/15/18   [name], MD  Guaifenesin 1200 MG TB12 Take 1 tablet (1,200 mg total) by mouth 2 (two) times daily. 02/15/18   [name], MD  hydrALAZINE (APRESOLINE) 10 MG tablet Take 1 tablet (10 mg total) by mouth 3 (three) times daily. 03/01/18   Lelon Perla, MD  hydrALAZINE (APRESOLINE) 10 MG tablet TAKE 1 TABLET BY MOUTH 3 TIMES A DAY Patient taking differently: Take 10 mg by mouth 3 (three) times daily.  03/30/18   Lelon Perla, MD  ibuprofen (ADVIL,MOTRIN) 200 MG tablet Take 400 mg by mouth every 6 (six) hours as needed for moderate pain.    [provider]  KLOR-CON M20 20 MEQ tablet TAKE 2 TABLETS (40 MEQ TOTAL) BY MOUTH 2 (TWO) TIMES DAILY. Patient taking differently: Take 40 mEq by mouth 2 (two) times daily.  04/29/18   Lelon Perla, MD  lactulose (CHRONULAC) 10 GM/15ML solution TAKE 30 MLS BY MOUTH 2 TIMES DAILY AS NEEDED FOR MILD CONSTIPATION OR MODERATE CONSTIPATION Patient taking differently: Take 20 g by mouth 2 (two) times daily  as needed for mild constipation.  06/01/18   [name], MD  nystatin ointment (MYCOSTATIN) Apply 1 application topically 2 (two) times daily. 04/15/18   [name], MD  oxyCODONE HCl 30 MG TABA Take 30 mg by mouth every 4 (four) hours.    [provider]  pravastatin (PRAVACHOL) 20 MG tablet TAKE 1 TABLET BY MOUTH EVERY DAY Patient taking differently: Take 20 mg by mouth daily.  01/12/18   [name], MD  pregabalin (LYRICA) 100 MG capsule Take 1 capsule (100 mg total) by mouth 2 (two) times daily. 03/29/18   Kathrynn Ducking, MD  sertraline (ZOLOFT) 25 MG tablet TAKE 1 TABLET BY MOUTH EVERYDAY AT BEDTIME Patient taking differently: Take 25 mg by mouth daily as needed. Helps with sleep 06/05/17   [name], MD  spironolactone (ALDACTONE) 25 MG tablet TAKE 1 TABLET BY MOUTH EVERY DAY Patient taking differently: Take 25 mg by mouth daily.  04/08/18   [name], MD  tamsulosin (FLOMAX) 0.4 MG CAPS capsule TAKE 1 CAPSULE BY MOUTH EVERY DAY Patient taking differently: Take 0.4 mg by mouth daily.  04/05/18   [name], MD  tiZANidine (ZANAFLEX) 2 MG tablet TAKE 1 TABLET (2 MG TOTAL) BY MOUTH 3 (THREE) TIMES DAILY. 05/17/18   Kathrynn Ducking, MD  VENTOLIN HFA 108 (90 Base) MCG/ACT inhaler TAKE 2 PUFFS BY MOUTH EVERY 6 HOURS AS NEEDED FOR WHEEZE Patient taking differently: Inhale 2 puffs into the lungs every 6 (six) hours as needed for wheezing.  12/01/17   [name], MD    Family History Family History  Problem Relation Age of Onset  . Cirrhosis Mother        died at 68  . Heart attack Father   . Other Brother        GSW  . Other Brother        died in house fire  . Cancer Brother        unsure of type Believes colon or prostate/fim  . Colon cancer Neg Hx   . Stomach cancer Neg Hx     Social History Social History   Tobacco Use  . Smoking status: Former Research scientist (life sciences)  . Smokeless tobacco: Never Used   Substance Use Topics  . Alcohol use: No    Alcohol/week: 0.0 standard drinks    Comment: History of heavy alcohol use per pt. Quit many years ago1/1/ 2005  . Drug use: No     Allergies   Doxycycline; Amlodipine; Fish allergy; Hydrocodone; Other; and Tylenol [acetaminophen]   Review of Systems Review of Systems  All other systems reviewed and are negative.    Physical Exam Updated Vital Signs BP (!) 145/69   Pulse 84   Temp 97.6 F (36.4 C) (Oral)   Resp 12   Ht 5\' 11"  (1.803 m)   Wt 74.8 kg   SpO2 100%   BMI 23.01 kg/m   Physical Exam Vitals signs and nursing note reviewed.    78 year old male, resting comfortably and in no acute distress. Vital signs are significant for elevated systolic blood pressure. Oxygen saturation is 100%, which is normal. Head is normocephalic and atraumatic. PERRLA, EOMI. Oropharynx is clear. Neck is nontender and supple without adenopathy or JVD. Back is nontender and there is no CVA tenderness. Lungs are clear without rales, wheezes, or rhonchi. Chest is nontender. Heart has regular rate and rhythm without murmur. Abdomen is soft, flat, nontender without masses or hepatosplenomegaly and peristalsis is normoactive. Extremities: Left leg is shortened and internally rotated.  There is pain with any movement.  Distal neurovascular exam is intact.  There is 1-2+ pitting edema bilaterally.  Remainder of extremity exam is unremarkable. Skin is warm and dry without rash. Neurologic: Mental status is normal, cranial nerves are intact, there are no motor or sensory deficits.  ED Treatments / Results   Radiology Dg Hip Unilat W Or Wo Pelvis 1 View Left  Result Date: 06/22/2018 CLINICAL DATA:  Hip pain EXAM: DG HIP (WITH OR WITHOUT PELVIS) 1V*L* COMPARISON:  06/12/2018 FINDINGS: Dislocation of the left femoral prosthesis from the acetabulum with rotated appearance of the femur. No gross fracture allowing for positioning. IMPRESSION: Dislocation of  the left femoral prosthesis with rotated appearance of the femur. Electronically Signed   By:  Donavan Foil M.D.   On: 06/22/2018 01:49    Procedures .Sedation Date/Time: 06/22/2018 4:12 AM Performed by: Delora Fuel, MD Authorized by: Delora Fuel, MD   Consent:    Consent obtained:  Verbal   Consent given by:  Patient   Risks discussed:  Allergic reaction, dysrhythmia, inadequate sedation, nausea, prolonged hypoxia resulting in organ damage, prolonged sedation necessitating reversal, respiratory compromise necessitating ventilatory assistance and intubation and vomiting   Alternatives discussed:  Analgesia without sedation, anxiolysis and regional anesthesia Universal protocol:    Procedure explained and questions answered to patient or proxy's satisfaction: yes     Relevant documents present and verified: yes     Test results available and properly labeled: yes     Imaging studies available: yes     Required blood products, implants, devices, and special equipment available: yes     Site/side marked: yes     Immediately prior to procedure a time out was called: yes     Patient identity confirmation method:  Verbally with patient Indications:    Procedure necessitating sedation performed by:  Physician performing sedation Pre-sedation assessment:    ASA classification: class 2 - patient with mild systemic disease     Neck mobility: normal     Mouth opening:  3 or more finger widths   Thyromental distance:  4 finger widths   Mallampati score:  I - soft palate, uvula, fauces, pillars visible   Pre-sedation assessments completed and reviewed: airway patency, cardiovascular function, hydration status, mental status, nausea/vomiting, pain level, respiratory function and temperature     Pre-sedation assessment completed:  06/22/2018 3:55 AM Immediate pre-procedure details:    Reassessment: Patient reassessed immediately prior to procedure     Reviewed: vital signs, relevant labs/tests and  NPO status     Verified: bag valve mask available, emergency equipment available, intubation equipment available, IV patency confirmed, oxygen available and suction available   Procedure details (see MAR for exact dosages):    Preoxygenation:  Nasal cannula   Sedation:  Propofol   Intra-procedure monitoring:  Blood pressure monitoring, cardiac monitor, continuous pulse oximetry, frequent LOC assessments, frequent vital sign checks and continuous capnometry   Intra-procedure events: none     Total Provider sedation time (minutes):  30 Post-procedure details:    Post-sedation assessment completed:  06/22/2018 4:25 AM   Attendance: Constant attendance by certified staff until patient recovered     Recovery: Patient returned to pre-procedure baseline     Post-sedation assessments completed and reviewed: airway patency, cardiovascular function, hydration status, mental status, nausea/vomiting, pain level, respiratory function and temperature     Patient is stable for discharge or admission: yes     Patient tolerance:  Tolerated well, no immediate complications Reduction of dislocation Date/Time: 06/22/2018 3:55 AM Performed by: Delora Fuel, MD Authorized by: Delora Fuel, MD  Consent: Verbal consent obtained. Written consent obtained. Risks and benefits: risks, benefits and alternatives were discussed Consent given by: patient Patient understanding: patient states understanding of the procedure being performed Patient consent: the patient's understanding of the procedure matches consent given Procedure consent: procedure consent matches procedure scheduled Relevant documents: relevant documents present and verified Test results: test results available and properly labeled Site marked: the operative site was marked Imaging studies: imaging studies available Required items: required blood products, implants, devices, and special equipment available Patient identity confirmed: verbally with  patient and arm band Time out: Immediately prior to procedure a "time out" was called to verify the correct patient,  procedure, equipment, support staff and site/side marked as required. Local anesthesia used: no  Anesthesia: Local anesthesia used: no  Sedation: Patient sedated: yes Sedatives: propofol Sedation start date/time: 06/22/2018 3:55 AM Sedation end date/time: 06/22/2018 4:25 AM Vitals: Vital signs were monitored during sedation.  Patient tolerance: Patient tolerated the procedure well with no immediate complications Comments: Left hip dislocation reduced without difficulty.  Postprocedure x-ray has been ordered.      Medications Ordered in ED Medications  propofol (DIPRIVAN) 10 mg/mL bolus/IV push 37.4 mg (has no administration in time range)  fentaNYL (SUBLIMAZE) injection 100 mcg (100 mcg Intravenous Given 06/22/18 0301)     Initial Impression / Assessment and Plan / ED Course  I have reviewed the triage vital signs and the nursing notes.  Pertinent imaging results that were available during my care of the patient were reviewed by me and considered in my medical decision making (see chart for details).  Dislocation of left hip.  X-rays show he had a hemiarthroplasty and the prosthetic ball is no longer in the acetabulum.  Old records are reviewed confirming hospitalization for hip fracture and treated with hemiarthroplasty, also ED visit for hip dislocation following fall.  Hip was reduced with relatively little effort.  I am concerned that the joint is still somewhat unstable.  Postreduction x-ray confirms adequate reduction.  He has an appointment with his orthopedic physician in 2 days, and he is to keep that appointment.  Final Clinical Impressions(s) / ED Diagnoses   Final diagnoses:  Closed dislocation of left hip, initial encounter [facility])    ED Discharge Orders    None       Delora Fuel, MD 18/59/09 [phone]

## 2018-06-22 NOTE — ED Notes (Signed)
MD attempted to pull hip back in place with ortho tech at bedside

## 2018-06-22 NOTE — ED Notes (Signed)
Notified provider about reported pain from pt.

## 2018-06-22 NOTE — ED Provider Notes (Addendum)
[facility] DEPT Provider Note   CSN: [id number] Arrival date & time: 06/22/18  1215    History   Chief Complaint Chief Complaint  Patient presents with  . Hip Pain    HPI [name] is a 78 y.o. male.     78 year old male with prior medical history as detailed below presents for evaluation of left hip pain.  Patient was just discharged earlier this morning after reduction of dislocated left hip.  Patient reports he went home and sat down on his desk and immediately his hip re-dislocated.  He arrives now by EMS he complains of pain to the left hip.  He denies any other injury or complaint.  His orthopedic doctor is Dr. Alvan Dame.  This is his third dislocation of the same hip.  The history is provided by the patient and medical records.  Fall  This is a new problem. The current episode started 1 to 2 hours ago. The problem occurs every several days. The problem has not changed since onset.Pertinent negatives include no chest pain, no abdominal pain, no headaches and no shortness of breath. Nothing aggravates the symptoms. Nothing relieves the symptoms.    Past Medical History:  Diagnosis Date  . AAA (abdominal aortic aneurysm) ([place])   . Blindness of left eye   . 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 CAD (coronary artery disease)    a. s/p CABG in 10/2008 with LIMA-LAD, SVG-OM1 with Y-graft to PL branch, and SVG-RCA  . Carotid stenosis   . Cerebrovascular disease   . CHF (congestive heart failure) ([place])   . Cirrhosis ([place])    on CT a/p 07/2013, no longer drinking  . CONGENITAL UNSPEC REDUCTION DEFORMITY LOWER LIMB   . Constipation due to opioid therapy 2014   began about a year ago  . COPD (chronic obstructive pulmonary disease) ([place])   . Foley catheter in place   . HTN (hypertension)   . Hyperlipidemia   . 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 Neuromuscular disorder ([place])    PT STATES HE HAS BEEN TOLD HE EITHER HAD POLIO OR CEREBRAL PALSY - STATES HIS LEFT LEG IS SHORTER AND AFFECTS HIS BALANCE - HE WEARS ELEVATED SHOE -LEFT  . Pain    BACK, LEGS AND ARMS  . Shortness of breath    EXERTION  . TOBACCO USE, QUIT     Patient Active Problem List   Diagnosis Date Noted  . Closed left hip fracture, initial encounter ([place]) 01/26/2018  . Neck pain 12/18/2017  . Right hip pain 06/22/2017  . Chronic diastolic heart failure ([place]) 03/16/2017  . CKD (chronic kidney disease) stage 3, GFR 30-59 ml/min ([facility]) 03/16/2017  . Diastolic dysfunction 76/16/0737  . Opiate dependence ([place]) 02/13/2017  . Uremia 01/21/2017  . Acute renal failure superimposed on stage 3 chronic kidney disease ([place]) 01/21/2017  . Atrial flutter ([place]) 11/24/2014  . Aortic stenosis 03/24/2014  . Second degree AV block, Mobitz type I 09/06/2013  . Cirrhosis ([place]) 07/28/2013  . Bladder outlet obstruction 07/28/2013  . Impaired glucose metabolism   . Chronic back pain   . Benign neoplasm of colon 01/08/2011  . Abdominal aortic aneurysm ([place]) 08/12/2010  . TOBACCO USE, QUIT 04/11/2009  . Occlusion and stenosis of carotid artery 01/08/2009  . Hyperlipidemia 11/13/2008  . ANEMIA 11/13/2008  . Essential hypertension 11/13/2008  . Coronary atherosclerosis 11/13/2008  .  COPD (chronic obstructive pulmonary disease) ([place]) 11/13/2008    Past Surgical History:  Procedure Laterality Date  . CORONARY ARTERY BYPASS GRAFT  11/03/2008   Ricard Dillon - x4: left internal mammary artery to the distal left anterior descending, saphemous vein graft to the first circumflex marginal branch with a Y graft sequentiallly to a left posterolateral branch, saphenous vein graft to the distal right coronary artery  . ELECTROPHYSIOLOGIC STUDY N/A 01/18/2015   Procedure: A-Flutter Ablation;  Surgeon: Deboraha Sprang, MD;  Location: [facility];  Service: Cardiovascular;   Laterality: N/A;  . GREEN LIGHT LASER TURP (TRANSURETHRAL RESECTION OF PROSTATE N/A 02/14/2014   Procedure: GREEN LIGHT LASER TURP (TRANSURETHRAL RESECTION OF PROSTATE;  Surgeon: Festus Aloe, MD;  Location: [facility];  Service: Urology;  Laterality: N/A;  . HIP ARTHROPLASTY Left 01/26/2018   Procedure: LEFT HIP POSTERIOR HEMIARTHROPLASTY;  Surgeon: [name], MD;  Location: [facility];  Service: Orthopedics;  Laterality: Left;        Home Medications    Prior to Admission medications   Medication Sig Start Date End Date Taking? Authorizing Provider  aspirin 81 MG tablet Take 81 mg by mouth daily.   Yes [provider]  finasteride (PROSCAR) 5 MG tablet Take 5 mg by mouth daily.   Yes [provider]  fluticasone (FLONASE) 50 MCG/ACT nasal spray PLACE 1 SPRAY INTO BOTH NOSTRILS EVERY MORNING. Patient taking differently: Place 1 spray into both nostrils daily.  12/07/17  Yes [name], MD  furosemide (LASIX) 40 MG tablet TAKE 2 PILLS IN THE MORNING AND 1 PILL IN THE AFTERNOON. Patient taking differently: Take 80 mg by mouth daily.  02/15/18  Yes [name], MD  Guaifenesin 1200 MG TB12 Take 1 tablet (1,200 mg total) by mouth 2 (two) times daily. 02/15/18  Yes [name], MD  hydrALAZINE (APRESOLINE) 10 MG tablet Take 1 tablet (10 mg total) by mouth 3 (three) times daily. 03/01/18  Yes Lelon Perla, MD  ibuprofen (ADVIL,MOTRIN) 200 MG tablet Take 400 mg by mouth every 6 (six) hours as needed for moderate pain.   Yes [provider]  Ibuprofen-diphenhydrAMINE Cit (ADVIL PM PO) Take 2 tablets by mouth at bedtime as needed (sleep).   Yes [provider]  KLOR-CON M20 20 MEQ tablet TAKE 2 TABLETS (40 MEQ TOTAL) BY MOUTH 2 (TWO) TIMES DAILY. Patient taking differently: Take 20 mEq by mouth daily.  04/29/18  Yes Lelon Perla, MD  lactulose (CHRONULAC) 10 GM/15ML solution TAKE 30 MLS BY MOUTH 2 TIMES DAILY AS NEEDED FOR  MILD CONSTIPATION OR MODERATE CONSTIPATION Patient taking differently: Take 20 g by mouth 2 (two) times daily as needed for mild constipation.  06/01/18  Yes [name], MD  Naloxone HCl [facility] NA) Place 1 application into the nose once.   Yes [provider]  nystatin ointment (MYCOSTATIN) Apply 1 application topically 2 (two) times daily. 04/15/18  Yes [name], MD  oxyCODONE HCl 30 MG TABA Take 30 mg by mouth See admin instructions. Take 30 mg every 4 to 5 hours as needed for pain   Yes [provider]  pravastatin (PRAVACHOL) 20 MG tablet TAKE 1 TABLET BY MOUTH EVERY DAY Patient taking differently: Take 20 mg by mouth daily.  01/12/18  Yes [name], MD  sertraline (ZOLOFT) 25 MG tablet TAKE 1 TABLET BY MOUTH EVERYDAY AT BEDTIME Patient taking differently: Take 25 mg by mouth at bedtime as needed (sleep).  06/22/18  Yes [name], MD  spironolactone (ALDACTONE) 25 MG tablet TAKE 1 TABLET BY MOUTH EVERY DAY Patient taking differently: Take 25 mg by mouth daily.  04/08/18  Yes [name], MD  tamsulosin (FLOMAX) 0.4 MG CAPS capsule TAKE 1 CAPSULE BY MOUTH EVERY DAY Patient taking differently: Take 0.4 mg by mouth daily.  04/05/18  Yes [name], MD  VENTOLIN HFA 108 (90 Base) MCG/ACT inhaler TAKE 2 PUFFS BY MOUTH EVERY 6 HOURS AS NEEDED FOR WHEEZE Patient taking differently: Inhale 2 puffs into the lungs every 6 (six) hours as needed for wheezing.  12/01/17  Yes [name], MD  hydrALAZINE (APRESOLINE) 10 MG tablet TAKE 1 TABLET BY MOUTH 3 TIMES A DAY Patient not taking: No sig reported 03/30/18   Lelon Perla, MD  pregabalin (LYRICA) 100 MG capsule Take 1 capsule (100 mg total) by mouth 2 (two) times daily. Patient not taking: Reported on 06/22/2018 03/29/18   Kathrynn Ducking, MD  tiZANidine (ZANAFLEX) 2 MG tablet TAKE 1 TABLET (2 MG TOTAL) BY MOUTH 3 (THREE) TIMES DAILY. Patient not taking:  Reported on 06/22/2018 05/17/18   Kathrynn Ducking, MD    Family History Family History  Problem Relation Age of Onset  . Cirrhosis Mother        died at 69  . Heart attack Father   . Other Brother        GSW  . Other Brother        died in house fire  . Cancer Brother        unsure of type Believes colon or prostate/fim  . Colon cancer Neg Hx   . Stomach cancer Neg Hx     Social History Social History   Tobacco Use  . Smoking status: Former Research scientist (life sciences)  . Smokeless tobacco: Never Used  Substance Use Topics  . Alcohol use: No    Alcohol/week: 0.0 standard drinks    Comment: History of heavy alcohol use per pt. Quit many years ago1/1/ 2005  . Drug use: No     Allergies   Doxycycline; Amlodipine; Fish allergy; Hydrocodone; Other; and Tylenol [acetaminophen]   Review of Systems Review of Systems  Respiratory: Negative for shortness of breath.   Cardiovascular: Negative for chest pain.  Gastrointestinal: Negative for abdominal pain.  Neurological: Negative for headaches.  All other systems reviewed and are negative.    Physical Exam Updated Vital Signs BP (!) 158/68   Pulse (!) 57   Temp 97.8 F (36.6 C) (Oral)   Resp 14   SpO2 100%   Physical Exam Vitals signs and nursing note reviewed.  Constitutional:      General: He is not in acute distress.    Appearance: Normal appearance. He is well-developed.  HENT:     Head: Normocephalic and atraumatic.  Eyes:     Conjunctiva/sclera: Conjunctivae normal.     Pupils: Pupils are equal, round, and reactive to light.  Neck:     Musculoskeletal: Normal range of motion and neck supple.  Cardiovascular:     Rate and Rhythm: Normal rate and regular rhythm.     Heart sounds: Normal heart sounds.  Pulmonary:     Effort: Pulmonary effort is normal. No respiratory distress.     Breath sounds: Normal breath sounds.  Abdominal:     General: There is no distension.     Palpations: Abdomen is soft.     Tenderness: There is  no abdominal tenderness.  Musculoskeletal: Normal range of motion.        General: Tenderness present. No deformity.     Comments: Tenderness to the left hip with palpation left hip is held in a flexed position.  He is unable to extend his left hip.  Skin:    General: Skin is warm and dry.  Neurological:     General: No focal deficit present.     Mental Status: He is alert and oriented to person, place, and time.      ED Treatments / Results  Labs (all labs ordered are listed, but only abnormal results are displayed) Labs Reviewed  BASIC METABOLIC PANEL - Abnormal; Notable for the following components:      Result Value   Potassium 3.3 (*)    Glucose, Bld 132 (*)    BUN 34 (*)    Creatinine, Ser 1.34 (*)    Calcium 8.4 (*)    GFR calc non Af Amer 51 (*)    GFR calc Af Amer 60 (*)    All other components within normal limits  CBC WITH DIFFERENTIAL/PLATELET - Abnormal; Notable for the following components:   RBC 3.88 (*)    Hemoglobin 10.2 (*)    HCT 34.0 (*)    RDW 16.6 (*)    All other components within normal limits    EKG None  Radiology Dg Pelvis 1-2 Views  Result Date: 06/22/2018 CLINICAL DATA:  Fall 1 week ago with hip dislocation EXAM: PELVIS - 1-2 VIEW COMPARISON:  None. FINDINGS: There has been dislocation of the femoral prosthesis from the acetabulum. No definitive fracture is seen. Degenerative changes of lumbar spine are noted. No soft tissue abnormality is seen. IMPRESSION: Dislocation of the femoral prosthesis from the acetabulum. Electronically Signed   By: Inez Catalina M.D.   On: 06/22/2018 14:57   Dg Hip Unilat W Or Wo Pelvis 1 View Left  Result Date: 06/22/2018 CLINICAL DATA:  Hip pain EXAM: DG HIP (WITH OR WITHOUT PELVIS) 1V*L* COMPARISON:  06/12/2018 FINDINGS: Dislocation of the left femoral prosthesis from the acetabulum with rotated appearance of the femur. No gross fracture allowing for positioning. IMPRESSION: Dislocation of the left femoral  prosthesis with rotated appearance of the femur. Electronically Signed   By: Donavan Foil M.D.   On: 06/22/2018 01:49   Dg Hip Port Unilat W Or Wo Pelvis 1 View Left  Result Date: 06/22/2018 CLINICAL DATA:  Hip reduction EXAM: DG HIP (WITH OR WITHOUT PELVIS) 1V PORT LEFT COMPARISON:  Earlier today FINDINGS: A left hip hemiarthroplasty appears reduced. No evident fracture as permitted by limited projection. IMPRESSION: Left hip arthroplasty has been reduced in the frontal projection. Electronically Signed   By: Monte Fantasia M.D.   On: 06/22/2018 04:44    Procedures .Sedation Date/Time: 06/22/2018 3:51 PM Performed by: Valarie Merino, MD Authorized by: Valarie Merino, MD   Consent:    Consent obtained:  Verbal and written   Consent given by:  Patient   Risks discussed:  Allergic reaction, dysrhythmia, inadequate sedation, nausea, prolonged hypoxia resulting in organ damage, prolonged sedation necessitating reversal, respiratory compromise necessitating ventilatory assistance and intubation and vomiting   Alternatives discussed:  Analgesia without sedation, anxiolysis and regional anesthesia Universal protocol:    Procedure explained and questions answered to patient or proxy's satisfaction: yes     Relevant documents present and verified: yes     Test results available and properly labeled: yes     Imaging studies available: yes  Required blood products, implants, devices, and special equipment available: yes     Site/side marked: yes     Immediately prior to procedure a time out was called: yes     Patient identity confirmation method:  Verbally with patient Indications:    Procedure necessitating sedation performed by:  Physician performing sedation Pre-sedation assessment:    Time since last food or drink:  5   ASA classification: class 2 - patient with mild systemic disease     Neck mobility: normal     Mouth opening:  3 or more finger widths   Thyromental distance:  4  finger widths   Mallampati score:  I - soft palate, uvula, fauces, pillars visible   Pre-sedation assessments completed and reviewed: airway patency, cardiovascular function, hydration status, mental status, nausea/vomiting, pain level, respiratory function and temperature   Immediate pre-procedure details:    Reassessment: Patient reassessed immediately prior to procedure     Reviewed: vital signs, relevant labs/tests and NPO status     Verified: bag valve mask available, emergency equipment available, intubation equipment available, IV patency confirmed, oxygen available and suction available   Procedure details (see MAR for exact dosages):    Preoxygenation:  Nasal cannula   Sedation:  Propofol   Intra-procedure monitoring:  Blood pressure monitoring, cardiac monitor, continuous pulse oximetry, frequent LOC assessments, frequent vital sign checks and continuous capnometry   Intra-procedure events: none     Total Provider sedation time (minutes):  30 Post-procedure details:    Attendance: Constant attendance by certified staff until patient recovered     Recovery: Patient returned to pre-procedure baseline     Post-sedation assessments completed and reviewed: airway patency, cardiovascular function, hydration status, mental status, nausea/vomiting, pain level, respiratory function and temperature     Patient is stable for discharge or admission: yes     Patient tolerance:  Tolerated well, no immediate complications Reduction of dislocation Date/Time: 06/22/2018 3:52 PM Performed by: Valarie Merino, MD Authorized by: Valarie Merino, MD  Consent: Verbal consent obtained. Written consent obtained. Consent given by: patient Patient understanding: patient states understanding of the procedure being performed Patient consent: the patient's understanding of the procedure matches consent given Procedure consent: procedure consent matches procedure scheduled Relevant documents: relevant  documents present and verified Test results: test results available and properly labeled Site marked: the operative site was marked Required items: required blood products, implants, devices, and special equipment available Patient identity confirmed: verbally with patient and arm band Time out: Immediately prior to procedure a "time out" was called to verify the correct patient, procedure, equipment, support staff and site/side marked as required. Preparation: Patient was prepped and draped in the usual sterile fashion. Local anesthesia used: no  Anesthesia: Local anesthesia used: no  Sedation: Patient sedated: yes Sedation type: moderate (conscious) sedation Sedatives: propofol Vitals: Vital signs were monitored during sedation.  Patient tolerance: Patient tolerated the procedure well with no immediate complications    (including critical care time)  Medications Ordered in ED Medications  morphine 4 MG/ML injection 4 mg (4 mg Intravenous Given 06/22/18 1247)  morphine 4 MG/ML injection 4 mg (4 mg Intravenous Given 06/22/18 1409)  propofol (DIPRIVAN) 10 mg/mL bolus/IV push 37.4 mg (37.4 mg Intravenous Given by Other 06/22/18 1539)  propofol (DIPRIVAN) 10 mg/mL bolus/IV push (40 mg Intravenous Given 06/22/18 1522)     Initial Impression / Assessment and Plan / ED Course  I have reviewed the triage vital signs and the nursing notes.  Pertinent labs & imaging results that were available during my care of the patient were reviewed by me and considered in my medical decision making (see chart for details).        MDM  Screen complete  Patient is presenting for evaluation of recurrent left hip dislocation.  Today's dislocation is his third dislocation of his hip.  It is his second dislocation in 24 hours.  Case discussed with Dr. Alvan Dame of Orthopedics.  He requested that the patient be admitted to [facility] long and they will see him in consultation.  Hospitalist service Posey Pronto) is  aware of case and will evaluate for admission.  Final Clinical Impressions(s) / ED Diagnoses   Final diagnoses:  Dislocation of left hip, initial encounter [facility])    ED Discharge Orders    None       Valarie Merino, MD 06/22/18 1548    Valarie Merino, MD 06/22/18 [phone]

## 2018-06-22 NOTE — ED Triage Notes (Signed)
Patient BIB [facility] from home for left hip pain. Pt had previous fall and hip sx in October. Last Saturday, had a fall and since then hip has become dislocated x4. Pt was here last night for same, discharged this am. Hip dislocated again around 1 hr ago. Pt uncomfortable to lie flat. Given 150 fentanyl by EMS. Pain 4/10 after pain med

## 2018-06-23 ENCOUNTER — Observation Stay (HOSPITAL_BASED_OUTPATIENT_CLINIC_OR_DEPARTMENT_OTHER): Payer: Medicare Other

## 2018-06-23 DIAGNOSIS — I361 Nonrheumatic tricuspid (valve) insufficiency: Secondary | ICD-10-CM | POA: Diagnosis not present

## 2018-06-23 DIAGNOSIS — R001 Bradycardia, unspecified: Secondary | ICD-10-CM

## 2018-06-23 DIAGNOSIS — I340 Nonrheumatic mitral (valve) insufficiency: Secondary | ICD-10-CM | POA: Diagnosis not present

## 2018-06-23 DIAGNOSIS — Z01810 Encounter for preprocedural cardiovascular examination: Secondary | ICD-10-CM

## 2018-06-23 LAB — CBC WITH DIFFERENTIAL/PLATELET
Abs Immature Granulocytes: 0.02 10*3/uL (ref 0.00–0.07)
Basophils Absolute: 0 10*3/uL (ref 0.0–0.1)
Basophils Relative: 1 %
Eosinophils Absolute: 0.1 10*3/uL (ref 0.0–0.5)
Eosinophils Relative: 2 %
HCT: 33.4 % — ABNORMAL LOW (ref 39.0–52.0)
Hemoglobin: 9.7 g/dL — ABNORMAL LOW (ref 13.0–17.0)
Immature Granulocytes: 0 %
Lymphocytes Relative: 17 %
Lymphs Abs: 1.1 10*3/uL (ref 0.7–4.0)
MCH: 25.9 pg — ABNORMAL LOW (ref 26.0–34.0)
MCHC: 29 g/dL — ABNORMAL LOW (ref 30.0–36.0)
MCV: 89.1 fL (ref 80.0–100.0)
Monocytes Absolute: 0.7 10*3/uL (ref 0.1–1.0)
Monocytes Relative: 10 %
Neutro Abs: 4.6 10*3/uL (ref 1.7–7.7)
Neutrophils Relative %: 70 %
Platelets: 209 10*3/uL (ref 150–400)
RBC: 3.75 MIL/uL — ABNORMAL LOW (ref 4.22–5.81)
RDW: 16.7 % — ABNORMAL HIGH (ref 11.5–15.5)
WBC: 6.5 10*3/uL (ref 4.0–10.5)
nRBC: 0 % (ref 0.0–0.2)

## 2018-06-23 LAB — COMPREHENSIVE METABOLIC PANEL
ALT: 24 U/L (ref 0–44)
AST: 25 U/L (ref 15–41)
Albumin: 2.9 g/dL — ABNORMAL LOW (ref 3.5–5.0)
Alkaline Phosphatase: 161 U/L — ABNORMAL HIGH (ref 38–126)
Anion gap: 6 (ref 5–15)
BUN: 27 mg/dL — ABNORMAL HIGH (ref 8–23)
CO2: 24 mmol/L (ref 22–32)
Calcium: 8.2 mg/dL — ABNORMAL LOW (ref 8.9–10.3)
Chloride: 112 mmol/L — ABNORMAL HIGH (ref 98–111)
Creatinine, Ser: 1.25 mg/dL — ABNORMAL HIGH (ref 0.61–1.24)
GFR calc Af Amer: >60 mL/min (ref >60)
GFR calc non Af Amer: 56 mL/min — ABNORMAL LOW (ref >60)
Glucose, Bld: 115 mg/dL — ABNORMAL HIGH (ref 70–99)
Potassium: 3.5 mmol/L (ref 3.5–5.1)
Sodium: 142 mmol/L (ref 135–145)
Total Bilirubin: 0.8 mg/dL (ref 0.3–1.2)
Total Protein: 6.3 g/dL — ABNORMAL LOW (ref 6.5–8.1)

## 2018-06-23 LAB — ECHOCARDIOGRAM COMPLETE
Height: 71 in
Weight: 2640.02 oz

## 2018-06-23 LAB — MAGNESIUM: Magnesium: 2 mg/dL (ref 1.7–2.4)

## 2018-06-23 MED ORDER — LIDOCAINE 5 % EX PTCH
2.0000 | MEDICATED_PATCH | CUTANEOUS | Status: DC
  Administered 2018-06-23: 2 via TRANSDERMAL
  Filled 2018-06-23 (×5): qty 2

## 2018-06-23 NOTE — Progress Notes (Addendum)
Pt c/o inability to void despite having an urge. Bladder scan performed showing >607 mL. NP on call, Schorr, paged for I&O cath order. Foley cath ordered and placed. 1400 mL clear yellow urine emptied. Pt verbalizes relief. Pt's HR has been between the 30's-50's throughout the night tonight. He has remained in A fib and is asymptomatic. Will continue to monitor.

## 2018-06-23 NOTE — Progress Notes (Signed)
Have had multiple calls from [place] regarding patient's heart rate dropping down into the upper 20's, but not sustaining there. Most of time when pt is checked he has been dozing, but arouses easily and heart rate will rise to 40-50s. He continues to deny SOB, chest pain and Vital Signs have been stable. Will continue to monitor. Eulas Post, RN

## 2018-06-23 NOTE — Progress Notes (Signed)
Initial Nutrition Assessment  DOCUMENTATION CODES:   Not applicable  INTERVENTION:  - Continue to encourage PO intakes. - Will continue to monitor for nutrition-related needs.   NUTRITION DIAGNOSIS:   Inadequate oral intake related to social / environmental circumstances as evidenced by per patient/family report.  GOAL:   Patient will meet greater than or equal to 90% of their needs  MONITOR:   PO intake, Weight trends, Labs  REASON FOR ASSESSMENT:   Malnutrition Screening Tool  ASSESSMENT:   78 y.o. male with past medical history of AAA, CAD s/p CABG, carotid stenosis, CVA, CHF, cirrhosis of the liver, COPD, HTN, HLD, chronic back and shoulder pain, Mobitz type I AV block. He presented to the ED with complaints of L hip pain. Patient had undergone L hip arthroplasty in 01/2018. In the ED he was found to have dislocation of hip. He was given conscious sedation and reduction was performed in the ED.  BMI indicates normal weight. Patient reports eating ~75% of breakfast this AM (305 kcal, 25 grams of protein). Patient had RD order lunch for him: pork chop, corn, dinner roll, collard greens, and 2 cups of cranberry juice.   Patient denies any abdominal pain or nausea now or PTA. He does like foods to be softer so he is able to chew them well, but wanted to try a pork chop today. He denies any swallowing difficulties.   Patient states that appetite has been good since admission. He indicates that PTA appetite would fluctuate, or be "ok", on average. He indicates that this is d/t living alone and not finding the joy in eating that he once did. He denies offer for ONS at this time; will continue to monitor intakes and if this topic needs to be re-visited.   Per chart review, current weight is 165 lb and weight on 09/16/17 was 169 lb. This indicates 4 lb weight loss (2.4% body weight) in the past 8-8.5 months; not significant for time frame.   Medications reviewed; 80 mg oral  lasix/day, 1 packet miralax/day, 40 mEq K-Dur x1 dose 3/3, 25 mg aldactone/day.  Labs reviewed; Cl: 112 mmol/l, BUN: 27 mg/dl, creatinine: 1.25 mg/dl, Ca: 8.2 mg/dl, Alk Phos elevated, GFR: 56 ml/min.    NUTRITION - FOCUSED PHYSICAL EXAM:  Completed; no muscle and no fat wasting, no edema noted at this time.   Diet Order:   Diet Order            Diet heart healthy/carb modified Room service appropriate? Yes; Fluid consistency: Thin  Diet effective now              EDUCATION NEEDS:   No education needs have been identified at this time  Skin:  Skin Assessment: Reviewed RN Assessment  Last BM:  3/2  Height:   Ht Readings from Last 1 Encounters:  06/22/18 5' 11"  (1.803 m)    Weight:   Wt Readings from Last 1 Encounters:  06/22/18 74.8 kg    Ideal Body Weight:  78.18 kg  BMI:  Body mass index is 23.01 kg/m.  Estimated Nutritional Needs:   Kcal:  1645-1870 kcal  Protein:  65-75 grams  Fluid:  >/= 1.8 L/day     Brian Matin, MS, RD, LDN, [facility] Inpatient Clinical Dietitian Pager # [phone] After hours/weekend pager # [phone]

## 2018-06-23 NOTE — Progress Notes (Signed)
PROGRESS NOTE  [name] AGT:[id number] DOB: 1940/12/04 DOA: 06/22/2018 PCP: [name], MD  HPI/Recap of past 24 hours: [name] is a 78 y.o. male with Past medical history of AAA, CAD S/P CABG, carotid stenosis, CVA, chronic diastolic CHF, cirrhosis of the liver, COPD, HTN, HLD, Mobitz type I AV block. The patient presents with complaints of hip pain. Underwent left hip arthroplasty October 2019.  After that patient was seen in the ER hip on 06/12/2018.  Patient was earlier seen in the morning of 06/22/2018 for hip pain and was found to have dislocation of the hip which was corrected and patient was sent home. Patient comes to the hospital again with dislocation of the left hip with hip pain. Given conscious sedation and reduction was performed in the ER. Patient denies having any trauma, fall, injury.  No fever no chills.  No chest pain abdominal pain at the time of my evaluation. Patient does have chronic back pain as well as shoulder pain. No diarrhea reported patient actually has constipation. No recent change in medications reported as well. Patient tells me that when he saw his primary cardiologist he was informed that he may require a pacemaker in the near future.  Patient reports compliant with all his medication including pain medication.  No alcohol or drug abuse reported by the patient as well.  Patient's hip was reduced in the ER under conscious sedation.  EDP discussed with Dr. Alvan Dame who recommends the patient to be admitted in the hospital.  No further plan available from orthopedic providers.  At his baseline ambulates with assistance And is independent for most of his ADL; manages his medication on his own.  06/23/18: Patient seen and examined at bedside.  No acute events overnight.  Reports pain in his left knee.  Lidocaine patch ordered.  A. fib with slow ventricular response noted on telemetry this morning with history of second-degree AV block Mobitz  type I.  Cardiology consulted.  Followed by Dr. Stanford Breed and Dr. Caryl Comes.   Assessment/Plan: Principal Problem:   Failed total hip arthroplasty with dislocation, initial encounter [facility]) Active Problems:   Hyperlipidemia   Essential hypertension   COPD (chronic obstructive pulmonary disease) (HCC)   Abdominal aortic aneurysm (HCC)   Chronic back pain   Cirrhosis (HCC)   Bladder outlet obstruction   Second degree AV block, Mobitz type I   Aortic stenosis   Opiate dependence (HCC)   Chronic diastolic heart failure (HCC)   CKD (chronic kidney disease) stage 3, GFR 30-59 ml/min (HCC)  Recurrent left hip dislocation post arthroplasty Self-reported dislocation x3 post arthroplasty Orthopedic surgery consulted and following Possibly evaluation in the OR tomorrow 06/24/2018 Supportive management  Second-degree AV block Mobitz type I Followed outpatient with Dr. Caryl Comes Cardiology consulted May require pacemaker  Paroxysmal A. fib with slow ventricular response Chads Vascor 5 Defer to cardiology to start IV anticoagulation Possible surgical intervention tomorrow 06/24/2018  Coronary artery disease status post CABG Denies anginal symptoms Continue aspirin and statin  Chronic diastolic CHF Last 2D echo done on 2017 revealed LVEF 65 to 70% with grade 1 diastolic dysfunction Euvolemic No sign of acute exacerbation  COPD Continue as needed albuterol nebs  Cirrhosis Appears compensated Continue Lasix, lactulose  AAA Appears stable Asymptomatic 3 x 3.2 cm of on eighth 27 2019  Hypertension  - Continue hydralazine as tolerated  CKD stage III -SCr is 1.3 on admission, slightly better from recent. -He appears mildly volume overloaded.  We  will continue with diuretics.   Nutrition: Cardiac and carb modified diet DVT Prophylaxis: subcutaneous Heparin  Advance goals of care discussion: full code   Consults: orthopedics   Family Communication: no family was present at  bedside, at the time of interview.     Objective: Vitals:   06/23/18 0119 06/23/18 0447 06/23/18 0846 06/23/18 1117  BP: (!) 142/55 (!) 108/44 (!) 120/44 129/61  Pulse: (!) 50 (!) 44 (!) 47 (!) 54  Resp: 18 16 13 12   Temp: 97.8 F (36.6 C) 97.9 F (36.6 C) 98 F (36.7 C) 98 F (36.7 C)  TempSrc: Oral Oral Oral Oral  SpO2: 95% 92% 97% 97%  Weight:      Height:        Intake/Output Summary (Last 24 hours) at 06/23/2018 1306 Last data filed at 06/23/2018 0600 Gross per 24 hour  Intake 480 ml  Output 1800 ml  Net -1320 ml   Filed Weights   06/22/18 1750  Weight: 74.8 kg    Exam:  . General: 78 y.o. year-old male well developed well nourished in no acute distress.  Alert and oriented x3. . Cardiovascular: Regular rate and rhythm with no rubs or gallops.  No thyromegaly or JVD noted.   [name] Respiratory: Clear to auscultation with no wheezes or rales. Good inspiratory effort. . Abdomen: Soft nontender nondistended with normal bowel sounds x4 quadrants. . Musculoskeletal: 1+ lower extremity edema. 2/4 pulses in all 4 extremities. LLE in surgical binder. [name] Psychiatry: Mood is appropriate for condition and setting   Data Reviewed: CBC: Recent Labs  Lab 06/22/18 1241 06/23/18 0408  WBC 7.2 6.5  NEUTROABS 5.7 4.6  HGB 10.2* 9.7*  HCT 34.0* 33.4*  MCV 87.6 89.1  PLT 244 144   Basic Metabolic Panel: Recent Labs  Lab 06/22/18 1241 06/23/18 0408  NA 138 142  K 3.3* 3.5  CL 107 112*  CO2 24 24  GLUCOSE 132* 115*  BUN 34* 27*  CREATININE 1.34* 1.25*  CALCIUM 8.4* 8.2*  MG  --  2.0   GFR: Estimated Creatinine Clearance: 54 mL/min (A) (by C-G formula based on SCr of 1.25 mg/dL (H)). Liver Function Tests: Recent Labs  Lab 06/23/18 0408  AST 25  ALT 24  ALKPHOS 161*  BILITOT 0.8  PROT 6.3*  ALBUMIN 2.9*   No results for input(s): LIPASE, AMYLASE in the last 168 hours. No results for input(s): AMMONIA in the last 168 hours. Coagulation Profile: No results for  input(s): INR, PROTIME in the last 168 hours. Cardiac Enzymes: No results for input(s): CKTOTAL, CKMB, CKMBINDEX, TROPONINI in the last 168 hours. BNP (last 3 results) No results for input(s): PROBNP in the last 8760 hours. HbA1C: No results for input(s): HGBA1C in the last 72 hours. CBG: No results for input(s): GLUCAP in the last 168 hours. Lipid Profile: No results for input(s): CHOL, HDL, LDLCALC, TRIG, CHOLHDL, LDLDIRECT in the last 72 hours. Thyroid Function Tests: No results for input(s): TSH, T4TOTAL, FREET4, T3FREE, THYROIDAB in the last 72 hours. Anemia Panel: No results for input(s): VITAMINB12, FOLATE, FERRITIN, TIBC, IRON, RETICCTPCT in the last 72 hours. Urine analysis:    Component Value Date/Time   COLORURINE YELLOW 01/21/2017 0450   APPEARANCEUR CLEAR 01/21/2017 0450   LABSPEC 1.011 01/21/2017 0450   PHURINE 5.0 01/21/2017 0450   GLUCOSEU NEGATIVE 01/21/2017 0450   HGBUR NEGATIVE 01/21/2017 0450   BILIRUBINUR 3+ 09/16/2017 1617   KETONESUR NEGATIVE 01/21/2017 0450   PROTEINUR Positive (A) 09/16/2017 1617  PROTEINUR NEGATIVE 01/21/2017 0450   UROBILINOGEN 2.0 (A) 09/16/2017 1617   UROBILINOGEN 1.0 07/28/2013 2005   NITRITE pos 09/16/2017 1617   NITRITE NEGATIVE 01/21/2017 0450   LEUKOCYTESUR Large (3+) (A) 09/16/2017 1617   Sepsis Labs: @LABRCNTIP (procalcitonin:4,lacticidven:4)  )No results found for this or any previous visit (from the past 240 hour(s)).    Studies: Dg Pelvis 1-2 Views  Result Date: 06/22/2018 CLINICAL DATA:  Fall 1 week ago with hip dislocation EXAM: PELVIS - 1-2 VIEW COMPARISON:  None. FINDINGS: There has been dislocation of the femoral prosthesis from the acetabulum. No definitive fracture is seen. Degenerative changes of lumbar spine are noted. No soft tissue abnormality is seen. IMPRESSION: Dislocation of the femoral prosthesis from the acetabulum. Electronically Signed   By: Inez Catalina M.D.   On: 06/22/2018 14:57   Chest Portable  1 View  Result Date: 06/22/2018 CLINICAL DATA:  Failed total hip arthroplasty with dislocation, initial encounter. Preop cardiovascular exam. EXAM: PORTABLE CHEST 1 VIEW COMPARISON:  Chest radiograph 01/26/2018 FINDINGS: Post median sternotomy. Unchanged heart size and mediastinal contours with borderline cardiomegaly. No focal airspace disease, pulmonary edema, pleural effusion or pneumothorax. No acute osseous abnormalities are seen. IMPRESSION: No acute chest findings. Electronically Signed   By: Keith Rake M.D.   On: 06/22/2018 19:13   Dg Hip Port Unilat W Or Wo Pelvis 1 View Left  Result Date: 06/22/2018 CLINICAL DATA:  Postreduction. EXAM: DG HIP (WITH OR WITHOUT PELVIS) 1V PORT LEFT COMPARISON:  LEFT hip radiograph June 22, 2018 at 0414 hours. FINDINGS: Status post LEFT hip hemiarthroplasty with intact well-seated hardware. No periprosthetic lucency. No fracture deformity or dislocation by single frontal radiograph. Soft tissue planes are non suspicious. IMPRESSION: Status post LEFT hip hemiarthroplasty without fracture deformity or dislocation by single view. Electronically Signed   By: Elon Alas M.D.   On: 06/22/2018 15:52    Scheduled Meds: . aspirin  81 mg Oral Daily  . docusate sodium  100 mg Oral BID  . finasteride  5 mg Oral Daily  . fluticasone  1 spray Each Nare Daily  . furosemide  80 mg Oral Daily  . heparin  5,000 Units Subcutaneous Q8H  . lidocaine  2 patch Transdermal Q24H  . polyethylene glycol  17 g Oral Daily  . spironolactone  25 mg Oral Daily  . tamsulosin  0.4 mg Oral Daily    Continuous Infusions: . methocarbamol (ROBAXIN) IV       LOS: 0 days     Kayleen Memos, MD [facility] Pager [phone]  If 7PM-7AM, please contact night-coverage [email or web address] Password [email or web address] 06/23/2018, 1:06 PM

## 2018-06-23 NOTE — Care Management Obs Status (Signed)
[place] NOTIFICATION   Patient Details  Name: [name] MRN: [id number] Date of Birth: Jul 08, 1940   Medicare Observation Status Notification Given:  Yes    Purcell Mouton, RN 06/23/2018, 2:07 PM

## 2018-06-23 NOTE — Consult Note (Signed)
Reason for Consult:  Instability left hip hemiarthroplasty Referring Physician: Medicine  [name] is an 78 y.o. male.  HPI:  The patient presented to the ER due to pain and inability to bear weight on the left leg.  He had a left hip hemiarthroplasty in October 2019 per Dr. Alvan Mcdowell.  In late February the patient slipped getting out of the shower and fell to his knee. At that time he wasn't able to move and it was determined he had a hip dislocation that was reduced in the ER.  Since he has had 2 more incidents of dislocations, but there was no trauma associated with the later 2 events.  The last event has been the cause of this latest admission to the hospital.  I have discussed the patients hip instability and the need for surgery to help stabilize the hip.  Dr. Alvan Mcdowell is planning on converting the left hip hemiarthroplasty to a total hip arthroplasty.  As long as he is cleared he will be scheduled for 06/24/2018, patient stated that cardiology will be seeing the patient with regards to possible pacemaker.  Risks, benefits and expectations were discussed with the patient.  Risks including but not limited to the risk of anesthesia, blood clots, nerve damage, blood vessel damage, failure of the prosthesis, infection and up to and including death.  Patient understand the risks, benefits and expectations and wishes to proceed with surgery.   Past Medical History:  Diagnosis Date  . AAA (abdominal aortic aneurysm) ([place])   . Blindness of left eye   . 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 CAD (coronary artery disease)    a. s/p CABG in 10/2008 with LIMA-LAD, SVG-OM1 with Y-graft to PL branch, and SVG-RCA  . Carotid stenosis   . Cerebrovascular disease   . CHF (congestive heart failure) ([place])   . Cirrhosis ([place])    on CT a/p 07/2013, no longer drinking  . CONGENITAL UNSPEC REDUCTION DEFORMITY LOWER LIMB   . Constipation due to opioid therapy 2014   began about a year ago   . COPD (chronic obstructive pulmonary disease) ([place])   . Foley catheter in place   . HTN (hypertension)   . Hyperlipidemia   . 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 Neuromuscular disorder ([place])    PT STATES HE HAS BEEN TOLD HE EITHER HAD POLIO OR CEREBRAL PALSY - STATES HIS LEFT LEG IS SHORTER AND AFFECTS HIS BALANCE - HE WEARS ELEVATED SHOE -LEFT  . Pain    BACK, LEGS AND ARMS  . Shortness of breath    EXERTION  . TOBACCO USE, QUIT     Past Surgical History:  Procedure Laterality Date  . CORONARY ARTERY BYPASS GRAFT  11/03/2008   Ricard Dillon - x4: left internal mammary artery to the distal left anterior descending, saphemous vein graft to the first circumflex marginal branch with a Y graft sequentiallly to a left posterolateral branch, saphenous vein graft to the distal right coronary artery  . ELECTROPHYSIOLOGIC STUDY N/A 01/18/2015   Procedure: A-Flutter Ablation;  Surgeon: Deboraha Sprang, MD;  Location: [facility];  Service: Cardiovascular;  Laterality: N/A;  . GREEN LIGHT LASER TURP (TRANSURETHRAL RESECTION OF PROSTATE N/A 02/14/2014   Procedure: GREEN LIGHT LASER TURP (TRANSURETHRAL RESECTION OF PROSTATE;  Surgeon: Festus Aloe, MD;  Location: [facility];  Service: Urology;  Laterality: N/A;  . HIP ARTHROPLASTY Left 01/26/2018  Procedure: LEFT HIP POSTERIOR HEMIARTHROPLASTY;  Surgeon: [name], MD;  Location: [facility];  Service: Orthopedics;  Laterality: Left;    Family History  Problem Relation Age of Onset  . Cirrhosis Mother        died at 31  . Heart attack Father   . Other Brother        GSW  . Other Brother        died in house fire  . Cancer Brother        unsure of type Believes colon or prostate/fim  . Colon cancer Neg Hx   . Stomach cancer Neg Hx     Social History:  reports that he has quit smoking. He has never used smokeless tobacco. He reports that he does not drink  alcohol or use drugs.  Allergies:  Allergies  Allergen Reactions  . Doxycycline   . Amlodipine Nausea And Vomiting and Other (See Comments)    dizziness  . Fish Allergy Nausea And Vomiting    STATES HE HAS NOT EATEN ANY FISH INCLUDING SHELLFISH FOR PAST 40 YRS - IT CAUSED NAUSEA  . Hydrocodone Itching and Rash  . Other Nausea And Vomiting    STATES HE HAS NOT EATEN ANY FISH INCLUDING SHELLFISH FOR PAST 40 YRS - IT CAUSED NAUSEA  . Tylenol [Acetaminophen] Other (See Comments)    Liver problems      Results for orders placed or performed during the hospital encounter of 06/22/18 (from the past 48 hour(s))  Basic metabolic panel     Status: Abnormal   Collection Time: 06/22/18 12:41 PM  Result Value Ref Range   Sodium 138 135 - 145 mmol/L   Potassium 3.3 (L) 3.5 - 5.1 mmol/L   Chloride 107 98 - 111 mmol/L   CO2 24 22 - 32 mmol/L   Glucose, Bld 132 (H) 70 - 99 mg/dL   BUN 34 (H) 8 - 23 mg/dL   Creatinine, Ser 1.34 (H) 0.61 - 1.24 mg/dL   Calcium 8.4 (L) 8.9 - 10.3 mg/dL   GFR calc non Af Amer 51 (L) >60 mL/min   GFR calc Af Amer 60 (L) >60 mL/min   Anion gap 7 5 - 15    Comment: Performed at [facility], [place] [address] [place], [place] [address]  CBC with Differential     Status: Abnormal   Collection Time: 06/22/18 12:41 PM  Result Value Ref Range   WBC 7.2 4.0 - 10.5 K/uL   RBC 3.88 (L) 4.22 - 5.81 MIL/uL   Hemoglobin 10.2 (L) 13.0 - 17.0 g/dL   HCT 34.0 (L) 39.0 - 52.0 %   MCV 87.6 80.0 - 100.0 fL   MCH 26.3 26.0 - 34.0 pg   MCHC 30.0 30.0 - 36.0 g/dL   RDW 16.6 (H) 11.5 - 15.5 %   Platelets 244 150 - 400 K/uL   nRBC 0.0 0.0 - 0.2 %   Neutrophils Relative % 79 %   Neutro Abs 5.7 1.7 - 7.7 K/uL   Lymphocytes Relative 12 %   Lymphs Abs 0.9 0.7 - 4.0 K/uL   Monocytes Relative 7 %   Monocytes Absolute 0.5 0.1 - 1.0 K/uL   Eosinophils Relative 1 %   Eosinophils Absolute 0.1 0.0 - 0.5 K/uL   Basophils Relative 1 %   Basophils Absolute 0.0 0.0 -  0.1 K/uL   Immature Granulocytes 0 %   Abs Immature Granulocytes 0.03 0.00 - 0.07 K/uL    Comment:  Performed at [facility], [place] [address], [place], [place] [address]  CBC with Differential/Platelet     Status: Abnormal   Collection Time: 06/23/18  4:08 AM  Result Value Ref Range   WBC 6.5 4.0 - 10.5 K/uL   RBC 3.75 (L) 4.22 - 5.81 MIL/uL   Hemoglobin 9.7 (L) 13.0 - 17.0 g/dL   HCT 33.4 (L) 39.0 - 52.0 %   MCV 89.1 80.0 - 100.0 fL   MCH 25.9 (L) 26.0 - 34.0 pg   MCHC 29.0 (L) 30.0 - 36.0 g/dL   RDW 16.7 (H) 11.5 - 15.5 %   Platelets 209 150 - 400 K/uL   nRBC 0.0 0.0 - 0.2 %   Neutrophils Relative % 70 %   Neutro Abs 4.6 1.7 - 7.7 K/uL   Lymphocytes Relative 17 %   Lymphs Abs 1.1 0.7 - 4.0 K/uL   Monocytes Relative 10 %   Monocytes Absolute 0.7 0.1 - 1.0 K/uL   Eosinophils Relative 2 %   Eosinophils Absolute 0.1 0.0 - 0.5 K/uL   Basophils Relative 1 %   Basophils Absolute 0.0 0.0 - 0.1 K/uL   Immature Granulocytes 0 %   Abs Immature Granulocytes 0.02 0.00 - 0.07 K/uL    Comment: Performed at [facility], [place] [address], [place], [place] [address]  Comprehensive metabolic panel     Status: Abnormal   Collection Time: 06/23/18  4:08 AM  Result Value Ref Range   Sodium 142 135 - 145 mmol/L   Potassium 3.5 3.5 - 5.1 mmol/L   Chloride 112 (H) 98 - 111 mmol/L   CO2 24 22 - 32 mmol/L   Glucose, Bld 115 (H) 70 - 99 mg/dL   BUN 27 (H) 8 - 23 mg/dL   Creatinine, Ser 1.25 (H) 0.61 - 1.24 mg/dL   Calcium 8.2 (L) 8.9 - 10.3 mg/dL   Total Protein 6.3 (L) 6.5 - 8.1 g/dL   Albumin 2.9 (L) 3.5 - 5.0 g/dL   AST 25 15 - 41 U/L   ALT 24 0 - 44 U/L   Alkaline Phosphatase 161 (H) 38 - 126 U/L   Total Bilirubin 0.8 0.3 - 1.2 mg/dL   GFR calc non Af Amer 56 (L) >60 mL/min   GFR calc Af Amer >60 >60 mL/min   Anion gap 6 5 - 15    Comment: Performed at [facility], [place] [address] [place], [place] [address]  Magnesium     Status:  None   Collection Time: 06/23/18  4:08 AM  Result Value Ref Range   Magnesium 2.0 1.7 - 2.4 mg/dL    Comment: Performed at [facility], [place] [address], [place], [place] [address]    Dg Pelvis 1-2 Views  Result Date: 06/22/2018 CLINICAL DATA:  Fall 1 week ago with hip dislocation EXAM: PELVIS - 1-2 VIEW COMPARISON:  None. FINDINGS: There has been dislocation of the femoral prosthesis from the acetabulum. No definitive fracture is seen. Degenerative changes of lumbar spine are noted. No soft tissue abnormality is seen. IMPRESSION: Dislocation of the femoral prosthesis from the acetabulum. Electronically Signed   By: Inez Catalina M.D.   On: 06/22/2018 14:57   Chest Portable 1 View  Result Date: 06/22/2018 CLINICAL DATA:  Failed total hip arthroplasty with dislocation, initial encounter. Preop cardiovascular exam. EXAM: PORTABLE CHEST 1 VIEW COMPARISON:  Chest radiograph 01/26/2018 FINDINGS: Post median sternotomy. Unchanged heart size and mediastinal contours with borderline cardiomegaly.  No focal airspace disease, pulmonary edema, pleural effusion or pneumothorax. No acute osseous abnormalities are seen. IMPRESSION: No acute chest findings. Electronically Signed   By: Keith Rake M.D.   On: 06/22/2018 19:13   Dg Hip Unilat W Or Wo Pelvis 1 View Left  Result Date: 06/22/2018 CLINICAL DATA:  Hip pain EXAM: DG HIP (WITH OR WITHOUT PELVIS) 1V*L* COMPARISON:  06/12/2018 FINDINGS: Dislocation of the left femoral prosthesis from the acetabulum with rotated appearance of the femur. No gross fracture allowing for positioning. IMPRESSION: Dislocation of the left femoral prosthesis with rotated appearance of the femur. Electronically Signed   By: Donavan Foil M.D.   On: 06/22/2018 01:49   Dg Hip Port Unilat W Or Wo Pelvis 1 View Left  Result Date: 06/22/2018 CLINICAL DATA:  Postreduction. EXAM: DG HIP (WITH OR WITHOUT PELVIS) 1V PORT LEFT COMPARISON:  LEFT hip radiograph June 22, 2018  at 0414 hours. FINDINGS: Status post LEFT hip hemiarthroplasty with intact well-seated hardware. No periprosthetic lucency. No fracture deformity or dislocation by single frontal radiograph. Soft tissue planes are non suspicious. IMPRESSION: Status post LEFT hip hemiarthroplasty without fracture deformity or dislocation by single view. Electronically Signed   By: Elon Alas M.D.   On: 06/22/2018 15:52   Dg Hip Port Unilat W Or Wo Pelvis 1 View Left  Result Date: 06/22/2018 CLINICAL DATA:  Hip reduction EXAM: DG HIP (WITH OR WITHOUT PELVIS) 1V PORT LEFT COMPARISON:  Earlier today FINDINGS: A left hip hemiarthroplasty appears reduced. No evident fracture as permitted by limited projection. IMPRESSION: Left hip arthroplasty has been reduced in the frontal projection. Electronically Signed   By: Monte Fantasia M.D.   On: 06/22/2018 04:44    Review of Systems  Constitutional: Negative.   HENT: Negative.   Eyes: Negative.   Respiratory: Positive for shortness of breath (with exertion).   Cardiovascular: Negative.   Gastrointestinal: Positive for constipation.  Genitourinary: Negative.   Musculoskeletal: Positive for back pain and joint pain.  Skin: Negative.   Neurological: Negative.   Endo/Heme/Allergies: Negative.   Psychiatric/Behavioral: Negative.    Blood pressure (!) 120/44, pulse (!) 47, temperature 98 F (36.7 C), temperature source Oral, resp. rate 13, height 5\' 11"  (1.803 m), weight 74.8 kg, SpO2 97 %. Physical Exam  Constitutional: He is oriented to person, place, and time. He appears well-developed.  HENT:  Head: Normocephalic.  Eyes: Pupils are equal, round, and reactive to light.  Neck: Neck supple. No JVD present. No tracheal deviation present. No thyromegaly present.  Cardiovascular: Normal rate, regular rhythm and intact distal pulses.  Respiratory: Effort normal and breath sounds normal. No respiratory distress. He has no wheezes.  GI: Soft. There is no abdominal  tenderness. There is no guarding.  Musculoskeletal:     Left hip: He exhibits decreased range of motion, decreased strength, tenderness and bony tenderness. He exhibits no swelling, no deformity and no laceration (healed previous incision).  Lymphadenopathy:    He has no cervical adenopathy.  Neurological: He is alert and oriented to person, place, and time.  Skin: Skin is warm and dry.  Psychiatric: He has a normal mood and affect.    Assessment/Plan: Instability left hip hemiarthroplasty   If cleared, Dr. Alvan Mcdowell plans on surgery tomorrow 06/24/2018 in the AM  Surgery planned is to revise the left hip hemiarthroplasty to total hip replacement  Urgent order set placed  NPO after midnight  Stop current anticoagulation when appropriate for surgery tomorrow  Risks, benefits and expectations were  discussed with the patient.  Risks including but not limited to the risk of anesthesia, blood clots, nerve damage, blood vessel damage, failure of the prosthesis, infection and up to and including death.  Patient understand the risks, benefits and expectations and wishes to proceed with surgery.     Lucille Passy Akila Batta 06/23/2018, 10:15 AM

## 2018-06-23 NOTE — Progress Notes (Signed)
PT Cancellation Note  Patient Details Name: [name] MRN: [id number] DOB: 05/23/1940   Cancelled Treatment:    Reason Eval/Treat Not Completed: Medical issues which prohibited therapy. Pt may be having surgery 06/24/18 per ortho notes.  Will check back and await further orders from ortho.   Galen Manila 06/23/2018, 12:04 PM

## 2018-06-23 NOTE — Progress Notes (Signed)
Cardiology notified of patient's heart rate throughout the day. Pt remains asymptomatic and sustains his blood pressure. No further orders obtained. Eulas Post, RN

## 2018-06-23 NOTE — Consult Note (Signed)
Cardiology Consult    Patient ID: [name] MRN: [id number], DOB/AGE: 01-04-1941   Admit date: 06/22/2018 Date of Consult: 06/23/2018  Primary Physician: [name], MD Primary Cardiologist: Kirk Ruths, MD  Primary Electrophysiologist: Virl Axe, MD Requesting Provider: Berle Mull, MD  Patient Profile    [name] is a 78 y.o. 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 Posey Pronto.  History of Present Illness    [name] is a 78 year old male with the above history who is followed by Dr. Stanford Breed. Patient had atrial flutter ablation in 12/2014. Dr. Caryl Comes ordered at Holter Monitor in 01/2015 which showed significant pauses (one being 3.5 seconds) and heart block but patient was asleep and conservative management was recommended. Patient last saw Dr. Stanford Breed on 03/03/2018 for follow-up at which time patient reported a recent fall with a hip fracture but was stable from a cardiac standpoint. Patient had a mechanical fall on 06/12/2018 and dislocated his left hip at that time. He was seen in the ED at that time and his hip was reduced.  Patient presented to the ED again yesterday for evaluation of left hip pain which was found to be dislocated. His hip was again reduced and he was discharged. Per chart review, patient went home and sat down and his him immediately re-dislocated; therefore, patient returned to the ED. Ortho recommend admitting the patient for further workup. Cardiology was consulted for evaluation of patient's bradycardia given his history of Wenckebach. Per review of telemetry, it looks like patient is in atrial fibrillation with slow ventricular  rate.  Patient denies any palpitations. He has some occasional lightheadedness/dizziness but states it is not very bad and does not occur often. He denies any chest pain. He has occasional shortness of breath, especially with exertion, but he attributes this to his COPD. He denies any worsening shortness of breath for baseline. Patient notes some mild lower extremity edema that improves with compression stocking and elevation of his legs. Patient describes possible orthopnea but no PND. No recent fevers or illnesses. No abnormal bleeding.   At the time of this evaluation, patient is asymptomatic.  Patient is a former smoker but quit in 2010 when he had his heart attack. He denies any alcohol or recreational drug use. Patient does have a family history of CAD with his father dying from a heart attack at the age of 89.   Past Medical History   Past Medical History:  Diagnosis Date  . AAA (abdominal aortic aneurysm) ([place])   . Blindness of left eye   . 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 CAD (coronary artery disease)    a. s/p CABG in 10/2008 with LIMA-LAD, SVG-OM1 with Y-graft to PL branch, and SVG-RCA  . Carotid stenosis   . Cerebrovascular disease   . CHF (congestive heart failure) ([place])   . Cirrhosis ([place])    on CT a/p 07/2013, no longer drinking  . CONGENITAL UNSPEC REDUCTION DEFORMITY LOWER LIMB   . Constipation due to opioid therapy 2014   began about a year ago  . COPD (chronic obstructive pulmonary disease) ([place])   . Foley catheter in place   .  HTN (hypertension)   . Hyperlipidemia   . 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 Neuromuscular disorder ([place])    PT STATES HE HAS BEEN TOLD HE EITHER HAD POLIO OR CEREBRAL PALSY - STATES HIS LEFT LEG IS SHORTER AND AFFECTS HIS BALANCE - HE WEARS ELEVATED SHOE -LEFT  . Pain    BACK, LEGS AND ARMS  . Shortness of breath     EXERTION  . TOBACCO USE, QUIT     Past Surgical History:  Procedure Laterality Date  . CORONARY ARTERY BYPASS GRAFT  11/03/2008   Ricard Dillon - x4: left internal mammary artery to the distal left anterior descending, saphemous vein graft to the first circumflex marginal branch with a Y graft sequentiallly to a left posterolateral branch, saphenous vein graft to the distal right coronary artery  . ELECTROPHYSIOLOGIC STUDY N/A 01/18/2015   Procedure: A-Flutter Ablation;  Surgeon: Deboraha Sprang, MD;  Location: [facility];  Service: Cardiovascular;  Laterality: N/A;  . GREEN LIGHT LASER TURP (TRANSURETHRAL RESECTION OF PROSTATE N/A 02/14/2014   Procedure: GREEN LIGHT LASER TURP (TRANSURETHRAL RESECTION OF PROSTATE;  Surgeon: Festus Aloe, MD;  Location: [facility];  Service: Urology;  Laterality: N/A;  . HIP ARTHROPLASTY Left 01/26/2018   Procedure: LEFT HIP POSTERIOR HEMIARTHROPLASTY;  Surgeon: [name], MD;  Location: [facility];  Service: Orthopedics;  Laterality: Left;     Allergies  Allergies  Allergen Reactions  . Doxycycline   . Amlodipine Nausea And Vomiting and Other (See Comments)    dizziness  . Fish Allergy Nausea And Vomiting    STATES HE HAS NOT EATEN ANY FISH INCLUDING SHELLFISH FOR PAST 40 YRS - IT CAUSED NAUSEA  . Hydrocodone Itching and Rash  . Other Nausea And Vomiting    STATES HE HAS NOT EATEN ANY FISH INCLUDING SHELLFISH FOR PAST 40 YRS - IT CAUSED NAUSEA  . Tylenol [Acetaminophen] Other (See Comments)    Liver problems    Inpatient Medications    . aspirin  81 mg Oral Daily  . docusate sodium  100 mg Oral BID  . finasteride  5 mg Oral Daily  . fluticasone  1 spray Each Nare Daily  . furosemide  80 mg Oral Daily  . heparin  5,000 Units Subcutaneous Q8H  . polyethylene glycol  17 g Oral Daily  . spironolactone  25 mg Oral Daily  . tamsulosin  0.4 mg Oral Daily    Family History    Family History  Problem Relation Age of Onset  . Cirrhosis Mother         died at 58  . Heart attack Father   . Other Brother        GSW  . Other Brother        died in house fire  . Cancer Brother        unsure of type Believes colon or prostate/fim  . Colon cancer Neg Hx   . Stomach cancer Neg Hx    He indicated that his mother is deceased. He indicated that his father is deceased. He indicated that all of his three brothers are deceased. He indicated that his maternal grandmother is deceased. He indicated that his maternal grandfather is deceased. He indicated that his paternal grandmother is deceased. He indicated that his paternal grandfather is deceased. He indicated that the status of his neg hx is unknown.   Social History  Social History   Socioeconomic History  . Marital status: Widowed    Spouse name: Not on file  . Number of children: Not on file  . Years of education: Not on file  . Highest education level: Not on file  Occupational History  . Not on file  Social Needs  . Financial resource strain: Not on file  . Food insecurity:    Worry: Not on file    Inability: Not on file  . Transportation needs:    Medical: Not on file    Non-medical: Not on file  Tobacco Use  . Smoking status: Former Research scientist (life sciences)  . Smokeless tobacco: Never Used  Substance and Sexual Activity  . Alcohol use: No    Alcohol/week: 0.0 standard drinks    Comment: History of heavy alcohol use per pt. Quit many years ago1/1/ 2005  . Drug use: No  . Sexual activity: Never  Lifestyle  . Physical activity:    Days per week: Not on file    Minutes per session: Not on file  . Stress: Not on file  Relationships  . Social connections:    Talks on phone: Not on file    Gets together: Not on file    Attends religious service: Not on file    Active member of club or organization: Not on file    Attends meetings of clubs or organizations: Not on file    Relationship status: Not on file  . Intimate partner violence:    Fear of current or ex partner: Not on file     Emotionally abused: Not on file    Physically abused: Not on file    Forced sexual activity: Not on file  Other Topics Concern  . Not on file  Social History Narrative   lives alone here in [place].  He has 1 son, who lives in the [place], and he has     an uncle, who lives nearby as well.      Review of Systems    Review of Systems  Constitutional: Negative for chills and fever.  HENT: Negative for congestion and sore throat.   Respiratory: Positive for cough and shortness of breath. Negative for hemoptysis.   Cardiovascular: Positive for leg swelling. Negative for chest pain, palpitations, orthopnea and PND.  Gastrointestinal: Negative for nausea and vomiting.  Genitourinary: Negative for hematuria.  Musculoskeletal: Positive for falls. Negative for joint pain.  Neurological: Positive for dizziness. Negative for loss of consciousness.  Endo/Heme/Allergies: Does not bruise/bleed easily.  Psychiatric/Behavioral: Negative for substance abuse.    Physical Exam    Blood pressure 129/61, pulse (!) 54, temperature 98 F (36.7 C), temperature source Oral, resp. rate 12, height 5\' 11"  (1.803 m), weight 74.8 kg, SpO2 97 %.  General: 78 y.o. male resting comfortably in no acute distress. Pleasant and cooperative. HEENT: Normal  Neck: Supple. No carotid bruits or JVD appreciated. Lungs: No increased work of breathing. Clear to auscultation bilaterally. No wheezes, rhonchi, or rales. Heart: Bradycardic with irregular rhythm. Distinct S1 and S2. Soft II/VI systolic murmur noted. No gallops or rubs.  Abdomen: Soft, non-distended, and non-tender to palpation. Bowel sounds present. Extremities: Trace lower extremity edema. Brace on right leg. Radial pulses 2+ and equal bilaterally. Skin: Warm and dry. Neuro: Alert and oriented x3. No focal deficits. Moves all extremities spontaneously. Psych: Normal affect. Responds appropriately.   Labs    Troponin (Point of Care Test) No  results for input(s): TROPIPOC in the last 72  hours. No results for input(s): CKTOTAL, CKMB, TROPONINI in the last 72 hours. Lab Results  Component Value Date   WBC 6.5 06/23/2018   HGB 9.7 (L) 06/23/2018   HCT 33.4 (L) 06/23/2018   MCV 89.1 06/23/2018   PLT 209 06/23/2018    Recent Labs  Lab 06/23/18 0408  NA 142  K 3.5  CL 112*  CO2 24  BUN 27*  CREATININE 1.25*  CALCIUM 8.2*  PROT 6.3*  BILITOT 0.8  ALKPHOS 161*  ALT 24  AST 25  GLUCOSE 115*   Lab Results  Component Value Date   CHOL 125 03/16/2017   HDL 42 03/16/2017   LDLCALC 64 03/16/2017   TRIG 96 03/16/2017   Lab Results  Component Value Date   DDIMER (H) 10/06/2008    2.41        AT THE INHOUSE ESTABLISHED CUTOFF VALUE OF 0.48 ug/mL FEU, THIS ASSAY HAS BEEN DOCUMENTED IN THE LITERATURE TO HAVE A SENSITIVITY AND NEGATIVE PREDICTIVE VALUE OF AT LEAST 98 TO 99%.  THE TEST RESULT SHOULD BE CORRELATED WITH AN ASSESSMENT OF THE CLINICAL PROBABILITY OF DVT / VTE.     Radiology Studies    Dg Pelvis 1-2 Views  Result Date: 06/22/2018 CLINICAL DATA:  Fall 1 week ago with hip dislocation EXAM: PELVIS - 1-2 VIEW COMPARISON:  None. FINDINGS: There has been dislocation of the femoral prosthesis from the acetabulum. No definitive fracture is seen. Degenerative changes of lumbar spine are noted. No soft tissue abnormality is seen. IMPRESSION: Dislocation of the femoral prosthesis from the acetabulum. Electronically Signed   By: Inez Catalina M.D.   On: 06/22/2018 14:57   Chest Portable 1 View  Result Date: 06/22/2018 CLINICAL DATA:  Failed total hip arthroplasty with dislocation, initial encounter. Preop cardiovascular exam. EXAM: PORTABLE CHEST 1 VIEW COMPARISON:  Chest radiograph 01/26/2018 FINDINGS: Post median sternotomy. Unchanged heart size and mediastinal contours with borderline cardiomegaly. No focal airspace disease, pulmonary edema, pleural effusion or pneumothorax. No acute osseous abnormalities are seen.  IMPRESSION: No acute chest findings. Electronically Signed   By: Keith Rake M.D.   On: 06/22/2018 19:13   Dg Hip Unilat W Or Wo Pelvis 1 View Left  Result Date: 06/22/2018 CLINICAL DATA:  Hip pain EXAM: DG HIP (WITH OR WITHOUT PELVIS) 1V*L* COMPARISON:  06/12/2018 FINDINGS: Dislocation of the left femoral prosthesis from the acetabulum with rotated appearance of the femur. No gross fracture allowing for positioning. IMPRESSION: Dislocation of the left femoral prosthesis with rotated appearance of the femur. Electronically Signed   By: Donavan Foil M.D.   On: 06/22/2018 01:49   Dg Hip Port Unilat W Or Wo Pelvis 1 View Left  Result Date: 06/22/2018 CLINICAL DATA:  Postreduction. EXAM: DG HIP (WITH OR WITHOUT PELVIS) 1V PORT LEFT COMPARISON:  LEFT hip radiograph June 22, 2018 at 0414 hours. FINDINGS: Status post LEFT hip hemiarthroplasty with intact well-seated hardware. No periprosthetic lucency. No fracture deformity or dislocation by single frontal radiograph. Soft tissue planes are non suspicious. IMPRESSION: Status post LEFT hip hemiarthroplasty without fracture deformity or dislocation by single view. Electronically Signed   By: Elon Alas M.D.   On: 06/22/2018 15:52   Dg Hip Port Unilat W Or Wo Pelvis 1 View Left  Result Date: 06/22/2018 CLINICAL DATA:  Hip reduction EXAM: DG HIP (WITH OR WITHOUT PELVIS) 1V PORT LEFT COMPARISON:  Earlier today FINDINGS: A left hip hemiarthroplasty appears reduced. No evident fracture as permitted by limited projection. IMPRESSION: Left hip  arthroplasty has been reduced in the frontal projection. Electronically Signed   By: Monte Fantasia M.D.   On: 06/22/2018 04:44   Dg Hip Port Unilat W Or Wo Pelvis 1 View Left  Result Date: 06/12/2018 CLINICAL DATA:  Postreduction left hip EXAM: DG HIP (WITH OR WITHOUT PELVIS) 1V PORT LEFT COMPARISON:  06/12/2018 FINDINGS: Interval reduction of the previously dislocated left hip replacement. Normal AP alignment. No  visible fracture. IMPRESSION: Interval reduction.  No visible dislocation or fracture. Electronically Signed   By: Rolm Baptise M.D.   On: 06/12/2018 15:12   Dg Hip Unilat W Or Wo Pelvis 2-3 Views Left  Result Date: 06/12/2018 CLINICAL DATA:  Left hip pain after fall. EXAM: DG HIP (WITH OR WITHOUT PELVIS) 2-3V LEFT COMPARISON:  Pelvic x-ray dated January 26, 2018. FINDINGS: Superior dislocation of the left hip hemiarthroplasty. No definite fracture. The right hip joint space is preserved. The pubic symphysis and sacroiliac joints are intact. IMPRESSION: 1. Left hip hemiarthroplasty dislocation.  No definite fracture. Electronically Signed   By: Titus Dubin M.D.   On: 06/12/2018 13:23    EKG     EKG: EKG was personally reviewed and demonstrates: Atrial fibrillation with ventricular rate of 46 bpm. Telemetry: Telemetry was personally reviewed and demonstrates: Atrial fibrillation with ventricular rates in the 30's to 70's. Multiple pauses with longest being 3.07 seconds.   Cardiac Imaging    Echocardiogram 01/25/2016: Study Conclusions: - Left ventricle: The cavity size was normal. Wall thickness was   normal. Systolic function was vigorous. The estimated ejection   fraction was in the range of 65% to 70%. Doppler parameters are   consistent with abnormal left ventricular relaxation (grade 1   diastolic dysfunction). - Aortic valve: AV is difficult to see well Peak and mean gradients   though the valve are 20 and 11 mm Hg respectively consistent with   very mild AS Compared to previous echo gradients are relatively   unchanged - Left atrium: The atrium was mildly dilated. - Pulmonary arteries: PA peak pressure: 34 mm Hg (S).  Assessment & Plan    Possible Atrial Fibrillation with Slow Ventricular Response / History of Atrial Flutter s/p Ablation in 2016 / Wenckebach - Patient presented for hip dislocation. Cardiology was consulted for his bradycardia and history of Wenckebach. Patient  was then noted to be in atrial fibrillation with SVR on telemetry. He has a history of atrial fibrillation s/p an ablation in 2016. Holter monitor in 2016 also showed significant pauses (one being 3.5 seconds) and heart block but patient was asleep and asymptomatic so Dr. Caryl Comes recommend conservative management at that time. - Per review of telemetry, it does look like atrial fibrillation with RVR rather than second degree heart block. No clear P waves are visible but possible that the P waves are just very small. Will review with MD. - Ventricular rates ranging from the 30's to the 70's with multiple pauses (longest one being 3.07 seconds). Patient asymptomatic and hemodynamically stable. - Will check Echo. - Avoid any AV nodal agents. - CHA2DS-VASc = 5 (CHF, CAD, HTN, age x2). If truly atrial fibrillation, patient will need long-term anticoagulation with IV Heparin now given planned hip surgery. - Continue to monitor on telemetry. - Although patient is asymptomatic, may need to consider pacemaker placement in the future. Will defer to MD.  CAD s/p CABG in 2010 - Patient denies any chest pain.  - Continue Aspirin and statin.  Chronic Diastolic CHF - Last Echo  in 2017 showed LVEF of 65-70% with grade 1 diastolic dysfunction. - Patient does not appear significantly volume overloaded. No diuresis necessary at this time. - Will recheck Echo.  AAA - Last abdominal ultrasound in 11/2017 showed distal AAA with the largest aortic measurement being 3.0cm, essential unchanged from prior imaging.  - Will need annual abdominal ultrasound in 11/2018.  Carotid Artery Stenosis - Most recent carotid dopplers showed 1-30% stenosis of left ICA, 50-59% stenosis of right ICA, right ECA >50% stenosis, and non-hemodynamically significant plaque <50% noted in the CCA. - Repeat dopplers recommended in 11/2018.   Signed, Darreld Mclean, PA-C 06/23/2018, 11:20 AM  For questions or updates, please contact    Please consult [email or web address] for contact info under Cardiology/STEMI.

## 2018-06-23 NOTE — Progress Notes (Signed)
  Echocardiogram 2D Echocardiogram has been performed.  [name] 06/23/2018, 2:29 PM

## 2018-06-24 ENCOUNTER — Observation Stay (HOSPITAL_COMMUNITY): Payer: Medicare Other | Admitting: Registered Nurse

## 2018-06-24 ENCOUNTER — Inpatient Hospital Stay (HOSPITAL_COMMUNITY): Payer: Medicare Other

## 2018-06-24 ENCOUNTER — Encounter (HOSPITAL_COMMUNITY): Payer: Self-pay | Admitting: *Deleted

## 2018-06-24 ENCOUNTER — Ambulatory Visit: Payer: Medicare Other | Attending: Physical Therapy | Admitting: Physical Therapy

## 2018-06-24 ENCOUNTER — Ambulatory Visit: Payer: Medicare Other | Admitting: Physical Therapy

## 2018-06-24 ENCOUNTER — Encounter (HOSPITAL_COMMUNITY)
Admission: EM | Disposition: A | Discharge status: Home Health | Payer: Self-pay | Source: Home / Self Care | Attending: Internal Medicine

## 2018-06-24 DIAGNOSIS — T84021A Dislocation of internal left hip prosthesis, initial encounter: Secondary | ICD-10-CM | POA: Diagnosis not present

## 2018-06-24 DIAGNOSIS — E785 Hyperlipidemia, unspecified: Secondary | ICD-10-CM | POA: Diagnosis present

## 2018-06-24 DIAGNOSIS — W19XXXA Unspecified fall, initial encounter: Secondary | ICD-10-CM | POA: Diagnosis present

## 2018-06-24 DIAGNOSIS — M24452 Recurrent dislocation, left hip: Secondary | ICD-10-CM | POA: Diagnosis not present

## 2018-06-24 DIAGNOSIS — K5900 Constipation, unspecified: Secondary | ICD-10-CM | POA: Diagnosis present

## 2018-06-24 DIAGNOSIS — G809 Cerebral palsy, unspecified: Secondary | ICD-10-CM | POA: Diagnosis not present

## 2018-06-24 DIAGNOSIS — Z951 Presence of aortocoronary bypass graft: Secondary | ICD-10-CM | POA: Diagnosis not present

## 2018-06-24 DIAGNOSIS — I441 Atrioventricular block, second degree: Secondary | ICD-10-CM | POA: Diagnosis present

## 2018-06-24 DIAGNOSIS — Z8249 Family history of ischemic heart disease and other diseases of the circulatory system: Secondary | ICD-10-CM | POA: Diagnosis not present

## 2018-06-24 DIAGNOSIS — I480 Paroxysmal atrial fibrillation: Secondary | ICD-10-CM | POA: Diagnosis not present

## 2018-06-24 DIAGNOSIS — I714 Abdominal aortic aneurysm, without rupture: Secondary | ICD-10-CM | POA: Diagnosis present

## 2018-06-24 DIAGNOSIS — J449 Chronic obstructive pulmonary disease, unspecified: Secondary | ICD-10-CM | POA: Diagnosis present

## 2018-06-24 DIAGNOSIS — I5032 Chronic diastolic (congestive) heart failure: Secondary | ICD-10-CM | POA: Diagnosis not present

## 2018-06-24 DIAGNOSIS — R748 Abnormal levels of other serum enzymes: Secondary | ICD-10-CM | POA: Diagnosis present

## 2018-06-24 DIAGNOSIS — I130 Hypertensive heart and chronic kidney disease with heart failure and stage 1 through stage 4 chronic kidney disease, or unspecified chronic kidney disease: Secondary | ICD-10-CM | POA: Diagnosis not present

## 2018-06-24 DIAGNOSIS — I2510 Atherosclerotic heart disease of native coronary artery without angina pectoris: Secondary | ICD-10-CM | POA: Diagnosis present

## 2018-06-24 DIAGNOSIS — I472 Ventricular tachycardia: Secondary | ICD-10-CM | POA: Diagnosis not present

## 2018-06-24 DIAGNOSIS — T84028A Dislocation of other internal joint prosthesis, initial encounter: Secondary | ICD-10-CM | POA: Diagnosis not present

## 2018-06-24 DIAGNOSIS — R001 Bradycardia, unspecified: Secondary | ICD-10-CM | POA: Diagnosis not present

## 2018-06-24 DIAGNOSIS — M549 Dorsalgia, unspecified: Secondary | ICD-10-CM | POA: Diagnosis present

## 2018-06-24 DIAGNOSIS — T84498A Other mechanical complication of other internal orthopedic devices, implants and grafts, initial encounter: Secondary | ICD-10-CM | POA: Diagnosis not present

## 2018-06-24 DIAGNOSIS — I6529 Occlusion and stenosis of unspecified carotid artery: Secondary | ICD-10-CM | POA: Diagnosis present

## 2018-06-24 DIAGNOSIS — M25352 Other instability, left hip: Secondary | ICD-10-CM | POA: Diagnosis not present

## 2018-06-24 DIAGNOSIS — K7460 Unspecified cirrhosis of liver: Secondary | ICD-10-CM | POA: Diagnosis present

## 2018-06-24 DIAGNOSIS — Z471 Aftercare following joint replacement surgery: Secondary | ICD-10-CM | POA: Diagnosis not present

## 2018-06-24 DIAGNOSIS — Z96642 Presence of left artificial hip joint: Secondary | ICD-10-CM | POA: Diagnosis not present

## 2018-06-24 DIAGNOSIS — Z7982 Long term (current) use of aspirin: Secondary | ICD-10-CM | POA: Diagnosis not present

## 2018-06-24 DIAGNOSIS — N183 Chronic kidney disease, stage 3 (moderate): Secondary | ICD-10-CM | POA: Diagnosis not present

## 2018-06-24 DIAGNOSIS — Z87891 Personal history of nicotine dependence: Secondary | ICD-10-CM | POA: Diagnosis not present

## 2018-06-24 DIAGNOSIS — G894 Chronic pain syndrome: Secondary | ICD-10-CM | POA: Diagnosis present

## 2018-06-24 DIAGNOSIS — I252 Old myocardial infarction: Secondary | ICD-10-CM | POA: Diagnosis not present

## 2018-06-24 DIAGNOSIS — N400 Enlarged prostate without lower urinary tract symptoms: Secondary | ICD-10-CM | POA: Diagnosis present

## 2018-06-24 DIAGNOSIS — Z96649 Presence of unspecified artificial hip joint: Secondary | ICD-10-CM | POA: Diagnosis not present

## 2018-06-24 DIAGNOSIS — S73005A Unspecified dislocation of left hip, initial encounter: Secondary | ICD-10-CM | POA: Diagnosis present

## 2018-06-24 HISTORY — PX: TOTAL HIP REVISION: SHX763

## 2018-06-24 LAB — SURGICAL PCR SCREEN
MRSA, PCR: NEGATIVE
Staphylococcus aureus: NEGATIVE

## 2018-06-24 LAB — BASIC METABOLIC PANEL
Anion gap: 6 (ref 5–15)
BUN: 26 mg/dL — ABNORMAL HIGH (ref 8–23)
CO2: 25 mmol/L (ref 22–32)
Calcium: 8.6 mg/dL — ABNORMAL LOW (ref 8.9–10.3)
Chloride: 107 mmol/L (ref 98–111)
Creatinine, Ser: 1.28 mg/dL — ABNORMAL HIGH (ref 0.61–1.24)
GFR calc Af Amer: >60 mL/min (ref >60)
GFR calc non Af Amer: 54 mL/min — ABNORMAL LOW (ref >60)
Glucose, Bld: 100 mg/dL — ABNORMAL HIGH (ref 70–99)
Potassium: 3.6 mmol/L (ref 3.5–5.1)
Sodium: 138 mmol/L (ref 135–145)

## 2018-06-24 LAB — POCT I-STAT 4, (NA,K, GLUC, HGB,HCT)
Glucose, Bld: 107 mg/dL — ABNORMAL HIGH (ref 70–99)
HCT: 29 % — ABNORMAL LOW (ref 39.0–52.0)
Hemoglobin: 9.9 g/dL — ABNORMAL LOW (ref 13.0–17.0)
Potassium: 4 mmol/L (ref 3.5–5.1)
Sodium: 138 mmol/L (ref 135–145)

## 2018-06-24 LAB — PREPARE RBC (CROSSMATCH)

## 2018-06-24 SURGERY — TOTAL HIP REVISION
Anesthesia: General | Site: Hip | Laterality: Left

## 2018-06-24 MED ORDER — METOCLOPRAMIDE HCL 5 MG/ML IJ SOLN: 5.0000 mg | Freq: Three times a day (TID) | INTRAMUSCULAR | Status: DC | PRN

## 2018-06-24 MED ORDER — MENTHOL 3 MG MT LOZG
1.0000 | LOZENGE | OROMUCOSAL | Status: DC | PRN
  Filled 2018-06-24: qty 9

## 2018-06-24 MED ORDER — SUGAMMADEX SODIUM 200 MG/2ML IV SOLN
INTRAVENOUS | Status: AC
  Filled 2018-06-24: qty 2

## 2018-06-24 MED ORDER — ROCURONIUM BROMIDE 10 MG/ML (PF) SYRINGE
PREFILLED_SYRINGE | INTRAVENOUS | Status: DC | PRN
  Administered 2018-06-24: 40 mg via INTRAVENOUS
  Administered 2018-06-24: 10 mg via INTRAVENOUS

## 2018-06-24 MED ORDER — KETOROLAC TROMETHAMINE 30 MG/ML IJ SOLN
INTRAMUSCULAR | Status: AC
  Filled 2018-06-24: qty 1

## 2018-06-24 MED ORDER — SODIUM CHLORIDE (PF) 0.9 % IJ SOLN
INTRAMUSCULAR | Status: AC
  Filled 2018-06-24: qty 50

## 2018-06-24 MED ORDER — DIPHENHYDRAMINE HCL 12.5 MG/5ML PO ELIX: 12.5000 mg | ORAL_SOLUTION | ORAL | Status: DC | PRN

## 2018-06-24 MED ORDER — TRANEXAMIC ACID-NACL 1000-0.7 MG/100ML-% IV SOLN
1000.0000 mg | INTRAVENOUS | Status: AC
  Administered 2018-06-24: 1000 mg via INTRAVENOUS

## 2018-06-24 MED ORDER — CHLORHEXIDINE GLUCONATE 4 % EX LIQD
60.0000 mL | Freq: Once | CUTANEOUS | Status: AC
  Administered 2018-06-24: 4 via TOPICAL
  Filled 2018-06-24: qty 60

## 2018-06-24 MED ORDER — TRANEXAMIC ACID-NACL 1000-0.7 MG/100ML-% IV SOLN
INTRAVENOUS | Status: AC
  Filled 2018-06-24: qty 100

## 2018-06-24 MED ORDER — LIDOCAINE 2% (20 MG/ML) 5 ML SYRINGE
INTRAMUSCULAR | Status: DC | PRN
  Administered 2018-06-24: 60 mg via INTRAVENOUS

## 2018-06-24 MED ORDER — ONDANSETRON HCL 4 MG PO TABS: 4.0000 mg | ORAL_TABLET | Freq: Four times a day (QID) | ORAL | Status: DC | PRN

## 2018-06-24 MED ORDER — SODIUM CHLORIDE 0.9 % IV SOLN
10.0000 mL/h | Freq: Once | INTRAVENOUS | Status: AC
  Administered 2018-06-24: 14:00:00 via INTRAVENOUS

## 2018-06-24 MED ORDER — FENTANYL CITRATE (PF) 100 MCG/2ML IJ SOLN
INTRAMUSCULAR | Status: AC
  Filled 2018-06-24: qty 4

## 2018-06-24 MED ORDER — SODIUM CHLORIDE 0.9 % IV SOLN
INTRAVENOUS | Status: DC | PRN
  Administered 2018-06-24 (×2): 25 ug/min via INTRAVENOUS

## 2018-06-24 MED ORDER — POTASSIUM CHLORIDE CRYS ER 20 MEQ PO TBCR
40.0000 meq | EXTENDED_RELEASE_TABLET | Freq: Once | ORAL | Status: AC
  Administered 2018-06-24: 40 meq via ORAL
  Filled 2018-06-24: qty 2

## 2018-06-24 MED ORDER — MUSCLE RUB 10-15 % EX CREA
TOPICAL_CREAM | Freq: Two times a day (BID) | CUTANEOUS | Status: DC | PRN
  Administered 2018-06-24: 1 via TOPICAL
  Filled 2018-06-24: qty 85

## 2018-06-24 MED ORDER — ONDANSETRON HCL 4 MG/2ML IJ SOLN
INTRAMUSCULAR | Status: AC
  Filled 2018-06-24: qty 2

## 2018-06-24 MED ORDER — CEFAZOLIN SODIUM-DEXTROSE 2-3 GM-%(50ML) IV SOLR
INTRAVENOUS | Status: DC | PRN
  Administered 2018-06-24: 2 g via INTRAVENOUS

## 2018-06-24 MED ORDER — POVIDONE-IODINE 10 % EX SWAB: 2.0000 "application " | Freq: Once | CUTANEOUS | Status: DC

## 2018-06-24 MED ORDER — PHENOL 1.4 % MT LIQD: 1.0000 | OROMUCOSAL | Status: DC | PRN

## 2018-06-24 MED ORDER — MAGNESIUM CITRATE PO SOLN: 1.0000 | Freq: Once | ORAL | Status: DC | PRN

## 2018-06-24 MED ORDER — ONDANSETRON HCL 4 MG/2ML IJ SOLN
INTRAMUSCULAR | Status: DC | PRN
  Administered 2018-06-24: 4 mg via INTRAVENOUS

## 2018-06-24 MED ORDER — DEXAMETHASONE SODIUM PHOSPHATE 10 MG/ML IJ SOLN
10.0000 mg | Freq: Once | INTRAMUSCULAR | Status: AC
  Administered 2018-06-25: 10 mg via INTRAVENOUS
  Filled 2018-06-24: qty 1

## 2018-06-24 MED ORDER — DOCUSATE SODIUM 100 MG PO CAPS: 100.0000 mg | ORAL_CAPSULE | Freq: Two times a day (BID) | ORAL | Status: DC

## 2018-06-24 MED ORDER — DEXAMETHASONE SODIUM PHOSPHATE 10 MG/ML IJ SOLN
INTRAMUSCULAR | Status: DC | PRN
  Administered 2018-06-24: 10 mg via INTRAVENOUS

## 2018-06-24 MED ORDER — LACTATED RINGERS IV SOLN
INTRAVENOUS | Status: DC | PRN
  Administered 2018-06-24: 12:00:00 via INTRAVENOUS

## 2018-06-24 MED ORDER — ONDANSETRON HCL 4 MG/2ML IJ SOLN: 4.0000 mg | Freq: Once | INTRAMUSCULAR | Status: DC | PRN

## 2018-06-24 MED ORDER — ONDANSETRON HCL 4 MG/2ML IJ SOLN: 4.0000 mg | Freq: Four times a day (QID) | INTRAMUSCULAR | Status: DC | PRN

## 2018-06-24 MED ORDER — FENTANYL CITRATE (PF) 100 MCG/2ML IJ SOLN
25.0000 ug | INTRAMUSCULAR | Status: DC | PRN
  Administered 2018-06-24 (×4): 50 ug via INTRAVENOUS

## 2018-06-24 MED ORDER — FENTANYL CITRATE (PF) 100 MCG/2ML IJ SOLN
INTRAMUSCULAR | Status: DC | PRN
  Administered 2018-06-24 (×5): 50 ug via INTRAVENOUS

## 2018-06-24 MED ORDER — SODIUM CHLORIDE 0.9 % IR SOLN
Status: DC | PRN
  Administered 2018-06-24 (×2): 1000 mL

## 2018-06-24 MED ORDER — FENTANYL CITRATE (PF) 250 MCG/5ML IJ SOLN
INTRAMUSCULAR | Status: AC
  Filled 2018-06-24: qty 5

## 2018-06-24 MED ORDER — FERROUS SULFATE 325 (65 FE) MG PO TABS
325.0000 mg | ORAL_TABLET | Freq: Three times a day (TID) | ORAL | Status: DC
  Administered 2018-06-24 – 2018-06-28 (×11): 325 mg via ORAL
  Filled 2018-06-24 (×10): qty 1

## 2018-06-24 MED ORDER — DEXAMETHASONE SODIUM PHOSPHATE 10 MG/ML IJ SOLN
INTRAMUSCULAR | Status: AC
  Filled 2018-06-24: qty 1

## 2018-06-24 MED ORDER — SODIUM CHLORIDE 0.9 % IV SOLN
INTRAVENOUS | Status: DC
  Administered 2018-06-24: 17:00:00 via INTRAVENOUS

## 2018-06-24 MED ORDER — ALBUMIN HUMAN 5 % IV SOLN
INTRAVENOUS | Status: DC | PRN
  Administered 2018-06-24: 14:00:00 via INTRAVENOUS

## 2018-06-24 MED ORDER — TRANEXAMIC ACID-NACL 1000-0.7 MG/100ML-% IV SOLN
1000.0000 mg | Freq: Once | INTRAVENOUS | Status: AC
  Administered 2018-06-24: 1000 mg via INTRAVENOUS
  Filled 2018-06-24: qty 100

## 2018-06-24 MED ORDER — CEFAZOLIN SODIUM-DEXTROSE 2-4 GM/100ML-% IV SOLN
2.0000 g | Freq: Four times a day (QID) | INTRAVENOUS | Status: AC
  Administered 2018-06-24 – 2018-06-25 (×2): 2 g via INTRAVENOUS
  Filled 2018-06-24 (×2): qty 100

## 2018-06-24 MED ORDER — BUPIVACAINE HCL (PF) 0.25 % IJ SOLN
INTRAMUSCULAR | Status: AC
  Filled 2018-06-24: qty 30

## 2018-06-24 MED ORDER — PROPOFOL 10 MG/ML IV BOLUS
INTRAVENOUS | Status: DC | PRN
  Administered 2018-06-24: 150 mg via INTRAVENOUS

## 2018-06-24 MED ORDER — SUGAMMADEX SODIUM 200 MG/2ML IV SOLN
INTRAVENOUS | Status: DC | PRN
  Administered 2018-06-24: 200 mg via INTRAVENOUS

## 2018-06-24 MED ORDER — CEFAZOLIN SODIUM-DEXTROSE 2-4 GM/100ML-% IV SOLN
2.0000 g | INTRAVENOUS | Status: DC
  Filled 2018-06-24: qty 100

## 2018-06-24 MED ORDER — METOCLOPRAMIDE HCL 5 MG PO TABS: 5.0000 mg | ORAL_TABLET | Freq: Three times a day (TID) | ORAL | Status: DC | PRN

## 2018-06-24 MED ORDER — ROCURONIUM BROMIDE 100 MG/10ML IV SOLN
INTRAVENOUS | Status: AC
  Filled 2018-06-24: qty 1

## 2018-06-24 MED ORDER — ALUM & MAG HYDROXIDE-SIMETH 200-200-20 MG/5ML PO SUSP: 15.0000 mL | ORAL | Status: DC | PRN

## 2018-06-24 MED ORDER — PROPOFOL 10 MG/ML IV BOLUS
INTRAVENOUS | Status: AC
  Filled 2018-06-24: qty 20

## 2018-06-24 SURGICAL SUPPLY — 67 items
ARTICULEZE HEAD 36 12 (Hips) ×2 IMPLANT
BAG DECANTER FOR FLEXI CONT (MISCELLANEOUS) IMPLANT
BAG ZIPLOCK 12X15 (MISCELLANEOUS) ×2 IMPLANT
BLADE SAW SGTL 11.0X1.19X90.0M (BLADE) IMPLANT
BLADE SAW SGTL 18X1.27X75 (BLADE) ×2 IMPLANT
BNDG COHESIVE 4X5 TAN STRL (GAUZE/BANDAGES/DRESSINGS) ×2 IMPLANT
BRUSH FEMORAL CANAL (MISCELLANEOUS) IMPLANT
CHLORAPREP W/TINT 26ML (MISCELLANEOUS) ×2 IMPLANT
COVER SURGICAL LIGHT HANDLE (MISCELLANEOUS) ×2 IMPLANT
COVER WAND RF STERILE (DRAPES) IMPLANT
CUP ACETBLR 54 OD PINNACLE (Hips) ×2 IMPLANT
DERMABOND ADVANCED (GAUZE/BANDAGES/DRESSINGS) ×1
DERMABOND ADVANCED .7 DNX12 (GAUZE/BANDAGES/DRESSINGS) ×1 IMPLANT
DEROYAL ABDUCTION PILLOW HIP ×2 IMPLANT
DRAPE ORTHO SPLIT 77X108 STRL (DRAPES) ×2
DRAPE POUCH INSTRU U-SHP 10X18 (DRAPES) ×2 IMPLANT
DRAPE SURG 17X11 SM STRL (DRAPES) ×2 IMPLANT
DRAPE SURG ORHT 6 SPLT 77X108 (DRAPES) ×2 IMPLANT
DRAPE U-SHAPE 47X51 STRL (DRAPES) ×2 IMPLANT
DRESSING AQUACEL AG SP 3.5X10 (GAUZE/BANDAGES/DRESSINGS) IMPLANT
DRSG AQUACEL AG ADV 3.5X10 (GAUZE/BANDAGES/DRESSINGS) IMPLANT
DRSG AQUACEL AG ADV 3.5X14 (GAUZE/BANDAGES/DRESSINGS) ×2 IMPLANT
DRSG AQUACEL AG SP 3.5X10 (GAUZE/BANDAGES/DRESSINGS)
DURAPREP 26ML APPLICATOR (WOUND CARE) ×2 IMPLANT
ELECT BLADE TIP CTD 4 INCH (ELECTRODE) ×2 IMPLANT
ELECT REM PT RETURN 15FT ADLT (MISCELLANEOUS) ×2 IMPLANT
ELIMINATOR HOLE APEX DEPUY (Hips) ×2 IMPLANT
FACESHIELD WRAPAROUND (MASK) ×8 IMPLANT
GAUZE SPONGE 2X2 8PLY STRL LF (GAUZE/BANDAGES/DRESSINGS) IMPLANT
GLOVE BIOGEL M 7.0 STRL (GLOVE) IMPLANT
GLOVE BIOGEL PI IND STRL 7.5 (GLOVE) ×1 IMPLANT
GLOVE BIOGEL PI IND STRL 8.5 (GLOVE) ×1 IMPLANT
GLOVE BIOGEL PI INDICATOR 7.5 (GLOVE) ×1
GLOVE BIOGEL PI INDICATOR 8.5 (GLOVE) ×1
GLOVE ECLIPSE 8.0 STRL XLNG CF (GLOVE) ×4 IMPLANT
GOWN STRL REUS W/TWL 2XL LVL3 (GOWN DISPOSABLE) ×2 IMPLANT
GOWN STRL REUS W/TWL LRG LVL3 (GOWN DISPOSABLE) ×2 IMPLANT
HANDPIECE INTERPULSE COAX TIP (DISPOSABLE)
HEAD ARTICULEZE 36 12 (Hips) ×1 IMPLANT
LINER NEUTRAL 52X36X54 PLUS 4 (Liner) ×2 IMPLANT
MANIFOLD NEPTUNE II (INSTRUMENTS) ×2 IMPLANT
MARKER SKIN DUAL TIP RULER LAB (MISCELLANEOUS) IMPLANT
NDL SAFETY ECLIPSE 18X1.5 (NEEDLE) ×1 IMPLANT
NEEDLE HYPO 18GX1.5 SHARP (NEEDLE) ×1
NS IRRIG 1000ML POUR BTL (IV SOLUTION) ×4 IMPLANT
PADDING CAST COTTON 6X4 STRL (CAST SUPPLIES) IMPLANT
PILLOW ABDUCTION MEDIUM (MISCELLANEOUS) ×2 IMPLANT
PRESSURIZER FEMORAL UNIV (MISCELLANEOUS) IMPLANT
PROTECTOR NERVE ULNAR (MISCELLANEOUS) ×2 IMPLANT
SCREW 6.5MMX30MM (Screw) ×4 IMPLANT
SET HNDPC FAN SPRY TIP SCT (DISPOSABLE) IMPLANT
SPONGE GAUZE 2X2 STER 10/PKG (GAUZE/BANDAGES/DRESSINGS)
SPONGE LAP 18X18 RF (DISPOSABLE) IMPLANT
SPONGE LAP 4X18 RFD (DISPOSABLE) IMPLANT
STAPLER VISISTAT 35W (STAPLE) IMPLANT
SUCTION FRAZIER HANDLE 10FR (MISCELLANEOUS) ×1
SUCTION TUBE FRAZIER 10FR DISP (MISCELLANEOUS) ×1 IMPLANT
SUT STRATAFIX PDS+ 0 24IN (SUTURE) ×2 IMPLANT
SUT VIC AB 1 CT1 36 (SUTURE) ×2 IMPLANT
SUT VIC AB 2-0 CT1 27 (SUTURE) ×3
SUT VIC AB 2-0 CT1 TAPERPNT 27 (SUTURE) ×3 IMPLANT
TOWEL OR 17X26 10 PK STRL BLUE (TOWEL DISPOSABLE) ×6 IMPLANT
TOWER CARTRIDGE SMART MIX (DISPOSABLE) IMPLANT
TRAY FOLEY MTR SLVR 16FR STAT (SET/KITS/TRAYS/PACK) ×2 IMPLANT
TUBE KAMVAC SUCTION (TUBING) IMPLANT
WATER STERILE IRR 1000ML POUR (IV SOLUTION) ×4 IMPLANT
YANKAUER SUCT BULB TIP 10FT TU (MISCELLANEOUS) ×2 IMPLANT

## 2018-06-24 NOTE — Anesthesia Preprocedure Evaluation (Addendum)
Anesthesia Evaluation  Patient identified by MRN, date of birth, ID band Patient awake    Reviewed: Allergy & Precautions, NPO status , Patient's Chart, lab work & pertinent test results  Airway Mallampati: III  TM Distance: >3 FB Neck ROM: Full    Dental  (+) Edentulous Upper, Edentulous Lower   Pulmonary COPD,  COPD inhaler, former smoker,    Pulmonary exam normal breath sounds clear to auscultation       Cardiovascular hypertension, Pt. on medications + CAD, + Past MI, + CABG and +CHF  Normal cardiovascular exam+ dysrhythmias Atrial Fibrillation  Rhythm:Regular Rate:Normal  ECHO: 1. The left ventricle has hyperdynamic systolic function, with an ejection fraction of >65%. The cavity size was severely dilated. There is mildly increased left ventricular wall thickness. Left ventricular diastology could not be evaluated secondary to  atrial fibrillation. No evidence of left ventricular regional wall motion abnormalities.  2. The right ventricle was not well visualized. The cavity was mildly enlarged. There is not assessed.  3. Left atrial size was severely dilated.  4. Right atrial size was severely dilated.  5. The mitral valve is degenerative. Mild thickening of the mitral valve leaflet. Mitral valve regurgitation is mild to moderate by color flow Doppler.  6. The tricuspid valve is normal in structure.  7. The aortic valve is tricuspid Mild sclerosis of the aortic valve.  8. The aortic root and ascending aorta are normal in size and structure.  9. The inferior vena cava was dilated in size with <50% respiratory variability. 10. No evidence of left ventricular regional wall motion abnormalities. 11. Possible PFO  Sees cardiologist Stanford Breed)   Neuro/Psych negative neurological ROS  negative psych ROS   GI/Hepatic negative GI ROS, (+) Cirrhosis     substance abuse  ,   Endo/Other  negative endocrine ROS  Renal/GU Renal  InsufficiencyRenal disease     Musculoskeletal negative musculoskeletal ROS (+) narcotic dependent  Abdominal   Peds  Hematology  (+) anemia , HLD   Anesthesia Other Findings CONVERSION HEMI HIP TO TOTAL  Reproductive/Obstetrics                            Anesthesia Physical Anesthesia Plan  ASA: III  Anesthesia Plan: General   Post-op Pain Management:    Induction: Intravenous  PONV Risk Score and Plan: 2 and Dexamethasone, Ondansetron and Treatment may vary due to age or medical condition  Airway Management Planned: Oral ETT  Additional Equipment:   Intra-op Plan:   Post-operative Plan: Extubation in OR  Informed Consent: I have reviewed the patients History and Physical, chart, labs and discussed the procedure including the risks, benefits and alternatives for the proposed anesthesia with the patient or authorized representative who has indicated his/her understanding and acceptance.     Dental advisory given  Plan Discussed with: CRNA  Anesthesia Plan Comments:         Anesthesia Quick Evaluation

## 2018-06-24 NOTE — Transfer of Care (Signed)
Immediate Anesthesia Transfer of Care Note  Patient: [name]  Procedure(s) Performed: ACETABULAR HIP REVISION POSTERIOR (Left Hip)  Patient Location: PACU  Anesthesia Type:General  Level of Consciousness: awake, alert , oriented and patient cooperative  Airway & Oxygen Therapy: Patient Spontanous Breathing and Patient connected to face mask oxygen  Post-op Assessment: Report given to RN, Post -op Vital signs reviewed and stable and Patient moving all extremities X 4  Post vital signs: stable  Last Vitals:  Vitals Value Taken Time  BP 120/94 06/24/2018  2:45 PM  Temp    Pulse 61 06/24/2018  2:48 PM  Resp 12 06/24/2018  2:51 PM  SpO2 98 % 06/24/2018  2:48 PM  Vitals shown include unvalidated device data.  Last Pain:  Vitals:   06/24/18 1030  TempSrc:   PainSc: 7       Patients Stated Pain Goal: 3 (84/53/64 6803)  Complications: No apparent anesthesia complications

## 2018-06-24 NOTE — Progress Notes (Signed)
PT Cancellation Note  Patient Details Name: [name] MRN: [id number] DOB: [birth year]   Cancelled Treatment:    Reason Eval/Treat Not Completed: Patient not medically ready--scheduled for surgery today. Will await recommendations/reorder from surgeon. Thanks.    Weston Anna, PT Acute Rehabilitation Services Pager: [phone] Office: [phone]

## 2018-06-24 NOTE — Progress Notes (Signed)
Patient ID: [name], male   DOB: 1941-01-28, 78 y.o.   MRN: [id number] Subjective: Doing fairly well, no new events since admission and last reduction in ER of left hip hemiarthroplasty instability    Patient reports pain as mild.  Objective:   VITALS:   Vitals:   06/23/18 2123 06/24/18 0435  BP: (!) 152/58 135/63  Pulse: (!) 43 (!) 56  Resp: 16 14  Temp: 97.6 F (36.4 C) 98 F (36.7 C)  SpO2: 100% 95%    Neurovascular intact  LABS Recent Labs    06/22/18 1241 06/23/18 0408  HGB 10.2* 9.7*  HCT 34.0* 33.4*  WBC 7.2 6.5  PLT 244 209    Recent Labs    06/22/18 1241 06/23/18 0408 06/24/18 0637  NA 138 142 138  K 3.3* 3.5 3.6  BUN 34* 27* 26*  CREATININE 1.34* 1.25* 1.28*  GLUCOSE 132* 115* 100*    No results for input(s): LABPT, INR in the last 72 hours.   Assessment/Plan: Left hip hemiarthroplasty recurrent instability after fall   Plan: To OR today for conversion to THR, LEFT NPO Consent ordered Plan and goals reviewed  Wants to go home after procedure.  Likely 2-3 day post op to make certain safe for home discharge due to prior instability concerns and to prevent ER bounce back Maybe HHPT

## 2018-06-24 NOTE — Anesthesia Postprocedure Evaluation (Signed)
Anesthesia Post Note  Patient: [name]  Procedure(s) Performed: ACETABULAR HIP REVISION POSTERIOR (Left Hip)     Patient location during evaluation: PACU Anesthesia Type: General Level of consciousness: awake and alert Pain management: pain level controlled Vital Signs Assessment: post-procedure vital signs reviewed and stable Respiratory status: spontaneous breathing, nonlabored ventilation, respiratory function stable and patient connected to nasal cannula oxygen Cardiovascular status: blood pressure returned to baseline and stable Postop Assessment: no apparent nausea or vomiting Anesthetic complications: no    Last Vitals:  Vitals:   06/24/18 1553 06/24/18 1610  BP: (!) 132/58 (!) 161/53  Pulse: 64 64  Resp: 14 15  Temp: 36.5 C   SpO2: 100% 100%    Last Pain:  Vitals:   06/24/18 1500  TempSrc:   PainSc: Asleep                 Shyanna Klingel P Shante [place]

## 2018-06-24 NOTE — Progress Notes (Addendum)
PROGRESS NOTE  [name] UEA:[id number] DOB: October 04, 1940 DOA: 06/22/2018 PCP: [name], MD  HPI/Recap of past 24 hours: [name] is a 78 y.o. male with Past medical history of AAA, CAD S/P CABG, carotid stenosis, CVA, chronic diastolic CHF, cirrhosis of the liver, COPD, HTN, HLD, Mobitz type I AV block. The patient presents with complaints of hip pain. Underwent left hip arthroplasty October 2019.  After that patient was seen in the ER hip on 06/12/2018.  Patient was earlier seen in the morning of 06/22/2018 for hip pain and was found to have dislocation of the hip which was corrected and patient was sent home. Patient comes to the hospital again with dislocation of the left hip with hip pain. Given conscious sedation and reduction was performed in the ER. Patient denies having any trauma, fall, injury.  No fever no chills.  No chest pain abdominal pain at the time of my evaluation. Patient does have chronic back pain as well as shoulder pain. No diarrhea reported patient actually has constipation. No recent change in medications reported as well. Patient tells me that when he saw his primary cardiologist he was informed that he may require a pacemaker in the near future.  Patient reports compliant with all his medication including pain medication.  No alcohol or drug abuse reported by the patient as well.  Patient's hip was reduced in the ER under conscious sedation.  EDP discussed with Dr. Alvan Dame who recommends the patient to be admitted in the hospital.  No further plan available from orthopedic providers.  At his baseline ambulates with assistance And is independent for most of his ADL; manages his medication on his own.  06/23/18: Patient seen and examined at bedside.  No acute events overnight.  Reports pain in his left knee.  Lidocaine patch ordered.  A. fib with slow ventricular response noted on telemetry this morning with history of second-degree AV block Mobitz  type I.  Cardiology consulted.  Followed by Dr. Stanford Breed and Dr. Caryl Comes.   06/24/18: Patient seen and examined at bedside.  No acute events overnight.  Denies any chest pain or dyspnea at rest.  Mild left hip pain.  Plan for left THR today.  Assessment/Plan: Principal Problem:   Failed total hip arthroplasty with dislocation, initial encounter [facility]) Active Problems:   Hyperlipidemia   Essential hypertension   COPD (chronic obstructive pulmonary disease) (HCC)   Abdominal aortic aneurysm (HCC)   Chronic back pain   Cirrhosis (HCC)   Bladder outlet obstruction   Second degree AV block, Mobitz type I   Aortic stenosis   Opiate dependence (HCC)   Chronic diastolic heart failure (HCC)   CKD (chronic kidney disease) stage 3, GFR 30-59 ml/min (HCC)  Recurrent left hip dislocation post hemiarthroplasty Self-reported dislocation x3 post hemiarthroplasty Orthopedic surgery consulted and following Plan for left total hip replacement on 06/24/2018 Cleared by cardiology for surgery Pain management in place  Second-degree AV block Mobitz type I Followed outpatient with Dr. Caryl Comes Cardiology consulted May require pacemaker Cardiology following Can transfer to [facility] if needed  Recurrent sinus pauses Asymptomatic 2.3 sec pause reported this pm Continue to closely monitor and update cardiology  Paroxysmal A. fib with slow ventricular response Chads Vascor 5 Defer to cardiology to start anticoagulation post surgery Surgical intervention planned today left total hip replacement 07/26/8293  Chronic diastolic CHF 2D echio done on 06/23/18 revealed EF>65% Afib on 2D echo Net I&O -3.3L since admission  Severely  dialated biatrial w possible PFO/Mild to moderate MR Afib on echo Defer to cardiology to start anticoagulation  Coronary artery disease status post CABG Denies anginal symptoms Continue aspirin and statin  Chronic diastolic CHF Last 2D echo done on 2017 revealed LVEF 65  to 70% with grade 1 diastolic dysfunction Euvolemic No sign of acute exacerbation  COPD Continue as needed albuterol nebs  Cirrhosis Appears compensated Continue Lasix, spironolactone, lactulose  AAA Asymptomatic 3 x 3.2 cm of on eighth 27 2019 Continue surveillance outpatient  Hypertension  - Continue hydralazine as tolerated  CKD stage III -SCr is 1.3 on admission -Creatinine 1.28 with GFR 54 on 06/24/2018  BPH Stable On tamsulosin and finasteride Monitor urine output   Nutrition: Cardiac and carb modified diet  DVT Prophylaxis: subcutaneous Heparin  Advance goals of care discussion: full code   Consults: orthopedics, cardiology   Family Communication:  None at bedside    Objective: Vitals:   06/24/18 1445 06/24/18 1500 06/24/18 1515 06/24/18 1530  BP: (!) 120/94 136/74 (!) 150/55 (!) 151/60  Pulse: 62 65 62 66  Resp: 15 13 11 17   Temp:      TempSrc:      SpO2: 100% 100% 100% 100%  Weight:      Height:        Intake/Output Summary (Last 24 hours) at 06/24/2018 1543 Last data filed at 06/24/2018 1429 Gross per 24 hour  Intake 2430 ml  Output 2575 ml  Net -145 ml   Filed Weights   06/22/18 1750  Weight: 74.8 kg    Exam:  . General: 78 y.o. year-old male well developed well-nourished in no acute distress.  Alert and oriented x3. . Cardiovascular: Regular rate and rhythm with no rubs or gallops.  No JVD or thyromegaly. [name] Respiratory: Clear to auscultation with no wheezes or rales.  Good inspiratory effort. . Abdomen: Soft nontender nondistended with normal bowel sounds x4 quadrants. . Musculoskeletal: 1+ lower extremity edema. 2/4 pulses in all 4 extremities. LLE in surgical binder. [name] Psychiatry: Mood is appropriate for condition and setting   Data Reviewed: CBC: Recent Labs  Lab 06/22/18 1241 06/23/18 0408 06/24/18 1359  WBC 7.2 6.5  --   NEUTROABS 5.7 4.6  --   HGB 10.2* 9.7* 9.9*  HCT 34.0* 33.4* 29.0*  MCV 87.6 89.1  --    PLT 244 209  --    Basic Metabolic Panel: Recent Labs  Lab 06/22/18 1241 06/23/18 0408 06/24/18 0637 06/24/18 1359  NA 138 142 138 138  K 3.3* 3.5 3.6 4.0  CL 107 112* 107  --   CO2 24 24 25   --   GLUCOSE 132* 115* 100* 107*  BUN 34* 27* 26*  --   CREATININE 1.34* 1.25* 1.28*  --   CALCIUM 8.4* 8.2* 8.6*  --   MG  --  2.0  --   --    GFR: Estimated Creatinine Clearance: 52.8 mL/min (A) (by C-G formula based on SCr of 1.28 mg/dL (H)). Liver Function Tests: Recent Labs  Lab 06/23/18 0408  AST 25  ALT 24  ALKPHOS 161*  BILITOT 0.8  PROT 6.3*  ALBUMIN 2.9*   No results for input(s): LIPASE, AMYLASE in the last 168 hours. No results for input(s): AMMONIA in the last 168 hours. Coagulation Profile: No results for input(s): INR, PROTIME in the last 168 hours. Cardiac Enzymes: No results for input(s): CKTOTAL, CKMB, CKMBINDEX, TROPONINI in the last 168 hours. BNP (last 3 results) No results  for input(s): PROBNP in the last 8760 hours. HbA1C: No results for input(s): HGBA1C in the last 72 hours. CBG: No results for input(s): GLUCAP in the last 168 hours. Lipid Profile: No results for input(s): CHOL, HDL, LDLCALC, TRIG, CHOLHDL, LDLDIRECT in the last 72 hours. Thyroid Function Tests: No results for input(s): TSH, T4TOTAL, FREET4, T3FREE, THYROIDAB in the last 72 hours. Anemia Panel: No results for input(s): VITAMINB12, FOLATE, FERRITIN, TIBC, IRON, RETICCTPCT in the last 72 hours. Urine analysis:    Component Value Date/Time   COLORURINE YELLOW 01/21/2017 0450   APPEARANCEUR CLEAR 01/21/2017 0450   LABSPEC 1.011 01/21/2017 0450   PHURINE 5.0 01/21/2017 0450   GLUCOSEU NEGATIVE 01/21/2017 0450   HGBUR NEGATIVE 01/21/2017 0450   BILIRUBINUR 3+ 09/16/2017 1617   KETONESUR NEGATIVE 01/21/2017 0450   PROTEINUR Positive (A) 09/16/2017 1617   PROTEINUR NEGATIVE 01/21/2017 0450   UROBILINOGEN 2.0 (A) 09/16/2017 1617   UROBILINOGEN 1.0 07/28/2013 2005   NITRITE pos  09/16/2017 1617   NITRITE NEGATIVE 01/21/2017 0450   LEUKOCYTESUR Large (3+) (A) 09/16/2017 1617   Sepsis Labs: @LABRCNTIP (procalcitonin:4,lacticidven:4)  ) Recent Results (from the past 240 hour(s))  Surgical pcr screen     Status: None   Collection Time: 06/24/18  7:06 AM  Result Value Ref Range Status   MRSA, PCR NEGATIVE NEGATIVE Final   Staphylococcus aureus NEGATIVE NEGATIVE Final    Comment: (NOTE) The Xpert SA Assay (FDA approved for NASAL specimens in patients 56 years of age and older), is one component of a comprehensive surveillance program. It is not intended to diagnose infection nor to guide or monitor treatment. Performed at [facility], [place] [address] [place], [place] [address]       Studies: Dg Pelvis Portable  Result Date: 06/24/2018 CLINICAL DATA:  Left acetabular revision EXAM: PORTABLE PELVIS 1-2 VIEWS COMPARISON:  06/22/2018 FINDINGS: Pubic symphysis and rami are intact. Interval revision of left hip replacement with normal alignment and no fracture. Gas in the soft tissues consistent with recent surgery IMPRESSION: Left hip replacement with expected surgical changes Electronically Signed   By: Donavan Foil M.D.   On: 06/24/2018 15:38    Scheduled Meds: . [MAR Hold] aspirin  81 mg Oral Daily  . [MAR Hold] docusate sodium  100 mg Oral BID  . fentaNYL      . [MAR Hold] finasteride  5 mg Oral Daily  . [MAR Hold] fluticasone  1 spray Each Nare Daily  . [MAR Hold] furosemide  80 mg Oral Daily  . [MAR Hold] heparin  5,000 Units Subcutaneous Q8H  . [MAR Hold] lidocaine  2 patch Transdermal Q24H  . [MAR Hold] polyethylene glycol  17 g Oral Daily  . povidone-iodine  2 application Topical Once  . [MAR Hold] spironolactone  25 mg Oral Daily  . [MAR Hold] tamsulosin  0.4 mg Oral Daily    Continuous Infusions: .  ceFAZolin (ANCEF) IV    . [MAR Hold] methocarbamol (ROBAXIN) IV    . tranexamic acid       LOS: 0 days     Kayleen Memos, MD [facility] Pager [phone]  If 7PM-7AM, please contact night-coverage [email or web address] Password [facility] 06/24/2018, 3:43 PM

## 2018-06-24 NOTE — Progress Notes (Signed)
Progress Note  Patient Name: [name] Date of Encounter: 06/24/2018  Primary Cardiologist: Kirk Ruths, MD   Subjective   No significant overnight events. Per RN note, there were multiple calls from Central Telemetry yesterday regarding patient's heart rate dropping down into the upper 20's but not sustaining there. Patient completely asymptomatic with this and his often "dozing" during these times. He arouses easily and heart rate rises to the 40's to 50's.   Patient continues to deny any cardiac symptoms including chest pain, shortness of breath, palpitations, lightheadedness, or dizziness. He is having some right leg pain but no significant pain on the left which is the side with the hip dislocation.   Patient is scheduled for revised hip surgery later today.   Inpatient Medications    Scheduled Meds: . aspirin  81 mg Oral Daily  . chlorhexidine  60 mL Topical Once  . docusate sodium  100 mg Oral BID  . finasteride  5 mg Oral Daily  . fluticasone  1 spray Each Nare Daily  . furosemide  80 mg Oral Daily  . heparin  5,000 Units Subcutaneous Q8H  . lidocaine  2 patch Transdermal Q24H  . polyethylene glycol  17 g Oral Daily  . povidone-iodine  2 application Topical Once  . spironolactone  25 mg Oral Daily  . tamsulosin  0.4 mg Oral Daily   Continuous Infusions: .  ceFAZolin (ANCEF) IV    . methocarbamol (ROBAXIN) IV     PRN Meds: bisacodyl, lactulose, methocarbamol **OR** methocarbamol (ROBAXIN) IV, MUSCLE RUB, oxyCODONE, sertraline   Vital Signs    Vitals:   06/23/18 1117 06/23/18 1348 06/23/18 2123 06/24/18 0435  BP: 129/61 (!) 109/49 (!) 152/58 135/63  Pulse: (!) 54 (!) 44 (!) 43 (!) 56  Resp: 12 15 16 14   Temp: 98 F (36.7 C) 98.2 F (36.8 C) 97.6 F (36.4 C) 98 F (36.7 C)  TempSrc: Oral Oral Oral Oral  SpO2: 97% 96% 100% 95%  Weight:      Height:        Intake/Output Summary (Last 24 hours) at 06/24/2018 0718 Last data filed at 06/24/2018  0600 Gross per 24 hour  Intake 960 ml  Output 3025 ml  Net -2065 ml   Last 3 Weights 06/22/2018 06/22/2018 06/12/2018  Weight (lbs) 165 lb 165 lb 165 lb  Weight (kg) 74.844 kg 74.844 kg 74.844 kg      Telemetry    Tremulous baseline that mask P waves making it difficult to interpret:  Atrial fibrillation vs. Atrial flutter vs. Wenckebach. Rates in the 30's to 60's with multiple pause, longest one being 2.7 seconds. Ventricular rhythm noted at times but does not last long. - Personally Reviewed  ECG    No new ECG tracing today. - Personally Reviewed  Physical Exam   GEN: Caucasian male resting comfortably. Alert and in no acute distress.   Neck: Supple. Cardiac: Bradycardic with irregular rhythm. II/VI systolic murmur. No rubs or gallops.  Respiratory: Clear to auscultation bilaterally. GI: Abdomen soft, non-distended, and non-tender. Bowel sounds present. MS: Trace lower extremity edema. Brace on right leg. Radial pulses 2+ and equal bilaterally. Skin: Warm and dry.  Neuro: Alert and oriented x3. No focal deficits. Psych: Normal affect. Responds appropriately.   Labs    Chemistry Recent Labs  Lab 06/22/18 1241 06/23/18 0408  NA 138 142  K 3.3* 3.5  CL 107 112*  CO2 24 24  GLUCOSE 132* 115*  BUN 34* 27*  CREATININE 1.34* 1.25*  CALCIUM 8.4* 8.2*  PROT  --  6.3*  ALBUMIN  --  2.9*  AST  --  25  ALT  --  24  ALKPHOS  --  161*  BILITOT  --  0.8  GFRNONAA 51* 56*  GFRAA 60* >60  ANIONGAP 7 6     Hematology Recent Labs  Lab 06/22/18 1241 06/23/18 0408  WBC 7.2 6.5  RBC 3.88* 3.75*  HGB 10.2* 9.7*  HCT 34.0* 33.4*  MCV 87.6 89.1  MCH 26.3 25.9*  MCHC 30.0 29.0*  RDW 16.6* 16.7*  PLT 244 209    Cardiac EnzymesNo results for input(s): TROPONINI in the last 168 hours. No results for input(s): TROPIPOC in the last 168 hours.   BNPNo results for input(s): BNP, PROBNP in the last 168 hours.   DDimer No results for input(s): DDIMER in the last 168 hours.    Radiology    Dg Pelvis 1-2 Views  Result Date: 06/22/2018 CLINICAL DATA:  Fall 1 week ago with hip dislocation EXAM: PELVIS - 1-2 VIEW COMPARISON:  None. FINDINGS: There has been dislocation of the femoral prosthesis from the acetabulum. No definitive fracture is seen. Degenerative changes of lumbar spine are noted. No soft tissue abnormality is seen. IMPRESSION: Dislocation of the femoral prosthesis from the acetabulum. Electronically Signed   By: Inez Catalina M.D.   On: 06/22/2018 14:57   Chest Portable 1 View  Result Date: 06/22/2018 CLINICAL DATA:  Failed total hip arthroplasty with dislocation, initial encounter. Preop cardiovascular exam. EXAM: PORTABLE CHEST 1 VIEW COMPARISON:  Chest radiograph 01/26/2018 FINDINGS: Post median sternotomy. Unchanged heart size and mediastinal contours with borderline cardiomegaly. No focal airspace disease, pulmonary edema, pleural effusion or pneumothorax. No acute osseous abnormalities are seen. IMPRESSION: No acute chest findings. Electronically Signed   By: Keith Rake M.D.   On: 06/22/2018 19:13   Dg Hip Port Unilat W Or Wo Pelvis 1 View Left  Result Date: 06/22/2018 CLINICAL DATA:  Postreduction. EXAM: DG HIP (WITH OR WITHOUT PELVIS) 1V PORT LEFT COMPARISON:  LEFT hip radiograph June 22, 2018 at 0414 hours. FINDINGS: Status post LEFT hip hemiarthroplasty with intact well-seated hardware. No periprosthetic lucency. No fracture deformity or dislocation by single frontal radiograph. Soft tissue planes are non suspicious. IMPRESSION: Status post LEFT hip hemiarthroplasty without fracture deformity or dislocation by single view. Electronically Signed   By: Elon Alas M.D.   On: 06/22/2018 15:52    Cardiac Studies   Echocardiogram 06/23/2018:  1. The left ventricle has hyperdynamic systolic function, with an ejection fraction of >65%. The cavity size was severely dilated. There is mildly increased left ventricular wall thickness. Left ventricular  diastology could not be evaluated secondary to  atrial fibrillation. No evidence of left ventricular regional wall motion abnormalities.  2. The right ventricle was not well visualized. The cavity was mildly enlarged. There is not assessed.  3. Left atrial size was severely dilated.  4. Right atrial size was severely dilated.  5. The mitral valve is degenerative. Mild thickening of the mitral valve leaflet. Mitral valve regurgitation is mild to moderate by color flow Doppler.  6. The tricuspid valve is normal in structure.  7. The aortic valve is tricuspid Mild sclerosis of the aortic valve.  8. The aortic root and ascending aorta are normal in size and structure.  9. The inferior vena cava was dilated in size with <50% respiratory variability. 10. No evidence of left ventricular regional wall motion abnormalities. 11.  Possible PFO.  Summary: LVEF 65-70%, mild LVH, normal wall motion, severe biatrail enlargement, mild to moderate MR, mild TR, RVSP 46 mmHg, dilated IVC that does not collapse, cannot exclude PFO.  Patient Profile   [name] is a 78 y.o. male with a history of CAD with NSTEMI s/p CABG in 10/2008, AAA, carotid stenosis, atrial flutter s/p ablation in 2016, mobitz type 1 second degree AV blocker (Wenckebach), chronic diastolic CHF, hypertension, hyperlipidemia, who was admitted on 06/22/2018 following recurrent left hip dislocations with plans for a revised left hip hemiarthroplasty to total hip replacement during this hospital stay. Cardiology was asked to evaluate patient given chronic bradycardia and history of Wenckebach.   Assessment & Plan    Possible Atrial Fibrillation with SVR / Wenckebach / History of Atrial Flutter s/p Ablation in 2016 - Patient continues to have tremulous baseline which masks P waves. Hard for me to truly assess whether this is atrial fibrillation, atrial flutter, or Wenkebach. Does seem to have flutter waves at times. Will review with MD.  - Echo  showed LVEF of  >65%. LV was severely dilated but no regional small motion abnormalities. Left atrium and right atrium were also severely dilated. Possible PFO also noted. - Avoid any AV nodal agents. - CHA2DS-VASc = 5 (CHF, CAD, HTN, age x2). If truly atrial fibrillation or atrial flutter, will need long-term anticoagulation. Patient is scheduled for hip surgery later today.   Pre-Operative Evaluation - Patient admitted for recurrent hip dislocation.  - His hip limits his activity right now, but prior to hip fracture and recurrent dislocations he was able to do >4 mets without any issues. He denies any chest pain, shortness of breath, palpitations, lightheadedness or dizziness. - Echo this admission showed LVEF of >65 with no regional small motion abnormalities. PFO could not exclude PFO. - Revised left hip hemiarthroplasty vs total hip replacement total hip surgery planned for today. Patient had similar surgery in 01/2018 and did well. There are no active cardiovascular issues that would preclude surgery. His heart rate would need to be monitored on telemetry peripoeratively, and all AV nodal agents should be avoided.   CAD s/p CABG in 2010 - Patient denies any chest pain.  - Continue Aspirin and statin.  Chronic Diastolic CHF - Echo showed LVEF of >65%. - Patient does not appear significantly volume overloaded.  - OK to continue home PO Lasix and Spironolactone. Renal function stable.  Aortic Stenosis - Very mild aortic stenosis noted on Echo in 2017 with mean gradient of 11 mmHg and peak gradient of 20 mmHg but no evidence of aortic stenosis on Echo this admission.   AAA - Last abdominal ultrasound in 11/2017 showed distal AAA with the largest aortic measurement being 3.0cm, essential unchanged from prior imaging.  - Will need annual abdominal ultrasound in 11/2018.  Carotid Artery Stenosis - Most recent carotid dopplers showed 1-30% stenosis of left ICA, 50-59% stenosis of right ICA,  right ECA >50% stenosis, and non-hemodynamically significant plaque <50% noted in the CCA. - Repeat dopplers recommended in 11/2018.   For questions or updates, please contact [place] Please consult [email or web address] for contact info under        Signed, Darreld Mclean, PA-C  06/24/2018, 7:18 AM

## 2018-06-24 NOTE — Brief Op Note (Signed)
06/22/2018 - 06/24/2018  1:00 PM  PATIENT:  [name]  78 y.o. male  PRE-OPERATIVE DIAGNOSIS:  Failed left hip hemiarthroplasty due to recent onset instability after fall  POST-OPERATIVE DIAGNOSIS:  Failed left hip hemiarthroplasty due to recent onset instability after fallL  PROCEDURE:  Procedure(s): ACETABULAR HIP REVISION POSTERIOR (Left)  SURGEON:  Surgeon(s) and Role:    [name], MD - Primary  PHYSICIAN ASSISTANT: Danae Orleans, PA-C  ANESTHESIA:   general  EBL:  300 cc   BLOOD ADMINISTERED:none  DRAINS: none   LOCAL MEDICATIONS USED:  NONE  SPECIMEN:  No Specimen  DISPOSITION OF SPECIMEN:  N/A  COUNTS:  YES  TOURNIQUET:  * No tourniquets in log *  DICTATION: .Other Dictation: Dictation Number [id number]  PLAN OF CARE: Admit to inpatient   PATIENT DISPOSITION:  PACU - hemodynamically stable.   Delay start of Pharmacological VTE agent (>24hrs) due to surgical blood loss or risk of bleeding: no

## 2018-06-24 NOTE — Progress Notes (Signed)
Alerted by tele that patient had a 2.3 sec pause.  Patient asymptomatic and VSS.  Will continue to monitor

## 2018-06-25 ENCOUNTER — Encounter (HOSPITAL_COMMUNITY): Payer: Self-pay | Admitting: Orthopedic Surgery

## 2018-06-25 DIAGNOSIS — I480 Paroxysmal atrial fibrillation: Secondary | ICD-10-CM

## 2018-06-25 LAB — CBC
HCT: 31.9 % — ABNORMAL LOW (ref 39.0–52.0)
Hemoglobin: 9.7 g/dL — ABNORMAL LOW (ref 13.0–17.0)
MCH: 26.6 pg (ref 26.0–34.0)
MCHC: 30.4 g/dL (ref 30.0–36.0)
MCV: 87.6 fL (ref 80.0–100.0)
Platelets: 179 10*3/uL (ref 150–400)
RBC: 3.64 MIL/uL — ABNORMAL LOW (ref 4.22–5.81)
RDW: 16.4 % — ABNORMAL HIGH (ref 11.5–15.5)
WBC: 8.3 10*3/uL (ref 4.0–10.5)
nRBC: 0 % (ref 0.0–0.2)

## 2018-06-25 LAB — BASIC METABOLIC PANEL
Anion gap: 6 (ref 5–15)
BUN: 30 mg/dL — ABNORMAL HIGH (ref 8–23)
CO2: 26 mmol/L (ref 22–32)
Calcium: 8.1 mg/dL — ABNORMAL LOW (ref 8.9–10.3)
Chloride: 104 mmol/L (ref 98–111)
Creatinine, Ser: 1.36 mg/dL — ABNORMAL HIGH (ref 0.61–1.24)
GFR calc Af Amer: 59 mL/min — ABNORMAL LOW (ref >60)
GFR calc non Af Amer: 51 mL/min — ABNORMAL LOW (ref >60)
Glucose, Bld: 158 mg/dL — ABNORMAL HIGH (ref 70–99)
Potassium: 4.2 mmol/L (ref 3.5–5.1)
Sodium: 136 mmol/L (ref 135–145)

## 2018-06-25 MED ORDER — METHOCARBAMOL 500 MG PO TABS: 500.0000 mg | ORAL_TABLET | Freq: Four times a day (QID) | ORAL | 0 refills | Status: DC | PRN

## 2018-06-25 MED ORDER — ASPIRIN 81 MG PO CHEW: 81.0000 mg | CHEWABLE_TABLET | Freq: Two times a day (BID) | ORAL | 0 refills | Status: AC

## 2018-06-25 MED ORDER — OXYCODONE HCL 30 MG PO TABS: 30.0000 mg | ORAL_TABLET | Freq: Every day | ORAL | 0 refills | Status: DC | PRN

## 2018-06-25 NOTE — Evaluation (Signed)
Physical Therapy Evaluation Patient Details Name: [name] MRN: [id number] DOB: [birth year] Today's Date: 06/25/2018   History of Present Illness  78 yo male admitted with L dislocated hip. S/P L acetabular hip revision 06/24/18. Hx of CP-lift in shoe for leg length discrepancy, COPD, CHF, CABG, falls.  Clinical Impression  On eval, pt required Min assist +2 for mobility. He walked ~60 feet with a RW. Mild-Mod pain with activity. Pt is very motivated to regain independence and return home. Reviewed hip precautions throughout session. Pt prefers  d/c home instead of ST SNF rehab. Will continue to follow and progress activity as tolerated.     Follow Up Recommendations SNF vs Home health PT(depending on progress. Pt prefers to d/c home. )    Equipment Recommendations  None recommended by PT    Recommendations for Other Services       Precautions / Restrictions Precautions Precautions: Posterior Hip;Fall Precaution Booklet Issued: Yes (comment) Precaution Comments: Abduction Pillow in bed/at rest. Pt able to recall 2/3 hip precautions. Reviewed all hip precautions and WBAT status. Restrictions Weight Bearing Restrictions: No LLE Weight Bearing: Weight bearing as tolerated      Mobility  Bed Mobility Overal bed mobility: Needs Assistance Bed Mobility: Supine to Sit     Supine to sit: Min assist;+2 for safety/equipment;+2 for physical assistance;HOB elevated     General bed mobility comments: Assist for L LE/maintaining precautions and to scoot to EOB. Increased time. VCs safety, technique, hand placement. Pt relied on bedrail.   Transfers Overall transfer level: Needs assistance Equipment used: Rolling walker (2 wheeled) Transfers: Sit to/from Stand Sit to Stand: From elevated surface;Min assist;+2 safety/equipment         General transfer comment: VCs safety, techique, hand/LE placement. Assist to rise, stabilize, control descent.   Ambulation/Gait Ambulation/Gait  assistance: Min assist;+2 safety/equipment Gait Distance (Feet): 60 Feet Assistive device: Rolling walker (2 wheeled) Gait Pattern/deviations: Step-to pattern;Trunk flexed     General Gait Details: VCs safety, techique, sequence, distance from RW, proper use of RW. Assist to stabilize throughout distance. Followed with recliner for safety. Pt denied lightheadedness/dizziness.   Stairs            Wheelchair Mobility    Modified Rankin (Stroke Patients Only)       Balance Overall balance assessment: Needs assistance         Standing balance support: Bilateral upper extremity supported Standing balance-Leahy Scale: Poor                               Pertinent Vitals/Pain Pain Assessment: Faces Faces Pain Scale: Hurts even more Pain Location: L hip Pain Descriptors / Indicators: Discomfort;Sore Pain Intervention(s): Monitored during session;Repositioned    Home Living Family/patient expects to be discharged to:: Private residence Living Arrangements: Alone Available Help at Discharge: Available PRN/intermittently;Family Type of Home: Mobile home Home Access: Stairs to enter Entrance Stairs-Rails: Can reach both Entrance Stairs-Number of Steps: 3 Home Layout: One level Home Equipment: Walker - 4 wheels      Prior Function Level of Independence: Independent with assistive device(s)               Hand Dominance        Extremity/Trunk Assessment   Upper Extremity Assessment Upper Extremity Assessment: Defer to OT evaluation    Lower Extremity Assessment Lower Extremity Assessment: Generalized weakness;LLE deficits/detail LLE Deficits / Details: leg length discrepancy    Cervical /  Trunk Assessment Cervical / Trunk Assessment: Kyphotic  Communication   Communication: No difficulties  Cognition Arousal/Alertness: Awake/alert Behavior During Therapy: WFL for tasks assessed/performed Overall Cognitive Status: Within Functional Limits  for tasks assessed                                        General Comments      Exercises     Assessment/Plan    PT Assessment Patient needs continued PT services  PT Problem List Decreased strength;Decreased range of motion;Decreased mobility;Decreased balance;Pain;Decreased knowledge of use of DME;Decreased activity tolerance;Decreased knowledge of precautions       PT Treatment Interventions DME instruction;Gait training;Stair training;Functional mobility training;Therapeutic activities;Therapeutic exercise;Balance training;Patient/family education    PT Goals (Current goals can be found in the Care Plan section)  Acute Rehab PT Goals Patient Stated Goal: home PT Goal Formulation: With patient Time For Goal Achievement: 07/09/18 Potential to Achieve Goals: Good    Frequency Min 5X/week   Barriers to discharge        Co-evaluation               AM-PAC PT "6 Clicks" Mobility  Outcome Measure Help needed turning from your back to your side while in a flat bed without using bedrails?: A Little Help needed moving from lying on your back to sitting on the side of a flat bed without using bedrails?: A Lot Help needed moving to and from a bed to a chair (including a wheelchair)?: A Little Help needed standing up from a chair using your arms (e.g., wheelchair or bedside chair)?: A Little Help needed to walk in hospital room?: A Little Help needed climbing 3-5 steps with a railing? : A Lot 6 Click Score: 16    End of Session Equipment Utilized During Treatment: Gait belt Activity Tolerance: Patient tolerated treatment well Patient left: in chair;with call bell/phone within reach;with chair alarm set(towel roll betweeen knees for abduction reminder)   PT Visit Diagnosis: Unsteadiness on feet (R26.81);Repeated falls (R29.6);History of falling (Z91.81);Other abnormalities of gait and mobility (R26.89);Muscle weakness (generalized) (M62.81);Difficulty in  walking, not elsewhere classified (R26.2);Pain Pain - Right/Left: Left Pain - part of body: Hip    Time: 6381-7711 PT Time Calculation (min) (ACUTE ONLY): 23 min   Charges:   PT Evaluation $PT Eval Low Complexity: 1 Low PT Treatments $Gait Training: 8-22 mins         Weston Anna, PT Acute Rehabilitation Services Pager: [phone] Office: [phone]

## 2018-06-25 NOTE — Progress Notes (Signed)
Patient up to BR toilet to attempt voiding again. This time was able to void 900 cc.  Bladder scan done and now showing 0 cc.  No need for I&O cath at this time.

## 2018-06-25 NOTE — Progress Notes (Signed)
Patient voided this afternoon at 1445 into toilet while also having a BM.  Unable to measure urine OP.  Bladder scan at 1515 still showing approx 350-400 cc still in bladder.  Patient requested to not have I&O cath done at this time, asked if he could finish paying bills over the phone and then would attempt voiding again.  Patient up at bedside to void again at 1645.  Patient able to pass 225 cc independently.  Bladder scan now showing approx 340 cc left in bladder.  Patient denies discomfort.  Will continue to monitor at this time as amount not >400cc per post removal algorithm.

## 2018-06-25 NOTE — Progress Notes (Signed)
PROGRESS NOTE  [name] NUU:[id number] DOB: 1940-07-25 DOA: 06/22/2018 PCP: [name], MD  HPI/Recap of past 24 hours: [name] is a 78 y.o. male with Past medical history of AAA, CAD S/P CABG, carotid stenosis, CVA, chronic diastolic CHF, cirrhosis of the liver, COPD, HTN, HLD, Mobitz type I AV block. The patient presents with complaints of hip pain. Underwent left hip arthroplasty October 2019.  After that patient was seen in the ER hip on 06/12/2018.  Patient was earlier seen in the morning of 06/22/2018 for hip pain and was found to have dislocation of the hip which was corrected and patient was sent home. Patient comes to the hospital again with dislocation of the left hip with hip pain. Given conscious sedation and reduction was performed in the ER. Patient denies having any trauma, fall, injury.  No fever no chills.  No chest pain abdominal pain at the time of my evaluation. Patient does have chronic back pain as well as shoulder pain. No diarrhea reported patient actually has constipation. No recent change in medications reported as well. Patient tells me that when he saw his primary cardiologist he was informed that he may require a pacemaker in the near future.  Patient reports compliant with all his medication including pain medication.  No alcohol or drug abuse reported by the patient as well.  Patient's hip was reduced in the ER under conscious sedation.  EDP discussed with Dr. Alvan Dame who recommends the patient to be admitted in the hospital.  No further plan available from orthopedic providers.  At his baseline ambulates with assistance And is independent for most of his ADL; manages his medication on his own.  06/23/18: A. fib with slow ventricular response noted on telemetry this morning with history of second-degree AV block Mobitz type I.  Cardiology consulted.  Followed by Dr. Stanford Breed and Dr. Caryl Comes.  06/24/18: Plan for left THR today.  06/25/18: POD#1  post left hip revision.  Seen and examined at his bedside.  He has minimal pain in his left hip.  Denies any chest pain or dyspnea.  Recurrent sinus pauses reported, up to 2.3 seconds overnight.    Patient agreeable to transfer to [facility] if needed for possible pacemaker placement.  [facility] can arrange transfer.  Defer decision to cardiology.  Highly appreciate input.  Assessment/Plan: Principal Problem:   Failed total hip arthroplasty with dislocation, initial encounter [facility]) Active Problems:   Hyperlipidemia   Essential hypertension   COPD (chronic obstructive pulmonary disease) (HCC)   Abdominal aortic aneurysm (HCC)   Chronic back pain   Cirrhosis (HCC)   Bladder outlet obstruction   Second degree AV block, Mobitz type I   Aortic stenosis   Opiate dependence (HCC)   Chronic diastolic heart failure (HCC)   CKD (chronic kidney disease) stage 3, GFR 30-59 ml/min (HCC)  Post left hip revision on 06/24/2018 from recurrent left hip dislocation post hemiarthroplasty On presentation self-reported dislocation x3 post hemiarthroplasty Orthopedic surgery consulted and followed POD #1 post left hip revision on 06/24/2018 Pain management in place Follow recommendations per orthopedic surgery  Second-degree AV block Mobitz type I Followed outpatient with Dr. Caryl Comes Cardiology following May require pacemaker Avoid AV nodal blockade agents Continue to closely monitor on telemetry  Recurrent sinus pauses Asymptomatic Up to 2.3-second pauses reported overnight Cardiology following [facility] can arrange transfer to [facility] if cardiology decides on pacemaker placement Continue to closely monitor  Paroxysmal A. fib with slow  ventricular response Chads Vascor 5 Defer to cardiology to start anticoagulation post surgery A. fib reported on 2D echo recently done  Chronic diastolic CHF 2D echio done on 06/23/18 revealed EF>65% Afib on 2D echo Net I&O -3.3L since  admission  Severely dialated biatrial w possible PFO/Mild to moderate MR Afib on echo Defer to cardiology to start anticoagulation  Coronary artery disease status post CABG Denies anginal symptoms Continue aspirin and statin  Chronic diastolic CHF Last 2D echo done on 2017 revealed LVEF 65 to 70% with grade 1 diastolic dysfunction Euvolemic No sign of acute exacerbation  COPD Continue as needed albuterol nebs  Cirrhosis Appears compensated Continue Lasix, spironolactone, lactulose  AAA Asymptomatic 3 x 3.2 cm of on eighth 27 2019 Continue surveillance outpatient  Hypertension  -Blood pressure is normotensive -Continue home meds  CKD stage III -SCr is 1.3 on admission -Creatinine 1.36 from 1.28 with GFR 51 on 06/25/2018 -Avoid nephrotoxic agents/dehydration/hypotension -Continue to monitor urine output -Net I&O -3.3 L since admission  BPH Stable Continue tamsulosin and finasteride Continue to monitor urine output   Nutrition: Cardiac and carb modified diet  DVT Prophylaxis: subcutaneous Heparin 3 times daily  Advance goals of care discussion: full code   Consults: orthopedics, cardiology   Family Communication:  None at bedside    Objective: Vitals:   06/24/18 2002 06/25/18 0025 06/25/18 0455 06/25/18 1059  BP: (!) 150/137 (!) 125/51 (!) 129/51 (!) 148/53  Pulse: 68 (!) 42 (!) 55 (!) 48  Resp: 16 16 16 20   Temp: 97.6 F (36.4 C) 98 F (36.7 C) 98.2 F (36.8 C) 98.3 F (36.8 C)  TempSrc: Oral Oral Oral Oral  SpO2: 96% 96% 98% 95%  Weight:      Height:        Intake/Output Summary (Last 24 hours) at 06/25/2018 1333 Last data filed at 06/25/2018 0600 Gross per 24 hour  Intake 2858.29 ml  Output 2550 ml  Net 308.29 ml   Filed Weights   06/22/18 1750  Weight: 74.8 kg    Exam:  . General: 78 y.o. year-old male pleasant well-developed well-nourished in no acute distress.  Alert and oriented x3. . Cardiovascular: Irregular rate and  rhythm with no rubs or gallops.  No JVD or thyromegaly . Respiratory: Clear to auscultation with no wheezes or rales.  Poor inspiratory effort. . Abdomen: Soft nontender nondistended with normal bowel sounds x4 quadrants. . Musculoskeletal: Trace lower extremity edema. 2/4 pulses in all 4 extremities. [name] Psychiatry: Mood is appropriate for condition and setting   Data Reviewed: CBC: Recent Labs  Lab 06/22/18 1241 06/23/18 0408 06/24/18 1359 06/25/18 0339  WBC 7.2 6.5  --  8.3  NEUTROABS 5.7 4.6  --   --   HGB 10.2* 9.7* 9.9* 9.7*  HCT 34.0* 33.4* 29.0* 31.9*  MCV 87.6 89.1  --  87.6  PLT 244 209  --  818   Basic Metabolic Panel: Recent Labs  Lab 06/22/18 1241 06/23/18 0408 06/24/18 0637 06/24/18 1359 06/25/18 0339  NA 138 142 138 138 136  K 3.3* 3.5 3.6 4.0 4.2  CL 107 112* 107  --  104  CO2 24 24 25   --  26  GLUCOSE 132* 115* 100* 107* 158*  BUN 34* 27* 26*  --  30*  CREATININE 1.34* 1.25* 1.28*  --  1.36*  CALCIUM 8.4* 8.2* 8.6*  --  8.1*  MG  --  2.0  --   --   --  GFR: Estimated Creatinine Clearance: 49.7 mL/min (A) (by C-G formula based on SCr of 1.36 mg/dL (H)). Liver Function Tests: Recent Labs  Lab 06/23/18 0408  AST 25  ALT 24  ALKPHOS 161*  BILITOT 0.8  PROT 6.3*  ALBUMIN 2.9*   No results for input(s): LIPASE, AMYLASE in the last 168 hours. No results for input(s): AMMONIA in the last 168 hours. Coagulation Profile: No results for input(s): INR, PROTIME in the last 168 hours. Cardiac Enzymes: No results for input(s): CKTOTAL, CKMB, CKMBINDEX, TROPONINI in the last 168 hours. BNP (last 3 results) No results for input(s): PROBNP in the last 8760 hours. HbA1C: No results for input(s): HGBA1C in the last 72 hours. CBG: No results for input(s): GLUCAP in the last 168 hours. Lipid Profile: No results for input(s): CHOL, HDL, LDLCALC, TRIG, CHOLHDL, LDLDIRECT in the last 72 hours. Thyroid Function Tests: No results for input(s): TSH, T4TOTAL,  FREET4, T3FREE, THYROIDAB in the last 72 hours. Anemia Panel: No results for input(s): VITAMINB12, FOLATE, FERRITIN, TIBC, IRON, RETICCTPCT in the last 72 hours. Urine analysis:    Component Value Date/Time   COLORURINE YELLOW 01/21/2017 0450   APPEARANCEUR CLEAR 01/21/2017 0450   LABSPEC 1.011 01/21/2017 0450   PHURINE 5.0 01/21/2017 0450   GLUCOSEU NEGATIVE 01/21/2017 0450   HGBUR NEGATIVE 01/21/2017 0450   BILIRUBINUR 3+ 09/16/2017 1617   KETONESUR NEGATIVE 01/21/2017 0450   PROTEINUR Positive (A) 09/16/2017 1617   PROTEINUR NEGATIVE 01/21/2017 0450   UROBILINOGEN 2.0 (A) 09/16/2017 1617   UROBILINOGEN 1.0 07/28/2013 2005   NITRITE pos 09/16/2017 1617   NITRITE NEGATIVE 01/21/2017 0450   LEUKOCYTESUR Large (3+) (A) 09/16/2017 1617   Sepsis Labs: @LABRCNTIP (procalcitonin:4,lacticidven:4)  ) Recent Results (from the past 240 hour(s))  Surgical pcr screen     Status: None   Collection Time: 06/24/18  7:06 AM  Result Value Ref Range Status   MRSA, PCR NEGATIVE NEGATIVE Final   Staphylococcus aureus NEGATIVE NEGATIVE Final    Comment: (NOTE) The Xpert SA Assay (FDA approved for NASAL specimens in patients 25 years of age and older), is one component of a comprehensive surveillance program. It is not intended to diagnose infection nor to guide or monitor treatment. Performed at [facility], [place] [address], [place], [place] [address]       Studies: Dg Pelvis Portable  Result Date: 06/24/2018 CLINICAL DATA:  Left acetabular revision EXAM: PORTABLE PELVIS 1-2 VIEWS COMPARISON:  06/22/2018 FINDINGS: Pubic symphysis and rami are intact. Interval revision of left hip replacement with normal alignment and no fracture. Gas in the soft tissues consistent with recent surgery IMPRESSION: Left hip replacement with expected surgical changes Electronically Signed   By: Donavan Foil M.D.   On: 06/24/2018 15:38    Scheduled Meds: . aspirin  81 mg Oral Daily  .  docusate sodium  100 mg Oral BID  . ferrous sulfate  325 mg Oral TID PC  . finasteride  5 mg Oral Daily  . fluticasone  1 spray Each Nare Daily  . furosemide  80 mg Oral Daily  . heparin  5,000 Units Subcutaneous Q8H  . lidocaine  2 patch Transdermal Q24H  . polyethylene glycol  17 g Oral Daily  . spironolactone  25 mg Oral Daily  . tamsulosin  0.4 mg Oral Daily    Continuous Infusions: . sodium chloride Stopped (06/24/18 1806)  . methocarbamol (ROBAXIN) IV       LOS: 1 day     Lorenda Cahill  [place] Crane, MD [facility] Pager [phone]  If 7PM-7AM, please contact night-coverage [email or web address] Password [email or web address] 06/25/2018, 1:33 PM

## 2018-06-25 NOTE — Progress Notes (Signed)
Physical Therapy Treatment Patient Details Name: [name] MRN: [id number] DOB: 07-10-1940 Today's Date: 06/25/2018    History of Present Illness 78 yo male admitted with L dislocated hip. S/P L acetabular hip revision 06/24/18. Hx of CP-lift in shoe for leg length discrepancy, COPD, CHF, CABG, falls.    PT Comments    Progressing with mobility. Pt requires verbal and tactile cues for adherence to hip precautions. L hip is tight. Noted difficulty with hip abduction/extension and knee extension. Unsure if home is a safe d/c plan since pt lives alone. Pt prefers to d/c home. Will continue to assess and progress activity.    Follow Up Recommendations  SNF vs Home health PT;Supervision/Assistance - 24 hour(depending on progress and assist available at home)     Equipment Recommendations  3in1 (PT)    Recommendations for Other Services       Precautions / Restrictions Precautions Precautions: Posterior Hip;Fall Precaution Booklet Issued: Yes (comment) Precaution Comments: Abduction Pillow in bed/at rest. Pt able to recall 2/3 hip precautions. Reviewed all hip precautions and WBAT status. Restrictions Weight Bearing Restrictions: No LLE Weight Bearing: Weight bearing as tolerated    Mobility  Bed Mobility Overal bed mobility: Needs Assistance Bed Mobility: Sit to Supine       Sit to supine: HOB elevated;Mod assist   General bed mobility comments: Assist for L LE/maintaining precautions. VCs safety, technique, hand placement, adherence to precautions. Pt relied on bedrail.   Transfers Overall transfer level: Needs assistance Equipment used: Rolling walker (2 wheeled) Transfers: Sit to/from Stand Sit to Stand: Mod assist         General transfer comment: VCs safety, techique, hand/LE placement. Assist to rise, stabilize, control descent.   Ambulation/Gait Ambulation/Gait assistance: Min assist Gait Distance (Feet): 15 Feet(x2) Assistive device: Rolling walker (2  wheeled) Gait Pattern/deviations: Step-to pattern;Trunk flexed     General Gait Details: VCs safety, sequencing, posture, distance from RW, proper use of RW. Assist to stabilize throughout distance.    Stairs             Wheelchair Mobility    Modified Rankin (Stroke Patients Only)       Balance Overall balance assessment: Needs assistance         Standing balance support: Bilateral upper extremity supported Standing balance-Leahy Scale: Poor                              Cognition Arousal/Alertness: Awake/alert Behavior During Therapy: WFL for tasks assessed/performed Overall Cognitive Status: Within Functional Limits for tasks assessed                                        Exercises      General Comments        Pertinent Vitals/Pain Pain Assessment: Faces Faces Pain Scale: Hurts even more Pain Location: L hip Pain Descriptors / Indicators: Discomfort;Sore Pain Intervention(s): Monitored during session;Repositioned    Home Living                      Prior Function            PT Goals (current goals can now be found in the care plan section) Progress towards PT goals: Progressing toward goals    Frequency    Min 5X/week      PT Plan  Current plan remains appropriate    Co-evaluation              AM-PAC PT "6 Clicks" Mobility   Outcome Measure  Help needed turning from your back to your side while in a flat bed without using bedrails?: A Little Help needed moving from lying on your back to sitting on the side of a flat bed without using bedrails?: A Lot Help needed moving to and from a bed to a chair (including a wheelchair)?: A Little Help needed standing up from a chair using your arms (e.g., wheelchair or bedside chair)?: A Little Help needed to walk in hospital room?: A Little Help needed climbing 3-5 steps with a railing? : A Lot 6 Click Score: 16    End of Session Equipment Utilized  During Treatment: Gait belt Activity Tolerance: Patient tolerated treatment well Patient left: in bed;with call bell/phone within reach;with bed alarm set   PT Visit Diagnosis: Unsteadiness on feet (R26.81);Repeated falls (R29.6);History of falling (Z91.81);Other abnormalities of gait and mobility (R26.89);Muscle weakness (generalized) (M62.81);Difficulty in walking, not elsewhere classified (R26.2);Pain Pain - Right/Left: Left Pain - part of body: Hip     Time: 3338-3291 PT Time Calculation (min) (ACUTE ONLY): 22 min  Charges:  $Gait Training: 8-22 mins                       Weston Anna, PT Acute Rehabilitation Services Pager: [phone] Office: [phone]

## 2018-06-25 NOTE — Progress Notes (Signed)
     Subjective: 1 Day Post-Op Procedure(s) (LRB): ACETABULAR HIP REVISION POSTERIOR (Left)   Patient reports pain as mild with regards to the hip.  Pain controlled. No events reported throughout the night.  Dr. Alvan Dame discussed the procedure and th findings during surgery.  Findings including the tightness of the hip area and the potential for dislocations.  It was stressed that he should use use the abduction pillow while in bed / rest.   Objective:   VITALS:   Vitals:   06/25/18 0025 06/25/18 0455  BP: (!) 125/51 (!) 129/51  Pulse: (!) 42 (!) 55  Resp: 16 16  Temp: 98 F (36.7 C) 98.2 F (36.8 C)  SpO2: 96% 98%    Dorsiflexion/Plantar flexion intact Incision: dressing C/D/I No cellulitis present Compartment soft  LABS Recent Labs    06/22/18 1241 06/23/18 0408 06/24/18 1359 06/25/18 0339  HGB 10.2* 9.7* 9.9* 9.7*  HCT 34.0* 33.4* 29.0* 31.9*  WBC 7.2 6.5  --  8.3  PLT 244 209  --  179    Recent Labs    06/23/18 0408 06/24/18 0637 06/24/18 1359 06/25/18 0339  NA 142 138 138 136  K 3.5 3.6 4.0 4.2  BUN 27* 26*  --  30*  CREATININE 1.25* 1.28*  --  1.36*  GLUCOSE 115* 100* 107* 158*     Assessment/Plan: 1 Day Post-Op Procedure(s) (LRB): ACETABULAR HIP REVISION POSTERIOR (Left) Advance diet Up with therapy Discharge home with home health when ready He would like to d/c home, so hopefully he can stay through the weekend to receive PT to make sure he is safe for d/c home. HHPT order for when he d/c home.  Follow up in 2 weeks at [facility] ([place]). Follow up with OLIN,Jericca Russett D in 2 weeks.  Contact information:  [facility]) [address], [place] [address] [phone]    Ortho recommendations:  ASA 81 mg bid for 4 weeks for anticoagulation, unless other medically indicated.  Norco for pain management (Rx written).  MiraLax and Colace for constipation  Iron 325 mg  tid for 2-3 weeks   WBAT on the left leg.  Posterior hip precautions  Dressing to remain in place until follow in clinic in 2 weeks.  Dressing is waterproof and may shower with it in place.  Follow up in 2 weeks at [facility]. Follow up with OLIN,Javayah Magaw D in 2 weeks.  Contact information:  [facility] [address] [place] Daved Mcfann   PAC  06/25/2018, 7:31 AM

## 2018-06-25 NOTE — Progress Notes (Signed)
Progress Note  Patient Name: [name] Date of Encounter: 06/25/2018  Primary Cardiologist: Kirk Ruths, MD   Subjective   Pt is alert, oriented and pleasant. He denies chest discomfort, shortness of breath or lightheadedness. He says that his heart rate has been low since his bypass surgery. He had surgery yesterday and is having some discomfort in the left hip.   Per notes CCMD called several times to report pauses up to 2.3 seconds.  Inpatient Medications    Scheduled Meds: . aspirin  81 mg Oral Daily  . dexamethasone  10 mg Intravenous Once  . docusate sodium  100 mg Oral BID  . ferrous sulfate  325 mg Oral TID PC  . finasteride  5 mg Oral Daily  . fluticasone  1 spray Each Nare Daily  . furosemide  80 mg Oral Daily  . heparin  5,000 Units Subcutaneous Q8H  . lidocaine  2 patch Transdermal Q24H  . polyethylene glycol  17 g Oral Daily  . spironolactone  25 mg Oral Daily  . tamsulosin  0.4 mg Oral Daily   Continuous Infusions: . sodium chloride Stopped (06/24/18 1806)  . methocarbamol (ROBAXIN) IV     PRN Meds: alum & mag hydroxide-simeth, bisacodyl, diphenhydrAMINE, lactulose, magnesium citrate, menthol-cetylpyridinium **OR** phenol, methocarbamol **OR** methocarbamol (ROBAXIN) IV, metoCLOPramide **OR** metoCLOPramide (REGLAN) injection, Muscle Rub, ondansetron **OR** ondansetron (ZOFRAN) IV, oxyCODONE, sertraline   Vital Signs    Vitals:   06/24/18 1803 06/24/18 2002 06/25/18 0025 06/25/18 0455  BP: (!) 129/44 (!) 150/137 (!) 125/51 (!) 129/51  Pulse: (!) 58 68 (!) 42 (!) 55  Resp: 19 16 16 16   Temp:  97.6 F (36.4 C) 98 F (36.7 C) 98.2 F (36.8 C)  TempSrc:  Oral Oral Oral  SpO2: 99% 96% 96% 98%  Weight:      Height:        Intake/Output Summary (Last 24 hours) at 06/25/2018 0732 Last data filed at 06/25/2018 0600 Gross per 24 hour  Intake 2858.29 ml  Output 2800 ml  Net 58.29 ml   Last 3 Weights 06/22/2018 06/22/2018 06/12/2018  Weight (lbs) 165  lb 165 lb 165 lb  Weight (kg) 74.844 kg 74.844 kg 74.844 kg      Telemetry    Bradycardia with rates in the 40's overnight with occ pauses up to 2.4 seconds. Rates in the 60's when awake. Rhythm is irregular with no discernible P waves - Personally Reviewed  ECG    No new tracings - Personally Reviewed  Physical Exam   GEN: No acute distress.   Neck: No JVD Cardiac: Slow irregularly irregular rhythm, 2/6 systolic murmur at RUSB radiates to carotids Respiratory: Clear to auscultation bilaterally. GI: Soft, nontender, non-distended  MS: No edema; surgical dressing on left hip Neuro:  Nonfocal  Psych: Normal affect   Labs    Chemistry Recent Labs  Lab 06/23/18 0408 06/24/18 0637 06/24/18 1359 06/25/18 0339  NA 142 138 138 136  K 3.5 3.6 4.0 4.2  CL 112* 107  --  104  CO2 24 25  --  26  GLUCOSE 115* 100* 107* 158*  BUN 27* 26*  --  30*  CREATININE 1.25* 1.28*  --  1.36*  CALCIUM 8.2* 8.6*  --  8.1*  PROT 6.3*  --   --   --   ALBUMIN 2.9*  --   --   --   AST 25  --   --   --   ALT  24  --   --   --   ALKPHOS 161*  --   --   --   BILITOT 0.8  --   --   --   GFRNONAA 56* 54*  --  51*  GFRAA >60 >60  --  59*  ANIONGAP 6 6  --  6     Hematology Recent Labs  Lab 06/22/18 1241 06/23/18 0408 06/24/18 1359 06/25/18 0339  WBC 7.2 6.5  --  8.3  RBC 3.88* 3.75*  --  3.64*  HGB 10.2* 9.7* 9.9* 9.7*  HCT 34.0* 33.4* 29.0* 31.9*  MCV 87.6 89.1  --  87.6  MCH 26.3 25.9*  --  26.6  MCHC 30.0 29.0*  --  30.4  RDW 16.6* 16.7*  --  16.4*  PLT 244 209  --  179    Cardiac EnzymesNo results for input(s): TROPONINI in the last 168 hours. No results for input(s): TROPIPOC in the last 168 hours.   BNPNo results for input(s): BNP, PROBNP in the last 168 hours.   DDimer No results for input(s): DDIMER in the last 168 hours.   Radiology    Dg Pelvis Portable  Result Date: 06/24/2018 CLINICAL DATA:  Left acetabular revision EXAM: PORTABLE PELVIS 1-2 VIEWS COMPARISON:   06/22/2018 FINDINGS: Pubic symphysis and rami are intact. Interval revision of left hip replacement with normal alignment and no fracture. Gas in the soft tissues consistent with recent surgery IMPRESSION: Left hip replacement with expected surgical changes Electronically Signed   By: Donavan Foil M.D.   On: 06/24/2018 15:38    Cardiac Studies   Echocardiogram 06/23/2018: 1. The left ventricle has hyperdynamic systolic function, with an ejection fraction of >65%. The cavity size was severely dilated. There is mildly increased left ventricular wall thickness. Left ventricular diastology could not be evaluated secondary to atrial fibrillation. No evidence of left ventricular regional wall motion abnormalities. 2. The right ventricle was not well visualized. The cavity was mildly enlarged. There is not assessed. 3. Left atrial size was severely dilated. 4. Right atrial size was severely dilated. 5. The mitral valve is degenerative. Mild thickening of the mitral valve leaflet. Mitral valve regurgitation is mild to moderate by color flow Doppler. 6. The tricuspid valve is normal in structure. 7. The aortic valve is tricuspid Mild sclerosis of the aortic valve. 8. The aortic root and ascending aorta are normal in size and structure. 9. The inferior vena cava was dilated in size with <50% respiratory variability. 10. No evidence of left ventricular regional wall motion abnormalities. 11. Possible PFO.  Summary: LVEF 65-70%, mild LVH, normal wall motion, severe biatrail enlargement, mild to moderate MR, mild TR, RVSP 46 mmHg, dilated IVC that does not collapse, cannot exclude PFO.   Patient Profile     78 y.o. 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 Cardiology was asked to evaluate patient given chronic bradycardia and history of Wenckebach. Underwent acetabular hip revision done on 06/24/2018.   Assessment & Plan    Possible atrial fibrillation with SVR/Wenckebach/hx of aflutter s/p ablation in 2016 -Echo showed LVEF >65%. LV severely dilated , no RWMA. LA and RA severely dilated. Possible PFO. -Underwent hip surgery yesterday.  -Avoid AV nodal blocking agents. -Heart rhythm is very irregular with no discernible P waves,  rates 40's overnight and 60's with activity. I lean toward it being afib.  -Pt is currently asymptomatic with no indication for pacemaker. Will need to monitor with increased activity.  I discussed with pt symptoms to watch for when he starts getting up with PT. Symptoms of poor perfusion like lightheadedness, activity intolerance, chest pressure.  -CHA2DS-VASc = 5 (CHF, CAD, HTN, age x2). If truly atrial fibrillation or atrial flutter, will need long-term anticoagulation.  CAD s/p CABG in 2010 -No chest pain. -Continues aspirin and statin  Chronic diastolic CHF -Normal EF on echo -Appears euvolemic.  -On chronic lasix and spironolactone. Labs stable.   Aortic stenosis - Very mild aortic stenosis noted on Echo in 2017 with mean gradient of 11 mmHg and peak gradient of 20 mmHg but no evidence of aortic stenosis on Echo this admission.   AAA - Last abdominal ultrasound in 11/2017 showed distal AAA with the largest aortic measurement being 3.0cm, essential unchanged from prior imaging.  - Will need annual abdominal ultrasound in 11/2018.  Carotid Artery Stenosis - Most recent carotid dopplers showed 1-30% stenosis of left ICA, 50-59% stenosis of right ICA, right ECA >50% stenosis, and non-hemodynamically significant plaque <50% noted in the CCA. - Repeat dopplers recommended in 11/2018.     For questions or updates, please contact [place] Please consult [email or web address] for contact info under         Signed, Daune Perch, NP  06/25/2018, 7:32 AM

## 2018-06-25 NOTE — Progress Notes (Signed)
CMT reported pt had 2.25 second pause. VSS. Pt asymptomatic. On call provider made aware. Will continue to monitor closely.

## 2018-06-25 NOTE — Op Note (Signed)
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  PROCEDURE:  Revision left hip surgery, conversion of failed left hip surgery to total hip arthroplasty.  COMPONENTS USED:  A DePuy 54 mm Pinnacle shell, a 36+4 ten-degree face-changing liner, a 36 +12 Articul/Eze metal ball.  SURGEON:  [name], MD  ASSISTANT:  Danae Orleans, PA-C  Note that Mr. Guinevere Scarlet was present for the entirety of the case from preoperative position, perioperative management of the operative extremity, general facilitation of the case and primary wound closure.  ANESTHESIA:  General.  BLOOD LOSS:  About 300 mL.  COMPLICATIONS:  None apparent.  INDICATIONS FOR PROCEDURE:  The patient is a 78 year old gentleman with history of left femoral neck fracture.  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  At the time of his index surgical  hemiarthroplasty for femoral neck fracture, he was noted to have significant contractures.  He had done well with his hip for 4 months and then unfortunately had a fall at his home.  Since that fall, he has had now 3 episodes of subluxation/dislocation  of his hip joint.  He was admitted to the hospital after his last reduction with plans to proceed with surgical intervention for revision to better evaluate his acetabular bone stock to evaluate a reason and rationale for recurrent instability.  Consent  for improved pain and limited dislocations.  Discussed the risk of recurrent instability of the hip, need for future surgery, DVT, infection were all reviewed, and consent was obtained for benefit of pain  relief.  PROCEDURE IN DETAIL:  The patient was brought to the operative theater.  Once adequate anesthesia, preoperative antibiotics, and Ancef administered as well as tranexamic acid and Decadron, he was positioned into the right lateral decubitus position, left  hip up.  Under anesthesia, even his contractures of the left lower extremity were very significant.  He was only able to be abducted to about 10 degrees.  He had significant hip flexion contracture without ability to get him close to neutral, maybe 30  degrees of extension to neutral and then had an adductor contracture as well.  Nonetheless, the left lower extremity was prepped and draped in sterile fashion.  A timeout was performed identifying the patient, the planned procedure and extremity.  His  old incision was utilized.  Soft tissue dissection was carried down to the iliotibial band and gluteal fascia, which was incised.  He had a large seroma from his recent traumatic dislocations, which was evacuated.  The posterior aspect of the hip was  exposed, removing capsule and scar.  We then were able to dislocate the hip and remove the femoral ball.  I elevated soft tissues off the ilium and then placed the trunnion up onto the ilium and retracted the femur anteriorly.  On further examination,  inspection did reveal that about 10% to 15% of the posterior wall of the acetabulum had been fractured and was avulsed off the back.  This was excised.  Once I had the hip adequately exposed and retractors were placed, I began reaming with a 48 mm reamer  and reamed up to 53 mm.  We selected a 54 mm cup.  This cup was then impacted with  good initial scratch fit.  I initially evaluated the orientation of the hip and felt that his abduction was appropriate based on the use of the hip guide and his forward  flexion as I deemed appropriate with significantly forward flexed as compared to normal.  I then placed a cancellous screw into the ilium and did trial  reductions.  I first used a 36+4 neutral AltrX liner and various head sizes, initially from 36+5, all  the way up to a 12+12 ball.  Here in hip flexion, internal rotation, and adduction, the worst possible position he could be in, there was still evidence of some instability.  For that reason, I removed the trial components, repositioned the trunnion on  the ileum, adjusted and forward flexed the cup a few more millimeters, probably 5 degrees more of anteversion.  Rechecked it with the guide.  I then placed 2 cancellous screws into the ilium.  At this point, I felt that it was going to be best for him to  use an offset liner with a 10-degree focus in the 3 o'clock position for this left hip.  The final 36+4 ten-degree face-changing liner was then opened and impacted, again with the lip portion or elevated portion around 3:00.  Once this was impacted, I  retrialed and found that with a +12 ball, this provided the best chance of [place] to have a stable hip in all positions.  Again, there was very limited mobility with extension preoperatively and postoperatively.  However, in the hip flexion, internal  rotation and abducted position, it seemed to be the most stable.  The final 36+12 Articul/Eze metal ball was then impacted on a clean and dried trunnion, and the hip was reduced.  The hip had been irrigated throughout the case and again at this point.   There was no posterior capsule reapproximated.  The iliotibial band was reapproximated, and gluteal fascia was reapproximated using #1 Vicryl and #1 Stratafix suture.  The remainder of the wound was closed with 2-0 Vicryl and a running Monocryl stitch.   The hip was then cleaned, dried and dressed sterilely using surgical glue and an Aquacel dressing.  I felt that it would be in his best interest to place him in an abduction pillow, at least for the initial postoperative period.  We will instruct him  later on hip precautions and posterior hip precautions.  An  abduction pillow may be good for him to rest his legs in a more neutral position to prevent any complications.  He will be admitted in the hospital and be discharged hopefully to home within a  2-3 day period of time to assure his physical safety and security.  Physical therapy will be ordered.  I will see him back in the office in 2 weeks otherwise.  LN/NUANCE  D:06/25/2018 T:06/25/2018 JOB:[id number]

## 2018-06-26 LAB — CBC
HCT: 30.6 % — ABNORMAL LOW (ref 39.0–52.0)
Hemoglobin: 9.1 g/dL — ABNORMAL LOW (ref 13.0–17.0)
MCH: 26.7 pg (ref 26.0–34.0)
MCHC: 29.7 g/dL — ABNORMAL LOW (ref 30.0–36.0)
MCV: 89.7 fL (ref 80.0–100.0)
Platelets: 180 10*3/uL (ref 150–400)
RBC: 3.41 MIL/uL — ABNORMAL LOW (ref 4.22–5.81)
RDW: 17.2 % — ABNORMAL HIGH (ref 11.5–15.5)
WBC: 8.7 10*3/uL (ref 4.0–10.5)
nRBC: 0 % (ref 0.0–0.2)

## 2018-06-26 LAB — BASIC METABOLIC PANEL
Anion gap: 8 (ref 5–15)
BUN: 47 mg/dL — ABNORMAL HIGH (ref 8–23)
CO2: 24 mmol/L (ref 22–32)
Calcium: 7.9 mg/dL — ABNORMAL LOW (ref 8.9–10.3)
Chloride: 103 mmol/L (ref 98–111)
Creatinine, Ser: 1.41 mg/dL — ABNORMAL HIGH (ref 0.61–1.24)
GFR calc Af Amer: 56 mL/min — ABNORMAL LOW (ref >60)
GFR calc non Af Amer: 48 mL/min — ABNORMAL LOW (ref >60)
Glucose, Bld: 160 mg/dL — ABNORMAL HIGH (ref 70–99)
Potassium: 4 mmol/L (ref 3.5–5.1)
Sodium: 135 mmol/L (ref 135–145)

## 2018-06-26 MED ORDER — IBUPROFEN 200 MG PO TABS
400.0000 mg | ORAL_TABLET | Freq: Once | ORAL | Status: AC
  Administered 2018-06-26: 400 mg via ORAL
  Filled 2018-06-26: qty 2

## 2018-06-26 NOTE — Progress Notes (Signed)
Physical Therapy Treatment Patient Details Name: [name] MRN: [id number] DOB: 06-12-1940 Today's Date: 06/26/2018    History of Present Illness 78 yo male admitted with L dislocated hip. S/P L acetabular hip revision 06/24/18. Hx of CP-lift in shoe for leg length discrepancy, COPD, CHF, CABG, falls.    PT Comments    POD # 2 am session Assisted with amb in hallway.  Required repeat instruction on THP. General transfer comment: 100% VC's to avoid hip flex > 90 degrees as pt goes to sit and stand he tends to forward flex his trunk.  Instructed to sit "straight" up and down. General Gait Details: 75% VC's on proper upright posture and proper walker to self distance as well as safety with turns. Practiced stairs.  General stair comments: 75% VC's on proper tech and sequencing esp down.  Returned to room and Then returned to room to perform some TE's following HEP handout.  Instructed on proper tech, freq as well as use of ICE.     Follow Up Recommendations  SNF;Home health PT;Supervision/Assistance - 24 hour(pt declines SNF and stated he has "frien" who is a Marine scientist from [place] to [employer] stay with him)     Equipment Recommendations  3in1 (PT)    Recommendations for Other Services       Precautions / Restrictions Precautions Precautions: Posterior Hip;Fall Precaution Comments: Abduction Pillow in bed/at rest. Pt able to recall 2/3 hip precautions. Reviewed all hip precautions and WBAT status. Restrictions Weight Bearing Restrictions: Yes    Mobility  Bed Mobility Overal bed mobility: Needs Assistance Bed Mobility: Sit to Supine           General bed mobility comments: pt sitting EOB on arrival with tray table in front.  Assisted B LE back to bed and positioned to comfort and proper alignment  Transfers Overall transfer level: Needs assistance Equipment used: Rolling walker (2 wheeled) Transfers: Sit to/from Stand Sit to Stand: Min guard;Supervision          General transfer comment: 100% VC's to avoid hip flex > 90 degrees as pt goes to sit and stand he tends to forward flex his trunk.  Instructed to sit "straight" up and down.  Ambulation/Gait Ambulation/Gait assistance: Min guard Gait Distance (Feet): 22 Feet Assistive device: Rolling walker (2 wheeled) Gait Pattern/deviations: Step-to pattern;Trunk flexed Gait velocity: decreased    General Gait Details: 75% VC's on proper upright posture and proper walker to self distance as well as safety with turns.   Stairs Stairs: Yes Stairs assistance: Min guard;Min assist Stair Management: One rail Left;Sideways;Forwards Number of Stairs: 3 General stair comments: 75% VC's on proper tech and sequencing esp down   Wheelchair Mobility    Modified Rankin (Stroke Patients Only)       Balance                                            Cognition Arousal/Alertness: Awake/alert Behavior During Therapy: WFL for tasks assessed/performed Overall Cognitive Status: Within Functional Limits for tasks assessed                                        Exercises   Total Hip Replacement TE's 10 reps ankle pumps 10 reps knee presses 10 reps heel slides 10 reps  SAQ's 10 reps ABD Followed by ICE     General Comments        Pertinent Vitals/Pain Pain Assessment: 0-10 Faces Pain Scale: Hurts little more Pain Location: L hip Pain Descriptors / Indicators: Discomfort;Sore Pain Intervention(s): Monitored during session;Premedicated before session;Repositioned;Ice applied    Home Living                      Prior Function            PT Goals (current goals can now be found in the care plan section)      Frequency    Min 5X/week      PT Plan Current plan remains appropriate    Co-evaluation              AM-PAC PT "6 Clicks" Mobility   Outcome Measure  Help needed turning from your back to your side while in a flat bed  without using bedrails?: A Little Help needed moving from lying on your back to sitting on the side of a flat bed without using bedrails?: A Little Help needed moving to and from a bed to a chair (including a wheelchair)?: A Little Help needed standing up from a chair using your arms (e.g., wheelchair or bedside chair)?: A Little Help needed to walk in hospital room?: A Little Help needed climbing 3-5 steps with a railing? : A Little 6 Click Score: 18    End of Session Equipment Utilized During Treatment: Gait belt Activity Tolerance: Patient tolerated treatment well Patient left: in bed;with call bell/phone within reach;with bed alarm set Nurse Communication: Mobility status PT Visit Diagnosis: Unsteadiness on feet (R26.81);Repeated falls (R29.6);History of falling (Z91.81);Other abnormalities of gait and mobility (R26.89);Muscle weakness (generalized) (M62.81);Difficulty in walking, not elsewhere classified (R26.2);Pain Pain - Right/Left: Left Pain - part of body: Hip     Time: 1610-9604 PT Time Calculation (min) (ACUTE ONLY): 28 min  Charges:  $Gait Training: 8-22 mins $Therapeutic Exercise: 8-22 mins                     Rica Koyanagi  PTA Acute  Rehabilitation Services Pager      [phone] Office      [phone]

## 2018-06-26 NOTE — Evaluation (Addendum)
Occupational Therapy Evaluation Patient Details Name: [name] MRN: [id number] DOB: 05/18/1940 Today's Date: 06/26/2018    History of Present Illness 78 yo male admitted with L dislocated hip. S/P L acetabular hip revision 06/24/18. Hx of CP-lift in shoe for leg length discrepancy, COPD, CHF, CABG, falls.   Clinical Impression   Pt admitted with L dislocated hip Pt currently with functional limitations due to the deficits listed below (see OT Problem List).  Pt will benefit from skilled OT to increase their safety and independence with ADL and functional mobility for ADL to facilitate discharge to venue listed below.      Follow Up Recommendations  Home health OT;Supervision - Intermittent - pt refusing SNF. States son and friend will provide A as needed   Equipment Recommendations  3 in 1 bedside commode    Recommendations for Other Services       Precautions / Restrictions Precautions Precautions: Posterior Hip;Fall Precaution Booklet Issued: Yes (comment) Precaution Comments: Abduction Pillow in bed/at rest. Pt able to recall 2/3 hip precautions. Reviewed all hip precautions and WBAT status.      Mobility Bed Mobility Overal bed mobility: Needs Assistance Bed Mobility: Supine to Sit     Supine to sit: Min assist;HOB elevated     General bed mobility comments: assisted back to bed per pt request  Transfers Overall transfer level: Needs assistance Equipment used: Rolling walker (2 wheeled) Transfers: Sit to/from [facility] Sit to Stand: Min guard Stand pivot transfers: Min guard       General transfer comment: needs further education with posterior hip precautions    Balance Overall balance assessment: Needs assistance         Standing balance support: Bilateral upper extremity supported Standing balance-Leahy Scale: Poor                             ADL either performed or assessed with clinical judgement   ADL Overall  ADL's : Needs assistance/impaired Eating/Feeding: Set up;Sitting   Grooming: Set up;Sitting   Upper Body Bathing: Set up;Sitting   Lower Body Bathing: Moderate assistance;Sit to/from stand;Cueing for sequencing;Cueing for safety;Cueing for compensatory techniques;Adhering to hip precautions   Upper Body Dressing : Set up;Sitting   Lower Body Dressing: Moderate assistance;Sit to/from stand;Cueing for sequencing;Cueing for safety;Cueing for compensatory techniques;Adhering to hip precautions   Toilet Transfer: Minimal assistance;RW;Ambulation;Comfort height toilet   Toileting- Clothing Manipulation and Hygiene: Minimal assistance;Sit to/from stand;Cueing for sequencing;Cueing for safety         General ADL Comments: needs further education regarding posterior hip precautions. Pt states he has AE     Vision Patient Visual Report: No change from baseline              Pertinent Vitals/Pain Pain Score: 4  Pain Location: L hip Pain Descriptors / Indicators: Discomfort;Sore     Hand Dominance     Extremity/Trunk Assessment Upper Extremity Assessment Upper Extremity Assessment: Generalized weakness           Communication Communication Communication: No difficulties   Cognition Arousal/Alertness: Awake/alert Behavior During Therapy: WFL for tasks assessed/performed Overall Cognitive Status: Within Functional Limits for tasks assessed                                                Home Living  Family/patient expects to be discharged to:: Private residence Living Arrangements: Alone Available Help at Discharge: Available PRN/intermittently;Family Type of Home: Mobile home Home Access: Stairs to enter Entrance Stairs-Number of Steps: 3 Entrance Stairs-Rails: Can reach both Home Layout: One level     Bathroom Shower/Tub: Walk-in shower         Home Equipment: Environmental consultant - 4 wheels          Prior Functioning/Environment Level of  Independence: Independent with assistive device(s)                 OT Problem List: Decreased strength;Decreased activity tolerance;Impaired balance (sitting and/or standing);Decreased safety awareness;Decreased knowledge of precautions;Decreased knowledge of use of DME or AE      OT Treatment/Interventions: Self-care/ADL training;Patient/family education;Therapeutic activities    OT Goals(Current goals can be found in the care plan section)    OT Frequency: Min 2X/week   Barriers to D/C: Decreased caregiver support             AM-PAC OT "6 Clicks" Daily Activity     Outcome Measure Help from another person eating meals?: None Help from another person taking care of personal grooming?: None Help from another person toileting, which includes using toliet, bedpan, or urinal?: A Little Help from another person bathing (including washing, rinsing, drying)?: A Little Help from another person to put on and taking off regular upper body clothing?: None Help from another person to put on and taking off regular lower body clothing?: A Little 6 Click Score: 21   End of Session Equipment Utilized During Treatment: Rolling walker;Gait belt Nurse Communication: Mobility status  Activity Tolerance: Patient tolerated treatment well Patient left: in chair;with call bell/phone within reach;with chair alarm set  OT Visit Diagnosis: Unsteadiness on feet (R26.81);Muscle weakness (generalized) (M62.81)                Time: 2585-2778 OT Time Calculation (min): 25 min Charges:  OT General Charges $OT Visit: 1 Visit OT Evaluation $OT Eval Low Complexity: 1 Low  Kari Baars, OT Acute Rehabilitation Services [phone] Office- [phone], Edwena Felty D 06/26/2018, 6:35 PM

## 2018-06-26 NOTE — Progress Notes (Addendum)
PROGRESS NOTE  [name] GQQ:[id number] DOB: 01/07/1941 DOA: 06/22/2018 PCP: [name], MD  HPI/Recap of past 24 hours: [name] is a 78 y.o. male with Past medical history of AAA, CAD S/P CABG, carotid stenosis, CVA, chronic diastolic CHF, cirrhosis of the liver, COPD, HTN, HLD, Mobitz type I AV block. The patient presents with complaints of hip pain. Underwent left hip arthroplasty October 2019.  After that patient was seen in the ER hip on 06/12/2018.  Patient was earlier seen in the morning of 06/22/2018 for hip pain and was found to have dislocation of the hip which was corrected and patient was sent home. Patient comes to the hospital again with dislocation of the left hip with hip pain. Given conscious sedation and reduction was performed in the ER. Patient denies having any trauma, fall, injury.  No fever no chills.  No chest pain abdominal pain at the time of my evaluation. Patient does have chronic back pain as well as shoulder pain. No diarrhea reported patient actually has constipation. No recent change in medications reported as well. Patient tells me that when he saw his primary cardiologist he was informed that he may require a pacemaker in the near future.  Patient reports compliant with all his medication including pain medication.  No alcohol or drug abuse reported by the patient as well.  Patient's hip was reduced in the ER under conscious sedation.  EDP discussed with Dr. Alvan Dame who recommends the patient to be admitted in the hospital.  No further plan available from orthopedic providers.  At his baseline ambulates with assistance And is independent for most of his ADL; manages his medication on his own.  06/23/18: A. fib with slow ventricular response noted on telemetry this morning with history of second-degree AV block Mobitz type I.  Cardiology consulted.  Followed by Dr. Stanford Breed and Dr. Caryl Comes.  06/24/18: Plan for left THR today.  06/25/18: POD#1  post left hip revision.  Recurrent sinus pauses reported, up to 2.3 seconds overnight.  Cardiology signed off and will follow-up outpatient.  06/26/18: Seen and examined at his bedside.  No chest pain no dyspnea or palpitations.  RN reports several beats of V. tach, asymptomatic and vital signs stable.  We will continue to monitor.  POD #2 post left hip revision and total hip replacement.  Will work with physical therapy today.  Assessment/Plan: Principal Problem:   Failed total hip arthroplasty with dislocation, initial encounter [facility]) Active Problems:   Hyperlipidemia   Essential hypertension   COPD (chronic obstructive pulmonary disease) (HCC)   Abdominal aortic aneurysm (HCC)   Chronic back pain   Cirrhosis (HCC)   Bladder outlet obstruction   Second degree AV block, Mobitz type I   Aortic stenosis   Opiate dependence (HCC)   Chronic diastolic heart failure (HCC)   CKD (chronic kidney disease) stage 3, GFR 30-59 ml/min (HCC)  Post left hip revision on 06/24/2018 from recurrent left hip dislocation post hemiarthroplasty On presentation self-reported dislocation x3 post hemiarthroplasty Orthopedic surgery consulted and followed POD #2 post left hip revision on 06/24/2018 Pain management in place Follow recommendations per orthopedic surgery  Second-degree AV block Mobitz type I Followed outpatient with Dr. Caryl Comes Cardiology following May require pacemaker Avoid AV nodal blockade agents Continue to closely monitor on telemetry  Recurrent sinus pauses Asymptomatic Up to 2.3-second pauses reported overnight Cardiology following TRH can arrange transfer to [facility] if cardiology decides on pacemaker placement Continue to closely  monitor  Runs of V. tach Asymptomatic Continue to closely monitor on telemetry Follow-up with cardiology outpatient on 07/06/2018  Paroxysmal A. fib with slow ventricular response Chads Vascor 5 Patient would like to wait on starting NOAC  until his next visit with cardiology outpatient on 1/51/7616  Chronic diastolic CHF 2D echio done on 06/23/18 revealed EF>65% Afib on 2D echo Net I&O -3.3L since admission  Severely dialated biatrial w possible PFO/Mild to moderate MR Afib on echo Follow-up with cardiology outpatient on 07/06/2018  Coronary artery disease status post CABG Denies anginal symptoms Continue aspirin and statin  Chronic diastolic CHF Last 2D echo done on 2017 revealed LVEF 65 to 70% with grade 1 diastolic dysfunction Euvolemic No sign of acute exacerbation  COPD Continue as needed albuterol nebs  Cirrhosis Appears compensated Continue Lasix, spironolactone, lactulose  AAA Asymptomatic 3 x 3.2 cm of on eighth 27 2019 Continue surveillance outpatient  Hypertension  -Blood pressure is normotensive -Continue home meds  CKD stage III -SCr is 1.3 on admission -Creatinine 1.36 from 1.28 with GFR 51 on 06/25/2018 -Avoid nephrotoxic agents/dehydration/hypotension -Continue to monitor urine output -Net I&O -3.3 L since admission  BPH Stable Continue tamsulosin and finasteride Continue to monitor urine output   Nutrition: Cardiac and carb modified diet  DVT Prophylaxis: subcutaneous Heparin 3 times daily  Advance goals of care discussion: full code   Consults: orthopedics, cardiology   Family Communication:  None at bedside  Discharge planning: Possibly DC home with home health services tomorrow 06/27/2018 or Monday, 06/28/2018.  We will keep overnight to monitor rhythm and stability of left hip joint.    Objective: Vitals:   06/25/18 2129 06/26/18 0133 06/26/18 0551 06/26/18 0833  BP: (!) 153/52 (!) 115/51 (!) 126/57 (!) 132/54  Pulse: 60 (!) 51 (!) 42 63  Resp: 16 12 14 16   Temp: 97.8 F (36.6 C) 98 F (36.7 C) (!) 97.5 F (36.4 C) 97.7 F (36.5 C)  TempSrc: Oral Oral Oral Oral  SpO2: 99% 96% 99% 99%  Weight:      Height:        Intake/Output Summary (Last 24 hours) at  06/26/2018 0944 Last data filed at 06/26/2018 0737 Gross per 24 hour  Intake 240 ml  Output 2915 ml  Net -2675 ml   Filed Weights   06/22/18 1750  Weight: 74.8 kg    Exam:  . General: 78 y.o. year-old male pleasant well-developed well-nourished in no acute distress.  Alert oriented x3. . Cardiovascular: Irregular rate and rhythm with no rubs or gallops.  No JVD or thyromegaly. [name] Respiratory: Clear to station with no wheezes or rales.  Good inspiratory effort. . Abdomen: Soft nontender nondistended with normal bowel sounds x4 quadrants. . Musculoskeletal: Trace lower extremity edema. 2/4 pulses in all 4 extremities. [name] Psychiatry: Mood is appropriate for condition and setting   Data Reviewed: CBC: Recent Labs  Lab 06/22/18 1241 06/23/18 0408 06/24/18 1359 06/25/18 0339 06/26/18 0405  WBC 7.2 6.5  --  8.3 8.7  NEUTROABS 5.7 4.6  --   --   --   HGB 10.2* 9.7* 9.9* 9.7* 9.1*  HCT 34.0* 33.4* 29.0* 31.9* 30.6*  MCV 87.6 89.1  --  87.6 89.7  PLT 244 209  --  179 106   Basic Metabolic Panel: Recent Labs  Lab 06/22/18 1241 06/23/18 0408 06/24/18 0637 06/24/18 1359 06/25/18 0339 06/26/18 0405  NA 138 142 138 138 136 135  K 3.3* 3.5 3.6 4.0 4.2 4.0  CL 107 112* 107  --  104 103  CO2 24 24 25   --  26 24  GLUCOSE 132* 115* 100* 107* 158* 160*  BUN 34* 27* 26*  --  30* 47*  CREATININE 1.34* 1.25* 1.28*  --  1.36* 1.41*  CALCIUM 8.4* 8.2* 8.6*  --  8.1* 7.9*  MG  --  2.0  --   --   --   --    GFR: Estimated Creatinine Clearance: 47.9 mL/min (A) (by C-G formula based on SCr of 1.41 mg/dL (H)). Liver Function Tests: Recent Labs  Lab 06/23/18 0408  AST 25  ALT 24  ALKPHOS 161*  BILITOT 0.8  PROT 6.3*  ALBUMIN 2.9*   No results for input(s): LIPASE, AMYLASE in the last 168 hours. No results for input(s): AMMONIA in the last 168 hours. Coagulation Profile: No results for input(s): INR, PROTIME in the last 168 hours. Cardiac Enzymes: No results for input(s): CKTOTAL,  CKMB, CKMBINDEX, TROPONINI in the last 168 hours. BNP (last 3 results) No results for input(s): PROBNP in the last 8760 hours. HbA1C: No results for input(s): HGBA1C in the last 72 hours. CBG: No results for input(s): GLUCAP in the last 168 hours. Lipid Profile: No results for input(s): CHOL, HDL, LDLCALC, TRIG, CHOLHDL, LDLDIRECT in the last 72 hours. Thyroid Function Tests: No results for input(s): TSH, T4TOTAL, FREET4, T3FREE, THYROIDAB in the last 72 hours. Anemia Panel: No results for input(s): VITAMINB12, FOLATE, FERRITIN, TIBC, IRON, RETICCTPCT in the last 72 hours. Urine analysis:    Component Value Date/Time   COLORURINE YELLOW 01/21/2017 0450   APPEARANCEUR CLEAR 01/21/2017 0450   LABSPEC 1.011 01/21/2017 0450   PHURINE 5.0 01/21/2017 0450   GLUCOSEU NEGATIVE 01/21/2017 0450   HGBUR NEGATIVE 01/21/2017 0450   BILIRUBINUR 3+ 09/16/2017 1617   KETONESUR NEGATIVE 01/21/2017 0450   PROTEINUR Positive (A) 09/16/2017 1617   PROTEINUR NEGATIVE 01/21/2017 0450   UROBILINOGEN 2.0 (A) 09/16/2017 1617   UROBILINOGEN 1.0 07/28/2013 2005   NITRITE pos 09/16/2017 1617   NITRITE NEGATIVE 01/21/2017 0450   LEUKOCYTESUR Large (3+) (A) 09/16/2017 1617   Sepsis Labs: @LABRCNTIP (procalcitonin:4,lacticidven:4)  ) Recent Results (from the past 240 hour(s))  Surgical pcr screen     Status: None   Collection Time: 06/24/18  7:06 AM  Result Value Ref Range Status   MRSA, PCR NEGATIVE NEGATIVE Final   Staphylococcus aureus NEGATIVE NEGATIVE Final    Comment: (NOTE) The Xpert SA Assay (FDA approved for NASAL specimens in patients 5 years of age and older), is one component of a comprehensive surveillance program. It is not intended to diagnose infection nor to guide or monitor treatment. Performed at [facility], [place] [address], [place], [place] [address]       Studies: No results found.  Scheduled Meds: . aspirin  81 mg Oral Daily  . docusate sodium  100  mg Oral BID  . ferrous sulfate  325 mg Oral TID PC  . finasteride  5 mg Oral Daily  . fluticasone  1 spray Each Nare Daily  . furosemide  80 mg Oral Daily  . heparin  5,000 Units Subcutaneous Q8H  . lidocaine  2 patch Transdermal Q24H  . polyethylene glycol  17 g Oral Daily  . spironolactone  25 mg Oral Daily  . tamsulosin  0.4 mg Oral Daily    Continuous Infusions: . sodium chloride Stopped (06/24/18 1806)  . methocarbamol (ROBAXIN) IV       LOS: 2  days     Kayleen Memos, MD [facility] Pager [phone]  If 7PM-7AM, please contact night-coverage [email or web address] Password [facility] 06/26/2018, 9:44 AM

## 2018-06-26 NOTE — Progress Notes (Signed)
Notified by Central Telemetry that pt had several beats of V tach. Pt is asymptomatic and VSS. Md notified. Continue to monitor. Eulas Post, RN

## 2018-06-26 NOTE — Progress Notes (Signed)
Physical Therapy Treatment Patient Details Name: [name] MRN: [id number] DOB: 18-Jul-1940 Today's Date: 06/26/2018    History of Present Illness 78 yo male admitted with L dislocated hip. S/P L acetabular hip revision 06/24/18. Hx of CP-lift in shoe for leg length discrepancy, COPD, CHF, CABG, falls.    PT Comments    POD # 2 pm session Assisted out of recliner to amb an increased distance in hallway.  General Gait Details: 50% VC's on proper upright posture and proper walker to self distance as well as safety with turns. Assisted back to bed per pt request to nap before 6:30 [employer] vs [employer].  Applied yellow ABD foam pillow.    Follow Up Recommendations  SNF;Home health PT;Supervision/Assistance - 24 hour(pt declines SNF and stated he has "frien" who is a Marine scientist from [place] to [employer] stay with him)     Equipment Recommendations  3in1 (PT)    Recommendations for Other Services       Precautions / Restrictions Precautions Precautions: Posterior Hip;Fall Precaution Booklet Issued: Yes (comment) Precaution Comments: Abduction Pillow in bed/at rest. Pt able to recall 2/3 hip precautions. Reviewed all hip precautions and WBAT status. Restrictions Weight Bearing Restrictions: Yes    Mobility  Bed Mobility Overal bed mobility: Needs Assistance Bed Mobility: Sit to Supine     Supine to sit: Min assist;+2 for safety/equipment;+2 for physical assistance;HOB elevated     General bed mobility comments: assisted back to bed per pt request  Transfers Overall transfer level: Needs assistance Equipment used: Rolling walker (2 wheeled) Transfers: Sit to/from Stand Sit to Stand: Min guard;Supervision         General transfer comment: 50% VC's to avoid hip flex > 90 degrees as pt goes to sit and stand he tends to forward flex his trunk.  Instructed to sit "straight" up and down.  Ambulation/Gait Ambulation/Gait assistance: Min guard Gait Distance (Feet): 75  Feet Assistive device: Rolling walker (2 wheeled) Gait Pattern/deviations: Step-to pattern;Trunk flexed Gait velocity: decreased    General Gait Details: 50% VC's on proper upright posture and proper walker to self distance as well as safety with turns.   Stairs Stairs: Yes Stairs assistance: Min guard;Min assist Stair Management: One rail Left;Sideways;Forwards Number of Stairs: 3 General stair comments: 50% VC's on proper tech and sequencing esp down mild dyspnea with increased distance Hx COPD   Wheelchair Mobility    Modified Rankin (Stroke Patients Only)       Balance                                            Cognition Arousal/Alertness: Awake/alert Behavior During Therapy: WFL for tasks assessed/performed Overall Cognitive Status: Within Functional Limits for tasks assessed                                        Exercises      General Comments        Pertinent Vitals/Pain Pain Assessment: 0-10 Faces Pain Scale: Hurts little more Pain Location: L hip Pain Descriptors / Indicators: Discomfort;Sore Pain Intervention(s): Monitored during session;Premedicated before session;Repositioned;Ice applied    Home Living                      Prior Function  PT Goals (current goals can now be found in the care plan section)      Frequency    Min 5X/week      PT Plan Current plan remains appropriate    Co-evaluation              AM-PAC PT "6 Clicks" Mobility   Outcome Measure  Help needed turning from your back to your side while in a flat bed without using bedrails?: A Little Help needed moving from lying on your back to sitting on the side of a flat bed without using bedrails?: A Little Help needed moving to and from a bed to a chair (including a wheelchair)?: A Little Help needed standing up from a chair using your arms (e.g., wheelchair or bedside chair)?: A Little Help needed to walk in  hospital room?: A Little Help needed climbing 3-5 steps with a railing? : A Little 6 Click Score: 18    End of Session Equipment Utilized During Treatment: Gait belt Activity Tolerance: Patient tolerated treatment well Patient left: in bed;with call bell/phone within reach;with bed alarm set Nurse Communication: Mobility status PT Visit Diagnosis: Unsteadiness on feet (R26.81);Repeated falls (R29.6);History of falling (Z91.81);Other abnormalities of gait and mobility (R26.89);Muscle weakness (generalized) (M62.81);Difficulty in walking, not elsewhere classified (R26.2);Pain Pain - Right/Left: Left Pain - part of body: Hip     Time: 1536-1600 PT Time Calculation (min) (ACUTE ONLY): 24 min  Charges:  $Gait Training: 8-22 mins $Therapeutic Activity: 8-22 mins                     Rica Koyanagi  PTA Acute  Rehabilitation Services Pager      [phone] Office      [phone]

## 2018-06-26 NOTE — Progress Notes (Signed)
Received consult to assess for SNF placement however currently pt planning to return home at DC. Will follow to assist if needs/wishes change.  Sharren Bridge, MSW, LCSW Clinical Social Work 06/26/2018 [phone] weekend coverage for [phone]

## 2018-06-27 LAB — COMPREHENSIVE METABOLIC PANEL WITH GFR
ALT: 19 U/L (ref 0–44)
AST: 25 U/L (ref 15–41)
Albumin: 3.2 g/dL — ABNORMAL LOW (ref 3.5–5.0)
Alkaline Phosphatase: 139 U/L — ABNORMAL HIGH (ref 38–126)
Anion gap: 9 (ref 5–15)
BUN: 47 mg/dL — ABNORMAL HIGH (ref 8–23)
CO2: 25 mmol/L (ref 22–32)
Calcium: 8.2 mg/dL — ABNORMAL LOW (ref 8.9–10.3)
Chloride: 99 mmol/L (ref 98–111)
Creatinine, Ser: 1.39 mg/dL — ABNORMAL HIGH (ref 0.61–1.24)
GFR calc Af Amer: 57 mL/min — ABNORMAL LOW
GFR calc non Af Amer: 49 mL/min — ABNORMAL LOW
Glucose, Bld: 197 mg/dL — ABNORMAL HIGH (ref 70–99)
Potassium: 4.1 mmol/L (ref 3.5–5.1)
Sodium: 133 mmol/L — ABNORMAL LOW (ref 135–145)
Total Bilirubin: 0.9 mg/dL (ref 0.3–1.2)
Total Protein: 6.8 g/dL (ref 6.5–8.1)

## 2018-06-27 LAB — MAGNESIUM: Magnesium: 2.2 mg/dL (ref 1.7–2.4)

## 2018-06-27 LAB — AMMONIA: Ammonia: 10 umol/L (ref 9–35)

## 2018-06-27 MED ORDER — IBUPROFEN 200 MG PO TABS
400.0000 mg | ORAL_TABLET | Freq: Once | ORAL | Status: AC
  Administered 2018-06-27: 400 mg via ORAL
  Filled 2018-06-27: qty 2

## 2018-06-27 MED ORDER — IBUPROFEN 200 MG PO TABS
600.0000 mg | ORAL_TABLET | Freq: Once | ORAL | Status: AC
  Administered 2018-06-27: 600 mg via ORAL
  Filled 2018-06-27: qty 3

## 2018-06-27 NOTE — Progress Notes (Signed)
Occupational Therapy Treatment Patient Details Name: [name] MRN: [id number] DOB: Mar 30, 1941 Today's Date: 06/27/2018    History of present illness 78 yo male admitted with L dislocated hip. S/P L acetabular hip revision 06/24/18. Hx of CP-lift in shoe for leg length discrepancy, COPD, CHF, CABG, falls.   OT comments  Pt progressing toward stated goals. Addressed LB dressing equipment/functional transfers this date. Pt shares he has sock aide at home and has been using it, is having son purchase long handled reacher to get dressed. He shares he has a BSC from previous surgery, but would like an updated one due to what he describes as issues without a splash guard (would urinate onto floor). Pt completing functional t/fs at min guard level this date. Demonstrated how to use long handled dressing equipment, but deferred further practice stating he understands. Pt able to recall precautions with 100% accuracy this date and apply to ADL activities when asked. D/c plan remains appropriate, will continue to follow acutely.    Follow Up Recommendations  Home health OT;Supervision - Intermittent    Equipment Recommendations  3 in 1 bedside commode    Recommendations for Other Services      Precautions / Restrictions Precautions Precautions: Posterior Hip;Fall Precaution Booklet Issued: Yes (comment) Precaution Comments: Abduction Pillow in bed/at rest. Pt able to recall 3/3 hip precautions this date. Reviewed all hip precautions and WBAT status. Restrictions Weight Bearing Restrictions: Yes LLE Weight Bearing: Weight bearing as tolerated       Mobility Bed Mobility               General bed mobility comments: sitting EOB on OT arrival  Transfers Overall transfer level: Needs assistance Equipment used: Rolling walker (2 wheeled) Transfers: Sit to/from Stand Sit to Stand: Min guard         General transfer comment: adhered to precuations with t/f    Balance Overall  balance assessment: Needs assistance Sitting-balance support: Bilateral upper extremity supported;No upper extremity supported Sitting balance-Leahy Scale: Good     Standing balance support: Bilateral upper extremity supported;During functional activity Standing balance-Leahy Scale: Poor Standing balance comment: reliant on external support                           ADL either performed or assessed with clinical judgement   ADL Overall ADL's : Needs assistance/impaired     Grooming: Set up;Sitting       Lower Body Bathing: Sit to/from stand;Cueing for sequencing;Cueing for safety;Cueing for compensatory techniques;Adhering to hip precautions;Minimal assistance Lower Body Bathing Details (indicate cue type and reason): simulated with long handle sponge     Lower Body Dressing: Minimal assistance;Cueing for sequencing;Cueing for compensatory techniques;Adhering to hip precautions;With adaptive equipment Lower Body Dressing Details (indicate cue type and reason): states he has been using sock aide prior to [employer] Transfer: Min Marine scientist Details (indicate cue type and reason): simulated with recliner           General ADL Comments: able to recall all precautions this date, has sock aide and son is Teacher, English as a foreign language     Vision Patient Visual Report: No change from baseline     Perception     Praxis      Cognition Arousal/Alertness: Awake/alert Behavior During Therapy: WFL for tasks assessed/performed Overall Cognitive Status: Within Functional Limits for tasks assessed  Exercises     Shoulder Instructions       General Comments      Pertinent Vitals/ Pain       Pain Assessment: Faces Faces Pain Scale: Hurts little more Pain Location: L hip Pain Descriptors / Indicators: Discomfort;Sore Pain Intervention(s): Limited activity within patient's tolerance;Monitored  during session;Repositioned  Home Living                                          Prior Functioning/Environment              Frequency  Min 2X/week        Progress Toward Goals  OT Goals(current goals can now be found in the care plan section)  Progress towards OT goals: Progressing toward goals  Acute Rehab OT Goals Patient Stated Goal: to go home with son  Plan Discharge plan remains appropriate    Co-evaluation                 AM-PAC OT "6 Clicks" Daily Activity     Outcome Measure   Help from another person eating meals?: None Help from another person taking care of personal grooming?: None Help from another person toileting, which includes using toliet, bedpan, or urinal?: A Little Help from another person bathing (including washing, rinsing, drying)?: A Little Help from another person to put on and taking off regular upper body clothing?: None Help from another person to put on and taking off regular lower body clothing?: A Little 6 Click Score: 21    End of Session Equipment Utilized During Treatment: Rolling walker;Gait belt  OT Visit Diagnosis: Unsteadiness on feet (R26.81);Muscle weakness (generalized) (M62.81)   Activity Tolerance Patient tolerated treatment well   Patient Left in chair;with call bell/phone within reach;with chair alarm set   Nurse Communication          Time: 8315-1761 OT Time Calculation (min): 17 min  Charges: OT General Charges $OT Visit: 1 Visit OT Treatments $Self Care/Home Management : 8-22 mins  Zenovia Jarred, MSOT, OTR/L [place] Office: [phone]  Zenovia Jarred 06/27/2018, 2:23 PM

## 2018-06-27 NOTE — Progress Notes (Signed)
PROGRESS NOTE  [name] NWG:[id number] DOB: [birth year] DOA: 06/22/2018 PCP: [name], MD  HPI/Recap of past 24 hours: [name] is a 78 y.o. male with Past medical history of AAA, CAD S/P CABG, carotid stenosis, CVA, chronic diastolic CHF, cirrhosis of the liver, COPD, HTN, HLD, Mobitz type I AV block. The patient presents with complaints of hip pain. Underwent left hip arthroplasty October 2019.  After that patient was seen in the ER hip on 06/12/2018.  Patient was earlier seen in the morning of 06/22/2018 for hip pain and was found to have dislocation of the hip which was corrected and patient was sent home. Patient comes to the hospital again with dislocation of the left hip with hip pain. Given conscious sedation and reduction was performed in the ER. Patient denies having any trauma, fall, injury.  No fever no chills.  No chest pain abdominal pain at the time of my evaluation. Patient does have chronic back pain as well as shoulder pain. No diarrhea reported patient actually has constipation. No recent change in medications reported as well. Patient tells me that when he saw his primary cardiologist he was informed that he may require a pacemaker in the near future.  Patient reports compliant with all his medication including pain medication.  No alcohol or drug abuse reported by the patient as well.  Patient's hip was reduced in the ER under conscious sedation.  EDP discussed with Dr. Alvan Dame who recommends the patient to be admitted in the hospital.  No further plan available from orthopedic providers.  At his baseline ambulates with assistance And is independent for most of his ADL; manages his medication on his own.  06/23/18: A. fib with slow ventricular response noted on telemetry this morning with history of second-degree AV block Mobitz type I.  Cardiology consulted.  Followed by Dr. Stanford Breed and Dr. Caryl Comes.  06/24/18: Plan for left THR today.  06/25/18: POD#1  post left hip revision.  Recurrent sinus pauses reported, up to 2.3 seconds overnight.  Cardiology signed off and will follow-up outpatient.  06/26/18: No chest pain no dyspnea or palpitations.  Several beats of V. tach, asymptomatic and vital signs stable.  We will continue to monitor.  POD #2 post left hip revision and total hip replacement.  Will work with physical therapy today.  06/27/18: Seen and examined at his bedside.  No acute events overnight.  Denies chest pain or dyspnea at rest.    Reports headaches this morning.  1 dose of ibuprofen given.  Will minimize use of NSAIDs due to CKD 3.  Will repeat labs in the morning.  Assessment/Plan: Principal Problem:   Failed total hip arthroplasty with dislocation, initial encounter [facility]) Active Problems:   Hyperlipidemia   Essential hypertension   COPD (chronic obstructive pulmonary disease) (HCC)   Abdominal aortic aneurysm (HCC)   Chronic back pain   Cirrhosis (HCC)   Bladder outlet obstruction   Second degree AV block, Mobitz type I   Aortic stenosis   Opiate dependence (HCC)   Chronic diastolic heart failure (HCC)   CKD (chronic kidney disease) stage 3, GFR 30-59 ml/min (HCC)  Post left hip revision on 06/24/2018 from recurrent left hip dislocation post hemiarthroplasty On presentation self-reported dislocation x3 post hemiarthroplasty Orthopedic surgery consulted and followed POD #3 post left hip revision on 06/24/2018 Pain management in place Follow recommendations per orthopedic surgery Possible discharge to home with home health PT and OT tomorrow 06/28/2018 Patient declines SNF for  physical rehab DME 3 in 1 bedside commode in place  Second-degree AV block Mobitz type I Followed outpatient with Dr. Caryl Comes Was seen by cardiology inpatient Follow-up with cardiology outpatient on 07/06/2018  Headache Ibuprofen given once Avoid NSAIDs in the setting of CKD 3 Allergic to Tylenol + cirrhosis  Recurrent sinus  pauses Asymptomatic Up to 2.3-second pauses reported overnight Cardiology followed and has since signed off  Runs of V. tach Asymptomatic Continue to closely monitor on telemetry Follow-up with cardiology outpatient on 07/06/2018  Paroxysmal A. fib with slow ventricular response Chads Vascor 5 Patient would like to wait on starting NOAC until his next visit with cardiology outpatient on 07/28/8117  Chronic diastolic CHF 2D echo done on 06/23/18 revealed EF>65% Afib on 2D echo Net I&O -7.5 L since admission Continue Lasix 80 mg daily No sign of acute exacerbation Follow-up with cardiology outpatient  Severely dialated biatrial w possible PFO/Mild to moderate MR Afib on echo Follow-up with cardiology outpatient on 07/06/2018  Coronary artery disease status post CABG Denies anginal symptoms Continue aspirin  Repeat LFTs prior to resuming home dose pravastatin  Hyperlipidemia Repeat LFTs prior to resuming home dose pravastatin  Elevated alkaline phosphatase, possibly related to cirrhosis versus others Repeat LFTs on 06/27/2018  COPD Continue as needed albuterol nebs Resume home inhalers  Cirrhosis Appears compensated Continue Lasix, spironolactone, lactulose Repeat ammonia level on 06/27/2018  AAA Asymptomatic 3 x 3.2 cm of on eighth 27 2019 Continue surveillance outpatient  Hypertension  -Blood pressure is normotensive -Continue home meds -Hold off home po hydralazine due to soft blood pressures  CKD stage III -SCr is 1.3 on admission -Creatinine 1.41 on 06/26/2018  -Repeat chemistry panel on 06/27/2018 -Good urine output with net I&O -7.5 L since admission  BPH Stable Continue tamsulosin and finasteride Continue to monitor urine output   Nutrition: Cardiac and carb modified diet  DVT Prophylaxis: subcutaneous Heparin 3 times daily  Advance goals of care discussion: full code   Consults: orthopedics, cardiology   Family Communication:  None at  bedside  Discharge planning: We will discharge tomorrow 06/28/2018 with home health services PT OT RN and DME 3 in 1 bedside commode.    Objective: Vitals:   06/26/18 2200 06/27/18 0154 06/27/18 0604 06/27/18 0837  BP: (!) 138/52 (!) 131/54 (!) 123/50 (!) 121/49  Pulse: (!) 50 (!) 53 (!) 43 60  Resp: 17 14 14 16   Temp: (!) 97.5 F (36.4 C) (!) 97.5 F (36.4 C) (!) 97.4 F (36.3 C) (!) 97.4 F (36.3 C)  TempSrc: Oral Oral Oral Oral  SpO2: 100% 97% 100% 100%  Weight:      Height:        Intake/Output Summary (Last 24 hours) at 06/27/2018 1478 Last data filed at 06/27/2018 0827 Gross per 24 hour  Intake 840 ml  Output 2650 ml  Net -1810 ml   Filed Weights   06/22/18 1750  Weight: 74.8 kg    Exam:  . General: 78 y.o. year-old male well-developed well-nourished in no acute distress.  Alert oriented x3.   . Cardiovascular: Irregular rhythm with no rubs or gallops.  No JVD or thyromegaly . Respiratory: Clear to auscultation with no wheezes or rales.  Good respiratory effort.  Abdomen: Soft nontender nondistended with normal bowel sounds x4 quadrants. . Musculoskeletal: Trace lower extremity edema. 2/4 pulses in all 4 extremities. [name] Psychiatry: Mood is appropriate for condition and setting   Data Reviewed: CBC: Recent Labs  Lab 06/22/18 1241 06/23/18  0408 06/24/18 1359 06/25/18 0339 06/26/18 0405  WBC 7.2 6.5  --  8.3 8.7  NEUTROABS 5.7 4.6  --   --   --   HGB 10.2* 9.7* 9.9* 9.7* 9.1*  HCT 34.0* 33.4* 29.0* 31.9* 30.6*  MCV 87.6 89.1  --  87.6 89.7  PLT 244 209  --  179 287   Basic Metabolic Panel: Recent Labs  Lab 06/22/18 1241 06/23/18 0408 06/24/18 0637 06/24/18 1359 06/25/18 0339 06/26/18 0405  NA 138 142 138 138 136 135  K 3.3* 3.5 3.6 4.0 4.2 4.0  CL 107 112* 107  --  104 103  CO2 24 24 25   --  26 24  GLUCOSE 132* 115* 100* 107* 158* 160*  BUN 34* 27* 26*  --  30* 47*  CREATININE 1.34* 1.25* 1.28*  --  1.36* 1.41*  CALCIUM 8.4* 8.2* 8.6*  --  8.1*  7.9*  MG  --  2.0  --   --   --   --    GFR: Estimated Creatinine Clearance: 47.9 mL/min (A) (by C-G formula based on SCr of 1.41 mg/dL (H)). Liver Function Tests: Recent Labs  Lab 06/23/18 0408  AST 25  ALT 24  ALKPHOS 161*  BILITOT 0.8  PROT 6.3*  ALBUMIN 2.9*   No results for input(s): LIPASE, AMYLASE in the last 168 hours. No results for input(s): AMMONIA in the last 168 hours. Coagulation Profile: No results for input(s): INR, PROTIME in the last 168 hours. Cardiac Enzymes: No results for input(s): CKTOTAL, CKMB, CKMBINDEX, TROPONINI in the last 168 hours. BNP (last 3 results) No results for input(s): PROBNP in the last 8760 hours. HbA1C: No results for input(s): HGBA1C in the last 72 hours. CBG: No results for input(s): GLUCAP in the last 168 hours. Lipid Profile: No results for input(s): CHOL, HDL, LDLCALC, TRIG, CHOLHDL, LDLDIRECT in the last 72 hours. Thyroid Function Tests: No results for input(s): TSH, T4TOTAL, FREET4, T3FREE, THYROIDAB in the last 72 hours. Anemia Panel: No results for input(s): VITAMINB12, FOLATE, FERRITIN, TIBC, IRON, RETICCTPCT in the last 72 hours. Urine analysis:    Component Value Date/Time   COLORURINE YELLOW 01/21/2017 0450   APPEARANCEUR CLEAR 01/21/2017 0450   LABSPEC 1.011 01/21/2017 0450   PHURINE 5.0 01/21/2017 0450   GLUCOSEU NEGATIVE 01/21/2017 0450   HGBUR NEGATIVE 01/21/2017 0450   BILIRUBINUR 3+ 09/16/2017 1617   KETONESUR NEGATIVE 01/21/2017 0450   PROTEINUR Positive (A) 09/16/2017 1617   PROTEINUR NEGATIVE 01/21/2017 0450   UROBILINOGEN 2.0 (A) 09/16/2017 1617   UROBILINOGEN 1.0 07/28/2013 2005   NITRITE pos 09/16/2017 1617   NITRITE NEGATIVE 01/21/2017 0450   LEUKOCYTESUR Large (3+) (A) 09/16/2017 1617   Sepsis Labs: @LABRCNTIP (procalcitonin:4,lacticidven:4)  ) Recent Results (from the past 240 hour(s))  Surgical pcr screen     Status: None   Collection Time: 06/24/18  7:06 AM  Result Value Ref Range Status    MRSA, PCR NEGATIVE NEGATIVE Final   Staphylococcus aureus NEGATIVE NEGATIVE Final    Comment: (NOTE) The Xpert SA Assay (FDA approved for NASAL specimens in patients 45 years of age and older), is one component of a comprehensive surveillance program. It is not intended to diagnose infection nor to guide or monitor treatment. Performed at [facility], [place] [address], [place], [place] [address]       Studies: No results found.  Scheduled Meds: . aspirin  81 mg Oral Daily  . docusate sodium  100 mg Oral BID  .  ferrous sulfate  325 mg Oral TID PC  . finasteride  5 mg Oral Daily  . fluticasone  1 spray Each Nare Daily  . furosemide  80 mg Oral Daily  . heparin  5,000 Units Subcutaneous Q8H  . ibuprofen  600 mg Oral Once  . lidocaine  2 patch Transdermal Q24H  . polyethylene glycol  17 g Oral Daily  . spironolactone  25 mg Oral Daily  . tamsulosin  0.4 mg Oral Daily    Continuous Infusions: . sodium chloride Stopped (06/24/18 1806)  . methocarbamol (ROBAXIN) IV       LOS: 3 days     Kayleen Memos, MD [facility] Pager [phone]  If 7PM-7AM, please contact night-coverage [email or web address] Password [email or web address] 06/27/2018, 9:22 AM

## 2018-06-27 NOTE — Progress Notes (Signed)
   Subjective:  Patient reports pain as mild.  Doing fair.  He denies any n/v. SOB or CP.  Objective:   VITALS:   Vitals:   06/26/18 2200 06/27/18 0154 06/27/18 0604 06/27/18 0837  BP: (!) 138/52 (!) 131/54 (!) 123/50 (!) 121/49  Pulse: (!) 50 (!) 53 (!) 43 60  Resp: 17 14 14 16   Temp: (!) 97.5 F (36.4 C) (!) 97.5 F (36.4 C) (!) 97.4 F (36.3 C) (!) 97.4 F (36.3 C)  TempSrc: Oral Oral Oral Oral  SpO2: 100% 97% 100% 100%  Weight:      Height:        Neurologically intact Sensation intact distally Intact pulses distally Dorsiflexion/Plantar flexion intact Incision: dressing C/D/I Compartment soft  Flexion contracture noted of hip and knee at his baseline with CP  Lab Results  Component Value Date   WBC 8.7 06/26/2018   HGB 9.1 (L) 06/26/2018   HCT 30.6 (L) 06/26/2018   MCV 89.7 06/26/2018   PLT 180 06/26/2018   BMET    Component Value Date/Time   NA 133 (L) 06/27/2018 0956   K 4.1 06/27/2018 0956   CL 99 06/27/2018 0956   CO2 25 06/27/2018 0956   GLUCOSE 197 (H) 06/27/2018 0956   BUN 47 (H) 06/27/2018 0956   CREATININE 1.39 (H) 06/27/2018 0956   CREATININE 1.72 (H) 04/09/2015 1643   CALCIUM 8.2 (L) 06/27/2018 0956   GFRNONAA 49 (L) 06/27/2018 0956   GFRAA 57 (L) 06/27/2018 0956     Assessment/Plan: 3 Days Post-Op   Principal Problem:   Failed total hip arthroplasty with dislocation, initial encounter ([place]) Active Problems:   Hyperlipidemia   Essential hypertension   COPD (chronic obstructive pulmonary disease) (HCC)   Abdominal aortic aneurysm (HCC)   Chronic back pain   Cirrhosis (HCC)   Bladder outlet obstruction   Second degree AV block, Mobitz type I   Aortic stenosis   Opiate dependence (HCC)   Chronic diastolic heart failure (HCC)   CKD (chronic kidney disease) stage 3, GFR 30-59 ml/min (HCC)   Up with therapy WBAT to LLE Strict Posterior hip precautions, with abduction pillow when in bed to improve resting position  Medical  care per primary Likely dc home tomorrow Follow up with Alvan Dame after dc in 2 weeks.   Nicholes Stairs 06/27/2018, 10:27 AM   Geralynn Rile, MD [phone]

## 2018-06-27 NOTE — Progress Notes (Signed)
Physical Therapy Treatment Patient Details Name: [name] MRN: [id number] DOB: February 21, 1941 Today's Date: 06/27/2018    History of Present Illness 78 yo male admitted with L dislocated hip. S/P L acetabular hip revision 06/24/18. Hx of CP-lift in shoe for leg length discrepancy, COPD, CHF, CABG, falls.    PT Comments    Pt progressing well. Practiced stairs yesterday and feels comfortable with going up stairs when home; reviewed THP, pt verbalizes 3/3, incr gait distance today with minimal DOE. Continue in acute setting. Will benefit from [name], refuses SNF   Follow Up Recommendations  Home health PT;Supervision for mobility/OOB(pt refuses SNF)     Equipment Recommendations  3in1 (PT)    Recommendations for Other Services       Precautions / Restrictions Precautions Precautions: Posterior Hip;Fall Precaution Booklet Issued: Yes (comment) Precaution Comments: Abduction Pillow in bed/at rest. Pt able to recall 3/3 hip precautions this date. Reviewed all hip precautions and WBAT status. Required Braces or Orthoses: Other Brace Other Brace: abduction pillow Restrictions Weight Bearing Restrictions: No LLE Weight Bearing: Weight bearing as tolerated    Mobility  Bed Mobility               General bed mobility comments: in chair on arrival  Transfers Overall transfer level: Needs assistance Equipment used: Rolling walker (2 wheeled) Transfers: Sit to/from Stand Sit to Stand: Supervision;Modified independent (Device/Increase time)         General transfer comment: pt self cues for hand placement, initial cues for L LE position  Ambulation/Gait Ambulation/Gait assistance: Supervision;Min guard Gait Distance (Feet): 200 Feet Assistive device: Rolling walker (2 wheeled) Gait Pattern/deviations: Step-to pattern;Step-through pattern;Trunk flexed Gait velocity: decreased    General Gait Details: verbal cues for trunk extension, avoid twisting with  turns   Stairs             Wheelchair Mobility    Modified Rankin (Stroke Patients Only)       Balance Overall balance assessment: Needs assistance Sitting-balance support: Bilateral upper extremity supported;No upper extremity supported Sitting balance-Leahy Scale: Good     Standing balance support: Bilateral upper extremity supported;During functional activity Standing balance-Leahy Scale: Fair Standing balance comment: able to static stand w/o UE support                            Cognition Arousal/Alertness: Awake/alert Behavior During Therapy: WFL for tasks assessed/performed Overall Cognitive Status: Within Functional Limits for tasks assessed                                        Exercises      General Comments        Pertinent Vitals/Pain Pain Assessment: 0-10 Pain Score: 0-No pain Faces Pain Scale: Hurts little more Pain Location: L hip Pain Descriptors / Indicators: Discomfort;Sore Pain Intervention(s): Monitored during session    Home Living                      Prior Function            PT Goals (current goals can now be found in the care plan section) Acute Rehab PT Goals Patient Stated Goal: to go home with son PT Goal Formulation: With patient Time For Goal Achievement: 07/09/18 Potential to Achieve Goals: Good Progress towards PT goals: Progressing toward goals  Frequency    Min 5X/week      PT Plan Current plan remains appropriate;Discharge plan needs to be updated    Co-evaluation              AM-PAC PT "6 Clicks" Mobility   Outcome Measure  Help needed turning from your back to your side while in a flat bed without using bedrails?: A Little Help needed moving from lying on your back to sitting on the side of a flat bed without using bedrails?: A Little Help needed moving to and from a bed to a chair (including a wheelchair)?: A Little Help needed standing up from a chair  using your arms (e.g., wheelchair or bedside chair)?: None Help needed to walk in hospital room?: A Little Help needed climbing 3-5 steps with a railing? : A Little 6 Click Score: 19    End of Session Equipment Utilized During Treatment: Gait belt Activity Tolerance: Patient tolerated treatment well Patient left: in chair;with call bell/phone within reach;with chair alarm set Nurse Communication: Mobility status PT Visit Diagnosis: Unsteadiness on feet (R26.81);Repeated falls (R29.6);History of falling (Z91.81);Other abnormalities of gait and mobility (R26.89);Muscle weakness (generalized) (M62.81);Difficulty in walking, not elsewhere classified (R26.2)     Time: 2197-5883 PT Time Calculation (min) (ACUTE ONLY): 16 min  Charges:  $Gait Training: 8-22 mins                     [name], PT  Pager: [phone] Acute Rehab Dept [facility]): [phone]   06/27/2018    [facility] 06/27/2018, 2:31 PM

## 2018-06-28 LAB — BPAM RBC
Blood Product Expiration Date: 202004042359
Blood Product Expiration Date: 202004042359
ISSUE DATE / TIME: 202003051336
ISSUE DATE / TIME: 202003051336
Unit Type and Rh: 600
Unit Type and Rh: 600

## 2018-06-28 LAB — TYPE AND SCREEN
ABO/RH(D): A NEG
Antibody Screen: NEGATIVE
Unit division: 0
Unit division: 0

## 2018-06-28 LAB — MAGNESIUM: Magnesium: 2.3 mg/dL (ref 1.7–2.4)

## 2018-06-28 MED ORDER — MUSCLE RUB 10-15 % EX CREA: 1.0000 "application " | TOPICAL_CREAM | Freq: Two times a day (BID) | CUTANEOUS | 0 refills | Status: DC | PRN

## 2018-06-28 MED ORDER — POLYETHYLENE GLYCOL 3350 17 G PO PACK: 17.0000 g | PACK | Freq: Every day | ORAL | 0 refills | Status: DC

## 2018-06-28 MED ORDER — LIDOCAINE 5 % EX PTCH: 2.0000 | MEDICATED_PATCH | CUTANEOUS | 0 refills | Status: DC

## 2018-06-28 MED ORDER — FUROSEMIDE 80 MG PO TABS: 80.0000 mg | ORAL_TABLET | Freq: Every day | ORAL | 0 refills | Status: DC

## 2018-06-28 MED ORDER — LIDOCAINE 5 % EX OINT: TOPICAL_OINTMENT | Freq: Two times a day (BID) | CUTANEOUS | 0 refills | Status: DC | PRN

## 2018-06-28 MED ORDER — PRAVASTATIN SODIUM 20 MG PO TABS: 20.0000 mg | ORAL_TABLET | Freq: Every day | ORAL | Status: DC

## 2018-06-28 MED ORDER — LIDOCAINE 5 % EX OINT
TOPICAL_OINTMENT | Freq: Two times a day (BID) | CUTANEOUS | Status: DC | PRN
  Filled 2018-06-28: qty 35.44

## 2018-06-28 MED ORDER — POTASSIUM CHLORIDE CRYS ER 20 MEQ PO TBCR: 20.0000 meq | EXTENDED_RELEASE_TABLET | Freq: Every day | ORAL | 0 refills | Status: DC

## 2018-06-28 MED ORDER — FERROUS SULFATE 325 (65 FE) MG PO TABS: 325.0000 mg | ORAL_TABLET | Freq: Two times a day (BID) | ORAL | 0 refills | Status: DC

## 2018-06-28 NOTE — Care Management Important Message (Signed)
Important Message  Patient Details  Name: [name] MRN: [id number] Date of Birth: [birth year]   Medicare Important Message Given:  Yes    Jamontae Thwaites 06/28/2018, 8:54 AM

## 2018-06-28 NOTE — Discharge Instructions (Signed)
Hip Dislocation  Hip dislocation is when the bones in your hip joint move out of place. To treat this, your doctor must move your bones back into place (reduction). This condition is an emergency. If you think you have dislocated your hip, do not move. Get medical help right away. Symptoms of a dislocated hip may include:  Very bad pain in your hip area. Pain may get worse when you move or when you try to use your hip to support (bear) your weight.  Not being able to move the hip.  Having the leg of the dislocated hip looking shorter than the other leg.  Inward turning of the foot on the side of the dislocated hip.  Loss of feeling in your lower leg, foot, or ankle. Follow these instructions at home: If you have a splint:  Do not put pressure on any part of the splint until it is fully hardened. This may take many hours.  Wear the splint as told by your doctor. Remove it only as told by your doctor.  Loosen the splint if your toes tingle, get numb, or turn cold and blue.  Do not let your splint get wet if it is not waterproof. ? Do not take baths, swim, or use a hot tub until your doctor says it is okay. Ask your doctor if you can take showers. You may only be allowed to take sponge baths for bathing. ? If your splint is not waterproof, cover it with a watertight plastic bag when you take a bath or a shower.  Keep the splint clean. Managing pain, stiffness, and swelling  If directed, put ice on the injured area: ? Put ice in a plastic bag. ? Place a towel between your skin and the bag. ? Leave the ice on for 20 minutes, 2-3 times a day.  Wear compression stockings or wraps as told by your doctor.  Move your toes often to avoid stiffness and to lessen swelling. Driving  Do not drive or use heavy machinery while taking prescription pain medicine, or as told by your doctor.  Ask your doctor when it is safe to drive if you have a splint on your hip. Activity  Return to your  normal activities as told by your doctor. Ask your doctor what activities are safe for you.  If physical therapy was prescribed, do exercises as told by your doctor. Safety  Do not use your hip to support your body weight until your doctor says that you can. Use crutches or a walker as told by your doctor. General instructions  Do not use any tobacco products, such as cigarettes, chewing tobacco, and e-cigarettes. Tobacco can delay bone healing. If you need help quitting, ask your doctor.  Take over-the-counter and prescription medicines only as told by your doctor.  Keep all follow-up visits as told by your doctor. This is important. How is this prevented?  If you have trouble walking or keeping your balance, try using a cane or a walker. If you feel unstable, sit down right away.  Exercise regularly, as told by your doctor.  Warm up and stretch before being active.  Cool down and stretch after being active. Contact a doctor if:  You have pain that gets worse.  You have pain that does not get better with medicine.  You have swelling in your hip area or your leg.  You have red skin on your hip area or your leg.  You cannot move any part of your  hip or leg.  You feel tingling in any part of your hip, leg, or foot. Get help right away if:  You feel like your hip has dislocated again.  You have very bad pain in your hip or groin.  You have numbness or weakness in your leg. If you have symptoms of a hip dislocation, do not wait to see if the symptoms will go away. Get medical help right away. Call your local emergency services (911 in the [place] Do not drive yourself to the hospital. This information is not intended to replace advice given to you by your health care provider. Make sure you discuss any questions you have with your health care provider. Document Released: 12/05/2010 Document Revised: 09/13/2015 Document Reviewed: 11/07/2014 Elsevier Interactive Patient  Education  [employer].

## 2018-06-28 NOTE — Consult Note (Signed)
   [facility] CM Inpatient Consult   06/28/2018  [name] [birth year] [id number]    Patient screened for potential [facility] services due to unplanned readmission risk score of 36% and 2 hospitalizations within past 6 months.   Went to bedside to speak with [name] to explain [facility]. Mr. Schnorr states that he would like to receive follow up with community RN when he goes home. Denies need for meals needs at this time. States that he will take advantage of [facility] transportation services for medical appointments.   Patient accepts [facility]. Consent obtained. Referral placed for community follow up.  Netta Cedars, MSN, [facility] Liaison Nurse Mobile Phone [phone]  Toll free office [phone]

## 2018-06-28 NOTE — Discharge Summary (Signed)
Discharge Summary  [name] EUM:[id number] DOB: 1941-04-20  PCP: [name], MD  Admit date: 06/22/2018 Discharge date: 06/28/2018  Time spent: 35 minutes  Recommendations for Outpatient Follow-up:  1. Follow-up with orthopedic surgery 2. Follow-up with cardiology 3. Follow-up with your PCP 4. Continue physical therapy 5. Fall precautions 6. Take your medications as prescribed  Discharge Diagnoses:  Active Hospital Problems   Diagnosis Date Noted  . Failed total hip arthroplasty with dislocation, initial encounter ([facility]) 06/22/2018  . Chronic diastolic heart failure ([place]) 03/16/2017  . CKD (chronic kidney disease) stage 3, GFR 30-59 ml/min (H[facility]) 03/16/2017  . Opiate dependence ([place]) 02/13/2017  . Aortic stenosis 03/24/2014  . Second degree AV block, Mobitz type I 09/06/2013  . Cirrhosis ([place]) 07/28/2013  . Bladder outlet obstruction 07/28/2013  . Chronic back pain   . Abdominal aortic aneurysm ([place]) 08/12/2010  . Essential hypertension 11/13/2008  . Hyperlipidemia 11/13/2008  . COPD (chronic obstructive pulmonary disease) ([place]) 11/13/2008    Resolved Hospital Problems  No resolved problems to display.    Discharge Condition: Stable  Diet recommendation: Low-sodium heart healthy diet.  Vitals:   06/27/18 2351 06/28/18 0316  BP: (!) 118/56 125/60  Pulse: (!) 42 (!) 52  Resp:  18  Temp: 97.6 F (36.4 C) 97.9 F (36.6 C)  SpO2: 100% 99%    History of present illness:  [name] a 78 y.o.malewith Past medical history ofAAA, CAD S/P CABG, carotid stenosis, second-degree AV block type I, CVA, chronic diastolic CHF, cirrhosis of the liver, COPD, HTN, HLD, left femoral neck fracture status post repair, who presented with complaints of left hip pain.  He had done well with his left hip for 4 months and then unfortunately had a fall at his home.  Since that fall he has had now 3 episodes of subluxation/dislocation of his left hip joint. TRH  asked to admit.  Orthopedic surgery consulted and followed.  Had a total left hip revision/total hip replacement on 06/24/2018.  Hospital course complicated by A. fib with slow ventricular response and recurrent second-degree AV block Mobitz type I.  Cardiology consulted and followed.  Recommended NO AV BLOCKAGE agents. Will continue to follow-up with cardiology outpatient and possibly start on NOAC.  06/28/18: Patient seen and examined at his bedside.  No acute events overnight.  Vital signs and lab reviewed and are stable.  Heart rate 52 and asymptomatic.  On the day of discharge, the patient was hemodynamically stable.  He will need to follow-up with orthopedic surgery, cardiology, his PCP possible hospitalization.  He will also need to continue physical therapy as recommended by orthopedic surgery.  Patient understands and agrees to plan.  Of note patient has declined SNF and elected to do physical therapy at home.  Hospital Course:  Principal Problem:   Failed total hip arthroplasty with dislocation, initial encounter [facility]) Active Problems:   Hyperlipidemia   Essential hypertension   COPD (chronic obstructive pulmonary disease) (HCC)   Abdominal aortic aneurysm (HCC)   Chronic back pain   Cirrhosis (HCC)   Bladder outlet obstruction   Second degree AV block, Mobitz type I   Aortic stenosis   Opiate dependence (HCC)   Chronic diastolic heart failure (HCC)   CKD (chronic kidney disease) stage 3, GFR 30-59 ml/min (HCC)  Post left hip revision/total hip replacement on 06/24/2018 from recurrent left hip dislocation post hemiarthroplasty On presentation self-reported dislocation x3 post hemiarthroplasty Orthopedic surgery consulted and followed POD #4 post left hip  revision on 06/24/2018 Pain management in place with oxycodone and lidocaine ointment Follow recommendations per orthopedic surgery Patient declines SNF for physical rehab Continue physical therapy at home DME 3 in 1 bedside  commode in place Fall precautions Follow-up with orthopedic surgery outpatient  Second-degree AV block Mobitz type I Followed outpatient with Dr. Caryl Comes Was seen by cardiology inpatient Follow-up with cardiology outpatient on 07/06/2018  Resolved headache Avoid NSAIDs in the setting of CKD 3 Avoid Tylenol in the setting of cirrhosis Follow-up with your primary care provider  Recurrent sinus pauses Asymptomatic Up to 2.3-second pauses reported overnight Cardiology followed and has since signed off Please follow-up with cardiology outpatient  Runs of V. tach Asymptomatic Follow-up with cardiology outpatient on 07/06/2018  Paroxysmal A. fib with slow ventricular response Chads Vascor 5 Patient would like to wait on starting oral anticoagulation until his next visit with cardiology outpatient on 7/37/1062  Chronic diastolic CHF 2D echo done on 06/23/18 revealed EF>65% Afib on 2D echo Net I&O - 10.2 L since admission Continue Lasix 80 mg daily No sign of acute exacerbation Follow-up with cardiology outpatient  Severely dialated biatrial w possible PFO/Mild to moderate MR Afib on echo Follow-up with cardiology outpatient on 07/06/2018  Coronary artery disease status post CABG Denies anginal symptoms Continue aspirin and pravastatin AST ALT normal Alkaline phosphatase mildly elevated at 139 Follow-up with PCP  Hyperlipidemia Continue home dose pravastatin Follow-up with your PCP  Elevated alkaline phosphatase, possibly related to cirrhosis versus others Mildly elevated at 139 Follow-up with PCP  COPD, stable Continue as needed albuterol nebs Resume home inhalers  Cirrhosis Appears compensated Continue Lasix, spironolactone, lactulose Ammonia level normal 10  AAA Asymptomatic Continue surveillance outpatient  Hypertension  -Blood pressure is normotensive -Continue home meds -Hold off home po hydralazine due to soft blood pressures  CKD stage  III -SCr is1.3on admission -Creatinine on 06/27/18 was 1.3 with GFR 49 -Good urine output with net I&O -10.2 L since admission -Follow-up with your PCP  BPH Stable Continue tamsulosin and finasteride Good urine output     Code statusfull code  Consults:orthopedics, cardiology   Discharge Exam: BP 125/60 (BP Location: Left Arm)   Pulse (!) 52   Temp 97.9 F (36.6 C) (Oral)   Resp 18   Ht 5\' 11"  (1.803 m)   Wt 74.8 kg   SpO2 99%   BMI 23.01 kg/m  . General: 78 y.o. year-old male well developed well nourished in no acute distress.  Alert and oriented x3. . Cardiovascular: Regular rate and rhythm with no rubs or gallops.  No thyromegaly or JVD noted.   [name] Respiratory: Clear to auscultation with no wheezes or rales. Good inspiratory effort. . Abdomen: Soft nontender nondistended with normal bowel sounds x4 quadrants. . Musculoskeletal: Trace lower extremity edema. 2/4 pulses in all 4 extremities.  Left hip lateral side covered with surgical dressing with no significant edema and no drainage. [name] Psychiatry: Mood is appropriate for condition and setting  Discharge Instructions You were cared for by a hospitalist during your hospital stay. If you have any questions about your discharge medications or the care you received while you were in the hospital after you are discharged, you can call the unit and asked to speak with the hospitalist on call if the hospitalist that took care of you is not available. Once you are discharged, your primary care physician will handle any further medical issues. Please note that NO REFILLS for any discharge medications will be authorized once  you are discharged, as it is imperative that you return to your primary care physician (or establish a relationship with a primary care physician if you do not have one) for your aftercare needs so that they can reassess your need for medications and monitor your lab values.   Allergies as of 06/28/2018       Reactions   Doxycycline    Amlodipine Nausea And Vomiting, Other (See Comments)   dizziness   Fish Allergy Nausea And Vomiting   STATES HE HAS NOT EATEN ANY FISH INCLUDING SHELLFISH FOR PAST 40 YRS - IT CAUSED NAUSEA   Hydrocodone Itching, Rash   Other Nausea And Vomiting   STATES HE HAS NOT EATEN ANY FISH INCLUDING SHELLFISH FOR PAST 40 YRS - IT CAUSED NAUSEA   Tylenol [acetaminophen] Other (See Comments)   Liver problems      Medication List    STOP taking these medications   ADVIL PM PO   aspirin 81 MG tablet Replaced by:  aspirin 81 MG chewable tablet   hydrALAZINE 10 MG tablet Commonly known as:  APRESOLINE   ibuprofen 200 MG tablet Commonly known as:  ADVIL,MOTRIN   oxyCODONE HCl 30 MG Taba Replaced by:  oxycodone 30 MG immediate release tablet   pregabalin 100 MG capsule Commonly known as:  Lyrica   tiZANidine 2 MG tablet Commonly known as:  ZANAFLEX     TAKE these medications   aspirin 81 MG chewable tablet Commonly known as:  Aspirin Childrens Chew 1 tablet (81 mg total) by mouth 2 (two) times daily for 30 days. Take for 4 weeks, then resume regular dose. Replaces:  aspirin 81 MG tablet   ferrous sulfate 325 (65 FE) MG tablet Take 1 tablet (325 mg total) by mouth 2 (two) times daily with a meal.   finasteride 5 MG tablet Commonly known as:  PROSCAR Take 5 mg by mouth daily.   fluticasone 50 MCG/ACT nasal spray Commonly known as:  FLONASE PLACE 1 SPRAY INTO BOTH NOSTRILS EVERY MORNING. What changed:  See the new instructions.   furosemide 80 MG tablet Commonly known as:  LASIX Take 1 tablet (80 mg total) by mouth daily. What changed:    medication strength  See the new instructions.   Guaifenesin 1200 MG Tb12 Take 1 tablet (1,200 mg total) by mouth 2 (two) times daily.   lactulose 10 GM/15ML solution Commonly known as:  [place] 30 MLS BY MOUTH 2 TIMES DAILY AS NEEDED FOR MILD CONSTIPATION OR MODERATE CONSTIPATION What changed:   See the new instructions.   lidocaine 5 % Commonly known as:  LIDODERM Place 2 patches onto the skin daily. Remove & Discard patch within 12 hours or as directed by MD   lidocaine 5 % ointment Commonly known as:  XYLOCAINE Apply topically 2 (two) times daily as needed for moderate pain.   methocarbamol 500 MG tablet Commonly known as:  ROBAXIN Take 1 tablet (500 mg total) by mouth every 6 (six) hours as needed for muscle spasms.   Muscle Rub 10-15 % Crea Apply 1 application topically 2 (two) times daily as needed for muscle pain.   NARCAN NA Place 1 application into the nose once.   nystatin ointment Commonly known as:  MYCOSTATIN Apply 1 application topically 2 (two) times daily.   oxycodone 30 MG immediate release tablet Commonly known as:  ROXICODONE Take 1 tablet (30 mg total) by mouth 5 (five) times daily as needed for moderate pain or severe pain. Replaces:  oxyCODONE HCl 30 MG Taba   polyethylene glycol packet Commonly known as:  MIRALAX / GLYCOLAX Take 17 g by mouth daily.   potassium chloride SA 20 MEQ tablet Commonly known as:  Klor-Con M20 Take 1 tablet (20 mEq total) by mouth daily. What changed:  See the new instructions.   pravastatin 20 MG tablet Commonly known as:  PRAVACHOL TAKE 1 TABLET BY MOUTH EVERY DAY   sertraline 25 MG tablet Commonly known as:  ZOLOFT TAKE 1 TABLET BY MOUTH EVERYDAY AT BEDTIME What changed:  See the new instructions.   spironolactone 25 MG tablet Commonly known as:  ALDACTONE TAKE 1 TABLET BY MOUTH EVERY DAY   tamsulosin 0.4 MG Caps capsule Commonly known as:  FLOMAX TAKE 1 CAPSULE BY MOUTH EVERY DAY   Ventolin HFA 108 (90 Base) MCG/ACT inhaler Generic drug:  albuterol TAKE 2 PUFFS BY MOUTH EVERY 6 HOURS AS NEEDED FOR WHEEZE What changed:  See the new instructions.            Durable Medical Equipment  (From admission, onward)         Start     Ordered   06/26/18 0652  For home use only DME 3 n 1  Once     Comments:  DME 3-1 bedside comode   06/26/18 0652         Allergies  Allergen Reactions  . Doxycycline   . Amlodipine Nausea And Vomiting and Other (See Comments)    dizziness  . Fish Allergy Nausea And Vomiting    STATES HE HAS NOT EATEN ANY FISH INCLUDING SHELLFISH FOR PAST 40 YRS - IT CAUSED NAUSEA  . Hydrocodone Itching and Rash  . Other Nausea And Vomiting    STATES HE HAS NOT EATEN ANY FISH INCLUDING SHELLFISH FOR PAST 40 YRS - IT CAUSED NAUSEA  . Tylenol [Acetaminophen] Other (See Comments)    Liver problems   Follow-up Information    [name], MD. Schedule an appointment as soon as possible for a visit in 2 weeks.   Specialty:  Orthopedic Surgery Contact information: [address] [place] [address] [phone]        Erlene Quan, PA-C Follow up.   Specialties:  Cardiology, Radiology Why:  Cardiology hospital follow up on 07/06/18 at 11:30. Please arrive 15 minutes early for check in.  Contact information: [address] [place] [address] [phone]        [name], MD. Call in 1 day(s).   Specialty:  Internal Medicine Why:  please call for a post hospital follow up appointment Contact information: [place] [address] [phone]        Lelon Perla, MD .   Specialty:  Cardiology Contact information: [place] [address] [phone]        Deboraha Sprang, MD. Call in 1 day(s).   Specialty:  Cardiology Why:  Please call for a post hospital follow-up appointment. Contact information: [address] [place] [address] [phone]            The results of significant diagnostics from this hospitalization (including imaging, microbiology, ancillary and laboratory) are listed below for reference.    Significant Diagnostic Studies: Dg Pelvis 1-2 Views  Result Date: 06/22/2018 CLINICAL DATA:  Fall 1 week  ago with hip dislocation EXAM: PELVIS - 1-2 VIEW COMPARISON:  None. FINDINGS: There has been dislocation of the femoral  prosthesis from the acetabulum. No definitive fracture is seen. Degenerative changes of lumbar spine are noted. No soft tissue abnormality is seen. IMPRESSION: Dislocation of the femoral prosthesis from the acetabulum. Electronically Signed   By: Inez Catalina M.D.   On: 06/22/2018 14:57   Dg Pelvis Portable  Result Date: 06/24/2018 CLINICAL DATA:  Left acetabular revision EXAM: PORTABLE PELVIS 1-2 VIEWS COMPARISON:  06/22/2018 FINDINGS: Pubic symphysis and rami are intact. Interval revision of left hip replacement with normal alignment and no fracture. Gas in the soft tissues consistent with recent surgery IMPRESSION: Left hip replacement with expected surgical changes Electronically Signed   By: Donavan Foil M.D.   On: 06/24/2018 15:38   Chest Portable 1 View  Result Date: 06/22/2018 CLINICAL DATA:  Failed total hip arthroplasty with dislocation, initial encounter. Preop cardiovascular exam. EXAM: PORTABLE CHEST 1 VIEW COMPARISON:  Chest radiograph 01/26/2018 FINDINGS: Post median sternotomy. Unchanged heart size and mediastinal contours with borderline cardiomegaly. No focal airspace disease, pulmonary edema, pleural effusion or pneumothorax. No acute osseous abnormalities are seen. IMPRESSION: No acute chest findings. Electronically Signed   By: Keith Rake M.D.   On: 06/22/2018 19:13   Dg Hip Unilat W Or Wo Pelvis 1 View Left  Result Date: 06/22/2018 CLINICAL DATA:  Hip pain EXAM: DG HIP (WITH OR WITHOUT PELVIS) 1V*L* COMPARISON:  06/12/2018 FINDINGS: Dislocation of the left femoral prosthesis from the acetabulum with rotated appearance of the femur. No gross fracture allowing for positioning. IMPRESSION: Dislocation of the left femoral prosthesis with rotated appearance of the femur. Electronically Signed   By: Donavan Foil M.D.   On: 06/22/2018 01:49   Dg Hip Port Unilat W  Or Wo Pelvis 1 View Left  Result Date: 06/22/2018 CLINICAL DATA:  Postreduction. EXAM: DG HIP (WITH OR WITHOUT PELVIS) 1V PORT LEFT COMPARISON:  LEFT hip radiograph June 22, 2018 at 0414 hours. FINDINGS: Status post LEFT hip hemiarthroplasty with intact well-seated hardware. No periprosthetic lucency. No fracture deformity or dislocation by single frontal radiograph. Soft tissue planes are non suspicious. IMPRESSION: Status post LEFT hip hemiarthroplasty without fracture deformity or dislocation by single view. Electronically Signed   By: Elon Alas M.D.   On: 06/22/2018 15:52   Dg Hip Port Unilat W Or Wo Pelvis 1 View Left  Result Date: 06/22/2018 CLINICAL DATA:  Hip reduction EXAM: DG HIP (WITH OR WITHOUT PELVIS) 1V PORT LEFT COMPARISON:  Earlier today FINDINGS: A left hip hemiarthroplasty appears reduced. No evident fracture as permitted by limited projection. IMPRESSION: Left hip arthroplasty has been reduced in the frontal projection. Electronically Signed   By: Monte Fantasia M.D.   On: 06/22/2018 04:44   Dg Hip Port Unilat W Or Wo Pelvis 1 View Left  Result Date: 06/12/2018 CLINICAL DATA:  Postreduction left hip EXAM: DG HIP (WITH OR WITHOUT PELVIS) 1V PORT LEFT COMPARISON:  06/12/2018 FINDINGS: Interval reduction of the previously dislocated left hip replacement. Normal AP alignment. No visible fracture. IMPRESSION: Interval reduction.  No visible dislocation or fracture. Electronically Signed   By: Rolm Baptise M.D.   On: 06/12/2018 15:12   Dg Hip Unilat W Or Wo Pelvis 2-3 Views Left  Result Date: 06/12/2018 CLINICAL DATA:  Left hip pain after fall. EXAM: DG HIP (WITH OR WITHOUT PELVIS) 2-3V LEFT COMPARISON:  Pelvic x-ray dated January 26, 2018. FINDINGS: Superior dislocation of the left hip hemiarthroplasty. No definite fracture. The right hip joint space is preserved. The pubic symphysis and sacroiliac joints are intact. IMPRESSION: 1.  Left hip hemiarthroplasty dislocation.  No definite  fracture. Electronically Signed   By: Titus Dubin M.D.   On: 06/12/2018 13:23    Microbiology: Recent Results (from the past 240 hour(s))  Surgical pcr screen     Status: None   Collection Time: 06/24/18  7:06 AM  Result Value Ref Range Status   MRSA, PCR NEGATIVE NEGATIVE Final   Staphylococcus aureus NEGATIVE NEGATIVE Final    Comment: (NOTE) The Xpert SA Assay (FDA approved for NASAL specimens in patients 20 years of age and older), is one component of a comprehensive surveillance program. It is not intended to diagnose infection nor to guide or monitor treatment. Performed at [facility], [place] [address], [place], [place] [address]      Labs: Basic Metabolic Panel: Recent Labs  Lab 06/23/18 0408 06/24/18 6834 06/24/18 1359 06/25/18 0339 06/26/18 0405 06/27/18 0956 06/28/18 0419  NA 142 138 138 136 135 133*  --   K 3.5 3.6 4.0 4.2 4.0 4.1  --   CL 112* 107  --  104 103 99  --   CO2 24 25  --  26 24 25   --   GLUCOSE 115* 100* 107* 158* 160* 197*  --   BUN 27* 26*  --  30* 47* 47*  --   CREATININE 1.25* 1.28*  --  1.36* 1.41* 1.39*  --   CALCIUM 8.2* 8.6*  --  8.1* 7.9* 8.2*  --   MG 2.0  --   --   --   --  2.2 2.3   Liver Function Tests: Recent Labs  Lab 06/23/18 0408 06/27/18 0956  AST 25 25  ALT 24 19  ALKPHOS 161* 139*  BILITOT 0.8 0.9  PROT 6.3* 6.8  ALBUMIN 2.9* 3.2*   No results for input(s): LIPASE, AMYLASE in the last 168 hours. Recent Labs  Lab 06/27/18 0956  AMMONIA 10   CBC: Recent Labs  Lab 06/22/18 1241 06/23/18 0408 06/24/18 1359 06/25/18 0339 06/26/18 0405  WBC 7.2 6.5  --  8.3 8.7  NEUTROABS 5.7 4.6  --   --   --   HGB 10.2* 9.7* 9.9* 9.7* 9.1*  HCT 34.0* 33.4* 29.0* 31.9* 30.6*  MCV 87.6 89.1  --  87.6 89.7  PLT 244 209  --  179 180   Cardiac Enzymes: No results for input(s): CKTOTAL, CKMB, CKMBINDEX, TROPONINI in the last 168 hours. BNP: BNP (last 3 results) No results for input(s): BNP in the  last 8760 hours.  ProBNP (last 3 results) No results for input(s): PROBNP in the last 8760 hours.  CBG: No results for input(s): GLUCAP in the last 168 hours.     Signed:  Kayleen Memos, MD [facility] 06/28/2018, 10:27 AM

## 2018-06-28 NOTE — Care Management Note (Signed)
Case Management Note  Patient Details  Name: [name] MRN: [id number] Date of Birth: 28-Feb-1941  Subjective/Objective:                    Action/Plan:[facility] will follow pt at home for [facility].    Expected Discharge Date:  06/28/18               Expected Discharge Plan:  [place]  In-House Referral:     Discharge planning Services  CM Consult  Post Acute Care Choice:    Choice offered to:     DME Arranged:    DME Agency:     HH Arranged:  RN, PT, Nurse's Aide, OT [place] Agency:  [facility] (now [facility])  Status of Service:  Completed, signed off  If discussed at [place] of Stay Meetings, dates discussed:    Additional CommentsPurcell Mouton, RN 06/28/2018, 2:14 PM

## 2018-06-29 ENCOUNTER — Telehealth: Payer: Self-pay | Admitting: *Deleted

## 2018-06-29 NOTE — Telephone Encounter (Signed)
Called pt to confirm hosp f/u appt that's been made for 07/01/18. Pt states he called this am and made appt. Inform pt had sme additional question concerning discharge. Completed TCM call below.[name]  Transition Care Management Follow-up Telephone Call   Date discharged? 06/28/18   How have you been since you were released from the hospital? Pt states he is doing ok. Still have a little pain   Do you understand why you were in the hospital? YES   Do you understand the discharge instructions? YES   Where were you discharged to? Home   Items Reviewed:  Medications reviewed: YES, pt states there was no changes on his meds   Allergies reviewed: YES  Dietary changes reviewed: YES, heart healthy  Referrals reviewed: YES, he has made appt w/cardiology on 07/06/18   Functional Questionnaire:   Activities of Daily Living (ADLs):   He states he are independent in the following: bathing and hygiene, feeding, continence, grooming and toileting States they require assistance with the following: ambulation and dressing   Any transportation issues/concerns?: NO   Any patient concerns? NO   Confirmed importance and date/time of follow-up visits scheduled YES, appt 07/01/18  Provider Appointment booked with Dr. Sharlet Salina   Confirmed with patient if condition begins to worsen call PCP or go to the ER.  Patient was given the office number and encouraged to call back with question or concerns.  : YES

## 2018-06-30 ENCOUNTER — Telehealth: Payer: Self-pay

## 2018-06-30 ENCOUNTER — Other Ambulatory Visit: Payer: Self-pay

## 2018-06-30 DIAGNOSIS — I480 Paroxysmal atrial fibrillation: Secondary | ICD-10-CM | POA: Diagnosis not present

## 2018-06-30 DIAGNOSIS — H5442A3 Blindness left eye category 3, normal vision right eye: Secondary | ICD-10-CM | POA: Diagnosis not present

## 2018-06-30 DIAGNOSIS — N138 Other obstructive and reflux uropathy: Secondary | ICD-10-CM | POA: Diagnosis not present

## 2018-06-30 DIAGNOSIS — M1612 Unilateral primary osteoarthritis, left hip: Secondary | ICD-10-CM | POA: Diagnosis not present

## 2018-06-30 DIAGNOSIS — N183 Chronic kidney disease, stage 3 (moderate): Secondary | ICD-10-CM | POA: Diagnosis not present

## 2018-06-30 DIAGNOSIS — Z8673 Personal history of transient ischemic attack (TIA), and cerebral infarction without residual deficits: Secondary | ICD-10-CM | POA: Diagnosis not present

## 2018-06-30 DIAGNOSIS — E785 Hyperlipidemia, unspecified: Secondary | ICD-10-CM | POA: Diagnosis not present

## 2018-06-30 DIAGNOSIS — I441 Atrioventricular block, second degree: Secondary | ICD-10-CM | POA: Diagnosis not present

## 2018-06-30 DIAGNOSIS — I130 Hypertensive heart and chronic kidney disease with heart failure and stage 1 through stage 4 chronic kidney disease, or unspecified chronic kidney disease: Secondary | ICD-10-CM | POA: Diagnosis not present

## 2018-06-30 DIAGNOSIS — I714 Abdominal aortic aneurysm, without rupture: Secondary | ICD-10-CM | POA: Diagnosis not present

## 2018-06-30 DIAGNOSIS — Z9181 History of falling: Secondary | ICD-10-CM | POA: Diagnosis not present

## 2018-06-30 DIAGNOSIS — J449 Chronic obstructive pulmonary disease, unspecified: Secondary | ICD-10-CM | POA: Diagnosis not present

## 2018-06-30 DIAGNOSIS — K7460 Unspecified cirrhosis of liver: Secondary | ICD-10-CM | POA: Diagnosis not present

## 2018-06-30 DIAGNOSIS — Z955 Presence of coronary angioplasty implant and graft: Secondary | ICD-10-CM | POA: Diagnosis not present

## 2018-06-30 DIAGNOSIS — I5032 Chronic diastolic (congestive) heart failure: Secondary | ICD-10-CM | POA: Diagnosis not present

## 2018-06-30 DIAGNOSIS — I350 Nonrheumatic aortic (valve) stenosis: Secondary | ICD-10-CM | POA: Diagnosis not present

## 2018-06-30 DIAGNOSIS — Z7982 Long term (current) use of aspirin: Secondary | ICD-10-CM | POA: Diagnosis not present

## 2018-06-30 DIAGNOSIS — T84021D Dislocation of internal left hip prosthesis, subsequent encounter: Secondary | ICD-10-CM | POA: Diagnosis not present

## 2018-06-30 DIAGNOSIS — I2510 Atherosclerotic heart disease of native coronary artery without angina pectoris: Secondary | ICD-10-CM | POA: Diagnosis not present

## 2018-06-30 NOTE — Telephone Encounter (Signed)
Copied from [place] [id number]. Topic: General - Other >> Jun 30, 2018 12:27 PM Ivar Drape wrote: Reason for CRM:   Wilhemena Durie, patient's caretaker [phone] would like to speak to Dr. Sharlet Salina or her medical assistant about the patient's appointments.

## 2018-06-30 NOTE — Telephone Encounter (Signed)
Patient and caregiver was wanting to ask if Dr. Sharlet Salina could take back over prescribing his pain meds as he has a lot of different doctors he has to go to. I informed patient I am unable to make that decision but he can talk to Dr. Sharlet Salina about it at his visit on Friday. Patient stated understanding.

## 2018-06-30 NOTE — Patient Outreach (Signed)
New referral: S/p hip surgery.  Placed call to patient and explained reason for call. Patient seen as inpatient and signed consent.  MD office does transition of care. Reviewed pending appointments with patient and ensured transportation. Patient plans to see primary MD this Friday and caregiver will drive to appointment.   PLAN:offered home visit for 07/05/2018 and patient agreed. Confirmed address.    Confirmed active with home health ([facility])  Tomasa Rand, RN, BSN, [facility] [employer] [phone]

## 2018-07-01 ENCOUNTER — Inpatient Hospital Stay: Payer: Medicare Other | Admitting: Internal Medicine

## 2018-07-01 DIAGNOSIS — N138 Other obstructive and reflux uropathy: Secondary | ICD-10-CM | POA: Diagnosis not present

## 2018-07-01 DIAGNOSIS — Z955 Presence of coronary angioplasty implant and graft: Secondary | ICD-10-CM | POA: Diagnosis not present

## 2018-07-01 DIAGNOSIS — E785 Hyperlipidemia, unspecified: Secondary | ICD-10-CM | POA: Diagnosis not present

## 2018-07-01 DIAGNOSIS — I441 Atrioventricular block, second degree: Secondary | ICD-10-CM | POA: Diagnosis not present

## 2018-07-01 DIAGNOSIS — H5442A3 Blindness left eye category 3, normal vision right eye: Secondary | ICD-10-CM | POA: Diagnosis not present

## 2018-07-01 DIAGNOSIS — I2510 Atherosclerotic heart disease of native coronary artery without angina pectoris: Secondary | ICD-10-CM | POA: Diagnosis not present

## 2018-07-01 DIAGNOSIS — Z9181 History of falling: Secondary | ICD-10-CM | POA: Diagnosis not present

## 2018-07-01 DIAGNOSIS — Z8673 Personal history of transient ischemic attack (TIA), and cerebral infarction without residual deficits: Secondary | ICD-10-CM | POA: Diagnosis not present

## 2018-07-01 DIAGNOSIS — M1612 Unilateral primary osteoarthritis, left hip: Secondary | ICD-10-CM | POA: Diagnosis not present

## 2018-07-01 DIAGNOSIS — I714 Abdominal aortic aneurysm, without rupture: Secondary | ICD-10-CM | POA: Diagnosis not present

## 2018-07-01 DIAGNOSIS — I130 Hypertensive heart and chronic kidney disease with heart failure and stage 1 through stage 4 chronic kidney disease, or unspecified chronic kidney disease: Secondary | ICD-10-CM | POA: Diagnosis not present

## 2018-07-01 DIAGNOSIS — T84021D Dislocation of internal left hip prosthesis, subsequent encounter: Secondary | ICD-10-CM | POA: Diagnosis not present

## 2018-07-01 DIAGNOSIS — I350 Nonrheumatic aortic (valve) stenosis: Secondary | ICD-10-CM | POA: Diagnosis not present

## 2018-07-01 DIAGNOSIS — N183 Chronic kidney disease, stage 3 (moderate): Secondary | ICD-10-CM | POA: Diagnosis not present

## 2018-07-01 DIAGNOSIS — K7460 Unspecified cirrhosis of liver: Secondary | ICD-10-CM | POA: Diagnosis not present

## 2018-07-01 DIAGNOSIS — J449 Chronic obstructive pulmonary disease, unspecified: Secondary | ICD-10-CM | POA: Diagnosis not present

## 2018-07-01 DIAGNOSIS — Z7982 Long term (current) use of aspirin: Secondary | ICD-10-CM | POA: Diagnosis not present

## 2018-07-01 DIAGNOSIS — I5032 Chronic diastolic (congestive) heart failure: Secondary | ICD-10-CM | POA: Diagnosis not present

## 2018-07-01 DIAGNOSIS — I480 Paroxysmal atrial fibrillation: Secondary | ICD-10-CM | POA: Diagnosis not present

## 2018-07-02 ENCOUNTER — Other Ambulatory Visit (INDEPENDENT_AMBULATORY_CARE_PROVIDER_SITE_OTHER): Payer: Medicare Other

## 2018-07-02 ENCOUNTER — Encounter: Payer: Self-pay | Admitting: Internal Medicine

## 2018-07-02 ENCOUNTER — Ambulatory Visit (INDEPENDENT_AMBULATORY_CARE_PROVIDER_SITE_OTHER): Payer: Medicare Other | Admitting: Internal Medicine

## 2018-07-02 ENCOUNTER — Other Ambulatory Visit: Payer: Self-pay

## 2018-07-02 VITALS — BP 146/60 | HR 53 | Temp 97.7°F | Ht 71.0 in | Wt 160.0 lb

## 2018-07-02 DIAGNOSIS — D649 Anemia, unspecified: Secondary | ICD-10-CM

## 2018-07-02 DIAGNOSIS — M7022 Olecranon bursitis, left elbow: Secondary | ICD-10-CM

## 2018-07-02 DIAGNOSIS — S72002A Fracture of unspecified part of neck of left femur, initial encounter for closed fracture: Secondary | ICD-10-CM

## 2018-07-02 LAB — CBC
HCT: 32.8 % — ABNORMAL LOW (ref 39.0–52.0)
Hemoglobin: 10.8 g/dL — ABNORMAL LOW (ref 13.0–17.0)
MCHC: 33 g/dL (ref 30.0–36.0)
MCV: 82.5 fl (ref 78.0–100.0)
Platelets: 292 10*3/uL (ref 150.0–400.0)
RBC: 3.98 Mil/uL — ABNORMAL LOW (ref 4.22–5.81)
RDW: 18.9 % — ABNORMAL HIGH (ref 11.5–15.5)
WBC: 6.8 10*3/uL (ref 4.0–10.5)

## 2018-07-02 LAB — COMPREHENSIVE METABOLIC PANEL WITH GFR
ALT: 21 U/L (ref 0–53)
AST: 22 U/L (ref 0–37)
Albumin: 3.8 g/dL (ref 3.5–5.2)
Alkaline Phosphatase: 167 U/L — ABNORMAL HIGH (ref 39–117)
BUN: 35 mg/dL — ABNORMAL HIGH (ref 6–23)
CO2: 28 meq/L (ref 19–32)
Calcium: 8.7 mg/dL (ref 8.4–10.5)
Chloride: 100 meq/L (ref 96–112)
Creatinine, Ser: 1.42 mg/dL (ref 0.40–1.50)
GFR: 48.52 mL/min — ABNORMAL LOW (ref >60.00)
Glucose, Bld: 136 mg/dL — ABNORMAL HIGH (ref 70–99)
Potassium: 3.9 meq/L (ref 3.5–5.1)
Sodium: 136 meq/L (ref 135–145)
Total Bilirubin: 1.1 mg/dL (ref 0.2–1.2)
Total Protein: 7.4 g/dL (ref 6.0–8.3)

## 2018-07-02 NOTE — Patient Instructions (Addendum)
We have drained the elbow today.   We are checking the labs today to make sure they are the same as before.

## 2018-07-02 NOTE — Assessment & Plan Note (Signed)
Drained about 15 mL bloody fluid from the left elbow after obtaining consent and patient tolerated well.

## 2018-07-02 NOTE — Assessment & Plan Note (Signed)
Recovering from hip surgery and doing PT/OT at home and working with orthopedics.

## 2018-07-02 NOTE — Progress Notes (Signed)
   Subjective:   Patient ID: [name], male    DOB: 1940-09-23, 78 y.o.   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 He is feeling some better but overall still hurting. Denies chest pains or SOB. Denies abdominal pain, diarrhea, constipation. Does have joint pain. Still taking his oxycodone for pain management. Having new pain in the left elbow and swelling since fall. Denies trying anything for that.   PMH, [facility], social history reviewed and updated  Review of Systems  Constitutional: Positive for activity change, appetite change and fatigue.  HENT: Negative.   Eyes: Negative.   Respiratory: Negative for cough, chest tightness and shortness of breath.   Cardiovascular: Negative for chest pain, palpitations and leg swelling.  Gastrointestinal: Negative for abdominal distention, abdominal pain, constipation, diarrhea, nausea and vomiting.  Musculoskeletal: Positive for arthralgias, back pain, gait problem and myalgias.  Skin: Negative.   Neurological: Positive for weakness. Negative for dizziness, tremors, syncope and facial asymmetry.  Psychiatric/Behavioral: Negative.     Objective:  Physical Exam Constitutional:      Appearance: He is well-developed.     Comments: Thin and some temporal wasting  HENT:     Head: Normocephalic and atraumatic.  Neck:     Musculoskeletal: Normal range of motion.  Cardiovascular:     Rate and Rhythm: Regular rhythm.     Comments: Slow rate Pulmonary:     Effort: Pulmonary effort is normal. No respiratory distress.     Breath sounds: Normal breath sounds. No wheezing or rales.  Abdominal:     General: Bowel sounds are normal. There is no distension.     Palpations: Abdomen is soft.     Tenderness: There is no abdominal tenderness. There is no rebound.  Musculoskeletal:         General: Swelling and tenderness present.     Comments: Left olecranon bursa moderately distended with fluid prior to aspiration and drainage, after normal size  Skin:    General: Skin is warm and dry.  Neurological:     Mental Status: He is alert and oriented to person, place, and time.     Coordination: Coordination abnormal.     Comments: walker     Vitals:   07/02/18 1401  BP: (!) 146/60  Pulse: (!) 53  Temp: 97.7 F (36.5 C)  TempSrc: Oral  SpO2: 97%  Weight: 160 lb (72.6 kg)  Height: 5\' 11"  (1.803 m)    Assessment & Plan:

## 2018-07-02 NOTE — Assessment & Plan Note (Signed)
Checking CBC and adjust as needed.  

## 2018-07-02 NOTE — Progress Notes (Signed)
Patient ID: [name], male   DOB: 02-19-1941, 78 y.o.   MRN: [id number] Aspiration/Injection Procedure Note [name] [id number] 10-21-1940  Procedure: Aspiration Left olecranon bursitis Indications: pain, swelling  Procedure Details Consent: Risks of procedure as well as the alternatives and risks of each were explained to the (patient/caregiver).  Consent for procedure obtained. Time Out: Verified patient identification, verified procedure, site/side was marked, verified correct patient position, special equipment/implants available, medications/allergies/relevent history reviewed, required imaging and test results available.  Performed   Local Anesthesia Used:Ethyl Chloride Spray Amount of Fluid Aspirated: 70mL Character of Fluid: bloody Fluid was sent for:not sent A sterile dressing was applied.  Patient did tolerate procedure well. Estimated blood loss: 15 mL  [name] 07/02/2018, 4:36 PM

## 2018-07-05 ENCOUNTER — Other Ambulatory Visit: Payer: Self-pay

## 2018-07-05 NOTE — Patient Outreach (Addendum)
[facility]) Care Management   07/05/2018  [name] [birth year] [id number]  [name] is an 78 y.o. male No in home visit allowed due to [place]. Telephonic initial visit Subjective: Patient reports that he is doing well since his hip replacement revision. Reports living in a trailer and using multiple walkers from room to room. Reports his son and sons family are assisting patient as needed.  Patient reports home health is coming out to see hi.  Patient reports fear of falling and need for strength training.  Reports diagnosed with "slow heart rate" while inpatient. Reports follow up planned with cardiology this week.   Patient voices that he attends a pain clinic.  Patient denies any current needs expect fall prevention. NOTED RED EMMI ALERT TODAY. FOR NO DISCHARGE INSTRUCTIONS. PATIENT REPORTS 12 PAGES OF DISCHARGE INSTRUCTIONS HE HAS AND HAS READ. THIS WAS AN RED EMMI ERROR.  Objective:  Awake and alert over the phone Today's Vitals   07/05/18 1204 07/05/18 1205  Weight: 160 lb (72.6 kg)   Height: 1.778 m (5\' 10" )   PainSc:  0-No pain   Review of Systems  Constitutional: Positive for malaise/fatigue.  HENT: Negative.   Eyes:       Reports wearing reading glasses.  Respiratory: Negative.   Cardiovascular:       Reports left leg swelling from surgery.   Gastrointestinal: Positive for constipation.       Reports constipation related to pain medications  Genitourinary: Negative.   Musculoskeletal: Positive for falls and joint pain.  Skin:       Reports stitches in place.   Neurological: Negative.   Endo/Heme/Allergies: Negative.   Psychiatric/Behavioral: Negative.     Physical Exam  Encounter Medications:   Outpatient Encounter Medications as of 07/05/2018  Medication Sig  . aspirin (ASPIRIN CHILDRENS) 81 MG chewable tablet Chew 1 tablet (81 mg total) by mouth 2 (two) times daily for 30 days. Take for 4 weeks, then resume regular dose.  .  ferrous sulfate 325 (65 FE) MG tablet Take 1 tablet (325 mg total) by mouth 2 (two) times daily with a meal.  . finasteride (PROSCAR) 5 MG tablet Take 5 mg by mouth daily.  . fluticasone (FLONASE) 50 MCG/ACT nasal spray PLACE 1 SPRAY INTO BOTH NOSTRILS EVERY MORNING. (Patient taking differently: Place 1 spray into both nostrils daily. )  . furosemide (LASIX) 80 MG tablet Take 1 tablet (80 mg total) by mouth daily.  . Guaifenesin 1200 MG TB12 Take 1 tablet (1,200 mg total) by mouth 2 (two) times daily.  [name] lactulose (CHRONULAC) 10 GM/15ML solution TAKE 30 MLS BY MOUTH 2 TIMES DAILY AS NEEDED FOR MILD CONSTIPATION OR MODERATE CONSTIPATION (Patient taking differently: Take 20 g by mouth 2 (two) times daily as needed for mild constipation. )  . lidocaine (XYLOCAINE) 5 % ointment Apply topically 2 (two) times daily as needed for moderate pain.  . methocarbamol (ROBAXIN) 500 MG tablet Take 1 tablet (500 mg total) by mouth every 6 (six) hours as needed for muscle spasms.  . Naloxone HCl (NARCAN NA) Place 1 application into the nose once.  . nystatin ointment (MYCOSTATIN) Apply 1 application topically 2 (two) times daily.  [name] oxyCODONE (ROXICODONE) 30 MG immediate release tablet Take 1 tablet (30 mg total) by mouth 5 (five) times daily as needed for moderate pain or severe pain.  . potassium chloride SA (KLOR-CON M20) 20 MEQ tablet Take 1 tablet (20 mEq total) by mouth daily.  [name]  pravastatin (PRAVACHOL) 20 MG tablet TAKE 1 TABLET BY MOUTH EVERY DAY (Patient taking differently: Take 20 mg by mouth daily. )  . sertraline (ZOLOFT) 25 MG tablet TAKE 1 TABLET BY MOUTH EVERYDAY AT BEDTIME (Patient taking differently: Take 25 mg by mouth at bedtime as needed (sleep). )  . spironolactone (ALDACTONE) 25 MG tablet TAKE 1 TABLET BY MOUTH EVERY DAY (Patient taking differently: Take 25 mg by mouth daily. )  . tamsulosin (FLOMAX) 0.4 MG CAPS capsule TAKE 1 CAPSULE BY MOUTH EVERY DAY (Patient taking differently: Take 0.4 mg by  mouth daily. )  . VENTOLIN HFA 108 (90 Base) MCG/ACT inhaler TAKE 2 PUFFS BY MOUTH EVERY 6 HOURS AS NEEDED FOR WHEEZE (Patient taking differently: Inhale 2 puffs into the lungs every 6 (six) hours as needed for wheezing. )  . lidocaine (LIDODERM) 5 % Place 2 patches onto the skin daily. Remove & Discard patch within 12 hours or as directed by MD (Patient not taking: Reported on 07/02/2018)  . Menthol-Methyl Salicylate (MUSCLE RUB) 10-15 % CREA Apply 1 application topically 2 (two) times daily as needed for muscle pain. (Patient not taking: Reported on 07/05/2018)  . polyethylene glycol (MIRALAX / GLYCOLAX) packet Take 17 g by mouth daily. (Patient not taking: Reported on 07/02/2018)   No facility-administered encounter medications on file as of 07/05/2018.     Functional Status:   In your present state of health, do you have any difficulty performing the following activities: 07/05/2018 06/22/2018  Hearing? N -  Vision? Y -  Comment reports needs glasses -  Difficulty concentrating or making decisions? N -  Walking or climbing stairs? Y -  Dressing or bathing? N -  Doing errands, shopping? Y Y  Comment not driving -  Conservation officer, nature and eating ? N -  Using the Toilet? N -  In the past six months, have you accidently leaked urine? N -  Do you have problems with loss of bowel control? N -  Managing your Medications? N -  Managing your Finances? N -  Housekeeping or managing your Housekeeping? N -  Some recent data might be hidden    Fall/Depression Screening:    Fall Risk  07/05/2018 02/15/2018 01/05/2017  Falls in the past year? 1 Yes Yes  Comment - - -  Number falls in past yr: 1 1 2  or more  Injury with Fall? 1 Yes No  Risk for fall due to : History of fall(s) - -  Follow up Falls evaluation completed;Falls prevention discussed - -   PHQ 2/9 Scores 07/05/2018 02/15/2018 01/05/2017 05/15/2016 12/13/2015 10/30/2014 04/11/2013  PHQ - 2 Score 0 0 0 1 0 1 1    Assessment:   (1) reviewed [facility]  program. Noted consent 06/28/2018 on file. Provided my contact information.  (2) no advanced directives. Declines interested in advanced directives (3) attend pain management. (4)  Fall risk:  recent left hip fracture. Using walker (5) pending cardiology follow up  (6) interested in meals on wheels. States another company is assisting with this.   Plan:  (1) consent in medical record. (2) reviewed and encouraged advanced care planning. (3)  Reviewed pain control and importance of communication with MD if pain medication is not working.  (4) Reviewed fall precautions. Encouraged patient to do home exercises as suggested by PT and OT.  Reviewed importance of using walking aid at all times. (5) encouraged patient to attend follow up visit.  (6)will reassess if [facility] needed to help with  meal assistance.  Care planning and goal setting and primary goal is avoid any other falls. This note and barrier letter sent to MD. Successful outreach letter sent to patient.   Next follow up planned for 10 days. THN CM Care Plan Problem One     Most Recent Value  Care Plan Problem One  Recent fall and hip dislocation  Role Documenting the Problem One  Care Management Coordinator  Care Plan for Problem One  Active  THN Long Term Goal   Patient will report no falls in the net 60 days.  THN Long Term Goal Start Date  07/05/18  Interventions for Problem One Long Term Goal  Reviewed assessments. Reviewed fall risk. Encouraged patient to complete exercises daily as suggested by PT and OT.   THN CM Short Term Goal #1   Patient will follow up wiht cardiology as planned in the next 7 days.   THN CM Short Term Goal #1 Start Date  07/05/18  Interventions for Short Term Goal #1  reviewed pending appoitnment and confirmed transportation.   THN CM Short Term Goal #2   Patient will report decreased fear of falling in the next 30 days.   THN CM Short Term Goal #2 Start Date  07/05/18  Interventions for Short Term Goal #2   reviewed fears and encouraged patient to discuss with PT and OT.     Tomasa Rand, RN, BSN, CEN [facility] [employer] [phone]

## 2018-07-06 ENCOUNTER — Ambulatory Visit: Payer: Medicare Other | Admitting: Cardiology

## 2018-07-07 DIAGNOSIS — Z7982 Long term (current) use of aspirin: Secondary | ICD-10-CM | POA: Diagnosis not present

## 2018-07-07 DIAGNOSIS — J449 Chronic obstructive pulmonary disease, unspecified: Secondary | ICD-10-CM | POA: Diagnosis not present

## 2018-07-07 DIAGNOSIS — H5442A3 Blindness left eye category 3, normal vision right eye: Secondary | ICD-10-CM | POA: Diagnosis not present

## 2018-07-07 DIAGNOSIS — K7460 Unspecified cirrhosis of liver: Secondary | ICD-10-CM | POA: Diagnosis not present

## 2018-07-07 DIAGNOSIS — I5032 Chronic diastolic (congestive) heart failure: Secondary | ICD-10-CM | POA: Diagnosis not present

## 2018-07-07 DIAGNOSIS — Z8673 Personal history of transient ischemic attack (TIA), and cerebral infarction without residual deficits: Secondary | ICD-10-CM | POA: Diagnosis not present

## 2018-07-07 DIAGNOSIS — N183 Chronic kidney disease, stage 3 (moderate): Secondary | ICD-10-CM | POA: Diagnosis not present

## 2018-07-07 DIAGNOSIS — I714 Abdominal aortic aneurysm, without rupture: Secondary | ICD-10-CM | POA: Diagnosis not present

## 2018-07-07 DIAGNOSIS — M1612 Unilateral primary osteoarthritis, left hip: Secondary | ICD-10-CM | POA: Diagnosis not present

## 2018-07-07 DIAGNOSIS — N138 Other obstructive and reflux uropathy: Secondary | ICD-10-CM | POA: Diagnosis not present

## 2018-07-07 DIAGNOSIS — I130 Hypertensive heart and chronic kidney disease with heart failure and stage 1 through stage 4 chronic kidney disease, or unspecified chronic kidney disease: Secondary | ICD-10-CM | POA: Diagnosis not present

## 2018-07-07 DIAGNOSIS — Z9181 History of falling: Secondary | ICD-10-CM | POA: Diagnosis not present

## 2018-07-07 DIAGNOSIS — Z955 Presence of coronary angioplasty implant and graft: Secondary | ICD-10-CM | POA: Diagnosis not present

## 2018-07-07 DIAGNOSIS — I480 Paroxysmal atrial fibrillation: Secondary | ICD-10-CM | POA: Diagnosis not present

## 2018-07-07 DIAGNOSIS — I441 Atrioventricular block, second degree: Secondary | ICD-10-CM | POA: Diagnosis not present

## 2018-07-07 DIAGNOSIS — T84021D Dislocation of internal left hip prosthesis, subsequent encounter: Secondary | ICD-10-CM | POA: Diagnosis not present

## 2018-07-07 DIAGNOSIS — I350 Nonrheumatic aortic (valve) stenosis: Secondary | ICD-10-CM | POA: Diagnosis not present

## 2018-07-07 DIAGNOSIS — I2510 Atherosclerotic heart disease of native coronary artery without angina pectoris: Secondary | ICD-10-CM | POA: Diagnosis not present

## 2018-07-07 DIAGNOSIS — E785 Hyperlipidemia, unspecified: Secondary | ICD-10-CM | POA: Diagnosis not present

## 2018-07-09 ENCOUNTER — Ambulatory Visit: Payer: Medicare Other | Admitting: Neurology

## 2018-07-09 DIAGNOSIS — K7460 Unspecified cirrhosis of liver: Secondary | ICD-10-CM | POA: Diagnosis not present

## 2018-07-09 DIAGNOSIS — M1612 Unilateral primary osteoarthritis, left hip: Secondary | ICD-10-CM | POA: Diagnosis not present

## 2018-07-09 DIAGNOSIS — E785 Hyperlipidemia, unspecified: Secondary | ICD-10-CM | POA: Diagnosis not present

## 2018-07-09 DIAGNOSIS — Z955 Presence of coronary angioplasty implant and graft: Secondary | ICD-10-CM | POA: Diagnosis not present

## 2018-07-09 DIAGNOSIS — I714 Abdominal aortic aneurysm, without rupture: Secondary | ICD-10-CM | POA: Diagnosis not present

## 2018-07-09 DIAGNOSIS — I130 Hypertensive heart and chronic kidney disease with heart failure and stage 1 through stage 4 chronic kidney disease, or unspecified chronic kidney disease: Secondary | ICD-10-CM | POA: Diagnosis not present

## 2018-07-09 DIAGNOSIS — I441 Atrioventricular block, second degree: Secondary | ICD-10-CM | POA: Diagnosis not present

## 2018-07-09 DIAGNOSIS — I5032 Chronic diastolic (congestive) heart failure: Secondary | ICD-10-CM | POA: Diagnosis not present

## 2018-07-09 DIAGNOSIS — N183 Chronic kidney disease, stage 3 (moderate): Secondary | ICD-10-CM | POA: Diagnosis not present

## 2018-07-09 DIAGNOSIS — J449 Chronic obstructive pulmonary disease, unspecified: Secondary | ICD-10-CM | POA: Diagnosis not present

## 2018-07-09 DIAGNOSIS — N138 Other obstructive and reflux uropathy: Secondary | ICD-10-CM | POA: Diagnosis not present

## 2018-07-09 DIAGNOSIS — H5442A3 Blindness left eye category 3, normal vision right eye: Secondary | ICD-10-CM | POA: Diagnosis not present

## 2018-07-09 DIAGNOSIS — T84021D Dislocation of internal left hip prosthesis, subsequent encounter: Secondary | ICD-10-CM | POA: Diagnosis not present

## 2018-07-09 DIAGNOSIS — I2510 Atherosclerotic heart disease of native coronary artery without angina pectoris: Secondary | ICD-10-CM | POA: Diagnosis not present

## 2018-07-09 DIAGNOSIS — Z7982 Long term (current) use of aspirin: Secondary | ICD-10-CM | POA: Diagnosis not present

## 2018-07-09 DIAGNOSIS — I350 Nonrheumatic aortic (valve) stenosis: Secondary | ICD-10-CM | POA: Diagnosis not present

## 2018-07-09 DIAGNOSIS — I480 Paroxysmal atrial fibrillation: Secondary | ICD-10-CM | POA: Diagnosis not present

## 2018-07-09 DIAGNOSIS — Z8673 Personal history of transient ischemic attack (TIA), and cerebral infarction without residual deficits: Secondary | ICD-10-CM | POA: Diagnosis not present

## 2018-07-09 DIAGNOSIS — Z9181 History of falling: Secondary | ICD-10-CM | POA: Diagnosis not present

## 2018-07-12 DIAGNOSIS — N138 Other obstructive and reflux uropathy: Secondary | ICD-10-CM | POA: Diagnosis not present

## 2018-07-12 DIAGNOSIS — T84021D Dislocation of internal left hip prosthesis, subsequent encounter: Secondary | ICD-10-CM | POA: Diagnosis not present

## 2018-07-12 DIAGNOSIS — J449 Chronic obstructive pulmonary disease, unspecified: Secondary | ICD-10-CM | POA: Diagnosis not present

## 2018-07-12 DIAGNOSIS — Z8673 Personal history of transient ischemic attack (TIA), and cerebral infarction without residual deficits: Secondary | ICD-10-CM | POA: Diagnosis not present

## 2018-07-12 DIAGNOSIS — I714 Abdominal aortic aneurysm, without rupture: Secondary | ICD-10-CM | POA: Diagnosis not present

## 2018-07-12 DIAGNOSIS — Z7982 Long term (current) use of aspirin: Secondary | ICD-10-CM | POA: Diagnosis not present

## 2018-07-12 DIAGNOSIS — H5442A3 Blindness left eye category 3, normal vision right eye: Secondary | ICD-10-CM | POA: Diagnosis not present

## 2018-07-12 DIAGNOSIS — Z9181 History of falling: Secondary | ICD-10-CM | POA: Diagnosis not present

## 2018-07-12 DIAGNOSIS — I2510 Atherosclerotic heart disease of native coronary artery without angina pectoris: Secondary | ICD-10-CM | POA: Diagnosis not present

## 2018-07-12 DIAGNOSIS — I480 Paroxysmal atrial fibrillation: Secondary | ICD-10-CM | POA: Diagnosis not present

## 2018-07-12 DIAGNOSIS — N183 Chronic kidney disease, stage 3 (moderate): Secondary | ICD-10-CM | POA: Diagnosis not present

## 2018-07-12 DIAGNOSIS — Z955 Presence of coronary angioplasty implant and graft: Secondary | ICD-10-CM | POA: Diagnosis not present

## 2018-07-12 DIAGNOSIS — I5032 Chronic diastolic (congestive) heart failure: Secondary | ICD-10-CM | POA: Diagnosis not present

## 2018-07-12 DIAGNOSIS — E785 Hyperlipidemia, unspecified: Secondary | ICD-10-CM | POA: Diagnosis not present

## 2018-07-12 DIAGNOSIS — K7460 Unspecified cirrhosis of liver: Secondary | ICD-10-CM | POA: Diagnosis not present

## 2018-07-12 DIAGNOSIS — I130 Hypertensive heart and chronic kidney disease with heart failure and stage 1 through stage 4 chronic kidney disease, or unspecified chronic kidney disease: Secondary | ICD-10-CM | POA: Diagnosis not present

## 2018-07-12 DIAGNOSIS — I441 Atrioventricular block, second degree: Secondary | ICD-10-CM | POA: Diagnosis not present

## 2018-07-12 DIAGNOSIS — I350 Nonrheumatic aortic (valve) stenosis: Secondary | ICD-10-CM | POA: Diagnosis not present

## 2018-07-12 DIAGNOSIS — M1612 Unilateral primary osteoarthritis, left hip: Secondary | ICD-10-CM | POA: Diagnosis not present

## 2018-07-14 ENCOUNTER — Encounter: Payer: Self-pay | Admitting: Internal Medicine

## 2018-07-15 ENCOUNTER — Other Ambulatory Visit: Payer: Self-pay

## 2018-07-15 DIAGNOSIS — I441 Atrioventricular block, second degree: Secondary | ICD-10-CM | POA: Diagnosis not present

## 2018-07-15 DIAGNOSIS — Z955 Presence of coronary angioplasty implant and graft: Secondary | ICD-10-CM | POA: Diagnosis not present

## 2018-07-15 DIAGNOSIS — I714 Abdominal aortic aneurysm, without rupture: Secondary | ICD-10-CM | POA: Diagnosis not present

## 2018-07-15 DIAGNOSIS — T84021D Dislocation of internal left hip prosthesis, subsequent encounter: Secondary | ICD-10-CM | POA: Diagnosis not present

## 2018-07-15 DIAGNOSIS — I5032 Chronic diastolic (congestive) heart failure: Secondary | ICD-10-CM | POA: Diagnosis not present

## 2018-07-15 DIAGNOSIS — I130 Hypertensive heart and chronic kidney disease with heart failure and stage 1 through stage 4 chronic kidney disease, or unspecified chronic kidney disease: Secondary | ICD-10-CM | POA: Diagnosis not present

## 2018-07-15 DIAGNOSIS — Z8673 Personal history of transient ischemic attack (TIA), and cerebral infarction without residual deficits: Secondary | ICD-10-CM | POA: Diagnosis not present

## 2018-07-15 DIAGNOSIS — Z7982 Long term (current) use of aspirin: Secondary | ICD-10-CM | POA: Diagnosis not present

## 2018-07-15 DIAGNOSIS — H5442A3 Blindness left eye category 3, normal vision right eye: Secondary | ICD-10-CM | POA: Diagnosis not present

## 2018-07-15 DIAGNOSIS — N183 Chronic kidney disease, stage 3 (moderate): Secondary | ICD-10-CM | POA: Diagnosis not present

## 2018-07-15 DIAGNOSIS — K7460 Unspecified cirrhosis of liver: Secondary | ICD-10-CM | POA: Diagnosis not present

## 2018-07-15 DIAGNOSIS — I480 Paroxysmal atrial fibrillation: Secondary | ICD-10-CM | POA: Diagnosis not present

## 2018-07-15 DIAGNOSIS — J449 Chronic obstructive pulmonary disease, unspecified: Secondary | ICD-10-CM | POA: Diagnosis not present

## 2018-07-15 DIAGNOSIS — Z9181 History of falling: Secondary | ICD-10-CM | POA: Diagnosis not present

## 2018-07-15 DIAGNOSIS — I2510 Atherosclerotic heart disease of native coronary artery without angina pectoris: Secondary | ICD-10-CM | POA: Diagnosis not present

## 2018-07-15 DIAGNOSIS — I350 Nonrheumatic aortic (valve) stenosis: Secondary | ICD-10-CM | POA: Diagnosis not present

## 2018-07-15 DIAGNOSIS — M1612 Unilateral primary osteoarthritis, left hip: Secondary | ICD-10-CM | POA: Diagnosis not present

## 2018-07-15 DIAGNOSIS — N138 Other obstructive and reflux uropathy: Secondary | ICD-10-CM | POA: Diagnosis not present

## 2018-07-15 DIAGNOSIS — E785 Hyperlipidemia, unspecified: Secondary | ICD-10-CM | POA: Diagnosis not present

## 2018-07-15 NOTE — Patient Outreach (Signed)
Telephone assessment:  Placed call to patient with no answer. Mailbox is full message received.  PLAN: will mail outreach letter and attempt again in 3 days.  Tomasa Rand, RN, BSN, CEN [facility] [employer] [phone]

## 2018-07-16 DIAGNOSIS — I130 Hypertensive heart and chronic kidney disease with heart failure and stage 1 through stage 4 chronic kidney disease, or unspecified chronic kidney disease: Secondary | ICD-10-CM | POA: Diagnosis not present

## 2018-07-16 DIAGNOSIS — Z7982 Long term (current) use of aspirin: Secondary | ICD-10-CM | POA: Diagnosis not present

## 2018-07-16 DIAGNOSIS — H5442A3 Blindness left eye category 3, normal vision right eye: Secondary | ICD-10-CM | POA: Diagnosis not present

## 2018-07-16 DIAGNOSIS — N183 Chronic kidney disease, stage 3 (moderate): Secondary | ICD-10-CM | POA: Diagnosis not present

## 2018-07-16 DIAGNOSIS — M1612 Unilateral primary osteoarthritis, left hip: Secondary | ICD-10-CM | POA: Diagnosis not present

## 2018-07-16 DIAGNOSIS — Z955 Presence of coronary angioplasty implant and graft: Secondary | ICD-10-CM | POA: Diagnosis not present

## 2018-07-16 DIAGNOSIS — J449 Chronic obstructive pulmonary disease, unspecified: Secondary | ICD-10-CM | POA: Diagnosis not present

## 2018-07-16 DIAGNOSIS — I441 Atrioventricular block, second degree: Secondary | ICD-10-CM | POA: Diagnosis not present

## 2018-07-16 DIAGNOSIS — I2510 Atherosclerotic heart disease of native coronary artery without angina pectoris: Secondary | ICD-10-CM | POA: Diagnosis not present

## 2018-07-16 DIAGNOSIS — Z8673 Personal history of transient ischemic attack (TIA), and cerebral infarction without residual deficits: Secondary | ICD-10-CM | POA: Diagnosis not present

## 2018-07-16 DIAGNOSIS — K7460 Unspecified cirrhosis of liver: Secondary | ICD-10-CM | POA: Diagnosis not present

## 2018-07-16 DIAGNOSIS — Z9181 History of falling: Secondary | ICD-10-CM | POA: Diagnosis not present

## 2018-07-16 DIAGNOSIS — I5032 Chronic diastolic (congestive) heart failure: Secondary | ICD-10-CM | POA: Diagnosis not present

## 2018-07-16 DIAGNOSIS — I350 Nonrheumatic aortic (valve) stenosis: Secondary | ICD-10-CM | POA: Diagnosis not present

## 2018-07-16 DIAGNOSIS — N138 Other obstructive and reflux uropathy: Secondary | ICD-10-CM | POA: Diagnosis not present

## 2018-07-16 DIAGNOSIS — I714 Abdominal aortic aneurysm, without rupture: Secondary | ICD-10-CM | POA: Diagnosis not present

## 2018-07-16 DIAGNOSIS — E785 Hyperlipidemia, unspecified: Secondary | ICD-10-CM | POA: Diagnosis not present

## 2018-07-16 DIAGNOSIS — T84021D Dislocation of internal left hip prosthesis, subsequent encounter: Secondary | ICD-10-CM | POA: Diagnosis not present

## 2018-07-16 DIAGNOSIS — I480 Paroxysmal atrial fibrillation: Secondary | ICD-10-CM | POA: Diagnosis not present

## 2018-07-19 DIAGNOSIS — I5032 Chronic diastolic (congestive) heart failure: Secondary | ICD-10-CM | POA: Diagnosis not present

## 2018-07-19 DIAGNOSIS — J449 Chronic obstructive pulmonary disease, unspecified: Secondary | ICD-10-CM | POA: Diagnosis not present

## 2018-07-19 DIAGNOSIS — I350 Nonrheumatic aortic (valve) stenosis: Secondary | ICD-10-CM | POA: Diagnosis not present

## 2018-07-19 DIAGNOSIS — Z7982 Long term (current) use of aspirin: Secondary | ICD-10-CM | POA: Diagnosis not present

## 2018-07-19 DIAGNOSIS — I130 Hypertensive heart and chronic kidney disease with heart failure and stage 1 through stage 4 chronic kidney disease, or unspecified chronic kidney disease: Secondary | ICD-10-CM | POA: Diagnosis not present

## 2018-07-19 DIAGNOSIS — N183 Chronic kidney disease, stage 3 (moderate): Secondary | ICD-10-CM | POA: Diagnosis not present

## 2018-07-19 DIAGNOSIS — I441 Atrioventricular block, second degree: Secondary | ICD-10-CM | POA: Diagnosis not present

## 2018-07-19 DIAGNOSIS — I714 Abdominal aortic aneurysm, without rupture: Secondary | ICD-10-CM | POA: Diagnosis not present

## 2018-07-19 DIAGNOSIS — E785 Hyperlipidemia, unspecified: Secondary | ICD-10-CM | POA: Diagnosis not present

## 2018-07-19 DIAGNOSIS — T84021D Dislocation of internal left hip prosthesis, subsequent encounter: Secondary | ICD-10-CM | POA: Diagnosis not present

## 2018-07-19 DIAGNOSIS — I2510 Atherosclerotic heart disease of native coronary artery without angina pectoris: Secondary | ICD-10-CM | POA: Diagnosis not present

## 2018-07-19 DIAGNOSIS — Z955 Presence of coronary angioplasty implant and graft: Secondary | ICD-10-CM | POA: Diagnosis not present

## 2018-07-19 DIAGNOSIS — I480 Paroxysmal atrial fibrillation: Secondary | ICD-10-CM | POA: Diagnosis not present

## 2018-07-19 DIAGNOSIS — H5442A3 Blindness left eye category 3, normal vision right eye: Secondary | ICD-10-CM | POA: Diagnosis not present

## 2018-07-19 DIAGNOSIS — Z8673 Personal history of transient ischemic attack (TIA), and cerebral infarction without residual deficits: Secondary | ICD-10-CM | POA: Diagnosis not present

## 2018-07-19 DIAGNOSIS — M1612 Unilateral primary osteoarthritis, left hip: Secondary | ICD-10-CM | POA: Diagnosis not present

## 2018-07-19 DIAGNOSIS — K7460 Unspecified cirrhosis of liver: Secondary | ICD-10-CM | POA: Diagnosis not present

## 2018-07-19 DIAGNOSIS — Z9181 History of falling: Secondary | ICD-10-CM | POA: Diagnosis not present

## 2018-07-19 DIAGNOSIS — N138 Other obstructive and reflux uropathy: Secondary | ICD-10-CM | POA: Diagnosis not present

## 2018-07-20 ENCOUNTER — Other Ambulatory Visit: Payer: Self-pay

## 2018-07-20 ENCOUNTER — Other Ambulatory Visit: Payer: Self-pay | Admitting: Internal Medicine

## 2018-07-20 NOTE — Telephone Encounter (Signed)
He has a pain medicine doctor and should return to them. Unfortunately with his high daily ME we cannot safely prescribe

## 2018-07-20 NOTE — Telephone Encounter (Signed)
Copied from [place] [id number]. Topic: Quick Communication - Rx Refill/Question >> Jul 20, 2018  2:07 PM Alanda Slim E wrote: Medication: oxyCODONE (ROXICODONE) 30 MG immediate release tablet  - Dr. Aurea Graff pain management ended with Pt today and Estill Bamberg from [place] asked if Dr. Sharlet Salina could take over Pain management and prescribe above medication from Pt having hip replacement surgery.   Has the patient contacted their pharmacy? No  Preferred Pharmacy (with phone number or street name): [facility] - [place], [place]. [phone] (Phone) [phone] (Fax)    Agent: Please be advised that RX refills may take up to 3 business days. We ask that you follow-up with your pharmacy.

## 2018-07-21 ENCOUNTER — Other Ambulatory Visit: Payer: Self-pay

## 2018-07-21 NOTE — Telephone Encounter (Signed)
I called pt- Patient states Dr. Mirna Mires has sent rx  in for patient.

## 2018-07-21 NOTE — Patient Outreach (Signed)
Telephone assessment from yesterday follow up:  Placed call to patient who reports that he called pain clinic after my call to him yesterday. Reports RX called in from pain clinic and he will follow up with pain clinic in 2 weeks via phone assessment.  PLAN:will follow up with patient in 2 weeks.  Tomasa Rand, RN, BSN, CEN [facility] [employer] [phone]

## 2018-07-21 NOTE — Patient Outreach (Signed)
Telephone assessment: 07/20/2018:  Placed call to patient to follow up. Patient reports that he is in severe pain. Reports he has chronic pain and is out of his pain medications. Reports Dr. Alvan Dame referred patient back to pain clinic but clinics are closed due to COVID 19.    Reports he does not know what to do. Reviewed medical record and patient followed up with primary MD 2 weeks ago.   PLAN: encouraged patient to call primary MD to inquire about suggestions, reviewed options for telephone assessments since office visits are discouraged. Will plan to follow up with patient in 24 hours.  Tomasa Rand, RN, BSN, CEN [facility] [employer] [phone]

## 2018-07-22 DIAGNOSIS — Z8673 Personal history of transient ischemic attack (TIA), and cerebral infarction without residual deficits: Secondary | ICD-10-CM | POA: Diagnosis not present

## 2018-07-22 DIAGNOSIS — Z7982 Long term (current) use of aspirin: Secondary | ICD-10-CM | POA: Diagnosis not present

## 2018-07-22 DIAGNOSIS — I130 Hypertensive heart and chronic kidney disease with heart failure and stage 1 through stage 4 chronic kidney disease, or unspecified chronic kidney disease: Secondary | ICD-10-CM | POA: Diagnosis not present

## 2018-07-22 DIAGNOSIS — N138 Other obstructive and reflux uropathy: Secondary | ICD-10-CM | POA: Diagnosis not present

## 2018-07-22 DIAGNOSIS — I2510 Atherosclerotic heart disease of native coronary artery without angina pectoris: Secondary | ICD-10-CM | POA: Diagnosis not present

## 2018-07-22 DIAGNOSIS — I350 Nonrheumatic aortic (valve) stenosis: Secondary | ICD-10-CM | POA: Diagnosis not present

## 2018-07-22 DIAGNOSIS — H5442A3 Blindness left eye category 3, normal vision right eye: Secondary | ICD-10-CM | POA: Diagnosis not present

## 2018-07-22 DIAGNOSIS — T84021D Dislocation of internal left hip prosthesis, subsequent encounter: Secondary | ICD-10-CM | POA: Diagnosis not present

## 2018-07-22 DIAGNOSIS — N183 Chronic kidney disease, stage 3 (moderate): Secondary | ICD-10-CM | POA: Diagnosis not present

## 2018-07-22 DIAGNOSIS — M1612 Unilateral primary osteoarthritis, left hip: Secondary | ICD-10-CM | POA: Diagnosis not present

## 2018-07-22 DIAGNOSIS — E785 Hyperlipidemia, unspecified: Secondary | ICD-10-CM | POA: Diagnosis not present

## 2018-07-22 DIAGNOSIS — Z955 Presence of coronary angioplasty implant and graft: Secondary | ICD-10-CM | POA: Diagnosis not present

## 2018-07-22 DIAGNOSIS — I5032 Chronic diastolic (congestive) heart failure: Secondary | ICD-10-CM | POA: Diagnosis not present

## 2018-07-22 DIAGNOSIS — I714 Abdominal aortic aneurysm, without rupture: Secondary | ICD-10-CM | POA: Diagnosis not present

## 2018-07-22 DIAGNOSIS — I480 Paroxysmal atrial fibrillation: Secondary | ICD-10-CM | POA: Diagnosis not present

## 2018-07-22 DIAGNOSIS — K7460 Unspecified cirrhosis of liver: Secondary | ICD-10-CM | POA: Diagnosis not present

## 2018-07-22 DIAGNOSIS — I441 Atrioventricular block, second degree: Secondary | ICD-10-CM | POA: Diagnosis not present

## 2018-07-22 DIAGNOSIS — Z9181 History of falling: Secondary | ICD-10-CM | POA: Diagnosis not present

## 2018-07-22 DIAGNOSIS — J449 Chronic obstructive pulmonary disease, unspecified: Secondary | ICD-10-CM | POA: Diagnosis not present

## 2018-07-26 ENCOUNTER — Telehealth: Payer: Self-pay

## 2018-07-26 NOTE — Telephone Encounter (Signed)
S/W NEPHEW HE STATES THAT THEY WOULD LIKE A TELEPHONE VISIT. HE WILL FIND OUT AND SEE IF THERE IS A BP CUFF AVAILABLE FOR VISIT AND IF PT HAD MYCHART/EMAIL

## 2018-07-29 NOTE — Telephone Encounter (Signed)
BP YES  scale  YES  MyChart YES  email ?    In the setting of the current Covid19 crisis, you are scheduled for a TELEPHONE visit with your provider on 4-13 at 130.  Just as we do with many in-office visits, in order for you to participate in this visit, we must obtain consent.  If you'd like, I can send this to your mychart (if signed up) or email for you to review.  Otherwise, I can obtain your verbal consent now.  All virtual visits are billed to your insurance company just like a normal visit would be.  By agreeing to a virtual visit, we'd like you to understand that the technology does not allow for your provider to perform an examination, and thus may limit your provider's ability to fully assess your condition.  Finally, though the technology is pretty good, we cannot assure that it will always work on either your or our end, and in the setting of a video visit, we may have to convert it to a phone-only visit.  In either situation, we cannot ensure that we have a secure connection.  Are you willing to proceed?  Verbalized consent.

## 2018-07-29 NOTE — Progress Notes (Addendum)
Virtual Visit via Telephone Note   This visit type was conducted due to national recommendations for restrictions regarding the COVID-19 Pandemic (e.g. social distancing) in an effort to limit this patient's exposure and mitigate transmission in our community.  Due to his co-morbid illnesses, this patient is at least at moderate risk for complications without adequate follow up.  This format is felt to be most appropriate for this patient at this time.  The patient did not have access to video technology/had technical difficulties with video requiring transitioning to audio format only (telephone).  All issues noted in this document were discussed and addressed.  No physical exam could be performed with this format.  Please refer to the patient's chart for his  consent to telehealth for [facility].   Evaluation Performed:  Follow-up visit  This visit type was conducted due to national recommendations for restrictions regarding the COVID-19 Pandemic (e.g. social distancing).  This format is felt to be most appropriate for this patient at this time.  All issues noted in this document were discussed and addressed.  No physical exam was performed (except for noted visual exam findings with Telehealth visits).  See MyChart message from today for the patient's consent to telehealth for [facility].  Patient has given verbal permission to conduct this visit via virtual appointment and to bill insurance 08/02/2018 1:21 PM     Date:  07/29/2018   ID:  [name], DOB [birth year], MRN [id number]  Patient Location:  [place] [address]   Provider location:   [place], [place] office   PCP:  [name], MD  Cardiologist:  Kirk Ruths, MD  Electrophysiologist:  None   Chief Complaint:  F/U   History of Present Illness:    [name] is a 78 y.o. male who presents via audio/video conferencing for a telehealth visit today.  He is being seen post  hospitalization after admission on 06/23/2018 for left hip dislocation. Cardiology was consulted as he was found to be in atrial fibrillation, Mobitz type I AV block, with slow ventricular response.   He has a history of atrial flutter ablation in 12/2014, with Holter monitor 01/2015 showing significant pauses (up to 3.5 seconds), with heart block. This occurred while he was sleeping. Conservative management was recommended. He has hx of CAD, s/p CABG in 7//16/2010 with LIMA to LAD,SVG to OM1, with Y-graft to PL branch and SVG to RCA; carotid stenosis, CHF, AAA, HTN, HL. Other non-cardiac diagnosis in PMH section which includes COPD.   He was taken to surgery, before pre-operative cardiac evaluation, but felt to be a low risk to proceed. He was recommended to not be placed on an AV nodal blocking agents. He had total left hip revision oand replacement on 06/24/2018. He was discharged on 06/28/2018.  He has had hip replacement and is also managed for chronic pain by Dr. Mirna Mires since being seen last.. He has not had any recurrent chest pain or DOE. He uses a walker for ambulation. He is medically compliant.    The patient does not symptoms concerning for COVID-19 infection (fever, chills, cough, or new SHORTNESS OF BREATH).    Prior CV studies:   The following studies were reviewed today: Echocardiogram 06/23/2018 1. The left ventricle has hyperdynamic systolic function, with an ejection fraction of >65%. The cavity size was severely dilated. There is mildly increased left ventricular wall thickness. Left ventricular diastology could not be evaluated secondary to  atrial fibrillation. No  evidence of left ventricular regional wall motion abnormalities.  2. The right ventricle was not well visualized. The cavity was mildly enlarged. There is not assessed.  3. Left atrial size was severely dilated.  4. Right atrial size was severely dilated.  5. The mitral valve is degenerative. Mild thickening of the mitral  valve leaflet. Mitral valve regurgitation is mild to moderate by color flow Doppler.  6. The tricuspid valve is normal in structure.  7. The aortic valve is tricuspid Mild sclerosis of the aortic valve.  8. The aortic root and ascending aorta are normal in size and structure.  9. The inferior vena cava was dilated in size with <50% respiratory variability. 10. No evidence of left ventricular regional wall motion abnormalities. 11. Possible PFO.  SUMMARY   LVEF 65-70%, mild LVH, normal wall motion, severe biatrail enlargement, mild to moderate MR, mild TR, RVSP 46 mmHg, dilated IVC that does not collapse, cannot exclude PFO  Carotid Ultrasound:  12/10/2017 Final Interpretation: Right Carotid: Velocities in the right ICA are consistent with a 40-59%                stenosis. Non-hemodynamically significant plaque <50% noted in                the CCA. The ECA appears >50% stenosed.  Left Carotid: Velocities in the left ICA are consistent with a 1-39% stenosis.               Non-hemodynamically significant plaque noted in the CCA.  Vertebrals:  Bilateral vertebral arteries demonstrate antegrade flow. Subclavians: Normal flow hemodynamics were seen in bilateral subclavian              arteries.   Past Medical History:  Diagnosis Date  . AAA (abdominal aortic aneurysm) ([place])   . Blindness of left eye   . 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 CAD (coronary artery disease)    a. s/p CABG in 10/2008 with LIMA-LAD, SVG-OM1 with Y-graft to PL branch, and SVG-RCA  . Carotid stenosis   . Cerebrovascular disease   . CHF (congestive heart failure) ([place])   . Cirrhosis ([place])    on CT a/p 07/2013, no longer drinking  . CONGENITAL UNSPEC REDUCTION DEFORMITY LOWER LIMB   . Constipation due to opioid therapy 2014   began about a year ago  . COPD (chronic obstructive pulmonary disease) ([place])   . Foley catheter in place   . HTN (hypertension)   . Hyperlipidemia    . 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 Neuromuscular disorder ([place])    PT STATES HE HAS BEEN TOLD HE EITHER HAD POLIO OR CEREBRAL PALSY - STATES HIS LEFT LEG IS SHORTER AND AFFECTS HIS BALANCE - HE WEARS ELEVATED SHOE -LEFT  . Pain    BACK, LEGS AND ARMS  . Shortness of breath    EXERTION  . TOBACCO USE, QUIT    Past Surgical History:  Procedure Laterality Date  . CORONARY ARTERY BYPASS GRAFT  11/03/2008   Ricard Dillon - x4: left internal mammary artery to the distal left anterior descending, saphemous vein graft to the first circumflex marginal branch with a Y graft sequentiallly to a left posterolateral branch, saphenous vein graft to the distal right coronary artery  . ELECTROPHYSIOLOGIC STUDY N/A 01/18/2015   Procedure: A-Flutter Ablation;  Surgeon: Deboraha Sprang, MD;  Location: [facility];  Service:  Cardiovascular;  Laterality: N/A;  . GREEN LIGHT LASER TURP (TRANSURETHRAL RESECTION OF PROSTATE N/A 02/14/2014   Procedure: GREEN LIGHT LASER TURP (TRANSURETHRAL RESECTION OF PROSTATE;  Surgeon: Festus Aloe, MD;  Location: [facility];  Service: Urology;  Laterality: N/A;  . HIP ARTHROPLASTY Left 01/26/2018   Procedure: LEFT HIP POSTERIOR HEMIARTHROPLASTY;  Surgeon: [name], MD;  Location: [facility];  Service: Orthopedics;  Laterality: Left;  . TOTAL HIP REVISION Left 06/24/2018   Procedure: ACETABULAR HIP REVISION POSTERIOR;  Surgeon: [name], MD;  Location: [facility];  Service: Orthopedics;  Laterality: Left;     No outpatient medications have been marked as taking for the 08/02/18 encounter (Appointment) with Lendon Colonel, NP.     Allergies:   Doxycycline; Amlodipine; Fish allergy; Hydrocodone; Other; and Tylenol [acetaminophen]   Social History   Tobacco Use  . Smoking status: Former Research scientist (life sciences)  . Smokeless tobacco: Never Used  Substance Use Topics  . Alcohol use: No    Alcohol/week: 0.0  standard drinks    Comment: History of heavy alcohol use per pt. Quit many years ago1/1/ 2005  . Drug use: No     Family Hx: The patient's family history includes Cancer in his brother; Cirrhosis in his mother; Heart attack in his father; Other in his brother and brother. There is no history of Colon cancer or Stomach cancer.  ROS:   Please see the history of present illness.    All other systems reviewed and are negative.   Labs/Other Tests and Data Reviewed:    Recent Labs: 06/28/2018: Magnesium 2.3 07/02/2018: ALT 21; BUN 35; Creatinine, Ser 1.42; Hemoglobin 10.8; Platelets 292.0; Potassium 3.9; Sodium 136   Recent Lipid Panel Lab Results  Component Value Date/Time   CHOL 125 03/16/2017 12:16 PM   TRIG 96 03/16/2017 12:16 PM   HDL 42 03/16/2017 12:16 PM   CHOLHDL 3.0 03/16/2017 12:16 PM   CHOLHDL 3 12/13/2015 01:58 PM   LDLCALC 64 03/16/2017 12:16 PM    Wt Readings from Last 3 Encounters:  07/05/18 160 lb (72.6 kg)  07/02/18 160 lb (72.6 kg)  06/22/18 165 lb (74.8 kg)     Exam:    Vital Signs:  There were no vitals taken for this visit.   Unable to assess due to phone visit.   ASSESSMENT & PLAN:    1. CAD: Hx of CABG 2010.  Doing well. Not active due to hip replacement. Continue current regimen as he is asymptomatic.  2. Hypertension: Took BP at home with the help of his nephew. Slightly elevated today but he has not had his medications yet. HR was bradycardic at 47 bpm. He is not on any AV nodal blocking agents on review of his medications. He is asymptomatic with this.   3. Hypercholesterolemia; Continues on atorvastatin. He is uncertain if his chronic aches and pains are related to his arthritis vs the statin therapy. He continues to take the atorvastatin. Will need follow up labs on next office visit.   4. Chronic Pain: Is being managed by Dr. Mirna Mires. 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 Pain with some exertion, musculoskeletal.    COVID-19  Education: The signs and symptoms of COVID-19 were discussed with the patient and how to seek care for testing (follow up with PCP or arrange E-visit). The importance of social distancing was discussed today.  Patient Risk:   After full review of this patients clinical status, I feel that they are at least moderate risk at  this time.  Time:   Today, I have spent 15  minutes with the patient with telehealth technology discussing above diagnosis.     Medication Adjustments/Labs and Tests Ordered: Current medicines are reviewed at length with the patient today.  Concerns regarding medicines are outlined above.   Tests Ordered: No orders of the defined types were placed in this encounter.  Medication Changes: No orders of the defined types were placed in this encounter.   Disposition:  Follow up in 4 months  Signed, Phill Myron. West Pugh, ANP, [facility]  07/29/2018 8:28 AM    [facility] [address], [place], [place]  [address] Phone: [phone]; Fax: [phone]

## 2018-07-30 ENCOUNTER — Telehealth: Payer: Self-pay | Admitting: Adult Health

## 2018-07-30 NOTE — Telephone Encounter (Signed)
Smart phone/MyChart/pre reg complete. 07-30-18 ST °

## 2018-08-02 ENCOUNTER — Telehealth (INDEPENDENT_AMBULATORY_CARE_PROVIDER_SITE_OTHER): Payer: Medicare Other | Admitting: Adult Health

## 2018-08-02 VITALS — BP 147/43 | HR 43 | Ht 71.0 in | Wt 155.0 lb

## 2018-08-02 DIAGNOSIS — I2510 Atherosclerotic heart disease of native coronary artery without angina pectoris: Secondary | ICD-10-CM | POA: Diagnosis not present

## 2018-08-02 DIAGNOSIS — I10 Essential (primary) hypertension: Secondary | ICD-10-CM

## 2018-08-02 DIAGNOSIS — I714 Abdominal aortic aneurysm, without rupture, unspecified: Secondary | ICD-10-CM

## 2018-08-02 NOTE — Patient Instructions (Signed)
Follow-Up: You will need a follow up appointment in 4 months.  Please call our office 2 months in advance to schedule this appointment.  PT WOULD LIKE TO KEEP SCHEDULED APPOINTMENT IN June WITH  Kirk Ruths, MD , OTHERWISE HE WILL CALL AND CANCEL TO SCHEDULE IN [place] with one of the following Advanced Practice Providers on your designated Care Team:  Kerin Ransom, [place]  Roby Lofts, PA-C  Sande Rives, [place]       Medication Instructions:  NO CHANGES- Your physician recommends that you continue on your current medications as directed. Please refer to the Current Medication list given to you today. If you need a refill on your cardiac medications before your next appointment, please call your pharmacy. Labwork: When you have labs (blood work) and your tests are completely normal, you will receive your results ONLY by [place] (if you have MyChart) -OR- A paper copy in the mail.  At [facility], you and your health needs are our priority.  As part of our continuing mission to provide you with exceptional heart care, we have created designated Provider Care Teams.  These Care Teams include your primary Cardiologist (physician) and Advanced Practice Providers (APPs -  Physician Assistants and Nurse Practitioners) who all work together to provide you with the care you need, when you need it.  Thank you for choosing [facility] at [facility]!!

## 2018-08-02 NOTE — Progress Notes (Signed)
I went back in put in the diagnosis.    Thanks,  Craig Staggers

## 2018-08-05 ENCOUNTER — Other Ambulatory Visit: Payer: Self-pay

## 2018-08-05 NOTE — Patient Outreach (Signed)
Telephone assessment/ 1 month follow up:  Placed call to patient who reports that he is doing okay. Reports that he continues to deal with his chronic pain. Reports pain from head to toe. Denies significant pain in his hip. Reports pain management clinic continues to manage his pain.  Denies any recent falls.  PLAN: call back in 1 month. Reminded patient to call me sooner if needed. Provided my contact phone number.  THN CM Care Plan Problem One     Most Recent Value  Care Plan Problem One  Recent fall and hip dislocation  Role Documenting the Problem One  Care Management Coordinator  Care Plan for Problem One  Active  THN Long Term Goal   Patient will report no falls in the net 60 days.  THN Long Term Goal Start Date  07/05/18  Interventions for Problem One Long Term Goal  Encouraged patient to continue to do his home exercises to build strength. Reviewed his continued use of his walker.   THN CM Short Term Goal #1   Patient will follow up wiht cardiology as planned in the next 7 days.   THN CM Short Term Goal #1 Start Date  07/05/18  THN CM Short Term Goal #1 Met Date  07/21/18  THN CM Short Term Goal #2   Patient will report decreased fear of falling in the next 30 days.   THN CM Short Term Goal #2 Start Date  07/05/18  [facility] CM Short Term Goal #2 Met Date  08/05/18  Interventions for Short Term Goal #2  REports he is cautious and is dealing wiht his fear of falling.       Tomasa Rand, RN, BSN, CEN [facility] [employer] [phone]

## 2018-08-18 ENCOUNTER — Other Ambulatory Visit: Payer: Self-pay | Admitting: Internal Medicine

## 2018-08-18 DIAGNOSIS — G894 Chronic pain syndrome: Secondary | ICD-10-CM | POA: Diagnosis not present

## 2018-08-18 DIAGNOSIS — M545 Low back pain: Secondary | ICD-10-CM | POA: Diagnosis not present

## 2018-08-18 DIAGNOSIS — I70213 Atherosclerosis of native arteries of extremities with intermittent claudication, bilateral legs: Secondary | ICD-10-CM | POA: Diagnosis not present

## 2018-08-18 DIAGNOSIS — M5137 Other intervertebral disc degeneration, lumbosacral region: Secondary | ICD-10-CM | POA: Diagnosis not present

## 2018-08-18 DIAGNOSIS — I739 Peripheral vascular disease, unspecified: Secondary | ICD-10-CM | POA: Diagnosis not present

## 2018-08-18 DIAGNOSIS — Z79891 Long term (current) use of opiate analgesic: Secondary | ICD-10-CM | POA: Diagnosis not present

## 2018-08-18 DIAGNOSIS — M25551 Pain in right hip: Secondary | ICD-10-CM | POA: Diagnosis not present

## 2018-08-27 DIAGNOSIS — J449 Chronic obstructive pulmonary disease, unspecified: Secondary | ICD-10-CM | POA: Diagnosis not present

## 2018-08-27 DIAGNOSIS — N183 Chronic kidney disease, stage 3 (moderate): Secondary | ICD-10-CM | POA: Diagnosis not present

## 2018-08-27 DIAGNOSIS — T84021D Dislocation of internal left hip prosthesis, subsequent encounter: Secondary | ICD-10-CM | POA: Diagnosis not present

## 2018-08-27 DIAGNOSIS — I350 Nonrheumatic aortic (valve) stenosis: Secondary | ICD-10-CM | POA: Diagnosis not present

## 2018-08-27 DIAGNOSIS — I5032 Chronic diastolic (congestive) heart failure: Secondary | ICD-10-CM | POA: Diagnosis not present

## 2018-08-27 DIAGNOSIS — N138 Other obstructive and reflux uropathy: Secondary | ICD-10-CM | POA: Diagnosis not present

## 2018-08-27 DIAGNOSIS — I2510 Atherosclerotic heart disease of native coronary artery without angina pectoris: Secondary | ICD-10-CM | POA: Diagnosis not present

## 2018-08-27 DIAGNOSIS — I714 Abdominal aortic aneurysm, without rupture: Secondary | ICD-10-CM | POA: Diagnosis not present

## 2018-08-27 DIAGNOSIS — K7460 Unspecified cirrhosis of liver: Secondary | ICD-10-CM | POA: Diagnosis not present

## 2018-08-27 DIAGNOSIS — Z7982 Long term (current) use of aspirin: Secondary | ICD-10-CM | POA: Diagnosis not present

## 2018-08-27 DIAGNOSIS — I480 Paroxysmal atrial fibrillation: Secondary | ICD-10-CM | POA: Diagnosis not present

## 2018-08-27 DIAGNOSIS — I130 Hypertensive heart and chronic kidney disease with heart failure and stage 1 through stage 4 chronic kidney disease, or unspecified chronic kidney disease: Secondary | ICD-10-CM | POA: Diagnosis not present

## 2018-08-27 DIAGNOSIS — Z8673 Personal history of transient ischemic attack (TIA), and cerebral infarction without residual deficits: Secondary | ICD-10-CM | POA: Diagnosis not present

## 2018-08-27 DIAGNOSIS — M1612 Unilateral primary osteoarthritis, left hip: Secondary | ICD-10-CM | POA: Diagnosis not present

## 2018-08-27 DIAGNOSIS — E785 Hyperlipidemia, unspecified: Secondary | ICD-10-CM | POA: Diagnosis not present

## 2018-08-27 DIAGNOSIS — Z955 Presence of coronary angioplasty implant and graft: Secondary | ICD-10-CM | POA: Diagnosis not present

## 2018-08-27 DIAGNOSIS — Z9181 History of falling: Secondary | ICD-10-CM | POA: Diagnosis not present

## 2018-08-27 DIAGNOSIS — I441 Atrioventricular block, second degree: Secondary | ICD-10-CM | POA: Diagnosis not present

## 2018-08-27 DIAGNOSIS — H5442A3 Blindness left eye category 3, normal vision right eye: Secondary | ICD-10-CM | POA: Diagnosis not present

## 2018-09-01 DIAGNOSIS — H5442A3 Blindness left eye category 3, normal vision right eye: Secondary | ICD-10-CM | POA: Diagnosis not present

## 2018-09-01 DIAGNOSIS — Z9181 History of falling: Secondary | ICD-10-CM | POA: Diagnosis not present

## 2018-09-01 DIAGNOSIS — N183 Chronic kidney disease, stage 3 (moderate): Secondary | ICD-10-CM | POA: Diagnosis not present

## 2018-09-01 DIAGNOSIS — I5032 Chronic diastolic (congestive) heart failure: Secondary | ICD-10-CM | POA: Diagnosis not present

## 2018-09-01 DIAGNOSIS — Z7982 Long term (current) use of aspirin: Secondary | ICD-10-CM | POA: Diagnosis not present

## 2018-09-01 DIAGNOSIS — I480 Paroxysmal atrial fibrillation: Secondary | ICD-10-CM | POA: Diagnosis not present

## 2018-09-01 DIAGNOSIS — E785 Hyperlipidemia, unspecified: Secondary | ICD-10-CM | POA: Diagnosis not present

## 2018-09-01 DIAGNOSIS — M1612 Unilateral primary osteoarthritis, left hip: Secondary | ICD-10-CM | POA: Diagnosis not present

## 2018-09-01 DIAGNOSIS — I350 Nonrheumatic aortic (valve) stenosis: Secondary | ICD-10-CM | POA: Diagnosis not present

## 2018-09-01 DIAGNOSIS — N138 Other obstructive and reflux uropathy: Secondary | ICD-10-CM | POA: Diagnosis not present

## 2018-09-01 DIAGNOSIS — J449 Chronic obstructive pulmonary disease, unspecified: Secondary | ICD-10-CM | POA: Diagnosis not present

## 2018-09-01 DIAGNOSIS — I130 Hypertensive heart and chronic kidney disease with heart failure and stage 1 through stage 4 chronic kidney disease, or unspecified chronic kidney disease: Secondary | ICD-10-CM | POA: Diagnosis not present

## 2018-09-01 DIAGNOSIS — I441 Atrioventricular block, second degree: Secondary | ICD-10-CM | POA: Diagnosis not present

## 2018-09-01 DIAGNOSIS — Z8673 Personal history of transient ischemic attack (TIA), and cerebral infarction without residual deficits: Secondary | ICD-10-CM | POA: Diagnosis not present

## 2018-09-01 DIAGNOSIS — T84021D Dislocation of internal left hip prosthesis, subsequent encounter: Secondary | ICD-10-CM | POA: Diagnosis not present

## 2018-09-01 DIAGNOSIS — Z955 Presence of coronary angioplasty implant and graft: Secondary | ICD-10-CM | POA: Diagnosis not present

## 2018-09-01 DIAGNOSIS — I714 Abdominal aortic aneurysm, without rupture: Secondary | ICD-10-CM | POA: Diagnosis not present

## 2018-09-01 DIAGNOSIS — K7460 Unspecified cirrhosis of liver: Secondary | ICD-10-CM | POA: Diagnosis not present

## 2018-09-01 DIAGNOSIS — I2510 Atherosclerotic heart disease of native coronary artery without angina pectoris: Secondary | ICD-10-CM | POA: Diagnosis not present

## 2018-09-06 ENCOUNTER — Encounter: Payer: Self-pay | Admitting: Internal Medicine

## 2018-09-06 ENCOUNTER — Other Ambulatory Visit: Payer: Self-pay

## 2018-09-06 MED ORDER — ALBUTEROL SULFATE HFA 108 (90 BASE) MCG/ACT IN AERS: 2.0000 | INHALATION_SPRAY | Freq: Four times a day (QID) | RESPIRATORY_TRACT | 0 refills | Status: DC | PRN

## 2018-09-06 NOTE — Patient Outreach (Signed)
Case closure:  Placed 60 day follow up call to patient.  Patient reports he is doing okay. Currently complaining of headache and not sleeping well. Reports this is not new for him.  Reports no falls and continues to work with PT. Reports he is walking with walker and wants to walk independent of walker. Reports his legs are weak and not "holding up my body well"   Reports eating 5 meals per day and drinking boost.    Patient reports that he is interested in getting a scooter and he called insurance carrier and was instructed to go through primary MD.  Patient reports that he has sent a message to MD.  Reports he was in the process of this months ago.  PLAN: reviewed current goals and patient has achieved his goals at this time and denies any new goals. Will close case at this time. Will route case closure letter to MD. Will mail case closure to patient. Confirmed address.  THN CM Care Plan Problem One     Most Recent Value  Care Plan Problem One  Recent fall and hip dislocation  Role Documenting the Problem One  Care Management Coordinator  Care Plan for Problem One  Active  THN Long Term Goal   Patient will report no falls in the net 60 days.  THN Long Term Goal Start Date  07/05/18  THN Long Term Goal Met Date  09/06/18  [facility] CM Short Term Goal #1   Patient will follow up wiht cardiology as planned in the next 7 days.   THN CM Short Term Goal #1 Start Date  07/05/18  THN CM Short Term Goal #1 Met Date  07/21/18  THN CM Short Term Goal #2   Patient will report decreased fear of falling in the next 30 days.   THN CM Short Term Goal #2 Start Date  07/05/18  [facility] CM Short Term Goal #2 Met Date  07/21/18       Tomasa Rand, RN, BSN, CEN [place] Coordinator [phone]

## 2018-09-07 DIAGNOSIS — M1612 Unilateral primary osteoarthritis, left hip: Secondary | ICD-10-CM | POA: Diagnosis not present

## 2018-09-07 DIAGNOSIS — I130 Hypertensive heart and chronic kidney disease with heart failure and stage 1 through stage 4 chronic kidney disease, or unspecified chronic kidney disease: Secondary | ICD-10-CM | POA: Diagnosis not present

## 2018-09-07 DIAGNOSIS — N183 Chronic kidney disease, stage 3 (moderate): Secondary | ICD-10-CM | POA: Diagnosis not present

## 2018-09-07 DIAGNOSIS — I714 Abdominal aortic aneurysm, without rupture: Secondary | ICD-10-CM | POA: Diagnosis not present

## 2018-09-07 DIAGNOSIS — I5032 Chronic diastolic (congestive) heart failure: Secondary | ICD-10-CM | POA: Diagnosis not present

## 2018-09-07 DIAGNOSIS — Z955 Presence of coronary angioplasty implant and graft: Secondary | ICD-10-CM | POA: Diagnosis not present

## 2018-09-07 DIAGNOSIS — E785 Hyperlipidemia, unspecified: Secondary | ICD-10-CM | POA: Diagnosis not present

## 2018-09-07 DIAGNOSIS — Z8673 Personal history of transient ischemic attack (TIA), and cerebral infarction without residual deficits: Secondary | ICD-10-CM | POA: Diagnosis not present

## 2018-09-07 DIAGNOSIS — H5442A3 Blindness left eye category 3, normal vision right eye: Secondary | ICD-10-CM | POA: Diagnosis not present

## 2018-09-07 DIAGNOSIS — J449 Chronic obstructive pulmonary disease, unspecified: Secondary | ICD-10-CM | POA: Diagnosis not present

## 2018-09-07 DIAGNOSIS — Z9181 History of falling: Secondary | ICD-10-CM | POA: Diagnosis not present

## 2018-09-07 DIAGNOSIS — I350 Nonrheumatic aortic (valve) stenosis: Secondary | ICD-10-CM | POA: Diagnosis not present

## 2018-09-07 DIAGNOSIS — I2510 Atherosclerotic heart disease of native coronary artery without angina pectoris: Secondary | ICD-10-CM | POA: Diagnosis not present

## 2018-09-07 DIAGNOSIS — I480 Paroxysmal atrial fibrillation: Secondary | ICD-10-CM | POA: Diagnosis not present

## 2018-09-07 DIAGNOSIS — K7460 Unspecified cirrhosis of liver: Secondary | ICD-10-CM | POA: Diagnosis not present

## 2018-09-07 DIAGNOSIS — N138 Other obstructive and reflux uropathy: Secondary | ICD-10-CM | POA: Diagnosis not present

## 2018-09-07 DIAGNOSIS — I441 Atrioventricular block, second degree: Secondary | ICD-10-CM | POA: Diagnosis not present

## 2018-09-07 DIAGNOSIS — T84021D Dislocation of internal left hip prosthesis, subsequent encounter: Secondary | ICD-10-CM | POA: Diagnosis not present

## 2018-09-07 DIAGNOSIS — Z7982 Long term (current) use of aspirin: Secondary | ICD-10-CM | POA: Diagnosis not present

## 2018-09-14 ENCOUNTER — Encounter: Payer: Self-pay | Admitting: Internal Medicine

## 2018-09-14 ENCOUNTER — Telehealth: Payer: Self-pay | Admitting: Internal Medicine

## 2018-09-14 DIAGNOSIS — N183 Chronic kidney disease, stage 3 (moderate): Secondary | ICD-10-CM | POA: Diagnosis not present

## 2018-09-14 DIAGNOSIS — K7460 Unspecified cirrhosis of liver: Secondary | ICD-10-CM | POA: Diagnosis not present

## 2018-09-14 DIAGNOSIS — I350 Nonrheumatic aortic (valve) stenosis: Secondary | ICD-10-CM | POA: Diagnosis not present

## 2018-09-14 DIAGNOSIS — I714 Abdominal aortic aneurysm, without rupture: Secondary | ICD-10-CM | POA: Diagnosis not present

## 2018-09-14 DIAGNOSIS — M1612 Unilateral primary osteoarthritis, left hip: Secondary | ICD-10-CM | POA: Diagnosis not present

## 2018-09-14 DIAGNOSIS — E785 Hyperlipidemia, unspecified: Secondary | ICD-10-CM | POA: Diagnosis not present

## 2018-09-14 DIAGNOSIS — I441 Atrioventricular block, second degree: Secondary | ICD-10-CM | POA: Diagnosis not present

## 2018-09-14 DIAGNOSIS — Z8673 Personal history of transient ischemic attack (TIA), and cerebral infarction without residual deficits: Secondary | ICD-10-CM | POA: Diagnosis not present

## 2018-09-14 DIAGNOSIS — N138 Other obstructive and reflux uropathy: Secondary | ICD-10-CM | POA: Diagnosis not present

## 2018-09-14 DIAGNOSIS — H5442A3 Blindness left eye category 3, normal vision right eye: Secondary | ICD-10-CM | POA: Diagnosis not present

## 2018-09-14 DIAGNOSIS — I130 Hypertensive heart and chronic kidney disease with heart failure and stage 1 through stage 4 chronic kidney disease, or unspecified chronic kidney disease: Secondary | ICD-10-CM | POA: Diagnosis not present

## 2018-09-14 DIAGNOSIS — Z7982 Long term (current) use of aspirin: Secondary | ICD-10-CM | POA: Diagnosis not present

## 2018-09-14 DIAGNOSIS — Z9181 History of falling: Secondary | ICD-10-CM | POA: Diagnosis not present

## 2018-09-14 DIAGNOSIS — I2510 Atherosclerotic heart disease of native coronary artery without angina pectoris: Secondary | ICD-10-CM | POA: Diagnosis not present

## 2018-09-14 DIAGNOSIS — T84021D Dislocation of internal left hip prosthesis, subsequent encounter: Secondary | ICD-10-CM | POA: Diagnosis not present

## 2018-09-14 DIAGNOSIS — I480 Paroxysmal atrial fibrillation: Secondary | ICD-10-CM | POA: Diagnosis not present

## 2018-09-14 DIAGNOSIS — I5032 Chronic diastolic (congestive) heart failure: Secondary | ICD-10-CM | POA: Diagnosis not present

## 2018-09-14 DIAGNOSIS — Z955 Presence of coronary angioplasty implant and graft: Secondary | ICD-10-CM | POA: Diagnosis not present

## 2018-09-14 DIAGNOSIS — J449 Chronic obstructive pulmonary disease, unspecified: Secondary | ICD-10-CM | POA: Diagnosis not present

## 2018-09-14 NOTE — Telephone Encounter (Signed)
That's okay

## 2018-09-14 NOTE — Telephone Encounter (Signed)
Copied from [place] [phone]. Topic: Quick Communication - Home Health Verbal Orders >> Sep 14, 2018  1:39 PM Rutherford Nail, [place] wrote: Caller/Agency: Margaretha Sheffield - [facility] Callback Number: [phone]  Margaretha Sheffield, Physical Therapist with [facility], calling to report a low heart rate. States that the patient's heart rate today at their visit is 56 beats per minute.

## 2018-09-14 NOTE — Telephone Encounter (Signed)
fyi

## 2018-09-15 DIAGNOSIS — I739 Peripheral vascular disease, unspecified: Secondary | ICD-10-CM | POA: Diagnosis not present

## 2018-09-15 DIAGNOSIS — M5137 Other intervertebral disc degeneration, lumbosacral region: Secondary | ICD-10-CM | POA: Diagnosis not present

## 2018-09-15 DIAGNOSIS — M25551 Pain in right hip: Secondary | ICD-10-CM | POA: Diagnosis not present

## 2018-09-15 DIAGNOSIS — G894 Chronic pain syndrome: Secondary | ICD-10-CM | POA: Diagnosis not present

## 2018-09-15 DIAGNOSIS — M545 Low back pain: Secondary | ICD-10-CM | POA: Diagnosis not present

## 2018-09-16 DIAGNOSIS — I5032 Chronic diastolic (congestive) heart failure: Secondary | ICD-10-CM | POA: Diagnosis not present

## 2018-09-16 DIAGNOSIS — H5442A3 Blindness left eye category 3, normal vision right eye: Secondary | ICD-10-CM | POA: Diagnosis not present

## 2018-09-16 DIAGNOSIS — I130 Hypertensive heart and chronic kidney disease with heart failure and stage 1 through stage 4 chronic kidney disease, or unspecified chronic kidney disease: Secondary | ICD-10-CM | POA: Diagnosis not present

## 2018-09-16 DIAGNOSIS — E785 Hyperlipidemia, unspecified: Secondary | ICD-10-CM | POA: Diagnosis not present

## 2018-09-16 DIAGNOSIS — M1612 Unilateral primary osteoarthritis, left hip: Secondary | ICD-10-CM | POA: Diagnosis not present

## 2018-09-16 DIAGNOSIS — I2510 Atherosclerotic heart disease of native coronary artery without angina pectoris: Secondary | ICD-10-CM | POA: Diagnosis not present

## 2018-09-16 DIAGNOSIS — K7460 Unspecified cirrhosis of liver: Secondary | ICD-10-CM | POA: Diagnosis not present

## 2018-09-16 DIAGNOSIS — I714 Abdominal aortic aneurysm, without rupture: Secondary | ICD-10-CM | POA: Diagnosis not present

## 2018-09-16 DIAGNOSIS — Z955 Presence of coronary angioplasty implant and graft: Secondary | ICD-10-CM | POA: Diagnosis not present

## 2018-09-16 DIAGNOSIS — I350 Nonrheumatic aortic (valve) stenosis: Secondary | ICD-10-CM | POA: Diagnosis not present

## 2018-09-16 DIAGNOSIS — N183 Chronic kidney disease, stage 3 (moderate): Secondary | ICD-10-CM | POA: Diagnosis not present

## 2018-09-16 DIAGNOSIS — Z8673 Personal history of transient ischemic attack (TIA), and cerebral infarction without residual deficits: Secondary | ICD-10-CM | POA: Diagnosis not present

## 2018-09-16 DIAGNOSIS — N138 Other obstructive and reflux uropathy: Secondary | ICD-10-CM | POA: Diagnosis not present

## 2018-09-16 DIAGNOSIS — Z7982 Long term (current) use of aspirin: Secondary | ICD-10-CM | POA: Diagnosis not present

## 2018-09-16 DIAGNOSIS — T84021D Dislocation of internal left hip prosthesis, subsequent encounter: Secondary | ICD-10-CM | POA: Diagnosis not present

## 2018-09-16 DIAGNOSIS — J449 Chronic obstructive pulmonary disease, unspecified: Secondary | ICD-10-CM | POA: Diagnosis not present

## 2018-09-16 DIAGNOSIS — I480 Paroxysmal atrial fibrillation: Secondary | ICD-10-CM | POA: Diagnosis not present

## 2018-09-16 DIAGNOSIS — I441 Atrioventricular block, second degree: Secondary | ICD-10-CM | POA: Diagnosis not present

## 2018-09-16 DIAGNOSIS — Z9181 History of falling: Secondary | ICD-10-CM | POA: Diagnosis not present

## 2018-09-18 ENCOUNTER — Emergency Department (HOSPITAL_COMMUNITY): Payer: Medicare Other

## 2018-09-18 ENCOUNTER — Emergency Department (HOSPITAL_COMMUNITY): Payer: Medicare Other | Admitting: Anesthesiology

## 2018-09-18 ENCOUNTER — Other Ambulatory Visit: Payer: Self-pay

## 2018-09-18 ENCOUNTER — Encounter (HOSPITAL_COMMUNITY): Payer: Self-pay | Admitting: Emergency Medicine

## 2018-09-18 ENCOUNTER — Emergency Department (HOSPITAL_COMMUNITY)
Admission: EM | Admit: 2018-09-18 | Discharge: 2018-09-18 | Disposition: A | Discharge status: Still In Hospital | Payer: Medicare Other | Attending: Emergency Medicine | Admitting: Emergency Medicine

## 2018-09-18 ENCOUNTER — Encounter (HOSPITAL_COMMUNITY)
Admission: EM | Disposition: A | Discharge status: Still In Hospital | Payer: Self-pay | Source: Home / Self Care | Attending: Emergency Medicine

## 2018-09-18 DIAGNOSIS — Z951 Presence of aortocoronary bypass graft: Secondary | ICD-10-CM | POA: Diagnosis not present

## 2018-09-18 DIAGNOSIS — Z03818 Encounter for observation for suspected exposure to other biological agents ruled out: Secondary | ICD-10-CM | POA: Diagnosis not present

## 2018-09-18 DIAGNOSIS — E785 Hyperlipidemia, unspecified: Secondary | ICD-10-CM | POA: Diagnosis not present

## 2018-09-18 DIAGNOSIS — N183 Chronic kidney disease, stage 3 (moderate): Secondary | ICD-10-CM | POA: Insufficient documentation

## 2018-09-18 DIAGNOSIS — Z881 Allergy status to other antibiotic agents status: Secondary | ICD-10-CM | POA: Insufficient documentation

## 2018-09-18 DIAGNOSIS — I959 Hypotension, unspecified: Secondary | ICD-10-CM | POA: Diagnosis not present

## 2018-09-18 DIAGNOSIS — S73006A Unspecified dislocation of unspecified hip, initial encounter: Secondary | ICD-10-CM | POA: Diagnosis present

## 2018-09-18 DIAGNOSIS — N400 Enlarged prostate without lower urinary tract symptoms: Secondary | ICD-10-CM | POA: Insufficient documentation

## 2018-09-18 DIAGNOSIS — X58XXXA Exposure to other specified factors, initial encounter: Secondary | ICD-10-CM | POA: Diagnosis not present

## 2018-09-18 DIAGNOSIS — I130 Hypertensive heart and chronic kidney disease with heart failure and stage 1 through stage 4 chronic kidney disease, or unspecified chronic kidney disease: Secondary | ICD-10-CM | POA: Diagnosis not present

## 2018-09-18 DIAGNOSIS — T84021A Dislocation of internal left hip prosthesis, initial encounter: Secondary | ICD-10-CM | POA: Insufficient documentation

## 2018-09-18 DIAGNOSIS — I5032 Chronic diastolic (congestive) heart failure: Secondary | ICD-10-CM | POA: Insufficient documentation

## 2018-09-18 DIAGNOSIS — R001 Bradycardia, unspecified: Secondary | ICD-10-CM | POA: Diagnosis not present

## 2018-09-18 DIAGNOSIS — Z1159 Encounter for screening for other viral diseases: Secondary | ICD-10-CM | POA: Insufficient documentation

## 2018-09-18 DIAGNOSIS — K7460 Unspecified cirrhosis of liver: Secondary | ICD-10-CM | POA: Insufficient documentation

## 2018-09-18 DIAGNOSIS — S73005A Unspecified dislocation of left hip, initial encounter: Secondary | ICD-10-CM | POA: Diagnosis not present

## 2018-09-18 DIAGNOSIS — Z886 Allergy status to analgesic agent status: Secondary | ICD-10-CM | POA: Insufficient documentation

## 2018-09-18 DIAGNOSIS — I509 Heart failure, unspecified: Secondary | ICD-10-CM | POA: Diagnosis not present

## 2018-09-18 DIAGNOSIS — J449 Chronic obstructive pulmonary disease, unspecified: Secondary | ICD-10-CM | POA: Insufficient documentation

## 2018-09-18 DIAGNOSIS — I2510 Atherosclerotic heart disease of native coronary artery without angina pectoris: Secondary | ICD-10-CM | POA: Diagnosis not present

## 2018-09-18 DIAGNOSIS — Z888 Allergy status to other drugs, medicaments and biological substances status: Secondary | ICD-10-CM | POA: Insufficient documentation

## 2018-09-18 DIAGNOSIS — Z79899 Other long term (current) drug therapy: Secondary | ICD-10-CM | POA: Insufficient documentation

## 2018-09-18 DIAGNOSIS — Z885 Allergy status to narcotic agent status: Secondary | ICD-10-CM | POA: Insufficient documentation

## 2018-09-18 DIAGNOSIS — R52 Pain, unspecified: Secondary | ICD-10-CM | POA: Diagnosis not present

## 2018-09-18 DIAGNOSIS — R0902 Hypoxemia: Secondary | ICD-10-CM | POA: Diagnosis not present

## 2018-09-18 DIAGNOSIS — I10 Essential (primary) hypertension: Secondary | ICD-10-CM | POA: Diagnosis not present

## 2018-09-18 DIAGNOSIS — Z7951 Long term (current) use of inhaled steroids: Secondary | ICD-10-CM | POA: Insufficient documentation

## 2018-09-18 DIAGNOSIS — Z87891 Personal history of nicotine dependence: Secondary | ICD-10-CM | POA: Diagnosis not present

## 2018-09-18 DIAGNOSIS — Z96642 Presence of left artificial hip joint: Secondary | ICD-10-CM | POA: Diagnosis not present

## 2018-09-18 HISTORY — PX: HIP CLOSED REDUCTION: SHX983

## 2018-09-18 LAB — BASIC METABOLIC PANEL
Anion gap: 11 (ref 5–15)
BUN: 42 mg/dL — ABNORMAL HIGH (ref 8–23)
CO2: 20 mmol/L — ABNORMAL LOW (ref 22–32)
Calcium: 8.5 mg/dL — ABNORMAL LOW (ref 8.9–10.3)
Chloride: 103 mmol/L (ref 98–111)
Creatinine, Ser: 1.43 mg/dL — ABNORMAL HIGH (ref 0.61–1.24)
GFR calc Af Amer: 55 mL/min — ABNORMAL LOW (ref >60)
GFR calc non Af Amer: 48 mL/min — ABNORMAL LOW (ref >60)
Glucose, Bld: 119 mg/dL — ABNORMAL HIGH (ref 70–99)
Potassium: 4.5 mmol/L (ref 3.5–5.1)
Sodium: 134 mmol/L — ABNORMAL LOW (ref 135–145)

## 2018-09-18 LAB — CBC WITH DIFFERENTIAL/PLATELET
Abs Immature Granulocytes: 0.02 10*3/uL (ref 0.00–0.07)
Basophils Absolute: 0 10*3/uL (ref 0.0–0.1)
Basophils Relative: 0 %
Eosinophils Absolute: 0.1 10*3/uL (ref 0.0–0.5)
Eosinophils Relative: 2 %
HCT: 31.4 % — ABNORMAL LOW (ref 39.0–52.0)
Hemoglobin: 9.7 g/dL — ABNORMAL LOW (ref 13.0–17.0)
Immature Granulocytes: 0 %
Lymphocytes Relative: 9 %
Lymphs Abs: 0.6 10*3/uL — ABNORMAL LOW (ref 0.7–4.0)
MCH: 28.2 pg (ref 26.0–34.0)
MCHC: 30.9 g/dL (ref 30.0–36.0)
MCV: 91.3 fL (ref 80.0–100.0)
Monocytes Absolute: 0.5 10*3/uL (ref 0.1–1.0)
Monocytes Relative: 7 %
Neutro Abs: 5.8 10*3/uL (ref 1.7–7.7)
Neutrophils Relative %: 82 %
Platelets: 159 10*3/uL (ref 150–400)
RBC: 3.44 MIL/uL — ABNORMAL LOW (ref 4.22–5.81)
RDW: 15.8 % — ABNORMAL HIGH (ref 11.5–15.5)
WBC: 7.1 10*3/uL (ref 4.0–10.5)
nRBC: 0 % (ref 0.0–0.2)

## 2018-09-18 LAB — SARS CORONAVIRUS 2 BY RT PCR (HOSPITAL ORDER, PERFORMED IN ~~LOC~~ HOSPITAL LAB): SARS Coronavirus 2: NEGATIVE

## 2018-09-18 SURGERY — CLOSED MANIPULATION, JOINT, HIP
Anesthesia: General | Site: Hip | Laterality: Left

## 2018-09-18 MED ORDER — HYDROMORPHONE HCL 1 MG/ML IJ SOLN
1.0000 mg | Freq: Once | INTRAMUSCULAR | Status: AC
  Administered 2018-09-18: 1 mg via INTRAVENOUS
  Filled 2018-09-18: qty 1

## 2018-09-18 MED ORDER — SUCCINYLCHOLINE CHLORIDE 200 MG/10ML IV SOSY
PREFILLED_SYRINGE | INTRAVENOUS | Status: AC
  Filled 2018-09-18: qty 10

## 2018-09-18 MED ORDER — PROPOFOL 10 MG/ML IV BOLUS
INTRAVENOUS | Status: AC | PRN
  Administered 2018-09-18: 20 mg via INTRAVENOUS
  Administered 2018-09-18 (×3): 40 mg via INTRAVENOUS
  Administered 2018-09-18: 20 mg via INTRAVENOUS
  Administered 2018-09-18: 40 mg via INTRAVENOUS

## 2018-09-18 MED ORDER — SUCCINYLCHOLINE CHLORIDE 20 MG/ML IJ SOLN
INTRAMUSCULAR | Status: DC | PRN
  Administered 2018-09-18: 120 mg via INTRAVENOUS

## 2018-09-18 MED ORDER — LIDOCAINE HCL (PF) 2 % IJ SOLN
INTRAMUSCULAR | Status: DC | PRN
  Administered 2018-09-18: 60 mg via INTRADERMAL

## 2018-09-18 MED ORDER — PROPOFOL 10 MG/ML IV BOLUS
INTRAVENOUS | Status: AC
  Filled 2018-09-18: qty 20

## 2018-09-18 MED ORDER — PROPOFOL 500 MG/50ML IV EMUL
INTRAVENOUS | Status: DC | PRN
  Administered 2018-09-18: 120 mg via INTRAVENOUS
  Administered 2018-09-18: 60 mg via INTRAVENOUS

## 2018-09-18 MED ORDER — PROPOFOL 10 MG/ML IV BOLUS
INTRAVENOUS | Status: AC
  Filled 2018-09-18: qty 60

## 2018-09-18 MED ORDER — LIDOCAINE 2% (20 MG/ML) 5 ML SYRINGE
INTRAMUSCULAR | Status: AC
  Filled 2018-09-18: qty 5

## 2018-09-18 MED ORDER — SODIUM CHLORIDE 0.9 % IV SOLN
INTRAVENOUS | Status: DC | PRN
  Administered 2018-09-18: 17:00:00 via INTRAVENOUS

## 2018-09-18 MED ORDER — FENTANYL CITRATE (PF) 100 MCG/2ML IJ SOLN: 25.0000 ug | INTRAMUSCULAR | Status: DC | PRN

## 2018-09-18 MED ORDER — DIPHENHYDRAMINE HCL 50 MG/ML IJ SOLN
12.5000 mg | Freq: Once | INTRAMUSCULAR | Status: AC
  Administered 2018-09-18: 12.5 mg via INTRAVENOUS
  Filled 2018-09-18: qty 1

## 2018-09-18 NOTE — ED Notes (Signed)
Pt belongings put in locker #26 TCU  PANTS,SHIRT, CELL PHONE, WALLET, SHOES, SOCKS, INHALER GROUP OF PAPERS

## 2018-09-18 NOTE — ED Notes (Signed)
Patient on phone with girlfriend at this time.

## 2018-09-18 NOTE — Anesthesia Postprocedure Evaluation (Signed)
Anesthesia Post Note  Patient: RIGO LETTS  Procedure(s) Performed: CLOSED MANIPULATION HIP (Left Hip)     Patient location during evaluation: PACU Anesthesia Type: General Level of consciousness: awake and alert Pain management: pain level controlled Vital Signs Assessment: post-procedure vital signs reviewed and stable Respiratory status: spontaneous breathing, nonlabored ventilation, respiratory function stable and patient connected to nasal cannula oxygen Cardiovascular status: blood pressure returned to baseline and stable Postop Assessment: no apparent nausea or vomiting Anesthetic complications: no    Last Vitals:  Vitals:   09/18/18 1730 09/18/18 1745  BP: (!) 134/45 (!) 155/51  Pulse: (!) 42   Resp: 14   Temp:    SpO2: 100%     Last Pain:  Vitals:   09/18/18 1745  PainSc: 0-No pain                 Audry Pili

## 2018-09-18 NOTE — ED Triage Notes (Signed)
Per EMS, patient from home, felt left hip "pop" while attempting to get in the car this morning. Deformity to hip. Hx replacement and dislocation. Hx COPD.  18g R FA 280mcg Fentanyl with EMS

## 2018-09-18 NOTE — ED Notes (Signed)
Delo MD made aware of patient's trending HR.

## 2018-09-18 NOTE — ED Provider Notes (Signed)
[facility] DEPT Provider Note   CSN: [id number] Arrival date & time: 09/18/18  1331    History   Chief Complaint Chief Complaint  Patient presents with  . Hip Pain    HPI [name] is a 78 y.o. male.     Patient is a 78 year old male with past medical history of coronary artery disease, congestive heart failure, COPD, aortic aneurysm, and left total hip replacement with revision in October 2019.  He presents today for evaluation of left hip pain.  Patient states he was getting into a car that was low to the ground.  As he sat into the seat, he felt a pop followed by pain in his hip.  He believes he has dislocated as this has happened in the past.  The history is provided by the patient.  Hip Pain  This is a new problem. The current episode started less than 1 hour ago. The problem occurs constantly. The problem has not changed since onset.Nothing aggravates the symptoms. Nothing relieves the symptoms. He has tried nothing for the symptoms.    Past Medical History:  Diagnosis Date  . AAA (abdominal aortic aneurysm) ([place])   . Blindness of left eye   . 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 CAD (coronary artery disease)    a. s/p CABG in 10/2008 with LIMA-LAD, SVG-OM1 with Y-graft to PL branch, and SVG-RCA  . Carotid stenosis   . Cerebrovascular disease   . CHF (congestive heart failure) ([place])   . Cirrhosis ([place])    on CT a/p 07/2013, no longer drinking  . CONGENITAL UNSPEC REDUCTION DEFORMITY LOWER LIMB   . Constipation due to opioid therapy 2014   began about a year ago  . COPD (chronic obstructive pulmonary disease) ([place])   . Foley catheter in place   . HTN (hypertension)   . Hyperlipidemia   . 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 Neuromuscular disorder ([place])    PT STATES HE HAS BEEN TOLD HE EITHER HAD POLIO OR CEREBRAL  PALSY - STATES HIS LEFT LEG IS SHORTER AND AFFECTS HIS BALANCE - HE WEARS ELEVATED SHOE -LEFT  . Pain    BACK, LEGS AND ARMS  . Shortness of breath    EXERTION  . TOBACCO USE, QUIT     Patient Active Problem List   Diagnosis Date Noted  . Olecranon bursitis of left elbow 07/02/2018  . Failed total hip arthroplasty with dislocation, initial encounter ([place]) 06/22/2018  . Closed left hip fracture, initial encounter ([place]) 01/26/2018  . Neck pain 12/18/2017  . Right hip pain 06/22/2017  . Chronic diastolic heart failure ([place]) 03/16/2017  . CKD (chronic kidney disease) stage 3, GFR 30-59 ml/min ([facility]) 03/16/2017  . Diastolic dysfunction 41/93/7902  . Opiate dependence ([place]) 02/13/2017  . Uremia 01/21/2017  . Acute renal failure superimposed on stage 3 chronic kidney disease ([place]) 01/21/2017  . Atrial flutter ([place]) 11/24/2014  . Aortic stenosis 03/24/2014  . Second degree AV block, Mobitz type I 09/06/2013  . Cirrhosis ([place]) 07/28/2013  . Bladder outlet obstruction 07/28/2013  . Impaired glucose metabolism   . Chronic back pain   . Benign neoplasm of colon 01/08/2011  . Abdominal aortic aneurysm ([place]) 08/12/2010  . TOBACCO USE, QUIT 04/11/2009  . Occlusion and stenosis of carotid artery 01/08/2009  . Hyperlipidemia 11/13/2008  . ANEMIA 11/13/2008  . Essential hypertension 11/13/2008  .  Coronary atherosclerosis 11/13/2008  . COPD (chronic obstructive pulmonary disease) ([place]) 11/13/2008    Past Surgical History:  Procedure Laterality Date  . CORONARY ARTERY BYPASS GRAFT  11/03/2008   Ricard Dillon - x4: left internal mammary artery to the distal left anterior descending, saphemous vein graft to the first circumflex marginal branch with a Y graft sequentiallly to a left posterolateral branch, saphenous vein graft to the distal right coronary artery  . ELECTROPHYSIOLOGIC STUDY N/A 01/18/2015   Procedure: A-Flutter Ablation;  Surgeon: Deboraha Sprang, MD;  Location: [facility];  Service:  Cardiovascular;  Laterality: N/A;  . GREEN LIGHT LASER TURP (TRANSURETHRAL RESECTION OF PROSTATE N/A 02/14/2014   Procedure: GREEN LIGHT LASER TURP (TRANSURETHRAL RESECTION OF PROSTATE;  Surgeon: Festus Aloe, MD;  Location: [facility];  Service: Urology;  Laterality: N/A;  . HIP ARTHROPLASTY Left 01/26/2018   Procedure: LEFT HIP POSTERIOR HEMIARTHROPLASTY;  Surgeon: [name], MD;  Location: [facility];  Service: Orthopedics;  Laterality: Left;  . TOTAL HIP REVISION Left 06/24/2018   Procedure: ACETABULAR HIP REVISION POSTERIOR;  Surgeon: [name], MD;  Location: [facility];  Service: Orthopedics;  Laterality: Left;        Home Medications    Prior to Admission medications   Medication Sig Start Date End Date Taking? Authorizing Provider  albuterol (VENTOLIN HFA) 108 (90 Base) MCG/ACT inhaler Inhale 2 puffs into the lungs every 6 (six) hours as needed for wheezing or shortness of breath. 09/06/18   [name], MD  ferrous sulfate 325 (65 FE) MG tablet Take 1 tablet (325 mg total) by mouth 2 (two) times daily with a meal. 06/28/18   Hall, Lorenda Cahill, DO  finasteride (PROSCAR) 5 MG tablet Take 5 mg by mouth daily.    [provider]  fluticasone (FLONASE) 50 MCG/ACT nasal spray PLACE 1 SPRAY INTO BOTH NOSTRILS EVERY MORNING. Patient taking differently: Place 1 spray into both nostrils daily.  12/07/17   [name], MD  furosemide (LASIX) 80 MG tablet Take 1 tablet (80 mg total) by mouth daily. 06/28/18   Kayleen Memos, DO  Guaifenesin 1200 MG TB12 Take 1 tablet (1,200 mg total) by mouth 2 (two) times daily. 02/15/18   [name], MD  lactulose (CHRONULAC) 10 GM/15ML solution TAKE 30 MLS BY MOUTH 2 TIMES DAILY AS NEEDED FOR MILD CONSTIPATION OR MODERATE CONSTIPATION Patient taking differently: Take 20 g by mouth 2 (two) times daily as needed for mild constipation.  06/01/18   [name], MD  lidocaine (LIDODERM) 5 % Place 2 patches onto the skin  daily. Remove & Discard patch within 12 hours or as directed by MD Patient not taking: Reported on 07/02/2018 06/28/18   Kayleen Memos, DO  lidocaine (XYLOCAINE) 5 % ointment Apply topically 2 (two) times daily as needed for moderate pain. 06/28/18   Kayleen Memos, DO  Menthol-Methyl Salicylate (MUSCLE RUB) 10-15 % CREA Apply 1 application topically 2 (two) times daily as needed for muscle pain. Patient not taking: Reported on 07/05/2018 06/28/18   Kayleen Memos, DO  methocarbamol (ROBAXIN) 500 MG tablet Take 1 tablet (500 mg total) by mouth every 6 (six) hours as needed for muscle spasms. 06/25/18   Danae Orleans, PA-C  Naloxone HCl (NARCAN NA) Place 1 application into the nose once.    [provider]  nystatin ointment (MYCOSTATIN) Apply 1 application topically 2 (two) times daily. 04/15/18   [name], MD  oxyCODONE (ROXICODONE) 30 MG  immediate release tablet Take 1 tablet (30 mg total) by mouth 5 (five) times daily as needed for moderate pain or severe pain. 06/25/18   Danae Orleans, PA-C  polyethylene glycol (MIRALAX / GLYCOLAX) packet Take 17 g by mouth daily. Patient not taking: Reported on 07/02/2018 06/28/18   Kayleen Memos, DO  potassium chloride SA (KLOR-CON M20) 20 MEQ tablet Take 1 tablet (20 mEq total) by mouth daily. 06/28/18   Kayleen Memos, DO  pravastatin (PRAVACHOL) 20 MG tablet TAKE 1 TABLET BY MOUTH EVERY DAY Patient taking differently: Take 20 mg by mouth daily.  01/12/18   [name], MD  sertraline (ZOLOFT) 25 MG tablet TAKE 1 TABLET BY MOUTH EVERYDAY AT BEDTIME Patient taking differently: Take 25 mg by mouth at bedtime as needed (sleep).  06/22/18   [name], MD  spironolactone (ALDACTONE) 25 MG tablet TAKE 1 TABLET BY MOUTH EVERY DAY Patient taking differently: Take 25 mg by mouth daily.  04/08/18   [name], MD  tamsulosin (FLOMAX) 0.4 MG CAPS capsule TAKE 1 CAPSULE BY MOUTH EVERY DAY Patient taking differently: Take 0.4 mg  by mouth daily.  04/05/18   [name], MD    Family History Family History  Problem Relation Age of Onset  . Cirrhosis Mother        died at 29  . Heart attack Father   . Other Brother        GSW  . Other Brother        died in house fire  . Cancer Brother        unsure of type Believes colon or prostate/fim  . Colon cancer Neg Hx   . Stomach cancer Neg Hx     Social History Social History   Tobacco Use  . Smoking status: Former Research scientist (life sciences)  . Smokeless tobacco: Never Used  Substance Use Topics  . Alcohol use: No    Alcohol/week: 0.0 standard drinks    Comment: History of heavy alcohol use per pt. Quit many years ago1/1/ 2005  . Drug use: No     Allergies   Doxycycline; Amlodipine; Fish allergy; Hydrocodone; Other; and Tylenol [acetaminophen]   Review of Systems Review of Systems  All other systems reviewed and are negative.    Physical Exam Updated Vital Signs There were no vitals taken for this visit.  Physical Exam Vitals signs and nursing note reviewed.  Constitutional:      General: He is not in acute distress.    Appearance: He is well-developed. He is not diaphoretic.  HENT:     Head: Normocephalic and atraumatic.  Neck:     Musculoskeletal: Normal range of motion and neck supple.  Cardiovascular:     Rate and Rhythm: Normal rate and regular rhythm.     Heart sounds: No murmur. No friction rub.  Pulmonary:     Effort: Pulmonary effort is normal. No respiratory distress.     Breath sounds: Normal breath sounds. No wheezing or rales.  Abdominal:     General: Bowel sounds are normal. There is no distension.     Palpations: Abdomen is soft.     Tenderness: There is no abdominal tenderness.  Musculoskeletal: Normal range of motion.     Comments: There is obvious deformity of the left hip.  The left leg is shortened and internally rotated.  DP pulses are easily palpable.  Motor and sensation are intact to both feet.  Skin:    General: Skin  is warm and dry.  Neurological:     Mental Status: He is alert and oriented to person, place, and time.     Coordination: Coordination normal.      ED Treatments / Results  Labs (all labs ordered are listed, but only abnormal results are displayed) Labs Reviewed - No data to display  EKG None  Radiology No results found.  Procedures .Sedation Date/Time: 09/18/2018 3:06 PM Performed by: Veryl Speak, MD Authorized by: Veryl Speak, MD   Consent:    Consent obtained:  Verbal   Consent given by:  Patient   Risks discussed:  Allergic reaction, dysrhythmia, inadequate sedation, nausea, prolonged hypoxia resulting in organ damage, prolonged sedation necessitating reversal, respiratory compromise necessitating ventilatory assistance and intubation and vomiting   Alternatives discussed:  Analgesia without sedation, anxiolysis and regional anesthesia Universal protocol:    Procedure explained and questions answered to patient or proxy's satisfaction: yes     Relevant documents present and verified: yes     Test results available and properly labeled: yes     Imaging studies available: yes     Required blood products, implants, devices, and special equipment available: yes     Site/side marked: yes     Immediately prior to procedure a time out was called: yes     Patient identity confirmation method:  Verbally with patient Indications:    Procedure necessitating sedation performed by:  Physician performing sedation Pre-sedation assessment:    Time since last food or drink:  8AM   ASA classification: class 1 - normal, healthy patient     Neck mobility: normal     Mouth opening:  3 or more finger widths   Thyromental distance:  4 finger widths   Mallampati score:  I - soft palate, uvula, fauces, pillars visible   Pre-sedation assessments completed and reviewed: airway patency, cardiovascular function, hydration status, mental status, nausea/vomiting, pain level, respiratory  function and temperature   Immediate pre-procedure details:    Reassessment: Patient reassessed immediately prior to procedure     Reviewed: vital signs, relevant labs/tests and NPO status     Verified: bag valve mask available, emergency equipment available, intubation equipment available, IV patency confirmed, oxygen available and suction available   Procedure details (see MAR for exact dosages):    Preoxygenation:  Nasal cannula   Sedation:  Propofol   Intra-procedure monitoring:  Blood pressure monitoring, cardiac monitor, continuous pulse oximetry, frequent LOC assessments, frequent vital sign checks and continuous capnometry   Intra-procedure events: none     Total Provider sedation time (minutes):  20 Post-procedure details:    Attendance: Constant attendance by certified staff until patient recovered     Recovery: Patient returned to pre-procedure baseline     Post-sedation assessments completed and reviewed: airway patency, cardiovascular function, hydration status, mental status, nausea/vomiting, pain level, respiratory function and temperature     Patient is stable for discharge or admission: yes     Patient tolerance:  Tolerated well, no immediate complications   (including critical care time)  Medications Ordered in ED Medications - No data to display   Initial Impression / Assessment and Plan / ED Course  I have reviewed the triage vital signs and the nursing notes.  Pertinent labs & imaging results that were available during my care of the patient were reviewed by me and considered in my medical decision making (see chart for details).  X-rays confirm a prosthesis dislocation of the left hip.  An attempt under conscious  sedation was made to reduce, however was unsuccessful.  Orthopedics will be consulted.    Final Clinical Impressions(s) / ED Diagnoses   Final diagnoses:  None    ED Discharge Orders    None       Veryl Speak, MD 09/18/18 [phone]

## 2018-09-18 NOTE — ED Notes (Signed)
Per patient request, patient's girlfriend Claiborne Billings, called and given update.

## 2018-09-18 NOTE — Anesthesia Preprocedure Evaluation (Addendum)
Anesthesia Evaluation  Patient identified by MRN, date of birth, ID band Patient awake    Reviewed: Allergy & Precautions, NPO status , Patient's Chart, lab work & pertinent test results  History of Anesthesia Complications Negative for: history of anesthetic complications  Airway Mallampati: II  TM Distance: >3 FB Neck ROM: Limited    Dental  (+) Edentulous Lower, Edentulous Upper   Pulmonary COPD, former smoker,    breath sounds clear to auscultation       Cardiovascular hypertension, Pt. on medications + CAD, + Past MI, + CABG, + Peripheral Vascular Disease and +CHF  + dysrhythmias Atrial Fibrillation + Valvular Problems/Murmurs MR  Rhythm:Irregular Rate:Normal   '20 TTE - EF >65%. LV cavity was severely dilated. There is mildly increased left ventricular wall thickness. RV cavity was mildly enlarged. LA and RA were severely dilated. Mild-mod MR. Mild sclerosis of AV. Possible PFO.  '19 Carotid US - 40-59% right ICAS, 1-39% left ICAS    Neuro/Psych  Blind left eye   Neuromuscular disease (cerebral palsy) negative psych ROS   GI/Hepatic negative GI ROS, (+) Cirrhosis       ,   Endo/Other  negative endocrine ROS  Renal/GU CRFRenal disease    BPH     Musculoskeletal negative musculoskeletal ROS (+)   Abdominal   Peds  Hematology  (+) anemia ,   Anesthesia Other Findings   Reproductive/Obstetrics                            Anesthesia Physical Anesthesia Plan  ASA: III  Anesthesia Plan: General   Post-op Pain Management:    Induction: Intravenous  PONV Risk Score and Plan: 2 and Treatment may vary due to age or medical condition and Propofol infusion  Airway Management Planned: Mask and Natural Airway  Additional Equipment: None  Intra-op Plan:   Post-operative Plan:   Informed Consent: I have reviewed the patients History and Physical, chart, labs and discussed the  procedure including the risks, benefits and alternatives for the proposed anesthesia with the patient or authorized representative who has indicated his/her understanding and acceptance.       Plan Discussed with: CRNA and Anesthesiologist  Anesthesia Plan Comments:        Anesthesia Quick Evaluation

## 2018-09-18 NOTE — Discharge Instructions (Signed)
Call the office on Monday to schedule a follow up appt with Dr. Alvan Dame in 7-10 days  Keep abduction pillow in place until follow up

## 2018-09-18 NOTE — ED Provider Notes (Signed)
Discussed with Dr Onnie Graham.  Will come in to evaluate pt regarding his hip dislocation.   Dorie Rank, MD 09/18/18 [phone]

## 2018-09-18 NOTE — Sedation Documentation (Signed)
Unsuccessful attempt reduction of left hip by Dr. Stark Jock.

## 2018-09-18 NOTE — ED Notes (Signed)
Patient c/o itching in arm after administration of dilaudid. IV flushed and patent. MD made aware.

## 2018-09-18 NOTE — Transfer of Care (Signed)
Immediate Anesthesia Transfer of Care Note  Patient: [name]  Procedure(s) Performed: CLOSED MANIPULATION HIP (Left Hip)  Patient Location: PACU  Anesthesia Type:MAC  Level of Consciousness: drowsy and responds to stimulation  Airway & Oxygen Therapy: Patient Spontanous Breathing and Patient connected to face mask oxygen  Post-op Assessment: Report given to RN and Post -op Vital signs reviewed and stable  Post vital signs: Reviewed and stable  Last Vitals:  Vitals Value Taken Time  BP 134/45 09/18/2018  5:30 PM  Temp    Pulse 47 09/18/2018  5:32 PM  Resp 12 09/18/2018  5:32 PM  SpO2 100 % 09/18/2018  5:32 PM  Vitals shown include unvalidated device data.  Last Pain:  Vitals:   09/18/18 1728  PainSc: (P) Asleep         Complications: No apparent anesthesia complications

## 2018-09-18 NOTE — ED Notes (Signed)
Ortho and respiratory made aware of sedation.

## 2018-09-18 NOTE — ED Notes (Signed)
Charge RN made aware, per OR, patient to hold in ED before transfer to OR pending coronavirus test result.

## 2018-09-18 NOTE — Op Note (Signed)
09/18/2018  5:27 PM  PATIENT:   [name]  78 y.o. male  PRE-OPERATIVE DIAGNOSIS:  dislocated left hip  POST-OPERATIVE DIAGNOSIS: Same  PROCEDURE: Closed reduction of dislocated left total hip arthroplasty  SURGEON:  Bubba Vanbenschoten, Metta Clines. M.D.  ASSISTANTS: Jenetta Loges, PA-C  ANESTHESIA:   IV sedation, mask general  EBL: None  SPECIMEN: None  Drains: None   PATIENT DISPOSITION:  PACU - hemodynamically stable.    PLAN OF CARE: Discharge to home after PACU  Preoperatively I counseled [name] regarding treatment options as well as the potential risks versus benefits thereof.  Possible surgical complications were reviewed including the potential for recurrent dislocation, periprosthetic fracture, and potential anesthetic complication.  He understands and accepts and agrees with her plan for closed reduction  Procedure in detail:  Patient was brought to the operative room on his gurney and timeout was called.  Anesthesia in the form of mask as well as IV sedation muscle relaxant was given by anesthesia and once complete relaxation was achieved I performed a reduction maneuver with combination of longitudinal traction and internal rotation and abduction.  Sustained traction was applied and took several tries to ultimately gain a concentric reduction.  We did utilize fluoroscopic imaging to confirm reduction and once the hip was reduced it was clear that he had a significant flexion and abduction contracture of the hip.  We did place him into a hip abduction pillow.  He was then awakened and taken to the recovery room in stable condition  Metta Clines Kouper Spinella MD    Contact # [phone] Yeah I think that

## 2018-09-18 NOTE — Progress Notes (Signed)
PACU Nursing Note: Spoke with son, [name], by phone. Informed him that his father was ready for DC to home per MD instructions, updated on his fathers current status post operatively.  Teaching done re: post op care, stressed the importance of safety after receiving anesthesia, also discussed post operative diet per outline of the Anesthesia Team. Pt teaching done re: Abductor pillow in place, to utilize while sitting and lying in bed, also to use raised chairs and raised bedside to commode. Also discussed with son, that it has been recommended that a follow up Ortho MD appointment be made and attended as per the AVS discharge sheet. Spoke with son as to when to call MD, I.e. uncontrolled pain, unable to walk, what appears to be a re-dislocation of the left hip. Opportunity for questions provided. AVS sheet provided to son.

## 2018-09-18 NOTE — ED Notes (Signed)
Patient's girlfriend, Claiborne Billings [phone] Patient's son, Harrie Jeans, [phone]

## 2018-09-18 NOTE — ED Notes (Signed)
Bed: WA09 Expected date: 09/18/18 Expected time: 1:34 PM Means of arrival: Ambulance Comments: Hip dislocation

## 2018-09-18 NOTE — ED Notes (Signed)
Orthopedist at bedside

## 2018-09-18 NOTE — ED Notes (Signed)
Remaining dilaudid wasted with Ali Lowe RN witness.

## 2018-09-18 NOTE — Consult Note (Signed)
Reason for Consult: Dislocated left total hip arthroplasty Referring Physician: EDP  HPI: [name] is an 78 y.o. male with an extensive past medical history, status post a left hip fracture in the fall 2019, at which time he underwent a hemiarthroplasty.  The patient unfortunately developed recurrent instability and was ultimately returned to surgery in March of this year which time he had conversion to a total hip arthroplasty.  Patient reports that he had been doing very well until earlier today when he sat down into a car which was particularly low and felt a pop in the left hip with immediate pain and subsequent deformity.  He presented to the [facility] long emergency room radiographs confirmed a superior dislocation of the left hip prosthesis.  Attempts were made by the EDP x2 with propofol sedation with unsuccessful reduction.  He is now brought to the operating room for attempted closed reduction  Past Medical History:  Diagnosis Date  . AAA (abdominal aortic aneurysm) ([place])   . Blindness of left eye   . 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 CAD (coronary artery disease)    a. s/p CABG in 10/2008 with LIMA-LAD, SVG-OM1 with Y-graft to PL branch, and SVG-RCA  . Carotid stenosis   . Cerebrovascular disease   . CHF (congestive heart failure) ([place])   . Cirrhosis ([place])    on CT a/p 07/2013, no longer drinking  . CONGENITAL UNSPEC REDUCTION DEFORMITY LOWER LIMB   . Constipation due to opioid therapy 2014   began about a year ago  . COPD (chronic obstructive pulmonary disease) ([place])   . Foley catheter in place   . HTN (hypertension)   . Hyperlipidemia   . 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 Neuromuscular disorder ([place])    PT STATES HE HAS BEEN TOLD HE EITHER HAD POLIO OR CEREBRAL PALSY - STATES HIS LEFT LEG IS SHORTER AND AFFECTS HIS BALANCE - HE WEARS ELEVATED SHOE -LEFT   . Pain    BACK, LEGS AND ARMS  . Shortness of breath    EXERTION  . TOBACCO USE, QUIT     Past Surgical History:  Procedure Laterality Date  . CORONARY ARTERY BYPASS GRAFT  11/03/2008   Ricard Dillon - x4: left internal mammary artery to the distal left anterior descending, saphemous vein graft to the first circumflex marginal branch with a Y graft sequentiallly to a left posterolateral branch, saphenous vein graft to the distal right coronary artery  . ELECTROPHYSIOLOGIC STUDY N/A 01/18/2015   Procedure: A-Flutter Ablation;  Surgeon: Deboraha Sprang, MD;  Location: [facility];  Service: Cardiovascular;  Laterality: N/A;  . GREEN LIGHT LASER TURP (TRANSURETHRAL RESECTION OF PROSTATE N/A 02/14/2014   Procedure: GREEN LIGHT LASER TURP (TRANSURETHRAL RESECTION OF PROSTATE;  Surgeon: Festus Aloe, MD;  Location: [facility];  Service: Urology;  Laterality: N/A;  . HIP ARTHROPLASTY Left 01/26/2018   Procedure: LEFT HIP POSTERIOR HEMIARTHROPLASTY;  Surgeon: [name], MD;  Location: [facility];  Service: Orthopedics;  Laterality: Left;  . TOTAL HIP REVISION Left 06/24/2018   Procedure: ACETABULAR HIP REVISION POSTERIOR;  Surgeon: [name], MD;  Location: [facility];  Service: Orthopedics;  Laterality: Left;    Family History  Problem Relation Age of Onset  . Cirrhosis Mother        died at 58  . Heart attack Father   . Other Brother  GSW  . Other Brother        died in house fire  . Cancer Brother        unsure of type Believes colon or prostate/fim  . Colon cancer Neg Hx   . Stomach cancer Neg Hx     Social History:  reports that he has quit smoking. He has never used smokeless tobacco. He reports that he does not drink alcohol or use drugs.  Allergies:  Allergies  Allergen Reactions  . Doxycycline   . Amlodipine Nausea And Vomiting and Other (See Comments)    dizziness  . Fish Allergy Nausea And Vomiting    STATES HE HAS NOT EATEN ANY FISH INCLUDING SHELLFISH FOR PAST 40  YRS - IT CAUSED NAUSEA  . Hydrocodone Itching and Rash  . Other Nausea And Vomiting    STATES HE HAS NOT EATEN ANY FISH INCLUDING SHELLFISH FOR PAST 40 YRS - IT CAUSED NAUSEA  . Tylenol [Acetaminophen] Other (See Comments)    Liver problems    Medications: I have reviewed the patient's current medications.  Results for orders placed or performed during the hospital encounter of 09/18/18 (from the past 48 hour(s))  Basic metabolic panel     Status: Abnormal   Collection Time: 09/18/18  3:14 PM  Result Value Ref Range   Sodium 134 (L) 135 - 145 mmol/L   Potassium 4.5 3.5 - 5.1 mmol/L   Chloride 103 98 - 111 mmol/L   CO2 20 (L) 22 - 32 mmol/L   Glucose, Bld 119 (H) 70 - 99 mg/dL   BUN 42 (H) 8 - 23 mg/dL   Creatinine, Ser 1.43 (H) 0.61 - 1.24 mg/dL   Calcium 8.5 (L) 8.9 - 10.3 mg/dL   GFR calc non Af Amer 48 (L) >60 mL/min   GFR calc Af Amer 55 (L) >60 mL/min   Anion gap 11 5 - 15    Comment: Performed at [facility], [place] [address] [place], [place] [address]  CBC with Differential     Status: Abnormal   Collection Time: 09/18/18  3:14 PM  Result Value Ref Range   WBC 7.1 4.0 - 10.5 K/uL   RBC 3.44 (L) 4.22 - 5.81 MIL/uL   Hemoglobin 9.7 (L) 13.0 - 17.0 g/dL   HCT 31.4 (L) 39.0 - 52.0 %   MCV 91.3 80.0 - 100.0 fL   MCH 28.2 26.0 - 34.0 pg   MCHC 30.9 30.0 - 36.0 g/dL   RDW 15.8 (H) 11.5 - 15.5 %   Platelets 159 150 - 400 K/uL   nRBC 0.0 0.0 - 0.2 %   Neutrophils Relative % 82 %   Neutro Abs 5.8 1.7 - 7.7 K/uL   Lymphocytes Relative 9 %   Lymphs Abs 0.6 (L) 0.7 - 4.0 K/uL   Monocytes Relative 7 %   Monocytes Absolute 0.5 0.1 - 1.0 K/uL   Eosinophils Relative 2 %   Eosinophils Absolute 0.1 0.0 - 0.5 K/uL   Basophils Relative 0 %   Basophils Absolute 0.0 0.0 - 0.1 K/uL   Immature Granulocytes 0 %   Abs Immature Granulocytes 0.02 0.00 - 0.07 K/uL    Comment: Performed at [facility], [place] [address] [place],  [address]  SARS  Coronavirus 2 (CEPHEID - Performed in [facility] hospital lab), Hosp Order     Status: None   Collection Time: 09/18/18  3:14 PM  Result Value Ref Range   SARS Coronavirus 2  NEGATIVE NEGATIVE    Comment: (NOTE) If result is NEGATIVE SARS-CoV-2 target nucleic acids are NOT DETECTED. The SARS-CoV-2 RNA is generally detectable in upper and lower  respiratory specimens during the acute phase of infection. The lowest  concentration of SARS-CoV-2 viral copies this assay can detect is 250  copies / mL. A negative result does not preclude SARS-CoV-2 infection  and should not be used as the sole basis for treatment or other  patient management decisions.  A negative result may occur with  improper specimen collection / handling, submission of specimen other  than nasopharyngeal swab, presence of viral mutation(s) within the  areas targeted by this assay, and inadequate number of viral copies  (<250 copies / mL). A negative result must be combined with clinical  observations, patient history, and epidemiological information. If result is POSITIVE SARS-CoV-2 target nucleic acids are DETECTED. The SARS-CoV-2 RNA is generally detectable in upper and lower  respiratory specimens dur ing the acute phase of infection.  Positive  results are indicative of active infection with SARS-CoV-2.  Clinical  correlation with patient history and other diagnostic information is  necessary to determine patient infection status.  Positive results do  not rule out bacterial infection or co-infection with other viruses. If result is PRESUMPTIVE POSTIVE SARS-CoV-2 nucleic acids MAY BE PRESENT.   A presumptive positive result was obtained on the submitted specimen  and confirmed on repeat testing.  While 2019 novel coronavirus  (SARS-CoV-2) nucleic acids may be present in the submitted sample  additional confirmatory testing may be necessary for epidemiological  and / or clinical management purposes  to  differentiate between  SARS-CoV-2 and other Sarbecovirus currently known to infect humans.  If clinically indicated additional testing with an alternate test  methodology [phone]) is advised. The SARS-CoV-2 RNA is generally  detectable in upper and lower respiratory sp ecimens during the acute  phase of infection. The expected result is Negative. Fact Sheet for Patients:  StrictlyIdeas.no Fact Sheet for Healthcare Providers: BankingDealers.co.za This test is not yet approved or cleared by the [place] FDA and has been authorized for detection and/or diagnosis of SARS-CoV-2 by FDA under an Emergency Use Authorization (EUA).  This EUA will remain in effect (meaning this test can be used) for the duration of the COVID-19 declaration under Section 564(b)(1) of the Act, 21 U.S.C. section 360bbb-3(b)(1), unless the authorization is terminated or revoked sooner. Performed at [facility], [place] [address] [place], [place] [address]     Dg Hip Unilat W Or Wo Pelvis 2-3 Views Left  Result Date: 09/18/2018 CLINICAL DATA:  Left hip pain, suspect dislocation EXAM: DG HIP (WITH OR WITHOUT PELVIS) 2-3V LEFT COMPARISON:  06/22/2018 left hip radiographs FINDINGS: Images obscured by patient's pants zipper and metallic keys overlying the left thigh. Superolateral left hip dislocation. Left total hip arthroplasty. No hardware fracture. No acute osseous fracture. No pelvic diastasis. IMPRESSION: Superolateral left hip dislocation. Left total hip arthroplasty. No acute osseous or hardware fracture. Electronically Signed   By: Ilona Sorrel M.D.   On: 09/18/2018 14:37     Vitals Temp:  [97.8 F (36.6 C)] 97.8 F (36.6 C) (05/30 1350) Pulse Rate:  [37-98] 76 (05/30 1630) Resp:  [11-29] 16 (05/30 1630) BP: (138-183)/(46-73) 183/63 (05/30 1630) SpO2:  [95 %-100 %] 98 % (05/30 1630) There is no height or weight on file to calculate  BMI.  Physical Exam: Patient is a slender, elderly gentleman who is alert and oriented.  Left  hip incision is well-healed.  He holds the left lower extremity in a flexed, abducted, and internally rotated position.  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  Grossly neurovascular intact.  Radiographs  X-rays confirm a posterior superior dislocation of the left hip     Assessment/Plan: Impression: Dislocated left total hip arthroplasty Treatment: Plan for closed reduction    Tarrah Furuta M Dartanian Knaggs 09/18/2018, 4:54 PM  Contact # [phone]

## 2018-09-19 ENCOUNTER — Encounter (HOSPITAL_COMMUNITY): Payer: Self-pay | Admitting: Orthopedic Surgery

## 2018-09-24 ENCOUNTER — Ambulatory Visit: Payer: Self-pay | Admitting: *Deleted

## 2018-09-24 ENCOUNTER — Telehealth: Payer: Self-pay | Admitting: Internal Medicine

## 2018-09-24 NOTE — Telephone Encounter (Signed)
Patient has called back in regard to nurse triage note.  Epic will not allow myself to type into this message.   I have informed patient of what Dr. Sharlet Salina stated.  Patient states he wears compression stockings, elevates his feet and takes his  Lasix. Patient states he gets "bubbles" of skin with fluid on his shin.  States that he pops the bubbles to release the fluid.  Patient states that he uses neosporin after he releases the fluid.  Patient states this burns really bad and would like to know what else he could apply to the area.

## 2018-09-24 NOTE — Telephone Encounter (Signed)
Called patient back but no voicemail set up to inform of MD response. When/if patient calls back inform of MD response

## 2018-09-24 NOTE — Telephone Encounter (Signed)
Is he having any rash? If not then no cream is needed. Can use elevation and compression stockings. Make sure he is taking his lasix daily.

## 2018-09-24 NOTE — Telephone Encounter (Signed)
Does not need to apply anything just wash with soap and water daily.

## 2018-09-24 NOTE — Telephone Encounter (Signed)
Patient informed of MD response and stated understanding  

## 2018-09-24 NOTE — Telephone Encounter (Signed)
Please advise.  If appointment is needed, he is unable to come into the office and unable to do virtual. Okay for phone visit or what would you prefer?

## 2018-09-24 NOTE — Telephone Encounter (Signed)
Patient is having edema and weeping at ankles- he is cleaning the area- he is having burning where he putting the antibiotic ointment on the area. Patient reports he is doing a good job with cleaning his ankles and keeping the area of edema and weeping clean- but he is having significant burning when he applies the antibiotic ointment and want to know if PCP can help with that. Call to office for appointment- patient states he can not drive due to hips and he only has landline phone. Call sent for PCP review.  Reason for Disposition . [1] MODERATE leg swelling (e.g., swelling extends up to knees) AND [2] new onset or worsening  Answer Assessment - Initial Assessment Questions 1. ONSET: "When did the swelling start?" (e.g., minutes, hours, days)     (Patient fell in Oct- patient had surgery and therapy, patient has had several complications- in Mar he had replacement- this Saturday patient had difficult with hip and had to be taken to hospital he started having the swelling- he has been using compression socks, lasix, and elevation) This week end 2. LOCATION: "What part of the leg is swollen?"  "Are both legs swollen or just one leg?"     Left foot- top to front of the shin-4-5 inches, Right foot in minor 3. SEVERITY: "How bad is the swelling?" (e.g., localized; mild, moderate, severe)  - Localized - small area of swelling localized to one leg  - MILD pedal edema - swelling limited to foot and ankle, pitting edema < 1/4 inch (6 mm) deep, rest and elevation eliminate most or all swelling  - MODERATE edema - swelling of lower leg to knee, pitting edema > 1/4 inch (6 mm) deep, rest and elevation only partially reduce swelling  - SEVERE edema - swelling extends above knee, facial or hand swelling present      mild 4. REDNESS: "Does the swelling look red or infected?"     Reddish pink where blisters were- no sign of infection 5. PAIN: "Is the swelling painful to touch?" If so, ask: "How painful is it?"    (Scale 1-10; mild, moderate or severe)     Patient is under pain management- swelling is only painful when ointment is applied 6. FEVER: "Do you have a fever?" If so, ask: "What is it, how was it measured, and when did it start?"      No fever-97.7 7. CAUSE: "What do you think is causing the leg swelling?"     edema 8. MEDICAL HISTORY: "Do you have a history of heart failure, kidney disease, liver failure, or cancer?"     Not asked 9. RECURRENT SYMPTOM: "Have you had leg swelling before?" If so, ask: "When was the last time?" "What happened that time?"     yes 10. OTHER SYMPTOMS: "Do you have any other symptoms?" (e.g., chest pain, difficulty breathing)       No- COPD patient- not worse 11. PREGNANCY: "Is there any chance you are pregnant?" "When was your last menstrual period?"       n/a  Protocols used: LEG SWELLING AND EDEMA-A-AH

## 2018-09-28 ENCOUNTER — Telehealth: Payer: Self-pay | Admitting: Internal Medicine

## 2018-09-28 MED ORDER — POTASSIUM CHLORIDE CRYS ER 20 MEQ PO TBCR: 20.0000 meq | EXTENDED_RELEASE_TABLET | Freq: Every day | ORAL | 0 refills | Status: DC

## 2018-09-28 MED ORDER — MUSCLE RUB 10-15 % EX CREA: 1.0000 "application " | TOPICAL_CREAM | Freq: Two times a day (BID) | CUTANEOUS | 0 refills | Status: DC | PRN

## 2018-09-28 MED ORDER — SPIRONOLACTONE 25 MG PO TABS: 25.0000 mg | ORAL_TABLET | Freq: Every day | ORAL | 1 refills | Status: DC

## 2018-09-28 MED ORDER — FLUTICASONE PROPIONATE 50 MCG/ACT NA SUSP: NASAL | 1 refills | Status: DC

## 2018-09-28 MED ORDER — TAMSULOSIN HCL 0.4 MG PO CAPS: 0.4000 mg | ORAL_CAPSULE | Freq: Every day | ORAL | 1 refills | Status: DC

## 2018-09-28 MED ORDER — FUROSEMIDE 80 MG PO TABS: 80.0000 mg | ORAL_TABLET | Freq: Every day | ORAL | 0 refills | Status: DC

## 2018-09-28 NOTE — Telephone Encounter (Signed)
LVM informing patient rx has been sent

## 2018-09-28 NOTE — Telephone Encounter (Signed)
Is it okay to refill these medications for patient

## 2018-09-28 NOTE — Telephone Encounter (Signed)
Can take 81 mg aspirin otc if desired. No rx needed for that.

## 2018-09-28 NOTE — Telephone Encounter (Signed)
Copied from [place] [phone]. Topic: Quick Communication - Rx Refill/Question >> Sep 28, 2018  8:29 AM Alanda Slim E wrote: [name] had his medications in a cookie tin and believes someone has threw it away or taken it and is in need of  a refill for the medications below/ Pt asked for Bubba Hales to give him a call when they are sent to the drug store/ his main concern is for the meds dealing with his urine  water that causes swelling/ please advise    Medication: fluticasone (FLONASE) 50 MCG/ACT nasal spray  furosemide (LASIX) 80 MG tablet Menthol-Methyl Salicylate (MUSCLE RUB) 10-15 % CREA potassium chloride SA (KLOR-CON M20) 20 MEQ tablet spironolactone (ALDACTONE) 25 MG tablet tamsulosin (FLOMAX) 0.4 MG CAPS capsule  Has the patient contacted their pharmacy? No   Preferred Pharmacy (with phone number or street name):   [id number] [place], [place]. [phone] (Phone) [phone] (Fax)    Agent: Please be advised that RX refills may take up to 3 business days. We ask that you follow-up with your pharmacy.

## 2018-09-28 NOTE — Telephone Encounter (Signed)
If he has refills at pharmacy he should be able to call them. None of these are controlled. Okay to call pharmacy and let them know it is okay to fill early.

## 2018-09-28 NOTE — Telephone Encounter (Signed)
Pt calling to check status. Pt states that he has not taken medication for two days. Pt states that he would not like to wait to get this filled. Pt also stated that he would like to get an 80 mg Asprin. Please advise.

## 2018-09-28 NOTE — Telephone Encounter (Signed)
All are actually due for refills so I can refill, but I do not see the 80mg  aspirin on patients chart should he be on this? If so is it okay to fill?

## 2018-09-29 DIAGNOSIS — Z96642 Presence of left artificial hip joint: Secondary | ICD-10-CM | POA: Diagnosis not present

## 2018-09-29 DIAGNOSIS — Z471 Aftercare following joint replacement surgery: Secondary | ICD-10-CM | POA: Diagnosis not present

## 2018-09-30 ENCOUNTER — Other Ambulatory Visit: Payer: Self-pay

## 2018-09-30 ENCOUNTER — Encounter: Payer: Self-pay | Admitting: Internal Medicine

## 2018-09-30 ENCOUNTER — Encounter: Payer: Self-pay | Admitting: *Deleted

## 2018-09-30 NOTE — Patient Outreach (Signed)
[name]) Care Management  09/30/2018  [name] Jul 25, 1940 [id number]  Successful outreach to patient regarding social work referral.  Referral stated:  "[facility] Phone#: [phone]. Left hip replacement, ball replaced. Needs to get a scooter - has the script from PCP, uses his walker. Son has his car & girlfriend lives in another state. Would like transportation to [place] to pick scooter out and food resources such as meals on wheels." Patient reported no longer needing ride to [facility] as his son assisted with this.  He did inquire about transportation options for future use.  He is aware of and knows how to use transportation benefits through [employer].  BSW educated him about SCAT services and offered to submit application but patient declined.  BSW submitted referral to Meals on Wheels program via [employer] [place] [employer].  BSW informed patient that the wait list is typically at least six months long for this program. Patient inquired about programs that may be able to assist with repairs to his mobile home.  BSW provided him with contact information for [facility] is closing case but provided patient with contact information in case he changes his mind about applying for SCAT or additional needs arise.  Ronn Melena, BSW Social Worker [phone]

## 2018-10-01 ENCOUNTER — Telehealth: Payer: Self-pay | Admitting: Internal Medicine

## 2018-10-01 MED ORDER — FINASTERIDE 5 MG PO TABS: 5.0000 mg | ORAL_TABLET | Freq: Every day | ORAL | 1 refills | Status: DC

## 2018-10-01 NOTE — Telephone Encounter (Signed)
Medication faxed to pharmacy as I could not e prescribe

## 2018-10-01 NOTE — Telephone Encounter (Signed)
Okay to refill? 

## 2018-10-01 NOTE — Telephone Encounter (Signed)
Copied from [place] [phone]. Topic: Quick Communication - Rx Refill/Question >> Oct 01, 2018 12:37 PM Leward Quan A wrote: Medication: finasteride (PROSCAR) 5 MG tablet   Has the patient contacted their pharmacy? Yes.   (Agent: If no, request that the patient contact the pharmacy for the refill.) (Agent: If yes, when and what did the pharmacy advise?)  Preferred Pharmacy (with phone number or street name): [id number] [place], [place]. [phone] (Phone) [phone] (Fax)    Agent: Please be advised that RX refills may take up to 3 business days. We ask that you follow-up with your pharmacy.

## 2018-10-01 NOTE — Telephone Encounter (Signed)
Is it okay to refill?

## 2018-10-06 ENCOUNTER — Encounter: Payer: Self-pay | Admitting: Internal Medicine

## 2018-10-06 ENCOUNTER — Other Ambulatory Visit: Payer: Self-pay | Admitting: Internal Medicine

## 2018-10-07 ENCOUNTER — Telehealth: Payer: Self-pay | Admitting: Cardiology

## 2018-10-07 MED ORDER — DICLOFENAC SODIUM 1 % TD GEL: 4.0000 g | Freq: Four times a day (QID) | TRANSDERMAL | 6 refills | Status: DC

## 2018-10-07 NOTE — Telephone Encounter (Signed)
Call [phone] smartphone/ my chart/ consent/ pre reg completed

## 2018-10-08 ENCOUNTER — Emergency Department (HOSPITAL_COMMUNITY): Payer: Medicare Other

## 2018-10-08 ENCOUNTER — Emergency Department (HOSPITAL_COMMUNITY): Payer: Medicare Other | Admitting: Registered Nurse

## 2018-10-08 ENCOUNTER — Encounter (HOSPITAL_COMMUNITY): Payer: Self-pay

## 2018-10-08 ENCOUNTER — Encounter (HOSPITAL_COMMUNITY)
Admission: EM | Disposition: A | Discharge status: Home / Self Care | Payer: Self-pay | Source: Home / Self Care | Attending: Emergency Medicine

## 2018-10-08 ENCOUNTER — Other Ambulatory Visit: Payer: Self-pay

## 2018-10-08 ENCOUNTER — Observation Stay (HOSPITAL_COMMUNITY)
Admission: EM | Admit: 2018-10-08 | Discharge: 2018-10-09 | Disposition: A | Discharge status: Home / Self Care | Payer: Medicare Other | Attending: Orthopedic Surgery | Admitting: Orthopedic Surgery

## 2018-10-08 DIAGNOSIS — T84021A Dislocation of internal left hip prosthesis, initial encounter: Secondary | ICD-10-CM | POA: Diagnosis not present

## 2018-10-08 DIAGNOSIS — I4891 Unspecified atrial fibrillation: Secondary | ICD-10-CM | POA: Insufficient documentation

## 2018-10-08 DIAGNOSIS — Z87891 Personal history of nicotine dependence: Secondary | ICD-10-CM | POA: Diagnosis not present

## 2018-10-08 DIAGNOSIS — K5903 Drug induced constipation: Secondary | ICD-10-CM | POA: Insufficient documentation

## 2018-10-08 DIAGNOSIS — D631 Anemia in chronic kidney disease: Secondary | ICD-10-CM | POA: Insufficient documentation

## 2018-10-08 DIAGNOSIS — I2510 Atherosclerotic heart disease of native coronary artery without angina pectoris: Secondary | ICD-10-CM | POA: Insufficient documentation

## 2018-10-08 DIAGNOSIS — Z7951 Long term (current) use of inhaled steroids: Secondary | ICD-10-CM | POA: Insufficient documentation

## 2018-10-08 DIAGNOSIS — S73005A Unspecified dislocation of left hip, initial encounter: Secondary | ICD-10-CM | POA: Diagnosis not present

## 2018-10-08 DIAGNOSIS — Z8249 Family history of ischemic heart disease and other diseases of the circulatory system: Secondary | ICD-10-CM | POA: Insufficient documentation

## 2018-10-08 DIAGNOSIS — Z791 Long term (current) use of non-steroidal anti-inflammatories (NSAID): Secondary | ICD-10-CM | POA: Insufficient documentation

## 2018-10-08 DIAGNOSIS — Z7901 Long term (current) use of anticoagulants: Secondary | ICD-10-CM | POA: Diagnosis not present

## 2018-10-08 DIAGNOSIS — I714 Abdominal aortic aneurysm, without rupture: Secondary | ICD-10-CM | POA: Insufficient documentation

## 2018-10-08 DIAGNOSIS — Z1159 Encounter for screening for other viral diseases: Secondary | ICD-10-CM | POA: Diagnosis not present

## 2018-10-08 DIAGNOSIS — Y792 Prosthetic and other implants, materials and accessory orthopedic devices associated with adverse incidents: Secondary | ICD-10-CM | POA: Insufficient documentation

## 2018-10-08 DIAGNOSIS — I499 Cardiac arrhythmia, unspecified: Secondary | ICD-10-CM | POA: Diagnosis not present

## 2018-10-08 DIAGNOSIS — Z471 Aftercare following joint replacement surgery: Secondary | ICD-10-CM | POA: Diagnosis not present

## 2018-10-08 DIAGNOSIS — Z79899 Other long term (current) drug therapy: Secondary | ICD-10-CM | POA: Diagnosis not present

## 2018-10-08 DIAGNOSIS — N183 Chronic kidney disease, stage 3 (moderate): Secondary | ICD-10-CM | POA: Diagnosis not present

## 2018-10-08 DIAGNOSIS — Z8379 Family history of other diseases of the digestive system: Secondary | ICD-10-CM | POA: Insufficient documentation

## 2018-10-08 DIAGNOSIS — Z881 Allergy status to other antibiotic agents status: Secondary | ICD-10-CM | POA: Insufficient documentation

## 2018-10-08 DIAGNOSIS — I5032 Chronic diastolic (congestive) heart failure: Secondary | ICD-10-CM | POA: Diagnosis not present

## 2018-10-08 DIAGNOSIS — E785 Hyperlipidemia, unspecified: Secondary | ICD-10-CM | POA: Diagnosis not present

## 2018-10-08 DIAGNOSIS — Z885 Allergy status to narcotic agent status: Secondary | ICD-10-CM | POA: Insufficient documentation

## 2018-10-08 DIAGNOSIS — Z951 Presence of aortocoronary bypass graft: Secondary | ICD-10-CM | POA: Diagnosis not present

## 2018-10-08 DIAGNOSIS — I10 Essential (primary) hypertension: Secondary | ICD-10-CM | POA: Diagnosis not present

## 2018-10-08 DIAGNOSIS — I491 Atrial premature depolarization: Secondary | ICD-10-CM | POA: Diagnosis not present

## 2018-10-08 DIAGNOSIS — T402X5A Adverse effect of other opioids, initial encounter: Secondary | ICD-10-CM | POA: Insufficient documentation

## 2018-10-08 DIAGNOSIS — Z419 Encounter for procedure for purposes other than remedying health state, unspecified: Secondary | ICD-10-CM

## 2018-10-08 DIAGNOSIS — Z96652 Presence of left artificial knee joint: Secondary | ICD-10-CM | POA: Diagnosis not present

## 2018-10-08 DIAGNOSIS — I130 Hypertensive heart and chronic kidney disease with heart failure and stage 1 through stage 4 chronic kidney disease, or unspecified chronic kidney disease: Secondary | ICD-10-CM | POA: Diagnosis not present

## 2018-10-08 DIAGNOSIS — Z03818 Encounter for observation for suspected exposure to other biological agents ruled out: Secondary | ICD-10-CM | POA: Diagnosis not present

## 2018-10-08 DIAGNOSIS — I6529 Occlusion and stenosis of unspecified carotid artery: Secondary | ICD-10-CM | POA: Insufficient documentation

## 2018-10-08 DIAGNOSIS — I441 Atrioventricular block, second degree: Secondary | ICD-10-CM | POA: Insufficient documentation

## 2018-10-08 DIAGNOSIS — L03116 Cellulitis of left lower limb: Secondary | ICD-10-CM | POA: Insufficient documentation

## 2018-10-08 DIAGNOSIS — J449 Chronic obstructive pulmonary disease, unspecified: Secondary | ICD-10-CM | POA: Diagnosis not present

## 2018-10-08 DIAGNOSIS — Z96642 Presence of left artificial hip joint: Secondary | ICD-10-CM | POA: Diagnosis not present

## 2018-10-08 DIAGNOSIS — Z809 Family history of malignant neoplasm, unspecified: Secondary | ICD-10-CM | POA: Insufficient documentation

## 2018-10-08 DIAGNOSIS — I252 Old myocardial infarction: Secondary | ICD-10-CM | POA: Diagnosis not present

## 2018-10-08 DIAGNOSIS — Z7982 Long term (current) use of aspirin: Secondary | ICD-10-CM | POA: Diagnosis not present

## 2018-10-08 DIAGNOSIS — R52 Pain, unspecified: Secondary | ICD-10-CM | POA: Diagnosis not present

## 2018-10-08 DIAGNOSIS — Z91013 Allergy to seafood: Secondary | ICD-10-CM | POA: Insufficient documentation

## 2018-10-08 DIAGNOSIS — Z886 Allergy status to analgesic agent status: Secondary | ICD-10-CM | POA: Insufficient documentation

## 2018-10-08 DIAGNOSIS — K7460 Unspecified cirrhosis of liver: Secondary | ICD-10-CM | POA: Insufficient documentation

## 2018-10-08 DIAGNOSIS — H5462 Unqualified visual loss, left eye, normal vision right eye: Secondary | ICD-10-CM | POA: Insufficient documentation

## 2018-10-08 DIAGNOSIS — G709 Myoneural disorder, unspecified: Secondary | ICD-10-CM | POA: Insufficient documentation

## 2018-10-08 DIAGNOSIS — Z888 Allergy status to other drugs, medicaments and biological substances status: Secondary | ICD-10-CM | POA: Insufficient documentation

## 2018-10-08 HISTORY — PX: HIP CLOSED REDUCTION: SHX983

## 2018-10-08 LAB — CBC WITH DIFFERENTIAL/PLATELET
Abs Immature Granulocytes: 0.02 K/uL (ref 0.00–0.07)
Basophils Absolute: 0 K/uL (ref 0.0–0.1)
Basophils Relative: 1 %
Eosinophils Absolute: 0.1 K/uL (ref 0.0–0.5)
Eosinophils Relative: 2 %
HCT: 33.1 % — ABNORMAL LOW (ref 39.0–52.0)
Hemoglobin: 10.4 g/dL — ABNORMAL LOW (ref 13.0–17.0)
Immature Granulocytes: 0 %
Lymphocytes Relative: 10 %
Lymphs Abs: 0.6 K/uL — ABNORMAL LOW (ref 0.7–4.0)
MCH: 28.2 pg (ref 26.0–34.0)
MCHC: 31.4 g/dL (ref 30.0–36.0)
MCV: 89.7 fL (ref 80.0–100.0)
Monocytes Absolute: 0.4 K/uL (ref 0.1–1.0)
Monocytes Relative: 7 %
Neutro Abs: 5 K/uL (ref 1.7–7.7)
Neutrophils Relative %: 80 %
Platelets: 275 K/uL (ref 150–400)
RBC: 3.69 MIL/uL — ABNORMAL LOW (ref 4.22–5.81)
RDW: 14.6 % (ref 11.5–15.5)
WBC: 6.2 K/uL (ref 4.0–10.5)
nRBC: 0 % (ref 0.0–0.2)

## 2018-10-08 LAB — BASIC METABOLIC PANEL WITH GFR
Anion gap: 11 (ref 5–15)
BUN: 57 mg/dL — ABNORMAL HIGH (ref 8–23)
CO2: 23 mmol/L (ref 22–32)
Calcium: 8.7 mg/dL — ABNORMAL LOW (ref 8.9–10.3)
Chloride: 104 mmol/L (ref 98–111)
Creatinine, Ser: 1.64 mg/dL — ABNORMAL HIGH (ref 0.61–1.24)
GFR calc Af Amer: 47 mL/min — ABNORMAL LOW (ref >60)
GFR calc non Af Amer: 40 mL/min — ABNORMAL LOW (ref >60)
Glucose, Bld: 168 mg/dL — ABNORMAL HIGH (ref 70–99)
Potassium: 4.1 mmol/L (ref 3.5–5.1)
Sodium: 138 mmol/L (ref 135–145)

## 2018-10-08 LAB — SARS CORONAVIRUS 2 BY RT PCR (HOSPITAL ORDER, PERFORMED IN ~~LOC~~ HOSPITAL LAB): SARS Coronavirus 2: NEGATIVE

## 2018-10-08 SURGERY — CLOSED MANIPULATION, JOINT, HIP
Anesthesia: General | Site: Hip | Laterality: Left

## 2018-10-08 MED ORDER — LACTATED RINGERS IV SOLN
INTRAVENOUS | Status: DC | PRN
  Administered 2018-10-08: 23:00:00 via INTRAVENOUS

## 2018-10-08 MED ORDER — PROPOFOL 10 MG/ML IV BOLUS
INTRAVENOUS | Status: AC
  Filled 2018-10-08: qty 20

## 2018-10-08 MED ORDER — FENTANYL CITRATE (PF) 100 MCG/2ML IJ SOLN
INTRAMUSCULAR | Status: DC | PRN
  Administered 2018-10-08 (×4): 50 ug via INTRAVENOUS

## 2018-10-08 MED ORDER — PROPOFOL 10 MG/ML IV BOLUS
INTRAVENOUS | Status: DC | PRN
  Administered 2018-10-08: 200 mg via INTRAVENOUS

## 2018-10-08 MED ORDER — FENTANYL CITRATE (PF) 100 MCG/2ML IJ SOLN
100.0000 ug | Freq: Once | INTRAMUSCULAR | Status: AC
  Administered 2018-10-08: 100 ug via INTRAVENOUS
  Filled 2018-10-08: qty 2

## 2018-10-08 MED ORDER — FENTANYL CITRATE (PF) 100 MCG/2ML IJ SOLN
INTRAMUSCULAR | Status: AC
  Filled 2018-10-08: qty 2

## 2018-10-08 MED ORDER — DEXAMETHASONE SODIUM PHOSPHATE 10 MG/ML IJ SOLN
INTRAMUSCULAR | Status: AC
  Filled 2018-10-08: qty 1

## 2018-10-08 MED ORDER — PROPOFOL 10 MG/ML IV BOLUS
0.5000 mg/kg | Freq: Once | INTRAVENOUS | Status: AC
  Administered 2018-10-08: 37.2 mg via INTRAVENOUS
  Filled 2018-10-08: qty 20

## 2018-10-08 MED ORDER — LIDOCAINE HCL (CARDIAC) PF 50 MG/5ML IV SOSY
PREFILLED_SYRINGE | INTRAVENOUS | Status: DC | PRN
  Administered 2018-10-08: 100 mg via INTRAVENOUS

## 2018-10-08 MED ORDER — ONDANSETRON HCL 4 MG/2ML IJ SOLN
INTRAMUSCULAR | Status: AC
  Filled 2018-10-08: qty 2

## 2018-10-08 MED ORDER — PROMETHAZINE HCL 25 MG/ML IJ SOLN: 6.2500 mg | INTRAMUSCULAR | Status: DC | PRN

## 2018-10-08 MED ORDER — SUCCINYLCHOLINE CHLORIDE 20 MG/ML IJ SOLN
INTRAMUSCULAR | Status: DC | PRN
  Administered 2018-10-08: 140 mg via INTRAVENOUS
  Administered 2018-10-08: 60 mg via INTRAVENOUS

## 2018-10-08 MED ORDER — MORPHINE SULFATE (PF) 4 MG/ML IV SOLN
4.0000 mg | Freq: Once | INTRAVENOUS | Status: AC
  Administered 2018-10-08: 4 mg via INTRAVENOUS
  Filled 2018-10-08: qty 1

## 2018-10-08 MED ORDER — ENSURE PRE-SURGERY PO LIQD: 296.0000 mL | Freq: Once | ORAL | Status: DC

## 2018-10-08 MED ORDER — CHLORHEXIDINE GLUCONATE 4 % EX LIQD: 60.0000 mL | Freq: Once | CUTANEOUS | Status: DC

## 2018-10-08 MED ORDER — POVIDONE-IODINE 10 % EX SWAB: 2.0000 "application " | Freq: Once | CUTANEOUS | Status: DC

## 2018-10-08 MED ORDER — ONDANSETRON HCL 4 MG/2ML IJ SOLN
INTRAMUSCULAR | Status: DC | PRN
  Administered 2018-10-08: 4 mg via INTRAVENOUS

## 2018-10-08 MED ORDER — FENTANYL CITRATE (PF) 100 MCG/2ML IJ SOLN
25.0000 ug | INTRAMUSCULAR | Status: DC | PRN
  Administered 2018-10-09: 25 ug via INTRAVENOUS

## 2018-10-08 MED ORDER — DEXAMETHASONE SODIUM PHOSPHATE 10 MG/ML IJ SOLN
INTRAMUSCULAR | Status: DC | PRN
  Administered 2018-10-08: 10 mg via INTRAVENOUS

## 2018-10-08 SURGICAL SUPPLY — 2 items
PILLOW ABDUCTION MEDIUM (MISCELLANEOUS) ×3 IMPLANT
SLIPPER FALL RISK YELLOW XXL (MISCELLANEOUS) ×3 IMPLANT

## 2018-10-08 NOTE — ED Provider Notes (Signed)
[facility] DEPT Provider Note   CSN: [id number] Arrival date & time: 10/08/18  [place]     History   Chief Complaint Chief Complaint  Patient presents with  . Hip Pain    HPI [name] is a 78 y.o. male.  He is complaining of severe left hip pain after he felt it pop out of position while he was reaching over while seated to get something off the floor.  This is happened before to him.  He was given fentanyl with minimal improvement in his pain.  He rates the pain is severe located in his left hip.  He is also noticed some redness and tightening of his left lower leg and states he has been treated with antibiotics for cellulitis.  Denies any fever.     The history is provided by the patient.  Hip Pain This is a new problem. The current episode started less than 1 hour ago. The problem occurs constantly. The problem has not changed since onset.Pertinent negatives include no chest pain, no abdominal pain, no headaches and no shortness of breath. The symptoms are aggravated by bending and twisting. Nothing relieves the symptoms. He has tried nothing for the symptoms. The treatment provided no relief.    Past Medical History:  Diagnosis Date  . AAA (abdominal aortic aneurysm) ([place])   . Blindness of left eye   . 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 CAD (coronary artery disease)    a. s/p CABG in 10/2008 with LIMA-LAD, SVG-OM1 with Y-graft to PL branch, and SVG-RCA  . Carotid stenosis   . Cerebrovascular disease   . CHF (congestive heart failure) ([place])   . Cirrhosis ([place])    on CT a/p 07/2013, no longer drinking  . CONGENITAL UNSPEC REDUCTION DEFORMITY LOWER LIMB   . Constipation due to opioid therapy 2014   began about a year ago  . COPD (chronic obstructive pulmonary disease) ([place])   . Foley catheter in place   . HTN (hypertension)   . Hyperlipidemia   . Mobitz type 1 second degree atrioventricular block  09/06/2013  . 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 Neuromuscular disorder ([place])    PT STATES HE HAS BEEN TOLD HE EITHER HAD POLIO OR CEREBRAL PALSY - STATES HIS LEFT LEG IS SHORTER AND AFFECTS HIS BALANCE - HE WEARS ELEVATED SHOE -LEFT  . Pain    BACK, LEGS AND ARMS  . Shortness of breath    EXERTION  . TOBACCO USE, QUIT     Patient Active Problem List   Diagnosis Date Noted  . Olecranon bursitis of left elbow 07/02/2018  . Failed total hip arthroplasty with dislocation, initial encounter ([place]) 06/22/2018  . Closed left hip fracture, initial encounter ([place]) 01/26/2018  . Neck pain 12/18/2017  . Right hip pain 06/22/2017  . Chronic diastolic heart failure ([place]) 03/16/2017  . CKD (chronic kidney disease) stage 3, GFR 30-59 ml/min ([facility]) 03/16/2017  . Diastolic dysfunction 08/67/6195  . Opiate dependence ([place]) 02/13/2017  . Uremia 01/21/2017  . Acute renal failure superimposed on stage 3 chronic kidney disease ([place]) 01/21/2017  . Atrial flutter ([place]) 11/24/2014  . Aortic stenosis 03/24/2014  . Second degree AV block, Mobitz type I 09/06/2013  . Cirrhosis ([place]) 07/28/2013  . Bladder outlet obstruction 07/28/2013  . Impaired glucose metabolism   . Chronic back pain   . Benign neoplasm of colon 01/08/2011  . Abdominal aortic aneurysm ([place]) 08/12/2010  .  TOBACCO USE, QUIT 04/11/2009  . Occlusion and stenosis of carotid artery 01/08/2009  . Hyperlipidemia 11/13/2008  . ANEMIA 11/13/2008  . Essential hypertension 11/13/2008  . Coronary atherosclerosis 11/13/2008  . COPD (chronic obstructive pulmonary disease) ([place]) 11/13/2008    Past Surgical History:  Procedure Laterality Date  . CORONARY ARTERY BYPASS GRAFT  11/03/2008   Ricard Dillon - x4: left internal mammary artery to the distal left anterior descending, saphemous vein graft to the first circumflex marginal branch with a Y graft sequentiallly to a left posterolateral branch, saphenous vein graft to the distal right  coronary artery  . ELECTROPHYSIOLOGIC STUDY N/A 01/18/2015   Procedure: A-Flutter Ablation;  Surgeon: Deboraha Sprang, MD;  Location: [facility];  Service: Cardiovascular;  Laterality: N/A;  . GREEN LIGHT LASER TURP (TRANSURETHRAL RESECTION OF PROSTATE N/A 02/14/2014   Procedure: GREEN LIGHT LASER TURP (TRANSURETHRAL RESECTION OF PROSTATE;  Surgeon: Festus Aloe, MD;  Location: [facility];  Service: Urology;  Laterality: N/A;  . HIP ARTHROPLASTY Left 01/26/2018   Procedure: LEFT HIP POSTERIOR HEMIARTHROPLASTY;  Surgeon: [name], MD;  Location: [facility];  Service: Orthopedics;  Laterality: Left;  . HIP CLOSED REDUCTION Left 09/18/2018   Procedure: CLOSED MANIPULATION HIP;  Surgeon: Justice Britain, MD;  Location: [facility];  Service: Orthopedics;  Laterality: Left;  . TOTAL HIP REVISION Left 06/24/2018   Procedure: ACETABULAR HIP REVISION POSTERIOR;  Surgeon: [name], MD;  Location: [facility];  Service: Orthopedics;  Laterality: Left;        Home Medications    Prior to Admission medications   Medication Sig Start Date End Date Taking? Authorizing Provider  albuterol (VENTOLIN HFA) 108 (90 Base) MCG/ACT inhaler Inhale 2 puffs into the lungs every 6 (six) hours as needed for wheezing or shortness of breath. 09/06/18   [name], MD  diclofenac sodium (VOLTAREN) 1 % GEL Apply 4 g topically 4 (four) times daily. 10/07/18   [name], MD  ferrous sulfate 325 (65 FE) MG tablet Take 1 tablet (325 mg total) by mouth 2 (two) times daily with a meal. 06/28/18   Kayleen Memos, DO  finasteride (PROSCAR) 5 MG tablet Take 1 tablet (5 mg total) by mouth daily. 10/01/18   [name], MD  fluticasone [facility]) 50 MCG/ACT nasal spray PLACE 1 SPRAY INTO BOTH NOSTRILS EVERY MORNING. 09/28/18   [name], MD  furosemide (LASIX) 80 MG tablet Take 1 tablet (80 mg total) by mouth daily. 09/28/18   [name], MD  Guaifenesin 1200 MG TB12 Take 1 tablet (1,200 mg  total) by mouth 2 (two) times daily. 02/15/18   [name], MD  lactulose (CHRONULAC) 10 GM/15ML solution TAKE 30 MLS BY MOUTH 2 TIMES DAILY AS NEEDED FOR MILD CONSTIPATION OR MODERATE CONSTIPATION Patient taking differently: Take 20 g by mouth 2 (two) times daily as needed for mild constipation.  06/01/18   [name], MD  lidocaine (LIDODERM) 5 % Place 2 patches onto the skin daily. Remove & Discard patch within 12 hours or as directed by MD Patient not taking: Reported on 07/02/2018 06/28/18   Kayleen Memos, DO  lidocaine (XYLOCAINE) 5 % ointment Apply topically 2 (two) times daily as needed for moderate pain. 06/28/18   Kayleen Memos, DO  Menthol-Methyl Salicylate (MUSCLE RUB) 10-15 % CREA Apply 1 application topically 2 (two) times daily as needed for muscle pain. 09/28/18   [name], MD  methocarbamol (ROBAXIN) 500 MG tablet  Take 1 tablet (500 mg total) by mouth every 6 (six) hours as needed for muscle spasms. 06/25/18   Danae Orleans, PA-C  Naloxone HCl (NARCAN NA) Place 1 application into the nose once.    [provider]  nystatin ointment (MYCOSTATIN) Apply 1 application topically 2 (two) times daily. 04/15/18   [name], MD  oxyCODONE (ROXICODONE) 30 MG immediate release tablet Take 1 tablet (30 mg total) by mouth 5 (five) times daily as needed for moderate pain or severe pain. 06/25/18   Danae Orleans, PA-C  polyethylene glycol (MIRALAX / GLYCOLAX) packet Take 17 g by mouth daily. Patient not taking: Reported on 07/02/2018 06/28/18   Kayleen Memos, DO  potassium chloride SA (KLOR-CON M20) 20 MEQ tablet Take 1 tablet (20 mEq total) by mouth daily. 09/28/18   [name], MD  pravastatin (PRAVACHOL) 20 MG tablet TAKE 1 TABLET BY MOUTH EVERY DAY Patient taking differently: Take 20 mg by mouth daily.  01/12/18   [name], MD  sertraline (ZOLOFT) 25 MG tablet TAKE 1 TABLET BY MOUTH EVERYDAY AT BEDTIME Patient taking  differently: Take 25 mg by mouth at bedtime as needed (sleep).  06/22/18   [name], MD  spironolactone (ALDACTONE) 25 MG tablet Take 1 tablet (25 mg total) by mouth daily. 09/28/18   [name], MD  tamsulosin (FLOMAX) 0.4 MG CAPS capsule Take 1 capsule (0.4 mg total) by mouth daily. 09/28/18   [name], MD    Family History Family History  Problem Relation Age of Onset  . Cirrhosis Mother        died at 34  . Heart attack Father   . Other Brother        GSW  . Other Brother        died in house fire  . Cancer Brother        unsure of type Believes colon or prostate/fim  . Colon cancer Neg Hx   . Stomach cancer Neg Hx     Social History Social History   Tobacco Use  . Smoking status: Former Research scientist (life sciences)  . Smokeless tobacco: Never Used  Substance Use Topics  . Alcohol use: No    Alcohol/week: 0.0 standard drinks    Comment: History of heavy alcohol use per pt. Quit many years ago1/1/ 2005  . Drug use: No     Allergies   Doxycycline, Amlodipine, Fish allergy, Hydrocodone, Other, and Tylenol [acetaminophen]   Review of Systems Review of Systems  Constitutional: Negative for fever.  HENT: Negative for sore throat.   Eyes: Negative for pain.  Respiratory: Negative for shortness of breath.   Cardiovascular: Negative for chest pain.  Gastrointestinal: Negative for abdominal pain.  Genitourinary: Negative for dysuria.  Musculoskeletal: Negative for neck pain.  Skin: Positive for wound. Negative for rash.  Neurological: Negative for headaches.     Physical Exam Updated Vital Signs BP (!) 179/75 (BP Location: Right Arm)   Pulse 67   Temp 98.1 F (36.7 C) (Oral)   Resp 19   SpO2 100%   Physical Exam Vitals signs and nursing note reviewed.  Constitutional:      Appearance: He is well-developed.  HENT:     Head: Normocephalic and atraumatic.  Eyes:     Conjunctiva/sclera: Conjunctivae normal.  Neck:     Musculoskeletal: Neck  supple.  Cardiovascular:     Rate and Rhythm: Regular rhythm. Bradycardia present.     Heart sounds: No murmur.  Pulmonary:     Effort: Pulmonary effort is normal. No respiratory distress.     Breath sounds: Normal breath sounds.  Abdominal:     Palpations: Abdomen is soft.     Tenderness: There is no abdominal tenderness.  Musculoskeletal:     Right lower leg: Edema present.     Left lower leg: Edema present.     Comments: He has a deformity of his proximal femur at the hip.  I am unable to range of motion this secondary to pain.  He also has some edema and erythema of his left lower leg with 2 large shallow ulcerations each approximately 5 cm in diameter.  He said he is on antibiotics for this.  Skin:    General: Skin is warm and dry.     Capillary Refill: Capillary refill takes less than 2 seconds.  Neurological:     General: No focal deficit present.     Mental Status: He is alert.      ED Treatments / Results  Labs (all labs ordered are listed, but only abnormal results are displayed) Labs Reviewed  BASIC METABOLIC PANEL - Abnormal; Notable for the following components:      Result Value   Glucose, Bld 168 (*)    BUN 57 (*)    Creatinine, Ser 1.64 (*)    Calcium 8.7 (*)    GFR calc non Af Amer 40 (*)    GFR calc Af Amer 47 (*)    All other components within normal limits  CBC WITH DIFFERENTIAL/PLATELET - Abnormal; Notable for the following components:   RBC 3.69 (*)    Hemoglobin 10.4 (*)    HCT 33.1 (*)    Lymphs Abs 0.6 (*)    All other components within normal limits  GLUCOSE, CAPILLARY - Abnormal; Notable for the following components:   Glucose-Capillary 142 (*)    All other components within normal limits  SARS CORONAVIRUS 2 (HOSPITAL ORDER, [facility])  MRSA PCR SCREENING    EKG None  Radiology Dg Pelvis 1-2 Views  Result Date: 10/09/2018 CLINICAL DATA:  Status post closed manipulation of left hip in the operating room.  EXAM: PELVIS - 1-2 VIEW COMPARISON:  Radiograph earlier this day. FINDINGS: Femoral head component is been relocated and now is seated in the acetabular component. No visualized fracture. Portions of the iliac wings excluded from the field of view. IMPRESSION: Relocation of prior left hip arthroplasty dislocation. No visualized fracture. Electronically Signed   By: Keith Rake M.D.   On: 10/09/2018 01:01   Dg Hip Unilat With Pelvis 2-3 Views Left  Result Date: 10/08/2018 CLINICAL DATA:  Patient reached picked up something and felt a pop in his hip. EXAM: DG HIP (WITH OR WITHOUT PELVIS) 2-3V LEFT COMPARISON:  None. FINDINGS: There is a dislocation of the patient's LEFT total hip arthroplasty. The ball is displaced superiorly on the cup. IMPRESSION: Dislocated LEFT total hip arthroplasty.  No visible fracture. Recommend repeat imaging post reduction Electronically Signed   By: Staci Righter M.D.   On: 10/08/2018 19:55    Procedures .Sedation  Date/Time: 10/09/2018 9:12 AM Performed by: Hayden Rasmussen, MD Authorized by: Hayden Rasmussen, MD   Consent:    Consent obtained:  Verbal and written   Consent given by:  Patient   Risks discussed:  Allergic reaction, dysrhythmia, inadequate sedation, nausea, prolonged hypoxia resulting in organ damage, prolonged sedation necessitating reversal, respiratory compromise necessitating ventilatory assistance and intubation  and vomiting   Alternatives discussed:  Analgesia without sedation, anxiolysis and regional anesthesia Universal protocol:    Procedure explained and questions answered to patient or proxy's satisfaction: yes     Relevant documents present and verified: yes     Test results available and properly labeled: yes     Imaging studies available: yes     Required blood products, implants, devices, and special equipment available: yes     Site/side marked: yes     Immediately prior to procedure a time out was called: yes     Patient  identity confirmation method:  Verbally with patient Indications:    Procedure necessitating sedation performed by:  Physician performing sedation Pre-sedation assessment:    Time since last food or drink:  4 hours   ASA classification: class 3 - patient with severe systemic disease     Neck mobility: normal     Mouth opening:  3 or more finger widths   Thyromental distance:  4 finger widths   Mallampati score:  III - soft palate, base of uvula visible   Pre-sedation assessments completed and reviewed: airway patency, cardiovascular function, hydration status, mental status, nausea/vomiting, pain level, respiratory function and temperature     Pre-sedation assessment completed:  10/08/2018 8:30 PM Immediate pre-procedure details:    Reassessment: Patient reassessed immediately prior to procedure     Reviewed: vital signs, relevant labs/tests and NPO status     Verified: bag valve mask available, emergency equipment available, intubation equipment available, IV patency confirmed, oxygen available and suction available   Procedure details (see MAR for exact dosages):    Preoxygenation:  Nasal cannula   Sedation:  Propofol   Intra-procedure monitoring:  Blood pressure monitoring, cardiac monitor, continuous pulse oximetry, frequent LOC assessments, frequent vital sign checks and continuous capnometry   Intra-procedure events: none     Total Provider sedation time (minutes):  10 Post-procedure details:    Post-sedation assessment completed:  10/08/2018 9:20 PM   Attendance: Constant attendance by certified staff until patient recovered     Recovery: Patient returned to pre-procedure baseline     Post-sedation assessments completed and reviewed: airway patency, cardiovascular function, hydration status, mental status, nausea/vomiting, pain level, respiratory function and temperature     Patient is stable for discharge or admission: yes     Patient tolerance:  Tolerated well, no immediate  complications Reduction of dislocation  Date/Time: 10/09/2018 9:15 AM Performed by: Hayden Rasmussen, MD Authorized by: Hayden Rasmussen, MD  Consent: Verbal consent obtained. Written consent obtained. Consent given by: patient Patient understanding: patient states understanding of the procedure being performed Patient consent: the patient's understanding of the procedure matches consent given Procedure consent: procedure consent matches procedure scheduled Relevant documents: relevant documents present and verified Test results: test results available and properly labeled Imaging studies: imaging studies available Required items: required blood products, implants, devices, and special equipment available Patient identity confirmed: arm band, provided demographic data and verbally with patient Time out: Immediately prior to procedure a "time out" was called to verify the correct patient, procedure, equipment, support staff and site/side marked as required. Local anesthesia used: no  Anesthesia: Local anesthesia used: no  Sedation: Patient sedated: yes Sedatives: propofol Vitals: Vital signs were monitored during sedation.  Patient tolerance: patient tolerated the procedure well with no immediate complications Comments: Unsuccessful left hip reduction    (including critical care time)  Medications Ordered in ED Medications  morphine 4 MG/ML injection 4 mg (has no  administration in time range)     Initial Impression / Assessment and Plan / ED Course  I have reviewed the triage vital signs and the nursing notes.  Pertinent labs & imaging results that were available during my care of the patient were reviewed by me and considered in my medical decision making (see chart for details).  Clinical Course as of Oct 08 908  Fri Oct 08, 2018  1929 Differential diagnosis includes dislocation, periprosthetic fracture, contusion   [MB]  1929 On review of the prior records patient  was unable to successfully be reduced in the emergency department and had to go to the operating room for the last episode.  He said he had some soda around 5:00 but has not eaten dinner yet.   [MB]  2049 Tempted reduction with propofol sedation.  Ultimately was unsuccessful.   [MB]  2116 Discussed with Dr. Lyla Glassing from orthopedics who has reviewed the patient's x-rays and said he would schedule him for the OR   [MB]    Clinical Course User Index [MB] Hayden Rasmussen, MD        Final Clinical Impressions(s) / ED Diagnoses   Final diagnoses:  Dislocation of left hip, initial encounter [facility])    ED Discharge Orders    None       Hayden Rasmussen, MD 10/09/18 [phone]

## 2018-10-08 NOTE — Anesthesia Procedure Notes (Signed)
Procedure Name: Intubation Date/Time: 10/08/2018 11:35 PM Performed by: Lissa Morales, CRNA Pre-anesthesia Checklist: Patient identified, Emergency Drugs available, Suction available and Patient being monitored Patient Re-evaluated:Patient Re-evaluated prior to induction Oxygen Delivery Method: Circle system utilized Preoxygenation: Pre-oxygenation with 100% oxygen Induction Type: IV induction Ventilation: Mask ventilation without difficulty Laryngoscope Size: Mac and 4 Tube type: Oral Number of attempts: 1 Airway Equipment and Method: Stylet and Oral airway Placement Confirmation: ETT inserted through vocal cords under direct vision,  positive ETCO2 and breath sounds checked- equal and bilateral Secured at: 21 cm Tube secured with: Tape Dental Injury: Teeth and Oropharynx as per pre-operative assessment

## 2018-10-08 NOTE — ED Notes (Signed)
At 2041 37.2 mg propofol administered, patient still fighting MD, verbal order for repeat dose of 37.2 mg propofol given at 2044, attempted to reduct the left hip unsuccessfully, verbal order for 20 mg propofol, given at 2046 and reduction attempts still unsuccessful. Stopped sedation at 2048, MD to call ortho sx.

## 2018-10-08 NOTE — Anesthesia Preprocedure Evaluation (Signed)
Anesthesia Evaluation  Patient identified by MRN, date of birth, ID band Patient awake    Reviewed: Allergy & Precautions, NPO status , Patient's Chart, lab work & pertinent test results  Airway Mallampati: II  TM Distance: >3 FB     Dental  (+) Dental Advisory Given   Pulmonary COPD,  COPD inhaler, former smoker,    breath sounds clear to auscultation       Cardiovascular hypertension, Pt. on medications + CAD, + Past MI, + CABG and +CHF  + dysrhythmias Atrial Fibrillation  Rhythm:Regular Rate:Normal     Neuro/Psych  Neuromuscular disease    GI/Hepatic negative GI ROS, (+) Cirrhosis       ,   Endo/Other  negative endocrine ROS  Renal/GU Renal disease     Musculoskeletal   Abdominal   Peds  Hematology  (+) anemia ,   Anesthesia Other Findings   Reproductive/Obstetrics                             Anesthesia Physical Anesthesia Plan  ASA: III and emergent  Anesthesia Plan: General   Post-op Pain Management:    Induction: Intravenous and Rapid sequence  PONV Risk Score and Plan: 2 and Dexamethasone, Ondansetron and Treatment may vary due to age or medical condition  Airway Management Planned: Oral ETT  Additional Equipment:   Intra-op Plan:   Post-operative Plan: Extubation in OR  Informed Consent: I have reviewed the patients History and Physical, chart, labs and discussed the procedure including the risks, benefits and alternatives for the proposed anesthesia with the patient or authorized representative who has indicated his/her understanding and acceptance.     Dental advisory given  Plan Discussed with: CRNA  Anesthesia Plan Comments:         Anesthesia Quick Evaluation

## 2018-10-08 NOTE — ED Notes (Signed)
OR called for SX.

## 2018-10-08 NOTE — ED Notes (Signed)
Bed: TD32 Expected date:  Expected time:  Means of arrival:  Comments: EMS-hip dislocation

## 2018-10-08 NOTE — Consult Note (Signed)
ORTHOPAEDIC CONSULTATION  REQUESTING PHYSICIAN: No att. providers found  PCP:  [name], MD  Chief Complaint: Left hip pain  HPI: [name] is a 78 y.o. male who complains of left hip pain and inability to weight-bear.  Patient was sitting at his desk and he reached for a pair of scissors, and his left hip dislocated.  He came to the emergency department, where x-rays revealed a dislocated left total hip replacement.  He had unsuccessful attempt at closed reduction by EDP.  He has history of left hip hemiarthroplasty back in the fall 2019 after a fall which resulted in a femoral neck fracture.  He developed recurrent instability, and underwent conversion to total hip arthroplasty by Dr. Alvan Dame on 06/24/2018.  He subsequently dislocated his left total hip replacement on 09/18/2018, requiring close reduction in the operating room by Dr. Onnie Graham.  He was discharged to home.  He has been treated for left lower extremity venous stasis ulcer with cellulitis on antibiotics for about 8 days.  He has history of neuromuscular disorder and chronic weakness/stiffness.  He normally ambulates with a walker.  Past Medical History:  Diagnosis Date  . AAA (abdominal aortic aneurysm) ([place])   . Blindness of left eye   . 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 CAD (coronary artery disease)    a. s/p CABG in 10/2008 with LIMA-LAD, SVG-OM1 with Y-graft to PL branch, and SVG-RCA  . Carotid stenosis   . Cerebrovascular disease   . CHF (congestive heart failure) ([place])   . Cirrhosis ([place])    on CT a/p 07/2013, no longer drinking  . CONGENITAL UNSPEC REDUCTION DEFORMITY LOWER LIMB   . Constipation due to opioid therapy 2014   began about a year ago  . COPD (chronic obstructive pulmonary disease) ([place])   . Foley catheter in place   . HTN (hypertension)   . Hyperlipidemia   . 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 Neuromuscular disorder ([place])    PT STATES HE HAS BEEN TOLD HE EITHER HAD POLIO OR CEREBRAL PALSY - STATES HIS LEFT LEG IS SHORTER AND AFFECTS HIS BALANCE - HE WEARS ELEVATED SHOE -LEFT  . Pain    BACK, LEGS AND ARMS  . Shortness of breath    EXERTION  . TOBACCO USE, QUIT    Past Surgical History:  Procedure Laterality Date  . CORONARY ARTERY BYPASS GRAFT  11/03/2008   Ricard Dillon - x4: left internal mammary artery to the distal left anterior descending, saphemous vein graft to the first circumflex marginal branch with a Y graft sequentiallly to a left posterolateral branch, saphenous vein graft to the distal right coronary artery  . ELECTROPHYSIOLOGIC STUDY N/A 01/18/2015   Procedure: A-Flutter Ablation;  Surgeon: Deboraha Sprang, MD;  Location: [facility];  Service: Cardiovascular;  Laterality: N/A;  . GREEN LIGHT LASER TURP (TRANSURETHRAL RESECTION OF PROSTATE N/A 02/14/2014   Procedure: GREEN LIGHT LASER TURP (TRANSURETHRAL RESECTION OF PROSTATE;  Surgeon: Festus Aloe, MD;  Location: [facility];  Service: Urology;  Laterality: N/A;  . HIP ARTHROPLASTY Left 01/26/2018   Procedure: LEFT HIP POSTERIOR HEMIARTHROPLASTY;  Surgeon: [name], MD;  Location: [facility];  Service: Orthopedics;  Laterality: Left;  . HIP CLOSED REDUCTION Left 09/18/2018   Procedure: CLOSED MANIPULATION HIP;  Surgeon: Justice Britain, MD;  Location: [facility];  Service: Orthopedics;  Laterality: Left;  .  TOTAL HIP REVISION Left 06/24/2018   Procedure: ACETABULAR HIP REVISION POSTERIOR;  Surgeon: [name], MD;  Location: [facility];  Service: Orthopedics;  Laterality: Left;   Social History   Socioeconomic History  . Marital status: Widowed    Spouse name: Not on file  . Number of children: Not on file  . Years of education: Not on file  . Highest education level: Not on file  Occupational History  . Not on file  Social Needs  . Financial resource strain: Not on file  . Food  insecurity    Worry: Not on file    Inability: Not on file  . Transportation needs    Medical: Not on file    Non-medical: Not on file  Tobacco Use  . Smoking status: Former Research scientist (life sciences)  . Smokeless tobacco: Never Used  Substance and Sexual Activity  . Alcohol use: No    Alcohol/week: 0.0 standard drinks    Comment: History of heavy alcohol use per pt. Quit many years ago1/1/ 2005  . Drug use: No  . Sexual activity: Never  Lifestyle  . Physical activity    Days per week: Not on file    Minutes per session: Not on file  . Stress: Not on file  Relationships  . Social Herbalist on phone: Not on file    Gets together: Not on file    Attends religious service: Not on file    Active member of club or organization: Not on file    Attends meetings of clubs or organizations: Not on file    Relationship status: Not on file  Other Topics Concern  . Not on file  Social History Narrative   lives alone here in [place].  He has 1 son, who lives in the [place], and he has     an uncle, who lives nearby as well.    Family History  Problem Relation Age of Onset  . Cirrhosis Mother        died at 57  . Heart attack Father   . Other Brother        GSW  . Other Brother        died in house fire  . Cancer Brother        unsure of type Believes colon or prostate/fim  . Colon cancer Neg Hx   . Stomach cancer Neg Hx    Allergies  Allergen Reactions  . Doxycycline   . Amlodipine Nausea And Vomiting and Other (See Comments)    dizziness  . Fish Allergy Nausea And Vomiting    STATES HE HAS NOT EATEN ANY FISH INCLUDING SHELLFISH FOR PAST 40 YRS - IT CAUSED NAUSEA  . Hydrocodone Itching and Rash  . Other Nausea And Vomiting    STATES HE HAS NOT EATEN ANY FISH INCLUDING SHELLFISH FOR PAST 40 YRS - IT CAUSED NAUSEA  . Tylenol [Acetaminophen] Other (See Comments)    Liver problems   Prior to Admission medications   Medication Sig Start Date End Date Taking? Authorizing  Provider  albuterol (VENTOLIN HFA) 108 (90 Base) MCG/ACT inhaler Inhale 2 puffs into the lungs every 6 (six) hours as needed for wheezing or shortness of breath. 09/06/18  Yes [name], MD  aspirin EC 81 MG tablet Take 81 mg by mouth daily.   Yes [provider]  finasteride (PROSCAR) 5 MG tablet Take 1 tablet (5 mg total) by mouth daily. 10/01/18  Yes [name]  A, MD  fluticasone (FLONASE) 50 MCG/ACT nasal spray PLACE 1 SPRAY INTO BOTH NOSTRILS EVERY MORNING. Patient taking differently: Place 1 spray into both nostrils daily.  09/28/18  Yes [name], MD  furosemide (LASIX) 80 MG tablet Take 1 tablet (80 mg total) by mouth daily. 09/28/18  Yes [name], MD  Guaifenesin 1200 MG TB12 Take 1 tablet (1,200 mg total) by mouth 2 (two) times daily. 02/15/18  Yes [name], MD  lactulose (CHRONULAC) 10 GM/15ML solution TAKE 30 MLS BY MOUTH 2 TIMES DAILY AS NEEDED FOR MILD CONSTIPATION OR MODERATE CONSTIPATION Patient taking differently: Take 20 g by mouth 2 (two) times daily as needed for mild constipation.  06/01/18  Yes [name], MD  lidocaine (XYLOCAINE) 5 % ointment Apply topically 2 (two) times daily as needed for moderate pain. 06/28/18  Yes Kayleen Memos, DO  Menthol-Methyl Salicylate (MUSCLE RUB) 10-15 % CREA Apply 1 application topically 2 (two) times daily as needed for muscle pain. 09/28/18  Yes [name], MD  oxyCODONE (ROXICODONE) 30 MG immediate release tablet Take 1 tablet (30 mg total) by mouth 5 (five) times daily as needed for moderate pain or severe pain. 06/25/18  Yes Babish, Rodman Key, PA-C  potassium chloride SA (KLOR-CON M20) 20 MEQ tablet Take 1 tablet (20 mEq total) by mouth daily. 09/28/18  Yes [name], MD  pravastatin (PRAVACHOL) 20 MG tablet TAKE 1 TABLET BY MOUTH EVERY DAY Patient taking differently: Take 20 mg by mouth daily.  01/12/18  Yes [name], MD  spironolactone  (ALDACTONE) 25 MG tablet Take 1 tablet (25 mg total) by mouth daily. 09/28/18  Yes [name], MD  tamsulosin (FLOMAX) 0.4 MG CAPS capsule Take 1 capsule (0.4 mg total) by mouth daily. 09/28/18  Yes [name], MD  diclofenac sodium (VOLTAREN) 1 % GEL Apply 4 g topically 4 (four) times daily. Patient not taking: Reported on 10/08/2018 10/07/18   [name], MD  ferrous sulfate 325 (65 FE) MG tablet Take 1 tablet (325 mg total) by mouth 2 (two) times daily with a meal. 06/28/18   Hall, Carole N, DO  lidocaine (LIDODERM) 5 % Place 2 patches onto the skin daily. Remove & Discard patch within 12 hours or as directed by MD Patient not taking: Reported on 07/02/2018 06/28/18   Kayleen Memos, DO  methocarbamol (ROBAXIN) 500 MG tablet Take 1 tablet (500 mg total) by mouth every 6 (six) hours as needed for muscle spasms. 06/25/18   Danae Orleans, PA-C  Naloxone HCl (NARCAN NA) Place 1 application into the nose once.    [provider]  nystatin ointment (MYCOSTATIN) Apply 1 application topically 2 (two) times daily. Patient not taking: Reported on 10/08/2018 04/15/18   [name], MD  polyethylene glycol [facility] / Floria Raveling) packet Take 17 g by mouth daily. Patient not taking: Reported on 07/02/2018 06/28/18   Kayleen Memos, DO  sertraline (ZOLOFT) 25 MG tablet TAKE 1 TABLET BY MOUTH EVERYDAY AT BEDTIME Patient not taking: No sig reported 06/22/18   [name], MD   Dg Hip Unilat With Pelvis 2-3 Views Left  Result Date: 10/08/2018 CLINICAL DATA:  Patient reached picked up something and felt a pop in his hip. EXAM: DG HIP (WITH OR WITHOUT PELVIS) 2-3V LEFT COMPARISON:  None. FINDINGS: There is a dislocation of the patient's LEFT total hip arthroplasty. The ball is displaced superiorly on the cup. IMPRESSION: Dislocated LEFT total  hip arthroplasty.  No visible fracture. Recommend repeat imaging post reduction Electronically Signed   By: Staci Righter M.D.   On:  10/08/2018 19:55    Positive ROS: All other systems have been reviewed and were otherwise negative with the exception of those mentioned in the HPI and as above.  Physical Exam: General: Alert, no acute distress Cardiovascular: No pedal edema Respiratory: No cyanosis, no use of accessory musculature GI: No organomegaly, abdomen is soft and non-tender Skin: No lesions in the area of chief complaint Neurologic: Sensation intact distally Psychiatric: Patient is competent for consent with normal mood and affect Lymphatic: No axillary or cervical lymphadenopathy  MUSCULOSKELETAL: Examination of the left hip reveals a healed posterior incision.  His hip is flexed and rotated.  The extremity is shortened.  He has healing venous ulcers to the lower leg with surrounding cellulitis.  Neurovascularly intact.  Assessment: Dislocated left total hip arthroplasty, second occurrence. Neuromuscular disorder.  Plan: I discussed the findings with the patient.  I recommend close reduction in the operating room.  We discussed the risk, benefits, and alternatives.  We will plan to discharge home after the procedure.  He will need to follow-up with Dr. Alvan Dame next week for repeat radiographs and check of the cellulitis.  We discussed posterior hip precautions.  All questions solicited and answered.    Bertram Savin, MD Cell [phone]    10/08/2018 11:28 PM

## 2018-10-08 NOTE — ED Triage Notes (Signed)
Per EMS-patient was reaching to pick something up from seated position-popped his left hip out of joint-has done this a number of times-history of hip replacement-100 mcg of Fentanyl given in route

## 2018-10-09 ENCOUNTER — Emergency Department (HOSPITAL_COMMUNITY): Payer: Medicare Other

## 2018-10-09 ENCOUNTER — Encounter (HOSPITAL_COMMUNITY): Payer: Self-pay | Admitting: Cardiology

## 2018-10-09 DIAGNOSIS — Z471 Aftercare following joint replacement surgery: Secondary | ICD-10-CM | POA: Diagnosis not present

## 2018-10-09 DIAGNOSIS — N183 Chronic kidney disease, stage 3 (moderate): Secondary | ICD-10-CM | POA: Diagnosis not present

## 2018-10-09 DIAGNOSIS — T84021A Dislocation of internal left hip prosthesis, initial encounter: Secondary | ICD-10-CM | POA: Diagnosis not present

## 2018-10-09 DIAGNOSIS — R001 Bradycardia, unspecified: Secondary | ICD-10-CM

## 2018-10-09 DIAGNOSIS — I10 Essential (primary) hypertension: Secondary | ICD-10-CM

## 2018-10-09 DIAGNOSIS — I4819 Other persistent atrial fibrillation: Secondary | ICD-10-CM | POA: Diagnosis not present

## 2018-10-09 DIAGNOSIS — Z96652 Presence of left artificial knee joint: Secondary | ICD-10-CM | POA: Diagnosis not present

## 2018-10-09 DIAGNOSIS — S73005A Unspecified dislocation of left hip, initial encounter: Secondary | ICD-10-CM | POA: Diagnosis present

## 2018-10-09 DIAGNOSIS — I130 Hypertensive heart and chronic kidney disease with heart failure and stage 1 through stage 4 chronic kidney disease, or unspecified chronic kidney disease: Secondary | ICD-10-CM | POA: Diagnosis not present

## 2018-10-09 DIAGNOSIS — Z96642 Presence of left artificial hip joint: Secondary | ICD-10-CM | POA: Diagnosis not present

## 2018-10-09 DIAGNOSIS — Z1159 Encounter for screening for other viral diseases: Secondary | ICD-10-CM | POA: Diagnosis not present

## 2018-10-09 DIAGNOSIS — I714 Abdominal aortic aneurysm, without rupture: Secondary | ICD-10-CM | POA: Diagnosis not present

## 2018-10-09 LAB — GLUCOSE, CAPILLARY: Glucose-Capillary: 142 mg/dL — ABNORMAL HIGH (ref 70–99)

## 2018-10-09 LAB — MRSA PCR SCREENING: MRSA by PCR: NEGATIVE

## 2018-10-09 MED ORDER — OXYCODONE HCL 5 MG PO TABS
30.0000 mg | ORAL_TABLET | Freq: Every day | ORAL | Status: DC | PRN
  Administered 2018-10-09: 13:00:00 30 mg via ORAL
  Filled 2018-10-09: qty 6

## 2018-10-09 MED ORDER — FINASTERIDE 5 MG PO TABS
5.0000 mg | ORAL_TABLET | Freq: Every day | ORAL | Status: DC
  Administered 2018-10-09: 5 mg via ORAL
  Filled 2018-10-09: qty 1

## 2018-10-09 MED ORDER — POTASSIUM CHLORIDE CRYS ER 20 MEQ PO TBCR
20.0000 meq | EXTENDED_RELEASE_TABLET | Freq: Every day | ORAL | Status: DC
  Administered 2018-10-09: 20 meq via ORAL
  Filled 2018-10-09: qty 1

## 2018-10-09 MED ORDER — CEFAZOLIN SODIUM-DEXTROSE 2-4 GM/100ML-% IV SOLN
2.0000 g | Freq: Four times a day (QID) | INTRAVENOUS | Status: DC
  Administered 2018-10-09: 2 g via INTRAVENOUS
  Filled 2018-10-09 (×2): qty 100

## 2018-10-09 MED ORDER — METOCLOPRAMIDE HCL 5 MG/ML IJ SOLN: 5.0000 mg | Freq: Three times a day (TID) | INTRAMUSCULAR | Status: DC | PRN

## 2018-10-09 MED ORDER — METOCLOPRAMIDE HCL 5 MG PO TABS
5.0000 mg | ORAL_TABLET | Freq: Three times a day (TID) | ORAL | Status: DC | PRN
  Filled 2018-10-09: qty 2

## 2018-10-09 MED ORDER — FLUTICASONE PROPIONATE 50 MCG/ACT NA SUSP
1.0000 | Freq: Every day | NASAL | Status: DC
  Administered 2018-10-09: 11:00:00 1 via NASAL
  Filled 2018-10-09: qty 16

## 2018-10-09 MED ORDER — ONDANSETRON HCL 4 MG/2ML IJ SOLN: 4.0000 mg | Freq: Four times a day (QID) | INTRAMUSCULAR | Status: DC | PRN

## 2018-10-09 MED ORDER — ONDANSETRON HCL 4 MG PO TABS
4.0000 mg | ORAL_TABLET | Freq: Four times a day (QID) | ORAL | Status: DC | PRN
  Filled 2018-10-09: qty 1

## 2018-10-09 MED ORDER — OXYCODONE HCL 5 MG PO TABS: 10.0000 mg | ORAL_TABLET | ORAL | Status: DC | PRN

## 2018-10-09 MED ORDER — DOCUSATE SODIUM 100 MG PO CAPS
100.0000 mg | ORAL_CAPSULE | Freq: Two times a day (BID) | ORAL | Status: DC
  Administered 2018-10-09: 100 mg via ORAL
  Filled 2018-10-09: qty 1

## 2018-10-09 MED ORDER — ONDANSETRON HCL 4 MG PO TABS: 4.0000 mg | ORAL_TABLET | Freq: Four times a day (QID) | ORAL | Status: DC | PRN

## 2018-10-09 MED ORDER — ASPIRIN EC 81 MG PO TBEC
81.0000 mg | DELAYED_RELEASE_TABLET | Freq: Every day | ORAL | Status: DC
  Administered 2018-10-09: 81 mg via ORAL
  Filled 2018-10-09: qty 1

## 2018-10-09 MED ORDER — LACTULOSE 10 GM/15ML PO SOLN: 20.0000 g | Freq: Two times a day (BID) | ORAL | Status: DC | PRN

## 2018-10-09 MED ORDER — METOCLOPRAMIDE HCL 5 MG PO TABS: 5.0000 mg | ORAL_TABLET | Freq: Three times a day (TID) | ORAL | Status: DC | PRN

## 2018-10-09 MED ORDER — HYDROMORPHONE HCL 1 MG/ML IJ SOLN: 0.5000 mg | INTRAMUSCULAR | Status: DC | PRN

## 2018-10-09 MED ORDER — TAMSULOSIN HCL 0.4 MG PO CAPS
0.4000 mg | ORAL_CAPSULE | Freq: Every day | ORAL | Status: DC
  Administered 2018-10-09: 0.4 mg via ORAL
  Filled 2018-10-09: qty 1

## 2018-10-09 MED ORDER — ENOXAPARIN SODIUM 30 MG/0.3ML ~~LOC~~ SOLN: 30.0000 mg | SUBCUTANEOUS | Status: DC

## 2018-10-09 MED ORDER — FENTANYL CITRATE (PF) 100 MCG/2ML IJ SOLN
INTRAMUSCULAR | Status: AC
  Filled 2018-10-09: qty 2

## 2018-10-09 MED ORDER — ALBUTEROL SULFATE (2.5 MG/3ML) 0.083% IN NEBU: 3.0000 mL | INHALATION_SOLUTION | Freq: Four times a day (QID) | RESPIRATORY_TRACT | Status: DC | PRN

## 2018-10-09 MED ORDER — CEPHALEXIN 500 MG PO CAPS: 500.0000 mg | ORAL_CAPSULE | Freq: Two times a day (BID) | ORAL | Status: DC

## 2018-10-09 MED ORDER — SPIRONOLACTONE 25 MG PO TABS
25.0000 mg | ORAL_TABLET | Freq: Every day | ORAL | Status: DC
  Administered 2018-10-09: 25 mg via ORAL
  Filled 2018-10-09 (×2): qty 1

## 2018-10-09 MED ORDER — APIXABAN 5 MG PO TABS: 5.0000 mg | ORAL_TABLET | Freq: Two times a day (BID) | ORAL | 0 refills | Status: DC

## 2018-10-09 MED ORDER — OXYCODONE HCL 5 MG PO TABS: 5.0000 mg | ORAL_TABLET | ORAL | Status: DC | PRN

## 2018-10-09 MED ORDER — SODIUM CHLORIDE 0.9 % IV SOLN
INTRAVENOUS | Status: DC | PRN
  Administered 2018-10-09: 500 mL via INTRAVENOUS

## 2018-10-09 MED ORDER — ATROPINE SULFATE 0.4 MG/ML IJ SOLN
0.4000 mg | INTRAMUSCULAR | Status: DC | PRN
  Filled 2018-10-09: qty 1

## 2018-10-09 MED ORDER — APIXABAN 5 MG PO TABS: 5.0000 mg | ORAL_TABLET | Freq: Two times a day (BID) | ORAL | Status: DC

## 2018-10-09 MED ORDER — PRAVASTATIN SODIUM 20 MG PO TABS
20.0000 mg | ORAL_TABLET | Freq: Every day | ORAL | Status: DC
  Administered 2018-10-09: 20 mg via ORAL
  Filled 2018-10-09: qty 1

## 2018-10-09 NOTE — Consult Note (Addendum)
Medical Consultation   [name]  JTT:[id number]  DOB: [birth year]  DOA: 10/08/2018  PCP: [name], MD    Requesting physician:Swinteck,Brian   Reason for consultation: Bradycardia   History of Present Illness: [name] is an 78 y.o. male with history of A. fib, AV nodal block, Mobitz type I who presented with left hip pain and was admitted under orthopedic service.  X-rays revealed dislocated left total hip replacement.  Closed reduction was unsuccessful in the emergency department.  He has history of left hip hemiarthroplasty in 2019 after a fall which resulted in femoral neck fracture.  Patient underwent closed manipulation of left hip on 10/08/2018.  He became bradycardic overnight with heart rate reaching 30/min.  Medicine were consulted for evaluation of bradycardia. He has history of secondary AV block, Mobitz type I and follows with Dr. Stanford Breed.  Patient says that he is being planned for pacemaker placement in the future. Patient seen and examined the bedside this morning.  Currently hemodynamically stable.  His heart rate is in the range of 60 to 70 bpm.  EKG this morning showed atrial fibrillation.  Patient denies any lightheadedness or dizziness, chest pain or shortness of breath.  He told me that he wants to go home and follow-up with cardiologist as an outpatient.    Review of Systems:  ROS As per HPI otherwise 10 point review of systems negative.     Past Medical History: Past Medical History:  Diagnosis Date  . AAA (abdominal aortic aneurysm) ([place])   . Blindness of left eye   . 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 CAD (coronary artery disease)    a. s/p CABG in 10/2008 with LIMA-LAD, SVG-OM1 with Y-graft to PL branch, and SVG-RCA  . Carotid stenosis   . Cerebrovascular disease   . CHF (congestive heart failure) ([place])   . Cirrhosis ([place])    on CT a/p 07/2013, no longer drinking  .  CONGENITAL UNSPEC REDUCTION DEFORMITY LOWER LIMB   . Constipation due to opioid therapy 2014   began about a year ago  . COPD (chronic obstructive pulmonary disease) ([place])   . Foley catheter in place   . HTN (hypertension)   . Hyperlipidemia   . 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 Neuromuscular disorder ([place])    PT STATES HE HAS BEEN TOLD HE EITHER HAD POLIO OR CEREBRAL PALSY - STATES HIS LEFT LEG IS SHORTER AND AFFECTS HIS BALANCE - HE WEARS ELEVATED SHOE -LEFT  . Pain    BACK, LEGS AND ARMS  . Shortness of breath    EXERTION  . TOBACCO USE, QUIT     Past Surgical History: Past Surgical History:  Procedure Laterality Date  . CORONARY ARTERY BYPASS GRAFT  11/03/2008   Ricard Dillon - x4: left internal mammary artery to the distal left anterior descending, saphemous vein graft to the first circumflex marginal branch with a Y graft sequentiallly to a left posterolateral branch, saphenous vein graft to the distal right coronary artery  . ELECTROPHYSIOLOGIC STUDY N/A 01/18/2015   Procedure: A-Flutter Ablation;  Surgeon: Deboraha Sprang, MD;  Location: [facility];  Service: Cardiovascular;  Laterality: N/A;  . GREEN LIGHT LASER TURP (TRANSURETHRAL RESECTION OF PROSTATE N/A 02/14/2014   Procedure: GREEN LIGHT LASER TURP (TRANSURETHRAL RESECTION OF PROSTATE;  Surgeon: Festus Aloe, MD;  Location: [facility];  Service: Urology;  Laterality: N/A;  . HIP ARTHROPLASTY Left 01/26/2018   Procedure: LEFT HIP POSTERIOR HEMIARTHROPLASTY;  Surgeon: [name], MD;  Location: [facility];  Service: Orthopedics;  Laterality: Left;  . HIP CLOSED REDUCTION Left 09/18/2018   Procedure: CLOSED MANIPULATION HIP;  Surgeon: Justice Britain, MD;  Location: [facility];  Service: Orthopedics;  Laterality: Left;  . TOTAL HIP REVISION Left 06/24/2018   Procedure: ACETABULAR HIP REVISION POSTERIOR;  Surgeon: [name], MD;  Location: [facility];   Service: Orthopedics;  Laterality: Left;     Allergies:   Allergies  Allergen Reactions  . Doxycycline   . Amlodipine Nausea And Vomiting and Other (See Comments)    dizziness  . Fish Allergy Nausea And Vomiting    STATES HE HAS NOT EATEN ANY FISH INCLUDING SHELLFISH FOR PAST 40 YRS - IT CAUSED NAUSEA  . Hydrocodone Itching and Rash  . Other Nausea And Vomiting    STATES HE HAS NOT EATEN ANY FISH INCLUDING SHELLFISH FOR PAST 40 YRS - IT CAUSED NAUSEA  . Tylenol [Acetaminophen] Other (See Comments)    Liver problems     Social History:  reports that he has quit smoking. He has never used smokeless tobacco. He reports that he does not drink alcohol or use drugs.   Family History: Family History  Problem Relation Age of Onset  . Cirrhosis Mother        died at 64  . Heart attack Father   . Other Brother        GSW  . Other Brother        died in house fire  . Cancer Brother        unsure of type Believes colon or prostate/fim  . Colon cancer Neg Hx   . Stomach cancer Neg Hx      Physical Exam: Vitals:   10/09/18 0409 10/09/18 0455 10/09/18 0546 10/09/18 0551  BP:  (!) 152/61 (!) 160/65 (!) 143/59  Pulse: (!) 51 (!) 52 (!) 53 (!) 56  Resp:   (!) 22 14  Temp:   (!) 97.5 F (36.4 C)   TempSrc:   Oral   SpO2:   100%   Weight:        Constitutional:   Alert and awake, oriented x3, not in any acute distress. Eyes: PERLA, EOMI, irises appear normal, anicteric sclera,  ENMT: external ears and nose appear normal,Lips appears normal, oropharynx mucosa, tongue, posterior pharynx appear normal  Neck: neck appears normal, no masses, normal ROM, no thyromegaly, no JVD  CVS: Afib,S1-S2 clear, no murmur rubs or gallops, no LE edema, normal pedal pulses  Respiratory:  clear to auscultation bilaterally, no wheezing, rales or rhonchi. Respiratory effort normal. No accessory muscle use.  Abdomen: soft nontender,mildly distended, normal bowel sounds, no hepatosplenomegaly, no  hernias  Musculoskeletal: : no cyanosis, clubbing or edema noted bilaterally Neuro: Cranial nerves II-XII intact, strength normal Psych: judgement and insight appear normal, stable mood and affect, mental status Skin: 2 superficial healing ulcers on dorsal surface of left lower extremity, with surrounding erythema  Data reviewed:  I have personally reviewed following labs and imaging studies Labs:  CBC: Recent Labs  Lab 10/08/18 1935  WBC 6.2  NEUTROABS 5.0  HGB 10.4*  HCT 33.1*  MCV 89.7  PLT 096    Basic Metabolic Panel: Recent Labs  Lab 10/08/18 1935  NA 138  K 4.1  CL 104  CO2  23  GLUCOSE 168*  BUN 57*  CREATININE 1.64*  CALCIUM 8.7*   GFR Estimated Creatinine Clearance: 41 mL/min (A) (by C-G formula based on SCr of 1.64 mg/dL (H)). Liver Function Tests: No results for input(s): AST, ALT, ALKPHOS, BILITOT, PROT, ALBUMIN in the last 168 hours. No results for input(s): LIPASE, AMYLASE in the last 168 hours. No results for input(s): AMMONIA in the last 168 hours. Coagulation profile No results for input(s): INR, PROTIME in the last 168 hours.  Cardiac Enzymes: No results for input(s): CKTOTAL, CKMB, CKMBINDEX, TROPONINI in the last 168 hours. BNP: Invalid input(s): POCBNP CBG: Recent Labs  Lab 10/09/18 0555  GLUCAP 142*   D-Dimer No results for input(s): DDIMER in the last 72 hours. Hgb A1c No results for input(s): HGBA1C in the last 72 hours. Lipid Profile No results for input(s): CHOL, HDL, LDLCALC, TRIG, CHOLHDL, LDLDIRECT in the last 72 hours. Thyroid function studies No results for input(s): TSH, T4TOTAL, T3FREE, THYROIDAB in the last 72 hours.  Invalid input(s): FREET3 Anemia work up No results for input(s): VITAMINB12, FOLATE, FERRITIN, TIBC, IRON, RETICCTPCT in the last 72 hours. Urinalysis    Component Value Date/Time   COLORURINE YELLOW 01/21/2017 0450   APPEARANCEUR CLEAR 01/21/2017 0450   LABSPEC 1.011 01/21/2017 0450   PHURINE 5.0  01/21/2017 0450   GLUCOSEU NEGATIVE 01/21/2017 0450   HGBUR NEGATIVE 01/21/2017 0450   BILIRUBINUR 3+ 09/16/2017 1617   KETONESUR NEGATIVE 01/21/2017 0450   PROTEINUR Positive (A) 09/16/2017 1617   PROTEINUR NEGATIVE 01/21/2017 0450   UROBILINOGEN 2.0 (A) 09/16/2017 1617   UROBILINOGEN 1.0 07/28/2013 2005   NITRITE pos 09/16/2017 1617   NITRITE NEGATIVE 01/21/2017 0450   LEUKOCYTESUR Large (3+) (A) 09/16/2017 1617     Microbiology Recent Results (from the past 240 hour(s))  SARS Coronavirus 2 (CEPHEID - Performed in [facility] hospital lab), Hosp Order     Status: None   Collection Time: 10/08/18  9:06 PM   Specimen: Nasopharyngeal Swab  Result Value Ref Range Status   SARS Coronavirus 2 NEGATIVE NEGATIVE Final    Comment: (NOTE) If result is NEGATIVE SARS-CoV-2 target nucleic acids are NOT DETECTED. The SARS-CoV-2 RNA is generally detectable in upper and lower  respiratory specimens during the acute phase of infection. The lowest  concentration of SARS-CoV-2 viral copies this assay can detect is 250  copies / mL. A negative result does not preclude SARS-CoV-2 infection  and should not be used as the sole basis for treatment or other  patient management decisions.  A negative result may occur with  improper specimen collection / handling, submission of specimen other  than nasopharyngeal swab, presence of viral mutation(s) within the  areas targeted by this assay, and inadequate number of viral copies  (<250 copies / mL). A negative result must be combined with clinical  observations, patient history, and epidemiological information. If result is POSITIVE SARS-CoV-2 target nucleic acids are DETECTED. The SARS-CoV-2 RNA is generally detectable in upper and lower  respiratory specimens dur ing the acute phase of infection.  Positive  results are indicative of active infection with SARS-CoV-2.  Clinical  correlation with patient history and other diagnostic information is   necessary to determine patient infection status.  Positive results do  not rule out bacterial infection or co-infection with other viruses. If result is PRESUMPTIVE POSTIVE SARS-CoV-2 nucleic acids MAY BE PRESENT.   A presumptive positive result was obtained on the submitted specimen  and confirmed on repeat testing.  While  2019 novel coronavirus  (SARS-CoV-2) nucleic acids may be present in the submitted sample  additional confirmatory testing may be necessary for epidemiological  and / or clinical management purposes  to differentiate between  SARS-CoV-2 and other Sarbecovirus currently known to infect humans.  If clinically indicated additional testing with an alternate test  methodology [phone]) is advised. The SARS-CoV-2 RNA is generally  detectable in upper and lower respiratory sp ecimens during the acute  phase of infection. The expected result is Negative. Fact Sheet for Patients:  StrictlyIdeas.no Fact Sheet for Healthcare Providers: BankingDealers.co.za This test is not yet approved or cleared by the [place] FDA and has been authorized for detection and/or diagnosis of SARS-CoV-2 by FDA under an Emergency Use Authorization (EUA).  This EUA will remain in effect (meaning this test can be used) for the duration of the COVID-19 declaration under Section 564(b)(1) of the Act, 21 U.S.C. section 360bbb-3(b)(1), unless the authorization is terminated or revoked sooner. Performed at [facility], [place] [address], [place], [place] [address]        Inpatient Medications:   Scheduled Meds: . aspirin EC  81 mg Oral Daily  . docusate sodium  100 mg Oral BID  . [START ON 10/10/2018] enoxaparin (LOVENOX) injection  30 mg Subcutaneous Q24H  . fentaNYL      . finasteride  5 mg Oral Daily  . fluticasone  1 spray Each Nare Daily  . potassium chloride SA  20 mEq Oral Daily  . pravastatin  20 mg Oral Daily  .  spironolactone  25 mg Oral Daily  . tamsulosin  0.4 mg Oral Daily   Continuous Infusions: . sodium chloride 10 mL/hr at 10/09/18 0600  .  ceFAZolin (ANCEF) IV       Radiological Exams on Admission: Dg Pelvis 1-2 Views  Result Date: 10/09/2018 CLINICAL DATA:  Status post closed manipulation of left hip in the operating room. EXAM: PELVIS - 1-2 VIEW COMPARISON:  Radiograph earlier this day. FINDINGS: Femoral head component is been relocated and now is seated in the acetabular component. No visualized fracture. Portions of the iliac wings excluded from the field of view. IMPRESSION: Relocation of prior left hip arthroplasty dislocation. No visualized fracture. Electronically Signed   By: Keith Rake M.D.   On: 10/09/2018 01:01   Dg Hip Unilat With Pelvis 2-3 Views Left  Result Date: 10/08/2018 CLINICAL DATA:  Patient reached picked up something and felt a pop in his hip. EXAM: DG HIP (WITH OR WITHOUT PELVIS) 2-3V LEFT COMPARISON:  None. FINDINGS: There is a dislocation of the patient's LEFT total hip arthroplasty. The ball is displaced superiorly on the cup. IMPRESSION: Dislocated LEFT total hip arthroplasty.  No visible fracture. Recommend repeat imaging post reduction Electronically Signed   By: Staci Righter M.D.   On: 10/08/2018 19:55    Impression/Recommendations Active Problems:   Closed dislocation of left hip (HCC)    Bradycardia/history of type II AV block, Mobitz type I: He was supposed to follow up  with Dr. Stanford Breed.  Currently his heart rate is ranging 60-70/min.  He is currently asymptomatic.  I do not think that he needs any urgent intervention.  I have requested cardiology consult  and discussed with Dr. Debara Pickett who will see the patient today. He is not on any AV nodal blocking agents.  History of paroxysmal A. fib: Chadsvasc score of 5.  Patient is currently not on any anticoagulation.  As per the previous note, patient liked to  follow with cardiology as an outpatient  before decision to start  on oral anticoagulation. I will wait for cardiology's recommendation for anticoagulation.  History of chronic diastolic CHF: 2D echo done on 06/23/2018 revealed ejection fraction of more than 65%.  On Lasix at home.  CKD stage III: Currently kidney function near baseline.  History of coronary disease status post CABG: On aspirin and statin at home.  History of abdominal aortic aneurysm: Continue monitoring as an outpatient.  History of liver cirrhosis: Currently compensated.  On Lasix, Spironolactone and lactulose at home.  Hypertension: Currently blood pressure stable.  Continue his home meds.  BPH: On tamsulosin and finasteride  History of COPD: Currently stable.  Continue bronchodilators as needed.  Resume home inhalers on discharge.  Hyperlipidemia: Continue statin.  Dislocated left total hip arthroplasty:POD 1, status post closed manipulation.  Management as per orthopedics.  Cellulitis of the left lower extremity: We will recommend Keflex 500 mg 3 times a day for 5 days total.    Thank you for this consultation.  Our [facility] hospitalist team will follow the patient with you.   Time Spent: 55 mins.  Shelly Coss M.D. [facility] [phone] 10/09/2018, 9:00 AM

## 2018-10-09 NOTE — Progress Notes (Signed)
RN notified patient's son of the patient's new room number, and gave an update.

## 2018-10-09 NOTE — Transfer of Care (Signed)
Immediate Anesthesia Transfer of Care Note  Patient: [name]  Procedure(s) Performed: CLOSED MANIPULATION HIP (Left Hip)  Patient Location: PACU  Anesthesia Type:General  Level of Consciousness: awake, sedated and patient cooperative  Airway & Oxygen Therapy: Patient Spontanous Breathing and Patient connected to face mask oxygen  Post-op Assessment: Report given to RN, Post -op Vital signs reviewed and stable and Patient moving all extremities X 4  Post vital signs: stable  Last Vitals:  Vitals Value Taken Time  BP 145/79 10/09/18 0015  Temp 36.4 C 10/09/18 0015  Pulse 49 10/09/18 0016  Resp 14 10/09/18 0016  SpO2 100 % 10/09/18 0016  Vitals shown include unvalidated device data.  Last Pain:  Vitals:   10/08/18 2201  TempSrc:   PainSc: 10-Worst pain ever         Complications: No apparent anesthesia complications

## 2018-10-09 NOTE — Progress Notes (Signed)
Called d/t pt HR fluctuating into the 30's, 40's and 50's. Denies any pain, A/O and f/c.  Pt VS placed in flow sheet. EKG showed controlled a-fib.  MD made aware of pt status.  Orders to transfer pt to SDU for closer monitoring and to consult cardiology. Pt labile HR is not sustained.  Pt on 4 L . sats are 100%.

## 2018-10-09 NOTE — Progress Notes (Signed)
RN updated patient's son on patient's current status and transfer order, per patient's request. RN will call son to give new room number after the patient is transferred.

## 2018-10-09 NOTE — Discharge Instructions (Signed)
Posterior hip precautions.  Wear abduction wedge at all times. Call [phone] as soon as possible to schedule a follow up appointment this week with Dr. Alvan Dame.  Information on my medicine - ELIQUIS (apixaban)  This medication education was reviewed with me or my healthcare representative as part of my discharge preparation.  The pharmacist that spoke with me during my hospital stay was:  Biagio Borg, [facility]  Why was Eliquis prescribed for you? Eliquis was prescribed for you to reduce the risk of a blood clot forming that can cause a stroke if you have a medical condition called atrial fibrillation (a type of irregular heartbeat).  What do You need to know about Eliquis ? Take your Eliquis TWICE DAILY - one tablet in the morning and one tablet in the evening with or without food. If you have difficulty swallowing the tablet whole please discuss with your pharmacist how to take the medication safely.  Take Eliquis exactly as prescribed by your doctor and DO NOT stop taking Eliquis without talking to the doctor who prescribed the medication.  Stopping may increase your risk of developing a stroke.  Refill your prescription before you run out.  After discharge, you should have regular check-up appointments with your healthcare provider that is prescribing your Eliquis.  In the future your dose may need to be changed if your kidney function or weight changes by a significant amount or as you get older.  What do you do if you miss a dose? If you miss a dose, take it as soon as you remember on the same day and resume taking twice daily.  Do not take more than one dose of ELIQUIS at the same time to make up a missed dose.  Important Safety Information A possible side effect of Eliquis is bleeding. You should call your healthcare provider right away if you experience any of the following: ? Bleeding from an injury or your nose that does not stop. ? Unusual colored urine (red or  dark brown) or unusual colored stools (red or black). ? Unusual bruising for unknown reasons. ? A serious fall or if you hit your head (even if there is no bleeding).  Some medicines may interact with Eliquis and might increase your risk of bleeding or clotting while on Eliquis. To help avoid this, consult your healthcare provider or pharmacist prior to using any new prescription or non-prescription medications, including herbals, vitamins, non-steroidal anti-inflammatory drugs (NSAIDs) and supplements.  This website has more information on Eliquis (apixaban): http://www.eliquis.com/eliquis/home

## 2018-10-09 NOTE — Progress Notes (Signed)
RN called Rapid Response RN, Lorretta Harp, notified her that the patient's heart rate was fluctuating from the 30s to the 60s, Hx of AFIB, Oxygen level dropped to 78 and is back in the high 90s.

## 2018-10-09 NOTE — Anesthesia Postprocedure Evaluation (Signed)
Anesthesia Post Note  Patient: NORRIS BRUMBACH  Procedure(s) Performed: CLOSED MANIPULATION HIP (Left Hip)     Patient location during evaluation: PACU Anesthesia Type: General Level of consciousness: awake and alert Pain management: pain level controlled Vital Signs Assessment: post-procedure vital signs reviewed and stable Respiratory status: spontaneous breathing, nonlabored ventilation, respiratory function stable and patient connected to nasal cannula oxygen Cardiovascular status: blood pressure returned to baseline and stable Postop Assessment: no apparent nausea or vomiting Anesthetic complications: no    Last Vitals:  Vitals:   10/09/18 0300 10/09/18 0350  BP:  (!) 154/74  Pulse:  (!) 58  Resp:  16  Temp: 36.8 C 36.4 C  SpO2:  100%    Last Pain:  Vitals:   10/09/18 0350  TempSrc: Oral  PainSc:                  Tiajuana Amass

## 2018-10-09 NOTE — Progress Notes (Signed)
RN notified Dr. Lyla Glassing that the patient's heart rate was dropping in to the 60s, Hx of AFIB, Oxygen level dropped to 78 and is back in the high 90s.   Orders received to transfer the patient to a telemetry floor, with continuous cardiac monitoring.

## 2018-10-09 NOTE — Consult Note (Signed)
Cardiology Consultation:   Patient ID: [name]; [id number]; 1940-05-08   Admit date: 10/08/2018 Date of Consult: 10/09/2018  Primary Care Provider: [name], MD Primary Cardiologist: Kirk Ruths, MD  Primary Electrophysiologist:  None   Patient Profile:   [name] is a 78 y.o. 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 Lyla Glassing  History of Present Illness:   Mr. Guderian with the above history who is followed by Dr. Stanford Breed. Patient had atrial flutter ablation in 12/2014. Dr. Caryl Comes ordered at Holter Monitor in 01/2015 which showed significant pauses (one being 3.5 seconds) and heart block but patient was asleep and conservative management was recommended.  Patient had a mechanical fall on 06/12/2018 and dislocated his left hip at that time. He was seen in the ED at that time and his hip was reduced.  He presented again in March for pain and had a dislocated hip.  We saw him prior to surgery for this.  He was found to have afib with slow ventricular response on that admission.  He was discharged off of [place] pending resolution of his hip problems and assessment of his ambulation and stability.    He is now admitted with hip pain and is found to have dislocation of his hip and had closed manipulation yesterday.  He had bradycardia noted overnight.    There was a mention of his O2 sats falling but he patient says that he has felt fine.  The patient denies any new symptoms such as chest discomfort, neck or arm discomfort. There has been no new shortness of breath, PND or orthopnea. There have been no reported palpitations, presyncope or syncope.  He gets around with a walker for stability and he denies any falls.     Past Medical History:  Diagnosis Date  . AAA (abdominal aortic aneurysm) ([place])   . Blindness of left eye   . 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 CAD (coronary artery disease)    a. s/p CABG in 10/2008 with LIMA-LAD, SVG-OM1 with Y-graft to PL branch, and SVG-RCA  . Carotid stenosis   . Cerebrovascular disease   . CHF (congestive heart failure) ([place])   . Cirrhosis ([place])    on CT a/p 07/2013, no longer drinking  . CONGENITAL UNSPEC REDUCTION DEFORMITY LOWER LIMB   . Constipation due to opioid therapy 2014   began about a year ago  . COPD (chronic obstructive pulmonary disease) ([place])   . Foley catheter in place   . HTN (hypertension)   . Hyperlipidemia   . 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 Neuromuscular disorder ([place])    PT STATES HE HAS BEEN TOLD HE EITHER HAD POLIO OR CEREBRAL PALSY - STATES HIS LEFT LEG IS SHORTER AND AFFECTS HIS BALANCE - HE WEARS ELEVATED SHOE -LEFT  . Pain    BACK, LEGS AND ARMS  . Shortness of breath    EXERTION  . TOBACCO USE, QUIT     Past Surgical History:  Procedure Laterality Date  . CORONARY ARTERY BYPASS GRAFT  11/03/2008   Ricard Dillon - x4: left internal mammary artery to the distal left anterior descending,  saphemous vein graft to the first circumflex marginal branch with a Y graft sequentiallly to a left posterolateral branch, saphenous vein graft to the distal right coronary artery  . ELECTROPHYSIOLOGIC STUDY N/A 01/18/2015   Procedure: A-Flutter Ablation;  Surgeon: Deboraha Sprang, MD;  Location: [facility];  Service: Cardiovascular;  Laterality: N/A;  . GREEN LIGHT LASER TURP (TRANSURETHRAL RESECTION OF PROSTATE N/A 02/14/2014   Procedure: GREEN LIGHT LASER TURP (TRANSURETHRAL RESECTION OF PROSTATE;  Surgeon: Festus Aloe, MD;  Location: [facility];  Service: Urology;  Laterality: N/A;  . HIP ARTHROPLASTY Left 01/26/2018    Procedure: LEFT HIP POSTERIOR HEMIARTHROPLASTY;  Surgeon: [name], MD;  Location: [facility];  Service: Orthopedics;  Laterality: Left;  . HIP CLOSED REDUCTION Left 09/18/2018   Procedure: CLOSED MANIPULATION HIP;  Surgeon: Justice Britain, MD;  Location: [facility];  Service: Orthopedics;  Laterality: Left;  . TOTAL HIP REVISION Left 06/24/2018   Procedure: ACETABULAR HIP REVISION POSTERIOR;  Surgeon: [name], MD;  Location: [facility];  Service: Orthopedics;  Laterality: Left;     Home Medications:  Prior to Admission medications   Medication Sig Start Date End Date Taking? Authorizing Provider  albuterol (VENTOLIN HFA) 108 (90 Base) MCG/ACT inhaler Inhale 2 puffs into the lungs every 6 (six) hours as needed for wheezing or shortness of breath. 09/06/18  Yes [name], MD  aspirin EC 81 MG tablet Take 81 mg by mouth daily.   Yes [provider]  finasteride (PROSCAR) 5 MG tablet Take 1 tablet (5 mg total) by mouth daily. 10/01/18  Yes [name], MD  fluticasone (FLONASE) 50 MCG/ACT nasal spray PLACE 1 SPRAY INTO BOTH NOSTRILS EVERY MORNING. Patient taking differently: Place 1 spray into both nostrils daily.  09/28/18  Yes [name], MD  furosemide (LASIX) 80 MG tablet Take 1 tablet (80 mg total) by mouth daily. 09/28/18  Yes [name], MD  Guaifenesin 1200 MG TB12 Take 1 tablet (1,200 mg total) by mouth 2 (two) times daily. 02/15/18  Yes [name], MD  lactulose (CHRONULAC) 10 GM/15ML solution TAKE 30 MLS BY MOUTH 2 TIMES DAILY AS NEEDED FOR MILD CONSTIPATION OR MODERATE CONSTIPATION Patient taking differently: Take 20 g by mouth 2 (two) times daily as needed for mild constipation.  06/01/18  Yes [name], MD  lidocaine (XYLOCAINE) 5 % ointment Apply topically 2 (two) times daily as needed for moderate pain. 06/28/18  Yes Kayleen Memos, DO  Menthol-Methyl Salicylate (MUSCLE RUB) 10-15 % CREA Apply 1 application topically 2  (two) times daily as needed for muscle pain. 09/28/18  Yes [name], MD  oxyCODONE (ROXICODONE) 30 MG immediate release tablet Take 1 tablet (30 mg total) by mouth 5 (five) times daily as needed for moderate pain or severe pain. 06/25/18  Yes Babish, Rodman Key, PA-C  potassium chloride SA (KLOR-CON M20) 20 MEQ tablet Take 1 tablet (20 mEq total) by mouth daily. 09/28/18  Yes [name], MD  pravastatin (PRAVACHOL) 20 MG tablet TAKE 1 TABLET BY MOUTH EVERY DAY Patient taking differently: Take 20 mg by mouth daily.  01/12/18  Yes [name], MD  spironolactone (ALDACTONE) 25 MG tablet Take 1 tablet (25 mg total) by mouth daily. 09/28/18  Yes [name], MD  tamsulosin (FLOMAX) 0.4 MG CAPS capsule Take 1 capsule (0.4 mg total) by mouth daily. 09/28/18  Yes [name], MD  diclofenac sodium (VOLTAREN) 1 % GEL Apply 4 g  topically 4 (four) times daily. Patient not taking: Reported on 10/08/2018 10/07/18   [name], MD  ferrous sulfate 325 (65 FE) MG tablet Take 1 tablet (325 mg total) by mouth 2 (two) times daily with a meal. 06/28/18   Hall, Carole N, DO  lidocaine (LIDODERM) 5 % Place 2 patches onto the skin daily. Remove & Discard patch within 12 hours or as directed by MD Patient not taking: Reported on 07/02/2018 06/28/18   Kayleen Memos, DO  methocarbamol (ROBAXIN) 500 MG tablet Take 1 tablet (500 mg total) by mouth every 6 (six) hours as needed for muscle spasms. 06/25/18   Danae Orleans, PA-C  Naloxone HCl (NARCAN NA) Place 1 application into the nose once.    [provider]  nystatin ointment (MYCOSTATIN) Apply 1 application topically 2 (two) times daily. Patient not taking: Reported on 10/08/2018 04/15/18   [name], MD  polyethylene glycol [facility] Floria Raveling) packet Take 17 g by mouth daily. Patient not taking: Reported on 07/02/2018 06/28/18   Kayleen Memos, DO  sertraline (ZOLOFT) 25 MG tablet TAKE 1 TABLET BY MOUTH  EVERYDAY AT BEDTIME Patient not taking: No sig reported 06/22/18   [name], MD    Inpatient Medications: Scheduled Meds: . aspirin EC  81 mg Oral Daily  . cephALEXin  500 mg Oral Q12H  . docusate sodium  100 mg Oral BID  . [START ON 10/10/2018] enoxaparin (LOVENOX) injection  30 mg Subcutaneous Q24H  . fentaNYL      . finasteride  5 mg Oral Daily  . fluticasone  1 spray Each Nare Daily  . potassium chloride SA  20 mEq Oral Daily  . pravastatin  20 mg Oral Daily  . spironolactone  25 mg Oral Daily  . tamsulosin  0.4 mg Oral Daily   Continuous Infusions: . sodium chloride 10 mL/hr at 10/09/18 0600  .  ceFAZolin (ANCEF) IV     PRN Meds: sodium chloride, albuterol, lactulose, metoCLOPramide **OR** metoCLOPramide (REGLAN) injection, ondansetron **OR** ondansetron (ZOFRAN) IV, oxycodone  Allergies:    Allergies  Allergen Reactions  . Doxycycline   . Amlodipine Nausea And Vomiting and Other (See Comments)    dizziness  . Fish Allergy Nausea And Vomiting    STATES HE HAS NOT EATEN ANY FISH INCLUDING SHELLFISH FOR PAST 40 YRS - IT CAUSED NAUSEA  . Hydrocodone Itching and Rash  . Other Nausea And Vomiting    STATES HE HAS NOT EATEN ANY FISH INCLUDING SHELLFISH FOR PAST 40 YRS - IT CAUSED NAUSEA  . Tylenol [Acetaminophen] Other (See Comments)    Liver problems    Social History:   Social History   Socioeconomic History  . Marital status: Widowed    Spouse name: Not on file  . Number of children: Not on file  . Years of education: Not on file  . Highest education level: Not on file  Occupational History  . Not on file  Social Needs  . Financial resource strain: Not on file  . Food insecurity    Worry: Not on file    Inability: Not on file  . Transportation needs    Medical: Not on file    Non-medical: Not on file  Tobacco Use  . Smoking status: Former Research scientist (life sciences)  . Smokeless tobacco: Never Used  Substance and Sexual Activity  . Alcohol use: No     Alcohol/week: 0.0 standard drinks    Comment: History of heavy alcohol use per pt.  Quit many years ago1/1/ 2005  . Drug use: No  . Sexual activity: Never  Lifestyle  . Physical activity    Days per week: Not on file    Minutes per session: Not on file  . Stress: Not on file  Relationships  . Social Herbalist on phone: Not on file    Gets together: Not on file    Attends religious service: Not on file    Active member of club or organization: Not on file    Attends meetings of clubs or organizations: Not on file    Relationship status: Not on file  . Intimate partner violence    Fear of current or ex partner: Not on file    Emotionally abused: Not on file    Physically abused: Not on file    Forced sexual activity: Not on file  Other Topics Concern  . Not on file  Social History Narrative   lives alone here in [place].  He has 1 son, who lives in the [place], and he has     an uncle, who lives nearby as well.     Family History:    Family History  Problem Relation Age of Onset  . Cirrhosis Mother        died at 27  . Heart attack Father   . Other Brother        GSW  . Other Brother        died in house fire  . Cancer Brother        unsure of type Believes colon or prostate/fim  . Colon cancer Neg Hx   . Stomach cancer Neg Hx      ROS:  Please see the history of present illness.  ROS  All other ROS reviewed and negative.     Physical Exam/Data:   Vitals:   10/09/18 0409 10/09/18 0455 10/09/18 0546 10/09/18 0551  BP:  (!) 152/61 (!) 160/65 (!) 143/59  Pulse: (!) 51 (!) 52 (!) 53 (!) 56  Resp:   (!) 22 14  Temp:   (!) 97.5 F (36.4 C)   TempSrc:   Oral   SpO2:   100%   Weight:        Intake/Output Summary (Last 24 hours) at 10/09/2018 1048 Last data filed at 10/09/2018 0600 Gross per 24 hour  Intake 720.1 ml  Output 200 ml  Net 520.1 ml   Filed Weights   10/08/18 2000 10/08/18 2007  Weight: 74.4 kg 74.4 kg   Body mass index is  22.87 kg/m.  GENERAL:  Wellappearing HEENT:   Pupils equal round and reactive, fundi not visualized, oral mucosa unremarkable NECK:  No  jugular venous distention, waveform within normal limits, carotid upstroke brisk and symmetric, no bruits, no thyromegaly LYMPHATICS:  No cervical, inguinal adenopathy LUNGS:   Clear to auscultation bilaterally BACK:  No CVA tenderness CHEST:   Unremarkable HEART:  PMI not displaced or sustained,S1 and S2 within normal limits, no S3, no S4, no clicks, no rubs, positive systolic murmur at the apex, no diastolic murmurs ABD:  Flat, positive bowel sounds normal in frequency in pitch, no bruits, no rebound, no guarding, no midline pulsatile mass, no hepatomegaly, no splenomegaly EXT:  2 plus pulses upper and decreased DP/PT bilateral lower, no  edema, no cyanosis no clubbing, left pretibial ulcers SKIN:  No rashes no nodules NEURO:   Cranial nerves II through XII grossly intact, motor grossly intact throughout  PSYCH:    Cognitively intact, oriented to person place and time   EKG:  The EKG was personally reviewed and demonstrates:  Atrial fib, rate 60, PVCs, QT prolonged, no acute ST T wave changes.  Telemetry:  Telemetry was personally reviewed and demonstrates:  Atrial fib with slow rate and PVCs with couplets  Relevant CV Studies:  Echocardiogram 06/23/2018 1. The left ventricle has hyperdynamic systolic function, with an ejection fraction of >65%. The cavity size was severely dilated. There is mildly increased left ventricular wall thickness. Left ventricular diastology could not be evaluated secondary to atrial fibrillation. No evidence of left ventricular regional wall motion abnormalities. 2. The right ventricle was not well visualized. The cavity was mildly enlarged. There is not assessed. 3. Left atrial size was severely dilated. 4. Right atrial size was severely dilated. 5. The mitral valve is degenerative. Mild thickening of the mitral valve  leaflet. Mitral valve regurgitation is mild to moderate by color flow Doppler. 6. The tricuspid valve is normal in structure. 7. The aortic valve is tricuspid Mild sclerosis of the aortic valve. 8. The aortic root and ascending aorta are normal in size and structure. 9. The inferior vena cava was dilated in size with <50% respiratory variability. 10. No evidence of left ventricular regional wall motion abnormalities. 11. Possible PFO.  Laboratory Data:  Chemistry Recent Labs  Lab 10/08/18 1935  NA 138  K 4.1  CL 104  CO2 23  GLUCOSE 168*  BUN 57*  CREATININE 1.64*  CALCIUM 8.7*  GFRNONAA 40*  GFRAA 47*  ANIONGAP 11    No results for input(s): PROT, ALBUMIN, AST, ALT, ALKPHOS, BILITOT in the last 168 hours. Hematology Recent Labs  Lab 10/08/18 1935  WBC 6.2  RBC 3.69*  HGB 10.4*  HCT 33.1*  MCV 89.7  MCH 28.2  MCHC 31.4  RDW 14.6  PLT 275   Cardiac EnzymesNo results for input(s): TROPONINI in the last 168 hours. No results for input(s): TROPIPOC in the last 168 hours.  BNPNo results for input(s): BNP, PROBNP in the last 168 hours.  DDimer No results for input(s): DDIMER in the last 168 hours.  Radiology/Studies:  Dg Pelvis 1-2 Views  Result Date: 10/09/2018 CLINICAL DATA:  Status post closed manipulation of left hip in the operating room. EXAM: PELVIS - 1-2 VIEW COMPARISON:  Radiograph earlier this day. FINDINGS: Femoral head component is been relocated and now is seated in the acetabular component. No visualized fracture. Portions of the iliac wings excluded from the field of view. IMPRESSION: Relocation of prior left hip arthroplasty dislocation. No visualized fracture. Electronically Signed   By: Keith Rake M.D.   On: 10/09/2018 01:01   Dg Hip Unilat With Pelvis 2-3 Views Left  Result Date: 10/08/2018 CLINICAL DATA:  Patient reached picked up something and felt a pop in his hip. EXAM: DG HIP (WITH OR WITHOUT PELVIS) 2-3V LEFT COMPARISON:  None.  FINDINGS: There is a dislocation of the patient's LEFT total hip arthroplasty. The ball is displaced superiorly on the cup. IMPRESSION: Dislocated LEFT total hip arthroplasty.  No visible fracture. Recommend repeat imaging post reduction Electronically Signed   By: Staci Righter M.D.   On: 10/08/2018 19:55    Assessment and Plan:   BRADYCARDIA:  I do not bradycardia but no sustained pauses and he has no symptoms with this.  I verified with the patient and called his son.  He does not have syncope.  No indication for pacing at this point.  Avoid all AV nodal blocking agents  ATRIAL FIB:  Mr. CHRISTHOPER BUSBEE has a CHA2DS2 - VASc score of 4 (at least).  I verified with the patient and his son that he has no absolute contraindication to DOAC.  Stop ASA at discharge.  I will consult pharm to get him samples and free card and started on Eliquis.  Discussed risk benefits at length with the patient and the son    CKD III:  Stable.    CAD:    No evidence of acute ischemic event.   HTN:  BP is elevated.  I would suggest restart previous meds and have the patient keep a home health BP diary.     For questions or updates, please contact [place] Please consult [email or web address] for contact info under Cardiology/STEMI.   Signed, Minus Breeding, MD  10/09/2018 10:48 AM

## 2018-10-09 NOTE — Progress Notes (Signed)
Subjective: 1 Day Post-Op Procedure(s) (LRB): CLOSED MANIPULATION HIP (Left)  Complicated by post-operative bradycardia Patient reports pain as 0 on 0-10 scale.   +void + Flatus, -BM; no abdominal pain nor distention.  Tolerating PO intake without N/V. Denies HA, dizziness, CP, SOB, calf pain.   Objective: Vital signs in last 24 hours: Temp:  [97.5 F (36.4 C)-98.6 F (37 C)] 97.5 F (36.4 C) (06/20 0546) Pulse Rate:  [31-101] 56 (06/20 0551) Resp:  [12-28] 14 (06/20 0551) BP: (143-192)/(53-127) 143/59 (06/20 0551) SpO2:  [92 %-100 %] 100 % (06/20 0546) Weight:  [74.4 kg] 74.4 kg (06/19 2007)  Intake/Output from previous day: 06/19 0701 - 06/20 0700 In: 720.1 [I.V.:720.1] Out: 200 [Urine:200] Intake/Output this shift: No intake/output data recorded.  Recent Labs    10/08/18 1935  HGB 10.4*   Recent Labs    10/08/18 1935  WBC 6.2  RBC 3.69*  HCT 33.1*  PLT 275   Recent Labs    10/08/18 1935  NA 138  K 4.1  CL 104  CO2 23  BUN 57*  CREATININE 1.64*  GLUCOSE 168*  CALCIUM 8.7*   No results for input(s): LABPT, INR in the last 72 hours.  Neurologically intact ABD soft Neurovascular intact Sensation intact distally Intact pulses distally Dorsiflexion/Plantar flexion intact Left leg + for cellulitis, (present before admission) Compartments soft, - Homan's bilaterally.    Assessment/Plan: 1 Day Post-Op Procedure(s) (LRB): CLOSED MANIPULATION HIP (Left)  Complicated by post-operative bradycardia  DVT Ppx: lovenox Encourage IS Up with therapy  From an orthopedic standpoint the patient is stable for discharge, will await the OK from medicine to discharge.   Yvonne Kendall Ward 10/09/2018, 8:48 AM

## 2018-10-09 NOTE — Op Note (Signed)
OPERATIVE REPORT   10/09/2018  12:11 AM  PATIENT:  [name]   SURGEON:  Bertram Savin, MD  ASSISTANT: Staff.   PREOPERATIVE DIAGNOSIS: Dislocated left total hip arthroplasty  POSTOPERATIVE DIAGNOSIS:  Same.  PROCEDURE: Closed reduction of left hip.  ANESTHESIA:   GETA.  ANTIBIOTICS: None.  IMPLANTS: None.  SPECIMENS: None.  COMPLICATIONS: None.  DISPOSITION: Stable to PACU.  SURGICAL INDICATIONS:  [name] is a 78 y.o. male with a history of neuromuscular disorder who originally fell back in fall 2019, sustaining a displaced left femoral neck fracture.  He then developed instability of his hemiarthroplasty, and he underwent conversion to total hip arthroplasty by Dr. Alvan Dame on 06/22/2018.  Since the surgery, he has had one episode of instability requiring close reduction in the operating room last month.  The patient states that he was reaching while in a seated position for a pair of scissors, and he felt his hip dislocate.  He had left hip pain and inability to weight-bear.  He came to the emergency department, where x-rays revealed dislocated left total hip arthroplasty.  Unsuccessful reduction was attempted by the emergency department physicians.  Orthopedic consultation was placed.  The patient was indicated for closed reduction in the operating room.  The risks, benefits, and alternatives were discussed with the patient preoperatively including but not limited to the risks of infection, bleeding, nerve / blood vessel injury, recurrent instability, cardiopulmonary complications, the need for repeat surgery, among others, and the patient was willing to proceed.  PROCEDURE IN DETAIL: The patient was identified in the holding area using 2 identifiers.  The surgical site was marked by myself.  He was taken to the operating room, and general anesthesia was induced.  Timeout was called, verifying site and site of surgery.  Antibiotics were not given as none were  indicated.  I attempted to reduce the hip with traction and rotation.  The reduction was extremely difficult due to his underlying flexor/abductor contracture of the hip.  It took several attempts to achieve a successful reduction.  Reduction was confirmed with intraoperative portable x-ray.  Abduction wedge was placed.  The patient was then extubated and taken to the PACU in stable condition.  There were no known complications.  POSTOPERATIVE PLAN: Patient will maintain abduction wedge.  Posterior hip precautions.  Follow-up with Dr. Alvan Dame next week.

## 2018-10-09 NOTE — Progress Notes (Addendum)
ANTICOAGULATION CONSULT NOTE - Initial Consult  Pharmacy Consult for Eliquis Indication: atrial fibrillation  Allergies  Allergen Reactions  . Doxycycline   . Amlodipine Nausea And Vomiting and Other (See Comments)    dizziness  . Fish Allergy Nausea And Vomiting    STATES HE HAS NOT EATEN ANY FISH INCLUDING SHELLFISH FOR PAST 40 YRS - IT CAUSED NAUSEA  . Hydrocodone Itching and Rash  . Other Nausea And Vomiting    STATES HE HAS NOT EATEN ANY FISH INCLUDING SHELLFISH FOR PAST 40 YRS - IT CAUSED NAUSEA  . Tylenol [Acetaminophen] Other (See Comments)    Liver problems    Patient Measurements: Weight: 164 lb (74.4 kg)  Vital Signs: Temp: 98 F (36.7 C) (06/20 1150) Temp Source: Oral (06/20 1150) BP: 143/59 (06/20 0551) Pulse Rate: 56 (06/20 0551)  Labs: Recent Labs    10/08/18 1935  HGB 10.4*  HCT 33.1*  PLT 275  CREATININE 1.64*    Estimated Creatinine Clearance: 41 mL/min (A) (by C-G formula based on SCr of 1.64 mg/dL (H)).   Medical History: Past Medical History:  Diagnosis Date  . AAA (abdominal aortic aneurysm) ([place])   . Blindness of left eye   . 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 CAD (coronary artery disease)    a. s/p CABG in 10/2008 with LIMA-LAD, SVG-OM1 with Y-graft to PL branch, and SVG-RCA  . Carotid stenosis   . Cerebrovascular disease   . CHF (congestive heart failure) ([place])   . Cirrhosis ([place])    on CT a/p 07/2013, no longer drinking  . CONGENITAL UNSPEC REDUCTION DEFORMITY LOWER LIMB   . Constipation due to opioid therapy 2014   began about a year ago  . COPD (chronic obstructive pulmonary disease) ([place])   . HTN (hypertension)   . Hyperlipidemia   . 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 TOBACCO USE, QUIT     Medications:  No anticoagulation PTA  Assessment: Patient with Afib & CHADS2Vasc score=4 only on aspirin PTA.  Pharmacy consulted to start apixaban. Lovenox 30mg  sq  ordered post-op to start in AM.  Scr 1.64, age 78, wt 74.4kg---->patient only meets 1/3 criteria for dose reduction.    Plan:  Eliquis 5mg  po BID - start 6/21 AM (delay start secondary to surgery today) Provide patient education & manufacturer coupon.  Monitor for s/sx of bleeding.   Biagio Borg 10/09/2018,11:53 AM  Patient called after taking prescription & discount card to [facility].  He did not get Eliquis filled because he was not eligible for discount.  States insurance copay for medication was $8.59.   Chart reviewed & he had been on Eiquis in 2016 and therefore not eligible for $0 copay for 30d again.   Patient states he can afford co-pay but Dr Percival Spanish mentioned samples so he would like to call cardiology office on Monday to see if they can provide initial medication as samples to see how he tolerates medication prior to paying for it.  Recommended patient resume taking aspirin tomorrow until he is able to discuss further with Dr Percival Spanish and get Eliquis prescription filled.   Netta Cedars, PharmD, BCPS 10/09/2018@6 :28 PM

## 2018-10-10 NOTE — Discharge Summary (Signed)
Physician Discharge Summary  Patient ID: [name] MRN: [id number] DOB/AGE: [birth year] 76 y.o.  Admit date: 10/08/2018 Discharge date: 10/10/2018  Admission Diagnoses:  Closed dislocation of left hip [facility])  Discharge Diagnoses:  Principal Problem:   Closed dislocation of left hip [facility])   Past Medical History:  Diagnosis Date  . AAA (abdominal aortic aneurysm) ([place])   . Blindness of left eye   . 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 CAD (coronary artery disease)    a. s/p CABG in 10/2008 with LIMA-LAD, SVG-OM1 with Y-graft to PL branch, and SVG-RCA  . Carotid stenosis   . Cerebrovascular disease   . CHF (congestive heart failure) ([place])   . Cirrhosis ([place])    on CT a/p 07/2013, no longer drinking  . CONGENITAL UNSPEC REDUCTION DEFORMITY LOWER LIMB   . Constipation due to opioid therapy 2014   began about a year ago  . COPD (chronic obstructive pulmonary disease) ([place])   . HTN (hypertension)   . Hyperlipidemia   . 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 TOBACCO USE, QUIT     Surgeries: Procedure(s): CLOSED MANIPULATION HIP on 10/08/2018 - 10/09/2018   Consultants (if any):   Discharged Condition: Improved  Hospital Course: [name] is an 78 y.o. male who was admitted 10/08/2018 with a diagnosis of Closed dislocation of left hip ([place]) and went to the operating room on 10/08/2018 - 10/09/2018 and underwent the above named procedures.    He was given perioperative antibiotics:  Anti-infectives (From admission, onward)   Start     Dose/Rate Route Frequency Ordered Stop   10/09/18 2200  cephALEXin (KEFLEX) capsule 500 mg  Status:  Discontinued     500 mg Oral Every 12 hours 10/09/18 0929 10/09/18 1807   10/09/18 0600  ceFAZolin (ANCEF) IVPB 2g/100 mL premix  Status:  Discontinued     2 g 200 mL/hr over 30 Minutes Intravenous Every 6 hours 10/09/18 0332 10/09/18 1807    .  He was given sequential  compression devices, early ambulation, and apixaban for DVT prophylaxis.  Cardiology was consulted for asymptomatic bradycardia.  He benefited maximally from the hospital stay and there were no complications.    Recent vital signs:  Vitals:   10/09/18 1150 10/09/18 1200  BP:  (!) 145/34  Pulse:  (!) 47  Resp:  16  Temp: 98 F (36.7 C)   SpO2:  100%    Recent laboratory studies:  Lab Results  Component Value Date   HGB 10.4 (L) 10/08/2018   HGB 9.7 (L) 09/18/2018   HGB 10.8 (L) 07/02/2018   Lab Results  Component Value Date   WBC 6.2 10/08/2018   PLT 275 10/08/2018   Lab Results  Component Value Date   INR 1.17 02/07/2014   Lab Results  Component Value Date   NA 138 10/08/2018   K 4.1 10/08/2018   CL 104 10/08/2018   CO2 23 10/08/2018   BUN 57 (H) 10/08/2018   CREATININE 1.64 (H) 10/08/2018   GLUCOSE 168 (H) 10/08/2018    Discharge Medications:   Allergies as of 10/09/2018      Reactions   Doxycycline    Amlodipine Nausea And Vomiting, Other (See Comments)   dizziness   Fish Allergy Nausea And Vomiting   STATES HE HAS NOT EATEN ANY FISH INCLUDING SHELLFISH FOR PAST 40 YRS - IT CAUSED NAUSEA   Hydrocodone Itching, Rash   Other Nausea And Vomiting  STATES HE HAS NOT EATEN ANY FISH INCLUDING SHELLFISH FOR PAST 40 YRS - IT CAUSED NAUSEA   Tylenol [acetaminophen] Other (See Comments)   Liver problems      Medication List    STOP taking these medications   aspirin EC 81 MG tablet   diclofenac sodium 1 % Gel Commonly known as: Voltaren   methocarbamol 500 MG tablet Commonly known as: ROBAXIN   nystatin ointment Commonly known as: MYCOSTATIN   polyethylene glycol 17 g packet Commonly known as: MIRALAX / GLYCOLAX     TAKE these medications   albuterol 108 (90 Base) MCG/ACT inhaler Commonly known as: VENTOLIN HFA Inhale 2 puffs into the lungs every 6 (six) hours as needed for wheezing or shortness of breath.   apixaban 5 MG Tabs tablet Commonly  known as: ELIQUIS Take 1 tablet (5 mg total) by mouth 2 (two) times daily.   ferrous sulfate 325 (65 FE) MG tablet Take 1 tablet (325 mg total) by mouth 2 (two) times daily with a meal. Notes to patient: Start tonight   finasteride 5 MG tablet Commonly known as: PROSCAR Take 1 tablet (5 mg total) by mouth daily. Notes to patient: Start 6/21   fluticasone 50 MCG/ACT nasal spray Commonly known as: FLONASE PLACE 1 SPRAY INTO BOTH NOSTRILS EVERY MORNING. What changed:   how much to take  how to take this  when to take this  additional instructions Notes to patient: Start 6/21   furosemide 80 MG tablet Commonly known as: LASIX Take 1 tablet (80 mg total) by mouth daily. Notes to patient: Start 6/21   Guaifenesin 1200 MG Tb12 Take 1 tablet (1,200 mg total) by mouth 2 (two) times daily. Notes to patient: Start tonight   lactulose 10 GM/15ML solution Commonly known as: Rand 30 MLS BY MOUTH 2 TIMES DAILY AS NEEDED FOR MILD CONSTIPATION OR MODERATE CONSTIPATION What changed: See the new instructions.   lidocaine 5 % ointment Commonly known as: XYLOCAINE Apply topically 2 (two) times daily as needed for moderate pain. What changed: Another medication with the same name was removed. Continue taking this medication, and follow the directions you see here.   Muscle Rub 10-15 % Crea Apply 1 application topically 2 (two) times daily as needed for muscle pain.   NARCAN NA Place 1 application into the nose once.   oxycodone 30 MG immediate release tablet Commonly known as: ROXICODONE Take 1 tablet (30 mg total) by mouth 5 (five) times daily as needed for moderate pain or severe pain.   potassium chloride SA 20 MEQ tablet Commonly known as: Klor-Con M20 Take 1 tablet (20 mEq total) by mouth daily. Notes to patient: Start 6/21   pravastatin 20 MG tablet Commonly known as: PRAVACHOL TAKE 1 TABLET BY MOUTH EVERY DAY Notes to patient: Start 6/21   sertraline 25 MG  tablet Commonly known as: ZOLOFT TAKE 1 TABLET BY MOUTH EVERYDAY AT BEDTIME Notes to patient: Start tonight   spironolactone 25 MG tablet Commonly known as: ALDACTONE Take 1 tablet (25 mg total) by mouth daily. Notes to patient: Start 6/21   tamsulosin 0.4 MG Caps capsule Commonly known as: FLOMAX Take 1 capsule (0.4 mg total) by mouth daily. Notes to patient: Start 6/21       Diagnostic Studies: Dg Pelvis 1-2 Views  Result Date: 10/09/2018 CLINICAL DATA:  Status post closed manipulation of left hip in the operating room. EXAM: PELVIS - 1-2 VIEW COMPARISON:  Radiograph earlier this day. FINDINGS: Femoral  head component is been relocated and now is seated in the acetabular component. No visualized fracture. Portions of the iliac wings excluded from the field of view. IMPRESSION: Relocation of prior left hip arthroplasty dislocation. No visualized fracture. Electronically Signed   By: Keith Rake M.D.   On: 10/09/2018 01:01   Dg C-arm 1-60 Min-no Report  Result Date: 09/18/2018 Fluoroscopy was utilized by the requesting physician.  No radiographic interpretation.   Dg Hip Operative Unilat W Or W/o Pelvis Left  Result Date: 09/18/2018 CLINICAL DATA:  Closed hip reduction EXAM: OPERATIVE LEFT HIP (WITH PELVIS IF PERFORMED) 2 VIEWS TECHNIQUE: Fluoroscopic spot image(s) were submitted for interpretation post-operatively. COMPARISON:  09/18/2018 FINDINGS: Interval reduction of the previously seen dislocated left hip replacement. Normal alignment on these intraoperative images. No visible complicating feature. IMPRESSION: Interval reduction of the dislocated hip replacement. Electronically Signed   By: Rolm Baptise M.D.   On: 09/18/2018 17:41   Dg Hip Unilat With Pelvis 2-3 Views Left  Result Date: 10/08/2018 CLINICAL DATA:  Patient reached picked up something and felt a pop in his hip. EXAM: DG HIP (WITH OR WITHOUT PELVIS) 2-3V LEFT COMPARISON:  None. FINDINGS: There is a dislocation of  the patient's LEFT total hip arthroplasty. The ball is displaced superiorly on the cup. IMPRESSION: Dislocated LEFT total hip arthroplasty.  No visible fracture. Recommend repeat imaging post reduction Electronically Signed   By: Staci Righter M.D.   On: 10/08/2018 19:55   Dg Hip Unilat W Or Wo Pelvis 2-3 Views Left  Result Date: 09/18/2018 CLINICAL DATA:  Left hip pain, suspect dislocation EXAM: DG HIP (WITH OR WITHOUT PELVIS) 2-3V LEFT COMPARISON:  06/22/2018 left hip radiographs FINDINGS: Images obscured by patient's pants zipper and metallic keys overlying the left thigh. Superolateral left hip dislocation. Left total hip arthroplasty. No hardware fracture. No acute osseous fracture. No pelvic diastasis. IMPRESSION: Superolateral left hip dislocation. Left total hip arthroplasty. No acute osseous or hardware fracture. Electronically Signed   By: Ilona Sorrel M.D.   On: 09/18/2018 14:37    Disposition: Discharge disposition: 01-Home or Self Care         Follow-up Information    [name], MD. Schedule an appointment as soon as possible for a visit on 10/13/2018.   Specialty: Orthopedic Surgery Contact information: [address] [place] [address] [phone]            Signed: Hilton Cork [facility] 10/10/2018, 7:56 AM

## 2018-10-11 ENCOUNTER — Telehealth: Payer: Self-pay | Admitting: *Deleted

## 2018-10-11 ENCOUNTER — Encounter (HOSPITAL_COMMUNITY): Payer: Self-pay | Admitting: Orthopedic Surgery

## 2018-10-11 DIAGNOSIS — G894 Chronic pain syndrome: Secondary | ICD-10-CM | POA: Diagnosis not present

## 2018-10-11 DIAGNOSIS — M25551 Pain in right hip: Secondary | ICD-10-CM | POA: Diagnosis not present

## 2018-10-11 DIAGNOSIS — M5137 Other intervertebral disc degeneration, lumbosacral region: Secondary | ICD-10-CM | POA: Diagnosis not present

## 2018-10-11 DIAGNOSIS — M545 Low back pain: Secondary | ICD-10-CM | POA: Diagnosis not present

## 2018-10-11 DIAGNOSIS — I739 Peripheral vascular disease, unspecified: Secondary | ICD-10-CM | POA: Diagnosis not present

## 2018-10-11 NOTE — Telephone Encounter (Signed)
Pt was on TCM report admitted 10/08/18 for a Closed dislocation of left hip. Pt underwent a CLOSED MANIPULATION HIP which her tolerated the procedure well. Pt D/C 10/10/18, and will follow=up w/Dr. Alvan Dame 10/13/18.Marland KitchenJohny Chess

## 2018-10-12 NOTE — Progress Notes (Signed)
Virtual Visit via Video Note   This visit type was conducted due to national recommendations for restrictions regarding the COVID-19 Pandemic (e.g. social distancing) in an effort to limit this patient's exposure and mitigate transmission in our community.  Due to his co-morbid illnesses, this patient is at least at moderate risk for complications without adequate follow up.  This format is felt to be most appropriate for this patient at this time.  All issues noted in this document were discussed and addressed.  A limited physical exam was performed with this format.  Please refer to the patient's chart for his consent to telehealth for [facility].   Date:  10/13/2018   ID:  [name], DOB Mar 07, 1941, MRN [id number]  Patient Location:Home Provider Location: Home  PCP:  [name], MD  Cardiologist:  Dr Stanford Breed  Evaluation Performed:  Follow-Up Visit  Chief Complaint:  FU CAD  History of Present Illness:    FU CAD; s/p NSTEMI followed by CABG 10/2008 (L-LAD, S-OM1/L PL br, S-RCA), AAA, carotid stenosis. Beta blocker previously held due to Mobitz Type 1. Patient had atrial flutter ablation in September 2016. Dr. Caryl Comes ordered a Holter monitor that showed significant pauses and heart block but patient was asleep and conservative management felt indicated.  Patient recently had hip dislocation.  Prior to surgery he was found to have slow atrial fibrillation.  He was ultimately placed on anticoagulation. Since last seen,patient denies dyspnea, chest pain, palpitations or syncope.  No bleeding.  Studies: - LHC (09/2008): 3 v CAD, EF 65% =>CABG  - Echo (3/20): Normal LV function, mild left ventricular hypertrophy, mild right ventricular enlargement, severe biatrial enlargement, mild to moderate mitral regurgitation. - Carotid US (04/26/24): RICA 94-85%; LICA 4-62% - f/u 2 years  - Abdominal Ultrasound8/22/19 showed dilatation of 3cm. Follow-up recommended 1  year.  The patient does not have symptoms concerning for COVID-19 infection (fever, chills, cough, or new shortness of breath).    Past Medical History:  Diagnosis Date   AAA (abdominal aortic aneurysm) ([place])    Blindness of left eye    BPH (benign prostatic hypertrophy) with urinary obstruction 07/2013   CAD (coronary artery disease)    a. s/p CABG in 10/2008 with LIMA-LAD, SVG-OM1 with Y-graft to PL branch, and SVG-RCA   Carotid stenosis    Cerebrovascular disease    CHF (congestive heart failure) ([place])    Cirrhosis ([place])    on CT a/p 07/2013, no longer drinking   CONGENITAL UNSPEC REDUCTION DEFORMITY LOWER LIMB    Constipation due to opioid therapy 2014   began about a year ago   COPD (chronic obstructive pulmonary disease) ([place])    HTN (hypertension)    Hyperlipidemia    Mobitz type 1 second degree atrioventricular block 09/06/2013   TOBACCO USE, QUIT    Past Surgical History:  Procedure Laterality Date   CORONARY ARTERY BYPASS GRAFT  11/03/2008   Ricard Dillon - x4: left internal mammary artery to the distal left anterior descending, saphemous vein graft to the first circumflex marginal branch with a Y graft sequentiallly to a left posterolateral branch, saphenous vein graft to the distal right coronary artery   ELECTROPHYSIOLOGIC STUDY N/A 01/18/2015   Procedure: A-Flutter Ablation;  Surgeon: Deboraha Sprang, MD;  Location: [place] [facility];  Service: Cardiovascular;  Laterality: N/A;   GREEN LIGHT LASER TURP (TRANSURETHRAL RESECTION OF PROSTATE N/A 02/14/2014   Procedure: GREEN LIGHT LASER TURP (TRANSURETHRAL RESECTION OF PROSTATE;  Surgeon: Festus Aloe,  MD;  Location: [facility];  Service: Urology;  Laterality: N/A;   HIP ARTHROPLASTY Left 01/26/2018   Procedure: LEFT HIP POSTERIOR HEMIARTHROPLASTY;  Surgeon: [name], MD;  Location: [facility];  Service: Orthopedics;  Laterality: Left;   HIP CLOSED REDUCTION Left 09/18/2018   Procedure: CLOSED MANIPULATION HIP;   Surgeon: Justice Britain, MD;  Location: [facility];  Service: Orthopedics;  Laterality: Left;   HIP CLOSED REDUCTION Left 10/08/2018   Procedure: CLOSED MANIPULATION HIP;  Surgeon: Rod Can, MD;  Location: [facility];  Service: Orthopedics;  Laterality: Left;   TOTAL HIP REVISION Left 06/24/2018   Procedure: ACETABULAR HIP REVISION POSTERIOR;  Surgeon: [name], MD;  Location: [facility];  Service: Orthopedics;  Laterality: Left;     Current Meds  Medication Sig   albuterol (VENTOLIN HFA) 108 (90 Base) MCG/ACT inhaler Inhale 2 puffs into the lungs every 6 (six) hours as needed for wheezing or shortness of breath.   apixaban (ELIQUIS) 5 MG TABS tablet Take 1 tablet (5 mg total) by mouth 2 (two) times daily.   finasteride (PROSCAR) 5 MG tablet Take 1 tablet (5 mg total) by mouth daily.   fluticasone (FLONASE) 50 MCG/ACT nasal spray PLACE 1 SPRAY INTO BOTH NOSTRILS EVERY MORNING. (Patient taking differently: Place 1 spray into both nostrils daily. )   furosemide (LASIX) 80 MG tablet Take 1 tablet (80 mg total) by mouth daily.   Guaifenesin 1200 MG TB12 Take 1 tablet (1,200 mg total) by mouth 2 (two) times daily.   lactulose (CHRONULAC) 10 GM/15ML solution TAKE 30 MLS BY MOUTH 2 TIMES DAILY AS NEEDED FOR MILD CONSTIPATION OR MODERATE CONSTIPATION (Patient taking differently: Take 20 g by mouth 2 (two) times daily as needed for mild constipation. )   lidocaine (XYLOCAINE) 5 % ointment Apply topically 2 (two) times daily as needed for moderate pain.   Naloxone HCl (NARCAN NA) Place 1 application into the nose once.   oxyCODONE (ROXICODONE) 30 MG immediate release tablet Take 1 tablet (30 mg total) by mouth 5 (five) times daily as needed for moderate pain or severe pain.   potassium chloride SA (KLOR-CON M20) 20 MEQ tablet Take 1 tablet (20 mEq total) by mouth daily.   pravastatin (PRAVACHOL) 20 MG tablet TAKE 1 TABLET BY MOUTH EVERY DAY (Patient taking differently: Take 20 mg by mouth daily.  )   sertraline (ZOLOFT) 25 MG tablet TAKE 1 TABLET BY MOUTH EVERYDAY AT BEDTIME   spironolactone (ALDACTONE) 25 MG tablet Take 1 tablet (25 mg total) by mouth daily.   tamsulosin (FLOMAX) 0.4 MG CAPS capsule Take 1 capsule (0.4 mg total) by mouth daily.   [DISCONTINUED] Menthol-Methyl Salicylate (MUSCLE RUB) 10-15 % CREA Apply 1 application topically 2 (two) times daily as needed for muscle pain.     Allergies:   Doxycycline, Amlodipine, Fish allergy, Hydrocodone, Other, and Tylenol [acetaminophen]   Social History   Tobacco Use   Smoking status: Former Smoker   Smokeless tobacco: Never Used  Substance Use Topics   Alcohol use: No    Alcohol/week: 0.0 standard drinks    Comment: History of heavy alcohol use per pt. Quit many years ago1/1/ 2005   Drug use: No     Family Hx: The patient's family history includes Cancer in his brother; Cirrhosis in his mother; Heart attack in his father; Other in his brother and brother. There is no history of Colon cancer or Stomach cancer.  ROS:   Please see the history of present illness.  No Fever, chills  or productive cough; some residual hip pain. All other systems reviewed and are negative.   Recent Labs: 06/28/2018: Magnesium 2.3 07/02/2018: ALT 21 10/08/2018: BUN 57; Creatinine, Ser 1.64; Hemoglobin 10.4; Platelets 275; Potassium 4.1; Sodium 138   Recent Lipid Panel Lab Results  Component Value Date/Time   CHOL 125 03/16/2017 12:16 PM   TRIG 96 03/16/2017 12:16 PM   HDL 42 03/16/2017 12:16 PM   CHOLHDL 3.0 03/16/2017 12:16 PM   CHOLHDL 3 12/13/2015 01:58 PM   LDLCALC 64 03/16/2017 12:16 PM    Wt Readings from Last 3 Encounters:  10/13/18 172 lb (78 kg)  10/08/18 164 lb (74.4 kg)  08/02/18 155 lb (70.3 kg)     Objective:    Vital Signs:  BP (!) 154/55    Pulse (!) 57    Ht 5\' 10"  (1.778 m)    Wt 172 lb (78 kg)    BMI 24.68 kg/m    VITAL SIGNS:  reviewed NAD Answers questions appropriately Normal  affect Remainder of physical examination not performed (telehealth visit; coronavirus pandemic)  ASSESSMENT & PLAN:    1. Coronary artery disease-patient denies chest pain.  Plan to continue medical therapy with statin.  No aspirin given need for apixaban. 2. Aortic stenosis-not evident on most recent echocardiogram.  Will likely repeat echoes in the future. 3. Abdominal aortic aneurysm-plan follow-up ultrasound August 2020. 4. Carotid artery disease-plan follow-up carotid Dopplers August 2021. 5. Hypertension-patient's blood pressure is controlled.  Continue present medications and follow. 6. Hyperlipidemia-we will continue with statin. 7. History of Mobitz 1 second-degree AV block- avoid AV nodal blocking agents.  No syncope. 8. Prior atrial flutter ablation 9. Chronic diastolic congestive heart failure-patient is euvolemic by history.  Continue present dose of Lasix. 10. Atrial fibrillation-patient has newly diagnosed atrial fibrillation.  However he is asymptomatic.  His rate is controlled on no medications.  Continue apixaban.  Plan will be rate control and anticoagulation.  COVID-19 Education: The importance of social distancing was discussed today.  Time:   Today, I have spent 18 minutes with the patient with telehealth technology discussing the above problems.     Medication Adjustments/Labs and Tests Ordered: Current medicines are reviewed at length with the patient today.  Concerns regarding medicines are outlined above.   Tests Ordered: No orders of the defined types were placed in this encounter.   Medication Changes: No orders of the defined types were placed in this encounter.   Follow Up:  Virtual Visit or In Person in 6 month(s)  Signed, Kirk Ruths, MD  10/13/2018 10:04 AM    [place]

## 2018-10-13 ENCOUNTER — Telehealth (INDEPENDENT_AMBULATORY_CARE_PROVIDER_SITE_OTHER): Payer: Medicare Other | Admitting: Cardiology

## 2018-10-13 VITALS — BP 154/55 | HR 57 | Ht 70.0 in | Wt 172.0 lb

## 2018-10-13 DIAGNOSIS — I4819 Other persistent atrial fibrillation: Secondary | ICD-10-CM

## 2018-10-13 DIAGNOSIS — I10 Essential (primary) hypertension: Secondary | ICD-10-CM

## 2018-10-13 DIAGNOSIS — I714 Abdominal aortic aneurysm, without rupture, unspecified: Secondary | ICD-10-CM

## 2018-10-13 DIAGNOSIS — I2510 Atherosclerotic heart disease of native coronary artery without angina pectoris: Secondary | ICD-10-CM

## 2018-10-13 NOTE — Patient Instructions (Signed)
Medication Instructions:  NO CHANGE If you need a refill on your cardiac medications before your next appointment, please call your pharmacy.   Lab work: If you have labs (blood work) drawn today and your tests are completely normal, you will receive your results only by: [name] MyChart Message (if you have MyChart) OR . A paper copy in the mail If you have any lab test that is abnormal or we need to change your treatment, we will call you to review the results.  Testing/Procedures: Your physician has requested that you have an abdominal aorta duplex. During this test, an ultrasound is used to evaluate the aorta. Allow 30 minutes for this exam. Do not eat after midnight the day before and avoid carbonated beverages SCHEDULE IN SEPTEMBER  Follow-Up: At [facility], you and your health needs are our priority.  As part of our continuing mission to provide you with exceptional heart care, we have created designated Provider Care Teams.  These Care Teams include your primary Cardiologist (physician) and Advanced Practice Providers (APPs -  Physician Assistants and Nurse Practitioners) who all work together to provide you with the care you need, when you need it. You will need a follow up appointment in 6 months.  Please call our office 2 months in advance to schedule this appointment.  You may see Kirk Ruths, MD or one of the following Advanced Practice Providers on your designated Care Team:   Kerin Ransom, PA-C Roby Lofts, [place] . Sande Rives, PA-C

## 2018-10-19 ENCOUNTER — Encounter (HOSPITAL_COMMUNITY): Payer: Self-pay

## 2018-10-19 ENCOUNTER — Inpatient Hospital Stay: Admit: 2018-10-19 | Payer: Medicare Other | Admitting: Orthopedic Surgery

## 2018-10-19 ENCOUNTER — Inpatient Hospital Stay (HOSPITAL_COMMUNITY)
Admission: EM | Admit: 2018-10-19 | Discharge: 2018-10-22 | DRG: 467 | Disposition: A | Discharge status: Home / Self Care | Payer: Medicare Other | Attending: Orthopedic Surgery | Admitting: Orthopedic Surgery

## 2018-10-19 ENCOUNTER — Emergency Department (HOSPITAL_COMMUNITY): Payer: Medicare Other

## 2018-10-19 DIAGNOSIS — M7989 Other specified soft tissue disorders: Secondary | ICD-10-CM | POA: Diagnosis not present

## 2018-10-19 DIAGNOSIS — T84021A Dislocation of internal left hip prosthesis, initial encounter: Secondary | ICD-10-CM | POA: Diagnosis not present

## 2018-10-19 DIAGNOSIS — I110 Hypertensive heart disease with heart failure: Secondary | ICD-10-CM | POA: Diagnosis present

## 2018-10-19 DIAGNOSIS — I130 Hypertensive heart and chronic kidney disease with heart failure and stage 1 through stage 4 chronic kidney disease, or unspecified chronic kidney disease: Secondary | ICD-10-CM | POA: Diagnosis present

## 2018-10-19 DIAGNOSIS — I6529 Occlusion and stenosis of unspecified carotid artery: Secondary | ICD-10-CM | POA: Diagnosis present

## 2018-10-19 DIAGNOSIS — Z1159 Encounter for screening for other viral diseases: Secondary | ICD-10-CM | POA: Diagnosis not present

## 2018-10-19 DIAGNOSIS — E785 Hyperlipidemia, unspecified: Secondary | ICD-10-CM | POA: Diagnosis not present

## 2018-10-19 DIAGNOSIS — T148XXA Other injury of unspecified body region, initial encounter: Secondary | ICD-10-CM

## 2018-10-19 DIAGNOSIS — L0390 Cellulitis, unspecified: Secondary | ICD-10-CM | POA: Diagnosis not present

## 2018-10-19 DIAGNOSIS — S73005A Unspecified dislocation of left hip, initial encounter: Secondary | ICD-10-CM

## 2018-10-19 DIAGNOSIS — I4891 Unspecified atrial fibrillation: Secondary | ICD-10-CM | POA: Diagnosis not present

## 2018-10-19 DIAGNOSIS — Z7901 Long term (current) use of anticoagulants: Secondary | ICD-10-CM

## 2018-10-19 DIAGNOSIS — I441 Atrioventricular block, second degree: Secondary | ICD-10-CM | POA: Diagnosis present

## 2018-10-19 DIAGNOSIS — Z888 Allergy status to other drugs, medicaments and biological substances status: Secondary | ICD-10-CM | POA: Diagnosis not present

## 2018-10-19 DIAGNOSIS — D62 Acute posthemorrhagic anemia: Secondary | ICD-10-CM | POA: Diagnosis not present

## 2018-10-19 DIAGNOSIS — Z87891 Personal history of nicotine dependence: Secondary | ICD-10-CM | POA: Diagnosis not present

## 2018-10-19 DIAGNOSIS — I2510 Atherosclerotic heart disease of native coronary artery without angina pectoris: Secondary | ICD-10-CM | POA: Diagnosis not present

## 2018-10-19 DIAGNOSIS — Z79899 Other long term (current) drug therapy: Secondary | ICD-10-CM

## 2018-10-19 DIAGNOSIS — I5032 Chronic diastolic (congestive) heart failure: Secondary | ICD-10-CM | POA: Diagnosis not present

## 2018-10-19 DIAGNOSIS — M25752 Osteophyte, left hip: Secondary | ICD-10-CM | POA: Diagnosis present

## 2018-10-19 DIAGNOSIS — H5462 Unqualified visual loss, left eye, normal vision right eye: Secondary | ICD-10-CM | POA: Diagnosis present

## 2018-10-19 DIAGNOSIS — Z96649 Presence of unspecified artificial hip joint: Secondary | ICD-10-CM

## 2018-10-19 DIAGNOSIS — M25352 Other instability, left hip: Secondary | ICD-10-CM | POA: Diagnosis present

## 2018-10-19 DIAGNOSIS — Y792 Prosthetic and other implants, materials and accessory orthopedic devices associated with adverse incidents: Secondary | ICD-10-CM | POA: Diagnosis present

## 2018-10-19 DIAGNOSIS — Z8249 Family history of ischemic heart disease and other diseases of the circulatory system: Secondary | ICD-10-CM | POA: Diagnosis not present

## 2018-10-19 DIAGNOSIS — Z885 Allergy status to narcotic agent status: Secondary | ICD-10-CM | POA: Diagnosis not present

## 2018-10-19 DIAGNOSIS — Z03818 Encounter for observation for suspected exposure to other biological agents ruled out: Secondary | ICD-10-CM | POA: Diagnosis not present

## 2018-10-19 DIAGNOSIS — G8929 Other chronic pain: Secondary | ICD-10-CM | POA: Diagnosis present

## 2018-10-19 DIAGNOSIS — L03116 Cellulitis of left lower limb: Secondary | ICD-10-CM | POA: Diagnosis not present

## 2018-10-19 DIAGNOSIS — Z951 Presence of aortocoronary bypass graft: Secondary | ICD-10-CM

## 2018-10-19 DIAGNOSIS — Z79891 Long term (current) use of opiate analgesic: Secondary | ICD-10-CM | POA: Diagnosis not present

## 2018-10-19 DIAGNOSIS — I714 Abdominal aortic aneurysm, without rupture: Secondary | ICD-10-CM | POA: Diagnosis present

## 2018-10-19 DIAGNOSIS — K7460 Unspecified cirrhosis of liver: Secondary | ICD-10-CM | POA: Diagnosis present

## 2018-10-19 DIAGNOSIS — R52 Pain, unspecified: Secondary | ICD-10-CM | POA: Diagnosis not present

## 2018-10-19 DIAGNOSIS — J449 Chronic obstructive pulmonary disease, unspecified: Secondary | ICD-10-CM | POA: Diagnosis not present

## 2018-10-19 DIAGNOSIS — G809 Cerebral palsy, unspecified: Secondary | ICD-10-CM | POA: Diagnosis present

## 2018-10-19 DIAGNOSIS — Z91013 Allergy to seafood: Secondary | ICD-10-CM

## 2018-10-19 DIAGNOSIS — N183 Chronic kidney disease, stage 3 (moderate): Secondary | ICD-10-CM | POA: Diagnosis not present

## 2018-10-19 DIAGNOSIS — Z881 Allergy status to other antibiotic agents status: Secondary | ICD-10-CM

## 2018-10-19 DIAGNOSIS — I10 Essential (primary) hypertension: Secondary | ICD-10-CM | POA: Diagnosis not present

## 2018-10-19 DIAGNOSIS — Z96642 Presence of left artificial hip joint: Secondary | ICD-10-CM | POA: Diagnosis not present

## 2018-10-19 DIAGNOSIS — T84091D Other mechanical complication of internal left hip prosthesis, subsequent encounter: Secondary | ICD-10-CM | POA: Diagnosis not present

## 2018-10-19 LAB — SARS CORONAVIRUS 2 BY RT PCR (HOSPITAL ORDER, PERFORMED IN ~~LOC~~ HOSPITAL LAB): SARS Coronavirus 2: NEGATIVE

## 2018-10-19 SURGERY — CLOSED REDUCTION, HIP
Anesthesia: Choice | Laterality: Left

## 2018-10-19 MED ORDER — FENTANYL CITRATE (PF) 100 MCG/2ML IJ SOLN
50.0000 ug | Freq: Once | INTRAMUSCULAR | Status: AC
  Administered 2018-10-19: 50 ug via INTRAVENOUS
  Filled 2018-10-19: qty 2

## 2018-10-19 MED ORDER — PROPOFOL 10 MG/ML IV BOLUS
60.0000 mg | Freq: Once | INTRAVENOUS | Status: AC
  Administered 2018-10-19: 60 mg via INTRAVENOUS
  Filled 2018-10-19: qty 20

## 2018-10-19 MED ORDER — PROPOFOL 10 MG/ML IV BOLUS
INTRAVENOUS | Status: AC | PRN
  Administered 2018-10-19: 60 mg via INTRAVENOUS
  Administered 2018-10-19 (×2): 30 mg via INTRAVENOUS

## 2018-10-19 MED ORDER — FENTANYL CITRATE (PF) 100 MCG/2ML IJ SOLN
50.0000 ug | INTRAMUSCULAR | Status: DC | PRN
  Administered 2018-10-19: 50 ug via INTRAVENOUS
  Filled 2018-10-19: qty 2

## 2018-10-19 NOTE — ED Triage Notes (Signed)
Pt arrived EMS for left hip dislocation. Pt reports he was sitting down and felt his hip pop out of of place.

## 2018-10-19 NOTE — Sedation Documentation (Signed)
Dr. Vanita Panda and Dr. Rex Kras are going to consult with ortho due to unsuccessful reduction.

## 2018-10-19 NOTE — ED Notes (Signed)
Patient signed, Dr. Kirby Funk, and myself (as witnessed) signed an Informed Consent for Left Hip Closed Reduction.

## 2018-10-19 NOTE — ED Provider Notes (Signed)
.  Sedation  Date/Time: 10/19/2018 8:42 PM Performed by: Sharlett Iles, MD Authorized by: Sharlett Iles, MD   Consent:    Consent obtained:  Written   Consent given by:  Patient   Risks discussed:  Inadequate sedation, respiratory compromise necessitating ventilatory assistance and intubation and prolonged hypoxia resulting in organ damage   Alternatives discussed:  Analgesia without sedation Universal protocol:    Immediately prior to procedure a time out was called: yes   Indications:    Procedure performed:  Dislocation reduction   Procedure necessitating sedation performed by:  Different physician Pre-sedation assessment:    Time since last food or drink:  4 hours   NPO status caution: urgency dictates proceeding with non-ideal NPO status     ASA classification: class 3 - patient with severe systemic disease     Neck mobility: normal     Mouth opening:  3 or more finger widths   Thyromental distance:  4 finger widths   Mallampati score:  II - soft palate, uvula, fauces visible   Pre-sedation assessments completed and reviewed: airway patency, cardiovascular function, mental status, pain level and respiratory function   Immediate pre-procedure details:    Reviewed: vital signs, relevant labs/tests and NPO status     Verified: bag valve mask available, emergency equipment available, intubation equipment available, IV patency confirmed, oxygen available and suction available   Procedure details (see MAR for exact dosages):    Preoxygenation:  Nasal cannula   Sedation:  Propofol   Intra-procedure monitoring:  Blood pressure monitoring, cardiac monitor, continuous pulse oximetry, continuous capnometry, frequent LOC assessments and frequent vital sign checks   Intra-procedure events: none     Total Provider sedation time (minutes):  15 Post-procedure details:    Attendance: Constant attendance by certified staff until patient recovered     Recovery: Patient returned to  pre-procedure baseline     Post-sedation assessments completed and reviewed: airway patency, cardiovascular function, mental status, pain level and respiratory function     Patient is stable for discharge or admission: yes     Patient tolerance:  Tolerated well, no immediate complications      Guy Seese, Wenda Overland, MD 10/19/18 2044

## 2018-10-19 NOTE — Sedation Documentation (Signed)
Rates pain 10/10.

## 2018-10-19 NOTE — ED Notes (Signed)
ED TO INPATIENT HANDOFF REPORT  ED Nurse Name and Phone #:  Duard Larsen RN [phone]  S Name/Age/Gender [name] 78 y.o. male Room/Bed: WA06/WA06  Code Status   Code Status: Prior  Home/SNF/Other Home Patient oriented to: self, place, time and situation Is this baseline? Yes   Triage Complete: Triage complete  Chief Complaint hip dislocation  Triage Note Pt arrived EMS for left hip dislocation. Pt reports he was sitting down and felt his hip pop out of of place.   Allergies Allergies  Allergen Reactions  . Doxycycline   . Amlodipine Nausea And Vomiting and Other (See Comments)    dizziness  . Fish Allergy Nausea And Vomiting    STATES HE HAS NOT EATEN ANY FISH INCLUDING SHELLFISH FOR PAST 40 YRS - IT CAUSED NAUSEA  . Hydrocodone Itching and Rash  . Other Nausea And Vomiting    STATES HE HAS NOT EATEN ANY FISH INCLUDING SHELLFISH FOR PAST 40 YRS - IT CAUSED NAUSEA  . Tylenol [Acetaminophen] Other (See Comments)    Liver problems    Level of Care/Admitting Diagnosis ED Disposition    ED Disposition Condition Comment   Admit  Hospital Area: [facility] [[id number]]  Level of Care: Med-Surg [16]  Covid Evaluation: Confirmed COVID Negative  Diagnosis: Hip instability, left [[id number]]  Admitting Physician: Nicholes Stairs [[id number]]  Attending Physician: Nicholes Stairs [[id number]]  Estimated length of stay: 3 - 4 days  Certification:: I certify this patient will need inpatient services for at least 2 midnights  PT Class (Do Not Modify): Inpatient [101]  PT Acc Code (Do Not Modify): Private [1]       B Medical/Surgery History Past Medical History:  Diagnosis Date  . AAA (abdominal aortic aneurysm) ([place])   . Blindness of left eye   . 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 CAD (coronary artery disease)    a. s/p CABG in 10/2008 with LIMA-LAD, SVG-OM1 with Y-graft to PL branch, and SVG-RCA  .  Carotid stenosis   . Cerebrovascular disease   . CHF (congestive heart failure) ([place])   . Cirrhosis ([place])    on CT a/p 07/2013, no longer drinking  . CONGENITAL UNSPEC REDUCTION DEFORMITY LOWER LIMB   . Constipation due to opioid therapy 2014   began about a year ago  . COPD (chronic obstructive pulmonary disease) ([place])   . HTN (hypertension)   . Hyperlipidemia   . 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 TOBACCO USE, QUIT    Past Surgical History:  Procedure Laterality Date  . CORONARY ARTERY BYPASS GRAFT  11/03/2008   Ricard Dillon - x4: left internal mammary artery to the distal left anterior descending, saphemous vein graft to the first circumflex marginal branch with a Y graft sequentiallly to a left posterolateral branch, saphenous vein graft to the distal right coronary artery  . ELECTROPHYSIOLOGIC STUDY N/A 01/18/2015   Procedure: A-Flutter Ablation;  Surgeon: Deboraha Sprang, MD;  Location: [facility];  Service: Cardiovascular;  Laterality: N/A;  . GREEN LIGHT LASER TURP (TRANSURETHRAL RESECTION OF PROSTATE N/A 02/14/2014   Procedure: GREEN LIGHT LASER TURP (TRANSURETHRAL RESECTION OF PROSTATE;  Surgeon: Festus Aloe, MD;  Location: [facility];  Service: Urology;  Laterality: N/A;  . HIP ARTHROPLASTY Left 01/26/2018   Procedure: LEFT HIP POSTERIOR HEMIARTHROPLASTY;  Surgeon: [name], MD;  Location: [facility];  Service: Orthopedics;  Laterality: Left;  . HIP CLOSED REDUCTION Left 09/18/2018  Procedure: CLOSED MANIPULATION HIP;  Surgeon: Justice Britain, MD;  Location: [facility];  Service: Orthopedics;  Laterality: Left;  . HIP CLOSED REDUCTION Left 10/08/2018   Procedure: CLOSED MANIPULATION HIP;  Surgeon: Rod Can, MD;  [facility];  Service: Orthopedics;  Laterality: Left;  . TOTAL HIP REVISION Left 06/24/2018   Procedure: ACETABULAR HIP REVISION POSTERIOR;  Surgeon: [name], MD;  [facility];  Service: Orthopedics;  Laterality: Left;      A IV Location/Drains/Wounds Patient Lines/Drains/Airways Status   Active Line/Drains/Airways    Name:   Placement date:   Placement time:   Site:   Days:   Peripheral IV 10/19/18 Left Forearm   10/19/18    1757    Forearm   less than 1   Incision (Closed) 01/26/18 Hip Left   01/26/18    1245     266   Incision (Closed) 06/24/18 Hip Left   06/24/18    1408     117   Incision (Closed) 10/08/18 Hip Left   10/08/18    2333     11          Intake/Output Last 24 hours No intake or output data in the 24 hours ending 10/19/18 2327  Labs/Imaging Results for orders placed or performed during the hospital encounter of 10/19/18 (from the past 48 hour(s))  SARS Coronavirus 2 (CEPHEID - Performed in [place] hospital lab), Hosp Order     Status: None   Collection Time: 10/19/18  8:27 PM   Specimen: Nasopharyngeal Swab  Result Value Ref Range   SARS Coronavirus 2 NEGATIVE NEGATIVE    Comment: (NOTE) If result is NEGATIVE SARS-CoV-2 target nucleic acids are NOT DETECTED. The SARS-CoV-2 RNA is generally detectable in upper and lower  respiratory specimens during the acute phase of infection. The lowest  concentration of SARS-CoV-2 viral copies this assay can detect is 250  copies / mL. A negative result does not preclude SARS-CoV-2 infection  and should not be used as the sole basis for treatment or other  patient management decisions.  A negative result may occur with  improper specimen collection / handling, submission of specimen other  than nasopharyngeal swab, presence of viral mutation(s) within the  areas targeted by this assay, and inadequate number of viral copies  (<250 copies / mL). A negative result must be combined with clinical  observations, patient history, and epidemiological information. If result is POSITIVE SARS-CoV-2 target nucleic acids are DETECTED. The SARS-CoV-2 RNA is generally detectable in upper and lower  respiratory specimens dur ing the acute phase of  infection.  Positive  results are indicative of active infection with SARS-CoV-2.  Clinical  correlation with patient history and other diagnostic information is  necessary to determine patient infection status.  Positive results do  not rule out bacterial infection or co-infection with other viruses. If result is PRESUMPTIVE POSTIVE SARS-CoV-2 nucleic acids MAY BE PRESENT.   A presumptive positive result was obtained on the submitted specimen  and confirmed on repeat testing.  While 2019 novel coronavirus  (SARS-CoV-2) nucleic acids may be present in the submitted sample  additional confirmatory testing may be necessary for epidemiological  and / or clinical management purposes  to differentiate between  SARS-CoV-2 and other Sarbecovirus currently known to infect humans.  If clinically indicated additional testing with an alternate test  methodology [phone]) is advised. The SARS-CoV-2 RNA is generally  detectable in upper and lower respiratory sp ecimens during the acute  phase of infection. The expected result is Negative. Fact Sheet for Patients:  StrictlyIdeas.no Fact Sheet for Healthcare Providers: BankingDealers.co.za This test is not yet approved or cleared by the [place] FDA and has been authorized for detection and/or diagnosis of SARS-CoV-2 by FDA under an Emergency Use Authorization (EUA).  This EUA will remain in effect (meaning this test can be used) for the duration of the COVID-19 declaration under Section 564(b)(1) of the Act, 21 U.S.C. section 360bbb-3(b)(1), unless the authorization is terminated or revoked sooner. Performed at [facility], [place] [address], [place], [place] [address]    Dg Hip Port Unilat With Pelvis 1v Left  Result Date: 10/19/2018 CLINICAL DATA:  Dislocated hip EXAM: DG HIP (WITH OR WITHOUT PELVIS) 1V PORT LEFT COMPARISON:  10/08/2018 FINDINGS: There is superior dislocation  of the left hip replacement. No visible fracture. 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 Electronically Signed   By: Rolm Baptise M.D.   On: 10/19/2018 19:04    Pending Labs [employer] (From admission, onward)    Start     Ordered   Signed and Held  CBC  (enoxaparin (LOVENOX)    CrCl >/= 30 ml/min)  Once,   R    Comments: Baseline for enoxaparin therapy IF NOT ALREADY DRAWN.  Notify MD if PLT < 100 K.    Signed and Held   Signed and Held  Creatinine, serum  (enoxaparin (LOVENOX)    CrCl >/= 30 ml/min)  Once,   R    Comments: Baseline for enoxaparin therapy IF NOT ALREADY DRAWN.    Signed and Held   Signed and Held  Creatinine, serum  (enoxaparin (LOVENOX)    CrCl >/= 30 ml/min)  Weekly,   R    Comments: while on enoxaparin therapy    Signed and Held          Vitals/Pain Today's Vitals   10/19/18 2210 10/19/18 2217 10/19/18 2218 10/19/18 2321  BP: (!) 180/64 (!) 106/93    Pulse: 63 (!) 52    Resp: 19 18    Temp:      TempSrc:      SpO2: 100% 100%    Weight:      Height:      PainSc:   10-Worst pain ever 10-Worst pain ever    Isolation Precautions No active isolations  Medications Medications  fentaNYL (SUBLIMAZE) injection 50 mcg (50 mcg Intravenous Given 10/19/18 2219)  fentaNYL (SUBLIMAZE) injection 50 mcg (50 mcg Intravenous Given 10/19/18 1756)  propofol (DIPRIVAN) 10 mg/mL bolus/IV push 60 mg (60 mg Intravenous Given 10/19/18 2011)  propofol (DIPRIVAN) 10 mg/mL bolus/IV push (30 mg Intravenous Given 10/19/18 2017)  fentaNYL (SUBLIMAZE) injection 50 mcg (50 mcg Intravenous Given 10/19/18 2051)    Mobility non-ambulatory     Focused Assessments Ortho    R Recommendations: See Admitting Provider Note  Report given to: Vaughan Basta, RN for room 1327.   Additional Notes:

## 2018-10-19 NOTE — Sedation Documentation (Signed)
Patient is alert, oriented x 4, and talking in complete sentences.

## 2018-10-19 NOTE — ED Notes (Signed)
Bed: WA06 Expected date:  Expected time:  Means of arrival:  Comments: EMS-hip dislocation

## 2018-10-19 NOTE — H&P (Signed)
ORTHOPAEDIC H and P  REQUESTING PHYSICIAN: Carmin Muskrat, MD  PCP:  [name], MD  Chief Complaint: Left hip instability  HPI: [name] is a 78 y.o. male who complains of left hip dislocation following a forward flexion moment early today.  He was seen in the ED with attempted CR by EDP.  This was unsuccessful.  He has now three dislocations in 3 months.  He denies numbness or tingling.  He has been wrapping some cellulitis on the lower left leg, and taking oral Abx.  No new medical problems.  Past Medical History:  Diagnosis Date  . AAA (abdominal aortic aneurysm) ([place])   . Blindness of left eye   . 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 CAD (coronary artery disease)    a. s/p CABG in 10/2008 with LIMA-LAD, SVG-OM1 with Y-graft to PL branch, and SVG-RCA  . Carotid stenosis   . Cerebrovascular disease   . CHF (congestive heart failure) ([place])   . Cirrhosis ([place])    on CT a/p 07/2013, no longer drinking  . CONGENITAL UNSPEC REDUCTION DEFORMITY LOWER LIMB   . Constipation due to opioid therapy 2014   began about a year ago  . COPD (chronic obstructive pulmonary disease) ([place])   . HTN (hypertension)   . Hyperlipidemia   . 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 TOBACCO USE, QUIT    Past Surgical History:  Procedure Laterality Date  . CORONARY ARTERY BYPASS GRAFT  11/03/2008   Ricard Dillon - x4: left internal mammary artery to the distal left anterior descending, saphemous vein graft to the first circumflex marginal branch with a Y graft sequentiallly to a left posterolateral branch, saphenous vein graft to the distal right coronary artery  . ELECTROPHYSIOLOGIC STUDY N/A 01/18/2015   Procedure: A-Flutter Ablation;  Surgeon: Deboraha Sprang, MD;  Location: [facility];  Service: Cardiovascular;  Laterality: N/A;  . GREEN LIGHT LASER TURP (TRANSURETHRAL RESECTION OF PROSTATE N/A 02/14/2014   Procedure:  GREEN LIGHT LASER TURP (TRANSURETHRAL RESECTION OF PROSTATE;  Surgeon: Festus Aloe, MD;  [facility];  Service: Urology;  Laterality: N/A;  . HIP ARTHROPLASTY Left 01/26/2018   Procedure: LEFT HIP POSTERIOR HEMIARTHROPLASTY;  Surgeon: [name], MD;  [facility];  Service: Orthopedics;  Laterality: Left;  . HIP CLOSED REDUCTION Left 09/18/2018   Procedure: CLOSED MANIPULATION HIP;  Surgeon: Justice Britain, MD;  [facility];  Service: Orthopedics;  Laterality: Left;  . HIP CLOSED REDUCTION Left 10/08/2018   Procedure: CLOSED MANIPULATION HIP;  Surgeon: Rod Can, MD;  [facility];  Service: Orthopedics;  Laterality: Left;  . TOTAL HIP REVISION Left 06/24/2018   Procedure: ACETABULAR HIP REVISION POSTERIOR;  Surgeon: [name], MD;  [facility];  Service: Orthopedics;  Laterality: Left;   Social History   Socioeconomic History  . Marital status: Widowed    Spouse name: Not on file  . Number of children: Not on file  . Years of education: Not on file  . Highest education level: Not on file  Occupational History  . Not on file  Social Needs  . Financial resource strain: Not on file  . Food insecurity    Worry: Not on file    Inability: Not on file  . Transportation needs    Medical: Not on file    Non-medical: Not on file  Tobacco Use  . Smoking status: Former Research scientist (life sciences)  . Smokeless tobacco: Never  Used  Substance and Sexual Activity  . Alcohol use: No    Alcohol/week: 0.0 standard drinks    Comment: History of heavy alcohol use per pt. Quit many years ago1/1/ 2005  . Drug use: No  . Sexual activity: Not Currently  Lifestyle  . Physical activity    Days per week: Not on file    Minutes per session: Not on file  . Stress: Not on file  Relationships  . Social Herbalist on phone: Not on file    Gets together: Not on file    Attends religious service: Not on file    Active member of club or organization: Not on file    Attends  meetings of clubs or organizations: Not on file    Relationship status: Not on file  Other Topics Concern  . Not on file  Social History Narrative   He lives alone here in [place].  He has 1 son, who lives in the [place],    Family History  Problem Relation Age of Onset  . Cirrhosis Mother        died at 67  . Heart attack Father   . Other Brother        GSW  . Other Brother        died in house fire  . Cancer Brother        unsure of type Believes colon or prostate/fim  . Colon cancer Neg Hx   . Stomach cancer Neg Hx    Allergies  Allergen Reactions  . Doxycycline   . Amlodipine Nausea And Vomiting and Other (See Comments)    dizziness  . Fish Allergy Nausea And Vomiting    STATES HE HAS NOT EATEN ANY FISH INCLUDING SHELLFISH FOR PAST 40 YRS - IT CAUSED NAUSEA  . Hydrocodone Itching and Rash  . Other Nausea And Vomiting    STATES HE HAS NOT EATEN ANY FISH INCLUDING SHELLFISH FOR PAST 40 YRS - IT CAUSED NAUSEA  . Tylenol [Acetaminophen] Other (See Comments)    Liver problems   Prior to Admission medications   Medication Sig Start Date End Date Taking? Authorizing Provider  albuterol (VENTOLIN HFA) 108 (90 Base) MCG/ACT inhaler Inhale 2 puffs into the lungs every 6 (six) hours as needed for wheezing or shortness of breath. 09/06/18   [name], MD  apixaban (ELIQUIS) 5 MG TABS tablet Take 1 tablet (5 mg total) by mouth 2 (two) times daily. 10/10/18   Ward, Yvonne Kendall, PA-C  finasteride (PROSCAR) 5 MG tablet Take 1 tablet (5 mg total) by mouth daily. 10/01/18   [name], MD  fluticasone (FLONASE) 50 MCG/ACT nasal spray PLACE 1 SPRAY INTO BOTH NOSTRILS EVERY MORNING. Patient taking differently: Place 1 spray into both nostrils daily.  09/28/18   [name], MD  furosemide (LASIX) 80 MG tablet Take 1 tablet (80 mg total) by mouth daily. 09/28/18   [name], MD  Guaifenesin 1200 MG TB12 Take 1 tablet (1,200 mg total) by mouth 2  (two) times daily. 02/15/18   [name], MD  lactulose (CHRONULAC) 10 GM/15ML solution TAKE 30 MLS BY MOUTH 2 TIMES DAILY AS NEEDED FOR MILD CONSTIPATION OR MODERATE CONSTIPATION Patient taking differently: Take 20 g by mouth 2 (two) times daily as needed for mild constipation.  06/01/18   [name], MD  lidocaine (XYLOCAINE) 5 % ointment Apply topically 2 (two) times daily as needed for moderate pain.  06/28/18   Kayleen Memos, DO  Naloxone HCl [facility] NA) Place 1 application into the nose once.    [provider]  oxyCODONE (ROXICODONE) 30 MG immediate release tablet Take 1 tablet (30 mg total) by mouth 5 (five) times daily as needed for moderate pain or severe pain. 06/25/18   Danae Orleans, PA-C  potassium chloride SA (KLOR-CON M20) 20 MEQ tablet Take 1 tablet (20 mEq total) by mouth daily. 09/28/18   [name], MD  pravastatin (PRAVACHOL) 20 MG tablet TAKE 1 TABLET BY MOUTH EVERY DAY Patient taking differently: Take 20 mg by mouth daily.  01/12/18   [name], MD  sertraline (ZOLOFT) 25 MG tablet TAKE 1 TABLET BY MOUTH EVERYDAY AT BEDTIME 06/22/18   [name], MD  spironolactone (ALDACTONE) 25 MG tablet Take 1 tablet (25 mg total) by mouth daily. 09/28/18   [name], MD  tamsulosin (FLOMAX) 0.4 MG CAPS capsule Take 1 capsule (0.4 mg total) by mouth daily. 09/28/18   [name], MD   Dg Hip Port Unilat With Pelvis 1v Left  Result Date: 10/19/2018 CLINICAL DATA:  Dislocated hip EXAM: DG HIP (WITH OR WITHOUT PELVIS) 1V PORT LEFT COMPARISON:  10/08/2018 FINDINGS: There is superior dislocation of the left hip replacement. No visible fracture. 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 Electronically Signed   By: Rolm Baptise M.D.   On: 10/19/2018 19:04    Positive ROS: All other systems have been reviewed and were otherwise negative with the exception of those mentioned in the HPI and as  above.  Physical Exam: General: Alert, no acute distress Cardiovascular: No pedal edema Respiratory: No cyanosis, no use of accessory musculature GI: No organomegaly, abdomen is soft and non-tender Skin: No lesions in the area of chief complaint Neurologic: Sensation intact distally Psychiatric: Patient is competent for consent with normal mood and affect Lymphatic: No axillary or cervical lymphadenopathy  MUSCULOSKELETAL:  Left leg: Held in Adduction and flexion at the hip.  Cellulitis noted around calf circumfirenctially without open sores.  Distally NVI  Assessment: 1. Closed dislocation of left prosthetic hip  Plan: -admit to ortho floor - bed rest - I have discussed with Dr. Alvan Dame who feels a revision is in his best interest at this juncture.  Will allow diet tonight and tomorrow and plan for potential surgery Thursday with Dr. Clearence Ped, MD Cell [phone]    10/19/2018 10:00 PM

## 2018-10-19 NOTE — ED Provider Notes (Addendum)
[facility] DEPT Provider Note   CSN: [id number] Arrival date & time: 10/19/18  1720    History   Chief Complaint Chief Complaint  Patient presents with  . Hip Pain    HPI [name] is a 78 y.o. male.     HPI Patient with a history of prosthetic left hip presents after feeling audible pop, feeling sudden onset of pain after bending over just prior to ED arrival. Patient's history is complicated with prior prosthesis, and reduction of dislocation 8 days ago under general sedation. He denies other injuries today, does note that he is undergoing therapy for cellulitis of his left shin, but states that he is otherwise been recovering well. No medication taken for pain relief of the hip injury, and the pain is worse with any motion of the leg. Past Medical History:  Diagnosis Date  . AAA (abdominal aortic aneurysm) ([place])   . Blindness of left eye   . 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 CAD (coronary artery disease)    a. s/p CABG in 10/2008 with LIMA-LAD, SVG-OM1 with Y-graft to PL branch, and SVG-RCA  . Carotid stenosis   . Cerebrovascular disease   . CHF (congestive heart failure) ([place])   . Cirrhosis ([place])    on CT a/p 07/2013, no longer drinking  . CONGENITAL UNSPEC REDUCTION DEFORMITY LOWER LIMB   . Constipation due to opioid therapy 2014   began about a year ago  . COPD (chronic obstructive pulmonary disease) ([place])   . HTN (hypertension)   . Hyperlipidemia   . 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 TOBACCO USE, QUIT     Patient Active Problem List   Diagnosis Date Noted  . Closed dislocation of left hip ([place]) 10/09/2018  . Olecranon bursitis of left elbow 07/02/2018  . Failed total hip arthroplasty with dislocation, initial encounter ([place]) 06/22/2018  . Closed left hip fracture, initial encounter ([place]) 01/26/2018  . Neck pain 12/18/2017  . Right hip pain 06/22/2017  .  Chronic diastolic heart failure ([place]) 03/16/2017  . CKD (chronic kidney disease) stage 3, GFR 30-59 ml/min ([facility]) 03/16/2017  . Diastolic dysfunction 46/65/9935  . Opiate dependence ([place]) 02/13/2017  . Uremia 01/21/2017  . Acute renal failure superimposed on stage 3 chronic kidney disease ([place]) 01/21/2017  . Atrial flutter ([place]) 11/24/2014  . Aortic stenosis 03/24/2014  . Second degree AV block, Mobitz type I 09/06/2013  . Cirrhosis ([place]) 07/28/2013  . Bladder outlet obstruction 07/28/2013  . Impaired glucose metabolism   . Chronic back pain   . Benign neoplasm of colon 01/08/2011  . Abdominal aortic aneurysm ([place]) 08/12/2010  . TOBACCO USE, QUIT 04/11/2009  . Occlusion and stenosis of carotid artery 01/08/2009  . Hyperlipidemia 11/13/2008  . ANEMIA 11/13/2008  . Essential hypertension 11/13/2008  . Coronary atherosclerosis 11/13/2008  . COPD (chronic obstructive pulmonary disease) ([place]) 11/13/2008    Past Surgical History:  Procedure Laterality Date  . CORONARY ARTERY BYPASS GRAFT  11/03/2008   Ricard Dillon - x4: left internal mammary artery to the distal left anterior descending, saphemous vein graft to the first circumflex marginal branch with a Y graft sequentiallly to a left posterolateral branch, saphenous vein graft to the distal right coronary artery  . ELECTROPHYSIOLOGIC STUDY N/A 01/18/2015   Procedure: A-Flutter Ablation;  Surgeon: Deboraha Sprang, MD;  Location: [facility];  Service: Cardiovascular;  Laterality: N/A;  . GREEN LIGHT LASER TURP (TRANSURETHRAL RESECTION  OF PROSTATE N/A 02/14/2014   Procedure: GREEN LIGHT LASER TURP (TRANSURETHRAL RESECTION OF PROSTATE;  Surgeon: Festus Aloe, MD;  Location: [facility];  Service: Urology;  Laterality: N/A;  . HIP ARTHROPLASTY Left 01/26/2018   Procedure: LEFT HIP POSTERIOR HEMIARTHROPLASTY;  Surgeon: [name], MD;  Location: [facility];  Service: Orthopedics;  Laterality: Left;  . HIP CLOSED REDUCTION Left 09/18/2018    Procedure: CLOSED MANIPULATION HIP;  Surgeon: Justice Britain, MD;  Location: [facility];  Service: Orthopedics;  Laterality: Left;  . HIP CLOSED REDUCTION Left 10/08/2018   Procedure: CLOSED MANIPULATION HIP;  Surgeon: Rod Can, MD;  Location: [facility];  Service: Orthopedics;  Laterality: Left;  . TOTAL HIP REVISION Left 06/24/2018   Procedure: ACETABULAR HIP REVISION POSTERIOR;  Surgeon: [name], MD;  Location: [facility];  Service: Orthopedics;  Laterality: Left;        Home Medications    Prior to Admission medications   Medication Sig Start Date End Date Taking? Authorizing Provider  albuterol (VENTOLIN HFA) 108 (90 Base) MCG/ACT inhaler Inhale 2 puffs into the lungs every 6 (six) hours as needed for wheezing or shortness of breath. 09/06/18   [name], MD  apixaban (ELIQUIS) 5 MG TABS tablet Take 1 tablet (5 mg total) by mouth 2 (two) times daily. 10/10/18   Ward, Yvonne Kendall, PA-C  finasteride (PROSCAR) 5 MG tablet Take 1 tablet (5 mg total) by mouth daily. 10/01/18   [name], MD  fluticasone (FLONASE) 50 MCG/ACT nasal spray PLACE 1 SPRAY INTO BOTH NOSTRILS EVERY MORNING. Patient taking differently: Place 1 spray into both nostrils daily.  09/28/18   [name], MD  furosemide (LASIX) 80 MG tablet Take 1 tablet (80 mg total) by mouth daily. 09/28/18   [name], MD  Guaifenesin 1200 MG TB12 Take 1 tablet (1,200 mg total) by mouth 2 (two) times daily. 02/15/18   [name], MD  lactulose (CHRONULAC) 10 GM/15ML solution TAKE 30 MLS BY MOUTH 2 TIMES DAILY AS NEEDED FOR MILD CONSTIPATION OR MODERATE CONSTIPATION Patient taking differently: Take 20 g by mouth 2 (two) times daily as needed for mild constipation.  06/01/18   [name], MD  lidocaine (XYLOCAINE) 5 % ointment Apply topically 2 (two) times daily as needed for moderate pain. 06/28/18   Kayleen Memos, DO  Naloxone HCl [facility] NA) Place 1 application into the nose once.     [provider]  oxyCODONE (ROXICODONE) 30 MG immediate release tablet Take 1 tablet (30 mg total) by mouth 5 (five) times daily as needed for moderate pain or severe pain. 06/25/18   Danae Orleans, PA-C  potassium chloride SA (KLOR-CON M20) 20 MEQ tablet Take 1 tablet (20 mEq total) by mouth daily. 09/28/18   [name], MD  pravastatin (PRAVACHOL) 20 MG tablet TAKE 1 TABLET BY MOUTH EVERY DAY Patient taking differently: Take 20 mg by mouth daily.  01/12/18   [name], MD  sertraline (ZOLOFT) 25 MG tablet TAKE 1 TABLET BY MOUTH EVERYDAY AT BEDTIME 06/22/18   [name], MD  spironolactone (ALDACTONE) 25 MG tablet Take 1 tablet (25 mg total) by mouth daily. 09/28/18   [name], MD  tamsulosin (FLOMAX) 0.4 MG CAPS capsule Take 1 capsule (0.4 mg total) by mouth daily. 09/28/18   [name], MD    Family History Family History  Problem Relation Age of Onset  . Cirrhosis Mother  died at 25  . Heart attack Father   . Other Brother        GSW  . Other Brother        died in house fire  . Cancer Brother        unsure of type Believes colon or prostate/fim  . Colon cancer Neg Hx   . Stomach cancer Neg Hx     Social History Social History   Tobacco Use  . Smoking status: Former Research scientist (life sciences)  . Smokeless tobacco: Never Used  Substance Use Topics  . Alcohol use: No    Alcohol/week: 0.0 standard drinks    Comment: History of heavy alcohol use per pt. Quit many years ago1/1/ 2005  . Drug use: No     Allergies   Doxycycline, Amlodipine, Fish allergy, Hydrocodone, Other, and Tylenol [acetaminophen]   Review of Systems Review of Systems  Constitutional:       Per HPI, otherwise negative  HENT:       Per HPI, otherwise negative  Respiratory:       Per HPI, otherwise negative  Cardiovascular:       Per HPI, otherwise negative  Gastrointestinal: Negative for vomiting.  Endocrine:       Negative aside from HPI   Genitourinary:       Neg aside from HPI   Musculoskeletal:       Per HPI, otherwise negative  Skin: Negative.   Neurological: Negative for syncope and weakness.     Physical Exam Updated Vital Signs BP (!) 136/113   Pulse (!) 57   Temp 98.6 F (37 C) (Oral)   Resp 20   Ht 5\' 10"  (1.778 m)   Wt 78 kg   SpO2 96%   BMI 24.68 kg/m   Physical Exam Vitals signs and nursing note reviewed.  Constitutional:      General: He is not in acute distress.    Appearance: He is well-developed.  HENT:     Head: Normocephalic and atraumatic.  Eyes:     Conjunctiva/sclera: Conjunctivae normal.  Cardiovascular:     Rate and Rhythm: Normal rate and regular rhythm.  Pulmonary:     Effort: Pulmonary effort is normal. No respiratory distress.     Breath sounds: No stridor.  Abdominal:     General: There is no distension.  Musculoskeletal:       Legs:  Skin:    General: Skin is warm and dry.  Neurological:     Mental Status: He is alert and oriented to person, place, and time.      ED Treatments / Results  Labs (all labs ordered are listed, but only abnormal results are displayed) Labs Reviewed  SARS CORONAVIRUS 2 (HOSPITAL ORDER, [facility])     Radiology Dg Hip Port Unilat With Pelvis 1v Left  Result Date: 10/19/2018 CLINICAL DATA:  Dislocated hip EXAM: DG HIP (WITH OR WITHOUT PELVIS) 1V PORT LEFT COMPARISON:  10/08/2018 FINDINGS: There is superior dislocation of the left hip replacement. No visible fracture. 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 Electronically Signed   By: Rolm Baptise M.D.   On: 10/19/2018 19:04    Procedures .Ortho Injury Treatment  Date/Time: 10/19/2018 8:20 PM Performed by: Carmin Muskrat, MD Authorized by: Carmin Muskrat, MD   Consent:    Consent obtained:  Verbal   Consent given by:  Patient   Risks discussed:  Fracture, irreducible dislocation and recurrent dislocation   Alternatives  discussed:  No treatment and alternative treatment Universal protocol:    Procedure explained and questions answered to patient or proxy's satisfaction: yes     Relevant documents present and verified: yes     Test results available and properly labeled: yes     Imaging studies available: yes     Required blood products, implants, devices, and special equipment available: yes     Site/side marked: yes     Immediately prior to procedure a time out was called: yes     Patient identity confirmed:  Verbally with patientInjury location: hip Location details: left hip Injury type: dislocation Dislocation type: posterior Spontaneous dislocation: yes Prosthesis: yes Pre-procedure neurovascular assessment: neurovascularly intact Pre-procedure distal perfusion: normal Pre-procedure neurological function: normal Pre-procedure range of motion: reduced  Anesthesia: Local anesthesia used: no  Patient sedated: Yes. Refer to sedation procedure documentation for details of sedation. Manipulation performed: yes Reduction method: external rotation and traction and counter traction Reduction successful: no Post-procedure neurovascular assessment: post-procedure neurovascularly intact Post-procedure distal perfusion: normal Post-procedure neurological function: normal Post-procedure range of motion: unchanged Comments: In spite of several attempts at reduction, this was unsuccessful.    (including critical care time)  Medications Ordered in ED Medications  fentaNYL (SUBLIMAZE) injection 50 mcg (50 mcg Intravenous Given 10/19/18 1756)  propofol (DIPRIVAN) 10 mg/mL bolus/IV push 60 mg (60 mg Intravenous Given 10/19/18 2011)  propofol (DIPRIVAN) 10 mg/mL bolus/IV push (30 mg Intravenous Given 10/19/18 2017)  fentaNYL (SUBLIMAZE) injection 50 mcg (50 mcg Intravenous Given 10/19/18 2051)     Initial Impression / Assessment and Plan / ED Course  I have reviewed the triage vital signs and the nursing  notes.  Pertinent labs & imaging results that were available during my care of the patient were reviewed by me and considered in my medical decision making (see chart for details).    Chart review notable for ongoing orthopedic care, with concern for his recurrent dislocation.  Per review, the prior reduction attempts were complicated.    9:23 PM Patient fully awake after sedation Please see sedation notes from Dr. Rex Kras for further details. I discussed the patient's case with his orthopedic team, given the lack of successful reduction, in spite of sedation in the emergency department. With need for operating room reduction, and revision of his prosthesis, the patient will be admitted for further care.  Patient has no other complaints, has had dressing of his lower leg cellulitis, is awake, alert, hemodynamically unremarkable.  COVID test pending.   Final Clinical Impressions(s) / ED Diagnoses   Final diagnoses:  Dislocation closed  Dislocation of left hip, initial encounter [facility])     Carmin Muskrat, MD 10/19/18 2124    Carmin Muskrat, MD 10/19/18 2150

## 2018-10-19 NOTE — Sedation Documentation (Signed)
Dr. Vanita Panda and Dr. Rex Kras attempted to work on reducing the left hip.

## 2018-10-20 ENCOUNTER — Other Ambulatory Visit: Payer: Self-pay

## 2018-10-20 LAB — CBC
HCT: 33.8 % — ABNORMAL LOW (ref 39.0–52.0)
Hemoglobin: 10.3 g/dL — ABNORMAL LOW (ref 13.0–17.0)
MCH: 27.9 pg (ref 26.0–34.0)
MCHC: 30.5 g/dL (ref 30.0–36.0)
MCV: 91.6 fL (ref 80.0–100.0)
Platelets: 248 10*3/uL (ref 150–400)
RBC: 3.69 MIL/uL — ABNORMAL LOW (ref 4.22–5.81)
RDW: 16.4 % — ABNORMAL HIGH (ref 11.5–15.5)
WBC: 6.4 10*3/uL (ref 4.0–10.5)
nRBC: 0 % (ref 0.0–0.2)

## 2018-10-20 LAB — CREATININE, SERUM
Creatinine, Ser: 1.99 mg/dL — ABNORMAL HIGH (ref 0.61–1.24)
GFR calc Af Amer: 37 mL/min — ABNORMAL LOW (ref >60)
GFR calc non Af Amer: 32 mL/min — ABNORMAL LOW (ref >60)

## 2018-10-20 MED ORDER — TRAMADOL HCL 50 MG PO TABS
50.0000 mg | ORAL_TABLET | Freq: Four times a day (QID) | ORAL | Status: DC
  Administered 2018-10-20 – 2018-10-21 (×2): 50 mg via ORAL
  Filled 2018-10-20 (×3): qty 1

## 2018-10-20 MED ORDER — ENOXAPARIN SODIUM 40 MG/0.4ML ~~LOC~~ SOLN
40.0000 mg | Freq: Every day | SUBCUTANEOUS | Status: AC
  Administered 2018-10-20: 40 mg via SUBCUTANEOUS
  Filled 2018-10-20 (×2): qty 0.4

## 2018-10-20 MED ORDER — ONDANSETRON HCL 4 MG PO TABS: 4.0000 mg | ORAL_TABLET | Freq: Four times a day (QID) | ORAL | Status: DC | PRN

## 2018-10-20 MED ORDER — METHOCARBAMOL 1000 MG/10ML IJ SOLN
500.0000 mg | Freq: Four times a day (QID) | INTRAVENOUS | Status: DC | PRN
  Filled 2018-10-20: qty 5

## 2018-10-20 MED ORDER — OXYCODONE HCL 5 MG PO TABS: 5.0000 mg | ORAL_TABLET | ORAL | Status: DC | PRN

## 2018-10-20 MED ORDER — OXYCODONE HCL 5 MG PO TABS
10.0000 mg | ORAL_TABLET | ORAL | Status: DC | PRN
  Administered 2018-10-20 (×6): 15 mg via ORAL
  Filled 2018-10-20 (×6): qty 3

## 2018-10-20 MED ORDER — ONDANSETRON HCL 4 MG/2ML IJ SOLN: 4.0000 mg | Freq: Four times a day (QID) | INTRAMUSCULAR | Status: DC | PRN

## 2018-10-20 MED ORDER — GABAPENTIN 300 MG PO CAPS
300.0000 mg | ORAL_CAPSULE | Freq: Three times a day (TID) | ORAL | Status: DC
  Filled 2018-10-20 (×2): qty 1

## 2018-10-20 MED ORDER — HYDROMORPHONE HCL 1 MG/ML IJ SOLN
0.5000 mg | INTRAMUSCULAR | Status: DC | PRN
  Administered 2018-10-20 – 2018-10-21 (×7): 1 mg via INTRAVENOUS
  Filled 2018-10-20 (×7): qty 1

## 2018-10-20 MED ORDER — METHOCARBAMOL 500 MG PO TABS
500.0000 mg | ORAL_TABLET | Freq: Four times a day (QID) | ORAL | Status: DC | PRN
  Administered 2018-10-20 – 2018-10-21 (×4): 500 mg via ORAL
  Filled 2018-10-20 (×4): qty 1

## 2018-10-20 NOTE — Progress Notes (Signed)
Report received from [facility], pt received to room 1327 via stretcher and transferred to bed and aligned for comfort. Educated pt on use of call bell for needs/safety.

## 2018-10-20 NOTE — Plan of Care (Signed)
Care plan initiated.

## 2018-10-21 ENCOUNTER — Inpatient Hospital Stay (HOSPITAL_COMMUNITY): Payer: Medicare Other

## 2018-10-21 ENCOUNTER — Encounter (HOSPITAL_COMMUNITY)
Admission: EM | Disposition: A | Discharge status: Home / Self Care | Payer: Self-pay | Source: Home / Self Care | Attending: Orthopedic Surgery

## 2018-10-21 ENCOUNTER — Inpatient Hospital Stay (HOSPITAL_COMMUNITY): Payer: Medicare Other | Admitting: Anesthesiology

## 2018-10-21 ENCOUNTER — Encounter (HOSPITAL_COMMUNITY): Payer: Self-pay | Admitting: *Deleted

## 2018-10-21 DIAGNOSIS — Z96649 Presence of unspecified artificial hip joint: Secondary | ICD-10-CM

## 2018-10-21 HISTORY — PX: TOTAL HIP REVISION: SHX763

## 2018-10-21 LAB — PREPARE RBC (CROSSMATCH)

## 2018-10-21 SURGERY — TOTAL HIP REVISION
Anesthesia: General | Site: Hip | Laterality: Left

## 2018-10-21 MED ORDER — ALBUTEROL SULFATE HFA 108 (90 BASE) MCG/ACT IN AERS: 2.0000 | INHALATION_SPRAY | Freq: Four times a day (QID) | RESPIRATORY_TRACT | Status: DC | PRN

## 2018-10-21 MED ORDER — METHOCARBAMOL 500 MG PO TABS
500.0000 mg | ORAL_TABLET | Freq: Four times a day (QID) | ORAL | Status: DC | PRN
  Administered 2018-10-21 – 2018-10-22 (×3): 500 mg via ORAL
  Filled 2018-10-21 (×3): qty 1

## 2018-10-21 MED ORDER — MIDAZOLAM HCL 2 MG/2ML IJ SOLN
INTRAMUSCULAR | Status: AC
  Filled 2018-10-21: qty 2

## 2018-10-21 MED ORDER — ROCURONIUM BROMIDE 10 MG/ML (PF) SYRINGE
PREFILLED_SYRINGE | INTRAVENOUS | Status: DC | PRN
  Administered 2018-10-21: 50 mg via INTRAVENOUS

## 2018-10-21 MED ORDER — MENTHOL 3 MG MT LOZG: 1.0000 | LOZENGE | OROMUCOSAL | Status: DC | PRN

## 2018-10-21 MED ORDER — ROCURONIUM BROMIDE 10 MG/ML (PF) SYRINGE
PREFILLED_SYRINGE | INTRAVENOUS | Status: AC
  Filled 2018-10-21: qty 10

## 2018-10-21 MED ORDER — ONDANSETRON HCL 4 MG/2ML IJ SOLN
INTRAMUSCULAR | Status: DC | PRN
  Administered 2018-10-21: 4 mg via INTRAVENOUS

## 2018-10-21 MED ORDER — METOCLOPRAMIDE HCL 5 MG/ML IJ SOLN: 5.0000 mg | Freq: Three times a day (TID) | INTRAMUSCULAR | Status: DC | PRN

## 2018-10-21 MED ORDER — FENTANYL CITRATE (PF) 100 MCG/2ML IJ SOLN
INTRAMUSCULAR | Status: AC
  Filled 2018-10-21: qty 2

## 2018-10-21 MED ORDER — BISACODYL 10 MG RE SUPP: 10.0000 mg | Freq: Every day | RECTAL | Status: DC | PRN

## 2018-10-21 MED ORDER — DEXAMETHASONE SODIUM PHOSPHATE 10 MG/ML IJ SOLN
INTRAMUSCULAR | Status: DC | PRN
  Administered 2018-10-21: 10 mg via INTRAVENOUS

## 2018-10-21 MED ORDER — SERTRALINE HCL 25 MG PO TABS: 25.0000 mg | ORAL_TABLET | Freq: Every day | ORAL | Status: DC | PRN

## 2018-10-21 MED ORDER — SODIUM CHLORIDE 0.9 % IR SOLN
Status: DC | PRN
  Administered 2018-10-21: 1000 mL

## 2018-10-21 MED ORDER — PROPOFOL 10 MG/ML IV BOLUS
INTRAVENOUS | Status: AC
  Filled 2018-10-21: qty 20

## 2018-10-21 MED ORDER — CEFAZOLIN SODIUM-DEXTROSE 2-4 GM/100ML-% IV SOLN
2.0000 g | Freq: Four times a day (QID) | INTRAVENOUS | Status: AC
  Administered 2018-10-21 (×2): 2 g via INTRAVENOUS
  Filled 2018-10-21 (×2): qty 100

## 2018-10-21 MED ORDER — ALBUTEROL SULFATE (2.5 MG/3ML) 0.083% IN NEBU: 2.5000 mg | INHALATION_SOLUTION | Freq: Four times a day (QID) | RESPIRATORY_TRACT | Status: DC | PRN

## 2018-10-21 MED ORDER — PROPOFOL 10 MG/ML IV BOLUS
INTRAVENOUS | Status: DC | PRN
  Administered 2018-10-21: 90 mg via INTRAVENOUS

## 2018-10-21 MED ORDER — HYDROMORPHONE HCL 1 MG/ML IJ SOLN
0.2500 mg | INTRAMUSCULAR | Status: DC | PRN
  Administered 2018-10-21 (×4): 0.5 mg via INTRAVENOUS

## 2018-10-21 MED ORDER — OXYCODONE HCL 30 MG PO TABS: 30.0000 mg | ORAL_TABLET | Freq: Four times a day (QID) | ORAL | 0 refills | Status: DC | PRN

## 2018-10-21 MED ORDER — METHOCARBAMOL 500 MG IVPB - SIMPLE MED
500.0000 mg | Freq: Four times a day (QID) | INTRAVENOUS | Status: DC | PRN
  Filled 2018-10-21: qty 50

## 2018-10-21 MED ORDER — METOCLOPRAMIDE HCL 5 MG PO TABS: 5.0000 mg | ORAL_TABLET | Freq: Three times a day (TID) | ORAL | Status: DC | PRN

## 2018-10-21 MED ORDER — OXYCODONE HCL 5 MG PO TABS
30.0000 mg | ORAL_TABLET | Freq: Four times a day (QID) | ORAL | Status: DC | PRN
  Administered 2018-10-21 – 2018-10-22 (×4): 30 mg via ORAL
  Filled 2018-10-21 (×5): qty 6

## 2018-10-21 MED ORDER — SUCCINYLCHOLINE CHLORIDE 200 MG/10ML IV SOSY
PREFILLED_SYRINGE | INTRAVENOUS | Status: DC | PRN
  Administered 2018-10-21: 120 mg via INTRAVENOUS

## 2018-10-21 MED ORDER — METHOCARBAMOL 500 MG PO TABS: 500.0000 mg | ORAL_TABLET | Freq: Four times a day (QID) | ORAL | 0 refills | Status: DC | PRN

## 2018-10-21 MED ORDER — POVIDONE-IODINE 10 % EX SWAB
2.0000 "application " | Freq: Once | CUTANEOUS | Status: AC
  Administered 2018-10-21: 2 via TOPICAL

## 2018-10-21 MED ORDER — PRAVASTATIN SODIUM 20 MG PO TABS
20.0000 mg | ORAL_TABLET | Freq: Every evening | ORAL | Status: DC
  Administered 2018-10-21 – 2018-10-22 (×2): 20 mg via ORAL
  Filled 2018-10-21 (×2): qty 1

## 2018-10-21 MED ORDER — HYDROMORPHONE HCL 1 MG/ML IJ SOLN
INTRAMUSCULAR | Status: AC
  Filled 2018-10-21: qty 2

## 2018-10-21 MED ORDER — TRANEXAMIC ACID-NACL 1000-0.7 MG/100ML-% IV SOLN
1000.0000 mg | INTRAVENOUS | Status: AC
  Administered 2018-10-21: 1000 mg via INTRAVENOUS
  Filled 2018-10-21: qty 100

## 2018-10-21 MED ORDER — HYDROMORPHONE HCL 2 MG PO TABS
2.0000 mg | ORAL_TABLET | ORAL | Status: DC | PRN
  Administered 2018-10-21 (×2): 4 mg via ORAL
  Filled 2018-10-21: qty 1
  Filled 2018-10-21: qty 2
  Filled 2018-10-21: qty 1

## 2018-10-21 MED ORDER — SPIRONOLACTONE 25 MG PO TABS
25.0000 mg | ORAL_TABLET | Freq: Every day | ORAL | Status: DC
  Administered 2018-10-22: 25 mg via ORAL
  Filled 2018-10-21: qty 1

## 2018-10-21 MED ORDER — LACTATED RINGERS IV SOLN
INTRAVENOUS | Status: DC
  Administered 2018-10-21 (×2): via INTRAVENOUS

## 2018-10-21 MED ORDER — DEXAMETHASONE SODIUM PHOSPHATE 10 MG/ML IJ SOLN
10.0000 mg | Freq: Once | INTRAMUSCULAR | Status: AC
  Administered 2018-10-22: 10 mg via INTRAVENOUS
  Filled 2018-10-21: qty 1

## 2018-10-21 MED ORDER — HYDROMORPHONE HCL 1 MG/ML IJ SOLN: 0.5000 mg | INTRAMUSCULAR | Status: DC | PRN

## 2018-10-21 MED ORDER — FINASTERIDE 5 MG PO TABS
5.0000 mg | ORAL_TABLET | Freq: Every day | ORAL | Status: DC
  Administered 2018-10-22: 5 mg via ORAL
  Filled 2018-10-21: qty 1

## 2018-10-21 MED ORDER — SODIUM CHLORIDE 0.9 % IV SOLN
INTRAVENOUS | Status: DC | PRN
  Administered 2018-10-21: 11:00:00 via INTRAVENOUS

## 2018-10-21 MED ORDER — ONDANSETRON HCL 4 MG/2ML IJ SOLN: 4.0000 mg | Freq: Four times a day (QID) | INTRAMUSCULAR | Status: DC | PRN

## 2018-10-21 MED ORDER — KETAMINE HCL 10 MG/ML IJ SOLN
INTRAMUSCULAR | Status: AC
  Filled 2018-10-21: qty 1

## 2018-10-21 MED ORDER — FERROUS SULFATE 325 (65 FE) MG PO TABS: 325.0000 mg | ORAL_TABLET | Freq: Three times a day (TID) | ORAL | 0 refills | Status: DC

## 2018-10-21 MED ORDER — CEFAZOLIN SODIUM-DEXTROSE 2-4 GM/100ML-% IV SOLN
2.0000 g | INTRAVENOUS | Status: AC
  Administered 2018-10-21: 2 g via INTRAVENOUS
  Filled 2018-10-21: qty 100

## 2018-10-21 MED ORDER — DIPHENHYDRAMINE HCL 12.5 MG/5ML PO ELIX: 12.5000 mg | ORAL_SOLUTION | ORAL | Status: DC | PRN

## 2018-10-21 MED ORDER — EPHEDRINE 5 MG/ML INJ
INTRAVENOUS | Status: AC
  Filled 2018-10-21: qty 10

## 2018-10-21 MED ORDER — ONDANSETRON HCL 4 MG/2ML IJ SOLN: 4.0000 mg | Freq: Once | INTRAMUSCULAR | Status: DC | PRN

## 2018-10-21 MED ORDER — CHLORHEXIDINE GLUCONATE 4 % EX LIQD: 60.0000 mL | Freq: Once | CUTANEOUS | Status: DC

## 2018-10-21 MED ORDER — GUAIFENESIN ER 600 MG PO TB12
1200.0000 mg | ORAL_TABLET | Freq: Two times a day (BID) | ORAL | Status: DC | PRN
  Administered 2018-10-21: 1200 mg via ORAL
  Filled 2018-10-21: qty 2

## 2018-10-21 MED ORDER — ONDANSETRON HCL 4 MG PO TABS: 4.0000 mg | ORAL_TABLET | Freq: Four times a day (QID) | ORAL | Status: DC | PRN

## 2018-10-21 MED ORDER — SUGAMMADEX SODIUM 200 MG/2ML IV SOLN
INTRAVENOUS | Status: DC | PRN
  Administered 2018-10-21: 150 mg via INTRAVENOUS

## 2018-10-21 MED ORDER — TAMSULOSIN HCL 0.4 MG PO CAPS
0.4000 mg | ORAL_CAPSULE | Freq: Every evening | ORAL | Status: DC
  Administered 2018-10-21: 0.4 mg via ORAL
  Filled 2018-10-21: qty 1

## 2018-10-21 MED ORDER — DEXAMETHASONE SODIUM PHOSPHATE 10 MG/ML IJ SOLN
INTRAMUSCULAR | Status: AC
  Filled 2018-10-21: qty 1

## 2018-10-21 MED ORDER — PHENOL 1.4 % MT LIQD: 1.0000 | OROMUCOSAL | Status: DC | PRN

## 2018-10-21 MED ORDER — DEXAMETHASONE SODIUM PHOSPHATE 10 MG/ML IJ SOLN: 8.0000 mg | Freq: Once | INTRAMUSCULAR | Status: DC

## 2018-10-21 MED ORDER — KETAMINE HCL 10 MG/ML IJ SOLN
INTRAMUSCULAR | Status: DC | PRN
  Administered 2018-10-21: 10 mg via INTRAVENOUS
  Administered 2018-10-21 (×2): 20 mg via INTRAVENOUS

## 2018-10-21 MED ORDER — POTASSIUM CHLORIDE CRYS ER 20 MEQ PO TBCR
20.0000 meq | EXTENDED_RELEASE_TABLET | Freq: Every day | ORAL | Status: DC
  Administered 2018-10-22: 20 meq via ORAL
  Filled 2018-10-21: qty 1

## 2018-10-21 MED ORDER — APIXABAN 2.5 MG PO TABS
2.5000 mg | ORAL_TABLET | Freq: Two times a day (BID) | ORAL | Status: DC
  Administered 2018-10-22: 2.5 mg via ORAL
  Filled 2018-10-21: qty 1

## 2018-10-21 MED ORDER — FLUTICASONE PROPIONATE 50 MCG/ACT NA SUSP
1.0000 | Freq: Every day | NASAL | Status: DC
  Administered 2018-10-21: 1 via NASAL
  Filled 2018-10-21: qty 16

## 2018-10-21 MED ORDER — ONDANSETRON HCL 4 MG/2ML IJ SOLN
INTRAMUSCULAR | Status: AC
  Filled 2018-10-21: qty 2

## 2018-10-21 MED ORDER — POLYETHYLENE GLYCOL 3350 17 G PO PACK
17.0000 g | PACK | Freq: Two times a day (BID) | ORAL | Status: DC
  Administered 2018-10-21 – 2018-10-22 (×2): 17 g via ORAL
  Filled 2018-10-21 (×2): qty 1

## 2018-10-21 MED ORDER — TRANEXAMIC ACID-NACL 1000-0.7 MG/100ML-% IV SOLN
1000.0000 mg | Freq: Once | INTRAVENOUS | Status: AC
  Administered 2018-10-21: 1000 mg via INTRAVENOUS
  Filled 2018-10-21: qty 100

## 2018-10-21 MED ORDER — EPHEDRINE SULFATE-NACL 50-0.9 MG/10ML-% IV SOSY
PREFILLED_SYRINGE | INTRAVENOUS | Status: DC | PRN
  Administered 2018-10-21 (×4): 10 mg via INTRAVENOUS

## 2018-10-21 MED ORDER — ALUM & MAG HYDROXIDE-SIMETH 200-200-20 MG/5ML PO SUSP: 15.0000 mL | ORAL | Status: DC | PRN

## 2018-10-21 MED ORDER — FENTANYL CITRATE (PF) 100 MCG/2ML IJ SOLN
INTRAMUSCULAR | Status: DC | PRN
  Administered 2018-10-21: 50 ug via INTRAVENOUS
  Administered 2018-10-21 (×3): 25 ug via INTRAVENOUS
  Administered 2018-10-21: 50 ug via INTRAVENOUS
  Administered 2018-10-21: 25 ug via INTRAVENOUS

## 2018-10-21 MED ORDER — FERROUS SULFATE 325 (65 FE) MG PO TABS
325.0000 mg | ORAL_TABLET | Freq: Three times a day (TID) | ORAL | Status: DC
  Administered 2018-10-21 – 2018-10-22 (×2): 325 mg via ORAL
  Filled 2018-10-21 (×2): qty 1

## 2018-10-21 MED ORDER — LIDOCAINE 2% (20 MG/ML) 5 ML SYRINGE
INTRAMUSCULAR | Status: DC | PRN
  Administered 2018-10-21: 60 mg via INTRAVENOUS

## 2018-10-21 MED ORDER — MAGNESIUM CITRATE PO SOLN: 1.0000 | Freq: Once | ORAL | Status: DC | PRN

## 2018-10-21 MED ORDER — DOCUSATE SODIUM 100 MG PO CAPS
100.0000 mg | ORAL_CAPSULE | Freq: Two times a day (BID) | ORAL | Status: DC
  Administered 2018-10-21 – 2018-10-22 (×2): 100 mg via ORAL
  Filled 2018-10-21 (×2): qty 1

## 2018-10-21 MED ORDER — FUROSEMIDE 40 MG PO TABS
80.0000 mg | ORAL_TABLET | Freq: Every day | ORAL | Status: DC
  Administered 2018-10-22: 80 mg via ORAL
  Filled 2018-10-21: qty 2

## 2018-10-21 MED ORDER — SODIUM CHLORIDE 0.9 % IV SOLN
INTRAVENOUS | Status: DC
  Administered 2018-10-21: 16:00:00 via INTRAVENOUS

## 2018-10-21 SURGICAL SUPPLY — 63 items
BAG DECANTER FOR FLEXI CONT (MISCELLANEOUS) ×2 IMPLANT
BAG ZIPLOCK 12X15 (MISCELLANEOUS) ×2 IMPLANT
BLADE SAW SGTL 11.0X1.19X90.0M (BLADE) IMPLANT
BLADE SAW SGTL 18X1.27X75 (BLADE) ×1 IMPLANT
BRUSH FEMORAL CANAL (MISCELLANEOUS) IMPLANT
COVER SURGICAL LIGHT HANDLE (MISCELLANEOUS) ×2 IMPLANT
COVER WAND RF STERILE (DRAPES) IMPLANT
DERMABOND ADVANCED (GAUZE/BANDAGES/DRESSINGS) ×1
DERMABOND ADVANCED .7 DNX12 (GAUZE/BANDAGES/DRESSINGS) ×1 IMPLANT
DRAPE INCISE IOBAN 85X60 (DRAPES) ×1 IMPLANT
DRAPE ORTHO SPLIT 77X108 STRL (DRAPES) ×2
DRAPE POUCH INSTRU U-SHP 10X18 (DRAPES) ×2 IMPLANT
DRAPE SURG 17X11 SM STRL (DRAPES) ×2 IMPLANT
DRAPE SURG ORHT 6 SPLT 77X108 (DRAPES) ×2 IMPLANT
DRAPE U-SHAPE 47X51 STRL (DRAPES) ×2 IMPLANT
DRESSING AQUACEL AG SP 3.5X10 (GAUZE/BANDAGES/DRESSINGS) IMPLANT
DRSG AQUACEL AG ADV 3.5X10 (GAUZE/BANDAGES/DRESSINGS) IMPLANT
DRSG AQUACEL AG ADV 3.5X14 (GAUZE/BANDAGES/DRESSINGS) ×1 IMPLANT
DRSG AQUACEL AG SP 3.5X10 (GAUZE/BANDAGES/DRESSINGS)
DURAPREP 26ML APPLICATOR (WOUND CARE) ×2 IMPLANT
ELECT BLADE TIP CTD 4 INCH (ELECTRODE) ×3 IMPLANT
ELECT REM PT RETURN 15FT ADLT (MISCELLANEOUS) ×2 IMPLANT
FACESHIELD WRAPAROUND (MASK) ×8 IMPLANT
FACESHIELD WRAPAROUND OR TEAM (MASK) ×4 IMPLANT
GAUZE SPONGE 2X2 8PLY STRL LF (GAUZE/BANDAGES/DRESSINGS) ×1 IMPLANT
GLOVE BIOGEL M 7.0 STRL (GLOVE) IMPLANT
GLOVE BIOGEL PI IND STRL 7.5 (GLOVE) ×1 IMPLANT
GLOVE BIOGEL PI IND STRL 8.5 (GLOVE) ×1 IMPLANT
GLOVE BIOGEL PI INDICATOR 7.5 (GLOVE) ×1
GLOVE BIOGEL PI INDICATOR 8.5 (GLOVE) ×1
GLOVE ECLIPSE 8.0 STRL XLNG CF (GLOVE) ×4 IMPLANT
GOWN STRL REUS W/TWL 2XL LVL3 (GOWN DISPOSABLE) ×2 IMPLANT
GOWN STRL REUS W/TWL LRG LVL3 (GOWN DISPOSABLE) ×2 IMPLANT
HANDPIECE INTERPULSE COAX TIP (DISPOSABLE)
HEAD FEM STD 32X+9 STRL (Hips) ×1 IMPLANT
KIT TURNOVER KIT A (KITS) IMPLANT
LINER CONTRUCT 32-54 4/10 (Liner) ×1 IMPLANT
MANIFOLD NEPTUNE II (INSTRUMENTS) ×2 IMPLANT
MARKER SKIN DUAL TIP RULER LAB (MISCELLANEOUS) ×2 IMPLANT
NDL SAFETY ECLIPSE 18X1.5 (NEEDLE) ×1 IMPLANT
NEEDLE HYPO 18GX1.5 SHARP (NEEDLE) ×1
NS IRRIG 1000ML POUR BTL (IV SOLUTION) ×3 IMPLANT
PADDING CAST COTTON 6X4 STRL (CAST SUPPLIES) ×2 IMPLANT
PRESSURIZER FEMORAL UNIV (MISCELLANEOUS) IMPLANT
PROTECTOR NERVE ULNAR (MISCELLANEOUS) ×2 IMPLANT
SET HNDPC FAN SPRY TIP SCT (DISPOSABLE) IMPLANT
SPONGE GAUZE 2X2 STER 10/PKG (GAUZE/BANDAGES/DRESSINGS) ×1
SPONGE LAP 18X18 RF (DISPOSABLE) ×1 IMPLANT
SPONGE LAP 4X18 RFD (DISPOSABLE) ×1 IMPLANT
STAPLER VISISTAT 35W (STAPLE) ×1 IMPLANT
SUCTION FRAZIER HANDLE 10FR (MISCELLANEOUS) ×1
SUCTION TUBE FRAZIER 10FR DISP (MISCELLANEOUS) ×1 IMPLANT
SUT MNCRL AB 3-0 PS2 18 (SUTURE) ×1 IMPLANT
SUT STRATAFIX PDS+ 0 24IN (SUTURE) ×2 IMPLANT
SUT VIC AB 1 CT1 36 (SUTURE) ×2 IMPLANT
SUT VIC AB 2-0 CT1 27 (SUTURE) ×3
SUT VIC AB 2-0 CT1 TAPERPNT 27 (SUTURE) ×3 IMPLANT
TOWEL OR 17X26 10 PK STRL BLUE (TOWEL DISPOSABLE) ×4 IMPLANT
TOWER CARTRIDGE SMART MIX (DISPOSABLE) IMPLANT
TRAY FOLEY MTR SLVR 16FR STAT (SET/KITS/TRAYS/PACK) ×1 IMPLANT
TUBE KAMVAC SUCTION (TUBING) IMPLANT
WATER STERILE IRR 1000ML POUR (IV SOLUTION) ×3 IMPLANT
YANKAUER SUCT BULB TIP 10FT TU (MISCELLANEOUS) ×2 IMPLANT

## 2018-10-21 NOTE — Anesthesia Procedure Notes (Signed)
Procedure Name: Intubation Date/Time: 10/21/2018 10:19 AM Performed by: Sharlette Dense, CRNA Patient Re-evaluated:Patient Re-evaluated prior to induction Oxygen Delivery Method: Circle system utilized Preoxygenation: Pre-oxygenation with 100% oxygen Induction Type: IV induction Laryngoscope Size: Miller and 3 Grade View: Grade I Tube type: Oral Tube size: 8.0 mm Number of attempts: 1 Airway Equipment and Method: Stylet Placement Confirmation: ETT inserted through vocal cords under direct vision,  positive ETCO2 and breath sounds checked- equal and bilateral Secured at: 22 cm Tube secured with: Tape Dental Injury: Teeth and Oropharynx as per pre-operative assessment

## 2018-10-21 NOTE — Transfer of Care (Signed)
Immediate Anesthesia Transfer of Care Note  Patient: [name]  Procedure(s) Performed: POSTERIOR REVISION HIP WITH POSSIBLE CONSTRAINED LINER (Left Hip)  Patient Location: PACU  Anesthesia Type:General  Level of Consciousness: drowsy  Airway & Oxygen Therapy: Patient Spontanous Breathing and Patient connected to face mask oxygen  Post-op Assessment: Report given to RN and Post -op Vital signs reviewed and stable  Post vital signs: Reviewed and stable  Last Vitals:  Vitals Value Taken Time  BP    Temp    Pulse 76 10/21/18 1215  Resp 17 10/21/18 1215  SpO2 100 % 10/21/18 1215  Vitals shown include unvalidated device data.  Last Pain:  Vitals:   10/21/18 0849  TempSrc:   PainSc: 9       Patients Stated Pain Goal: 4 (49/20/10 0712)  Complications: No apparent anesthesia complications

## 2018-10-21 NOTE — Progress Notes (Signed)
Alvan Dame, MD was paged regarding the pt's request for Mucinex.

## 2018-10-21 NOTE — Anesthesia Preprocedure Evaluation (Addendum)
Anesthesia Evaluation  Patient identified by MRN, date of birth, ID band Patient awake    Reviewed: Allergy & Precautions, NPO status , Patient's Chart, lab work & pertinent test results  Airway Mallampati: II  TM Distance: >3 FB Neck ROM: Full    Dental  (+) Edentulous Upper, Edentulous Lower   Pulmonary COPD,  COPD inhaler, former smoker,    Pulmonary exam normal breath sounds clear to auscultation       Cardiovascular hypertension, Pt. on medications + CAD, + CABG, + Peripheral Vascular Disease and +CHF  + dysrhythmias Atrial Fibrillation + Valvular Problems/Murmurs MR  Rhythm:Irregular Rate:Normal + Systolic murmurs 2nd Degree AV Block Mobitz type 1 Carotid stenosis Hx/o CABG 2010 x 4 v EKG Atrial Fibrillation Echo 06/2018 1. The left ventricle has hyperdynamic systolic function, with an ejection fraction of >65%. The cavity size was severely dilated. There is mildly increased left ventricular wall thickness. Left ventricular diastology could not be evaluated secondary to atrial fibrillation. No evidence of left ventricular regional wall motion abnormalities.  2. The right ventricle was not well visualized. The cavity was mildly enlarged. There is not assessed.  3. Left atrial size was severely dilated.  4. Right atrial size was severely dilated.  5. The mitral valve is degenerative. Mild thickening of the mitral valve leaflet. Mitral valve regurgitation is mild to moderate by color flow Doppler.  6. The tricuspid valve is normal in structure.  7. The aortic valve is tricuspid Mild sclerosis of the aortic valve.  8. The aortic root and ascending aorta are normal in size and structure.  9. The inferior vena cava was dilated in size with <50% respiratory variability. 10. No evidence of left ventricular regional wall motion abnormalities. 11. Possible PFO.   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   Abdominal   Peds  Hematology  (+) anemia , Eliquis therapy- last dose 10/20/2018   Anesthesia Other Findings   Reproductive/Obstetrics                            Anesthesia Physical Anesthesia Plan  ASA: III  Anesthesia Plan: General   Post-op Pain Management:    Induction:   PONV Risk Score and Plan: 2  Airway Management Planned: Oral ETT  Additional Equipment:   Intra-op Plan:   Post-operative Plan: Extubation in OR  Informed Consent: I have reviewed the patients History and Physical, chart, labs and discussed the procedure including the risks, benefits and alternatives for the proposed anesthesia with the patient or authorized representative who has indicated his/her understanding and acceptance.       Plan Discussed with: CRNA and Surgeon  Anesthesia Plan Comments:         Anesthesia Quick Evaluation

## 2018-10-21 NOTE — Anesthesia Postprocedure Evaluation (Signed)
Anesthesia Post Note  Patient: JOSHUAN BOLANDER  Procedure(s) Performed: POSTERIOR REVISION HIP WITH POSSIBLE CONSTRAINED LINER (Left Hip)     Patient location during evaluation: PACU Anesthesia Type: General Level of consciousness: awake and alert Pain management: pain level controlled Vital Signs Assessment: post-procedure vital signs reviewed and stable Respiratory status: spontaneous breathing, nonlabored ventilation, respiratory function stable and patient connected to nasal cannula oxygen Cardiovascular status: blood pressure returned to baseline and stable Postop Assessment: no apparent nausea or vomiting Anesthetic complications: no    Last Vitals:  Vitals:   10/21/18 1220 10/21/18 1230  BP: (!) 150/73 (!) 173/62  Pulse:  (!) 42  Resp: 20 14  Temp:    SpO2:  98%    Last Pain:  Vitals:   10/21/18 1215  TempSrc:   PainSc: Asleep                 Juergen Hardenbrook A.

## 2018-10-21 NOTE — Discharge Instructions (Signed)
INSTRUCTIONS AFTER JOINT REVISION   o Remove items at home which could result in a fall. This includes throw rugs or furniture in walking pathways o ICE to the affected joint every three hours while awake for 30 minutes at a time, for at least the first 3-5 days, and then as needed for pain and swelling.  Continue to use ice for pain and swelling. You may notice swelling that will progress down to the foot and ankle.  This is normal after surgery.  Elevate your leg when you are not up walking on it.   o Continue to use the breathing machine you got in the hospital (incentive spirometer) which will help keep your temperature down.  It is common for your temperature to cycle up and down following surgery, especially at night when you are not up moving around and exerting yourself.  The breathing machine keeps your lungs expanded and your temperature down.   DIET:  As you were doing prior to hospitalization, we recommend a well-balanced diet.  DRESSING / WOUND CARE / SHOWERING  Keep the surgical dressing until follow up.  The dressing is water proof, so you can shower without any extra covering.  IF THE DRESSING FALLS OFF or the wound gets wet inside, change the dressing with sterile gauze.  Please use good hand washing techniques before changing the dressing.  Do not use any lotions or creams on the incision until instructed by your surgeon.    ACTIVITY  o Increase activity slowly as tolerated, but follow the weight bearing instructions below.   o No driving for 6 weeks or until further direction given by your physician.  You cannot drive while taking narcotics.  o No lifting or carrying greater than 10 lbs. until further directed by your surgeon. o Avoid periods of inactivity such as sitting longer than an hour when not asleep. This helps prevent blood clots.  o You may return to work once you are authorized by your doctor.     WEIGHT BEARING   Weight bearing as tolerated with assist device  (walker, cane, etc) as directed, use it as long as suggested by your surgeon or therapist, typically at least 4-6 weeks.   EXERCISES  Results after joint replacement surgery are often greatly improved when you follow the exercise, range of motion and muscle strengthening exercises prescribed by your doctor. Safety measures are also important to protect the joint from further injury. Any time any of these exercises cause you to have increased pain or swelling, decrease what you are doing until you are comfortable again and then slowly increase them. If you have problems or questions, call your caregiver or physical therapist for advice.   Rehabilitation is important following a joint replacement. After just a few days of immobilization, the muscles of the leg can become weakened and shrink (atrophy).  These exercises are designed to build up the tone and strength of the thigh and leg muscles and to improve motion. Often times heat used for twenty to thirty minutes before working out will loosen up your tissues and help with improving the range of motion but do not use heat for the first two weeks following surgery (sometimes heat can increase post-operative swelling).   These exercises can be done on a training (exercise) mat, on the floor, on a table or on a bed. Use whatever works the best and is most comfortable for you.    Use music or television while you are exercising so that  the exercises are a pleasant break in your day. This will make your life better with the exercises acting as a break in your routine that you can look forward to.   Perform all exercises about fifteen times, three times per day or as directed.  You should exercise both the operative leg and the other leg as well.  Exercises include:    Quad Sets - Tighten up the muscle on the front of the thigh (Quad) and hold for 5-10 seconds.    Straight Leg Raises - With your knee straight (if you were given a brace, keep it on), lift  the leg to 60 degrees, hold for 3 seconds, and slowly lower the leg.  Perform this exercise against resistance later as your leg gets stronger.   Leg Slides: Lying on your back, slowly slide your foot toward your buttocks, bending your knee up off the floor (only go as far as is comfortable). Then slowly slide your foot back down until your leg is flat on the floor again.   Angel Wings: Lying on your back spread your legs to the side as far apart as you can without causing discomfort.   Hamstring Strength:  Lying on your back, push your heel against the floor with your leg straight by tightening up the muscles of your buttocks.  Repeat, but this time bend your knee to a comfortable angle, and push your heel against the floor.  You may put a pillow under the heel to make it more comfortable if necessary.   A rehabilitation program following joint replacement surgery can speed recovery and prevent re-injury in the future due to weakened muscles. Contact your doctor or a physical therapist for more information on knee rehabilitation.    CONSTIPATION  Constipation is defined medically as fewer than three stools per week and severe constipation as less than one stool per week.  Even if you have a regular bowel pattern at home, your normal regimen is likely to be disrupted due to multiple reasons following surgery.  Combination of anesthesia, postoperative narcotics, change in appetite and fluid intake all can affect your bowels.   YOU MUST use at least one of the following options; they are listed in order of increasing strength to get the job done.  They are all available over the counter, and you may need to use some, POSSIBLY even all of these options:    Drink plenty of fluids (prune juice may be helpful) and high fiber foods Colace 100 mg by mouth twice a day  Senokot for constipation as directed and as needed Dulcolax (bisacodyl), take with full glass of water  Miralax (polyethylene glycol) once  or twice a day as needed.  If you have tried all these things and are unable to have a bowel movement in the first 3-4 days after surgery call either your surgeon or your primary doctor.    If you experience loose stools or diarrhea, hold the medications until you stool forms back up.  If your symptoms do not get better within 1 week or if they get worse, check with your doctor.  If you experience "the worst abdominal pain ever" or develop nausea or vomiting, please contact the office immediately for further recommendations for treatment.   ITCHING:  If you experience itching with your medications, try taking only a single pain pill, or even half a pain pill at a time.  You can also use Benadryl over the counter for itching or also to  help with sleep.   TED HOSE STOCKINGS:  Use stockings on both legs until for at least 2 weeks or as directed by physician office. They may be removed at night for sleeping.  MEDICATIONS:  See your medication summary on the After Visit Summary that nursing will review with you.  You may have some home medications which will be placed on hold until you complete the course of blood thinner medication.  It is important for you to complete the blood thinner medication as prescribed.  PRECAUTIONS:  If you experience chest pain or shortness of breath - call 911 immediately for transfer to the hospital emergency department.   If you develop a fever greater that 101 F, purulent drainage from wound, increased redness or drainage from wound, foul odor from the wound/dressing, or calf pain - CONTACT YOUR SURGEON.                                                   FOLLOW-UP APPOINTMENTS:  If you do not already have a post-op appointment, please call the office for an appointment to be seen by your surgeon.  Guidelines for how soon to be seen are listed in your After Visit Summary, but are typically between 1-4 weeks after surgery.  OTHER INSTRUCTIONS:    MAKE SURE YOU:    Understand these instructions.   Get help right away if you are not doing well or get worse.    Thank you for letting us be a part of your medical care team.  It is a privilege we respect greatly.  We hope these instructions will help you stay on track for a fast and full recovery!

## 2018-10-21 NOTE — Progress Notes (Signed)
Pt c/o 10/10 pain to left hip unrelieved by current pain medications. Notified on-call provider for Dr. Stann Mainland. Received call back & new orders from [facility]. D/C oxycodone & add PO Dilaudid 2-4mg  Q4 hrs PRN for moderate to severe pain.

## 2018-10-21 NOTE — Op Note (Signed)
NAME: DMARIUS, REEDER MEDICAL RECORD AY:[id number] ACCOUNT [id number] DATE OF BIRTH:[birth year] FACILITY: [facility] LOCATION: [facility] PHYSICIAN:Giliana Vantil D. 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  PROCEDURE:  Revision left hip replacement to a constrained liner with a 54 outer diameter liner, a +10 degree constrained liner with a 32+9 metal ball.  SURGEON:  [name], MD  ASSISTANT:  Griffith Citron, PA-C.  Note that Ms. Nehemiah Settle was present for the entirety of the case from preoperative positioning, perioperative management of the operative extremity, general facilitation of case and primary wound closure.  ANESTHESIA:  General.  SPECIMENS:  None.  COMPLICATIONS:  None.  ESTIMATED BLOOD LOSS:  About 600 mL.  DRAINS:  None.  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  Initially, he became a patient of mine after femoral neck fracture.  He  underwent a left hip hemiarthroplasty that remained stable without dislocation.  However, he had persistent pain.  Despite discussing the risks of recurrent dislocation related to adductor contracture, he wished to proceed with total hip replacement to  try to alleviate pain.  The total hip replacement was performed.  He had initially done well but has now had 3 different dislocations, 2 requiring closed reduction.  His most recent 3rd dislocation happened 2 days prior to admission.  His acetabular  component has been noted to be stable as well as the purposeful  lengthening to try to add tension and prevent dislocation.  Despite this, however, he has had dislocations.  At this point, I felt it was in his best interest, based on previous orientation  of the components and evaluation, to proceed with a constrained liner.  At this point, no soft tissue procedure is planned, including the potential for releasing his adductor tendons.  Consent was obtained for benefit of pain relief and an attempt to  reduce the chance of it dislocating again.  PROCEDURE IN DETAIL:  The patient was brought to the operative theater.  Once adequate anesthesia, preoperative antibiotics, and Ancef were administered, the patient was positioned in the right lateral decubitus position with the left hip up.  The left  lower extremity was then prepped and draped in sterile fashion.  A timeout was performed identifying the patient, the planned procedure, and extremity.  His old incision had been identified.  The incision was incised.  Soft tissue planes were created  down to the iliotibial band and gluteal fascia.  Upon incising this, we encountered a large seroma, which was evacuated.  As noted, he was dislocated superiorly.  At this point, I removed the femoral head.  We opened up the trial so I could assess the  acetabulum itself.  I reduced the hip with a 36+1.5 ball.  I knew the combined anteversion was greater than 50 degrees.  There was very little ability to extend his hip with significant hip flexion contracture as well as noted adductor contracture.   Given these findings, I went ahead and placed retractors anteriorly, removed osteophyte and scar in the anterior aspect of the joint as was visualized.  I then removed the acetabular liner.  We selected the 54, 32, 10-degree +4 face-changing constrained  liner. I impacted it with the lipped portion of this at approximately 2-3 o'clock for this left hip.  Once this was done, I selected a 32+9 ball.  The ball was impacted on a clean and  dried trunnion.  The hip did take significant effort to reduce, even  in an open fashion, requiring significant hip flexion and internal rotation to come underneath the cup.  The hip was reduced and then reduced further into the constrained liner with a palpable and audible reduction.  The ring of the constrained liner was  then slid over the acetabular liner and placed into appropriate position.  Once this was done, the hip was irrigated.  We used pulse lavage normal saline solution and approximately 1500 mL of it.  There was no posterior capsule repair.  The iliotibial  band and gluteal fascia were reapproximated using #1 Vicryl and Stratafix suture.  The remainder of the wound was closed with 2-0 Vicryl and a running Monocryl stitch.  The hip was cleaned, dried, and dressed sterilely using surgical glue and an Aquacel  dressing.  The patient was brought to the recovery room in stable condition, tolerating the procedure well.  He will be weightbearing as tolerated.  Posterior hip precautions will be reviewed and discharged to home once stable.  LN/NUANCE  D:10/21/2018 T:10/21/2018 JOB:[id number]

## 2018-10-21 NOTE — Progress Notes (Signed)
Patient ID: [name], male   DOB: 07-03-1940, 78 y.o.   MRN: [id number] Subjective:    Patient reports pain as moderate to severe worried about post pain control and his chronic pain med use  Objective:   VITALS:   Vitals:   10/20/18 2121 10/21/18 0605  BP: (!) 146/35 (!) 156/53  Pulse: (!) 52 (!) 55  Resp: 16 18  Temp: 98.8 F (37.1 C) 98.2 F (36.8 C)  SpO2: 99% 100%    Neurovascular intact  LABS Recent Labs    10/20/18 0029  HGB 10.3*  HCT 33.8*  WBC 6.4  PLT 248    Recent Labs    10/20/18 0029  CREATININE 1.99*    No results for input(s): LABPT, INR in the last 72 hours.   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

## 2018-10-21 NOTE — Brief Op Note (Signed)
10/19/2018 - 10/21/2018  10:21 AM  PATIENT:  [name]  78 y.o. male  PRE-OPERATIVE DIAGNOSIS:  Recurrent instability left total hip replacement  POST-OPERATIVE DIAGNOSIS:  Recurrent instability left total hip replacement  PROCEDURE:  Procedure(s): POSTERIOR REVISION HIP WITH POSSIBLE CONSTRAINED LINER (Left)  SURGEON:  Surgeon(s) and Role:    [name], MD - Primary  PHYSICIAN ASSISTANT: Griffith Citron, PA-C  ANESTHESIA:   general  EBL:  600 cc  BLOOD ADMINISTERED:1 unit PRBCs  DRAINS: none   LOCAL MEDICATIONS USED:  NONE  SPECIMEN:  No Specimen  DISPOSITION OF SPECIMEN:  N/A  COUNTS:  YES  TOURNIQUET:  * No tourniquets in log *  DICTATION: .Other Dictation: Dictation Number [id number]  PLAN OF CARE: Admit to inpatient   PATIENT DISPOSITION:  PACU - hemodynamically stable.   Delay start of Pharmacological VTE agent (>24hrs) due to surgical blood loss or risk of bleeding: no

## 2018-10-22 LAB — BASIC METABOLIC PANEL
Anion gap: 5 (ref 5–15)
BUN: 36 mg/dL — ABNORMAL HIGH (ref 8–23)
CO2: 23 mmol/L (ref 22–32)
Calcium: 8.2 mg/dL — ABNORMAL LOW (ref 8.9–10.3)
Chloride: 110 mmol/L (ref 98–111)
Creatinine, Ser: 1.05 mg/dL (ref 0.61–1.24)
GFR calc Af Amer: >60 mL/min (ref >60)
GFR calc non Af Amer: >60 mL/min (ref >60)
Glucose, Bld: 139 mg/dL — ABNORMAL HIGH (ref 70–99)
Potassium: 4.8 mmol/L (ref 3.5–5.1)
Sodium: 138 mmol/L (ref 135–145)

## 2018-10-22 LAB — CBC
HCT: 31.1 % — ABNORMAL LOW (ref 39.0–52.0)
Hemoglobin: 9.7 g/dL — ABNORMAL LOW (ref 13.0–17.0)
MCH: 28.6 pg (ref 26.0–34.0)
MCHC: 31.2 g/dL (ref 30.0–36.0)
MCV: 91.7 fL (ref 80.0–100.0)
Platelets: 179 10*3/uL (ref 150–400)
RBC: 3.39 MIL/uL — ABNORMAL LOW (ref 4.22–5.81)
RDW: 15.9 % — ABNORMAL HIGH (ref 11.5–15.5)
WBC: 10.7 10*3/uL — ABNORMAL HIGH (ref 4.0–10.5)
nRBC: 0 % (ref 0.0–0.2)

## 2018-10-22 NOTE — Progress Notes (Signed)
Physical Therapy Treatment Patient Details Name: [name] MRN: [id number] DOB: October 02, 1940 Today's Date: 10/22/2018    History of Present Illness Pt s/p L THR revision with constrained liner placement following multiple dislocations.  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  Pt demonstrating marked improvement in stability and ability to control WB to tolerance on L LE with use of single rail and crutch.  Written instruction provided.   Follow Up Recommendations  Follow surgeon's recommendation for DC plan and follow-up therapies     Equipment Recommendations  None recommended by PT    Recommendations for Other Services       Precautions / Restrictions Precautions Precautions: Posterior Hip;Fall Precaution Booklet Issued: Yes (comment) Precaution Comments: Ltd endurance 2* COPD; Pt recalls all THP with increaed time Restrictions Weight Bearing Restrictions: No LLE Weight Bearing: Weight bearing as tolerated    Mobility  Bed Mobility               General bed mobility comments: NT - Pt on EOB on arrival and requests back to same to eat lunch.  Pt states he lay down earlier and self assisted back to EOB  Transfers Overall transfer level: Needs assistance Equipment used: Rolling walker (2 wheeled) Transfers: Sit to/from Stand Sit to Stand: Min guard;Supervision         General transfer comment: cues for LE management, use of UEs to self assist and adherence to THP  Ambulation/Gait Ambulation/Gait assistance: Min guard;Supervision Gait Distance (Feet): 30 Feet Assistive device: Rolling walker (2 wheeled) Gait Pattern/deviations: Step-to pattern;Decreased step length - right;Decreased step length - left;Shuffle;Trunk flexed Gait velocity: decr - LTD by SOB   General Gait Details: cues for posture, position  from RW and sequence   Stairs Stairs: Yes Stairs assistance: Min assist Stair Management: One rail Left;Step to pattern;Forwards;With crutches Number of Stairs: 4 General stair comments: 2 steps twice with rail and crutch; cues for sequence and foot/crutch placement; Pt with marked improvement in stability and ability to limit WB on L LE to comfortable tolerance.  Written instruction provided   Wheelchair Mobility    Modified Rankin (Stroke Patients Only)       Balance Overall balance assessment: Needs assistance Sitting-balance support: No upper extremity supported;Feet supported Sitting balance-Leahy Scale: Good     Standing balance support: Bilateral upper extremity supported Standing balance-Leahy Scale: Fair                              Cognition Arousal/Alertness: Awake/alert Behavior During Therapy: WFL for tasks assessed/performed;Impulsive Overall Cognitive Status: Within Functional Limits for tasks assessed                                        Exercises      General Comments        Pertinent Vitals/Pain Pain Assessment: 0-10 Pain Score: 5  Pain Location: L hip Pain Descriptors / Indicators: Aching;Sore Pain Intervention(s): Limited activity within patient's tolerance;Monitored during session;Premedicated before session    [place] expects to be discharged to:: Private residence Living Arrangements: Alone Available Help at Discharge: Available PRN/intermittently;Family Type of Home: Mobile home Home Access: Stairs to enter  Entrance Stairs-Rails: Can reach both Home Layout: One level Home Equipment: Walker - standard Additional Comments: unsure if he still has a 3:1 commode. He has a walk in shower at home.  Has a long shoehorn and adapted shoes    Prior Function Level of Independence: Independent with assistive device(s)          PT Goals (current goals can now be found in the care plan section)  Acute Rehab PT Goals Patient Stated Goal: Regain IND PT Goal Formulation: With patient Time For Goal Achievement: 10/29/18 Potential to Achieve Goals: Good Progress towards PT goals: Progressing toward goals    Frequency    7X/week      PT Plan Current plan remains appropriate    Co-evaluation              AM-PAC PT "6 Clicks" Mobility   Outcome Measure  Help needed turning from your back to your side while in a flat bed without using bedrails?: A Little Help needed moving from lying on your back to sitting on the side of a flat bed without using bedrails?: A Little Help needed moving to and from a bed to a chair (including a wheelchair)?: A Little Help needed standing up from a chair using your arms (e.g., wheelchair or bedside chair)?: A Little Help needed to walk in hospital room?: A Little Help needed climbing 3-5 steps with a railing? : A Little 6 Click Score: 18    End of Session Equipment Utilized During Treatment: Gait belt Activity Tolerance: Patient limited by fatigue Patient left: Other (comment)(EOB) Nurse Communication: Mobility status PT Visit Diagnosis: Difficulty in walking, not elsewhere classified (R26.2)     Time: 8329-1916 PT Time Calculation (min) (ACUTE ONLY): 19 min  Charges:  $Gait Training: 8-22 mins                     [place] Pager [phone] Office [phone]    Aldina Porta 10/22/2018, 1:59 PM

## 2018-10-22 NOTE — Plan of Care (Signed)
resovled 

## 2018-10-22 NOTE — Progress Notes (Signed)
Physical Therapy Treatment Patient Details Name: [name] MRN: [id number] DOB: [birth year] Today's Date: 10/22/2018    History of Present Illness Pt s/p L THR revision with constrained liner placement following multiple dislocations.  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  Pt negotiated 2 steps utilizing both hands on single L rail (pt method at home) - L hip with poor tolerance for increased WB.  Will return after lunch to educate on alternative method for stairs.   Follow Up Recommendations  Follow surgeon's recommendation for DC plan and follow-up therapies     Equipment Recommendations  None recommended by PT    Recommendations for Other Services       Precautions / Restrictions Precautions Precautions: Posterior Hip;Fall Precaution Booklet Issued: Yes (comment) Precaution Comments: Ltd endurance 2* COPD; Pt recalls all THP with increaed time Restrictions Weight Bearing Restrictions: No LLE Weight Bearing: Weight bearing as tolerated    Mobility  Bed Mobility               General bed mobility comments: NT - Pt on EOB on arrival and requests back to same to eat lunch.  Pt states he lay down earlier and self assisted back to EOB  Transfers Overall transfer level: Needs assistance Equipment used: Rolling walker (2 wheeled) Transfers: Sit to/from Stand Sit to Stand: Min guard         General transfer comment: cues for LE management, use of UEs to self assist and adherence to THP  Ambulation/Gait Ambulation/Gait assistance: Min guard Gait Distance (Feet): 50 Feet Assistive device: Rolling walker (2 wheeled) Gait Pattern/deviations: Step-to pattern;Decreased step length - right;Decreased step length - left;Shuffle;Trunk flexed Gait velocity: decr - LTD by SOB   General Gait Details: cues for  posture, position from RW and sequence   Stairs Stairs: Yes Stairs assistance: Min assist;Mod assist Stair Management: One rail Left;Step to pattern;Forwards Number of Stairs: 2 General stair comments: Pt utilizing single rail with both hands on rail and side stepping.  L hip with poor tolerance for increased WB   Wheelchair Mobility    Modified Rankin (Stroke Patients Only)       Balance Overall balance assessment: Needs assistance Sitting-balance support: No upper extremity supported;Feet supported Sitting balance-Leahy Scale: Good     Standing balance support: Bilateral upper extremity supported Standing balance-Leahy Scale: Fair                              Cognition Arousal/Alertness: Awake/alert Behavior During Therapy: WFL for tasks assessed/performed;Impulsive Overall Cognitive Status: Within Functional Limits for tasks assessed                                        Exercises      General Comments        Pertinent Vitals/Pain Pain Assessment: 0-10 Pain Score: 5  Pain Location: L hip Pain Descriptors / Indicators: Aching;Sore Pain Intervention(s): Limited activity within patient's tolerance;Monitored during session;Premedicated before session    [name] expects to be discharged to:: Private residence Living Arrangements: Alone Available Help at Discharge: Available PRN/intermittently;Family Type of Home: Mobile home Home Access: Stairs to enter Entrance Stairs-Rails: Can  reach both Home Layout: One level Home Equipment: Walker - standard Additional Comments: unsure if he still has a 3:1 commode. He has a walk in shower at home.  Has a long shoehorn and adapted shoes    Prior Function Level of Independence: Independent with assistive device(s)          PT Goals (current goals can now be found in the care plan section) Acute Rehab PT Goals Patient Stated Goal: Regain IND PT Goal Formulation: With  patient Time For Goal Achievement: 10/29/18 Potential to Achieve Goals: Good Progress towards PT goals: Progressing toward goals    Frequency    7X/week      PT Plan Current plan remains appropriate    Co-evaluation              AM-PAC PT "6 Clicks" Mobility   Outcome Measure  Help needed turning from your back to your side while in a flat bed without using bedrails?: A Little Help needed moving from lying on your back to sitting on the side of a flat bed without using bedrails?: A Little Help needed moving to and from a bed to a chair (including a wheelchair)?: A Little Help needed standing up from a chair using your arms (e.g., wheelchair or bedside chair)?: A Little Help needed to walk in hospital room?: A Little Help needed climbing 3-5 steps with a railing? : A Lot 6 Click Score: 17    End of Session Equipment Utilized During Treatment: Gait belt Activity Tolerance: Patient limited by fatigue Patient left: Other (comment)(EOB) Nurse Communication: Mobility status PT Visit Diagnosis: Difficulty in walking, not elsewhere classified (R26.2)     Time: 7124-5809 PT Time Calculation (min) (ACUTE ONLY): 22 min  Charges:  $Gait Training: 8-22 mins                     [place] Pager [phone] Office [phone]    Kagan Mutchler 10/22/2018, 1:50 PM

## 2018-10-22 NOTE — Progress Notes (Signed)
Patient ID: [name], male   DOB: Aug 11, 1940, 78 y.o.   MRN: [id number] Subjective: 1 Day Post-Op Procedure(s) (LRB): POSTERIOR REVISION HIP WITH POSSIBLE CONSTRAINED LINER (Left)    Patient reports pain as moderate.  Doing ok this am sitting at bedside waiting for his breakfast.  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   Assessment/Plan: 1 Day Post-Op Procedure(s) (LRB): POSTERIOR REVISION HIP WITH POSSIBLE CONSTRAINED LINER (Left)   Up with therapy  Home today after therapy review of precautions  RTC in 2 weeks

## 2018-10-22 NOTE — Evaluation (Signed)
Physical Therapy Evaluation Patient Details Name: [name] MRN: [id number] DOB: June 10, 1940 Today's Date: 10/22/2018   History of Present Illness  Pt s/p L THR revision with constrained liner placement following multiple dislocations.  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  Pt should progress to dc home with assist of family.    Follow Up Recommendations Follow surgeon's recommendation for DC plan and follow-up therapies    Equipment Recommendations  Other (comment)(TBD)    Recommendations for Other Services       Precautions / Restrictions Precautions Precautions: Posterior Hip;Fall Precaution Comments: Ltd endurance 2* COPD Restrictions Weight Bearing Restrictions: No LLE Weight Bearing: Weight bearing as tolerated      Mobility  Bed Mobility               General bed mobility comments: NT - Pt on EOB on arrival and requests back to same  Transfers Overall transfer level: Needs assistance   Transfers: Sit to/from Stand Sit to Stand: Min guard         General transfer comment: cues for LE management, use of UEs to self assist and adherence to THP  Ambulation/Gait Ambulation/Gait assistance: Min guard Gait Distance (Feet): 44 Feet Assistive device: Rolling walker (2 wheeled) Gait Pattern/deviations: Step-to pattern;Decreased step length - right;Decreased step length - left;Shuffle;Trunk flexed Gait velocity: decr - LTD by SOB   General Gait Details: cues for posture, position from RW and sequence  Stairs            Wheelchair Mobility    Modified Rankin (Stroke Patients Only)       Balance Overall balance assessment: Needs assistance Sitting-balance support: No upper  extremity supported;Feet supported Sitting balance-Leahy Scale: Good     Standing balance support: Bilateral upper extremity supported Standing balance-Leahy Scale: Fair                               Pertinent Vitals/Pain Pain Assessment: 0-10 Pain Score: 5  Pain Location: L hip Pain Descriptors / Indicators: Aching;Sore Pain Intervention(s): Limited activity within patient's tolerance;Monitored during session;Premedicated before session;Ice applied    Home Living Family/patient expects to be discharged to:: Private residence Living Arrangements: Alone Available Help at Discharge: Available PRN/intermittently;Family Type of Home: Mobile home Home Access: Stairs to enter Entrance Stairs-Rails: Can reach both Entrance Stairs-Number of Steps: 3 Home Layout: One level Home Equipment: Walker - standard Additional Comments: unsure if he still has a 3:1 commode. He has a walk in shower at home.  Has a long shoehorn and adapted shoes    Prior Function Level of Independence: Independent with assistive device(s)               Hand Dominance   Dominant Hand: Right    Extremity/Trunk Assessment   Upper Extremity Assessment Upper Extremity Assessment: Overall WFL for tasks assessed    Lower Extremity Assessment Lower Extremity Assessment: LLE deficits/detail    Cervical / Trunk Assessment Cervical / Trunk Assessment: Kyphotic  Communication   Communication: No difficulties  Cognition Arousal/Alertness: Awake/alert Behavior During Therapy: WFL for tasks assessed/performed;Impulsive Overall Cognitive Status: Within Functional Limits for tasks assessed  General Comments      Exercises     Assessment/Plan    PT Assessment Patient needs continued PT services  PT Problem List Decreased strength;Decreased range of motion;Decreased activity tolerance;Decreased balance;Decreased mobility;Decreased  knowledge of use of DME;Decreased knowledge of precautions;Pain       PT Treatment Interventions DME instruction;Gait training;Stair training;Functional mobility training;Therapeutic activities;Therapeutic exercise;Patient/family education    PT Goals (Current goals can be found in the Care Plan section)  Acute Rehab PT Goals Patient Stated Goal: Regain IND PT Goal Formulation: With patient Time For Goal Achievement: 10/29/18 Potential to Achieve Goals: Good    Frequency 7X/week   Barriers to discharge        Co-evaluation               AM-PAC PT "6 Clicks" Mobility  Outcome Measure Help needed turning from your back to your side while in a flat bed without using bedrails?: A Little Help needed moving from lying on your back to sitting on the side of a flat bed without using bedrails?: A Little Help needed moving to and from a bed to a chair (including a wheelchair)?: A Little Help needed standing up from a chair using your arms (e.g., wheelchair or bedside chair)?: A Little Help needed to walk in hospital room?: A Little Help needed climbing 3-5 steps with a railing? : A Little 6 Click Score: 18    End of Session Equipment Utilized During Treatment: Gait belt Activity Tolerance: Patient limited by fatigue Patient left: Other (comment)(sitting EOB) Nurse Communication: Mobility status PT Visit Diagnosis: Difficulty in walking, not elsewhere classified (R26.2)    Time: 3818-2993 PT Time Calculation (min) (ACUTE ONLY): 31 min   Charges:   PT Evaluation $PT Eval Low Complexity: 1 Low PT Treatments $Gait Training: 8-22 mins        [place] Pager [phone] Office [phone]   Lynia Landry 10/22/2018, 11:18 AM

## 2018-10-22 NOTE — Care Management Important Message (Signed)
Important Message  Patient Details  Name: [name] MRN: [id number] Date of Birth: [birth year]   Medicare Important Message Given:  Yes. CMA printed out patient's IM for the CSW or Case Manager Nurse to give to the patient.      Christell Steinmiller 10/22/2018, 8:10 AM

## 2018-10-23 LAB — BPAM RBC
Blood Product Expiration Date: 202007232359
Blood Product Expiration Date: 202007252359
ISSUE DATE / TIME: 202007021037
ISSUE DATE / TIME: 202007021037
Unit Type and Rh: 600
Unit Type and Rh: 600

## 2018-10-23 LAB — TYPE AND SCREEN
ABO/RH(D): A NEG
Antibody Screen: NEGATIVE
Unit division: 0
Unit division: 0

## 2018-10-25 ENCOUNTER — Encounter (HOSPITAL_COMMUNITY): Payer: Self-pay | Admitting: Orthopedic Surgery

## 2018-10-25 ENCOUNTER — Telehealth: Payer: Self-pay | Admitting: *Deleted

## 2018-10-25 NOTE — Telephone Encounter (Signed)
Pt was on the patient ping portal admitted 10/19/18 for Left hip instability. Pt have a left hip dislocation following a forward flexion. He was seen in the ED with attempted CR by EDP which was unsuccessful. He has now three dislocations in 3 months. Pt underwent a  POSTERIOR REVISION HIP WITH POSSIBLE CONSTRAINED LINER.

## 2018-10-25 NOTE — Telephone Encounter (Signed)
Pt D/C 10/22/18, and will follow=up w/surgeon [name], MD (Orthopedic Surgery) in 2 weeks (11/05/2018).Marland KitchenJohny Mcdowell

## 2018-10-26 NOTE — Discharge Summary (Signed)
Physician Discharge Summary   Patient ID: [name] MRN: [id number] DOB/AGE: 08/14/1940 78 y.o.  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  Admission Diagnoses:  Past Medical History:  Diagnosis Date  . AAA (abdominal aortic aneurysm) ([place])   . Blindness of left eye   . 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 CAD (coronary artery disease)    a. s/p CABG in 10/2008 with LIMA-LAD, SVG-OM1 with Y-graft to PL branch, and SVG-RCA  . Carotid stenosis   . Cerebrovascular disease   . CHF (congestive heart failure) ([place])   . Cirrhosis ([place])    on CT a/p 07/2013, no longer drinking  . CONGENITAL UNSPEC REDUCTION DEFORMITY LOWER LIMB   . Constipation due to opioid therapy 2014   began about a year ago  . COPD (chronic obstructive pulmonary disease) ([place])   . HTN (hypertension)   . Hyperlipidemia   . 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 TOBACCO USE, QUIT    Discharge Diagnoses:   Active Problems:   Hip instability, left   S/P left THA revision, posterior  Estimated body mass index is 24.68 kg/m as calculated from the following:   Height as of this encounter: 5\' 10"  (1.778 m).   Weight as of this encounter: 78 kg.  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  Initially, he became a patient of mine after femoral neck fracture.  He  underwent a left hip hemiarthroplasty that remained stable without dislocation.  However, he had persistent pain.  Despite discussing the risks of recurrent dislocation related to adductor  contracture, he wished to proceed with total hip replacement to try to alleviate pain.  The total hip replacement was performed.  He had initially done well but has now had 3 different dislocations, 2 requiring closed reduction.  His most recent 3rd dislocation happened 2 days prior to admission.  His acetabular component has been noted to be stable as well as the purposeful lengthening to try to add tension and prevent dislocation.  Despite this, however, he has had dislocations.  At this point, I felt it was in his best interest, based on previous orientation of the components and evaluation, to proceed with a constrained liner.  At this point, no soft tissue procedure is planned, including the potential for releasing his adductor tendons.  Consent was obtained for benefit of pain relief and an attempt to reduce the chance of it dislocating again.  Laboratory Data: Admission on 10/19/2018, Discharged on 10/22/2018  Component Date Value Ref Range Status  . SARS Coronavirus 2 10/19/2018 NEGATIVE  NEGATIVE Final   Comment: (NOTE) If result is NEGATIVE SARS-CoV-2 target nucleic acids are NOT DETECTED. The SARS-CoV-2 RNA is generally detectable in upper and lower  respiratory specimens during the acute phase of infection. The lowest  concentration of SARS-CoV-2 viral copies this assay can detect is 250  copies / mL. A negative result does not preclude SARS-CoV-2 infection  and should not be used as the sole basis for treatment or other  patient management decisions.  A negative result may occur with  improper specimen collection / handling,  submission of specimen other  than nasopharyngeal swab, presence of viral mutation(s) within the  areas targeted by this assay, and inadequate number of viral copies  (<250 copies / mL). A negative result must be combined with clinical  observations, patient history, and epidemiological information. If result is POSITIVE SARS-CoV-2 target nucleic acids are  DETECTED. The SARS-CoV-2 RNA is generally detectable in upper and lower  respiratory specimens dur                          ing the acute phase of infection.  Positive  results are indicative of active infection with SARS-CoV-2.  Clinical  correlation with patient history and other diagnostic information is  necessary to determine patient infection status.  Positive results do  not rule out bacterial infection or co-infection with other viruses. If result is PRESUMPTIVE POSTIVE SARS-CoV-2 nucleic acids MAY BE PRESENT.   A presumptive positive result was obtained on the submitted specimen  and confirmed on repeat testing.  While 2019 novel coronavirus  (SARS-CoV-2) nucleic acids may be present in the submitted sample  additional confirmatory testing may be necessary for epidemiological  and / or clinical management purposes  to differentiate between  SARS-CoV-2 and other Sarbecovirus currently known to infect humans.  If clinically indicated additional testing with an alternate test  methodology [phone]) is advised. The SARS-CoV-2 RNA is generally  detectable in upper and lower respiratory sp                          ecimens during the acute  phase of infection. The expected result is Negative. Fact Sheet for Patients:  StrictlyIdeas.no Fact Sheet for Healthcare Providers: BankingDealers.co.za This test is not yet approved or cleared by the [place] FDA and has been authorized for detection and/or diagnosis of SARS-CoV-2 by FDA under an Emergency Use Authorization (EUA).  This EUA will remain in effect (meaning this test can be used) for the duration of the COVID-19 declaration under Section 564(b)(1) of the Act, 21 U.S.C. section 360bbb-3(b)(1), unless the authorization is terminated or revoked sooner. Performed at [facility], [place] [address] [place], [place] [address]   . WBC 10/20/2018 6.4  4.0 - 10.5 K/uL  Final  . RBC 10/20/2018 3.69* 4.22 - 5.81 MIL/uL Final  . Hemoglobin 10/20/2018 10.3* 13.0 - 17.0 g/dL Final  . HCT 10/20/2018 33.8* 39.0 - 52.0 % Final  . MCV 10/20/2018 91.6  80.0 - 100.0 fL Final  . MCH 10/20/2018 27.9  26.0 - 34.0 pg Final  . MCHC 10/20/2018 30.5  30.0 - 36.0 g/dL Final  . RDW 10/20/2018 16.4* 11.5 - 15.5 % Final  . Platelets 10/20/2018 248  150 - 400 K/uL Final  . nRBC 10/20/2018 0.0  0.0 - 0.2 % Final   Performed at [facility], [place] [address] [place], [place] [address]  . Creatinine, Ser 10/20/2018 1.99* 0.61 - 1.24 mg/dL Final  . GFR calc non Af Amer 10/20/2018 32* >60 mL/min Final  . GFR calc Af Amer 10/20/2018 37* >60 mL/min Final   Performed at [facility], [place] [address] [place], [place] [address]  . Order Confirmation 10/21/2018    Final                   Value:ORDER PROCESSED BY BLOOD BANK Performed at [facility], [place] [address] [place],  [address]   .  ABO/RH(D) 10/21/2018 A NEG   Final  . Antibody Screen 10/21/2018 NEG   Final  . Sample Expiration 10/21/2018 10/24/2018,2359   Final  . Unit Number 10/21/2018 [id number]   Final  . Blood Component Type 10/21/2018 RED CELLS,LR   Final  . Unit division 10/21/2018 00   Final  . Status of Unit 10/21/2018 ISSUED,FINAL   Final  . Transfusion Status 10/21/2018 OK TO TRANSFUSE   Final  . Crossmatch Result 10/21/2018 Compatible   Final  . Unit Number 10/21/2018 [id number]   Final  . Blood Component Type 10/21/2018 RED CELLS,LR   Final  . Unit division 10/21/2018 00   Final  . Status of Unit 10/21/2018 ISSUED,FINAL   Final  . Transfusion Status 10/21/2018 OK TO TRANSFUSE   Final  . Crossmatch Result 10/21/2018    Final                   Value:Compatible Performed at [facility], [place] [address] [place], [place] [address]   . ISSUE DATE / TIME 10/21/2018 [id number]   Final  . Blood Product Unit Number 10/21/2018  [id number]   Final  . PRODUCT CODE 10/21/2018 [id number]   Final  . Unit Type and Rh 10/21/2018 0600   Final  . Blood Product Expiration Date 10/21/2018 [id number]   Final  . ISSUE DATE / TIME 10/21/2018 [id number]   Final  . Blood Product Unit Number 10/21/2018 [id number]   Final  . PRODUCT CODE 10/21/2018 [id number]   Final  . Unit Type and Rh 10/21/2018 0600   Final  . Blood Product Expiration Date 10/21/2018 [id number]   Final  . WBC 10/22/2018 10.7* 4.0 - 10.5 K/uL Final  . RBC 10/22/2018 3.39* 4.22 - 5.81 MIL/uL Final  . Hemoglobin 10/22/2018 9.7* 13.0 - 17.0 g/dL Final  . HCT 10/22/2018 31.1* 39.0 - 52.0 % Final  . MCV 10/22/2018 91.7  80.0 - 100.0 fL Final  . MCH 10/22/2018 28.6  26.0 - 34.0 pg Final  . MCHC 10/22/2018 31.2  30.0 - 36.0 g/dL Final  . RDW 10/22/2018 15.9* 11.5 - 15.5 % Final  . Platelets 10/22/2018 179  150 - 400 K/uL Final  . nRBC 10/22/2018 0.0  0.0 - 0.2 % Final   Performed at [facility], [place] [address] [place], [place] [address]  . Sodium 10/22/2018 138  135 - 145 mmol/L Final  . Potassium 10/22/2018 4.8  3.5 - 5.1 mmol/L Final  . Chloride 10/22/2018 110  98 - 111 mmol/L Final  . CO2 10/22/2018 23  22 - 32 mmol/L Final  . Glucose, Bld 10/22/2018 139* 70 - 99 mg/dL Final  . BUN 10/22/2018 36* 8 - 23 mg/dL Final  . Creatinine, Ser 10/22/2018 1.05  0.61 - 1.24 mg/dL Final  . Calcium 10/22/2018 8.2* 8.9 - 10.3 mg/dL Final  . GFR calc non Af Amer 10/22/2018 >60  >60 mL/min Final  . GFR calc Af Amer 10/22/2018 >60  >60 mL/min Final  . Anion gap 10/22/2018 5  5 - 15 Final   Performed at [facility], [place] [address], [place],  [address]  Admission on 10/08/2018, Discharged on 10/09/2018  Component Date Value Ref Range Status  . Sodium 10/08/2018 138  135 - 145 mmol/L Final  . Potassium 10/08/2018 4.1  3.5 - 5.1 mmol/L Final  . Chloride 10/08/2018 104  98 - 111 mmol/L Final  . CO2 10/08/2018 23  22 - 32  mmol/L Final  .  Glucose, Bld 10/08/2018 168* 70 - 99 mg/dL Final  . BUN 10/08/2018 57* 8 - 23 mg/dL Final  . Creatinine, Ser 10/08/2018 1.64* 0.61 - 1.24 mg/dL Final  . Calcium 10/08/2018 8.7* 8.9 - 10.3 mg/dL Final  . GFR calc non Af Amer 10/08/2018 40* >60 mL/min Final  . GFR calc Af Amer 10/08/2018 47* >60 mL/min Final  . Anion gap 10/08/2018 11  5 - 15 Final   Performed at [facility], [place] [address], [place], [place] [address]  . WBC 10/08/2018 6.2  4.0 - 10.5 K/uL Final  . RBC 10/08/2018 3.69* 4.22 - 5.81 MIL/uL Final  . Hemoglobin 10/08/2018 10.4* 13.0 - 17.0 g/dL Final  . HCT 10/08/2018 33.1* 39.0 - 52.0 % Final  . MCV 10/08/2018 89.7  80.0 - 100.0 fL Final  . MCH 10/08/2018 28.2  26.0 - 34.0 pg Final  . MCHC 10/08/2018 31.4  30.0 - 36.0 g/dL Final  . RDW 10/08/2018 14.6  11.5 - 15.5 % Final  . Platelets 10/08/2018 275  150 - 400 K/uL Final  . nRBC 10/08/2018 0.0  0.0 - 0.2 % Final  . Neutrophils Relative % 10/08/2018 80  % Final  . Neutro Abs 10/08/2018 5.0  1.7 - 7.7 K/uL Final  . Lymphocytes Relative 10/08/2018 10  % Final  . Lymphs Abs 10/08/2018 0.6* 0.7 - 4.0 K/uL Final  . Monocytes Relative 10/08/2018 7  % Final  . Monocytes Absolute 10/08/2018 0.4  0.1 - 1.0 K/uL Final  . Eosinophils Relative 10/08/2018 2  % Final  . Eosinophils Absolute 10/08/2018 0.1  0.0 - 0.5 K/uL Final  . Basophils Relative 10/08/2018 1  % Final  . Basophils Absolute 10/08/2018 0.0  0.0 - 0.1 K/uL Final  . Immature Granulocytes 10/08/2018 0  % Final  . Abs Immature Granulocytes 10/08/2018 0.02  0.00 - 0.07 K/uL Final   Performed at [facility], [place] [address], [place], [place] [address]  . SARS Coronavirus 2 10/08/2018 NEGATIVE  NEGATIVE Final   Comment: (NOTE) If result is NEGATIVE SARS-CoV-2 target nucleic acids are NOT DETECTED. The SARS-CoV-2 RNA is generally detectable in upper and lower  respiratory specimens during the acute phase of infection.  The lowest  concentration of SARS-CoV-2 viral copies this assay can detect is 250  copies / mL. A negative result does not preclude SARS-CoV-2 infection  and should not be used as the sole basis for treatment or other  patient management decisions.  A negative result may occur with  improper specimen collection / handling, submission of specimen other  than nasopharyngeal swab, presence of viral mutation(s) within the  areas targeted by this assay, and inadequate number of viral copies  (<250 copies / mL). A negative result must be combined with clinical  observations, patient history, and epidemiological information. If result is POSITIVE SARS-CoV-2 target nucleic acids are DETECTED. The SARS-CoV-2 RNA is generally detectable in upper and lower  respiratory specimens dur                          ing the acute phase of infection.  Positive  results are indicative of active infection with SARS-CoV-2.  Clinical  correlation with patient history and other diagnostic information is  necessary to determine patient infection status.  Positive results do  not rule out bacterial infection or co-infection with other viruses. If result is PRESUMPTIVE POSTIVE SARS-CoV-2 nucleic acids MAY BE PRESENT.   A  presumptive positive result was obtained on the submitted specimen  and confirmed on repeat testing.  While 2019 novel coronavirus  (SARS-CoV-2) nucleic acids may be present in the submitted sample  additional confirmatory testing may be necessary for epidemiological  and / or clinical management purposes  to differentiate between  SARS-CoV-2 and other Sarbecovirus currently known to infect humans.  If clinically indicated additional testing with an alternate test  methodology [phone]) is advised. The SARS-CoV-2 RNA is generally  detectable in upper and lower respiratory sp                          ecimens during the acute  phase of infection. The expected result is Negative. Fact Sheet for  Patients:  StrictlyIdeas.no Fact Sheet for Healthcare Providers: BankingDealers.co.za This test is not yet approved or cleared by the [place] FDA and has been authorized for detection and/or diagnosis of SARS-CoV-2 by FDA under an Emergency Use Authorization (EUA).  This EUA will remain in effect (meaning this test can be used) for the duration of the COVID-19 declaration under Section 564(b)(1) of the Act, 21 U.S.C. section 360bbb-3(b)(1), unless the authorization is terminated or revoked sooner. Performed at [facility], [place] [address], [place], [place] [address]   . Glucose-Capillary 10/09/2018 142* 70 - 99 mg/dL Final  . MRSA by PCR 10/09/2018 NEGATIVE  NEGATIVE Final   Comment:        The GeneXpert MRSA Assay (FDA approved for NASAL specimens only), is one component of a comprehensive MRSA colonization surveillance program. It is not intended to diagnose MRSA infection nor to guide or monitor treatment for MRSA infections. Performed at [facility], [place] [address], [place], [place] [address]   Admission on 09/18/2018, Discharged on 09/18/2018  Component Date Value Ref Range Status  . Sodium 09/18/2018 134* 135 - 145 mmol/L Final  . Potassium 09/18/2018 4.5  3.5 - 5.1 mmol/L Final  . Chloride 09/18/2018 103  98 - 111 mmol/L Final  . CO2 09/18/2018 20* 22 - 32 mmol/L Final  . Glucose, Bld 09/18/2018 119* 70 - 99 mg/dL Final  . BUN 09/18/2018 42* 8 - 23 mg/dL Final  . Creatinine, Ser 09/18/2018 1.43* 0.61 - 1.24 mg/dL Final  . Calcium 09/18/2018 8.5* 8.9 - 10.3 mg/dL Final  . GFR calc non Af Amer 09/18/2018 48* >60 mL/min Final  . GFR calc Af Amer 09/18/2018 55* >60 mL/min Final  . Anion gap 09/18/2018 11  5 - 15 Final   Performed at [facility], [place] [address] [place], [place] [address]  . WBC 09/18/2018 7.1  4.0 - 10.5 K/uL Final  . RBC 09/18/2018 3.44* 4.22 -  5.81 MIL/uL Final  . Hemoglobin 09/18/2018 9.7* 13.0 - 17.0 g/dL Final  . HCT 09/18/2018 31.4* 39.0 - 52.0 % Final  . MCV 09/18/2018 91.3  80.0 - 100.0 fL Final  . MCH 09/18/2018 28.2  26.0 - 34.0 pg Final  . MCHC 09/18/2018 30.9  30.0 - 36.0 g/dL Final  . RDW 09/18/2018 15.8* 11.5 - 15.5 % Final  . Platelets 09/18/2018 159  150 - 400 K/uL Final  . nRBC 09/18/2018 0.0  0.0 - 0.2 % Final  . Neutrophils Relative % 09/18/2018 82  % Final  . Neutro Abs 09/18/2018 5.8  1.7 - 7.7 K/uL Final  . Lymphocytes Relative 09/18/2018 9  % Final  . Lymphs Abs 09/18/2018 0.6* 0.7 - 4.0 K/uL Final  .  Monocytes Relative 09/18/2018 7  % Final  . Monocytes Absolute 09/18/2018 0.5  0.1 - 1.0 K/uL Final  . Eosinophils Relative 09/18/2018 2  % Final  . Eosinophils Absolute 09/18/2018 0.1  0.0 - 0.5 K/uL Final  . Basophils Relative 09/18/2018 0  % Final  . Basophils Absolute 09/18/2018 0.0  0.0 - 0.1 K/uL Final  . Immature Granulocytes 09/18/2018 0  % Final  . Abs Immature Granulocytes 09/18/2018 0.02  0.00 - 0.07 K/uL Final   Performed at [facility], [place] [address], [place], [place] [address]  . SARS Coronavirus 2 09/18/2018 NEGATIVE  NEGATIVE Final   Comment: (NOTE) If result is NEGATIVE SARS-CoV-2 target nucleic acids are NOT DETECTED. The SARS-CoV-2 RNA is generally detectable in upper and lower  respiratory specimens during the acute phase of infection. The lowest  concentration of SARS-CoV-2 viral copies this assay can detect is 250  copies / mL. A negative result does not preclude SARS-CoV-2 infection  and should not be used as the sole basis for treatment or other  patient management decisions.  A negative result may occur with  improper specimen collection / handling, submission of specimen other  than nasopharyngeal swab, presence of viral mutation(s) within the  areas targeted by this assay, and inadequate number of viral copies  (<250 copies / mL). A negative result must be  combined with clinical  observations, patient history, and epidemiological information. If result is POSITIVE SARS-CoV-2 target nucleic acids are DETECTED. The SARS-CoV-2 RNA is generally detectable in upper and lower  respiratory specimens dur                          ing the acute phase of infection.  Positive  results are indicative of active infection with SARS-CoV-2.  Clinical  correlation with patient history and other diagnostic information is  necessary to determine patient infection status.  Positive results do  not rule out bacterial infection or co-infection with other viruses. If result is PRESUMPTIVE POSTIVE SARS-CoV-2 nucleic acids MAY BE PRESENT.   A presumptive positive result was obtained on the submitted specimen  and confirmed on repeat testing.  While 2019 novel coronavirus  (SARS-CoV-2) nucleic acids may be present in the submitted sample  additional confirmatory testing may be necessary for epidemiological  and / or clinical management purposes  to differentiate between  SARS-CoV-2 and other Sarbecovirus currently known to infect humans.  If clinically indicated additional testing with an alternate test  methodology [phone]) is advised. The SARS-CoV-2 RNA is generally  detectable in upper and lower respiratory sp                          ecimens during the acute  phase of infection. The expected result is Negative. Fact Sheet for Patients:  StrictlyIdeas.no Fact Sheet for Healthcare Providers: BankingDealers.co.za This test is not yet approved or cleared by the [place] FDA and has been authorized for detection and/or diagnosis of SARS-CoV-2 by FDA under an Emergency Use Authorization (EUA).  This EUA will remain in effect (meaning this test can be used) for the duration of the COVID-19 declaration under Section 564(b)(1) of the Act, 21 U.S.C. section 360bbb-3(b)(1), unless the authorization is terminated  or revoked sooner. Performed at [facility], [place] [address], [place], [place] [address]      X-Rays:Dg Pelvis 1-2 Views  Result Date: 10/09/2018 CLINICAL DATA:  Status post  closed manipulation of left hip in the operating room. EXAM: PELVIS - 1-2 VIEW COMPARISON:  Radiograph earlier this day. FINDINGS: Femoral head component is been relocated and now is seated in the acetabular component. No visualized fracture. Portions of the iliac wings excluded from the field of view. IMPRESSION: Relocation of prior left hip arthroplasty dislocation. No visualized fracture. Electronically Signed   By: Keith Rake M.D.   On: 10/09/2018 01:01   Dg Pelvis Portable  Result Date: 10/21/2018 CLINICAL DATA:  Status post hip replacement EXAM: PORTABLE PELVIS 1-2 VIEWS COMPARISON:  October 19, 2018 FINDINGS: The patient has undergone revision of the revision of the left hip prosthesis with a constrained ring. There is overlying soft tissue swelling and subcutaneous gas which is likely related to the recent surgical intervention. There is no displaced fracture. No dislocation. There are advanced degenerative changes of the right femoroacetabular joint. IMPRESSION: 1. No acute osseous abnormality. 2. Expected postsurgical changes related to recent revision of the left hip arthroplasty, with overlying soft tissue swelling and subcutaneous gas. 3. There is a new constrained ring involving the left hip prosthesis. Exact positioning of this ring is difficult to determine on a single view technique. Electronically Signed   By: Constance Holster M.D.   On: 10/21/2018 20:25   Dg Hip Port Unilat With Pelvis 1v Left  Result Date: 10/19/2018 CLINICAL DATA:  Dislocated hip EXAM: DG HIP (WITH OR WITHOUT PELVIS) 1V PORT LEFT COMPARISON:  10/08/2018 FINDINGS: There is superior dislocation of the left hip replacement. No visible fracture. 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 Electronically  Signed   By: Rolm Baptise M.D.   On: 10/19/2018 19:04   Dg Hip Unilat With Pelvis 2-3 Views Left  Result Date: 10/08/2018 CLINICAL DATA:  Patient reached picked up something and felt a pop in his hip. EXAM: DG HIP (WITH OR WITHOUT PELVIS) 2-3V LEFT COMPARISON:  None. FINDINGS: There is a dislocation of the patient's LEFT total hip arthroplasty. The ball is displaced superiorly on the cup. IMPRESSION: Dislocated LEFT total hip arthroplasty.  No visible fracture. Recommend repeat imaging post reduction Electronically Signed   By: Staci Righter M.D.   On: 10/08/2018 19:55    EKG: Orders placed or performed during the hospital encounter of 10/08/18  . EKG 12-Lead  . EKG 12-Lead     Hospital Course: [name] is a 79 y.o. who presented to the ED on 10/19/18 with left hip dislocation following a forward flexion moment. Attempt at closed reduction in the ED was unsuccessful. He was admitted to [facility] in anticipation for surgery with Dr. Alvan Dame and for pain control. They were brought to the operating room on 10/21/2018 and underwent Procedure(s): POSTERIOR REVISION HIP WITH POSSIBLE CONSTRAINED LINER.  Patient tolerated the procedure well and was later transferred to the recovery room and then to the orthopaedic floor for postoperative care. They were given PO and IV analgesics for pain control following their surgery. They were given 24 hours of postoperative antibiotics of  Anti-infectives (From admission, onward)   Start     Dose/Rate Route Frequency Ordered Stop   10/21/18 1630  ceFAZolin (ANCEF) IVPB 2g/100 mL premix     2 g 200 mL/hr over 30 Minutes Intravenous Every 6 hours 10/21/18 1435 10/21/18 2342   10/21/18 0900  ceFAZolin (ANCEF) IVPB 2g/100 mL premix     2 g 200 mL/hr over 30 Minutes Intravenous On call to O.R. 10/21/18 6659 10/21/18 1035  and started on DVT prophylaxis in the form of Eliquis.   PT and OT were ordered for total joint protocol. Discharge planning  consulted to help with postop disposition and equipment needs. Patient had a good night on the evening of surgery. They started to get up OOB with therapy on POD #1. Patient was seen in rounds and was ready for discharge pending progress with therapy. Pt worked with therapy for 3 additional sessions and was meeting their goals. He was discharged to home later that day in stable condition.  Diet: Regular diet Activity: WBAT Follow-up: in 2 weeks Disposition: Home Discharged Condition: good   Discharge Instructions    Call MD / Call 911   Complete by: As directed    If you experience chest pain or shortness of breath, CALL 911 and be transported to the hospital emergency room.  If you develope a fever above 101 F, pus (white drainage) or increased drainage or redness at the wound, or calf pain, call your surgeon's office.   Call MD / Call 911   Complete by: As directed    If you experience chest pain or shortness of breath, CALL 911 and be transported to the hospital emergency room.  If you develope a fever above 101 F, pus (white drainage) or increased drainage or redness at the wound, or calf pain, call your surgeon's office.   Change dressing   Complete by: As directed    Maintain surgical dressing until follow up in the clinic. If the edges start to pull up, may reinforce with tape. If the dressing is no longer working, may remove and cover with gauze and tape, but must keep the area dry and clean.  Call with any questions or concerns.   Change dressing   Complete by: As directed    Maintain surgical dressing until follow up in the clinic. If the edges start to pull up, may reinforce with tape. If the dressing is no longer working, may remove and cover with gauze and tape, but must keep the area dry and clean.  Call with any questions or concerns.   Constipation Prevention   Complete by: As directed    Drink plenty of fluids.  Prune juice may be helpful.  You may use a stool softener, such  as Colace (over the counter) 100 mg twice a day.  Use MiraLax (over the counter) for constipation as needed.   Constipation Prevention   Complete by: As directed    Drink plenty of fluids.  Prune juice may be helpful.  You may use a stool softener, such as Colace (over the counter) 100 mg twice a day.  Use MiraLax (over the counter) for constipation as needed.   Diet - low sodium heart healthy   Complete by: As directed    Discharge instructions   Complete by: As directed    Maintain surgical dressing until follow up in the clinic. If the edges start to pull up, may reinforce with tape. If the dressing is no longer working, may remove and cover with gauze and tape, but must keep the area dry and clean.  Follow up in 2 weeks at [facility]. Call with any questions or concerns.   Increase activity slowly as tolerated   Complete by: As directed    Weight bearing as tolerated with assist device (walker, cane, etc) as directed, use it as long as suggested by your surgeon or therapist, typically at least 4-6 weeks.   Increase activity slowly  as tolerated   Complete by: As directed    TED hose   Complete by: As directed    Use stockings (TED hose) for 2 weeks on both leg(s).  You may remove them at night for sleeping.   TED hose   Complete by: As directed    Use stockings (TED hose) for 2 weeks on both leg(s).  You may remove them at night for sleeping.     Allergies as of 10/22/2018      Reactions   Doxycycline    Gabapentin Other (See Comments)   Pt states make him go down to the floor and can not get up   Amlodipine Nausea And Vomiting, Other (See Comments)   dizziness   Fish Allergy Nausea And Vomiting   STATES HE HAS NOT EATEN ANY FISH INCLUDING SHELLFISH FOR PAST 40 YRS - IT CAUSED NAUSEA   Hydrocodone Itching, Rash   Other Nausea And Vomiting   STATES HE HAS NOT EATEN ANY FISH INCLUDING SHELLFISH FOR PAST 40 YRS - IT CAUSED NAUSEA   Tylenol [acetaminophen] Other (See  Comments)   Liver problems      Medication List    STOP taking these medications   cephALEXin 500 MG capsule Commonly known as: KEFLEX   lidocaine 5 % ointment Commonly known as: XYLOCAINE     TAKE these medications   albuterol 108 (90 Base) MCG/ACT inhaler Commonly known as: VENTOLIN HFA Inhale 2 puffs into the lungs every 6 (six) hours as needed for wheezing or shortness of breath.   apixaban 5 MG Tabs tablet Commonly known as: ELIQUIS Take 1 tablet (5 mg total) by mouth 2 (two) times daily.   ferrous sulfate 325 (65 FE) MG tablet Take 1 tablet (325 mg total) by mouth 3 (three) times daily after meals for 14 days.   finasteride 5 MG tablet Commonly known as: PROSCAR Take 1 tablet (5 mg total) by mouth daily.   fluticasone 50 MCG/ACT nasal spray Commonly known as: FLONASE PLACE 1 SPRAY INTO BOTH NOSTRILS EVERY MORNING. What changed:   how much to take  how to take this  when to take this  additional instructions   furosemide 80 MG tablet Commonly known as: LASIX Take 1 tablet (80 mg total) by mouth daily.   Guaifenesin 1200 MG Tb12 Take 1 tablet (1,200 mg total) by mouth 2 (two) times daily. What changed:   when to take this  reasons to take this   lactulose 10 GM/15ML solution Commonly known as: Caberfae 30 MLS BY MOUTH 2 TIMES DAILY AS NEEDED FOR MILD CONSTIPATION OR MODERATE CONSTIPATION What changed: See the new instructions.   methocarbamol 500 MG tablet Commonly known as: ROBAXIN Take 1 tablet (500 mg total) by mouth every 6 (six) hours as needed for muscle spasms.   NARCAN NA Place 1 application into the nose once.   oxycodone 30 MG immediate release tablet Commonly known as: ROXICODONE Take 1 tablet (30 mg total) by mouth every 6 (six) hours as needed for moderate pain or severe pain. What changed: when to take this   potassium chloride SA 20 MEQ tablet Commonly known as: Klor-Con M20 Take 1 tablet (20 mEq total) by mouth  daily.   pravastatin 20 MG tablet Commonly known as: PRAVACHOL TAKE 1 TABLET BY MOUTH EVERY DAY   sertraline 25 MG tablet Commonly known as: ZOLOFT TAKE 1 TABLET BY MOUTH EVERYDAY AT BEDTIME What changed: See the new instructions.   spironolactone 25 MG  tablet Commonly known as: ALDACTONE Take 1 tablet (25 mg total) by mouth daily.   tamsulosin 0.4 MG Caps capsule Commonly known as: FLOMAX Take 1 capsule (0.4 mg total) by mouth daily.            Discharge Care Instructions  (From admission, onward)         Start     Ordered   10/21/18 0000  Change dressing    Comments: Maintain surgical dressing until follow up in the clinic. If the edges start to pull up, may reinforce with tape. If the dressing is no longer working, may remove and cover with gauze and tape, but must keep the area dry and clean.  Call with any questions or concerns.   10/21/18 1707   10/21/18 0000  Change dressing    Comments: Maintain surgical dressing until follow up in the clinic. If the edges start to pull up, may reinforce with tape. If the dressing is no longer working, may remove and cover with gauze and tape, but must keep the area dry and clean.  Call with any questions or concerns.   10/21/18 1707         Follow-up Information    [name], MD. Schedule an appointment as soon as possible for a visit in 2 weeks.   Specialty: Orthopedic Surgery Contact information: [address] [place] [address] [phone]           Signed: Griffith Citron, PA-C Orthopedic Surgery 10/26/2018, 11:03 AM

## 2018-11-03 ENCOUNTER — Other Ambulatory Visit: Payer: Self-pay

## 2018-11-03 MED ORDER — POTASSIUM CHLORIDE CRYS ER 20 MEQ PO TBCR: 20.0000 meq | EXTENDED_RELEASE_TABLET | Freq: Every day | ORAL | 0 refills | Status: DC

## 2018-11-04 ENCOUNTER — Other Ambulatory Visit: Payer: Self-pay

## 2018-11-04 MED ORDER — POTASSIUM CHLORIDE CRYS ER 20 MEQ PO TBCR: 20.0000 meq | EXTENDED_RELEASE_TABLET | Freq: Every day | ORAL | 0 refills | Status: DC

## 2018-11-05 ENCOUNTER — Other Ambulatory Visit: Payer: Self-pay | Admitting: Internal Medicine

## 2018-11-08 DIAGNOSIS — M25551 Pain in right hip: Secondary | ICD-10-CM | POA: Diagnosis not present

## 2018-11-08 DIAGNOSIS — G894 Chronic pain syndrome: Secondary | ICD-10-CM | POA: Diagnosis not present

## 2018-11-08 DIAGNOSIS — I739 Peripheral vascular disease, unspecified: Secondary | ICD-10-CM | POA: Diagnosis not present

## 2018-11-08 DIAGNOSIS — M545 Low back pain: Secondary | ICD-10-CM | POA: Diagnosis not present

## 2018-11-08 DIAGNOSIS — M5137 Other intervertebral disc degeneration, lumbosacral region: Secondary | ICD-10-CM | POA: Diagnosis not present

## 2018-11-10 DIAGNOSIS — M79641 Pain in right hand: Secondary | ICD-10-CM | POA: Diagnosis not present

## 2018-11-10 DIAGNOSIS — M79642 Pain in left hand: Secondary | ICD-10-CM | POA: Diagnosis not present

## 2018-11-16 ENCOUNTER — Other Ambulatory Visit: Payer: Self-pay | Admitting: Internal Medicine

## 2018-11-18 SURGERY — CLOSED REDUCTION, HIP
Anesthesia: General | Laterality: Left

## 2018-11-22 ENCOUNTER — Encounter: Payer: Self-pay | Admitting: Internal Medicine

## 2018-11-26 ENCOUNTER — Telehealth: Payer: Self-pay

## 2018-11-26 ENCOUNTER — Encounter: Payer: Self-pay | Admitting: Internal Medicine

## 2018-11-26 NOTE — Telephone Encounter (Signed)
PA started on CoverMyMeds KEY: [id number] PA approved till 04/21/2019

## 2018-11-28 ENCOUNTER — Other Ambulatory Visit: Payer: Self-pay | Admitting: Internal Medicine

## 2018-11-29 ENCOUNTER — Encounter: Payer: Self-pay | Admitting: Internal Medicine

## 2018-11-29 MED ORDER — ALBUTEROL SULFATE HFA 108 (90 BASE) MCG/ACT IN AERS: INHALATION_SPRAY | RESPIRATORY_TRACT | 1 refills | Status: DC

## 2018-11-29 NOTE — Addendum Note (Signed)
Addended by: Pollyann Glen on: 11/29/2018 03:20 PM   Modules accepted: Orders

## 2018-11-29 NOTE — Telephone Encounter (Signed)
Sent!

## 2018-11-29 NOTE — Telephone Encounter (Signed)
Patients insurance calling to request the Ventolin be called in now that PA is approved.

## 2018-12-08 DIAGNOSIS — I739 Peripheral vascular disease, unspecified: Secondary | ICD-10-CM | POA: Diagnosis not present

## 2018-12-08 DIAGNOSIS — M5137 Other intervertebral disc degeneration, lumbosacral region: Secondary | ICD-10-CM | POA: Diagnosis not present

## 2018-12-08 DIAGNOSIS — G894 Chronic pain syndrome: Secondary | ICD-10-CM | POA: Diagnosis not present

## 2018-12-08 DIAGNOSIS — M25551 Pain in right hip: Secondary | ICD-10-CM | POA: Diagnosis not present

## 2018-12-08 DIAGNOSIS — M545 Low back pain: Secondary | ICD-10-CM | POA: Diagnosis not present

## 2018-12-15 ENCOUNTER — Telehealth: Payer: Self-pay | Admitting: Cardiology

## 2018-12-15 DIAGNOSIS — I4819 Other persistent atrial fibrillation: Secondary | ICD-10-CM

## 2018-12-15 NOTE — Telephone Encounter (Signed)
 *  STAT* If patient is at the pharmacy, call can be transferred to refill team.   1. Which medications need to be refilled? (please list name of each medication and dose if known) apixaban (ELIQUIS) 5 MG TABS tablet  2. Which pharmacy/location (including street and city if local pharmacy) is medication to be sent to? [facility] [address]  3. Do they need a 30 day or 90 day supply? 90   Please let patient know when prescription is sent so he can contact his son to come into town to pick it up for him.

## 2018-12-16 MED ORDER — APIXABAN 5 MG PO TABS: 5.0000 mg | ORAL_TABLET | Freq: Two times a day (BID) | ORAL | 0 refills | Status: DC

## 2018-12-22 ENCOUNTER — Other Ambulatory Visit: Payer: Self-pay | Admitting: Internal Medicine

## 2018-12-24 ENCOUNTER — Ambulatory Visit (INDEPENDENT_AMBULATORY_CARE_PROVIDER_SITE_OTHER): Payer: Medicare Other | Admitting: Internal Medicine

## 2018-12-24 ENCOUNTER — Encounter: Payer: Self-pay | Admitting: Internal Medicine

## 2018-12-24 DIAGNOSIS — N183 Chronic kidney disease, stage 3 unspecified: Secondary | ICD-10-CM

## 2018-12-24 DIAGNOSIS — D649 Anemia, unspecified: Secondary | ICD-10-CM | POA: Diagnosis not present

## 2018-12-24 NOTE — Progress Notes (Signed)
Virtual Visit via Audio Note  I connected with [name] on 12/24/18 at 12:20 PM EDT by an audio-only enabled telemedicine application and verified that I am speaking with the correct person using two identifiers.  The patient and the provider were at separate locations throughout the entire encounter.   I discussed the limitations of evaluation and management by telemedicine and the availability of in person appointments. The patient expressed understanding and agreed to proceed.  History of Present Illness: The patient is a 78 y.o. man with visit for back pain. He has been finally been doing better with his left hip. This had been going in and out of place and finally surgery in July. Since that time he has not had it going out of place. Started getting better in the last few months. He still struggles with severe pain. Uses oxycodone which seems to do okay. Has no new problems with diarrhea or constipation. Overall it is stable. Has tried oxycodone which he is on chronically #180 pills per month 30 mg which was last filled on 12/08/18. Denies falls recently. Did worry about his kidneys as he had AKI around time of surgery in June/July.  Observations/Objective: Voice strong, no dyspnea or coughing, A and O times 3  Assessment and Plan: See problem oriented charting  Follow Up Instructions: check u/a and CMP to rule out kidney changes  Visit time 13 minutes: that time was spent in non-face to face counseling and coordination of care with the patient: counseled about as above  I discussed the assessment and treatment plan with the patient. The patient was provided an opportunity to ask questions and all were answered. The patient agreed with the plan and demonstrated an understanding of the instructions.   The patient was advised to call back or seek an in-person evaluation if the symptoms worsen or if the condition fails to improve as anticipated.  [name], MD

## 2018-12-24 NOTE — Assessment & Plan Note (Signed)
Checking CMP.  

## 2018-12-24 NOTE — Assessment & Plan Note (Addendum)
Checking CBC today to assess stability. Last was down slightly around time of surgery and was expected decline.

## 2019-01-05 ENCOUNTER — Other Ambulatory Visit: Payer: Self-pay

## 2019-01-05 ENCOUNTER — Other Ambulatory Visit (INDEPENDENT_AMBULATORY_CARE_PROVIDER_SITE_OTHER): Payer: Medicare Other

## 2019-01-05 ENCOUNTER — Ambulatory Visit (HOSPITAL_COMMUNITY)
Admission: RE | Admit: 2019-01-05 | Discharge: 2019-01-05 | Disposition: A | Discharge status: Home / Self Care | Payer: Medicare Other | Source: Ambulatory Visit | Attending: Cardiovascular Disease | Admitting: Cardiovascular Disease

## 2019-01-05 ENCOUNTER — Other Ambulatory Visit: Payer: Self-pay | Admitting: Internal Medicine

## 2019-01-05 ENCOUNTER — Ambulatory Visit (HOSPITAL_BASED_OUTPATIENT_CLINIC_OR_DEPARTMENT_OTHER)
Admission: RE | Admit: 2019-01-05 | Discharge: 2019-01-05 | Disposition: A | Discharge status: Home / Self Care | Payer: Medicare Other | Source: Ambulatory Visit | Attending: Cardiology | Admitting: Cardiology

## 2019-01-05 ENCOUNTER — Other Ambulatory Visit (HOSPITAL_COMMUNITY): Payer: Self-pay | Admitting: Cardiology

## 2019-01-05 DIAGNOSIS — M25551 Pain in right hip: Secondary | ICD-10-CM | POA: Diagnosis not present

## 2019-01-05 DIAGNOSIS — I6523 Occlusion and stenosis of bilateral carotid arteries: Secondary | ICD-10-CM | POA: Insufficient documentation

## 2019-01-05 DIAGNOSIS — N183 Chronic kidney disease, stage 3 unspecified: Secondary | ICD-10-CM

## 2019-01-05 DIAGNOSIS — G894 Chronic pain syndrome: Secondary | ICD-10-CM | POA: Diagnosis not present

## 2019-01-05 DIAGNOSIS — I714 Abdominal aortic aneurysm, without rupture, unspecified: Secondary | ICD-10-CM

## 2019-01-05 DIAGNOSIS — M5137 Other intervertebral disc degeneration, lumbosacral region: Secondary | ICD-10-CM | POA: Diagnosis not present

## 2019-01-05 DIAGNOSIS — D649 Anemia, unspecified: Secondary | ICD-10-CM | POA: Diagnosis not present

## 2019-01-05 DIAGNOSIS — I739 Peripheral vascular disease, unspecified: Secondary | ICD-10-CM | POA: Diagnosis not present

## 2019-01-05 DIAGNOSIS — M545 Low back pain: Secondary | ICD-10-CM | POA: Diagnosis not present

## 2019-01-05 LAB — CBC
HCT: 38.3 % — ABNORMAL LOW (ref 39.0–52.0)
Hemoglobin: 12.4 g/dL — ABNORMAL LOW (ref 13.0–17.0)
MCHC: 32.5 g/dL (ref 30.0–36.0)
MCV: 84.9 fl (ref 78.0–100.0)
Platelets: 264 10*3/uL (ref 150.0–400.0)
RBC: 4.51 Mil/uL (ref 4.22–5.81)
RDW: 15.8 % — ABNORMAL HIGH (ref 11.5–15.5)
WBC: 8.3 10*3/uL (ref 4.0–10.5)

## 2019-01-05 LAB — URINALYSIS, ROUTINE W REFLEX MICROSCOPIC
Bilirubin Urine: NEGATIVE
Ketones, ur: NEGATIVE
Nitrite: POSITIVE — AB
Specific Gravity, Urine: 1.02 (ref 1.000–1.030)
Total Protein, Urine: NEGATIVE
Urine Glucose: NEGATIVE
Urobilinogen, UA: 0.2 (ref 0.0–1.0)
pH: 5.5 (ref 5.0–8.0)

## 2019-01-05 LAB — COMPREHENSIVE METABOLIC PANEL
ALT: 23 U/L (ref 0–53)
AST: 23 U/L (ref 0–37)
Albumin: 3.8 g/dL (ref 3.5–5.2)
Alkaline Phosphatase: 125 U/L — ABNORMAL HIGH (ref 39–117)
BUN: 52 mg/dL — ABNORMAL HIGH (ref 6–23)
CO2: 30 mEq/L (ref 19–32)
Calcium: 9.1 mg/dL (ref 8.4–10.5)
Chloride: 100 mEq/L (ref 96–112)
Creatinine, Ser: 1.52 mg/dL — ABNORMAL HIGH (ref 0.40–1.50)
GFR: 44.79 mL/min — ABNORMAL LOW (ref >60.00)
Glucose, Bld: 106 mg/dL — ABNORMAL HIGH (ref 70–99)
Potassium: 4.3 mEq/L (ref 3.5–5.1)
Sodium: 139 mEq/L (ref 135–145)
Total Bilirubin: 0.7 mg/dL (ref 0.2–1.2)
Total Protein: 7.6 g/dL (ref 6.0–8.3)

## 2019-01-05 MED ORDER — SULFAMETHOXAZOLE-TRIMETHOPRIM 800-160 MG PO TABS: 1.0000 | ORAL_TABLET | Freq: Two times a day (BID) | ORAL | 0 refills | Status: DC

## 2019-01-12 ENCOUNTER — Ambulatory Visit (INDEPENDENT_AMBULATORY_CARE_PROVIDER_SITE_OTHER): Payer: Medicare Other | Admitting: Internal Medicine

## 2019-01-12 ENCOUNTER — Other Ambulatory Visit: Payer: Self-pay | Admitting: Cardiology

## 2019-01-12 ENCOUNTER — Other Ambulatory Visit (INDEPENDENT_AMBULATORY_CARE_PROVIDER_SITE_OTHER): Payer: Medicare Other

## 2019-01-12 ENCOUNTER — Encounter: Payer: Self-pay | Admitting: Internal Medicine

## 2019-01-12 DIAGNOSIS — N183 Chronic kidney disease, stage 3 (moderate): Secondary | ICD-10-CM

## 2019-01-12 DIAGNOSIS — Z471 Aftercare following joint replacement surgery: Secondary | ICD-10-CM | POA: Diagnosis not present

## 2019-01-12 DIAGNOSIS — Z96642 Presence of left artificial hip joint: Secondary | ICD-10-CM | POA: Diagnosis not present

## 2019-01-12 DIAGNOSIS — N179 Acute kidney failure, unspecified: Secondary | ICD-10-CM | POA: Diagnosis not present

## 2019-01-12 LAB — COMPREHENSIVE METABOLIC PANEL
ALT: 15 U/L (ref 0–53)
AST: 18 U/L (ref 0–37)
Albumin: 3.8 g/dL (ref 3.5–5.2)
Alkaline Phosphatase: 131 U/L — ABNORMAL HIGH (ref 39–117)
BUN: 64 mg/dL — ABNORMAL HIGH (ref 6–23)
CO2: 27 mEq/L (ref 19–32)
Calcium: 9.5 mg/dL (ref 8.4–10.5)
Chloride: 94 mEq/L — ABNORMAL LOW (ref 96–112)
Creatinine, Ser: 2.59 mg/dL — ABNORMAL HIGH (ref 0.40–1.50)
GFR: 24.21 mL/min — ABNORMAL LOW (ref >60.00)
Glucose, Bld: 118 mg/dL — ABNORMAL HIGH (ref 70–99)
Potassium: 4.3 mEq/L (ref 3.5–5.1)
Sodium: 132 mEq/L — ABNORMAL LOW (ref 135–145)
Total Bilirubin: 0.9 mg/dL (ref 0.2–1.2)
Total Protein: 7.8 g/dL (ref 6.0–8.3)

## 2019-01-12 LAB — URINALYSIS, ROUTINE W REFLEX MICROSCOPIC
Bilirubin Urine: NEGATIVE
Hgb urine dipstick: NEGATIVE
Ketones, ur: NEGATIVE
Leukocytes,Ua: NEGATIVE
Nitrite: NEGATIVE
RBC / HPF: NONE SEEN (ref 0–?)
Specific Gravity, Urine: 1.015 (ref 1.000–1.030)
Total Protein, Urine: NEGATIVE
Urine Glucose: NEGATIVE
Urobilinogen, UA: 1 (ref 0.0–1.0)
pH: 5.5 (ref 5.0–8.0)

## 2019-01-12 LAB — CBC
HCT: 36.6 % — ABNORMAL LOW (ref 39.0–52.0)
Hemoglobin: 12.1 g/dL — ABNORMAL LOW (ref 13.0–17.0)
MCHC: 33 g/dL (ref 30.0–36.0)
MCV: 84 fl (ref 78.0–100.0)
Platelets: 244 10*3/uL (ref 150.0–400.0)
RBC: 4.36 Mil/uL (ref 4.22–5.81)
RDW: 15.8 % — ABNORMAL HIGH (ref 11.5–15.5)
WBC: 8.9 10*3/uL (ref 4.0–10.5)

## 2019-01-12 MED ORDER — CIPROFLOXACIN HCL 500 MG PO TABS: 500.0000 mg | ORAL_TABLET | Freq: Two times a day (BID) | ORAL | 0 refills | Status: DC

## 2019-01-12 MED ORDER — LIDOCAINE 5 % EX OINT: 1.0000 "application " | TOPICAL_OINTMENT | CUTANEOUS | 6 refills | Status: DC | PRN

## 2019-01-12 NOTE — Progress Notes (Signed)
Virtual Visit via Audio Note  I connected with [name] on 01/12/19 at 10:00 AM EDT by an audio-only enabled telemedicine application and verified that I am speaking with the correct person using two identifiers.  The patient and the provider were at separate locations throughout the entire encounter.   I discussed the limitations of evaluation and management by telemedicine and the availability of in person appointments. The patient expressed understanding and agreed to proceed.  History of Present Illness: The patient is a 78 y.o. man with visit for urinary pain. Started about 3-4 weeks ago with burning in his kidney area of his back. He had labs and urine and showed possible infection. Took bactrim 5 day course and this helped a minimal amount. He is having some discomfort with urination but more in his back. Working with his pain specialist as he has a lot of severe spinal problems from cervical to lumbar. Seeing hip doctor later today. Has no fevers or chills. Denies blood in urine. Overall it is stable. Has tried bactrim  Observations/Objective: Voice strong, no coughing or dyspnea during visit, A and O times 3  Assessment and Plan: See problem oriented charting  Follow Up Instructions: rx cipro and recheck urine culture and u/a as well as CMP  Visit time 12 minutes: that time was spent in non-face to face counseling and coordination of care with the patient: counseled about as above  I discussed the assessment and treatment plan with the patient. The patient was provided an opportunity to ask questions and all were answered. The patient agreed with the plan and demonstrated an understanding of the instructions.   The patient was advised to call back or seek an in-person evaluation if the symptoms worsen or if the condition fails to improve as anticipated.  [name], MD

## 2019-01-12 NOTE — Assessment & Plan Note (Signed)
Rx cipro, checking u/a and urine culture. Also rechecking CMP and CBC.

## 2019-01-12 NOTE — Telephone Encounter (Signed)
45m 78kg Scr 1.52 01/05/19 Lovw/crenshaw 10/13/18

## 2019-01-13 ENCOUNTER — Emergency Department (HOSPITAL_COMMUNITY)
Admission: EM | Admit: 2019-01-13 | Discharge: 2019-01-13 | Disposition: A | Discharge status: Home / Self Care | Payer: Medicare Other | Attending: Emergency Medicine | Admitting: Emergency Medicine

## 2019-01-13 ENCOUNTER — Encounter: Payer: Self-pay | Admitting: Internal Medicine

## 2019-01-13 ENCOUNTER — Other Ambulatory Visit: Payer: Self-pay

## 2019-01-13 ENCOUNTER — Encounter (HOSPITAL_COMMUNITY): Payer: Self-pay

## 2019-01-13 DIAGNOSIS — Z5321 Procedure and treatment not carried out due to patient leaving prior to being seen by health care provider: Secondary | ICD-10-CM | POA: Insufficient documentation

## 2019-01-13 DIAGNOSIS — R7989 Other specified abnormal findings of blood chemistry: Secondary | ICD-10-CM | POA: Diagnosis present

## 2019-01-13 NOTE — ED Triage Notes (Signed)
Patient reports that he began having bilateral low back pain x 4 days. Patient states he had a "bad urine" and has been started on antibiotics. Patient states his "kidney labs are bad"   BUN-64 Creatinine-2.59

## 2019-01-13 NOTE — ED Notes (Signed)
CALLED IN WAITING AREA. UNABLE TO LOCATE PT

## 2019-01-13 NOTE — ED Notes (Signed)
CALLED FOR 3RD TIME. UNABLE TO LOCATE.

## 2019-01-13 NOTE — ED Notes (Signed)
REGISTRATION WALKED OUTSIDE TO SEE IF PT WAS THERE. UNABLE TO LOCATE PT. 2ND ATTEMPT

## 2019-01-14 ENCOUNTER — Emergency Department (HOSPITAL_COMMUNITY)
Admission: EM | Admit: 2019-01-14 | Discharge: 2019-01-14 | Disposition: A | Discharge status: Home / Self Care | Payer: Medicare Other | Attending: Emergency Medicine | Admitting: Emergency Medicine

## 2019-01-14 ENCOUNTER — Encounter (HOSPITAL_COMMUNITY): Payer: Self-pay | Admitting: Emergency Medicine

## 2019-01-14 ENCOUNTER — Emergency Department (HOSPITAL_COMMUNITY): Payer: Medicare Other

## 2019-01-14 ENCOUNTER — Other Ambulatory Visit: Payer: Self-pay

## 2019-01-14 DIAGNOSIS — I130 Hypertensive heart and chronic kidney disease with heart failure and stage 1 through stage 4 chronic kidney disease, or unspecified chronic kidney disease: Secondary | ICD-10-CM | POA: Diagnosis not present

## 2019-01-14 DIAGNOSIS — I2510 Atherosclerotic heart disease of native coronary artery without angina pectoris: Secondary | ICD-10-CM | POA: Diagnosis not present

## 2019-01-14 DIAGNOSIS — R799 Abnormal finding of blood chemistry, unspecified: Secondary | ICD-10-CM | POA: Diagnosis not present

## 2019-01-14 DIAGNOSIS — Z79899 Other long term (current) drug therapy: Secondary | ICD-10-CM | POA: Diagnosis not present

## 2019-01-14 DIAGNOSIS — Z7901 Long term (current) use of anticoagulants: Secondary | ICD-10-CM | POA: Diagnosis not present

## 2019-01-14 DIAGNOSIS — R109 Unspecified abdominal pain: Secondary | ICD-10-CM | POA: Diagnosis not present

## 2019-01-14 DIAGNOSIS — K8020 Calculus of gallbladder without cholecystitis without obstruction: Secondary | ICD-10-CM | POA: Diagnosis not present

## 2019-01-14 DIAGNOSIS — Z87891 Personal history of nicotine dependence: Secondary | ICD-10-CM | POA: Insufficient documentation

## 2019-01-14 DIAGNOSIS — N183 Chronic kidney disease, stage 3 (moderate): Secondary | ICD-10-CM | POA: Insufficient documentation

## 2019-01-14 DIAGNOSIS — Z951 Presence of aortocoronary bypass graft: Secondary | ICD-10-CM | POA: Insufficient documentation

## 2019-01-14 DIAGNOSIS — N179 Acute kidney failure, unspecified: Secondary | ICD-10-CM

## 2019-01-14 DIAGNOSIS — I5032 Chronic diastolic (congestive) heart failure: Secondary | ICD-10-CM | POA: Insufficient documentation

## 2019-01-14 DIAGNOSIS — R1011 Right upper quadrant pain: Secondary | ICD-10-CM | POA: Diagnosis present

## 2019-01-14 DIAGNOSIS — J449 Chronic obstructive pulmonary disease, unspecified: Secondary | ICD-10-CM | POA: Diagnosis not present

## 2019-01-14 LAB — URINE CULTURE
MICRO NUMBER:: 913806
Result:: NO GROWTH
SPECIMEN QUALITY:: ADEQUATE

## 2019-01-14 LAB — CBC
HCT: 40.1 % (ref 39.0–52.0)
Hemoglobin: 12.9 g/dL — ABNORMAL LOW (ref 13.0–17.0)
MCH: 27.9 pg (ref 26.0–34.0)
MCHC: 32.2 g/dL (ref 30.0–36.0)
MCV: 86.8 fL (ref 80.0–100.0)
Platelets: 251 10*3/uL (ref 150–400)
RBC: 4.62 MIL/uL (ref 4.22–5.81)
RDW: 14.7 % (ref 11.5–15.5)
WBC: 7.4 10*3/uL (ref 4.0–10.5)
nRBC: 0 % (ref 0.0–0.2)

## 2019-01-14 LAB — URINALYSIS, ROUTINE W REFLEX MICROSCOPIC
Bilirubin Urine: NEGATIVE
Glucose, UA: NEGATIVE mg/dL
Hgb urine dipstick: NEGATIVE
Ketones, ur: NEGATIVE mg/dL
Leukocytes,Ua: NEGATIVE
Nitrite: NEGATIVE
Protein, ur: NEGATIVE mg/dL
Specific Gravity, Urine: 1.009 (ref 1.005–1.030)
pH: 5 (ref 5.0–8.0)

## 2019-01-14 LAB — COMPREHENSIVE METABOLIC PANEL
ALT: 19 U/L (ref 0–44)
AST: 20 U/L (ref 15–41)
Albumin: 3.8 g/dL (ref 3.5–5.0)
Alkaline Phosphatase: 123 U/L (ref 38–126)
Anion gap: 14 (ref 5–15)
BUN: 63 mg/dL — ABNORMAL HIGH (ref 8–23)
CO2: 21 mmol/L — ABNORMAL LOW (ref 22–32)
Calcium: 9.1 mg/dL (ref 8.9–10.3)
Chloride: 101 mmol/L (ref 98–111)
Creatinine, Ser: 1.89 mg/dL — ABNORMAL HIGH (ref 0.61–1.24)
GFR calc Af Amer: 39 mL/min — ABNORMAL LOW (ref >60)
GFR calc non Af Amer: 34 mL/min — ABNORMAL LOW (ref >60)
Glucose, Bld: 135 mg/dL — ABNORMAL HIGH (ref 70–99)
Potassium: 3.5 mmol/L (ref 3.5–5.1)
Sodium: 136 mmol/L (ref 135–145)
Total Bilirubin: 1.1 mg/dL (ref 0.3–1.2)
Total Protein: 8.3 g/dL — ABNORMAL HIGH (ref 6.5–8.1)

## 2019-01-14 MED ORDER — SODIUM CHLORIDE 0.9 % IV BOLUS
1000.0000 mL | Freq: Once | INTRAVENOUS | Status: AC
  Administered 2019-01-14: 1000 mL via INTRAVENOUS

## 2019-01-14 MED ORDER — OXYCODONE HCL 5 MG PO TABS
30.0000 mg | ORAL_TABLET | Freq: Once | ORAL | Status: AC
  Administered 2019-01-14: 30 mg via ORAL
  Filled 2019-01-14: qty 6

## 2019-01-14 NOTE — Discharge Instructions (Addendum)
Please call your primary doctor on Monday to schedule close follow-up appointment and to have your blood work rechecked early next week.  If you develop worsening pain, vomiting, fever, please return to ER for reassessment.  Recommend drinking fluids regularly as discussed.

## 2019-01-14 NOTE — ED Notes (Signed)
Pt verbalized discharge instructions and follow up care. Alert and ambulatory with walker. No iv. Son is driving pt home.

## 2019-01-14 NOTE — ED Triage Notes (Signed)
Pt had right flank pains and had labs/urine done at [facility] on Monday. Was started on cipro which helped pains. Reports that labs results of urine were all over the place so wanted pt to go to ED for further eval.

## 2019-01-14 NOTE — ED Notes (Signed)
Patient transported to CT 

## 2019-01-14 NOTE — ED Notes (Signed)
Pt requesting pain medications for chronic neck pain. PT states pain currently is 10/10, will speak with provider about providing pt with home medications for pain relief.

## 2019-01-14 NOTE — ED Provider Notes (Signed)
[name] Provider Note    CSN: [id number] Arrival date & time: 01/14/19  0957     History   Chief Complaint Chief Complaint  Patient presents with  . Abnormal Lab    HPI VEDAD [name] is a 78 y.o. male.  Presents emerged from with report of abnormal labs.  Patient reports that couple weeks ago he is experiencing some back, flank pain.  Worse on right side.  Was intermittent, mild to moderate in severity.  Dull ache.  With head pain improved with his oxycodones that he takes for his other chronic pains.  Went to his primary doctor was told that he had a urinary tract infection.  He completed a course of Bactrim but was still having symptoms at which time the primary doctor's gave him a prescription for ciprofloxacin.  Has been taking 2 days of this.  [name] that he was told he had an abnormal lab value and was recommended to go to ER for reassessment.  Denies fever.  States he is no longer having any symptoms today.  Denies any associated nausea or vomiting.     HPI  Past Medical History:  Diagnosis Date  . AAA (abdominal aortic aneurysm) ([place])   . Blindness of left eye   . 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 CAD (coronary artery disease)    a. s/p CABG in 10/2008 with LIMA-LAD, SVG-OM1 with Y-graft to PL branch, and SVG-RCA  . Carotid stenosis   . Cerebrovascular disease   . CHF (congestive heart failure) ([place])   . Cirrhosis ([place])    on CT a/p 07/2013, no longer drinking  . CONGENITAL UNSPEC REDUCTION DEFORMITY LOWER LIMB   . Constipation due to opioid therapy 2014   began about a year ago  . COPD (chronic obstructive pulmonary disease) ([place])   . HTN (hypertension)   . Hyperlipidemia   . 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 TOBACCO USE, QUIT     Patient Active Problem List   Diagnosis Date Noted  . S/P left THA revision, posterior 10/21/2018  . Hip instability, left  10/19/2018  . Closed dislocation of left hip ([place]) 10/09/2018  . Olecranon bursitis of left elbow 07/02/2018  . Failed total hip arthroplasty with dislocation, initial encounter ([place]) 06/22/2018  . Closed left hip fracture, initial encounter ([place]) 01/26/2018  . Neck pain 12/18/2017  . Right hip pain 06/22/2017  . Chronic diastolic heart failure ([place]) 03/16/2017  . CKD (chronic kidney disease) stage 3, GFR 30-59 ml/min ([facility]) 03/16/2017  . Diastolic dysfunction [id number]  . Opiate dependence ([place]) 02/13/2017  . Uremia 01/21/2017  . Acute renal failure superimposed on stage 3 chronic kidney disease ([place]) 01/21/2017  . Atrial flutter ([place]) 11/24/2014  . Aortic stenosis 03/24/2014  . Second degree AV block, Mobitz type I 09/06/2013  . Cirrhosis ([place]) 07/28/2013  . Bladder outlet obstruction 07/28/2013  . Impaired glucose metabolism   . Chronic back pain   . Benign neoplasm of colon 01/08/2011  . Abdominal aortic aneurysm ([place]) 08/12/2010  . TOBACCO USE, QUIT 04/11/2009  . Occlusion and stenosis of carotid artery 01/08/2009  . Hyperlipidemia 11/13/2008  . ANEMIA 11/13/2008  . Essential hypertension 11/13/2008  . Coronary atherosclerosis 11/13/2008  . COPD (chronic obstructive pulmonary disease) ([place]) 11/13/2008    Past Surgical History:  Procedure Laterality Date  . CORONARY ARTERY BYPASS GRAFT  11/03/2008   Ricard Dillon - x4: left internal mammary artery to the  distal left anterior descending, saphemous vein graft to the first circumflex marginal branch with a Y graft sequentiallly to a left posterolateral branch, saphenous vein graft to the distal right coronary artery  . ELECTROPHYSIOLOGIC STUDY N/A 01/18/2015   Procedure: A-Flutter Ablation;  Surgeon: Deboraha Sprang, MD;  Location: [facility];  Service: Cardiovascular;  Laterality: N/A;  . GREEN LIGHT LASER TURP (TRANSURETHRAL RESECTION OF PROSTATE N/A 02/14/2014   Procedure: GREEN LIGHT LASER TURP (TRANSURETHRAL RESECTION OF  PROSTATE;  Surgeon: Festus Aloe, MD;  Location: [facility];  Service: Urology;  Laterality: N/A;  . HIP ARTHROPLASTY Left 01/26/2018   Procedure: LEFT HIP POSTERIOR HEMIARTHROPLASTY;  Surgeon: [name], MD;  Location: [facility];  Service: Orthopedics;  Laterality: Left;  . HIP CLOSED REDUCTION Left 09/18/2018   Procedure: CLOSED MANIPULATION HIP;  Surgeon: Justice Britain, MD;  Location: [facility];  Service: Orthopedics;  Laterality: Left;  . HIP CLOSED REDUCTION Left 10/08/2018   Procedure: CLOSED MANIPULATION HIP;  Surgeon: Rod Can, MD;  Location: [facility];  Service: Orthopedics;  Laterality: Left;  . TOTAL HIP REVISION Left 06/24/2018   Procedure: ACETABULAR HIP REVISION POSTERIOR;  Surgeon: [name], MD;  Location: [facility];  Service: Orthopedics;  Laterality: Left;  . TOTAL HIP REVISION Left 10/21/2018   Procedure: POSTERIOR REVISION HIP WITH POSSIBLE CONSTRAINED LINER;  Surgeon: [name], MD;  Location: [facility];  Service: Orthopedics;  Laterality: Left;        Home Medications    Prior to Admission medications   Medication Sig Start Date End Date Taking? Authorizing Provider  albuterol (VENTOLIN HFA) 108 (90 Base) MCG/ACT inhaler TAKE 2 PUFFS BY MOUTH EVERY 6 HOURS AS NEEDED FOR WHEEZE OR SHORTNESS OF BREATH 11/29/18   [name], MD  ciprofloxacin (CIPRO) 500 MG tablet Take 1 tablet (500 mg total) by mouth 2 (two) times daily. 01/12/19   [name], MD  ELIQUIS 5 MG TABS tablet TAKE 1 TABLET BY MOUTH TWICE A DAY 01/12/19   Lelon Perla, MD  ferrous sulfate 325 (65 FE) MG tablet Take 1 tablet (325 mg total) by mouth 3 (three) times daily after meals for 14 days. 10/22/18 11/05/18  Maurice March, PA-C  finasteride (PROSCAR) 5 MG tablet Take 1 tablet (5 mg total) by mouth daily. 10/01/18   [name], MD  fluticasone (FLONASE) 50 MCG/ACT nasal spray PLACE 1 SPRAY INTO BOTH NOSTRILS EVERY MORNING. Patient taking differently: Place 1 spray into both  nostrils daily.  09/28/18   [name], MD  furosemide (LASIX) 80 MG tablet TAKE 1 TABLET (80 MG TOTAL) BY MOUTH DAILY. PATIENT NEEDS OFFICE VISIT BEFORE REFILLS WILL BE GIVEN 11/29/18   [name], MD  Guaifenesin 1200 MG TB12 Take 1 tablet (1,200 mg total) by mouth 2 (two) times daily. Patient taking differently: Take 1,200 mg by mouth 2 (two) times daily as needed (mucus).  02/15/18   [name], MD  lactulose (CHRONULAC) 10 GM/15ML solution TAKE 30 MLS BY MOUTH 2 TIMES DAILY AS NEEDED FOR MILD CONSTIPATION OR MODERATE CONSTIPATION Patient taking differently: Take 20 g by mouth 2 (two) times daily as needed for mild constipation.  06/01/18   [name], MD  lidocaine (XYLOCAINE) 5 % ointment Apply 1 application topically as needed. 01/12/19   [name], MD  methocarbamol (ROBAXIN) 500 MG tablet Take 1 tablet (500 mg total) by mouth every 6 (six) hours as needed for muscle spasms. 10/21/18  Griffith Citron R, PA-C  Naloxone HCl (NARCAN NA) Place 1 application into the nose once.    [provider]  oxyCODONE (ROXICODONE) 30 MG immediate release tablet Take 1 tablet (30 mg total) by mouth every 6 (six) hours as needed for moderate pain or severe pain. 10/21/18   Maurice March, PA-C  potassium chloride SA (KLOR-CON M20) 20 MEQ tablet Take 1 tablet (20 mEq total) by mouth daily. 11/04/18   Lelon Perla, MD  pravastatin (PRAVACHOL) 20 MG tablet TAKE 1 TABLET BY MOUTH EVERY DAY 12/22/18   [name], MD  sertraline (ZOLOFT) 25 MG tablet TAKE 1 TABLET BY MOUTH EVERYDAY AT BEDTIME Patient taking differently: Take 25 mg by mouth daily as needed (depression).  06/22/18   [name], MD  spironolactone (ALDACTONE) 25 MG tablet Take 1 tablet (25 mg total) by mouth daily. 09/28/18   [name], MD  tamsulosin (FLOMAX) 0.4 MG CAPS capsule Take 1 capsule (0.4 mg total) by mouth daily. 09/28/18   [name], MD     Family History Family History  Problem Relation Age of Onset  . Cirrhosis Mother        died at 33  . Heart attack Father   . Other Brother        GSW  . Other Brother        died in house fire  . Cancer Brother        unsure of type Believes colon or prostate/fim  . Colon cancer Neg Hx   . Stomach cancer Neg Hx     Social History Social History   Tobacco Use  . Smoking status: Former Research scientist (life sciences)  . Smokeless tobacco: Never Used  Substance Use Topics  . Alcohol use: No    Alcohol/week: 0.0 standard drinks    Comment: History of heavy alcohol use per pt. Quit many years ago1/1/ 2005  . Drug use: No     Allergies   Doxycycline, Gabapentin, Amlodipine, Fish allergy, Hydrocodone, Other, and Tylenol [acetaminophen]   Review of Systems Review of Systems  Constitutional: Negative for chills and fever.  HENT: Negative for ear pain and sore throat.   Eyes: Negative for pain and visual disturbance.  Respiratory: Negative for cough and shortness of breath.   Cardiovascular: Negative for chest pain and palpitations.  Gastrointestinal: Negative for abdominal pain and vomiting.  Genitourinary: Negative for dysuria and hematuria.       Flank pain  Musculoskeletal: Negative for arthralgias and back pain.  Skin: Negative for color change and rash.  Neurological: Negative for seizures and syncope.  All other systems reviewed and are negative.    Physical Exam Updated Vital Signs BP 113/75 (BP Location: Left Arm)   Pulse 76   Temp 97.9 F (36.6 C) (Oral)   Resp 19   SpO2 100%   Physical Exam Vitals signs and nursing note reviewed.  Constitutional:      Appearance: He is well-developed.  HENT:     Head: Normocephalic and atraumatic.  Eyes:     Conjunctiva/sclera: Conjunctivae normal.  Neck:     Musculoskeletal: Neck supple.  Cardiovascular:     Rate and Rhythm: Normal rate and regular rhythm.     Heart sounds: No murmur.  Pulmonary:     Effort: Pulmonary effort  is normal. No respiratory distress.     Breath sounds: Normal breath sounds.  Abdominal:     Palpations: Abdomen is soft.     Tenderness:  There is no abdominal tenderness.  Musculoskeletal:     Comments: No CVA tenderness, no flank tenderness to palpation  Skin:    General: Skin is warm and dry.  Neurological:     Mental Status: He is alert.      ED Treatments / Results  Labs (all labs ordered are listed, but only abnormal results are displayed) Labs Reviewed  COMPREHENSIVE METABOLIC PANEL - Abnormal; Notable for the following components:      Result Value   CO2 21 (*)    Glucose, Bld 135 (*)    BUN 63 (*)    Creatinine, Ser 1.89 (*)    Total Protein 8.3 (*)    GFR calc non Af Amer 34 (*)    GFR calc Af Amer 39 (*)    All other components within normal limits  CBC - Abnormal; Notable for the following components:   Hemoglobin 12.9 (*)    All other components within normal limits  URINALYSIS, ROUTINE W REFLEX MICROSCOPIC    EKG None  Radiology No results found.  Procedures Procedures (including critical care time)  Medications Ordered in ED Medications  sodium chloride 0.9 % bolus 1,000 mL (1,000 mLs Intravenous New Bag/Given 01/14/19 1605)  oxyCODONE (Oxy IR/ROXICODONE) immediate release tablet 30 mg (30 mg Oral Given 01/14/19 1627)     Initial Impression / Assessment and Plan / ED Course  I have reviewed the triage vital signs and the nursing notes.  Pertinent labs & imaging results that were available during my care of the patient were reviewed by me and considered in my medical decision making (see chart for details).        78 year old male presents to the ER per recommendation of his primary doctor after reported lab abnormality.  Patient was recently being treated with Bactrim for urinary tract infection.  Patient reports now his symptoms have completely resolved.  Labs were concerning for acute kidney injury.  Blood work today showed significant  improvement in his creatinine and kidney function.  Suspect his AKI was most likely related to Bactrim use.  Given that this seems to be spontaneously resolving, do not feel patient needs inpatient management.  Provided fluids, emphasize need for close recheck with PCP and repeat labs next week.  His urine today does not look infected, the last urinalysis culture was negative.  I do not see indication for further antibiotics at this time.  CT scan ordered to rule out stone, Pyelo.  No acute abnormalities noted.  After the discussed management above, the patient was determined to be safe for discharge.  The patient was in agreement with this plan and all questions regarding their care were answered.  ED return precautions were discussed and the patient will return to the ED with any significant worsening of condition.    Final Clinical Impressions(s) / ED Diagnoses   Final diagnoses:  Acute kidney injury [facility])    ED Discharge Orders    None       Lucrezia Starch, MD 01/15/19 0004

## 2019-01-15 ENCOUNTER — Encounter: Payer: Self-pay | Admitting: Internal Medicine

## 2019-01-15 ENCOUNTER — Other Ambulatory Visit: Payer: Self-pay | Admitting: Internal Medicine

## 2019-01-20 ENCOUNTER — Telehealth: Payer: Self-pay | Admitting: Cardiology

## 2019-01-20 DIAGNOSIS — I714 Abdominal aortic aneurysm, without rupture, unspecified: Secondary | ICD-10-CM

## 2019-01-20 DIAGNOSIS — I483 Typical atrial flutter: Secondary | ICD-10-CM

## 2019-01-20 MED ORDER — APIXABAN 2.5 MG PO TABS: 2.5000 mg | ORAL_TABLET | Freq: Two times a day (BID) | ORAL | 1 refills | Status: DC

## 2019-01-20 NOTE — Telephone Encounter (Signed)
7 m 72.6kg Scr 1.89 01/14/19 Lovw/crenshw 10/13/18 Pt needs 5mg  instead of the 2.5

## 2019-01-20 NOTE — Telephone Encounter (Signed)
°  Pt c/o medication issue:  1. Name of Medication: ELIQUIS 5 MG TABS tablet  2. How are you currently taking this medication (dosage and times per day)? 5 mg twice daily  3. Are you having a reaction (difficulty breathing--STAT)? no  4. What is your medication issue? Patient had labs drawn when he went to [facility], and the lab tech stated he may be able to reduce the dosage of his medicine from 5 mg 2x daily to 2.5 mg twice daily.  Patient wants to know if this is something Dr. Stanford Breed is OK with doing or not. If so, he will need a new rx sent to his pharmacy for the smaller dose.   Please advise

## 2019-01-20 NOTE — Telephone Encounter (Signed)
Called and instructed the pt to cut the 5mg  in1/2 and that raquel wants him on the 2.5 for now repeat labs in 1 mos

## 2019-02-02 DIAGNOSIS — G894 Chronic pain syndrome: Secondary | ICD-10-CM | POA: Diagnosis not present

## 2019-02-02 DIAGNOSIS — M25551 Pain in right hip: Secondary | ICD-10-CM | POA: Diagnosis not present

## 2019-02-02 DIAGNOSIS — I739 Peripheral vascular disease, unspecified: Secondary | ICD-10-CM | POA: Diagnosis not present

## 2019-02-02 DIAGNOSIS — M5137 Other intervertebral disc degeneration, lumbosacral region: Secondary | ICD-10-CM | POA: Diagnosis not present

## 2019-02-02 DIAGNOSIS — M545 Low back pain: Secondary | ICD-10-CM | POA: Diagnosis not present

## 2019-02-04 ENCOUNTER — Telehealth: Payer: Self-pay | Admitting: Cardiology

## 2019-02-04 NOTE — Telephone Encounter (Signed)
°*  STAT* If patient is at the pharmacy, call can be transferred to refill team.   1. Which medications need to be refilled? (please list name of each medication and dose if known) Klor-con m20 tablet  2. Which pharmacy/location (including street and city if local pharmacy) [facility]  3. Do they need a 30 day or 90 day supply? [place]

## 2019-02-07 ENCOUNTER — Other Ambulatory Visit: Payer: Self-pay

## 2019-02-07 MED ORDER — POTASSIUM CHLORIDE CRYS ER 20 MEQ PO TBCR: 20.0000 meq | EXTENDED_RELEASE_TABLET | Freq: Every day | ORAL | 0 refills | Status: DC

## 2019-02-25 ENCOUNTER — Telehealth: Payer: Self-pay

## 2019-02-25 MED ORDER — PREDNISONE 20 MG PO TABS: 40.0000 mg | ORAL_TABLET | Freq: Every day | ORAL | 0 refills | Status: DC

## 2019-02-25 NOTE — Telephone Encounter (Signed)
Pt called back in to follow up on request. Pt is hoping to not have to go through the weekend without it. Pt says that his hands are hurting and PCP usually prescribes prednisone for it  Please assist.

## 2019-02-25 NOTE — Telephone Encounter (Signed)
Noted  

## 2019-02-25 NOTE — Telephone Encounter (Signed)
Have sent in a weeks supply to help calm down the arthritis pain.

## 2019-02-25 NOTE — Addendum Note (Signed)
Addended by: [name] A on: 02/25/2019 03:42 PM   Modules accepted: Orders

## 2019-02-25 NOTE — Telephone Encounter (Signed)
Copied from [place] [id number]. Topic: General - Other >> Feb 25, 2019 12:17 PM Pauline Good wrote: Reason for CRM: pt is having really bad arthritis pain in his hands with swelling and need some prednisone sent to [facility]

## 2019-03-02 DIAGNOSIS — M25551 Pain in right hip: Secondary | ICD-10-CM | POA: Diagnosis not present

## 2019-03-02 DIAGNOSIS — G894 Chronic pain syndrome: Secondary | ICD-10-CM | POA: Diagnosis not present

## 2019-03-02 DIAGNOSIS — M5137 Other intervertebral disc degeneration, lumbosacral region: Secondary | ICD-10-CM | POA: Diagnosis not present

## 2019-03-02 DIAGNOSIS — M545 Low back pain: Secondary | ICD-10-CM | POA: Diagnosis not present

## 2019-03-02 DIAGNOSIS — I739 Peripheral vascular disease, unspecified: Secondary | ICD-10-CM | POA: Diagnosis not present

## 2019-03-23 ENCOUNTER — Encounter: Payer: Self-pay | Admitting: Internal Medicine

## 2019-03-24 ENCOUNTER — Telehealth: Payer: Self-pay | Admitting: Cardiology

## 2019-03-24 NOTE — Telephone Encounter (Signed)
Pt advised labs have been already ordered per the [facility] and he can come and have drawn at any time and to wear his mask when he comes to the office. Pt agreed.

## 2019-03-24 NOTE — Telephone Encounter (Signed)
Patient called stating he needs to have his labs done. Since he has been on Eliquis 2.5 mg for about 1-2 mos.

## 2019-03-25 ENCOUNTER — Ambulatory Visit: Payer: Medicare Other | Admitting: Family

## 2019-03-30 DIAGNOSIS — M25551 Pain in right hip: Secondary | ICD-10-CM | POA: Diagnosis not present

## 2019-03-30 DIAGNOSIS — M5137 Other intervertebral disc degeneration, lumbosacral region: Secondary | ICD-10-CM | POA: Diagnosis not present

## 2019-03-30 DIAGNOSIS — G894 Chronic pain syndrome: Secondary | ICD-10-CM | POA: Diagnosis not present

## 2019-03-30 DIAGNOSIS — I739 Peripheral vascular disease, unspecified: Secondary | ICD-10-CM | POA: Diagnosis not present

## 2019-03-30 DIAGNOSIS — M545 Low back pain: Secondary | ICD-10-CM | POA: Diagnosis not present

## 2019-04-01 ENCOUNTER — Other Ambulatory Visit: Payer: Self-pay | Admitting: Internal Medicine

## 2019-04-01 ENCOUNTER — Telehealth: Payer: Self-pay | Admitting: Cardiology

## 2019-04-01 MED ORDER — APIXABAN 2.5 MG PO TABS: 2.5000 mg | ORAL_TABLET | Freq: Two times a day (BID) | ORAL | 1 refills | Status: DC

## 2019-04-01 NOTE — Telephone Encounter (Signed)
Patient calling the office for samples of medication:   1.  What medication and dosage are you requesting samples for? apixaban (ELIQUIS) 2.5 MG TABS tablet  2.  Are you currently out of this medication? Yes  Patient is calling requesting he get some samples of Eliquis due to the fact Dr. Stanford Breed had changed his dosage and wanted blood work done to determine if any adjustments need to be made. The patient cannot go get his blood work done today as he originally planned and is currently out of medication. Please Advise.

## 2019-04-01 NOTE — Telephone Encounter (Signed)
Spoke with patient.  He depends on son (who works) to get him to appointments and labs.  Forgot about lab draw until he was about out of meds, has not been able to get in this week.  Will call in a 30 day supply of Eliquis 2.5 mg for him today (took last dose this am), and he will get labs ASAP.

## 2019-04-05 DIAGNOSIS — I483 Typical atrial flutter: Secondary | ICD-10-CM | POA: Diagnosis not present

## 2019-04-06 ENCOUNTER — Telehealth: Payer: Self-pay | Admitting: *Deleted

## 2019-04-06 LAB — COMPREHENSIVE METABOLIC PANEL
ALT: 18 IU/L (ref 0–44)
AST: 26 IU/L (ref 0–40)
Albumin/Globulin Ratio: 1.2 (ref 1.2–2.2)
Albumin: 3.8 g/dL (ref 3.7–4.7)
Alkaline Phosphatase: 132 IU/L — ABNORMAL HIGH (ref 39–117)
BUN/Creatinine Ratio: 24 (ref 10–24)
BUN: 42 mg/dL — ABNORMAL HIGH (ref 8–27)
Bilirubin Total: 0.8 mg/dL (ref 0.0–1.2)
CO2: 25 mmol/L (ref 20–29)
Calcium: 8.6 mg/dL (ref 8.6–10.2)
Chloride: 98 mmol/L (ref 96–106)
Creatinine, Ser: 1.77 mg/dL — ABNORMAL HIGH (ref 0.76–1.27)
GFR calc Af Amer: 42 mL/min/{1.73_m2} — ABNORMAL LOW (ref >59)
GFR calc non Af Amer: 36 mL/min/{1.73_m2} — ABNORMAL LOW (ref >59)
Globulin, Total: 3.1 g/dL (ref 1.5–4.5)
Glucose: 121 mg/dL — ABNORMAL HIGH (ref 65–99)
Potassium: 3.9 mmol/L (ref 3.5–5.2)
Sodium: 138 mmol/L (ref 134–144)
Total Protein: 6.9 g/dL (ref 6.0–8.5)

## 2019-04-06 MED ORDER — APIXABAN 5 MG PO TABS: 5.0000 mg | ORAL_TABLET | Freq: Two times a day (BID) | ORAL | 3 refills | Status: DC

## 2019-04-06 NOTE — Telephone Encounter (Addendum)
Spoke with pt, he vocied understanding and repeated new directions. New script sent to the pharmacy.  ----- Message from Lelon Perla, MD sent at 04/06/2019  7:31 AM EST ----- Apixaban dose should be 5 mg BID [facility]

## 2019-04-08 ENCOUNTER — Other Ambulatory Visit: Payer: Self-pay | Admitting: Internal Medicine

## 2019-04-09 ENCOUNTER — Other Ambulatory Visit: Payer: Self-pay | Admitting: Internal Medicine

## 2019-04-11 ENCOUNTER — Encounter: Payer: Self-pay | Admitting: Internal Medicine

## 2019-04-13 ENCOUNTER — Ambulatory Visit: Payer: Medicare Other | Admitting: Internal Medicine

## 2019-04-14 ENCOUNTER — Other Ambulatory Visit: Payer: Self-pay

## 2019-04-14 ENCOUNTER — Ambulatory Visit (INDEPENDENT_AMBULATORY_CARE_PROVIDER_SITE_OTHER): Payer: Medicare Other | Admitting: Internal Medicine

## 2019-04-14 ENCOUNTER — Encounter: Payer: Self-pay | Admitting: Internal Medicine

## 2019-04-14 VITALS — BP 138/84 | HR 67 | Temp 98.0°F | Ht 70.0 in

## 2019-04-14 DIAGNOSIS — L03116 Cellulitis of left lower limb: Secondary | ICD-10-CM | POA: Diagnosis not present

## 2019-04-14 DIAGNOSIS — L0390 Cellulitis, unspecified: Secondary | ICD-10-CM | POA: Insufficient documentation

## 2019-04-14 MED ORDER — CEPHALEXIN 500 MG PO CAPS: 500.0000 mg | ORAL_CAPSULE | Freq: Two times a day (BID) | ORAL | 0 refills | Status: DC

## 2019-04-14 NOTE — Patient Instructions (Addendum)
We have sent in the keflex to take twice a day for 2 weeks.   Let us know if this is not working or is not long enough.

## 2019-04-14 NOTE — Progress Notes (Signed)
   Subjective:   Patient ID: [name], male    DOB: 06/22/1940, 78 y.o.   MRN: [id number]  HPI The patient is a 78 YO man coming in for rash and wound and likely cellulitis on the left leg. He did get cellulitis over the summer in the same area. He had a loose skin piece which he peeled off which turned into a wound. He then started getting redness around this. Denies fevers or chills. There is some pain in the area but not severe. Overall the redness is spreading. Started 1 week or so ago. Has tried some diclofenac gel for pain which helped.   Review of Systems  Constitutional: Negative.   HENT: Negative.   Eyes: Negative.   Respiratory: Negative for cough, chest tightness and shortness of breath.   Cardiovascular: Negative for chest pain, palpitations and leg swelling.  Gastrointestinal: Negative for abdominal distention, abdominal pain, constipation, diarrhea, nausea and vomiting.  Musculoskeletal: Negative.   Skin: Positive for color change, rash and wound.  Neurological: Negative.   Psychiatric/Behavioral: Negative.     Objective:  Physical Exam Constitutional:      Appearance: He is well-developed.     Comments: Appears thin  HENT:     Head: Normocephalic and atraumatic.  Cardiovascular:     Rate and Rhythm: Normal rate and regular rhythm.  Pulmonary:     Effort: Pulmonary effort is normal. No respiratory distress.     Breath sounds: Normal breath sounds. No wheezing or rales.  Abdominal:     General: Bowel sounds are normal. There is no distension.     Palpations: Abdomen is soft.     Tenderness: There is no abdominal tenderness. There is no rebound.  Musculoskeletal:     Cervical back: Normal range of motion.  Skin:    General: Skin is warm and dry.     Findings: Erythema and rash present.     Comments: Left leg with redness from the ankle to just below the knee with wound about 4 cm by 3 cm circular with granulation tissue covering. Unable to stage.    Neurological:     Mental Status: He is alert and oriented to person, place, and time.     Coordination: Coordination abnormal.     Comments: wheelchair     Vitals:   04/14/19 1116  BP: 138/84  Pulse: 67  Temp: 98 F (36.7 C)  TempSrc: Oral  SpO2: 97%  Height: 5\' 10"  (1.778 m)    This visit occurred during the SARS-CoV-2 public health emergency.  Safety protocols were in place, including screening questions prior to the visit, additional usage of staff PPE, and extensive cleaning of exam room while observing appropriate contact time as indicated for disinfecting solutions.   Assessment & Plan:

## 2019-04-14 NOTE — Assessment & Plan Note (Signed)
Rx keflex for 2 weeks, if no improvement or worsening will change antibiotics. If at end of course improving but not gone will extend course.

## 2019-04-23 ENCOUNTER — Other Ambulatory Visit: Payer: Self-pay | Admitting: Internal Medicine

## 2019-04-25 ENCOUNTER — Other Ambulatory Visit: Payer: Self-pay | Admitting: Internal Medicine

## 2019-04-26 ENCOUNTER — Encounter: Payer: Self-pay | Admitting: Internal Medicine

## 2019-04-27 ENCOUNTER — Other Ambulatory Visit: Payer: Self-pay | Admitting: Internal Medicine

## 2019-04-27 NOTE — Telephone Encounter (Signed)
Requested medication (s) are due for refill today: yes  Requested medication (s) are on the active medication list: yes  Last refill:  02/25/2019  Future visit scheduled: no  Notes to clinic:  This refill cannot be delegated   Requested Prescriptions  Pending Prescriptions Disp Refills   predniSONE (DELTASONE) 20 MG tablet 14 tablet 0    Sig: Take 2 tablets (40 mg total) by mouth daily with breakfast.      Not Delegated - Endocrinology:  Oral Corticosteroids Failed - 04/27/2019  3:04 PM      Failed - This refill cannot be delegated      Passed - Last BP in normal range    BP Readings from Last 1 Encounters:  04/14/19 138/84          Passed - Valid encounter within last 6 months    Recent Outpatient Visits           3 months ago Acute renal failure superimposed on stage 3 chronic kidney disease, unspecified acute renal failure type ([place])   [place], Elizabeth A, MD   4 months ago Anemia, unspecified type   [place], Clements, MD   9 months ago Closed left hip fracture, initial encounter [facility])   [place], Elizabeth A, MD   1 year ago Anemia, unspecified type   [place], Elizabeth A, MD   1 year ago Chronic diastolic heart failure [facility])   [place] Kordsmeier, Julia, FNP

## 2019-04-27 NOTE — Telephone Encounter (Signed)
Copied from [place] [id number]. Topic: Quick Communication - Rx Refill/Question >> Apr 27, 2019  2:53 PM Rainey Pines A wrote: Medication: predniSONE (DELTASONE) 20 MG tablet  Has the patient contacted their pharmacy? Yes (Agent: If no, request that the patient contact the pharmacy for the refill.) (Agent: If yes, when and what did the pharmacy advise?)Contact PCP  [facility]erred Pharm[facility] (with phone number [facility] street [id number] [place], [place].  Phone:  [phone] Fax:  [phone]     Agent: Please be advised that RX refills may take up to 3 business days. We ask that you follow-up with your pharmacy.

## 2019-04-28 ENCOUNTER — Encounter: Payer: Self-pay | Admitting: Internal Medicine

## 2019-04-28 MED ORDER — SULFAMETHOXAZOLE-TRIMETHOPRIM 800-160 MG PO TABS: 1.0000 | ORAL_TABLET | Freq: Two times a day (BID) | ORAL | 0 refills | Status: DC

## 2019-04-29 DIAGNOSIS — M5137 Other intervertebral disc degeneration, lumbosacral region: Secondary | ICD-10-CM | POA: Diagnosis not present

## 2019-04-29 DIAGNOSIS — M25551 Pain in right hip: Secondary | ICD-10-CM | POA: Diagnosis not present

## 2019-04-29 DIAGNOSIS — I739 Peripheral vascular disease, unspecified: Secondary | ICD-10-CM | POA: Diagnosis not present

## 2019-04-29 DIAGNOSIS — G894 Chronic pain syndrome: Secondary | ICD-10-CM | POA: Diagnosis not present

## 2019-04-29 DIAGNOSIS — M545 Low back pain: Secondary | ICD-10-CM | POA: Diagnosis not present

## 2019-05-05 ENCOUNTER — Telehealth: Payer: Self-pay | Admitting: *Deleted

## 2019-05-05 NOTE — Telephone Encounter (Signed)
A message was left, re: his follow up visit. 

## 2019-05-11 ENCOUNTER — Encounter: Payer: Self-pay | Admitting: Internal Medicine

## 2019-05-11 MED ORDER — SULFAMETHOXAZOLE-TRIMETHOPRIM 800-160 MG PO TABS: 1.0000 | ORAL_TABLET | Freq: Two times a day (BID) | ORAL | 0 refills | Status: DC

## 2019-05-18 ENCOUNTER — Encounter: Payer: Self-pay | Admitting: Internal Medicine

## 2019-05-19 ENCOUNTER — Telehealth: Payer: Self-pay

## 2019-05-19 NOTE — Telephone Encounter (Signed)
Is this refill appropriate in regard to the MyChart message?

## 2019-05-19 NOTE — Telephone Encounter (Signed)
New message    1. Which medications need to be refilled? (please list name of each medication and dose if known) sulfamethoxazole-trimethoprim (BACTRIM DS) 800-160 MG tablet  2. Which pharmacy/location (including street and city if local pharmacy) [facility] [address]   3. Do they need a 30 day or 90 day supply? 30 day supply

## 2019-05-20 ENCOUNTER — Telehealth: Payer: Self-pay

## 2019-05-20 ENCOUNTER — Ambulatory Visit (INDEPENDENT_AMBULATORY_CARE_PROVIDER_SITE_OTHER): Payer: Medicare Other | Admitting: Internal Medicine

## 2019-05-20 ENCOUNTER — Encounter: Payer: Self-pay | Admitting: Internal Medicine

## 2019-05-20 ENCOUNTER — Other Ambulatory Visit: Payer: Self-pay

## 2019-05-20 VITALS — BP 160/62 | HR 62 | Temp 97.9°F | Ht 70.0 in | Wt 162.0 lb

## 2019-05-20 DIAGNOSIS — L03116 Cellulitis of left lower limb: Secondary | ICD-10-CM | POA: Diagnosis not present

## 2019-05-20 DIAGNOSIS — G8929 Other chronic pain: Secondary | ICD-10-CM

## 2019-05-20 DIAGNOSIS — M549 Dorsalgia, unspecified: Secondary | ICD-10-CM | POA: Diagnosis not present

## 2019-05-20 MED ORDER — PREDNISONE 20 MG PO TABS: 40.0000 mg | ORAL_TABLET | Freq: Every day | ORAL | 0 refills | Status: DC

## 2019-05-20 MED ORDER — SULFAMETHOXAZOLE-TRIMETHOPRIM 800-160 MG PO TABS: 1.0000 | ORAL_TABLET | Freq: Two times a day (BID) | ORAL | 0 refills | Status: DC

## 2019-05-20 NOTE — Patient Instructions (Signed)
We have sent in the prednisone to fill when needed.   We have sent in the bactrim also that you can fill and take if the leg is getting red or hurting or swelling more.

## 2019-05-20 NOTE — Progress Notes (Signed)
   Subjective:   Patient ID: [name], male    DOB: 1941/01/25, 79 y.o.   MRN: [id number]  HPI The patient is a 79 YO man coming in for concerns about ongoing cellulitis. Seen about 1 month ago and had recurrent cellulitis at that time. Given bactrim course and this was refilled for resolving but persistent symptoms. He called back again with persistent symptoms so we asked to see him. Denies fevers or chills. Still having swelling in the leg. Overall he is doing better. He does have a little pink to purple color on the leg without pain. There is still a scab on the leg stable. No purulent drainage.   Review of Systems  Constitutional: Negative.   HENT: Negative.   Eyes: Negative.   Respiratory: Negative for cough, chest tightness and shortness of breath.   Cardiovascular: Negative for chest pain, palpitations and leg swelling.  Gastrointestinal: Negative for abdominal distention, abdominal pain, constipation, diarrhea, nausea and vomiting.  Musculoskeletal: Negative.   Skin: Positive for wound.  Neurological: Negative.   Psychiatric/Behavioral: Negative.     Objective:  Physical Exam Constitutional:      Appearance: He is well-developed.  HENT:     Head: Normocephalic and atraumatic.  Cardiovascular:     Rate and Rhythm: Normal rate and regular rhythm.  Pulmonary:     Effort: Pulmonary effort is normal. No respiratory distress.     Breath sounds: Normal breath sounds. No wheezing or rales.  Abdominal:     General: Bowel sounds are normal. There is no distension.     Palpations: Abdomen is soft.     Tenderness: There is no abdominal tenderness. There is no rebound.  Musculoskeletal:     Cervical back: Normal range of motion.     Comments: Left leg appears improved and some pink color change to the skin superior to the wound on the leg.   Skin:    General: Skin is warm and dry.  Neurological:     Mental Status: He is alert and oriented to person, place, and time.   Coordination: Coordination normal.     Vitals:   05/20/19 1603  BP: (!) 160/62  Pulse: 62  Temp: 97.9 F (36.6 C)  TempSrc: Oral  SpO2: 98%  Weight: 162 lb (73.5 kg)  Height: 5\' 10"  (1.778 m)    This visit occurred during the SARS-CoV-2 public health emergency.  Safety protocols were in place, including screening questions prior to the visit, additional usage of staff PPE, and extensive cleaning of exam room while observing appropriate contact time as indicated for disinfecting solutions.   Assessment & Plan:

## 2019-05-20 NOTE — Telephone Encounter (Signed)
Beth with [employer] calling and is checking the status of a written order request for an ankle brace. States that it was originally faxed over on 05/18/2019 and just wanted to see if we'd received the fax.

## 2019-05-20 NOTE — Telephone Encounter (Addendum)
Talked to patient and agreed to fill this out. I do not know if we have these forms or not.

## 2019-05-20 NOTE — Assessment & Plan Note (Signed)
Rx prednisone to keep on hand for flare only.

## 2019-05-20 NOTE — Assessment & Plan Note (Signed)
Appears to be resolved. Will send in bactrim for him to fill and keep on hand in case redness and pain and swelling returns and then notify us.

## 2019-05-23 NOTE — Telephone Encounter (Signed)
The form was placed in your yellow folder this morning.

## 2019-05-26 NOTE — Telephone Encounter (Signed)
[employer] form has been faxed back

## 2019-05-27 DIAGNOSIS — I739 Peripheral vascular disease, unspecified: Secondary | ICD-10-CM | POA: Diagnosis not present

## 2019-05-27 DIAGNOSIS — M545 Low back pain: Secondary | ICD-10-CM | POA: Diagnosis not present

## 2019-05-27 DIAGNOSIS — M5137 Other intervertebral disc degeneration, lumbosacral region: Secondary | ICD-10-CM | POA: Diagnosis not present

## 2019-05-27 DIAGNOSIS — M25551 Pain in right hip: Secondary | ICD-10-CM | POA: Diagnosis not present

## 2019-05-27 DIAGNOSIS — G894 Chronic pain syndrome: Secondary | ICD-10-CM | POA: Diagnosis not present

## 2019-05-30 ENCOUNTER — Other Ambulatory Visit: Payer: Self-pay | Admitting: Internal Medicine

## 2019-05-31 ENCOUNTER — Telehealth: Payer: Self-pay

## 2019-05-31 NOTE — Telephone Encounter (Signed)
Addendum   Left message on home voice mail per recommendation from nurse office visit or virtual appt is needed.

## 2019-05-31 NOTE — Telephone Encounter (Signed)
   [employer] calling to request additional supporting clinical documentation regarding left ankle:reason for ankle brace  Please fax to [phone]

## 2019-05-31 NOTE — Telephone Encounter (Signed)
New message      1. Which medications need to be refilled? (please list name of each medication and dose if known) predniSONE (DELTASONE) 20 MG tablet  2. Which pharmacy/location (including street and city if local pharmacy) is medication to be sent to? [facility] [address]  3. Do they need a 30 day or 90 day supply?

## 2019-05-31 NOTE — Telephone Encounter (Signed)
Form was received yesterday for PCP review. Once I have instruction from PCP it will be returned.

## 2019-06-01 NOTE — Telephone Encounter (Signed)
Per PCP instruction the request was sent to the medical records department. [employer] has been informed.

## 2019-06-03 ENCOUNTER — Encounter: Payer: Self-pay | Admitting: Internal Medicine

## 2019-06-03 ENCOUNTER — Ambulatory Visit (INDEPENDENT_AMBULATORY_CARE_PROVIDER_SITE_OTHER): Payer: Medicare Other | Admitting: Internal Medicine

## 2019-06-03 ENCOUNTER — Other Ambulatory Visit: Payer: Self-pay

## 2019-06-03 ENCOUNTER — Ambulatory Visit: Payer: Medicare Other | Admitting: Family

## 2019-06-03 DIAGNOSIS — G8929 Other chronic pain: Secondary | ICD-10-CM

## 2019-06-03 DIAGNOSIS — M549 Dorsalgia, unspecified: Secondary | ICD-10-CM

## 2019-06-03 MED ORDER — PREDNISONE 10 MG PO TABS: 10.0000 mg | ORAL_TABLET | Freq: Every day | ORAL | 0 refills | Status: DC

## 2019-06-03 NOTE — Progress Notes (Signed)
Virtual Visit via Telephone Note  I connected with [name] on 06/03/19 at  3:15 PM EST by telephone and verified that I am speaking with the correct person using two identifiers.   I discussed the limitations of evaluation and management by telemedicine and the availability of in person appointments. The patient expressed understanding and agreed to proceed.  Present for the visit:  Myself, Dr [name], [name].  The patient is currently at home and I am in the office.    No referring provider.    History of Present Illness: This is an acute visit for increased pain.   He has pain in every part of his body for years.  He sees Dr [name] for pain management.  He is seeing Dr. Trenton Mcdowell with neurosurgery and he is not able to operate on him.  He recently was prescribed 5 days of prednisone and it did help with his pain and swelling in his joints.  He did request a refill of prednisone through my chart, but it was denied.  He was advised to follow-up with his PCP to discuss.  He would like to consider more of a chronic dosing of prednisone.  He understands some of the risks.  The medication has helped him and he is in a great deal of pain.  He is taking oxycodone, but that does not touch all of his pain like the prednisone did.  He is calling today to see if he can get a prescription for prednisone.  When he was taking the prednisone he was not taking on a daily basis, but would take it as needed and did help.   He has no other concerns.   Social History   Socioeconomic History  . Marital status: Widowed    Spouse name: Not on file  . Number of children: Not on file  . Years of education: Not on file  . Highest education level: Not on file  Occupational History  . Not on file  Tobacco Use  . Smoking status: Former Research scientist (life sciences)  . Smokeless tobacco: Never Used  Substance and Sexual Activity  . Alcohol use: No    Alcohol/week: 0.0 standard drinks    Comment: History of heavy  alcohol use per pt. Quit many years ago1/1/ 2005  . Drug use: No  . Sexual activity: Not Currently  Other Topics Concern  . Not on file  Social History Narrative   He lives alone here in [place].  He has 1 son, who lives in the [place],    Social Determinants of Health   Financial Resource Strain:   . Difficulty of Paying Living Expenses: Not on file  Food Insecurity:   . Worried About Charity fundraiser in the Last Year: Not on file  . Ran Out of Food in the Last Year: Not on file  Transportation Needs:   . Lack of Transportation (Medical): Not on file  . Lack of Transportation (Non-Medical): Not on file  Physical Activity:   . Days of Exercise per Week: Not on file  . Minutes of Exercise per Session: Not on file  Stress:   . Feeling of Stress : Not on file  Social Connections:   . Frequency of Communication with Friends and Family: Not on file  . Frequency of Social Gatherings with Friends and Family: Not on file  . Attends Religious Services: Not on file  . Active Member of Clubs or Organizations: Not on file  . Attends  Club or Organization Meetings: Not on file  . Marital Status: Not on file     Assessment and Plan:  Chronic pain: He has been dealing with chronic pain for a while and it sounds like it is pretty extensive He follows with chronic pain management and is taking oxycodone He is interested in prednisone to help with some of his pain Discussed some of the side effects and concerns with chronic prednisone use Discussed that if he wants this to be a more chronic prescription he will need to discuss this with his primary care physician since she will be the one who is prescribing the medication  I will go ahead and prescribe prednisone 10 mg daily for 7 days.  He can see how he does with the lower dose of the prednisone.  He will schedule a follow-up appointment with Dr. Sharlet Mcdowell to discuss further   See Problem List for Assessment and Plan of  chronic medical problems.   Follow Up Instructions:    I discussed the assessment and treatment plan with the patient. The patient was provided an opportunity to ask questions and all were answered. The patient agreed with the plan and demonstrated an understanding of the instructions.   The patient was advised to call back or seek an in-person evaluation if the symptoms worsen or if the condition fails to improve as anticipated.  Time spent on telephone call: 15 minutes  Binnie Rail, MD

## 2019-06-07 ENCOUNTER — Encounter: Payer: Self-pay | Admitting: Internal Medicine

## 2019-06-07 ENCOUNTER — Ambulatory Visit (INDEPENDENT_AMBULATORY_CARE_PROVIDER_SITE_OTHER): Payer: Medicare Other | Admitting: Internal Medicine

## 2019-06-07 DIAGNOSIS — N183 Chronic kidney disease, stage 3 unspecified: Secondary | ICD-10-CM

## 2019-06-07 DIAGNOSIS — M19049 Primary osteoarthritis, unspecified hand: Secondary | ICD-10-CM | POA: Diagnosis not present

## 2019-06-07 DIAGNOSIS — I2510 Atherosclerotic heart disease of native coronary artery without angina pectoris: Secondary | ICD-10-CM | POA: Diagnosis not present

## 2019-06-07 MED ORDER — PREDNISONE 10 MG PO TABS: 20.0000 mg | ORAL_TABLET | Freq: Every day | ORAL | 0 refills | Status: DC

## 2019-06-07 MED ORDER — FAMOTIDINE 40 MG PO TABS: 40.0000 mg | ORAL_TABLET | Freq: Every day | ORAL | 3 refills | Status: DC

## 2019-06-07 NOTE — Assessment & Plan Note (Signed)
Unable to use NSAIDs

## 2019-06-07 NOTE — Assessment & Plan Note (Addendum)
severe, pain 9/10 in B hands -likely inflammatory - severe pain and stiffness in both hands.  He rates his pain at 9-10 out of 10 in intensity.  There is a lot of stiffness.  He is unable to open a jar or can or bottle.  Dr. Sharlet Salina gave him 10 mg of prednisone twice a day and he felt wonderful on it.  It was like day and night.  The pain really occurred when he ran out of it.  She saw Dr. Quay Burow and she gave him prescription for 10 mg a day.  He states 10 mg did not help him. I will give him a prescription for Medrol 20 mg every morning with food.  Famotidine daily for gastric lining protection.  I asked him to reduce the dose down to 50 mg a day if tolerated and if he is seeing a positive effect from the lower dose.  He will continue with vitamin D.  Potential benefits of a long term steroid  use as well as potential risks  and complications were explained to the patient and were aknowledged.

## 2019-06-07 NOTE — Progress Notes (Signed)
Virtual Visit via Telephone Note  I connected with [name] on 06/07/19 at 11:20 AM EST by telephone and verified that I am speaking with the correct person using two identifiers.   I discussed the limitations, risks, security and privacy concerns of performing an evaluation and management service by telephone and the availability of in person appointments. I also discussed with the patient that there may be a patient responsible charge related to this service. The patient expressed understanding and agreed to proceed.  It appears that his quality of life is terrible with the amount of hand pain that he has now.   History of Present Illness:   The patient is complaining of severe pain and stiffness in both hands.  He rates his pain at 9-10 out of 10 in intensity.  There is a lot of stiffness.  He is unable to open a jar or can or bottle.  Dr. Sharlet Salina gave him 10 mg of prednisone twice a day and he felt wonderful on it.  It was like day and night.  The pain really occurred when he ran out of it. He saw Dr. Quay Burow and she gave him prescription for prednisone 10 mg a day.  He states 10 mg did not help him. He did not have any side effects with prednisone as far as he can tell.   Observations/Objective:  The patient sounds normal on the phone. Assessment and Plan: See plan  Follow Up Instructions:    I discussed the assessment and treatment plan with the patient. The patient was provided an opportunity to ask questions and all were answered. The patient agreed with the plan and demonstrated an understanding of the instructions.   The patient was advised to call back or seek an in-person evaluation if the symptoms worsen or if the condition fails to improve as anticipated.  I provided 22 minutes of non-face-to-face time during this encounter.   Walker Kehr, MD

## 2019-06-24 ENCOUNTER — Telehealth: Payer: Self-pay

## 2019-06-24 ENCOUNTER — Other Ambulatory Visit: Payer: Self-pay | Admitting: *Deleted

## 2019-06-24 DIAGNOSIS — I739 Peripheral vascular disease, unspecified: Secondary | ICD-10-CM | POA: Diagnosis not present

## 2019-06-24 DIAGNOSIS — M25551 Pain in right hip: Secondary | ICD-10-CM | POA: Diagnosis not present

## 2019-06-24 DIAGNOSIS — M545 Low back pain: Secondary | ICD-10-CM | POA: Diagnosis not present

## 2019-06-24 DIAGNOSIS — M5137 Other intervertebral disc degeneration, lumbosacral region: Secondary | ICD-10-CM | POA: Diagnosis not present

## 2019-06-24 DIAGNOSIS — G894 Chronic pain syndrome: Secondary | ICD-10-CM | POA: Diagnosis not present

## 2019-06-24 MED ORDER — POTASSIUM CHLORIDE CRYS ER 20 MEQ PO TBCR: 20.0000 meq | EXTENDED_RELEASE_TABLET | Freq: Every day | ORAL | 0 refills | Status: DC

## 2019-06-24 NOTE — Telephone Encounter (Signed)
KLOR-CON M20 TABLET. TAKE 2 TABLETS BY MOUTH 2 TIMES DAILY.  # 180.  LAST FILLED: 02/04/2019.

## 2019-06-24 NOTE — Telephone Encounter (Signed)
Spoke with patient who clarified he takes 20 meq 1 tab daily. Refill as such

## 2019-06-27 ENCOUNTER — Telehealth: Payer: Self-pay

## 2019-06-27 NOTE — Telephone Encounter (Signed)
Patient calling and states that he was seen about 3 or so weeks ago for Cellulitis. States that he was told to stay off his feet. States that he did so, but now it seems to have come back. Would like to know if the sulfamethoxazole-trimethoprim (BACTRIM DS) 800-160 MG tablet   could be refilled? He has 4 pills left.  Also he states that he has been having some nose bleeds here lately. States that they are difficult to stop, but he puts toilet paper up his nose and it stops after a little bit. Patient is on eloquis. Not currently having a nose bleed. Please advise.

## 2019-06-27 NOTE — Telephone Encounter (Signed)
Called and spoke with patient. States that he has to see what his son's schedule is and he would call us back.

## 2019-06-28 ENCOUNTER — Ambulatory Visit (INDEPENDENT_AMBULATORY_CARE_PROVIDER_SITE_OTHER): Payer: Medicare Other | Admitting: Internal Medicine

## 2019-06-28 ENCOUNTER — Other Ambulatory Visit: Payer: Self-pay

## 2019-06-28 ENCOUNTER — Encounter: Payer: Self-pay | Admitting: Internal Medicine

## 2019-06-28 VITALS — BP 126/82 | HR 63 | Temp 98.0°F | Ht 70.0 in

## 2019-06-28 DIAGNOSIS — L03116 Cellulitis of left lower limb: Secondary | ICD-10-CM

## 2019-06-28 DIAGNOSIS — R2681 Unsteadiness on feet: Secondary | ICD-10-CM

## 2019-06-28 DIAGNOSIS — I5032 Chronic diastolic (congestive) heart failure: Secondary | ICD-10-CM | POA: Diagnosis not present

## 2019-06-28 MED ORDER — SULFAMETHOXAZOLE-TRIMETHOPRIM 800-160 MG PO TABS: 1.0000 | ORAL_TABLET | Freq: Two times a day (BID) | ORAL | 0 refills | Status: DC

## 2019-06-28 NOTE — Patient Instructions (Signed)
We have sent in the 2 weeks of the bactrim and call us 3-4 days left let us know. Send [place] a mychart or call to let us know.

## 2019-06-28 NOTE — Assessment & Plan Note (Signed)
Needs [facility] referral to help with resources and PT evaluation for scooter to help with mobility within the mobile home.

## 2019-06-28 NOTE — Progress Notes (Signed)
   Subjective:   Patient ID: [name], male    DOB: 03-04-1941, 79 y.o.   MRN: [id number]  HPI The patient is a 79 YO man coming in for concerns about recurrent cellulitis on the legs. Has been on both right and left legs in the past. Recently he had a fluid filled blister on the right leg which he popped. Now he has scab and redness around that area. Denies fevers or chills. Left leg has also some redness which is worsening since he has been off antibiotics for about 2-3 weeks now. Denies significant swelling in the legs.   Review of Systems  Constitutional: Negative.   HENT: Negative.   Eyes: Negative.   Respiratory: Negative for cough, chest tightness and shortness of breath.   Cardiovascular: Negative for chest pain, palpitations and leg swelling.  Gastrointestinal: Negative for abdominal distention, abdominal pain, constipation, diarrhea, nausea and vomiting.  Musculoskeletal: Negative.   Skin: Positive for color change and wound.  Neurological: Negative.   Psychiatric/Behavioral: Negative.     Objective:  Physical Exam Constitutional:      Appearance: He is well-developed.  HENT:     Head: Normocephalic and atraumatic.  Cardiovascular:     Rate and Rhythm: Normal rate and regular rhythm.  Pulmonary:     Effort: Pulmonary effort is normal. No respiratory distress.     Breath sounds: Normal breath sounds. No wheezing or rales.  Abdominal:     General: Bowel sounds are normal. There is no distension.     Palpations: Abdomen is soft.     Tenderness: There is no abdominal tenderness. There is no rebound.  Musculoskeletal:        General: Tenderness present.     Cervical back: Normal range of motion.  Skin:    General: Skin is warm and dry.     Findings: Rash present.     Comments: Wound with scab covering about 1-2 cm circular lower right leg with surrounding redness about 3-4 cm above and below circumferential. Left leg with redness from the ankle to mid-shin with  some heat in the wound.   Neurological:     Mental Status: He is alert and oriented to person, place, and time.     Coordination: Coordination normal.     Vitals:   06/28/19 1407  BP: 126/82  Pulse: 63  Temp: 98 F (36.7 C)  TempSrc: Oral  SpO2: 97%  Height: 5\' 10"  (1.778 m)    This visit occurred during the SARS-CoV-2 public health emergency.  Safety protocols were in place, including screening questions prior to the visit, additional usage of staff PPE, and extensive cleaning of exam room while observing appropriate contact time as indicated for disinfecting solutions.   Assessment & Plan:

## 2019-06-28 NOTE — Assessment & Plan Note (Signed)
Rx bactrim 2 week course and he will call or mychart close to when out and may need to be extended.

## 2019-06-29 ENCOUNTER — Other Ambulatory Visit: Payer: Self-pay | Admitting: Internal Medicine

## 2019-07-06 ENCOUNTER — Other Ambulatory Visit: Payer: Self-pay | Admitting: Internal Medicine

## 2019-07-07 ENCOUNTER — Other Ambulatory Visit: Payer: Self-pay

## 2019-07-07 NOTE — Patient Outreach (Signed)
Screening:   Referral source: MD office Reason for referral: community resources- scooter- transportation- ramp  Placed call to patient who reports he needs help in his home. With mobility. Reports currently lives in a mobile home and is trying to walk about in his home. Reports difficulty. States he has been using a walker in every room because his doorways are narrow. Reports no recent falls in the last 3 month. Reports he has COPD and gets winded very easily when walking.  Reports he needs a scooter and a ramp. Reports recent mold in his home.   PLAN: placed order for [facility] social worker to assist with resources and home repair.  Sent an inbasket message to MD as patient will need a PT evaluation and order from MD for evaluation of need for scooter.  No nursing needs identified at this time. Patient reports that he is taking all medications as prescribed, knows when to call MD and understands his COPD.   Tomasa Rand, RN, BSN, CEN [facility] [employer] [phone]

## 2019-07-08 ENCOUNTER — Encounter: Payer: Self-pay | Admitting: *Deleted

## 2019-07-08 ENCOUNTER — Other Ambulatory Visit: Payer: Self-pay | Admitting: *Deleted

## 2019-07-08 NOTE — Patient Outreach (Signed)
[facility]) Care Management  07/08/2019  DAYNA EIBEN March 24, 1941 [id number]   CSW was able to make initial contact with patient today to perform phone assessment, as well as assess and assist with social work needs and services.  CSW introduced self, explained role and types of services provided through [facility] [facility] ([place] [facility]).  CSW further explained to patient that CSW works with patient's RNCM, also with [place] [facility], Tomasa Rand.  CSW then explained the reason for the call, indicating that Ms. Lacinda Axon thought that patient would benefit from social work services and resources to assist with having a wheelchair ramp installed at his home, assist with home repairs, as well as assist patient with obtaining a motorized scooter for home use.  CSW obtained two HIPAA compliant identifiers from patient, which included patient's name and date of birth.  Patient admitted that he has already been in contact with several agencies regarding installation of a wheelchair ramp and home repairs.  Patient went on to say that he has submitted an application to [facility], about a week ago, and that he is currently awaiting a response as to whether or not he will be eligible to receive services.  CSW also provided patient with the following list of resources:  Arts administrator at [employer]; [facility] [employer]; Arts administrator at [employer]; [employer].  Patient agreed to begin calling these four new resources today, prepared to report findings to CSW when we speak again next week.  CSW will continue to research additional agencies, as it sounds as though patient will need some pretty extensive repair work done to his trailer.  CSW agreed to assist patient with obtaining a motorized scooter, once he has been able to obtain a written order from his Primary Care Physician, Dr.  [name], in addition to being evaluated by a home health physical therapist to determine medical necessity.  Patient admitted to feeling confined to his home, wanting to at least be able to sit out in his front yard, go to [employer], go to the grocery store, etc.  Patient currently uses a rolling walker to assist with ambulation but suffers a great deal of pain and instability in his hips.  Patient reported having a great support system, through family members, friends and church community, relying on his family to assist him with light housekeeping duties, meal preparation, grocery shopping, yard work, Medical sales representative, changing bed linens, picking up medications from the pharmacy, etc.  Patient admitted to being able to afford to pay for all of his prescription medications and prides himself in taking his medications exactly as prescribed.  Patient currently relies on his son, Justus, [place]. to transport him to and from all of his physician appointments.  Patient denies having food insecurities and already has his Advanced Directives (Living Will and [place] documents) in place.  CSW agreed to follow-up with patient again next week, on Friday, July 15, 2019 to check progress on outreach calls to various agencies requesting financial assistance with wheelchair ramp installation and home repairs.  CSW was able to confirm that patient has the correct contact information for CSW, encouraging patient to contact CSW directly if additional social work needs arise in the meantime.  Patient voiced understanding.  [name], BSW, MSW, LCSW  Licensed Education officer, environmental Health System  Mailing [place] [address] [place]  [address] [place], [place] [address] Physical Address-[address], [place], [place] [address] Toll Free Main # [phone] Fax # [phone] Cell # [phone]  Office #  [phone] [email or web address]

## 2019-07-11 ENCOUNTER — Encounter: Payer: Self-pay | Admitting: Internal Medicine

## 2019-07-11 ENCOUNTER — Telehealth: Payer: Self-pay

## 2019-07-11 NOTE — Telephone Encounter (Signed)
New message    The patient verbalized how should he treated the blister should it be applied with peroxide or just monitor the blister.

## 2019-07-13 ENCOUNTER — Ambulatory Visit: Payer: Medicare Other | Admitting: *Deleted

## 2019-07-13 NOTE — Telephone Encounter (Signed)
Mychart message in chart addressing this.

## 2019-07-14 NOTE — Telephone Encounter (Signed)
Pt contacted and he stated the blister is looking better. Pt stated that he finished the last antibiotic today and will wait for Dr. Sharlet Salina to return and see if she advises to take another round of antibiotics.

## 2019-07-15 ENCOUNTER — Other Ambulatory Visit: Payer: Self-pay | Admitting: *Deleted

## 2019-07-15 NOTE — Patient Outreach (Signed)
[facility]) Care Management  07/15/2019  RYN SKUBAL 07/22/1940 [id number]   CSW received a return call from patient, in response to the HIPAA compliant message left on voicemail for patient by CSW, just minutes prior.  Patient admitted to having called the list of agencies provided to him by CSW, to try and obtain financial assistance to install a which ramp for home use, but without success.  CSW also admitted to calling all of the following agencies:  Arts administrator at [employer]; [facility] [employer]; [facility] [employer] at [employer]; [employer], leaving a HIPAA compliant message on voicemail, never actually able to speak with a live person.  CSW agreed to continue in her efforts, while patient agreed to also try and contact [place] and [employer], at all three locations.  CSW then inquired about patient's order for a motorized scooter, agreeing to assist patient with trying to obtain one.  Patient admitted that he has not been able to obtain the order from his Primary Care Physician, Dr. [name], just yet, as he reported Dr. Sharlet Salina being on vacation this week.  Patient indicated that he has left a message on voicemail for the practice and is sure that Dr. Sharlet Salina, or her nurse, will return his call promptly, not seeing any dilemma with being able to receive the order.  Patient agreed to contact CSW if he is able to make any progress, and CSW agreed to do the same.  Otherwise, CSW will make arrangements to contact patient again on Tuesday, July 26, 2019, around 10:00am.  Patient voiced understanding and was agreeable to this plan.  [name], BSW, MSW, LCSW  Licensed Education officer, environmental Health System  Mailing [place] [address] [place], [place] [address] Physical Address-[address], [place], [place]  [address] Toll Free Main # [phone] Fax # [phone] Cell # [phone]  Office # [phone] [email or web address]

## 2019-07-15 NOTE — Patient Outreach (Signed)
[facility]) Care Management  07/15/2019  [name] Jan 23, 1941 [id number]   CSW made an attempt to try and contact patient today to follow-up regarding social work services and resources, as well as to ensure that patient was able to make contact with a few of these agencies that CSW provided to him last week.  However, patient was unavailable at the time of CSW's call.  CSW left a HIPAA compliant message on voicemail for patient and is currently awaiting a return call.  CSW will make a second outreach attempt within the next 3-4 business days, if a return call is not received from patient in the meantime.   [name], BSW, MSW, LCSW  Licensed Education officer, [facility]  Mailing [place] [address] [place], [place] [address] Physical Address-[address], [place], [place] [address] Toll Free Main # [phone] Fax # [phone] Cell # [phone]  Office # [phone] [email or web address]

## 2019-07-19 NOTE — Telephone Encounter (Signed)
Should not use peroxide as this breaks down forming new cells. Can use neosporin on this area.

## 2019-07-20 NOTE — Telephone Encounter (Signed)
Patient contacted and given PCP response/recommendation.   Pt stated that he is having some swelling of the left foot. Pt advise that we can schedule him for a visit.   Pt has been scheduled.

## 2019-07-21 ENCOUNTER — Encounter: Payer: Self-pay | Admitting: Internal Medicine

## 2019-07-21 ENCOUNTER — Other Ambulatory Visit: Payer: Self-pay

## 2019-07-21 ENCOUNTER — Ambulatory Visit (INDEPENDENT_AMBULATORY_CARE_PROVIDER_SITE_OTHER): Payer: Medicare Other | Admitting: Internal Medicine

## 2019-07-21 VITALS — BP 150/72 | HR 67 | Temp 97.9°F | Resp 16

## 2019-07-21 DIAGNOSIS — L03116 Cellulitis of left lower limb: Secondary | ICD-10-CM | POA: Diagnosis not present

## 2019-07-21 DIAGNOSIS — L97921 Non-pressure chronic ulcer of unspecified part of left lower leg limited to breakdown of skin: Secondary | ICD-10-CM

## 2019-07-21 DIAGNOSIS — L089 Local infection of the skin and subcutaneous tissue, unspecified: Secondary | ICD-10-CM | POA: Diagnosis not present

## 2019-07-21 DIAGNOSIS — T148XXA Other injury of unspecified body region, initial encounter: Secondary | ICD-10-CM | POA: Diagnosis not present

## 2019-07-21 DIAGNOSIS — L97211 Non-pressure chronic ulcer of right calf limited to breakdown of skin: Secondary | ICD-10-CM | POA: Insufficient documentation

## 2019-07-21 DIAGNOSIS — L97911 Non-pressure chronic ulcer of unspecified part of right lower leg limited to breakdown of skin: Secondary | ICD-10-CM | POA: Insufficient documentation

## 2019-07-21 MED ORDER — AMOXICILLIN-POT CLAVULANATE 875-125 MG PO TABS: 1.0000 | ORAL_TABLET | Freq: Two times a day (BID) | ORAL | 0 refills | Status: AC

## 2019-07-21 NOTE — Progress Notes (Signed)
Subjective:  Patient ID: [name], male    DOB: [birth year]  Age: 79 y.o. MRN: [id number]  CC: Recurrent Skin Infections  NEW TO ME  HPI [name] presents for concerns about his LLE.  He tells me that he was recently treated for cellulitis in the left lower extremity and it sounds like he just recently completed a course of Bactrim DS.  He got somewhat better but now says the symptoms are returning and are worse.  He has a painless ulcer on the left leg with associated redness, swelling, and blisters.  He has chronic unchanged shortness of breath and lung disease.  He denies fever, chills, night sweats, diarrhea, nausea, or vomiting.  Outpatient Medications Prior to Visit  Medication Sig Dispense Refill  . albuterol (VENTOLIN HFA) 108 (90 Base) MCG/ACT inhaler TAKE 2 PUFFS BY MOUTH EVERY 6 HOURS AS NEEDED FOR WHEEZE OR SHORTNESS OF BREATH 18 g 1  . apixaban (ELIQUIS) 5 MG TABS tablet Take 1 tablet (5 mg total) by mouth 2 (two) times daily. 180 tablet 3  . diclofenac Sodium (VOLTAREN) 1 % GEL     . famotidine (PEPCID) 40 MG tablet Take 1 tablet (40 mg total) by mouth daily. 90 tablet 3  . ferrous sulfate 325 (65 FE) MG tablet Take 1 tablet (325 mg total) by mouth 3 (three) times daily after meals for 14 days. (Patient not taking: Reported on 01/14/2019) 42 tablet 0  . finasteride (PROSCAR) 5 MG tablet TAKE 1 TABLET BY MOUTH EVERY DAY 90 tablet 1  . fluticasone (FLONASE) 50 MCG/ACT nasal spray PLACE 1 SPRAY INTO BOTH NOSTRILS EVERY MORNING. (Patient taking differently: Place 1 spray into both nostrils daily. ) 48 g 1  . furosemide (LASIX) 80 MG tablet Take 1 tablet (80 mg total) by mouth daily. 90 tablet 1  . Guaifenesin 1200 MG TB12 Take 1 tablet (1,200 mg total) by mouth 2 (two) times daily. (Patient taking differently: Take 1,200 mg by mouth 2 (two) times daily as needed (mucus). ) 180 each 3  . lactulose (CHRONULAC) 10 GM/15ML solution TAKE 30 MLS BY MOUTH 2  TIMES DAILY AS NEEDED FOR MILD CONSTIPATION OR MODERATE CONSTIPATION (Patient taking differently: Take 20 g by mouth 2 (two) times daily as needed for mild constipation. ) 1892 mL 6  . lidocaine (XYLOCAINE) 5 % ointment Apply 1 application topically as needed. (Patient taking differently: Apply 1 application topically as needed for mild pain. ) 240 g 6  . methocarbamol (ROBAXIN) 500 MG tablet Take 1 tablet (500 mg total) by mouth every 6 (six) hours as needed for muscle spasms. 40 tablet 0  . Naloxone HCl (NARCAN NA) Place 1 application into the nose once.    . nystatin ointment (MYCOSTATIN) APPLY TO AFFECTED AREA TWICE A DAY 30 g 3  . oxyCODONE (ROXICODONE) 30 MG immediate release tablet Take 1 tablet (30 mg total) by mouth every 6 (six) hours as needed for moderate pain or severe pain. 28 tablet 0  . potassium chloride SA (KLOR-CON M20) 20 MEQ tablet Take 1 tablet (20 mEq total) by mouth daily. 90 tablet 0  . pravastatin (PRAVACHOL) 20 MG tablet TAKE 1 TABLET BY MOUTH EVERY DAY 90 tablet 1  . predniSONE (DELTASONE) 10 MG tablet Take 2 tablets (20 mg total) by mouth daily with breakfast. 60 tablet 0  . sertraline (ZOLOFT) 25 MG tablet TAKE 1 TABLET BY MOUTH EVERYDAY AT BEDTIME 90  tablet 3  . spironolactone (ALDACTONE) 25 MG tablet TAKE 1 TABLET BY MOUTH EVERY DAY 90 tablet 1  . tamsulosin (FLOMAX) 0.4 MG CAPS capsule TAKE 1 CAPSULE BY MOUTH EVERY DAY 90 capsule 1  . sulfamethoxazole-trimethoprim (BACTRIM DS) 800-160 MG tablet Take 1 tablet by mouth 2 (two) times daily. 28 tablet 0   No facility-administered medications prior to visit.    ROS Review of Systems  Constitutional: Negative for appetite change, chills, diaphoresis, fatigue and fever.  HENT: Negative.   Eyes: Negative for visual disturbance.  Respiratory: Positive for shortness of breath. Negative for cough, chest tightness and wheezing.   Cardiovascular: Negative for chest pain, palpitations and leg swelling.  Gastrointestinal:  Negative for abdominal pain, diarrhea, nausea and vomiting.  Genitourinary: Negative.   Musculoskeletal: Negative.   Skin: Positive for color change and wound.  Neurological: Negative.  Negative for dizziness, weakness and light-headedness.  Hematological: Negative for adenopathy. Does not bruise/bleed easily.  Psychiatric/Behavioral: Negative.     Objective:  BP (!) 150/72 (BP Location: Left Arm, Patient Position: Sitting, Cuff Size: Normal)   Pulse 67   Temp 97.9 F (36.6 C) (Oral)   Resp 16   SpO2 98%   BP Readings from Last 3 Encounters:  07/21/19 (!) 150/72  06/28/19 126/82  05/20/19 (!) 160/62    Wt Readings from Last 3 Encounters:  05/20/19 162 lb (73.5 kg)  10/21/18 172 lb (78 kg)  10/13/18 172 lb (78 kg)    Physical Exam Constitutional:      General: He is not in acute distress.    Appearance: He is ill-appearing. He is not toxic-appearing or diaphoretic.     Comments: Frail, wheelchair-bound, ill-appearing male.  HENT:     Nose: Nose normal.     Mouth/Throat:     Mouth: Mucous membranes are moist.  Eyes:     General: No scleral icterus.    Conjunctiva/sclera: Conjunctivae normal.  Cardiovascular:     Rate and Rhythm: Normal rate and regular rhythm.     Heart sounds: No murmur.  Pulmonary:     Effort: No tachypnea or respiratory distress.     Breath sounds: Examination of the right-middle field reveals rhonchi. Examination of the left-middle field reveals rhonchi. Examination of the right-lower field reveals rhonchi. Examination of the left-lower field reveals rhonchi. Rhonchi present. No decreased breath sounds, wheezing or rales.  Musculoskeletal:        General: Normal range of motion.     Cervical back: Neck supple.     Left lower leg: Edema present.     Comments: LLE - There is a 3 cm, round, superficial ulcer over the medial aspect of the left lower leg.  This is filled with granulation tissue.  Surrounding this there is a confluent area of erythema,  warmth, and tenderness encompassing the entire left lower extremity extending onto the foot.  There are also a few bullae.  There is no induration, fluctuance, or purulent exudate.  See photo.  Lymphadenopathy:     Cervical: No cervical adenopathy.  Skin:    Findings: Erythema present. No rash.  Neurological:     General: No focal deficit present.     Mental Status: He is alert.     Lab Results  Component Value Date   WBC 7.4 01/14/2019   HGB 12.9 (L) 01/14/2019   HCT 40.1 01/14/2019   PLT 251 01/14/2019   GLUCOSE 121 (H) 04/05/2019   CHOL 125 03/16/2017   TRIG 96 03/16/2017  HDL 42 03/16/2017   LDLCALC 64 03/16/2017   ALT 18 04/05/2019   AST 26 04/05/2019   NA 138 04/05/2019   K 3.9 04/05/2019   CL 98 04/05/2019   CREATININE 1.77 (H) 04/05/2019   BUN 42 (H) 04/05/2019   CO2 25 04/05/2019   TSH 3.48 12/22/2014   PSA 10.64 (H) 10/21/2017   INR 1.17 02/07/2014   HGBA1C 5.7 04/11/2013    CT Renal Stone Study  Result Date: 01/14/2019 CLINICAL DATA:  "Pt had right flank pains and had labs/urine done at [facility] on Monday. Was started on cipro which helped pains. Reports that labs results of urine were all over the place so wanted pt to go to ED for further eval. " EXAM: CT ABDOMEN AND PELVIS WITHOUT CONTRAST TECHNIQUE: Multidetector CT imaging of the abdomen and pelvis was performed following the standard protocol without IV contrast. COMPARISON:  11/02/2013 FINDINGS: Lower chest: Clear lung bases. Changes from previous cardiac surgery. Hepatobiliary: Nodular liver consistent with cirrhosis. No discrete liver mass. Gallbladder is contracted. Multiple gallstones. No acute cholecystitis. No bile duct dilation. Pancreas: Scattered small pancreatic calcifications consistent with chronic pancreatitis. No pancreatic mass. No inflammation. No duct dilation. Spleen: Spleen borderline enlarged measuring 13 x 6 x 12 cm. No splenic mass or focal lesion. Adrenals/Urinary Tract: No adrenal masses.  Mild bilateral renal cortical thinning. Kidneys normal in position and orientation. Small, 8 mm, exophytic mass, anterior midpole of the left kidney consistent with a cyst, mildly increased in size from the prior CT. No other renal masses. Calcifications noted along the renal hila appear vascular. No convincing collecting system calcifications. No hydronephrosis. Normal ureters. Normal bladder. Stomach/Bowel: Stomach is within normal limits. Appendix appears normal. No evidence of bowel wall thickening, distention, or inflammatory changes. Vascular/Lymphatic: 3.1 cm infrarenal abdominal aortic aneurysm similar to the prior CT. Aortic atherosclerosis, also noted in the aortic arch branch vessels. No enlarged lymph nodes. Reproductive: Enlarged prostate measuring 5.4 x 4.8 x 5.0 cm. Other: No abdominal wall hernia or abnormality. No abdominopelvic ascites. Musculoskeletal: Total left hip arthroplasty, incompletely imaged, but appearing well seated and aligned. Significant degenerative changes of the lumbar spine. No fracture. No osteoblastic or osteolytic lesions. IMPRESSION: 1. No acute findings within the abdomen or pelvis. No renal or ureteral stones or hydronephrosis. No bladder mass or wall thickening. 2. Cirrhosis.  No discrete liver mass visualized. 3. Gallstones.  No acute cholecystitis. 4. 3.1 cm abdominal aortic aneurysm similar to the prior study. Aortic and branch vessel atherosclerosis. Recommend followup by ultrasound in 3 years. This recommendation follows ACR consensus guidelines: White Paper of the ACR Incidental Findings Committee II on Vascular Findings. J Am Coll Radiol 2013; 10:789-794. Aortic aneurysm NOS (ICD10-I71.9) 5. Prosthetic or per trophy. Electronically Signed   By: Lajean Manes M.D.   On: 01/14/2019 16:39    Assessment & Plan:   Zackari was seen today for recurrent skin infections.  Diagnoses and all orders for this visit:  Cellulitis of leg, left- He has worsening and  moderately severe cellulitis in the left lower extremity.  He just completed a course of Bactrim so I do not think this is staph or MRSA.  I am worried about strep.  I recommended that he be admitted to the hospital for strict lower extremity elevation, wound care, and intravenous antibiotics.  He did not agree to that recommendation.  Instead, we will try to treat this with Augmentin. -     amoxicillin-clavulanate (AUGMENTIN) 875-125 MG tablet; Take 1  tablet by mouth 2 (two) times daily for 10 days.  Wound infection -     amoxicillin-clavulanate (AUGMENTIN) 875-125 MG tablet; Take 1 tablet by mouth 2 (two) times daily for 10 days.  Ulcer of left lower extremity, limited to breakdown of skin [facility]) -     Ambulatory referral to Wound Clinic   I have discontinued Juanda Crumble E. Noguez's sulfamethoxazole-trimethoprim. I am also having him start on amoxicillin-clavulanate. Additionally, I am having him maintain his Guaifenesin, lactulose, sertraline, Naloxone HCl (NARCAN NA), fluticasone, oxycodone, ferrous sulfate, methocarbamol, lidocaine, spironolactone, apixaban, tamsulosin, nystatin ointment, finasteride, diclofenac Sodium, famotidine, predniSONE, potassium chloride SA, pravastatin, furosemide, and albuterol.  Meds ordered this encounter  Medications  . amoxicillin-clavulanate (AUGMENTIN) 875-125 MG tablet    Sig: Take 1 tablet by mouth 2 (two) times daily for 10 days.    Dispense:  20 tablet    Refill:  0     Follow-up: Return in about 5 days (around 07/26/2019).  Scarlette Calico, MD

## 2019-07-21 NOTE — Patient Instructions (Signed)

## 2019-07-25 DIAGNOSIS — M25551 Pain in right hip: Secondary | ICD-10-CM | POA: Diagnosis not present

## 2019-07-25 DIAGNOSIS — M5137 Other intervertebral disc degeneration, lumbosacral region: Secondary | ICD-10-CM | POA: Diagnosis not present

## 2019-07-25 DIAGNOSIS — M545 Low back pain: Secondary | ICD-10-CM | POA: Diagnosis not present

## 2019-07-25 DIAGNOSIS — I739 Peripheral vascular disease, unspecified: Secondary | ICD-10-CM | POA: Diagnosis not present

## 2019-07-25 DIAGNOSIS — G894 Chronic pain syndrome: Secondary | ICD-10-CM | POA: Diagnosis not present

## 2019-07-26 ENCOUNTER — Ambulatory Visit: Payer: Medicare Other | Admitting: Family

## 2019-07-26 ENCOUNTER — Other Ambulatory Visit: Payer: Self-pay | Admitting: *Deleted

## 2019-07-26 NOTE — Patient Outreach (Signed)
[facility]) Care Management  07/26/2019  [name] Dec 09, 1940 [id number]  CSW was able to make contact with patient today to follow-up regarding social work services and resources, as well as check the status of patient's ability to obtain financial assistance for wheelchair ramp installation, as well as assistance obtaining a motorized scooter.  Patient admitted to calling all of the following resources, provided to him by CSW, but has yet to receive a return call:  Arts administrator at [employer], [facility] and [employer] at [facility].  CSW explained to patient that CSW was able to make contact with a representative from [employer] and that they will be mailing him an application to see if he qualifies for financial assistance to have a wheelchair ramp installed at his home.  Patient has also been placed on their waiting list to receive services.  CSW encouraged patient to be on the lookout for the application from [employer] and to contact CSW directly if he needs assistance with completion.  Patient voiced understanding and was agreeable to this plan.  CSW agreed to try and contact Arts administrator at [employer], [facility] and [employer] at [employer] to inquire about services.  CSW also agreed to continue in her efforts to try and make contact with a representative from [place] to inquire about financial assistance through their agency to pay for the supplies to have a wheelchair ramp installed at his home.  CSW is aware that patient is on a very fixed income each month and that he will not be able to afford to have a wheelchair ramp installed at his home unless he receives financial assistance.  CSW then inquired about patient's order for a motorized scooter, agreeing to assist patient with  trying to obtain one.  Patient reported that his Primary Care Physician, Dr. [name], has submitted the order for the motorized scooter to [place] and that he is waiting for a home health physical therapist to schedule a home visit to perform an evaluation of need.  Once approved, CSW will assist patient with completion of an application for indigent funding through [place] to see if he qualifies for a percentage of the motorized scooter to be written off.  CSW will also obtain an order for the scooter from Dr. Sharlet Salina to submit to patient's insurance provider, [facility], to see what percentage they are willing to cover.  CSW will make arrangements to follow-up with patient on Tuesday, August 02, 2019, around 10:00am.  [name], BSW, MSW, [place]  Licensed Clinical Social Worker  [place]  Mailing [place] [address] [place], [place] [address] Physical Address-[address], [place], [place] [address] Toll Free Main # [phone] Fax # [phone] Cell # [phone]  Office # [phone] [email or web address]

## 2019-07-27 ENCOUNTER — Ambulatory Visit: Payer: Medicare Other | Admitting: Family

## 2019-07-27 ENCOUNTER — Other Ambulatory Visit: Payer: Self-pay

## 2019-07-27 ENCOUNTER — Encounter: Payer: Self-pay | Admitting: Internal Medicine

## 2019-07-27 ENCOUNTER — Ambulatory Visit (INDEPENDENT_AMBULATORY_CARE_PROVIDER_SITE_OTHER): Payer: Medicare Other | Admitting: Internal Medicine

## 2019-07-27 VITALS — BP 120/56 | HR 70 | Temp 98.0°F | Resp 16 | Ht 70.0 in | Wt 162.0 lb

## 2019-07-27 DIAGNOSIS — L97921 Non-pressure chronic ulcer of unspecified part of left lower leg limited to breakdown of skin: Secondary | ICD-10-CM | POA: Diagnosis not present

## 2019-07-27 DIAGNOSIS — L97911 Non-pressure chronic ulcer of unspecified part of right lower leg limited to breakdown of skin: Secondary | ICD-10-CM

## 2019-07-27 DIAGNOSIS — Z23 Encounter for immunization: Secondary | ICD-10-CM | POA: Diagnosis not present

## 2019-07-27 DIAGNOSIS — L03116 Cellulitis of left lower limb: Secondary | ICD-10-CM | POA: Diagnosis not present

## 2019-07-27 DIAGNOSIS — I739 Peripheral vascular disease, unspecified: Secondary | ICD-10-CM | POA: Insufficient documentation

## 2019-07-27 NOTE — Patient Instructions (Signed)

## 2019-07-27 NOTE — Progress Notes (Signed)
Subjective:  Patient ID: [name], male    DOB: [birth year]  Age: 79 y.o. MRN: [id number]  CC: Recurrent Skin Infections  This visit occurred during the SARS-CoV-2 public health emergency.  Safety protocols were in place, including screening questions prior to the visit, additional usage of staff PPE, and extensive cleaning of exam room while observing appropriate contact time as indicated for disinfecting solutions.    HPI [name] presents for f/up - His LE's feel better with less pain,redness, swelling but he continues to develop ulcers - there is a new one on his right heel and a new one on his anterior LLE. He has had intermittent blister and ulcers over the last 6 months.  Outpatient Medications Prior to Visit  Medication Sig Dispense Refill  . albuterol (VENTOLIN HFA) 108 (90 Base) MCG/ACT inhaler TAKE 2 PUFFS BY MOUTH EVERY 6 HOURS AS NEEDED FOR WHEEZE OR SHORTNESS OF BREATH 18 g 1  . amoxicillin-clavulanate (AUGMENTIN) 875-125 MG tablet Take 1 tablet by mouth 2 (two) times daily for 10 days. 20 tablet 0  . apixaban (ELIQUIS) 5 MG TABS tablet Take 1 tablet (5 mg total) by mouth 2 (two) times daily. 180 tablet 3  . diclofenac Sodium (VOLTAREN) 1 % GEL     . famotidine (PEPCID) 40 MG tablet Take 1 tablet (40 mg total) by mouth daily. 90 tablet 3  . finasteride (PROSCAR) 5 MG tablet TAKE 1 TABLET BY MOUTH EVERY DAY 90 tablet 1  . fluticasone (FLONASE) 50 MCG/ACT nasal spray PLACE 1 SPRAY INTO BOTH NOSTRILS EVERY MORNING. (Patient taking differently: Place 1 spray into both nostrils daily. ) 48 g 1  . furosemide (LASIX) 80 MG tablet Take 1 tablet (80 mg total) by mouth daily. 90 tablet 1  . Guaifenesin 1200 MG TB12 Take 1 tablet (1,200 mg total) by mouth 2 (two) times daily. (Patient taking differently: Take 1,200 mg by mouth 2 (two) times daily as needed (mucus). ) 180 each 3  . lactulose (CHRONULAC) 10 GM/15ML solution TAKE 30 MLS BY MOUTH 2 TIMES DAILY AS  NEEDED FOR MILD CONSTIPATION OR MODERATE CONSTIPATION (Patient taking differently: Take 20 g by mouth 2 (two) times daily as needed for mild constipation. ) 1892 mL 6  . lidocaine (XYLOCAINE) 5 % ointment Apply 1 application topically as needed. (Patient taking differently: Apply 1 application topically as needed for mild pain. ) 240 g 6  . methocarbamol (ROBAXIN) 500 MG tablet Take 1 tablet (500 mg total) by mouth every 6 (six) hours as needed for muscle spasms. 40 tablet 0  . Naloxone HCl (NARCAN NA) Place 1 application into the nose once.    . nystatin ointment (MYCOSTATIN) APPLY TO AFFECTED AREA TWICE A DAY 30 g 3  . oxyCODONE (ROXICODONE) 30 MG immediate release tablet Take 1 tablet (30 mg total) by mouth every 6 (six) hours as needed for moderate pain or severe pain. 28 tablet 0  . potassium chloride SA (KLOR-CON M20) 20 MEQ tablet Take 1 tablet (20 mEq total) by mouth daily. 90 tablet 0  . pravastatin (PRAVACHOL) 20 MG tablet TAKE 1 TABLET BY MOUTH EVERY DAY 90 tablet 1  . predniSONE (DELTASONE) 10 MG tablet Take 2 tablets (20 mg total) by mouth daily with breakfast. 60 tablet 0  . sertraline (ZOLOFT) 25 MG tablet TAKE 1 TABLET BY MOUTH EVERYDAY AT BEDTIME 90 tablet 3  . spironolactone (ALDACTONE) 25 MG tablet TAKE 1  TABLET BY MOUTH EVERY DAY 90 tablet 1  . tamsulosin (FLOMAX) 0.4 MG CAPS capsule TAKE 1 CAPSULE BY MOUTH EVERY DAY 90 capsule 1  . ferrous sulfate 325 (65 FE) MG tablet Take 1 tablet (325 mg total) by mouth 3 (three) times daily after meals for 14 days. (Patient not taking: Reported on 01/14/2019) 42 tablet 0   No facility-administered medications prior to visit.    ROS Review of Systems  Constitutional: Negative for appetite change, chills, diaphoresis, fatigue and fever.  HENT: Negative.   Eyes: Negative for visual disturbance.  Respiratory: Positive for shortness of breath. Negative for cough, chest tightness, wheezing and stridor.   Cardiovascular: Negative for chest  pain, palpitations and leg swelling.  Gastrointestinal: Negative for abdominal pain, blood in stool, constipation, diarrhea, nausea and vomiting.  Endocrine: Negative.   Genitourinary: Negative.  Negative for difficulty urinating, dysuria, frequency and urgency.  Musculoskeletal: Negative for arthralgias and myalgias.  Skin: Positive for color change and wound. Negative for pallor and rash.  Neurological: Negative.  Negative for dizziness and weakness.  Hematological: Negative for adenopathy. Does not bruise/bleed easily.  Psychiatric/Behavioral: Negative.     Objective:  BP (!) 120/56 (BP Location: Left Arm, Patient Position: Sitting, Cuff Size: Normal)   Pulse 70   Temp 98 F (36.7 C) (Oral)   Resp 16   Ht 5\' 10"  (1.778 m)   Wt 162 lb (73.5 kg)   SpO2 97%   BMI 23.24 kg/m   BP Readings from Last 3 Encounters:  07/27/19 (!) 120/56  07/21/19 (!) 150/72  06/28/19 126/82    Wt Readings from Last 3 Encounters:  07/27/19 162 lb (73.5 kg)  05/20/19 162 lb (73.5 kg)  10/21/18 172 lb (78 kg)    Physical Exam Vitals reviewed.  Constitutional:      General: He is not in acute distress.    Appearance: He is ill-appearing (in a wheelchair). He is not toxic-appearing or diaphoretic.  HENT:     Nose: Nose normal.     Mouth/Throat:     Mouth: Mucous membranes are moist.  Eyes:     General: No scleral icterus.    Conjunctiva/sclera: Conjunctivae normal.  Cardiovascular:     Rate and Rhythm: Normal rate and regular rhythm.     Pulses:          Dorsalis pedis pulses are 0 on the right side and 0 on the left side.       Posterior tibial pulses are 0 on the right side and 0 on the left side.     Heart sounds: Murmur present. Systolic murmur present with a grade of 1/6. No gallop.   Pulmonary:     Effort: Pulmonary effort is normal.     Breath sounds: No stridor or decreased air movement. Examination of the right-lower field reveals rhonchi. Examination of the left-lower field  reveals rhonchi. Rhonchi present. No decreased breath sounds, wheezing or rales.  Musculoskeletal:     Cervical back: Neck supple.     Right lower leg: Edema (trace) present.     Left lower leg: Edema (trace) present.  Feet:     Right foot:     Skin integrity: Erythema and warmth present. No blister or skin breakdown.     Toenail Condition: Right toenails are abnormally thick and long.     Left foot:     Skin integrity: Erythema and warmth present. No ulcer, blister or skin breakdown.     Toenail Condition: Left  toenails are abnormally thick and long.  Lymphadenopathy:     Cervical: No cervical adenopathy.  Skin:    Comments: Over both lower extremities there is a marked decrease in erythema, warmth, swelling, and tenderness.  There is however diffuse pinkish erythema.  He has a new ulcer on his right heel.  See photo.  There are also 2 ulcers on the left lower extremity that are granulated and weeping serous fluid.  See photo.  Neurological:     General: No focal deficit present.     Mental Status: He is alert.  Psychiatric:        Mood and Affect: Mood normal.        Behavior: Behavior normal.     Lab Results  Component Value Date   WBC 7.4 01/14/2019   HGB 12.9 (L) 01/14/2019   HCT 40.1 01/14/2019   PLT 251 01/14/2019   GLUCOSE 121 (H) 04/05/2019   CHOL 125 03/16/2017   TRIG 96 03/16/2017   HDL 42 03/16/2017   LDLCALC 64 03/16/2017   ALT 18 04/05/2019   AST 26 04/05/2019   NA 138 04/05/2019   K 3.9 04/05/2019   CL 98 04/05/2019   CREATININE 1.77 (H) 04/05/2019   BUN 42 (H) 04/05/2019   CO2 25 04/05/2019   TSH 3.48 12/22/2014   PSA 10.64 (H) 10/21/2017   INR 1.17 02/07/2014   HGBA1C 5.7 04/11/2013    CT Renal Stone Study  Result Date: 01/14/2019 CLINICAL DATA:  "Pt had right flank pains and had labs/urine done at [facility] on Monday. Was started on cipro which helped pains. Reports that labs results of urine were all over the place so wanted pt to go to ED for further  eval. " EXAM: CT ABDOMEN AND PELVIS WITHOUT CONTRAST TECHNIQUE: Multidetector CT imaging of the abdomen and pelvis was performed following the standard protocol without IV contrast. COMPARISON:  11/02/2013 FINDINGS: Lower chest: Clear lung bases. Changes from previous cardiac surgery. Hepatobiliary: Nodular liver consistent with cirrhosis. No discrete liver mass. Gallbladder is contracted. Multiple gallstones. No acute cholecystitis. No bile duct dilation. Pancreas: Scattered small pancreatic calcifications consistent with chronic pancreatitis. No pancreatic mass. No inflammation. No duct dilation. Spleen: Spleen borderline enlarged measuring 13 x 6 x 12 cm. No splenic mass or focal lesion. Adrenals/Urinary Tract: No adrenal masses. Mild bilateral renal cortical thinning. Kidneys normal in position and orientation. Small, 8 mm, exophytic mass, anterior midpole of the left kidney consistent with a cyst, mildly increased in size from the prior CT. No other renal masses. Calcifications noted along the renal hila appear vascular. No convincing collecting system calcifications. No hydronephrosis. Normal ureters. Normal bladder. Stomach/Bowel: Stomach is within normal limits. Appendix appears normal. No evidence of bowel wall thickening, distention, or inflammatory changes. Vascular/Lymphatic: 3.1 cm infrarenal abdominal aortic aneurysm similar to the prior CT. Aortic atherosclerosis, also noted in the aortic arch branch vessels. No enlarged lymph nodes. Reproductive: Enlarged prostate measuring 5.4 x 4.8 x 5.0 cm. Other: No abdominal wall hernia or abnormality. No abdominopelvic ascites. Musculoskeletal: Total left hip arthroplasty, incompletely imaged, but appearing well seated and aligned. Significant degenerative changes of the lumbar spine. No fracture. No osteoblastic or osteolytic lesions. IMPRESSION: 1. No acute findings within the abdomen or pelvis. No renal or ureteral stones or hydronephrosis. No bladder mass  or wall thickening. 2. Cirrhosis.  No discrete liver mass visualized. 3. Gallstones.  No acute cholecystitis. 4. 3.1 cm abdominal aortic aneurysm similar to the prior study. Aortic and branch  vessel atherosclerosis. Recommend followup by ultrasound in 3 years. This recommendation follows ACR consensus guidelines: White Paper of the ACR Incidental Findings Committee II on Vascular Findings. J Am Coll Radiol 2013; 10:789-794. Aortic aneurysm NOS (ICD10-I71.9) 5. Prosthetic or per trophy. Electronically Signed   By: Lajean Manes M.D.   On: 01/14/2019 16:39    Assessment & Plan:   [place] was seen today for recurrent skin infections.  Diagnoses and all orders for this visit:  PAD (peripheral artery disease) ([place])- I have asked him to undergo ABIs to see how severe this is. -     VAS [place] ABI WITH/WO TBI; Future  Ulcers of both lower legs, limited to breakdown of skin ([place])- Rressings were applied today.  I have asked him to see wound care as soon as possible. -     VAS [place] ABI WITH/WO TBI; Future -     Ambulatory referral to Wound Clinic  Cellulitis of leg, left- Improvement noted with the Augmentin.  Will continue.  At this time he is not willing to be admitted for this.  Need for pneumococcal vaccination -     Pneumococcal polysaccharide vaccine 23-valent greater than or equal to 2yo subcutaneous/IM   I am having [name] maintain his Guaifenesin, lactulose, sertraline, Naloxone HCl (NARCAN NA), fluticasone, oxycodone, ferrous sulfate, methocarbamol, lidocaine, spironolactone, apixaban, tamsulosin, nystatin ointment, finasteride, diclofenac Sodium, famotidine, predniSONE, potassium chloride SA, pravastatin, furosemide, albuterol, and amoxicillin-clavulanate.  No orders of the defined types were placed in this encounter.    Follow-up: Return in about 4 weeks (around 08/24/2019).  Scarlette Calico, MD

## 2019-07-28 ENCOUNTER — Other Ambulatory Visit: Payer: Self-pay

## 2019-07-28 NOTE — Telephone Encounter (Signed)
New message    1.Medication Requested:amoxicillin-clavulanate (AUGMENTIN) 875-125 MG tablet  2. Pharmacy (Name, Street, City):[id number] - [place], [address]  3. On Med List: Yes   4. Last Visit with PCP: 4.7.21  5. Next visit date with PCP: no appt is made at this time   Agent: Please be advised that RX refills may take up to 3 business days. We ask that you follow-up with your pharmacy.

## 2019-07-29 ENCOUNTER — Telehealth: Payer: Self-pay

## 2019-07-29 DIAGNOSIS — L97911 Non-pressure chronic ulcer of unspecified part of right lower leg limited to breakdown of skin: Secondary | ICD-10-CM

## 2019-07-29 NOTE — Telephone Encounter (Signed)
Pt contacted and give basic wound care instructions. Pt stated understanding.   While on the phone pt stated that he was more interested in the [facility] than going to wound center since he does not know if his son will be able to take him to the appointments.

## 2019-07-29 NOTE — Telephone Encounter (Signed)
Do you want to see pt to refill?

## 2019-07-29 NOTE — Telephone Encounter (Signed)
   Patient calling with questions about dressing on legs.  Please call

## 2019-07-29 NOTE — Telephone Encounter (Signed)
New message    The patient questions about dressing changes asking the CMA to call him back.   The patient stated he seen another MD and wants this messages to route to Dr. Ronnald Ramp CMA  Please advise

## 2019-08-01 ENCOUNTER — Telehealth: Payer: Self-pay

## 2019-08-01 NOTE — Telephone Encounter (Signed)
Pt wanted to let us know that his wounds are looking a lot a better. He is scheduled with Wound care and I have order for [facility] wound care as well.   Pt informed to follow up with PCP or [place] in 4 weeks. Pt stated that he wanted to follow up in 1 week (next week). Appt has been made.

## 2019-08-01 NOTE — Telephone Encounter (Signed)
New message    The patient is asking the CMA to call him back to discuss dressing change,. Asking the message be route to [facility]

## 2019-08-02 ENCOUNTER — Other Ambulatory Visit: Payer: Self-pay | Admitting: *Deleted

## 2019-08-02 NOTE — Patient Outreach (Signed)
[place] [facility]) Care Management  08/02/2019  AVON BRAHMBHATT 1941/04/16 [id number]   CSW made an attempt to try and contact patient today to follow-up regarding social work services and resources, as well as to check the status of patient's order for a motorized scooter; however, patient was unavailable at the time of CSW's call.  CSW left a HIPAA compliant message on voicemail for patient and is currently awaiting a return call.  CSW will make a second outreach attempt within the next 3-4 business days, if a return call is not received from patient in the meantime.    [name], BSW, MSW, LCSW  Licensed Education officer, environmental [facility] System  Mailing [place] [address] [place], [place] [address] Physical Address-[address], [place], [place] [address] Toll Free Main # [phone] Fax # [phone] Cell # [phone]  Office # [phone] [email or web address]

## 2019-08-05 MED ORDER — LIDOCAINE 5 % EX OINT: 1.0000 "application " | TOPICAL_OINTMENT | CUTANEOUS | 6 refills | Status: DC | PRN

## 2019-08-05 MED ORDER — AMOXICILLIN-POT CLAVULANATE 875-125 MG PO TABS: 1.0000 | ORAL_TABLET | Freq: Two times a day (BID) | ORAL | 0 refills | Status: DC

## 2019-08-08 ENCOUNTER — Ambulatory Visit: Payer: Medicare Other | Admitting: Internal Medicine

## 2019-08-08 ENCOUNTER — Other Ambulatory Visit: Payer: Self-pay | Admitting: *Deleted

## 2019-08-08 NOTE — Patient Outreach (Signed)
[place] [facility]) Care Management  08/08/2019  [name] Sep 26, 1940 [id number]  CSW was able to make contact with patient today to follow-up regarding social work services and resources, as well as to confirm that patient received the application for financial assistance from [employer] with [employer].  Patient confirmed receipt of the application, but admitted that he has not yet had an opportunity to complete it.  CSW offered to assist patient with completion of the application, but patient declined, indicating that he should not require assistance with completion, and that he is still in the process of gathering all necessary information to submit with the application for processing.  Patient plans to complete the application within the next few days, as well as make copies of the title to his mobile home, the deed to his property, title to his motor vehicle and proof of income, all required to submit with the completed and signed application.  Patient is very hopeful that he will be able to obtain financial assistance from [employer] to have a wheelchair ramp installed on his mobile home, for safety and mobility.    Unfortunately, none of the other agencies that we tried to inquire about have called [place] back.  CSW will continue in her efforts.  Patient reported that his Primary Care Physician, Dr. [name], has already submitted his order for the motorized scooter to [place] [facility], he is still just waiting for a home health physical therapist from [place] to schedule a home visit with him to perform an evaluation of medical necessity.  Once approved, CSW will assist patient with completion of an application for indigent funding through [place] to see if he qualifies for a percentage of the motorized scooter to be written off.  CSW will also obtain an order for the motorized scooter from Dr. Sharlet Salina to submit to patient's  insurance provider, [facility], to see what percentage they are willing to cover, of the total expense.  CSW explained to patient that CSW will make arrangements to follow-up with patient again next week, on Tuesday, August 16, 2019, around 9:00am, to check the status of his application process.  Patient was agreeable to this plan.  [name], BSW, MSW, LCSW  Licensed Education officer, environmental Health System  Mailing [place] [address] [place], [place] [address] Physical Address-[address], [place],  [address] Toll Free Main # [phone] Fax # [phone] Cell # [phone]  Office # [phone] [email or web address]

## 2019-08-09 ENCOUNTER — Ambulatory Visit (INDEPENDENT_AMBULATORY_CARE_PROVIDER_SITE_OTHER): Payer: Medicare Other | Admitting: Internal Medicine

## 2019-08-09 ENCOUNTER — Other Ambulatory Visit: Payer: Self-pay

## 2019-08-09 ENCOUNTER — Ambulatory Visit (INDEPENDENT_AMBULATORY_CARE_PROVIDER_SITE_OTHER): Payer: Medicare Other

## 2019-08-09 ENCOUNTER — Encounter: Payer: Self-pay | Admitting: Internal Medicine

## 2019-08-09 VITALS — BP 142/58 | HR 64 | Temp 97.7°F | Resp 20

## 2019-08-09 DIAGNOSIS — L03116 Cellulitis of left lower limb: Secondary | ICD-10-CM

## 2019-08-09 DIAGNOSIS — S298XXA Other specified injuries of thorax, initial encounter: Secondary | ICD-10-CM | POA: Diagnosis not present

## 2019-08-09 DIAGNOSIS — I700 Atherosclerosis of aorta: Secondary | ICD-10-CM | POA: Diagnosis not present

## 2019-08-09 NOTE — Patient Instructions (Signed)

## 2019-08-09 NOTE — Progress Notes (Signed)
Subjective:  Patient ID: [name], male    DOB: [birth year]  Age: 79 y.o. MRN: [id number]  CC: Fall and Recurrent Skin Infections  This visit occurred during the SARS-CoV-2 public health emergency.  Safety protocols were in place, including screening questions prior to the visit, additional usage of staff PPE, and extensive cleaning of exam room while observing appropriate contact time as indicated for disinfecting solutions.    HPI [name] presents for chest wall pain 5 days s/p a fall, right lateral chest wall hit a chair rail. He has bruising on the right side but pain over the left chest wall. He is taking oxycodone for the pain. His LE's feel much better. The ulcers are healing.  Outpatient Medications Prior to Visit  Medication Sig Dispense Refill  . albuterol (VENTOLIN HFA) 108 (90 Base) MCG/ACT inhaler TAKE 2 PUFFS BY MOUTH EVERY 6 HOURS AS NEEDED FOR WHEEZE OR SHORTNESS OF BREATH 18 g 1  . amoxicillin-clavulanate (AUGMENTIN) 875-125 MG tablet Take 1 tablet by mouth 2 (two) times daily. 20 tablet 0  . apixaban (ELIQUIS) 5 MG TABS tablet Take 1 tablet (5 mg total) by mouth 2 (two) times daily. 180 tablet 3  . diclofenac Sodium (VOLTAREN) 1 % GEL     . famotidine (PEPCID) 40 MG tablet Take 1 tablet (40 mg total) by mouth daily. 90 tablet 3  . finasteride (PROSCAR) 5 MG tablet TAKE 1 TABLET BY MOUTH EVERY DAY 90 tablet 1  . fluticasone (FLONASE) 50 MCG/ACT nasal spray PLACE 1 SPRAY INTO BOTH NOSTRILS EVERY MORNING. (Patient taking differently: Place 1 spray into both nostrils daily. ) 48 g 1  . furosemide (LASIX) 80 MG tablet Take 1 tablet (80 mg total) by mouth daily. 90 tablet 1  . Guaifenesin 1200 MG TB12 Take 1 tablet (1,200 mg total) by mouth 2 (two) times daily. (Patient taking differently: Take 1,200 mg by mouth 2 (two) times daily as needed (mucus). ) 180 each 3  . lactulose (CHRONULAC) 10 GM/15ML solution TAKE 30 MLS BY MOUTH 2 TIMES DAILY AS NEEDED  FOR MILD CONSTIPATION OR MODERATE CONSTIPATION (Patient taking differently: Take 20 g by mouth 2 (two) times daily as needed for mild constipation. ) 1892 mL 6  . lidocaine (XYLOCAINE) 5 % ointment Apply 1 application topically as needed. 240 g 6  . methocarbamol (ROBAXIN) 500 MG tablet Take 1 tablet (500 mg total) by mouth every 6 (six) hours as needed for muscle spasms. 40 tablet 0  . Naloxone HCl (NARCAN NA) Place 1 application into the nose once.    . nystatin ointment (MYCOSTATIN) APPLY TO AFFECTED AREA TWICE A DAY 30 g 3  . oxyCODONE (ROXICODONE) 30 MG immediate release tablet Take 1 tablet (30 mg total) by mouth every 6 (six) hours as needed for moderate pain or severe pain. 28 tablet 0  . potassium chloride SA (KLOR-CON M20) 20 MEQ tablet Take 1 tablet (20 mEq total) by mouth daily. 90 tablet 0  . pravastatin (PRAVACHOL) 20 MG tablet TAKE 1 TABLET BY MOUTH EVERY DAY 90 tablet 1  . predniSONE (DELTASONE) 10 MG tablet Take 2 tablets (20 mg total) by mouth daily with breakfast. 60 tablet 0  . sertraline (ZOLOFT) 25 MG tablet TAKE 1 TABLET BY MOUTH EVERYDAY AT BEDTIME 90 tablet 3  . spironolactone (ALDACTONE) 25 MG tablet TAKE 1 TABLET BY MOUTH EVERY DAY 90 tablet 1  . tamsulosin (FLOMAX) 0.4 MG CAPS capsule  TAKE 1 CAPSULE BY MOUTH EVERY DAY 90 capsule 1  . ferrous sulfate 325 (65 FE) MG tablet Take 1 tablet (325 mg total) by mouth 3 (three) times daily after meals for 14 days. (Patient not taking: Reported on 01/14/2019) 42 tablet 0   No facility-administered medications prior to visit.    ROS Review of Systems  Constitutional: Negative.  Negative for chills, fatigue and fever.  HENT: Negative.   Eyes: Negative.   Respiratory: Negative for cough, chest tightness, shortness of breath and wheezing.   Cardiovascular: Positive for chest pain.  Gastrointestinal: Negative for abdominal pain, constipation, diarrhea, nausea and vomiting.  Endocrine: Negative.   Genitourinary: Positive for flank  pain. Negative for difficulty urinating, dysuria, hematuria and urgency.  Musculoskeletal: Positive for back pain and neck pain.  Skin: Positive for color change and wound. Negative for pallor and rash.  Neurological: Negative.  Negative for dizziness, weakness and light-headedness.  Hematological: Negative for adenopathy. Does not bruise/bleed easily.  Psychiatric/Behavioral: Negative.     Objective:  BP (!) 142/58 (BP Location: Left Arm, Patient Position: Sitting, Cuff Size: Normal)   Pulse 64   Temp 97.7 F (36.5 C) (Oral)   Resp 20   SpO2 99%   BP Readings from Last 3 Encounters:  08/09/19 (!) 142/58  07/27/19 (!) 120/56  07/21/19 (!) 150/72    Wt Readings from Last 3 Encounters:  07/27/19 162 lb (73.5 kg)  05/20/19 162 lb (73.5 kg)  10/21/18 172 lb (78 kg)    Physical Exam Constitutional:      General: He is not in acute distress.    Appearance: He is ill-appearing. He is not toxic-appearing or diaphoretic.  HENT:     Mouth/Throat:     Mouth: Mucous membranes are moist.  Eyes:     General: No scleral icterus.    Conjunctiva/sclera: Conjunctivae normal.  Cardiovascular:     Rate and Rhythm: Normal rate and regular rhythm.     Heart sounds: Murmur present. Systolic murmur present with a grade of 1/6. No diastolic murmur. No friction rub.  Pulmonary:     Effort: Pulmonary effort is normal.     Breath sounds: No wheezing, rhonchi or rales.  Abdominal:     General: Abdomen is flat. There is no distension.     Palpations: There is no hepatomegaly, splenomegaly or mass.     Tenderness: There is no abdominal tenderness. There is no guarding.    Musculoskeletal:     Cervical back: Neck supple.     Right lower leg: 1+ Pitting Edema present.     Left lower leg: 1+ Pitting Edema present.     Comments: The ulcers are nearly healed. There is very mild erythema over the LE's with pitting edema. No streaking, induration, fluctuance.  Lymphadenopathy:     Cervical: No  cervical adenopathy.  Skin:    Coloration: Skin is not jaundiced.     Findings: Erythema present. No rash.  Neurological:     General: No focal deficit present.     Mental Status: He is alert.     Lab Results  Component Value Date   WBC 7.4 01/14/2019   HGB 12.9 (L) 01/14/2019   HCT 40.1 01/14/2019   PLT 251 01/14/2019   GLUCOSE 121 (H) 04/05/2019   CHOL 125 03/16/2017   TRIG 96 03/16/2017   HDL 42 03/16/2017   LDLCALC 64 03/16/2017   ALT 18 04/05/2019   AST 26 04/05/2019   NA 138 04/05/2019  K 3.9 04/05/2019   CL 98 04/05/2019   CREATININE 1.77 (H) 04/05/2019   BUN 42 (H) 04/05/2019   CO2 25 04/05/2019   TSH 3.48 12/22/2014   PSA 10.64 (H) 10/21/2017   INR 1.17 02/07/2014   HGBA1C 5.7 04/11/2013    CT Renal Stone Study  Result Date: 01/14/2019 CLINICAL DATA:  "Pt had right flank pains and had labs/urine done at [facility] on Monday. Was started on cipro which helped pains. Reports that labs results of urine were all over the place so wanted pt to go to ED for further eval. " EXAM: CT ABDOMEN AND PELVIS WITHOUT CONTRAST TECHNIQUE: Multidetector CT imaging of the abdomen and pelvis was performed following the standard protocol without IV contrast. COMPARISON:  11/02/2013 FINDINGS: Lower chest: Clear lung bases. Changes from previous cardiac surgery. Hepatobiliary: Nodular liver consistent with cirrhosis. No discrete liver mass. Gallbladder is contracted. Multiple gallstones. No acute cholecystitis. No bile duct dilation. Pancreas: Scattered small pancreatic calcifications consistent with chronic pancreatitis. No pancreatic mass. No inflammation. No duct dilation. Spleen: Spleen borderline enlarged measuring 13 x 6 x 12 cm. No splenic mass or focal lesion. Adrenals/Urinary Tract: No adrenal masses. Mild bilateral renal cortical thinning. Kidneys normal in position and orientation. Small, 8 mm, exophytic mass, anterior midpole of the left kidney consistent with a cyst, mildly increased in  size from the prior CT. No other renal masses. Calcifications noted along the renal hila appear vascular. No convincing collecting system calcifications. No hydronephrosis. Normal ureters. Normal bladder. Stomach/Bowel: Stomach is within normal limits. Appendix appears normal. No evidence of bowel wall thickening, distention, or inflammatory changes. Vascular/Lymphatic: 3.1 cm infrarenal abdominal aortic aneurysm similar to the prior CT. Aortic atherosclerosis, also noted in the aortic arch branch vessels. No enlarged lymph nodes. Reproductive: Enlarged prostate measuring 5.4 x 4.8 x 5.0 cm. Other: No abdominal wall hernia or abnormality. No abdominopelvic ascites. Musculoskeletal: Total left hip arthroplasty, incompletely imaged, but appearing well seated and aligned. Significant degenerative changes of the lumbar spine. No fracture. No osteoblastic or osteolytic lesions. IMPRESSION: 1. No acute findings within the abdomen or pelvis. No renal or ureteral stones or hydronephrosis. No bladder mass or wall thickening. 2. Cirrhosis.  No discrete liver mass visualized. 3. Gallstones.  No acute cholecystitis. 4. 3.1 cm abdominal aortic aneurysm similar to the prior study. Aortic and branch vessel atherosclerosis. Recommend followup by ultrasound in 3 years. This recommendation follows ACR consensus guidelines: White Paper of the ACR Incidental Findings Committee II on Vascular Findings. J Am Coll Radiol 2013; 10:789-794. Aortic aneurysm NOS (ICD10-I71.9) 5. Prosthetic or per trophy. Electronically Signed   By: Lajean Manes M.D.   On: 01/14/2019 16:39   DG Chest 2 View  Result Date: 08/10/2019 CLINICAL DATA:  79 year old male with history of right-sided chest trauma 5 days ago. EXAM: CHEST - 2 VIEW COMPARISON:  Chest x-ray 06/22/2018. FINDINGS: Lung volumes are normal. No consolidative airspace disease. No pleural effusions. No pneumothorax. No pulmonary nodule or mass noted. Pulmonary vasculature and the  cardiomediastinal silhouette are within normal limits. Atherosclerosis in the thoracic aorta. Status post median sternotomy for CABG. IMPRESSION: 1.  No radiographic evidence of acute cardiopulmonary disease. 2. Aortic atherosclerosis. Electronically Signed   By: Vinnie Langton M.D.   On: 08/10/2019 09:09    Assessment & Plan:   Alijiah was seen today for fall and recurrent skin infections.  Diagnoses and all orders for this visit:  Blunt injury of chest, initial encounter- He sustained an injury 5  days ago and based on his symptoms he is recovering without complications.  His physical exam and chest x-ray are reassuring that there is no significant injury to the thorax.  He will continue taking oxycodone as needed for the pain. -     DG Chest 2 View; Future  Cellulitis of leg, left - Marked improvement noted in the infection and ulcers.  He agrees to go ahead and get the arterial flow studies completed.   I am having [name] maintain his Guaifenesin, lactulose, sertraline, Naloxone HCl (NARCAN NA), fluticasone, oxycodone, ferrous sulfate, methocarbamol, spironolactone, apixaban, tamsulosin, nystatin ointment, finasteride, diclofenac Sodium, famotidine, predniSONE, potassium chloride SA, pravastatin, furosemide, albuterol, lidocaine, and amoxicillin-clavulanate.  No orders of the defined types were placed in this encounter.    Follow-up: Return if symptoms worsen or fail to improve.  Scarlette Calico, MD

## 2019-08-10 ENCOUNTER — Other Ambulatory Visit: Payer: Self-pay | Admitting: Internal Medicine

## 2019-08-10 ENCOUNTER — Encounter: Payer: Self-pay | Admitting: Internal Medicine

## 2019-08-10 DIAGNOSIS — Z7952 Long term (current) use of systemic steroids: Secondary | ICD-10-CM | POA: Diagnosis not present

## 2019-08-10 DIAGNOSIS — Z4800 Encounter for change or removal of nonsurgical wound dressing: Secondary | ICD-10-CM | POA: Diagnosis not present

## 2019-08-10 DIAGNOSIS — M549 Dorsalgia, unspecified: Secondary | ICD-10-CM

## 2019-08-10 DIAGNOSIS — Z79891 Long term (current) use of opiate analgesic: Secondary | ICD-10-CM | POA: Diagnosis not present

## 2019-08-10 DIAGNOSIS — I714 Abdominal aortic aneurysm, without rupture: Secondary | ICD-10-CM | POA: Diagnosis not present

## 2019-08-10 DIAGNOSIS — L03115 Cellulitis of right lower limb: Secondary | ICD-10-CM | POA: Diagnosis not present

## 2019-08-10 DIAGNOSIS — I739 Peripheral vascular disease, unspecified: Secondary | ICD-10-CM | POA: Diagnosis not present

## 2019-08-10 DIAGNOSIS — J449 Chronic obstructive pulmonary disease, unspecified: Secondary | ICD-10-CM | POA: Diagnosis not present

## 2019-08-10 DIAGNOSIS — Z85038 Personal history of other malignant neoplasm of large intestine: Secondary | ICD-10-CM | POA: Diagnosis not present

## 2019-08-10 DIAGNOSIS — L03116 Cellulitis of left lower limb: Secondary | ICD-10-CM | POA: Diagnosis not present

## 2019-08-10 DIAGNOSIS — I441 Atrioventricular block, second degree: Secondary | ICD-10-CM | POA: Diagnosis not present

## 2019-08-10 DIAGNOSIS — I4892 Unspecified atrial flutter: Secondary | ICD-10-CM | POA: Diagnosis not present

## 2019-08-10 DIAGNOSIS — Z7951 Long term (current) use of inhaled steroids: Secondary | ICD-10-CM | POA: Diagnosis not present

## 2019-08-10 DIAGNOSIS — I350 Nonrheumatic aortic (valve) stenosis: Secondary | ICD-10-CM | POA: Diagnosis not present

## 2019-08-10 DIAGNOSIS — I5032 Chronic diastolic (congestive) heart failure: Secondary | ICD-10-CM | POA: Diagnosis not present

## 2019-08-10 DIAGNOSIS — G8929 Other chronic pain: Secondary | ICD-10-CM | POA: Diagnosis not present

## 2019-08-10 DIAGNOSIS — Z792 Long term (current) use of antibiotics: Secondary | ICD-10-CM | POA: Diagnosis not present

## 2019-08-10 DIAGNOSIS — M545 Low back pain: Secondary | ICD-10-CM | POA: Diagnosis not present

## 2019-08-10 DIAGNOSIS — D631 Anemia in chronic kidney disease: Secondary | ICD-10-CM | POA: Diagnosis not present

## 2019-08-10 DIAGNOSIS — E785 Hyperlipidemia, unspecified: Secondary | ICD-10-CM | POA: Diagnosis not present

## 2019-08-10 DIAGNOSIS — K7460 Unspecified cirrhosis of liver: Secondary | ICD-10-CM | POA: Diagnosis not present

## 2019-08-10 DIAGNOSIS — I130 Hypertensive heart and chronic kidney disease with heart failure and stage 1 through stage 4 chronic kidney disease, or unspecified chronic kidney disease: Secondary | ICD-10-CM | POA: Diagnosis not present

## 2019-08-10 DIAGNOSIS — Z7901 Long term (current) use of anticoagulants: Secondary | ICD-10-CM | POA: Diagnosis not present

## 2019-08-10 DIAGNOSIS — T84028A Dislocation of other internal joint prosthesis, initial encounter: Secondary | ICD-10-CM

## 2019-08-10 DIAGNOSIS — N183 Chronic kidney disease, stage 3 unspecified: Secondary | ICD-10-CM | POA: Diagnosis not present

## 2019-08-10 DIAGNOSIS — I2510 Atherosclerotic heart disease of native coronary artery without angina pectoris: Secondary | ICD-10-CM | POA: Diagnosis not present

## 2019-08-11 ENCOUNTER — Telehealth: Payer: Self-pay | Admitting: Internal Medicine

## 2019-08-11 NOTE — Telephone Encounter (Signed)
New message:   [name] is calling from [facility] for some orders for a BLE zinc oxide unnaboots and a ABI mobile imaging-Index arterial brachial

## 2019-08-12 ENCOUNTER — Ambulatory Visit (HOSPITAL_COMMUNITY)
Admission: RE | Admit: 2019-08-12 | Discharge: 2019-08-12 | Disposition: A | Discharge status: Home / Self Care | Payer: Medicare Other | Source: Ambulatory Visit | Attending: Internal Medicine | Admitting: Internal Medicine

## 2019-08-12 ENCOUNTER — Other Ambulatory Visit: Payer: Self-pay

## 2019-08-12 DIAGNOSIS — L97911 Non-pressure chronic ulcer of unspecified part of right lower leg limited to breakdown of skin: Secondary | ICD-10-CM | POA: Diagnosis not present

## 2019-08-12 DIAGNOSIS — I739 Peripheral vascular disease, unspecified: Secondary | ICD-10-CM | POA: Diagnosis not present

## 2019-08-12 DIAGNOSIS — L97921 Non-pressure chronic ulcer of unspecified part of left lower leg limited to breakdown of skin: Secondary | ICD-10-CM

## 2019-08-12 NOTE — Telephone Encounter (Signed)
Fine

## 2019-08-12 NOTE — Telephone Encounter (Signed)
Called [facility] ok for verbals, left office number for call back  If needed

## 2019-08-13 ENCOUNTER — Other Ambulatory Visit: Payer: Self-pay | Admitting: Internal Medicine

## 2019-08-13 ENCOUNTER — Encounter: Payer: Self-pay | Admitting: Internal Medicine

## 2019-08-13 DIAGNOSIS — I739 Peripheral vascular disease, unspecified: Secondary | ICD-10-CM

## 2019-08-15 ENCOUNTER — Telehealth: Payer: Self-pay

## 2019-08-15 DIAGNOSIS — I441 Atrioventricular block, second degree: Secondary | ICD-10-CM | POA: Diagnosis not present

## 2019-08-15 DIAGNOSIS — I350 Nonrheumatic aortic (valve) stenosis: Secondary | ICD-10-CM | POA: Diagnosis not present

## 2019-08-15 DIAGNOSIS — Z7901 Long term (current) use of anticoagulants: Secondary | ICD-10-CM | POA: Diagnosis not present

## 2019-08-15 DIAGNOSIS — Z4800 Encounter for change or removal of nonsurgical wound dressing: Secondary | ICD-10-CM | POA: Diagnosis not present

## 2019-08-15 DIAGNOSIS — I2510 Atherosclerotic heart disease of native coronary artery without angina pectoris: Secondary | ICD-10-CM | POA: Diagnosis not present

## 2019-08-15 DIAGNOSIS — I130 Hypertensive heart and chronic kidney disease with heart failure and stage 1 through stage 4 chronic kidney disease, or unspecified chronic kidney disease: Secondary | ICD-10-CM | POA: Diagnosis not present

## 2019-08-15 DIAGNOSIS — I714 Abdominal aortic aneurysm, without rupture: Secondary | ICD-10-CM | POA: Diagnosis not present

## 2019-08-15 DIAGNOSIS — Z85038 Personal history of other malignant neoplasm of large intestine: Secondary | ICD-10-CM | POA: Diagnosis not present

## 2019-08-15 DIAGNOSIS — Z792 Long term (current) use of antibiotics: Secondary | ICD-10-CM | POA: Diagnosis not present

## 2019-08-15 DIAGNOSIS — I5032 Chronic diastolic (congestive) heart failure: Secondary | ICD-10-CM | POA: Diagnosis not present

## 2019-08-15 DIAGNOSIS — Z7951 Long term (current) use of inhaled steroids: Secondary | ICD-10-CM | POA: Diagnosis not present

## 2019-08-15 DIAGNOSIS — D631 Anemia in chronic kidney disease: Secondary | ICD-10-CM | POA: Diagnosis not present

## 2019-08-15 DIAGNOSIS — L03115 Cellulitis of right lower limb: Secondary | ICD-10-CM | POA: Diagnosis not present

## 2019-08-15 DIAGNOSIS — J449 Chronic obstructive pulmonary disease, unspecified: Secondary | ICD-10-CM | POA: Diagnosis not present

## 2019-08-15 DIAGNOSIS — E785 Hyperlipidemia, unspecified: Secondary | ICD-10-CM | POA: Diagnosis not present

## 2019-08-15 DIAGNOSIS — K7460 Unspecified cirrhosis of liver: Secondary | ICD-10-CM | POA: Diagnosis not present

## 2019-08-15 DIAGNOSIS — L03116 Cellulitis of left lower limb: Secondary | ICD-10-CM | POA: Diagnosis not present

## 2019-08-15 DIAGNOSIS — G8929 Other chronic pain: Secondary | ICD-10-CM | POA: Diagnosis not present

## 2019-08-15 DIAGNOSIS — I739 Peripheral vascular disease, unspecified: Secondary | ICD-10-CM | POA: Diagnosis not present

## 2019-08-15 DIAGNOSIS — Z7952 Long term (current) use of systemic steroids: Secondary | ICD-10-CM | POA: Diagnosis not present

## 2019-08-15 DIAGNOSIS — Z79891 Long term (current) use of opiate analgesic: Secondary | ICD-10-CM | POA: Diagnosis not present

## 2019-08-15 DIAGNOSIS — M545 Low back pain: Secondary | ICD-10-CM | POA: Diagnosis not present

## 2019-08-15 DIAGNOSIS — I4892 Unspecified atrial flutter: Secondary | ICD-10-CM | POA: Diagnosis not present

## 2019-08-15 DIAGNOSIS — N183 Chronic kidney disease, stage 3 unspecified: Secondary | ICD-10-CM | POA: Diagnosis not present

## 2019-08-15 NOTE — Telephone Encounter (Signed)
New message   Need verbal orders for bilateral lower extremities for ABI can be performed in the home.   Reason: perfusion

## 2019-08-16 ENCOUNTER — Other Ambulatory Visit: Payer: Self-pay | Admitting: *Deleted

## 2019-08-16 NOTE — Patient Outreach (Signed)
[facility]) Care Management  08/16/2019  [name] 1940/04/30 [id number]  CSW was able to make contact with patient today to follow-up regarding social work services and resources, as well as to check the status of patient's applications for wheelchair ramp installation and a motorized scooter.  Patient reported that he has already submitted his application, along with the order from his Primary Care Physician, Dr. [name], to [employer] and is just waiting for a physical therapist with their agency to perform an evaluation of medical necessity for the motorized scooter.  Patient further reported that his son, [name], submitted his application to Sharol Given, with [employer], last Thursday, August 11, 2019, for processing.  Mrs. Mortimer Fries explained to patient that once his name comes to the top of the waiting list, [employer] will send a Training and development officer to patient's home to take measurements to prepare for installation of the wheelchair ramp.  Patient stated, "I'm just in a holding pattern now, waiting to hear back from [name]".  CSW commended patient for all his hard work and diligence in getting his applications submitted.  Patient was unable to identify any additional social work needs at present; therefore, CSW agreed to follow-up with patient again on Monday, Aug 29, 2019, around 9:00am, to check the status of things.  Patient has been encouraged to contact CSW directly if additional social work needs arise in the meantime.  [name], BSW, MSW, LCSW  Licensed Education officer, [facility]  [place] [address] [place], [place] [address] Physical Address-[address], [place], [place] [address] Toll Free Main # [phone] Fax # [phone] Cell # [phone]  Office #  [phone] [email or web address]

## 2019-08-16 NOTE — Telephone Encounter (Signed)
Fine

## 2019-08-16 NOTE — Telephone Encounter (Signed)
Called left voicemail message with verbal ok to perform test

## 2019-08-17 ENCOUNTER — Telehealth: Payer: Self-pay

## 2019-08-17 DIAGNOSIS — I5032 Chronic diastolic (congestive) heart failure: Secondary | ICD-10-CM | POA: Diagnosis not present

## 2019-08-17 DIAGNOSIS — M545 Low back pain: Secondary | ICD-10-CM | POA: Diagnosis not present

## 2019-08-17 DIAGNOSIS — J449 Chronic obstructive pulmonary disease, unspecified: Secondary | ICD-10-CM | POA: Diagnosis not present

## 2019-08-17 DIAGNOSIS — Z4800 Encounter for change or removal of nonsurgical wound dressing: Secondary | ICD-10-CM | POA: Diagnosis not present

## 2019-08-17 DIAGNOSIS — I714 Abdominal aortic aneurysm, without rupture: Secondary | ICD-10-CM | POA: Diagnosis not present

## 2019-08-17 DIAGNOSIS — G8929 Other chronic pain: Secondary | ICD-10-CM | POA: Diagnosis not present

## 2019-08-17 DIAGNOSIS — Z7901 Long term (current) use of anticoagulants: Secondary | ICD-10-CM | POA: Diagnosis not present

## 2019-08-17 DIAGNOSIS — L03116 Cellulitis of left lower limb: Secondary | ICD-10-CM | POA: Diagnosis not present

## 2019-08-17 DIAGNOSIS — Z7951 Long term (current) use of inhaled steroids: Secondary | ICD-10-CM | POA: Diagnosis not present

## 2019-08-17 DIAGNOSIS — I739 Peripheral vascular disease, unspecified: Secondary | ICD-10-CM | POA: Diagnosis not present

## 2019-08-17 DIAGNOSIS — I4892 Unspecified atrial flutter: Secondary | ICD-10-CM | POA: Diagnosis not present

## 2019-08-17 DIAGNOSIS — I130 Hypertensive heart and chronic kidney disease with heart failure and stage 1 through stage 4 chronic kidney disease, or unspecified chronic kidney disease: Secondary | ICD-10-CM | POA: Diagnosis not present

## 2019-08-17 DIAGNOSIS — Z79891 Long term (current) use of opiate analgesic: Secondary | ICD-10-CM | POA: Diagnosis not present

## 2019-08-17 DIAGNOSIS — N183 Chronic kidney disease, stage 3 unspecified: Secondary | ICD-10-CM | POA: Diagnosis not present

## 2019-08-17 DIAGNOSIS — Z7952 Long term (current) use of systemic steroids: Secondary | ICD-10-CM | POA: Diagnosis not present

## 2019-08-17 DIAGNOSIS — I441 Atrioventricular block, second degree: Secondary | ICD-10-CM | POA: Diagnosis not present

## 2019-08-17 DIAGNOSIS — I2510 Atherosclerotic heart disease of native coronary artery without angina pectoris: Secondary | ICD-10-CM | POA: Diagnosis not present

## 2019-08-17 DIAGNOSIS — L03115 Cellulitis of right lower limb: Secondary | ICD-10-CM | POA: Diagnosis not present

## 2019-08-17 DIAGNOSIS — K7460 Unspecified cirrhosis of liver: Secondary | ICD-10-CM | POA: Diagnosis not present

## 2019-08-17 DIAGNOSIS — E785 Hyperlipidemia, unspecified: Secondary | ICD-10-CM | POA: Diagnosis not present

## 2019-08-17 DIAGNOSIS — I350 Nonrheumatic aortic (valve) stenosis: Secondary | ICD-10-CM | POA: Diagnosis not present

## 2019-08-17 DIAGNOSIS — Z85038 Personal history of other malignant neoplasm of large intestine: Secondary | ICD-10-CM | POA: Diagnosis not present

## 2019-08-17 DIAGNOSIS — D631 Anemia in chronic kidney disease: Secondary | ICD-10-CM | POA: Diagnosis not present

## 2019-08-17 DIAGNOSIS — Z792 Long term (current) use of antibiotics: Secondary | ICD-10-CM | POA: Diagnosis not present

## 2019-08-17 NOTE — Telephone Encounter (Signed)
Dr. Sharlet Salina, will you write a order for the following below.    Fax number-[phone] attention [facility]       New message   Need verbal orders for bilateral lower extremities for ABI can be performed in the home.   Reason: perfusion

## 2019-08-17 NOTE — Telephone Encounter (Signed)
Can they not take verbal orders for this? I am not sure how to write this since our order is for ABI done at an office and I am not sure there is one in epic for a mobile one.

## 2019-08-19 ENCOUNTER — Telehealth (INDEPENDENT_AMBULATORY_CARE_PROVIDER_SITE_OTHER): Payer: Medicare Other | Admitting: Cardiology

## 2019-08-19 VITALS — BP 148/67 | Ht 70.0 in | Wt 166.0 lb

## 2019-08-19 DIAGNOSIS — N183 Chronic kidney disease, stage 3 unspecified: Secondary | ICD-10-CM | POA: Diagnosis not present

## 2019-08-19 DIAGNOSIS — I482 Chronic atrial fibrillation, unspecified: Secondary | ICD-10-CM

## 2019-08-19 DIAGNOSIS — Z7901 Long term (current) use of anticoagulants: Secondary | ICD-10-CM

## 2019-08-19 DIAGNOSIS — I739 Peripheral vascular disease, unspecified: Secondary | ICD-10-CM | POA: Diagnosis not present

## 2019-08-19 DIAGNOSIS — Z951 Presence of aortocoronary bypass graft: Secondary | ICD-10-CM | POA: Diagnosis not present

## 2019-08-19 DIAGNOSIS — I10 Essential (primary) hypertension: Secondary | ICD-10-CM

## 2019-08-19 NOTE — Patient Instructions (Signed)
Medication Instructions:  ?Your physician recommends that you continue on your current medications as directed. Please refer to the Current Medication list given to you today. ? ?*If you need a refill on your cardiac medications before your next appointment, please call your pharmacy* ? ?Follow-Up: ?At [facility], you and your health needs are our priority.  As part of our continuing mission to provide you with exceptional heart care, we have created designated Provider Care Teams.  These Care Teams include your primary Cardiologist (physician) and Advanced Practice Providers (APPs -  Physician Assistants and Nurse Practitioners) who all work together to provide you with the care you need, when you need it. ? ?We recommend signing up for the patient portal called "MyChart".  Sign up information is provided on this After Visit Summary.  MyChart is used to connect with patients for Virtual Visits (Telemedicine).  Patients are able to view lab/test results, encounter notes, upcoming appointments, etc.  Non-urgent messages can be sent to your provider as well.   ?To learn more about what you can do with MyChart, go to [email or web address].   ? ?Your next appointment:   ?6 month(s) ? ?The format for your next appointment:   ?In Person ? ?Provider:   ?Brian Crenshaw, MD { ? ? ? ?

## 2019-08-19 NOTE — Progress Notes (Signed)
Virtual Visit via Video Note   This visit type was conducted due to national recommendations for restrictions regarding the COVID-19 Pandemic (e.g. social distancing) in an effort to limit this patient's exposure and mitigate transmission in our community.  Due to his co-morbid illnesses, this patient is at least at moderate risk for complications without adequate follow up.  This format is felt to be most appropriate for this patient at this time.  All issues noted in this document were discussed and addressed.  A limited physical exam was performed with this format.  Please refer to the patient's chart for his consent to telehealth for [facility].   The patient was identified using 2 identifiers.  Date:  08/19/2019   ID:  [name], DOB [birth year], MRN [id number]  Patient Location: Home Provider Location: Home  PCP:  [name], MD  Cardiologist:  Kirk Ruths, MD  Electrophysiologist:  None   Evaluation Performed:  Follow-Up Visit  Chief Complaint:  none  History of Present Illness:    [name] is a 79 y.o. male with multiple medical issues.  The patient had CABG in July 2010.  It does not appear that he is required any ischemia evaluation since.  He has a history of atrial flutter and had an atrial flutter ablation in 2016.  After this he had Wenckebach.  In 2020 he was admitted after a fall.  He had dislocated his hip.  He was noted to be in atrial fibrillation.  Apparently he was asymptomatic from this.  Echocardiogram done in March 2020 showed an ejection fraction of greater than 65% with severe LV enlargement and severe left atrial enlargement.  Other medical issues include an abdominal aortic aneurysm, this was noted to be 3.1 cm on a CT scan in September 2020.  He has moderate carotid disease with a 40 to 59% right internal carotid artery narrowing and a 1 to 39% left internal carotid artery narrowing by Doppler in 2019.  He has hypertension and  dyslipidemia and chronic renal insufficiency.  He has been on Eliquis 5 mg twice daily.  He was contacted today for routine follow-up.  He was recently seen by his PCP after he tripped at home and fell landing on his walker.  He had some extensive bruising on the right side.  Chest x-ray was negative.  He was also noted to have cellulitis in his lower extremity.  Mobile LE a Dopplers were done at his home and this revealed moderate reduction in his ABI on the right and left.  He feels like he is recovering from this accident.  He continues to deny any complaints of tachycardia and apparently is unaware of his atrial fibrillation.  His activity is pretty limited at home.  The patient does not have symptoms concerning for COVID-19 infection (fever, chills, cough, or new shortness of breath).    Past Medical History:  Diagnosis Date  . AAA (abdominal aortic aneurysm) ([place])   . Blindness of left eye   . 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 CAD (coronary artery disease)    a. s/p CABG in 10/2008 with LIMA-LAD, SVG-OM1 with Y-graft to PL branch, and SVG-RCA  . Carotid stenosis   . Cerebrovascular disease   . CHF (congestive heart failure) ([place])   . Cirrhosis ([place])    on CT a/p 07/2013, no longer drinking  . CONGENITAL UNSPEC REDUCTION DEFORMITY LOWER LIMB   . Constipation due to opioid therapy 2014  began about a year ago  . COPD (chronic obstructive pulmonary disease) ([place])   . HTN (hypertension)   . Hyperlipidemia   . 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 TOBACCO USE, QUIT    Past Surgical History:  Procedure Laterality Date  . CORONARY ARTERY BYPASS GRAFT  11/03/2008   Ricard Dillon - x4: left internal mammary artery to the distal left anterior descending, saphemous vein graft to the first circumflex marginal branch with a Y graft sequentiallly to a left posterolateral branch, saphenous vein graft to the distal right coronary artery  .  ELECTROPHYSIOLOGIC STUDY N/A 01/18/2015   Procedure: A-Flutter Ablation;  Surgeon: Deboraha Sprang, MD;  Location: [facility];  Service: Cardiovascular;  Laterality: N/A;  . GREEN LIGHT LASER TURP (TRANSURETHRAL RESECTION OF PROSTATE N/A 02/14/2014   Procedure: GREEN LIGHT LASER TURP (TRANSURETHRAL RESECTION OF PROSTATE;  Surgeon: Festus Aloe, MD;  Location: [facility];  Service: Urology;  Laterality: N/A;  . HIP ARTHROPLASTY Left 01/26/2018   Procedure: LEFT HIP POSTERIOR HEMIARTHROPLASTY;  Surgeon: [name], MD;  Location: [facility];  Service: Orthopedics;  Laterality: Left;  . HIP CLOSED REDUCTION Left 09/18/2018   Procedure: CLOSED MANIPULATION HIP;  Surgeon: Justice Britain, MD;  Location: [facility];  Service: Orthopedics;  Laterality: Left;  . HIP CLOSED REDUCTION Left 10/08/2018   Procedure: CLOSED MANIPULATION HIP;  Surgeon: Rod Can, MD;  Location: [facility];  Service: Orthopedics;  Laterality: Left;  . TOTAL HIP REVISION Left 06/24/2018   Procedure: ACETABULAR HIP REVISION POSTERIOR;  Surgeon: [name], MD;  Location: [facility];  Service: Orthopedics;  Laterality: Left;  . TOTAL HIP REVISION Left 10/21/2018   Procedure: POSTERIOR REVISION HIP WITH POSSIBLE CONSTRAINED LINER;  Surgeon: [name], MD;  Location: [facility];  Service: Orthopedics;  Laterality: Left;     Current Meds  Medication Sig  . albuterol (VENTOLIN HFA) 108 (90 Base) MCG/ACT inhaler TAKE 2 PUFFS BY MOUTH EVERY 6 HOURS AS NEEDED FOR WHEEZE OR SHORTNESS OF BREATH  . amoxicillin-clavulanate (AUGMENTIN) 875-125 MG tablet Take 1 tablet by mouth 2 (two) times daily.  [name] apixaban (ELIQUIS) 5 MG TABS tablet Take 1 tablet (5 mg total) by mouth 2 (two) times daily.  [name] b complex vitamins tablet Take 1 tablet by mouth daily.  . Cholecalciferol (VITAMIN D3 PO) Take by mouth.  . diclofenac Sodium (VOLTAREN) 1 % GEL   . famotidine (PEPCID) 40 MG tablet Take 1 tablet (40 mg total) by mouth daily.  . ferrous sulfate 325 (65  FE) MG tablet Take 325 mg by mouth daily with breakfast.  . finasteride (PROSCAR) 5 MG tablet TAKE 1 TABLET BY MOUTH EVERY DAY  . fluticasone (FLONASE) 50 MCG/ACT nasal spray PLACE 1 SPRAY INTO BOTH NOSTRILS EVERY MORNING. (Patient taking differently: Place 1 spray into both nostrils daily. )  . furosemide (LASIX) 80 MG tablet Take 1 tablet (80 mg total) by mouth daily.  . Guaifenesin 1200 MG TB12 Take 1 tablet (1,200 mg total) by mouth 2 (two) times daily. (Patient taking differently: Take 1,200 mg by mouth 2 (two) times daily as needed (mucus). )  . lactulose (CHRONULAC) 10 GM/15ML solution TAKE 30 MLS BY MOUTH 2 TIMES DAILY AS NEEDED FOR MILD CONSTIPATION OR MODERATE CONSTIPATION (Patient taking differently: Take 20 g by mouth 2 (two) times daily as needed for mild constipation. )  . lidocaine (XYLOCAINE) 5 % ointment Apply 1 application topically as needed.  . Multiple Vitamins-Minerals (ZINC PO) Take by  mouth.  . Naloxone HCl (NARCAN NA) Place 1 application into the nose once.  . nystatin ointment (MYCOSTATIN) APPLY TO AFFECTED AREA TWICE A DAY  . oxyCODONE (ROXICODONE) 30 MG immediate release tablet Take 1 tablet (30 mg total) by mouth every 6 (six) hours as needed for moderate pain or severe pain. (Patient taking differently: Take 30 mg by mouth every 4 (four) hours as needed. )  . potassium chloride SA (KLOR-CON M20) 20 MEQ tablet Take 1 tablet (20 mEq total) by mouth daily.  . pravastatin (PRAVACHOL) 20 MG tablet TAKE 1 TABLET BY MOUTH EVERY DAY  . predniSONE (DELTASONE) 10 MG tablet Take 2 tablets (20 mg total) by mouth daily with breakfast. (Patient taking differently: Take 20 mg by mouth as needed. )  . spironolactone (ALDACTONE) 25 MG tablet TAKE 1 TABLET BY MOUTH EVERY DAY  . tamsulosin (FLOMAX) 0.4 MG CAPS capsule TAKE 1 CAPSULE BY MOUTH EVERY DAY  . [DISCONTINUED] sertraline (ZOLOFT) 25 MG tablet TAKE 1 TABLET BY MOUTH EVERYDAY AT BEDTIME     Allergies:   Doxycycline, Gabapentin,  Amlodipine, Fish allergy, Hydrocodone, Other, and Tylenol [acetaminophen]   Social History   Tobacco Use  . Smoking status: Former Research scientist (life sciences)  . Smokeless tobacco: Never Used  Substance Use Topics  . Alcohol use: No    Alcohol/week: 0.0 standard drinks    Comment: History of heavy alcohol use per pt. Quit many years ago1/1/ 2005  . Drug use: No     Family Hx: The patient's family history includes Cancer in his brother; Cirrhosis in his mother; Heart attack in his father; Other in his brother and brother. There is no history of Colon cancer or Stomach cancer.  ROS:   Please see the history of present illness.    All other systems reviewed and are negative.   Prior CV studies:   The following studies were reviewed today: Echo March 2020-  IMPRESSIONS    1. The left ventricle has hyperdynamic systolic function, with an  ejection fraction of >65%. The cavity size was severely dilated. There is  mildly increased left ventricular wall thickness. Left ventricular  diastology could not be evaluated secondary to  atrial fibrillation. No evidence of left ventricular regional wall motion  abnormalities.  2. The right ventricle was not well visualized. The cavity was mildly  enlarged. There is not assessed.  3. Left atrial size was severely dilated.  4. Right atrial size was severely dilated.  5. The mitral valve is degenerative. Mild thickening of the mitral valve  leaflet. Mitral valve regurgitation is mild to moderate by color flow  Doppler.  6. The tricuspid valve is normal in structure.  7. The aortic valve is tricuspid Mild sclerosis of the aortic valve.  8. The aortic root and ascending aorta are normal in size and structure.  9. The inferior vena cava was dilated in size with <50% respiratory  variability.  10. No evidence of left ventricular regional wall motion abnormalities.  11. Possible PFO.   Labs/Other Tests and Data Reviewed:    EKG:  An ECG dated  10/09/2018 was personally reviewed today and demonstrated:  AF with VR 60- PVC  Recent Labs: 01/14/2019: Hemoglobin 12.9; Platelets 251 04/05/2019: ALT 18; BUN 42; Creatinine, Ser 1.77; Potassium 3.9; Sodium 138   Recent Lipid Panel Lab Results  Component Value Date/Time   CHOL 125 03/16/2017 12:16 PM   TRIG 96 03/16/2017 12:16 PM   HDL 42 03/16/2017 12:16 PM   CHOLHDL 3.0  03/16/2017 12:16 PM   CHOLHDL 3 12/13/2015 01:58 PM   LDLCALC 64 03/16/2017 12:16 PM    Wt Readings from Last 3 Encounters:  08/19/19 166 lb (75.3 kg)  07/27/19 162 lb (73.5 kg)  05/20/19 162 lb (73.5 kg)     Objective:    Vital Signs:  BP (!) 148/67   Ht 5\' 10"  (1.778 m)   Wt 166 lb (75.3 kg)   BMI 23.82 kg/m    VITAL SIGNS:  reviewed  ASSESSMENT & PLAN:    CAF- He appears to be rate controlled and asymptomatic.  Chronic anticoagulation-  Continue Eliquis 5 mg BID  CABG- Stable- no h/o angina  HTN- Controlled, normal LVF with severe LVE on echo March 2020  CRI- Stage 3 -last GFR was 36 Dec 2020  PVD- AAA 3.1 cm on CT sept 2020 Bilateral moderate carotid disease by doppler 2019 Moderately depressed LE ABI by mobile doppler 08/12/2019  Plan- Office visit in 6 months with Dr Stanford Breed   COVID-19 Education: The signs and symptoms of COVID-19 were discussed with the patient and how to seek care for testing (follow up with PCP or arrange E-visit).  The importance of social distancing was discussed today.  Time:   Today, I have spent 20 minutes with the patient with telehealth technology discussing the above problems.     Medication Adjustments/Labs and Tests Ordered: Current medicines are reviewed at length with the patient today.  Concerns regarding medicines are outlined above.   Tests Ordered: No orders of the defined types were placed in this encounter.   Medication Changes: No orders of the defined types were placed in this encounter.   Follow Up:  In Person in 6 months   Signed, Kerin Ransom, Hershal Coria  08/19/2019 9:56 AM    [place]

## 2019-08-22 DIAGNOSIS — M5137 Other intervertebral disc degeneration, lumbosacral region: Secondary | ICD-10-CM | POA: Diagnosis not present

## 2019-08-22 DIAGNOSIS — M545 Low back pain: Secondary | ICD-10-CM | POA: Diagnosis not present

## 2019-08-22 DIAGNOSIS — I739 Peripheral vascular disease, unspecified: Secondary | ICD-10-CM | POA: Diagnosis not present

## 2019-08-22 DIAGNOSIS — M25551 Pain in right hip: Secondary | ICD-10-CM | POA: Diagnosis not present

## 2019-08-22 DIAGNOSIS — G894 Chronic pain syndrome: Secondary | ICD-10-CM | POA: Diagnosis not present

## 2019-08-22 NOTE — Telephone Encounter (Signed)
This was complete on patient on 08/12/2019

## 2019-08-23 DIAGNOSIS — Z7952 Long term (current) use of systemic steroids: Secondary | ICD-10-CM | POA: Diagnosis not present

## 2019-08-23 DIAGNOSIS — D631 Anemia in chronic kidney disease: Secondary | ICD-10-CM | POA: Diagnosis not present

## 2019-08-23 DIAGNOSIS — L03116 Cellulitis of left lower limb: Secondary | ICD-10-CM | POA: Diagnosis not present

## 2019-08-23 DIAGNOSIS — J449 Chronic obstructive pulmonary disease, unspecified: Secondary | ICD-10-CM | POA: Diagnosis not present

## 2019-08-23 DIAGNOSIS — I2510 Atherosclerotic heart disease of native coronary artery without angina pectoris: Secondary | ICD-10-CM | POA: Diagnosis not present

## 2019-08-23 DIAGNOSIS — Z85038 Personal history of other malignant neoplasm of large intestine: Secondary | ICD-10-CM | POA: Diagnosis not present

## 2019-08-23 DIAGNOSIS — I350 Nonrheumatic aortic (valve) stenosis: Secondary | ICD-10-CM | POA: Diagnosis not present

## 2019-08-23 DIAGNOSIS — I714 Abdominal aortic aneurysm, without rupture: Secondary | ICD-10-CM | POA: Diagnosis not present

## 2019-08-23 DIAGNOSIS — I739 Peripheral vascular disease, unspecified: Secondary | ICD-10-CM | POA: Diagnosis not present

## 2019-08-23 DIAGNOSIS — M545 Low back pain: Secondary | ICD-10-CM | POA: Diagnosis not present

## 2019-08-23 DIAGNOSIS — G8929 Other chronic pain: Secondary | ICD-10-CM | POA: Diagnosis not present

## 2019-08-23 DIAGNOSIS — N183 Chronic kidney disease, stage 3 unspecified: Secondary | ICD-10-CM | POA: Diagnosis not present

## 2019-08-23 DIAGNOSIS — E785 Hyperlipidemia, unspecified: Secondary | ICD-10-CM | POA: Diagnosis not present

## 2019-08-23 DIAGNOSIS — K7460 Unspecified cirrhosis of liver: Secondary | ICD-10-CM | POA: Diagnosis not present

## 2019-08-23 DIAGNOSIS — Z79891 Long term (current) use of opiate analgesic: Secondary | ICD-10-CM | POA: Diagnosis not present

## 2019-08-23 DIAGNOSIS — Z4800 Encounter for change or removal of nonsurgical wound dressing: Secondary | ICD-10-CM | POA: Diagnosis not present

## 2019-08-23 DIAGNOSIS — Z7901 Long term (current) use of anticoagulants: Secondary | ICD-10-CM | POA: Diagnosis not present

## 2019-08-23 DIAGNOSIS — L03115 Cellulitis of right lower limb: Secondary | ICD-10-CM | POA: Diagnosis not present

## 2019-08-23 DIAGNOSIS — I441 Atrioventricular block, second degree: Secondary | ICD-10-CM | POA: Diagnosis not present

## 2019-08-23 DIAGNOSIS — I5032 Chronic diastolic (congestive) heart failure: Secondary | ICD-10-CM | POA: Diagnosis not present

## 2019-08-23 DIAGNOSIS — Z7951 Long term (current) use of inhaled steroids: Secondary | ICD-10-CM | POA: Diagnosis not present

## 2019-08-23 DIAGNOSIS — I130 Hypertensive heart and chronic kidney disease with heart failure and stage 1 through stage 4 chronic kidney disease, or unspecified chronic kidney disease: Secondary | ICD-10-CM | POA: Diagnosis not present

## 2019-08-23 DIAGNOSIS — I4892 Unspecified atrial flutter: Secondary | ICD-10-CM | POA: Diagnosis not present

## 2019-08-23 DIAGNOSIS — Z792 Long term (current) use of antibiotics: Secondary | ICD-10-CM | POA: Diagnosis not present

## 2019-08-24 DIAGNOSIS — I130 Hypertensive heart and chronic kidney disease with heart failure and stage 1 through stage 4 chronic kidney disease, or unspecified chronic kidney disease: Secondary | ICD-10-CM | POA: Diagnosis not present

## 2019-08-24 DIAGNOSIS — D631 Anemia in chronic kidney disease: Secondary | ICD-10-CM | POA: Diagnosis not present

## 2019-08-24 DIAGNOSIS — Z4800 Encounter for change or removal of nonsurgical wound dressing: Secondary | ICD-10-CM | POA: Diagnosis not present

## 2019-08-24 DIAGNOSIS — K7460 Unspecified cirrhosis of liver: Secondary | ICD-10-CM | POA: Diagnosis not present

## 2019-08-24 DIAGNOSIS — E785 Hyperlipidemia, unspecified: Secondary | ICD-10-CM | POA: Diagnosis not present

## 2019-08-24 DIAGNOSIS — I739 Peripheral vascular disease, unspecified: Secondary | ICD-10-CM | POA: Diagnosis not present

## 2019-08-24 DIAGNOSIS — Z79891 Long term (current) use of opiate analgesic: Secondary | ICD-10-CM | POA: Diagnosis not present

## 2019-08-24 DIAGNOSIS — Z7951 Long term (current) use of inhaled steroids: Secondary | ICD-10-CM | POA: Diagnosis not present

## 2019-08-24 DIAGNOSIS — M545 Low back pain: Secondary | ICD-10-CM | POA: Diagnosis not present

## 2019-08-24 DIAGNOSIS — J449 Chronic obstructive pulmonary disease, unspecified: Secondary | ICD-10-CM | POA: Diagnosis not present

## 2019-08-24 DIAGNOSIS — I714 Abdominal aortic aneurysm, without rupture: Secondary | ICD-10-CM | POA: Diagnosis not present

## 2019-08-24 DIAGNOSIS — L03116 Cellulitis of left lower limb: Secondary | ICD-10-CM | POA: Diagnosis not present

## 2019-08-24 DIAGNOSIS — L03115 Cellulitis of right lower limb: Secondary | ICD-10-CM | POA: Diagnosis not present

## 2019-08-24 DIAGNOSIS — Z792 Long term (current) use of antibiotics: Secondary | ICD-10-CM | POA: Diagnosis not present

## 2019-08-24 DIAGNOSIS — Z7901 Long term (current) use of anticoagulants: Secondary | ICD-10-CM | POA: Diagnosis not present

## 2019-08-24 DIAGNOSIS — I4892 Unspecified atrial flutter: Secondary | ICD-10-CM | POA: Diagnosis not present

## 2019-08-24 DIAGNOSIS — Z85038 Personal history of other malignant neoplasm of large intestine: Secondary | ICD-10-CM | POA: Diagnosis not present

## 2019-08-24 DIAGNOSIS — G8929 Other chronic pain: Secondary | ICD-10-CM | POA: Diagnosis not present

## 2019-08-24 DIAGNOSIS — Z7952 Long term (current) use of systemic steroids: Secondary | ICD-10-CM | POA: Diagnosis not present

## 2019-08-24 DIAGNOSIS — I441 Atrioventricular block, second degree: Secondary | ICD-10-CM | POA: Diagnosis not present

## 2019-08-24 DIAGNOSIS — I2510 Atherosclerotic heart disease of native coronary artery without angina pectoris: Secondary | ICD-10-CM | POA: Diagnosis not present

## 2019-08-24 DIAGNOSIS — I350 Nonrheumatic aortic (valve) stenosis: Secondary | ICD-10-CM | POA: Diagnosis not present

## 2019-08-24 DIAGNOSIS — N183 Chronic kidney disease, stage 3 unspecified: Secondary | ICD-10-CM | POA: Diagnosis not present

## 2019-08-24 DIAGNOSIS — I5032 Chronic diastolic (congestive) heart failure: Secondary | ICD-10-CM | POA: Diagnosis not present

## 2019-08-25 DIAGNOSIS — I5032 Chronic diastolic (congestive) heart failure: Secondary | ICD-10-CM | POA: Diagnosis not present

## 2019-08-25 DIAGNOSIS — M545 Low back pain: Secondary | ICD-10-CM | POA: Diagnosis not present

## 2019-08-25 DIAGNOSIS — Z7901 Long term (current) use of anticoagulants: Secondary | ICD-10-CM | POA: Diagnosis not present

## 2019-08-25 DIAGNOSIS — D631 Anemia in chronic kidney disease: Secondary | ICD-10-CM | POA: Diagnosis not present

## 2019-08-25 DIAGNOSIS — K7460 Unspecified cirrhosis of liver: Secondary | ICD-10-CM | POA: Diagnosis not present

## 2019-08-25 DIAGNOSIS — Z4800 Encounter for change or removal of nonsurgical wound dressing: Secondary | ICD-10-CM | POA: Diagnosis not present

## 2019-08-25 DIAGNOSIS — J449 Chronic obstructive pulmonary disease, unspecified: Secondary | ICD-10-CM | POA: Diagnosis not present

## 2019-08-25 DIAGNOSIS — I350 Nonrheumatic aortic (valve) stenosis: Secondary | ICD-10-CM | POA: Diagnosis not present

## 2019-08-25 DIAGNOSIS — Z792 Long term (current) use of antibiotics: Secondary | ICD-10-CM | POA: Diagnosis not present

## 2019-08-25 DIAGNOSIS — Z7951 Long term (current) use of inhaled steroids: Secondary | ICD-10-CM | POA: Diagnosis not present

## 2019-08-25 DIAGNOSIS — I739 Peripheral vascular disease, unspecified: Secondary | ICD-10-CM | POA: Diagnosis not present

## 2019-08-25 DIAGNOSIS — Z79891 Long term (current) use of opiate analgesic: Secondary | ICD-10-CM | POA: Diagnosis not present

## 2019-08-25 DIAGNOSIS — I714 Abdominal aortic aneurysm, without rupture: Secondary | ICD-10-CM | POA: Diagnosis not present

## 2019-08-25 DIAGNOSIS — Z85038 Personal history of other malignant neoplasm of large intestine: Secondary | ICD-10-CM | POA: Diagnosis not present

## 2019-08-25 DIAGNOSIS — I130 Hypertensive heart and chronic kidney disease with heart failure and stage 1 through stage 4 chronic kidney disease, or unspecified chronic kidney disease: Secondary | ICD-10-CM | POA: Diagnosis not present

## 2019-08-25 DIAGNOSIS — L03116 Cellulitis of left lower limb: Secondary | ICD-10-CM | POA: Diagnosis not present

## 2019-08-25 DIAGNOSIS — I441 Atrioventricular block, second degree: Secondary | ICD-10-CM | POA: Diagnosis not present

## 2019-08-25 DIAGNOSIS — L03115 Cellulitis of right lower limb: Secondary | ICD-10-CM | POA: Diagnosis not present

## 2019-08-25 DIAGNOSIS — Z7952 Long term (current) use of systemic steroids: Secondary | ICD-10-CM | POA: Diagnosis not present

## 2019-08-25 DIAGNOSIS — N183 Chronic kidney disease, stage 3 unspecified: Secondary | ICD-10-CM | POA: Diagnosis not present

## 2019-08-25 DIAGNOSIS — I4892 Unspecified atrial flutter: Secondary | ICD-10-CM | POA: Diagnosis not present

## 2019-08-25 DIAGNOSIS — I2510 Atherosclerotic heart disease of native coronary artery without angina pectoris: Secondary | ICD-10-CM | POA: Diagnosis not present

## 2019-08-25 DIAGNOSIS — G8929 Other chronic pain: Secondary | ICD-10-CM | POA: Diagnosis not present

## 2019-08-25 DIAGNOSIS — E785 Hyperlipidemia, unspecified: Secondary | ICD-10-CM | POA: Diagnosis not present

## 2019-08-27 ENCOUNTER — Encounter: Payer: Self-pay | Admitting: Internal Medicine

## 2019-08-29 ENCOUNTER — Other Ambulatory Visit: Payer: Self-pay | Admitting: *Deleted

## 2019-08-29 ENCOUNTER — Encounter: Payer: Medicare Other | Admitting: Surgery

## 2019-08-29 NOTE — Patient Outreach (Signed)
[facility]) Care Management  08/29/2019  [name] Oct 23, 1940 [id number]   CSW was able to make contact with patient today to follow-up regarding social work services and resources, as well as to check the status of patient's applications for wheelchair ramp installation and a motorized scooter.  Patient admitted that nothing has changed since the last time we spoke, which was roughly two weeks ago, that he is still waiting for a home health physical therapist with [facility] to perform an in-home evaluation of medical necessity for the motorized scooter.  In addition, patient is awaiting a return call from Sharol Given, representative with [employer], regarding eligibility to receive financial assistance to have a wheelchair ramp installation at his home, for safety and mobility purposes.  CSW agreed to follow-up with patient again in two weeks, on Tuesday, Sep 13, 2019, around 9:00am, to see if he has received a return call from Ms. Mortimer Fries, as well as confirm that patient has an in-home evaluation scheduled with a home health physical therapist through [facility].  Patient has been encouraged to contact CSW directly if any additional social work needs arise in the meantime.  [name], BSW, MSW, LCSW  Licensed Education officer, [facility]  Mailing [place] [address], [place], [place] [address] Physical Address-[address], [place], [place] [address] Toll Free Main # [phone] Fax # [phone] Cell # [phone]  Office # [phone] [email or web address]

## 2019-08-30 DIAGNOSIS — Z7952 Long term (current) use of systemic steroids: Secondary | ICD-10-CM | POA: Diagnosis not present

## 2019-08-30 DIAGNOSIS — I130 Hypertensive heart and chronic kidney disease with heart failure and stage 1 through stage 4 chronic kidney disease, or unspecified chronic kidney disease: Secondary | ICD-10-CM | POA: Diagnosis not present

## 2019-08-30 DIAGNOSIS — I350 Nonrheumatic aortic (valve) stenosis: Secondary | ICD-10-CM | POA: Diagnosis not present

## 2019-08-30 DIAGNOSIS — Z4800 Encounter for change or removal of nonsurgical wound dressing: Secondary | ICD-10-CM | POA: Diagnosis not present

## 2019-08-30 DIAGNOSIS — J449 Chronic obstructive pulmonary disease, unspecified: Secondary | ICD-10-CM | POA: Diagnosis not present

## 2019-08-30 DIAGNOSIS — L03115 Cellulitis of right lower limb: Secondary | ICD-10-CM | POA: Diagnosis not present

## 2019-08-30 DIAGNOSIS — I739 Peripheral vascular disease, unspecified: Secondary | ICD-10-CM | POA: Diagnosis not present

## 2019-08-30 DIAGNOSIS — E785 Hyperlipidemia, unspecified: Secondary | ICD-10-CM | POA: Diagnosis not present

## 2019-08-30 DIAGNOSIS — M545 Low back pain: Secondary | ICD-10-CM | POA: Diagnosis not present

## 2019-08-30 DIAGNOSIS — N183 Chronic kidney disease, stage 3 unspecified: Secondary | ICD-10-CM | POA: Diagnosis not present

## 2019-08-30 DIAGNOSIS — Z7951 Long term (current) use of inhaled steroids: Secondary | ICD-10-CM | POA: Diagnosis not present

## 2019-08-30 DIAGNOSIS — Z85038 Personal history of other malignant neoplasm of large intestine: Secondary | ICD-10-CM | POA: Diagnosis not present

## 2019-08-30 DIAGNOSIS — K7460 Unspecified cirrhosis of liver: Secondary | ICD-10-CM | POA: Diagnosis not present

## 2019-08-30 DIAGNOSIS — I5032 Chronic diastolic (congestive) heart failure: Secondary | ICD-10-CM | POA: Diagnosis not present

## 2019-08-30 DIAGNOSIS — Z792 Long term (current) use of antibiotics: Secondary | ICD-10-CM | POA: Diagnosis not present

## 2019-08-30 DIAGNOSIS — Z79891 Long term (current) use of opiate analgesic: Secondary | ICD-10-CM | POA: Diagnosis not present

## 2019-08-30 DIAGNOSIS — I441 Atrioventricular block, second degree: Secondary | ICD-10-CM | POA: Diagnosis not present

## 2019-08-30 DIAGNOSIS — I4892 Unspecified atrial flutter: Secondary | ICD-10-CM | POA: Diagnosis not present

## 2019-08-30 DIAGNOSIS — G8929 Other chronic pain: Secondary | ICD-10-CM | POA: Diagnosis not present

## 2019-08-30 DIAGNOSIS — Z7901 Long term (current) use of anticoagulants: Secondary | ICD-10-CM | POA: Diagnosis not present

## 2019-08-30 DIAGNOSIS — I714 Abdominal aortic aneurysm, without rupture: Secondary | ICD-10-CM | POA: Diagnosis not present

## 2019-08-30 DIAGNOSIS — L03116 Cellulitis of left lower limb: Secondary | ICD-10-CM | POA: Diagnosis not present

## 2019-08-30 DIAGNOSIS — I2510 Atherosclerotic heart disease of native coronary artery without angina pectoris: Secondary | ICD-10-CM | POA: Diagnosis not present

## 2019-08-30 DIAGNOSIS — D631 Anemia in chronic kidney disease: Secondary | ICD-10-CM | POA: Diagnosis not present

## 2019-08-31 DIAGNOSIS — Z79891 Long term (current) use of opiate analgesic: Secondary | ICD-10-CM | POA: Diagnosis not present

## 2019-08-31 DIAGNOSIS — I4892 Unspecified atrial flutter: Secondary | ICD-10-CM | POA: Diagnosis not present

## 2019-08-31 DIAGNOSIS — Z7951 Long term (current) use of inhaled steroids: Secondary | ICD-10-CM | POA: Diagnosis not present

## 2019-08-31 DIAGNOSIS — I5032 Chronic diastolic (congestive) heart failure: Secondary | ICD-10-CM | POA: Diagnosis not present

## 2019-08-31 DIAGNOSIS — I441 Atrioventricular block, second degree: Secondary | ICD-10-CM | POA: Diagnosis not present

## 2019-08-31 DIAGNOSIS — M545 Low back pain: Secondary | ICD-10-CM | POA: Diagnosis not present

## 2019-08-31 DIAGNOSIS — N183 Chronic kidney disease, stage 3 unspecified: Secondary | ICD-10-CM | POA: Diagnosis not present

## 2019-08-31 DIAGNOSIS — I714 Abdominal aortic aneurysm, without rupture: Secondary | ICD-10-CM | POA: Diagnosis not present

## 2019-08-31 DIAGNOSIS — K7460 Unspecified cirrhosis of liver: Secondary | ICD-10-CM | POA: Diagnosis not present

## 2019-08-31 DIAGNOSIS — Z85038 Personal history of other malignant neoplasm of large intestine: Secondary | ICD-10-CM | POA: Diagnosis not present

## 2019-08-31 DIAGNOSIS — L03115 Cellulitis of right lower limb: Secondary | ICD-10-CM | POA: Diagnosis not present

## 2019-08-31 DIAGNOSIS — I2510 Atherosclerotic heart disease of native coronary artery without angina pectoris: Secondary | ICD-10-CM | POA: Diagnosis not present

## 2019-08-31 DIAGNOSIS — Z7952 Long term (current) use of systemic steroids: Secondary | ICD-10-CM | POA: Diagnosis not present

## 2019-08-31 DIAGNOSIS — I130 Hypertensive heart and chronic kidney disease with heart failure and stage 1 through stage 4 chronic kidney disease, or unspecified chronic kidney disease: Secondary | ICD-10-CM | POA: Diagnosis not present

## 2019-08-31 DIAGNOSIS — L03116 Cellulitis of left lower limb: Secondary | ICD-10-CM | POA: Diagnosis not present

## 2019-08-31 DIAGNOSIS — I350 Nonrheumatic aortic (valve) stenosis: Secondary | ICD-10-CM | POA: Diagnosis not present

## 2019-08-31 DIAGNOSIS — Z792 Long term (current) use of antibiotics: Secondary | ICD-10-CM | POA: Diagnosis not present

## 2019-08-31 DIAGNOSIS — I739 Peripheral vascular disease, unspecified: Secondary | ICD-10-CM | POA: Diagnosis not present

## 2019-08-31 DIAGNOSIS — J449 Chronic obstructive pulmonary disease, unspecified: Secondary | ICD-10-CM | POA: Diagnosis not present

## 2019-08-31 DIAGNOSIS — E785 Hyperlipidemia, unspecified: Secondary | ICD-10-CM | POA: Diagnosis not present

## 2019-08-31 DIAGNOSIS — D631 Anemia in chronic kidney disease: Secondary | ICD-10-CM | POA: Diagnosis not present

## 2019-08-31 DIAGNOSIS — Z7901 Long term (current) use of anticoagulants: Secondary | ICD-10-CM | POA: Diagnosis not present

## 2019-08-31 DIAGNOSIS — Z4800 Encounter for change or removal of nonsurgical wound dressing: Secondary | ICD-10-CM | POA: Diagnosis not present

## 2019-08-31 DIAGNOSIS — G8929 Other chronic pain: Secondary | ICD-10-CM | POA: Diagnosis not present

## 2019-09-01 DIAGNOSIS — N183 Chronic kidney disease, stage 3 unspecified: Secondary | ICD-10-CM | POA: Diagnosis not present

## 2019-09-01 DIAGNOSIS — I739 Peripheral vascular disease, unspecified: Secondary | ICD-10-CM | POA: Diagnosis not present

## 2019-09-01 DIAGNOSIS — E785 Hyperlipidemia, unspecified: Secondary | ICD-10-CM | POA: Diagnosis not present

## 2019-09-01 DIAGNOSIS — Z7952 Long term (current) use of systemic steroids: Secondary | ICD-10-CM | POA: Diagnosis not present

## 2019-09-01 DIAGNOSIS — Z79891 Long term (current) use of opiate analgesic: Secondary | ICD-10-CM | POA: Diagnosis not present

## 2019-09-01 DIAGNOSIS — Z7951 Long term (current) use of inhaled steroids: Secondary | ICD-10-CM | POA: Diagnosis not present

## 2019-09-01 DIAGNOSIS — L03116 Cellulitis of left lower limb: Secondary | ICD-10-CM | POA: Diagnosis not present

## 2019-09-01 DIAGNOSIS — I130 Hypertensive heart and chronic kidney disease with heart failure and stage 1 through stage 4 chronic kidney disease, or unspecified chronic kidney disease: Secondary | ICD-10-CM | POA: Diagnosis not present

## 2019-09-01 DIAGNOSIS — L03115 Cellulitis of right lower limb: Secondary | ICD-10-CM | POA: Diagnosis not present

## 2019-09-01 DIAGNOSIS — I350 Nonrheumatic aortic (valve) stenosis: Secondary | ICD-10-CM | POA: Diagnosis not present

## 2019-09-01 DIAGNOSIS — Z792 Long term (current) use of antibiotics: Secondary | ICD-10-CM | POA: Diagnosis not present

## 2019-09-01 DIAGNOSIS — M545 Low back pain: Secondary | ICD-10-CM | POA: Diagnosis not present

## 2019-09-01 DIAGNOSIS — I5032 Chronic diastolic (congestive) heart failure: Secondary | ICD-10-CM | POA: Diagnosis not present

## 2019-09-01 DIAGNOSIS — G8929 Other chronic pain: Secondary | ICD-10-CM | POA: Diagnosis not present

## 2019-09-01 DIAGNOSIS — D631 Anemia in chronic kidney disease: Secondary | ICD-10-CM | POA: Diagnosis not present

## 2019-09-01 DIAGNOSIS — Z4800 Encounter for change or removal of nonsurgical wound dressing: Secondary | ICD-10-CM | POA: Diagnosis not present

## 2019-09-01 DIAGNOSIS — K7460 Unspecified cirrhosis of liver: Secondary | ICD-10-CM | POA: Diagnosis not present

## 2019-09-01 DIAGNOSIS — I441 Atrioventricular block, second degree: Secondary | ICD-10-CM | POA: Diagnosis not present

## 2019-09-01 DIAGNOSIS — I714 Abdominal aortic aneurysm, without rupture: Secondary | ICD-10-CM | POA: Diagnosis not present

## 2019-09-01 DIAGNOSIS — I4892 Unspecified atrial flutter: Secondary | ICD-10-CM | POA: Diagnosis not present

## 2019-09-01 DIAGNOSIS — Z85038 Personal history of other malignant neoplasm of large intestine: Secondary | ICD-10-CM | POA: Diagnosis not present

## 2019-09-01 DIAGNOSIS — Z7901 Long term (current) use of anticoagulants: Secondary | ICD-10-CM | POA: Diagnosis not present

## 2019-09-01 DIAGNOSIS — J449 Chronic obstructive pulmonary disease, unspecified: Secondary | ICD-10-CM | POA: Diagnosis not present

## 2019-09-01 DIAGNOSIS — I2510 Atherosclerotic heart disease of native coronary artery without angina pectoris: Secondary | ICD-10-CM | POA: Diagnosis not present

## 2019-09-05 ENCOUNTER — Encounter: Payer: Self-pay | Admitting: Surgery

## 2019-09-05 ENCOUNTER — Ambulatory Visit (INDEPENDENT_AMBULATORY_CARE_PROVIDER_SITE_OTHER): Payer: Medicare Other | Admitting: Surgery

## 2019-09-05 ENCOUNTER — Other Ambulatory Visit: Payer: Self-pay

## 2019-09-05 VITALS — BP 147/63 | HR 55 | Temp 97.3°F | Resp 20 | Ht 70.0 in | Wt 166.0 lb

## 2019-09-05 DIAGNOSIS — Z79891 Long term (current) use of opiate analgesic: Secondary | ICD-10-CM | POA: Diagnosis not present

## 2019-09-05 DIAGNOSIS — M79606 Pain in leg, unspecified: Secondary | ICD-10-CM | POA: Diagnosis not present

## 2019-09-05 DIAGNOSIS — I872 Venous insufficiency (chronic) (peripheral): Secondary | ICD-10-CM

## 2019-09-05 DIAGNOSIS — R519 Headache, unspecified: Secondary | ICD-10-CM | POA: Diagnosis not present

## 2019-09-05 DIAGNOSIS — I70213 Atherosclerosis of native arteries of extremities with intermittent claudication, bilateral legs: Secondary | ICD-10-CM

## 2019-09-05 DIAGNOSIS — G894 Chronic pain syndrome: Secondary | ICD-10-CM | POA: Diagnosis not present

## 2019-09-05 DIAGNOSIS — Z79899 Other long term (current) drug therapy: Secondary | ICD-10-CM | POA: Diagnosis not present

## 2019-09-05 DIAGNOSIS — M542 Cervicalgia: Secondary | ICD-10-CM | POA: Diagnosis not present

## 2019-09-05 NOTE — Telephone Encounter (Signed)
Referral has been sent to [facility] and is scheduled for 5/17

## 2019-09-05 NOTE — Progress Notes (Signed)
[facility]  Patient name: [name] MRN: [id number] DOB: 1940/10/06 Sex: male   REQUESTING PROVIDER:    Dr. Marcello Moores   REASON FOR CONSULT:    PAD  HISTORY OF PRESENT ILLNESS:   [name] is a 79 y.o. male, who is  referred for evaluation of left leg swelling and ulceration.  The patient states that for approximately 6 to 8 months he has been having issues with leg swelling.  He has also had redness in his legs.  He has been on several courses of antibiotics.  He started [employer] approximately 2 weeks ago and feels like this is making a difference.  He denies any history of DVT.  The patient has a history of CABG.  He takes Eliquis for atrial fibrillation.  He is medically managed for hypertension.  He takes a statin for hypercholesterolemia.  PAST MEDICAL HISTORY    Past Medical History:  Diagnosis Date  . AAA (abdominal aortic aneurysm) ([place])   . Blindness of left eye   . 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 CAD (coronary artery disease)    a. s/p CABG in 10/2008 with LIMA-LAD, SVG-OM1 with Y-graft to PL branch, and SVG-RCA  . Carotid stenosis   . Cerebrovascular disease   . CHF (congestive heart failure) ([place])   . Cirrhosis ([place])    on CT a/p 07/2013, no longer drinking  . CONGENITAL UNSPEC REDUCTION DEFORMITY LOWER LIMB   . Constipation due to opioid therapy 2014   began about a year ago  . COPD (chronic obstructive pulmonary disease) ([place])   . HTN (hypertension)   . Hyperlipidemia   . 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 Peripheral vascular disease ([place])   . TOBACCO USE, QUIT      FAMILY HISTORY   Family History  Problem Relation Age of Onset  . Cirrhosis Mother        died at 50  . Heart attack Father   . Other Brother        GSW  . Other Brother        died in house fire  . Cancer Brother        unsure of type  Believes colon or prostate/fim  . Colon cancer Neg Hx   . Stomach cancer Neg Hx     SOCIAL HISTORY:   Social History   Socioeconomic History  . Marital status: Widowed    Spouse name: Not on file  . Number of children: 2  . Years of education: 76  . Highest education level: High school graduate  Occupational History  . Occupation: Retired  Tobacco Use  . Smoking status: Former Research scientist (life sciences)  . Smokeless tobacco: Never Used  Substance and Sexual Activity  . Alcohol use: No    Alcohol/week: 0.0 standard drinks    Comment: History of heavy alcohol use per pt. Quit many years ago1/1/ 2005  . Drug use: No  . Sexual activity: Not Currently  Other Topics Concern  . Not on file  Social History Narrative   He lives alone here in [place].  He has 1 son, who lives in the [place],    Social Determinants of Health   Financial Resource Strain: [place]   . Difficulty of Paying Living Expenses: Not hard at all  Food Insecurity: No Food Insecurity  . Worried About Charity fundraiser in the Last Year: Never true  .  Ran Out of Food in the Last Year: Never true  Transportation Needs: No Transportation Needs  . Lack of Transportation (Medical): No  . Lack of Transportation (Non-Medical): No  Physical Activity: Inactive  . Days of Exercise per Week: 0 days  . Minutes of Exercise per Session: 0 min  Stress: No Stress Concern Present  . Feeling of Stress : Only a little  Social Connections: Slightly Isolated  . Frequency of Communication with Friends and Family: More than three times a week  . Frequency of Social Gatherings with Friends and Family: More than three times a week  . Attends Religious Services: More than 4 times per year  . Active Member of Clubs or Organizations: Yes  . Attends Archivist Meetings: More than 4 times per year  . Marital Status: Widowed  Intimate Partner Violence: Not At Risk  . Fear of Current or Ex-Partner: No  . Emotionally Abused: No  .  Physically Abused: No  . Sexually Abused: No    ALLERGIES:    Allergies  Allergen Reactions  . Doxycycline   . Gabapentin Other (See Comments)    Pt states make him go down to the floor and can not get up  . Amlodipine Nausea And Vomiting and Other (See Comments)    dizziness  . Fish Allergy Nausea And Vomiting    STATES HE HAS NOT EATEN ANY FISH INCLUDING SHELLFISH FOR PAST 40 YRS - IT CAUSED NAUSEA  . Hydrocodone Itching and Rash  . Other Nausea And Vomiting    STATES HE HAS NOT EATEN ANY FISH INCLUDING SHELLFISH FOR PAST 40 YRS - IT CAUSED NAUSEA  . Tylenol [Acetaminophen] Other (See Comments)    Liver problems    CURRENT MEDICATIONS:    Current Outpatient Medications  Medication Sig Dispense Refill  . albuterol (VENTOLIN HFA) 108 (90 Base) MCG/ACT inhaler TAKE 2 PUFFS BY MOUTH EVERY 6 HOURS AS NEEDED FOR WHEEZE OR SHORTNESS OF BREATH 18 g 1  . amoxicillin-clavulanate (AUGMENTIN) 875-125 MG tablet Take 1 tablet by mouth 2 (two) times daily. 20 tablet 0  . apixaban (ELIQUIS) 5 MG TABS tablet Take 1 tablet (5 mg total) by mouth 2 (two) times daily. 180 tablet 3  . b complex vitamins tablet Take 1 tablet by mouth daily.    . Cholecalciferol (VITAMIN D3 PO) Take by mouth.    . diclofenac Sodium (VOLTAREN) 1 % GEL     . famotidine (PEPCID) 40 MG tablet Take 1 tablet (40 mg total) by mouth daily. 90 tablet 3  . ferrous sulfate 325 (65 FE) MG tablet Take 325 mg by mouth daily with breakfast.    . finasteride (PROSCAR) 5 MG tablet TAKE 1 TABLET BY MOUTH EVERY DAY 90 tablet 1  . fluticasone (FLONASE) 50 MCG/ACT nasal spray PLACE 1 SPRAY INTO BOTH NOSTRILS EVERY MORNING. (Patient taking differently: Place 1 spray into both nostrils daily. ) 48 g 1  . furosemide (LASIX) 80 MG tablet Take 1 tablet (80 mg total) by mouth daily. 90 tablet 1  . Guaifenesin 1200 MG TB12 Take 1 tablet (1,200 mg total) by mouth 2 (two) times daily. (Patient taking differently: Take 1,200 mg by mouth 2 (two)  times daily as needed (mucus). ) 180 each 3  . lactulose (CHRONULAC) 10 GM/15ML solution TAKE 30 MLS BY MOUTH 2 TIMES DAILY AS NEEDED FOR MILD CONSTIPATION OR MODERATE CONSTIPATION (Patient taking differently: Take 20 g by mouth 2 (two) times daily as needed for mild constipation. )  1892 mL 6  . lidocaine (XYLOCAINE) 5 % ointment Apply 1 application topically as needed. 240 g 6  . Multiple Vitamins-Minerals (ZINC PO) Take by mouth.    . Naloxone HCl (NARCAN NA) Place 1 application into the nose once.    . nystatin ointment (MYCOSTATIN) APPLY TO AFFECTED AREA TWICE A DAY 30 g 3  . oxyCODONE (ROXICODONE) 30 MG immediate release tablet Take 1 tablet (30 mg total) by mouth every 6 (six) hours as needed for moderate pain or severe pain. (Patient taking differently: Take 30 mg by mouth every 4 (four) hours as needed. ) 28 tablet 0  . potassium chloride SA (KLOR-CON M20) 20 MEQ tablet Take 1 tablet (20 mEq total) by mouth daily. 90 tablet 0  . pravastatin (PRAVACHOL) 20 MG tablet TAKE 1 TABLET BY MOUTH EVERY DAY 90 tablet 1  . predniSONE (DELTASONE) 10 MG tablet Take 2 tablets (20 mg total) by mouth daily with breakfast. (Patient taking differently: Take 20 mg by mouth as needed. ) 60 tablet 0  . spironolactone (ALDACTONE) 25 MG tablet TAKE 1 TABLET BY MOUTH EVERY DAY 90 tablet 1  . tamsulosin (FLOMAX) 0.4 MG CAPS capsule TAKE 1 CAPSULE BY MOUTH EVERY DAY 90 capsule 1   No current facility-administered medications for this visit.    REVIEW OF SYSTEMS:   [X]  denotes positive finding, [ ]  denotes negative finding Cardiac  Comments:  Chest pain or chest pressure:    Shortness of breath upon exertion:    Short of breath when lying flat:    Irregular heart rhythm:        Vascular    Pain in calf, thigh, or hip brought on by ambulation:    Pain in feet at night that wakes you up from your sleep:     Blood clot in your veins:    Leg swelling:  x       Pulmonary    Oxygen at home:    Productive  cough:     Wheezing:         Neurologic    Sudden weakness in arms or legs:     Sudden numbness in arms or legs:     Sudden onset of difficulty speaking or slurred speech:    Temporary loss of vision in one eye:     Problems with dizziness:         Gastrointestinal    Blood in stool:      Vomited blood:         Genitourinary    Burning when urinating:     Blood in urine:        Psychiatric    Major depression:         Hematologic    Bleeding problems:    Problems with blood clotting too easily:        Skin    Rashes or ulcers:        Constitutional    Fever or chills:     PHYSICAL EXAM:   Vitals:   09/05/19 1110  BP: (!) 147/63  Pulse: (!) 55  Resp: 20  Temp: (!) 97.3 F (36.3 C)  SpO2: 96%  Weight: 166 lb (75.3 kg)  Height: 5\' 10"  (1.778 m)    GENERAL: The patient is a well-nourished male, in no acute distress. The vital signs are documented above. CARDIAC: There is a regular rate and rhythm.  VASCULAR: Pedal pulses are nonpalpable.  No significant edema noted today.  There  is a small area on his right heel where the skin is peeling.  There is no actual ulcer. PULMONARY: Nonlabored respirations MUSCULOSKELETAL: There are no major deformities or cyanosis. NEUROLOGIC: No focal weakness or paresthesias are detected. SKIN: There are no ulcers or rashes noted. PSYCHIATRIC: The patient has a normal affect.  STUDIES:   I have reviewed the following: +-------+-----------+-----------+------------+------------+  Right 0.74    0.29                  +-------+-----------+-----------+------------+------------+  Left  0.69    0.35                  +-------+-----------+-----------+------------+------------+   ASSESSMENT and PLAN   AAA: Currently being followed by his primary care physician maximum aortic diameter is 3.2 cm.  Leg swelling: The patient has had a good response to his [place] boots which we will replace  today.  I have him coming back in approximately 3 weeks for a venous reflux study to see if there are any other options.  He will likely be able to go into compression stockings at that time  Arterial insufficiency: Patient has moderate arterial insufficiency.  He does not have any active ulcerations at this time.  He was educated on monitoring himself for nonhealing wounds.  Should he develop a nonhealing wound angiography would be indicated.   Leia Alf, MD, FACS [facility] [phone] Pager [phone]

## 2019-09-06 ENCOUNTER — Other Ambulatory Visit: Payer: Self-pay | Admitting: *Deleted

## 2019-09-06 DIAGNOSIS — I872 Venous insufficiency (chronic) (peripheral): Secondary | ICD-10-CM

## 2019-09-06 DIAGNOSIS — I70213 Atherosclerosis of native arteries of extremities with intermittent claudication, bilateral legs: Secondary | ICD-10-CM

## 2019-09-07 DIAGNOSIS — Z79891 Long term (current) use of opiate analgesic: Secondary | ICD-10-CM | POA: Diagnosis not present

## 2019-09-07 DIAGNOSIS — Z79899 Other long term (current) drug therapy: Secondary | ICD-10-CM | POA: Diagnosis not present

## 2019-09-07 DIAGNOSIS — J449 Chronic obstructive pulmonary disease, unspecified: Secondary | ICD-10-CM | POA: Diagnosis not present

## 2019-09-07 DIAGNOSIS — I4892 Unspecified atrial flutter: Secondary | ICD-10-CM | POA: Diagnosis not present

## 2019-09-07 DIAGNOSIS — Z7901 Long term (current) use of anticoagulants: Secondary | ICD-10-CM | POA: Diagnosis not present

## 2019-09-07 DIAGNOSIS — I441 Atrioventricular block, second degree: Secondary | ICD-10-CM | POA: Diagnosis not present

## 2019-09-07 DIAGNOSIS — E785 Hyperlipidemia, unspecified: Secondary | ICD-10-CM | POA: Diagnosis not present

## 2019-09-07 DIAGNOSIS — M545 Low back pain: Secondary | ICD-10-CM | POA: Diagnosis not present

## 2019-09-07 DIAGNOSIS — K7460 Unspecified cirrhosis of liver: Secondary | ICD-10-CM | POA: Diagnosis not present

## 2019-09-07 DIAGNOSIS — I714 Abdominal aortic aneurysm, without rupture: Secondary | ICD-10-CM | POA: Diagnosis not present

## 2019-09-07 DIAGNOSIS — I739 Peripheral vascular disease, unspecified: Secondary | ICD-10-CM | POA: Diagnosis not present

## 2019-09-07 DIAGNOSIS — I2510 Atherosclerotic heart disease of native coronary artery without angina pectoris: Secondary | ICD-10-CM | POA: Diagnosis not present

## 2019-09-07 DIAGNOSIS — D631 Anemia in chronic kidney disease: Secondary | ICD-10-CM | POA: Diagnosis not present

## 2019-09-07 DIAGNOSIS — Z85038 Personal history of other malignant neoplasm of large intestine: Secondary | ICD-10-CM | POA: Diagnosis not present

## 2019-09-07 DIAGNOSIS — G8929 Other chronic pain: Secondary | ICD-10-CM | POA: Diagnosis not present

## 2019-09-07 DIAGNOSIS — Z7952 Long term (current) use of systemic steroids: Secondary | ICD-10-CM | POA: Diagnosis not present

## 2019-09-07 DIAGNOSIS — Z792 Long term (current) use of antibiotics: Secondary | ICD-10-CM | POA: Diagnosis not present

## 2019-09-07 DIAGNOSIS — I130 Hypertensive heart and chronic kidney disease with heart failure and stage 1 through stage 4 chronic kidney disease, or unspecified chronic kidney disease: Secondary | ICD-10-CM | POA: Diagnosis not present

## 2019-09-07 DIAGNOSIS — Z7951 Long term (current) use of inhaled steroids: Secondary | ICD-10-CM | POA: Diagnosis not present

## 2019-09-07 DIAGNOSIS — L03115 Cellulitis of right lower limb: Secondary | ICD-10-CM | POA: Diagnosis not present

## 2019-09-07 DIAGNOSIS — I5032 Chronic diastolic (congestive) heart failure: Secondary | ICD-10-CM | POA: Diagnosis not present

## 2019-09-07 DIAGNOSIS — Z4800 Encounter for change or removal of nonsurgical wound dressing: Secondary | ICD-10-CM | POA: Diagnosis not present

## 2019-09-07 DIAGNOSIS — I350 Nonrheumatic aortic (valve) stenosis: Secondary | ICD-10-CM | POA: Diagnosis not present

## 2019-09-07 DIAGNOSIS — N183 Chronic kidney disease, stage 3 unspecified: Secondary | ICD-10-CM | POA: Diagnosis not present

## 2019-09-07 DIAGNOSIS — L03116 Cellulitis of left lower limb: Secondary | ICD-10-CM | POA: Diagnosis not present

## 2019-09-09 ENCOUNTER — Telehealth: Payer: Self-pay | Admitting: Internal Medicine

## 2019-09-09 DIAGNOSIS — D649 Anemia, unspecified: Secondary | ICD-10-CM

## 2019-09-09 NOTE — Progress Notes (Signed)
  Chronic Care Management   Note  09/09/2019 Name: BUSTER YOHO MRN: [id number] DOB: 1940/10/13  TRYON SWIGGETT is a 79 y.o. year old male who is a primary care patient of Sharlet Salina Real Cons, MD. I reached out to [name] by phone today in response to a referral sent by Mr. Marya Amsler PCP, [name], MD.   Mr. Jimeno was given information about Chronic Care Management services today including:  1. CCM service includes personalized support from designated clinical staff supervised by his physician, including individualized plan of care and coordination with other care providers 2. 24/7 contact phone numbers for assistance for urgent and routine care needs. 3. Service will only be billed when office clinical staff spend 20 minutes or more in a month to coordinate care. 4. Only one practitioner may furnish and bill the service in a calendar month. 5. The patient may stop CCM services at any time (effective at the end of the month) by phone call to the office staff.   Patient agreed to services and verbal consent obtained.   This note is not being shared with the patient for the following reason: To respect privacy (The patient or proxy has requested that the information not be shared).  Follow up plan:   Earney Hamburg Upstream Scheduler

## 2019-09-12 DIAGNOSIS — I350 Nonrheumatic aortic (valve) stenosis: Secondary | ICD-10-CM | POA: Diagnosis not present

## 2019-09-12 DIAGNOSIS — I2510 Atherosclerotic heart disease of native coronary artery without angina pectoris: Secondary | ICD-10-CM | POA: Diagnosis not present

## 2019-09-12 DIAGNOSIS — Z792 Long term (current) use of antibiotics: Secondary | ICD-10-CM | POA: Diagnosis not present

## 2019-09-12 DIAGNOSIS — Z85038 Personal history of other malignant neoplasm of large intestine: Secondary | ICD-10-CM | POA: Diagnosis not present

## 2019-09-12 DIAGNOSIS — D631 Anemia in chronic kidney disease: Secondary | ICD-10-CM | POA: Diagnosis not present

## 2019-09-12 DIAGNOSIS — Z7952 Long term (current) use of systemic steroids: Secondary | ICD-10-CM | POA: Diagnosis not present

## 2019-09-12 DIAGNOSIS — I4892 Unspecified atrial flutter: Secondary | ICD-10-CM | POA: Diagnosis not present

## 2019-09-12 DIAGNOSIS — M545 Low back pain: Secondary | ICD-10-CM | POA: Diagnosis not present

## 2019-09-12 DIAGNOSIS — I130 Hypertensive heart and chronic kidney disease with heart failure and stage 1 through stage 4 chronic kidney disease, or unspecified chronic kidney disease: Secondary | ICD-10-CM | POA: Diagnosis not present

## 2019-09-12 DIAGNOSIS — Z79891 Long term (current) use of opiate analgesic: Secondary | ICD-10-CM | POA: Diagnosis not present

## 2019-09-12 DIAGNOSIS — I5032 Chronic diastolic (congestive) heart failure: Secondary | ICD-10-CM | POA: Diagnosis not present

## 2019-09-12 DIAGNOSIS — K7460 Unspecified cirrhosis of liver: Secondary | ICD-10-CM | POA: Diagnosis not present

## 2019-09-12 DIAGNOSIS — E785 Hyperlipidemia, unspecified: Secondary | ICD-10-CM | POA: Diagnosis not present

## 2019-09-12 DIAGNOSIS — G8929 Other chronic pain: Secondary | ICD-10-CM | POA: Diagnosis not present

## 2019-09-12 DIAGNOSIS — Z4800 Encounter for change or removal of nonsurgical wound dressing: Secondary | ICD-10-CM | POA: Diagnosis not present

## 2019-09-12 DIAGNOSIS — N183 Chronic kidney disease, stage 3 unspecified: Secondary | ICD-10-CM | POA: Diagnosis not present

## 2019-09-12 DIAGNOSIS — I739 Peripheral vascular disease, unspecified: Secondary | ICD-10-CM | POA: Diagnosis not present

## 2019-09-12 DIAGNOSIS — J449 Chronic obstructive pulmonary disease, unspecified: Secondary | ICD-10-CM | POA: Diagnosis not present

## 2019-09-12 DIAGNOSIS — I441 Atrioventricular block, second degree: Secondary | ICD-10-CM | POA: Diagnosis not present

## 2019-09-12 DIAGNOSIS — L03115 Cellulitis of right lower limb: Secondary | ICD-10-CM | POA: Diagnosis not present

## 2019-09-12 DIAGNOSIS — Z7901 Long term (current) use of anticoagulants: Secondary | ICD-10-CM | POA: Diagnosis not present

## 2019-09-12 DIAGNOSIS — Z7951 Long term (current) use of inhaled steroids: Secondary | ICD-10-CM | POA: Diagnosis not present

## 2019-09-12 DIAGNOSIS — L03116 Cellulitis of left lower limb: Secondary | ICD-10-CM | POA: Diagnosis not present

## 2019-09-12 DIAGNOSIS — I714 Abdominal aortic aneurysm, without rupture: Secondary | ICD-10-CM | POA: Diagnosis not present

## 2019-09-13 ENCOUNTER — Other Ambulatory Visit: Payer: Self-pay | Admitting: *Deleted

## 2019-09-13 NOTE — Patient Outreach (Signed)
[facility]) Care Management  09/13/2019  [name] [birth year] [id number]   CSW made an attempt to try and contact patient today to follow-up regarding social work services and resources; however, patient was unavailable at the time of CSW's call.  CSW left a HIPAA compliant message for patient on voicemail and is currently awaiting a return call.  CSW will make a second outreach attempt within the next 3-4 business days, if a return call is not received from patient in the meantime.    [name], BSW, MSW, LCSW  Licensed Education officer, environmental Health System  Mailing [place] [address], [place], [place] [address] Physical Address-[address], [place], [place] [address] Toll Free Main # [phone] Fax # [phone] Cell # [phone]  Office # [phone] [email or web address]

## 2019-09-15 DIAGNOSIS — L03115 Cellulitis of right lower limb: Secondary | ICD-10-CM | POA: Diagnosis not present

## 2019-09-15 DIAGNOSIS — Z7951 Long term (current) use of inhaled steroids: Secondary | ICD-10-CM | POA: Diagnosis not present

## 2019-09-15 DIAGNOSIS — D631 Anemia in chronic kidney disease: Secondary | ICD-10-CM | POA: Diagnosis not present

## 2019-09-15 DIAGNOSIS — I350 Nonrheumatic aortic (valve) stenosis: Secondary | ICD-10-CM | POA: Diagnosis not present

## 2019-09-15 DIAGNOSIS — Z79899 Other long term (current) drug therapy: Secondary | ICD-10-CM | POA: Diagnosis not present

## 2019-09-15 DIAGNOSIS — N183 Chronic kidney disease, stage 3 unspecified: Secondary | ICD-10-CM | POA: Diagnosis not present

## 2019-09-15 DIAGNOSIS — I5032 Chronic diastolic (congestive) heart failure: Secondary | ICD-10-CM | POA: Diagnosis not present

## 2019-09-15 DIAGNOSIS — I714 Abdominal aortic aneurysm, without rupture: Secondary | ICD-10-CM | POA: Diagnosis not present

## 2019-09-15 DIAGNOSIS — Z7901 Long term (current) use of anticoagulants: Secondary | ICD-10-CM | POA: Diagnosis not present

## 2019-09-15 DIAGNOSIS — E785 Hyperlipidemia, unspecified: Secondary | ICD-10-CM | POA: Diagnosis not present

## 2019-09-15 DIAGNOSIS — I4892 Unspecified atrial flutter: Secondary | ICD-10-CM | POA: Diagnosis not present

## 2019-09-15 DIAGNOSIS — I739 Peripheral vascular disease, unspecified: Secondary | ICD-10-CM | POA: Diagnosis not present

## 2019-09-15 DIAGNOSIS — Z7952 Long term (current) use of systemic steroids: Secondary | ICD-10-CM | POA: Diagnosis not present

## 2019-09-15 DIAGNOSIS — I2510 Atherosclerotic heart disease of native coronary artery without angina pectoris: Secondary | ICD-10-CM | POA: Diagnosis not present

## 2019-09-15 DIAGNOSIS — J449 Chronic obstructive pulmonary disease, unspecified: Secondary | ICD-10-CM | POA: Diagnosis not present

## 2019-09-15 DIAGNOSIS — K7460 Unspecified cirrhosis of liver: Secondary | ICD-10-CM | POA: Diagnosis not present

## 2019-09-15 DIAGNOSIS — Z85038 Personal history of other malignant neoplasm of large intestine: Secondary | ICD-10-CM | POA: Diagnosis not present

## 2019-09-15 DIAGNOSIS — Z4800 Encounter for change or removal of nonsurgical wound dressing: Secondary | ICD-10-CM | POA: Diagnosis not present

## 2019-09-15 DIAGNOSIS — Z792 Long term (current) use of antibiotics: Secondary | ICD-10-CM | POA: Diagnosis not present

## 2019-09-15 DIAGNOSIS — I441 Atrioventricular block, second degree: Secondary | ICD-10-CM | POA: Diagnosis not present

## 2019-09-15 DIAGNOSIS — G8929 Other chronic pain: Secondary | ICD-10-CM | POA: Diagnosis not present

## 2019-09-15 DIAGNOSIS — I130 Hypertensive heart and chronic kidney disease with heart failure and stage 1 through stage 4 chronic kidney disease, or unspecified chronic kidney disease: Secondary | ICD-10-CM | POA: Diagnosis not present

## 2019-09-15 DIAGNOSIS — L03116 Cellulitis of left lower limb: Secondary | ICD-10-CM | POA: Diagnosis not present

## 2019-09-15 DIAGNOSIS — Z79891 Long term (current) use of opiate analgesic: Secondary | ICD-10-CM | POA: Diagnosis not present

## 2019-09-15 DIAGNOSIS — M545 Low back pain: Secondary | ICD-10-CM | POA: Diagnosis not present

## 2019-09-20 ENCOUNTER — Other Ambulatory Visit: Payer: Self-pay | Admitting: *Deleted

## 2019-09-20 ENCOUNTER — Encounter: Payer: Self-pay | Admitting: *Deleted

## 2019-09-20 NOTE — Patient Outreach (Signed)
[name]) Care Management  09/20/2019  [name] 07-10-1940 [id number]  CSW was able to make contact with patient today to follow-up regarding social work services and resources, as well as to check the status of patient's applications for wheelchair ramp installation and a motorized scooter.  Patient reported that he is "officially" on the waiting list for installation of a wheelchair ramp, through [employer], having been approved to receive financial assistance through their agency, now working directly with Sharol Given.  Patient indicated that once he gets closer to the top of the waiting list, representatives with [employer] will begin coming out to his home to take measurements, purchase supplies and schedule installation.  Patient went on to say that he was recently assigned a Chronic Care Management Coordinator, through [name], arranged by his Primary Care Physician, Dr. [name], and that this individual will be assisting him with obtaining a motorized scooter, moving forward.  After thorough review of patient's EMR (Electronic Medical Record) in [place], [place] noted documentation on Sep 09, 2019, indicating that patient has elected to receive Chronic Care Management Services, which includes "personalized support from designated clinical staff supervised by his physician, including individualized plan of care and coordination with other care providers", among many other things.  CSW will perform a case closure on patient, as all goals of treatment have been met from social work standpoint and no additional social work needs have been identified at this time.  CSW will notify patient's RNCM with [facility], Tomasa Rand of CSW's plans to close patient's case.  CSW will fax an update to patient's Primary Care Physician, Dr. [name] to ensure that she is  aware of CSW's involvement with patient's plan of care.  CSW was able to confirm that patient has the correct contact information for CSW, encouraging patient to contact CSW directly if additional social work needs arise in the near future.  Patient voiced understanding and was agreeable to this plan.    [name], BSW, MSW, LCSW  Licensed Education officer, environmental Health System  Mailing [place] [address], [place], [place] [address] Physical Address-[address], [place], [place] [address] Toll Free Main # [phone] Fax # [phone] Cell # [phone]  Office # [phone] [email or web address]

## 2019-09-22 DIAGNOSIS — I441 Atrioventricular block, second degree: Secondary | ICD-10-CM | POA: Diagnosis not present

## 2019-09-22 DIAGNOSIS — L03116 Cellulitis of left lower limb: Secondary | ICD-10-CM | POA: Diagnosis not present

## 2019-09-22 DIAGNOSIS — I350 Nonrheumatic aortic (valve) stenosis: Secondary | ICD-10-CM | POA: Diagnosis not present

## 2019-09-22 DIAGNOSIS — I714 Abdominal aortic aneurysm, without rupture: Secondary | ICD-10-CM | POA: Diagnosis not present

## 2019-09-22 DIAGNOSIS — Z792 Long term (current) use of antibiotics: Secondary | ICD-10-CM | POA: Diagnosis not present

## 2019-09-22 DIAGNOSIS — Z79899 Other long term (current) drug therapy: Secondary | ICD-10-CM | POA: Diagnosis not present

## 2019-09-22 DIAGNOSIS — Z79891 Long term (current) use of opiate analgesic: Secondary | ICD-10-CM | POA: Diagnosis not present

## 2019-09-22 DIAGNOSIS — E785 Hyperlipidemia, unspecified: Secondary | ICD-10-CM | POA: Diagnosis not present

## 2019-09-22 DIAGNOSIS — Z4800 Encounter for change or removal of nonsurgical wound dressing: Secondary | ICD-10-CM | POA: Diagnosis not present

## 2019-09-22 DIAGNOSIS — I130 Hypertensive heart and chronic kidney disease with heart failure and stage 1 through stage 4 chronic kidney disease, or unspecified chronic kidney disease: Secondary | ICD-10-CM | POA: Diagnosis not present

## 2019-09-22 DIAGNOSIS — K7460 Unspecified cirrhosis of liver: Secondary | ICD-10-CM | POA: Diagnosis not present

## 2019-09-22 DIAGNOSIS — Z7901 Long term (current) use of anticoagulants: Secondary | ICD-10-CM | POA: Diagnosis not present

## 2019-09-22 DIAGNOSIS — I5032 Chronic diastolic (congestive) heart failure: Secondary | ICD-10-CM | POA: Diagnosis not present

## 2019-09-22 DIAGNOSIS — M545 Low back pain: Secondary | ICD-10-CM | POA: Diagnosis not present

## 2019-09-22 DIAGNOSIS — Z85038 Personal history of other malignant neoplasm of large intestine: Secondary | ICD-10-CM | POA: Diagnosis not present

## 2019-09-22 DIAGNOSIS — I739 Peripheral vascular disease, unspecified: Secondary | ICD-10-CM | POA: Diagnosis not present

## 2019-09-22 DIAGNOSIS — I4892 Unspecified atrial flutter: Secondary | ICD-10-CM | POA: Diagnosis not present

## 2019-09-22 DIAGNOSIS — D631 Anemia in chronic kidney disease: Secondary | ICD-10-CM | POA: Diagnosis not present

## 2019-09-22 DIAGNOSIS — J449 Chronic obstructive pulmonary disease, unspecified: Secondary | ICD-10-CM | POA: Diagnosis not present

## 2019-09-22 DIAGNOSIS — Z7952 Long term (current) use of systemic steroids: Secondary | ICD-10-CM | POA: Diagnosis not present

## 2019-09-22 DIAGNOSIS — G8929 Other chronic pain: Secondary | ICD-10-CM | POA: Diagnosis not present

## 2019-09-22 DIAGNOSIS — I2510 Atherosclerotic heart disease of native coronary artery without angina pectoris: Secondary | ICD-10-CM | POA: Diagnosis not present

## 2019-09-22 DIAGNOSIS — N183 Chronic kidney disease, stage 3 unspecified: Secondary | ICD-10-CM | POA: Diagnosis not present

## 2019-09-22 DIAGNOSIS — Z7951 Long term (current) use of inhaled steroids: Secondary | ICD-10-CM | POA: Diagnosis not present

## 2019-09-22 DIAGNOSIS — L03115 Cellulitis of right lower limb: Secondary | ICD-10-CM | POA: Diagnosis not present

## 2019-09-23 ENCOUNTER — Other Ambulatory Visit: Payer: Self-pay | Admitting: Internal Medicine

## 2019-09-26 ENCOUNTER — Other Ambulatory Visit: Payer: Self-pay | Admitting: Internal Medicine

## 2019-09-28 DIAGNOSIS — R519 Headache, unspecified: Secondary | ICD-10-CM | POA: Diagnosis not present

## 2019-09-28 DIAGNOSIS — M47812 Spondylosis without myelopathy or radiculopathy, cervical region: Secondary | ICD-10-CM | POA: Diagnosis not present

## 2019-09-29 ENCOUNTER — Other Ambulatory Visit: Payer: Self-pay

## 2019-09-29 ENCOUNTER — Ambulatory Visit (INDEPENDENT_AMBULATORY_CARE_PROVIDER_SITE_OTHER): Payer: Medicare Other | Admitting: Physician Assistant

## 2019-09-29 ENCOUNTER — Ambulatory Visit (HOSPITAL_COMMUNITY)
Admission: RE | Admit: 2019-09-29 | Discharge: 2019-09-29 | Disposition: A | Discharge status: Home / Self Care | Payer: Medicare Other | Source: Ambulatory Visit | Attending: Vascular Surgery | Admitting: Vascular Surgery

## 2019-09-29 VITALS — BP 153/54 | HR 64 | Resp 20 | Ht 70.0 in | Wt 165.0 lb

## 2019-09-29 DIAGNOSIS — I70213 Atherosclerosis of native arteries of extremities with intermittent claudication, bilateral legs: Secondary | ICD-10-CM

## 2019-09-29 DIAGNOSIS — Z79899 Other long term (current) drug therapy: Secondary | ICD-10-CM | POA: Diagnosis not present

## 2019-09-29 DIAGNOSIS — I872 Venous insufficiency (chronic) (peripheral): Secondary | ICD-10-CM

## 2019-09-29 DIAGNOSIS — Z79891 Long term (current) use of opiate analgesic: Secondary | ICD-10-CM | POA: Diagnosis not present

## 2019-09-29 NOTE — Progress Notes (Signed)
[facility] [place]     History of Present Illness  [name] is a 79 y.o. male who is  referred for evaluation of left leg swelling and ulceration.  The patient states that for approximately 6 to 8 months he has been having issues with leg swelling.  He has also had redness in his legs.  He has been on several courses of antibiotics.  He started [employer] approximately 2 weeks ago and feels like this is making a difference.  He denies any history of DVT.  He was seen by Dr. Trula Slade 09/05/2019 and he returns today for venous reflux study.  He has documented moderate arterial insuffiencey with ABI's of right 0.74 and left 0.69.  He has continued to monitor his skin for non healing wounds.  He is followed by Dr. Kirk Ruths for a known AAA and carotid stenosis.   maximum aortic diameter is 3.2 cm.   Past Medical History:  Diagnosis Date  . AAA (abdominal aortic aneurysm) ([place])   . Blindness of left eye   . 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 CAD (coronary artery disease)    a. s/p CABG in 10/2008 with LIMA-LAD, SVG-OM1 with Y-graft to PL branch, and SVG-RCA  . Carotid stenosis   . Cerebrovascular disease   . CHF (congestive heart failure) ([place])   . Cirrhosis ([place])    on CT a/p 07/2013, no longer drinking  . CONGENITAL UNSPEC REDUCTION DEFORMITY LOWER LIMB   . Constipation due to opioid therapy 2014   began about a year ago  . COPD (chronic obstructive pulmonary disease) ([place])   . HTN (hypertension)   . Hyperlipidemia   . 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 Peripheral vascular disease ([place])   . TOBACCO USE, QUIT     Past Surgical History:  Procedure Laterality Date  . CORONARY ARTERY BYPASS GRAFT  11/03/2008   Ricard Dillon - x4: left internal mammary artery to the distal left anterior descending, saphemous vein graft to the first circumflex marginal branch with a Y graft sequentiallly to a left  posterolateral branch, saphenous vein graft to the distal right coronary artery  . ELECTROPHYSIOLOGIC STUDY N/A 01/18/2015   Procedure: A-Flutter Ablation;  Surgeon: Deboraha Sprang, MD;  Location: [facility];  Service: Cardiovascular;  Laterality: N/A;  . GREEN LIGHT LASER TURP (TRANSURETHRAL RESECTION OF PROSTATE N/A 02/14/2014   Procedure: GREEN LIGHT LASER TURP (TRANSURETHRAL RESECTION OF PROSTATE;  Surgeon: Festus Aloe, MD;  Location: [facility];  Service: Urology;  Laterality: N/A;  . HIP ARTHROPLASTY Left 01/26/2018   Procedure: LEFT HIP POSTERIOR HEMIARTHROPLASTY;  Surgeon: [name], MD;  Location: [facility];  Service: Orthopedics;  Laterality: Left;  . HIP CLOSED REDUCTION Left 09/18/2018   Procedure: CLOSED MANIPULATION HIP;  Surgeon: Justice Britain, MD;  Location: [facility];  Service: Orthopedics;  Laterality: Left;  . HIP CLOSED REDUCTION Left 10/08/2018   Procedure: CLOSED MANIPULATION HIP;  Surgeon: Rod Can, MD;  Location: [facility];  Service: Orthopedics;  Laterality: Left;  . TOTAL HIP REVISION Left 06/24/2018   Procedure: ACETABULAR HIP REVISION POSTERIOR;  Surgeon: [name], MD;  Location: [facility];  Service: Orthopedics;  Laterality: Left;  . TOTAL HIP REVISION Left 10/21/2018   Procedure: POSTERIOR REVISION HIP WITH POSSIBLE CONSTRAINED LINER;  Surgeon: [name], MD;  Location: [facility];  Service: Orthopedics;  Laterality: Left;    Social History   Socioeconomic History  .  Marital status: Widowed    Spouse name: Not on file  . Number of children: 2  . Years of education: 68  . Highest education level: High school graduate  Occupational History  . Occupation: Retired  Tobacco Use  . Smoking status: Former Research scientist (life sciences)  . Smokeless tobacco: Never Used  Vaping Use  . Vaping Use: Never used  Substance and Sexual Activity  . Alcohol use: No    Alcohol/week: 0.0 standard drinks    Comment: History of heavy alcohol use per pt. Quit many years ago1/1/ 2005  . Drug  use: No  . Sexual activity: Not Currently  Other Topics Concern  . Not on file  Social History Narrative   He lives alone here in [place].  He has 1 son, who lives in the [place],    Social Determinants of Health   Financial Resource Strain: [place]   . Difficulty of Paying Living Expenses: Not hard at all  Food Insecurity: No Food Insecurity  . Worried About Charity fundraiser in the Last Year: Never true  . Ran Out of Food in the Last Year: Never true  Transportation Needs: No Transportation Needs  . Lack of Transportation (Medical): No  . Lack of Transportation (Non-Medical): No  Physical Activity: Inactive  . Days of Exercise per Week: 0 days  . Minutes of Exercise per Session: 0 min  Stress: No Stress Concern Present  . Feeling of Stress : Only a little  Social Connections: Moderately Integrated  . Frequency of Communication with Friends and Family: More than three times a week  . Frequency of Social Gatherings with Friends and Family: More than three times a week  . Attends Religious Services: More than 4 times per year  . Active Member of Clubs or Organizations: Yes  . Attends Archivist Meetings: More than 4 times per year  . Marital Status: Widowed  Intimate Partner Violence: Not At Risk  . Fear of Current or Ex-Partner: No  . Emotionally Abused: No  . Physically Abused: No  . Sexually Abused: No    Family History  Problem Relation Age of Onset  . Cirrhosis Mother        died at 15  . Heart attack Father   . Other Brother        GSW  . Other Brother        died in house fire  . Cancer Brother        unsure of type Believes colon or prostate/fim  . Colon cancer Neg Hx   . Stomach cancer Neg Hx     Current Outpatient Medications on File Prior to Visit  Medication Sig Dispense Refill  . albuterol (VENTOLIN HFA) 108 (90 Base) MCG/ACT inhaler TAKE 2 PUFFS BY MOUTH EVERY 6 HOURS AS NEEDED FOR WHEEZE OR SHORTNESS OF BREATH 18 g 1  .  amoxicillin-clavulanate (AUGMENTIN) 875-125 MG tablet Take 1 tablet by mouth 2 (two) times daily. 20 tablet 0  . apixaban (ELIQUIS) 5 MG TABS tablet Take 1 tablet (5 mg total) by mouth 2 (two) times daily. 180 tablet 3  . b complex vitamins tablet Take 1 tablet by mouth daily.    . buprenorphine (SUBUTEX) 8 MG SUBL SL tablet     . Cholecalciferol (VITAMIN D3 PO) Take by mouth.    . diclofenac Sodium (VOLTAREN) 1 % GEL     . famotidine (PEPCID) 40 MG tablet Take 1 tablet (40 mg total) by mouth daily.  90 tablet 3  . ferrous sulfate 325 (65 FE) MG tablet Take 325 mg by mouth daily with breakfast.    . finasteride (PROSCAR) 5 MG tablet TAKE 1 TABLET BY MOUTH EVERY DAY 90 tablet 1  . fluticasone (FLONASE) 50 MCG/ACT nasal spray PLACE 1 SPRAY INTO BOTH NOSTRILS EVERY MORNING. (Patient taking differently: Place 1 spray into both nostrils daily. ) 48 g 1  . furosemide (LASIX) 80 MG tablet Take 1 tablet (80 mg total) by mouth daily. 90 tablet 1  . Guaifenesin 1200 MG TB12 Take 1 tablet (1,200 mg total) by mouth 2 (two) times daily. (Patient taking differently: Take 1,200 mg by mouth 2 (two) times daily as needed (mucus). ) 180 each 3  . lactulose (CHRONULAC) 10 GM/15ML solution TAKE 30 MLS BY MOUTH 2 TIMES DAILY AS NEEDED FOR MILD CONSTIPATION OR MODERATE CONSTIPATION (Patient taking differently: Take 20 g by mouth 2 (two) times daily as needed for mild constipation. ) 1892 mL 6  . lidocaine (XYLOCAINE) 5 % ointment Apply 1 application topically as needed. 240 g 6  . Multiple Vitamins-Minerals (ZINC PO) Take by mouth.    . Naloxone HCl (NARCAN NA) Place 1 application into the nose once.    . nystatin ointment (MYCOSTATIN) APPLY TO AFFECTED AREA TWICE A DAY 30 g 3  . oxyCODONE (ROXICODONE) 30 MG immediate release tablet Take 1 tablet (30 mg total) by mouth every 6 (six) hours as needed for moderate pain or severe pain. (Patient taking differently: Take 30 mg by mouth every 4 (four) hours as needed. ) 28  tablet 0  . potassium chloride SA (KLOR-CON M20) 20 MEQ tablet Take 1 tablet (20 mEq total) by mouth daily. 90 tablet 0  . pravastatin (PRAVACHOL) 20 MG tablet TAKE 1 TABLET BY MOUTH EVERY DAY 90 tablet 1  . predniSONE (DELTASONE) 10 MG tablet Take 2 tablets (20 mg total) by mouth daily with breakfast. (Patient taking differently: Take 20 mg by mouth as needed. ) 60 tablet 0  . spironolactone (ALDACTONE) 25 MG tablet TAKE 1 TABLET BY MOUTH EVERY DAY 90 tablet 1  . tamsulosin (FLOMAX) 0.4 MG CAPS capsule TAKE 1 CAPSULE BY MOUTH EVERY DAY 90 capsule 1   No current facility-administered medications on file prior to visit.    Allergies as of 09/29/2019 - Review Complete 09/29/2019  Allergen Reaction Noted  . Doxycycline  01/26/2017  . Gabapentin Other (See Comments) 10/21/2018  . Amlodipine Nausea And Vomiting and Other (See Comments) 01/17/2015  . Fish allergy Nausea And Vomiting 03/16/2017  . Hydrocodone Itching and Rash   . Other Nausea And Vomiting 02/07/2014  . Tylenol [acetaminophen] Other (See Comments) 02/02/2014     ROS:   General:  No weight loss, Fever, chills  HEENT: No recent headaches, no nasal bleeding, no visual changes, no sore throat  Neurologic: No dizziness, blackouts, seizures. No recent symptoms of stroke or mini- stroke. No recent episodes of slurred speech, or temporary blindness.  Cardiac: No recent episodes of chest pain/pressure, no shortness of breath at rest.  No shortness of breath with exertion.  positive history of atrial fibrillation or irregular heartbeat  Vascular: No history of rest pain in feet.  No history of claudication.  positive history of non-healing ulcer, No history of DVT   Pulmonary: No home oxygen, no productive cough, no hemoptysis,  No asthma or wheezing  Musculoskeletal:  [ ]  Arthritis, [ ]  Low back pain,  [x ] Joint pain  Hematologic:No history of  hypercoagulable state.  No history of easy bleeding.  No history of  anemia  Gastrointestinal: No hematochezia or melena,  No gastroesophageal reflux, no trouble swallowing  Urinary: [ ]  chronic Kidney disease, [ ]  on HD - [ ]  MWF or [ ]  TTHS, [ ]  Burning with urination, [ ]  Frequent urination, [ ]  Difficulty urinating;   Skin: No rashes  Psychological: No history of anxiety,  No history of depression  Physical Examination  Vitals:   09/29/19 1545  BP: (!) 153/54  Pulse: 64  Resp: 20  SpO2: 96%  Weight: 165 lb (74.8 kg)  Height: 5\' 10"  (1.778 m)    Body mass index is 23.68 kg/m.  General:  Alert and oriented, no acute distress HEENT: Normal Neck: No bruit or JVD Pulmonary: Clear to auscultation bilaterally Cardiac: Regular Rate and Rhythm without murmur Abdomen: Soft, non-tender, non-distended, no mass, no scars Skin: No rash Extremity Pulses: non palpable pedal pulses, no visible wounds B LE.  Skin is with minimal erythema and right LE pitting edema.   Musculoskeletal: No deformity or edema  Neurologic: Upper and lower extremity motor grossly intact symmetric  DATA: Venous Reflux Times  +--------------+--------+------+----------+------------+-------------------  ----+  RIGHT     Reflux Reflux Reflux Diameter cmsComments                 No    Yes   Time                      +--------------+--------+------+----------+------------+-------------------  ----+  CFV      no                               +--------------+--------+------+----------+------------+-------------------  ----+  FV mid    no                               +--------------+--------+------+----------+------------+-------------------  ----+  Popliteal   no                               +--------------+--------+------+----------+------------+-------------------  ----+  GSV at SFJ  no                                +--------------+--------+------+----------+------------+-------------------  ----+  GSV prox thigh                  prior                                     ablation/stripping     +--------------+--------+------+----------+------------+-------------------  ----+  SSV Pop Fossa      yes  >500 ms   0.451                +--------------+--------+------+----------+------------+-------------------  ----+  SSV prox calf      yes  >500 ms   0.33                +--------------+--------+------+----------+------------+-------------------  ----+  SSV mid calf      yes  >500 ms   0.335                +--------------+--------+------+----------+------------+-------------------  ----+  prox thigh  no  0.245                +--------------+--------+------+----------+------------+-------------------  ----+  mid thigh        yes  >500 ms   0.266                +--------------+--------+------+----------+------------+-------------------  ----+     +--------------+---------+------+-----------+------------+---------------+  LEFT     Reflux NoRefluxReflux TimeDiameter cmsComments                   Yes                       +--------------+---------+------+-----------+------------+---------------+  CFV            yes  >1 second                 +--------------+---------+------+-----------+------------+---------------+  FV mid    no                             +--------------+---------+------+-----------+------------+---------------+  Popliteal   no                              +--------------+---------+------+-----------+------------+---------------+  GSV at SFJ                      unable to image  +--------------+---------+------+-----------+------------+---------------+  GSV prox thighno               0.373            +--------------+---------+------+-----------+------------+---------------+  GSV mid thigh no               0.421            +--------------+---------+------+-----------+------------+---------------+  GSV dist thighno               0.347            +--------------+---------+------+-----------+------------+---------------+  GSV at knee  no               0.329            +--------------+---------+------+-----------+------------+---------------+  SSV Pop Fossa no               0.541            +--------------+---------+------+-----------+------------+---------------+  SSV prox calf no               0.488            +--------------+---------+------+-----------+------------+---------------+  SSV mid calf no               0.43            +--------------+---------+------+-----------+------------+---------------+     Summary:  Right:  - No evidence of deep vein thrombosis seen in the right lower extremity,  from the common femoral through the popliteal veins.  - No evidence of superficial venous thrombosis in the right lower  extremity.  - No evidence of deep vein reflux.  - Superficial vein reflux in the SSV and mid thigh AASV. GSV harvested for  CABG.     Left:  - No evidence of deep vein thrombosis seen in the left lower extremity,  from the common femoral through the popliteal veins.  - No evidence of superficial venous thrombosis in the left lower  extremity.  - Deep vein reflux in the CFV.  - No evidence  of superficial vein reflux.  - Interstitital  fluid throughout the lower extremity.    Assessment: Right SSV reflux B LE edema with history of recurrent venous ulcers. No apparent ulcers today  Plan: He sits in a dependent position most of the day  And he is not very ambulatory.  He uses a rolling walker for short distances.    He states he can not tolerate tight fitting socks well and his skin is very friable.   I will have him measured for knee high compress 10-15 mm hg.  He was instructed to try daily exercise and ambulation with goals to increase his activity as tolerates.  He will wear the compression daily with intermittent elevation.  He was given a hand out with a picture of proper elevation example.    I will have him f/u in 4-6 weeks.  If this help control the edema and prevent further ulcers we will continue this plan of care.  He could possibly benefit from [facility] laser ablation if he tolerates progressive compressive therapy in the future.   He will need repeat ABI's in the future as well.  He does not ambulate enough to have symptoms of claudication.    Roxy Horseman PA-C [facility] [place] [place] [facility] Office: [phone]  MD in clinic [place]

## 2019-10-02 ENCOUNTER — Other Ambulatory Visit: Payer: Self-pay | Admitting: Internal Medicine

## 2019-10-06 DIAGNOSIS — Z79891 Long term (current) use of opiate analgesic: Secondary | ICD-10-CM | POA: Diagnosis not present

## 2019-10-06 DIAGNOSIS — Z79899 Other long term (current) drug therapy: Secondary | ICD-10-CM | POA: Diagnosis not present

## 2019-10-12 DIAGNOSIS — M47812 Spondylosis without myelopathy or radiculopathy, cervical region: Secondary | ICD-10-CM | POA: Diagnosis not present

## 2019-10-13 ENCOUNTER — Other Ambulatory Visit: Payer: Self-pay | Admitting: Internal Medicine

## 2019-10-13 DIAGNOSIS — Z79899 Other long term (current) drug therapy: Secondary | ICD-10-CM | POA: Diagnosis not present

## 2019-10-13 DIAGNOSIS — Z79891 Long term (current) use of opiate analgesic: Secondary | ICD-10-CM | POA: Diagnosis not present

## 2019-10-20 DIAGNOSIS — Z79891 Long term (current) use of opiate analgesic: Secondary | ICD-10-CM | POA: Diagnosis not present

## 2019-10-20 DIAGNOSIS — Z79899 Other long term (current) drug therapy: Secondary | ICD-10-CM | POA: Diagnosis not present

## 2019-10-22 ENCOUNTER — Other Ambulatory Visit: Payer: Self-pay | Admitting: Internal Medicine

## 2019-10-25 ENCOUNTER — Telehealth: Payer: Self-pay | Admitting: Internal Medicine

## 2019-10-25 MED ORDER — PREDNISONE 10 MG PO TABS: 20.0000 mg | ORAL_TABLET | Freq: Every day | ORAL | 4 refills | Status: DC

## 2019-10-25 NOTE — Telephone Encounter (Signed)
Refilled

## 2019-10-25 NOTE — Telephone Encounter (Signed)
   1.Medication Requested:predniSONE (DELTASONE) 10 MG tablet  2. Pharmacy (Name, Street, City):[id number] - [place], [place].  3. On Med List: yes  4. Last Visit with PCP: 06/28/19  5. Next visit date with PCP: 10/27/19   Agent: Please be advised that RX refills may take up to 3 business days. We ask that you follow-up with your pharmacy.

## 2019-10-25 NOTE — Addendum Note (Signed)
Addended by: [name] A on: 10/25/2019 03:06 PM   Modules accepted: Orders

## 2019-10-25 NOTE — Telephone Encounter (Signed)
Attempted to call pt. VM is full and can not received any msgs.

## 2019-10-25 NOTE — Telephone Encounter (Signed)
Has he been taking this (if so what dosing), looks like not refilled since February?

## 2019-10-25 NOTE — Telephone Encounter (Signed)
Pt stated that he was getting it refilled while going to the pain management dr. He stated that he is taking it only when his arthritis is really bad.

## 2019-10-27 ENCOUNTER — Ambulatory Visit: Payer: Medicare Other | Admitting: Internal Medicine

## 2019-10-31 ENCOUNTER — Ambulatory Visit: Payer: Medicare Other | Admitting: Internal Medicine

## 2019-11-01 ENCOUNTER — Ambulatory Visit: Payer: Medicare Other | Admitting: Internal Medicine

## 2019-11-01 ENCOUNTER — Ambulatory Visit: Payer: Medicare Other

## 2019-11-02 ENCOUNTER — Encounter: Payer: Self-pay | Admitting: Internal Medicine

## 2019-11-02 ENCOUNTER — Other Ambulatory Visit: Payer: Self-pay

## 2019-11-02 ENCOUNTER — Ambulatory Visit (INDEPENDENT_AMBULATORY_CARE_PROVIDER_SITE_OTHER): Payer: Medicare Other | Admitting: Internal Medicine

## 2019-11-02 VITALS — BP 136/86 | HR 73 | Temp 98.2°F | Ht 70.0 in

## 2019-11-02 DIAGNOSIS — M79641 Pain in right hand: Secondary | ICD-10-CM

## 2019-11-02 DIAGNOSIS — M79642 Pain in left hand: Secondary | ICD-10-CM

## 2019-11-02 DIAGNOSIS — M549 Dorsalgia, unspecified: Secondary | ICD-10-CM | POA: Diagnosis not present

## 2019-11-02 DIAGNOSIS — F1120 Opioid dependence, uncomplicated: Secondary | ICD-10-CM

## 2019-11-02 DIAGNOSIS — G8929 Other chronic pain: Secondary | ICD-10-CM

## 2019-11-02 MED ORDER — OXYCODONE HCL 15 MG PO TABS: 15.0000 mg | ORAL_TABLET | ORAL | 0 refills | Status: DC | PRN

## 2019-11-02 NOTE — Progress Notes (Signed)
Subjective:   Patient ID: [name], male    DOB: 10-07-1940, 79 y.o.   MRN: [id number]  HPI The patient is a 79 YO man coming in for several concerns including hand pain (bad flare started in the last several weeks, with redness and swelling in the hand joints, started prednisone 2 pills daily and is still taking that, he is improving, swelling is down some and almost back to normal, he is very appreciative as this helped more than most medications for him, he has a long history of joint pain and problems) and chronic back pain (more in neck and low back, previously was on oxycodone and with pain clinic, would like to try a different pain clinic as he is not satisfied with his new one, he feels that they are just trying to get his money and adjusting medications without listening to him at all, he is taking their advice and has been taking medications as prescribed, using prednisone from [place] recently for a bad flare in his hands) and opiate dependence (from back and other joint pains, previously was on oxycodone for a long time, his pain clinic had been shut down and he got into a new one, this one changed around his regimen and tried him on suboxone, after 1 month he felt terrible and in withdrawals and they agreed to give him some oxycodone up to 5 pills per day, he has been taking this as prescribed, he does not have a pain contract with them yet, feels that they are taking advantage of him to bring him in clinic all the time and trying injections that he has tried in past which have not helped).   Review of Systems  Constitutional: Positive for activity change and appetite change. Negative for fatigue, fever and unexpected weight change.  HENT: Negative.   Eyes: Negative.   Respiratory: Negative.  Negative for cough, chest tightness and shortness of breath.   Cardiovascular: Negative.  Negative for chest pain, palpitations and leg swelling.  Gastrointestinal: Negative for abdominal  distention, abdominal pain, constipation, diarrhea, nausea and vomiting.  Musculoskeletal: Positive for arthralgias, back pain, gait problem, joint swelling, myalgias, neck pain and neck stiffness.  Skin: Negative.   Neurological: Negative for syncope, weakness and numbness.  Psychiatric/Behavioral: Negative.     Objective:  Physical Exam Constitutional:      Appearance: He is well-developed.  HENT:     Head: Normocephalic and atraumatic.  Cardiovascular:     Rate and Rhythm: Normal rate and regular rhythm.  Pulmonary:     Effort: Pulmonary effort is normal. No respiratory distress.     Breath sounds: Normal breath sounds. No wheezing or rales.  Abdominal:     General: Bowel sounds are normal. There is no distension.     Palpations: Abdomen is soft.     Tenderness: There is no abdominal tenderness. There is no rebound.  Musculoskeletal:        General: Swelling, tenderness and deformity present.     Cervical back: Normal range of motion.     Comments: Swelling right MCP joint and deformity in the back and hands. Tenderness to palpation in the right hand diffusely worse in the MCP joints.   Skin:    General: Skin is warm and dry.  Neurological:     Mental Status: He is alert and oriented to person, place, and time.     Coordination: Coordination abnormal.     Comments: wheelchair     Vitals:  11/02/19 1026  BP: 136/86  Pulse: 73  Temp: 98.2 F (36.8 C)  TempSrc: Oral  SpO2: 98%  Height: 5\' 10"  (1.778 m)    This visit occurred during the SARS-CoV-2 public health emergency.  Safety protocols were in place, including screening questions prior to the visit, additional usage of staff PPE, and extensive cleaning of exam room while observing appropriate contact time as indicated for disinfecting solutions.   Assessment & Plan:

## 2019-11-02 NOTE — Patient Instructions (Signed)
We have sent in the oxycodone to take up to 5 per day.

## 2019-11-03 ENCOUNTER — Encounter: Payer: Self-pay | Admitting: Internal Medicine

## 2019-11-03 DIAGNOSIS — M79641 Pain in right hand: Secondary | ICD-10-CM | POA: Insufficient documentation

## 2019-11-03 NOTE — Assessment & Plan Note (Signed)
Rx for prednisone with refills sent in prior to visit. Reminded about the risk of bone loss and diabetes with long term prednisone and he agrees and wants to continue therapy as he gets such significant relief and improvement in QOL and would not want to live in such horrific pain all the time as when he is without the prednisone.

## 2019-11-03 NOTE — Assessment & Plan Note (Signed)
We have agreed to continue his prednisone as needed and prescribe his oxycodone 15 mg #150 per month until he can get in with new pain clinic. [place] narcotic database reviewed and his story matches fill pattern. No history of overuse or abuse. Does have significant and documented pathology to explain his pain.

## 2019-11-03 NOTE — Assessment & Plan Note (Signed)
We have agreed to take on his oxycodone #150 per month for maximum 3 months until he can get in to a pain clinic different one. Rx today for oxycodone 15 mg #150 for up to 5 pills per day.

## 2019-11-10 DIAGNOSIS — Z96642 Presence of left artificial hip joint: Secondary | ICD-10-CM | POA: Diagnosis not present

## 2019-11-10 DIAGNOSIS — Z79899 Other long term (current) drug therapy: Secondary | ICD-10-CM | POA: Diagnosis not present

## 2019-11-10 DIAGNOSIS — M129 Arthropathy, unspecified: Secondary | ICD-10-CM | POA: Diagnosis not present

## 2019-11-10 DIAGNOSIS — M542 Cervicalgia: Secondary | ICD-10-CM | POA: Diagnosis not present

## 2019-11-10 DIAGNOSIS — M546 Pain in thoracic spine: Secondary | ICD-10-CM | POA: Diagnosis not present

## 2019-11-10 DIAGNOSIS — E559 Vitamin D deficiency, unspecified: Secondary | ICD-10-CM | POA: Diagnosis not present

## 2019-11-10 DIAGNOSIS — G809 Cerebral palsy, unspecified: Secondary | ICD-10-CM | POA: Diagnosis not present

## 2019-11-11 ENCOUNTER — Other Ambulatory Visit: Payer: Self-pay | Admitting: Internal Medicine

## 2019-11-11 NOTE — Chronic Care Management (AMB) (Deleted)
Chronic Care Management Pharmacy  Name: [name]  MRN: [id number] DOB: [birth year]   Chief Complaint/ HPI  [name],  80 y.o. , male presents for their Initial CCM visit with the clinical pharmacist via telephone due to COVID-19 Pandemic.  PCP : [name], MD Patient Care Team: [name], MD as PCP - General (Internal Medicine) Stanford Breed Denice Bors, MD as PCP - Cardiology (Cardiology) Stanford Breed Denice Bors, MD as Consulting Physician (Cardiology) Rexene Alberts, MD as Consulting Physician (Cardiothoracic Surgery) Rolan Bucco, MD (Urology) Brandy Hale, MD as Consulting Physician (Anesthesiology) Charlton Haws, [facility] as Pharmacist (Pharmacist)  Their chronic conditions include: Hypertension, Hyperlipidemia, Atrial Fibrillation, Heart Failure, Coronary Artery Disease, COPD, Chronic Kidney Disease and Chronic back pain, cirrhosis, PAD, bladder outlet obstruction  Office Visits: 11/02/19 Dr Sharlet Salina OV: hand pain, chronic back pain, wants new pain mgmt clinic. Prednisone for hand pain - warned for long term risks. Prescribing oxycodone #150/month until he can get in with new pain clinic - max 3 months.  08/09/19 Dr Ronnald Ramp OV: Fall 5 days ago, chest wall bruising. CXR negative for fracture.  07/27/19 Dr Ronnald Ramp OV: PAD, f/u with wound care and [facility].  06/28/19 Dr Sharlet Salina OV: Rx bactrim for cellulitis  Consult Visit: 09/29/19 PA Laurence Slate ([facility]): LE swelling and ulceration eval f/u - venous reflux study. Measured for knee high compress, cannot tolerate tight socks.  09/05/19 Dr Trula Slade ([facility]): LE swelling/ulcer eval. Wearing [place] boots. No med changes.  08/19/19 PA Kerin Ransom [facility] (cardiology): CABG 2010, Afib ablation 2016. Conditions stable, no med changes.  Allergies  Allergen Reactions  . Doxycycline   . Gabapentin Other (See Comments)    Pt states make him go down to the floor and can not get up  . Amlodipine Nausea And Vomiting and Other  (See Comments)    dizziness  . Fish Allergy Nausea And Vomiting    STATES HE HAS NOT EATEN ANY FISH INCLUDING SHELLFISH FOR PAST 40 YRS - IT CAUSED NAUSEA  . Hydrocodone Itching and Rash  . Other Nausea And Vomiting    STATES HE HAS NOT EATEN ANY FISH INCLUDING SHELLFISH FOR PAST 40 YRS - IT CAUSED NAUSEA  . Tylenol [Acetaminophen] Other (See Comments)    Liver problems    Medications: Outpatient Encounter Medications as of 11/14/2019  Medication Sig  . albuterol (VENTOLIN HFA) 108 (90 Base) MCG/ACT inhaler TAKE 2 PUFFS BY MOUTH EVERY 6 HOURS AS NEEDED FOR WHEEZE OR SHORTNESS OF BREATH  . apixaban (ELIQUIS) 5 MG TABS tablet Take 1 tablet (5 mg total) by mouth 2 (two) times daily.  [name] b complex vitamins tablet Take 1 tablet by mouth daily.  . Cholecalciferol (VITAMIN D3 PO) Take by mouth.  . diclofenac Sodium (VOLTAREN) 1 % GEL   . famotidine (PEPCID) 40 MG tablet Take 1 tablet (40 mg total) by mouth daily.  . ferrous sulfate 325 (65 FE) MG tablet Take 325 mg by mouth daily with breakfast.  . finasteride (PROSCAR) 5 MG tablet TAKE 1 TABLET BY MOUTH EVERY DAY  . fluticasone (FLONASE) 50 MCG/ACT nasal spray PLACE 1 SPRAY INTO BOTH NOSTRILS EVERY MORNING.  . furosemide (LASIX) 80 MG tablet Take 1 tablet (80 mg total) by mouth daily.  . Guaifenesin 1200 MG TB12 Take 1 tablet (1,200 mg total) by mouth 2 (two) times daily. (Patient taking differently: Take 1,200 mg by mouth 2 (two) times daily as needed (mucus). )  . lactulose ([place]) 10 GM/15ML  solution TAKE 30 MLS BY MOUTH 2 TIMES DAILY AS NEEDED FOR MILD CONSTIPATION OR MODERATE CONSTIPATION (Patient taking differently: Take 20 g by mouth 2 (two) times daily as needed for mild constipation. )  . lidocaine (XYLOCAINE) 5 % ointment Apply 1 application topically as needed.  . Multiple Vitamins-Minerals (ZINC PO) Take by mouth.  . Naloxone HCl (NARCAN NA) Place 1 application into the nose once.  . nystatin ointment (MYCOSTATIN) APPLY TO  AFFECTED AREA TWICE A DAY  . oxyCODONE (ROXICODONE) 15 MG immediate release tablet Take 1 tablet (15 mg total) by mouth every 4 (four) hours as needed for pain.  . potassium chloride SA (KLOR-CON M20) 20 MEQ tablet Take 1 tablet (20 mEq total) by mouth daily.  . pravastatin (PRAVACHOL) 20 MG tablet TAKE 1 TABLET BY MOUTH EVERY DAY  . predniSONE (DELTASONE) 10 MG tablet Take 2 tablets (20 mg total) by mouth daily with breakfast.  . spironolactone (ALDACTONE) 25 MG tablet TAKE 1 TABLET BY MOUTH EVERY DAY  . tamsulosin (FLOMAX) 0.4 MG CAPS capsule TAKE 1 CAPSULE BY MOUTH EVERY DAY   No facility-administered encounter medications on file as of 11/14/2019.     Current Diagnosis/Assessment:    Goals Addressed   None     Heart Failure / Hypertension   Type: Diastolic Last ejection fraction: >65% (06/23/2018)  BP goal is:  <140/90   BP Readings from Last 3 Encounters:  11/02/19 136/86  09/29/19 (!) 153/54  09/05/19 (!) 147/63   Kidney Function Lab Results  Component Value Date/Time   CREATININE 1.77 (H) 04/05/2019 03:51 PM   CREATININE 1.89 (H) 01/14/2019 02:42 PM   CREATININE 1.72 (H) 04/09/2015 04:43 PM   CREATININE 1.37 (H) 02/13/2015 11:57 AM   GFR 24.21 (L) 01/12/2019 03:17 PM   GFRNONAA 36 (L) 04/05/2019 03:51 PM   GFRAA 42 (L) 04/05/2019 03:51 PM   K 3.9 04/05/2019 03:51 PM   K 3.5 01/14/2019 02:42 PM   Patient has failed these meds in past: amlodipine, hydralazine, losartan, metoprolol,  Patient is currently {CHL Controlled/Uncontrolled:[phone]} on the following medications:  . Spironolactone 25 mg daily . Furosemide 80 mg daily . Potassium 20 mEq daily  We discussed {CHL HP Upstream Pharmacy discussion:[phone]}  Plan  Continue {CHL HP Upstream Pharmacy Plans:[phone]}  AFIB   Patient is currently rhythm controlled. Office heart rates are  Pulse Readings from Last 3 Encounters:  11/02/19 73  09/29/19 64  09/05/19 (!) 55   Lab Results  Component  Value Date   CREATININE 1.77 (H) 04/05/2019   CREATININE 1.89 (H) 01/14/2019   CREATININE 2.59 (H) 01/12/2019   Wt Readings from Last 3 Encounters:  09/29/19 165 lb (74.8 kg)  09/05/19 166 lb (75.3 kg)  08/19/19 166 lb (75.3 kg)   Patient has failed these meds in past: metoprolol Patient is currently {CHL Controlled/Uncontrolled:[phone]} on the following medications:  . Eliquis 5 mg BID  We discussed:  {CHL HP Upstream Pharmacy discussion:[phone]}  Plan  Continue {CHL HP Upstream Pharmacy Plans:[phone]}  Hyperlipidemia / CAD   LDL goal < 70 Hx CABG (2010)  Lipid Panel     Component Value Date/Time   CHOL 125 03/16/2017 1216   TRIG 96 03/16/2017 1216   HDL 42 03/16/2017 1216   LDLCALC 64 03/16/2017 1216    Hepatic Function Latest Ref Rng & Units 04/05/2019 01/14/2019 01/12/2019  Total Protein 6.0 - 8.5 g/dL 6.9 8.3(H) 7.8  Albumin 3.7 - 4.7 g/dL 3.8 3.8 3.8  AST 0 -  40 IU/L _0 ALT 0 - 44 IU/L _1 Alk Phosphatase 39 - 117 IU/L 132(H) 123 131(H)  Total Bilirubin 0.0 - 1.2 mg/dL 0.8 1.1 0.9  Bilirubin, Direct 0.00 - 0.40 mg/dL - - -    The ASCVD Risk score Mikey Bussing DC Jr., et al., 2013) failed to calculate for the following reasons:   The valid total cholesterol range is 130 to 320 mg/dL   Patient has failed these meds in past: atorvastatin Patient is currently {CHL Controlled/Uncontrolled:4506354955} on the following medications:  . Pravastatin 20 mg daily  We discussed:  {CHL HP Upstream Pharmacy [phone]}  Plan  Continue {CHL HP Upstream Pharmacy Plans:7822419430}  GERD   Patient has failed these meds in past: *** Patient is currently {CHL Controlled/Uncontrolled:4506354955} on the following medications:  . Famotidine 40 mg daily  We discussed:  ***  Plan  Continue {CHL HP Upstream Pharmacy Plans:7822419430}  Cirrhosis   Patient has failed these meds in past: *** Patient is currently {CHL  Controlled/Uncontrolled:4506354955} on the following medications:  . Lactulose 20 g (30 mLs) BID PRN  We discussed:  ***  Plan  Continue {CHL HP Upstream Pharmacy XBDZH:[id number]}  Bladder outlet obstruction   PSA  Date Value Ref Range Status  10/21/2017 10.64 (H) 0.10 - 4.00 ng/mL Final  09/16/2017 7.84 (H) 0.10 - 4.00 ng/mL Final   Patient has failed these meds in past: *** Patient is currently {CHL Controlled/Uncontrolled:4506354955} on the following medications:  . Finasteride 5 mg daily . Tamsulosin 0.4 mg daily  We discussed:  ***  Plan  Continue {CHL HP Upstream Pharmacy Plans:7822419430}  Anemia   CBC Latest Ref Rng & Units 01/14/2019 01/12/2019 01/05/2019  WBC 4.0 - 10.5 K/uL 7.4 8.9 8.3  Hemoglobin 13.0 - 17.0 g/dL 12.9(L) 12.1(L) 12.4(L)  Hematocrit 39 - 52 % 40.1 36.6(L) 38.3(L)  Platelets 150 - 400 K/uL 251 244.0 264.0   Iron/TIBC/Ferritin/ %Sat No results found for: IRON, TIBC, FERRITIN, IRONPCTSAT  Patient has failed these meds in past: *** Patient is currently {CHL Controlled/Uncontrolled:4506354955} on the following medications:  . Ferrous sulfate 325 mg daily  We discussed:  ***  Plan  Continue {CHL HP Upstream Pharmacy Plans:7822419430}  Chronic back pain   Patient has failed these meds in past: *** Patient is currently {CHL Controlled/Uncontrolled:4506354955} on the following medications:  . Oxycodone IR 15 mg q4h PRN . Diclofenac 1% gel . Lidocaine 5% ointment  We discussed:  ***  Plan  Continue {CHL HP Upstream Pharmacy HDQQI:[id number]}  Depression/Insomnia?***   Depression screen [facility] 2/9 07/08/2019 07/07/2019 07/05/2018  Decreased Interest 0 0 0  Down, Depressed, Hopeless 0 0 0  PHQ - 2 Score 0 0 0  Some recent data might be hidden   Patient has failed these meds in past: *** Patient is currently {CHL Controlled/Uncontrolled:4506354955} on the following medications:  [name] Mirtazapine 30 mg HS . Quetiapine 100 mg  . Trazodone  100 mg  We discussed:  ***  Per Dr Lynnda Shields (pain mgmt)  Plan  Continue {CHL HP Upstream Pharmacy ERDEY:[id number]}  Opioid dependence   Per Dr Lynnda Shields (pain mgmt)  Patient has failed these meds in past: *** Patient is currently {CHL Controlled/Uncontrolled:4506354955} on the following medications:  . Buprenorphine 8 mg SL prn . Buprenorphine-naloxone 8-2 mg film PRN  We discussed:  ***  Plan  Continue {CHL HP Upstream Pharmacy Plans:7822419430}  COPD / Allergies   Last spirometry score: n/a  Patient has failed these meds in past: ***  Patient is currently {CHL Controlled/[phone]} on the following medications:  . Albuterol HFA prn . Fluticasone nasal spray PRN . Mucinex PRN  Using maintenance inhaler regularly? {yes/no:20286} Frequency of rescue inhaler use:  {CHL HP Upstream Pharm Inhaler ECXF:0722575051}  We discussed:  {CHL HP Upstream Pharmacy discussion:[phone]}  Plan  Continue {CHL HP Upstream Pharmacy GZFPO:2518984210}  ***   Patient has failed these meds in past: *** Patient is currently {CHL Controlled/[phone]} on the following medications:  . Prednisone 20 mg daily  We discussed:  ***  Plan  Continue {CHL HP Upstream Pharmacy Plans:[phone]}  Health Maintenance   Patient is currently {CHL Controlled/[phone]} on the following medications:  [name] Vitamin B complex . Vitamin D3 . Multivitamin w/ zinc . Nystatin ointment  We discussed:  ***  Plan  Continue {CHL HP Upstream Pharmacy Plans:[phone]}  Medication Management   Pt uses [facility] for all medications Uses pill box? {Yes or If no, why not?:20788} Pt endorses ***% compliance  We discussed: ***  Plan  {[facility] Pharmacy ZXYO:11886}    Follow up: *** month phone visit  ***

## 2019-11-14 ENCOUNTER — Telehealth: Payer: Medicare Other

## 2019-11-15 ENCOUNTER — Telehealth: Payer: Self-pay | Admitting: Internal Medicine

## 2019-11-15 DIAGNOSIS — G8929 Other chronic pain: Secondary | ICD-10-CM

## 2019-11-15 DIAGNOSIS — M5489 Other dorsalgia: Secondary | ICD-10-CM

## 2019-11-15 NOTE — Telephone Encounter (Signed)
New message:   Pt is calling and wants to know what can be done for his leg weakness. He states he would like to know what Dr. Sharlet Salina thinks he should do. I offered an appt and he states he wanted to know what Dr. Sharlet Salina thinks he should do first or what she thinks it may be. Please advise.

## 2019-11-16 NOTE — Telephone Encounter (Signed)
Likely some of this is related to his chronic back problems. We could try home PT if he wants.

## 2019-11-16 NOTE — Telephone Encounter (Signed)
Pt would like to proceed 

## 2019-11-17 NOTE — Addendum Note (Signed)
Addended by: Aviva Signs M on: 11/17/2019 11:29 AM   Modules accepted: Orders

## 2019-11-17 NOTE — Addendum Note (Signed)
Addended by: [name] A on: 11/17/2019 02:53 PM   Modules accepted: Orders

## 2019-11-17 NOTE — Telephone Encounter (Signed)
Referral to home health placed. 

## 2019-11-18 ENCOUNTER — Encounter: Payer: Self-pay | Admitting: Internal Medicine

## 2019-11-29 NOTE — Telephone Encounter (Signed)
I have reprinted the letter and will get PCP to sign tomorrow when she is back in office.

## 2019-11-29 NOTE — Telephone Encounter (Signed)
New Message:   Pt is calling and states he never received the letter from [place] for the postal services he is requesting. Pt would like [place] to mail this again for him. Please advise I have verified his address and it is correct.

## 2019-11-30 ENCOUNTER — Telehealth: Payer: Self-pay

## 2019-11-30 NOTE — Telephone Encounter (Signed)
New message    The is asking postal letter that Dr. Sharlet Salina would be signing off to mailed to his home.

## 2019-11-30 NOTE — Telephone Encounter (Signed)
Letter has been mail again

## 2019-12-01 DIAGNOSIS — Z79899 Other long term (current) drug therapy: Secondary | ICD-10-CM | POA: Diagnosis not present

## 2019-12-01 DIAGNOSIS — M62830 Muscle spasm of back: Secondary | ICD-10-CM | POA: Diagnosis not present

## 2019-12-01 DIAGNOSIS — M5416 Radiculopathy, lumbar region: Secondary | ICD-10-CM | POA: Diagnosis not present

## 2019-12-01 DIAGNOSIS — M542 Cervicalgia: Secondary | ICD-10-CM | POA: Diagnosis not present

## 2019-12-01 DIAGNOSIS — M546 Pain in thoracic spine: Secondary | ICD-10-CM | POA: Diagnosis not present

## 2019-12-05 DIAGNOSIS — I739 Peripheral vascular disease, unspecified: Secondary | ICD-10-CM | POA: Diagnosis not present

## 2019-12-05 DIAGNOSIS — I2510 Atherosclerotic heart disease of native coronary artery without angina pectoris: Secondary | ICD-10-CM | POA: Diagnosis not present

## 2019-12-05 DIAGNOSIS — G8929 Other chronic pain: Secondary | ICD-10-CM | POA: Diagnosis not present

## 2019-12-05 DIAGNOSIS — E785 Hyperlipidemia, unspecified: Secondary | ICD-10-CM | POA: Diagnosis not present

## 2019-12-05 DIAGNOSIS — Z951 Presence of aortocoronary bypass graft: Secondary | ICD-10-CM | POA: Diagnosis not present

## 2019-12-05 DIAGNOSIS — I441 Atrioventricular block, second degree: Secondary | ICD-10-CM | POA: Diagnosis not present

## 2019-12-05 DIAGNOSIS — I350 Nonrheumatic aortic (valve) stenosis: Secondary | ICD-10-CM | POA: Diagnosis not present

## 2019-12-05 DIAGNOSIS — I482 Chronic atrial fibrillation, unspecified: Secondary | ICD-10-CM | POA: Diagnosis not present

## 2019-12-05 DIAGNOSIS — I4892 Unspecified atrial flutter: Secondary | ICD-10-CM | POA: Diagnosis not present

## 2019-12-05 DIAGNOSIS — Z7952 Long term (current) use of systemic steroids: Secondary | ICD-10-CM | POA: Diagnosis not present

## 2019-12-05 DIAGNOSIS — Z7901 Long term (current) use of anticoagulants: Secondary | ICD-10-CM | POA: Diagnosis not present

## 2019-12-05 DIAGNOSIS — I5032 Chronic diastolic (congestive) heart failure: Secondary | ICD-10-CM | POA: Diagnosis not present

## 2019-12-05 DIAGNOSIS — Z85038 Personal history of other malignant neoplasm of large intestine: Secondary | ICD-10-CM | POA: Diagnosis not present

## 2019-12-05 DIAGNOSIS — K7460 Unspecified cirrhosis of liver: Secondary | ICD-10-CM | POA: Diagnosis not present

## 2019-12-05 DIAGNOSIS — I130 Hypertensive heart and chronic kidney disease with heart failure and stage 1 through stage 4 chronic kidney disease, or unspecified chronic kidney disease: Secondary | ICD-10-CM | POA: Diagnosis not present

## 2019-12-05 DIAGNOSIS — N183 Chronic kidney disease, stage 3 unspecified: Secondary | ICD-10-CM | POA: Diagnosis not present

## 2019-12-05 DIAGNOSIS — Z87891 Personal history of nicotine dependence: Secondary | ICD-10-CM | POA: Diagnosis not present

## 2019-12-05 DIAGNOSIS — J449 Chronic obstructive pulmonary disease, unspecified: Secondary | ICD-10-CM | POA: Diagnosis not present

## 2019-12-05 DIAGNOSIS — D631 Anemia in chronic kidney disease: Secondary | ICD-10-CM | POA: Diagnosis not present

## 2019-12-05 DIAGNOSIS — Z9181 History of falling: Secondary | ICD-10-CM | POA: Diagnosis not present

## 2019-12-05 DIAGNOSIS — Z79891 Long term (current) use of opiate analgesic: Secondary | ICD-10-CM | POA: Diagnosis not present

## 2019-12-07 ENCOUNTER — Other Ambulatory Visit: Payer: Self-pay | Admitting: Cardiology

## 2019-12-07 NOTE — Telephone Encounter (Signed)
Rx has been sent to the pharmacy electronically. ° °

## 2019-12-13 DIAGNOSIS — Z951 Presence of aortocoronary bypass graft: Secondary | ICD-10-CM

## 2019-12-13 DIAGNOSIS — J449 Chronic obstructive pulmonary disease, unspecified: Secondary | ICD-10-CM

## 2019-12-13 DIAGNOSIS — D631 Anemia in chronic kidney disease: Secondary | ICD-10-CM

## 2019-12-13 DIAGNOSIS — I441 Atrioventricular block, second degree: Secondary | ICD-10-CM

## 2019-12-13 DIAGNOSIS — Z79891 Long term (current) use of opiate analgesic: Secondary | ICD-10-CM

## 2019-12-13 DIAGNOSIS — G8929 Other chronic pain: Secondary | ICD-10-CM | POA: Diagnosis not present

## 2019-12-13 DIAGNOSIS — I482 Chronic atrial fibrillation, unspecified: Secondary | ICD-10-CM

## 2019-12-13 DIAGNOSIS — N183 Chronic kidney disease, stage 3 unspecified: Secondary | ICD-10-CM | POA: Diagnosis not present

## 2019-12-13 DIAGNOSIS — Z7901 Long term (current) use of anticoagulants: Secondary | ICD-10-CM

## 2019-12-13 DIAGNOSIS — I5032 Chronic diastolic (congestive) heart failure: Secondary | ICD-10-CM | POA: Diagnosis not present

## 2019-12-13 DIAGNOSIS — I2510 Atherosclerotic heart disease of native coronary artery without angina pectoris: Secondary | ICD-10-CM

## 2019-12-13 DIAGNOSIS — E785 Hyperlipidemia, unspecified: Secondary | ICD-10-CM

## 2019-12-13 DIAGNOSIS — Z7952 Long term (current) use of systemic steroids: Secondary | ICD-10-CM

## 2019-12-13 DIAGNOSIS — I130 Hypertensive heart and chronic kidney disease with heart failure and stage 1 through stage 4 chronic kidney disease, or unspecified chronic kidney disease: Secondary | ICD-10-CM | POA: Diagnosis not present

## 2019-12-13 DIAGNOSIS — F1120 Opioid dependence, uncomplicated: Secondary | ICD-10-CM

## 2019-12-13 DIAGNOSIS — I350 Nonrheumatic aortic (valve) stenosis: Secondary | ICD-10-CM

## 2019-12-13 DIAGNOSIS — K7460 Unspecified cirrhosis of liver: Secondary | ICD-10-CM

## 2019-12-13 DIAGNOSIS — Z87891 Personal history of nicotine dependence: Secondary | ICD-10-CM

## 2019-12-13 DIAGNOSIS — Z9181 History of falling: Secondary | ICD-10-CM

## 2019-12-13 DIAGNOSIS — I4892 Unspecified atrial flutter: Secondary | ICD-10-CM

## 2019-12-13 DIAGNOSIS — I739 Peripheral vascular disease, unspecified: Secondary | ICD-10-CM

## 2019-12-13 DIAGNOSIS — Z85038 Personal history of other malignant neoplasm of large intestine: Secondary | ICD-10-CM

## 2019-12-14 DIAGNOSIS — Z7952 Long term (current) use of systemic steroids: Secondary | ICD-10-CM | POA: Diagnosis not present

## 2019-12-14 DIAGNOSIS — D631 Anemia in chronic kidney disease: Secondary | ICD-10-CM | POA: Diagnosis not present

## 2019-12-14 DIAGNOSIS — I5032 Chronic diastolic (congestive) heart failure: Secondary | ICD-10-CM | POA: Diagnosis not present

## 2019-12-14 DIAGNOSIS — I130 Hypertensive heart and chronic kidney disease with heart failure and stage 1 through stage 4 chronic kidney disease, or unspecified chronic kidney disease: Secondary | ICD-10-CM | POA: Diagnosis not present

## 2019-12-14 DIAGNOSIS — J449 Chronic obstructive pulmonary disease, unspecified: Secondary | ICD-10-CM | POA: Diagnosis not present

## 2019-12-14 DIAGNOSIS — I4892 Unspecified atrial flutter: Secondary | ICD-10-CM | POA: Diagnosis not present

## 2019-12-14 DIAGNOSIS — Z7901 Long term (current) use of anticoagulants: Secondary | ICD-10-CM | POA: Diagnosis not present

## 2019-12-14 DIAGNOSIS — Z79891 Long term (current) use of opiate analgesic: Secondary | ICD-10-CM | POA: Diagnosis not present

## 2019-12-14 DIAGNOSIS — I441 Atrioventricular block, second degree: Secondary | ICD-10-CM | POA: Diagnosis not present

## 2019-12-14 DIAGNOSIS — I482 Chronic atrial fibrillation, unspecified: Secondary | ICD-10-CM | POA: Diagnosis not present

## 2019-12-14 DIAGNOSIS — I350 Nonrheumatic aortic (valve) stenosis: Secondary | ICD-10-CM | POA: Diagnosis not present

## 2019-12-14 DIAGNOSIS — N183 Chronic kidney disease, stage 3 unspecified: Secondary | ICD-10-CM | POA: Diagnosis not present

## 2019-12-14 DIAGNOSIS — K7460 Unspecified cirrhosis of liver: Secondary | ICD-10-CM | POA: Diagnosis not present

## 2019-12-14 DIAGNOSIS — E785 Hyperlipidemia, unspecified: Secondary | ICD-10-CM | POA: Diagnosis not present

## 2019-12-14 DIAGNOSIS — Z951 Presence of aortocoronary bypass graft: Secondary | ICD-10-CM | POA: Diagnosis not present

## 2019-12-14 DIAGNOSIS — Z85038 Personal history of other malignant neoplasm of large intestine: Secondary | ICD-10-CM | POA: Diagnosis not present

## 2019-12-14 DIAGNOSIS — Z9181 History of falling: Secondary | ICD-10-CM | POA: Diagnosis not present

## 2019-12-14 DIAGNOSIS — I2510 Atherosclerotic heart disease of native coronary artery without angina pectoris: Secondary | ICD-10-CM | POA: Diagnosis not present

## 2019-12-14 DIAGNOSIS — I739 Peripheral vascular disease, unspecified: Secondary | ICD-10-CM | POA: Diagnosis not present

## 2019-12-14 DIAGNOSIS — Z87891 Personal history of nicotine dependence: Secondary | ICD-10-CM | POA: Diagnosis not present

## 2019-12-14 DIAGNOSIS — G8929 Other chronic pain: Secondary | ICD-10-CM | POA: Diagnosis not present

## 2019-12-15 DIAGNOSIS — Z951 Presence of aortocoronary bypass graft: Secondary | ICD-10-CM | POA: Diagnosis not present

## 2019-12-15 DIAGNOSIS — I441 Atrioventricular block, second degree: Secondary | ICD-10-CM | POA: Diagnosis not present

## 2019-12-15 DIAGNOSIS — I4892 Unspecified atrial flutter: Secondary | ICD-10-CM | POA: Diagnosis not present

## 2019-12-15 DIAGNOSIS — I2510 Atherosclerotic heart disease of native coronary artery without angina pectoris: Secondary | ICD-10-CM | POA: Diagnosis not present

## 2019-12-15 DIAGNOSIS — I739 Peripheral vascular disease, unspecified: Secondary | ICD-10-CM | POA: Diagnosis not present

## 2019-12-15 DIAGNOSIS — Z85038 Personal history of other malignant neoplasm of large intestine: Secondary | ICD-10-CM | POA: Diagnosis not present

## 2019-12-15 DIAGNOSIS — I5032 Chronic diastolic (congestive) heart failure: Secondary | ICD-10-CM | POA: Diagnosis not present

## 2019-12-15 DIAGNOSIS — I130 Hypertensive heart and chronic kidney disease with heart failure and stage 1 through stage 4 chronic kidney disease, or unspecified chronic kidney disease: Secondary | ICD-10-CM | POA: Diagnosis not present

## 2019-12-15 DIAGNOSIS — Z7901 Long term (current) use of anticoagulants: Secondary | ICD-10-CM | POA: Diagnosis not present

## 2019-12-15 DIAGNOSIS — Z7952 Long term (current) use of systemic steroids: Secondary | ICD-10-CM | POA: Diagnosis not present

## 2019-12-15 DIAGNOSIS — N183 Chronic kidney disease, stage 3 unspecified: Secondary | ICD-10-CM | POA: Diagnosis not present

## 2019-12-15 DIAGNOSIS — I350 Nonrheumatic aortic (valve) stenosis: Secondary | ICD-10-CM | POA: Diagnosis not present

## 2019-12-15 DIAGNOSIS — E785 Hyperlipidemia, unspecified: Secondary | ICD-10-CM | POA: Diagnosis not present

## 2019-12-15 DIAGNOSIS — G8929 Other chronic pain: Secondary | ICD-10-CM | POA: Diagnosis not present

## 2019-12-15 DIAGNOSIS — Z9181 History of falling: Secondary | ICD-10-CM | POA: Diagnosis not present

## 2019-12-15 DIAGNOSIS — Z79891 Long term (current) use of opiate analgesic: Secondary | ICD-10-CM | POA: Diagnosis not present

## 2019-12-15 DIAGNOSIS — J449 Chronic obstructive pulmonary disease, unspecified: Secondary | ICD-10-CM | POA: Diagnosis not present

## 2019-12-15 DIAGNOSIS — K7460 Unspecified cirrhosis of liver: Secondary | ICD-10-CM | POA: Diagnosis not present

## 2019-12-15 DIAGNOSIS — D631 Anemia in chronic kidney disease: Secondary | ICD-10-CM | POA: Diagnosis not present

## 2019-12-15 DIAGNOSIS — Z87891 Personal history of nicotine dependence: Secondary | ICD-10-CM | POA: Diagnosis not present

## 2019-12-15 DIAGNOSIS — I482 Chronic atrial fibrillation, unspecified: Secondary | ICD-10-CM | POA: Diagnosis not present

## 2019-12-19 DIAGNOSIS — Z79891 Long term (current) use of opiate analgesic: Secondary | ICD-10-CM | POA: Diagnosis not present

## 2019-12-19 DIAGNOSIS — Z85038 Personal history of other malignant neoplasm of large intestine: Secondary | ICD-10-CM | POA: Diagnosis not present

## 2019-12-19 DIAGNOSIS — K7460 Unspecified cirrhosis of liver: Secondary | ICD-10-CM | POA: Diagnosis not present

## 2019-12-19 DIAGNOSIS — I2510 Atherosclerotic heart disease of native coronary artery without angina pectoris: Secondary | ICD-10-CM | POA: Diagnosis not present

## 2019-12-19 DIAGNOSIS — Z87891 Personal history of nicotine dependence: Secondary | ICD-10-CM | POA: Diagnosis not present

## 2019-12-19 DIAGNOSIS — Z951 Presence of aortocoronary bypass graft: Secondary | ICD-10-CM | POA: Diagnosis not present

## 2019-12-19 DIAGNOSIS — I130 Hypertensive heart and chronic kidney disease with heart failure and stage 1 through stage 4 chronic kidney disease, or unspecified chronic kidney disease: Secondary | ICD-10-CM | POA: Diagnosis not present

## 2019-12-19 DIAGNOSIS — D631 Anemia in chronic kidney disease: Secondary | ICD-10-CM | POA: Diagnosis not present

## 2019-12-19 DIAGNOSIS — I482 Chronic atrial fibrillation, unspecified: Secondary | ICD-10-CM | POA: Diagnosis not present

## 2019-12-19 DIAGNOSIS — I4892 Unspecified atrial flutter: Secondary | ICD-10-CM | POA: Diagnosis not present

## 2019-12-19 DIAGNOSIS — I350 Nonrheumatic aortic (valve) stenosis: Secondary | ICD-10-CM | POA: Diagnosis not present

## 2019-12-19 DIAGNOSIS — Z7952 Long term (current) use of systemic steroids: Secondary | ICD-10-CM | POA: Diagnosis not present

## 2019-12-19 DIAGNOSIS — G8929 Other chronic pain: Secondary | ICD-10-CM | POA: Diagnosis not present

## 2019-12-19 DIAGNOSIS — E785 Hyperlipidemia, unspecified: Secondary | ICD-10-CM | POA: Diagnosis not present

## 2019-12-19 DIAGNOSIS — Z9181 History of falling: Secondary | ICD-10-CM | POA: Diagnosis not present

## 2019-12-19 DIAGNOSIS — Z7901 Long term (current) use of anticoagulants: Secondary | ICD-10-CM | POA: Diagnosis not present

## 2019-12-19 DIAGNOSIS — I739 Peripheral vascular disease, unspecified: Secondary | ICD-10-CM | POA: Diagnosis not present

## 2019-12-19 DIAGNOSIS — I441 Atrioventricular block, second degree: Secondary | ICD-10-CM | POA: Diagnosis not present

## 2019-12-19 DIAGNOSIS — J449 Chronic obstructive pulmonary disease, unspecified: Secondary | ICD-10-CM | POA: Diagnosis not present

## 2019-12-19 DIAGNOSIS — N183 Chronic kidney disease, stage 3 unspecified: Secondary | ICD-10-CM | POA: Diagnosis not present

## 2019-12-19 DIAGNOSIS — I5032 Chronic diastolic (congestive) heart failure: Secondary | ICD-10-CM | POA: Diagnosis not present

## 2019-12-22 DIAGNOSIS — G8929 Other chronic pain: Secondary | ICD-10-CM | POA: Diagnosis not present

## 2019-12-22 DIAGNOSIS — Z87891 Personal history of nicotine dependence: Secondary | ICD-10-CM | POA: Diagnosis not present

## 2019-12-22 DIAGNOSIS — I441 Atrioventricular block, second degree: Secondary | ICD-10-CM | POA: Diagnosis not present

## 2019-12-22 DIAGNOSIS — I2510 Atherosclerotic heart disease of native coronary artery without angina pectoris: Secondary | ICD-10-CM | POA: Diagnosis not present

## 2019-12-22 DIAGNOSIS — I739 Peripheral vascular disease, unspecified: Secondary | ICD-10-CM | POA: Diagnosis not present

## 2019-12-22 DIAGNOSIS — Z85038 Personal history of other malignant neoplasm of large intestine: Secondary | ICD-10-CM | POA: Diagnosis not present

## 2019-12-22 DIAGNOSIS — K7460 Unspecified cirrhosis of liver: Secondary | ICD-10-CM | POA: Diagnosis not present

## 2019-12-22 DIAGNOSIS — Z79891 Long term (current) use of opiate analgesic: Secondary | ICD-10-CM | POA: Diagnosis not present

## 2019-12-22 DIAGNOSIS — I130 Hypertensive heart and chronic kidney disease with heart failure and stage 1 through stage 4 chronic kidney disease, or unspecified chronic kidney disease: Secondary | ICD-10-CM | POA: Diagnosis not present

## 2019-12-22 DIAGNOSIS — E785 Hyperlipidemia, unspecified: Secondary | ICD-10-CM | POA: Diagnosis not present

## 2019-12-22 DIAGNOSIS — Z7901 Long term (current) use of anticoagulants: Secondary | ICD-10-CM | POA: Diagnosis not present

## 2019-12-22 DIAGNOSIS — Z951 Presence of aortocoronary bypass graft: Secondary | ICD-10-CM | POA: Diagnosis not present

## 2019-12-22 DIAGNOSIS — D631 Anemia in chronic kidney disease: Secondary | ICD-10-CM | POA: Diagnosis not present

## 2019-12-22 DIAGNOSIS — Z7952 Long term (current) use of systemic steroids: Secondary | ICD-10-CM | POA: Diagnosis not present

## 2019-12-22 DIAGNOSIS — J449 Chronic obstructive pulmonary disease, unspecified: Secondary | ICD-10-CM | POA: Diagnosis not present

## 2019-12-22 DIAGNOSIS — I350 Nonrheumatic aortic (valve) stenosis: Secondary | ICD-10-CM | POA: Diagnosis not present

## 2019-12-22 DIAGNOSIS — N183 Chronic kidney disease, stage 3 unspecified: Secondary | ICD-10-CM | POA: Diagnosis not present

## 2019-12-22 DIAGNOSIS — I4892 Unspecified atrial flutter: Secondary | ICD-10-CM | POA: Diagnosis not present

## 2019-12-22 DIAGNOSIS — Z9181 History of falling: Secondary | ICD-10-CM | POA: Diagnosis not present

## 2019-12-22 DIAGNOSIS — I482 Chronic atrial fibrillation, unspecified: Secondary | ICD-10-CM | POA: Diagnosis not present

## 2019-12-22 DIAGNOSIS — I5032 Chronic diastolic (congestive) heart failure: Secondary | ICD-10-CM | POA: Diagnosis not present

## 2019-12-23 ENCOUNTER — Ambulatory Visit: Payer: Medicare Other | Admitting: Family

## 2019-12-27 ENCOUNTER — Ambulatory Visit (INDEPENDENT_AMBULATORY_CARE_PROVIDER_SITE_OTHER): Payer: Medicare Other | Admitting: Family

## 2019-12-27 ENCOUNTER — Other Ambulatory Visit: Payer: Self-pay | Admitting: Family

## 2019-12-27 ENCOUNTER — Other Ambulatory Visit: Payer: Self-pay

## 2019-12-27 VITALS — BP 130/70 | HR 79 | Temp 98.1°F | Ht 70.0 in

## 2019-12-27 DIAGNOSIS — L03119 Cellulitis of unspecified part of limb: Secondary | ICD-10-CM | POA: Diagnosis not present

## 2019-12-27 DIAGNOSIS — Z9181 History of falling: Secondary | ICD-10-CM | POA: Diagnosis not present

## 2019-12-27 DIAGNOSIS — Z85038 Personal history of other malignant neoplasm of large intestine: Secondary | ICD-10-CM | POA: Diagnosis not present

## 2019-12-27 DIAGNOSIS — D631 Anemia in chronic kidney disease: Secondary | ICD-10-CM | POA: Diagnosis not present

## 2019-12-27 DIAGNOSIS — Z96649 Presence of unspecified artificial hip joint: Secondary | ICD-10-CM

## 2019-12-27 DIAGNOSIS — Z79891 Long term (current) use of opiate analgesic: Secondary | ICD-10-CM | POA: Diagnosis not present

## 2019-12-27 DIAGNOSIS — M549 Dorsalgia, unspecified: Secondary | ICD-10-CM | POA: Diagnosis not present

## 2019-12-27 DIAGNOSIS — I441 Atrioventricular block, second degree: Secondary | ICD-10-CM | POA: Diagnosis not present

## 2019-12-27 DIAGNOSIS — Z87891 Personal history of nicotine dependence: Secondary | ICD-10-CM | POA: Diagnosis not present

## 2019-12-27 DIAGNOSIS — J42 Unspecified chronic bronchitis: Secondary | ICD-10-CM | POA: Diagnosis not present

## 2019-12-27 DIAGNOSIS — I4892 Unspecified atrial flutter: Secondary | ICD-10-CM | POA: Diagnosis not present

## 2019-12-27 DIAGNOSIS — I739 Peripheral vascular disease, unspecified: Secondary | ICD-10-CM | POA: Diagnosis not present

## 2019-12-27 DIAGNOSIS — T84028A Dislocation of other internal joint prosthesis, initial encounter: Secondary | ICD-10-CM

## 2019-12-27 DIAGNOSIS — I2510 Atherosclerotic heart disease of native coronary artery without angina pectoris: Secondary | ICD-10-CM | POA: Diagnosis not present

## 2019-12-27 DIAGNOSIS — Z7952 Long term (current) use of systemic steroids: Secondary | ICD-10-CM | POA: Diagnosis not present

## 2019-12-27 DIAGNOSIS — Z951 Presence of aortocoronary bypass graft: Secondary | ICD-10-CM | POA: Diagnosis not present

## 2019-12-27 DIAGNOSIS — K7460 Unspecified cirrhosis of liver: Secondary | ICD-10-CM | POA: Diagnosis not present

## 2019-12-27 DIAGNOSIS — G8929 Other chronic pain: Secondary | ICD-10-CM | POA: Diagnosis not present

## 2019-12-27 DIAGNOSIS — R2681 Unsteadiness on feet: Secondary | ICD-10-CM | POA: Diagnosis not present

## 2019-12-27 DIAGNOSIS — N183 Chronic kidney disease, stage 3 unspecified: Secondary | ICD-10-CM | POA: Diagnosis not present

## 2019-12-27 DIAGNOSIS — I482 Chronic atrial fibrillation, unspecified: Secondary | ICD-10-CM

## 2019-12-27 DIAGNOSIS — J449 Chronic obstructive pulmonary disease, unspecified: Secondary | ICD-10-CM | POA: Diagnosis not present

## 2019-12-27 DIAGNOSIS — I5032 Chronic diastolic (congestive) heart failure: Secondary | ICD-10-CM | POA: Diagnosis not present

## 2019-12-27 DIAGNOSIS — I130 Hypertensive heart and chronic kidney disease with heart failure and stage 1 through stage 4 chronic kidney disease, or unspecified chronic kidney disease: Secondary | ICD-10-CM | POA: Diagnosis not present

## 2019-12-27 DIAGNOSIS — I350 Nonrheumatic aortic (valve) stenosis: Secondary | ICD-10-CM | POA: Diagnosis not present

## 2019-12-27 DIAGNOSIS — E785 Hyperlipidemia, unspecified: Secondary | ICD-10-CM | POA: Diagnosis not present

## 2019-12-27 DIAGNOSIS — Z7901 Long term (current) use of anticoagulants: Secondary | ICD-10-CM | POA: Diagnosis not present

## 2019-12-27 MED ORDER — CEPHALEXIN 500 MG PO CAPS: 500.0000 mg | ORAL_CAPSULE | Freq: Three times a day (TID) | ORAL | 0 refills | Status: DC

## 2019-12-27 NOTE — Progress Notes (Signed)
[name] is a 79 y.o. male with the following history as recorded in EpicCare:  Patient Active Problem List   Diagnosis Date Noted  . Bilateral hand pain 11/03/2019  . Anticoagulated 08/19/2019  . PAD (peripheral artery disease) ([place]) 07/27/2019  . Ulcers of both lower legs, limited to breakdown of skin ([place]) 07/21/2019  . Olecranon bursitis of left elbow 07/02/2018  . Failed total hip arthroplasty with dislocation, initial encounter ([place]) 06/22/2018  . Chronic diastolic heart failure ([place]) 03/16/2017  . CKD (chronic kidney disease) stage 3, GFR 30-59 ml/min 03/16/2017  . Diastolic dysfunction 16/57/9038  . Opiate dependence ([place]) 02/13/2017  . Acute renal failure superimposed on stage 3 chronic kidney disease ([place]) 01/21/2017  . Chronic atrial fibrillation ([place]) 11/24/2014  . Aortic stenosis 03/24/2014  . Second degree AV block, Mobitz type I 09/06/2013  . Cirrhosis ([place]) 07/28/2013  . Bladder outlet obstruction 07/28/2013  . Impaired glucose metabolism   . Chronic back pain   . Benign neoplasm of colon 01/08/2011  . Abdominal aortic aneurysm ([place]) 08/12/2010  . TOBACCO USE, QUIT 04/11/2009  . Occlusion and stenosis of carotid artery 01/08/2009  . Hyperlipidemia 11/13/2008  . ANEMIA 11/13/2008  . Essential hypertension 11/13/2008  . Hx of CABG 11/13/2008  . COPD (chronic obstructive pulmonary disease) ([place]) 11/13/2008    Current Outpatient Medications  Medication Sig Dispense Refill  . albuterol (VENTOLIN HFA) 108 (90 Base) MCG/ACT inhaler TAKE 2 PUFFS BY MOUTH EVERY 6 HOURS AS NEEDED FOR WHEEZE OR SHORTNESS OF BREATH 18 g 1  . allopurinol (ZYLOPRIM) 100 MG tablet     . apixaban (ELIQUIS) 5 MG TABS tablet Take 1 tablet (5 mg total) by mouth 2 (two) times daily. 180 tablet 3  . b complex vitamins tablet Take 1 tablet by mouth daily.    . Cholecalciferol (VITAMIN D3 PO) Take by mouth.    . diclofenac Sodium (VOLTAREN) 1 % GEL     . famotidine (PEPCID) 40 MG tablet Take  1 tablet (40 mg total) by mouth daily. 90 tablet 3  . ferrous sulfate 325 (65 FE) MG tablet Take 325 mg by mouth daily with breakfast.    . finasteride (PROSCAR) 5 MG tablet TAKE 1 TABLET BY MOUTH EVERY DAY 90 tablet 1  . fluticasone (FLONASE) 50 MCG/ACT nasal spray PLACE 1 SPRAY INTO BOTH NOSTRILS EVERY MORNING. 48 mL 1  . furosemide (LASIX) 80 MG tablet TAKE 1 TABLET BY MOUTH EVERY DAY 90 tablet 1  . Guaifenesin 1200 MG TB12 Take 1 tablet (1,200 mg total) by mouth 2 (two) times daily. (Patient taking differently: Take 1,200 mg by mouth 2 (two) times daily as needed (mucus). ) 180 each 3  . KLOR-CON M20 20 MEQ tablet TAKE 1 TABLET BY MOUTH EVERY DAY 90 tablet 1  . lactulose (CHRONULAC) 10 GM/15ML solution TAKE 30 MLS BY MOUTH 2 TIMES DAILY AS NEEDED FOR MILD CONSTIPATION OR MODERATE CONSTIPATION (Patient taking differently: Take 20 g by mouth 2 (two) times daily as needed for mild constipation. ) 1892 mL 6  . lidocaine (XYLOCAINE) 5 % ointment Apply 1 application topically as needed. 240 g 6  . Multiple Vitamins-Minerals (ZINC PO) Take by mouth.    . Naloxone HCl (NARCAN NA) Place 1 application into the nose once.    [name] oxyCODONE (ROXICODONE) 15 MG immediate release tablet Take 1 tablet (15 mg total) by mouth every 4 (four) hours as needed for pain. 150 tablet 0  . pravastatin (PRAVACHOL) 20 MG  tablet TAKE 1 TABLET BY MOUTH EVERY DAY 90 tablet 1  . predniSONE (DELTASONE) 10 MG tablet Take 2 tablets (20 mg total) by mouth daily with breakfast. 60 tablet 4  . spironolactone (ALDACTONE) 25 MG tablet TAKE 1 TABLET BY MOUTH EVERY DAY 90 tablet 1  . tamsulosin (FLOMAX) 0.4 MG CAPS capsule TAKE 1 CAPSULE BY MOUTH EVERY DAY 90 capsule 1  . cephALEXin (KEFLEX) 500 MG capsule Take 1 capsule (500 mg total) by mouth 3 (three) times daily. 21 capsule 0  . NARCAN 4 MG/0.1ML LIQD nasal spray kit SMARTSIG:1 Spray(s) Both Nares PRN (Patient not taking: Reported on 12/27/2019)    . nystatin ointment (MYCOSTATIN) APPLY  TO AFFECTED AREA TWICE A DAY (Patient not taking: Reported on 12/27/2019) 30 g 3  . oxyCODONE-acetaminophen (PERCOCET) 10-325 MG tablet Take 1 tablet by mouth 4 (four) times daily as needed. (Patient not taking: Reported on 12/27/2019)     No current facility-administered medications for this visit.    Allergies: Doxycycline, Gabapentin, Amlodipine, Fish allergy, Hydrocodone, Other, and Tylenol [acetaminophen]  Past Medical History:  Diagnosis Date  . AAA (abdominal aortic aneurysm) ([place])   . Blindness of left eye   . 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 CAD (coronary artery disease)    a. s/p CABG in 10/2008 with LIMA-LAD, SVG-OM1 with Y-graft to PL branch, and SVG-RCA  . Carotid stenosis   . Cerebrovascular disease   . CHF (congestive heart failure) ([place])   . Cirrhosis ([place])    on CT a/p 07/2013, no longer drinking  . CONGENITAL UNSPEC REDUCTION DEFORMITY LOWER LIMB   . Constipation due to opioid therapy 2014   began about a year ago  . COPD (chronic obstructive pulmonary disease) ([place])   . HTN (hypertension)   . Hyperlipidemia   . 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 Peripheral vascular disease ([place])   . TOBACCO USE, QUIT     Past Surgical History:  Procedure Laterality Date  . CORONARY ARTERY BYPASS GRAFT  11/03/2008   Ricard Dillon - x4: left internal mammary artery to the distal left anterior descending, saphemous vein graft to the first circumflex marginal branch with a Y graft sequentiallly to a left posterolateral branch, saphenous vein graft to the distal right coronary artery  . ELECTROPHYSIOLOGIC STUDY N/A 01/18/2015   Procedure: A-Flutter Ablation;  Surgeon: Deboraha Sprang, MD;  Location: [facility];  Service: Cardiovascular;  Laterality: N/A;  . GREEN LIGHT LASER TURP (TRANSURETHRAL RESECTION OF PROSTATE N/A 02/14/2014   Procedure: GREEN LIGHT LASER TURP (TRANSURETHRAL RESECTION OF PROSTATE;  Surgeon: Festus Aloe, MD;  Location: [facility];  Service: Urology;  Laterality: N/A;  . HIP ARTHROPLASTY Left 01/26/2018   Procedure: LEFT HIP POSTERIOR HEMIARTHROPLASTY;  Surgeon: [name], MD;  Location: [facility];  Service: Orthopedics;  Laterality: Left;  . HIP CLOSED REDUCTION Left 09/18/2018   Procedure: CLOSED MANIPULATION HIP;  Surgeon: Justice Britain, MD;  Location: [facility];  Service: Orthopedics;  Laterality: Left;  . HIP CLOSED REDUCTION Left 10/08/2018   Procedure: CLOSED MANIPULATION HIP;  Surgeon: Rod Can, MD;  Location: [facility];  Service: Orthopedics;  Laterality: Left;  . TOTAL HIP REVISION Left 06/24/2018   Procedure: ACETABULAR HIP REVISION POSTERIOR;  Surgeon: [name], MD;  Location: [facility];  Service: Orthopedics;  Laterality: Left;  . TOTAL HIP REVISION Left 10/21/2018   Procedure: POSTERIOR REVISION HIP WITH POSSIBLE CONSTRAINED LINER;  Surgeon: [name], MD;  Location: [facility];  Service: Orthopedics;  Laterality: Left;    Family History  Problem Relation Age of Onset  . Cirrhosis Mother        died at 24  . Heart attack Father   . Other Brother        GSW  . Other Brother        died in house fire  . Cancer Brother        unsure of type Believes colon or prostate/fim  . Colon cancer Neg Hx   . Stomach cancer Neg Hx     Social History   Tobacco Use  . Smoking status: Former Research scientist (life sciences)  . Smokeless tobacco: Never Used  Substance Use Topics  . Alcohol use: No    Alcohol/week: 0.0 standard drinks    Comment: History of heavy alcohol use per pt. Quit many years ago1/1/ 2005    Subjective:   Patient presents with 2 concerns today:  1) Having increased pain in his left lower leg/ swelling in both of his legs; notes that the swelling is a chronic issue for him and he has been taking the medication his PCP has given in the past with some benefit; notes that when he made the appointment last week his legs "were weeping." He takes 80 mg of Lasix daily and notes he took  prednisone last week that he was originally given in July 2021;  2) Also wants to discuss his request for a power wheelchair; in reviewing medical history, patient has documented chronic back pain and failed hip surgeries that have led to his limited ability to walk on his own; he is on chronic oxycodone to help with the pain; patient is already in a wheelchair and limited support at home makes it difficult for him to be able to move around his home easily; he also has chronic vascular issues including PAD and chronic A. Fib and COPD;   Objective:  Vitals:   12/27/19 1049  BP: 130/70  Pulse: 79  Temp: 98.1 F (36.7 C)  TempSrc: Oral  SpO2: 99%  Height: 5' 10"  (1.778 m)    General: Well developed, well nourished, in no acute distress  Skin : Warm and dry.  Head: Normocephalic and atraumatic  Eyes: Sclera and conjunctiva clear; pupils round and reactive to light; extraocular movements intact  Lungs: Respirations unlabored; clear to auscultation bilaterally without wheeze, rales, rhonchi  Musculoskeletal: No deformities; no active joint inflammation  Extremities: bilateral edema, marked erythema noted in both lower extremities  Neurologic: Alert and oriented; speech intact; face symmetrical; non-weight bearing- in a manual wheelchair  Assessment:  1. Cellulitis of lower extremity, unspecified laterality   2. Gait instability   3. Failed total hip arthroplasty with dislocation, initial encounter ([place])   4. Other chronic back pain   5. Chronic bronchitis, unspecified chronic bronchitis type ([place])   6. PAD (peripheral artery disease) ([place])   7. Chronic atrial fibrillation ([place])     Plan:  1. Rx for Keflex 500 mg tid x 7 days; follow up worse, no better;  2. Order for power wheelchair is given as requested; he will follow-up with the device provider with further questions or concerns;  This visit occurred during the SARS-CoV-2 public health emergency.  Safety protocols were in place,  including screening questions prior to the visit, additional usage of staff PPE, and extensive cleaning of exam room while observing appropriate contact time as indicated for disinfecting solutions.     No follow-ups  on file.  No orders of the defined types were placed in this encounter.   Requested Prescriptions   Signed Prescriptions Disp Refills  . cephALEXin (KEFLEX) 500 MG capsule 21 capsule 0    Sig: Take 1 capsule (500 mg total) by mouth 3 (three) times daily.

## 2019-12-30 DIAGNOSIS — Z96642 Presence of left artificial hip joint: Secondary | ICD-10-CM | POA: Diagnosis not present

## 2019-12-30 DIAGNOSIS — Z79899 Other long term (current) drug therapy: Secondary | ICD-10-CM | POA: Diagnosis not present

## 2019-12-30 DIAGNOSIS — M546 Pain in thoracic spine: Secondary | ICD-10-CM | POA: Diagnosis not present

## 2019-12-30 DIAGNOSIS — M5416 Radiculopathy, lumbar region: Secondary | ICD-10-CM | POA: Diagnosis not present

## 2019-12-30 DIAGNOSIS — M542 Cervicalgia: Secondary | ICD-10-CM | POA: Diagnosis not present

## 2020-01-02 ENCOUNTER — Telehealth: Payer: Self-pay | Admitting: Family

## 2020-01-02 ENCOUNTER — Other Ambulatory Visit: Payer: Self-pay | Admitting: Internal Medicine

## 2020-01-02 NOTE — Telephone Encounter (Signed)
Did it improve at all? Need to know how he responded to the first round of medication.

## 2020-01-02 NOTE — Telephone Encounter (Signed)
Patient was seen on 12/27/19 by Jodi Mourning. He states the Cellulitis of lower extremity is still there and he has finished the medication.   He would like to know what Valere Dross advised next, If he needs to take another round of medication.

## 2020-01-02 NOTE — Telephone Encounter (Signed)
Tried to reach patient but voicemail was full and was unable to leave a message. Will call him in the morning to follow up.

## 2020-01-04 NOTE — Telephone Encounter (Signed)
If patient returns call please let him know I have him scheduled for this Friday at 10:40 am to come back in for follow up per Mickel Baas. If he can't make appointment please send message back so we can extend medication and have him come back in one day next week (Preferably Monday)

## 2020-01-04 NOTE — Telephone Encounter (Signed)
Mailbox was full and I am unable to leave a message to schedule.

## 2020-01-04 NOTE — Telephone Encounter (Signed)
° °  Patient made aware of 9/17 appointment

## 2020-01-04 NOTE — Telephone Encounter (Signed)
Yes, let's do a follow-up please.

## 2020-01-04 NOTE — Telephone Encounter (Signed)
Tried to reach patient again but voicemail was still full and I am not able to leave a message.

## 2020-01-05 ENCOUNTER — Encounter (HOSPITAL_COMMUNITY): Payer: Medicare Other

## 2020-01-05 ENCOUNTER — Inpatient Hospital Stay (HOSPITAL_COMMUNITY): Admission: RE | Admit: 2020-01-05 | Payer: Medicare Other | Source: Ambulatory Visit

## 2020-01-06 ENCOUNTER — Ambulatory Visit: Payer: Medicare Other | Admitting: Family

## 2020-01-10 ENCOUNTER — Telehealth: Payer: Self-pay | Admitting: Cardiology

## 2020-01-10 NOTE — Telephone Encounter (Signed)
   [facility] Pre-operative Risk Assessment    HEARTCARE STAFF: - Please ensure there is not already an duplicate clearance open for this procedure. - Under Visit Info/Reason for Call, type in Other and utilize the format Clearance MM/DD/YY or Clearance TBD. Do not use dashes or single digits. - If request is for dental extraction, please clarify the # of teeth to be extracted.  Request for surgical clearance:  1. What type of surgery is being performed? Total mouth tooth extraction in preparation for dentures  2. When is this surgery scheduled? TBD Based on Dr. Jacalyn Lefevre clearance   3. What type of clearance is required (medical clearance vs. Pharmacy clearance to hold med vs. Both)? Both  4. Are there any medications that need to be held prior to surgery and how long? Eliquis TBD by Cardiology   5. Practice name and name of physician performing surgery? [facility],  Dr. Vale Haven, DDS   6. What is the office phone number? [phone]   7.   What is the office fax number? Pt does not know  8.   Anesthesia type (None, local, MAC, general) ? Pt does not know  Patient says the Dental practice has been trying to get clearance from Dr. Stanford Breed so the patient can start having dental work.    [name] 01/10/2020, 4:45 PM  _________________________________________________________________   (provider comments below)

## 2020-01-10 NOTE — Telephone Encounter (Signed)
   Primary Cardiologist: Kirk Ruths, MD  Chart reviewed as part of pre-operative protocol coverage.    Simple dental extractions are considered low risk procedures per guidelines and generally do not require any specific cardiac clearance. It is also generally accepted that for simple extractions and dental cleanings, there is no need to interrupt blood thinner therapy.  SBE prophylaxis is not required for the patient.  I will route this recommendation to the requesting party via Epic fax function and remove from pre-op pool.  Please call with questions.  Kerin Ransom, PA-C 01/10/2020, 10:16 AM

## 2020-01-10 NOTE — Telephone Encounter (Signed)
1. What dental office are you calling from? [facility]  2. What is your office phone number? [phone]   3. What is your fax number? [phone]  4. What type of procedure is the patient having performed? Two  extractions  5. What date is procedure scheduled or is the patient there now? TBD (if the patient is at the dentist's office question goes to their cardiologist if he/she is in the office.  If not, question should go to the DOD).   6. What is your question (ex. Antibiotics prior to procedure, holding medication-we need to know how long dentist wants pt to hold med)? Office would like to know if the patient can hold eliquis for extractions. Email: [email or web address]

## 2020-01-11 NOTE — Telephone Encounter (Signed)
   Primary Cardiologist: Kirk Ruths, MD  Chart reviewed as part of pre-operative protocol coverage. Given past medical history and time since last visit, based on ACC/AHA guidelines, [name] would be at acceptable risk for the planned procedure without further cardiovascular testing.   The patient was advised that if he develops new symptoms prior to surgery to contact our office to arrange for a follow-up visit, and he verbalized understanding.  If 2 teeth are to be removed we do not recommend holding his Eliquis.    I will route this recommendation to the requesting party via Epic fax function and remove from pre-op pool.  Please call with questions.  Cecilie Kicks, NP 01/11/2020, 12:50 PM

## 2020-01-11 NOTE — Telephone Encounter (Signed)
Pharm just a correction when I talked to pt he tells me it is just 2 teeth not full mouth.  So is there need to hold the Eliquis  thanks.

## 2020-01-11 NOTE — Telephone Encounter (Signed)
Patient with diagnosis of atrial fibrillation on apixaban 5 mg BID for anticoagulation.    Procedure: Total mouth tooth extraction in preparation for dentures Date of procedure: TBD  CHADS2-VASc score of  5 (CHF, HTN, AGE, CAD, AGE)  CrCl 37 mL/min Platelet count 251  Per office protocol, patient can hold apixaban for 1 day prior to procedure.

## 2020-01-11 NOTE — Telephone Encounter (Signed)
No he does not need to hold for 2 teeth extraction

## 2020-01-11 NOTE — Telephone Encounter (Signed)
Pharm please address eliquis thanks 

## 2020-01-13 ENCOUNTER — Ambulatory Visit: Payer: Medicare Other | Admitting: Family

## 2020-01-13 ENCOUNTER — Other Ambulatory Visit: Payer: Self-pay | Admitting: Internal Medicine

## 2020-01-17 ENCOUNTER — Encounter: Payer: Self-pay | Admitting: Family

## 2020-01-17 ENCOUNTER — Ambulatory Visit (INDEPENDENT_AMBULATORY_CARE_PROVIDER_SITE_OTHER): Payer: Medicare Other | Admitting: Family

## 2020-01-17 ENCOUNTER — Other Ambulatory Visit: Payer: Self-pay

## 2020-01-17 VITALS — BP 124/76 | HR 95 | Temp 98.0°F | Ht 70.0 in | Wt 177.0 lb

## 2020-01-17 DIAGNOSIS — J42 Unspecified chronic bronchitis: Secondary | ICD-10-CM

## 2020-01-17 DIAGNOSIS — L03119 Cellulitis of unspecified part of limb: Secondary | ICD-10-CM

## 2020-01-17 MED ORDER — SULFAMETHOXAZOLE-TRIMETHOPRIM 800-160 MG PO TABS: 1.0000 | ORAL_TABLET | Freq: Two times a day (BID) | ORAL | 0 refills | Status: DC

## 2020-01-17 MED ORDER — BUDESONIDE-FORMOTEROL FUMARATE 160-4.5 MCG/ACT IN AERO: 2.0000 | INHALATION_SPRAY | Freq: Two times a day (BID) | RESPIRATORY_TRACT | 3 refills | Status: DC

## 2020-01-17 NOTE — Progress Notes (Signed)
[name] is a 79 y.o. male with the following history as recorded in EpicCare:  Patient Active Problem List   Diagnosis Date Noted  . Bilateral hand pain 11/03/2019  . Anticoagulated 08/19/2019  . PAD (peripheral artery disease) ([place]) 07/27/2019  . Ulcers of both lower legs, limited to breakdown of skin ([place]) 07/21/2019  . Olecranon bursitis of left elbow 07/02/2018  . Failed total hip arthroplasty with dislocation, initial encounter ([place]) 06/22/2018  . Chronic diastolic heart failure ([place]) 03/16/2017  . CKD (chronic kidney disease) stage 3, GFR 30-59 ml/min 03/16/2017  . Diastolic dysfunction 25/63/8937  . Opiate dependence ([place]) 02/13/2017  . Acute renal failure superimposed on stage 3 chronic kidney disease ([place]) 01/21/2017  . Chronic atrial fibrillation ([place]) 11/24/2014  . Aortic stenosis 03/24/2014  . Second degree AV block, Mobitz type I 09/06/2013  . Cirrhosis ([place]) 07/28/2013  . Bladder outlet obstruction 07/28/2013  . Impaired glucose metabolism   . Chronic back pain   . Benign neoplasm of colon 01/08/2011  . Abdominal aortic aneurysm ([place]) 08/12/2010  . TOBACCO USE, QUIT 04/11/2009  . Occlusion and stenosis of carotid artery 01/08/2009  . Hyperlipidemia 11/13/2008  . ANEMIA 11/13/2008  . Essential hypertension 11/13/2008  . Hx of CABG 11/13/2008  . COPD (chronic obstructive pulmonary disease) ([place]) 11/13/2008    Current Outpatient Medications  Medication Sig Dispense Refill  . albuterol (VENTOLIN HFA) 108 (90 Base) MCG/ACT inhaler TAKE 2 PUFFS BY MOUTH EVERY 6 HOURS AS NEEDED FOR WHEEZE OR SHORTNESS OF BREATH 18 each 2  . allopurinol (ZYLOPRIM) 100 MG tablet     . apixaban (ELIQUIS) 5 MG TABS tablet Take 1 tablet (5 mg total) by mouth 2 (two) times daily. 180 tablet 3  . b complex vitamins tablet Take 1 tablet by mouth daily.    . Cholecalciferol (VITAMIN D3 PO) Take by mouth.    . ferrous sulfate 325 (65 FE) MG tablet Take 325 mg by mouth daily with  breakfast.    . finasteride (PROSCAR) 5 MG tablet TAKE 1 TABLET BY MOUTH EVERY DAY 90 tablet 1  . fluticasone (FLONASE) 50 MCG/ACT nasal spray PLACE 1 SPRAY INTO BOTH NOSTRILS EVERY MORNING. 48 mL 1  . furosemide (LASIX) 80 MG tablet TAKE 1 TABLET BY MOUTH EVERY DAY 90 tablet 1  . Guaifenesin 1200 MG TB12 Take 1 tablet (1,200 mg total) by mouth 2 (two) times daily. (Patient taking differently: Take 1,200 mg by mouth 2 (two) times daily as needed (mucus). ) 180 each 3  . KLOR-CON M20 20 MEQ tablet TAKE 1 TABLET BY MOUTH EVERY DAY 90 tablet 1  . lactulose (CHRONULAC) 10 GM/15ML solution TAKE 30 MLS BY MOUTH 2 TIMES DAILY AS NEEDED FOR MILD CONSTIPATION OR MODERATE CONSTIPATION (Patient taking differently: Take 20 g by mouth 2 (two) times daily as needed for mild constipation. ) 1892 mL 6  . lidocaine (XYLOCAINE) 5 % ointment Apply 1 application topically as needed. 240 g 6  . Multiple Vitamins-Minerals (ZINC PO) Take by mouth.    . Naloxone HCl (NARCAN NA) Place 1 application into the nose once.    [name] oxyCODONE (ROXICODONE) 15 MG immediate release tablet Take 1 tablet (15 mg total) by mouth every 4 (four) hours as needed for pain. 150 tablet 0  . pravastatin (PRAVACHOL) 20 MG tablet TAKE 1 TABLET BY MOUTH EVERY DAY 90 tablet 1  . predniSONE (DELTASONE) 10 MG tablet Take 2 tablets (20 mg total) by mouth daily with breakfast. 60  tablet 4  . spironolactone (ALDACTONE) 25 MG tablet TAKE 1 TABLET BY MOUTH EVERY DAY 90 tablet 1  . tamsulosin (FLOMAX) 0.4 MG CAPS capsule TAKE 1 CAPSULE BY MOUTH EVERY DAY 90 capsule 1  . budesonide-formoterol (SYMBICORT) 160-4.5 MCG/ACT inhaler Inhale 2 puffs into the lungs 2 (two) times daily. 1 each 3  . NARCAN 4 MG/0.1ML LIQD nasal spray kit SMARTSIG:1 Spray(s) Both Nares PRN (Patient not taking: Reported on 12/27/2019)    . nystatin ointment (MYCOSTATIN) APPLY TO AFFECTED AREA TWICE A DAY (Patient not taking: Reported on 12/27/2019) 30 g 3  . oxyCODONE-acetaminophen  (PERCOCET) 10-325 MG tablet Take 1 tablet by mouth 4 (four) times daily as needed. (Patient not taking: Reported on 12/27/2019)    . sulfamethoxazole-trimethoprim (BACTRIM DS) 800-160 MG tablet Take 1 tablet by mouth 2 (two) times daily. 20 tablet 0   No current facility-administered medications for this visit.    Allergies: Doxycycline, Gabapentin, Amlodipine, Fish allergy, Hydrocodone, Other, and Tylenol [acetaminophen]  Past Medical History:  Diagnosis Date  . AAA (abdominal aortic aneurysm) ([place])   . Blindness of left eye   . 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 CAD (coronary artery disease)    a. s/p CABG in 10/2008 with LIMA-LAD, SVG-OM1 with Y-graft to PL branch, and SVG-RCA  . Carotid stenosis   . Cerebrovascular disease   . CHF (congestive heart failure) ([place])   . Cirrhosis ([place])    on CT a/p 07/2013, no longer drinking  . CONGENITAL UNSPEC REDUCTION DEFORMITY LOWER LIMB   . Constipation due to opioid therapy 2014   began about a year ago  . COPD (chronic obstructive pulmonary disease) ([place])   . HTN (hypertension)   . Hyperlipidemia   . 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 Peripheral vascular disease ([place])   . TOBACCO USE, QUIT     Past Surgical History:  Procedure Laterality Date  . CORONARY ARTERY BYPASS GRAFT  11/03/2008   Ricard Dillon - x4: left internal mammary artery to the distal left anterior descending, saphemous vein graft to the first circumflex marginal branch with a Y graft sequentiallly to a left posterolateral branch, saphenous vein graft to the distal right coronary artery  . ELECTROPHYSIOLOGIC STUDY N/A 01/18/2015   Procedure: A-Flutter Ablation;  Surgeon: Deboraha Sprang, MD;  Location: [facility];  Service: Cardiovascular;  Laterality: N/A;  . GREEN LIGHT LASER TURP (TRANSURETHRAL RESECTION OF PROSTATE N/A 02/14/2014   Procedure: GREEN LIGHT LASER TURP (TRANSURETHRAL RESECTION OF PROSTATE;  Surgeon:  Festus Aloe, MD;  Location: [facility];  Service: Urology;  Laterality: N/A;  . HIP ARTHROPLASTY Left 01/26/2018   Procedure: LEFT HIP POSTERIOR HEMIARTHROPLASTY;  Surgeon: [name], MD;  Location: [facility];  Service: Orthopedics;  Laterality: Left;  . HIP CLOSED REDUCTION Left 09/18/2018   Procedure: CLOSED MANIPULATION HIP;  Surgeon: Justice Britain, MD;  [facility]: [facility];  Service: Orthopedics;  Laterality: Left;  . HIP CLOSED REDUCTION Left 10/08/2018   Procedure: CLOSED MANIPULATION HIP;  Surgeon: Rod Can, MD;  [facility];  Service: Orthopedics;  Laterality: Left;  . TOTAL HIP REVISION Left 06/24/2018   Procedure: ACETABULAR HIP REVISION POSTERIOR;  Surgeon: [name], MD;  [facility]: [facility];  Service: Orthopedics;  Laterality: Left;  . TOTAL HIP REVISION Left 10/21/2018   Procedure: POSTERIOR REVISION HIP WITH POSSIBLE CONSTRAINED LINER;  Surgeon: [name], MD;  [facility];  Service: Orthopedics;  Laterality: Left;  Family History  Problem Relation Age of Onset  . Cirrhosis Mother        died at 8  . Heart attack Father   . Other Brother        GSW  . Other Brother        died in house fire  . Cancer Brother        unsure of type Believes colon or prostate/fim  . Colon cancer Neg Hx   . Stomach cancer Neg Hx     Social History   Tobacco Use  . Smoking status: Former Research scientist (life sciences)  . Smokeless tobacco: Never Used  Substance Use Topics  . Alcohol use: No    Alcohol/week: 0.0 standard drinks    Comment: History of heavy alcohol use per pt. Quit many years ago1/1/ 2005    Subjective:  Follow up on cellulitis; was seen on 9/7 and treated with keflex; asked to return to office for re-check but could not get here before now; symptoms are better but still present;     Objective:  Vitals:   01/17/20 1239  BP: 124/76  Pulse: 95  Temp: 98 F (36.7 C)  TempSrc: Oral  SpO2: 96%  Weight: 177 lb (80.3 kg)  Height: 5' 10"  (1.778 m)    General: Well  developed, well nourished, in no acute distress  Skin : Warm and dry.  Head: Normocephalic and atraumatic  Eyes: Sclera and conjunctiva clear; pupils round and reactive to light; extraocular movements intact  Ears: External normal; canals clear; tympanic membranes normal  Oropharynx: Pink, supple. No suspicious lesions  Neck: Supple without thyromegaly, adenopathy  Lungs: Respirations unlabored; clear to auscultation bilaterally without wheeze, rales, rhonchi  Neurologic: Alert and oriented; speech intact; face symmetrical;   Assessment:  1. Cellulitis of lower extremity, unspecified laterality   2. Chronic bronchitis, unspecified chronic bronchitis type ([place])     Plan:  1. Change to Bactrim DS bid x 10 days; follow-up as needed; 2. Trial of Symbicort 160 2 puffs bid; patient to call back with his response in 2 weeks;  This visit occurred during the SARS-CoV-2 public health emergency.  Safety protocols were in place, including screening questions prior to the visit, additional usage of staff PPE, and extensive cleaning of exam room while observing appropriate contact time as indicated for disinfecting solutions.     No follow-ups on file.  No orders of the defined types were placed in this encounter.   Requested Prescriptions   Signed Prescriptions Disp Refills  . sulfamethoxazole-trimethoprim (BACTRIM DS) 800-160 MG tablet 20 tablet 0    Sig: Take 1 tablet by mouth 2 (two) times daily.  . budesonide-formoterol (SYMBICORT) 160-4.5 MCG/ACT inhaler 1 each 3    Sig: Inhale 2 puffs into the lungs 2 (two) times daily.

## 2020-01-24 ENCOUNTER — Ambulatory Visit (HOSPITAL_COMMUNITY)
Admission: RE | Admit: 2020-01-24 | Discharge: 2020-01-24 | Disposition: A | Discharge status: Home / Self Care | Payer: Medicare Other | Source: Ambulatory Visit | Attending: Cardiovascular Disease | Admitting: Cardiovascular Disease

## 2020-01-24 ENCOUNTER — Other Ambulatory Visit: Payer: Self-pay

## 2020-01-24 ENCOUNTER — Other Ambulatory Visit (HOSPITAL_COMMUNITY): Payer: Self-pay | Admitting: Cardiology

## 2020-01-24 ENCOUNTER — Ambulatory Visit (HOSPITAL_BASED_OUTPATIENT_CLINIC_OR_DEPARTMENT_OTHER)
Admission: RE | Admit: 2020-01-24 | Discharge: 2020-01-24 | Disposition: A | Discharge status: Home / Self Care | Payer: Medicare Other | Source: Ambulatory Visit | Attending: Cardiovascular Disease | Admitting: Cardiovascular Disease

## 2020-01-24 DIAGNOSIS — I6523 Occlusion and stenosis of bilateral carotid arteries: Secondary | ICD-10-CM

## 2020-01-24 DIAGNOSIS — I714 Abdominal aortic aneurysm, without rupture, unspecified: Secondary | ICD-10-CM

## 2020-01-25 ENCOUNTER — Encounter: Payer: Self-pay | Admitting: Family

## 2020-01-27 ENCOUNTER — Encounter: Payer: Self-pay | Admitting: *Deleted

## 2020-01-27 ENCOUNTER — Other Ambulatory Visit: Payer: Self-pay | Admitting: *Deleted

## 2020-01-27 ENCOUNTER — Telehealth: Payer: Self-pay | Admitting: Internal Medicine

## 2020-01-27 DIAGNOSIS — I714 Abdominal aortic aneurysm, without rupture, unspecified: Secondary | ICD-10-CM

## 2020-01-27 DIAGNOSIS — Z79899 Other long term (current) drug therapy: Secondary | ICD-10-CM | POA: Diagnosis not present

## 2020-01-27 DIAGNOSIS — M546 Pain in thoracic spine: Secondary | ICD-10-CM | POA: Diagnosis not present

## 2020-01-27 DIAGNOSIS — M5416 Radiculopathy, lumbar region: Secondary | ICD-10-CM | POA: Diagnosis not present

## 2020-01-27 DIAGNOSIS — M542 Cervicalgia: Secondary | ICD-10-CM | POA: Diagnosis not present

## 2020-01-27 DIAGNOSIS — M62830 Muscle spasm of back: Secondary | ICD-10-CM | POA: Diagnosis not present

## 2020-01-27 DIAGNOSIS — I739 Peripheral vascular disease, unspecified: Secondary | ICD-10-CM

## 2020-01-27 MED ORDER — SULFAMETHOXAZOLE-TRIMETHOPRIM 800-160 MG PO TABS: 1.0000 | ORAL_TABLET | Freq: Two times a day (BID) | ORAL | 0 refills | Status: DC

## 2020-01-27 NOTE — Addendum Note (Signed)
Addended by: Sherlene Shams on: 01/27/2020 12:35 PM   Modules accepted: Orders

## 2020-01-27 NOTE — Telephone Encounter (Signed)
Unable to reach pt or leave a message mailbox is full 

## 2020-01-27 NOTE — Telephone Encounter (Signed)
  Patient requesting refill for sulfamethoxazole-trimethoprim (BACTRIM DS) 800-160 MG tablet for redness and bumps on legs that have continued since last ov. Pharmacy:  [id number] - [place], [place].

## 2020-01-27 NOTE — Telephone Encounter (Signed)
-----   Message from Lelon Perla, MD sent at 01/24/2020 10:39 AM EDT ----- Schedule abdominal/pelvic CT without contrast (due to renal insufficiency) to better assess AAA and iliac arteries. Kirk Ruths

## 2020-01-27 NOTE — Telephone Encounter (Signed)
Called pt, LVM.   

## 2020-01-27 NOTE — Telephone Encounter (Signed)
I will extend it for 4 more days; unfortunately, with the chronic swelling in his legs, the drainage and "bumps" are not uncommon. Please ask him to make sure he keeps his upcoming cardiology appointment.

## 2020-01-27 NOTE — Progress Notes (Signed)
ct 

## 2020-02-01 NOTE — Telephone Encounter (Signed)
This encounter was created in error - please disregard.

## 2020-02-02 ENCOUNTER — Telehealth: Payer: Self-pay | Admitting: Cardiology

## 2020-02-02 NOTE — Telephone Encounter (Signed)
ew Message:    Pt said he had a nosebleed yesterday that lasted 35 to 45 minutes.  Today he is supposed to have 2 teeth extracted. He wants to know should he still get his teeth extracted today? Pt is very concerned, because he is on Eliquis. He needs to know asap, in case he needs to cx his dental appt.w l

## 2020-02-02 NOTE — Telephone Encounter (Signed)
Spoke with pt, aware he can use saline nasal spray to keep the nose moist to try to prevent future nose bleeds. Advised the patient to call his dentist because it is really up to them if he can have the procedure or not today. This is the first nose bleed he has had in over 1 year. He will call the dentist office.

## 2020-02-06 ENCOUNTER — Telehealth: Payer: Self-pay | Admitting: Internal Medicine

## 2020-02-06 NOTE — Telephone Encounter (Signed)
    Sandy from [facility] calling , states she is re-faxing a utilization form to office for completion. Please call [phone] ext [phone]

## 2020-02-06 NOTE — Telephone Encounter (Signed)
Noted. Will keep an eye out for it.

## 2020-02-07 NOTE — Progress Notes (Signed)
HPI: FU CAD; s/p NSTEMI followed by CABG 10/2008 (L-LAD, S-OM1/L PL br, S-RCA), AAA, carotid stenosis. Beta blocker previously held due to Mobitz Type 1. Patient had atrial flutter ablation in September 2016. Dr. Caryl Comes ordered a Holter monitor that showed significant pauses and heart block but patient was asleep and conservative management felt indicated.  Patient recently had hip dislocation.  Prior to surgery he was found to have slow atrial fibrillation.  He was ultimately placed on anticoagulation. Since last seen, patient denies dyspnea, chest pain, palpitations or syncope.  He has chronic mild pedal edema.  Studies: - LHC (09/2008): 3 v CAD, EF 65% =>CABG  - Echo (3/20): Normal LV function, mild left ventricular hypertrophy, mild right ventricular enlargement, severe biatrial enlargement, mild to moderate mitral regurgitation. - Carotid US (22/6/33): [id number]%; LICA1-39%  - Abdominal Ultrasound10/5/21showed dilatation of 3.2cm. Follow-up recommended 1 year -ABIs April 2021 moderate on the right and left.  Current Outpatient Medications  Medication Sig Dispense Refill  . albuterol (VENTOLIN HFA) 108 (90 Base) MCG/ACT inhaler TAKE 2 PUFFS BY MOUTH EVERY 6 HOURS AS NEEDED FOR WHEEZE OR SHORTNESS OF BREATH 18 each 2  . allopurinol (ZYLOPRIM) 100 MG tablet     . apixaban (ELIQUIS) 5 MG TABS tablet Take 1 tablet (5 mg total) by mouth 2 (two) times daily. 180 tablet 3  . b complex vitamins tablet Take 1 tablet by mouth daily.    . budesonide-formoterol (SYMBICORT) 160-4.5 MCG/ACT inhaler Inhale 2 puffs into the lungs 2 (two) times daily. 1 each 3  . Cholecalciferol (VITAMIN D3 PO) Take by mouth.    . ferrous sulfate 325 (65 FE) MG tablet Take 325 mg by mouth daily with breakfast.    . finasteride (PROSCAR) 5 MG tablet TAKE 1 TABLET BY MOUTH EVERY DAY 90 tablet 1  . fluticasone (FLONASE) 50 MCG/ACT nasal spray PLACE 1 SPRAY INTO BOTH NOSTRILS EVERY MORNING. 48 mL 1  . furosemide  (LASIX) 80 MG tablet TAKE 1 TABLET BY MOUTH EVERY DAY 90 tablet 1  . Guaifenesin 1200 MG TB12 Take 1 tablet (1,200 mg total) by mouth 2 (two) times daily. (Patient taking differently: Take 1,200 mg by mouth 2 (two) times daily as needed (mucus). ) 180 each 3  . KLOR-CON M20 20 MEQ tablet TAKE 1 TABLET BY MOUTH EVERY DAY 90 tablet 1  . lactulose (CHRONULAC) 10 GM/15ML solution TAKE 30 MLS BY MOUTH 2 TIMES DAILY AS NEEDED FOR MILD CONSTIPATION OR MODERATE CONSTIPATION (Patient taking differently: Take 20 g by mouth 2 (two) times daily as needed for mild constipation. ) 1892 mL 6  . lidocaine (XYLOCAINE) 5 % ointment Apply 1 application topically as needed. 240 g 6  . Multiple Vitamins-Minerals (ZINC PO) Take by mouth.    . Naloxone HCl (NARCAN NA) Place 1 application into the nose once.    [name] NARCAN 4 MG/0.1ML LIQD nasal spray kit SMARTSIG:1 Spray(s) Both Nares PRN    . nystatin ointment (MYCOSTATIN) APPLY TO AFFECTED AREA TWICE A DAY 30 g 3  . oxyCODONE (ROXICODONE) 15 MG immediate release tablet Take 1 tablet (15 mg total) by mouth every 4 (four) hours as needed for pain. 150 tablet 0  . pravastatin (PRAVACHOL) 20 MG tablet TAKE 1 TABLET BY MOUTH EVERY DAY 90 tablet 1  . predniSONE (DELTASONE) 10 MG tablet Take 2 tablets (20 mg total) by mouth daily with breakfast. 60 tablet 4  . spironolactone (ALDACTONE) 25 MG tablet TAKE 1 TABLET BY  MOUTH EVERY DAY 90 tablet 1  . sulfamethoxazole-trimethoprim (BACTRIM DS) 800-160 MG tablet Take 1 tablet by mouth 2 (two) times daily. 8 tablet 0  . tamsulosin (FLOMAX) 0.4 MG CAPS capsule TAKE 1 CAPSULE BY MOUTH EVERY DAY 90 capsule 1   No current facility-administered medications for this visit.     Past Medical History:  Diagnosis Date  . AAA (abdominal aortic aneurysm) ([place])   . Blindness of left eye   . 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 CAD (coronary artery disease)    a. s/p CABG in 10/2008 with LIMA-LAD, SVG-OM1 with  Y-graft to PL branch, and SVG-RCA  . Carotid stenosis   . Cerebrovascular disease   . CHF (congestive heart failure) ([place])   . Cirrhosis ([place])    on CT a/p 07/2013, no longer drinking  . CONGENITAL UNSPEC REDUCTION DEFORMITY LOWER LIMB   . Constipation due to opioid therapy 2014   began about a year ago  . COPD (chronic obstructive pulmonary disease) ([place])   . HTN (hypertension)   . Hyperlipidemia   . 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 Peripheral vascular disease ([place])   . TOBACCO USE, QUIT     Past Surgical History:  Procedure Laterality Date  . CORONARY ARTERY BYPASS GRAFT  11/03/2008   Ricard Dillon - x4: left internal mammary artery to the distal left anterior descending, saphemous vein graft to the first circumflex marginal branch with a Y graft sequentiallly to a left posterolateral branch, saphenous vein graft to the distal right coronary artery  . ELECTROPHYSIOLOGIC STUDY N/A 01/18/2015   Procedure: A-Flutter Ablation;  Surgeon: Deboraha Sprang, MD;  Location: [facility];  Service: Cardiovascular;  Laterality: N/A;  . GREEN LIGHT LASER TURP (TRANSURETHRAL RESECTION OF PROSTATE N/A 02/14/2014   Procedure: GREEN LIGHT LASER TURP (TRANSURETHRAL RESECTION OF PROSTATE;  Surgeon: Festus Aloe, MD;  [facility];  Service: Urology;  Laterality: N/A;  . HIP ARTHROPLASTY Left 01/26/2018   Procedure: LEFT HIP POSTERIOR HEMIARTHROPLASTY;  Surgeon: [name], MD;  [facility];  Service: Orthopedics;  Laterality: Left;  . HIP CLOSED REDUCTION Left 09/18/2018   Procedure: CLOSED MANIPULATION HIP;  Surgeon: Justice Britain, MD;  [facility];  Service: Orthopedics;  Laterality: Left;  . HIP CLOSED REDUCTION Left 10/08/2018   Procedure: CLOSED MANIPULATION HIP;  Surgeon: Rod Can, MD;  [facility];  Service: Orthopedics;  Laterality: Left;  . TOTAL HIP REVISION Left 06/24/2018   Procedure: ACETABULAR HIP REVISION POSTERIOR;  Surgeon: [name], MD;  [facility];  Service: Orthopedics;  Laterality: Left;  . TOTAL HIP REVISION Left 10/21/2018   Procedure: POSTERIOR REVISION HIP WITH POSSIBLE CONSTRAINED LINER;  Surgeon: [name], MD;  [facility];  Service: Orthopedics;  Laterality: Left;    Social History   Socioeconomic History  . Marital status: Widowed    Spouse name: Not on file  . Number of children: 2  . Years of education: 4  . Highest education level: High school graduate  Occupational History  . Occupation: Retired  Tobacco Use  . Smoking status: Former Research scientist (life sciences)  . Smokeless tobacco: Never Used  Vaping Use  . Vaping Use: Never used  Substance and Sexual Activity  . Alcohol use: No    Alcohol/week: 0.0 standard drinks    Comment: History of heavy alcohol use per pt. Quit many years ago1/1/ 2005  . Drug use: No  . Sexual activity: Not  Currently  Other Topics Concern  . Not on file  Social History Narrative   He lives alone here in [place].  He has 1 son, who lives in the [place],    Social Determinants of Health   Financial Resource Strain: [place]   . Difficulty of Paying Living Expenses: Not hard at all  Food Insecurity: No Food Insecurity  . Worried About Charity fundraiser in the Last Year: Never true  . Ran Out of Food in the Last Year: Never true  Transportation Needs: No Transportation Needs  . Lack of Transportation (Medical): No  . Lack of Transportation (Non-Medical): No  Physical Activity: Inactive  . Days of Exercise per Week: 0 days  . Minutes of Exercise per Session: 0 min  Stress: No Stress Concern Present  . Feeling of Stress : Only a little  Social Connections: Moderately Integrated  . Frequency of Communication with Friends and Family: More than three times a week  . Frequency of Social Gatherings with Friends and Family: More than three times a week  . Attends Religious Services: More than 4 times per year  . Active Member of Clubs or Organizations:  Yes  . Attends Archivist Meetings: More than 4 times per year  . Marital Status: Widowed  Intimate Partner Violence: Not At Risk  . Fear of Current or Ex-Partner: No  . Emotionally Abused: No  . Physically Abused: No  . Sexually Abused: No    Family History  Problem Relation Age of Onset  . Cirrhosis Mother        died at 25  . Heart attack Father   . Other Brother        GSW  . Other Brother        died in house fire  . Cancer Brother        unsure of type Believes colon or prostate/fim  . Colon cancer Neg Hx   . Stomach cancer Neg Hx     ROS: no fevers or chills, productive cough, hemoptysis, dysphasia, odynophagia, melena, hematochezia, dysuria, hematuria, rash, seizure activity, orthopnea, PND, claudication. Remaining systems are negative.  Physical Exam: Well-developed well-nourished in no acute distress.  Skin is warm and dry.  HEENT is normal.  Neck is supple.  Chest is clear to auscultation with normal expansion.  Cardiovascular exam is irregular Abdominal exam nontender or distended. No masses palpated. Extremities show 1+ edema, mild erythema. neuro grossly intact  ECG-atrial fibrillation with PVCs or aberrantly conducted beats, no ST changes.  Personally reviewed  A/P  1 coronary artery disease-patient denies recurrent chest pain.  Continue statin.  He is not on aspirin given need for apixaban.  2 abdominal aortic aneurysm-plan follow-up ultrasound October 2022.  3 aortic stenosis-not evident on most recent echocardiogram.  He will need follow-up studies in the future.  4 carotid artery disease-plan follow-up carotid Dopplers October 2022.  5 hypertension-blood pressure controlled.  Continue present medical regimen.  6 hyperlipidemia-continue statin.  Check lipids and liver.  7 history of Mobitz 1 second-degree AV block-patient will remain off of AV nodal blocking agents.  He denies syncope.  8 history of atrial flutter ablation  9  chronic diastolic congestive heart failure-he remains euvolemic today.  Continue Lasix at present dose.  Check potassium and renal function.  10 permanent atrial fibrillation-patient has been asymptomatic and therefore rate control and anticoagulation recommended.  Continue apixaban.  Check hemoglobin and renal function.  Rate is controlled on no  medications.  Kirk Ruths, MD

## 2020-02-13 ENCOUNTER — Ambulatory Visit (INDEPENDENT_AMBULATORY_CARE_PROVIDER_SITE_OTHER): Payer: Medicare Other | Admitting: Cardiology

## 2020-02-13 ENCOUNTER — Encounter: Payer: Self-pay | Admitting: Cardiology

## 2020-02-13 ENCOUNTER — Other Ambulatory Visit: Payer: Self-pay

## 2020-02-13 ENCOUNTER — Other Ambulatory Visit: Payer: Medicare Other

## 2020-02-13 VITALS — BP 109/58 | HR 70 | Ht 70.0 in | Wt 177.0 lb

## 2020-02-13 DIAGNOSIS — I10 Essential (primary) hypertension: Secondary | ICD-10-CM

## 2020-02-13 DIAGNOSIS — E7800 Pure hypercholesterolemia, unspecified: Secondary | ICD-10-CM | POA: Diagnosis not present

## 2020-02-13 DIAGNOSIS — I2510 Atherosclerotic heart disease of native coronary artery without angina pectoris: Secondary | ICD-10-CM | POA: Diagnosis not present

## 2020-02-13 DIAGNOSIS — I482 Chronic atrial fibrillation, unspecified: Secondary | ICD-10-CM | POA: Diagnosis not present

## 2020-02-13 NOTE — Patient Instructions (Signed)
  Lab Work:  Your physician recommends that you HAVE LAB WORK TODAY  If you have labs (blood work) drawn today and your tests are completely normal, you will receive your results only by: MyChart Message (if you have MyChart) OR A paper copy in the mail If you have any lab test that is abnormal or we need to change your treatment, we will call you to review the results.   Follow-Up: At [facility], you and your health needs are our priority.  As part of our continuing mission to provide you with exceptional heart care, we have created designated Provider Care Teams.  These Care Teams include your primary Cardiologist (physician) and Advanced Practice Providers (APPs -  Physician Assistants and Nurse Practitioners) who all work together to provide you with the care you need, when you need it.  We recommend signing up for the patient portal called "MyChart".  Sign up information is provided on this After Visit Summary.  MyChart is used to connect with patients for Virtual Visits (Telemedicine).  Patients are able to view lab/test results, encounter notes, upcoming appointments, etc.  Non-urgent messages can be sent to your provider as well.   To learn more about what you can do with MyChart, go to [email or web address].    Your next appointment:   12 month(s)  The format for your next appointment:   In Person  Provider:   Brian Crenshaw, MD   

## 2020-02-14 LAB — COMPREHENSIVE METABOLIC PANEL
ALT: 28 IU/L (ref 0–44)
AST: 24 IU/L (ref 0–40)
Albumin/Globulin Ratio: 1.4 (ref 1.2–2.2)
Albumin: 3.9 g/dL (ref 3.7–4.7)
Alkaline Phosphatase: 80 IU/L (ref 44–121)
BUN/Creatinine Ratio: 29 — ABNORMAL HIGH (ref 10–24)
BUN: 51 mg/dL — ABNORMAL HIGH (ref 8–27)
Bilirubin Total: 0.6 mg/dL (ref 0.0–1.2)
CO2: 24 mmol/L (ref 20–29)
Calcium: 9.1 mg/dL (ref 8.6–10.2)
Chloride: 99 mmol/L (ref 96–106)
Creatinine, Ser: 1.75 mg/dL — ABNORMAL HIGH (ref 0.76–1.27)
GFR calc Af Amer: 42 mL/min/{1.73_m2} — ABNORMAL LOW (ref >59)
GFR calc non Af Amer: 37 mL/min/{1.73_m2} — ABNORMAL LOW (ref >59)
Globulin, Total: 2.7 g/dL (ref 1.5–4.5)
Glucose: 203 mg/dL — ABNORMAL HIGH (ref 65–99)
Potassium: 4.5 mmol/L (ref 3.5–5.2)
Sodium: 140 mmol/L (ref 134–144)
Total Protein: 6.6 g/dL (ref 6.0–8.5)

## 2020-02-14 LAB — CBC
Hematocrit: 37.4 % — ABNORMAL LOW (ref 37.5–51.0)
Hemoglobin: 12.4 g/dL — ABNORMAL LOW (ref 13.0–17.7)
MCH: 28.2 pg (ref 26.6–33.0)
MCHC: 33.2 g/dL (ref 31.5–35.7)
MCV: 85 fL (ref 79–97)
Platelets: 222 10*3/uL (ref 150–450)
RBC: 4.4 x10E6/uL (ref 4.14–5.80)
RDW: 17.6 % — ABNORMAL HIGH (ref 11.6–15.4)
WBC: 7.8 10*3/uL (ref 3.4–10.8)

## 2020-02-14 LAB — LIPID PANEL
Chol/HDL Ratio: 3.6 ratio (ref 0.0–5.0)
Cholesterol, Total: 185 mg/dL (ref 100–199)
HDL: 52 mg/dL (ref >39)
LDL Chol Calc (NIH): 85 mg/dL (ref 0–99)
Triglycerides: 295 mg/dL — ABNORMAL HIGH (ref 0–149)
VLDL Cholesterol Cal: 48 mg/dL — ABNORMAL HIGH (ref 5–40)

## 2020-02-15 ENCOUNTER — Telehealth: Payer: Self-pay | Admitting: *Deleted

## 2020-02-15 DIAGNOSIS — E7800 Pure hypercholesterolemia, unspecified: Secondary | ICD-10-CM

## 2020-02-15 MED ORDER — ROSUVASTATIN CALCIUM 40 MG PO TABS: 40.0000 mg | ORAL_TABLET | Freq: Every day | ORAL | 3 refills | Status: DC

## 2020-02-15 NOTE — Telephone Encounter (Signed)
Spoke with pt, Aware of dr crenshaw's recommendations. New script sent to the pharmacy and Lab orders mailed to the pt  

## 2020-02-15 NOTE — Telephone Encounter (Addendum)
-----   Message from Lelon Perla, MD sent at 02/14/2020  7:07 AM EDT ----- DC pravastatin; crestor 40 mg daily; lipids and liver 12 weeks Kirk Ruths  Unable to reach pt or leave a message

## 2020-02-15 NOTE — Telephone Encounter (Signed)
Patient returning call.

## 2020-02-20 ENCOUNTER — Telehealth: Payer: Self-pay | Admitting: Internal Medicine

## 2020-02-20 MED ORDER — SULFAMETHOXAZOLE-TRIMETHOPRIM 800-160 MG PO TABS: 1.0000 | ORAL_TABLET | Freq: Two times a day (BID) | ORAL | 0 refills | Status: DC

## 2020-02-20 NOTE — Telephone Encounter (Signed)
I am going to send in the antibiotic; he should come see Dr. Sharlet Salina for a re-check/ follow up in 2-3 weeks; her first available appointment is fine.

## 2020-02-20 NOTE — Telephone Encounter (Signed)
Patient states his cellulitis has come back and was wondering if he could get another antibiotic for it  Seen by Mickel Baas on 09.28.21 sulfamethoxazole-trimethoprim (BACTRIM DS) 800-160 MG tablet [id number] - [place], [place] - [address] Phone:  [phone]  Fax:  [phone]

## 2020-02-20 NOTE — Telephone Encounter (Signed)
Patient notified

## 2020-02-21 ENCOUNTER — Inpatient Hospital Stay: Admission: RE | Admit: 2020-02-21 | Payer: Medicare Other | Source: Ambulatory Visit

## 2020-02-22 ENCOUNTER — Other Ambulatory Visit: Payer: Self-pay | Admitting: Internal Medicine

## 2020-02-23 ENCOUNTER — Other Ambulatory Visit: Payer: Self-pay | Admitting: Internal Medicine

## 2020-02-23 ENCOUNTER — Other Ambulatory Visit: Payer: Medicare Other

## 2020-02-24 DIAGNOSIS — M546 Pain in thoracic spine: Secondary | ICD-10-CM | POA: Diagnosis not present

## 2020-02-24 DIAGNOSIS — M542 Cervicalgia: Secondary | ICD-10-CM | POA: Diagnosis not present

## 2020-02-24 DIAGNOSIS — Z96642 Presence of left artificial hip joint: Secondary | ICD-10-CM | POA: Diagnosis not present

## 2020-02-24 DIAGNOSIS — M5416 Radiculopathy, lumbar region: Secondary | ICD-10-CM | POA: Diagnosis not present

## 2020-02-24 DIAGNOSIS — Z79899 Other long term (current) drug therapy: Secondary | ICD-10-CM | POA: Diagnosis not present

## 2020-02-29 ENCOUNTER — Telehealth: Payer: Self-pay | Admitting: Internal Medicine

## 2020-02-29 NOTE — Telephone Encounter (Signed)
Patient is requesting a med refill for sulfamethoxazole-trimethoprim (BACTRIM DS) 800-160 MG tablet  It can be sent to [id number] - [place], [place].    Please call the patient if rx is sent

## 2020-03-01 MED ORDER — SULFAMETHOXAZOLE-TRIMETHOPRIM 800-160 MG PO TABS
1.0000 | ORAL_TABLET | Freq: Two times a day (BID) | ORAL | 0 refills | Status: DC
Start: 2020-03-01 — End: 2020-03-08

## 2020-03-01 NOTE — Telephone Encounter (Signed)
Sent in refill, can we ask patient if his cellulitis is improving. If not he will likely need different antibiotic and should not fill the bactrim.

## 2020-03-01 NOTE — Telephone Encounter (Signed)
Okay to fill bactrim then.

## 2020-03-05 NOTE — Telephone Encounter (Signed)
Called pt unable to leave VM. Will try again at a later time.

## 2020-03-05 NOTE — Telephone Encounter (Signed)
Okay to start the bactrim.

## 2020-03-07 NOTE — Telephone Encounter (Signed)
Spoke with pt regarding bactrim and he expressed understanding. MH, RMA

## 2020-03-08 ENCOUNTER — Ambulatory Visit (INDEPENDENT_AMBULATORY_CARE_PROVIDER_SITE_OTHER): Payer: Medicare Other | Admitting: Internal Medicine

## 2020-03-08 ENCOUNTER — Encounter: Payer: Self-pay | Admitting: Internal Medicine

## 2020-03-08 ENCOUNTER — Other Ambulatory Visit: Payer: Self-pay

## 2020-03-08 DIAGNOSIS — L97211 Non-pressure chronic ulcer of right calf limited to breakdown of skin: Secondary | ICD-10-CM | POA: Diagnosis not present

## 2020-03-08 MED ORDER — SULFAMETHOXAZOLE-TRIMETHOPRIM 800-160 MG PO TABS: 1.0000 | ORAL_TABLET | Freq: Two times a day (BID) | ORAL | 0 refills | Status: DC

## 2020-03-08 NOTE — Progress Notes (Signed)
   Subjective:   Patient ID: GLENDA KUNST, male    DOB: 1941-02-08, 79 y.o.   MRN: [id number]  HPI The patient is a 79 YO man coming in for redness bilaterally on his legs. He has had swelling intermittent and several bouts of cellulitis within the last year. Denies fevers or chills. Redness did recede some after course of 1 week antibiotics in October. Overall mobility is poor.   Review of Systems  Constitutional: Positive for activity change.  HENT: Negative.   Eyes: Negative.   Respiratory: Negative for cough, chest tightness and shortness of breath.   Cardiovascular: Positive for leg swelling. Negative for chest pain and palpitations.  Gastrointestinal: Negative for abdominal distention, abdominal pain, constipation, diarrhea, nausea and vomiting.  Musculoskeletal: Positive for arthralgias, back pain and gait problem.  Skin: Positive for color change.  Psychiatric/Behavioral: Negative.     Objective:  Physical Exam Constitutional:      Appearance: He is well-developed.  HENT:     Head: Normocephalic and atraumatic.  Cardiovascular:     Rate and Rhythm: Normal rate and regular rhythm.  Pulmonary:     Effort: Pulmonary effort is normal. No respiratory distress.     Breath sounds: Normal breath sounds. No wheezing or rales.  Abdominal:     General: Bowel sounds are normal. There is no distension.     Palpations: Abdomen is soft.     Tenderness: There is no abdominal tenderness. There is no rebound.  Musculoskeletal:     Cervical back: Normal range of motion.     Right lower leg: Edema present.     Left lower leg: Edema present.  Skin:    General: Skin is warm and dry.     Findings: Rash present.     Comments: Redness bilateral legs to knees with 1 open lesion right calf about 1 cm circular to the  tissue, no drainage expressible  Neurological:     Mental Status: He is alert and oriented to person, place, and time.     Coordination: Coordination abnormal.      Vitals:   03/08/20 1438  BP: 140/78  Pulse: 69  Temp: 97.7 F (36.5 C)  TempSrc: Oral  SpO2: 97%    This visit occurred during the SARS-CoV-2 public health emergency.  Safety protocols were in place, including screening questions prior to the visit, additional usage of staff PPE, and extensive cleaning of exam room while observing appropriate contact time as indicated for disinfecting solutions.   Assessment & Plan:

## 2020-03-08 NOTE — Patient Instructions (Signed)
We have sent in the bactrim to take 1 pill twice a day for 2 weeks.

## 2020-03-09 NOTE — Assessment & Plan Note (Signed)
With surrounding cellulitis today and rx bactrim.

## 2020-03-12 ENCOUNTER — Other Ambulatory Visit: Payer: Medicare Other

## 2020-03-18 ENCOUNTER — Other Ambulatory Visit: Payer: Self-pay | Admitting: Cardiology

## 2020-03-21 ENCOUNTER — Telehealth: Payer: Self-pay | Admitting: Internal Medicine

## 2020-03-21 MED ORDER — CEPHALEXIN 500 MG PO CAPS: 500.0000 mg | ORAL_CAPSULE | Freq: Two times a day (BID) | ORAL | 0 refills | Status: DC

## 2020-03-21 NOTE — Telephone Encounter (Signed)
Pt informed of below.  

## 2020-03-21 NOTE — Telephone Encounter (Signed)
Sent in keflex to take instead 1 pill twice a day for 2 weeks.

## 2020-03-21 NOTE — Telephone Encounter (Signed)
Patient was seen on 11.18.21 for cellulitis and he has now taken all of the bactrim and his feet are still swollen, he still has the rash, and they are still open so he is wondering if he needs to get another medication or what he should do.  [phone]

## 2020-03-23 ENCOUNTER — Other Ambulatory Visit: Payer: Self-pay | Admitting: Internal Medicine

## 2020-03-23 DIAGNOSIS — Z79899 Other long term (current) drug therapy: Secondary | ICD-10-CM | POA: Diagnosis not present

## 2020-03-23 DIAGNOSIS — Z96642 Presence of left artificial hip joint: Secondary | ICD-10-CM | POA: Diagnosis not present

## 2020-03-23 DIAGNOSIS — M5416 Radiculopathy, lumbar region: Secondary | ICD-10-CM | POA: Diagnosis not present

## 2020-03-23 DIAGNOSIS — M542 Cervicalgia: Secondary | ICD-10-CM | POA: Diagnosis not present

## 2020-03-23 DIAGNOSIS — M546 Pain in thoracic spine: Secondary | ICD-10-CM | POA: Diagnosis not present

## 2020-03-29 ENCOUNTER — Other Ambulatory Visit: Payer: Medicare Other

## 2020-03-29 DIAGNOSIS — I739 Peripheral vascular disease, unspecified: Secondary | ICD-10-CM | POA: Diagnosis not present

## 2020-03-29 DIAGNOSIS — J441 Chronic obstructive pulmonary disease with (acute) exacerbation: Secondary | ICD-10-CM | POA: Diagnosis not present

## 2020-03-29 DIAGNOSIS — G809 Cerebral palsy, unspecified: Secondary | ICD-10-CM | POA: Diagnosis not present

## 2020-03-29 DIAGNOSIS — M549 Dorsalgia, unspecified: Secondary | ICD-10-CM | POA: Diagnosis not present

## 2020-04-02 ENCOUNTER — Other Ambulatory Visit: Payer: Self-pay

## 2020-04-02 ENCOUNTER — Telehealth: Payer: Self-pay | Admitting: Internal Medicine

## 2020-04-02 ENCOUNTER — Ambulatory Visit
Admission: RE | Admit: 2020-04-02 | Discharge: 2020-04-02 | Disposition: A | Discharge status: Home / Self Care | Payer: Medicare Other | Source: Ambulatory Visit | Attending: Cardiology | Admitting: Cardiology

## 2020-04-02 DIAGNOSIS — K8020 Calculus of gallbladder without cholecystitis without obstruction: Secondary | ICD-10-CM | POA: Diagnosis not present

## 2020-04-02 DIAGNOSIS — I714 Abdominal aortic aneurysm, without rupture, unspecified: Secondary | ICD-10-CM

## 2020-04-02 DIAGNOSIS — N1330 Unspecified hydronephrosis: Secondary | ICD-10-CM | POA: Diagnosis not present

## 2020-04-02 DIAGNOSIS — I739 Peripheral vascular disease, unspecified: Secondary | ICD-10-CM

## 2020-04-02 DIAGNOSIS — K8080 Other cholelithiasis without obstruction: Secondary | ICD-10-CM | POA: Diagnosis not present

## 2020-04-02 NOTE — Telephone Encounter (Signed)
Does he feel that the keflex has caused improvement, stability, worsening? We can also plan to get him in with wound care but likely swelling will remain. Is redness improving?

## 2020-04-02 NOTE — Telephone Encounter (Addendum)
I called pt- he states the Keflex has helped slightly with some mild improvements in redness but not like he would like. He has 2 doses left.

## 2020-04-02 NOTE — Telephone Encounter (Signed)
Called pt- no answer/unable to leave vm.  

## 2020-04-02 NOTE — Telephone Encounter (Signed)
Patient called and said that both his legs are doing better but they still having a little bleeding, they are still swollen, and red. He was wanting a call back [phone].

## 2020-04-03 ENCOUNTER — Telehealth: Payer: Self-pay | Admitting: Internal Medicine

## 2020-04-03 MED ORDER — CEPHALEXIN 500 MG PO CAPS: 500.0000 mg | ORAL_CAPSULE | Freq: Two times a day (BID) | ORAL | 0 refills | Status: AC

## 2020-04-03 NOTE — Telephone Encounter (Signed)
Tiara with [employer] called and said that the patient is requesting home health care. She said that she expressed to him that the patient would need a doctors order. Please call the patient back [phone]

## 2020-04-03 NOTE — Telephone Encounter (Signed)
Refilled keflex for another 2 weeks, if no improvement we will need to get him in with wound clinic.

## 2020-04-03 NOTE — Addendum Note (Signed)
Addended by: [name] on: 04/03/2020 08:33 AM   Modules accepted: Orders

## 2020-04-04 NOTE — Telephone Encounter (Signed)
Pt notified of the additional 2wks of abx & to call if no improvement so referral can be set up with the wound clinic.  Pt able to repeat instructions back.

## 2020-04-05 NOTE — Telephone Encounter (Signed)
I called pt- he states he is getting to the point where he needs help with household chores like cooking, laundry and cleaning. He states he can barely shave and shower and get up/down from his toilet.

## 2020-04-05 NOTE — Telephone Encounter (Signed)
Can we find out what kind of home health services he is requesting? I.e. Pt/nursing

## 2020-04-05 NOTE — Telephone Encounter (Signed)
Insurance is not going to cover a home health aide. If he is not able to take care of himself long term care insurance would cover this and he would need to go through them or private pay or consider assisted living facility which insurance will help cover.

## 2020-04-06 NOTE — Telephone Encounter (Signed)
Pt informed of below.  

## 2020-04-10 ENCOUNTER — Other Ambulatory Visit: Payer: Self-pay | Admitting: Cardiology

## 2020-04-10 ENCOUNTER — Other Ambulatory Visit: Payer: Self-pay | Admitting: Internal Medicine

## 2020-04-23 DIAGNOSIS — Z96642 Presence of left artificial hip joint: Secondary | ICD-10-CM | POA: Diagnosis not present

## 2020-04-23 DIAGNOSIS — M5416 Radiculopathy, lumbar region: Secondary | ICD-10-CM | POA: Diagnosis not present

## 2020-04-23 DIAGNOSIS — M542 Cervicalgia: Secondary | ICD-10-CM | POA: Diagnosis not present

## 2020-04-23 DIAGNOSIS — Z9189 Other specified personal risk factors, not elsewhere classified: Secondary | ICD-10-CM | POA: Diagnosis not present

## 2020-04-29 DIAGNOSIS — M549 Dorsalgia, unspecified: Secondary | ICD-10-CM | POA: Diagnosis not present

## 2020-04-29 DIAGNOSIS — G809 Cerebral palsy, unspecified: Secondary | ICD-10-CM | POA: Diagnosis not present

## 2020-04-29 DIAGNOSIS — I739 Peripheral vascular disease, unspecified: Secondary | ICD-10-CM | POA: Diagnosis not present

## 2020-04-29 DIAGNOSIS — J441 Chronic obstructive pulmonary disease with (acute) exacerbation: Secondary | ICD-10-CM | POA: Diagnosis not present

## 2020-05-04 ENCOUNTER — Telehealth: Payer: Self-pay | Admitting: Internal Medicine

## 2020-05-04 DIAGNOSIS — L97211 Non-pressure chronic ulcer of right calf limited to breakdown of skin: Secondary | ICD-10-CM

## 2020-05-04 NOTE — Addendum Note (Signed)
Addended by: [name] A on: 05/04/2020 04:22 PM   Modules accepted: Orders

## 2020-05-04 NOTE — Telephone Encounter (Signed)
Wound care referral done. We cannot make the skin tougher but wound care can help.

## 2020-05-04 NOTE — Telephone Encounter (Signed)
Patient called and was requesting a call back. He said he had several questions but did not specify what they were. He can be reached at [phone].

## 2020-05-04 NOTE — Telephone Encounter (Signed)
I called pt- he states he has completed the antibiotics. He still c/o bilateral LE edema and weeping. He is requesting a referral for wound care and he wants to know if you can give him something to "make the skin on his legs tougher".

## 2020-05-07 MED ORDER — LIDOCAINE 5 % EX OINT: 1.0000 "application " | TOPICAL_OINTMENT | CUTANEOUS | 2 refills | Status: DC | PRN

## 2020-05-07 NOTE — Telephone Encounter (Signed)
Pt informed of below.  

## 2020-05-07 NOTE — Telephone Encounter (Signed)
Called pt to inform of below. No answer/unable to leave vm. Will try again later.  

## 2020-05-07 NOTE — Addendum Note (Signed)
Addended by: Cresenciano Lick on: 05/07/2020 02:40 PM   Modules accepted: Orders

## 2020-05-08 ENCOUNTER — Other Ambulatory Visit: Payer: Self-pay | Admitting: Family

## 2020-05-09 NOTE — Telephone Encounter (Signed)
Would you like to see the patient in office or virtually before sending something in? Please advise

## 2020-05-09 NOTE — Telephone Encounter (Signed)
See below

## 2020-05-09 NOTE — Telephone Encounter (Signed)
Patient states the swelling on the back of his leg has been really bad so he is wondering if there is anything we can send in for him

## 2020-05-09 NOTE — Telephone Encounter (Signed)
    Patient calling to report he has no transportation for appointment tomorrow Unable to do video visit, technology issue Patient states wound care can not see him for a few weeks

## 2020-05-10 ENCOUNTER — Other Ambulatory Visit: Payer: Self-pay

## 2020-05-10 ENCOUNTER — Telehealth (INDEPENDENT_AMBULATORY_CARE_PROVIDER_SITE_OTHER): Payer: Medicare Other | Admitting: Internal Medicine

## 2020-05-10 DIAGNOSIS — L03119 Cellulitis of unspecified part of limb: Secondary | ICD-10-CM | POA: Diagnosis not present

## 2020-05-10 DIAGNOSIS — R7309 Other abnormal glucose: Secondary | ICD-10-CM

## 2020-05-10 MED ORDER — SULFAMETHOXAZOLE-TRIMETHOPRIM 800-160 MG PO TABS: 1.0000 | ORAL_TABLET | Freq: Two times a day (BID) | ORAL | 0 refills | Status: DC

## 2020-05-10 NOTE — Telephone Encounter (Signed)
Patient mentioned that he has mychart video visit scheduled for today with Dr. Jenny Reichmann. He stated that his daughter in law is going to help him with the virtual visit. He wanted to make Dr. Sharlet Salina aware of what's going on.

## 2020-05-10 NOTE — Telephone Encounter (Signed)
If possible we would like to see him in office or with video visit to assess the legs to make a decision if he needs more antibiotics. Just for swelling he may not need antibiotics but if there is rash on the skin etc he may need additional antibiotics.

## 2020-05-10 NOTE — Telephone Encounter (Signed)
Please call the patient. He is having problems with transportation to get to his appointments. He is requesting a call back from Dr. Nathanial Millman nurse. He can be reached at [phone].

## 2020-05-10 NOTE — Progress Notes (Signed)
Patient ID: [name], male   DOB: [birth year], 80 y.o.   MRN: [id number]  Virtual Visit via Video Note  I connected with [name] on 05/10/20 at  1:00 PM EST by a video enabled telemedicine application and verified that I am speaking with the correct person using two identifiers.  Location of all participants today Patient: at home with wife present Provider: at office   I discussed the limitations of evaluation and management by telemedicine and the availability of in person appointments. The patient expressed understanding and agreed to proceed.  History of Present Illness: Here with c/o bilateral right > left dime sized calf redness, swelling and slight oozing blood x 3 days, without fever, chills or red streaks.  Has hx of CP and non ambulatory with legs chronic dependent in motorized wheelchair.  Also on left dorsal foot is < dime sized red, tender area as well.  Has HH to start soon with wound care and leg wraps.  Pt denies chest pain, increased sob or doe, wheezing, orthopnea, PND, palpitations, dizziness or syncope.  Pt denies polydipsia, polyuria,  Pt denies fever, wt loss, night sweats, loss of appetite, or other constitutional symptoms Past Medical History:  Diagnosis Date  . AAA (abdominal aortic aneurysm) ([place])   . Blindness of left eye   . 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 CAD (coronary artery disease)    a. s/p CABG in 10/2008 with LIMA-LAD, SVG-OM1 with Y-graft to PL branch, and SVG-RCA  . Carotid stenosis   . Cerebrovascular disease   . CHF (congestive heart failure) ([place])   . Cirrhosis ([place])    on CT a/p 07/2013, no longer drinking  . CONGENITAL UNSPEC REDUCTION DEFORMITY LOWER LIMB   . Constipation due to opioid therapy 2014   began about a year ago  . COPD (chronic obstructive pulmonary disease) ([place])   . HTN (hypertension)   . Hyperlipidemia   . 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  Peripheral vascular disease ([place])   . TOBACCO USE, QUIT    Past Surgical History:  Procedure Laterality Date  . CORONARY ARTERY BYPASS GRAFT  11/03/2008   Ricard Dillon - x4: left internal mammary artery to the distal left anterior descending, saphemous vein graft to the first circumflex marginal branch with a Y graft sequentiallly to a left posterolateral branch, saphenous vein graft to the distal right coronary artery  . ELECTROPHYSIOLOGIC STUDY N/A 01/18/2015   Procedure: A-Flutter Ablation;  Surgeon: Deboraha Sprang, MD;  Location: [facility];  Service: Cardiovascular;  Laterality: N/A;  . GREEN LIGHT LASER TURP (TRANSURETHRAL RESECTION OF PROSTATE N/A 02/14/2014   Procedure: GREEN LIGHT LASER TURP (TRANSURETHRAL RESECTION OF PROSTATE;  Surgeon: Festus Aloe, MD;  Location: [facility];  Service: Urology;  Laterality: N/A;  . HIP ARTHROPLASTY Left 01/26/2018   Procedure: LEFT HIP POSTERIOR HEMIARTHROPLASTY;  Surgeon: [name], MD;  Location: [facility];  Service: Orthopedics;  Laterality: Left;  . HIP CLOSED REDUCTION Left 09/18/2018   Procedure: CLOSED MANIPULATION HIP;  Surgeon: Justice Britain, MD;  Location: [facility];  Service: Orthopedics;  Laterality: Left;  . HIP CLOSED REDUCTION Left 10/08/2018   Procedure: CLOSED MANIPULATION HIP;  Surgeon: Rod Can, MD;  Location: [facility];  Service: Orthopedics;  Laterality: Left;  . TOTAL HIP REVISION Left 06/24/2018   Procedure: ACETABULAR HIP REVISION POSTERIOR;  Surgeon: [name], MD;  Location: [facility];  Service: Orthopedics;  Laterality: Left;  .  TOTAL HIP REVISION Left 10/21/2018   Procedure: POSTERIOR REVISION HIP WITH POSSIBLE CONSTRAINED LINER;  Surgeon: [name], MD;  Location: [facility];  Service: Orthopedics;  Laterality: Left;    reports that he has quit smoking. He has never used smokeless tobacco. He reports that he does not drink alcohol and does not use drugs. family history includes Cancer in his brother; Cirrhosis in his  mother; Heart attack in his father; Other in his brother and brother. Allergies  Allergen Reactions  . Doxycycline   . Gabapentin Other (See Comments)    Pt states make him go down to the floor and can not get up  . Amlodipine Nausea And Vomiting and Other (See Comments)    dizziness  . Fish Allergy Nausea And Vomiting    STATES HE HAS NOT EATEN ANY FISH INCLUDING SHELLFISH FOR PAST 40 YRS - IT CAUSED NAUSEA  . Hydrocodone Itching and Rash  . Other Nausea And Vomiting    STATES HE HAS NOT EATEN ANY FISH INCLUDING SHELLFISH FOR PAST 40 YRS - IT CAUSED NAUSEA  . Tylenol [Acetaminophen] Other (See Comments)    Liver problems   Current Outpatient Medications on File Prior to Visit  Medication Sig Dispense Refill  . albuterol (VENTOLIN HFA) 108 (90 Base) MCG/ACT inhaler TAKE 2 PUFFS BY MOUTH EVERY 6 HOURS AS NEEDED FOR WHEEZE OR SHORTNESS OF BREATH 18 each 2  . allopurinol (ZYLOPRIM) 100 MG tablet     . b complex vitamins tablet Take 1 tablet by mouth daily.    . Cholecalciferol (VITAMIN D3 PO) Take by mouth.    Arne Cleveland 5 MG TABS tablet TAKE 1 TABLET BY MOUTH TWICE A DAY 180 tablet 3  . famotidine (PEPCID) 40 MG tablet Take 40 mg by mouth daily.    . ferrous sulfate 325 (65 FE) MG tablet Take 325 mg by mouth daily with breakfast.    . finasteride (PROSCAR) 5 MG tablet TAKE 1 TABLET BY MOUTH EVERY DAY 90 tablet 1  . fluticasone (FLONASE) 50 MCG/ACT nasal spray PLACE 1 SPRAY INTO BOTH NOSTRILS EVERY MORNING. 48 mL 1  . Guaifenesin 1200 MG TB12 Take 1 tablet (1,200 mg total) by mouth 2 (two) times daily. (Patient taking differently: Take 1,200 mg by mouth 2 (two) times daily as needed (mucus). ) 180 each 3  . KLOR-CON M20 20 MEQ tablet TAKE 1 TABLET BY MOUTH EVERY DAY 90 tablet 1  . lactulose (CHRONULAC) 10 GM/15ML solution TAKE 30 MLS BY MOUTH 2 TIMES DAILY AS NEEDED FOR MILD CONSTIPATION OR MODERATE CONSTIPATION (Patient taking differently: Take 20 g by mouth 2 (two) times daily as needed  for mild constipation. ) 1892 mL 6  . lidocaine (XYLOCAINE) 5 % ointment Apply 1 application topically as needed. 240 g 2  . Multiple Vitamins-Minerals (ZINC PO) Take by mouth.    . Naloxone HCl (NARCAN NA) Place 1 application into the nose once.    . nystatin ointment (MYCOSTATIN) APPLY TO AFFECTED AREA TWICE A DAY 30 g 3  . oxyCODONE (ROXICODONE) 15 MG immediate release tablet Take 1 tablet (15 mg total) by mouth every 4 (four) hours as needed for pain. 150 tablet 0  . predniSONE (DELTASONE) 10 MG tablet TAKE 2 TABLETS (20 MG TOTAL) BY MOUTH DAILY WITH BREAKFAST. 60 tablet 2  . rosuvastatin (CRESTOR) 40 MG tablet Take 1 tablet (40 mg total) by mouth daily. 90 tablet 3  . spironolactone (ALDACTONE) 25 MG tablet TAKE 1 TABLET BY MOUTH  EVERY DAY 90 tablet 1  . SYMBICORT 160-4.5 MCG/ACT inhaler TAKE 2 PUFFS BY MOUTH TWICE A DAY 10.2 each 3  . tamsulosin (FLOMAX) 0.4 MG CAPS capsule TAKE 1 CAPSULE BY MOUTH EVERY DAY 90 capsule 1  . traZODone (DESYREL) 100 MG tablet TAKE 5 TABLETS BY MOUTH AT BEDTIME     No current facility-administered medications on file prior to visit.    Observations/Objective: Alert, NAD, appropriate mood and affect, resps normal, cn 2-12 intact, moves all 4s, no visible rash or swelling Lab Results  Component Value Date   WBC 7.8 02/13/2020   HGB 12.4 (L) 02/13/2020   HCT 37.4 (L) 02/13/2020   PLT 222 02/13/2020   GLUCOSE 203 (H) 02/13/2020   CHOL 185 02/13/2020   TRIG 295 (H) 02/13/2020   HDL 52 02/13/2020   LDLCALC 85 02/13/2020   ALT 28 02/13/2020   AST 24 02/13/2020   NA 140 02/13/2020   K 4.5 02/13/2020   CL 99 02/13/2020   CREATININE 1.75 (H) 02/13/2020   BUN 51 (H) 02/13/2020   CO2 24 02/13/2020   TSH 3.48 12/22/2014   PSA 10.64 (H) 10/21/2017   INR 1.17 02/07/2014   HGBA1C 5.7 04/11/2013   Assessment and Plan: See notes  Follow Up Instructions: See notes   I discussed the assessment and treatment plan with the patient. The patient was provided  an opportunity to ask questions and all were answered. The patient agreed with the plan and demonstrated an understanding of the instructions.   The patient was advised to call back or seek an in-person evaluation if the symptoms worsen or if the condition fails to improve as anticipated.   Cathlean Cower, MD

## 2020-05-11 ENCOUNTER — Telehealth: Payer: Self-pay | Admitting: Internal Medicine

## 2020-05-11 NOTE — Telephone Encounter (Signed)
Patient is requesting a call back. Did not specify for why. He can be reached at [phone].

## 2020-05-14 ENCOUNTER — Ambulatory Visit: Payer: Medicare Other | Admitting: Internal Medicine

## 2020-05-16 ENCOUNTER — Other Ambulatory Visit: Payer: Self-pay | Admitting: Internal Medicine

## 2020-05-17 ENCOUNTER — Ambulatory Visit: Payer: Medicare Other | Admitting: Internal Medicine

## 2020-05-20 ENCOUNTER — Encounter: Payer: Self-pay | Admitting: Internal Medicine

## 2020-05-20 DIAGNOSIS — L0390 Cellulitis, unspecified: Secondary | ICD-10-CM | POA: Insufficient documentation

## 2020-05-20 NOTE — Assessment & Plan Note (Signed)
Mild to mod, for antibx course, cont plan for [facility] and leg wraps, f/u PCP,  to f/u any worsening symptoms or concerns

## 2020-05-20 NOTE — Assessment & Plan Note (Signed)
Lab Results  Component Value Date   HGBA1C 5.7 04/11/2013   Stable, pt to continue current medical treatment  - diet  Current Outpatient Medications (Endocrine & Metabolic):  .  predniSONE (DELTASONE) 10 MG tablet, TAKE 2 TABLETS (20 MG TOTAL) BY MOUTH DAILY WITH BREAKFAST.  Current Outpatient Medications (Cardiovascular):  .  furosemide (LASIX) 80 MG tablet, TAKE 1 TABLET BY MOUTH EVERY DAY .  rosuvastatin (CRESTOR) 40 MG tablet, Take 1 tablet (40 mg total) by mouth daily. [name]  spironolactone (ALDACTONE) 25 MG tablet, TAKE 1 TABLET BY MOUTH EVERY DAY  Current Outpatient Medications (Respiratory):  .  albuterol (VENTOLIN HFA) 108 (90 Base) MCG/ACT inhaler, TAKE 2 PUFFS BY MOUTH EVERY 6 HOURS AS NEEDED FOR WHEEZE OR SHORTNESS OF BREATH .  fluticasone (FLONASE) 50 MCG/ACT nasal spray, PLACE 1 SPRAY INTO BOTH NOSTRILS EVERY MORNING. .  Guaifenesin 1200 MG TB12, Take 1 tablet (1,200 mg total) by mouth 2 (two) times daily. (Patient taking differently: Take 1,200 mg by mouth 2 (two) times daily as needed (mucus). ) .  SYMBICORT 160-4.5 MCG/ACT inhaler, TAKE 2 PUFFS BY MOUTH TWICE A DAY  Current Outpatient Medications (Analgesics):  .  allopurinol (ZYLOPRIM) 100 MG tablet,  .  oxyCODONE (ROXICODONE) 15 MG immediate release tablet, Take 1 tablet (15 mg total) by mouth every 4 (four) hours as needed for pain.  Current Outpatient Medications (Hematological):  [name]  ELIQUIS 5 MG TABS tablet, TAKE 1 TABLET BY MOUTH TWICE A DAY .  ferrous sulfate 325 (65 FE) MG tablet, Take 325 mg by mouth daily with breakfast.  Current Outpatient Medications (Other):  .  sulfamethoxazole-trimethoprim (BACTRIM DS) 800-160 MG tablet, Take 1 tablet by mouth 2 (two) times daily. [name]  b complex vitamins tablet, Take 1 tablet by mouth daily. .  Cholecalciferol (VITAMIN D3 PO), Take by mouth. .  famotidine (PEPCID) 40 MG tablet, Take 40 mg by mouth daily. .  finasteride (PROSCAR) 5 MG tablet, TAKE 1 TABLET BY MOUTH EVERY  DAY .  KLOR-CON M20 20 MEQ tablet, TAKE 1 TABLET BY MOUTH EVERY DAY .  lactulose (CHRONULAC) 10 GM/15ML solution, TAKE 30 MLS BY MOUTH 2 TIMES DAILY AS NEEDED FOR MILD CONSTIPATION OR MODERATE CONSTIPATION (Patient taking differently: Take 20 g by mouth 2 (two) times daily as needed for mild constipation. ) .  lidocaine (XYLOCAINE) 5 % ointment, Apply 1 application topically as needed. .  Multiple Vitamins-Minerals (ZINC PO), Take by mouth. .  Naloxone HCl (NARCAN NA), Place 1 application into the nose once. .  nystatin ointment (MYCOSTATIN), APPLY TO AFFECTED AREA TWICE A DAY .  tamsulosin (FLOMAX) 0.4 MG CAPS capsule, TAKE 1 CAPSULE BY MOUTH EVERY DAY .  traZODone (DESYREL) 100 MG tablet, TAKE 5 TABLETS BY MOUTH AT BEDTIME

## 2020-05-20 NOTE — Patient Instructions (Signed)
Please take all new medication as prescribed 

## 2020-05-22 ENCOUNTER — Other Ambulatory Visit: Payer: Self-pay | Admitting: Internal Medicine

## 2020-05-22 ENCOUNTER — Ambulatory Visit: Payer: Medicare Other | Admitting: Internal Medicine

## 2020-05-24 ENCOUNTER — Ambulatory Visit: Payer: Medicare Other | Admitting: Internal Medicine

## 2020-05-24 NOTE — Telephone Encounter (Signed)
For the scooter we would need to restart the whole process of when he got the wheelchair and it is unclear if they would pay for this since they just paid for an electric wheelchair. The hospital bed we can order but he may need a visit as some things have to be documented for this. Can do virtual for this and for his legs although with new leg weakness I would still recommend ER to rule out stroke.

## 2020-05-24 NOTE — Telephone Encounter (Signed)
Patient is going to the ED. Can we cancel his appt so that he doesn't get a No show fee. Thanks.

## 2020-05-24 NOTE — Telephone Encounter (Signed)
See below

## 2020-05-24 NOTE — Telephone Encounter (Signed)
Patient's appointment for 2.3.22 at 4pm has been cancelled.

## 2020-05-24 NOTE — Telephone Encounter (Signed)
Yes, agree with ER

## 2020-05-24 NOTE — Telephone Encounter (Signed)
Spoke with the patient and he stated that he was going to the ER

## 2020-05-24 NOTE — Telephone Encounter (Signed)
Patient called and said that last night his daughter in law had to call the paramedics because he was unable to get up and to the restroom last night. Also his legs are not doing any better and someone can not come to his home and wrap his legs until the middle of the month. His daughter in law is wanting to take him to the ER and not wait until the appointment this afternoon. The patient is wanting to know if he should go to the ER or wait until his appointment. He also said that he is ordering a beside toilet. Please advise. [phone].

## 2020-05-24 NOTE — Telephone Encounter (Signed)
Unable to get in contact with the patient. No option to lvm due to the mailbox being full. I will try contact again later.

## 2020-05-24 NOTE — Telephone Encounter (Signed)
For new leg weakness I would recommend ER to rule out stroke

## 2020-05-24 NOTE — Telephone Encounter (Signed)
Spoke with the patient and he stated that he doesn't want to go to the hospital due to him spending so much time in there last year. He stated that he needs a script sent to [facility] in order for him to receive a scooter, he stated that the wheelchair that he has now is big and bulky to move around in his mobile home. He also mentioned that he needs a script for a hospital bed.

## 2020-05-24 NOTE — Telephone Encounter (Signed)
Patient is declining the emergency room because he doesn't want to have to stay there overnight. He wants to know if there is anything we can do for him

## 2020-05-25 NOTE — Telephone Encounter (Signed)
See below

## 2020-05-25 NOTE — Telephone Encounter (Signed)
   FYI Patient states he plans to have his son take him to urgent care today after 3:30

## 2020-05-28 DIAGNOSIS — Z79899 Other long term (current) drug therapy: Secondary | ICD-10-CM | POA: Diagnosis not present

## 2020-05-28 DIAGNOSIS — Z96642 Presence of left artificial hip joint: Secondary | ICD-10-CM | POA: Diagnosis not present

## 2020-05-28 DIAGNOSIS — M5416 Radiculopathy, lumbar region: Secondary | ICD-10-CM | POA: Diagnosis not present

## 2020-05-28 DIAGNOSIS — L0390 Cellulitis, unspecified: Secondary | ICD-10-CM | POA: Diagnosis not present

## 2020-05-28 DIAGNOSIS — M542 Cervicalgia: Secondary | ICD-10-CM | POA: Diagnosis not present

## 2020-05-30 DIAGNOSIS — M549 Dorsalgia, unspecified: Secondary | ICD-10-CM | POA: Diagnosis not present

## 2020-05-30 DIAGNOSIS — I739 Peripheral vascular disease, unspecified: Secondary | ICD-10-CM | POA: Diagnosis not present

## 2020-05-30 DIAGNOSIS — G809 Cerebral palsy, unspecified: Secondary | ICD-10-CM | POA: Diagnosis not present

## 2020-05-30 DIAGNOSIS — J441 Chronic obstructive pulmonary disease with (acute) exacerbation: Secondary | ICD-10-CM | POA: Diagnosis not present

## 2020-06-01 ENCOUNTER — Ambulatory Visit (INDEPENDENT_AMBULATORY_CARE_PROVIDER_SITE_OTHER): Payer: Medicare Other | Admitting: Family

## 2020-06-01 ENCOUNTER — Other Ambulatory Visit: Payer: Self-pay

## 2020-06-01 ENCOUNTER — Encounter: Payer: Self-pay | Admitting: Family

## 2020-06-01 VITALS — BP 122/78 | HR 75 | Temp 98.2°F | Ht 70.0 in | Wt 178.0 lb

## 2020-06-01 DIAGNOSIS — I739 Peripheral vascular disease, unspecified: Secondary | ICD-10-CM | POA: Diagnosis not present

## 2020-06-01 DIAGNOSIS — L03119 Cellulitis of unspecified part of limb: Secondary | ICD-10-CM

## 2020-06-01 DIAGNOSIS — I872 Venous insufficiency (chronic) (peripheral): Secondary | ICD-10-CM | POA: Diagnosis not present

## 2020-06-01 MED ORDER — SULFAMETHOXAZOLE-TRIMETHOPRIM 800-160 MG PO TABS: 1.0000 | ORAL_TABLET | Freq: Two times a day (BID) | ORAL | 0 refills | Status: DC

## 2020-06-01 NOTE — Progress Notes (Signed)
[name] is a 80 y.o. male with the following history as recorded in EpicCare:  Patient Active Problem List   Diagnosis Date Noted  . Cellulitis 05/20/2020  . Bilateral hand pain 11/03/2019  . Anticoagulated 08/19/2019  . PAD (peripheral artery disease) ([place]) 07/27/2019  . Ulcer of right calf, limited to breakdown of skin ([place]) 07/21/2019  . Olecranon bursitis of left elbow 07/02/2018  . Failed total hip arthroplasty with dislocation, initial encounter ([place]) 06/22/2018  . Chronic diastolic heart failure ([place]) 03/16/2017  . CKD (chronic kidney disease) stage 3, GFR 30-59 ml/min ([facility]) 03/16/2017  . Diastolic dysfunction 46/65/9935  . Opiate dependence ([place]) 02/13/2017  . Acute renal failure superimposed on stage 3 chronic kidney disease ([place]) 01/21/2017  . Chronic atrial fibrillation ([place]) 11/24/2014  . Aortic stenosis 03/24/2014  . Second degree AV block, Mobitz type I 09/06/2013  . Cirrhosis ([place]) 07/28/2013  . Bladder outlet obstruction 07/28/2013  . Impaired glucose metabolism   . Chronic back pain   . Benign neoplasm of colon 01/08/2011  . Abdominal aortic aneurysm ([place]) 08/12/2010  . TOBACCO USE, QUIT 04/11/2009  . Occlusion and stenosis of carotid artery 01/08/2009  . Hyperlipidemia 11/13/2008  . ANEMIA 11/13/2008  . Essential hypertension 11/13/2008  . Hx of CABG 11/13/2008  . COPD (chronic obstructive pulmonary disease) ([place]) 11/13/2008    Current Outpatient Medications  Medication Sig Dispense Refill  . albuterol (VENTOLIN HFA) 108 (90 Base) MCG/ACT inhaler TAKE 2 PUFFS BY MOUTH EVERY 6 HOURS AS NEEDED FOR WHEEZE OR SHORTNESS OF BREATH 18 each 2  . allopurinol (ZYLOPRIM) 100 MG tablet     . b complex vitamins tablet Take 1 tablet by mouth daily.    . Cholecalciferol (VITAMIN D3 PO) Take by mouth.    Arne [place] 5 MG TABS tablet TAKE 1 TABLET BY MOUTH TWICE A DAY 180 tablet 3  . famotidine (PEPCID) 40 MG tablet TAKE 1 TABLET BY MOUTH EVERY DAY 90 tablet 0  .  ferrous sulfate 325 (65 FE) MG tablet Take 325 mg by mouth daily with breakfast.    . finasteride (PROSCAR) 5 MG tablet TAKE 1 TABLET BY MOUTH EVERY DAY 90 tablet 1  . fluticasone (FLONASE) 50 MCG/ACT nasal spray PLACE 1 SPRAY INTO BOTH NOSTRILS EVERY MORNING. 48 mL 1  . furosemide (LASIX) 80 MG tablet TAKE 1 TABLET BY MOUTH EVERY DAY 90 tablet 1  . Guaifenesin 1200 MG TB12 Take 1 tablet (1,200 mg total) by mouth 2 (two) times daily. (Patient taking differently: Take 1,200 mg by mouth 2 (two) times daily as needed (mucus).) 180 each 3  . KLOR-CON M20 20 MEQ tablet TAKE 1 TABLET BY MOUTH EVERY DAY 90 tablet 1  . lactulose (CHRONULAC) 10 GM/15ML solution TAKE 30 MLS BY MOUTH 2 TIMES DAILY AS NEEDED FOR MILD CONSTIPATION OR MODERATE CONSTIPATION (Patient taking differently: Take 20 g by mouth 2 (two) times daily as needed for mild constipation.) 1892 mL 6  . lidocaine (XYLOCAINE) 5 % ointment Apply 1 application topically as needed. 240 g 2  . Multiple Vitamins-Minerals (ZINC PO) Take by mouth.    . Naloxone HCl (NARCAN NA) Place 1 application into the nose once.    . nystatin ointment (MYCOSTATIN) APPLY TO AFFECTED AREA TWICE A DAY 30 g 3  . oxyCODONE (ROXICODONE) 15 MG immediate release tablet Take 1 tablet (15 mg total) by mouth every 4 (four) hours as needed for pain. 150 tablet 0  . predniSONE (DELTASONE) 10 MG tablet  TAKE 2 TABLETS (20 MG TOTAL) BY MOUTH DAILY WITH BREAKFAST. 60 tablet 2  . spironolactone (ALDACTONE) 25 MG tablet TAKE 1 TABLET BY MOUTH EVERY DAY 90 tablet 1  . sulfamethoxazole-trimethoprim (BACTRIM DS) 800-160 MG tablet Take 1 tablet by mouth 2 (two) times daily. 14 tablet 0  . SYMBICORT 160-4.5 MCG/ACT inhaler TAKE 2 PUFFS BY MOUTH TWICE A DAY 10.2 each 3  . tamsulosin (FLOMAX) 0.4 MG CAPS capsule TAKE 1 CAPSULE BY MOUTH EVERY DAY 90 capsule 1  . traZODone (DESYREL) 100 MG tablet TAKE 5 TABLETS BY MOUTH AT BEDTIME    . rosuvastatin (CRESTOR) 40 MG tablet Take 1 tablet (40 mg  total) by mouth daily. 90 tablet 3   No current facility-administered medications for this visit.    Allergies: Doxycycline, Gabapentin, Amlodipine, Fish allergy, Hydrocodone, Other, and Tylenol [acetaminophen]  Past Medical History:  Diagnosis Date  . AAA (abdominal aortic aneurysm) ([place])   . Blindness of left eye   . 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 CAD (coronary artery disease)    a. s/p CABG in 10/2008 with LIMA-LAD, SVG-OM1 with Y-graft to PL branch, and SVG-RCA  . Carotid stenosis   . Cerebrovascular disease   . CHF (congestive heart failure) ([place])   . Cirrhosis ([place])    on CT a/p 07/2013, no longer drinking  . CONGENITAL UNSPEC REDUCTION DEFORMITY LOWER LIMB   . Constipation due to opioid therapy 2014   began about a year ago  . COPD (chronic obstructive pulmonary disease) ([place])   . HTN (hypertension)   . Hyperlipidemia   . 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 Peripheral vascular disease ([place])   . TOBACCO USE, QUIT     Past Surgical History:  Procedure Laterality Date  . CORONARY ARTERY BYPASS GRAFT  11/03/2008   Ricard Dillon - x4: left internal mammary artery to the distal left anterior descending, saphemous vein graft to the first circumflex marginal branch with a Y graft sequentiallly to a left posterolateral branch, saphenous vein graft to the distal right coronary artery  . ELECTROPHYSIOLOGIC STUDY N/A 01/18/2015   Procedure: A-Flutter Ablation;  Surgeon: Deboraha Sprang, MD;  Location: [facility];  Service: Cardiovascular;  Laterality: N/A;  . GREEN LIGHT LASER TURP (TRANSURETHRAL RESECTION OF PROSTATE N/A 02/14/2014   Procedure: GREEN LIGHT LASER TURP (TRANSURETHRAL RESECTION OF PROSTATE;  Surgeon: Festus Aloe, MD;  Location: [facility];  Service: Urology;  Laterality: N/A;  . HIP ARTHROPLASTY Left 01/26/2018   Procedure: LEFT HIP POSTERIOR HEMIARTHROPLASTY;  Surgeon: [name], MD;  Location: [facility];   Service: Orthopedics;  Laterality: Left;  . HIP CLOSED REDUCTION Left 09/18/2018   Procedure: CLOSED MANIPULATION HIP;  Surgeon: Justice Britain, MD;  Location: [facility];  Service: Orthopedics;  Laterality: Left;  . HIP CLOSED REDUCTION Left 10/08/2018   Procedure: CLOSED MANIPULATION HIP;  Surgeon: Rod Can, MD;  Location: [facility];  Service: Orthopedics;  Laterality: Left;  . TOTAL HIP REVISION Left 06/24/2018   Procedure: ACETABULAR HIP REVISION POSTERIOR;  Surgeon: [name], MD;  Location: [facility];  Service: Orthopedics;  Laterality: Left;  . TOTAL HIP REVISION Left 10/21/2018   Procedure: POSTERIOR REVISION HIP WITH POSSIBLE CONSTRAINED LINER;  Surgeon: [name], MD;  Location: [facility];  Service: Orthopedics;  Laterality: Left;    Family History  Problem Relation Age of Onset  . Cirrhosis Mother        died at 3  . Heart  attack Father   . Other Brother        GSW  . Other Brother        died in house fire  . Cancer Brother        unsure of type Believes colon or prostate/fim  . Colon cancer Neg Hx   . Stomach cancer Neg Hx     Social History   Tobacco Use  . Smoking status: Former Research scientist (life sciences)  . Smokeless tobacco: Never Used  Substance Use Topics  . Alcohol use: No    Alcohol/week: 0.0 standard drinks    Comment: History of heavy alcohol use per pt. Quit many years ago1/1/ 2005    Subjective:  Accompanied by son; known history of chronic venous insufficiency; per son, he is concerned that his father's legs have been "weeping" for the past 3 weeks; Patient was treated at end of January with Bactrim and did get some relief;   Objective:  Vitals:   06/01/20 1548  BP: 122/78  Pulse: 75  Temp: 98.2 F (36.8 C)  TempSrc: Oral  SpO2: 97%  Weight: 178 lb (80.7 kg)  Height: 5\' 10"  (1.778 m)    General: Well developed, well nourished, in no acute distress  Skin : Warm and dry.  Head: Normocephalic and atraumatic  Lungs: Respirations unlabored;  Extremities: + erythema/  edema, cyanosis, clubbing  Vessels: Symmetric bilaterally  Neurologic: Alert and oriented; speech intact; face symmetrical;   Assessment:  1. PAD (peripheral artery disease) ([place])   2. Cellulitis of lower extremity, unspecified laterality   3. Chronic venous insufficiency     Plan:  Explained to son that this is not a new issue for patient- symptoms have been present for years; patient agrees to go back to vascular specialist- he did not follow-up there in 2021 as requested; keep upcoming visit with wound clinic.  This visit occurred during the SARS-CoV-2 public health emergency.  Safety protocols were in place, including screening questions prior to the visit, additional usage of staff PPE, and extensive cleaning of exam room while observing appropriate contact time as indicated for disinfecting solutions.     No follow-ups on file.  Orders Placed This Encounter  Procedures  . Ambulatory referral to Vascular Surgery    Referral Priority:   Routine    Referral Type:   Surgical    Referral Reason:   Specialty Services Required    Requested Specialty:   Vascular Surgery    Number of Visits Requested:   1    Requested Prescriptions   Signed Prescriptions Disp Refills  . sulfamethoxazole-trimethoprim (BACTRIM DS) 800-160 MG tablet 14 tablet 0    Sig: Take 1 tablet by mouth 2 (two) times daily.

## 2020-06-05 ENCOUNTER — Other Ambulatory Visit: Payer: Self-pay | Admitting: Internal Medicine

## 2020-06-07 NOTE — Telephone Encounter (Signed)
Follow up message  Patient requesting call from [facility] to discuss order for hospital bed and scooter.

## 2020-06-08 NOTE — Telephone Encounter (Signed)
Follow up message   Patient requesting call from [facility]

## 2020-06-11 NOTE — Telephone Encounter (Signed)
LVM for the patient to return my call here at the office. Office number was provided.

## 2020-06-12 ENCOUNTER — Encounter (HOSPITAL_BASED_OUTPATIENT_CLINIC_OR_DEPARTMENT_OTHER): Payer: Medicare Other | Attending: Internal Medicine | Admitting: Internal Medicine

## 2020-06-12 ENCOUNTER — Other Ambulatory Visit: Payer: Self-pay

## 2020-06-12 DIAGNOSIS — L97312 Non-pressure chronic ulcer of right ankle with fat layer exposed: Secondary | ICD-10-CM | POA: Diagnosis not present

## 2020-06-12 DIAGNOSIS — L97129 Non-pressure chronic ulcer of left thigh with unspecified severity: Secondary | ICD-10-CM | POA: Diagnosis not present

## 2020-06-12 DIAGNOSIS — L97229 Non-pressure chronic ulcer of left calf with unspecified severity: Secondary | ICD-10-CM | POA: Insufficient documentation

## 2020-06-12 DIAGNOSIS — I890 Lymphedema, not elsewhere classified: Secondary | ICD-10-CM | POA: Diagnosis not present

## 2020-06-12 DIAGNOSIS — I87333 Chronic venous hypertension (idiopathic) with ulcer and inflammation of bilateral lower extremity: Secondary | ICD-10-CM | POA: Diagnosis not present

## 2020-06-12 DIAGNOSIS — L97529 Non-pressure chronic ulcer of other part of left foot with unspecified severity: Secondary | ICD-10-CM | POA: Insufficient documentation

## 2020-06-12 DIAGNOSIS — L97819 Non-pressure chronic ulcer of other part of right lower leg with unspecified severity: Secondary | ICD-10-CM | POA: Diagnosis not present

## 2020-06-12 DIAGNOSIS — L97212 Non-pressure chronic ulcer of right calf with fat layer exposed: Secondary | ICD-10-CM | POA: Diagnosis not present

## 2020-06-12 NOTE — Progress Notes (Signed)
[name] ([id number]) Visit Report for 06/12/2020 Allergy List Details Patient Name: Date of Service: [name] 06/12/2020 1:15 PM Medical Record Number: [id number] Patient Account Number: [id number] Date of Birth/Sex: Treating RN: [birth year] Mcdowell (80 y.o. [name] Primary Care [name]: [name] Other Clinician: Referring [name]: Treating [name]/Extender: [name] in Treatment: 0 Allergies Active Allergies gabapentin Reaction: muscle weakness doxycycline amlodipine Reaction: nausea/vomiting Fish Containing Products Reaction: nausea/vomiting hydrocodone Reaction: itching/rash acetaminophen Reaction: liver problems Allergy Notes Electronic Signature(s) Signed: 06/12/2020 5:38:13 PM By: Baruch Gouty RN, BSN Entered By: Baruch Gouty on 06/12/2020 14:08:07 -------------------------------------------------------------------------------- Arrival Information Details Patient Name: Date of Service: Brian RNER, [name] 06/12/2020 1:15 PM Medical Record Number: [id number] Patient Account Number: [id number] Date of Birth/Sex: Treating RN: [birth year]Mcdowell (80 y.o. [name] Primary Care [name]: [name] Other Clinician: Referring [name]: Treating [name]/Extender: [name] in Treatment: 0 Visit Information Patient Arrived: Wheel Chair Arrival Time: 13:39 Accompanied By: son Transfer Assistance: EasyPivot Patient Lift Patient Identification Verified: Yes Secondary Verification Process Completed: Yes Patient Requires Transmission-Based Precautions: No Patient Has Alerts: No Electronic Signature(s) Signed: 06/12/2020 5:38:13 PM By: Baruch Gouty RN, BSN Entered By: Baruch Gouty on 06/12/2020 13:59:48 -------------------------------------------------------------------------------- Clinic Level of Care Assessment Details Patient Name: Date of  Service: Brian RNER, [name] 06/12/2020 1:15 PM Medical Record Number: [id number] Patient Account Number: [id number] Date of Birth/Sex: Treating RN: [birth year] (80 y.o. [name], [name]: [name] Other Clinician: Referring Raiford Fetterman: Treating Danasia Baker/Extender: [name] in Treatment: 0 Clinic Level of Care Assessment Items TOOL 1 Quantity Score X- 1 0 Use when EandM and Procedure is performed on INITIAL visit ASSESSMENTS - Nursing Assessment / Reassessment X- 1 20 General Physical Exam (combine w/ comprehensive assessment (listed just below) when performed on new pt. evals) X- 1 25 Comprehensive Assessment (HX, ROS, Risk Assessments, Wounds Hx, etc.) ASSESSMENTS - Wound and Skin Assessment / Reassessment X- 1 10 Dermatologic / Skin Assessment (not related to wound area) ASSESSMENTS - Ostomy and/or Continence Assessment and Care []  - 0 Incontinence Assessment and Management []  - 0 Ostomy Care Assessment and Management (repouching, etc.) PROCESS - Coordination of Care []  - 0 Simple Patient / Family Education for ongoing care X- 1 20 Complex (extensive) Patient / Family Education for ongoing care X- 1 10 Staff obtains Programmer, systems, Records, T Results / Process Orders est X- 1 10 Staff telephones HHA, Nursing Homes / Clarify orders / etc []  - 0 Routine Transfer to another Facility (non-emergent condition) []  - 0 Routine Hospital Admission (non-emergent condition) X- 1 15 New Admissions / Biomedical engineer / Ordering NPWT Apligraf, etc. , []  - 0 Emergency Hospital Admission (emergent condition) PROCESS - Special Needs []  - 0 Pediatric / Minor Patient Management []  - 0 Isolation Patient Management []  - 0 Hearing / Language / Visual special needs []  - 0 Assessment of Community assistance (transportation, D/C planning, etc.) []  - 0 Additional assistance / Altered mentation []  - 0 Support Surface(s)  Assessment (bed, cushion, seat, etc.) INTERVENTIONS - Miscellaneous []  - 0 External ear exam []  - 0 Patient Transfer (multiple staff / Civil Service fast streamer / Similar devices) []  - 0 Simple Staple / Suture removal (25 or less) []  - 0 Complex Staple / Suture removal (26 or more) []  - 0 Hypo/Hyperglycemic Management (do not check if billed separately) []  - 0 Ankle / Brachial Index (ABI) - do not check if billed separately Has  the patient been seen at the hospital within the last three years: Yes Total Score: 110 Level Of Care: New/Established - Level 3 Electronic Signature(s) Signed: 06/12/2020 5:43:17 PM By: [name] RN Entered By: [name] on 06/12/2020 15:49:18 -------------------------------------------------------------------------------- Compression Therapy Details Patient Name: Date of Service: Brian RNER, [name] 06/12/2020 1:15 PM Medical Record Number: [id number] Patient Account Number: [id number] Date of Birth/Sex: Treating RN: [name] (80 y.o. [name] Primary Care [name]: [name] Other Clinician: Referring Chonita Gadea: Treating Jaiah Weigel/Extender: [name] in Treatment: 0 Compression Therapy Performed for Wound Assessment: Wound #1 Right,Medial Malleolus Performed By: Clinician [name], RN Compression Type: Three Layer Post Procedure Diagnosis Same as Pre-procedure Electronic Signature(s) Signed: 06/12/2020 5:43:17 PM By: [name] RN Entered By: [name] on 06/12/2020 15:46:26 -------------------------------------------------------------------------------- Compression Therapy Details Patient Name: Date of Service: [name], [name] 06/12/2020 1:15 PM Medical Record Number: [id number] Patient Account Number: [id number] Date of Birth/Sex: Treating RN: [birth year], Mcdowell (80 y.o. [name] Primary Care Shay Bartoli: [name] Other Clinician: Referring  Altamese Deguire: Treating Tracina Beaumont/Extender: [name] in Treatment: 0 Compression Therapy Performed for Wound Assessment: Wound #2 Right,Posterior Lower Leg Performed By: Clinician [name], RN Compression Type: Three Layer Post Procedure Diagnosis Same as Pre-procedure Electronic Signature(s) Signed: 06/12/2020 5:43:17 PM By: [name] RN Entered By: [name] on 06/12/2020 15:46:26 -------------------------------------------------------------------------------- Compression Therapy Details Patient Name: Date of Service: [name], [name] 06/12/2020 1:15 PM Medical Record Number: [id number] Patient Account Number: [id number] Date of Birth/Sex: Treating RN: [birth year]Mcdowell (80 y.o. [name] Primary Care Davaughn Hillyard: [name] Other Clinician: Referring Aaima Gaddie: Treating Nicoya Friel/Extender: [name] in Treatment: 0 Compression Therapy Performed for Wound Assessment: Wound #3 Right Knee Performed By: Clinician [name], RN Compression Type: Three Layer Post Procedure Diagnosis Same as Pre-procedure Electronic Signature(s) Signed: 06/12/2020 5:43:17 PM By: [name] RN Entered By: [name] on 06/12/2020 15:46:26 -------------------------------------------------------------------------------- Compression Therapy Details Patient Name: Date of Service: [name], [name] 06/12/2020 1:15 PM Medical Record Number: [id number] Patient Account Number: [id number] Date of Birth/Sex: Treating RN: Mcdowell[birth year] (80 y.o. [name] Primary Care Elvi Leventhal: [name] Other Clinician: Referring Ineze Serrao: Treating Ritu Gagliardo/Extender: [name] in Treatment: 0 Compression Therapy Performed for Wound Assessment: Wound #4 Left,Dorsal Foot Performed By: Clinician [name], RN Compression Type: Three Layer Post  Procedure Diagnosis Same as Pre-procedure Electronic Signature(s) Signed: 06/12/2020 5:43:17 PM By: [name] RN Entered By: [name] on 06/12/2020 15:46:26 -------------------------------------------------------------------------------- Compression Therapy Details Patient Name: Date of Service: [name], [name] 06/12/2020 1:15 PM Medical Record Number: [id number] Patient Account Number: [id number] Date of Birth/Sex: Treating RN: [birth year]Mcdowell (80 y.o. [name] Primary Care Balinda Heacock: [name] Other Clinician: Referring Jaydn Moscato: Treating Rashana Andrew/Extender: [name] in Treatment: 0 Compression Therapy Performed for Wound Assessment: Wound #5 Right,Lateral Foot Performed By: Clinician [name], RN Compression Type: Three Layer Post Procedure Diagnosis Same as Pre-procedure Electronic Signature(s) Signed: 06/12/2020 5:43:17 PM By: [name] RN Entered By: [name] on 06/12/2020 15:46:26 -------------------------------------------------------------------------------- Compression Therapy Details Patient Name: Date of Service: [name], [name] 06/12/2020 1:15 PM Medical Record Number: [id number] Patient Account Number: [id number] Date of Birth/Sex: Treating RN: [birth year]Mcdowell (80 y.o. [name] Primary Care Amante Fomby: [name] Other Clinician: Referring Zen Felling: Treating Amirah Goerke/Extender: [name] in Treatment: 0 Compression Therapy Performed for Wound Assessment: Wound #6 Left,Posterior Lower Leg Performed  By: Aris Georgia, RN Compression Type: Three Layer Post Procedure Diagnosis Same as Pre-procedure Electronic Signature(s) Signed: 06/12/2020 5:43:17 PM By: [name] RN Entered By: [name] on 06/12/2020  15:46:26 -------------------------------------------------------------------------------- Compression Therapy Details Patient Name: Date of Service: [name], [name] 06/12/2020 1:15 PM Medical Record Number: [id number] Patient Account Number: [id number] Date of Birth/Sex: Treating RN: [birth year] Mcdowell (80 y.o. [name] Primary Care Shatana Saxton: [name] Other Clinician: Referring Dandy Lazaro: Treating Jayelle Page/Extender: [name] in Treatment: 0 Compression Therapy Performed for Wound Assessment: Wound #7 Left,Proximal,Anterior Lower Leg Performed By: Clinician [name], RN Compression Type: Three Layer Post Procedure Diagnosis Same as Pre-procedure Electronic Signature(s) Signed: 06/12/2020 5:43:17 PM By: [name] RN Entered By: [name] on 06/12/2020 15:46:26 -------------------------------------------------------------------------------- Compression Therapy Details Patient Name: Date of Service: [name], [name] 06/12/2020 1:15 PM Medical Record Number: [id number] Patient Account Number: [id number] Date of Birth/Sex: Treating RN: [birth year] (80 y.o. [name] Primary Care Malania Gawthrop: [name] Other Clinician: Referring Vernis Cabacungan: Treating Natara Monfort/Extender: [name] in Treatment: 0 Compression Therapy Performed for Wound Assessment: Wound #8 Left,Posterior Upper Leg Performed By: Clinician [name], RN Compression Type: Three Layer Post Procedure Diagnosis Same as Pre-procedure Electronic Signature(s) Signed: 06/12/2020 5:43:17 PM By: [name] RN Entered By: [name] on 06/12/2020 15:46:26 -------------------------------------------------------------------------------- Compression Therapy Details Patient Name: Date of Service: [name], [name] 06/12/2020 1:15 PM Medical Record Number: [id number] Patient Account  Number: [id number] Date of Birth/Sex: Treating RN: [birth year]Mcdowell (80 y.o. [name] Primary Care Mclane Arora: [name] Other Clinician: Referring Zaniya Mcaulay: Treating Bunnie Rehberg/Extender: [name] in Treatment: 0 Compression Therapy Performed for Wound Assessment: Wound #9 Left,Proximal,Posterior Upper Leg Performed By: Clinician [name], RN Compression Type: Three Layer Post Procedure Diagnosis Same as Pre-procedure Electronic Signature(s) Signed: 06/12/2020 5:43:17 PM By: [name] RN Entered By: [name] on 06/12/2020 15:46:26 -------------------------------------------------------------------------------- Encounter Discharge Information Details Patient Name: Date of Service: Brian RNER, [name] 06/12/2020 1:15 PM Medical Record Number: [id number] Patient Account Number: [id number] Date of Birth/Sex: Treating RN: Mcdowell[birth year] (80 y.o. [name] Primary Care Otila Starn: [name] Other Clinician: Referring Patte Winkel: Treating Adison Jerger/Extender: [name] in Treatment: 0 Encounter Discharge Information Items Discharge Condition: Stable Ambulatory Status: Wheelchair Discharge Destination: Home Transportation: Private Auto Accompanied By: son Schedule Follow-up Appointment: Yes Clinical Summary of Care: Patient Declined Electronic Signature(s) Signed: 06/12/2020 5:33:10 PM By: Levan Hurst RN, BSN Entered By: Levan Hurst on 06/12/2020 17:06:30 -------------------------------------------------------------------------------- Lower Extremity Assessment Details Patient Name: Date of Service: Brian RNER, [name] 06/12/2020 1:15 PM Medical Record Number: [id number] Patient Account Number: [id number] Date of Birth/Sex: Treating RN: Mcdowell/[birth year] (80 y.o. [name] Primary Care Zarinah Oviatt: [name] Other Clinician: Referring Laurent Cargile: Treating  Devita Nies/Extender: [name] in Treatment: 0 Edema Assessment Assessed: [name]: No] [Right: No] Edema: [Left: Yes] [Right: Yes] Calf Left: Right: Point of Measurement: From Medial Instep 34 cm 30.3 cm Ankle Left: Right: Point of Measurement: From Medial Instep 25.4 cm 22.3 cm Vascular Assessment Pulses: Dorsalis Pedis Palpable: [Left:Yes] [Right:Yes] Electronic Signature(s) Signed: 06/12/2020 5:38:13 PM By: Baruch Gouty RN, BSN Entered By: Baruch Gouty on 06/12/2020 14:26:19 -------------------------------------------------------------------------------- Multi Wound Chart Details Patient Name: Date of Service: [name], [name] 06/12/2020 1:15 PM Medical Record Number: [id number] Patient Account Number: [id number] Date of Birth/Sex: Treating RN: Mcdowell[birth year] (80 y.o. 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] [2:Venous Leg Ulcer] [3:Abrasion] Primary Etiology: [1:Lymphedema] [2:Lymphedema] [3:N/A] 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, Chronic Obstructive] [2:Anemia, Chronic Obstructive] [3:Anemia, Chronic Obstructive] Comorbid History: [1:Pulmonary Disease (COPD), Arrhythmia, Congestive Heart Failure, Arrhythmia, Congestive Heart Failure, Arrhythmia, Congestive Heart Failure, Coronary Artery Disease, Hypertension, Cirrhosis , Osteoarthritis Hypertension, Cirrhosis ,  Osteoarthritis Hypertension, Cirrhosis ,  Osteoarthritis 11/20/2019] [2:Pulmonary Disease (COPD), Coronary Artery Disease, 11/20/2019] [3:Pulmonary Disease (COPD), Coronary Artery Disease, 06/04/2020] Date Acquired: [1:0] [2:0] [3:0] Weeks of Treatment: [1:Open] [2:Open] [3:Open] Wound Status: [1:3.8x4x0.1] [2:1.7x2x0.1] [3:2.3x2.7x0.1] Measurements L x W x D (cm) [1:11.938] [2:2.67] [3:4.877] A (cm) : rea [1:1.194] [2:0.267] [3:0.488] Volume (cm) : [1:Full Thickness Without Exposed] [2:Full Thickness Without Exposed] [3:Full Thickness Without Exposed] Classification: [1:Support Structures Large] [2:Support Structures Medium] [3:Support Structures Small] Exudate Amount: [1:Serous] [2:Serous] [3:Sanguinous] Exudate Type: [1:amber] [2:amber] [3:red] Exudate Color: [1:Flat and Intact] [2:Flat and Intact] [3:Flat and Intact] Wound Margin: [1:Large (67-100%)] [2:Medium (34-66%)] [3:Small (1-33%)] Granulation Amount: [1:Pink] [2:Pink] [3:Red] Granulation Quality: [1:Small (1-33%)] [2:Medium (34-66%)] [3:None Present (0%)] Necrotic Amount: [1:Fat Layer (Subcutaneous Tissue): Yes Fat Layer (Subcutaneous Tissue): Yes Fat Layer (Subcutaneous Tissue): Yes] Exposed Structures: [1:Fascia: No Tendon: No Muscle: No Joint: No Bone: No Small (1-33%)] [2:Fascia: No Tendon: No Muscle: No Joint: No Bone: No None] [3:Fascia: No Tendon: No Muscle: No Joint: No Bone: No Medium (34-66%)] Epithelialization: [1:N/A] [2:N/A] [3:scabbed] Assessment Notes: [1:Compression Therapy] [2:Compression Therapy] [3:Compression [place] Wound Number: [id number]  Photos: No Photos No Photos No Photos Left, Dorsal Foot Right, Lateral Foot Left, Posterior Lower Leg Wound Location: Blister Trauma Gradually Appeared Wounding Event: Lymphedema Lymphedema Lymphedema 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1 1x0.8x0.1 3.3x3.9x0.1 Measurements L x W x D (cm) 1.429 0.628 10.108 A (cm) : rea 0.143 0.063 1.011 Volume (cm) : Full Thickness Without Exposed Full Thickness Without Exposed Full Thickness Without Exposed Classification: Support Structures Support Structures Support Structures Medium Medium Large Exudate Amount: Serous Serosanguineous Serous Exudate Type: amber red, brown amber Exudate Color: Flat and Intact Flat and Intact Flat and Intact Wound Margin: None Present (0%) Large (67-100%) Medium (34-66%) Granulation Amount: N/A Red Red Granulation Quality: Large (67-100%) None Present (0%) Medium (34-66%) Necrotic Amount: Fat Layer (Subcutaneous Tissue): Yes Fat Layer (Subcutaneous Tissue): Yes Fat Layer (Subcutaneous Tissue): Yes Exposed Structures: Fascia: No Fascia: No Fascia: No Tendon: No Tendon: No Tendon: No Muscle: No Muscle: No Muscle: No Joint: No Joint: No Joint: No Bone: No Bone: No Bone: No None Small (1-33%) None Epithelialization: N/A N/A N/A Assessment Notes: Compression Therapy Compression Therapy Compression Therapy Procedures Performed: Wound Number: [id number]  Photos: No Photos No Photos No Photos Left, Proximal, Anterior Lower Leg Left, Posterior Upper Leg Left, Proximal, Posterior Upper Leg Wound Location: Trauma Pressure Injury Pressure Injury Wounding Event: Lymphedema  Pressure Ulcer Pressure Ulcer Primary Etiology: N/A N/A N/A Secondary Etiology: Anemia, Chronic Obstructive Anemia, Chronic  Obstructive Anemia, Chronic Obstructive Comorbid History: Pulmonary Disease (COPD), Pulmonary Disease (COPD), Pulmonary Disease (COPD), Arrhythmia, Congestive Heart Failure, Arrhythmia, Congestive Heart Failure, Arrhythmia, Congestive Heart Failure, Coronary Artery Disease, Coronary Artery Disease, Coronary Artery Disease, Hypertension, Cirrhosis , Osteoarthritis Hypertension, Cirrhosis , Osteoarthritis Hypertension, Cirrhosis , Osteoarthritis 06/04/2020 05/23/2019 05/23/2019 Date Acquired: 0 0 0 Weeks of Treatment: Open Open Open Wound Status: 1.3x1.6x0.1 2.4x1.8x0.1 0.8x0.5x0.1 Measurements L x W x D (cm) 1.634 3.393 0.314 A (cm) : rea 0.163 0.339 0.031 Volume (cm) : Full Thickness Without Exposed Category/Stage II Category/Stage II Classification: Support Structures Medium Small Small Exudate Amount: Serosanguineous Serosanguineous Serosanguineous Exudate Type: red, brown red, brown red, brown Exudate Color: Flat and Intact Flat and Intact Flat and Intact Wound Margin: Large (67-100%) Medium (34-66%) Small (1-33%) Granulation Amount: Red Red Pink Granulation Quality: None Present (0%) Medium (34-66%) Large (67-100%) Necrotic Amount: Fat Layer (Subcutaneous Tissue): Yes Fat Layer (Subcutaneous Tissue): Yes Fat Layer (Subcutaneous Tissue): Yes Exposed Structures: Fascia: No Fascia: No Fascia: No Tendon: No Tendon: No Tendon: No Muscle: No Muscle: No Muscle: No Joint: No Joint: No Joint: No Bone: No Bone: No Bone: No Small (1-33%) None Small (1-33%) Epithelialization: N/A N/A N/A Assessment Notes: Compression Therapy Compression Therapy Compression Therapy Procedures Performed: Treatment Notes Electronic Signature(s) Signed: 06/12/2020 5:22:30 PM By: Linton Ham MD Signed: 06/12/2020 5:43:17 PM By: [name] RN Entered By: Linton Ham on 06/12/2020  16:27:34 -------------------------------------------------------------------------------- Multi-Disciplinary Care Plan Details Patient Name: Date of Service: Brian RNER, [name] 06/12/2020 1:15 PM Medical Record Number: [id number] Patient Account Number: [id number] Date of Birth/Sex: Treating RN: Mcdowell[birth year] (80 y.o. [name], Brian Primary Care Jaquia Benedicto: [name] Other Clinician: Referring Lashaunda Schild: Treating Neiman Roots/Extender: [name] in Treatment: 0 Active Inactive Orientation to the Wound Care Program Nursing Diagnoses: Knowledge deficit related to the wound healing center program Goals: Patient/caregiver will verbalize understanding of the [place] Date Initiated: 06/12/2020 Target Resolution Date: 06/29/2020 Goal Status: Active Interventions: Provide education on orientation to the wound center Notes: Wound/Skin Impairment Nursing Diagnoses: Impaired tissue integrity Knowledge deficit related to ulceration/compromised skin integrity Goals: Patient will have a decrease in wound volume by X% from date: (specify in notes) Date Initiated: 06/12/2020 Target Resolution Date: 06/29/2020 Goal Status: Active Patient/caregiver will verbalize understanding of skin care regimen Date Initiated: 06/12/2020 Target Resolution Date: 06/29/2020 Goal Status: Active Ulcer/skin breakdown will have a volume reduction of 30% by week 4 Date Initiated: 06/12/2020 Target Resolution Date: 06/29/2020 Goal Status: Active Interventions: Assess patient/caregiver ability to obtain necessary supplies Assess patient/caregiver ability to perform ulcer/skin care regimen upon admission and as needed Assess ulceration(s) every visit Notes: Electronic Signature(s) Signed: 06/12/2020 5:43:17 PM By: [name] RN Entered By: [name] on 06/12/2020  15:26:07 -------------------------------------------------------------------------------- Pain Assessment Details Patient Name: Date of Service: Brian RNER, [name] 06/12/2020 1:15 PM Medical Record Number: [id number] Patient Account Number: [id number] Date of Birth/Sex: Treating RN: Oct [birth year], Mcdowell (80 y.o. [name] Primary Care Philomena Buttermore: [name] Other Clinician: Referring Shaynna Husby: Treating Esmeralda Malay/Extender: [name] in Treatment: 0 Active Problems Location of Pain Severity and Description of Pain Patient Has Paino Yes Site Locations Pain Location: Generalized Pain, Pain in Ulcers With Dressing Change: Yes Duration of the Pain. Constant / Intermittento Constant Rate the pain. Current Pain Level: 8 Worst Pain Level: 9 Least Pain Level: 2 Character of Pain Describe the  Pain: Aching, Burning Pain Management and Medication Current Pain Management: Medication: Yes Other: reposition Is the Current Pain Management Adequate: Adequate How does your wound impact your activities of daily livingo Sleep: Yes Bathing: No Appetite: No Relationship With Others: No Bladder Continence: No Emotions: Yes Bowel Continence: No Hobbies: No Toileting: No Dressing: No Electronic Signature(s) Signed: 06/12/2020 5:38:13 PM By: Baruch Gouty RN, BSN Entered By: Baruch Gouty on 06/12/2020 14:49:57 -------------------------------------------------------------------------------- Patient/Caregiver Education Details Patient Name: Date of Service: Brian RNER, [place] 2/22/2022andnbsp1:15 PM Medical Record Number: [id number] Patient Account Number: [id number] Date of Birth/Gender: Treating RN: Mcdowell[birth year] (80 y.o. [name] Primary Care Physician: [name] Other Clinician: Referring Physician: Treating Physician/Extender: [name] in Treatment: 0 Education Assessment Education  Provided To: Patient Education Topics Provided Welcome T [facility] [place]: o Methods: Explain/Verbal Responses: State content correctly Electronic Signature(s) Signed: 06/12/2020 5:43:17 PM By: [name] RN Entered By: [name] on 06/12/2020 15:26:27 -------------------------------------------------------------------------------- Wound Assessment Details Patient Name: Date of Service: Brian RNER, [name] 06/12/2020 1:15 PM Medical Record Number: [id number] Patient Account Number: [id number] Date of Birth/Sex: Treating RN: [birth year]Mcdowell (80 y.o. [name] Primary Care Meiya Wisler: [name] Other Clinician: Referring Vedh Ptacek: Treating Sheletha Bow/Extender: [name] in Treatment: 0 Wound Status Wound Number: 1 Primary Venous Leg Ulcer Etiology: Wound Location: Right, Medial Malleolus Secondary Lymphedema Wounding Event: Gradually Appeared Etiology: Date Acquired: 11/20/2019 Wound Open Weeks Of Treatment: 0 Status: Clustered Wound: No Comorbid Anemia, Chronic Obstructive Pulmonary Disease (COPD), History: Arrhythmia, Congestive Heart Failure, Coronary Artery Disease, Hypertension, Cirrhosis , Osteoarthritis Wound Measurements Length: (cm) 3.8 Width: (cm) 4 Depth: (cm) 0.1 Area: (cm) 11.938 Volume: (cm) 1.194 % Reduction in Area: % Reduction in Volume: Epithelialization: Small (1-33%) Tunneling: No Undermining: No Wound Description Classification: Full Thickness Without Exposed Support Structures Wound Margin: Flat and Intact Exudate Amount: Large Exudate Type: Serous Exudate Color: amber Foul Odor After Cleansing: No Slough/Fibrino Yes Wound Bed Granulation Amount: Large (67-100%) Exposed Structure Granulation Quality: Pink Fascia Exposed: No Necrotic Amount: Small (1-33%) Fat Layer (Subcutaneous Tissue) Exposed: Yes Necrotic Quality: Adherent Slough Tendon Exposed: No Muscle Exposed:  No Joint Exposed: No Bone Exposed: No Treatment Notes Wound #1 (Malleolus) Wound Laterality: Right, Medial Cleanser Soap and Water Discharge Instruction: May shower and wash wound with dial antibacterial soap and water prior to dressing change. Wound Cleanser Discharge Instruction: Cleanse the wound with wound cleanser prior to applying a clean dressing using gauze sponges, not tissue or cotton balls. Peri-Wound Care Topical Primary Dressing KerraCel Ag Gelling Fiber Dressing, 4x5 in (silver alginate) Discharge Instruction: Apply silver alginate to wound bed as instructed Secondary Dressing Woven Gauze Sponge, Non-Sterile 4x4 in Discharge Instruction: Apply over primary dressing as directed. ABD Pad, 5x9 Discharge Instruction: Apply over primary dressing as directed. Secured With Compression Wrap ThreePress (3 layer compression wrap) Discharge Instruction: LIGHTLY apply three layer compression as directed. Compression Stockings Add-Ons Electronic Signature(s) Signed: 06/12/2020 5:38:13 PM By: Baruch Gouty RN, BSN Entered By: Baruch Gouty on 06/12/2020 14:36:36 -------------------------------------------------------------------------------- Wound Assessment Details Patient Name: Date of Service: Brian RNER, [name] 06/12/2020 1:15 PM Medical Record Number: [id number] Patient Account Number: [id number] Date of Birth/Sex: Treating RN: Mcdowell[birth year] (80 y.o. [name] Primary Care Chantae Soo: [name] Other Clinician: Referring Nafis Farnan: Treating Sage Kopera/Extender: [name] in Treatment: 0 Wound Status Wound Number: 2 Primary Venous Leg Ulcer Etiology: Wound Location: Right, Posterior Lower Leg Secondary Lymphedema Wounding Event: Gradually Appeared  Etiology: Date Acquired: 11/20/2019 Wound Open Weeks Of Treatment: 0 Status: Clustered Wound: No Comorbid Anemia, Chronic Obstructive Pulmonary Disease (COPD), History:  Arrhythmia, Congestive Heart Failure, Coronary Artery Disease, Hypertension, Cirrhosis , Osteoarthritis Wound Measurements Length: (cm) 1.7 Width: (cm) 2 Depth: (cm) 0.1 Area: (cm) 2.67 Volume: (cm) 0.267 % Reduction in Area: % Reduction in Volume: Epithelialization: None Tunneling: No Undermining: No Wound Description Classification: Full Thickness Without Exposed Support Structures Wound Margin: Flat and Intact Exudate Amount: Medium Exudate Type: Serous Exudate Color: amber Foul Odor After Cleansing: No Slough/Fibrino Yes Wound Bed Granulation Amount: Medium (34-66%) Exposed Structure Granulation Quality: Pink Fascia Exposed: No Necrotic Amount: Medium (34-66%) Fat Layer (Subcutaneous Tissue) Exposed: Yes Necrotic Quality: Adherent Slough Tendon Exposed: No Muscle Exposed: No Joint Exposed: No Bone Exposed: No Treatment Notes Wound #2 (Lower Leg) Wound Laterality: Right, Posterior Cleanser Soap and Water Discharge Instruction: May shower and wash wound with dial antibacterial soap and water prior to dressing change. Wound Cleanser Discharge Instruction: Cleanse the wound with wound cleanser prior to applying a clean dressing using gauze sponges, not tissue or cotton balls. Peri-Wound Care Topical Primary Dressing KerraCel Ag Gelling Fiber Dressing, 4x5 in (silver alginate) Discharge Instruction: Apply silver alginate to wound bed as instructed Secondary Dressing Secured With Compression Wrap Compression Stockings Add-Ons Electronic Signature(s) Signed: 06/12/2020 5:38:13 PM By: Baruch Gouty RN, BSN Entered By: Baruch Gouty on 06/12/2020 14:38:15 -------------------------------------------------------------------------------- Wound Assessment Details Patient Name: Date of Service: Brian RNER, [name] 06/12/2020 1:15 PM Medical Record Number: [id number] Patient Account Number: [id number] Date of Birth/Sex: Treating RN: [birth year] Mcdowell (80 y.o. [name] Primary Care Arhianna Ebey: [name] Other Clinician: Referring Makayla Confer: Treating Bryer Gottsch/Extender: [name] in Treatment: 0 Wound Status Wound Number: 3 Primary Abrasion Etiology: Wound Location: Right Knee Wound Open Wounding Event: Trauma Status: Date Acquired: 06/04/2020 Comorbid Anemia, Chronic Obstructive Pulmonary Disease (COPD), Weeks Of Treatment: 0 History: Arrhythmia, Congestive Heart Failure, Coronary Artery Disease, Clustered Wound: No Hypertension, Cirrhosis , Osteoarthritis Wound Measurements Length: (cm) 2.3 Width: (cm) 2.7 Depth: (cm) 0.1 Area: (cm) 4.877 Volume: (cm) 0.488 Wound Description Classification: Full Thickness Without Exposed Support Structu Wound Margin: Flat and Intact Exudate Amount: Small Exudate Type: Sanguinous Exudate Color: red Foul Odor After Cleansing: Slough/Fibrino % Reduction in Area: % Reduction in Volume: Epithelialization: Medium (34-66%) Tunneling: No Undermining: No res No No Wound Bed Granulation Amount: Small (1-33%) Exposed Structure Granulation Quality: Red Fascia Exposed: No Necrotic Amount: None Present (0%) Fat Layer (Subcutaneous Tissue) Exposed: Yes Tendon Exposed: No Muscle Exposed: No Joint Exposed: No Bone Exposed: No Assessment Notes scabbed Treatment Notes Wound #3 (Knee) Wound Laterality: Right Cleanser Peri-Wound Care Topical Primary Dressing Secondary Dressing Secured With Compression Wrap Compression Stockings Add-Ons Electronic Signature(s) Signed: 06/12/2020 5:38:13 PM By: Baruch Gouty RN, BSN Entered By: Baruch Gouty on 06/12/2020 14:40:02 -------------------------------------------------------------------------------- Wound Assessment Details Patient Name: Date of Service: Brian RNER, [name] 06/12/2020 1:15 PM Medical Record Number: [id number] Patient Account Number: [id number] Date of Birth/Sex: Treating RN: Mcdowell[birth year] (80  y.o. [name] Primary Care Robinn Overholt: [name] Other Clinician: Referring Abagayle Klutts: Treating Garnell Phenix/Extender: [name] in Treatment: 0 Wound Status Wound Number: 4 Primary Lymphedema Etiology: Wound Location: Left, Dorsal Foot Wound Open Wounding Event: Blister Status: Date Acquired: 11/20/2019 Comorbid Anemia, Chronic Obstructive Pulmonary Disease (COPD), Weeks Of Treatment: 0 History: Arrhythmia, Congestive Heart Failure, Coronary Artery Disease, Clustered Wound: No Hypertension, Cirrhosis , Osteoarthritis Wound Measurements Length: (cm) 1.3 Width: (cm) 1.4  Depth: (cm) 0.1 Area: (cm) 1.429 Volume: (cm) 0.143 Wound Description Classification: Full Thickness Without Exposed Support Structu Wound Margin: Flat and Intact Exudate Amount: Medium Exudate Type: Serous Exudate Color: amber Foul Odor After Cleansing: Slough/Fibrino % Reduction in Area: % Reduction in Volume: Epithelialization: None Tunneling: No Undermining: No res No No Wound Bed Granulation Amount: None Present (0%) Exposed Structure Necrotic Amount: Large (67-100%) Fascia Exposed: No Necrotic Quality: Adherent Slough Fat Layer (Subcutaneous Tissue) Exposed: Yes Tendon Exposed: No Muscle Exposed: No Joint Exposed: No Bone Exposed: No Treatment Notes Wound #4 (Foot) Wound Laterality: Dorsal, Left Cleanser Peri-Wound Care Topical Primary Dressing Secondary Dressing Secured With Compression Wrap Compression Stockings Add-Ons Electronic Signature(s) Signed: 06/12/2020 5:38:13 PM By: Baruch Gouty RN, BSN Entered By: Baruch Gouty on 06/12/2020 14:41:25 -------------------------------------------------------------------------------- Wound Assessment Details Patient Name: Date of Service: Brian RNER, [name] 06/12/2020 1:15 PM Medical Record Number: [id number] Patient Account Number: [id number] Date of Birth/Sex: Treating  RN: Mcdowell[birth year] (80 y.o. [name] Primary Care Almalik Weissberg: [name] Other Clinician: Referring Asante Ritacco: Treating Kayin Kettering/Extender: [name] in Treatment: 0 Wound Status Wound Number: 5 Primary Lymphedema Etiology: Wound Location: Right, Lateral Foot Wound Open Wounding Event: Trauma Status: Date Acquired: 05/28/2020 Comorbid Anemia, Chronic Obstructive Pulmonary Disease (COPD), Weeks Of Treatment: 0 History: Arrhythmia, Congestive Heart Failure, Coronary Artery Disease, Clustered Wound: No Hypertension, Cirrhosis , Osteoarthritis Wound Measurements Length: (cm) 1 Width: (cm) 0.8 Depth: (cm) 0.1 Area: (cm) 0.628 Volume: (cm) 0.063 % Reduction in Area: % Reduction in Volume: Epithelialization: Small (1-33%) Tunneling: No Undermining: No Wound Description Classification: Full Thickness Without Exposed Support Structures Wound Margin: Flat and Intact Exudate Amount: Medium Exudate Type: Serosanguineous Exudate Color: red, brown Foul Odor After Cleansing: No Slough/Fibrino No Wound Bed Granulation Amount: Large (67-100%) Exposed Structure Granulation Quality: Red Fascia Exposed: No Necrotic Amount: None Present (0%) Fat Layer (Subcutaneous Tissue) Exposed: Yes Tendon Exposed: No Muscle Exposed: No Joint Exposed: No Bone Exposed: No Treatment Notes Wound #5 (Foot) Wound Laterality: Right, Lateral Cleanser Peri-Wound Care Topical Primary Dressing Secondary Dressing Secured With Compression Wrap Compression Stockings Add-Ons Electronic Signature(s) Signed: 06/12/2020 5:38:13 PM By: Baruch Gouty RN, BSN Entered By: Baruch Gouty on 06/12/2020 14:43:17 -------------------------------------------------------------------------------- Wound Assessment Details Patient Name: Date of Service: Brian RNER, [name] 06/12/2020 1:15 PM Medical Record Number: [id number] Patient Account Number: [id number] Date of  Birth/Sex: Treating RN: [birth year] Mcdowell (80 y.o. [name] Primary Care Jakelyn Squyres: [name] Other Clinician: Referring Denecia Brunette: Treating Tiauna Whisnant/Extender: [name] in Treatment: 0 Wound Status Wound Number: 6 Primary Lymphedema Etiology: Wound Location: Left, Posterior Lower Leg Secondary Venous Leg Ulcer Wounding Event: Gradually Appeared Etiology: Date Acquired: 11/20/2019 Wound Open Weeks Of Treatment: 0 Status: Clustered Wound: No Comorbid Anemia, Chronic Obstructive Pulmonary Disease (COPD), History: Arrhythmia, Congestive Heart Failure, Coronary Artery Disease, Hypertension, Cirrhosis , Osteoarthritis Wound Measurements Length: (cm) 3.3 Width: (cm) 3.9 Depth: (cm) 0.1 Area: (cm) 10.108 Volume: (cm) 1.011 % Reduction in Area: % Reduction in Volume: Epithelialization: None Tunneling: No Undermining: No Wound Description Classification: Full Thickness Without Exposed Support Structures Wound Margin: Flat and Intact Exudate Amount: Large Exudate Type: Serous Exudate Color: amber Foul Odor After Cleansing: No Slough/Fibrino Yes Wound Bed Granulation Amount: Medium (34-66%) Exposed Structure Granulation Quality: Red Fascia Exposed: No Necrotic Amount: Medium (34-66%) Fat Layer (Subcutaneous Tissue) Exposed: Yes Necrotic Quality: Adherent Slough Tendon Exposed: No Muscle Exposed: No Joint Exposed: No Bone Exposed: No Treatment Notes Wound #6 (Lower Leg) Wound Laterality:  Left, Posterior Cleanser Peri-Wound Care Topical Primary Dressing Secondary Dressing Secured With Compression Wrap Compression Stockings Add-Ons Electronic Signature(s) Signed: 06/12/2020 5:38:13 PM By: Baruch Gouty RN, BSN Entered By: Baruch Gouty on 06/12/2020 14:44:43 -------------------------------------------------------------------------------- Wound Assessment Details Patient Name: Date of Service: Brian RNER, [name]  06/12/2020 1:15 PM Medical Record Number: [id number] Patient Account Number: [id number] Date of Birth/Sex: Treating RN: [birth year] (80 y.o. [name] Primary Care Haroon Shatto: [name] Other Clinician: Referring Troy Kanouse: Treating Jahson Emanuele/Extender: [name] in Treatment: 0 Wound Status Wound Number: 7 Primary Lymphedema Etiology: Wound Location: Left, Proximal, Anterior Lower Leg Wound Open Wounding Event: Trauma Status: Date Acquired: 06/04/2020 Comorbid Anemia, Chronic Obstructive Pulmonary Disease (COPD), Weeks Of Treatment: 0 History: Arrhythmia, Congestive Heart Failure, Coronary Artery Disease, Clustered Wound: No Hypertension, Cirrhosis , Osteoarthritis Wound Measurements Length: (cm) 1.3 Width: (cm) 1.6 Depth: (cm) 0.1 Area: (cm) 1.634 Volume: (cm) 0.163 % Reduction in Area: % Reduction in Volume: Epithelialization: Small (1-33%) Tunneling: No Undermining: No Wound Description Classification: Full Thickness Without Exposed Support Structures Wound Margin: Flat and Intact Exudate Amount: Medium Exudate Type: Serosanguineous Exudate Color: red, brown Foul Odor After Cleansing: No Slough/Fibrino No Wound Bed Granulation Amount: Large (67-100%) Exposed Structure Granulation Quality: Red Fascia Exposed: No Necrotic Amount: None Present (0%) Fat Layer (Subcutaneous Tissue) Exposed: Yes Tendon Exposed: No Muscle Exposed: No Joint Exposed: No Bone Exposed: No Treatment Notes Wound #7 (Lower Leg) Wound Laterality: Left, Anterior, Proximal Cleanser Peri-Wound Care Topical Primary Dressing Secondary Dressing Secured With Compression Wrap Compression Stockings Add-Ons Electronic Signature(s) Signed: 06/12/2020 5:38:13 PM By: Baruch Gouty RN, BSN Entered By: Baruch Gouty on 06/12/2020 14:45:57 -------------------------------------------------------------------------------- Wound Assessment  Details Patient Name: Date of Service: Brian RNER, [name] 06/12/2020 1:15 PM Medical Record Number: [id number] Patient Account Number: [id number] Date of Birth/Sex: Treating RN: Mcdowell[birth year] (80 y.o. [name] Primary Care Niccole Witthuhn: [name] Other Clinician: Referring Ethleen Lormand: Treating Blaike Newburn/Extender: [name] in Treatment: 0 Wound Status Wound Number: 8 Primary Pressure Ulcer Etiology: Wound Location: Left, Posterior Upper Leg Wound Open Wounding Event: Pressure Injury Status: Date Acquired: 05/23/2019 Comorbid Anemia, Chronic Obstructive Pulmonary Disease (COPD), Weeks Of Treatment: 0 History: Arrhythmia, Congestive Heart Failure, Coronary Artery Disease, Clustered Wound: No Hypertension, Cirrhosis , Osteoarthritis Wound Measurements Length: (cm) 2.4 Width: (cm) 1.8 Depth: (cm) 0.1 Area: (cm) 3.393 Volume: (cm) 0.339 % Reduction in Area: % Reduction in Volume: Epithelialization: None Tunneling: No Undermining: No Wound Description Classification: Category/Stage II Wound Margin: Flat and Intact Exudate Amount: Small Exudate Type: Serosanguineous Exudate Color: red, brown Wound Bed Granulation Amount: Medium (34-66%) Granulation Quality: Red Necrotic Amount: Medium (34-66%) Necrotic Quality: Adherent Slough Foul Odor After Cleansing: No Slough/Fibrino Yes Exposed Structure Fascia Exposed: No Fat Layer (Subcutaneous Tissue) Exposed: Yes Tendon Exposed: No Muscle Exposed: No Joint Exposed: No Bone Exposed: No Treatment Notes Wound #8 (Upper Leg) Wound Laterality: Left, Posterior Cleanser Normal Saline Discharge Instruction: Cleanse the wound with Normal Saline prior to applying a clean dressing using gauze sponges, not tissue or cotton balls. Wound Cleanser Discharge Instruction: Cleanse the wound with wound cleanser prior to applying a clean dressing using gauze sponges, not tissue or cotton  balls. Peri-Wound Care Topical Primary Dressing KerraCel Ag Gelling Fiber Dressing, 4x5 in (silver alginate) Discharge Instruction: Apply silver alginate to wound bed as instructed Secondary Dressing Woven Gauze Sponge, Non-Sterile 4x4 in Discharge Instruction: Apply over primary dressing as directed. ComfortFoam Border, 4x4 in (silicone border) Discharge Instruction: Apply over  primary dressing as directed. Secured With Compression Wrap Compression Stockings Environmental education officer) Signed: 06/12/2020 5:38:13 PM By: Baruch Gouty RN, BSN Entered By: Baruch Gouty on 06/12/2020 14:47:35 -------------------------------------------------------------------------------- Wound Assessment Details Patient Name: Date of Service: Brian RNER, [name] 06/12/2020 1:15 PM Medical Record Number: [id number] Patient Account Number: [id number] Date of Birth/Sex: Treating RN: [birth year]-Mcdowell (80 y.o. [name] Primary Care Neha Waight: [name] Other Clinician: Referring Koichi Platte: Treating Ignazio Kincaid/Extender: [name] in Treatment: 0 Wound Status Wound Number: 9 Primary Pressure Ulcer Etiology: Wound Location: Left, Proximal, Posterior Upper Leg Wound Open Wounding Event: Pressure Injury Status: Date Acquired: 05/23/2019 Comorbid Anemia, Chronic Obstructive Pulmonary Disease (COPD), Weeks Of Treatment: 0 History: Arrhythmia, Congestive Heart Failure, Coronary Artery Disease, Clustered Wound: No Hypertension, Cirrhosis , Osteoarthritis Wound Measurements Length: (cm) 0.8 Width: (cm) 0.5 Depth: (cm) 0.1 Area: (cm) 0.314 Volume: (cm) 0.031 % Reduction in Area: % Reduction in Volume: Epithelialization: Small (1-33%) Tunneling: No Undermining: No Wound Description Classification: Category/Stage II Wound Margin: Flat and Intact Exudate Amount: Small Exudate Type: Serosanguineous Exudate Color: red, brown Foul Odor After  Cleansing: No Slough/Fibrino Yes Wound Bed Granulation Amount: Small (1-33%) Exposed Structure Granulation Quality: Pink Fascia Exposed: No Necrotic Amount: Large (67-100%) Fat Layer (Subcutaneous Tissue) Exposed: Yes Necrotic Quality: Adherent Slough Tendon Exposed: No Muscle Exposed: No Joint Exposed: No Bone Exposed: No Treatment Notes Wound #9 (Upper Leg) Wound Laterality: Left, Posterior, Proximal Cleanser Normal Saline Discharge Instruction: Cleanse the wound with Normal Saline prior to applying a clean dressing using gauze sponges, not tissue or cotton balls. Wound Cleanser Discharge Instruction: Cleanse the wound with wound cleanser prior to applying a clean dressing using gauze sponges, not tissue or cotton balls. Peri-Wound Care Topical Primary Dressing KerraCel Ag Gelling Fiber Dressing, 4x5 in (silver alginate) Discharge Instruction: Apply silver alginate to wound bed as instructed Secondary Dressing Woven Gauze Sponge, Non-Sterile 4x4 in Discharge Instruction: Apply over primary dressing as directed. ComfortFoam Border, 4x4 in (silicone border) Discharge Instruction: Apply over primary dressing as directed. Secured With Compression Wrap Compression Stockings Environmental education officer) Signed: 06/12/2020 5:38:13 PM By: Baruch Gouty RN, BSN Entered By: Baruch Gouty on 06/12/2020 14:48:48 -------------------------------------------------------------------------------- Vitals Details Patient Name: Date of Service: Brian RNER, [name] 06/12/2020 1:15 PM Medical Record Number: [id number] Patient Account Number: [id number] Date of Birth/Sex: Treating RN: [birth year] (80 y.o. [name] Primary Care Devrin Monforte: [name] Other Clinician: Referring Avo Schlachter: Treating Jaydah Stahle/Extender: [name] in Treatment: 0 Vital Signs Time Taken: 14:00 Temperature (F): 97.7 Height (in): 71 Pulse (bpm):  63 Source: Stated Respiratory Rate (breaths/min): 18 Weight (lbs): 178 Blood Pressure (mmHg): 136/46 Source: Stated Reference Range: 80 - 120 mg / dl Body Mass Index (BMI): 24.8 Electronic Signature(s) Signed: 06/12/2020 5:38:13 PM By: Baruch Gouty RN, BSN Entered By: Baruch Gouty on 06/12/2020 14:03:47

## 2020-06-12 NOTE — Progress Notes (Signed)
[name] ([id number]) Visit Report for 06/12/2020 Abuse/Suicide Risk Screen Details Patient Name: Date of Service: [name] 06/12/2020 1:15 PM Medical Record Number: [id number] Patient Account Number: [id number] Date of Birth/Sex: Treating RN: 1940/05/24 (80 y.o. [name] Primary Care Provider: Pricilla Mcdowell Other Clinician: Referring Provider: Treating Provider/Extender: [name] in Treatment: 0 Abuse/Suicide Risk Screen Items Answer ABUSE RISK SCREEN: Has anyone close to you tried to hurt or harm you recentlyo No Do you feel uncomfortable with anyone in your familyo No Has anyone forced you do things that you didnt want to doo No Electronic Signature(s) Signed: 06/12/2020 5:38:13 PM By: Brian Gouty RN, BSN Entered By: [name] on 06/12/2020 14:20:44 -------------------------------------------------------------------------------- Activities of Daily Living Details Patient Name: Date of Service: [name], Brian Brian E. 06/12/2020 1:15 PM Medical Record Number: [id number] Patient Account Number: [id number] Date of Birth/Sex: Treating RN: [birth year] (80 y.o. [name] Primary Care Provider: Pricilla Mcdowell Other Clinician: Referring Provider: Treating Provider/Extender: [name] in Treatment: 0 Activities of Daily Living Items Answer Activities of Daily Living (Please select one for each item) Drive Automobile Not Able T Medications ake Completely Able Use T elephone Completely Able Care for Appearance Need Assistance Use T oilet Need Assistance Bath / Shower Need Assistance Dress Self Need Assistance Feed Self Completely Able Walk Not Able Get In / Out Bed Need Assistance Housework Not Able Prepare Meals Need Assistance Handle Money Completely Able Shop for Self Not Able Electronic Signature(s) Signed: 06/12/2020 5:38:13 PM By: Brian Gouty RN,  BSN Entered By: [name] on 06/12/2020 14:21:25 -------------------------------------------------------------------------------- Education Screening Details Patient Name: Date of Service: [name], Brian Brian E. 06/12/2020 1:15 PM Medical Record Number: [id number] Patient Account Number: [id number] Date of Birth/Sex: Treating RN: 1940/11/30 (80 y.o. [name] Primary Care Provider: Pricilla Mcdowell Other Clinician: Referring Provider: Treating Provider/Extender: [name] in Treatment: 0 Primary Learner Assessed: Patient Learning Preferences/Education Level/Primary Language Learning Preference: Explanation, Demonstration, Printed Material Highest Education Level: College or Above Preferred Language: English Cognitive Barrier Language Barrier: No Translator Needed: No Memory Deficit: No Emotional Barrier: No Cultural/Religious Beliefs Affecting Medical Care: No Physical Barrier Impaired Vision: Yes Glasses, reading Impaired Hearing: No Decreased Hand dexterity: No Knowledge/Comprehension Knowledge Level: High Comprehension Level: High Ability to understand written instructions: High Ability to understand verbal instructions: High Motivation Anxiety Level: Calm Cooperation: Cooperative Education Importance: Acknowledges Need Interest in Health Problems: Asks Questions Perception: Coherent Willingness to Engage in Self-Management High Activities: Readiness to Engage in Self-Management High Activities: Electronic Signature(s) Signed: 06/12/2020 5:38:13 PM By: Brian Gouty RN, BSN Entered By: [name] on 06/12/2020 14:21:59 -------------------------------------------------------------------------------- Fall Risk Assessment Details Patient Name: Date of Service: [name], Brian Brian E. 06/12/2020 1:15 PM Medical Record Number: [id number] Patient Account Number: [id number] Date of Birth/Sex: Treating RN: 1940/12/16  (80 y.o. [name] Primary Care Provider: Pricilla Mcdowell Other Clinician: Referring Provider: Treating Provider/Extender: [name] in Treatment: 0 Fall Risk Assessment Items Have you had 2 or more falls in the last 12 monthso 0 No Have you had any fall that resulted in injury in the last 12 monthso 0 No FALLS RISK SCREEN History of falling - immediate or within 3 months 0 No Secondary diagnosis (Do you have 2 or more medical diagnoseso) 15 Yes Ambulatory aid None/bed rest/wheelchair/nurse 0 Yes Crutches/cane/walker 0 No Furniture 0 No Intravenous therapy Access/Saline/Heparin Lock 0 No Gait/Transferring Normal/ bed rest/  wheelchair 0 No Weak (short steps with or without shuffle, stooped but able to lift head while walking, may seek 0 No support from furniture) Impaired (short steps with shuffle, may have difficulty arising from chair, head down, impaired 20 Yes balance) Mental Status Oriented to own ability 0 Yes Electronic Signature(s) Signed: 06/12/2020 5:38:13 PM By: Brian Gouty RN, BSN Entered By: [name] on 06/12/2020 14:22:29 -------------------------------------------------------------------------------- Foot Assessment Details Patient Name: Date of Service: [name], Brian Brian E. 06/12/2020 1:15 PM Medical Record Number: [id number] Patient Account Number: [id number] Date of Birth/Sex: Treating RN: May 01, 1940 (80 y.o. [name] Primary Care Provider: Pricilla Mcdowell Other Clinician: Referring Provider: Treating Provider/Extender: [name] in Treatment: 0 Foot Assessment Items Site Locations + = Sensation present, - = Sensation absent, C = Callus, U = Ulcer R = Redness, W = Warmth, M = Maceration, PU = Pre-ulcerative lesion F = Fissure, S = Swelling, D = Dryness Assessment Right: Left: Other Deformity: No No Prior Foot Ulcer: No No Prior Amputation: No  No Charcot Joint: No No Ambulatory Status: Non-ambulatory Assistance Device: Wheelchair Gait: Engineer, maintenance) Signed: 06/12/2020 5:38:13 PM By: Brian Gouty RN, BSN Entered By: [name] on 06/12/2020 14:23:50 -------------------------------------------------------------------------------- Nutrition Risk Screening Details Patient Name: Date of Service: [name], Brian Brian E. 06/12/2020 1:15 PM Medical Record Number: [id number] Patient Account Number: [id number] Date of Birth/Sex: Treating RN: 08-01-1940 (80 y.o. [name] Primary Care Provider: Pricilla Mcdowell Other Clinician: Referring Provider: Treating Provider/Extender: [name] in Treatment: 0 Height (in): 71 Weight (lbs): 178 Body Mass Index (BMI): 24.8 Nutrition Risk Screening Items Score Screening NUTRITION RISK SCREEN: I have an illness or condition that made me change the kind and/or amount of food I eat 0 No I eat fewer than two meals per day 0 No I eat few fruits and vegetables, or milk products 0 No I have three or more drinks of beer, liquor or wine almost every day 0 No I have tooth or mouth problems that make it hard for me to eat 2 Yes I don't always have enough money to buy the food I need 0 No I eat alone most of the time 0 No I take three or more different prescribed or over-the-counter drugs a day 1 Yes Without wanting to, I have lost or gained 10 pounds in the last six months 0 No I am not always physically able to shop, cook and/or feed myself 2 Yes Nutrition Protocols Good Risk Protocol Provide education on elevated blood Moderate Risk Protocol 0 sugars and impact on wound healing, as applicable High Risk Proctocol Risk Level: Moderate Risk Score: 5 Electronic Signature(s) Signed: 06/12/2020 5:38:13 PM By: Brian Gouty RN, BSN Entered By: [name] on 06/12/2020 14:23:16

## 2020-06-12 NOTE — Progress Notes (Signed)
[name], MAHMOUD BLAZEJEWSKI ([id number]) Visit Report for 06/12/2020 Chief Complaint Document Details Patient Name: Date of Service: [name] 06/12/2020 1:15 PM Medical Record Number: [id number] Patient Account Number: [id number] Date of Birth/Sex: Treating RN: 05/23/1940 (80 y.o. [name], [name] Primary Care Provider: [name] Other Clinician: Referring Provider: Treating Provider/Extender: [name] in Treatment: 0 Information Obtained from: Patient Chief Complaint 06/12/2020; patient is here for review of wounds on his bilateral lower legs and feet Electronic Signature(s) Signed: 06/12/2020 5:22:30 PM By: Linton Ham MD Entered By: Linton Ham on 06/12/2020 16:28:03 -------------------------------------------------------------------------------- HPI Details Patient Name: Date of Service: Brian Chiquito, [name] 06/12/2020 1:15 PM Medical Record Number: [id number] Patient Account Number: [id number] Date of Birth/Sex: Treating RN: 1940/11/16 (80 y.o. [name], [name] Primary Care Provider: [name] Other Clinician: Referring Provider: Treating Provider/Extender: [name] in Treatment: 0 History of Present Illness HPI Description: ADMISSION 06/12/2020 This is a 80 year old man who has had wounds on his bilateral lower legs and feet for several months now. Looking in epic he saw his primary physician and early November at which time he had an ulcer of the right calf. He was seen again by his primary doctor on 05/12/2020 at which time he had bilateral lower extremity ulcers. He was reviewed by Dr. Trula Slade of vein and vascular in the spring 2021. He had arterial studies done on 08/12/2019 showing an ABI on the right and 0.74 on the left and 0.69. TBI of the right of 0.29 on the left 0.35 with monophasic waveforms bilaterally. He also had venous reflux studies on 09/29/2019 that did not show  evidence of DVT or SVT bilaterally. In the right he had superficial vein reflux in the SSV and mid thigh AASV greater saphenous vein had previously been harvested. On the left he did not have any superficial vein reflux but it was noted that he had interstitial fluid without throughout the lower extremity. He also has a history of CABG, congestive heart failure followed by Dr. Stanford Breed and apparently liver cirrhosis. He takes Lasix 80 mg as well as spironolactone. He arrives in clinic today with 9 wounds. Most of these are superficial and look like they started with blisters. He has areas on the left dorsal foot x2, left posterior calf left anterior tibial left posterior thigh x2 the right medial lower extremity and the right medial malleolus. He has pitting edema extending well up into his thighs skin changes look like chronic stasis dermatitis. Dr. Trula Slade saw him in May noted that he had tolerated Unna boots and was feeling better. Also noted the moderate arterial insufficiency and suggested if he developed nonhealing wounds then he may need attempted angiography. Past medical history extensive including multiple left hip surgeries, congestive heart failure, cirrhosis, peripheral arterial disease, chronic atrial fibrillation, recurrent cellulitis of the legs, AAA, carotid stenosis, COPD, hypertension and hyperlipidemia We did not attempt his arterial ABIs in our clinic today because of swelling Electronic Signature(s) Signed: 06/12/2020 5:22:30 PM By: Linton Ham MD Entered By: Linton Ham on 06/12/2020 16:56:49 -------------------------------------------------------------------------------- Physical Exam Details Patient Name: Date of Service: Brian Chiquito, [name] 06/12/2020 1:15 PM Medical Record Number: [id number] Patient Account Number: [id number] Date of Birth/Sex: Treating RN: 1940/08/30 (80 y.o. [name] Primary Care Provider: [name] Other  Clinician: Referring Provider: Treating Provider/Extender: [name] in Treatment: 0 Constitutional Sitting or standing Blood Pressure is within target range for patient.. Pulse regular and  within target range for patient.[name] Respirations regular, non-labored and within target range.. Temperature is normal and within the target range for the patient.[name] Appears in no distress. Respiratory work of breathing is normal. Surprisingly clear. Cardiovascular 2 out of 6 systolic ejection murmur maximal at the aortic area. His jugular venous pressure was visible while sitting. 2-3+ pitting edema coccyx. Pedal pulses absent bilaterally.. 3+ pitting edema well up into his thighs. Gastrointestinal (GI) Abdomen is distended however I was not convinced there was shifting dullness. Liver and spleen not palpable. Integumentary (Hair, Skin) Skin changes that look like stasis dermatitis bilaterally. Notes Wound exam; as noted he had 9 documented wounds in our clinic. All of these were superficial minimal depth. On the left this included the left posterior thigh x2 these had debris on the surface although I did not attempt debridement today, left anterior tibia, left posterior calf left dorsal foot x2 on the right right medial lower extremity and right medial malleolus. All of these except for the left thigh were in roughly the same condition. The one on the thigh had adherent debris that did not wash off. Electronic Signature(s) Signed: 06/12/2020 5:22:30 PM By: Linton Ham MD Entered By: Linton Ham on 06/12/2020 16:59:19 -------------------------------------------------------------------------------- Physician Orders Details Patient Name: Date of Service: [name], [name] 06/12/2020 1:15 PM Medical Record Number: [id number] Patient Account Number: [id number] Date of Birth/Sex: Treating RN: 1940-08-25 (80 y.o. [name] Primary Care Provider: [name] Other Clinician: Referring Provider: Treating Provider/Extender: [name] in Treatment: 0 Verbal / Phone Orders: No Diagnosis Coding Follow-up Appointments Return Appointment in 1 week. Nurse Visit: - on Friday Bathing/ Shower/ Hygiene May shower with protection but do not get wound dressing(s) wet. - May use cast protectors over wraps. You can find these at [facility] or [facility] Edema Control - Lymphedema / SCD / Other Elevate legs to the level of the heart or above for 30 minutes daily and/or when sitting, a frequency of: Avoid standing for long periods of time. Home Health dmit to [facility] for wound care. May utilize formulary equivalent dressing for wound treatment orders unless otherwise specified. - A [facility] Wound Treatment Wound #1 - Malleolus Wound Laterality: Right, Medial Cleanser: Soap and Water 2 x Per Week/15 Days Discharge Instructions: May shower and wash wound with dial antibacterial soap and water prior to dressing change. Cleanser: Wound Cleanser 2 x Per Week/15 Days Discharge Instructions: Cleanse the wound with wound cleanser prior to applying a clean dressing using gauze sponges, not tissue or cotton balls. Prim Dressing: KerraCel Ag Gelling Fiber Dressing, 4x5 in (silver alginate) 2 x Per Week/15 Days ary Discharge Instructions: Apply silver alginate to wound bed as instructed Secondary Dressing: Woven Gauze Sponge, Non-Sterile 4x4 in 2 x Per Week/15 Days Discharge Instructions: Apply over primary dressing as directed. Secondary Dressing: ABD Pad, 5x9 2 x Per Week/15 Days Discharge Instructions: Apply over primary dressing as directed. Compression Wrap: ThreePress (3 layer compression wrap) 2 x Per Week/15 Days Discharge Instructions: LIGHTLY apply three layer compression as directed. Wound #2 - Lower Leg Wound Laterality: Right, Posterior Cleanser: Soap and Water 2 x Per Week/15 Days Discharge Instructions: May  shower and wash wound with dial antibacterial soap and water prior to dressing change. Cleanser: Wound Cleanser 2 x Per Week/15 Days Discharge Instructions: Cleanse the wound with wound cleanser prior to applying a clean dressing using gauze sponges, not tissue or cotton balls. Prim Dressing: KerraCel Ag Gelling Fiber Dressing, 4x5  in (silver alginate) 2 x Per Week/15 Days ary Discharge Instructions: Apply silver alginate to wound bed as instructed Wound #8 - Upper Leg Wound Laterality: Left, Posterior Cleanser: Normal Saline (DME) (Generic) Every Other Day/15 Days Discharge Instructions: Cleanse the wound with Normal Saline prior to applying a clean dressing using gauze sponges, not tissue or cotton balls. Cleanser: Wound Cleanser Every Other Day/15 Days Discharge Instructions: Cleanse the wound with wound cleanser prior to applying a clean dressing using gauze sponges, not tissue or cotton balls. Prim Dressing: KerraCel Ag Gelling Fiber Dressing, 4x5 in (silver alginate) (DME) (Generic) Every Other Day/15 Days ary Discharge Instructions: Apply silver alginate to wound bed as instructed Secondary Dressing: Woven Gauze Sponge, Non-Sterile 4x4 in (DME) (Generic) Every Other Day/15 Days Discharge Instructions: Apply over primary dressing as directed. Secondary Dressing: ComfortFoam Border, 4x4 in (silicone border) (DME) (Generic) Every Other Day/15 Days Discharge Instructions: Apply over primary dressing as directed. Wound #9 - Upper Leg Wound Laterality: Left, Posterior, Proximal Cleanser: Normal Saline (DME) (Generic) Every Other Day/15 Days Discharge Instructions: Cleanse the wound with Normal Saline prior to applying a clean dressing using gauze sponges, not tissue or cotton balls. Cleanser: Wound Cleanser Every Other Day/15 Days Discharge Instructions: Cleanse the wound with wound cleanser prior to applying a clean dressing using gauze sponges, not tissue or cotton balls. Prim Dressing:  KerraCel Ag Gelling Fiber Dressing, 4x5 in (silver alginate) (DME) (Generic) Every Other Day/15 Days ary Discharge Instructions: Apply silver alginate to wound bed as instructed Secondary Dressing: Woven Gauze Sponge, Non-Sterile 4x4 in (DME) (Generic) Every Other Day/15 Days Discharge Instructions: Apply over primary dressing as directed. Secondary Dressing: ComfortFoam Border, 4x4 in (silicone border) (DME) (Generic) Every Other Day/15 Days Discharge Instructions: Apply over primary dressing as directed. Electronic Signature(s) Signed: 06/12/2020 5:22:30 PM By: Linton Ham MD Signed: 06/12/2020 5:43:17 PM By: [name] RN Entered By: [name] on 06/12/2020 15:53:59 -------------------------------------------------------------------------------- Problem List Details Patient Name: Date of Service: [name], [name] 06/12/2020 1:15 PM Medical Record Number: [id number] Patient Account Number: [id number] Date of Birth/Sex: Treating RN: Apr 26, 1940 (80 y.o. 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 06/12/2020 1:15 PM Medical Record Number: [id number] Patient Account Number: [id number] Date of Birth/Sex: Treating RN: Feb 13, 1941 (80 y.o. [name], [name] Primary Care Provider: [name] Other Clinician: Referring Provider: Treating Provider/Extender: [name] in Treatment: 0 Subjective Chief Complaint Information obtained from Patient 06/12/2020; patient is here for review of wounds on his bilateral lower legs and feet History of Present Illness (HPI) ADMISSION 06/12/2020 This is a 80 year old man who has had wounds  on his bilateral lower legs and feet for several months now. Looking in epic he saw his primary physician and early November at which time he had an ulcer of the right calf. He was seen again by his primary doctor on 05/12/2020 at which time he had bilateral lower extremity ulcers. He was reviewed by Dr. Trula Slade of vein and vascular in the spring 2021. He had arterial studies done on 08/12/2019 showing an ABI on the right and 0.74 on the left and 0.69. TBI of the right of 0.29 on the left 0.35 with monophasic waveforms bilaterally. He also had venous reflux studies on 09/29/2019 that did not show evidence of DVT or SVT bilaterally. In the right he had superficial vein reflux in the SSV and mid thigh AASV greater saphenous vein had previously been harvested. On the left he did not have any superficial vein reflux but it was noted that he had interstitial fluid without throughout the lower extremity. He also has a history of CABG, congestive heart failure followed by Dr. Stanford Breed and apparently liver cirrhosis. He takes Lasix 80  mg as well as spironolactone. He arrives in clinic today with 9 wounds. Most of these are superficial and look like they started with blisters. He has areas on the left dorsal foot x2, left posterior calf left anterior tibial left posterior thigh x2 the right medial lower extremity and the right medial malleolus. He has pitting edema extending well up into his thighs skin changes look like chronic stasis dermatitis. Dr. Trula Slade saw him in May noted that he had tolerated Unna boots and was feeling better. Also noted the moderate arterial insufficiency and suggested if he developed nonhealing wounds then he may need attempted angiography. Past medical history extensive including multiple left hip surgeries, congestive heart failure, cirrhosis, peripheral arterial disease, chronic atrial fibrillation, recurrent cellulitis of the legs, AAA, carotid stenosis, COPD, hypertension and hyperlipidemia We did not attempt his arterial ABIs in our clinic today because of swelling Patient History Information obtained from Patient. Allergies gabapentin (Reaction: muscle weakness), doxycycline, amlodipine (Reaction: nausea/vomiting), Fish Containing Products (Reaction: nausea/vomiting), hydrocodone (Reaction: itching/rash), acetaminophen (Reaction: liver problems) Family History Cancer - Siblings, Heart Disease - Father, Hypertension - Father, No family history of Diabetes, Hereditary Spherocytosis, Kidney Disease, Lung Disease, Seizures, Stroke, Thyroid Problems, Tuberculosis. Social History Former smoker - quit 2011, Marital Status - Widowed, Alcohol Use - Never, Drug Use - No History, Caffeine Use - Daily - soda. 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 - TURP. - left hip athroplasty. - left hip closed reductionx2. - total left hip revision. Medical A Surgical History Notes nd Constitutional Symptoms (General Health) chronic pain Cardiovascular carotid stenosis, hyperlipidemia, AAA Genitourinary CKD stage 3, BPH Neurologic cerebrovascular disease, cerebral palsy Review of Systems (ROS) Constitutional Symptoms (General Health) Denies complaints or symptoms of Fatigue, Fever, Chills, Marked Weight Change. Eyes Complains or has symptoms of Glasses /  Contacts - reading. Denies complaints or symptoms of Dry Eyes, Vision Changes. Ear/Nose/Mouth/Throat Denies complaints or symptoms of Chronic sinus problems or rhinitis. Respiratory Complains or has symptoms of Shortness of Breath. Denies complaints or symptoms of Chronic or frequent coughs. Cardiovascular Denies complaints or symptoms of Chest pain. Gastrointestinal Denies complaints or symptoms of Frequent diarrhea, Nausea, Vomiting. Endocrine Denies complaints or symptoms of Heat/cold intolerance. Genitourinary Denies complaints or symptoms of Frequent urination. Integumentary (Skin) Complains or has symptoms of Wounds - bil lower legs. Musculoskeletal Complains or has symptoms of Muscle Pain, Muscle Weakness. Neurologic Denies complaints or symptoms of Numbness/parasthesias. Psychiatric Denies complaints or symptoms of  Claustrophobia, Suicidal. Objective Constitutional Sitting or standing Blood Pressure is within target range for patient.. Pulse regular and within target range for patient.[name] Respirations regular, non-labored and within target range.. Temperature is normal and within the target range for the patient.[name] Appears in no distress. Vitals Time Taken: 2:00 PM, Height: 71 in, Source: Stated, Weight: 178 lbs, Source: Stated, BMI: 24.8, Temperature: 97.7 F, Pulse: 63 bpm, Respiratory Rate: 18 breaths/min, Blood Pressure: 136/46 mmHg. Respiratory work of breathing is normal. Surprisingly clear. Cardiovascular 2 out of 6 systolic ejection murmur maximal at the aortic area. His jugular venous pressure was visible while sitting. 2-3+ pitting edema coccyx. Pedal pulses absent bilaterally.. 3+ pitting edema well up into his thighs. Gastrointestinal (GI) Abdomen is distended however I was not convinced there was shifting dullness. Liver and spleen not palpable. General Notes: Wound exam; as noted he had 9 documented wounds in our clinic. All of these were superficial minimal depth. On the left this included the left posterior thigh x2 these had debris on the surface although I did not attempt debridement today, left anterior tibia, left posterior calf left dorsal foot x2 on the right right medial lower extremity and right medial malleolus. All of these except for the left thigh were in roughly the same condition. The one on the thigh had adherent debris that did not wash off. Integumentary (Hair, Skin) Skin changes that look like stasis dermatitis bilaterally. Wound #1 status is Open. Original cause of wound was Gradually Appeared. The date acquired was: 11/20/2019. The wound is located on the Right,Medial Malleolus. The wound measures 3.8cm length x 4cm width x 0.1cm depth; 11.938cm^2 area and 1.194cm^3 volume. There is Fat Layer (Subcutaneous Tissue) exposed. There is no tunneling or undermining noted. There  is a large amount of serous drainage noted. The wound margin is flat and intact. There is large (67- 100%) pink granulation within the wound bed. There is a small (1-33%) amount of necrotic tissue within the wound bed including Adherent Slough. Wound #2 status is Open. Original cause of wound was Gradually Appeared. The date acquired was: 11/20/2019. The wound is located on the Right,Posterior Lower Leg. The wound measures 1.7cm length x 2cm width x 0.1cm depth; 2.67cm^2 area and 0.267cm^3 volume. There is Fat Layer (Subcutaneous Tissue) exposed. There is no tunneling or undermining noted. There is a medium amount of serous drainage noted. The wound margin is flat and intact. There is medium (34-66%) pink granulation within the wound bed. There is a medium (34-66%) amount of necrotic tissue within the wound bed including Adherent Slough. Wound #3 status is Open. Original cause of wound was Trauma. The date acquired was: 06/04/2020. The wound is located on the Right Knee. The wound measures 2.3cm length x 2.7cm width x 0.1cm depth; 4.877cm^2 area and 0.488cm^3 volume. There is Fat Layer (Subcutaneous Tissue) exposed. There is  no tunneling or undermining noted. There is a small amount of sanguinous drainage noted. The wound margin is flat and intact. There is small (1-33%) red granulation within the wound bed. There is no necrotic tissue within the wound bed. General Notes: scabbed Wound #4 status is Open. Original cause of wound was Blister. The date acquired was: 11/20/2019. The wound is located on the Left,Dorsal Foot. The wound measures 1.3cm length x 1.4cm width x 0.1cm depth; 1.429cm^2 area and 0.143cm^3 volume. There is Fat Layer (Subcutaneous Tissue) exposed. There is no tunneling or undermining noted. There is a medium amount of serous drainage noted. The wound margin is flat and intact. There is no granulation within the wound bed. There is a large (67-100%) amount of necrotic tissue within the  wound bed including Adherent Slough. Wound #5 status is Open. Original cause of wound was Trauma. The date acquired was: 05/28/2020. The wound is located on the Right,Lateral Foot. The wound measures 1cm length x 0.8cm width x 0.1cm depth; 0.628cm^2 area and 0.063cm^3 volume. There is Fat Layer (Subcutaneous Tissue) exposed. There is no tunneling or undermining noted. There is a medium amount of serosanguineous drainage noted. The wound margin is flat and intact. There is large (67-100%) red granulation within the wound bed. There is no necrotic tissue within the wound bed. Wound #6 status is Open. Original cause of wound was Gradually Appeared. The date acquired was: 11/20/2019. The wound is located on the Left,Posterior Lower Leg. The wound measures 3.3cm length x 3.9cm width x 0.1cm depth; 10.108cm^2 area and 1.011cm^3 volume. There is Fat Layer (Subcutaneous Tissue) exposed. There is no tunneling or undermining noted. There is a large amount of serous drainage noted. The wound margin is flat and intact. There is medium (34-66%) red granulation within the wound bed. There is a medium (34-66%) amount of necrotic tissue within the wound bed including Adherent Slough. Wound #7 status is Open. Original cause of wound was Trauma. The date acquired was: 06/04/2020. The wound is located on the Left,Proximal,Anterior Lower Leg. The wound measures 1.3cm length x 1.6cm width x 0.1cm depth; 1.634cm^2 area and 0.163cm^3 volume. There is Fat Layer (Subcutaneous Tissue) exposed. There is no tunneling or undermining noted. There is a medium amount of serosanguineous drainage noted. The wound margin is flat and intact. There is large (67-100%) red granulation within the wound bed. There is no necrotic tissue within the wound bed. Wound #8 status is Open. Original cause of wound was Pressure Injury. The date acquired was: 05/23/2019. The wound is located on the Left,Posterior Upper Leg. The wound measures 2.4cm length x  1.8cm width x 0.1cm depth; 3.393cm^2 area and 0.339cm^3 volume. There is Fat Layer (Subcutaneous Tissue) exposed. There is no tunneling or undermining noted. There is a small amount of serosanguineous drainage noted. The wound margin is flat and intact. There is medium (34-66%) red granulation within the wound bed. There is a medium (34-66%) amount of necrotic tissue within the wound bed including Adherent Slough. Wound #9 status is Open. Original cause of wound was Pressure Injury. The date acquired was: 05/23/2019. The wound is located on the Left,Proximal,Posterior Upper Leg. The wound measures 0.8cm length x 0.5cm width x 0.1cm depth; 0.314cm^2 area and 0.031cm^3 volume. There is Fat Layer (Subcutaneous Tissue) exposed. There is no tunneling or undermining noted. There is a small amount of serosanguineous drainage noted. The wound margin is flat and intact. There is small (1-33%) pink granulation within the wound bed. There is a large (  67-100%) amount of necrotic tissue within the wound bed including [place]. 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 There was a Three Layer Compression Therapy Procedure by [name], RN. Post procedure Diagnosis Wound #1: Same as Pre-Procedure Wound #2 Pre-procedure diagnosis of Wound #2 is a Venous Leg Ulcer located on the Right,Posterior Lower Leg .  There was a Three Layer Compression Therapy Procedure by [name], RN. Post procedure Diagnosis Wound #2: Same as Pre-Procedure Wound #3 Pre-procedure diagnosis of Wound #3 is an Abrasion located on the Right Knee . There was a Three Layer Compression Therapy Procedure by [name], RN. Post procedure Diagnosis Wound #3: Same as Pre-Procedure Wound #4 Pre-procedure diagnosis of Wound #4 is a Lymphedema located on the Left,Dorsal Foot . There was a Three Layer Compression Therapy Procedure by [name], RN. Post procedure Diagnosis Wound #4: Same as Pre-Procedure Wound #5 Pre-procedure diagnosis of Wound #5 is a Lymphedema located on the Right,Lateral Foot . There was a Three Layer Compression Therapy Procedure by [name], RN. Post procedure Diagnosis Wound #5: Same as Pre-Procedure Wound #6 Pre-procedure diagnosis of Wound #6 is a Lymphedema located on the Left,Posterior Lower Leg . There was a Three Layer Compression Therapy Procedure by [name], RN. Post procedure Diagnosis Wound #6: Same as Pre-Procedure Wound #7 Pre-procedure diagnosis of Wound #7 is a Lymphedema located on the Left,Proximal,Anterior Lower Leg . There was a Three Layer Compression Therapy Procedure by [name], RN. Post procedure Diagnosis Wound #7: Same as Pre-Procedure Wound #8 Pre-procedure diagnosis of Wound #8 is a Pressure Ulcer located on the Left,Posterior Upper Leg . There was a Three Layer Compression Therapy Procedure by [name], RN. Post procedure Diagnosis Wound #8: Same as Pre-Procedure Wound #9 Pre-procedure diagnosis of Wound #9 is a Pressure Ulcer located on the Left,Proximal,Posterior Upper Leg . There was a Three Layer Compression Therapy Procedure by [name], RN. Post procedure Diagnosis Wound #9: Same as Pre-Procedure Plan Follow-up Appointments: Return Appointment in 1 week. Nurse Visit: - on Friday Bathing/ Shower/  Hygiene: May shower with protection but do not get wound dressing(s) wet. - May use cast protectors over wraps. You can find these at [facility] or [facility] Edema Control - Lymphedema / SCD / Other: Elevate legs to the level of the heart or above for 30 minutes daily and/or when sitting, a frequency of: Avoid standing for long periods of time. Home Health: Admit to [facility] for wound care. May utilize formulary equivalent dressing for wound treatment orders unless otherwise specified. Jackquline [place] WOUND #1: - Malleolus Wound Laterality: Right, Medial Cleanser: Soap and Water 2 x Per Week/15 Days Discharge Instructions: May shower and wash wound with dial antibacterial soap and water prior to dressing change. Cleanser: Wound Cleanser 2 x Per Week/15 Days Discharge Instructions: Cleanse the wound with wound cleanser prior to applying a clean dressing using gauze sponges, not tissue or cotton balls. Prim Dressing: KerraCel Ag Gelling Fiber Dressing, 4x5 in (silver alginate) 2 x Per  Week/15 Days ary Discharge Instructions: Apply silver alginate to wound bed as instructed Secondary Dressing: Woven Gauze Sponge, Non-Sterile 4x4 in 2 x Per Week/15 Days Discharge Instructions: Apply over primary dressing as directed. Secondary Dressing: ABD Pad, 5x9 2 x Per Week/15 Days Discharge Instructions: Apply over primary dressing as directed. Com pression Wrap: ThreePress (3 layer compression wrap) 2 x Per Week/15 Days Discharge Instructions: LIGHTLY apply three layer compression as directed. WOUND #2: - Lower Leg Wound Laterality: Right, Posterior Cleanser: Soap and Water 2 x Per Week/15 Days Discharge Instructions: May shower and wash wound with dial antibacterial soap and water prior to dressing change. Cleanser: Wound Cleanser 2 x Per Week/15 Days Discharge Instructions: Cleanse the wound with wound cleanser prior to applying a clean dressing using gauze sponges, not tissue or cotton balls. Prim Dressing:  KerraCel Ag Gelling Fiber Dressing, 4x5 in (silver alginate) 2 x Per Week/15 Days ary Discharge Instructions: Apply silver alginate to wound bed as instructed WOUND #8: - Upper Leg Wound Laterality: Left, Posterior Cleanser: Normal Saline (DME) (Generic) Every Other Day/15 Days Discharge Instructions: Cleanse the wound with Normal Saline prior to applying a clean dressing using gauze sponges, not tissue or cotton balls. Cleanser: Wound Cleanser Every Other Day/15 Days Discharge Instructions: Cleanse the wound with wound cleanser prior to applying a clean dressing using gauze sponges, not tissue or cotton balls. Prim Dressing: KerraCel Ag Gelling Fiber Dressing, 4x5 in (silver alginate) (DME) (Generic) Every Other Day/15 Days ary Discharge Instructions: Apply silver alginate to wound bed as instructed Secondary Dressing: Woven Gauze Sponge, Non-Sterile 4x4 in (DME) (Generic) Every Other Day/15 Days Discharge Instructions: Apply over primary dressing as directed. Secondary Dressing: ComfortFoam Border, 4x4 in (silicone border) (DME) (Generic) Every Other Day/15 Days Discharge Instructions: Apply over primary dressing as directed. WOUND #9: - Upper Leg Wound Laterality: Left, Posterior, Proximal Cleanser: Normal Saline (DME) (Generic) Every Other Day/15 Days Discharge Instructions: Cleanse the wound with Normal Saline prior to applying a clean dressing using gauze sponges, not tissue or cotton balls. Cleanser: Wound Cleanser Every Other Day/15 Days Discharge Instructions: Cleanse the wound with wound cleanser prior to applying a clean dressing using gauze sponges, not tissue or cotton balls. Prim Dressing: KerraCel Ag Gelling Fiber Dressing, 4x5 in (silver alginate) (DME) (Generic) Every Other Day/15 Days ary Discharge Instructions: Apply silver alginate to wound bed as instructed Secondary Dressing: Woven Gauze Sponge, Non-Sterile 4x4 in (DME) (Generic) Every Other Day/15 Days Discharge  Instructions: Apply over primary dressing as directed. Secondary Dressing: ComfortFoam Border, 4x4 in (silicone border) (DME) (Generic) Every Other Day/15 Days Discharge Instructions: Apply over primary dressing as directed. 1. We use silver alginate absorptive secondary dressings ABDs and put him in 3 layer compression. I asked him to keep his legs elevated. Given the fact that he tolerated Unna boots in the past I think he should be able to tolerate 3 layer compression. 2. On the areas on his right knee and left posterior thigh we used Iodoflex to try and clean up the surfaces I think the knee injury was an abrasion. The posterior thigh likely dependent edema 3. The patient is systemically fluid volume overloaded although I am not sure if this represents heart failure or cirrhosis. May be both. I will try to have a look at his echocardiogram before I see him next week and determine whether we need to contact Dr. Stanford Breed. He says he is on 80 mg of Lasix and his weight has been stable at 175 pounds 4.  His legs look like chronic venous insufficiency. His reflux studies however were not all that remarkable including no evidence of superficial vein reflux in the left lower extremity 5. The patient looks very frail. He is not able to walk I am not sure if he is able to even transfer on his own. With regards to angiography we will see how this goes with standard dressings before reaching out to vein and vascular. I spent 45 minutes in review of this patient's past medical history face-to-face evaluation and preparation of this record Electronic Signature(s) Signed: 06/12/2020 5:22:30 PM By: Linton Ham MD Entered By: Linton Ham on 06/12/2020 17:04:55 -------------------------------------------------------------------------------- HxROS Details Patient Name: Date of Service: [name], [name] 06/12/2020 1:15 PM Medical Record Number: [id number] Patient Account Number: [id number] Date of  Birth/Sex: Treating RN: 1940-06-01 (80 y.o. [name] Primary Care Provider: [name] Other Clinician: Referring Provider: Treating Provider/Extender: [name] in Treatment: 0 Information Obtained From Patient Constitutional Symptoms (General Health) Complaints and Symptoms: Negative for: Fatigue; Fever; Chills; Marked Weight Change Medical History: Past Medical History Notes: chronic pain Eyes Complaints and Symptoms: Positive for: Glasses / Contacts - reading Negative for: Dry Eyes; Vision Changes Medical History: Negative for: Cataracts; Glaucoma Ear/Nose/Mouth/Throat Complaints and Symptoms: Negative for: Chronic sinus problems or rhinitis Respiratory Complaints and Symptoms: Positive for: Shortness of Breath Negative for: Chronic or frequent coughs Medical History: Positive for: Chronic Obstructive Pulmonary Disease (COPD) Cardiovascular Complaints and Symptoms: Negative for: Chest pain Medical History: Positive for: 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; Congestive Heart Failure; Coronary Artery Disease; Hypertension Past Medical History Notes: carotid stenosis, hyperlipidemia, AAA Gastrointestinal Complaints and Symptoms: Negative for: Frequent diarrhea; Nausea; Vomiting Medical History: Positive for: Cirrhosis Endocrine Complaints and Symptoms: Negative for: Heat/cold intolerance Medical History: Negative for: Type I Diabetes; Type II Diabetes Genitourinary Complaints and Symptoms: Negative for: Frequent urination Medical History: Negative for: End Stage Renal Disease Past Medical History Notes: CKD stage 3, BPH Integumentary (Skin) Complaints and Symptoms: Positive for: Wounds - bil lower legs Medical History: Negative for: History of Burn Musculoskeletal Complaints and Symptoms: Positive for: Muscle Pain; Muscle Weakness Medical History: Positive for: Osteoarthritis Negative for:  Gout; Osteomyelitis Neurologic Complaints and Symptoms: Negative for: Numbness/parasthesias Medical History: Past Medical History Notes: cerebrovascular disease, cerebral palsy Psychiatric Complaints and Symptoms: Negative for: Claustrophobia; Suicidal Medical History: Negative for: Anorexia/bulimia; Confinement Anxiety Hematologic/Lymphatic Medical History: Positive for: Anemia Immunological Oncologic Medical History: Negative for: Received Chemotherapy; Received Radiation Immunizations Pneumococcal Vaccine: Received Pneumococcal Vaccination: Yes Implantable Devices No devices added Hospitalization / Surgery History Type of Hospitalization/Surgery 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 reductionx2 total left hip revision Family and Social History Cancer: Yes - Siblings; Diabetes: No; Heart Disease: Yes - Father; Hereditary Spherocytosis: No; Hypertension: Yes - Father; Kidney Disease: No; Lung Disease: No; Seizures: No; Stroke: No; Thyroid Problems: No; Tuberculosis: No; Former smoker - quit 2011; Marital Status - Widowed; Alcohol Use: Never; Drug Use: No History; Caffeine Use: Daily - soda; Financial Concerns: No; Food, Clothing or Shelter Needs: No; Support System Lacking: No; Transportation Concerns: No Engineer, maintenance) Signed: 06/12/2020 5:22:30 PM By: Linton Ham MD Signed: 06/12/2020 5:38:13 PM By: Baruch Gouty RN, BSN Entered By: Baruch Gouty on 06/12/2020 14:20:04 -------------------------------------------------------------------------------- SuperBill Details Patient Name: Date of Service: Brian Chiquito, [place]. 06/12/2020 Medical Record Number: [id number] Patient Account Number: [id number] Date of Birth/Sex: Treating RN: May 11, 1940 (80 y.o. 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 Modifier: Quantity: 1 Physician Procedures : CPT4 Code Description Modifier [id number] - WC PHYS LEVEL 4 - NEW PT ICD-10 Diagnosis Description I87.333 Chronic venous hypertension (idiopathic) with ulcer and inflammation of bilateral lower extremity I70.203 Unspecified atherosclerosis of native  arteries of extremities, bilateral legs L97.228 Non-pressure chronic ulcer of left calf with other specified severity L97.818 Non-pressure chronic ulcer of other part of right lower leg with other specified severity Quantity: 1 Electronic Signature(s) Signed: 06/12/2020 5:22:30 PM By: Linton Ham MD Entered By: Linton Ham on 06/12/2020 17:05:31

## 2020-06-13 ENCOUNTER — Other Ambulatory Visit: Payer: Self-pay | Admitting: *Deleted

## 2020-06-13 DIAGNOSIS — I872 Venous insufficiency (chronic) (peripheral): Secondary | ICD-10-CM

## 2020-06-13 DIAGNOSIS — L97222 Non-pressure chronic ulcer of left calf with fat layer exposed: Secondary | ICD-10-CM | POA: Diagnosis not present

## 2020-06-14 ENCOUNTER — Ambulatory Visit: Payer: Medicare Other

## 2020-06-14 ENCOUNTER — Ambulatory Visit (HOSPITAL_COMMUNITY): Admission: RE | Admit: 2020-06-14 | Payer: Medicare Other | Source: Ambulatory Visit

## 2020-06-14 DIAGNOSIS — L97222 Non-pressure chronic ulcer of left calf with fat layer exposed: Secondary | ICD-10-CM | POA: Diagnosis not present

## 2020-06-15 ENCOUNTER — Other Ambulatory Visit: Payer: Self-pay

## 2020-06-15 ENCOUNTER — Encounter (HOSPITAL_BASED_OUTPATIENT_CLINIC_OR_DEPARTMENT_OTHER): Payer: Medicare Other | Admitting: Internal Medicine

## 2020-06-15 DIAGNOSIS — L97129 Non-pressure chronic ulcer of left thigh with unspecified severity: Secondary | ICD-10-CM | POA: Diagnosis not present

## 2020-06-15 DIAGNOSIS — L97529 Non-pressure chronic ulcer of other part of left foot with unspecified severity: Secondary | ICD-10-CM | POA: Diagnosis not present

## 2020-06-15 DIAGNOSIS — L97229 Non-pressure chronic ulcer of left calf with unspecified severity: Secondary | ICD-10-CM | POA: Diagnosis not present

## 2020-06-15 DIAGNOSIS — I87333 Chronic venous hypertension (idiopathic) with ulcer and inflammation of bilateral lower extremity: Secondary | ICD-10-CM | POA: Diagnosis not present

## 2020-06-15 DIAGNOSIS — L97819 Non-pressure chronic ulcer of other part of right lower leg with unspecified severity: Secondary | ICD-10-CM | POA: Diagnosis not present

## 2020-06-15 NOTE — Telephone Encounter (Signed)
Unable to get in contact with the patient. VM full.

## 2020-06-19 ENCOUNTER — Encounter (HOSPITAL_BASED_OUTPATIENT_CLINIC_OR_DEPARTMENT_OTHER): Payer: Medicare Other | Attending: Internal Medicine | Admitting: Internal Medicine

## 2020-06-19 ENCOUNTER — Other Ambulatory Visit: Payer: Self-pay

## 2020-06-19 DIAGNOSIS — I509 Heart failure, unspecified: Secondary | ICD-10-CM | POA: Diagnosis not present

## 2020-06-19 DIAGNOSIS — I87333 Chronic venous hypertension (idiopathic) with ulcer and inflammation of bilateral lower extremity: Secondary | ICD-10-CM | POA: Diagnosis not present

## 2020-06-19 DIAGNOSIS — I890 Lymphedema, not elsewhere classified: Secondary | ICD-10-CM | POA: Insufficient documentation

## 2020-06-19 DIAGNOSIS — L97212 Non-pressure chronic ulcer of right calf with fat layer exposed: Secondary | ICD-10-CM | POA: Diagnosis not present

## 2020-06-19 DIAGNOSIS — L97528 Non-pressure chronic ulcer of other part of left foot with other specified severity: Secondary | ICD-10-CM | POA: Diagnosis not present

## 2020-06-19 DIAGNOSIS — L97228 Non-pressure chronic ulcer of left calf with other specified severity: Secondary | ICD-10-CM | POA: Insufficient documentation

## 2020-06-19 DIAGNOSIS — I110 Hypertensive heart disease with heart failure: Secondary | ICD-10-CM | POA: Diagnosis not present

## 2020-06-19 DIAGNOSIS — L97818 Non-pressure chronic ulcer of other part of right lower leg with other specified severity: Secondary | ICD-10-CM | POA: Diagnosis not present

## 2020-06-19 DIAGNOSIS — L97312 Non-pressure chronic ulcer of right ankle with fat layer exposed: Secondary | ICD-10-CM | POA: Diagnosis not present

## 2020-06-19 DIAGNOSIS — L97222 Non-pressure chronic ulcer of left calf with fat layer exposed: Secondary | ICD-10-CM | POA: Diagnosis not present

## 2020-06-19 NOTE — Progress Notes (Signed)
[name], [name] ([id number]) Visit Report for 06/19/2020 Arrival Information Details Patient Name: Date of Service: [name] 06/19/2020 10:15 A M Medical Record Number: [id number] Patient Account Number: [id number] Date of Birth/Sex: Treating RN: 02-05-1941 (80 y.o. [name] Primary Care Tanecia Mccay: [name] Other Clinician: Referring Cedric Mcclaine: Treating Eivin Mascio/Extender: [name] in Treatment: 1 Visit Information History Since Last Visit Added or deleted any medications: No Patient Arrived: Wheel Chair Any new allergies or adverse reactions: No Arrival Time: 11:13 Had a fall or experienced change in No Accompanied By: son activities of daily living that may affect Transfer Assistance: None risk of falls: Patient Identification Verified: Yes Signs or symptoms of abuse/neglect since last visito No Secondary Verification Process Completed: Yes Hospitalized since last visit: No Patient Requires Transmission-Based Precautions: No Implantable device outside of the clinic excluding No Patient Has Alerts: No cellular tissue based products placed in the center since last visit: Has Dressing in Place as Prescribed: Yes Has Compression in Place as Prescribed: Yes Pain Present Now: Yes Electronic Signature(s) Signed: 06/19/2020 5:57:00 PM By: Baruch Gouty RN, BSN Entered By: Baruch Gouty on 06/19/2020 11:14:31 -------------------------------------------------------------------------------- Compression Therapy Details Patient Name: Date of Service: [name], [name] 06/19/2020 10:15 A M Medical Record Number: [id number] Patient Account Number: [id number] Date of Birth/Sex: Treating RN: Aug 07, 1940 (80 y.o. [name] Primary Care Bailey Faiella: [name] Other Clinician: Referring Madalynn Pickelsimer: Treating Taquilla Downum/Extender: [name] in Treatment: 1 Compression Therapy  Performed for Wound Assessment: Wound #1 Right,Medial Malleolus Performed By: Clinician [name], RN Compression Type: Three Layer Post Procedure Diagnosis Same as Pre-procedure Electronic Signature(s) Signed: 06/19/2020 5:58:27 PM By: [name] RN Entered By: [name] on 06/19/2020 12:05:15 -------------------------------------------------------------------------------- Compression Therapy Details Patient Name: Date of Service: [name], [name] 06/19/2020 10:15 A M Medical Record Number: [id number] Patient Account Number: [id number] Date of Birth/Sex: Treating RN: 08/11/1940 (80 y.o. [name] Primary Care Ann-Marie Kluge: [name] Other Clinician: Referring Dahlton Hinde: Treating Codi Folkerts/Extender: [name] in Treatment: 1 Compression Therapy Performed for Wound Assessment: Wound #2 Right,Posterior Lower Leg Performed By: Clinician [name], RN Compression Type: Three Layer Post Procedure Diagnosis Same as Pre-procedure Electronic Signature(s) Signed: 06/19/2020 5:58:27 PM By: [name] RN Entered By: [name] on 06/19/2020 12:05:15 -------------------------------------------------------------------------------- Compression Therapy Details Patient Name: Date of Service: [name], [name] 06/19/2020 10:15 A M Medical Record Number: [id number] Patient Account Number: [id number] Date of Birth/Sex: Treating RN: 08-11-1940 (80 y.o. [name] Primary Care Yashua Bracco: [name] Other Clinician: Referring Lora Chavers: Treating Avari Gelles/Extender: [name] in Treatment: 1 Compression Therapy Performed for Wound Assessment: Wound #4 Left,Dorsal Foot Performed By: Clinician [name], RN Compression Type: Three Layer Post Procedure Diagnosis Same as Pre-procedure Electronic Signature(s) Signed: 06/19/2020 5:58:27 PM By: [name] RN Entered By: [name] on 06/19/2020 12:05:15 -------------------------------------------------------------------------------- Compression Therapy Details Patient Name: Date of Service: [name], [name] 06/19/2020 10:15 A M Medical Record Number: [id number] Patient Account Number: [id number] Date of Birth/Sex: Treating RN: 1940/11/02 (80 y.o. [name] Primary Care Adya Wirz: [name] Other Clinician: Referring Cayson Kalb: Treating Vick Filter/Extender: [name] in Treatment: 1 Compression Therapy Performed for Wound Assessment: Wound #6 Left,Posterior Lower Leg Performed By: Clinician [name], RN Compression Type: Three Layer Post Procedure Diagnosis Same as Pre-procedure Electronic Signature(s) Signed: 06/19/2020 5:58:27 PM By: [name] RN Entered By: [name] on 06/19/2020 12:05:15 --------------------------------------------------------------------------------  Compression Therapy Details Patient Name: Date of Service: [name], [name] 06/19/2020 10:15 A M Medical Record Number: [id number] Patient Account Number: [id number] Date of Birth/Sex: Treating RN: Aug 14, 1940 (80 y.o. [name] Primary Care Yanel Dombrosky: [name] Other Clinician: Referring Mariachristina Holle: Treating [place]/Extender: [name] in Treatment: 1 Compression Therapy Performed for Wound Assessment: Wound #7 Left,Proximal,Anterior Lower Leg Performed By: Clinician [name], RN Compression Type: Three Layer Post Procedure Diagnosis Same as Pre-procedure Electronic Signature(s) Signed: 06/19/2020 5:58:27 PM By: [name] RN Entered By: [name] on 06/19/2020 12:05:15 -------------------------------------------------------------------------------- Encounter Discharge Information Details Patient Name: Date of Service: [name], [name]  06/19/2020 10:15 A M Medical Record Number: [id number] Patient Account Number: [id number] Date of Birth/Sex: Treating RN: May 02, 1940 (80 y.o. [name] Primary Care Trice Aspinall: [name] Other Clinician: Referring Jamyson Jirak: Treating Alize Acy/Extender: [name] in Treatment: 1 Encounter Discharge Information Items Discharge Condition: Stable Ambulatory Status: Wheelchair Discharge Destination: Home Transportation: Private Auto Accompanied By: son Schedule Follow-up Appointment: Yes Clinical Summary of Care: Electronic Signature(s) Signed: 06/19/2020 1:49:11 PM By: [name] Entered By: [name] on 06/19/2020 12:55:52 -------------------------------------------------------------------------------- Lower Extremity Assessment Details Patient Name: Date of Service: [name], [name] 06/19/2020 10:15 A M Medical Record Number: [id number] Patient Account Number: [id number] Date of Birth/Sex: Treating RN: 1941/01/04 (80 y.o. [name] Primary Care Richad Ramsay: [name] Other Clinician: Referring Kalyani Maeda: Treating Tajah Noguchi/Extender: [name] in Treatment: 1 Edema Assessment Assessed: [Left: No] [Right: No] Edema: [Left: Yes] [Right: Yes] Calf Left: Right: Point of Measurement: From Medial Instep 31 cm 29 cm Ankle Left: Right: Point of Measurement: From Medial Instep 24 cm 22.5 cm Vascular Assessment Pulses: Dorsalis Pedis Palpable: [Left:No] [Right:No] Electronic Signature(s) Signed: 06/19/2020 5:57:00 PM By: Baruch Gouty RN, BSN Entered By: Baruch Gouty on 06/19/2020 11:19:15 -------------------------------------------------------------------------------- Multi Wound Chart Details Patient Name: Date of Service: [name], [name] 06/19/2020 10:15 A M Medical Record Number: [id number] Patient Account Number: [id number] Date of Birth/Sex: Treating RN: 1940-10-30  (80 y.o. 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 Primary Etiology: Lymphedema Lymphedema N/A Secondary Etiology: Anemia, Chronic Obstructive Anemia, Chronic Obstructive Anemia, Chronic Obstructive Comorbid History: Pulmonary Disease (COPD), Pulmonary Disease (COPD), Pulmonary Disease (COPD), Arrhythmia, Congestive Heart Failure, Arrhythmia, Congestive Heart Failure, Arrhythmia, Congestive Heart Failure, Coronary Artery Disease, Coronary Artery Disease, Coronary Artery Disease, Hypertension, Cirrhosis , Osteoarthritis Hypertension, Cirrhosis , Osteoarthritis Hypertension, Cirrhosis , Osteoarthritis 11/20/2019 11/20/2019 06/04/2020 Date Acquired: 1 1 1  Weeks of Treatment: Open Open Open Wound Status: 3.7x3.2x0.1 1.5x1.5x0.1 1.3x1.3x0.1 Measurements L x W x D (cm) 9.299 1.767 1.327 A (cm) : rea 0.93 0.177 0.133 Volume (cm) : 22.10% 33.80% 72.80% % Reduction in Area: 22.10% 33.70% 72.70% % Reduction in Volume: Full Thickness Without Exposed Full Thickness Without Exposed Full Thickness Without Exposed Classification: Support Structures Support Structures Support Structures Medium Medium Small Exudate Amount: Serosanguineous Serosanguineous Serosanguineous Exudate Type: red, brown red, brown red, brown Exudate Color: Flat and Intact Flat and Intact Flat and Intact Wound Margin: Large (67-100%) Small (1-33%) Large (67-100%) Granulation Amount: Red, Pink Pink Red,  Pink  Granulation Quality: Small (1-33%) Large (67-100%) Small (1-33%) Necrotic Amount: Fat Layer (Subcutaneous Tissue): Yes Fat Layer (Subcutaneous Tissue): Yes Fat Layer (Subcutaneous Tissue): Yes Exposed Structures: Fascia: No Fascia: No Fascia: No Tendon: No Tendon: No Tendon: No Muscle: No Muscle: No Muscle: No Joint: No Joint: No Joint: No Bone: No Bone: No Bone: No Small (1-33%) Small (1-33%) Small (1-33%) Epithelialization: N/A N/A N/A Assessment Notes: Compression Therapy Compression Therapy N/A Procedures Performed: Wound Number: [id number]  Photos: No Photos Left, Dorsal Foot Right, Lateral Foot Left, Posterior Lower Leg Wound Location: Blister Trauma Gradually Appeared Wounding Event: Lymphedema Lymphedema Lymphedema Primary Etiology: N/A N/A Venous Leg Ulcer Secondary Etiology: Anemia, Chronic Obstructive Anemia, Chronic Obstructive Anemia, Chronic Obstructive Comorbid History: Pulmonary Disease (COPD), Pulmonary Disease (COPD), Pulmonary Disease (COPD), Arrhythmia, Congestive Heart Failure, Arrhythmia, Congestive Heart Failure, Arrhythmia, Congestive Heart Failure, Coronary Artery Disease, Coronary Artery Disease, Coronary Artery Disease, Hypertension, Cirrhosis , Osteoarthritis Hypertension, Cirrhosis , Osteoarthritis Hypertension, Cirrhosis , Osteoarthritis 11/20/2019 05/28/2020 11/20/2019 Date Acquired: 1 1 1  Weeks of Treatment: Open Healed - Epithelialized Open Wound Status: 1x2x0.1 0x0x0 3.2x3.3x0.1 Measurements L x W x D (cm) 1.571 0 8.294 A (cm) : rea 0.157 0 0.829 Volume (cm) : -9.90% 100.00% 17.90% % Reduction in Area: -9.80% 100.00% 18.00% % Reduction in Volume: Full Thickness Without Exposed Full Thickness Without Exposed Full Thickness Without Exposed Classification: Support Structures Support Structures Support Structures Medium None Present Medium Exudate Amount: Serosanguineous N/A Purulent Exudate Type: red, brown N/A yellow,  brown, green Exudate Color: Flat and Intact N/A Flat and Intact Wound Margin: Medium (34-66%) None Present (0%) None Present (0%) Granulation Amount: Pink N/A N/A Granulation Quality: Medium (34-66%) None Present (0%) Large (67-100%) Necrotic Amount: Fat Layer (Subcutaneous Tissue): Yes Fascia: No Fat Layer (Subcutaneous Tissue): Yes Exposed Structures: Fascia: No Fat Layer (Subcutaneous Tissue): No Fascia: No Tendon: No Tendon: No Tendon: No Muscle: No Muscle: No Muscle: No Joint: No Joint: No Joint: No Bone: No Bone: No Bone: No None Large (67-100%) None Epithelialization: N/A N/A green drainage Assessment Notes: Compression Therapy N/A Compression Therapy Procedures Performed: Wound Number: [id number]  Photos: Left, Proximal, Anterior Lower Leg Left, Posterior Upper Leg Left, Proximal, Posterior Upper Leg Wound Location: Trauma Pressure Injury Pressure Injury Wounding Event: Lymphedema Pressure Ulcer Pressure Ulcer Primary Etiology: N/A N/A N/A Secondary Etiology: Anemia, Chronic Obstructive Anemia, Chronic Obstructive Anemia, Chronic Obstructive Comorbid History: Pulmonary Disease (COPD), Pulmonary Disease (COPD), Pulmonary Disease (COPD), Arrhythmia, Congestive Heart Failure, Arrhythmia, Congestive Heart Failure, Arrhythmia, Congestive Heart Failure, Coronary Artery Disease, Coronary Artery Disease, Coronary Artery Disease, Hypertension, Cirrhosis , Osteoarthritis Hypertension, Cirrhosis , Osteoarthritis Hypertension, Cirrhosis , Osteoarthritis 06/04/2020 05/23/2019 05/23/2019 Date Acquired: 1 1 1  Weeks of Treatment: Open Open Open Wound Status: 1x1.1x0.1 0.5x0.5x0.1 2x2x0.1 Measurements L x W x D (cm) 0.864 0.196 3.142 A (cm) : rea 0.086 0.02 0.314 Volume (cm) : 47.10% 94.20% -900.60% % Reduction in Area: 47.20% 94.10% -912.90% % Reduction in Volume: Full Thickness Without Exposed Category/Stage II Category/Stage II Classification: Support  Structures Medium Small Medium Exudate Amount: Serosanguineous Serous Serosanguineous Exudate Type: red, brown amber red, brown Exudate Color: Flat and Intact Flat and Intact Flat and Intact Wound Margin: Small (1-33%) Large (67-100%) Medium (34-66%) Granulation Amount: Pink Pink Pink Granulation Quality: Large (67-100%) None Present (0%) Medium (34-66%) Necrotic Amount: Fat Layer (Subcutaneous Tissue): Yes Fat Layer (Subcutaneous Tissue): Yes Fat Layer (Subcutaneous Tissue): Yes Exposed Structures: Fascia: No Fascia: No Fascia: No Tendon: No Tendon: No Tendon: No Muscle: No Muscle:  No Muscle: No Joint: No Joint: No Joint: No Bone: No Bone: No Bone: No Small (1-33%) Medium (34-66%) Small (1-33%) Epithelialization: N/A N/A N/A Assessment Notes: Compression Therapy N/A N/A Procedures Performed: Treatment Notes Wound #1 (Malleolus) Wound Laterality: Right, Medial Cleanser Wound Cleanser Discharge Instruction: Cleanse the wound with wound cleanser prior to applying a clean dressing using gauze sponges, not tissue or cotton balls. Soap and Water Discharge Instruction: May shower and wash wound with dial antibacterial soap and water prior to dressing change. Peri-Wound Care Topical Primary Dressing KerraCel Ag Gelling Fiber Dressing, 4x5 in (silver alginate) Discharge Instruction: Apply silver alginate to wound bed as instructed Secondary Dressing ABD Pad, 5x9 Discharge Instruction: Apply over primary dressing as directed. Secured With Compression Wrap ThreePress (3 layer compression wrap) Discharge Instruction: Apply LIGHTY three layer compression as directed. Compression Stockings Add-Ons Wound #2 (Lower Leg) Wound Laterality: Right, Posterior Cleanser Wound Cleanser Discharge Instruction: Cleanse the wound with wound cleanser prior to applying a clean dressing using gauze sponges, not tissue or cotton balls. Soap and Water Discharge Instruction: May shower  and wash wound with dial antibacterial soap and water prior to dressing change. Peri-Wound Care Topical Primary Dressing KerraCel Ag Gelling Fiber Dressing, 4x5 in (silver alginate) Discharge Instruction: Apply silver alginate to wound bed as instructed Secondary Dressing ABD Pad, 5x9 Discharge Instruction: Apply over primary dressing as directed. Secured With Compression Wrap ThreePress (3 layer compression wrap) Discharge Instruction: Apply LIGHTY three layer compression as directed. Compression Stockings Add-Ons Wound #3 (Knee) Wound Laterality: Right Cleanser Wound Cleanser Discharge Instruction: Cleanse the wound with wound cleanser prior to applying a clean dressing using gauze sponges, not tissue or cotton balls. Soap and Water Discharge Instruction: May shower and wash wound with dial antibacterial soap and water prior to dressing change. Peri-Wound Care Topical Primary Dressing KerraCel Ag Gelling Fiber Dressing, 4x5 in (silver alginate) Discharge Instruction: Apply silver alginate to wound bed as instructed Secondary Dressing ComfortFoam Border, 4x4 in (silicone border) Discharge Instruction: Apply over primary dressing as directed. Secured With Compression Wrap Compression Stockings Add-Ons Wound #4 (Foot) Wound Laterality: Dorsal, Left Cleanser Wound Cleanser Discharge Instruction: Cleanse the wound with wound cleanser prior to applying a clean dressing using gauze sponges, not tissue or cotton balls. Soap and Water Discharge Instruction: May shower and wash wound with dial antibacterial soap and water prior to dressing change. Peri-Wound Care Topical Primary Dressing KerraCel Ag Gelling Fiber Dressing, 4x5 in (silver alginate) Discharge Instruction: Apply silver alginate to wound bed as instructed Secondary Dressing ABD Pad, 5x9 Discharge Instruction: Apply over primary dressing as directed. Secured With Compression Wrap ThreePress (3 layer compression  wrap) Discharge Instruction: Apply LIGHTY three layer compression as directed. Compression Stockings Add-Ons Wound #6 (Lower Leg) Wound Laterality: Left, Posterior Cleanser Wound Cleanser Discharge Instruction: Cleanse the wound with wound cleanser prior to applying a clean dressing using gauze sponges, not tissue or cotton balls. Soap and Water Discharge Instruction: May shower and wash wound with dial antibacterial soap and water prior to dressing change. Peri-Wound Care Topical Primary Dressing KerraCel Ag Gelling Fiber Dressing, 4x5 in (silver alginate) Discharge Instruction: Apply silver alginate to wound bed as instructed Secondary Dressing ABD Pad, 5x9 Discharge Instruction: Apply over primary dressing as directed. Secured With Compression Wrap ThreePress (3 layer compression wrap) Discharge Instruction: Apply LIGHTY three layer compression as directed. Compression Stockings Add-Ons Wound #7 (Lower Leg) Wound Laterality: Left, Anterior, Proximal Cleanser Wound Cleanser Discharge Instruction: Cleanse the wound with wound cleanser prior to applying  a clean dressing using gauze sponges, not tissue or cotton balls. Soap and Water Discharge Instruction: May shower and wash wound with dial antibacterial soap and water prior to dressing change. Peri-Wound Care Topical Primary Dressing KerraCel Ag Gelling Fiber Dressing, 4x5 in (silver alginate) Discharge Instruction: Apply silver alginate to wound bed as instructed Secondary Dressing ABD Pad, 5x9 Discharge Instruction: Apply over primary dressing as directed. Secured With Compression Wrap ThreePress (3 layer compression wrap) Discharge Instruction: Apply LIGHTY three layer compression as directed. Compression Stockings Add-Ons Wound #8 (Upper Leg) Wound Laterality: Left, Posterior Cleanser Wound Cleanser Discharge Instruction: Cleanse the wound with wound cleanser prior to applying a clean dressing using gauze sponges,  not tissue or cotton balls. Soap and Water Discharge Instruction: May shower and wash wound with dial antibacterial soap and water prior to dressing change. Peri-Wound Care Topical Primary Dressing KerraCel Ag Gelling Fiber Dressing, 4x5 in (silver alginate) Discharge Instruction: Apply silver alginate to wound bed as instructed Secondary Dressing ComfortFoam Border, 4x4 in (silicone border) Discharge Instruction: Apply over primary dressing as directed. Secured With Compression Wrap Compression Stockings Add-Ons Wound #9 (Upper Leg) Wound Laterality: Left, Posterior, Proximal Cleanser Wound Cleanser Discharge Instruction: Cleanse the wound with wound cleanser prior to applying a clean dressing using gauze sponges, not tissue or cotton balls. Soap and Water Discharge Instruction: May shower and wash wound with dial antibacterial soap and water prior to dressing change. Peri-Wound Care Topical Primary Dressing KerraCel Ag Gelling Fiber Dressing, 4x5 in (silver alginate) Discharge Instruction: Apply silver alginate to wound bed as instructed Secondary Dressing ComfortFoam Border, 4x4 in (silicone border) Discharge Instruction: Apply over primary dressing as directed. Secured With Compression Wrap Compression Stockings Environmental education officer) Signed: 06/19/2020 5:58:27 PM By: [name] RN Signed: 06/19/2020 5:59:48 PM By: Linton Ham MD Entered By: Linton Ham on 06/19/2020 17:49:15 -------------------------------------------------------------------------------- Multi-Disciplinary Care Plan Details Patient Name: Date of Service: [name], [name] 06/19/2020 10:15 A M Medical Record Number: [id number] Patient Account Number: [id number] Date of Birth/Sex: Treating RN: 1940/07/31 (80 y.o. [name], [name] Primary Care Rashidah [place]: [name] Other Clinician: Referring Winner Valeriano: Treating Harding Thomure/Extender: [name] in Treatment: 1 Active Inactive Wound/Skin Impairment Nursing Diagnoses: Impaired tissue integrity Knowledge deficit related to ulceration/compromised skin integrity Goals: Patient will have a decrease in wound volume by X% from date: (specify in notes) Date Initiated: 06/12/2020 Target Resolution Date: 06/29/2020 Goal Status: Active Patient/caregiver will verbalize understanding of skin care regimen Date Initiated: 06/12/2020 Target Resolution Date: 06/29/2020 Goal Status: Active Ulcer/skin breakdown will have a volume reduction of 30% by week 4 Date Initiated: 06/12/2020 Target Resolution Date: 06/29/2020 Goal Status: Active Interventions: Assess patient/caregiver ability to obtain necessary supplies Assess patient/caregiver ability to perform ulcer/skin care regimen upon admission and as needed Assess ulceration(s) every visit Notes: Electronic Signature(s) Signed: 06/19/2020 5:58:27 PM By: [name] RN Entered By: [name] on 06/19/2020 12:04:19 -------------------------------------------------------------------------------- Pain Assessment Details Patient Name: Date of Service: [name], [name] 06/19/2020 10:15 A M Medical Record Number: [id number] Patient Account Number: [id number] Date of Birth/Sex: Treating RN: 12-13-1940 (80 y.o. [name] Primary Care Dennette Faulconer: [name] Other Clinician: Referring Saleen Peden: Treating Ana Liaw/Extender: [name] in Treatment: 1 Active Problems Location of Pain Severity and Description of Pain Patient Has Paino Yes Site Locations Pain Location: Generalized Pain, Pain in Ulcers With Dressing Change: Yes Duration of the Pain. Constant / Intermittento Constant Rate the pain. Current Pain Level: 9 Worst Pain Level:  10 Least Pain Level: 7 Character of Pain Describe the Pain: Aching, Burning Pain Management and Medication Current Pain  Management: Medication: Yes Is the Current Pain Management Adequate: Adequate How does your wound impact your activities of daily livingo Sleep: Yes Bathing: No Appetite: No Relationship With Others: No Bladder Continence: No Emotions: Yes Bowel Continence: No Hobbies: No Toileting: No Dressing: No Electronic Signature(s) Signed: 06/19/2020 5:57:00 PM By: Baruch Gouty RN, BSN Entered By: Baruch Gouty on 06/19/2020 11:16:11 -------------------------------------------------------------------------------- Patient/Caregiver Education Details Patient Name: Date of Service: [name], [place] 3/1/2022andnbsp10:15 A M Medical Record Number: [id number] Patient Account Number: [id number] Date of Birth/Gender: Treating RN: Dec 25, 1940 (80 y.o. [name] Primary Care Physician: [name] Other Clinician: Referring Physician: Treating Physician/Extender: [name] in Treatment: 1 Education Assessment Education Provided To: Patient Education Topics Provided Basic Hygiene: Responses: Reinforcements needed Electronic Signature(s) Signed: 06/19/2020 5:58:27 PM By: [name] RN Entered By: [name] on 06/19/2020 12:04:44 -------------------------------------------------------------------------------- Wound Assessment Details Patient Name: Date of Service: [name], [name] 06/19/2020 10:15 A M Medical Record Number: [id number] Patient Account Number: [id number] Date of Birth/Sex: Treating RN: [birth year] (80 y.o. [name] Primary Care Tauno Falotico: [name] Other Clinician: Referring Juni Glaab: Treating Dravyn Severs/Extender: [name] in Treatment: 1 Wound Status Wound Number: 1 Primary Venous Leg Ulcer Etiology: Wound Location: Right, Medial Malleolus Secondary Lymphedema Wounding Event: Gradually Appeared Etiology: Date Acquired: 11/20/2019 Wound Open Weeks  Of Treatment: 1 Status: Clustered Wound: No Comorbid Anemia, Chronic Obstructive Pulmonary Disease (COPD), History: Arrhythmia, Congestive Heart Failure, Coronary Artery Disease, Hypertension, Cirrhosis , Osteoarthritis Photos Wound Measurements Length: (cm) 3.7 Width: (cm) 3.2 Depth: (cm) 0.1 Area: (cm) 9.299 Volume: (cm) 0.93 % Reduction in Area: 22.1% % Reduction in Volume: 22.1% Epithelialization: Small (1-33%) Tunneling: No Undermining: No Wound Description Classification: Full Thickness Without Exposed Support Structures Wound Margin: Flat and Intact Exudate Amount: Medium Exudate Type: Serosanguineous Exudate Color: red, brown Foul Odor After Cleansing: No Slough/Fibrino Yes Wound Bed Granulation Amount: Large (67-100%) Exposed Structure Granulation Quality: Red, Pink Fascia Exposed: No Necrotic Amount: Small (1-33%) Fat Layer (Subcutaneous Tissue) Exposed: Yes Necrotic Quality: Adherent Slough Tendon Exposed: No Muscle Exposed: No Joint Exposed: No Bone Exposed: No Treatment Notes Wound #1 (Malleolus) Wound Laterality: Right, Medial Cleanser Wound Cleanser Discharge Instruction: Cleanse the wound with wound cleanser prior to applying a clean dressing using gauze sponges, not tissue or cotton balls. Soap and Water Discharge Instruction: May shower and wash wound with dial antibacterial soap and water prior to dressing change. Peri-Wound Care Topical Primary Dressing KerraCel Ag Gelling Fiber Dressing, 4x5 in (silver alginate) Discharge Instruction: Apply silver alginate to wound bed as instructed Secondary Dressing ABD Pad, 5x9 Discharge Instruction: Apply over primary dressing as directed. Secured With Compression Wrap ThreePress (3 layer compression wrap) Discharge Instruction: Apply LIGHTY three layer compression as directed. Compression Stockings Add-Ons Electronic Signature(s) Signed: 06/19/2020 5:53:33 PM By: Sandre Kitty Signed: 06/19/2020  5:57:00 PM By: Baruch Gouty RN, BSN Entered By: Sandre Kitty on 06/19/2020 17:17:35 -------------------------------------------------------------------------------- Wound Assessment Details Patient Name: Date of Service: [name], [name] 06/19/2020 10:15 A M Medical Record Number: [id number] Patient Account Number: [id number] Date of Birth/Sex: Treating RN: 1941-02-14 (79 y.o. [name] Primary Care Voshon Petro: [name] Other Clinician: Referring Charod Slawinski: Treating Swan Zayed/Extender: [name] in Treatment: 1 Wound Status Wound Number: 2 Primary Venous Leg Ulcer Etiology: Wound Location: Right, Posterior Lower Leg Secondary Lymphedema Wounding Event:  Gradually Appeared Etiology: Date Acquired: 11/20/2019 Wound Open Weeks Of Treatment: 1 Status: Clustered Wound: No Comorbid Anemia, Chronic Obstructive Pulmonary Disease (COPD), History: Arrhythmia, Congestive Heart Failure, Coronary Artery Disease, Hypertension, Cirrhosis , Osteoarthritis Photos Wound Measurements Length: (cm) 1.5 Width: (cm) 1.5 Depth: (cm) 0.1 Area: (cm) 1.767 Volume: (cm) 0.177 % Reduction in Area: 33.8% % Reduction in Volume: 33.7% Epithelialization: Small (1-33%) Tunneling: No Undermining: No Wound Description Classification: Full Thickness Without Exposed Support Structures Wound Margin: Flat and Intact Exudate Amount: Medium Exudate Type: Serosanguineous Exudate Color: red, brown Foul Odor After Cleansing: No Slough/Fibrino Yes Wound Bed Granulation Amount: Small (1-33%) Exposed Structure Granulation Quality: Pink Fascia Exposed: No Necrotic Amount: Large (67-100%) Fat Layer (Subcutaneous Tissue) Exposed: Yes Necrotic Quality: Adherent Slough Tendon Exposed: No Muscle Exposed: No Joint Exposed: No Bone Exposed: No Treatment Notes Wound #2 (Lower Leg) Wound Laterality: Right, Posterior Cleanser Wound Cleanser Discharge  Instruction: Cleanse the wound with wound cleanser prior to applying a clean dressing using gauze sponges, not tissue or cotton balls. Soap and Water Discharge Instruction: May shower and wash wound with dial antibacterial soap and water prior to dressing change. Peri-Wound Care Topical Primary Dressing KerraCel Ag Gelling Fiber Dressing, 4x5 in (silver alginate) Discharge Instruction: Apply silver alginate to wound bed as instructed Secondary Dressing ABD Pad, 5x9 Discharge Instruction: Apply over primary dressing as directed. Secured With Compression Wrap ThreePress (3 layer compression wrap) Discharge Instruction: Apply LIGHTY three layer compression as directed. Compression Stockings Add-Ons Electronic Signature(s) Signed: 06/19/2020 5:53:33 PM By: Sandre Kitty Signed: 06/19/2020 5:57:00 PM By: Baruch Gouty RN, BSN Entered By: Sandre Kitty on 06/19/2020 17:18:27 -------------------------------------------------------------------------------- Wound Assessment Details Patient Name: Date of Service: [name], [name] 06/19/2020 10:15 A M Medical Record Number: [id number] Patient Account Number: [id number] Date of Birth/Sex: Treating RN: 1941-02-01 (80 y.o. [name] Primary Care Edrik Rundle: [name] Other Clinician: Referring Tracy Gerken: Treating Weda Baumgarner/Extender: [name] in Treatment: 1 Wound Status Wound Number: 3 Primary Abrasion Etiology: Wound Location: Right Knee Wound Open Wounding Event: Trauma Status: Date Acquired: 06/04/2020 Comorbid Anemia, Chronic Obstructive Pulmonary Disease (COPD), Weeks Of Treatment: 1 History: Arrhythmia, Congestive Heart Failure, Coronary Artery Disease, Clustered Wound: No Hypertension, Cirrhosis , Osteoarthritis Photos Wound Measurements Length: (cm) 1.3 Width: (cm) 1.3 Depth: (cm) 0.1 Area: (cm) 1.327 Volume: (cm) 0.133 % Reduction in Area: 72.8% % Reduction in  Volume: 72.7% Epithelialization: Small (1-33%) Tunneling: No Undermining: No Wound Description Classification: Full Thickness Without Exposed Support Structures Wound Margin: Flat and Intact Exudate Amount: Small Exudate Type: Serosanguineous Exudate Color: red, brown Foul Odor After Cleansing: No Slough/Fibrino No Wound Bed Granulation Amount: Large (67-100%) Exposed Structure Granulation Quality: Red, Pink Fascia Exposed: No Necrotic Amount: Small (1-33%) Fat Layer (Subcutaneous Tissue) Exposed: Yes Necrotic Quality: Adherent Slough Tendon Exposed: No Muscle Exposed: No Joint Exposed: No Bone Exposed: No Treatment Notes Wound #3 (Knee) Wound Laterality: Right Cleanser Wound Cleanser Discharge Instruction: Cleanse the wound with wound cleanser prior to applying a clean dressing using gauze sponges, not tissue or cotton balls. Soap and Water Discharge Instruction: May shower and wash wound with dial antibacterial soap and water prior to dressing change. Peri-Wound Care Topical Primary Dressing KerraCel Ag Gelling Fiber Dressing, 4x5 in (silver alginate) Discharge Instruction: Apply silver alginate to wound bed as instructed Secondary Dressing ComfortFoam Border, 4x4 in (silicone border) Discharge Instruction: Apply over primary dressing as directed. Secured With Compression Wrap Compression Stockings Environmental education officer) Signed: 06/19/2020 5:53:33 PM By: Sandre Kitty  Signed: 06/19/2020 5:57:00 PM By: Baruch Gouty RN, BSN Entered By: Sandre Kitty on 06/19/2020 17:17:13 -------------------------------------------------------------------------------- Wound Assessment Details Patient Name: Date of Service: [name], [name] 06/19/2020 10:15 A M Medical Record Number: [id number] Patient Account Number: [id number] Date of Birth/Sex: Treating RN: 08/02/1940 (80 y.o. [name] Primary Care Deaunna Olarte: [name] Other  Clinician: Referring Coulson Wehner: Treating Baily Serpe/Extender: [name] in Treatment: 1 Wound Status Wound Number: 4 Primary Lymphedema Etiology: Wound Location: Left, Dorsal Foot Wound Open Wounding Event: Blister Status: Date Acquired: 11/20/2019 Comorbid Anemia, Chronic Obstructive Pulmonary Disease (COPD), Weeks Of Treatment: 1 History: Arrhythmia, Congestive Heart Failure, Coronary Artery Disease, Clustered Wound: No Hypertension, Cirrhosis , Osteoarthritis Photos Wound Measurements Length: (cm) 1 Width: (cm) 2 Depth: (cm) 0.1 Area: (cm) 1.571 Volume: (cm) 0.157 % Reduction in Area: -9.9% % Reduction in Volume: -9.8% Epithelialization: None Tunneling: No Undermining: No Wound Description Classification: Full Thickness Without Exposed Support Structures Wound Margin: Flat and Intact Exudate Amount: Medium Exudate Type: Serosanguineous Exudate Color: red, brown Foul Odor After Cleansing: No Slough/Fibrino Yes Wound Bed Granulation Amount: Medium (34-66%) Exposed Structure Granulation Quality: Pink Fascia Exposed: No Necrotic Amount: Medium (34-66%) Fat Layer (Subcutaneous Tissue) Exposed: Yes Necrotic Quality: Adherent Slough Tendon Exposed: No Muscle Exposed: No Joint Exposed: No Bone Exposed: No Treatment Notes Wound #4 (Foot) Wound Laterality: Dorsal, Left Cleanser Wound Cleanser Discharge Instruction: Cleanse the wound with wound cleanser prior to applying a clean dressing using gauze sponges, not tissue or cotton balls. Soap and Water Discharge Instruction: May shower and wash wound with dial antibacterial soap and water prior to dressing change. Peri-Wound Care Topical Primary Dressing KerraCel Ag Gelling Fiber Dressing, 4x5 in (silver alginate) Discharge Instruction: Apply silver alginate to wound bed as instructed Secondary Dressing ABD Pad, 5x9 Discharge Instruction: Apply over primary dressing as  directed. Secured With Compression Wrap ThreePress (3 layer compression wrap) Discharge Instruction: Apply LIGHTY three layer compression as directed. Compression Stockings Add-Ons Electronic Signature(s) Signed: 06/19/2020 5:53:33 PM By: Sandre Kitty Signed: 06/19/2020 5:57:00 PM By: Baruch Gouty RN, BSN Entered By: Sandre Kitty on 06/19/2020 17:18:53 -------------------------------------------------------------------------------- Wound Assessment Details Patient Name: Date of Service: [name], [name] 06/19/2020 10:15 A M Medical Record Number: [id number] Patient Account Number: [id number] Date of Birth/Sex: Treating RN: 02-09-1941 (80 y.o. [name] Primary Care Geovanny Sartin: [name] Other Clinician: Referring Iola Turri: Treating Salome Cozby/Extender: [name] in Treatment: 1 Wound Status Wound Number: 5 Primary Lymphedema Etiology: Wound Location: Right, Lateral Foot Wound Healed - Epithelialized Wounding Event: Trauma Status: Date Acquired: 05/28/2020 Comorbid Anemia, Chronic Obstructive Pulmonary Disease (COPD), Weeks Of Treatment: 1 History: Arrhythmia, Congestive Heart Failure, Coronary Artery Disease, Clustered Wound: No Hypertension, Cirrhosis , Osteoarthritis Wound Measurements Length: (cm) Width: (cm) Depth: (cm) Area: (cm) Volume: (cm) 0 % Reduction in Area: 100% 0 % Reduction in Volume: 100% 0 Epithelialization: Large (67-100%) 0 Tunneling: No 0 Undermining: No Wound Description Classification: Full Thickness Without Exposed Support Structures Exudate Amount: None Present Foul Odor After Cleansing: No Slough/Fibrino No Wound Bed Granulation Amount: None Present (0%) Exposed Structure Necrotic Amount: None Present (0%) Fascia Exposed: No Fat Layer (Subcutaneous Tissue) Exposed: No Tendon Exposed: No Muscle Exposed: No Joint Exposed: No Bone Exposed: No Electronic Signature(s) Signed:  06/19/2020 5:57:00 PM By: Baruch Gouty RN, BSN Entered By: Baruch Gouty on 06/19/2020 11:28:27 -------------------------------------------------------------------------------- Wound Assessment Details Patient Name: Date of Service: [name], [name] 06/19/2020 10:15 A M Medical Record Number: [id number]  Patient Account Number: [id number] Date of Birth/Sex: Treating RN: 11-Sep-1940 (80 y.o. [name] Primary Care Yaneliz Radebaugh: [name] Other Clinician: Referring Liliani Bobo: Treating Ranette Luckadoo/Extender: [name] in Treatment: 1 Wound Status Wound Number: 6 Primary Lymphedema Etiology: Wound Location: Left, Posterior Lower Leg Secondary Venous Leg Ulcer Wounding Event: Gradually Appeared Etiology: Date Acquired: 11/20/2019 Wound Open Weeks Of Treatment: 1 Status: Clustered Wound: No Comorbid Anemia, Chronic Obstructive Pulmonary Disease (COPD), History: Arrhythmia, Congestive Heart Failure, Coronary Artery Disease, Hypertension, Cirrhosis , Osteoarthritis Photos Wound Measurements Length: (cm) 3.2 Width: (cm) 3.3 Depth: (cm) 0.1 Area: (cm) 8.294 Volume: (cm) 0.829 % Reduction in Area: 17.9% % Reduction in Volume: 18% Epithelialization: None Tunneling: No Undermining: No Wound Description Classification: Full Thickness Without Exposed Support Structures Wound Margin: Flat and Intact Exudate Amount: Medium Exudate Type: Purulent Exudate Color: yellow, brown, green Foul Odor After Cleansing: No Slough/Fibrino Yes Wound Bed Granulation Amount: None Present (0%) Exposed Structure Necrotic Amount: Large (67-100%) Fascia Exposed: No Necrotic Quality: Adherent Slough Fat Layer (Subcutaneous Tissue) Exposed: Yes Tendon Exposed: No Muscle Exposed: No Joint Exposed: No Bone Exposed: No Assessment Notes green drainage Treatment Notes Wound #6 (Lower Leg) Wound Laterality: Left, Posterior Cleanser Wound  Cleanser Discharge Instruction: Cleanse the wound with wound cleanser prior to applying a clean dressing using gauze sponges, not tissue or cotton balls. Soap and Water Discharge Instruction: May shower and wash wound with dial antibacterial soap and water prior to dressing change. Peri-Wound Care Topical Primary Dressing KerraCel Ag Gelling Fiber Dressing, 4x5 in (silver alginate) Discharge Instruction: Apply silver alginate to wound bed as instructed Secondary Dressing ABD Pad, 5x9 Discharge Instruction: Apply over primary dressing as directed. Secured With Compression Wrap ThreePress (3 layer compression wrap) Discharge Instruction: Apply LIGHTY three layer compression as directed. Compression Stockings Add-Ons Electronic Signature(s) Signed: 06/19/2020 5:53:33 PM By: Sandre Kitty Signed: 06/19/2020 5:57:00 PM By: Baruch Gouty RN, BSN Entered By: Sandre Kitty on 06/19/2020 17:19:46 -------------------------------------------------------------------------------- Wound Assessment Details Patient Name: Date of Service: [name], [name] 06/19/2020 10:15 A M Medical Record Number: [id number] Patient Account Number: [id number] Date of Birth/Sex: Treating RN: 1941-01-11 (80 y.o. [name] Primary Care Wilhelmina Hark: [name] Other Clinician: Referring Steffan Caniglia: Treating Nayelis Bonito/Extender: [name] in Treatment: 1 Wound Status Wound Number: 7 Primary Lymphedema Etiology: Wound Location: Left, Proximal, Anterior Lower Leg Wound Open Wounding Event: Trauma Status: Date Acquired: 06/04/2020 Comorbid Anemia, Chronic Obstructive Pulmonary Disease (COPD), Weeks Of Treatment: 1 History: Arrhythmia, Congestive Heart Failure, Coronary Artery Disease, Clustered Wound: No Hypertension, Cirrhosis , Osteoarthritis Photos Wound Measurements Length: (cm) 1 Width: (cm) 1.1 Depth: (cm) 0.1 Area: (cm) 0.864 Volume: (cm) 0.086 %  Reduction in Area: 47.1% % Reduction in Volume: 47.2% Epithelialization: Small (1-33%) Tunneling: No Undermining: No Wound Description Classification: Full Thickness Without Exposed Support Structures Wound Margin: Flat and Intact Exudate Amount: Medium Exudate Type: Serosanguineous Exudate Color: red, brown Foul Odor After Cleansing: No Slough/Fibrino Yes Wound Bed Granulation Amount: Small (1-33%) Exposed Structure Granulation Quality: Pink Fascia Exposed: No Necrotic Amount: Large (67-100%) Fat Layer (Subcutaneous Tissue) Exposed: Yes Necrotic Quality: Adherent Slough Tendon Exposed: No Muscle Exposed: No Joint Exposed: No Bone Exposed: No Treatment Notes Wound #7 (Lower Leg) Wound Laterality: Left, Anterior, Proximal Cleanser Wound Cleanser Discharge Instruction: Cleanse the wound with wound cleanser prior to applying a clean dressing using gauze sponges, not tissue or cotton balls. Soap and Water Discharge Instruction: May shower and wash wound with dial antibacterial soap and  water prior to dressing change. Peri-Wound Care Topical Primary Dressing KerraCel Ag Gelling Fiber Dressing, 4x5 in (silver alginate) Discharge Instruction: Apply silver alginate to wound bed as instructed Secondary Dressing ABD Pad, 5x9 Discharge Instruction: Apply over primary dressing as directed. Secured With Compression Wrap ThreePress (3 layer compression wrap) Discharge Instruction: Apply LIGHTY three layer compression as directed. Compression Stockings Add-Ons Electronic Signature(s) Signed: 06/19/2020 5:53:33 PM By: Sandre Kitty Signed: 06/19/2020 5:57:00 PM By: Baruch Gouty RN, BSN Entered By: Sandre Kitty on 06/19/2020 17:19:23 -------------------------------------------------------------------------------- Wound Assessment Details Patient Name: Date of Service: [name], [name] 06/19/2020 10:15 A M Medical Record Number: [id number] Patient Account Number:  [id number] Date of Birth/Sex: Treating RN: May 11, 1940 (80 y.o. [name] Primary Care Harless Molinari: [name] Other Clinician: Referring Renelda Kilian: Treating Timon Geissinger/Extender: [name] in Treatment: 1 Wound Status Wound Number: 8 Primary Pressure Ulcer Etiology: Wound Location: Left, Posterior Upper Leg Wound Open Wounding Event: Pressure Injury Status: Date Acquired: 05/23/2019 Comorbid Anemia, Chronic Obstructive Pulmonary Disease (COPD), Weeks Of Treatment: 1 History: Arrhythmia, Congestive Heart Failure, Coronary Artery Disease, Clustered Wound: No Hypertension, Cirrhosis , Osteoarthritis Photos Wound Measurements Length: (cm) 0.5 Width: (cm) 0.5 Depth: (cm) 0.1 Area: (cm) 0.196 Volume: (cm) 0.02 % Reduction in Area: 94.2% % Reduction in Volume: 94.1% Epithelialization: Medium (34-66%) Tunneling: No Undermining: No Wound Description Classification: Category/Stage II Wound Margin: Flat and Intact Exudate Amount: Small Exudate Type: Serous Exudate Color: amber Foul Odor After Cleansing: No Slough/Fibrino No Wound Bed Granulation Amount: Large (67-100%) Exposed Structure Granulation Quality: Pink Fascia Exposed: No Necrotic Amount: None Present (0%) Fat Layer (Subcutaneous Tissue) Exposed: Yes Tendon Exposed: No Muscle Exposed: No Joint Exposed: No Bone Exposed: No Treatment Notes Wound #8 (Upper Leg) Wound Laterality: Left, Posterior Cleanser Wound Cleanser Discharge Instruction: Cleanse the wound with wound cleanser prior to applying a clean dressing using gauze sponges, not tissue or cotton balls. Soap and Water Discharge Instruction: May shower and wash wound with dial antibacterial soap and water prior to dressing change. Peri-Wound Care Topical Primary Dressing KerraCel Ag Gelling Fiber Dressing, 4x5 in (silver alginate) Discharge Instruction: Apply silver alginate to wound bed as instructed Secondary  Dressing ComfortFoam Border, 4x4 in (silicone border) Discharge Instruction: Apply over primary dressing as directed. Secured With Compression Wrap Compression Stockings Environmental education officer) Signed: 06/19/2020 5:53:33 PM By: Sandre Kitty Signed: 06/19/2020 5:57:00 PM By: Baruch Gouty RN, BSN Entered By: Sandre Kitty on 06/19/2020 17:20:16 -------------------------------------------------------------------------------- Wound Assessment Details Patient Name: Date of Service: [name], [name] 06/19/2020 10:15 A M Medical Record Number: [id number] Patient Account Number: [id number] Date of Birth/Sex: Treating RN: 06/14/1940 (80 y.o. [name] Primary Care Orian Figueira: [name] Other Clinician: Referring Denisha Hoel: Treating Shaterra Sanzone/Extender: [name] in Treatment: 1 Wound Status Wound Number: 9 Primary Pressure Ulcer Etiology: Wound Location: Left, Proximal, Posterior Upper Leg Wound Open Wounding Event: Pressure Injury Status: Date Acquired: 05/23/2019 Comorbid Anemia, Chronic Obstructive Pulmonary Disease (COPD), Weeks Of Treatment: 1 Weeks Of Treatment: 1 History: Arrhythmia, Congestive Heart Failure, Coronary Artery Disease, Clustered Wound: No Hypertension, Cirrhosis , Osteoarthritis Photos Wound Measurements Length: (cm) 2 Width: (cm) 2 Depth: (cm) 0.1 Area: (cm) 3.142 Volume: (cm) 0.314 % Reduction in Area: -900.6% % Reduction in Volume: -912.9% Epithelialization: Small (1-33%) Tunneling: No Undermining: No Wound Description Classification: Category/Stage II Wound Margin: Flat and Intact Exudate Amount: Medium Exudate Type: Serosanguineous Exudate Color: red, brown Foul Odor After Cleansing: No Slough/Fibrino Yes Wound Bed Granulation Amount:  Medium (34-66%) Exposed Structure Granulation Quality: Pink Fascia Exposed: No Necrotic Amount: Medium (34-66%) Fat Layer (Subcutaneous Tissue)  Exposed: Yes Necrotic Quality: Adherent Slough Tendon Exposed: No Muscle Exposed: No Joint Exposed: No Bone Exposed: No Treatment Notes Wound #9 (Upper Leg) Wound Laterality: Left, Posterior, Proximal Cleanser Wound Cleanser Discharge Instruction: Cleanse the wound with wound cleanser prior to applying a clean dressing using gauze sponges, not tissue or cotton balls. Soap and Water Discharge Instruction: May shower and wash wound with dial antibacterial soap and water prior to dressing change. Peri-Wound Care Topical Primary Dressing KerraCel Ag Gelling Fiber Dressing, 4x5 in (silver alginate) Discharge Instruction: Apply silver alginate to wound bed as instructed Secondary Dressing ComfortFoam Border, 4x4 in (silicone border) Discharge Instruction: Apply over primary dressing as directed. Secured With Compression Wrap Compression Stockings Environmental education officer) Signed: 06/19/2020 5:53:33 PM By: Sandre Kitty Signed: 06/19/2020 5:57:00 PM By: Baruch Gouty RN, BSN Entered By: Sandre Kitty on 06/19/2020 17:20:35 -------------------------------------------------------------------------------- Vitals Details Patient Name: Date of Service: [name], [name] 06/19/2020 10:15 A M Medical Record Number: [id number] Patient Account Number: [id number] Date of Birth/Sex: Treating RN: [birth year] (80 y.o. [name] Primary Care Merrill Villarruel: [name] Other Clinician: Referring Melyssa Signor: Treating Varina Hulon/Extender: [name] in Treatment: 1 Vital Signs Time Taken: 11:14 Temperature (F): 98.5 Height (in): 71 Pulse (bpm): 75 Source: Stated Respiratory Rate (breaths/min): 20 Weight (lbs): 178 Blood Pressure (mmHg): 130/64 Source: Stated Reference Range: 80 - 120 mg / dl Body Mass Index (BMI): 24.8 Electronic Signature(s) Signed: 06/19/2020 5:57:00 PM By: Baruch Gouty RN, BSN Entered By: Baruch Gouty on  06/19/2020 11:15:20

## 2020-06-19 NOTE — Progress Notes (Signed)
Estey, TRIGGER FRASIER ([id number]) Visit Report for 06/19/2020 HPI Details Patient Name: Date of Service: [name], [name] 06/19/2020 10:15 A M Medical Record Number: [id number] Patient Account Number: [id number] Date of Birth/Sex: Treating RN: [birth year] (80 y.o. [name], [name] Primary Care Provider: [name] Other Clinician: Referring Provider: Treating Provider/Extender: [name] in Treatment: 1 History of Present Illness HPI Description: ADMISSION 06/12/2020 This is a 80 year old man who has had wounds on his bilateral lower legs and feet for several months now. Looking in epic he saw his primary physician and early November at which time he had an ulcer of the right calf. He was seen again by his primary doctor on 05/12/2020 at which time he had bilateral lower extremity ulcers. He was reviewed by Dr. Trula Slade of vein and vascular in the spring 2021. He had arterial studies done on 08/12/2019 showing an ABI on the right and 0.74 on the left and 0.69. TBI of the right of 0.29 on the left 0.35 with monophasic waveforms bilaterally. He also had venous reflux studies on 09/29/2019 that did not show evidence of DVT or SVT bilaterally. In the right he had superficial vein reflux in the SSV and mid thigh AASV greater saphenous vein had previously been harvested. On the left he did not have any superficial vein reflux but it was noted that he had interstitial fluid without throughout the lower extremity. He also has a history of CABG, congestive heart failure followed by Dr. Stanford Breed and apparently liver cirrhosis. He takes Lasix 80 mg as well as spironolactone. He arrives in clinic today with 9 wounds. Most of these are superficial and look like they started with blisters. He has areas on the left dorsal foot x2, left posterior calf left anterior tibial left posterior thigh x2 the right medial lower extremity and the right medial malleolus. He has pitting  edema extending well up into his thighs skin changes look like chronic stasis dermatitis. Dr. Trula Slade saw him in May noted that he had tolerated Unna boots and was feeling better. Also noted the moderate arterial insufficiency and suggested if he developed nonhealing wounds then he may need attempted angiography. Past medical history extensive including multiple left hip surgeries, congestive heart failure, cirrhosis, peripheral arterial disease, chronic atrial fibrillation, recurrent cellulitis of the legs, AAA, carotid stenosis, COPD, hypertension and hyperlipidemia We did not attempt his arterial ABIs in our clinic today because of swelling 3/1; this is a patient I admitted to the clinic last week he has multiple wounds on his bilateral lower extremities. I think he has chronic venous insufficiency and PAD. He tolerated the "light" 3 layer wrap I put him on last week and the edema control is better. Electronic Signature(s) Signed: 06/19/2020 5:59:48 PM By: Linton Ham MD Entered By: Linton Ham on 06/19/2020 17:52:10 -------------------------------------------------------------------------------- Physical Exam Details Patient Name: Date of Service: [name], [name] 06/19/2020 10:15 A M Medical Record Number: [id number] Patient Account Number: [id number] Date of Birth/Sex: Treating RN: 1940/04/23 (80 y.o. [name] Primary Care Provider: [name] Other Clinician: Referring Provider: Treating Provider/Extender: [name] in Treatment: 1 Constitutional Sitting or standing Blood Pressure is within target range for patient.. Pulse regular and within target range for patient.[name] Respirations regular, non-labored and within target range.. Temperature is normal and within the target range for the patient.[name] Appears in no distress. Cardiovascular Pedal pulses were difficult to feel. Edema control is better. Notes Wound exam; as noted he  has nonwounds in our clinic. Much of this looks the same this week he has 2 on the left posterior thighs large area on the left posterior calf. Left anterior tibia left dorsal foot and the right medial lower extremity and medial malleolus. Most of these look the same some of them look cleaner. His edema control is a lot better. Nothing looked infected Electronic Signature(s) Signed: 06/19/2020 5:59:48 PM By: Linton Ham MD Entered By: Linton Ham on 06/19/2020 17:53:51 -------------------------------------------------------------------------------- Physician Orders Details Patient Name: Date of Service: [name], [name] 06/19/2020 10:15 A M Medical Record Number: [id number] Patient Account Number: [id number] Date of Birth/Sex: Treating RN: [birth year] (80 y.o. [name] Primary Care Provider: [name] Other Clinician: Referring Provider: Treating Provider/Extender: [name] in Treatment: 1 Verbal / Phone Orders: No Diagnosis Coding Follow-up Appointments Return Appointment in 1 week. Nurse Visit: - on Friday Bathing/ Shower/ Hygiene May shower with protection but do not get wound dressing(s) wet. - May use cast protectors over wraps. You can find these at [facility] or [facility] Edema Control - Lymphedema / SCD / Other Elevate legs to the level of the heart or above for 30 minutes daily and/or when sitting, a frequency of: Avoid standing for long periods of time. Non Wound Condition Protect area with: - Cover scrotal area with zinc oxide and cover with ABD pads Home Health dmit to [facility] for wound care. May utilize formulary equivalent dressing for wound treatment orders unless otherwise specified. - A [facility] Wound Treatment Wound #1 - Malleolus Wound Laterality: Right, Medial Cleanser: Wound Cleanser 2 x Per Week/15 Days Discharge Instructions: Cleanse the wound with wound cleanser prior to applying a clean dressing  using gauze sponges, not tissue or cotton balls. Cleanser: Soap and Water 2 x Per Week/15 Days Discharge Instructions: May shower and wash wound with dial antibacterial soap and water prior to dressing change. Prim Dressing: KerraCel Ag Gelling Fiber Dressing, 4x5 in (silver alginate) 2 x Per Week/15 Days ary Discharge Instructions: Apply silver alginate to wound bed as instructed Secondary Dressing: ABD Pad, 5x9 2 x Per Week/15 Days Discharge Instructions: Apply over primary dressing as directed. Compression Wrap: ThreePress (3 layer compression wrap) 2 x Per Week/15 Days Discharge Instructions: Apply LIGHTY three layer compression as directed. Wound #2 - Lower Leg Wound Laterality: Right, Posterior Cleanser: Wound Cleanser 2 x Per Week/15 Days Discharge Instructions: Cleanse the wound with wound cleanser prior to applying a clean dressing using gauze sponges, not tissue or cotton balls. Cleanser: Soap and Water 2 x Per Week/15 Days Discharge Instructions: May shower and wash wound with dial antibacterial soap and water prior to dressing change. Prim Dressing: KerraCel Ag Gelling Fiber Dressing, 4x5 in (silver alginate) 2 x Per Week/15 Days ary Discharge Instructions: Apply silver alginate to wound bed as instructed Secondary Dressing: ABD Pad, 5x9 2 x Per Week/15 Days Discharge Instructions: Apply over primary dressing as directed. Compression Wrap: ThreePress (3 layer compression wrap) 2 x Per Week/15 Days Discharge Instructions: Apply LIGHTY three layer compression as directed. Wound #3 - Knee Wound Laterality: Right Cleanser: Wound Cleanser 2 x Per Week/15 Days Discharge Instructions: Cleanse the wound with wound cleanser prior to applying a clean dressing using gauze sponges, not tissue or cotton balls. Cleanser: Soap and Water 2 x Per Week/15 Days Discharge Instructions: May shower and wash wound with dial antibacterial soap and water prior to dressing change. Prim Dressing:  KerraCel Ag Gelling Fiber Dressing, 4x5 in (silver  alginate) 2 x Per Week/15 Days ary Discharge Instructions: Apply silver alginate to wound bed as instructed Secondary Dressing: ComfortFoam Border, 4x4 in (silicone border) 2 x Per Week/15 Days Discharge Instructions: Apply over primary dressing as directed. Wound #4 - Foot Wound Laterality: Dorsal, Left Cleanser: Wound Cleanser 2 x Per Week/15 Days Discharge Instructions: Cleanse the wound with wound cleanser prior to applying a clean dressing using gauze sponges, not tissue or cotton balls. Cleanser: Soap and Water 2 x Per Week/15 Days Discharge Instructions: May shower and wash wound with dial antibacterial soap and water prior to dressing change. Prim Dressing: KerraCel Ag Gelling Fiber Dressing, 4x5 in (silver alginate) 2 x Per Week/15 Days ary Discharge Instructions: Apply silver alginate to wound bed as instructed Secondary Dressing: ABD Pad, 5x9 2 x Per Week/15 Days Discharge Instructions: Apply over primary dressing as directed. Compression Wrap: ThreePress (3 layer compression wrap) 2 x Per Week/15 Days Discharge Instructions: Apply LIGHTY three layer compression as directed. Wound #6 - Lower Leg Wound Laterality: Left, Posterior Cleanser: Wound Cleanser 2 x Per Week/15 Days Discharge Instructions: Cleanse the wound with wound cleanser prior to applying a clean dressing using gauze sponges, not tissue or cotton balls. Cleanser: Soap and Water 2 x Per Week/15 Days Discharge Instructions: May shower and wash wound with dial antibacterial soap and water prior to dressing change. Prim Dressing: KerraCel Ag Gelling Fiber Dressing, 4x5 in (silver alginate) 2 x Per Week/15 Days ary Discharge Instructions: Apply silver alginate to wound bed as instructed Secondary Dressing: ABD Pad, 5x9 2 x Per Week/15 Days Discharge Instructions: Apply over primary dressing as directed. Compression Wrap: ThreePress (3 layer compression wrap) 2 x Per  Week/15 Days Discharge Instructions: Apply LIGHTY three layer compression as directed. Wound #7 - Lower Leg Wound Laterality: Left, Anterior, Proximal Cleanser: Wound Cleanser 2 x Per Week/15 Days Discharge Instructions: Cleanse the wound with wound cleanser prior to applying a clean dressing using gauze sponges, not tissue or cotton balls. Cleanser: Soap and Water 2 x Per Week/15 Days Discharge Instructions: May shower and wash wound with dial antibacterial soap and water prior to dressing change. Prim Dressing: KerraCel Ag Gelling Fiber Dressing, 4x5 in (silver alginate) 2 x Per Week/15 Days ary Discharge Instructions: Apply silver alginate to wound bed as instructed Secondary Dressing: ABD Pad, 5x9 2 x Per Week/15 Days Discharge Instructions: Apply over primary dressing as directed. Compression Wrap: ThreePress (3 layer compression wrap) 2 x Per Week/15 Days Discharge Instructions: Apply LIGHTY three layer compression as directed. Wound #8 - Upper Leg Wound Laterality: Left, Posterior Cleanser: Wound Cleanser 2 x Per Week/15 Days Discharge Instructions: Cleanse the wound with wound cleanser prior to applying a clean dressing using gauze sponges, not tissue or cotton balls. Cleanser: Soap and Water 2 x Per Week/15 Days Discharge Instructions: May shower and wash wound with dial antibacterial soap and water prior to dressing change. Prim Dressing: KerraCel Ag Gelling Fiber Dressing, 4x5 in (silver alginate) 2 x Per Week/15 Days ary Discharge Instructions: Apply silver alginate to wound bed as instructed Secondary Dressing: ComfortFoam Border, 4x4 in (silicone border) 2 x Per Week/15 Days Discharge Instructions: Apply over primary dressing as directed. Wound #9 - Upper Leg Wound Laterality: Left, Posterior, Proximal Cleanser: Wound Cleanser 2 x Per Week/15 Days Discharge Instructions: Cleanse the wound with wound cleanser prior to applying a clean dressing using gauze sponges, not tissue or  cotton balls. Cleanser: Soap and Water 2 x Per Week/15 Days Discharge Instructions: May shower and  wash wound with dial antibacterial soap and water prior to dressing change. Prim Dressing: KerraCel Ag Gelling Fiber Dressing, 4x5 in (silver alginate) 2 x Per Week/15 Days ary Discharge Instructions: Apply silver alginate to wound bed as instructed Secondary Dressing: ComfortFoam Border, 4x4 in (silicone border) 2 x Per Week/15 Days Discharge Instructions: Apply over primary dressing as directed. Electronic Signature(s) Signed: 06/19/2020 5:58:27 PM By: [name] RN Signed: 06/19/2020 5:59:48 PM By: Linton Ham MD Entered By: [name] on 06/19/2020 12:04:07 -------------------------------------------------------------------------------- Problem List Details Patient Name: Date of Service: [name], [name] 06/19/2020 10:15 A M Medical Record Number: [id number] Patient Account Number: [id number] Date of Birth/Sex: Treating RN: [birth year] (80 y.o. 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, bilateral legs 06/12/2020 No Yes L97.528 Non-pressure chronic ulcer of other part of left foot with other specified 06/12/2020 No Yes severity L97.228 Non-pressure chronic ulcer of left calf with other specified severity 06/12/2020 No Yes L97.128 Non-pressure chronic ulcer of left thigh with other specified severity 06/12/2020 No Yes L97.818 Non-pressure chronic ulcer of other part of right lower leg with other specified 06/12/2020 No Yes severity Inactive Problems Resolved  Problems Electronic Signature(s) Signed: 06/19/2020 5:59:48 PM By: Linton Ham MD Entered By: Linton Ham on 06/19/2020 17:46:07 -------------------------------------------------------------------------------- Progress Note Details Patient Name: Date of Service: [name], [name] 06/19/2020 10:15 A M Medical Record Number: [id number] Patient Account Number: [id number] Date of Birth/Sex: Treating RN: Dec 13, 1940 (80 y.o. [name], [name] Primary Care Provider: [name] Other Clinician: Referring Provider: Treating Provider/Extender: [name] in Treatment: 1 Subjective History of Present Illness (HPI) ADMISSION 06/12/2020 This is a 80 year old man who has had wounds on his bilateral lower legs and feet for several months now. Looking in epic he saw his primary physician and early November at which time he had an ulcer of the right calf. He was seen again by his primary doctor on 05/12/2020 at which time he had bilateral lower extremity ulcers. He was reviewed by Dr. Trula Slade of vein and vascular in the spring 2021. He had arterial studies done on 08/12/2019 showing an ABI on the right and 0.74 on the left and 0.69. TBI of the right of 0.29 on the left 0.35 with monophasic waveforms bilaterally. He also had venous reflux studies on 09/29/2019 that did not show evidence of DVT or SVT bilaterally. In the right he had superficial vein reflux in the SSV and mid thigh AASV greater saphenous vein had previously been harvested. On the left he did not have any superficial vein reflux but it was noted that he had interstitial fluid without throughout the lower extremity. He also has a history of CABG, congestive heart failure followed by Dr. Stanford Breed and apparently liver cirrhosis. He takes Lasix 80 mg as well as spironolactone. He arrives in clinic today with 9 wounds. Most of these are superficial and look like they started with blisters. He has areas  on the left dorsal foot x2, left posterior calf left anterior tibial left posterior thigh x2 the right medial lower extremity and the right medial malleolus. He has pitting edema extending well up into his thighs skin changes look like chronic stasis dermatitis. Dr. Trula Slade saw  him in May noted that he had tolerated Unna boots and was feeling better. Also noted the moderate arterial insufficiency and suggested if he developed nonhealing wounds then he may need attempted angiography. Past medical history extensive including multiple left hip surgeries, congestive heart failure, cirrhosis, peripheral arterial disease, chronic atrial fibrillation, recurrent cellulitis of the legs, AAA, carotid stenosis, COPD, hypertension and hyperlipidemia We did not attempt his arterial ABIs in our clinic today because of swelling 3/1; this is a patient I admitted to the clinic last week he has multiple wounds on his bilateral lower extremities. I think he has chronic venous insufficiency and PAD. He tolerated the "light" 3 layer wrap I put him on last week and the edema control is better. Objective Constitutional Sitting or standing Blood Pressure is within target range for patient.. Pulse regular and within target range for patient.[name] Respirations regular, non-labored and within target range.. Temperature is normal and within the target range for the patient.[name] Appears in no distress. Vitals Time Taken: 11:14 AM, Height: 71 in, Source: Stated, Weight: 178 lbs, Source: Stated, BMI: 24.8, Temperature: 98.5 F, Pulse: 75 bpm, Respiratory Rate: 20 breaths/min, Blood Pressure: 130/64 mmHg. Cardiovascular Pedal pulses were difficult to feel. Edema control is better. General Notes: Wound exam; as noted he has nonwounds in our clinic. Much of this looks the same this week he has 2 on the left posterior thighs large area on the left posterior calf. Left anterior tibia left dorsal foot and the right medial lower extremity  and medial malleolus. Most of these look the same some of them look cleaner. His edema control is a lot better. Nothing looked infected Integumentary (Hair, Skin) Wound #1 status is Open. Original cause of wound was Gradually Appeared. The date acquired was: 11/20/2019. The wound has been in treatment 1 weeks. The wound is located on the Right,Medial Malleolus. The wound measures 3.7cm length x 3.2cm width x 0.1cm depth; 9.299cm^2 area and 0.93cm^3 volume. There is Fat Layer (Subcutaneous Tissue) exposed. There is no tunneling or undermining noted. There is a medium amount of serosanguineous drainage noted. The wound margin is flat and intact. There is large (67-100%) red, pink granulation within the wound bed. There is a small (1-33%) amount of necrotic tissue within the wound bed including Adherent Slough. Wound #2 status is Open. Original cause of wound was Gradually Appeared. The date acquired was: 11/20/2019. The wound has been in treatment 1 weeks. The wound is located on the Right,Posterior Lower Leg. The wound measures 1.5cm length x 1.5cm width x 0.1cm depth; 1.767cm^2 area and 0.177cm^3 volume. There is Fat Layer (Subcutaneous Tissue) exposed. There is no tunneling or undermining noted. There is a medium amount of serosanguineous drainage noted. The wound margin is flat and intact. There is small (1-33%) pink granulation within the wound bed. There is a large (67-100%) amount of necrotic tissue within the wound bed including Adherent Slough. Wound #3 status is Open. Original cause of wound was Trauma. The date acquired was: 06/04/2020. The wound has been in treatment 1 weeks. The wound is located on the Right Knee. The wound measures 1.3cm length x 1.3cm width x 0.1cm depth; 1.327cm^2 area and 0.133cm^3 volume. There is Fat Layer (Subcutaneous Tissue) exposed. There is no tunneling or undermining noted. There is a small amount of serosanguineous drainage noted. The wound margin is flat and  intact. There is large (67-100%) red, pink granulation within the wound bed. There is a small (1-33%) amount of necrotic tissue within the  wound bed including Adherent Slough. Wound #4 status is Open. Original cause of wound was Blister. The date acquired was: 11/20/2019. The wound has been in treatment 1 weeks. The wound is located on the Left,Dorsal Foot. The wound measures 1cm length x 2cm width x 0.1cm depth; 1.571cm^2 area and 0.157cm^3 volume. There is Fat Layer (Subcutaneous Tissue) exposed. There is no tunneling or undermining noted. There is a medium amount of serosanguineous drainage noted. The wound margin is flat and intact. There is medium (34-66%) pink granulation within the wound bed. There is a medium (34-66%) amount of necrotic tissue within the wound bed including Adherent Slough. Wound #5 status is Healed - Epithelialized. Original cause of wound was Trauma. The date acquired was: 05/28/2020. The wound has been in treatment 1 weeks. The wound is located on the Right,Lateral Foot. The wound measures 0cm length x 0cm width x 0cm depth; 0cm^2 area and 0cm^3 volume. There is no tunneling or undermining noted. There is a none present amount of drainage noted. There is no granulation within the wound bed. There is no necrotic tissue within the wound bed. Wound #6 status is Open. Original cause of wound was Gradually Appeared. The date acquired was: 11/20/2019. The wound has been in treatment 1 weeks. The wound is located on the Left,Posterior Lower Leg. The wound measures 3.2cm length x 3.3cm width x 0.1cm depth; 8.294cm^2 area and 0.829cm^3 volume. There is Fat Layer (Subcutaneous Tissue) exposed. There is no tunneling or undermining noted. There is a medium amount of purulent drainage noted. The wound margin is flat and intact. There is no granulation within the wound bed. There is a large (67-100%) amount of necrotic tissue within the wound bed including Adherent Slough. General Notes:  green drainage Wound #7 status is Open. Original cause of wound was Trauma. The date acquired was: 06/04/2020. The wound has been in treatment 1 weeks. The wound is located on the Left,Proximal,Anterior Lower Leg. The wound measures 1cm length x 1.1cm width x 0.1cm depth; 0.864cm^2 area and 0.086cm^3 volume. There is Fat Layer (Subcutaneous Tissue) exposed. There is no tunneling or undermining noted. There is a medium amount of serosanguineous drainage noted. The wound margin is flat and intact. There is small (1-33%) pink granulation within the wound bed. There is a large (67-100%) amount of necrotic tissue within the wound bed including Adherent Slough. Wound #8 status is Open. Original cause of wound was Pressure Injury. The date acquired was: 05/23/2019. The wound has been in treatment 1 weeks. The wound is located on the Left,Posterior Upper Leg. The wound measures 0.5cm length x 0.5cm width x 0.1cm depth; 0.196cm^2 area and 0.02cm^3 volume. There is Fat Layer (Subcutaneous Tissue) exposed. There is no tunneling or undermining noted. There is a small amount of serous drainage noted. The wound margin is flat and intact. There is large (67-100%) pink granulation within the wound bed. There is no necrotic tissue within the wound bed. Wound #9 status is Open. Original cause of wound was Pressure Injury. The date acquired was: 05/23/2019. The wound has been in treatment 1 weeks. The wound is located on the Left,Proximal,Posterior Upper Leg. The wound measures 2cm length x 2cm width x 0.1cm depth; 3.142cm^2 area and 0.314cm^3 volume. There is Fat Layer (Subcutaneous Tissue) exposed. There is no tunneling or undermining noted. There is a medium amount of serosanguineous drainage noted. The wound margin is flat and intact. There is medium (34-66%) pink granulation within the wound bed. There is a medium (34-66%)  amount of necrotic tissue within the wound bed including Adherent Slough. 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 There was a Three Layer Compression Therapy Procedure by [name], RN. Post procedure Diagnosis Wound #1: Same as Pre-Procedure Wound #2 Pre-procedure diagnosis of Wound #2 is a Venous Leg Ulcer located on the Right,Posterior Lower Leg . There was a Three Layer Compression Therapy Procedure by [name], RN. Post procedure Diagnosis Wound #2: Same as Pre-Procedure Wound #4 Pre-procedure diagnosis of Wound #4 is a Lymphedema located on the Left,Dorsal Foot . There was a Three Layer Compression Therapy Procedure by [name], RN. Post procedure Diagnosis Wound #4: Same as Pre-Procedure Wound #6 Pre-procedure diagnosis of Wound #6 is a Lymphedema located on the Left,Posterior Lower Leg . There was a Three Layer Compression Therapy Procedure by [name], RN. Post procedure Diagnosis Wound #6: Same as Pre-Procedure Wound #7 Pre-procedure diagnosis of Wound #7 is a Lymphedema located on the Left,Proximal,Anterior Lower Leg . There was a Three Layer Compression Therapy Procedure by [name], RN. Post procedure Diagnosis Wound #7: Same as Pre-Procedure Plan Follow-up Appointments: Return Appointment in 1 week. Nurse Visit: - on Friday Bathing/ Shower/ Hygiene: May shower with protection but do not get wound dressing(s) wet. - May use  cast protectors over wraps. You can find these at [facility] or [facility] Edema Control - Lymphedema / SCD / Other: Elevate legs to the level of the heart or above for 30 minutes daily and/or when sitting, a frequency of: Avoid standing for long periods of time. Non Wound Condition: Protect area with: - Cover scrotal area with zinc oxide and cover with ABD pads Home Health: Admit to [facility] for wound care. May utilize formulary equivalent dressing for wound treatment orders unless otherwise specified. Jackquline [place] WOUND #1: - Malleolus Wound Laterality: Right, Medial Cleanser: Wound Cleanser 2 x Per Week/15 Days Discharge Instructions: Cleanse the wound with wound cleanser prior to applying a clean dressing using gauze sponges, not tissue or cotton balls. Cleanser: Soap and Water 2 x Per Week/15 Days Discharge Instructions: May shower and wash wound with dial antibacterial soap and water prior to dressing change. Prim Dressing: KerraCel Ag Gelling Fiber Dressing, 4x5 in (silver alginate) 2 x Per Week/15 Days ary Discharge Instructions: Apply silver alginate to wound bed as instructed Secondary Dressing: ABD Pad, 5x9 2 x Per Week/15 Days Discharge Instructions: Apply over primary dressing as directed. Com pression Wrap: ThreePress (3 layer compression wrap) 2 x Per Week/15 Days Discharge Instructions: Apply LIGHTY three layer compression as directed. WOUND #2: - Lower Leg Wound Laterality: Right, Posterior Cleanser: Wound Cleanser 2 x Per Week/15 Days Discharge Instructions: Cleanse the wound with wound cleanser prior to applying a clean dressing using gauze sponges, not tissue or cotton balls. Cleanser: Soap and Water 2 x Per Week/15 Days Discharge Instructions: May shower and wash wound with dial antibacterial soap and water prior to dressing change. Prim Dressing: KerraCel Ag Gelling Fiber Dressing, 4x5 in (silver alginate) 2 x  Per Week/15 Days ary Discharge Instructions: Apply silver  alginate to wound bed as instructed Secondary Dressing: ABD Pad, 5x9 2 x Per Week/15 Days Discharge Instructions: Apply over primary dressing as directed. Com pression Wrap: ThreePress (3 layer compression wrap) 2 x Per Week/15 Days Discharge Instructions: Apply LIGHTY three layer compression as directed. WOUND #3: - Knee Wound Laterality: Right Cleanser: Wound Cleanser 2 x Per Week/15 Days Discharge Instructions: Cleanse the wound with wound cleanser prior to applying a clean dressing using gauze sponges, not tissue or cotton balls. Cleanser: Soap and Water 2 x Per Week/15 Days Discharge Instructions: May shower and wash wound with dial antibacterial soap and water prior to dressing change. Prim Dressing: KerraCel Ag Gelling Fiber Dressing, 4x5 in (silver alginate) 2 x Per Week/15 Days ary Discharge Instructions: Apply silver alginate to wound bed as instructed Secondary Dressing: ComfortFoam Border, 4x4 in (silicone border) 2 x Per Week/15 Days Discharge Instructions: Apply over primary dressing as directed. WOUND #4: - Foot Wound Laterality: Dorsal, Left Cleanser: Wound Cleanser 2 x Per Week/15 Days Discharge Instructions: Cleanse the wound with wound cleanser prior to applying a clean dressing using gauze sponges, not tissue or cotton balls. Cleanser: Soap and Water 2 x Per Week/15 Days Discharge Instructions: May shower and wash wound with dial antibacterial soap and water prior to dressing change. Prim Dressing: KerraCel Ag Gelling Fiber Dressing, 4x5 in (silver alginate) 2 x Per Week/15 Days ary Discharge Instructions: Apply silver alginate to wound bed as instructed Secondary Dressing: ABD Pad, 5x9 2 x Per Week/15 Days Discharge Instructions: Apply over primary dressing as directed. Com pression Wrap: ThreePress (3 layer compression wrap) 2 x Per Week/15 Days Discharge Instructions: Apply LIGHTY three layer compression as directed. WOUND #6: - Lower Leg Wound Laterality: Left,  Posterior Cleanser: Wound Cleanser 2 x Per Week/15 Days Discharge Instructions: Cleanse the wound with wound cleanser prior to applying a clean dressing using gauze sponges, not tissue or cotton balls. Cleanser: Soap and Water 2 x Per Week/15 Days Discharge Instructions: May shower and wash wound with dial antibacterial soap and water prior to dressing change. Prim Dressing: KerraCel Ag Gelling Fiber Dressing, 4x5 in (silver alginate) 2 x Per Week/15 Days ary Discharge Instructions: Apply silver alginate to wound bed as instructed Secondary Dressing: ABD Pad, 5x9 2 x Per Week/15 Days Discharge Instructions: Apply over primary dressing as directed. Com pression Wrap: ThreePress (3 layer compression wrap) 2 x Per Week/15 Days Discharge Instructions: Apply LIGHTY three layer compression as directed. WOUND #7: - Lower Leg Wound Laterality: Left, Anterior, Proximal Cleanser: Wound Cleanser 2 x Per Week/15 Days Discharge Instructions: Cleanse the wound with wound cleanser prior to applying a clean dressing using gauze sponges, not tissue or cotton balls. Cleanser: Soap and Water 2 x Per Week/15 Days Discharge Instructions: May shower and wash wound with dial antibacterial soap and water prior to dressing change. Prim Dressing: KerraCel Ag Gelling Fiber Dressing, 4x5 in (silver alginate) 2 x Per Week/15 Days ary Discharge Instructions: Apply silver alginate to wound bed as instructed Secondary Dressing: ABD Pad, 5x9 2 x Per Week/15 Days Discharge Instructions: Apply over primary dressing as directed. Com pression Wrap: ThreePress (3 layer compression wrap) 2 x Per Week/15 Days Discharge Instructions: Apply LIGHTY three layer compression as directed. WOUND #8: - Upper Leg Wound Laterality: Left, Posterior Cleanser: Wound Cleanser 2 x Per Week/15 Days Discharge Instructions: Cleanse the wound with wound cleanser prior to applying a clean dressing using gauze sponges, not tissue or  cotton  balls. Cleanser: Soap and Water 2 x Per Week/15 Days Discharge Instructions: May shower and wash wound with dial antibacterial soap and water prior to dressing change. Prim Dressing: KerraCel Ag Gelling Fiber Dressing, 4x5 in (silver alginate) 2 x Per Week/15 Days ary Discharge Instructions: Apply silver alginate to wound bed as instructed Secondary Dressing: ComfortFoam Border, 4x4 in (silicone border) 2 x Per Week/15 Days Discharge Instructions: Apply over primary dressing as directed. WOUND #9: - Upper Leg Wound Laterality: Left, Posterior, Proximal Cleanser: Wound Cleanser 2 x Per Week/15 Days Discharge Instructions: Cleanse the wound with wound cleanser prior to applying a clean dressing using gauze sponges, not tissue or cotton balls. Cleanser: Soap and Water 2 x Per Week/15 Days Discharge Instructions: May shower and wash wound with dial antibacterial soap and water prior to dressing change. Prim Dressing: KerraCel Ag Gelling Fiber Dressing, 4x5 in (silver alginate) 2 x Per Week/15 Days ary Discharge Instructions: Apply silver alginate to wound bed as instructed Secondary Dressing: ComfortFoam Border, 4x4 in (silicone border) 2 x Per Week/15 Days Discharge Instructions: Apply over primary dressing as directed. 1. The patient tolerated the 3 layer compression 2. 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 With regards to the latter I would like to reattempt his ABIs next week its likely will have to refer him back to vein and vascular with orwithout repeat noninvasive studies 4. Still using silver alginate on all wound areas. 5. In passing he had me look at some areas on his scrotum he had superficial wound areas but no real rash the wounds themselves did not look ominous this does not look like anything specific or anything that needs biopsy or culture. I asked him to use zinc oxide/Desitin with an ABD. Make sure that he is not traumatizing these areas with his  fingernails Electronic Signature(s) Signed: 06/19/2020 5:59:48 PM By: Linton Ham MD Entered By: Linton Ham on 06/19/2020 17:56:41 -------------------------------------------------------------------------------- SuperBill Details Patient Name: Date of Service: [name], [name] 06/19/2020 Medical Record Number: [id number] Patient Account Number: [id number] Date of Birth/Sex: Treating RN: Jul 24, 1940 (80 y.o. 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 Modifier: Quantity: 1 Physician Procedures Electronic Signature(s) Signed: 06/19/2020 5:59:48 PM By: Linton Ham MD Previous Signature: 06/19/2020 5:58:27 PM Version By: [name] RN Entered By: Linton Ham on 06/19/2020 17:57:04

## 2020-06-20 ENCOUNTER — Telehealth: Payer: Self-pay

## 2020-06-20 NOTE — Telephone Encounter (Signed)
Spoke with the patient and he stated that he received a motorized wheelchair recently but was unable to use it do the space in his mobile home. He sent the wheelchair back, he would like to have a scooter to get around his house. His confined to one room. He mentioned that he spoke with [place] in [facility] and they told him that he just needed a letter from our office stating why he needs a scooter. Please advise

## 2020-06-21 IMAGING — CT CT CERVICAL SPINE W/O CM
2 series · 10 of 14 positions shown, 12 images · non-contrast
Comparison: MRI cervical spine 01/10/2018

CLINICAL DATA: Cervical spinal stenosis.  Headache and neck pain

EXAM:
CT CERVICAL SPINE WITHOUT CONTRAST
TECHNIQUE: Multidetector CT imaging of the cervical spine was performed without
intravenous contrast. Multiplanar CT image reconstructions were also
generated.

[Series 3: cspine soft · axial · 0.38mm/px · z∈[-230,-120]mm · 4 of 93 slices shown]
[im 19/93  soft-tissue]
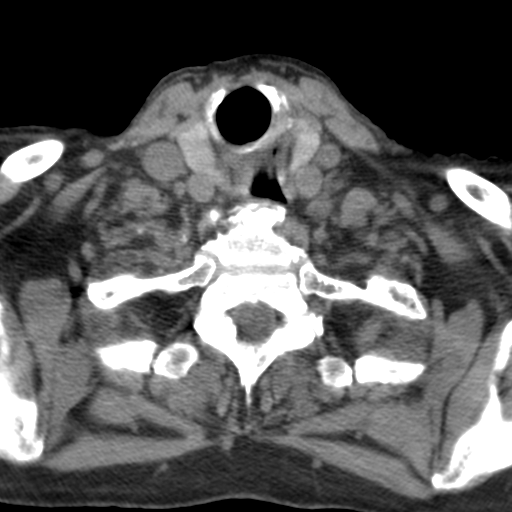
[im 37/93  soft-tissue]
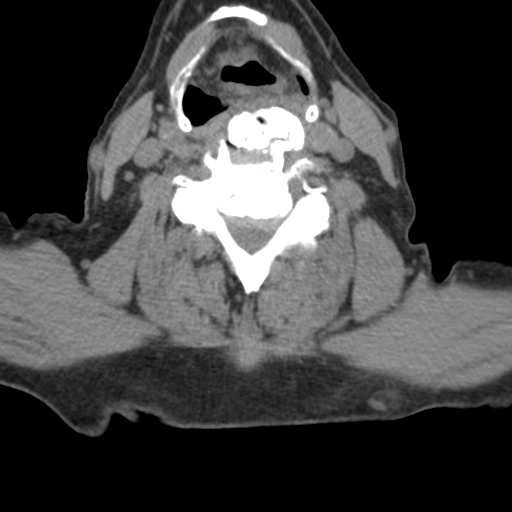
[im 56/93  soft-tissue]
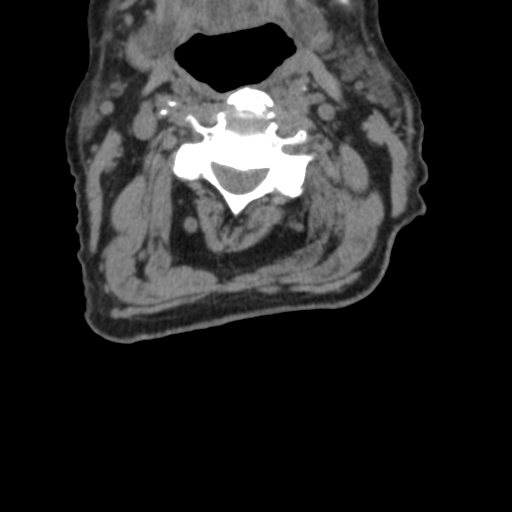
[im 74/93  soft-tissue]
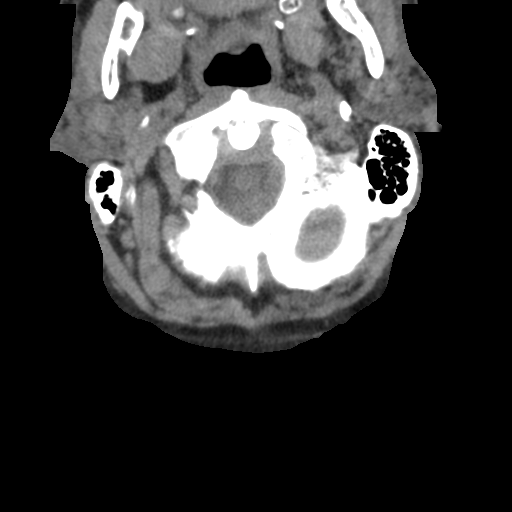

[Series 9: angled axial · axial · 0.29mm/px · z∈[-291,-133]mm · 6 of 125 slices shown, 8 images]
[im 18/125  soft-tissue]
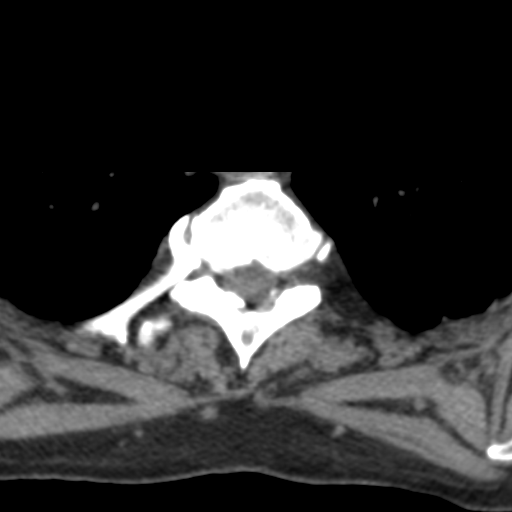
[im 18/125  bone]
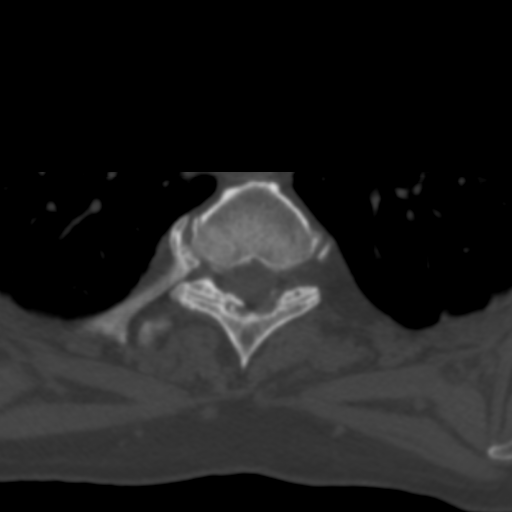
[im 36/125  bone]
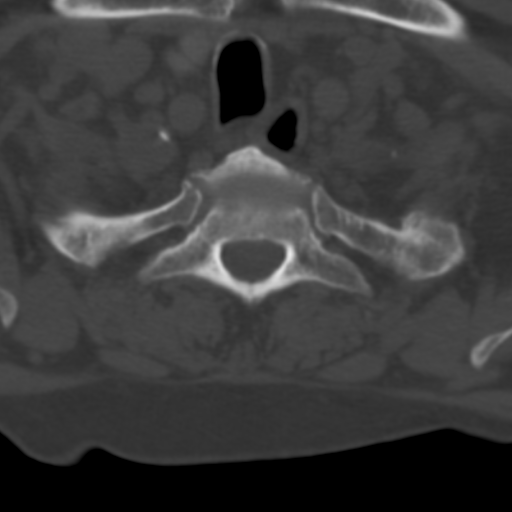
[im 54/125  bone]
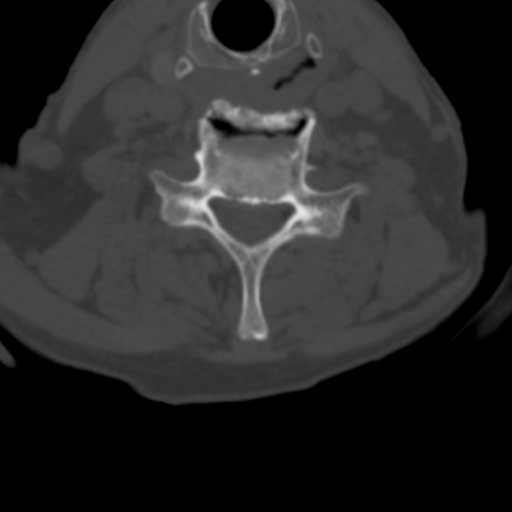
[im 71/125  bone]
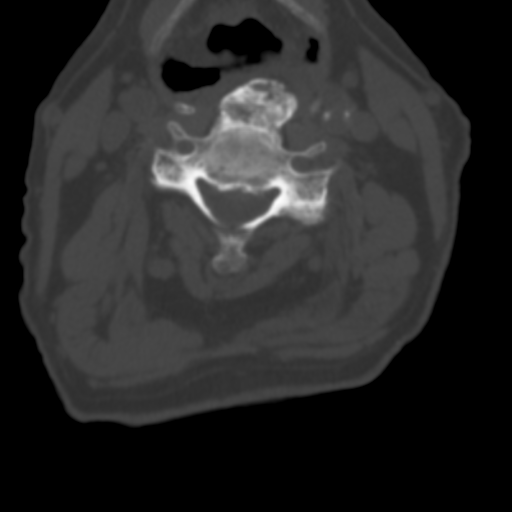
[im 89/125  soft-tissue]
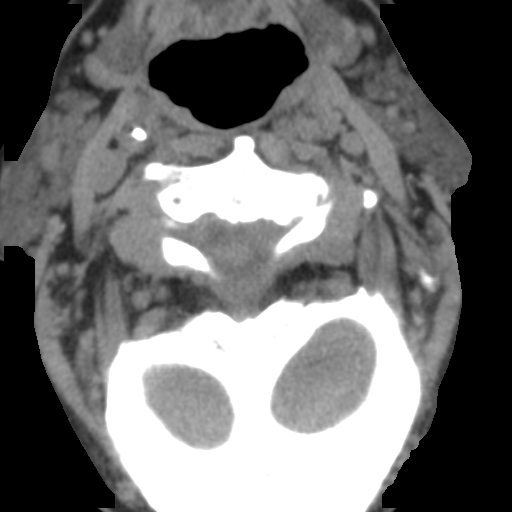
[im 89/125  bone]
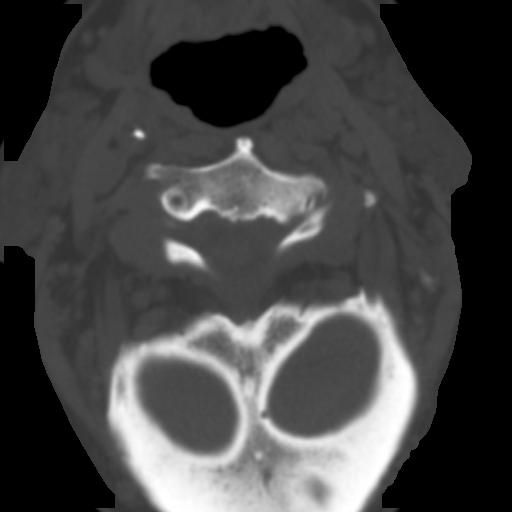
[im 107/125  bone]
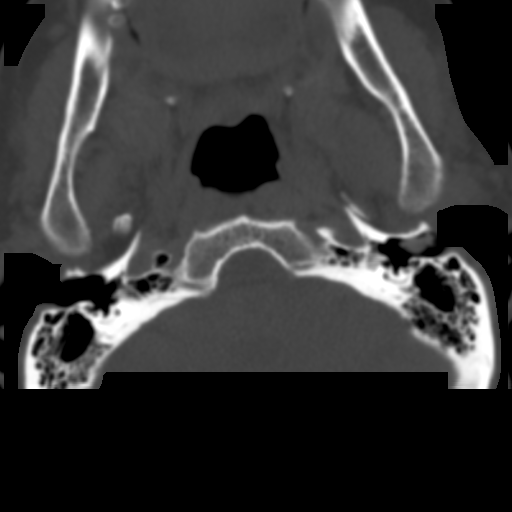

[10 of 14 positions shown; findings below may reference images not displayed]

FINDINGS: Alignment: Normal

Skull base and vertebrae: Negative for fracture or mass. Bony
erosions in the dens and in the left C2-3 facet. Question rheumatoid
arthritis

Soft tissues and spinal canal: Negative for soft tissue mass or
edema.

Disc levels: Large anterior osteophytes throughout the cervical
spine, most prominent C2 through C5. These are displacing the
pharynx anteriorly.

Congenital stenosis of the cervical canal

C1-2: Prominent pannus formation posterior to the dens. Erosive
changes in the dens. Degenerative change in the C1-2 joint laterally
bilaterally. Mild spinal stenosis.

C2-3: Disc degeneration and spondylosis. Central disc protrusion.
Left-sided facet arthropathy with joint space destruction and
subchondral cyst. Mild spinal stenosis and mild foraminal stenosis
bilaterally

C3-4: Disc degeneration with central disc protrusion and diffuse
uncinate spurring. Left-sided facet hypertrophy. Moderate foraminal
stenosis bilaterally. Moderate spinal stenosis.

C4-5: Disc degeneration and spondylosis. Diffuse uncinate spurring.
Severe right foraminal encroachment and mild left foraminal
encroachment due to spurring. Mild spinal stenosis.

C5-6: Disc degeneration and spondylosis. Diffuse uncinate spurring.
Severe foraminal encroachment bilaterally with moderate spinal
stenosis

C6-7: Disc degeneration and spondylosis with diffuse uncinate
spurring. Moderate left foraminal encroachment and mild right
foraminal encroachment. Mild spinal stenosis

C7-T1: Mild disc and mild facet degeneration without significant
stenosis.

Upper chest: Mild apical emphysema without acute abnormality.

Other: None
IMPRESSION: Advanced multilevel degenerative change throughout the cervical
spine. Multilevel spinal and foraminal stenosis due to spurring as
described above.

C1-2 arthropathy. Erosive changes in the dens raising the
possibility of rheumatoid arthritis or CPPD.

## 2020-06-21 NOTE — Telephone Encounter (Signed)
Letter done on mychart. Have printed and signed on your desk in case he needs a copy faxed to [facility]. If modifications are needed to his letter let me know.

## 2020-06-22 ENCOUNTER — Encounter: Payer: Self-pay | Admitting: Internal Medicine

## 2020-06-22 ENCOUNTER — Encounter (HOSPITAL_BASED_OUTPATIENT_CLINIC_OR_DEPARTMENT_OTHER): Payer: Medicare Other | Admitting: Physician Assistant

## 2020-06-22 ENCOUNTER — Other Ambulatory Visit: Payer: Self-pay

## 2020-06-22 DIAGNOSIS — I509 Heart failure, unspecified: Secondary | ICD-10-CM | POA: Diagnosis not present

## 2020-06-22 DIAGNOSIS — I87333 Chronic venous hypertension (idiopathic) with ulcer and inflammation of bilateral lower extremity: Secondary | ICD-10-CM | POA: Diagnosis not present

## 2020-06-22 DIAGNOSIS — L97528 Non-pressure chronic ulcer of other part of left foot with other specified severity: Secondary | ICD-10-CM | POA: Diagnosis not present

## 2020-06-22 DIAGNOSIS — I110 Hypertensive heart disease with heart failure: Secondary | ICD-10-CM | POA: Diagnosis not present

## 2020-06-22 DIAGNOSIS — L97818 Non-pressure chronic ulcer of other part of right lower leg with other specified severity: Secondary | ICD-10-CM | POA: Diagnosis not present

## 2020-06-22 DIAGNOSIS — L97228 Non-pressure chronic ulcer of left calf with other specified severity: Secondary | ICD-10-CM | POA: Diagnosis not present

## 2020-06-22 DIAGNOSIS — I890 Lymphedema, not elsewhere classified: Secondary | ICD-10-CM | POA: Diagnosis not present

## 2020-06-22 NOTE — Telephone Encounter (Signed)
Letter has been faxed to [facility] with confirmation fax has been received. Patient has been notified via [employer].

## 2020-06-25 DIAGNOSIS — Z79899 Other long term (current) drug therapy: Secondary | ICD-10-CM | POA: Diagnosis not present

## 2020-06-25 DIAGNOSIS — L0390 Cellulitis, unspecified: Secondary | ICD-10-CM | POA: Diagnosis not present

## 2020-06-25 DIAGNOSIS — G894 Chronic pain syndrome: Secondary | ICD-10-CM | POA: Diagnosis not present

## 2020-06-25 DIAGNOSIS — Z96642 Presence of left artificial hip joint: Secondary | ICD-10-CM | POA: Diagnosis not present

## 2020-06-25 DIAGNOSIS — M5416 Radiculopathy, lumbar region: Secondary | ICD-10-CM | POA: Diagnosis not present

## 2020-06-25 DIAGNOSIS — E559 Vitamin D deficiency, unspecified: Secondary | ICD-10-CM | POA: Diagnosis not present

## 2020-06-25 NOTE — Progress Notes (Signed)
Mcdowell Mcdowell TAUL ([id number]) Visit Report for 06/22/2020 Arrival Information Details Patient Name: Date of Service: Mcdowell Mcdowell 06/22/2020 10:45 A M Medical Record Number: [id number] Patient Account Number: [id number] Date of Birth/Sex: Treating RN: Apr 28, 1940 (80 y.o. Mcdowell Mcdowell, [name] Primary Care Briony Parveen: [name] Other Clinician: Referring Zeniya Lapidus: Treating [name]/Extender: Dreama Saa in Treatment: 1 Visit Information History Since Last Visit Added or deleted any medications: No Patient Arrived: Wheel Chair Any new allergies or adverse reactions: No Arrival Time: 11:11 Had a fall or experienced change in No Accompanied By: swlf activities of daily living that may affect Transfer Assistance: None risk of falls: Patient Identification Verified: Yes Signs or symptoms of abuse/neglect since last visito No Secondary Verification Process Completed: Yes Hospitalized since last visit: No Patient Requires Transmission-Based Precautions: No Implantable device outside of the clinic excluding No Patient Has Alerts: No cellular tissue based products placed in the center since last visit: Has Dressing in Place as Prescribed: Yes Has Compression in Place as Prescribed: Yes Pain Present Now: Yes Electronic Signature(s) Signed: 06/25/2020 5:34:21 PM By: [name] RN Entered By: [name] on 06/22/2020 11:11:31 -------------------------------------------------------------------------------- Compression Therapy Details Patient Name: Date of Service: [name], [name] 06/22/2020 10:45 A M Medical Record Number: [id number] Patient Account Number: [id number] Date of Birth/Sex: Treating RN: 14-Jul-1940 (80 y.o. [name] Primary Care Labella Zahradnik: [name] Other Clinician: Referring Raseel Jans: Treating Kelle Ruppert/Extender: Dreama Saa in Treatment: 1 Compression Therapy  Performed for Wound Assessment: Wound #1 Right,Medial Malleolus Performed By: Clinician [name], RN Compression Type: Three Layer Electronic Signature(s) Signed: 06/25/2020 5:34:21 PM By: [name] RN Entered By: [name] on 06/22/2020 11:44:32 -------------------------------------------------------------------------------- Compression Therapy Details Patient Name: Date of Service: [name], [name] 06/22/2020 10:45 A M Medical Record Number: [id number] Patient Account Number: [id number] Date of Birth/Sex: Treating RN: 09/30/1940 (80 y.o. [name] Primary Care Gayl Ivanoff: [name] Other Clinician: Referring Laylani Pudwill: Treating Tihanna Goodson/Extender: Dreama Saa in Treatment: 1 Compression Therapy Performed for Wound Assessment: Wound #2 Right,Posterior Lower Leg Performed By: Clinician [name], RN Compression Type: Three Layer Electronic Signature(s) Signed: 06/25/2020 5:34:21 PM By: [name] RN Entered By: [name] on 06/22/2020 11:44:32 -------------------------------------------------------------------------------- Compression Therapy Details Patient Name: Date of Service: [name], [name] 06/22/2020 10:45 A M Medical Record Number: [id number] Patient Account Number: [id number] Date of Birth/Sex: Treating RN: Dec 19, 1940 (80 y.o. [name] Primary Care Jazariah Teall: [name] Other Clinician: Referring Janaisha Tolsma: Treating [name]/Extender: Dreama Saa in Treatment: 1 Compression Therapy Performed for Wound Assessment: Wound #4 Left,Dorsal Foot Performed By: Clinician [name], RN Compression Type: Three Layer Electronic Signature(s) Signed: 06/25/2020 5:34:21 PM By: [name] RN Entered By: [name] on 06/22/2020  11:44:32 -------------------------------------------------------------------------------- Compression Therapy Details Patient Name: Date of Service: [name], [name] 06/22/2020 10:45 A M Medical Record Number: [id number] Patient Account Number: [id number] Date of Birth/Sex: Treating RN: 1940-06-03 (80 y.o. [name] Primary Care Brenner Visconti: [name] Other Clinician: Referring Annye Forrey: Treating [name]/Extender: Dreama Saa in Treatment: 1 Compression Therapy Performed for Wound Assessment: Wound #6 Left,Posterior Lower Leg Performed By: Clinician [name], RN Compression Type: Three Layer Electronic Signature(s) Signed: 06/25/2020 5:34:21 PM By: [name] RN Entered By: [name] on 06/22/2020 11:44:32 -------------------------------------------------------------------------------- Compression Therapy Details Patient Name: Date of Service: [name], [name] 06/22/2020 10:45 A M Medical Record  Number: [id number] Patient Account Number: [id number] Date of Birth/Sex: Treating RN: Oct 01, 1940 (80 y.o. [name] Primary Care Mase Dhondt: Other Clinician: [name] Referring Kofi Murrell: Treating [name]/Extender: Dreama Saa in Treatment: 1 Compression Therapy Performed for Wound Assessment: Wound #7 Left,Proximal,Anterior Lower Leg Performed By: Clinician [name], RN Compression Type: Three Layer Electronic Signature(s) Signed: 06/25/2020 5:34:21 PM By: [name] RN Entered By: [name] on 06/22/2020 11:44:32 -------------------------------------------------------------------------------- Compression Therapy Details Patient Name: Date of Service: [name], [name] 06/22/2020 10:45 A M Medical Record Number: [id number] Patient Account Number: [id number] Date of Birth/Sex: Treating RN: [birth year] (80 y.o. [name] Primary Care Simcha Speir: [name] Other Clinician: Referring Mertha Clyatt: Treating [name]/Extender: Dreama Saa in Treatment: 1 Compression Therapy Performed for Wound Assessment: Wound #8 Left,Posterior Upper Leg Performed By: Clinician [name], RN Compression Type: Three Layer Electronic Signature(s) Signed: 06/25/2020 5:34:21 PM By: [name] RN Entered By: [name] on 06/22/2020 11:44:32 -------------------------------------------------------------------------------- Encounter Discharge Information Details Patient Name: Date of Service: [name], Lamy 06/22/2020 10:45 A M Medical Record Number: [id number] Patient Account Number: [id number] Date of Birth/Sex: Treating RN: 11-Jul-1940 (80 y.o. Mcdowell Mcdowell, [name] Primary Care Nemiah Kissner: [name] Other Clinician: Referring Damaris Abeln: Treating Alistar Mcenery/Extender: Dreama Saa in Treatment: 1 Encounter Discharge Information Items Discharge Condition: Stable Ambulatory Status: Wheelchair Discharge Destination: Home Transportation: Private Auto Accompanied By: self Schedule Follow-up Appointment: Yes Clinical Summary of Care: Patient Declined Electronic Signature(s) Signed: 06/25/2020 5:34:21 PM By: [name] RN Entered By: [name] on 06/22/2020 11:46:12 -------------------------------------------------------------------------------- Pain Assessment Details Patient Name: Date of Service: [name], [name] 06/22/2020 10:45 A M Medical Record Number: [id number] Patient Account Number: [id number] Date of Birth/Sex: Treating RN: Dec 23, 1940 (80 y.o. Mcdowell Mcdowell, [name] Primary Care Aviannah Castoro: [name] Other Clinician: Referring Allisson Schindel: Treating Atiana Levier/Extender: Dreama Saa in Treatment: 1 Active Problems Location of Pain Severity and Description of  Pain Patient Has Paino Yes Site Locations Pain Location: Pain in Ulcers With Dressing Change: Yes Duration of the Pain. Constant / Intermittento Intermittent Rate the pain. Current Pain Level: 9 Worst Pain Level: 10 Least Pain Level: 0 Tolerable Pain Level: 9 Character of Pain Describe the Pain: Aching Pain Management and Medication Current Pain Management: Medication: Yes Cold Application: No Rest: Yes Massage: No Activity: No T.E.N.S.: No Heat Application: No Leg drop or elevation: No Is the Current Pain Management Adequate: Adequate How does your wound impact your activities of daily livingo Sleep: No Bathing: No Appetite: No Relationship With Others: No Bladder Continence: No Emotions: No Bowel Continence: No Work: No Toileting: No Drive: No Dressing: No Hobbies: No Electronic Signature(s) Signed: 06/25/2020 5:34:21 PM By: [name] RN Entered By: [name] on 06/22/2020 11:14:16 -------------------------------------------------------------------------------- Patient/Caregiver Education Details Patient Name: Date of Service: [name], [name] 3/4/2022andnbsp10:45 A M Medical Record Number: [id number] Patient Account Number: [id number] Date of Birth/Gender: Treating RN: [birth year] (80 y.o. [name] Primary Care Physician: [name] Other Clinician: Referring Physician: Treating Physician/Extender: Dreama Saa in Treatment: 1 Education Assessment Education Provided To: Patient Education Topics Provided Basic Hygiene: Methods: Explain/Verbal Responses: Reinforcements needed Electronic Signature(s) Signed: 06/25/2020 5:34:21 PM By: [name] RN Entered By: [name] on 06/22/2020 11:46:01 -------------------------------------------------------------------------------- Wound Assessment Details Patient Name: Date of Service: [name], [name] 06/22/2020 10:45 A  M Medical Record Number: [id number] Patient Account Number: [id number] Date of  Birth/Sex: Treating RN: 07-31-1940 (80 y.o. Mcdowell Mcdowell, [name] Primary Care Lateka Rady: [name] Other Clinician: Referring Larry Knipp: Treating Ambyr Qadri/Extender: Dreama Saa in Treatment: 1 Wound Status Wound Number: 1 Primary Etiology: Venous Leg Ulcer Wound Location: Right, Medial Malleolus Secondary Etiology: Lymphedema Wounding Event: Gradually Appeared Wound Status: Open Date Acquired: 11/20/2019 Weeks Of Treatment: 1 Clustered Wound: No Wound Measurements Length: (cm) 3.7 Width: (cm) 3.2 Depth: (cm) 0.1 Area: (cm) 9.299 Volume: (cm) 0.93 % Reduction in Area: 22.1% % Reduction in Volume: 22.1% Wound Description Classification: Full Thickness Without Exposed Support Structur es Treatment Notes Wound #1 (Malleolus) Wound Laterality: Right, Medial Cleanser Wound Cleanser Discharge Instruction: Cleanse the wound with wound cleanser prior to applying a clean dressing using gauze sponges, not tissue or cotton balls. Soap and Water Discharge Instruction: May shower and wash wound with dial antibacterial soap and water prior to dressing change. Peri-Wound Care Topical Primary Dressing KerraCel Ag Gelling Fiber Dressing, 4x5 in (silver alginate) Discharge Instruction: Apply silver alginate to wound bed as instructed Secondary Dressing ABD Pad, 5x9 Discharge Instruction: Apply over primary dressing as directed. Secured With Compression Wrap ThreePress (3 layer compression wrap) Discharge Instruction: Apply LIGHTY three layer compression as directed. Compression Stockings Add-Ons Electronic Signature(s) Signed: 06/25/2020 5:34:21 PM By: [name] RN Entered By: [name] on 06/22/2020 11:43:26 -------------------------------------------------------------------------------- Wound Assessment Details Patient Name: Date of Service: GA  RNER, [name] 06/22/2020 10:45 A M Medical Record Number: [id number] Patient Account Number: [id number] Date of Birth/Sex: Treating RN: Aug 14, 1940 (81 y.o. Mcdowell Mcdowell, [name] Primary Care Paytan Recine: [name] Other Clinician: Referring Quamir Willemsen: Treating Roselyn Doby/Extender: Dreama Saa in Treatment: 1 Wound Status Wound Number: 2 Primary Etiology: Venous Leg Ulcer Wound Location: Right, Posterior Lower Leg Secondary Etiology: Lymphedema Wounding Event: Gradually Appeared Wound Status: Open Date Acquired: 11/20/2019 Weeks Of Treatment: 1 Clustered Wound: No Wound Measurements Length: (cm) 1.5 Width: (cm) 1.5 Depth: (cm) 0.1 Area: (cm) 1.767 Volume: (cm) 0.177 % Reduction in Area: 33.8% % Reduction in Volume: 33.7% Wound Description Classification: Full Thickness Without Exposed Support Structur es Treatment Notes Wound #2 (Lower Leg) Wound Laterality: Right, Posterior Cleanser Wound Cleanser Discharge Instruction: Cleanse the wound with wound cleanser prior to applying a clean dressing using gauze sponges, not tissue or cotton balls. Soap and Water Discharge Instruction: May shower and wash wound with dial antibacterial soap and water prior to dressing change. Peri-Wound Care Topical Primary Dressing KerraCel Ag Gelling Fiber Dressing, 4x5 in (silver alginate) Discharge Instruction: Apply silver alginate to wound bed as instructed Secondary Dressing ABD Pad, 5x9 Discharge Instruction: Apply over primary dressing as directed. Secured With Compression Wrap ThreePress (3 layer compression wrap) Discharge Instruction: Apply LIGHTY three layer compression as directed. Compression Stockings Add-Ons Electronic Signature(s) Signed: 06/25/2020 5:34:21 PM By: [name] RN Entered By: [name] on 06/22/2020 11:43:26 -------------------------------------------------------------------------------- Wound Assessment  Details Patient Name: Date of Service: [name], [name] 06/22/2020 10:45 A M Medical Record Number: [id number] Patient Account Number: [id number] Date of Birth/Sex: Treating RN: Nov 01, 1940 (80 y.o. Mcdowell Mcdowell, [name] Primary Care Draedyn Weidinger: [name] Other Clinician: Referring Joylyn Duggin: Treating Deneene Tarver/Extender: Dreama Saa in Treatment: 1 Wound Status Wound Number: 3 Primary Etiology: Abrasion Wound Location: Right Knee Wound Status: Open Wounding Event: Trauma Date Acquired: 06/04/2020 Weeks Of Treatment: 1 Clustered Wound: No Wound Measurements Length: (cm) 1.3 Width: (cm) 1.3 Depth: (cm) 0.1 Area: (cm) 1.327 Volume: (cm) 0.133 % Reduction in Area: 72.8% %  Reduction in Volume: 72.7% Wound Description Classification: Full Thickness Without Exposed Support Structur es Treatment Notes Wound #3 (Knee) Wound Laterality: Right Cleanser Wound Cleanser Discharge Instruction: Cleanse the wound with wound cleanser prior to applying a clean dressing using gauze sponges, not tissue or cotton balls. Soap and Water Discharge Instruction: May shower and wash wound with dial antibacterial soap and water prior to dressing change. Peri-Wound Care Topical Primary Dressing KerraCel Ag Gelling Fiber Dressing, 4x5 in (silver alginate) Discharge Instruction: Apply silver alginate to wound bed as instructed Secondary Dressing ComfortFoam Border, 4x4 in (silicone border) Discharge Instruction: Apply over primary dressing as directed. Secured With Compression Wrap Compression Stockings Environmental education officer) Signed: 06/25/2020 5:34:21 PM By: [name] RN Entered By: [name] on 06/22/2020 11:43:27 -------------------------------------------------------------------------------- Wound Assessment Details Patient Name: Date of Service: [name], [name] 06/22/2020 10:45 A M Medical Record Number:  [id number] Patient Account Number: [id number] Date of Birth/Sex: Treating RN: [birth year] (80 y.o. Mcdowell Mcdowell, [name] Primary Care Haelie Clapp: [name] Other Clinician: Referring Willett Lefeber: Treating Darlyn Repsher/Extender: Dreama Saa in Treatment: 1 Wound Status Wound Number: 4 Primary Etiology: Lymphedema Wound Location: Left, Dorsal Foot Wound Status: Open Wounding Event: Blister Date Acquired: 11/20/2019 Weeks Of Treatment: 1 Clustered Wound: No Wound Measurements Length: (cm) 1 Width: (cm) 2 Depth: (cm) 0.1 Area: (cm) 1.571 Volume: (cm) 0.157 % Reduction in Area: -9.9% % Reduction in Volume: -9.8% Wound Description Classification: Full Thickness Without Exposed Support Structur es Treatment Notes Wound #4 (Foot) Wound Laterality: Dorsal, Left Cleanser Wound Cleanser Discharge Instruction: Cleanse the wound with wound cleanser prior to applying a clean dressing using gauze sponges, not tissue or cotton balls. Soap and Water Discharge Instruction: May shower and wash wound with dial antibacterial soap and water prior to dressing change. Peri-Wound Care Topical Primary Dressing KerraCel Ag Gelling Fiber Dressing, 4x5 in (silver alginate) Discharge Instruction: Apply silver alginate to wound bed as instructed Secondary Dressing ABD Pad, 5x9 Discharge Instruction: Apply over primary dressing as directed. Secured With Compression Wrap ThreePress (3 layer compression wrap) Discharge Instruction: Apply LIGHTY three layer compression as directed. Compression Stockings Add-Ons Electronic Signature(s) Signed: 06/25/2020 5:34:21 PM By: [name] RN Entered By: [name] on 06/22/2020 11:43:27 -------------------------------------------------------------------------------- Wound Assessment Details Patient Name: Date of Service: [name], [name] 06/22/2020 10:45 A M Medical Record Number: [id number] Patient Account  Number: [id number] Date of Birth/Sex: Treating RN: 02/24/1941 (80 y.o. Mcdowell Mcdowell, [name] Primary Care Teola Felipe: [name] Other Clinician: Referring Markesha Hannig: Treating Teyona Nichelson/Extender: Dreama Saa in Treatment: 1 Wound Status Wound Number: 6 Primary Etiology: Lymphedema Wound Location: Left, Posterior Lower Leg Secondary Etiology: Venous Leg Ulcer Wounding Event: Gradually Appeared Wound Status: Open Date Acquired: 11/20/2019 Weeks Of Treatment: 1 Clustered Wound: No Wound Measurements Length: (cm) 3.2 Width: (cm) 3.3 Depth: (cm) 0.1 Area: (cm) 8.294 Volume: (cm) 0.829 % Reduction in Area: 17.9% % Reduction in Volume: 18% Wound Description Classification: Full Thickness Without Exposed Support Structur es Treatment Notes Wound #6 (Lower Leg) Wound Laterality: Left, Posterior Cleanser Wound Cleanser Discharge Instruction: Cleanse the wound with wound cleanser prior to applying a clean dressing using gauze sponges, not tissue or cotton balls. Soap and Water Discharge Instruction: May shower and wash wound with dial antibacterial soap and water prior to dressing change. Peri-Wound Care Topical Primary Dressing KerraCel Ag Gelling Fiber Dressing, 4x5 in (silver alginate) Discharge Instruction: Apply silver alginate to wound bed as instructed Secondary Dressing ABD Pad, 5x9 Discharge  Instruction: Apply over primary dressing as directed. Secured With Compression Wrap ThreePress (3 layer compression wrap) Discharge Instruction: Apply LIGHTY three layer compression as directed. Compression Stockings Add-Ons Electronic Signature(s) Signed: 06/25/2020 5:34:21 PM By: [name] RN Entered By: [name] on 06/22/2020 11:43:27 -------------------------------------------------------------------------------- Wound Assessment Details Patient Name: Date of Service: [name], [name] 06/22/2020 10:45 A M Medical  Record Number: [id number] Patient Account Number: [id number] Date of Birth/Sex: Treating RN: 1940/10/28 (80 y.o. Mcdowell Mcdowell, [name] Primary Care Myeisha Kruser: [name] Other Clinician: Referring Norwood Quezada: Treating Richy Spradley/Extender: Dreama Saa in Treatment: 1 Wound Status Wound Number: 7 Primary Etiology: Lymphedema Wound Location: Left, Proximal, Anterior Lower Leg Wound Status: Open Wounding Event: Trauma Date Acquired: 06/04/2020 Weeks Of Treatment: 1 Clustered Wound: No Wound Measurements Length: (cm) 1 Width: (cm) 1.1 Depth: (cm) 0.1 Area: (cm) 0.864 Volume: (cm) 0.086 % Reduction in Area: 47.1% % Reduction in Volume: 47.2% Wound Description Classification: Full Thickness Without Exposed Support Structur es Treatment Notes Wound #7 (Lower Leg) Wound Laterality: Left, Anterior, Proximal Cleanser Wound Cleanser Discharge Instruction: Cleanse the wound with wound cleanser prior to applying a clean dressing using gauze sponges, not tissue or cotton balls. Soap and Water Discharge Instruction: May shower and wash wound with dial antibacterial soap and water prior to dressing change. Peri-Wound Care Topical Primary Dressing KerraCel Ag Gelling Fiber Dressing, 4x5 in (silver alginate) Discharge Instruction: Apply silver alginate to wound bed as instructed Secondary Dressing ABD Pad, 5x9 Discharge Instruction: Apply over primary dressing as directed. Secured With Compression Wrap ThreePress (3 layer compression wrap) Discharge Instruction: Apply LIGHTY three layer compression as directed. Compression Stockings Add-Ons Electronic Signature(s) Signed: 06/25/2020 5:34:21 PM By: [name] RN Signed: 06/25/2020 5:34:21 PM By: [name] RN Entered By: [name] on 06/22/2020 11:43:27 -------------------------------------------------------------------------------- Wound Assessment Details Patient Name: Date of  Service: [name], [name] 06/22/2020 10:45 A M Medical Record Number: [id number] Patient Account Number: [id number] Date of Birth/Sex: Treating RN: 1940/04/22 (80 y.o. Mcdowell Mcdowell, [name] Primary Care Yacoub Diltz: [name] Other Clinician: Referring Kenzie Thoreson: Treating Keeley Sussman/Extender: Dreama Saa in Treatment: 1 Wound Status Wound Number: 8 Primary Etiology: Pressure Ulcer Wound Location: Left, Posterior Upper Leg Wound Status: Open Wounding Event: Pressure Injury Date Acquired: 05/23/2019 Weeks Of Treatment: 1 Clustered Wound: No Wound Measurements Length: (cm) 0.5 Width: (cm) 0.5 Depth: (cm) 0.1 Area: (cm) 0.196 Volume: (cm) 0.02 % Reduction in Area: 94.2% % Reduction in Volume: 94.1% Wound Description Classification: Category/Stage II Treatment Notes Wound #8 (Upper Leg) Wound Laterality: Left, Posterior Cleanser Wound Cleanser Discharge Instruction: Cleanse the wound with wound cleanser prior to applying a clean dressing using gauze sponges, not tissue or cotton balls. Soap and Water Discharge Instruction: May shower and wash wound with dial antibacterial soap and water prior to dressing change. Peri-Wound Care Topical Primary Dressing KerraCel Ag Gelling Fiber Dressing, 4x5 in (silver alginate) Discharge Instruction: Apply silver alginate to wound bed as instructed Secondary Dressing ComfortFoam Border, 4x4 in (silicone border) Discharge Instruction: Apply over primary dressing as directed. Secured With Compression Wrap Compression Stockings Environmental education officer) Signed: 06/25/2020 5:34:21 PM By: [name] RN Entered By: [name] on 06/22/2020 11:43:27 -------------------------------------------------------------------------------- Wound Assessment Details Patient Name: Date of Service: [name], [name] 06/22/2020 10:45 A M Medical Record Number: [id number] Patient Account Number:  [id number] Date of Birth/Sex: Treating RN: Mar 06, 1941 (80 y.o. [name] Primary Care Rubina Basinski: [name] Other Clinician: Referring Ikhlas Albo: Treating Trelon Plush/Extender:  Stone III, Alla German, Elizabeth Weeks in Treatment: 1 Wound Status Wound Number: 9 Primary Etiology: Pressure Ulcer Wound Location: Left, Proximal, Posterior Upper Leg Wound Status: Open Wounding Event: Pressure Injury Date Acquired: 05/23/2019 Weeks Of Treatment: 1 Clustered Wound: No Wound Measurements Length: (cm) 2 Width: (cm) 2 Depth: (cm) 0.1 Area: (cm) 3.142 Volume: (cm) 0.314 % Reduction in Area: -900.6% % Reduction in Volume: -912.9% Wound Description Classification: Category/Stage II Treatment Notes Wound #9 (Upper Leg) Wound Laterality: Left, Posterior, Proximal Cleanser Wound Cleanser Discharge Instruction: Cleanse the wound with wound cleanser prior to applying a clean dressing using gauze sponges, not tissue or cotton balls. Soap and Water Discharge Instruction: May shower and wash wound with dial antibacterial soap and water prior to dressing change. Peri-Wound Care Topical Primary Dressing KerraCel Ag Gelling Fiber Dressing, 4x5 in (silver alginate) Discharge Instruction: Apply silver alginate to wound bed as instructed Secondary Dressing ComfortFoam Border, 4x4 in (silicone border) Discharge Instruction: Apply over primary dressing as directed. Secured With Compression Wrap Compression Stockings Environmental education officer) Signed: 06/25/2020 5:34:21 PM By: [name] RN Entered By: [name] on 06/22/2020 11:43:27 -------------------------------------------------------------------------------- Vitals Details Patient Name: Date of Service: [name], [name] 06/22/2020 10:45 A M Medical Record Number: [id number] Patient Account Number: [id number] Date of Birth/Sex: Treating RN: [birth year] (80 y.o. Mcdowell Mcdowell, [name]  Sahasra Belue: [name] Other Clinician: Referring Lunell Robart: Treating Lillian Tigges/Extender: Dreama Saa in Treatment: 1 Vital Signs Time Taken: 11:14 Temperature (F): 98.7 Height (in): 71 Pulse (bpm): 78 Weight (lbs): 178 Respiratory Rate (breaths/min): 17 Body Mass Index (BMI): 24.8 Blood Pressure (mmHg): 130/74 Reference Range: 80 - 120 mg / dl Electronic Signature(s) Signed: 06/25/2020 5:34:21 PM By: [name] RN Entered By: [name] on 06/22/2020 11:14:47

## 2020-06-25 NOTE — Progress Notes (Signed)
Captain, [name] ([id number]) Visit Report for 06/22/2020 SuperBill Details Patient Name: Date of Service: [name], [name] 06/22/2020 Medical Record Number: [id number] Patient Account Number: [id number] Date of Birth/Sex: Treating RN: [birth year] (80 y.o. 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 Electronic Signature(s) Signed: 06/22/2020 4:38:50 PM By: Worthy Keeler PA-C Signed: 06/25/2020 5:34:21 PM By: [name] RN Entered By: [name] on 06/22/2020 11:46:22

## 2020-06-26 ENCOUNTER — Other Ambulatory Visit: Payer: Self-pay

## 2020-06-26 ENCOUNTER — Encounter (HOSPITAL_BASED_OUTPATIENT_CLINIC_OR_DEPARTMENT_OTHER): Payer: Medicare Other | Admitting: Physician Assistant

## 2020-06-26 DIAGNOSIS — L97818 Non-pressure chronic ulcer of other part of right lower leg with other specified severity: Secondary | ICD-10-CM | POA: Diagnosis not present

## 2020-06-26 DIAGNOSIS — I509 Heart failure, unspecified: Secondary | ICD-10-CM | POA: Diagnosis not present

## 2020-06-26 DIAGNOSIS — L97522 Non-pressure chronic ulcer of other part of left foot with fat layer exposed: Secondary | ICD-10-CM | POA: Diagnosis not present

## 2020-06-26 DIAGNOSIS — I87333 Chronic venous hypertension (idiopathic) with ulcer and inflammation of bilateral lower extremity: Secondary | ICD-10-CM | POA: Diagnosis not present

## 2020-06-26 DIAGNOSIS — L97528 Non-pressure chronic ulcer of other part of left foot with other specified severity: Secondary | ICD-10-CM | POA: Diagnosis not present

## 2020-06-26 DIAGNOSIS — I110 Hypertensive heart disease with heart failure: Secondary | ICD-10-CM | POA: Diagnosis not present

## 2020-06-26 DIAGNOSIS — L97228 Non-pressure chronic ulcer of left calf with other specified severity: Secondary | ICD-10-CM | POA: Diagnosis not present

## 2020-06-26 DIAGNOSIS — I890 Lymphedema, not elsewhere classified: Secondary | ICD-10-CM | POA: Diagnosis not present

## 2020-06-26 NOTE — Progress Notes (Addendum)
Weich, ARES CARDOZO ([id number]) Visit Report for 06/26/2020 Chief Complaint Document Details Patient Name: Date of Service: [name] 06/26/2020 12:30 PM Medical Record Number: [id number] Patient Account Number: [id number] Date of Birth/Sex: Treating RN: Jan 12, 1941 (80 y.o. [name], [name] Primary Care Provider: [name] Other Clinician: Referring Provider: Treating Provider/Extender: Dreama Saa in Treatment: 2 Information Obtained from: Patient Chief Complaint 06/12/2020; patient is here for review of wounds on his bilateral lower legs and feet Electronic Signature(s) Signed: 06/26/2020 2:12:26 PM By: Worthy Keeler PA-C Entered By: Worthy Keeler on 06/26/2020 14:12:26 -------------------------------------------------------------------------------- Debridement Details Patient Name: Date of Service: [name], Brian RLES E. 06/26/2020 12:30 PM Medical Record Number: [id number] Patient Account Number: [id number] Date of Birth/Sex: Treating RN: 01/08/1941 (80 y.o. [name], [name] Primary Care Provider: [name] Other Clinician: Referring Provider: Treating Provider/Extender: Dreama Saa in Treatment: 2 Debridement Performed for Assessment: Wound #11 Left T Great oe Performed By: Physician Worthy Keeler, PA Debridement Type: Debridement Level of Consciousness (Pre-procedure): Awake and Alert Pre-procedure Verification/Time Out Yes - 14:10 Taken: Start Time: 14:10 Pain Control: Lidocaine T Area Debrided (L x W): otal 0.5 (cm) x 0.2 (cm) = 0.1 (cm) Tissue and other material debrided: Viable, Non-Viable, Slough, Subcutaneous, Skin: Dermis , Skin: Epidermis, Slough Level: Skin/Subcutaneous Tissue Debridement Description: Excisional Instrument: Curette Bleeding: Minimum Hemostasis Achieved: Pressure End Time: 14:11 Procedural Pain: 0 Post Procedural Pain: 0 Response to Treatment:  Procedure was tolerated well Level of Consciousness (Post- Awake and Alert procedure): Post Debridement Measurements of Total Wound Length: (cm) 0.5 Width: (cm) 0.2 Depth: (cm) 0.1 Volume: (cm) 0.008 Character of Wound/Ulcer Post Debridement: Improved Post Procedure Diagnosis Same as Pre-procedure Electronic Signature(s) Signed: 06/27/2020 8:28:17 AM By: Worthy Keeler PA-C Signed: 06/27/2020 5:36:09 PM By: [name] RN Entered By: [name] on 06/26/2020 17:45:21 -------------------------------------------------------------------------------- HPI Details Patient Name: Date of Service: [name], Brian RLES E. 06/26/2020 12:30 PM Medical Record Number: [id number] Patient Account Number: [id number] Date of Birth/Sex: Treating RN: 08/20/1940 (80 y.o. [name], [name] Primary Care Provider: [name] Other Clinician: Referring Provider: Treating Provider/Extender: Dreama Saa in Treatment: 2 History of Present Illness HPI Description: ADMISSION 06/12/2020 This is a 80 year old man who has had wounds on his bilateral lower legs and feet for several months now. Looking in epic he saw his primary physician and early November at which time he had an ulcer of the right calf. He was seen again by his primary doctor on 05/12/2020 at which time he had bilateral lower extremity ulcers. He was reviewed by Dr. Trula Slade of vein and vascular in the spring 2021. He had arterial studies done on 08/12/2019 showing an ABI on the right and 0.74 on the left and 0.69. TBI of the right of 0.29 on the left 0.35 with monophasic waveforms bilaterally. He also had venous reflux studies on 09/29/2019 that did not show evidence of DVT or SVT bilaterally. In the right he had superficial vein reflux in the SSV and mid thigh AASV greater saphenous vein had previously been harvested. On the left he did not have any superficial vein reflux but it was noted that he had  interstitial fluid without throughout the lower extremity. He also has a history of CABG, congestive heart failure followed by Dr. Stanford Breed and apparently liver cirrhosis. He takes Lasix 80 mg as well as spironolactone. He arrives in clinic today with 9 wounds. Most of these  are superficial and look like they started with blisters. He has areas on the left dorsal foot x2, left posterior calf left anterior tibial left posterior thigh x2 the right medial lower extremity and the right medial malleolus. He has pitting edema extending well up into his thighs skin changes look like chronic stasis dermatitis. Dr. Trula Slade saw him in May noted that he had tolerated Unna boots and was feeling better. Also noted the moderate arterial insufficiency and suggested if he developed nonhealing wounds then he may need attempted angiography. Past medical history extensive including multiple left hip surgeries, congestive heart failure, cirrhosis, peripheral arterial disease, chronic atrial fibrillation, recurrent cellulitis of the legs, AAA, carotid stenosis, COPD, hypertension and hyperlipidemia We did not attempt his arterial ABIs in our clinic today because of swelling 3/1; this is a patient I admitted to the clinic last week he has multiple wounds on his bilateral lower extremities. I think he has chronic venous insufficiency and PAD. He tolerated the "light" 3 layer wrap I put him on last week and the edema control is better. 06/26/2020 upon evaluation today patient appears to be doing well with regard to his legs overall. He does have several areas of blistering but to be perfectly honest this seems to be making some progress here. Fortunately there is no signs of active infection which is great news. No fevers, chills, nausea, vomiting, or diarrhea. He does have a lot of callus over the great toe that is going require some sharp debridement. I discussed that with him today however I am that I do this extremely  likely considering the fact that he does appear to potentially have some peripheral vascular disease specifically of the arterial type. Nonetheless he does have a an appointment on March 30 for formal arterial studies. Electronic Signature(s) Signed: 06/26/2020 2:13:00 PM By: Worthy Keeler PA-C Entered By: Worthy Keeler on 06/26/2020 14:13:00 -------------------------------------------------------------------------------- Physical Exam Details Patient Name: Date of Service: [name], Brian RLES E. 06/26/2020 12:30 PM Medical Record Number: [id number] Patient Account Number: [id number] Date of Birth/Sex: Treating RN: Feb 03, 1941 (80 y.o. [name] Primary Care Provider: [name] Other Clinician: Referring Provider: Treating Provider/Extender: Dreama Saa in Treatment: 2 Constitutional Well-nourished and well-hydrated in no acute distress. Respiratory normal breathing without difficulty. Psychiatric this patient is able to make decisions and demonstrates good insight into disease process. Alert and Oriented x 3. pleasant and cooperative. Notes Upon inspection patient's wounds again are showing signs of for the most part looking somewhat better. Fortunately there is no evidence of active infection which is great news and overall I am very pleased with where things stand at this point. No fevers, chills, nausea, vomiting, or diarrhea. I did perform a light debridement over the great toe in order to clear away some of the callus and necrotic debris. The patient tolerated that without complication and post debridement the wound bed appears to be doing much better. Electronic Signature(s) Signed: 06/26/2020 2:13:42 PM By: Worthy Keeler PA-C Entered By: Worthy Keeler on 06/26/2020 14:13:41 -------------------------------------------------------------------------------- Physician Orders Details Patient Name: Date of Service: [name], Brian RLES  E. 06/26/2020 12:30 PM Medical Record Number: [id number] Patient Account Number: [id number] Date of Birth/Sex: Treating RN: 03-Apr-1941 (80 y.o. [name] Primary Care Provider: [name] Other Clinician: Referring Provider: Treating Provider/Extender: Dreama Saa in Treatment: 2 Verbal / Phone Orders: No Diagnosis Coding ICD-10 Coding Code Description [phone] Chronic venous hypertension (idiopathic) with  ulcer and inflammation of bilateral lower extremity I70.203 Unspecified atherosclerosis of native arteries of extremities, bilateral legs L97.528 Non-pressure chronic ulcer of other part of left foot with other specified severity L97.228 Non-pressure chronic ulcer of left calf with other specified severity L97.128 Non-pressure chronic ulcer of left thigh with other specified severity L97.818 Non-pressure chronic ulcer of other part of right lower leg with other specified severity Follow-up Appointments ppointment in 1 week. - reserve 75 minutes for pt. appt. Return A Nurse Visit: - on Friday Bathing/ Shower/ Hygiene May shower with protection but do not get wound dressing(s) wet. - May use cast protectors over wraps. You can find these at [facility] or [facility] Edema Control - Lymphedema / SCD / Other Elevate legs to the level of the heart or above for 30 minutes daily and/or when sitting, a frequency of: Avoid standing for long periods of time. Non Wound Condition Protect area with: - Cover scrotal area with zinc oxide and cover with ABD pads Home Health dmit to [facility] for wound care. May utilize formulary equivalent dressing for wound treatment orders unless otherwise specified. - A [facility] Wound Treatment Wound #1 - Malleolus Wound Laterality: Right, Medial Cleanser: Wound Cleanser 2 x Per Week/15 Days Discharge Instructions: Cleanse the wound with wound cleanser prior to applying a clean dressing using gauze sponges, not tissue  or cotton balls. Cleanser: Soap and Water 2 x Per Week/15 Days Discharge Instructions: May shower and wash wound with dial antibacterial soap and water prior to dressing change. Prim Dressing: KerraCel Ag Gelling Fiber Dressing, 4x5 in (silver alginate) 2 x Per Week/15 Days ary Discharge Instructions: Apply silver alginate to wound bed as instructed Secondary Dressing: ABD Pad, 5x9 2 x Per Week/15 Days Discharge Instructions: Apply over primary dressing as directed. Compression Wrap: ThreePress (3 layer compression wrap) 2 x Per Week/15 Days Discharge Instructions: Apply LIGHTY three layer compression as directed. Wound #10 - Foot Wound Laterality: Left, Lateral Cleanser: Wound Cleanser 2 x Per Week/15 Days Discharge Instructions: Cleanse the wound with wound cleanser prior to applying a clean dressing using gauze sponges, not tissue or cotton balls. Cleanser: Soap and Water 2 x Per Week/15 Days Discharge Instructions: May shower and wash wound with dial antibacterial soap and water prior to dressing change. Prim Dressing: KerraCel Ag Gelling Fiber Dressing, 4x5 in (silver alginate) 2 x Per Week/15 Days ary Discharge Instructions: Apply silver alginate to wound bed as instructed Secondary Dressing: ABD Pad, 5x9 2 x Per Week/15 Days Discharge Instructions: Apply over primary dressing as directed. Compression Wrap: ThreePress (3 layer compression wrap) 2 x Per Week/15 Days Discharge Instructions: Apply LIGHTY three layer compression as directed. Wound #11 - T Great oe Wound Laterality: Left Cleanser: Wound Cleanser 2 x Per Week/15 Days Discharge Instructions: Cleanse the wound with wound cleanser prior to applying a clean dressing using gauze sponges, not tissue or cotton balls. Cleanser: Soap and Water 2 x Per Week/15 Days Discharge Instructions: May shower and wash wound with dial antibacterial soap and water prior to dressing change. Prim Dressing: KerraCel Ag Gelling Fiber Dressing, 4x5  in (silver alginate) 2 x Per Week/15 Days ary Discharge Instructions: Apply silver alginate to wound bed as instructed Secondary Dressing: ABD Pad, 5x9 2 x Per Week/15 Days Discharge Instructions: Apply over primary dressing as directed. Compression Wrap: ThreePress (3 layer compression wrap) 2 x Per Week/15 Days Discharge Instructions: Apply LIGHTY three layer compression as directed. Wound #2 - Lower Leg Wound Laterality: Right, Posterior Cleanser: Wound  Cleanser 2 x Per Week/15 Days Discharge Instructions: Cleanse the wound with wound cleanser prior to applying a clean dressing using gauze sponges, not tissue or cotton balls. Cleanser: Soap and Water 2 x Per Week/15 Days Discharge Instructions: May shower and wash wound with dial antibacterial soap and water prior to dressing change. Prim Dressing: KerraCel Ag Gelling Fiber Dressing, 4x5 in (silver alginate) 2 x Per Week/15 Days ary Discharge Instructions: Apply silver alginate to wound bed as instructed Secondary Dressing: ABD Pad, 5x9 2 x Per Week/15 Days Discharge Instructions: Apply over primary dressing as directed. Compression Wrap: ThreePress (3 layer compression wrap) 2 x Per Week/15 Days Discharge Instructions: Apply LIGHTY three layer compression as directed. Wound #4 - Foot Wound Laterality: Dorsal, Left Cleanser: Wound Cleanser 2 x Per Week/15 Days Discharge Instructions: Cleanse the wound with wound cleanser prior to applying a clean dressing using gauze sponges, not tissue or cotton balls. Cleanser: Soap and Water 2 x Per Week/15 Days Discharge Instructions: May shower and wash wound with dial antibacterial soap and water prior to dressing change. Prim Dressing: KerraCel Ag Gelling Fiber Dressing, 4x5 in (silver alginate) 2 x Per Week/15 Days ary Discharge Instructions: Apply silver alginate to wound bed as instructed Secondary Dressing: ABD Pad, 5x9 2 x Per Week/15 Days Discharge Instructions: Apply over primary dressing  as directed. Compression Wrap: ThreePress (3 layer compression wrap) 2 x Per Week/15 Days Discharge Instructions: Apply LIGHTY three layer compression as directed. Wound #6 - Lower Leg Wound Laterality: Left, Posterior Cleanser: Wound Cleanser 2 x Per Week/15 Days Discharge Instructions: Cleanse the wound with wound cleanser prior to applying a clean dressing using gauze sponges, not tissue or cotton balls. Cleanser: Soap and Water 2 x Per Week/15 Days Discharge Instructions: May shower and wash wound with dial antibacterial soap and water prior to dressing change. Prim Dressing: KerraCel Ag Gelling Fiber Dressing, 4x5 in (silver alginate) 2 x Per Week/15 Days ary Discharge Instructions: Apply silver alginate to wound bed as instructed Secondary Dressing: ABD Pad, 5x9 2 x Per Week/15 Days Discharge Instructions: Apply over primary dressing as directed. Compression Wrap: ThreePress (3 layer compression wrap) 2 x Per Week/15 Days Discharge Instructions: Apply LIGHTY three layer compression as directed. Wound #9 - Upper Leg Wound Laterality: Left, Posterior, Proximal Cleanser: Wound Cleanser 2 x Per Week/15 Days Discharge Instructions: Cleanse the wound with wound cleanser prior to applying a clean dressing using gauze sponges, not tissue or cotton balls. Cleanser: Soap and Water 2 x Per Week/15 Days Discharge Instructions: May shower and wash wound with dial antibacterial soap and water prior to dressing change. Prim Dressing: KerraCel Ag Gelling Fiber Dressing, 4x5 in (silver alginate) 2 x Per Week/15 Days ary Discharge Instructions: Apply silver alginate to wound bed as instructed Secondary Dressing: ComfortFoam Border, 3x3 in (silicone border) 2 x Per Week/15 Days Discharge Instructions: Apply over primary dressing as directed. Compression Wrap: ThreePress (3 layer compression wrap) 2 x Per Week/15 Days Discharge Instructions: Apply LIGHTY three layer compression as directed. Electronic  Signature(s) Signed: 06/27/2020 8:28:17 AM By: Worthy Keeler PA-C Signed: 06/27/2020 5:36:09 PM By: [name] RN Entered By: [name] on 06/26/2020 17:49:13 -------------------------------------------------------------------------------- Problem List Details Patient Name: Date of Service: [name], Brian RLES E. 06/26/2020 12:30 PM Medical Record Number: [id number] Patient Account Number: [id number] Date of Birth/Sex: Treating RN: [birth year] (80 y.o. 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 06/12/2020 No Yes L97.128 Non-pressure chronic ulcer of left thigh with other specified severity 06/12/2020 No Yes L97.818 Non-pressure chronic ulcer of other part of right lower leg with other specified 06/12/2020 No Yes severity Inactive Problems Resolved Problems Electronic Signature(s) Signed: 06/26/2020 2:12:10 PM By: Worthy Keeler PA-C Previous Signature: 06/26/2020 1:30:00 PM Version By: Worthy Keeler PA-C Entered By: Worthy Keeler on 06/26/2020 14:12:09 -------------------------------------------------------------------------------- Progress Note Details Patient Name: Date of Service: [name], Brian RLES E. 06/26/2020 12:30 PM Medical Record Number: [id number] Patient Account Number: [id number] Date of Birth/Sex: Treating RN: 03-06-1941 (80 y.o. [name], [name] Primary Care Provider: [name] Other Clinician: Referring Provider: Treating Provider/Extender: Dreama Saa in Treatment: 2 Subjective Chief Complaint Information obtained from Patient 06/12/2020; patient is here for review of wounds on his bilateral lower legs and feet History of Present Illness (HPI) ADMISSION 06/12/2020 This is a 80 year old man who has had wounds on his bilateral lower legs and feet for several months now. Looking in epic he saw his primary physician and early November at which time he had an ulcer of the right calf. He was seen again by his primary doctor on 05/12/2020 at which time he had bilateral lower extremity ulcers. He was reviewed by Dr. Trula Slade of vein and vascular in the spring 2021. He had arterial studies done on 08/12/2019 showing an ABI on the right and 0.74 on the left and 0.69. TBI of the right of 0.29 on the left 0.35 with monophasic waveforms bilaterally. He also had venous reflux studies on 09/29/2019 that did not show evidence of DVT or SVT bilaterally. In the right he had superficial vein reflux in the SSV and mid thigh AASV greater saphenous vein had previously been harvested. On the left he did not have any superficial vein reflux but it was noted that he had interstitial fluid without throughout the lower extremity. He also has a history of CABG, congestive heart failure followed by Dr. Stanford Breed and apparently liver cirrhosis. He takes Lasix 80 mg as well as spironolactone. He arrives in clinic today with 9 wounds. Most of these are superficial and look like they started with blisters. He has areas on the left dorsal foot x2, left posterior calf left anterior tibial left posterior thigh x2 the right medial lower extremity and the right medial malleolus. He has pitting edema extending well up into his thighs skin changes look like chronic stasis dermatitis. Dr. Trula Slade saw him in May noted that  he had tolerated Unna boots and was feeling better. Also noted the moderate arterial insufficiency and suggested if he developed nonhealing wounds then he may need attempted angiography. Past medical history extensive including multiple left hip surgeries, congestive heart failure, cirrhosis, peripheral arterial disease, chronic atrial fibrillation, recurrent cellulitis of the legs, AAA, carotid stenosis, COPD, hypertension and hyperlipidemia We did not attempt his arterial ABIs in our clinic today because of swelling 3/1; this is a patient I admitted to the clinic last week he has multiple wounds on his bilateral lower extremities. I think he has chronic venous insufficiency and  PAD. He tolerated the "light" 3 layer wrap I put him on last week and the edema control is better. 06/26/2020 upon evaluation today patient appears to be doing well with regard to his legs overall. He does have several areas of blistering but to be perfectly honest this seems to be making some progress here. Fortunately there is no signs of active infection which is great news. No fevers, chills, nausea, vomiting, or diarrhea. He does have a lot of callus over the great toe that is going require some sharp debridement. I discussed that with him today however I am that I do this extremely likely considering the fact that he does appear to potentially have some peripheral vascular disease specifically of the arterial type. Nonetheless he does have a an appointment on March 30 for formal arterial studies. Objective Constitutional Well-nourished and well-hydrated in no acute distress. Vitals Time Taken: 1:00 PM, Height: 71 in, Weight: 178 lbs, BMI: 24.8, Temperature: 98.7 F, Pulse: 74 bpm, Respiratory Rate: 17 breaths/min, Blood Pressure: 157/74 mmHg. Respiratory normal breathing without difficulty. Psychiatric this patient is able to make decisions and demonstrates good insight into disease process. Alert and Oriented x 3.  pleasant and cooperative. General Notes: Upon inspection patient's wounds again are showing signs of for the most part looking somewhat better. Fortunately there is no evidence of active infection which is great news and overall I am very pleased with where things stand at this point. No fevers, chills, nausea, vomiting, or diarrhea. I did perform a light debridement over the great toe in order to clear away some of the callus and necrotic debris. The patient tolerated that without complication and post debridement the wound bed appears to be doing much better. Integumentary (Hair, Skin) Wound #1 status is Open. Original cause of wound was Gradually Appeared. The date acquired was: 11/20/2019. The wound has been in treatment 2 weeks. The wound is located on the Right,Medial Malleolus. The wound measures 2.2cm length x 1.5cm width x 0.1cm depth; 2.592cm^2 area and 0.259cm^3 volume. There is Fat Layer (Subcutaneous Tissue) exposed. There is no tunneling or undermining noted. There is a medium amount of serosanguineous drainage noted. The wound margin is distinct with the outline attached to the wound base. There is small (1-33%) pink, pale granulation within the wound bed. There is a large (67- 100%) amount of necrotic tissue within the wound bed including Adherent Slough. Wound #10 status is Open. Original cause of wound was Gradually Appeared. The date acquired was: 06/26/2020. The wound is located on the Left,Lateral Foot. The wound measures 0.5cm length x 0.4cm width x 0.1cm depth; 0.157cm^2 area and 0.016cm^3 volume. There is Fat Layer (Subcutaneous Tissue) exposed. There is no tunneling or undermining noted. There is a medium amount of serous drainage noted. The wound margin is flat and intact. There is small (1-33%) pink granulation within the wound bed. There is a large (67-100%) amount of necrotic tissue within the wound bed including Adherent Slough. Wound #11 status is Open. Original cause of  wound was Gradually Appeared. The date acquired was: 06/26/2020. The wound is located on the Left T Great. The oe wound measures 0.5cm length x 0.2cm width x 0.1cm depth; 0.079cm^2 area and 0.008cm^3 volume. There is Fat Layer (Subcutaneous Tissue) exposed. There is no tunneling or undermining noted. There is a small amount of serosanguineous drainage noted. The wound margin is flat and intact. There is large (67- 100%) red granulation within the wound bed. There is no necrotic tissue within  the wound bed. Wound #2 status is Open. Original cause of wound was Gradually Appeared. The date acquired was: 11/20/2019. The wound has been in treatment 2 weeks. The wound is located on the Right,Posterior Lower Leg. The wound measures 1.8cm length x 1.8cm width x 0.1cm depth; 2.545cm^2 area and 0.254cm^3 volume. There is Fat Layer (Subcutaneous Tissue) exposed. There is no tunneling or undermining noted. There is a medium amount of purulent drainage noted. The wound margin is distinct with the outline attached to the wound base. There is small (1-33%) pink, pale granulation within the wound bed. There is a large (67- 100%) amount of necrotic tissue within the wound bed including Adherent Slough. Wound #3 status is Healed - Epithelialized. Original cause of wound was Trauma. The date acquired was: 06/04/2020. The wound has been in treatment 2 weeks. The wound is located on the Right Knee. The wound measures 0cm length x 0cm width x 0cm depth; 0cm^2 area and 0cm^3 volume. Wound #4 status is Open. Original cause of wound was Blister. The date acquired was: 11/20/2019. The wound has been in treatment 2 weeks. The wound is located on the Left,Dorsal Foot. The wound measures 1.3cm length x 3cm width x 0.1cm depth; 3.063cm^2 area and 0.306cm^3 volume. There is Fat Layer (Subcutaneous Tissue) exposed. There is no tunneling or undermining noted. There is a medium amount of purulent drainage noted. The wound margin is flat and  intact. There is small (1-33%) pink granulation within the wound bed. There is a large (67-100%) amount of necrotic tissue within the wound bed including Adherent Slough. Wound #6 status is Open. Original cause of wound was Gradually Appeared. The date acquired was: 11/20/2019. The wound has been in treatment 2 weeks. The wound is located on the Left,Posterior Lower Leg. The wound measures 2.5cm length x 2.9cm width x 0.1cm depth; 5.694cm^2 area and 0.569cm^3 volume. There is Fat Layer (Subcutaneous Tissue) exposed. There is no tunneling or undermining noted. There is a small amount of purulent drainage noted. The wound margin is flat and intact. There is small (1-33%) pink granulation within the wound bed. There is a large (67-100%) amount of necrotic tissue within the wound bed including Adherent Slough. Wound #7 status is Healed - Epithelialized. Original cause of wound was Trauma. The date acquired was: 06/04/2020. The wound has been in treatment 2 weeks. The wound is located on the Left,Proximal,Anterior Lower Leg. The wound measures 0cm length x 0cm width x 0cm depth; 0cm^2 area and 0cm^3 volume. Wound #8 status is Healed - Epithelialized. Original cause of wound was Pressure Injury. The date acquired was: 05/23/2019. The wound has been in treatment 2 weeks. The wound is located on the Left,Posterior Upper Leg. The wound measures 0cm length x 0cm width x 0cm depth; 0cm^2 area and 0cm^3 volume. Wound #9 status is Open. Original cause of wound was Pressure Injury. The date acquired was: 05/23/2019. The wound has been in treatment 2 weeks. The wound is located on the Left,Proximal,Posterior Upper Leg. The wound measures 2.2cm length x 1.6cm width x 0.1cm depth; 2.765cm^2 area and 0.276cm^3 volume. There is Fat Layer (Subcutaneous Tissue) exposed. There is no tunneling or undermining noted. There is a small amount of serosanguineous drainage noted. The wound margin is flat and intact. There is large  (67-100%) pink granulation within the wound bed. There is a small (1-33%) amount of necrotic tissue within the wound bed including Adherent Slough. 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 There was a Excisional Skin/Subcutaneous Tissue Debridement with a oe total area of 0.1 sq cm performed by Worthy Keeler, PA. With the following instrument(s): Curette to remove Viable and Non-Viable tissue/material. Material removed includes Subcutaneous Tissue, Slough, Skin: Dermis, and Skin: Epidermis after achieving pain control using Lidocaine. No specimens were taken. A time out was conducted at 14:10, prior to the start of the procedure. A Minimum amount of bleeding was controlled with Pressure. The procedure was tolerated well with a pain level of 0 throughout and a pain level of 0 following the procedure. Post Debridement Measurements: 0.5cm length x 0.2cm width x 0.1cm depth; 0.008cm^3 volume. Character of Wound/Ulcer Post Debridement is improved. Post procedure Diagnosis Wound #11: Same as Pre-Procedure Pre-procedure diagnosis of Wound #11 is a Lymphedema located on the Left T Great . There was a Three Layer Compression Therapy Procedure by Kristin Bruins, RN. 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 There was a Three Layer Compression Therapy Procedure by [name], RN. Post procedure Diagnosis Wound #1: Same as Pre-Procedure Wound #10 Pre-procedure diagnosis of Wound #10 is a Lymphedema located on the Left,Lateral Foot . There was a Three Layer Compression Therapy Procedure by [name], RN. Post procedure Diagnosis Wound #10: Same as Pre-Procedure Wound #2 Pre-procedure diagnosis of Wound #2 is a Venous Leg Ulcer located on the Right,Posterior Lower Leg . There was a Three Layer Compression Therapy Procedure by [name], RN. Post procedure Diagnosis Wound #2: Same as Pre-Procedure Wound #4 Pre-procedure diagnosis of Wound #4 is a Lymphedema located on the Left,Dorsal Foot . There was a Three Layer Compression Therapy Procedure by [name], RN. Post procedure Diagnosis Wound #4: Same as Pre-Procedure Wound #6 Pre-procedure diagnosis of Wound #6 is a Lymphedema located on the Left,Posterior Lower Leg . There was a Three Layer Compression Therapy Procedure by [name], RN. Post procedure Diagnosis Wound #6: Same as Pre-Procedure Wound #9 Pre-procedure diagnosis of Wound #9 is a Pressure Ulcer located on the Left,Proximal,Posterior Upper Leg . There was a Three Layer Compression Therapy Procedure by [name], RN. Post procedure Diagnosis Wound #9: Same as Pre-Procedure Plan Follow-up Appointments: Return Appointment in 1 week. - reserve 75 minutes for pt. appt. Nurse Visit: - on Friday Bathing/ Shower/ Hygiene: May shower with protection but do not get wound dressing(s) wet. - May use cast protectors over wraps. You can find these at [facility] or [facility] Edema Control - Lymphedema / SCD / Other: Elevate legs to the level of the heart or above for 30 minutes daily and/or when sitting, a frequency of: Avoid standing for long periods of time. Non Wound Condition: Protect area with: - Cover scrotal area with zinc  oxide and cover with ABD pads Home Health: Admit to [facility] for wound care. May utilize formulary equivalent dressing for wound treatment orders unless otherwise specified. Jackquline [place] WOUND #1: - Malleolus Wound Laterality: Right, Medial Cleanser:  Wound Cleanser 2 x Per Week/15 Days Discharge Instructions: Cleanse the wound with wound cleanser prior to applying a clean dressing using gauze sponges, not tissue or cotton balls. Cleanser: Soap and Water 2 x Per Week/15 Days Discharge Instructions: May shower and wash wound with dial antibacterial soap and water prior to dressing change. Prim Dressing: KerraCel Ag Gelling Fiber Dressing, 4x5 in (silver alginate) 2 x Per Week/15 Days ary Discharge Instructions: Apply silver alginate to wound bed as instructed Secondary Dressing: ABD Pad, 5x9 2 x Per Week/15 Days Discharge Instructions: Apply over primary dressing as directed. Com pression Wrap: ThreePress (3 layer compression wrap) 2 x Per Week/15 Days Discharge Instructions: Apply LIGHTY three layer compression as directed. WOUND #10: - Foot Wound Laterality: Left, Lateral Cleanser: Wound Cleanser 2 x Per Week/15 Days Discharge Instructions: Cleanse the wound with wound cleanser prior to applying a clean dressing using gauze sponges, not tissue or cotton balls. Cleanser: Soap and Water 2 x Per Week/15 Days Discharge Instructions: May shower and wash wound with dial antibacterial soap and water prior to dressing change. Prim Dressing: KerraCel Ag Gelling Fiber Dressing, 4x5 in (silver alginate) 2 x Per Week/15 Days ary Discharge Instructions: Apply silver alginate to wound bed as instructed Secondary Dressing: ABD Pad, 5x9 2 x Per Week/15 Days Discharge Instructions: Apply over primary dressing as directed. Com pression Wrap: ThreePress (3 layer compression wrap) 2 x Per Week/15 Days Discharge Instructions: Apply LIGHTY three layer compression as directed. WOUND #11: - T Great Wound  Laterality: Left oe Cleanser: Wound Cleanser 2 x Per Week/15 Days Discharge Instructions: Cleanse the wound with wound cleanser prior to applying a clean dressing using gauze sponges, not tissue or cotton balls. Cleanser: Soap and Water 2 x Per Week/15 Days Discharge Instructions: May shower and wash wound with dial antibacterial soap and water prior to dressing change. Prim Dressing: KerraCel Ag Gelling Fiber Dressing, 4x5 in (silver alginate) 2 x Per Week/15 Days ary Discharge Instructions: Apply silver alginate to wound bed as instructed Secondary Dressing: ABD Pad, 5x9 2 x Per Week/15 Days Discharge Instructions: Apply over primary dressing as directed. Com pression Wrap: ThreePress (3 layer compression wrap) 2 x Per Week/15 Days Discharge Instructions: Apply LIGHTY three layer compression as directed. WOUND #2: - Lower Leg Wound Laterality: Right, Posterior Cleanser: Wound Cleanser 2 x Per Week/15 Days Discharge Instructions: Cleanse the wound with wound cleanser prior to applying a clean dressing using gauze sponges, not tissue or cotton balls. Cleanser: Soap and Water 2 x Per Week/15 Days Discharge Instructions: May shower and wash wound with dial antibacterial soap and water prior to dressing change. Prim Dressing: KerraCel Ag Gelling Fiber Dressing, 4x5 in (silver alginate) 2 x Per Week/15 Days ary Discharge Instructions: Apply silver alginate to wound bed as instructed Secondary Dressing: ABD Pad, 5x9 2 x Per Week/15 Days Discharge Instructions: Apply over primary dressing as directed. Com pression Wrap: ThreePress (3 layer compression wrap) 2 x Per Week/15 Days Discharge Instructions: Apply LIGHTY three layer compression as directed. WOUND #4: - Foot Wound Laterality: Dorsal, Left Cleanser: Wound Cleanser 2 x Per Week/15 Days Discharge Instructions: Cleanse the wound with wound cleanser prior to applying a clean dressing using gauze sponges, not tissue or cotton  balls. Cleanser: Soap and Water 2 x Per Week/15 Days Discharge Instructions: May shower and wash wound with dial antibacterial soap and water prior to dressing change. Prim Dressing: KerraCel Ag Gelling Fiber Dressing, 4x5 in (silver alginate) 2 x Per Week/15 Days ary  Discharge Instructions: Apply silver alginate to wound bed as instructed Secondary Dressing: ABD Pad, 5x9 2 x Per Week/15 Days Discharge Instructions: Apply over primary dressing as directed. Com pression Wrap: ThreePress (3 layer compression wrap) 2 x Per Week/15 Days Discharge Instructions: Apply LIGHTY three layer compression as directed. WOUND #6: - Lower Leg Wound Laterality: Left, Posterior Cleanser: Wound Cleanser 2 x Per Week/15 Days Discharge Instructions: Cleanse the wound with wound cleanser prior to applying a clean dressing using gauze sponges, not tissue or cotton balls. Cleanser: Soap and Water 2 x Per Week/15 Days Discharge Instructions: May shower and wash wound with dial antibacterial soap and water prior to dressing change. Prim Dressing: KerraCel Ag Gelling Fiber Dressing, 4x5 in (silver alginate) 2 x Per Week/15 Days ary Discharge Instructions: Apply silver alginate to wound bed as instructed Secondary Dressing: ABD Pad, 5x9 2 x Per Week/15 Days Discharge Instructions: Apply over primary dressing as directed. Com pression Wrap: ThreePress (3 layer compression wrap) 2 x Per Week/15 Days Discharge Instructions: Apply LIGHTY three layer compression as directed. WOUND #9: - Upper Leg Wound Laterality: Left, Posterior, Proximal Cleanser: Wound Cleanser 2 x Per Week/15 Days Discharge Instructions: Cleanse the wound with wound cleanser prior to applying a clean dressing using gauze sponges, not tissue or cotton balls. Cleanser: Soap and Water 2 x Per Week/15 Days Discharge Instructions: May shower and wash wound with dial antibacterial soap and water prior to dressing change. Prim Dressing: KerraCel Ag Gelling  Fiber Dressing, 4x5 in (silver alginate) 2 x Per Week/15 Days ary Discharge Instructions: Apply silver alginate to wound bed as instructed Secondary Dressing: ComfortFoam Border, 3x3 in (silicone border) 2 x Per Week/15 Days Discharge Instructions: Apply over primary dressing as directed. Com pression Wrap: ThreePress (3 layer compression wrap) 2 x Per Week/15 Days Discharge Instructions: Apply LIGHTY three layer compression as directed. 1. Would recommend at this point that we have the patient go ahead and continue with the wound care measures as before and he is in agreement with the plan this includes the use of a silver alginate dressing across the board on all wounds. 2. We have also been doing a light 3 layer compression wrap with the above recommended treatments currently to help with edema control. 3. I am also can recommend the patient should continue to elevate his legs much as possible try to keep edema under good control. We will see patient back for reevaluation in 1 week here in the clinic. If anything worsens or changes patient will contact our office for additional recommendations. Electronic Signature(s) Signed: 06/28/2020 5:16:58 PM By: Levan Hurst RN, BSN Signed: 06/28/2020 5:28:00 PM By: Worthy Keeler PA-C Previous Signature: 06/26/2020 2:14:20 PM Version By: Worthy Keeler PA-C Entered By: Levan Hurst on 06/28/2020 16:54:13 -------------------------------------------------------------------------------- SuperBill Details Patient Name: Date of Service: [name], Brian RLES E. 06/26/2020 Medical Record Number: [id number] Patient Account Number: [id number] Date of Birth/Sex: Treating RN: 17-Sep-1940 (80 y.o. 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 L97.818 Non-pressure chronic ulcer of  other part of right lower leg with other specified severity Facility Procedures CPT4 Code: [id number] Description: 65035 - DEB SUBQ TISSUE 20 SQ CM/< ICD-10 Diagnosis Description L97.528 Non-pressure chronic ulcer of other part of left foot with other specified seve Modifier: rity Quantity: 1 Physician Procedures : CPT4 Code Description Modifier [id number] 11042 - WC PHYS SUBQ TISS 20 SQ CM ICD-10 Diagnosis Description L97.528 Non-pressure chronic ulcer of other part of left foot with other specified severity Quantity: 1 Electronic Signature(s) Signed: 06/26/2020 2:15:19 PM By: Worthy Keeler PA-C Entered By: Worthy Keeler on 06/26/2020 14:15:17

## 2020-06-27 DIAGNOSIS — G809 Cerebral palsy, unspecified: Secondary | ICD-10-CM | POA: Diagnosis not present

## 2020-06-27 DIAGNOSIS — I739 Peripheral vascular disease, unspecified: Secondary | ICD-10-CM | POA: Diagnosis not present

## 2020-06-27 DIAGNOSIS — M549 Dorsalgia, unspecified: Secondary | ICD-10-CM | POA: Diagnosis not present

## 2020-06-27 DIAGNOSIS — J441 Chronic obstructive pulmonary disease with (acute) exacerbation: Secondary | ICD-10-CM | POA: Diagnosis not present

## 2020-06-29 ENCOUNTER — Other Ambulatory Visit: Payer: Self-pay

## 2020-06-29 ENCOUNTER — Encounter (HOSPITAL_BASED_OUTPATIENT_CLINIC_OR_DEPARTMENT_OTHER): Payer: Medicare Other | Admitting: Physician Assistant

## 2020-06-29 DIAGNOSIS — L97228 Non-pressure chronic ulcer of left calf with other specified severity: Secondary | ICD-10-CM | POA: Diagnosis not present

## 2020-06-29 DIAGNOSIS — I890 Lymphedema, not elsewhere classified: Secondary | ICD-10-CM | POA: Diagnosis not present

## 2020-06-29 DIAGNOSIS — I87333 Chronic venous hypertension (idiopathic) with ulcer and inflammation of bilateral lower extremity: Secondary | ICD-10-CM | POA: Diagnosis not present

## 2020-06-29 DIAGNOSIS — L97818 Non-pressure chronic ulcer of other part of right lower leg with other specified severity: Secondary | ICD-10-CM | POA: Diagnosis not present

## 2020-06-29 DIAGNOSIS — I110 Hypertensive heart disease with heart failure: Secondary | ICD-10-CM | POA: Diagnosis not present

## 2020-06-29 DIAGNOSIS — L97528 Non-pressure chronic ulcer of other part of left foot with other specified severity: Secondary | ICD-10-CM | POA: Diagnosis not present

## 2020-06-29 DIAGNOSIS — I509 Heart failure, unspecified: Secondary | ICD-10-CM | POA: Diagnosis not present

## 2020-06-29 NOTE — Progress Notes (Signed)
Mcdowell, Brian ARVELO ([id number]) Visit Report for 06/29/2020 SuperBill Details Patient Name: Date of Service: [name], [name] 06/29/2020 Medical Record Number: [id number] Patient Account Number: [id number] Date of Birth/Sex: Treating RN: Oct 15, 1940 (80 y.o. 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 Electronic Signature(s) Signed: 06/29/2020 5:22:03 PM By: [name] RN Signed: 06/29/2020 7:21:59 PM By: Worthy Keeler PA-C Entered By: [name] on 06/29/2020 12:05:26

## 2020-06-29 NOTE — Progress Notes (Signed)
[name] ([id number]) Visit Report for 06/26/2020 Arrival Information Details Patient Name: Date of Service: [name] 06/26/2020 12:30 PM Medical Record Number: [id number] Patient Account Number: [id number] Date of Birth/Sex: Treating RN: 08/01/1940 (80 y.o. [name], [name] Primary Care Emalina Dubreuil: [name] Other Clinician: Referring Srija Southard: Treating [name] [name]/Extender: Dreama Saa in Treatment: 2 Visit Information History Since Last Visit Added or deleted any medications: No Patient Arrived: Wheel Chair Any new allergies or adverse reactions: No Arrival Time: 12:59 Had a fall or experienced change in No Accompanied By: self activities of daily living that may affect Transfer Assistance: None risk of falls: Patient Identification Verified: Yes Signs or symptoms of abuse/neglect since last visito No Secondary Verification Process Completed: Yes Hospitalized since last visit: No Patient Requires Transmission-Based Precautions: No Implantable device outside of the clinic excluding No Patient Has Alerts: No cellular tissue based products placed in the center since last visit: Has Dressing in Place as Prescribed: Yes Pain Present Now: Yes Electronic Signature(s) Signed: 06/27/2020 5:36:09 PM By: [name] RN Entered By: [name] on 06/26/2020 13:00:03 -------------------------------------------------------------------------------- Compression Therapy Details Patient Name: Date of Service: [name], Brian RLES E. 06/26/2020 12:30 PM Medical Record Number: [id number] Patient Account Number: [id number] Date of Birth/Sex: Treating RN: 08-11-1940 (80 y.o. [name] Primary Care Claudene Gatliff: [name] Other Clinician: Referring Niang Mitcheltree: Treating Danaka Llera/Extender: Dreama Saa in Treatment: 2 Compression Therapy Performed for Wound Assessment: Wound #1 Right,Medial  Malleolus Performed By: Clinician [name], RN Compression Type: Three Layer Post Procedure Diagnosis Same as Pre-procedure Electronic Signature(s) Signed: 06/27/2020 5:36:09 PM By: [name] RN Entered By: [name] on 06/26/2020 17:45:57 -------------------------------------------------------------------------------- Compression Therapy Details Patient Name: Date of Service: [name], Brian RLES E. 06/26/2020 12:30 PM Medical Record Number: [id number] Patient Account Number: [id number] Date of Birth/Sex: Treating RN: May 02, 1940 (80 y.o. [name] Primary Care Tandra Rosado: [name] Other Clinician: Referring Jaidan Prevette: Treating Bobie Kistler/Extender: Dreama Saa in Treatment: 2 Compression Therapy Performed for Wound Assessment: Wound #10 Left,Lateral Foot Performed By: Clinician [name], RN Compression Type: Three Layer Post Procedure Diagnosis Same as Pre-procedure Electronic Signature(s) Signed: 06/27/2020 5:36:09 PM By: [name] RN Entered By: [name] on 06/26/2020 17:45:57 -------------------------------------------------------------------------------- Compression Therapy Details Patient Name: Date of Service: [name], Brian RLES E. 06/26/2020 12:30 PM Medical Record Number: [id number] Patient Account Number: [id number] Date of Birth/Sex: Treating RN: December 25, 1940 (80 y.o. [name] Primary Care Libero Puthoff: [name] Other Clinician: Referring Eathel Pajak: Treating Deneise Getty/Extender: Dreama Saa in Treatment: 2 Compression Therapy Performed for Wound Assessment: Wound #11 Left T Great oe Performed By: Clinician [name], RN Compression Type: Three Layer Post Procedure Diagnosis Same as Pre-procedure Electronic Signature(s) Signed: 06/27/2020 5:36:09 PM By: [name] RN Entered By: [name] on 06/26/2020  17:45:57 -------------------------------------------------------------------------------- Compression Therapy Details Patient Name: Date of Service: [name], Brian RLES E. 06/26/2020 12:30 PM Medical Record Number: [id number] Patient Account Number: [id number] Date of Birth/Sex: Treating RN: 05-Apr-1941 (80 y.o. [name] Primary Care Vera Furniss: [name] Other Clinician: Referring Shatyra Becka: Treating Erandi Lemma/Extender: Dreama Saa in Treatment: 2 Compression Therapy Performed for Wound Assessment: Wound #2 Right,Posterior Lower Leg Performed By: Clinician [name], RN Compression Type: Three Layer Post Procedure Diagnosis Same as Pre-procedure Electronic Signature(s) Signed: 06/27/2020 5:36:09 PM By: [name] RN Signed: 06/27/2020 5:36:09 PM By: [name] RN Entered By: [name] on 06/26/2020  17:45:57 -------------------------------------------------------------------------------- Compression Therapy Details Patient Name: Date of Service: [name] 06/26/2020 12:30 PM Medical Record Number: [id number] Patient Account Number: [id number] Date of Birth/Sex: Treating RN: 10/12/1940 (80 y.o. [name] Primary Care Cyndel Griffey: [name] Other Clinician: Referring Saidee Geremia: Treating Kristol Almanzar/Extender: Dreama Saa in Treatment: 2 Compression Therapy Performed for Wound Assessment: Wound #4 Left,Dorsal Foot Performed By: Clinician [name], RN Compression Type: Three Layer Post Procedure Diagnosis Same as Pre-procedure Electronic Signature(s) Signed: 06/27/2020 5:36:09 PM By: [name] RN Entered By: [name] on 06/26/2020 17:45:57 -------------------------------------------------------------------------------- Compression Therapy Details Patient Name: Date of Service: [name], Brian RLES E. 06/26/2020 12:30 PM Medical Record  Number: [id number] Patient Account Number: [id number] Date of Birth/Sex: Treating RN: 06-May-1940 (80 y.o. [name] Primary Care Lattie Cervi: [name] Other Clinician: Referring Shantaya Bluestone: Treating Devonte Migues/Extender: Dreama Saa in Treatment: 2 Compression Therapy Performed for Wound Assessment: Wound #6 Left,Posterior Lower Leg Performed By: Clinician [name], RN Compression Type: Three Layer Post Procedure Diagnosis Same as Pre-procedure Electronic Signature(s) Signed: 06/27/2020 5:36:09 PM By: [name] RN Entered By: [name] on 06/26/2020 17:45:57 -------------------------------------------------------------------------------- Compression Therapy Details Patient Name: Date of Service: [name], Brian RLES E. 06/26/2020 12:30 PM Medical Record Number: [id number] Patient Account Number: [id number] Date of Birth/Sex: Treating RN: 1941/01/11 (80 y.o. [name] Primary Care Brenlyn Beshara: [name] Other Clinician: Referring Dalanie Kisner: Treating Hazim Treadway/Extender: Dreama Saa in Treatment: 2 Compression Therapy Performed for Wound Assessment: Wound #9 Left,Proximal,Posterior Upper Leg Performed By: Clinician [name], RN Compression Type: Three Layer Post Procedure Diagnosis Same as Pre-procedure Electronic Signature(s) Signed: 06/27/2020 5:36:09 PM By: [name] RN Entered By: [name] on 06/26/2020 17:45:57 -------------------------------------------------------------------------------- Encounter Discharge Information Details Patient Name: Date of Service: [name], Brian RLES E. 06/26/2020 12:30 PM Medical Record Number: [id number] Patient Account Number: [id number] Date of Birth/Sex: Treating RN: 05-05-1940 (80 y.o. [name] Primary Care Romyn Boswell: [name] Other Clinician: Referring Truong Delcastillo: Treating  Harlan Ervine/Extender: Dreama Saa in Treatment: 2 Encounter Discharge Information Items Post Procedure Vitals Discharge Condition: Stable Temperature (F): 98.7 Ambulatory Status: Wheelchair Pulse (bpm): 74 Discharge Destination: Home Respiratory Rate (breaths/min): 17 Transportation: Private Auto Blood Pressure (mmHg): 157/74 Accompanied By: self Schedule Follow-up Appointment: Yes Clinical Summary of Care: Electronic Signature(s) Signed: 06/26/2020 5:55:08 PM By: [name] Entered By: [name] on 06/26/2020 17:51:36 -------------------------------------------------------------------------------- Lower Extremity Assessment Details Patient Name: Date of Service: [name], Brian RLES E. 06/26/2020 12:30 PM Medical Record Number: [id number] Patient Account Number: [id number] Date of Birth/Sex: Treating RN: 03-20-1941 (80 y.o. [name], [name] Primary Care Mattelyn Imhoff: [name] Other Clinician: Referring Geovonni Meyerhoff: Treating Mikel Pyon/Extender: Dreama Saa in Treatment: 2 Edema Assessment Assessed: [name]: Yes] [name]: Yes] Edema: [Left: Yes] [Right: Yes] Calf Left: Right: Point of Measurement: From Medial Instep 30 cm 28 cm Ankle Left: Right: Point of Measurement: From Medial Instep 20 cm 21 cm Vascular Assessment Pulses: Dorsalis Pedis Palpable: [Left:Yes] [Right:Yes] Posterior Tibial Palpable: [Left:Yes] [Right:Yes] Blood Pressure: Brachial: [Right:153 153] Ankle: [Right:Dorsalis Pedis: 92 0.60] Notes right ABI [place] >259mmHG. Electronic Signature(s) Signed: 06/27/2020 5:36:09 PM By: [name] RN Entered By: [name] on 06/26/2020 13:15:21 -------------------------------------------------------------------------------- [place] Details Patient Name: Date of Service: [name], Brian RLES E. 06/26/2020 12:30 PM Medical Record Number: [id number] Patient Account Number:  [id number] Date of Birth/Sex: Treating RN: [birth year] (80 y.o. [name] Primary Care Zalan Shidler:  [name] Other Clinician: Referring Jodeci Roarty: Treating Toni Demo/Extender: Dreama Saa in Treatment: 2 Active Inactive Wound/Skin Impairment Nursing Diagnoses: Impaired tissue integrity Knowledge deficit related to ulceration/compromised skin integrity Goals: Patient will have a decrease in wound volume by X% from date: (specify in notes) Date Initiated: 06/12/2020 Target Resolution Date: 06/29/2020 Goal Status: Active Patient/caregiver will verbalize understanding of skin care regimen Date Initiated: 06/12/2020 Target Resolution Date: 06/29/2020 Goal Status: Active Ulcer/skin breakdown will have a volume reduction of 30% by week 4 Date Initiated: 06/12/2020 Target Resolution Date: 06/29/2020 Goal Status: Active Interventions: Assess patient/caregiver ability to obtain necessary supplies Assess patient/caregiver ability to perform ulcer/skin care regimen upon admission and as needed Assess ulceration(s) every visit Notes: Electronic Signature(s) Signed: 06/27/2020 5:36:09 PM By: [name] RN Entered By: [name] on 06/26/2020 17:47:08 -------------------------------------------------------------------------------- Pain Assessment Details Patient Name: Date of Service: [name], Brian RLES E. 06/26/2020 12:30 PM Medical Record Number: [id number] Patient Account Number: [id number] Date of Birth/Sex: Treating RN: 1940-10-16 (80 y.o. [name], [name] Primary Care Doll Frazee: [name] Other Clinician: Referring Aliviana Burdell: Treating Manasseh Pittsley/Extender: Dreama Saa in Treatment: 2 Active Problems Location of Pain Severity and Description of Pain Patient Has Paino Yes Site Locations Pain Location: Generalized Pain, Pain in Ulcers With Dressing Change: Yes Duration of the  Pain. Constant / Intermittento Constant Rate the pain. Current Pain Level: 8 Worst Pain Level: 10 Least Pain Level: 0 Tolerable Pain Level: 8 Character of Pain Describe the Pain: Aching Pain Management and Medication Current Pain Management: Medication: Yes Cold Application: No Rest: Yes Massage: No Activity: No T.E.N.S.: No Heat Application: No Leg drop or elevation: No Is the Current Pain Management Adequate: Adequate How does your wound impact your activities of daily livingo Sleep: No Bathing: No Appetite: No Relationship With Others: No Bladder Continence: No Emotions: No Bowel Continence: No Work: No Toileting: No Drive: No Dressing: No Hobbies: No Electronic Signature(s) Signed: 06/27/2020 5:36:09 PM By: [name] RN Entered By: [name] on 06/26/2020 13:01:10 -------------------------------------------------------------------------------- Patient/Caregiver Education Details Patient Name: Date of Service: [name], Brian Lamar Blinks 3/8/2022andnbsp12:30 PM Medical Record Number: [id number] Patient Account Number: [id number] Date of Birth/Gender: Treating RN: March 01, 1941 (80 y.o. [name] Primary Care Physician: [name] Other Clinician: Referring Physician: Treating Physician/Extender: Dreama Saa in Treatment: 2 Education Assessment Education Provided To: Patient Education Topics Provided Wound/Skin Impairment: Methods: Explain/Verbal Responses: State content correctly Electronic Signature(s) Signed: 06/27/2020 5:36:09 PM By: [name] RN Entered By: [name] on 06/26/2020 17:47:22 -------------------------------------------------------------------------------- Wound Assessment Details Patient Name: Date of Service: [name], Brian RLES E. 06/26/2020 12:30 PM Medical Record Number: [id number] Patient Account Number: [id number] Date of Birth/Sex: Treating RN: 1940/04/29 (80  y.o. [name], [place] Primary Care Leean Amezcua: [name] Other Clinician: Referring Marwin Primmer: Treating Katheren Jimmerson/Extender: Dreama Saa in Treatment: 2 Wound Status Wound Number: 1 Primary Venous Leg Ulcer Etiology: Wound Location: Right, Medial Malleolus Secondary Lymphedema Wounding Event: Gradually Appeared Etiology: Date Acquired: 11/20/2019 Wound Open Weeks Of Treatment: 2 Status: Clustered Wound: No Comorbid Anemia, Chronic Obstructive Pulmonary Disease (COPD), History: Arrhythmia, Congestive Heart Failure, Coronary Artery Disease, Hypertension, Cirrhosis , Osteoarthritis Photos Wound Measurements Length: (cm) 2.2 Width: (cm) 1.5 Depth: (cm) 0.1 Area: (cm) 2.592 Volume: (cm) 0.259 % Reduction in Area: 78.3% % Reduction in Volume: 78.3% Epithelialization: Medium (34-66%) Tunneling: No Undermining: No Wound Description Classification: Full Thickness Without Exposed Support Structures Wound Margin: Distinct, outline attached Exudate  Amount: Medium Exudate Type: Serosanguineous Exudate Color: red, brown Foul Odor After Cleansing: No Slough/Fibrino Yes Wound Bed Granulation Amount: Small (1-33%) Exposed Structure Granulation Quality: Pink, Pale Fascia Exposed: No Necrotic Amount: Large (67-100%) Fat Layer (Subcutaneous Tissue) Exposed: Yes Necrotic Quality: Adherent Slough Tendon Exposed: No Muscle Exposed: No Joint Exposed: No Bone Exposed: No Treatment Notes Wound #1 (Malleolus) Wound Laterality: Right, Medial Cleanser Wound Cleanser Discharge Instruction: Cleanse the wound with wound cleanser prior to applying a clean dressing using gauze sponges, not tissue or cotton balls. Soap and Water Discharge Instruction: May shower and wash wound with dial antibacterial soap and water prior to dressing change. Peri-Wound Care Topical Primary Dressing KerraCel Ag Gelling Fiber Dressing, 4x5 in (silver  alginate) Discharge Instruction: Apply silver alginate to wound bed as instructed Secondary Dressing ABD Pad, 5x9 Discharge Instruction: Apply over primary dressing as directed. Secured With Compression Wrap ThreePress (3 layer compression wrap) Discharge Instruction: Apply LIGHTY three layer compression as directed. Compression Stockings Add-Ons Electronic Signature(s) Signed: 06/27/2020 4:22:28 PM By: Sandre Kitty Signed: 06/27/2020 5:36:09 PM By: [name] RN Entered By: Sandre Kitty on 06/27/2020 11:36:47 -------------------------------------------------------------------------------- Wound Assessment Details Patient Name: Date of Service: [name], Brian RLES E. 06/26/2020 12:30 PM Medical Record Number: [id number] Patient Account Number: [id number] Date of Birth/Sex: Treating RN: [birth year] (80 y.o. [name] Primary Care Jazelyn Sipe: [name] Other Clinician: Referring Lucia Harm: Treating Kismet Facemire/Extender: Dreama Saa in Treatment: 2 Wound Status Wound Number: 10 Primary Lymphedema Etiology: Wound Location: Left, Lateral Foot Wound Open Wounding Event: Gradually Appeared Status: Date Acquired: 06/26/2020 Comorbid Anemia, Chronic Obstructive Pulmonary Disease (COPD), Weeks Of Treatment: 0 History: Arrhythmia, Congestive Heart Failure, Coronary Artery Disease, Clustered Wound: No Hypertension, Cirrhosis , Osteoarthritis Photos Wound Measurements Length: (cm) 0.5 Width: (cm) 0.4 Depth: (cm) 0.1 Area: (cm) 0.157 Volume: (cm) 0.016 % Reduction in Area: 0% % Reduction in Volume: 0% Epithelialization: Small (1-33%) Tunneling: No Undermining: No Wound Description Classification: Full Thickness Without Exposed Support Structures Wound Margin: Flat and Intact Exudate Amount: Medium Exudate Type: Serous Exudate Color: amber Foul Odor After Cleansing: No Slough/Fibrino Yes Wound Bed Granulation Amount: Small  (1-33%) Exposed Structure Granulation Quality: Pink Fascia Exposed: No Necrotic Amount: Large (67-100%) Fat Layer (Subcutaneous Tissue) Exposed: Yes Necrotic Quality: Adherent Slough Tendon Exposed: No Muscle Exposed: No Joint Exposed: No Bone Exposed: No Treatment Notes Wound #10 (Foot) Wound Laterality: Left, Lateral Cleanser Wound Cleanser Discharge Instruction: Cleanse the wound with wound cleanser prior to applying a clean dressing using gauze sponges, not tissue or cotton balls. Soap and Water Discharge Instruction: May shower and wash wound with dial antibacterial soap and water prior to dressing change. Peri-Wound Care Topical Primary Dressing KerraCel Ag Gelling Fiber Dressing, 4x5 in (silver alginate) Discharge Instruction: Apply silver alginate to wound bed as instructed Secondary Dressing ABD Pad, 5x9 Discharge Instruction: Apply over primary dressing as directed. Secured With Compression Wrap ThreePress (3 layer compression wrap) Discharge Instruction: Apply LIGHTY three layer compression as directed. Compression Stockings Add-Ons Electronic Signature(s) Signed: 06/27/2020 4:22:28 PM By: Sandre Kitty Signed: 06/29/2020 6:00:02 PM By: Baruch Gouty RN, BSN Previous Signature: 06/26/2020 6:01:57 PM Version By: Baruch Gouty RN, BSN Entered By: Sandre Kitty on 06/27/2020 11:43:43 -------------------------------------------------------------------------------- Wound Assessment Details Patient Name: Date of Service: [name], Brian RLES E. 06/26/2020 12:30 PM Medical Record Number: [id number] Patient Account Number: [id number] Date of Birth/Sex: Treating RN: 1940/12/09 (80 y.o. [name] Primary Care Ariadne Rissmiller: [name] Other Clinician: Referring Cressie Betzler: Treating Javaria Knapke/Extender:  Stone III, Alla German, Elizabeth Weeks in Treatment: 2 Wound Status Wound Number: 11 Primary Lymphedema Etiology: Wound Location: Left T  Great oe Wound Open Wounding Event: Gradually Appeared Status: Date Acquired: 06/26/2020 Comorbid Anemia, Chronic Obstructive Pulmonary Disease (COPD), Weeks Of Treatment: 0 History: Arrhythmia, Congestive Heart Failure, Coronary Artery Disease, Clustered Wound: No Hypertension, Cirrhosis , Osteoarthritis Wound Measurements Length: (cm) 0.5 Width: (cm) 0.2 Depth: (cm) 0.1 Area: (cm) 0.079 Volume: (cm) 0.008 % Reduction in Area: % Reduction in Volume: Epithelialization: Small (1-33%) Tunneling: No Undermining: No Wound Description Classification: Full Thickness Without Exposed Support Structures Wound Margin: Flat and Intact Exudate Amount: Small Exudate Type: Serosanguineous Exudate Color: red, brown Foul Odor After Cleansing: No Slough/Fibrino No Wound Bed Granulation Amount: Large (67-100%) Exposed Structure Granulation Quality: Red Fascia Exposed: No Necrotic Amount: None Present (0%) Fat Layer (Subcutaneous Tissue) Exposed: Yes Tendon Exposed: No Muscle Exposed: No Joint Exposed: No Bone Exposed: No Treatment Notes Wound #11 (Toe Great) Wound Laterality: Left Cleanser Wound Cleanser Discharge Instruction: Cleanse the wound with wound cleanser prior to applying a clean dressing using gauze sponges, not tissue or cotton balls. Soap and Water Discharge Instruction: May shower and wash wound with dial antibacterial soap and water prior to dressing change. Peri-Wound Care Topical Primary Dressing KerraCel Ag Gelling Fiber Dressing, 4x5 in (silver alginate) Discharge Instruction: Apply silver alginate to wound bed as instructed Secondary Dressing ABD Pad, 5x9 Discharge Instruction: Apply over primary dressing as directed. Secured With Compression Wrap ThreePress (3 layer compression wrap) Discharge Instruction: Apply LIGHTY three layer compression as directed. Compression Stockings Add-Ons Electronic Signature(s) Signed: 06/26/2020 6:01:57 PM By: Baruch Gouty RN, BSN Entered By: Baruch Gouty on 06/26/2020 14:39:14 -------------------------------------------------------------------------------- Wound Assessment Details Patient Name: Date of Service: [name], Brian RLES E. 06/26/2020 12:30 PM Medical Record Number: [id number] Patient Account Number: [id number] Date of Birth/Sex: Treating RN: [birth year] (80 y.o. [name], [name] Primary Care Lyric Rossano: [name] Other Clinician: Referring Sandie Swayze: Treating Ammon Muscatello/Extender: Dreama Saa in Treatment: 2 Wound Status Wound Number: 2 Primary Venous Leg Ulcer Etiology: Wound Location: Right, Posterior Lower Leg Secondary Lymphedema Wounding Event: Gradually Appeared Etiology: Date Acquired: 11/20/2019 Wound Open Weeks Of Treatment: 2 Status: Clustered Wound: No Comorbid Anemia, Chronic Obstructive Pulmonary Disease (COPD), History: Arrhythmia, Congestive Heart Failure, Coronary Artery Disease, Hypertension, Cirrhosis , Osteoarthritis Photos Wound Measurements Length: (cm) 1.8 Width: (cm) 1.8 Depth: (cm) 0.1 Area: (cm) 2.545 Volume: (cm) 0.254 % Reduction in Area: 4.7% % Reduction in Volume: 4.9% Epithelialization: Small (1-33%) Tunneling: No Undermining: No Wound Description Classification: Full Thickness Without Exposed Support Structures Wound Margin: Distinct, outline attached Exudate Amount: Medium Exudate Type: Purulent Exudate Color: yellow, brown, green Foul Odor After Cleansing: No Slough/Fibrino Yes Wound Bed Granulation Amount: Small (1-33%) Exposed Structure Granulation Quality: Pink, Pale Fascia Exposed: No Necrotic Amount: Large (67-100%) Fat Layer (Subcutaneous Tissue) Exposed: Yes Necrotic Quality: Adherent Slough Tendon Exposed: No Muscle Exposed: No Joint Exposed: No Bone Exposed: No Treatment Notes Wound #2 (Lower Leg) Wound Laterality: Right, Posterior Cleanser Wound Cleanser Discharge  Instruction: Cleanse the wound with wound cleanser prior to applying a clean dressing using gauze sponges, not tissue or cotton balls. Soap and Water Discharge Instruction: May shower and wash wound with dial antibacterial soap and water prior to dressing change. Peri-Wound Care Topical Primary Dressing KerraCel Ag Gelling Fiber Dressing, 4x5 in (silver alginate) Discharge Instruction: Apply silver alginate to wound bed as instructed Secondary Dressing ABD Pad, 5x9 Discharge Instruction: Apply over primary  dressing as directed. Secured With Compression Wrap ThreePress (3 layer compression wrap) Discharge Instruction: Apply LIGHTY three layer compression as directed. Compression Stockings Add-Ons Electronic Signature(s) Signed: 06/27/2020 4:22:28 PM By: Sandre Kitty Signed: 06/27/2020 5:36:09 PM By: [name] RN Entered By: Sandre Kitty on 06/27/2020 11:37:51 -------------------------------------------------------------------------------- Wound Assessment Details Patient Name: Date of Service: [name], Brian RLES E. 06/26/2020 12:30 PM Medical Record Number: [id number] Patient Account Number: [id number] Date of Birth/Sex: Treating RN: 06/29/1940 (80 y.o. [name], [name] Primary Care Aarionna Germer: [name] Other Clinician: Referring Doll Frazee: Treating Lynita Groseclose/Extender: Dreama Saa in Treatment: 2 Wound Status Wound Number: 3 Primary Etiology: Abrasion Wound Location: Right Knee Wound Status: Healed - Epithelialized Wounding Event: Trauma Date Acquired: 06/04/2020 Weeks Of Treatment: 2 Clustered Wound: No Wound Measurements Length: (cm) Width: (cm) Depth: (cm) Area: (cm) Volume: (cm) 0 % Reduction in Area: 100% 0 % Reduction in Volume: 100% 0 0 0 Wound Description Classification: Full Thickness Without Exposed Support Structur es Treatment Notes Wound #3 (Knee) Wound Laterality: Right Cleanser Peri-Wound  Care Topical Primary Dressing Secondary Dressing Secured With Compression Wrap Compression Stockings Add-Ons Electronic Signature(s) Signed: 06/27/2020 5:36:09 PM By: [name] RN Entered By: [name] on 06/26/2020 13:16:42 -------------------------------------------------------------------------------- Wound Assessment Details Patient Name: Date of Service: [name], Brian RLES E. 06/26/2020 12:30 PM Medical Record Number: [id number] Patient Account Number: [id number] Date of Birth/Sex: Treating RN: 1941/01/26 (80 y.o. [name], [place] Primary Care Araya Roel: [name] Other Clinician: Referring Finnlee Guarnieri: Treating Javier Mamone/Extender: Dreama Saa in Treatment: 2 Wound Status Wound Number: 4 Primary Lymphedema Etiology: Wound Location: Left, Dorsal Foot Wound Open Wounding Event: Blister Status: Date Acquired: 11/20/2019 Comorbid Anemia, Chronic Obstructive Pulmonary Disease (COPD), Weeks Of Treatment: 2 History: Arrhythmia, Congestive Heart Failure, Coronary Artery Disease, Clustered Wound: No Hypertension, Cirrhosis , Osteoarthritis Photos Wound Measurements Length: (cm) 1.3 Width: (cm) 3 Depth: (cm) 0.1 Area: (cm) 3.063 Volume: (cm) 0.306 % Reduction in Area: -114.3% % Reduction in Volume: -114% Epithelialization: Small (1-33%) Tunneling: No Undermining: No Wound Description Classification: Full Thickness Without Exposed Support Structu Wound Margin: Flat and Intact Exudate Amount: Medium Exudate Type: Purulent Exudate Color: yellow, brown, green Wound Bed Granulation Amount: Small (1-33%) Granulation Quality: Pink Necrotic Amount: Large (67-100%) Necrotic Quality: Adherent Slough res [employer] Odor After Cleansing: No Slough/Fibrino Yes Exposed Structure Fascia Exposed: No Fat Layer (Subcutaneous Tissue) Exposed: Yes Tendon Exposed: No Muscle Exposed: No Joint Exposed: No Bone Exposed: No Treatment  Notes Wound #4 (Foot) Wound Laterality: Dorsal, Left Cleanser Wound Cleanser Discharge Instruction: Cleanse the wound with wound cleanser prior to applying a clean dressing using gauze sponges, not tissue or cotton balls. Soap and Water Discharge Instruction: May shower and wash wound with dial antibacterial soap and water prior to dressing change. Peri-Wound Care Topical Primary Dressing KerraCel Ag Gelling Fiber Dressing, 4x5 in (silver alginate) Discharge Instruction: Apply silver alginate to wound bed as instructed Secondary Dressing ABD Pad, 5x9 Discharge Instruction: Apply over primary dressing as directed. Secured With Compression Wrap ThreePress (3 layer compression wrap) Discharge Instruction: Apply LIGHTY three layer compression as directed. Compression Stockings Add-Ons Electronic Signature(s) Signed: 06/27/2020 4:22:28 PM By: Sandre Kitty Signed: 06/27/2020 5:36:09 PM By: [name] RN Previous Signature: 06/26/2020 6:01:57 PM Version By: Baruch Gouty RN, BSN Entered By: Sandre Kitty on 06/27/2020 11:23:59 -------------------------------------------------------------------------------- Wound Assessment Details Patient Name: Date of Service: [name], Brian RLES E. 06/26/2020 12:30 PM Medical Record Number: [id number] Patient Account Number: [id number] Date of Birth/Sex:  Treating RN: 08-Aug-1940 (80 y.o. 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 Etiology: Date Acquired: 11/20/2019 Wound Open Weeks Of Treatment: 2 Status: Clustered Wound: No Comorbid Anemia, Chronic Obstructive Pulmonary Disease (COPD), History: Arrhythmia, Congestive Heart Failure,  Coronary Artery Disease, Hypertension, Cirrhosis , Osteoarthritis Photos Wound Measurements Length: (cm) 2.5 Width: (cm) 2.9 Depth: (cm) 0.1 Area: (cm) 5.694 Volume: (cm) 0.569 % Reduction in Area: 43.7% % Reduction in Volume: 43.7% Epithelialization: Small (1-33%) Tunneling: No Undermining: No Wound Description Classification: Full Thickness Without Exposed Support Structures Wound Margin: Flat and Intact Exudate Amount: Small Exudate Type: Purulent Exudate Color: yellow, brown, green Foul Odor After Cleansing: No Slough/Fibrino Yes Wound Bed Granulation Amount: Small (1-33%) Exposed Structure Granulation Quality: Pink Fascia Exposed: No Necrotic Amount: Large (67-100%) Fat Layer (Subcutaneous Tissue) Exposed: Yes Necrotic Quality: Adherent Slough Tendon Exposed: No Muscle Exposed: No Joint Exposed: No Bone Exposed: No Treatment Notes Wound #6 (Lower Leg) Wound Laterality: Left, Posterior Cleanser Wound Cleanser Discharge Instruction: Cleanse the wound with wound cleanser prior to applying a clean dressing using gauze sponges, not tissue or cotton balls. Soap and Water Discharge Instruction: May shower and wash wound with dial antibacterial soap and water prior to dressing change. Peri-Wound Care Topical Primary Dressing KerraCel Ag Gelling Fiber Dressing, 4x5 in (silver alginate) Discharge Instruction: Apply silver alginate to wound bed as instructed Secondary Dressing ABD Pad, 5x9 Discharge Instruction: Apply over primary dressing as directed. Secured With Compression Wrap ThreePress (3 layer compression wrap) Discharge Instruction: Apply LIGHTY three layer compression as directed. Compression Stockings Add-Ons Electronic Signature(s) Signed: 06/27/2020 4:22:28 PM By: Sandre Kitty Signed: 06/27/2020 5:36:09 PM By: [name] RN Previous Signature: 06/26/2020 6:01:57 PM Version By: Baruch Gouty RN, BSN Entered By: Sandre Kitty on 06/27/2020  11:30:54 -------------------------------------------------------------------------------- Wound Assessment Details Patient Name: Date of Service: [name], Brian RLES E. 06/26/2020 12:30 PM Medical Record Number: [id number] Patient Account Number: [id number] Date of Birth/Sex: Treating RN: 05-20-1940 (80 y.o. [name], [name] Primary Care Trinity Haun: [name] Other Clinician: Referring Ronrico Dupin: Treating Othello Dickenson/Extender: Dreama Saa in Treatment: 2 Wound Status Wound Number: 7 Primary Etiology: Lymphedema Wound Location: Left, Proximal, Anterior Lower Leg Wound Status: Healed - Epithelialized Wounding Event: Trauma Date Acquired: 06/04/2020 Weeks Of Treatment: 2 Clustered Wound: No Wound Measurements Length: (cm) Width: (cm) Depth: (cm) Area: (cm) Volume: (cm) 0 % Reduction in Area: 100% 0 % Reduction in Volume: 100% 0 0 0 Wound Description Classification: Full Thickness Without Exposed Support Structur es Treatment Notes Wound #7 (Lower Leg) Wound Laterality: Left, Anterior, Proximal Cleanser Peri-Wound Care Topical Primary Dressing Secondary Dressing Secured With Compression Wrap Compression Stockings Add-Ons Electronic Signature(s) Signed: 06/27/2020 5:36:09 PM By: [name] RN Entered By: [name] on 06/26/2020 13:16:42 -------------------------------------------------------------------------------- Wound Assessment Details Patient Name: Date of Service: [name], Brian RLES E. 06/26/2020 12:30 PM Medical Record Number: [id number] Patient Account Number: [id number] Date of Birth/Sex: Treating RN: 1941/04/21 (80 y.o. [name], [name] Primary Care Keundra Petrucelli: [name] Other Clinician: Referring Maira Christon: Treating Wanetta Funderburke/Extender: Dreama Saa in Treatment: 2 Wound Status Wound Number: 8 Primary Etiology: Pressure Ulcer Wound Location: Left, Posterior  Upper Leg Wound Status: Healed - Epithelialized Wounding Event: Pressure Injury Date Acquired: 05/23/2019 Weeks Of Treatment: 2 Clustered Wound: No  Wound Measurements Length: (cm) 0 Width: (cm) 0 Depth: (cm) 0 Area: (cm) 0 Volume: (cm) 0 % Reduction in Area: 100% % Reduction in Volume: 100% Wound Description Classification: Category/Stage II Treatment Notes Wound #8 (Upper Leg) Wound Laterality: Left, Posterior Cleanser Peri-Wound Care Topical Primary Dressing Secondary Dressing Secured With Compression Wrap Compression Stockings Add-Ons Electronic Signature(s) Signed: 06/27/2020 5:36:09 PM By: [name] RN Entered By: [name] on 06/26/2020 13:20:39 -------------------------------------------------------------------------------- Wound Assessment Details Patient Name: Date of Service: [name], Brian RLES E. 06/26/2020 12:30 PM Medical Record Number: [id number] Patient Account Number: [id number] Date of Birth/Sex: Treating RN: 12-26-1940 (80 y.o. [name], [name] Primary Care Jeb Schloemer: [name] Other Clinician: Referring Vaunda Gutterman: Treating Brynnly Bonet/Extender: Dreama Saa in Treatment: 2 Wound Status Wound Number: 9 Primary Pressure Ulcer Etiology: Wound Location: Left, Proximal, Posterior Upper Leg Wound Open Wounding Event: Pressure Injury Status: Date Acquired: 05/23/2019 Comorbid Anemia, Chronic Obstructive Pulmonary Disease (COPD), Weeks Of Treatment: 2 History: Arrhythmia, Congestive Heart Failure, Coronary Artery Disease, Clustered Wound: No Hypertension, Cirrhosis , Osteoarthritis Photos Wound Measurements Length: (cm) 2.2 Width: (cm) 1.6 Depth: (cm) 0.1 Area: (cm) 2.765 Volume: (cm) 0.276 % Reduction in Area: -780.6% % Reduction in Volume: -790.3% Epithelialization: Small (1-33%) Tunneling: No Undermining: No Wound Description Classification: Category/Stage II Wound Margin: Flat and  Intact Exudate Amount: Small Exudate Type: Serosanguineous Exudate Color: red, brown Foul Odor After Cleansing: No Slough/Fibrino No Wound Bed Granulation Amount: Large (67-100%) Exposed Structure Granulation Quality: Pink Fascia Exposed: No Necrotic Amount: Small (1-33%) Fat Layer (Subcutaneous Tissue) Exposed: Yes Necrotic Quality: Adherent Slough Tendon Exposed: No Muscle Exposed: No Joint Exposed: No Bone Exposed: No Treatment Notes Wound #9 (Upper Leg) Wound Laterality: Left, Posterior, Proximal Cleanser Wound Cleanser Discharge Instruction: Cleanse the wound with wound cleanser prior to applying a clean dressing using gauze sponges, not tissue or cotton balls. Soap and Water Discharge Instruction: May shower and wash wound with dial antibacterial soap and water prior to dressing change. Peri-Wound Care Topical Primary Dressing KerraCel Ag Gelling Fiber Dressing, 4x5 in (silver alginate) Discharge Instruction: Apply silver alginate to wound bed as instructed Secondary Dressing ABD Pad, 5x9 Discharge Instruction: Apply over primary dressing as directed. Secured With Compression Wrap ThreePress (3 layer compression wrap) Discharge Instruction: Apply LIGHTY three layer compression as directed. Compression Stockings Add-Ons Electronic Signature(s) Signed: 06/27/2020 4:22:28 PM By: Sandre Kitty Signed: 06/27/2020 5:36:09 PM By: [name] RN Previous Signature: 06/26/2020 6:01:57 PM Version By: Baruch Gouty RN, BSN Entered By: Sandre Kitty on 06/27/2020 11:43:10 -------------------------------------------------------------------------------- Vitals Details Patient Name: Date of Service: [name], Brian RLES E. 06/26/2020 12:30 PM Medical Record Number: [id number] Patient Account Number: [id number] Date of Birth/Sex: Treating RN: February 13, 1941 (80 y.o. [name], [name] Primary Care Kameisha Malicki: [name] Other Clinician: Referring Shayden Gingrich: Treating  Rachyl Wuebker/Extender: Dreama Saa in Treatment: 2 Vital Signs Time Taken: 13:00 Temperature (F): 98.7 Height (in): 71 Pulse (bpm): 74 Weight (lbs): 178 Respiratory Rate (breaths/min): 17 Body Mass Index (BMI): 24.8 Blood Pressure (mmHg): 157/74 Reference Range: 80 - 120 mg / dl Electronic Signature(s) Signed: 06/27/2020 5:36:09 PM By: [name] RN Entered By: [name] on 06/26/2020 13:00:32

## 2020-06-29 NOTE — Progress Notes (Signed)
[place], [name] ([id number]) Visit Report for 06/29/2020 Arrival Information Details Patient Name: Date of Service: [name], Brian RLES E. 06/29/2020 11:30 A M Medical Record Number: [id number] Patient Account Number: [id number] Date of Birth/Sex: Treating RN: 1940-10-23 (80 y.o. [name], [name] Primary Care Kieth Hartis: [name] Other Clinician: Referring Jere Bostrom: Treating Kris Burd/Extender: Dreama Saa in Treatment: 2 Visit Information History Since Last Visit Added or deleted any medications: No Patient Arrived: Wheel Chair Any new allergies or adverse reactions: No Arrival Time: 11:32 Had a fall or experienced change in No Accompanied By: self activities of daily living that may affect Transfer Assistance: Harrel Lemon Lift risk of falls: Patient Identification Verified: Yes Signs or symptoms of abuse/neglect since last visito No Secondary Verification Process Completed: Yes Hospitalized since last visit: No Patient Requires Transmission-Based Precautions: No Implantable device outside of the clinic excluding No Patient Has Alerts: No cellular tissue based products placed in the center since last visit: Has Dressing in Place as Prescribed: Yes Pain Present Now: No Electronic Signature(s) Signed: 06/29/2020 5:22:03 PM By: [name] RN Entered By: [name] on 06/29/2020 12:05:34 -------------------------------------------------------------------------------- Compression Therapy Details Patient Name: Date of Service: [name], Brian RLES E. 06/29/2020 11:30 A M Medical Record Number: [id number] Patient Account Number: [id number] Date of Birth/Sex: Treating RN: [birth year] (80 y.o. [name] Primary Care Tabatha Razzano: [name] Other Clinician: Referring Cattleya Dobratz: Treating Julyana Woolverton/Extender: Dreama Saa in Treatment: 2 Compression Therapy Performed for Wound Assessment: Wound #1  Right,Medial Malleolus Performed By: Clinician [name], RN Compression Type: Three Layer Electronic Signature(s) Signed: 06/29/2020 5:22:03 PM By: [name] RN Entered By: [name] on 06/29/2020 12:03:40 -------------------------------------------------------------------------------- Compression Therapy Details Patient Name: Date of Service: [name], Brian RLES E. 06/29/2020 11:30 A M Medical Record Number: [id number] Patient Account Number: [id number] Date of Birth/Sex: Treating RN: 11-Oct-1940 (80 y.o. [name] Primary Care Meloni Hinz: Other Clinician: [name] Referring Javen Ridings: Treating Mason Dibiasio/Extender: Dreama Saa in Treatment: 2 Compression Therapy Performed for Wound Assessment: Wound #10 Left,Lateral Foot Performed By: Clinician [name], RN Compression Type: Three Layer Electronic Signature(s) Signed: 06/29/2020 5:22:03 PM By: [name] RN Entered By: [name] on 06/29/2020 12:03:41 -------------------------------------------------------------------------------- Compression Therapy Details Patient Name: Date of Service: [name], Brian RLES E. 06/29/2020 11:30 A M Medical Record Number: [id number] Patient Account Number: [id number] Date of Birth/Sex: Treating RN: August 06, 1940 (80 y.o. [name] Primary Care Talha Iser: [name] Other Clinician: Referring Woody Kronberg: Treating Hiren Peplinski/Extender: Dreama Saa in Treatment: 2 Compression Therapy Performed for Wound Assessment: Wound #4 Left,Dorsal Foot Performed By: Clinician [name], RN Compression Type: Three Layer Electronic Signature(s) Signed: 06/29/2020 5:22:03 PM By: [name] RN Entered By: [name] on 06/29/2020 12:03:41 -------------------------------------------------------------------------------- Compression Therapy Details Patient Name: Date of  Service: [name], Brian RLES E. 06/29/2020 11:30 A M Medical Record Number: [id number] Patient Account Number: [id number] Date of Birth/Sex: Treating RN: 03/27/1941 (80 y.o. [name] Primary Care Faigy Stretch: [name] Other Clinician: Referring Cannon Quinton: Treating Korinna Tat/Extender: Dreama Saa in Treatment: 2 Compression Therapy Performed for Wound Assessment: Wound #2 Right,Posterior Lower Leg Performed By: Clinician [name], RN Compression Type: Three Layer Electronic Signature(s) Signed: 06/29/2020 5:22:03 PM By: [name] RN Entered By: [name] on 06/29/2020 12:03:41 -------------------------------------------------------------------------------- Compression Therapy Details Patient Name: Date of Service: [name], Brian RLES E. 06/29/2020 11:30 A M Medical Record Number: [id number] Patient Account Number: [id number] Date  of Birth/Sex: Treating RN: Jan 10, 1941 (80 y.o. [name] Primary Care Ashle Stief: [name] Other Clinician: Referring Randa Riss: Treating Inocente Krach/Extender: Dreama Saa in Treatment: 2 Compression Therapy Performed for Wound Assessment: Wound #6 Left,Posterior Lower Leg Performed By: Clinician [name], RN Compression Type: Three Layer Electronic Signature(s) Signed: 06/29/2020 5:22:03 PM By: [name] RN Entered By: [name] on 06/29/2020 12:03:41 -------------------------------------------------------------------------------- Encounter Discharge Information Details Patient Name: Date of Service: [name], Brian RLES E. 06/29/2020 11:30 A M Medical Record Number: [id number] Patient Account Number: [id number] Date of Birth/Sex: Treating RN: 05/25/1940 (80 y.o. [name], [name] Primary Care Lonzo Saulter: [name] Other Clinician: Referring Farin Buhman: Treating Khamari Sheehan/Extender: Dreama Saa in Treatment: 2 Encounter Discharge Information Items Discharge Condition: Stable Ambulatory Status: Wheelchair Discharge Destination: Home Transportation: Private Auto Accompanied By: self Schedule Follow-up Appointment: Yes Clinical Summary of Care: Electronic Signature(s) Signed: 06/29/2020 5:22:03 PM By: [name] RN Entered By: [name] on 06/29/2020 12:05:19 -------------------------------------------------------------------------------- Patient/Caregiver Education Details Patient Name: Date of Service: [name], [place] 3/11/2022andnbsp11:30 [place] Record Number: [id number] Patient Account Number: [id number] Date of Birth/Gender: Treating RN: [birth year] (80 y.o. [name] Primary Care Physician: [name] Other Clinician: Referring Physician: Treating Physician/Extender: Dreama Saa in Treatment: 2 Education Assessment Education Provided To: Patient Education Topics Provided Wound/Skin Impairment: Handouts: Skin Care Do's and Dont's Methods: Explain/Verbal Responses: Reinforcements needed Electronic Signature(s) Signed: 06/29/2020 5:22:03 PM By: [name] RN Entered By: [name] on 06/29/2020 12:05:08 -------------------------------------------------------------------------------- Wound Assessment Details Patient Name: Date of Service: [name], Brian RLES E. 06/29/2020 11:30 A M Medical Record Number: [id number] Patient Account Number: [id number] Date of Birth/Sex: Treating RN: Mar 03, 1941 (80 y.o. [name], [name] Primary Care Garv Kuechle: [name] Other Clinician: Referring Tanesia Butner: Treating Haisley Arens/Extender: Dreama Saa in Treatment: 2 Wound Status Wound Number: 1 Primary Etiology: Venous Leg Ulcer Wound Location: Right, Medial Malleolus Secondary Etiology: Lymphedema Wounding Event: Gradually  Appeared Wound Status: Open Date Acquired: 11/20/2019 Weeks Of Treatment: 2 Clustered Wound: No Wound Measurements Length: (cm) 2.2 Width: (cm) 1.5 Depth: (cm) 0.1 Area: (cm) 2.592 Volume: (cm) 0.259 % Reduction in Area: 78.3% % Reduction in Volume: 78.3% Wound Description Classification: Full Thickness Without Exposed Support Structur es Treatment Notes Wound #1 (Malleolus) Wound Laterality: Right, Medial Cleanser Wound Cleanser Discharge Instruction: Cleanse the wound with wound cleanser prior to applying a clean dressing using gauze sponges, not tissue or cotton balls. Soap and Water Discharge Instruction: May shower and wash wound with dial antibacterial soap and water prior to dressing change. Peri-Wound Care Topical Primary Dressing KerraCel Ag Gelling Fiber Dressing, 4x5 in (silver alginate) Discharge Instruction: Apply silver alginate to wound bed as instructed Secondary Dressing ABD Pad, 5x9 Discharge Instruction: Apply over primary dressing as directed. Secured With Compression Wrap ThreePress (3 layer compression wrap) Discharge Instruction: Apply LIGHTY three layer compression as directed. Compression Stockings Add-Ons Electronic Signature(s) Signed: 06/29/2020 5:22:03 PM By: [name] RN Entered By: [name] on 06/29/2020 12:03:02 -------------------------------------------------------------------------------- Wound Assessment Details Patient Name: Date of Service: [name], Brian RLES E. 06/29/2020 11:30 A M Medical Record Number: [id number] Patient Account Number: [id number] Date of Birth/Sex: Treating RN: January 20, 1941 (80 y.o. [name] Primary Care Edwards Mckelvie: [name] Other Clinician: Referring Beckhem Isadore: Treating Arihant Pennings/Extender: Dreama Saa in Treatment: 2 Wound Status Wound Number: 10 Primary Etiology: Lymphedema Wound Location: Left, Lateral Foot Wound Status: Open Wounding  Event: Gradually Appeared Date Acquired: 06/26/2020 Weeks Of Treatment: 0 Clustered Wound: No Wound Measurements Length: (cm) 0.5 Width: (cm) 0.4 Depth: (cm) 0.1 Area: (cm) 0.157 Volume: (cm) 0.016 % Reduction in Area: 0% % Reduction in Volume: 0% Wound Description Classification: Full Thickness Without Exposed Support Structur es Treatment Notes Wound #10 (Foot) Wound Laterality: Left, Lateral Cleanser Wound Cleanser Discharge Instruction: Cleanse the wound with wound cleanser prior to applying a clean dressing using gauze sponges, not tissue or cotton balls. Soap and Water Discharge Instruction: May shower and wash wound with dial antibacterial soap and water prior to dressing change. Peri-Wound Care Topical Primary Dressing KerraCel Ag Gelling Fiber Dressing, 4x5 in (silver alginate) Discharge Instruction: Apply silver alginate to wound bed as instructed Secondary Dressing ABD Pad, 5x9 Discharge Instruction: Apply over primary dressing as directed. Secured With Compression Wrap ThreePress (3 layer compression wrap) Discharge Instruction: Apply LIGHTY three layer compression as directed. Compression Stockings Add-Ons Electronic Signature(s) Signed: 06/29/2020 5:22:03 PM By: [name] RN Entered By: [name] on 06/29/2020 12:03:02 -------------------------------------------------------------------------------- Wound Assessment Details Patient Name: Date of Service: [name], Brian RLES E. 06/29/2020 11:30 A M Medical Record Number: [id number] Patient Account Number: [id number] Date of Birth/Sex: Treating RN: Jun 24, 1940 (80 y.o. [name], [name] Primary Care Solash Tullo: [name] Other Clinician: Referring Hiawatha Merriott: Treating Hodge Stachnik/Extender: Dreama Saa in Treatment: 2 Wound Status Wound Number: 11 Primary Etiology: Lymphedema Wound Location: Left T Great oe Wound Status: Open Wounding Event: Gradually  Appeared Date Acquired: 06/26/2020 Weeks Of Treatment: 0 Clustered Wound: No Wound Measurements Length: (cm) 0.5 Width: (cm) 0.2 Depth: (cm) 0.1 Area: (cm) 0.079 Volume: (cm) 0.008 % Reduction in Area: 0% % Reduction in Volume: 0% Wound Description Classification: Full Thickness Without Exposed Support Structur es Treatment Notes Wound #11 (Toe Great) Wound Laterality: Left Cleanser Wound Cleanser Discharge Instruction: Cleanse the wound with wound cleanser prior to applying a clean dressing using gauze sponges, not tissue or cotton balls. Soap and Water Discharge Instruction: May shower and wash wound with dial antibacterial soap and water prior to dressing change. Peri-Wound Care Topical Primary Dressing KerraCel Ag Gelling Fiber Dressing, 4x5 in (silver alginate) Discharge Instruction: Apply silver alginate to wound bed as instructed Secondary Dressing ABD Pad, 5x9 Discharge Instruction: Apply over primary dressing as directed. Secured With Compression Wrap ThreePress (3 layer compression wrap) Discharge Instruction: Apply LIGHTY three layer compression as directed. Compression Stockings Add-Ons Electronic Signature(s) Signed: 06/29/2020 5:22:03 PM By: [name] RN Entered By: [name] on 06/29/2020 12:03:02 -------------------------------------------------------------------------------- Wound Assessment Details Patient Name: Date of Service: [name], Brian RLES E. 06/29/2020 11:30 A M Medical Record Number: [id number] Patient Account Number: [id number] Date of Birth/Sex: Treating RN: 06-27-1940 (80 y.o. [name], [name] Primary Care Nathaniel Wakeley: [name] Other Clinician: Referring Brook Geraci: Treating Janelis Stelzer/Extender: Dreama Saa in Treatment: 2 Wound Status Wound Number: 2 Primary Etiology: Venous Leg Ulcer Wound Location: Right, Posterior Lower Leg Secondary Etiology: Lymphedema Wounding Event:  Gradually Appeared Wound Status: Open Date Acquired: 11/20/2019 Weeks Of Treatment: 2 Clustered Wound: No Wound Measurements Length: (cm) 1.8 Width: (cm) 1.8 Depth: (cm) 0.1 Area: (cm) 2.545 Volume: (cm) 0.254 % Reduction in Area: 4.7% % Reduction in Volume: 4.9% Wound Description Classification: Full Thickness Without Exposed Support Structur es Treatment Notes Wound #2 (Lower Leg) Wound Laterality: Right, Posterior Cleanser Wound Cleanser Discharge Instruction: Cleanse the wound with wound cleanser prior to applying a clean dressing using gauze sponges, not tissue or cotton balls.  Soap and Water Discharge Instruction: May shower and wash wound with dial antibacterial soap and water prior to dressing change. Peri-Wound Care Topical Primary Dressing KerraCel Ag Gelling Fiber Dressing, 4x5 in (silver alginate) Discharge Instruction: Apply silver alginate to wound bed as instructed Secondary Dressing ABD Pad, 5x9 Discharge Instruction: Apply over primary dressing as directed. Secured With Compression Wrap ThreePress (3 layer compression wrap) Discharge Instruction: Apply LIGHTY three layer compression as directed. Compression Stockings Add-Ons Electronic Signature(s) Signed: 06/29/2020 5:22:03 PM By: [name] RN Entered By: [name] on 06/29/2020 12:03:03 -------------------------------------------------------------------------------- Wound Assessment Details Patient Name: Date of Service: [name], Brian RLES E. 06/29/2020 11:30 A M Medical Record Number: [id number] Patient Account Number: [id number] Date of Birth/Sex: Treating RN: 1940-11-26 (80 y.o. [name], [name] Primary Care Zelene Barga: [name] Other Clinician: Referring Lavin Petteway: Treating Jettson Crable/Extender: Dreama Saa in Treatment: 2 Wound Status Wound Number: 4 Primary Etiology: Lymphedema Wound Location: Left, Dorsal Foot Wound Status:  Open Wounding Event: Blister Date Acquired: 11/20/2019 Weeks Of Treatment: 2 Clustered Wound: No Wound Measurements Length: (cm) 1.3 Width: (cm) 3 Depth: (cm) 0.1 Area: (cm) 3.063 Volume: (cm) 0.306 % Reduction in Area: -114.3% % Reduction in Volume: -114% Wound Description Classification: Full Thickness Without Exposed Support Structur es Treatment Notes Wound #4 (Foot) Wound Laterality: Dorsal, Left Cleanser Wound Cleanser Discharge Instruction: Cleanse the wound with wound cleanser prior to applying a clean dressing using gauze sponges, not tissue or cotton balls. Soap and Water Discharge Instruction: May shower and wash wound with dial antibacterial soap and water prior to dressing change. Peri-Wound Care Topical Primary Dressing KerraCel Ag Gelling Fiber Dressing, 4x5 in (silver alginate) Discharge Instruction: Apply silver alginate to wound bed as instructed Secondary Dressing ABD Pad, 5x9 Discharge Instruction: Apply over primary dressing as directed. Secured With Compression Wrap ThreePress (3 layer compression wrap) Discharge Instruction: Apply LIGHTY three layer compression as directed. Compression Stockings Add-Ons Electronic Signature(s) Signed: 06/29/2020 5:22:03 PM By: [name] RN Entered By: [name] on 06/29/2020 12:03:03 -------------------------------------------------------------------------------- Wound Assessment Details Patient Name: Date of Service: [name], Brian RLES E. 06/29/2020 11:30 A M Medical Record Number: [id number] Patient Account Number: [id number] Date of Birth/Sex: Treating RN: 1940-07-01 (80 y.o. [name], [name] Primary Care Terriah Reggio: [name] Other Clinician: Referring Rashmi Tallent: Treating Yanelie Abraha/Extender: Dreama Saa in Treatment: 2 Wound Status Wound Number: 6 Primary Etiology: Lymphedema Wound Location: Left, Posterior Lower Leg Secondary Etiology: Venous Leg  Ulcer Wounding Event: Gradually Appeared Wound Status: Open Date Acquired: 11/20/2019 Weeks Of Treatment: 2 Clustered Wound: No Wound Measurements Length: (cm) 2.5 Width: (cm) 2.9 Depth: (cm) 0.1 Area: (cm) 5.694 Volume: (cm) 0.569 % Reduction in Area: 43.7% % Reduction in Volume: 43.7% Wound Description Classification: Full Thickness Without Exposed Support Structur es Treatment Notes Wound #6 (Lower Leg) Wound Laterality: Left, Posterior Cleanser Wound Cleanser Discharge Instruction: Cleanse the wound with wound cleanser prior to applying a clean dressing using gauze sponges, not tissue or cotton balls. Soap and Water Discharge Instruction: May shower and wash wound with dial antibacterial soap and water prior to dressing change. Peri-Wound Care Topical Primary Dressing KerraCel Ag Gelling Fiber Dressing, 4x5 in (silver alginate) Discharge Instruction: Apply silver alginate to wound bed as instructed Secondary Dressing ABD Pad, 5x9 Discharge Instruction: Apply over primary dressing as directed. Secured With Compression Wrap ThreePress (3 layer compression wrap) Discharge Instruction: Apply LIGHTY three layer compression as directed. Compression Stockings Add-Ons Electronic Signature(s) Signed: 06/29/2020 5:22:03 PM By: Hollie Salk,  [name] RN Entered By: [name] on 06/29/2020 12:03:03 -------------------------------------------------------------------------------- Wound Assessment Details Patient Name: Date of Service: [name], Brian RLES E. 06/29/2020 11:30 A M Medical Record Number: [id number] Patient Account Number: [id number] Date of Birth/Sex: Treating RN: October 21, 1940 (80 y.o. [name], [name] Primary Care Lillias Difrancesco: [name] Other Clinician: Referring Mahkayla Preece: Treating Takara Sermons/Extender: Dreama Saa in Treatment: 2 Wound Status Wound Number: 9 Primary Etiology: Pressure Ulcer Wound Location: Left,  Proximal, Posterior Upper Leg Wound Status: Open Wounding Event: Pressure Injury Date Acquired: 05/23/2019 Weeks Of Treatment: 2 Clustered Wound: No Wound Measurements Length: (cm) 2.2 Width: (cm) 1.6 Depth: (cm) 0.1 Area: (cm) 2.765 Volume: (cm) 0.276 % Reduction in Area: -780.6% % Reduction in Volume: -790.3% Wound Description Classification: Category/Stage II Treatment Notes Wound #9 (Upper Leg) Wound Laterality: Left, Posterior, Proximal Cleanser Peri-Wound Care Topical Primary Dressing Secondary Dressing Secured With Compression Wrap Compression Stockings Add-Ons Electronic Signature(s) Signed: 06/29/2020 5:22:03 PM By: [name] RN Entered By: [name] on 06/29/2020 12:03:03 -------------------------------------------------------------------------------- Vitals Details Patient Name: Date of Service: [name], Brian RLES E. 06/29/2020 11:30 A M Medical Record Number: [id number] Patient Account Number: [id number] Date of Birth/Sex: Treating RN: 1941/01/29 (80 y.o. [name], [name] Primary Care Brandilyn Nanninga: [name] Other Clinician: Referring Miliano Cotten: Treating Aliyyah Riese/Extender: Dreama Saa in Treatment: 2 Vital Signs Time Taken: 01:32 Temperature (F): 98 Height (in): 71 Pulse (bpm): 82 Weight (lbs): 178 Respiratory Rate (breaths/min): 17 Body Mass Index (BMI): 24.8 Blood Pressure (mmHg): 130/68 Reference Range: 80 - 120 mg / dl Electronic Signature(s) Signed: 06/29/2020 5:22:03 PM By: [name] RN Entered By: [name] on 06/29/2020 11:34:05

## 2020-07-02 ENCOUNTER — Ambulatory Visit: Payer: Medicare Other | Admitting: Internal Medicine

## 2020-07-03 ENCOUNTER — Encounter (HOSPITAL_BASED_OUTPATIENT_CLINIC_OR_DEPARTMENT_OTHER): Payer: Medicare Other | Admitting: Internal Medicine

## 2020-07-04 ENCOUNTER — Telehealth: Payer: Self-pay | Admitting: Physician Assistant

## 2020-07-04 NOTE — Telephone Encounter (Signed)
Received anew pt referral from [facility] for anemia. [name] has been cld and scheduled to see [name] on 3/22 at 1pm. Pt aware to arrive 20 minutes early.

## 2020-07-05 ENCOUNTER — Other Ambulatory Visit: Payer: Self-pay

## 2020-07-05 ENCOUNTER — Encounter (HOSPITAL_BASED_OUTPATIENT_CLINIC_OR_DEPARTMENT_OTHER): Payer: Medicare Other | Admitting: Internal Medicine

## 2020-07-05 DIAGNOSIS — I890 Lymphedema, not elsewhere classified: Secondary | ICD-10-CM | POA: Diagnosis not present

## 2020-07-05 DIAGNOSIS — I509 Heart failure, unspecified: Secondary | ICD-10-CM | POA: Diagnosis not present

## 2020-07-05 DIAGNOSIS — L97312 Non-pressure chronic ulcer of right ankle with fat layer exposed: Secondary | ICD-10-CM | POA: Diagnosis not present

## 2020-07-05 DIAGNOSIS — L97818 Non-pressure chronic ulcer of other part of right lower leg with other specified severity: Secondary | ICD-10-CM | POA: Diagnosis not present

## 2020-07-05 DIAGNOSIS — L97522 Non-pressure chronic ulcer of other part of left foot with fat layer exposed: Secondary | ICD-10-CM | POA: Diagnosis not present

## 2020-07-05 DIAGNOSIS — L97528 Non-pressure chronic ulcer of other part of left foot with other specified severity: Secondary | ICD-10-CM | POA: Diagnosis not present

## 2020-07-05 DIAGNOSIS — L97822 Non-pressure chronic ulcer of other part of left lower leg with fat layer exposed: Secondary | ICD-10-CM | POA: Diagnosis not present

## 2020-07-05 DIAGNOSIS — I110 Hypertensive heart disease with heart failure: Secondary | ICD-10-CM | POA: Diagnosis not present

## 2020-07-05 DIAGNOSIS — I87333 Chronic venous hypertension (idiopathic) with ulcer and inflammation of bilateral lower extremity: Secondary | ICD-10-CM | POA: Diagnosis not present

## 2020-07-05 DIAGNOSIS — L97228 Non-pressure chronic ulcer of left calf with other specified severity: Secondary | ICD-10-CM | POA: Diagnosis not present

## 2020-07-05 NOTE — Progress Notes (Signed)
Mcmackin, KASTIEL SIMONIAN ([id number]) Visit Report for 07/05/2020 Arrival Information Details Patient Name: Date of Service: [name], [name] 07/05/2020 9:00 A M Medical Record Number: [id number] Patient Account Number: [id number] Date of Birth/Sex: Treating RN: Jan 17, 1941 (80 y.o. [name] Primary Care Dai Mcadams: [name] Other Clinician: Referring Marvens Hollars: Treating Tolulope Pinkett/Extender: [name] in Treatment: 3 Visit Information History Since Last Visit Added or deleted any medications: No Patient Arrived: Wheel Chair Any new allergies or adverse reactions: No Arrival Time: 09:03 Had a fall or experienced change in No Accompanied By: self activities of daily living that may affect Transfer Assistance: None risk of falls: Patient Identification Verified: Yes Signs or symptoms of abuse/neglect since last visito No Secondary Verification Process Completed: Yes Hospitalized since last visit: No Patient Requires Transmission-Based Precautions: No Implantable device outside of the clinic excluding No Patient Has Alerts: No cellular tissue based products placed in the center since last visit: Has Dressing in Place as Prescribed: Yes Pain Present Now: No Electronic Signature(s) Signed: 07/05/2020 12:58:14 PM By: Sandre Kitty Entered By: Sandre Kitty on 07/05/2020 09:04:12 -------------------------------------------------------------------------------- Compression Therapy Details Patient Name: Date of Service: [name], [name] 07/05/2020 9:00 [place] Record Number: [id number] Patient Account Number: [id number] Date of Birth/Sex: Treating RN: 1940-11-05 (80 y.o. [name] Primary Care Lakevia Perris: [name] Other Clinician: Referring Zahra Peffley: Treating Amaree Loisel/Extender: [name] in Treatment: 3 Compression Therapy Performed for Wound Assessment: Wound #1 Right,Medial  Malleolus Performed By: Clinician Baruch Gouty, RN Compression Type: Three Layer Post Procedure Diagnosis Same as Pre-procedure Electronic Signature(s) Signed: 07/05/2020 5:53:43 PM By: [name] Entered By: [name] on 07/05/2020 10:01:30 -------------------------------------------------------------------------------- Compression Therapy Details Patient Name: Date of Service: [name], [name] 07/05/2020 9:00 A M Medical Record Number: [id number] Patient Account Number: [id number] Date of Birth/Sex: Treating RN: 11-Jun-1940 (80 y.o. [name] Primary Care Rosina Cressler: [name] Other Clinician: Referring Kaneesha Constantino: Treating Davita Sublett/Extender: [name] in Treatment: 3 Compression Therapy Performed for Wound Assessment: Wound #10 Left,Lateral Foot Performed By: Clinician Baruch Gouty, RN Compression Type: Three Layer Post Procedure Diagnosis Same as Pre-procedure Electronic Signature(s) Signed: 07/05/2020 5:53:43 PM By: [name] Entered By: [name] on 07/05/2020 10:01:30 -------------------------------------------------------------------------------- Compression Therapy Details Patient Name: Date of Service: [name], [name] 07/05/2020 9:00 A M Medical Record Number: [id number] Patient Account Number: [id number] Date of Birth/Sex: Treating RN: 21-Jun-1940 (80 y.o. [name] Primary Care Maleigh Bagot: [name] Other Clinician: Referring Denissa Cozart: Treating Justin Buechner/Extender: [name] in Treatment: 3 Compression Therapy Performed for Wound Assessment: Wound #2 Right,Posterior Lower Leg Performed By: Clinician Baruch Gouty, RN Compression Type: Three Layer Post Procedure Diagnosis Same as Pre-procedure Electronic Signature(s) Signed: 07/05/2020 5:53:43 PM By: [name] Entered By: [name] on 07/05/2020  10:01:30 -------------------------------------------------------------------------------- Compression Therapy Details Patient Name: Date of Service: [name], [name] 07/05/2020 9:00 A M Medical Record Number: [id number] Patient Account Number: [id number] Date of Birth/Sex: Treating RN: [birth year] (80 y.o. [name] Primary Care Raechal Raben: [name] Other Clinician: Referring Yu Cragun: Treating Safi Culotta/Extender: [name] in Treatment: 3 Compression Therapy Performed for Wound Assessment: Wound #6 Left,Posterior Lower Leg Performed By: Clinician Baruch Gouty, RN Compression Type: Three Layer Post Procedure Diagnosis Same as Pre-procedure Electronic Signature(s) Signed: 07/05/2020 5:53:43 PM By: [name] Signed: 07/05/2020 5:53:43 PM By: [name] Entered By: [name] on 07/05/2020 10:01:30 -------------------------------------------------------------------------------- Compression Therapy Details Patient Name:  Date of Service: [name], [name] 07/05/2020 9:00 A M Medical Record Number: [id number] Patient Account Number: [id number] Date of Birth/Sex: Treating RN: 10-22-1940 (80 y.o. [name] Primary Care Shoshanah Dapper: [name] Other Clinician: Referring Dorthie Santini: Treating Leida Luton/Extender: [name] in Treatment: 3 Compression Therapy Performed for Wound Assessment: Wound #4 Left,Dorsal Foot Performed By: Clinician Baruch Gouty, RN Compression Type: Three Layer Post Procedure Diagnosis Same as Pre-procedure Electronic Signature(s) Signed: 07/05/2020 5:53:43 PM By: [name] Entered By: [name] on 07/05/2020 10:01:30 -------------------------------------------------------------------------------- Compression Therapy Details Patient Name: Date of Service: [name], [name] 07/05/2020 9:00 A M Medical Record Number: [id number] Patient Account  Number: [id number] Date of Birth/Sex: Treating RN: 1940/11/28 (80 y.o. [name] Primary Care Noha Karasik: [name] Other Clinician: Referring Illona Bulman: Treating Dosha Broshears/Extender: [name] in Treatment: 3 Compression Therapy Performed for Wound Assessment: Wound #9 Left,Proximal,Posterior Upper Leg Performed By: Clinician Baruch Gouty, RN Compression Type: Three Layer Post Procedure Diagnosis Same as Pre-procedure Electronic Signature(s) Signed: 07/05/2020 5:53:43 PM By: [name] Entered By: [name] on 07/05/2020 10:01:30 -------------------------------------------------------------------------------- Encounter Discharge Information Details Patient Name: Date of Service: [name], [name] 07/05/2020 9:00 [place] Record Number: [id number] Patient Account Number: [id number] Date of Birth/Sex: Treating RN: [birth year] (80 y.o. [name] Primary Care Skyleen Bentley: [name] Other Clinician: Referring Cordarrel Stiefel: Treating Ramello Cordial/Extender: [name] in Treatment: 3 Encounter Discharge Information Items Discharge Condition: Stable Ambulatory Status: Ambulatory Discharge Destination: Home Transportation: Private Auto Accompanied By: self Schedule Follow-up Appointment: Yes Clinical Summary of Care: Patient Declined Electronic Signature(s) Signed: 07/05/2020 5:44:19 PM By: Baruch Gouty RN, BSN Entered By: Baruch Gouty on 07/05/2020 11:11:23 -------------------------------------------------------------------------------- Lower Extremity Assessment Details Patient Name: Date of Service: [name], [name] 07/05/2020 9:00 A M Medical Record Number: [id number] Patient Account Number: [id number] Date of Birth/Sex: Treating RN: 1940/04/27 (80 y.o. [name] Primary Care Kunta Hilleary: [name] Other Clinician: Referring Archit Leger: Treating  Millette Halberstam/Extender: [name] in Treatment: 3 Edema Assessment Assessed: [name]: Yes] [name]: Yes] Edema: [Left: Yes] [Right: Yes] Calf Left: Right: Point of Measurement: From Medial Instep 30 cm 28 cm Ankle Left: Right: Point of Measurement: From Medial Instep 21.5 cm 22 cm Vascular Assessment Pulses: Dorsalis Pedis Palpable: [Left:No] [Right:No] Notes Pedal and ankle edema Electronic Signature(s) Signed: 07/05/2020 5:43:09 PM By: [name] Signed: 07/05/2020 5:53:43 PM By: [name] Entered By: [name] on 07/05/2020 09:34:29 -------------------------------------------------------------------------------- Multi Wound Chart Details Patient Name: Date of Service: [name], [name] 07/05/2020 9:00 A M Medical Record Number: [id number] Patient Account Number: [id number] Date of Birth/Sex: Treating RN: 1941-04-04 (80 y.o. [name], [name] Primary Care Talibah Colasurdo: [name] Other Clinician: Referring Shamika Pedregon: Treating [name]/Extender: [name] in Treatment: 3 Vital Signs Height(in): 50 Pulse(bpm): 49 Weight(lbs): 178 Blood Pressure(mmHg): 107/57 Body Mass Index(BMI): 25 Temperature(F): 97.6 Respiratory Rate(breaths/min): 84 Photos: [1:No Photos Right, Medial Malleolus] [10:No Photos Left, Lateral Foot] [11:No Photos Left T Great oe] Wound Location: [1:Gradually Appeared] [10:Gradually Appeared] [11:Gradually Appeared] Wounding Event: [1:Venous Leg Ulcer] [10:Lymphedema] [11:Lymphedema] Primary Etiology: [1:Lymphedema] [10:N/A] [11:N/A] 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, Chronic Obstructive] [10:Anemia, Chronic Obstructive] [11:Anemia, Chronic Obstructive] Comorbid History: [1:Pulmonary Disease (COPD), Arrhythmia, Congestive Heart Failure, Arrhythmia, Congestive Heart Failure, Arrhythmia, Congestive Heart Failure, Coronary Artery Disease, Hypertension, Cirrhosis , Osteoarthritis  Hypertension, Cirrhosis ,  Osteoarthritis Hypertension, Cirrhosis , Osteoarthritis 11/20/2019] [10:Pulmonary Disease (COPD), Coronary Artery Disease, 06/26/2020] [11:Pulmonary Disease (COPD), Coronary Artery  Disease, 06/26/2020] Date Acquired: [1:3] [10:1] [11:1] Weeks of Treatment: [1:Open] [10:Open] [11:Open] Wound Status: [1:0.9x1.4x0.1] [10:0.3x0.3x0.1] [11:0.6x0.5x0.2] Measurements L x W x D (cm) [1:0.99] [10:0.071] [11:0.236] A (cm) : rea [1:0.099] [10:0.007] [11:0.047] Volume (cm) : [1:91.70%] [10:54.80%] [11:-198.70%] % Reduction in Area: [1:91.70%] [10:56.30%] [11:-487.50%] % Reduction in Volume: [1:Full Thickness Without Exposed] [10:Full Thickness Without Exposed] [11:Full Thickness Without Exposed] Classification: [1:Support Structures Medium] [10:Support Structures Medium] [11:Support Structures Medium] Exudate Amount: [1:Serosanguineous] [10:Serosanguineous] [11:Serosanguineous] Exudate Type: [1:red, brown] [10:red, brown] [11:red, brown] Exudate Color: [1:Distinct, outline attached] [10:Distinct, outline attached] [11:Distinct, outline attached] Wound Margin: [1:Large (67-100%)] [10:Small (1-33%)] [11:Medium (34-66%)] Granulation Amount: [1:Pink] [10:Red] [11:Red, Pink] Granulation Quality: [1:None Present (0%)] [10:Large (67-100%)] [11:Small (1-33%)] Necrotic Amount: [1:Fat Layer (Subcutaneous Tissue): Yes Fat Layer (Subcutaneous Tissue): Yes Fat Layer (Subcutaneous Tissue): Yes] Exposed Structures: [1:Fascia: No Tendon: No Muscle: No Joint: No Bone: No Large (67-100%)] [10:Fascia: No Tendon: No Muscle: No Joint: No Bone: No Medium (34-66%)] [11:Fascia: No Tendon: No Muscle: No Joint: No Bone: No None] Epithelialization: [1:N/A] [10:N/A] [11:Callused periwound] Assessment Notes: [1:Compression Therapy] [10:Compression Therapy] [11:N/A] Wound Number: 2 4 6  Photos: No Photos No Photos No Photos Right, Posterior Lower Leg Left, Dorsal Foot Left, Posterior Lower Leg Wound  Location: Gradually Appeared Blister Gradually Appeared Wounding Event: Venous Leg Ulcer Lymphedema Lymphedema Primary Etiology: Lymphedema N/A Venous Leg Ulcer Secondary Etiology: Anemia, Chronic Obstructive Anemia, Chronic Obstructive Anemia, Chronic Obstructive Comorbid History: Pulmonary Disease (COPD), Pulmonary Disease (COPD), Pulmonary Disease (COPD), Arrhythmia, Congestive Heart Failure, Arrhythmia, Congestive Heart Failure, Arrhythmia, Congestive Heart Failure, Coronary Artery Disease, Coronary Artery Disease, Coronary Artery Disease, Hypertension, Cirrhosis , Osteoarthritis Hypertension, Cirrhosis , Osteoarthritis Hypertension, Cirrhosis , Osteoarthritis 11/20/2019 11/20/2019 11/20/2019 Date Acquired: 3 3 3  Weeks of Treatment: Open Open Open Wound Status: 1.3x1.3x0.1 0.9x1.1x0.1 0.5x0.3x0.1 Measurements L x W x D (cm) 1.327 0.778 0.118 A (cm) : rea 0.133 0.078 0.012 Volume (cm) : 50.30% 45.60% 98.80% % Reduction in Area: 50.20% 45.50% 98.80% % Reduction in Volume: Full Thickness Without Exposed Full Thickness Without Exposed Full Thickness Without Exposed Classification: Support Structures Support Structures Support Structures Medium Medium Medium Exudate Amount: Serosanguineous Serosanguineous Sanguinous Exudate Type: red, brown red, brown red Exudate Color: Distinct, outline attached Distinct, outline attached Distinct, outline attached Wound Margin: Small (1-33%) Small (1-33%) Large (67-100%) Granulation Amount: Pink Pink Red, Pink Granulation Quality: Large (67-100%) Medium (34-66%) Small (1-33%) Necrotic Amount: Fat Layer (Subcutaneous Tissue): Yes Fat Layer (Subcutaneous Tissue): Yes Fat Layer (Subcutaneous Tissue): Yes Exposed Structures: Fascia: No Fascia: No Fascia: No Tendon: No Tendon: No Tendon: No Muscle: No Muscle: No Muscle: No Joint: No Joint: No Joint: No Bone: No Bone: No Bone: No Medium (34-66%) Medium (34-66%) Medium  (34-66%) Epithelialization: N/A N/A N/A Assessment Notes: Compression Therapy Compression Therapy Compression Therapy Procedures Performed: Wound Number: 9 N/A N/A Photos: No Photos N/A N/A Left, Proximal, Posterior Upper Leg N/A N/A Wound Location: Pressure Injury N/A N/A Wounding Event: Pressure Ulcer N/A N/A Primary Etiology: N/A N/A N/A Secondary Etiology: Anemia, Chronic Obstructive N/A N/A Comorbid History: Pulmonary Disease (COPD), Arrhythmia, Congestive Heart Failure, Coronary Artery Disease, Hypertension, Cirrhosis , Osteoarthritis 05/23/2019 N/A N/A Date Acquired: 3 N/A N/A Weeks of Treatment: Open N/A N/A Wound Status: 0.6x0.6x0.1 N/A N/A Measurements L x W x D (cm) 0.283 N/A N/A A (cm) : rea 0.028 N/A N/A Volume (cm) : 9.90% N/A N/A % Reduction in A rea: 9.70% N/A N/A % Reduction in Volume: Category/Stage II N/A N/A Classification: Medium N/A N/A Exudate A mount: Serosanguineous  N/A N/A Exudate Type: red, brown N/A N/A Exudate Color: Distinct, outline attached N/A N/A Wound Margin: Small (1-33%) N/A N/A Granulation A mount: Pink N/A N/A Granulation Quality: Large (67-100%) N/A N/A Necrotic A mount: Fat Layer (Subcutaneous Tissue): Yes N/A N/A Exposed Structures: Fascia: No Tendon: No Muscle: No Joint: No Bone: No Small (1-33%) N/A N/A Epithelialization: N/A N/A N/A Assessment Notes: Compression Therapy N/A N/A Procedures Performed: Treatment Notes Electronic Signature(s) Signed: 07/05/2020 5:46:09 PM By: Linton Ham MD Signed: 07/05/2020 5:53:43 PM By: [name] Entered By: Linton Ham on 07/05/2020 10:26:13 -------------------------------------------------------------------------------- Multi-Disciplinary Care Plan Details Patient Name: Date of Service: [name], [name] 07/05/2020 9:00 A M Medical Record Number: [id number] Patient Account Number: [id number] Date of Birth/Sex: Treating RN: May 29, 1940 (80 y.o. [name] Primary Care Day Greb: [name] Other Clinician: Referring Tationna Fullard: Treating Daunte Oestreich/Extender: [name] in Treatment: 3 Active Inactive Wound/Skin Impairment Nursing Diagnoses: Impaired tissue integrity Knowledge deficit related to ulceration/compromised skin integrity Goals: Patient will have a decrease in wound volume by X% from date: (specify in notes) Date Initiated: 06/12/2020 Target Resolution Date: 08/17/2020 Goal Status: Active Patient/caregiver will verbalize understanding of skin care regimen Date Initiated: 06/12/2020 Target Resolution Date: 07/20/2020 Goal Status: Active Ulcer/skin breakdown will have a volume reduction of 30% by week 4 Date Initiated: 06/12/2020 Date Inactivated: 07/05/2020 Target Resolution Date: 06/29/2020 Unmet Reason: see wound Goal Status: Unmet measurements. Interventions: Assess patient/caregiver ability to obtain necessary supplies Assess patient/caregiver ability to perform ulcer/skin care regimen upon admission and as needed Assess ulceration(s) every visit Notes: Electronic Signature(s) Signed: 07/05/2020 5:53:43 PM By: [name] Entered By: [name] on 07/05/2020 09:28:45 -------------------------------------------------------------------------------- Pain Assessment Details Patient Name: Date of Service: [name], [name] 07/05/2020 9:00 [place] Record Number: [id number] Patient Account Number: [id number] Date of Birth/Sex: Treating RN: Jan 13, 1941 (80 y.o. [name] Primary Care Valeri Sula: [name] Other Clinician: Referring Pama Roskos: Treating Caylin Nass/Extender: [name] in Treatment: 3 Active Problems Location of Pain Severity and Description of Pain Patient Has Paino No Site Locations Pain Management and Medication Current Pain Management: Electronic Signature(s) Signed: 07/05/2020 12:58:14 PM By:  Sandre Kitty Signed: 07/05/2020 5:53:43 PM By: [name] Entered By: Sandre Kitty on 07/05/2020 09:04:56 -------------------------------------------------------------------------------- Patient/Caregiver Education Details Patient Name: Date of Service: [name], [place]. 3/17/2022andnbsp9:00 [place] Record Number: [id number] Patient Account Number: [id number] Date of Birth/Gender: Treating RN: [birth year] (80 y.o. [name] Primary Care Physician: [name] Other Clinician: Referring Physician: Treating Physician/Extender: [name] in Treatment: 3 Education Assessment Education Provided To: Patient Education Topics Provided Wound/Skin Impairment: Handouts: Skin Care Do's and Dont's Methods: Explain/Verbal Responses: Reinforcements needed Electronic Signature(s) Signed: 07/05/2020 5:53:43 PM By: [name] Entered By: [name] on 07/05/2020 09:28:59 -------------------------------------------------------------------------------- Wound Assessment Details Patient Name: Date of Service: [name], [name] 07/05/2020 9:00 A M Medical Record Number: [id number] Patient Account Number: [id number] Date of Birth/Sex: Treating RN: [birth year] (80 y.o. [name] Primary Care Afsa Meany: [name] Other Clinician: Referring Kalaya Infantino: Treating Lizabeth Fellner/Extender: [name] in Treatment: 3 Wound Status Wound Number: 1 Primary Venous Leg Ulcer Etiology: Wound Location: Right, Medial Malleolus Secondary Lymphedema Wounding Event: Gradually Appeared Etiology: Date Acquired: 11/20/2019 Wound Open Weeks Of Treatment: 3 Status: Clustered Wound: No Comorbid Anemia, Chronic Obstructive Pulmonary Disease (COPD), History: Arrhythmia, Congestive Heart Failure, Coronary Artery Disease, Hypertension, Cirrhosis , Osteoarthritis Photos Wound Measurements Length: (cm)  0.9  Width: (cm) 1.4 Depth: (cm) 0.1 Area: (cm) 0.99 Volume: (cm) 0.099 Wound Description Classification: Full Thickness Without Exposed Support Structu Wound Margin: Distinct, outline attached Exudate Amount: Medium Exudate Type: Serosanguineous Exudate Color: red, brown Foul Odor After Cleansing: Slough/Fibrino % Reduction in Area: 91.7% % Reduction in Volume: 91.7% Epithelialization: Large (67-100%) Tunneling: No Undermining: No res No No Wound Bed Granulation Amount: Large (67-100%) Exposed Structure Granulation Quality: Pink Fascia Exposed: No Necrotic Amount: None Present (0%) Fat Layer (Subcutaneous Tissue) Exposed: Yes Tendon Exposed: No Muscle Exposed: No Joint Exposed: No Bone Exposed: No Treatment Notes Wound #1 (Malleolus) Wound Laterality: Right, Medial Cleanser Wound Cleanser Discharge Instruction: Cleanse the wound with wound cleanser prior to applying a clean dressing using gauze sponges, not tissue or cotton balls. Soap and Water Discharge Instruction: May shower and wash wound with dial antibacterial soap and water prior to dressing change. Peri-Wound Care Topical Primary Dressing KerraCel Ag Gelling Fiber Dressing, 4x5 in (silver alginate) Discharge Instruction: Apply silver alginate to wound bed as instructed Secondary Dressing ABD Pad, 5x9 Discharge Instruction: Apply over primary dressing as directed. Secured With Compression Wrap ThreePress (3 layer compression wrap) Discharge Instruction: Apply LIGHTY three layer compression as directed. Compression Stockings Add-Ons Electronic Signature(s) Signed: 07/05/2020 5:41:28 PM By: Sandre Kitty Signed: 07/05/2020 5:53:43 PM By: [name] Entered By: Sandre Kitty on 07/05/2020 17:06:07 -------------------------------------------------------------------------------- Wound Assessment Details Patient Name: Date of Service: [name], [name] 07/05/2020 9:00 A M Medical Record  Number: [id number] Patient Account Number: [id number] Date of Birth/Sex: Treating RN: 07/18/1940 (80 y.o. [name], [name] Primary Care Joseph Bias: [name] Other Clinician: Referring Cicilia Clinger: Treating Valisha Heslin/Extender: [name] in Treatment: 3 Wound Status Wound Number: 10 Primary Lymphedema Etiology: Wound Location: Left, Lateral Foot Wound Open Wounding Event: Gradually Appeared Status: Status: Date Acquired: 06/26/2020 Comorbid Anemia, Chronic Obstructive Pulmonary Disease (COPD), Weeks Of Treatment: 1 History: Arrhythmia, Congestive Heart Failure, Coronary Artery Disease, Clustered Wound: No Hypertension, Cirrhosis , Osteoarthritis Photos Wound Measurements Length: (cm) 0.3 Width: (cm) 0.3 Depth: (cm) 0.1 Area: (cm) 0.071 Volume: (cm) 0.007 % Reduction in Area: 54.8% % Reduction in Volume: 56.3% Epithelialization: Medium (34-66%) Tunneling: No Undermining: No Wound Description Classification: Full Thickness Without Exposed Support Structures Wound Margin: Distinct, outline attached Exudate Amount: Medium Exudate Type: Serosanguineous Exudate Color: red, brown Foul Odor After Cleansing: No Wound Bed Granulation Amount: Small (1-33%) Exposed Structure Granulation Quality: Red Fascia Exposed: No Necrotic Amount: Large (67-100%) Fat Layer (Subcutaneous Tissue) Exposed: Yes Necrotic Quality: Adherent Slough Tendon Exposed: No Muscle Exposed: No Joint Exposed: No Bone Exposed: No Treatment Notes Wound #10 (Foot) Wound Laterality: Left, Lateral Cleanser Wound Cleanser Discharge Instruction: Cleanse the wound with wound cleanser prior to applying a clean dressing using gauze sponges, not tissue or cotton balls. Soap and Water Discharge Instruction: May shower and wash wound with dial antibacterial soap and water prior to dressing change. Peri-Wound Care Topical Primary Dressing KerraCel Ag Gelling Fiber Dressing,  4x5 in (silver alginate) Discharge Instruction: Apply silver alginate to wound bed as instructed Secondary Dressing ABD Pad, 5x9 Discharge Instruction: Apply over primary dressing as directed. Secured With Compression Wrap ThreePress (3 layer compression wrap) Discharge Instruction: Apply LIGHTY three layer compression as directed. Compression Stockings Add-Ons Electronic Signature(s) Signed: 07/05/2020 5:41:28 PM By: Sandre Kitty Signed: 07/05/2020 5:53:43 PM By: [name] Entered By: Sandre Kitty on 07/05/2020 17:07:06 -------------------------------------------------------------------------------- Wound Assessment Details Patient Name: Date of Service: [name], [name] 07/05/2020 9:00 A M Medical Record  Number: [id number] Patient Account Number: [id number] Date of Birth/Sex: Treating RN: 25-Nov-1940 (80 y.o. [name], [name] Primary Care Amarii Bordas: [name] Other Clinician: Referring Chrisann Melaragno: Treating Chirstine Defrain/Extender: [name] in Treatment: 3 Wound Status Wound Number: 11 Primary Lymphedema Etiology: Wound Location: Left T Great oe Wound Open Wounding Event: Gradually Appeared Status: Date Acquired: 06/26/2020 Comorbid Anemia, Chronic Obstructive Pulmonary Disease (COPD), Weeks Of Treatment: 1 History: Arrhythmia, Congestive Heart Failure, Coronary Artery Disease, Clustered Wound: No Hypertension, Cirrhosis , Osteoarthritis Photos Wound Measurements Length: (cm) 0.6 Width: (cm) 0.5 Depth: (cm) 0.2 Area: (cm) 0.236 Volume: (cm) 0.047 % Reduction in Area: -198.7% % Reduction in Volume: -487.5% Epithelialization: None Tunneling: No Undermining: No Wound Description Classification: Full Thickness Without Exposed Support Structures Wound Margin: Distinct, outline attached Exudate Amount: Medium Exudate Type: Serosanguineous Exudate Color: red, brown Foul Odor After Cleansing: No Slough/Fibrino  Yes Wound Bed Granulation Amount: Medium (34-66%) Exposed Structure Granulation Quality: Red, Pink Fascia Exposed: No Necrotic Amount: Small (1-33%) Fat Layer (Subcutaneous Tissue) Exposed: Yes Necrotic Quality: Adherent Slough Tendon Exposed: No Muscle Exposed: No Joint Exposed: No Bone Exposed: No Assessment Notes Callused periwound Treatment Notes Wound #11 (Toe Great) Wound Laterality: Left Cleanser Wound Cleanser Discharge Instruction: Cleanse the wound with wound cleanser prior to applying a clean dressing using gauze sponges, not tissue or cotton balls. Soap and Water Discharge Instruction: May shower and wash wound with dial antibacterial soap and water prior to dressing change. Peri-Wound Care Topical Primary Dressing KerraCel Ag Gelling Fiber Dressing, 4x5 in (silver alginate) Discharge Instruction: Apply silver alginate to wound bed as instructed Secondary Dressing ABD Pad, 5x9 Discharge Instruction: Apply over primary dressing as directed. Secured With Compression Wrap ThreePress (3 layer compression wrap) Discharge Instruction: Apply LIGHTY three layer compression as directed. Compression Stockings Add-Ons Electronic Signature(s) Signed: 07/05/2020 5:41:28 PM By: Sandre Kitty Signed: 07/05/2020 5:53:43 PM By: [name] Entered By: Sandre Kitty on 07/05/2020 17:09:16 -------------------------------------------------------------------------------- Wound Assessment Details Patient Name: Date of Service: [name], [name] 07/05/2020 9:00 A M Medical Record Number: [id number] Patient Account Number: [id number] Date of Birth/Sex: Treating RN: 03-09-1941 (80 y.o. [name], [name] Primary Care Ison Wichmann: [name] Other Clinician: Referring Onie Kasparek: Treating Yarielys Beed/Extender: [name] in Treatment: 3 Wound Status Wound Number: 2 Primary Venous Leg Ulcer Etiology: Wound Location: Right, Posterior Lower  Leg Secondary Lymphedema Wounding Event: Gradually Appeared Etiology: Date Acquired: 11/20/2019 Wound Open Weeks Of Treatment: 3 Status: Clustered Wound: No Comorbid Anemia, Chronic Obstructive Pulmonary Disease (COPD), History: Arrhythmia, Congestive Heart Failure, Coronary Artery Disease, Hypertension, Cirrhosis , Osteoarthritis Photos Wound Measurements Length: (cm) 1.3 Width: (cm) 1.3 Depth: (cm) 0.1 Area: (cm) 1.327 Volume: (cm) 0.133 % Reduction in Area: 50.3% % Reduction in Volume: 50.2% Epithelialization: Medium (34-66%) Tunneling: No Undermining: No Wound Description Classification: Full Thickness Without Exposed Support Structures Wound Margin: Distinct, outline attached Exudate Amount: Medium Exudate Type: Serosanguineous Exudate Color: red, brown Foul Odor After Cleansing: No Slough/Fibrino Yes Wound Bed Granulation Amount: Small (1-33%) Exposed Structure Granulation Quality: Pink Fascia Exposed: No Necrotic Amount: Large (67-100%) Fat Layer (Subcutaneous Tissue) Exposed: Yes Necrotic Quality: Adherent Slough Tendon Exposed: No Muscle Exposed: No Joint Exposed: No Bone Exposed: No Treatment Notes Wound #2 (Lower Leg) Wound Laterality: Right, Posterior Cleanser Wound Cleanser Discharge Instruction: Cleanse the wound with wound cleanser prior to applying a clean dressing using gauze sponges, not tissue or cotton balls. Soap and Water Discharge Instruction: May shower and wash wound with dial antibacterial soap and  water prior to dressing change. Peri-Wound Care Topical Primary Dressing KerraCel Ag Gelling Fiber Dressing, 4x5 in (silver alginate) Discharge Instruction: Apply silver alginate to wound bed as instructed Secondary Dressing ABD Pad, 5x9 Discharge Instruction: Apply over primary dressing as directed. Secured With Compression Wrap ThreePress (3 layer compression wrap) Discharge Instruction: Apply LIGHTY three layer compression as  directed. Compression Stockings Add-Ons Electronic Signature(s) Signed: 07/05/2020 5:41:28 PM By: Sandre Kitty Signed: 07/05/2020 5:53:43 PM By: [name] Entered By: Sandre Kitty on 07/05/2020 17:06:25 -------------------------------------------------------------------------------- Wound Assessment Details Patient Name: Date of Service: [name], [name] 07/05/2020 9:00 A M Medical Record Number: [id number] Patient Account Number: [id number] Date of Birth/Sex: Treating RN: 06/11/1940 (80 y.o. [name], [name] Primary Care Dyamond Tolosa: [name] Other Clinician: Referring Shanetra Blumenstock: Treating Ahlijah Raia/Extender: [name] in Treatment: 3 Wound Status Wound Number: 4 Primary Lymphedema Etiology: Wound Location: Left, Dorsal Foot Wound Open Wounding Event: Blister Status: Date Acquired: 11/20/2019 Comorbid Anemia, Chronic Obstructive Pulmonary Disease (COPD), Weeks Of Treatment: 3 History: Arrhythmia, Congestive Heart Failure, Coronary Artery Disease, Clustered Wound: No Hypertension, Cirrhosis , Osteoarthritis Photos Wound Measurements Length: (cm) 0.9 Width: (cm) 1.1 Depth: (cm) 0.1 Area: (cm) 0.778 Volume: (cm) 0.078 % Reduction in Area: 45.6% % Reduction in Volume: 45.5% Epithelialization: Medium (34-66%) Tunneling: No Undermining: No Wound Description Classification: Full Thickness Without Exposed Support Structures Wound Margin: Distinct, outline attached Exudate Amount: Medium Exudate Type: Serosanguineous Exudate Color: red, brown Foul Odor After Cleansing: No Slough/Fibrino Yes Wound Bed Granulation Amount: Small (1-33%) Exposed Structure Granulation Quality: Pink Fascia Exposed: No Necrotic Amount: Medium (34-66%) Fat Layer (Subcutaneous Tissue) Exposed: Yes Necrotic Quality: Adherent Slough Tendon Exposed: No Muscle Exposed: No Joint Exposed: No Bone Exposed: No Treatment Notes Wound #4 (Foot)  Wound Laterality: Dorsal, Left Cleanser Wound Cleanser Discharge Instruction: Cleanse the wound with wound cleanser prior to applying a clean dressing using gauze sponges, not tissue or cotton balls. Soap and Water Discharge Instruction: May shower and wash wound with dial antibacterial soap and water prior to dressing change. Peri-Wound Care Topical Primary Dressing KerraCel Ag Gelling Fiber Dressing, 4x5 in (silver alginate) Discharge Instruction: Apply silver alginate to wound bed as instructed Secondary Dressing ABD Pad, 5x9 Discharge Instruction: Apply over primary dressing as directed. Secured With Compression Wrap ThreePress (3 layer compression wrap) Discharge Instruction: Apply LIGHTY three layer compression as directed. Compression Stockings Add-Ons Electronic Signature(s) Signed: 07/05/2020 5:41:28 PM By: Sandre Kitty Signed: 07/05/2020 5:53:43 PM By: [name] Entered By: Sandre Kitty on 07/05/2020 17:06:44 -------------------------------------------------------------------------------- Wound Assessment Details Patient Name: Date of Service: [name], [name] 07/05/2020 9:00 A M Medical Record Number: [id number] Patient Account Number: [id number] Date of Birth/Sex: Treating RN: 23-Sep-1940 (80 y.o. [name], [name] Primary Care Mikie Misner: [name] Other Clinician: Referring Lael Wetherbee: Treating Carel Schnee/Extender: [name] in Treatment: 3 Wound Status Wound Number: 6 Primary Lymphedema Etiology: Wound Location: Left, Posterior Lower Leg Secondary Venous Leg Ulcer Wounding Event: Gradually Appeared Etiology: Date Acquired: 11/20/2019 Wound Open Weeks Of Treatment: 3 Status: Clustered Wound: No Comorbid Anemia, Chronic Obstructive Pulmonary Disease (COPD), History: Arrhythmia, Congestive Heart Failure, Coronary Artery Disease, Hypertension, Cirrhosis , Osteoarthritis Photos Wound Measurements Length: (cm)  0.5 Width: (cm) 0.3 Depth: (cm) 0.1 Area: (cm) 0.118 Volume: (cm) 0.012 % Reduction in Area: 98.8% % Reduction in Volume: 98.8% Epithelialization: Medium (34-66%) Tunneling: No Undermining: No Wound Description Classification: Full Thickness Without Exposed Support Structures Wound Margin: Distinct, outline attached Exudate Amount: Medium Exudate Type: Sanguinous  Exudate Color: red Foul Odor After Cleansing: No Slough/Fibrino Yes Wound Bed Granulation Amount: Large (67-100%) Exposed Structure Granulation Quality: Red, Pink Fascia Exposed: No Necrotic Amount: Small (1-33%) Fat Layer (Subcutaneous Tissue) Exposed: Yes Necrotic Quality: Adherent Slough Tendon Exposed: No Muscle Exposed: No Joint Exposed: No Bone Exposed: No Treatment Notes Wound #6 (Lower Leg) Wound Laterality: Left, Posterior Cleanser Wound Cleanser Discharge Instruction: Cleanse the wound with wound cleanser prior to applying a clean dressing using gauze sponges, not tissue or cotton balls. Soap and Water Discharge Instruction: May shower and wash wound with dial antibacterial soap and water prior to dressing change. Peri-Wound Care Topical Primary Dressing KerraCel Ag Gelling Fiber Dressing, 4x5 in (silver alginate) Discharge Instruction: Apply silver alginate to wound bed as instructed Secondary Dressing ABD Pad, 5x9 Discharge Instruction: Apply over primary dressing as directed. Secured With Compression Wrap ThreePress (3 layer compression wrap) Discharge Instruction: Apply LIGHTY three layer compression as directed. Compression Stockings Add-Ons Electronic Signature(s) Signed: 07/05/2020 5:41:28 PM By: Sandre Kitty Signed: 07/05/2020 5:53:43 PM By: [name] Entered By: Sandre Kitty on 07/05/2020 17:08:56 -------------------------------------------------------------------------------- Wound Assessment Details Patient Name: Date of Service: [name], [name] 07/05/2020 9:00 A  M Medical Record Number: [id number] Patient Account Number: [id number] Date of Birth/Sex: Treating RN: Dec 06, 1940 (80 y.o. [name], [name] Primary Care Rhylynn Perdomo: [name] Other Clinician: Referring Everli Rother: Treating Yalonda Sample/Extender: [name] in Treatment: 3 Wound Status Wound Number: 9 Primary Pressure Ulcer Etiology: Wound Location: Left, Proximal, Posterior Upper Leg Wound Open Wounding Event: Pressure Injury Status: Date Acquired: 05/23/2019 Comorbid Anemia, Chronic Obstructive Pulmonary Disease (COPD), Weeks Of Treatment: 3 History: Arrhythmia, Congestive Heart Failure, Coronary Artery Disease, Clustered Wound: No Hypertension, Cirrhosis , Osteoarthritis Photos Wound Measurements Length: (cm) 0.6 Width: (cm) 0.6 Depth: (cm) 0.1 Area: (cm) 0.283 Volume: (cm) 0.028 % Reduction in Area: 9.9% % Reduction in Volume: 9.7% Epithelialization: Small (1-33%) Tunneling: No Undermining: No Wound Description Classification: Category/Stage II Wound Margin: Distinct, outline attached Exudate Amount: Medium Exudate Type: Serosanguineous Exudate Color: red, brown Foul Odor After Cleansing: No Slough/Fibrino Yes Wound Bed Granulation Amount: Small (1-33%) Exposed Structure Granulation Quality: Pink Fascia Exposed: No Necrotic Amount: Large (67-100%) Fat Layer (Subcutaneous Tissue) Exposed: Yes Necrotic Quality: Adherent Slough Tendon Exposed: No Muscle Exposed: No Joint Exposed: No Bone Exposed: No Treatment Notes Wound #9 (Upper Leg) Wound Laterality: Left, Posterior, Proximal Cleanser Wound Cleanser Discharge Instruction: Cleanse the wound with wound cleanser prior to applying a clean dressing using gauze sponges, not tissue or cotton balls. Soap and Water Discharge Instruction: May shower and wash wound with dial antibacterial soap and water prior to dressing change. Peri-Wound Care Topical Primary Dressing KerraCel Ag  Gelling Fiber Dressing, 4x5 in (silver alginate) Discharge Instruction: Apply silver alginate to wound bed as instructed Secondary Dressing ComfortFoam Border, 3x3 in (silicone border) Discharge Instruction: Apply over primary dressing as directed. Secured With Compression Wrap ThreePress (3 layer compression wrap) Discharge Instruction: Apply LIGHTY three layer compression as directed. Compression Stockings Add-Ons Electronic Signature(s) Signed: 07/05/2020 5:41:28 PM By: Sandre Kitty Signed: 07/05/2020 5:53:43 PM By: [name] Entered By: Sandre Kitty on 07/05/2020 17:08:36 -------------------------------------------------------------------------------- Vitals Details Patient Name: Date of Service: [name], [name] 07/05/2020 9:00 A M Medical Record Number: [id number] Patient Account Number: [id number] Date of Birth/Sex: Treating RN: 1940-05-18 (80 y.o. [name] Primary Care Annibelle Brazie: [name] Other Clinician: Referring Shanan Mcmiller: Treating Rainee Sweatt/Extender: [name] in Treatment: 3 Vital Signs Time Taken: 09:04 Temperature (  F): 97.6 Height (in): 71 Pulse (bpm): 76 Weight (lbs): 178 Respiratory Rate (breaths/min): 17 Body Mass Index (BMI): 24.8 Blood Pressure (mmHg): 107/57 Reference Range: 80 - 120 mg / dl Electronic Signature(s) Signed: 07/05/2020 12:58:14 PM By: Sandre Kitty Entered By: Sandre Kitty on 07/05/2020 09:04:34

## 2020-07-05 NOTE — Progress Notes (Signed)
[name], [name] ([id number]) Visit Report for 07/05/2020 HPI Details Patient Name: Date of Service: [name], Brian RLES E. 07/05/2020 9:00 A M Medical Record Number: [id number] Patient Account Number: [id number] Date of Birth/Sex: Treating RN: 1940/10/05 (80 y.o. [name] Primary Care Provider: [name] Other Clinician: Referring Provider: Treating Provider/Extender: [name] in Treatment: 3 History of Present Illness HPI Description: ADMISSION 06/12/2020 This is a 80 year old man who has had wounds on his bilateral lower legs and feet for several months now. Looking in epic he saw his primary physician and early November at which time he had an ulcer of the right calf. He was seen again by his primary doctor on 05/12/2020 at which time he had bilateral lower extremity ulcers. He was reviewed by Dr. Trula Slade of vein and vascular in the spring 2021. He had arterial studies done on 08/12/2019 showing an ABI on the right and 0.74 on the left and 0.69. TBI of the right of 0.29 on the left 0.35 with monophasic waveforms bilaterally. He also had venous reflux studies on 09/29/2019 that did not show evidence of DVT or SVT bilaterally. In the right he had superficial vein reflux in the SSV and mid thigh AASV greater saphenous vein had previously been harvested. On the left he did not have any superficial vein reflux but it was noted that he had interstitial fluid without throughout the lower extremity. He also has a history of CABG, congestive heart failure followed by Dr. Stanford Breed and apparently liver cirrhosis. He takes Lasix 80 mg as well as spironolactone. He arrives in clinic today with 9 wounds. Most of these are superficial and look like they started with blisters. He has areas on the left dorsal foot x2, left posterior calf left anterior tibial left posterior thigh x2 the right medial lower extremity and the right medial malleolus. He has pitting  edema extending well up into his thighs skin changes look like chronic stasis dermatitis. Dr. Trula Slade saw him in May noted that he had tolerated Unna boots and was feeling better. Also noted the moderate arterial insufficiency and suggested if he developed nonhealing wounds then he may need attempted angiography. Past medical history extensive including multiple left hip surgeries, congestive heart failure, cirrhosis, peripheral arterial disease, chronic atrial fibrillation, recurrent cellulitis of the legs, AAA, carotid stenosis, COPD, hypertension and hyperlipidemia We did not attempt his arterial ABIs in our clinic today because of swelling 3/1; this is a patient I admitted to the clinic last week he has multiple wounds on his bilateral lower extremities. I think he has chronic venous insufficiency and PAD. He tolerated the "light" 3 layer wrap I put him on last week and the edema control is better. 06/26/2020 upon evaluation today patient appears to be doing well with regard to his legs overall. He does have several areas of blistering but to be perfectly honest this seems to be making some progress here. Fortunately there is no signs of active infection which is great news. No fevers, chills, nausea, vomiting, or diarrhea. He does have a lot of callus over the great toe that is going require some sharp debridement. I discussed that with him today however I am that I do this extremely likely considering the fact that he does appear to potentially have some peripheral vascular disease specifically of the arterial type. Nonetheless he does have a an appointment on March 30 for formal arterial studies. 3/17; bilateral lower extremity wounds in the setting of chronic venous  insufficiency and arterial insufficiency. He has a follow-up with Dr. Oneida Alar later this month and he is going to have follow-up noninvasive studies. His renal studies done in April 2021 were really very poor with ABIs of 0.74 and  0.69. TBI's less than 0.3 bilateral Electronic Signature(s) Signed: 07/05/2020 5:46:09 PM By: Linton Ham MD Entered By: Linton Ham on 07/05/2020 10:29:19 -------------------------------------------------------------------------------- Physical Exam Details Patient Name: Date of Service: [name], Brian RLES E. 07/05/2020 9:00 A M Medical Record Number: [id number] Patient Account Number: [id number] Date of Birth/Sex: Treating RN: Jul 19, 1940 (80 y.o. [name] Primary Care Provider: [name] Other Clinician: Referring Provider: Treating Provider/Extender: [name] in Treatment: 3 Cardiovascular Not palpable in the popliteal or femoral. Pedal pulses absent bilaterally.. Notes Wound exam; the patient's wounds actually look quite a bit better. Still substantially on the right medial lower leg right dorsal knee areas on the inner part of his left thigh and bilaterally in the posterior calves. But this is all a lot better than when I first saw him. Electronic Signature(s) Signed: 07/05/2020 5:46:09 PM By: Linton Ham MD Entered By: Linton Ham on 07/05/2020 10:31:29 -------------------------------------------------------------------------------- Physician Orders Details Patient Name: Date of Service: [name], Brian RLES E. 07/05/2020 9:00 A M Medical Record Number: [id number] Patient Account Number: [id number] Date of Birth/Sex: Treating RN: [birth year] (80 y.o. 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 - ***Hoyer extra time*** Return A Bathing/ Shower/ Hygiene May shower with protection but do not get wound dressing(s) wet. - May use cast protectors over wraps. You can find these at [facility] or [facility] Edema Control - Lymphedema / SCD / Other Elevate legs to the level of the heart or above for 30 minutes daily and/or when sitting, a frequency of: Avoid standing for long periods of time. Non Wound Condition Protect area with: - Cover scrotal area with zinc oxide and cover with ABD pads Home Health dmit to [place] for wound care. May utilize formulary equivalent dressing for wound treatment orders unless otherwise specified. - A weekly by home health and weekly by wound center. Home Health also for physical Therapy to evaluate and treat. Wound Treatment Wound #1 - Malleolus Wound Laterality: Right, Medial Cleanser: Wound Cleanser 2 x Per Week/15 Days Discharge Instructions: Cleanse the wound with wound cleanser prior to applying a clean dressing using gauze sponges, not tissue or cotton balls. Cleanser: Soap and Water 2 x Per Week/15 Days Discharge Instructions: May shower and wash wound with dial antibacterial soap and water prior to dressing change. Prim Dressing: KerraCel Ag Gelling Fiber Dressing, 4x5 in (silver alginate) 2 x Per Week/15 Days ary Discharge Instructions: Apply silver alginate to wound bed as instructed Secondary Dressing: ABD Pad, 5x9 2 x Per Week/15 Days Discharge Instructions: Apply over primary dressing as directed. Compression Wrap: ThreePress (3 layer compression wrap) 2 x Per Week/15  Days Discharge Instructions: Apply LIGHTY three layer compression as directed. Wound #10 - Foot Wound Laterality: Left, Lateral Cleanser: Wound Cleanser 2 x Per Week/15 Days Discharge Instructions: Cleanse the wound with wound cleanser prior to applying a clean dressing using gauze sponges, not tissue or cotton balls. Cleanser: Soap and Water 2 x Per Week/15 Days Discharge Instructions: May shower and wash wound with dial antibacterial soap and water prior to dressing change. Prim Dressing: KerraCel Ag Gelling Fiber Dressing, 4x5 in (silver alginate) 2 x Per Week/15 Days ary Discharge Instructions: Apply silver alginate to wound bed as instructed Secondary Dressing: ABD Pad, 5x9 2 x Per Week/15 Days Discharge Instructions: Apply over primary dressing as directed. Compression Wrap: ThreePress (3 layer compression wrap) 2 x Per Week/15 Days Discharge Instructions: Apply LIGHTY three layer compression as directed. Wound #11 - T Great oe Wound Laterality: Left Cleanser: Wound Cleanser 2 x Per Week/15 Days Discharge Instructions: Cleanse the wound with wound cleanser prior to applying a clean dressing using gauze sponges, not tissue or cotton balls. Cleanser: Soap and Water 2 x Per Week/15 Days Discharge Instructions: May shower and wash wound with dial antibacterial soap and water prior to dressing change. Prim Dressing: KerraCel Ag Gelling Fiber Dressing, 4x5 in (silver alginate) 2 x Per Week/15 Days ary Discharge Instructions: Apply silver alginate to wound bed as instructed Secondary Dressing: ABD Pad, 5x9 2 x Per Week/15 Days Discharge Instructions: Apply over primary dressing as directed. Compression Wrap: ThreePress (3 layer compression wrap) 2 x Per Week/15 Days Discharge Instructions: Apply LIGHTY three layer compression as directed. Wound #2 - Lower Leg Wound Laterality: Right, Posterior Cleanser: Wound Cleanser 2 x Per Week/15 Days Discharge Instructions: Cleanse the wound with  wound cleanser prior to applying a clean dressing using gauze sponges, not tissue or cotton balls. Cleanser: Soap and Water 2 x Per Week/15 Days Discharge Instructions: May shower and wash wound with dial antibacterial soap and water prior to dressing change. Prim Dressing: KerraCel Ag Gelling Fiber Dressing, 4x5 in (silver alginate) 2 x Per Week/15 Days ary Discharge Instructions: Apply silver alginate to wound bed as instructed Secondary Dressing: ABD Pad, 5x9 2 x Per Week/15 Days Discharge Instructions: Apply over primary dressing as directed. Compression Wrap: ThreePress (3 layer compression wrap) 2 x Per Week/15 Days Discharge Instructions: Apply LIGHTY three layer compression as directed. Wound #4 - Foot Wound Laterality: Dorsal, Left Cleanser: Wound Cleanser 2 x Per Week/15 Days Discharge Instructions: Cleanse the wound with wound cleanser prior to applying a clean dressing using gauze sponges, not tissue or cotton balls. Cleanser: Soap and Water 2 x Per Week/15 Days Discharge Instructions: May shower and wash wound with dial antibacterial soap and water prior to dressing change. Prim Dressing: KerraCel Ag Gelling Fiber Dressing, 4x5 in (silver alginate) 2 x Per Week/15 Days ary Discharge Instructions: Apply silver alginate to wound bed as instructed Secondary Dressing: ABD Pad, 5x9 2 x Per Week/15 Days Discharge Instructions: Apply over primary dressing as directed. Compression Wrap: ThreePress (3 layer compression wrap) 2 x Per Week/15 Days Discharge Instructions: Apply LIGHTY three layer compression as directed. Wound #6 - Lower Leg Wound Laterality: Left, Posterior Cleanser: Wound Cleanser 2 x Per Week/15 Days Discharge Instructions: Cleanse the wound with wound cleanser prior to applying a clean dressing using gauze sponges, not tissue or cotton balls. Cleanser: Soap and Water 2 x Per Week/15 Days Discharge Instructions: May shower and wash wound with dial antibacterial soap and  water prior to dressing change. Prim  Dressing: KerraCel Ag Gelling Fiber Dressing, 4x5 in (silver alginate) 2 x Per Week/15 Days ary Discharge Instructions: Apply silver alginate to wound bed as instructed Secondary Dressing: ABD Pad, 5x9 2 x Per Week/15 Days Discharge Instructions: Apply over primary dressing as directed. Compression Wrap: ThreePress (3 layer compression wrap) 2 x Per Week/15 Days Discharge Instructions: Apply LIGHTY three layer compression as directed. Wound #9 - Upper Leg Wound Laterality: Left, Posterior, Proximal Cleanser: Wound Cleanser 2 x Per Week/15 Days Discharge Instructions: Cleanse the wound with wound cleanser prior to applying a clean dressing using gauze sponges, not tissue or cotton balls. Cleanser: Soap and Water 2 x Per Week/15 Days Discharge Instructions: May shower and wash wound with dial antibacterial soap and water prior to dressing change. Prim Dressing: KerraCel Ag Gelling Fiber Dressing, 4x5 in (silver alginate) 2 x Per Week/15 Days ary Discharge Instructions: Apply silver alginate to wound bed as instructed Secondary Dressing: ComfortFoam Border, 3x3 in (silicone border) 2 x Per Week/15 Days Discharge Instructions: Apply over primary dressing as directed. Compression Wrap: ThreePress (3 layer compression wrap) 2 x Per Week/15 Days Discharge Instructions: Apply LIGHTY three layer compression as directed. Electronic Signature(s) Signed: 07/05/2020 5:46:09 PM By: Linton Ham MD Signed: 07/05/2020 5:53:43 PM By: [name] Entered By: [name] on 07/05/2020 10:07:04 -------------------------------------------------------------------------------- Problem List Details Patient Name: Date of Service: [name], Brian RLES E. 07/05/2020 9:00 A M Medical Record Number: [id number] Patient Account Number: [id number] Date of Birth/Sex: Treating RN: Apr 06, 1941 (80 y.o. 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, bilateral legs 06/12/2020 No Yes L97.528 Non-pressure chronic ulcer of other part of left foot with other specified 06/12/2020 No Yes severity L97.228 Non-pressure chronic ulcer of left calf with other specified severity 06/12/2020 No Yes L97.128 Non-pressure chronic ulcer of left thigh with other specified severity 06/12/2020 No Yes L97.818 Non-pressure chronic ulcer of other part of right lower leg with other specified 06/12/2020 No Yes severity Inactive Problems Resolved Problems Electronic Signature(s) Signed: 07/05/2020 5:46:09 PM By: Linton Ham MD Entered By: Linton Ham on 07/05/2020 10:26:01 -------------------------------------------------------------------------------- Progress Note Details Patient Name: Date of Service: [name], Brian RLES E. 07/05/2020 9:00 A M Medical Record Number: [id number] Patient Account Number: [id number] Date of Birth/Sex: Treating RN: 28-May-1940 (80 y.o. [name] Primary Care Provider: [name] Other Clinician: Referring Provider: Treating Provider/Extender: [name] in Treatment: 3 Subjective History of Present Illness (HPI) ADMISSION 06/12/2020 This is a 80 year old man who has had wounds on his bilateral lower legs and feet for several months now. Looking in epic he saw his primary physician and early November at which time he had an ulcer of the right calf. He was seen again by his primary doctor on 05/12/2020 at which time he had bilateral lower extremity ulcers. He was reviewed by Dr. Trula Slade of vein and  vascular in the spring 2021. He had arterial studies done on 08/12/2019 showing an ABI on the right and 0.74 on the left and 0.69. TBI of the right of 0.29 on the left 0.35 with monophasic waveforms bilaterally. He also had venous reflux studies on 09/29/2019 that did not show evidence of DVT or SVT bilaterally. In the right he  had superficial vein reflux in the SSV and mid thigh AASV greater saphenous vein had previously been harvested. On the left he did not have any superficial vein reflux but it was noted that he had interstitial fluid without throughout the lower extremity. He also has a history of CABG, congestive heart failure followed by Dr. Stanford Breed and apparently liver cirrhosis. He takes Lasix 80 mg as well as spironolactone. He arrives in clinic today with 9 wounds. Most of these are superficial and look like they started with blisters. He has areas on the left dorsal foot x2, left posterior calf left anterior tibial left posterior thigh x2 the right medial lower extremity and the right medial malleolus. He has pitting edema extending well up into his thighs skin changes look like chronic stasis dermatitis. Dr. Trula Slade saw him in May noted that he had tolerated Unna boots and was feeling better. Also noted the moderate arterial insufficiency and suggested if he developed nonhealing wounds then he may need attempted angiography. Past medical history extensive including multiple left hip surgeries, congestive heart failure, cirrhosis, peripheral arterial disease, chronic atrial fibrillation, recurrent cellulitis of the legs, AAA, carotid stenosis, COPD, hypertension and hyperlipidemia We did not attempt his arterial ABIs in our clinic today because of swelling 3/1; this is a patient I admitted to the clinic last week he has multiple wounds on his bilateral lower extremities. I think he has chronic venous insufficiency and PAD. He tolerated the "light" 3 layer wrap I put him on last week and the  edema control is better. 06/26/2020 upon evaluation today patient appears to be doing well with regard to his legs overall. He does have several areas of blistering but to be perfectly honest this seems to be making some progress here. Fortunately there is no signs of active infection which is great news. No fevers, chills, nausea, vomiting, or diarrhea. He does have a lot of callus over the great toe that is going require some sharp debridement. I discussed that with him today however I am that I do this extremely likely considering the fact that he does appear to potentially have some peripheral vascular disease specifically of the arterial type. Nonetheless he does have a an appointment on March 30 for formal arterial studies. 3/17; bilateral lower extremity wounds in the setting of chronic venous insufficiency and arterial insufficiency. He has a follow-up with Dr. Oneida Alar later this month and he is going to have follow-up noninvasive studies. His renal studies done in April 2021 were really very poor with ABIs of 0.74 and 0.69. TBI's less than 0.3 bilateral Objective Constitutional Vitals Time Taken: 9:04 AM, Height: 71 in, Weight: 178 lbs, BMI: 24.8, Temperature: 97.6 F, Pulse: 76 bpm, Respiratory Rate: 17 breaths/min, Blood Pressure: 107/57 mmHg. Cardiovascular Not palpable in the popliteal or femoral. Pedal pulses absent bilaterally.. General Notes: Wound exam; the patient's wounds actually look quite a bit better. Still substantially on the right medial lower leg right dorsal knee areas on the inner part of his left thigh and bilaterally in the posterior calves. But this is all a lot better than when I first saw him. Integumentary (Hair, Skin) Wound #1 status is Open. Original cause of wound was Gradually Appeared. The date acquired was: 11/20/2019. The wound has been in treatment 3 weeks. The wound is located on the Right,Medial Malleolus. The wound measures 0.9cm length x 1.4cm width x  0.1cm depth; 0.99cm^2 area and 0.099cm^3 volume. There is Fat Layer (Subcutaneous Tissue) exposed. There is no  tunneling or undermining noted. There is a medium amount of serosanguineous drainage noted. The wound margin is distinct with the outline attached to the wound base. There is large (67-100%) pink granulation within the wound bed. There is no necrotic tissue within the wound bed. Wound #10 status is Open. Original cause of wound was Gradually Appeared. The date acquired was: 06/26/2020. The wound has been in treatment 1 weeks. The wound is located on the Left,Lateral Foot. The wound measures 0.3cm length x 0.3cm width x 0.1cm depth; 0.071cm^2 area and 0.007cm^3 volume. There is Fat Layer (Subcutaneous Tissue) exposed. There is no tunneling or undermining noted. There is a medium amount of serosanguineous drainage noted. The wound margin is distinct with the outline attached to the wound base. There is small (1-33%) red granulation within the wound bed. There is a large (67-100%) amount of necrotic tissue within the wound bed including Adherent Slough. Wound #11 status is Open. Original cause of wound was Gradually Appeared. The date acquired was: 06/26/2020. The wound has been in treatment 1 weeks. The wound is located on the Left T Great. The wound measures 0.6cm length x 0.5cm width x 0.2cm depth; 0.236cm^2 area and 0.047cm^3 volume. There is Fat oe Layer (Subcutaneous Tissue) exposed. There is no tunneling or undermining noted. There is a medium amount of serosanguineous drainage noted. The wound margin is distinct with the outline attached to the wound base. There is medium (34-66%) red, pink granulation within the wound bed. There is a small (1-33%) amount of necrotic tissue within the wound bed including Adherent Slough. General Notes: Callused periwound Wound #2 status is Open. Original cause of wound was Gradually Appeared. The date acquired was: 11/20/2019. The wound has been in treatment  3 weeks. The wound is located on the Right,Posterior Lower Leg. The wound measures 1.3cm length x 1.3cm width x 0.1cm depth; 1.327cm^2 area and 0.133cm^3 volume. There is Fat Layer (Subcutaneous Tissue) exposed. There is no tunneling or undermining noted. There is a medium amount of serosanguineous drainage noted. The wound margin is distinct with the outline attached to the wound base. There is small (1-33%) pink granulation within the wound bed. There is a large (67- 100%) amount of necrotic tissue within the wound bed including Adherent Slough. Wound #4 status is Open. Original cause of wound was Blister. The date acquired was: 11/20/2019. The wound has been in treatment 3 weeks. The wound is located on the Left,Dorsal Foot. The wound measures 0.9cm length x 1.1cm width x 0.1cm depth; 0.778cm^2 area and 0.078cm^3 volume. There is Fat Layer (Subcutaneous Tissue) exposed. There is no tunneling or undermining noted. There is a medium amount of serosanguineous drainage noted. The wound margin is distinct with the outline attached to the wound base. There is small (1-33%) pink granulation within the wound bed. There is a medium (34-66%) amount of necrotic tissue within the wound bed including Adherent Slough. Wound #6 status is Open. Original cause of wound was Gradually Appeared. The date acquired was: 11/20/2019. The wound has been in treatment 3 weeks. The wound is located on the Left,Posterior Lower Leg. The wound measures 0.5cm length x 0.3cm width x 0.1cm depth; 0.118cm^2 area and 0.012cm^3 volume. There is Fat Layer (Subcutaneous Tissue) exposed. There is no tunneling or undermining noted. There is a medium amount of sanguinous drainage noted. The wound margin is distinct with the outline attached to the wound base. There is large (67-100%) red, pink granulation within the wound bed. There is a small (1-  33%) amount of necrotic tissue within the wound bed including Adherent Slough. Wound #9 status is  Open. Original cause of wound was Pressure Injury. The date acquired was: 05/23/2019. The wound has been in treatment 3 weeks. The wound is located on the Left,Proximal,Posterior Upper Leg. The wound measures 0.6cm length x 0.6cm width x 0.1cm depth; 0.283cm^2 area and 0.028cm^3 volume. There is Fat Layer (Subcutaneous Tissue) exposed. There is no tunneling or undermining noted. There is a medium amount of serosanguineous drainage noted. The wound margin is distinct with the outline attached to the wound base. There is small (1-33%) pink granulation within the wound bed. There is a large (67-100%) amount of necrotic tissue within the wound bed including Adherent Slough. 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 There was a Three Layer Compression Therapy Procedure by Baruch Gouty, RN. Post procedure Diagnosis Wound #1: Same as Pre-Procedure Wound #10 Pre-procedure diagnosis of Wound #10 is a Lymphedema located on the Left,Lateral Foot . There was a Three Layer Compression Therapy Procedure by Baruch Gouty, RN. Post procedure Diagnosis Wound #10: Same as Pre-Procedure Wound #2 Pre-procedure diagnosis of Wound #2 is a Venous Leg Ulcer located on the Right,Posterior Lower Leg . There was a Three Layer Compression Therapy Procedure by Baruch Gouty, RN. Post procedure Diagnosis Wound #2: Same as  Pre-Procedure Wound #4 Pre-procedure diagnosis of Wound #4 is a Lymphedema located on the Left,Dorsal Foot . There was a Three Layer Compression Therapy Procedure by Baruch Gouty, RN. Post procedure Diagnosis Wound #4: Same as Pre-Procedure Wound #6 Pre-procedure diagnosis of Wound #6 is a Lymphedema located on the Left,Posterior Lower Leg . There was a Three Layer Compression Therapy Procedure by Baruch Gouty, RN. Post procedure Diagnosis Wound #6: Same as Pre-Procedure Wound #9 Pre-procedure diagnosis of Wound #9 is a Pressure Ulcer located on the Left,Proximal,Posterior Upper Leg . There was a Three Layer Compression Therapy Procedure by Baruch Gouty, RN. Post procedure Diagnosis Wound #9: Same as Pre-Procedure Plan Follow-up Appointments: Return Appointment in 1 week. - ***Hoyer extra time*** Bathing/ Shower/ Hygiene: May shower with protection but do not get wound dressing(s) wet. - May use cast protectors over wraps. You can find these at [facility] or [facility] Edema Control - Lymphedema / SCD / Other: Elevate legs to the level of the heart or above for 30 minutes daily and/or when sitting, a frequency of: Avoid standing for long periods of time. Non Wound Condition: Protect area with: - Cover scrotal area with zinc oxide and cover with ABD pads Home Health: Admit to [facility] for wound care. May utilize formulary equivalent dressing for wound treatment orders unless otherwise specified. - weekly by home health and weekly by wound center. Home Health also for physical Therapy to evaluate and treat. WOUND #1: - Malleolus Wound Laterality: Right, Medial Cleanser: Wound Cleanser 2 x Per Week/15 Days Discharge Instructions: Cleanse the wound with wound cleanser prior to applying a clean dressing using gauze sponges, not tissue or cotton balls. Cleanser: Soap and Water  2 x Per Week/15 Days Discharge Instructions: May shower and wash wound with dial antibacterial soap and water  prior to dressing change. Prim Dressing: KerraCel Ag Gelling Fiber Dressing, 4x5 in (silver alginate) 2 x Per Week/15 Days ary Discharge Instructions: Apply silver alginate to wound bed as instructed Secondary Dressing: ABD Pad, 5x9 2 x Per Week/15 Days Discharge Instructions: Apply over primary dressing as directed. Com pression Wrap: ThreePress (3 layer compression wrap) 2 x Per Week/15 Days Discharge Instructions: Apply LIGHTY three layer compression as directed. WOUND #10: - Foot Wound Laterality: Left, Lateral Cleanser: Wound Cleanser 2 x Per Week/15 Days Discharge Instructions: Cleanse the wound with wound cleanser prior to applying a clean dressing using gauze sponges, not tissue or cotton balls. Cleanser: Soap and Water 2 x Per Week/15 Days Discharge Instructions: May shower and wash wound with dial antibacterial soap and water prior to dressing change. Prim Dressing: KerraCel Ag Gelling Fiber Dressing, 4x5 in (silver alginate) 2 x Per Week/15 Days ary Discharge Instructions: Apply silver alginate to wound bed as instructed Secondary Dressing: ABD Pad, 5x9 2 x Per Week/15 Days Discharge Instructions: Apply over primary dressing as directed. Com pression Wrap: ThreePress (3 layer compression wrap) 2 x Per Week/15 Days Discharge Instructions: Apply LIGHTY three layer compression as directed. WOUND #11: - T Great Wound Laterality: Left oe Cleanser: Wound Cleanser 2 x Per Week/15 Days Discharge Instructions: Cleanse the wound with wound cleanser prior to applying a clean dressing using gauze sponges, not tissue or cotton balls. Cleanser: Soap and Water 2 x Per Week/15 Days Discharge Instructions: May shower and wash wound with dial antibacterial soap and water prior to dressing change. Prim Dressing: KerraCel Ag Gelling Fiber Dressing, 4x5 in (silver alginate) 2 x Per Week/15 Days ary Discharge Instructions: Apply silver alginate to wound bed as instructed Secondary Dressing: ABD  Pad, 5x9 2 x Per Week/15 Days Discharge Instructions: Apply over primary dressing as directed. Com pression Wrap: ThreePress (3 layer compression wrap) 2 x Per Week/15 Days Discharge Instructions: Apply LIGHTY three layer compression as directed. WOUND #2: - Lower Leg Wound Laterality: Right, Posterior Cleanser: Wound Cleanser 2 x Per Week/15 Days Discharge Instructions: Cleanse the wound with wound cleanser prior to applying a clean dressing using gauze sponges, not tissue or cotton balls. Cleanser: Soap and Water 2 x Per Week/15 Days Discharge Instructions: May shower and wash wound with dial antibacterial soap and water prior to dressing change. Prim Dressing: KerraCel Ag Gelling Fiber Dressing, 4x5 in (silver alginate) 2 x Per Week/15 Days ary Discharge Instructions: Apply silver alginate to wound bed as instructed Secondary Dressing: ABD Pad, 5x9 2 x Per Week/15 Days Discharge Instructions: Apply over primary dressing as directed. Com pression Wrap: ThreePress (3 layer compression wrap) 2 x Per Week/15 Days Discharge Instructions: Apply LIGHTY three layer compression as directed. WOUND #4: - Foot Wound Laterality: Dorsal, Left Cleanser: Wound Cleanser 2 x Per Week/15 Days Discharge Instructions: Cleanse the wound with wound cleanser prior to applying a clean dressing using gauze sponges, not tissue or cotton balls. Cleanser: Soap and Water 2 x Per Week/15 Days Discharge Instructions: May shower and wash wound with dial antibacterial soap and water prior to dressing change. Prim Dressing: KerraCel Ag Gelling Fiber Dressing, 4x5 in (silver alginate) 2 x Per Week/15 Days ary Discharge Instructions: Apply silver alginate to wound bed as instructed Secondary Dressing: ABD Pad, 5x9 2 x Per Week/15 Days Discharge Instructions: Apply over primary dressing as directed. Com pression Wrap: ThreePress (3  layer compression wrap) 2 x Per Week/15 Days Discharge Instructions: Apply LIGHTY three layer  compression as directed. WOUND #6: - Lower Leg Wound Laterality: Left, Posterior Cleanser: Wound Cleanser 2 x Per Week/15 Days Discharge Instructions: Cleanse the wound with wound cleanser prior to applying a clean dressing using gauze sponges, not tissue or cotton balls. Cleanser: Soap and Water 2 x Per Week/15 Days Discharge Instructions: May shower and wash wound with dial antibacterial soap and water prior to dressing change. Prim Dressing: KerraCel Ag Gelling Fiber Dressing, 4x5 in (silver alginate) 2 x Per Week/15 Days ary Discharge Instructions: Apply silver alginate to wound bed as instructed Secondary Dressing: ABD Pad, 5x9 2 x Per Week/15 Days Discharge Instructions: Apply over primary dressing as directed. Com pression Wrap: ThreePress (3 layer compression wrap) 2 x Per Week/15 Days Discharge Instructions: Apply LIGHTY three layer compression as directed. WOUND #9: - Upper Leg Wound Laterality: Left, Posterior, Proximal Cleanser: Wound Cleanser 2 x Per Week/15 Days Discharge Instructions: Cleanse the wound with wound cleanser prior to applying a clean dressing using gauze sponges, not tissue or cotton balls. Cleanser: Soap and Water 2 x Per Week/15 Days Discharge Instructions: May shower and wash wound with dial antibacterial soap and water prior to dressing change. Prim Dressing: KerraCel Ag Gelling Fiber Dressing, 4x5 in (silver alginate) 2 x Per Week/15 Days ary Discharge Instructions: Apply silver alginate to wound bed as instructed Secondary Dressing: ComfortFoam Border, 3x3 in (silicone border) 2 x Per Week/15 Days Discharge Instructions: Apply over primary dressing as directed. Com pression Wrap: ThreePress (3 layer compression wrap) 2 x Per Week/15 Days Discharge Instructions: Apply LIGHTY three layer compression as directed. 1. We continued with Aquacel Ag to all wound areas 2. We have been using a 3 layer "light" compression. 3 our staff have been working hard on  getting home health. If we are successful were probably going to have to change to kerlix Coban. He also is becoming increasingly immobile according to our staff. 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 Not sure if there is other issues here. 4. If we can get home health I be glad to sign off on his physical therapy orders 5. He has follow-up vascular review at the end of the month however I suspect he is going to need an angiogram not just to heal these wounds but to prevent further breakdown. He will be seeing Dr. Oneida Alar. Electronic Signature(s) Signed: 07/05/2020 5:46:09 PM By: Linton Ham MD Entered By: Linton Ham on 07/05/2020 10:33:38 -------------------------------------------------------------------------------- SuperBill Details Patient Name: Date of Service: [name], Brian RLES E. 07/05/2020 Medical Record Number: [id number] Patient Account Number: [id number] Date of Birth/Sex: Treating RN: 07/14/1940 (80 y.o. 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

## 2020-07-06 ENCOUNTER — Telehealth: Payer: Self-pay

## 2020-07-06 ENCOUNTER — Encounter (HOSPITAL_COMMUNITY): Payer: Self-pay | Admitting: Emergency Medicine

## 2020-07-06 ENCOUNTER — Other Ambulatory Visit: Payer: Self-pay

## 2020-07-06 ENCOUNTER — Ambulatory Visit (INDEPENDENT_AMBULATORY_CARE_PROVIDER_SITE_OTHER): Payer: Medicare Other | Admitting: Internal Medicine

## 2020-07-06 ENCOUNTER — Emergency Department (HOSPITAL_COMMUNITY)
Admission: EM | Admit: 2020-07-06 | Discharge: 2020-07-07 | Disposition: A | Discharge status: Home / Self Care | Payer: Medicare Other | Attending: Emergency Medicine | Admitting: Emergency Medicine

## 2020-07-06 ENCOUNTER — Encounter: Payer: Self-pay | Admitting: Internal Medicine

## 2020-07-06 VITALS — BP 132/70 | HR 53 | Temp 97.8°F | Resp 18 | Ht 70.0 in | Wt 180.0 lb

## 2020-07-06 DIAGNOSIS — N183 Chronic kidney disease, stage 3 unspecified: Secondary | ICD-10-CM | POA: Diagnosis not present

## 2020-07-06 DIAGNOSIS — Z87891 Personal history of nicotine dependence: Secondary | ICD-10-CM | POA: Insufficient documentation

## 2020-07-06 DIAGNOSIS — Z951 Presence of aortocoronary bypass graft: Secondary | ICD-10-CM | POA: Insufficient documentation

## 2020-07-06 DIAGNOSIS — J42 Unspecified chronic bronchitis: Secondary | ICD-10-CM

## 2020-07-06 DIAGNOSIS — J449 Chronic obstructive pulmonary disease, unspecified: Secondary | ICD-10-CM | POA: Insufficient documentation

## 2020-07-06 DIAGNOSIS — I2510 Atherosclerotic heart disease of native coronary artery without angina pectoris: Secondary | ICD-10-CM | POA: Insufficient documentation

## 2020-07-06 DIAGNOSIS — G8929 Other chronic pain: Secondary | ICD-10-CM | POA: Diagnosis not present

## 2020-07-06 DIAGNOSIS — Z96642 Presence of left artificial hip joint: Secondary | ICD-10-CM | POA: Insufficient documentation

## 2020-07-06 DIAGNOSIS — M549 Dorsalgia, unspecified: Secondary | ICD-10-CM | POA: Diagnosis not present

## 2020-07-06 DIAGNOSIS — I509 Heart failure, unspecified: Secondary | ICD-10-CM | POA: Diagnosis not present

## 2020-07-06 DIAGNOSIS — I5032 Chronic diastolic (congestive) heart failure: Secondary | ICD-10-CM | POA: Diagnosis not present

## 2020-07-06 DIAGNOSIS — Z79899 Other long term (current) drug therapy: Secondary | ICD-10-CM | POA: Diagnosis not present

## 2020-07-06 DIAGNOSIS — D649 Anemia, unspecified: Secondary | ICD-10-CM | POA: Diagnosis not present

## 2020-07-06 DIAGNOSIS — L03119 Cellulitis of unspecified part of limb: Secondary | ICD-10-CM | POA: Diagnosis not present

## 2020-07-06 DIAGNOSIS — Z85038 Personal history of other malignant neoplasm of large intestine: Secondary | ICD-10-CM | POA: Diagnosis not present

## 2020-07-06 DIAGNOSIS — I130 Hypertensive heart and chronic kidney disease with heart failure and stage 1 through stage 4 chronic kidney disease, or unspecified chronic kidney disease: Secondary | ICD-10-CM | POA: Diagnosis not present

## 2020-07-06 DIAGNOSIS — Z7901 Long term (current) use of anticoagulants: Secondary | ICD-10-CM | POA: Diagnosis not present

## 2020-07-06 DIAGNOSIS — I10 Essential (primary) hypertension: Secondary | ICD-10-CM | POA: Diagnosis not present

## 2020-07-06 LAB — COMPREHENSIVE METABOLIC PANEL
ALT: 14 U/L (ref 0–53)
AST: 21 U/L (ref 0–37)
Albumin: 3 g/dL — ABNORMAL LOW (ref 3.5–5.2)
Alkaline Phosphatase: 97 U/L (ref 39–117)
BUN: 38 mg/dL — ABNORMAL HIGH (ref 6–23)
CO2: 28 mEq/L (ref 19–32)
Calcium: 8.2 mg/dL — ABNORMAL LOW (ref 8.4–10.5)
Chloride: 98 mEq/L (ref 96–112)
Creatinine, Ser: 2.12 mg/dL — ABNORMAL HIGH (ref 0.40–1.50)
GFR: 29.43 mL/min — ABNORMAL LOW (ref >60.00)
Glucose, Bld: 126 mg/dL — ABNORMAL HIGH (ref 70–99)
Potassium: 2.9 mEq/L — ABNORMAL LOW (ref 3.5–5.1)
Sodium: 139 mEq/L (ref 135–145)
Total Bilirubin: 0.5 mg/dL (ref 0.2–1.2)
Total Protein: 6.4 g/dL (ref 6.0–8.3)

## 2020-07-06 LAB — POC OCCULT BLOOD, ED: Fecal Occult Bld: NEGATIVE

## 2020-07-06 LAB — CBC WITH DIFFERENTIAL/PLATELET
Abs Immature Granulocytes: 0.07 10*3/uL (ref 0.00–0.07)
Basophils Absolute: 0.1 10*3/uL (ref 0.0–0.1)
Basophils Relative: 1 %
Eosinophils Absolute: 0.2 10*3/uL (ref 0.0–0.5)
Eosinophils Relative: 3 %
HCT: 22.9 % — ABNORMAL LOW (ref 39.0–52.0)
Hemoglobin: 6.6 g/dL — CL (ref 13.0–17.0)
Immature Granulocytes: 1 %
Lymphocytes Relative: 12 %
Lymphs Abs: 1 10*3/uL (ref 0.7–4.0)
MCH: 23.8 pg — ABNORMAL LOW (ref 26.0–34.0)
MCHC: 28.8 g/dL — ABNORMAL LOW (ref 30.0–36.0)
MCV: 82.7 fL (ref 80.0–100.0)
Monocytes Absolute: 0.8 10*3/uL (ref 0.1–1.0)
Monocytes Relative: 9 %
Neutro Abs: 6.3 10*3/uL (ref 1.7–7.7)
Neutrophils Relative %: 74 %
Platelets: 246 10*3/uL (ref 150–400)
RBC: 2.77 MIL/uL — ABNORMAL LOW (ref 4.22–5.81)
RDW: 17 % — ABNORMAL HIGH (ref 11.5–15.5)
WBC: 8.4 10*3/uL (ref 4.0–10.5)
nRBC: 0 % (ref 0.0–0.2)

## 2020-07-06 LAB — CBC
HCT: 21.9 % — CL (ref 39.0–52.0)
Hemoglobin: 6.9 g/dL — CL (ref 13.0–17.0)
MCHC: 31.7 g/dL (ref 30.0–36.0)
MCV: 75.8 fl — ABNORMAL LOW (ref 78.0–100.0)
Platelets: 261 10*3/uL (ref 150.0–400.0)
RBC: 2.88 Mil/uL — ABNORMAL LOW (ref 4.22–5.81)
RDW: 18.7 % — ABNORMAL HIGH (ref 11.5–15.5)
WBC: 8.5 10*3/uL (ref 4.0–10.5)

## 2020-07-06 LAB — PROTIME-INR
INR: 1.6 — ABNORMAL HIGH (ref 0.8–1.2)
Prothrombin Time: 18.4 seconds — ABNORMAL HIGH (ref 11.4–15.2)

## 2020-07-06 LAB — BASIC METABOLIC PANEL
Anion gap: 12 (ref 5–15)
BUN: 37 mg/dL — ABNORMAL HIGH (ref 8–23)
CO2: 26 mmol/L (ref 22–32)
Calcium: 7.9 mg/dL — ABNORMAL LOW (ref 8.9–10.3)
Chloride: 98 mmol/L (ref 98–111)
Creatinine, Ser: 2.23 mg/dL — ABNORMAL HIGH (ref 0.61–1.24)
GFR, Estimated: 30 mL/min — ABNORMAL LOW (ref >60)
Glucose, Bld: 111 mg/dL — ABNORMAL HIGH (ref 70–99)
Potassium: 2.8 mmol/L — ABNORMAL LOW (ref 3.5–5.1)
Sodium: 136 mmol/L (ref 135–145)

## 2020-07-06 LAB — FERRITIN: Ferritin: 94.3 ng/mL (ref 22.0–322.0)

## 2020-07-06 LAB — APTT: aPTT: 37 seconds — ABNORMAL HIGH (ref 24–36)

## 2020-07-06 LAB — VITAMIN B12: Vitamin B-12: 1033 pg/mL — ABNORMAL HIGH (ref 211–911)

## 2020-07-06 LAB — PREPARE RBC (CROSSMATCH)

## 2020-07-06 MED ORDER — OXYCODONE HCL 5 MG PO TABS
15.0000 mg | ORAL_TABLET | Freq: Once | ORAL | Status: AC
  Administered 2020-07-06: 15 mg via ORAL
  Filled 2020-07-06: qty 3

## 2020-07-06 MED ORDER — POTASSIUM CHLORIDE 10 MEQ/100ML IV SOLN
10.0000 meq | Freq: Once | INTRAVENOUS | Status: AC
  Administered 2020-07-06: 10 meq via INTRAVENOUS
  Filled 2020-07-06: qty 100

## 2020-07-06 MED ORDER — SODIUM CHLORIDE 0.9% IV SOLUTION: Freq: Once | INTRAVENOUS | Status: DC

## 2020-07-06 MED ORDER — MORPHINE SULFATE (PF) 4 MG/ML IV SOLN
4.0000 mg | Freq: Once | INTRAVENOUS | Status: AC
Start: 2020-07-06 — End: 2020-07-06
  Administered 2020-07-06: 4 mg via INTRAVENOUS
  Filled 2020-07-06: qty 1

## 2020-07-06 MED ORDER — DULOXETINE HCL 30 MG PO CPEP: 30.0000 mg | ORAL_CAPSULE | Freq: Every day | ORAL | 3 refills | Status: DC

## 2020-07-06 MED ORDER — POTASSIUM CHLORIDE CRYS ER 20 MEQ PO TBCR
40.0000 meq | EXTENDED_RELEASE_TABLET | Freq: Once | ORAL | Status: AC
  Administered 2020-07-06: 40 meq via ORAL
  Filled 2020-07-06: qty 2

## 2020-07-06 MED ORDER — PANTOPRAZOLE SODIUM 20 MG PO TBEC: 20.0000 mg | DELAYED_RELEASE_TABLET | Freq: Two times a day (BID) | ORAL | 0 refills | Status: DC

## 2020-07-06 MED ORDER — SODIUM CHLORIDE 0.9 % IV SOLN
80.0000 mg | Freq: Once | INTRAVENOUS | Status: AC
  Administered 2020-07-06: 80 mg via INTRAVENOUS
  Filled 2020-07-06: qty 80

## 2020-07-06 NOTE — Progress Notes (Signed)
° °  Subjective:   Patient ID: [name], male    DOB: 06/16/1940, 80 y.o.   MRN: [id number]  HPI The patient is a 80 YO man coming in for follow up chronic pain (seeing pain clinic and having issues with consistency and people wanting to change his medicines all the time, is in severe pain all the time, denies constipation or diarrhea) and COPD (breathing stable, no flare today, overall good, minimal coughing) and CHF (overall stable breathing, chronic swelling in legs overall stable, no new chest pains but just hurts all over) and leg swelling (seeing wound center and they are improving, not as swollen but the fluid is moving up into his upper legs, denies fevers or chills). Never received scooter and needs hospital bed due to chronic pain.   The patient requires a hospital bed because he/she requires positioning of the body in ways not feasible with an ordinary bed or to relieve pain.  The patient requires a regular hospital bed.  A gel overlay is required as the patient has limited mobility AND the patient has an impaired nutritional status.  Review of Systems  Constitutional: Positive for activity change and fatigue. Negative for appetite change.  HENT: Negative.   Eyes: Negative.   Respiratory: Negative for cough, chest tightness and shortness of breath.   Cardiovascular: Positive for leg swelling. Negative for chest pain and palpitations.  Gastrointestinal: Negative for abdominal distention, abdominal pain, constipation, diarrhea, nausea and vomiting.  Musculoskeletal: Positive for arthralgias, back pain, gait problem and myalgias.  Skin: Negative.   Psychiatric/Behavioral: Negative.     Objective:  Physical Exam Constitutional:      Appearance: He is well-developed.  HENT:     Head: Normocephalic and atraumatic.  Cardiovascular:     Rate and Rhythm: Normal rate and regular rhythm.  Pulmonary:     Effort: Pulmonary effort is normal. No respiratory distress.     Breath  sounds: Normal breath sounds. No wheezing or rales.  Abdominal:     General: Bowel sounds are normal. There is no distension.     Palpations: Abdomen is soft.     Tenderness: There is no abdominal tenderness. There is no rebound.  Musculoskeletal:        General: Tenderness present.     Cervical back: Normal range of motion.     Right lower leg: Edema present.     Left lower leg: Edema present.     Comments: Edema bilaterally to the thighs, legs are wrapped today without oozing  Skin:    General: Skin is warm and dry.  Neurological:     Mental Status: He is alert and oriented to person, place, and time.     Coordination: Coordination abnormal.     Vitals:   07/06/20 0926  BP: 132/70  Pulse: (!) 53  Resp: 18  Temp: 97.8 F (36.6 C)  TempSrc: Oral  SpO2: 98%  Weight: 180 lb (81.6 kg)  Height: 5\' 10"  (1.778 m)   This visit occurred during the SARS-CoV-2 public health emergency.  Safety protocols were in place, including screening questions prior to the visit, additional usage of staff PPE, and extensive cleaning of exam room while observing appropriate contact time as indicated for disinfecting solutions.   Assessment & Plan:

## 2020-07-06 NOTE — ED Provider Notes (Signed)
[facility] EMERGENCY DEPARTMENT Provider Note   CSN: [id number] Arrival date & time: 07/06/20  1634     History No chief complaint on file.   [name] is a 79 y.o. male.  Patient is a 80 year old male with a history of CAD, CHF, COPD, hypertension, on Eliquis who presents with concerns for anemia.  Patient reports that he was at his primary care provider's office and they drew labs that showed his hemoglobin was low.  He was told to come here for a blood transfusion.  Patient denies any signs or symptoms of bleeding.  He denies any blood in the stool.  Patient does report that his stools have been darker than usual, however he does believe this is secondary to his iron supplementation that he takes.  Patient denies any abdominal pain.  He has not been having any chest pain or shortness of breath.  Patient does take chronic opioid medications and therefore he reports multiple aches and pains on exam related to his last dose being this morning.        Past Medical History:  Diagnosis Date  . AAA (abdominal aortic aneurysm) ([place])   . Blindness of left eye   . 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 CAD (coronary artery disease)    a. s/p CABG in 10/2008 with LIMA-LAD, SVG-OM1 with Y-graft to PL branch, and SVG-RCA  . Carotid stenosis   . Cerebrovascular disease   . CHF (congestive heart failure) ([place])   . Cirrhosis ([place])    on CT a/p 07/2013, no longer drinking  . CONGENITAL UNSPEC REDUCTION DEFORMITY LOWER LIMB   . Constipation due to opioid therapy 2014   began about a year ago  . COPD (chronic obstructive pulmonary disease) ([place])   . HTN (hypertension)   . Hyperlipidemia   . 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 Peripheral vascular disease ([place])   . TOBACCO USE, QUIT     Patient Active Problem List   Diagnosis Date Noted  . Cellulitis 05/20/2020  . Bilateral hand pain 11/03/2019  .  Anticoagulated 08/19/2019  . PAD (peripheral artery disease) ([place]) 07/27/2019  . Ulcer of right calf, limited to breakdown of skin ([place]) 07/21/2019  . Olecranon bursitis of left elbow 07/02/2018  . Failed total hip arthroplasty with dislocation, initial encounter ([place]) 06/22/2018  . Chronic diastolic heart failure ([place]) 03/16/2017  . CKD (chronic kidney disease) stage 3, GFR 30-59 ml/min ([facility]) 03/16/2017  . Opiate dependence ([place]) 02/13/2017  . Acute renal failure superimposed on stage 3 chronic kidney disease ([place]) 01/21/2017  . Chronic atrial fibrillation ([place]) 11/24/2014  . Aortic stenosis 03/24/2014  . Second degree AV block, Mobitz type I 09/06/2013  . Cirrhosis ([place]) 07/28/2013  . Bladder outlet obstruction 07/28/2013  . Impaired glucose metabolism   . Chronic back pain   . Benign neoplasm of colon 01/08/2011  . Abdominal aortic aneurysm ([place]) 08/12/2010  . TOBACCO USE, QUIT 04/11/2009  . Occlusion and stenosis of carotid artery 01/08/2009  . Hyperlipidemia 11/13/2008  . ANEMIA 11/13/2008  . Essential hypertension 11/13/2008  . Hx of CABG 11/13/2008  . COPD (chronic obstructive pulmonary disease) ([place]) 11/13/2008    Past Surgical History:  Procedure Laterality Date  . CORONARY ARTERY BYPASS GRAFT  11/03/2008   Ricard Dillon - x4: left internal mammary artery to the distal left anterior descending, saphemous vein graft to the first circumflex marginal branch with a Y graft sequentiallly to a  left posterolateral branch, saphenous vein graft to the distal right coronary artery  . ELECTROPHYSIOLOGIC STUDY N/A 01/18/2015   Procedure: A-Flutter Ablation;  Surgeon: Deboraha Sprang, MD;  Location: [facility];  Service: Cardiovascular;  Laterality: N/A;  . GREEN LIGHT LASER TURP (TRANSURETHRAL RESECTION OF PROSTATE N/A 02/14/2014   Procedure: GREEN LIGHT LASER TURP (TRANSURETHRAL RESECTION OF PROSTATE;  Surgeon: Festus Aloe, MD;  Location: [facility];  Service: Urology;  Laterality:  N/A;  . HIP ARTHROPLASTY Left 01/26/2018   Procedure: LEFT HIP POSTERIOR HEMIARTHROPLASTY;  Surgeon: [name], MD;  Location: [facility];  Service: Orthopedics;  Laterality: Left;  . HIP CLOSED REDUCTION Left 09/18/2018   Procedure: CLOSED MANIPULATION HIP;  Surgeon: Justice Britain, MD;  Location: [facility];  Service: Orthopedics;  Laterality: Left;  . HIP CLOSED REDUCTION Left 10/08/2018   Procedure: CLOSED MANIPULATION HIP;  Surgeon: Rod Can, MD;  Location: [facility];  Service: Orthopedics;  Laterality: Left;  . TOTAL HIP REVISION Left 06/24/2018   Procedure: ACETABULAR HIP REVISION POSTERIOR;  Surgeon: [name], MD;  Location: [facility];  Service: Orthopedics;  Laterality: Left;  . TOTAL HIP REVISION Left 10/21/2018   Procedure: POSTERIOR REVISION HIP WITH POSSIBLE CONSTRAINED LINER;  Surgeon: [name], MD;  Location: [facility];  Service: Orthopedics;  Laterality: Left;       Family History  Problem Relation Age of Onset  . Cirrhosis Mother        died at 63  . Heart attack Father   . Other Brother        GSW  . Other Brother        died in house fire  . Cancer Brother        unsure of type Believes colon or prostate/fim  . Colon cancer Neg Hx   . Stomach cancer Neg Hx     Social History   Tobacco Use  . Smoking status: Former Research scientist (life sciences)  . Smokeless tobacco: Never Used  Vaping Use  . Vaping Use: Never used  Substance Use Topics  . Alcohol use: No    Alcohol/week: 0.0 standard drinks    Comment: History of heavy alcohol use per pt. Quit many years ago1/1/ 2005  . Drug use: No    Home Medications Prior to Admission medications   Medication Sig Start Date End Date Taking? Authorizing Provider  pantoprazole (PROTONIX) 20 MG tablet Take 1 tablet (20 mg total) by mouth 2 (two) times daily. 07/06/20 10/04/20 Yes Kugler, [place], MD  albuterol (VENTOLIN HFA) 108 (90 Base) MCG/ACT inhaler TAKE 2 PUFFS BY MOUTH EVERY 6 HOURS AS NEEDED FOR WHEEZE OR SHORTNESS OF BREATH 06/06/20    [name], MD  allopurinol (ZYLOPRIM) 100 MG tablet  12/01/19   [provider]  b complex vitamins tablet Take 1 tablet by mouth daily.    [provider]  buprenorphine (BUTRANS) 7.5 MCG/HR Place 1 patch onto the skin once a week. 06/25/20   [provider]  Cholecalciferol (VITAMIN D3 PO) Take by mouth.    [provider]  DULoxetine (CYMBALTA) 30 MG capsule Take 1 capsule (30 mg total) by mouth daily. 07/06/20   [name], MD  ELIQUIS 5 MG TABS tablet TAKE 1 TABLET BY MOUTH TWICE A DAY 04/11/20   Lelon Perla, MD  famotidine (PEPCID) 40 MG tablet TAKE 1 TABLET BY MOUTH EVERY DAY 05/22/20   [name], MD  ferrous sulfate 325 (65 FE) MG tablet Take 325 mg  by mouth daily with breakfast.    [provider]  finasteride (PROSCAR) 5 MG tablet TAKE 1 TABLET BY MOUTH EVERY DAY 02/22/20   [name], MD  fluticasone [facility]) 50 MCG/ACT nasal spray PLACE 1 SPRAY INTO BOTH NOSTRILS EVERY MORNING. 10/25/19   [name], MD  furosemide (LASIX) 80 MG tablet TAKE 1 TABLET BY MOUTH EVERY DAY 05/16/20   [name], MD  Guaifenesin 1200 MG TB12 Take 1 tablet (1,200 mg total) by mouth 2 (two) times daily. Patient taking differently: Take 1,200 mg by mouth 2 (two) times daily as needed (mucus). 02/15/18   [name], MD  KLOR-CON M20 20 MEQ tablet TAKE 1 TABLET BY MOUTH EVERY DAY 03/19/20   Lelon Perla, MD  lactulose (CHRONULAC) 10 GM/15ML solution TAKE 30 MLS BY MOUTH 2 TIMES DAILY AS NEEDED FOR MILD CONSTIPATION OR MODERATE CONSTIPATION Patient taking differently: Take 20 g by mouth 2 (two) times daily as needed for mild constipation. 06/01/18   [name], MD  lidocaine (XYLOCAINE) 5 % ointment Apply 1 application topically as needed. 05/07/20   [name], MD  Multiple Vitamins-Minerals (ZINC PO) Take by mouth.    [provider]  Naloxone HCl (NARCAN NA)  Place 1 application into the nose once.    [provider]  nystatin ointment (MYCOSTATIN) APPLY TO AFFECTED AREA TWICE A DAY 04/25/19   [name], MD  oxyCODONE (ROXICODONE) 15 MG immediate release tablet Take 1 tablet (15 mg total) by mouth every 4 (four) hours as needed for pain. 11/02/19   [name], MD  predniSONE (DELTASONE) 10 MG tablet TAKE 2 TABLETS (20 MG TOTAL) BY MOUTH DAILY WITH BREAKFAST. 05/25/20   [name], MD  rosuvastatin (CRESTOR) 40 MG tablet Take 1 tablet (40 mg total) by mouth daily. 02/15/20 05/15/20  Lelon Perla, MD  spironolactone (ALDACTONE) 25 MG tablet TAKE 1 TABLET BY MOUTH EVERY DAY 01/13/20   [name], MD  sulfamethoxazole-trimethoprim (BACTRIM DS) 800-160 MG tablet Take 1 tablet by mouth 2 (two) times daily. 06/01/20   Marrian Salvage, FNP  SYMBICORT 160-4.5 MCG/ACT inhaler TAKE 2 PUFFS BY MOUTH TWICE A DAY 05/08/20   [name], MD  tamsulosin (FLOMAX) 0.4 MG CAPS capsule TAKE 1 CAPSULE BY MOUTH EVERY DAY 03/26/20   [name], MD  traZODone (DESYREL) 100 MG tablet TAKE 5 TABLETS BY MOUTH AT BEDTIME 09/15/19   [provider]    Allergies    Doxycycline, Gabapentin, Amlodipine, Fish allergy, Hydrocodone, Other, and Tylenol [acetaminophen]  Review of Systems   Review of Systems  Constitutional: Negative for chills and fever.  HENT: Negative for ear pain and sore throat.   Eyes: Negative for pain and visual disturbance.  Respiratory: Negative for cough and shortness of breath.   Cardiovascular: Negative for chest pain and palpitations.  Gastrointestinal: Negative for abdominal pain, diarrhea and vomiting.  Genitourinary: Negative for dysuria and hematuria.  Musculoskeletal: Negative for arthralgias and back pain.  Skin: Negative for color change and rash.  Neurological: Negative for dizziness and syncope.  All other systems reviewed and are negative.   Physical  Exam Updated Vital Signs BP 133/66 (BP Location: Right Arm)   Pulse (!) 118   Temp (!) 97.5 F (36.4 C) (Oral)   Resp 20   SpO2 98%   Physical Exam Vitals and nursing note reviewed.  Constitutional:      General: He is not  in acute distress.    Appearance: He is well-developed.  HENT:     Head: Normocephalic and atraumatic.     Right Ear: External ear normal.     Left Ear: External ear normal.     Mouth/Throat:     Mouth: Mucous membranes are moist.     Pharynx: Oropharynx is clear.  Eyes:     Extraocular Movements: Extraocular movements intact.     Conjunctiva/sclera: Conjunctivae normal.  Cardiovascular:     Rate and Rhythm: Normal rate and regular rhythm.     Heart sounds: No murmur heard.   Pulmonary:     Effort: Pulmonary effort is normal. No respiratory distress.     Breath sounds: Normal breath sounds.  Abdominal:     Palpations: Abdomen is soft.     Tenderness: There is no abdominal tenderness.  Genitourinary:    Rectum: Guaiac result negative.  Musculoskeletal:     Cervical back: Neck supple.     Right lower leg: No edema.     Left lower leg: No edema.  Skin:    General: Skin is warm and dry.  Neurological:     General: No focal deficit present.     Mental Status: He is alert and oriented to person, place, and time.     ED Results / Procedures / Treatments   Labs (all labs ordered are listed, but only abnormal results are displayed) Labs Reviewed  CBC WITH DIFFERENTIAL/PLATELET - Abnormal; Notable for the following components:      Result Value   RBC 2.77 (*)    Hemoglobin 6.6 (*)    HCT 22.9 (*)    MCH 23.8 (*)    MCHC 28.8 (*)    RDW 17.0 (*)    All other components within normal limits  BASIC METABOLIC PANEL - Abnormal; Notable for the following components:   Potassium 2.8 (*)    Glucose, Bld 111 (*)    BUN 37 (*)    Creatinine, Ser 2.23 (*)    Calcium 7.9 (*)    GFR, Estimated 30 (*)    All other components within normal limits   PROTIME-INR  APTT  POC OCCULT BLOOD, ED  TYPE AND SCREEN  PREPARE RBC (CROSSMATCH)    EKG EKG Interpretation  Date/Time:  Friday July 06 2020 16:43:56 EDT Ventricular Rate:  67 PR Interval:    QRS Duration: 102 QT Interval:  440 QTC Calculation: 464 R Axis:   3 Text Interpretation: Atrial fibrillation with premature ventricular or aberrantly conducted complexes Minimal voltage criteria for LVH, may be normal variant ( R in aVL ) Nonspecific ST abnormality Abnormal ECG No significant change since last tracing Confirmed by Wandra Arthurs [phone]) on 07/06/2020 8:35:50 PM   Radiology No results found.  Procedures Procedures   Medications Ordered in ED Medications  0.9 %  sodium chloride infusion (Manually program via Guardrails IV Fluids) (has no administration in time range)  potassium chloride 10 mEq in 100 mL IVPB (10 mEq Intravenous New Bag/Given 07/06/20 2233)  morphine 4 MG/ML injection 4 mg (has no administration in time range)  potassium chloride SA (KLOR-CON) CR tablet 40 mEq (40 mEq Oral Given 07/06/20 2158)  pantoprazole (PROTONIX) 80 mg in sodium chloride 0.9 % 100 mL IVPB (80 mg Intravenous New Bag/Given 07/06/20 2230)  oxyCODONE (Oxy IR/ROXICODONE) immediate release tablet 15 mg (15 mg Oral Given 07/06/20 2158)    ED Course  I have reviewed the triage vital signs and the nursing notes.  Pertinent labs & imaging results that were available during my care of the patient were reviewed by me and considered in my medical decision making (see chart for details).    MDM Rules/Calculators/A&P                          Patient with history of dark stools over the last few months, however guaiac negative on rectal exam today.  Arrives hemodynamically stable.  Normal blood pressure.  Heart rate appropriate.  Saturating 98% on room air.  Patient alert awake and oriented.  Denies dizziness and denies weakness.  Patient's hemoglobin 6.6 as noted above.  Patient consented for  blood and transfused 1 unit.  Mildly increased creatinine likely secondary to decreased hemoglobin.   Given guaiac negative stool and hemodynamic stability, feel the patient would not benefit from admission for scope at this time.  He is stable enough to go home and follow-up with GI in the morning.  Possible that patient's anemia secondary to GI bleed, however does not require emergent scope at this time.  Discussed this case with GI physician on call and hospitalist.  Assuming patient's hemodynamics remain stable, patient will receive his unit of blood and then be discharged home.  Patient will stop his Eliquis until he is able to follow-up with GI in the next few days.  Additionally, patient will start Protonix twice daily for treatment of upper GI bleed.  Final Clinical Impression(s) / ED Diagnoses Final diagnoses:  Anemia, unspecified type    Rx / DC Orders ED Discharge Orders         Ordered    pantoprazole (PROTONIX) 20 MG tablet  2 times daily        07/06/20 2305           Kugler, [place], MD 07/06/20 2335    Drenda Freeze, MD 07/08/20 [phone]

## 2020-07-06 NOTE — Discharge Instructions (Signed)
-   Start taking protonix as prescribed - Stop taking eliquis until you are able to follow up with GI doctor - Call for an appointment tomorrow morning

## 2020-07-06 NOTE — ED Triage Notes (Signed)
Pt. Stated, I went to Dr. This am  And called and said my HGB.  And to come here. Pt. Denies any symptoms except for pain.

## 2020-07-06 NOTE — Telephone Encounter (Signed)
Can we call and advise him to go to ER for low blood counts given that his levels are significantly lower than our most recent and we do not have results from [place] to compare and he will likely need blood transfusion.

## 2020-07-06 NOTE — ED Notes (Signed)
Pt has a history of cerebral palsy. Is wearing bilateral unna boots to lower extremities. Dressing present to right knee. C/O generalized weakness and fatigue.

## 2020-07-06 NOTE — Assessment & Plan Note (Signed)
Appears stable and seeing wound clinic currently wrapped.

## 2020-07-06 NOTE — ED Notes (Signed)
Blood bank keeps trying to reach the rn for a unit of blood is ready for pt

## 2020-07-06 NOTE — Assessment & Plan Note (Signed)
Overall stable, needs hospital bed due to need to reposition frequently and head of bed 30 degrees.

## 2020-07-06 NOTE — Assessment & Plan Note (Signed)
Rx duloxetine to help. He is seeing pain management and he is continuing to see them with his chronic oxycodone and recent addition of butrans patch which does not seem to help him much yet.

## 2020-07-06 NOTE — Patient Instructions (Addendum)
We will fax the letter for the scooter again and the hospital bed.   We will check the labs today to check the iron levels.   We have sent in duloxetine to take daily to help the pain medicine work better.

## 2020-07-06 NOTE — Telephone Encounter (Signed)
Pt notified of Dr Nathanial Millman note & verb understanding.  States he will go to ER once his son gets home from his own MD appt.

## 2020-07-06 NOTE — Assessment & Plan Note (Signed)
Breathing stable and using albuterol prn. Taking mucinex for mucus. No flare today.

## 2020-07-06 NOTE — Telephone Encounter (Signed)
Hope from the lab called to report critical values:  Hemoglobin: 6.9 Hematocrit:  21.9  (Lab results given to Dr. Sharlet Salina today)

## 2020-07-07 LAB — TYPE AND SCREEN
ABO/RH(D): A NEG
Antibody Screen: NEGATIVE
Unit division: 0

## 2020-07-07 LAB — BPAM RBC
Blood Product Expiration Date: 202204122359
ISSUE DATE / TIME: 202203182347
Unit Type and Rh: 600

## 2020-07-07 NOTE — ED Notes (Signed)
Pt spilled urinal in bed, pt cleaned and new linens provided.

## 2020-07-08 ENCOUNTER — Other Ambulatory Visit: Payer: Self-pay | Admitting: Internal Medicine

## 2020-07-10 ENCOUNTER — Inpatient Hospital Stay: Payer: Medicare Other

## 2020-07-10 ENCOUNTER — Encounter: Payer: Self-pay | Admitting: Physician Assistant

## 2020-07-10 ENCOUNTER — Inpatient Hospital Stay: Payer: Medicare Other | Attending: Physician Assistant | Admitting: Physician Assistant

## 2020-07-10 ENCOUNTER — Other Ambulatory Visit: Payer: Self-pay

## 2020-07-10 VITALS — BP 115/54 | HR 58 | Temp 96.2°F | Resp 14 | Ht 70.0 in

## 2020-07-10 DIAGNOSIS — D509 Iron deficiency anemia, unspecified: Secondary | ICD-10-CM | POA: Insufficient documentation

## 2020-07-10 DIAGNOSIS — J449 Chronic obstructive pulmonary disease, unspecified: Secondary | ICD-10-CM | POA: Diagnosis not present

## 2020-07-10 DIAGNOSIS — Z87891 Personal history of nicotine dependence: Secondary | ICD-10-CM | POA: Diagnosis not present

## 2020-07-10 DIAGNOSIS — I2510 Atherosclerotic heart disease of native coronary artery without angina pectoris: Secondary | ICD-10-CM | POA: Insufficient documentation

## 2020-07-10 DIAGNOSIS — N183 Chronic kidney disease, stage 3 unspecified: Secondary | ICD-10-CM | POA: Diagnosis not present

## 2020-07-10 DIAGNOSIS — I130 Hypertensive heart and chronic kidney disease with heart failure and stage 1 through stage 4 chronic kidney disease, or unspecified chronic kidney disease: Secondary | ICD-10-CM | POA: Insufficient documentation

## 2020-07-10 DIAGNOSIS — I4891 Unspecified atrial fibrillation: Secondary | ICD-10-CM | POA: Diagnosis not present

## 2020-07-10 DIAGNOSIS — I509 Heart failure, unspecified: Secondary | ICD-10-CM | POA: Diagnosis not present

## 2020-07-10 DIAGNOSIS — K921 Melena: Secondary | ICD-10-CM | POA: Insufficient documentation

## 2020-07-10 DIAGNOSIS — M7989 Other specified soft tissue disorders: Secondary | ICD-10-CM | POA: Diagnosis not present

## 2020-07-10 LAB — CBC WITH DIFFERENTIAL (CANCER CENTER ONLY)
Abs Immature Granulocytes: 0.07 10*3/uL (ref 0.00–0.07)
Basophils Absolute: 0 10*3/uL (ref 0.0–0.1)
Basophils Relative: 0 %
Eosinophils Absolute: 0 10*3/uL (ref 0.0–0.5)
Eosinophils Relative: 0 %
HCT: 29 % — ABNORMAL LOW (ref 39.0–52.0)
Hemoglobin: 8.9 g/dL — ABNORMAL LOW (ref 13.0–17.0)
Immature Granulocytes: 1 %
Lymphocytes Relative: 10 %
Lymphs Abs: 1.2 10*3/uL (ref 0.7–4.0)
MCH: 24.5 pg — ABNORMAL LOW (ref 26.0–34.0)
MCHC: 30.7 g/dL (ref 30.0–36.0)
MCV: 79.9 fL — ABNORMAL LOW (ref 80.0–100.0)
Monocytes Absolute: 0.7 10*3/uL (ref 0.1–1.0)
Monocytes Relative: 6 %
Neutro Abs: 9.6 10*3/uL — ABNORMAL HIGH (ref 1.7–7.7)
Neutrophils Relative %: 83 %
Platelet Count: 204 10*3/uL (ref 150–400)
RBC: 3.63 MIL/uL — ABNORMAL LOW (ref 4.22–5.81)
RDW: 17.1 % — ABNORMAL HIGH (ref 11.5–15.5)
WBC Count: 11.5 10*3/uL — ABNORMAL HIGH (ref 4.0–10.5)
nRBC: 0 % (ref 0.0–0.2)

## 2020-07-10 LAB — CMP (CANCER CENTER ONLY)
ALT: 15 U/L (ref 0–44)
AST: 17 U/L (ref 15–41)
Albumin: 2.5 g/dL — ABNORMAL LOW (ref 3.5–5.0)
Alkaline Phosphatase: 103 U/L (ref 38–126)
Anion gap: 11 (ref 5–15)
BUN: 37 mg/dL — ABNORMAL HIGH (ref 8–23)
CO2: 24 mmol/L (ref 22–32)
Calcium: 8.2 mg/dL — ABNORMAL LOW (ref 8.9–10.3)
Chloride: 101 mmol/L (ref 98–111)
Creatinine: 2.08 mg/dL — ABNORMAL HIGH (ref 0.61–1.24)
GFR, Estimated: 32 mL/min — ABNORMAL LOW (ref >60)
Glucose, Bld: 156 mg/dL — ABNORMAL HIGH (ref 70–99)
Potassium: 3.3 mmol/L — ABNORMAL LOW (ref 3.5–5.1)
Sodium: 136 mmol/L (ref 135–145)
Total Bilirubin: 0.5 mg/dL (ref 0.3–1.2)
Total Protein: 6.6 g/dL (ref 6.5–8.1)

## 2020-07-10 LAB — RETIC PANEL
Immature Retic Fract: 25.2 % — ABNORMAL HIGH (ref 2.3–15.9)
RBC.: 3.59 MIL/uL — ABNORMAL LOW (ref 4.22–5.81)
Retic Count, Absolute: 69.6 10*3/uL (ref 19.0–186.0)
Retic Ct Pct: 1.9 % (ref 0.4–3.1)
Reticulocyte Hemoglobin: 20.5 pg — ABNORMAL LOW (ref >27.9)

## 2020-07-10 LAB — FOLATE: Folate: 14.8 ng/mL (ref >5.9)

## 2020-07-10 LAB — SAMPLE TO BLOOD BANK

## 2020-07-10 NOTE — Progress Notes (Signed)
[place] Telephone:(336) [phone]   Fax:([phone]) [place] NOTE  Patient Care Team: [name], MD as PCP - General (Internal Medicine) Stanford Breed, Denice Bors, MD as PCP - Cardiology (Cardiology) Stanford Breed Denice Bors, MD as Consulting Physician (Cardiology) Rexene Alberts, MD as Consulting Physician (Cardiothoracic Surgery) Rolan Bucco, MD (Urology) Brandy Hale, MD as Consulting Physician (Anesthesiology) Charlton Haws, [facility] as Pharmacist (Pharmacist)  Hematological/Oncological History 1) 11/10/2019:Labs from [facility] 15.4 (H), Hgb 10.2 (L), MCV 84, Plt 314K, BUN 53 (H), Creatinine 1.7 (H) 2) 02/13/2020: WBC 7.8, Hgb 12.4 (L), MCV 85, Plt 222K, Creatinine 1.75. 3) 07/06/2020: ED visit due to worsening anemia. WBC 8.5, Hgb 6.9, MCV 75.8, Plt 261K, Ferritin 94.3, Vitamin B12 1,033, Creatinine 2.12, Potassium 2.9.  Stool guaiac negative. Received 1 unit of pRBC.  4) 07/10/2020: Establish care with Dede Query PA-C  CHIEF COMPLAINTS/PURPOSE OF CONSULTATION:  "Anemia"  HISTORY OF PRESENTING ILLNESS:  [name] 80 y.o. male with medical history significant for CAD, CHF, HTN, atrial fibrillation, COPD, CKD Stage 3 and cirrhosis who presents to the clinic for evaluation for worsening microcytic anemia. He is unaccompanied for this visit. He reports fatigue that has gradually worsened over the last several months. He no longer can ambulate on his own, now requires a wheelchair. He currently lives with his son and wife. Patient needs assistance to complete certain ADLs including bathing. He has a good appetite and denies any dietary restrictions or recent weight changes. Patient denies any nausea, vomiting or abdominal pain. He has a bowel movement every 2-3 days without any diarrhea or constipation. He reports black stools for several weeks that he assumes is from taking oral iron daily. He started taking oral iron several months  ago on his own without diagnosis of iron deficiency. Patient has diffuse arthritic pain involving his back, upper and lower extremities. He currently takes oxycodone 15 mg every 4 hours and ibuprofen several times a day (up to 6 times per day). Patient has chronic lower extremity swelling and cellulitis that is managed by wound care. Patient reports occasional episodes of nose bleeds and does have bruising on his arms. He admits to not taking his dose of eliquis on days he has nosebleeds.Patient reports chronic shortness of breath with exertion but none at rest.  Patient denies any fevers, chills, night sweats, chest pain or cough. He has no other complaints. Rest of the 10 point ROS is below.   MEDICAL HISTORY:  Past Medical History:  Diagnosis Date  . AAA (abdominal aortic aneurysm) ([place])   . Blindness of left eye   . 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 CAD (coronary artery disease)    a. s/p CABG in 10/2008 with LIMA-LAD, SVG-OM1 with Y-graft to PL branch, and SVG-RCA  . Carotid stenosis   . Cerebrovascular disease   . CHF (congestive heart failure) ([place])   . Cirrhosis ([place])    on CT a/p 07/2013, no longer drinking  . CONGENITAL UNSPEC REDUCTION DEFORMITY LOWER LIMB   . Constipation due to opioid therapy 2014   began about a year ago  . COPD (chronic obstructive pulmonary disease) ([place])   . HTN (hypertension)   . Hyperlipidemia   . 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 Peripheral vascular disease ([place])   . TOBACCO USE, QUIT     SURGICAL HISTORY: Past Surgical History:  Procedure Laterality Date  . CORONARY ARTERY BYPASS GRAFT  11/03/2008   Ricard Dillon - x4: left internal mammary artery to the distal left anterior descending, saphemous vein graft to the first circumflex marginal branch with a Y graft sequentiallly to a left posterolateral branch, saphenous vein graft to the distal right coronary artery  . ELECTROPHYSIOLOGIC STUDY  N/A 01/18/2015   Procedure: A-Flutter Ablation;  Surgeon: Deboraha Sprang, MD;  Location: [facility];  Service: Cardiovascular;  Laterality: N/A;  . GREEN LIGHT LASER TURP (TRANSURETHRAL RESECTION OF PROSTATE N/A 02/14/2014   Procedure: GREEN LIGHT LASER TURP (TRANSURETHRAL RESECTION OF PROSTATE;  Surgeon: Festus Aloe, MD;  Location: [facility];  Service: Urology;  Laterality: N/A;  . HIP ARTHROPLASTY Left 01/26/2018   Procedure: LEFT HIP POSTERIOR HEMIARTHROPLASTY;  Surgeon: [name], MD;  Location: [facility];  Service: Orthopedics;  Laterality: Left;  . HIP CLOSED REDUCTION Left 09/18/2018   Procedure: CLOSED MANIPULATION HIP;  Surgeon: Justice Britain, MD;  Location: [facility];  Service: Orthopedics;  Laterality: Left;  . HIP CLOSED REDUCTION Left 10/08/2018   Procedure: CLOSED MANIPULATION HIP;  Surgeon: Rod Can, MD;  Location: [facility];  Service: Orthopedics;  Laterality: Left;  . TOTAL HIP REVISION Left 06/24/2018   Procedure: ACETABULAR HIP REVISION POSTERIOR;  Surgeon: [name], MD;  Location: [facility];  Service: Orthopedics;  Laterality: Left;  . TOTAL HIP REVISION Left 10/21/2018   Procedure: POSTERIOR REVISION HIP WITH POSSIBLE CONSTRAINED LINER;  Surgeon: [name], MD;  Location: [facility];  Service: Orthopedics;  Laterality: Left;    SOCIAL HISTORY: Social History   Socioeconomic History  . Marital status: Widowed    Spouse name: Not on file  . Number of children: 2  . Years of education: 29  . Highest education level: High school graduate  Occupational History  . Occupation: Retired  Tobacco Use  . Smoking status: Former Research scientist (life sciences)  . Smokeless tobacco: Never Used  Vaping Use  . Vaping Use: Never used  Substance and Sexual Activity  . Alcohol use: No    Alcohol/week: 0.0 standard drinks    Comment: History of heavy alcohol use per pt. Quit many years ago1/1/ 2005  . Drug use: No  . Sexual activity: Not Currently  Other Topics Concern  . Not on file  Social  History Narrative   He lives alone here in [place].  He has 1 son, who lives in the [place],    Social Determinants of Health   Financial Resource Strain: Not on file  Food Insecurity: Not on file  Transportation Needs: Not on file  Physical Activity: Not on file  Stress: Not on file  Social Connections: Not on file  Intimate Partner Violence: Not on file    FAMILY HISTORY: Family History  Problem Relation Age of Onset  . Cirrhosis Mother        died at 30  . Heart attack Father   . Other Brother        GSW  . Other Brother        died in house fire  . Cancer Brother        unsure of type Believes colon or prostate/fim  . Colon cancer Neg Hx   . Stomach cancer Neg Hx     ALLERGIES:  is allergic to doxycycline, gabapentin, amlodipine, fish allergy, hydrocodone, other, and tylenol [acetaminophen].  MEDICATIONS:  Current Outpatient Medications  Medication Sig Dispense Refill  . albuterol (VENTOLIN HFA) 108 (90 Base) MCG/ACT inhaler TAKE 2 PUFFS BY MOUTH EVERY 6 HOURS AS  NEEDED FOR WHEEZE OR SHORTNESS OF BREATH 18 each 2  . b complex vitamins tablet Take 1 tablet by mouth daily.    . buprenorphine (BUTRANS) 7.5 MCG/HR Place 1 patch onto the skin once a week.    . Cholecalciferol (VITAMIN D3 PO) Take by mouth.    . DULoxetine (CYMBALTA) 30 MG capsule Take 1 capsule (30 mg total) by mouth daily. 30 capsule 3  . ELIQUIS 5 MG TABS tablet TAKE 1 TABLET BY MOUTH TWICE A DAY 180 tablet 3  . ferrous sulfate 325 (65 FE) MG tablet Take 325 mg by mouth daily with breakfast.    . finasteride (PROSCAR) 5 MG tablet TAKE 1 TABLET BY MOUTH EVERY DAY 90 tablet 1  . fluticasone (FLONASE) 50 MCG/ACT nasal spray PLACE 1 SPRAY INTO BOTH NOSTRILS EVERY MORNING. 48 mL 1  . furosemide (LASIX) 80 MG tablet TAKE 1 TABLET BY MOUTH EVERY DAY 90 tablet 1  . Guaifenesin 1200 MG TB12 Take 1 tablet (1,200 mg total) by mouth 2 (two) times daily. (Patient taking differently: Take 1,200 mg by mouth 2  (two) times daily as needed (mucus).) 180 each 3  . KLOR-CON M20 20 MEQ tablet TAKE 1 TABLET BY MOUTH EVERY DAY 90 tablet 1  . lactulose (CHRONULAC) 10 GM/15ML solution TAKE 30 MLS BY MOUTH 2 TIMES DAILY AS NEEDED FOR MILD CONSTIPATION OR MODERATE CONSTIPATION (Patient taking differently: Take 20 g by mouth 2 (two) times daily as needed for mild constipation.) 1892 mL 6  . lidocaine (XYLOCAINE) 5 % ointment Apply 1 application topically as needed. 240 g 2  . Multiple Vitamins-Minerals (ZINC PO) Take by mouth.    . Naloxone HCl (NARCAN NA) Place 1 application into the nose once.    . nystatin ointment (MYCOSTATIN) APPLY TO AFFECTED AREA TWICE A DAY 30 g 3  . oxyCODONE (ROXICODONE) 15 MG immediate release tablet Take 1 tablet (15 mg total) by mouth every 4 (four) hours as needed for pain. 150 tablet 0  . pantoprazole (PROTONIX) 20 MG tablet Take 1 tablet (20 mg total) by mouth 2 (two) times daily. 180 tablet 0  . predniSONE (DELTASONE) 10 MG tablet TAKE 2 TABLETS (20 MG TOTAL) BY MOUTH DAILY WITH BREAKFAST. 60 tablet 2  . spironolactone (ALDACTONE) 25 MG tablet TAKE 1 TABLET BY MOUTH EVERY DAY 90 tablet 1  . sulfamethoxazole-trimethoprim (BACTRIM DS) 800-160 MG tablet Take 1 tablet by mouth 2 (two) times daily. 14 tablet 0  . SYMBICORT 160-4.5 MCG/ACT inhaler TAKE 2 PUFFS BY MOUTH TWICE A DAY 10.2 each 3  . tamsulosin (FLOMAX) 0.4 MG CAPS capsule TAKE 1 CAPSULE BY MOUTH EVERY DAY 90 capsule 1  . traZODone (DESYREL) 100 MG tablet TAKE 5 TABLETS BY MOUTH AT BEDTIME    . rosuvastatin (CRESTOR) 40 MG tablet Take 1 tablet (40 mg total) by mouth daily. 90 tablet 3   No current facility-administered medications for this visit.    REVIEW OF SYSTEMS:   Constitutional: ( - ) fevers, ( - )  chills , ( - ) night sweats Eyes: ( - ) blurriness of vision, ( - ) double vision, ( - ) watery eyes Ears, nose, mouth, throat, and face: ( - ) mucositis, ( - ) sore throat Respiratory: ( - ) cough, ( + ) dyspnea, ( -  ) wheezes Cardiovascular: ( - ) palpitation, ( - ) chest discomfort, ( + ) lower extremity swelling Gastrointestinal:  ( - ) nausea, ( - ) heartburn, ( - )  change in bowel habits Skin: ( - ) abnormal skin rashes Lymphatics: ( - ) new lymphadenopathy, ( + ) easy bruising Neurological: ( - ) numbness, ( - ) tingling, ( - ) new weaknesses Behavioral/Psych: ( - ) mood change, ( - ) new changes  All other systems were reviewed with the patient and are negative.  PHYSICAL EXAMINATION: ECOG PERFORMANCE STATUS: 2 - Symptomatic, <50% confined to bed  Vitals:   07/10/20 1256  BP: (!) 115/54  Pulse: (!) 58  Resp: 14  Temp: (!) 96.2 F (35.7 C)  SpO2: 100%   Filed Weights    GENERAL: male in not acute distress. In a wheelchair during visit.   SKIN: skin color, texture, turgor are normal, no rashes or significant lesions. Diffuse ecchymosis of bilateral upper extremities.  EYES: conjunctiva are pink and non-injected, sclera clear OROPHARYNX: no exudate, no erythema; lips, buccal mucosa, and tongue normal  NECK: supple, non-tender LYMPH:  no palpable lymphadenopathy in the cervical, axillary or supraclavicular lymph nodes.  LUNGS: clear to auscultation and percussion with normal breathing effort HEART: regular rate & rhythm and no murmurs. Lower extremity edema bilaterally to thigh, legs are wrapped without oozing.  ABDOMEN: soft, non-tender, non-distended, normal bowel sounds Musculoskeletal: no cyanosis of digits and no clubbing  PSYCH: alert & oriented x 3, fluent speech  LABORATORY DATA:  I have reviewed the data as listed CBC Latest Ref Rng & Units 07/10/2020 07/06/2020 07/06/2020  WBC 4.0 - 10.5 K/uL 11.5(H) 8.4 8.5  Hemoglobin 13.0 - 17.0 g/dL 8.9(L) 6.6(LL) 6.9 Repeated and verified X2.(LL)  Hematocrit 39.0 - 52.0 % 29.0(L) 22.9(L) 21.9 Repeated and verified X2.(LL)  Platelets 150 - 400 K/uL 204 246 261.0    CMP Latest Ref Rng & Units 07/10/2020 07/06/2020 07/06/2020  Glucose 70 -  99 mg/dL 156(H) 111(H) 126(H)  BUN 8 - 23 mg/dL 37(H) 37(H) 38(H)  Creatinine 0.61 - 1.24 mg/dL 2.08(H) 2.23(H) 2.12(H)  Sodium 135 - 145 mmol/L 136 136 139  Potassium 3.5 - 5.1 mmol/L 3.3(L) 2.8(L) 2.9(L)  Chloride 98 - 111 mmol/L 101 98 98  CO2 22 - 32 mmol/L 24 26 28   Calcium 8.9 - 10.3 mg/dL 8.2(L) 7.9(L) 8.2(L)  Total Protein 6.5 - 8.1 g/dL 6.6 - 6.4  Total Bilirubin 0.3 - 1.2 mg/dL 0.5 - 0.5  Alkaline Phos 38 - 126 U/L 103 - 97  AST 15 - 41 U/L 17 - 21  ALT 0 - 44 U/L 15 - 14    ASSESSMENT & PLAN [name] is a 80 y.o. male presenting to the clinic for evaluation for microcytic anemia.   #Iron deficiency anemia: --Concern is there for GI bleed since patient reports black stools. Need referral to GI for EGD and colonoscopy. --Patient has cirrhosis so risk is there for bleeding varices.  --Advised patient to discontinue ibuprofen/NSAIDS and follow up with PCP to discuss further pain management.  --Labs today including CBC, CMP, iron and TIBC, retic panel, folate, erythropoietin and SPEP.  --Due to failure for PO iron to improve hemoglobin, we will arrange for IV feraheme x 2.  --RTC 2 weeks after completion of IV iron infusion.   Orders Placed This Encounter  Procedures  . CBC with Differential (Cancer Center Only)    Standing Status:   Future    Number of Occurrences:   1    Standing Expiration Date:   07/10/2021  . Retic Panel    Standing Status:   Future    Number  of Occurrences:   1    Standing Expiration Date:   07/10/2021  . Iron and TIBC    Standing Status:   Future    Number of Occurrences:   1    Standing Expiration Date:   07/10/2021  . Folate, Serum    Standing Status:   Future    Number of Occurrences:   1    Standing Expiration Date:   07/10/2021  . Erythropoietin    Standing Status:   Future    Number of Occurrences:   1    Standing Expiration Date:   07/10/2021  . SPEP (Serum protein electrophoresis)    Standing Status:   Future    Number of  Occurrences:   1    Standing Expiration Date:   07/10/2021  . CMP ([place] only)    Standing Status:   Future    Number of Occurrences:   1    Standing Expiration Date:   07/10/2021  . Ambulatory referral to Gastroenterology    Referral Priority:   Urgent    Referral Type:   Consultation    Referral Reason:   Specialty Services Required    Number of Visits Requested:   1  . Sample to Blood Bank    Standing Status:   Future    Number of Occurrences:   1    Standing Expiration Date:   07/10/2021    All questions were answered. The patient knows to call the clinic with any problems, questions or concerns.  A total of 60 minutes was spent on this encounter and over half of that time was spent on counseling and coordination of care as outlined above.    Dede Query, PA-C Department of Hematology/Oncology [place] at [facility] Phone: [phone]

## 2020-07-11 ENCOUNTER — Telehealth: Payer: Self-pay

## 2020-07-11 DIAGNOSIS — D509 Iron deficiency anemia, unspecified: Secondary | ICD-10-CM | POA: Insufficient documentation

## 2020-07-11 LAB — IRON AND TIBC
Iron: 17 ug/dL — ABNORMAL LOW (ref 42–163)
Saturation Ratios: 7 % — ABNORMAL LOW (ref 20–55)
TIBC: 231 ug/dL (ref 202–409)
UIBC: 215 ug/dL (ref 117–376)

## 2020-07-11 LAB — ERYTHROPOIETIN: Erythropoietin: 19 m[IU]/mL — ABNORMAL HIGH (ref 2.6–18.5)

## 2020-07-11 NOTE — Telephone Encounter (Signed)
Call received from [facility]. She states she was returning a call from Dede Query, [place].   Per Terisa Starr, she wanted to share with them that pt is being seen for anemia, likely due to a GI bleed as pt takes a lot of NSAIDS for pain. She has advised the pt not to take NSAIDS anymore, therefore pt needs to revisit the plan for pain manganement/control without the use of NSAIDS.  I have relayed this information via a message left with [facility] call center. We have also instructed the pt to also call them regarding this direction as well.

## 2020-07-12 ENCOUNTER — Telehealth: Payer: Self-pay | Admitting: Physician Assistant

## 2020-07-12 ENCOUNTER — Other Ambulatory Visit: Payer: Self-pay

## 2020-07-12 ENCOUNTER — Encounter (HOSPITAL_BASED_OUTPATIENT_CLINIC_OR_DEPARTMENT_OTHER): Payer: Medicare Other | Admitting: Internal Medicine

## 2020-07-12 DIAGNOSIS — I509 Heart failure, unspecified: Secondary | ICD-10-CM | POA: Diagnosis not present

## 2020-07-12 DIAGNOSIS — I87333 Chronic venous hypertension (idiopathic) with ulcer and inflammation of bilateral lower extremity: Secondary | ICD-10-CM | POA: Diagnosis not present

## 2020-07-12 DIAGNOSIS — L97228 Non-pressure chronic ulcer of left calf with other specified severity: Secondary | ICD-10-CM | POA: Diagnosis not present

## 2020-07-12 DIAGNOSIS — I890 Lymphedema, not elsewhere classified: Secondary | ICD-10-CM | POA: Diagnosis not present

## 2020-07-12 DIAGNOSIS — L97528 Non-pressure chronic ulcer of other part of left foot with other specified severity: Secondary | ICD-10-CM | POA: Diagnosis not present

## 2020-07-12 DIAGNOSIS — L97818 Non-pressure chronic ulcer of other part of right lower leg with other specified severity: Secondary | ICD-10-CM | POA: Diagnosis not present

## 2020-07-12 DIAGNOSIS — I110 Hypertensive heart disease with heart failure: Secondary | ICD-10-CM | POA: Diagnosis not present

## 2020-07-12 DIAGNOSIS — L97522 Non-pressure chronic ulcer of other part of left foot with fat layer exposed: Secondary | ICD-10-CM | POA: Diagnosis not present

## 2020-07-12 LAB — PROTEIN ELECTROPHORESIS, SERUM
A/G Ratio: 0.8 (ref 0.7–1.7)
Albumin ELP: 2.7 g/dL — ABNORMAL LOW (ref 2.9–4.4)
Alpha-1-Globulin: 0.4 g/dL (ref 0.0–0.4)
Alpha-2-Globulin: 1 g/dL (ref 0.4–1.0)
Beta Globulin: 0.9 g/dL (ref 0.7–1.3)
Gamma Globulin: 1.1 g/dL (ref 0.4–1.8)
Globulin, Total: 3.4 g/dL (ref 2.2–3.9)
M-Spike, %: 0.2 g/dL — ABNORMAL HIGH
Total Protein ELP: 6.1 g/dL (ref 6.0–8.5)

## 2020-07-12 NOTE — Telephone Encounter (Signed)
Called to inform patient of his upcoming appointments 3/22 los. Patient is aware.

## 2020-07-12 NOTE — Progress Notes (Signed)
[name], [name] ([id number]) Visit Report for 07/12/2020 Debridement Details Patient Name: Date of Service: [name], Brian RLES E. 07/12/2020 10:45 A M Medical Record Number: [id number] Patient Account Number: [id number] Date of Birth/Sex: Treating RN: [birth year] (80 y.o. [name], [name] Primary Care Provider: Pricilla Mcdowell Other Clinician: Referring Provider: Treating Provider/Extender: [name] in Treatment: 4 Debridement Performed for Assessment: Wound #11 Left T Great oe Performed By: Physician [name]., MD Debridement Type: Debridement Level of Consciousness (Pre-procedure): Awake and Alert Pre-procedure Verification/Time Out Yes - 11:41 Taken: Start Time: 11:42 Pain Control: Lidocaine 4% T opical Solution T Area Debrided (L x W): otal 0.7 (cm) x 0.6 (cm) = 0.42 (cm) Tissue and other material debrided: Viable, Non-Viable, Slough, Subcutaneous, Skin: Dermis , Skin: Epidermis, Slough Level: Skin/Subcutaneous Tissue Debridement Description: Excisional Instrument: Curette Bleeding: Minimum Hemostasis Achieved: Pressure End Time: 11:46 Procedural Pain: 0 Post Procedural Pain: 0 Response to Treatment: Procedure was tolerated well Level of Consciousness (Post- Awake and Alert procedure): Post Debridement Measurements of Total Wound Length: (cm) 0.7 Width: (cm) 0.6 Depth: (cm) 0.1 Volume: (cm) 0.033 Character of Wound/Ulcer Post Debridement: Improved Post Procedure Diagnosis Same as Pre-procedure Electronic Signature(s) Signed: 07/12/2020 4:40:49 PM By: Brian Ham MD Signed: 07/12/2020 4:43:39 PM By: [name] Entered By: [name] on 07/12/2020 11:47:22 -------------------------------------------------------------------------------- HPI Details Patient Name: Date of Service: [name], Brian RLES E. 07/12/2020 10:45 A M Medical Record Number: [id number] Patient Account Number: [id number] Date of Birth/Sex:  Treating RN: [birth year] (80 y.o. [name] Primary Care Provider: Pricilla Mcdowell Other Clinician: Referring Provider: Treating Provider/Extender: [name] in Treatment: 4 History of Present Illness HPI Description: ADMISSION 06/12/2020 This is a 80 year old man who has had wounds on his bilateral lower legs and feet for several months now. Looking in epic he saw his primary physician and early November at which time he had an ulcer of the right calf. He was seen again by his primary doctor on 05/12/2020 at which time he had bilateral lower extremity ulcers. He was reviewed by [name] of vein and vascular in the spring 2021. He had arterial studies done on 08/12/2019 showing an ABI on the right and 0.74 on the left and 0.69. TBI of the right of 0.29 on the left 0.35 with monophasic waveforms bilaterally. He also had venous reflux studies on 09/29/2019 that did not show evidence of DVT or SVT bilaterally. In the right he had superficial vein reflux in the SSV and mid thigh AASV greater saphenous vein had previously been harvested. On the left he did not have any superficial vein reflux but it was noted that he had interstitial fluid without throughout the lower extremity. He also has a history of CABG, congestive heart failure followed by [name] and apparently liver cirrhosis. He takes Lasix 80 mg as well as spironolactone. He arrives in clinic today with 9 wounds. Most of these are superficial and look like they started with blisters. He has areas on the left dorsal foot x2, left posterior calf left anterior tibial left posterior thigh x2 the right medial lower extremity and the right medial malleolus. He has pitting edema extending well up into his thighs skin changes look like chronic stasis dermatitis. [name] saw him in May noted that he had tolerated Unna boots and was feeling better. Also noted the moderate arterial insufficiency and  suggested if he developed nonhealing wounds then he may need attempted angiography. Past medical history extensive  including multiple left hip surgeries, congestive heart failure, cirrhosis, peripheral arterial disease, chronic atrial fibrillation, recurrent cellulitis of the legs, AAA, carotid stenosis, COPD, hypertension and hyperlipidemia We did not attempt his arterial ABIs in our clinic today because of swelling 3/1; this is a patient I admitted to the clinic last week he has multiple wounds on his bilateral lower extremities. I think he has chronic venous insufficiency and PAD. He tolerated the "light" 3 layer wrap I put him on last week and the edema control is better. 06/26/2020 upon evaluation today patient appears to be doing well with regard to his legs overall. He does have several areas of blistering but to be perfectly honest this seems to be making some progress here. Fortunately there is no signs of active infection which is great news. No fevers, chills, nausea, vomiting, or diarrhea. He does have a lot of callus over the great toe that is going require some sharp debridement. I discussed that with him today however I am that I do this extremely likely considering the fact that he does appear to potentially have some peripheral vascular disease specifically of the arterial type. Nonetheless he does have a an appointment on March 30 for formal arterial studies. 3/17; bilateral lower extremity wounds in the setting of chronic venous insufficiency and arterial insufficiency. He has a follow-up with [name] later this month and he is going to have follow-up noninvasive studies. His renal studies done in April 2021 were really very poor with ABIs of 0.74 and 0.69. TBI's less than 0.3 bilateral 3/24; patient has follow-up with [name] on March 30. In spite of the arterial studies which are really not very good he is tolerated a light 3 layer compression and we have good edema control.  As a result of this he has had healing on the area on the left lateral thigh the posterior wounds on his calves bilaterally are healed. He still has an area on the left great toe right medial ankle Electronic Signature(s) Signed: 07/12/2020 4:40:49 PM By: Brian Ham MD Entered By: [name] on 07/12/2020 12:08:50 -------------------------------------------------------------------------------- Physical Exam Details Patient Name: Date of Service: [name], Brian RLES E. 07/12/2020 10:45 A M Medical Record Number: [id number] Patient Account Number: [id number] Date of Birth/Sex: Treating RN: 1941/03/31 (80 y.o. [name] Primary Care Provider: Pricilla Mcdowell Other Clinician: Referring Provider: Treating Provider/Extender: [name] in Treatment: 4 Constitutional Sitting or standing Blood Pressure is within target range for patient.. Pulse regular and within target range for patient.[name] Respirations regular, non-labored and within target range.. Temperature is normal and within the target range for the patient.[name] Appears in no distress. Cardiovascular Pedal pulses absent bilaterally.. We have nice edema control. Notes Wound exam; all of the patient's wounds on his legs especially his inner part of his left thigh, posterior calves bilaterally are healed. He has a punched-out area on the left dorsal toe. Nonviable surface I used a #3 curette to debride this down to healthy tissue there is bleeding controlled with direct pressure on the right medial ankle there is a small remaining area here. Electronic Signature(s) Signed: 07/12/2020 4:40:49 PM By: Brian Ham MD Entered By: [name] on 07/12/2020 12:11:26 -------------------------------------------------------------------------------- Physician Orders Details Patient Name: Date of Service: [name], Brian RLES E. 07/12/2020 10:45 A M Medical Record Number: [id number] Patient Account  Number: [id number] Date of Birth/Sex: Treating RN: [birth year] (80 y.o. 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 - ***Hoyer extra time 60 minutes*** Return A Bathing/ Shower/ Hygiene May shower with protection but do not get wound dressing(s) wet. - May use cast protectors over wraps. You can find these at [facility] or [facility] Edema Control - Lymphedema / SCD / Other Elevate legs to the level of the heart or above for 30 minutes daily and/or when sitting, a frequency of: Avoid standing for long periods of time. Compression stocking or Garment 20-30 mm/Hg pressure to: - patient and family to purchase compression stockings. Bring in weekly. do not wear until wound Mcdowell says to do so. Additional Orders / Instructions Other: - ***Ensure to remove compression wraps from both legs before seeing [name] on Wednesday 07/18/2020. Cover wounds with bandaids.*** Non Wound Condition Protect area with: - Cover scrotal area with zinc oxide and cover with ABD pads Home Health dmit to [facility] for wound care. May utilize formulary equivalent dressing for wound treatment orders unless otherwise specified.  - A weekly by home health and weekly by wound Mcdowell. Home Health also for physical Therapy to evaluate and treat. Wound Treatment Wound #1 - Malleolus Wound Laterality: Right, Medial Cleanser: Wound Cleanser 2 x Per Week/15 Days Discharge Instructions: Cleanse the wound with wound cleanser prior to applying a clean dressing using gauze sponges, not tissue or cotton balls. Cleanser: Soap and Water 2 x Per Week/15 Days Discharge Instructions: May shower and wash wound with dial antibacterial soap and water prior to dressing change. Prim Dressing: KerraCel Ag Gelling Fiber Dressing, 4x5 in (silver alginate) 2 x Per Week/15 Days ary Discharge Instructions: Apply silver alginate to wound bed as instructed Secondary Dressing: ABD Pad, 5x9 2 x Per Week/15 Days Discharge Instructions: Apply over primary dressing as directed. Compression Wrap: ThreePress (3 layer compression wrap) 2 x Per Week/15 Days Discharge Instructions: Apply LIGHTY three layer compression as directed. Wound #11 - T Great oe Wound Laterality: Left Cleanser: Wound Cleanser 2 x Per Week/15 Days Discharge Instructions: Cleanse the wound with wound cleanser prior to applying a clean dressing using gauze sponges, not tissue or cotton balls. Cleanser: Soap and Water 2 x Per Week/15 Days Discharge Instructions: May shower and wash wound with dial antibacterial soap and water prior to dressing change. Prim Dressing: KerraCel Ag Gelling Fiber Dressing, 4x5 in (silver alginate) 2 x Per Week/15 Days ary Discharge Instructions: Apply silver alginate to wound bed as instructed Secondary Dressing: Woven Gauze Sponges 2x2 in 2 x Per Week/15 Days Discharge Instructions: Apply over primary dressing as directed. Secured With: Child psychotherapist, Sterile 2x75 (in/in) 2 x Per Week/15 Days Discharge Instructions: Secure with stretch gauze as directed. Secured With: Paper Tape, 2x10 (in/yd) 2 x Per Week/15 Days Discharge  Instructions: Secure dressing with tape as directed. Wound #4 - Foot Wound Laterality: Dorsal, Left Cleanser: Wound Cleanser 2 x Per Week/15 Days Discharge Instructions: Cleanse the wound with wound cleanser prior to applying  a clean dressing using gauze sponges, not tissue or cotton balls. Cleanser: Soap and Water 2 x Per Week/15 Days Discharge Instructions: May shower and wash wound with dial antibacterial soap and water prior to dressing change. Prim Dressing: KerraCel Ag Gelling Fiber Dressing, 4x5 in (silver alginate) 2 x Per Week/15 Days ary Discharge Instructions: Apply silver alginate to wound bed as instructed Secondary Dressing: ABD Pad, 5x9 2 x Per Week/15 Days Discharge Instructions: Apply over primary dressing as directed. Compression Wrap: ThreePress (3 layer compression wrap) 2 x Per Week/15 Days Discharge Instructions: Apply LIGHTY three layer compression as directed. Electronic Signature(s) Signed: 07/12/2020 4:40:49 PM By: Brian Ham MD Signed: 07/12/2020 4:43:39 PM By: [name] Entered By: [name] on 07/12/2020 11:58:16 -------------------------------------------------------------------------------- Problem List Details Patient Name: Date of Service: [name], Brian RLES E. 07/12/2020 10:45 A M Medical Record Number: [id number] Patient Account Number: [id number] Date of Birth/Sex: Treating RN: 1941-04-03 (80 y.o. 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, bilateral legs 06/12/2020 No Yes L97.528 Non-pressure chronic ulcer of other part of left foot  with other specified 06/12/2020 No Yes severity L97.228 Non-pressure chronic ulcer of left calf with other specified severity 06/12/2020 No Yes L97.128 Non-pressure chronic ulcer of left thigh with other specified severity 06/12/2020 No Yes L97.818 Non-pressure chronic ulcer of other part of right lower leg with other specified 06/12/2020 No Yes severity Inactive Problems Resolved Problems Electronic Signature(s) Signed: 07/12/2020 4:40:49 PM By: Brian Ham MD Entered By: [name] on 07/12/2020 12:07:43 -------------------------------------------------------------------------------- Progress Note Details Patient Name: Date of Service: [name], Brian RLES E. 07/12/2020 10:45 A M Medical Record Number: [id number] Patient Account Number: [id number] Date of Birth/Sex: Treating RN: [birth year] (80 y.o. [name] Primary Care Provider: Pricilla Mcdowell Other Clinician: Referring Provider: Treating Provider/Extender: [name] in Treatment: 4 Subjective History of Present Illness (HPI) ADMISSION 06/12/2020 This is a 80 year old man who has had wounds on his bilateral lower legs and feet for several months now. Looking in epic he saw his primary physician and early November at which time he had an ulcer of the right calf. He was seen again by his primary doctor on 05/12/2020 at which time he had bilateral lower extremity ulcers. He was reviewed by [name] of vein and vascular in the spring 2021. He had arterial studies done on 08/12/2019 showing an ABI on the right and 0.74 on the left and 0.69. TBI of the right of 0.29 on the left 0.35 with monophasic waveforms bilaterally. He also had venous reflux studies on 09/29/2019 that did not show evidence of DVT or SVT bilaterally. In the right he had superficial vein reflux in the SSV and mid thigh AASV greater saphenous vein had previously been harvested. On the left he did not have any superficial vein  reflux but it was noted that he had interstitial fluid without throughout the lower extremity. He also has a history of CABG, congestive heart failure followed by [name] and apparently liver cirrhosis. He takes Lasix 80 mg as well as spironolactone. He arrives in clinic today with 9 wounds. Most of these are superficial and look like they started with blisters. He has areas on the left dorsal  foot x2, left posterior calf left anterior tibial left posterior thigh x2 the right medial lower extremity and the right medial malleolus. He has pitting edema extending well up into his thighs skin changes look like chronic stasis dermatitis. [name] saw him in May noted that he had tolerated Unna boots and was feeling better. Also noted the moderate arterial insufficiency and suggested if he developed nonhealing wounds then he may need attempted angiography. Past medical history extensive including multiple left hip surgeries, congestive heart failure, cirrhosis, peripheral arterial disease, chronic atrial fibrillation, recurrent cellulitis of the legs, AAA, carotid stenosis, COPD, hypertension and hyperlipidemia We did not attempt his arterial ABIs in our clinic today because of swelling 3/1; this is a patient I admitted to the clinic last week he has multiple wounds on his bilateral lower extremities. I think he has chronic venous insufficiency and PAD. He tolerated the "light" 3 layer wrap I put him on last week and the edema control is better. 06/26/2020 upon evaluation today patient appears to be doing well with regard to his legs overall. He does have several areas of blistering but to be perfectly honest this seems to be making some progress here. Fortunately there is no signs of active infection which is great news. No fevers, chills, nausea, vomiting, or diarrhea. He does have a lot of callus over the great toe that is going require some sharp debridement. I discussed that with him today however  I am that I do this extremely likely considering the fact that he does appear to potentially have some peripheral vascular disease specifically of the arterial type. Nonetheless he does have a an appointment on March 30 for formal arterial studies. 3/17; bilateral lower extremity wounds in the setting of chronic venous insufficiency and arterial insufficiency. He has a follow-up with [name] later this month and he is going to have follow-up noninvasive studies. His renal studies done in April 2021 were really very poor with ABIs of 0.74 and 0.69. TBI's less than 0.3 bilateral 3/24; patient has follow-up with [name] on March 30. In spite of the arterial studies which are really not very good he is tolerated a light 3 layer compression and we have good edema control. As a result of this he has had healing on the area on the left lateral thigh the posterior wounds on his calves bilaterally are healed. He still has an area on the left great toe right medial ankle Objective Constitutional Sitting or standing Blood Pressure is within target range for patient.. Pulse regular and within target range for patient.[name] Respirations regular, non-labored and within target range.. Temperature is normal and within the target range for the patient.[name] Appears in no distress. Vitals Time Taken: 11:20 AM, Height: 71 in, Weight: 178 lbs, BMI: 24.8, Temperature: 97.8 F, Pulse: 71 bpm, Respiratory Rate: 18 breaths/min, Blood Pressure: 137/61 mmHg. Cardiovascular Pedal pulses absent bilaterally.. We have nice edema control. General Notes: Wound exam; all of the patient's wounds on his legs especially his inner part of his left thigh, posterior calves bilaterally are healed. He has a punched-out area on the left dorsal toe. Nonviable surface I used a #3 curette to debride this down to healthy tissue there is bleeding controlled with direct pressure on the right medial ankle there is a small remaining area  here. Integumentary (Hair, Skin) Wound #1 status is Open. Original cause of wound was Gradually Appeared. The date acquired was: 11/20/2019. The wound has been in treatment 4 weeks. The  wound is located on the Right,Medial Malleolus. The wound measures 0.7cm length x 1.1cm width x 0.2cm depth; 0.605cm^2 area and 0.121cm^3 volume. There is Fat Layer (Subcutaneous Tissue) exposed. There is no tunneling or undermining noted. There is a medium amount of serosanguineous drainage noted. The wound margin is well defined and not attached to the wound base. There is medium (34-66%) red granulation within the wound bed. There is a medium (34-66%) amount of necrotic tissue within the wound bed including Adherent Slough. Wound #10 status is Healed - Epithelialized. Original cause of wound was Gradually Appeared. The date acquired was: 06/26/2020. The wound has been in treatment 2 weeks. The wound is located on the Left,Lateral Foot. The wound measures 0cm length x 0cm width x 0cm depth; 0cm^2 area and 0cm^3 volume. Wound #11 status is Open. Original cause of wound was Gradually Appeared. The date acquired was: 06/26/2020. The wound has been in treatment 2 weeks. The wound is located on the Left T Great. The wound measures 0.7cm length x 0.6cm width x 0.1cm depth; 0.33cm^2 area and 0.033cm^3 volume. There is Fat oe Layer (Subcutaneous Tissue) exposed. There is no tunneling or undermining noted. There is a medium amount of serosanguineous drainage noted. The wound margin is distinct with the outline attached to the wound base. There is medium (34-66%) red, pink granulation within the wound bed. There is a small (1-33%) amount of necrotic tissue within the wound bed including Adherent Slough. Wound #2 status is Healed - Epithelialized. Original cause of wound was Gradually Appeared. The date acquired was: 11/20/2019. The wound has been in treatment 4 weeks. The wound is located on the Right,Posterior Lower Leg. The wound  measures 0cm length x 0cm width x 0cm depth; 0cm^2 area and 0cm^3 volume. Wound #4 status is Open. Original cause of wound was Blister. The date acquired was: 11/20/2019. The wound has been in treatment 4 weeks. The wound is located on the Left,Dorsal Foot. The wound measures 0.7cm length x 0.2cm width x 0.1cm depth; 0.11cm^2 area and 0.011cm^3 volume. There is Fat Layer (Subcutaneous Tissue) exposed. There is no tunneling or undermining noted. There is a medium amount of serosanguineous drainage noted. The wound margin is distinct with the outline attached to the wound base. There is large (67-100%) pink granulation within the wound bed. There is a small (1-33%) amount of necrotic tissue within the wound bed including Adherent Slough. Wound #6 status is Healed - Epithelialized. Original cause of wound was Gradually Appeared. The date acquired was: 11/20/2019. The wound has been in treatment 4 weeks. The wound is located on the Left,Posterior Lower Leg. The wound measures 0cm length x 0cm width x 0cm depth; 0cm^2 area and 0cm^3 volume. Wound #9 status is Healed - Epithelialized. Original cause of wound was Pressure Injury. The date acquired was: 05/23/2019. The wound has been in treatment 4 weeks. The wound is located on the Left,Proximal,Posterior Upper Leg. The wound measures 0cm length x 0cm width x 0cm depth; 0cm^2 area and 0cm^3 volume. 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 There was a Excisional Skin/Subcutaneous Tissue Debridement with a oe total area of 0.42 sq cm performed by [name]., MD. With the following instrument(s): Curette to remove Viable and Non-Viable tissue/material. Material removed includes Subcutaneous Tissue, Slough, Skin: Dermis, and Skin: Epidermis after achieving pain control using Lidocaine 4% T opical Solution. A time out was conducted at 11:41, prior to the start of the procedure. A Minimum amount of bleeding was controlled with Pressure. The procedure was tolerated well with a pain level of 0 throughout and a pain level of 0 following the procedure. Post Debridement Measurements: 0.7cm length x 0.6cm width x 0.1cm depth; 0.033cm^3 volume. Character of Wound/Ulcer Post Debridement is improved. Post procedure Diagnosis Wound #11: Same as Pre-Procedure Wound #1 Pre-procedure diagnosis of Wound #1 is a Venous Leg Ulcer located on the Right,Medial Malleolus . There was a Three Layer Compression Therapy Procedure by [name], RN. Post procedure Diagnosis Wound #1: Same as Pre-Procedure Wound #4 Pre-procedure diagnosis of Wound #4 is a Lymphedema located on the Left,Dorsal Foot . There was a Three Layer Compression Therapy Procedure by [name], RN. Post procedure Diagnosis Wound #4: Same as Pre-Procedure Plan Follow-up Appointments: Return Appointment in 1 week. - ***Hoyer extra time 60 minutes*** Bathing/ Shower/ Hygiene: May shower with protection but do not get wound dressing(s) wet. - May use cast protectors over wraps. You can find these at [facility] or [facility] Edema Control - Lymphedema / SCD / Other: Elevate legs to the level of the heart or above for 30 minutes daily and/or when sitting, a frequency of: Avoid standing for long periods of time. Compression stocking or Garment 20-30 mm/Hg pressure to: - patient and family to purchase  compression stockings. Bring in weekly. do not wear until wound Mcdowell says to do so. Additional Orders / Instructions: Other: - ***Ensure to remove compression wraps from both legs before seeing [name] on Wednesday 07/18/2020. Cover wounds with bandaids.*** Non Wound Condition: Protect area with: - Cover scrotal area with zinc oxide and cover with ABD pads Home Health: Admit to [facility] for wound care. May utilize formulary equivalent dressing for wound treatment orders unless otherwise specified. - weekly by home health and weekly by wound Mcdowell. Home Health also for physical Therapy to evaluate and treat. WOUND #1: - Malleolus Wound Laterality: Right, Medial Cleanser: Wound Cleanser 2 x Per Week/15 Days Discharge Instructions: Cleanse the wound with wound cleanser prior to applying a clean dressing using gauze sponges, not tissue or cotton balls. Cleanser: Soap and Water 2 x Per Week/15 Days Discharge Instructions: May shower and wash wound with dial antibacterial soap and water prior to dressing change. Prim Dressing: KerraCel Ag Gelling Fiber Dressing, 4x5 in (silver alginate) 2 x Per Week/15 Days ary Discharge Instructions: Apply silver alginate to wound bed as instructed Secondary Dressing: ABD Pad, 5x9 2 x Per Week/15 Days Discharge Instructions: Apply over primary dressing as directed. Com pression Wrap: ThreePress (3 layer compression wrap) 2 x Per Week/15 Days Discharge Instructions: Apply LIGHTY three layer compression as directed. WOUND #11: - T Great Wound Laterality: Left oe Cleanser: Wound Cleanser 2 x Per Week/15 Days Discharge Instructions: Cleanse the wound with wound cleanser prior to applying a clean dressing using gauze sponges, not tissue or cotton balls. Cleanser: Soap and Water 2 x Per Week/15 Days Discharge Instructions: May shower and wash wound with dial antibacterial soap and water prior to dressing change. Prim Dressing: KerraCel Ag  Gelling Fiber  Dressing, 4x5 in (silver alginate) 2 x Per Week/15 Days ary Discharge Instructions: Apply silver alginate to wound bed as instructed Secondary Dressing: Woven Gauze Sponges 2x2 in 2 x Per Week/15 Days Discharge Instructions: Apply over primary dressing as directed. Secured With: Child psychotherapist, Sterile 2x75 (in/in) 2 x Per Week/15 Days Discharge Instructions: Secure with stretch gauze as directed. Secured With: Paper T ape, 2x10 (in/yd) 2 x Per Week/15 Days Discharge Instructions: Secure dressing with tape as directed. WOUND #4: - Foot Wound Laterality: Dorsal, Left Cleanser: Wound Cleanser 2 x Per Week/15 Days Discharge Instructions: Cleanse the wound with wound cleanser prior to applying a clean dressing using gauze sponges, not tissue or cotton balls. Cleanser: Soap and Water 2 x Per Week/15 Days Discharge Instructions: May shower and wash wound with dial antibacterial soap and water prior to dressing change. Prim Dressing: KerraCel Ag Gelling Fiber Dressing, 4x5 in (silver alginate) 2 x Per Week/15 Days ary Discharge Instructions: Apply silver alginate to wound bed as instructed Secondary Dressing: ABD Pad, 5x9 2 x Per Week/15 Days Discharge Instructions: Apply over primary dressing as directed. Com pression Wrap: ThreePress (3 layer compression wrap) 2 x Per Week/15 Days Discharge Instructions: Apply LIGHTY three layer compression as directed. 1. We are using silver alginate 2. The left first toe wound may require change in dressing I will have to see. 3. Remarkable improvement in the rest of his wounds on the bilateral lower legs especially the capsule. 4. His edema control is excellent Electronic Signature(s) Signed: 07/12/2020 4:40:49 PM By: Brian Ham MD Entered By: [name] on 07/12/2020 12:12:25 -------------------------------------------------------------------------------- SuperBill Details Patient Name: Date of Service: [name], Brian RLES E.  07/12/2020 Medical Record Number: [id number] Patient Account Number: [id number] Date of Birth/Sex: Treating RN: 1940-12-05 (80 y.o. [name], [name] Primary Care Provider: Pricilla Mcdowell Other Clinician: Referring Provider: Treating Provider/Extender: [name] in Treatment: 4 Diagnosis Coding ICD-10 Codes Code Description [phone] Chronic venous hypertension (idiopathic) with ulcer and inflammation of bilateral lower extremity I70.203 Unspecified atherosclerosis of native arteries of extremities, bilateral legs L97.528 Non-pressure chronic ulcer of other part of left foot with other specified severity L97.228 Non-pressure chronic ulcer of left calf with other specified severity L97.128 Non-pressure chronic ulcer of left thigh with other specified severity L97.818 Non-pressure chronic ulcer of other part of right lower leg with other specified severity Facility Procedures CPT4 Code: 56433295 Description: 18841 - DEB SUBQ TISSUE 20 SQ CM/< ICD-10 Diagnosis Description L97.528 Non-pressure chronic ulcer of other part of left foot with other specified severity Modifier: Quantity: 1 CPT4 Code: 66063016 Description: (Facility Use Only) 01093AT - APPLY MULTLAY COMPRS LWR RT LEG Modifier: 10 Quantity: 1 Physician Procedures : CPT4 Code Description Modifier [id number] - WC PHYS SUBQ TISS 20 SQ CM ICD-10 Diagnosis Description L97.528 Non-pressure chronic ulcer of other part of left foot with other specified severity Quantity: 1 Electronic Signature(s) Signed: 07/12/2020 4:40:49 PM By: Brian Ham MD Entered By: [name] on 07/12/2020 12:12:39

## 2020-07-16 ENCOUNTER — Other Ambulatory Visit: Payer: Self-pay | Admitting: Physician Assistant

## 2020-07-16 ENCOUNTER — Telehealth: Payer: Self-pay | Admitting: Internal Medicine

## 2020-07-16 DIAGNOSIS — D500 Iron deficiency anemia secondary to blood loss (chronic): Secondary | ICD-10-CM

## 2020-07-16 DIAGNOSIS — D509 Iron deficiency anemia, unspecified: Secondary | ICD-10-CM

## 2020-07-16 NOTE — Telephone Encounter (Signed)
Referral done

## 2020-07-16 NOTE — Telephone Encounter (Signed)
Patient called and said that heat care is wanting him to have a colonoscopy and endoscopy. He was wondering if a referral could be placed. He can be reached at [phone]. Please advise

## 2020-07-16 NOTE — Progress Notes (Signed)
Due to insurance restrictions, we will change IV iron infusion from IV feraheme x 2 doses to IV venofer x 5 doses.

## 2020-07-16 NOTE — Telephone Encounter (Signed)
See below

## 2020-07-17 ENCOUNTER — Other Ambulatory Visit: Payer: Self-pay

## 2020-07-17 ENCOUNTER — Telehealth: Payer: Self-pay | Admitting: Internal Medicine

## 2020-07-17 ENCOUNTER — Inpatient Hospital Stay: Payer: Medicare Other

## 2020-07-17 VITALS — BP 124/61 | HR 86 | Temp 99.1°F | Resp 18

## 2020-07-17 DIAGNOSIS — I130 Hypertensive heart and chronic kidney disease with heart failure and stage 1 through stage 4 chronic kidney disease, or unspecified chronic kidney disease: Secondary | ICD-10-CM | POA: Diagnosis not present

## 2020-07-17 DIAGNOSIS — I509 Heart failure, unspecified: Secondary | ICD-10-CM | POA: Diagnosis not present

## 2020-07-17 DIAGNOSIS — I2510 Atherosclerotic heart disease of native coronary artery without angina pectoris: Secondary | ICD-10-CM | POA: Diagnosis not present

## 2020-07-17 DIAGNOSIS — D509 Iron deficiency anemia, unspecified: Secondary | ICD-10-CM | POA: Diagnosis not present

## 2020-07-17 DIAGNOSIS — Z87891 Personal history of nicotine dependence: Secondary | ICD-10-CM | POA: Diagnosis not present

## 2020-07-17 DIAGNOSIS — I4891 Unspecified atrial fibrillation: Secondary | ICD-10-CM | POA: Diagnosis not present

## 2020-07-17 DIAGNOSIS — M7989 Other specified soft tissue disorders: Secondary | ICD-10-CM | POA: Diagnosis not present

## 2020-07-17 DIAGNOSIS — J449 Chronic obstructive pulmonary disease, unspecified: Secondary | ICD-10-CM | POA: Diagnosis not present

## 2020-07-17 DIAGNOSIS — K921 Melena: Secondary | ICD-10-CM | POA: Diagnosis not present

## 2020-07-17 DIAGNOSIS — N183 Chronic kidney disease, stage 3 unspecified: Secondary | ICD-10-CM | POA: Diagnosis not present

## 2020-07-17 MED ORDER — SODIUM CHLORIDE 0.9 % IV SOLN
Freq: Once | INTRAVENOUS | Status: AC
  Filled 2020-07-17: qty 250

## 2020-07-17 MED ORDER — SODIUM CHLORIDE 0.9 % IV SOLN
200.0000 mg | Freq: Once | INTRAVENOUS | Status: AC
  Administered 2020-07-17: 200 mg via INTRAVENOUS
  Filled 2020-07-17: qty 200

## 2020-07-17 NOTE — Patient Instructions (Signed)

## 2020-07-17 NOTE — Telephone Encounter (Signed)
Follow up message  Patient calling for status of hospital bed

## 2020-07-17 NOTE — Telephone Encounter (Signed)
Patient called and is requesting a hospital bed. He said he has a hard time getting out of bed. He can be reached at [phone].

## 2020-07-18 ENCOUNTER — Ambulatory Visit (INDEPENDENT_AMBULATORY_CARE_PROVIDER_SITE_OTHER): Payer: Medicare Other | Admitting: Physician Assistant

## 2020-07-18 ENCOUNTER — Ambulatory Visit (HOSPITAL_COMMUNITY)
Admission: RE | Admit: 2020-07-18 | Discharge: 2020-07-18 | Disposition: A | Discharge status: Home / Self Care | Payer: Medicare Other | Source: Ambulatory Visit | Attending: Vascular Surgery | Admitting: Vascular Surgery

## 2020-07-18 ENCOUNTER — Telehealth: Payer: Self-pay | Admitting: Physician Assistant

## 2020-07-18 ENCOUNTER — Telehealth: Payer: Self-pay

## 2020-07-18 VITALS — BP 120/62 | HR 62 | Temp 99.0°F | Resp 20 | Ht 70.0 in | Wt 180.0 lb

## 2020-07-18 DIAGNOSIS — L97529 Non-pressure chronic ulcer of other part of left foot with unspecified severity: Secondary | ICD-10-CM | POA: Diagnosis not present

## 2020-07-18 DIAGNOSIS — I739 Peripheral vascular disease, unspecified: Secondary | ICD-10-CM | POA: Diagnosis not present

## 2020-07-18 DIAGNOSIS — I872 Venous insufficiency (chronic) (peripheral): Secondary | ICD-10-CM | POA: Insufficient documentation

## 2020-07-18 NOTE — Progress Notes (Signed)
Office Note     CC:  follow up Requesting Provider:  [name], *  HPI: [name] is a 80 y.o. (Apr 19, 1941) male who presents for peripheral arterial disease, venous insufficiency, who missed his follow-up appointment last year.  The patient states he is undergoing wound care at [facility] wound care center for right lower extremity ulcers.  He states he had 10-12 small ulcers of the right lower extremity gaiter region which have healed except for 1.  His chief complaint today is burning of the left toes.  He states he had a "cyst or something" recently removed from the plantar aspect of his left great toe.  He cannot give me details on this procedure but states it was done in the last 2 weeks.  The patient has a history of cerebral palsy and is wheelchair-bound.  He has had several orthopedic procedures for dislocated hips.  He also has history of chronic pain through the hips and back.  He states this is stable.  He does not describe rest pain.  Complaint with statin. On Eliquis for a. Fib. Former smoker. Not diabetic.  He has a history of chronic kidney disease stage III.  He is status post coronary artery bypass grafting in July 2010.  Past Medical History:  Diagnosis Date  . AAA (abdominal aortic aneurysm) ([place])   . Blindness of left eye   . 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 CAD (coronary artery disease)    a. s/p CABG in 10/2008 with LIMA-LAD, SVG-OM1 with Y-graft to PL branch, and SVG-RCA  . Carotid stenosis   . Cerebrovascular disease   . CHF (congestive heart failure) ([place])   . Cirrhosis ([place])    on CT a/p 07/2013, no longer drinking  . CONGENITAL UNSPEC REDUCTION DEFORMITY LOWER LIMB   . Constipation due to opioid therapy 2014   began about a year ago  . COPD (chronic obstructive pulmonary disease) ([place])   . HTN (hypertension)   . Hyperlipidemia   . 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  Peripheral vascular disease ([place])   . TOBACCO USE, QUIT     Past Surgical History:  Procedure Laterality Date  . CORONARY ARTERY BYPASS GRAFT  11/03/2008   Ricard Dillon - x4: left internal mammary artery to the distal left anterior descending, saphemous vein graft to the first circumflex marginal branch with a Y graft sequentiallly to a left posterolateral branch, saphenous vein graft to the distal right coronary artery  . ELECTROPHYSIOLOGIC STUDY N/A 01/18/2015   Procedure: A-Flutter Ablation;  Surgeon: Deboraha Sprang, MD;  Location: [facility];  Service: Cardiovascular;  Laterality: N/A;  . GREEN LIGHT LASER TURP (TRANSURETHRAL RESECTION OF PROSTATE N/A 02/14/2014   Procedure: GREEN LIGHT LASER TURP (TRANSURETHRAL RESECTION OF PROSTATE;  Surgeon: Festus Aloe, MD;  Location: [facility];  Service: Urology;  Laterality: N/A;  . HIP ARTHROPLASTY Left 01/26/2018   Procedure: LEFT HIP POSTERIOR HEMIARTHROPLASTY;  Surgeon: [name], MD;  Location: [facility];  Service: Orthopedics;  Laterality: Left;  . HIP CLOSED REDUCTION Left 09/18/2018   Procedure: CLOSED MANIPULATION HIP;  Surgeon: Justice Britain, MD;  Location: [facility];  Service: Orthopedics;  Laterality: Left;  . HIP CLOSED REDUCTION Left 10/08/2018   Procedure: CLOSED MANIPULATION HIP;  Surgeon: Rod Can, MD;  Location: [facility];  Service: Orthopedics;  Laterality: Left;  . TOTAL HIP REVISION Left 06/24/2018   Procedure: ACETABULAR HIP REVISION POSTERIOR;  Surgeon:  [name], MD;  Location: [facility];  Service: Orthopedics;  Laterality: Left;  . TOTAL HIP REVISION Left 10/21/2018   Procedure: POSTERIOR REVISION HIP WITH POSSIBLE CONSTRAINED LINER;  Surgeon: [name], MD;  Location: [facility];  Service: Orthopedics;  Laterality: Left;    Social History   Socioeconomic History  . Marital status: Widowed    Spouse name: Not on file  . Number of children: 2  . Years of education: 67  . Highest education level: High school graduate   Occupational History  . Occupation: Retired  Tobacco Use  . Smoking status: Former Research scientist (life sciences)  . Smokeless tobacco: Never Used  Vaping Use  . Vaping Use: Never used  Substance and Sexual Activity  . Alcohol use: No    Alcohol/week: 0.0 standard drinks    Comment: History of heavy alcohol use per pt. Quit many years ago1/1/ 2005  . Drug use: No  . Sexual activity: Not Currently  Other Topics Concern  . Not on file  Social History Narrative   He lives alone here in [place].  He has 1 son, who lives in the [place],    Social Determinants of Health   Financial Resource Strain: Not on file  Food Insecurity: Not on file  Transportation Needs: Not on file  Physical Activity: Not on file  Stress: Not on file  Social Connections: Not on file  Intimate Partner Violence: Not on file   Family History  Problem Relation Age of Onset  . Cirrhosis Mother        died at 30  . Heart attack Father   . Other Brother        GSW  . Other Brother        died in house fire  . Cancer Brother        unsure of type Believes colon or prostate/fim  . Colon cancer Neg Hx   . Stomach cancer Neg Hx     Current Outpatient Medications  Medication Sig Dispense Refill  . albuterol (VENTOLIN HFA) 108 (90 Base) MCG/ACT inhaler TAKE 2 PUFFS BY MOUTH EVERY 6 HOURS AS NEEDED FOR WHEEZE OR SHORTNESS OF BREATH 18 each 2  . b complex vitamins tablet Take 1 tablet by mouth daily.    . buprenorphine (BUTRANS) 7.5 MCG/HR Place 1 patch onto the skin once a week.    . Cholecalciferol (VITAMIN D3 PO) Take by mouth.    . DULoxetine (CYMBALTA) 30 MG capsule Take 1 capsule (30 mg total) by mouth daily. 30 capsule 3  . ELIQUIS 5 MG TABS tablet TAKE 1 TABLET BY MOUTH TWICE A DAY 180 tablet 3  . ferrous sulfate 325 (65 FE) MG tablet Take 325 mg by mouth daily with breakfast.    . finasteride (PROSCAR) 5 MG tablet TAKE 1 TABLET BY MOUTH EVERY DAY 90 tablet 1  . fluticasone (FLONASE) 50 MCG/ACT nasal spray PLACE  1 SPRAY INTO BOTH NOSTRILS EVERY MORNING. 48 mL 1  . furosemide (LASIX) 80 MG tablet TAKE 1 TABLET BY MOUTH EVERY DAY 90 tablet 1  . Guaifenesin 1200 MG TB12 Take 1 tablet (1,200 mg total) by mouth 2 (two) times daily. (Patient taking differently: Take 1,200 mg by mouth 2 (two) times daily as needed (mucus).) 180 each 3  . KLOR-CON M20 20 MEQ tablet TAKE 1 TABLET BY MOUTH EVERY DAY 90 tablet 1  . lactulose (CHRONULAC) 10 GM/15ML solution TAKE 30 MLS BY MOUTH 2 TIMES DAILY AS NEEDED FOR MILD CONSTIPATION  OR MODERATE CONSTIPATION (Patient taking differently: Take 20 g by mouth 2 (two) times daily as needed for mild constipation.) 1892 mL 6  . lidocaine (XYLOCAINE) 5 % ointment Apply 1 application topically as needed. 240 g 2  . Multiple Vitamins-Minerals (ZINC PO) Take by mouth.    . Naloxone HCl (NARCAN NA) Place 1 application into the nose once.    . nystatin ointment (MYCOSTATIN) APPLY TO AFFECTED AREA TWICE A DAY 30 g 3  . oxyCODONE (ROXICODONE) 15 MG immediate release tablet Take 1 tablet (15 mg total) by mouth every 4 (four) hours as needed for pain. 150 tablet 0  . pantoprazole (PROTONIX) 20 MG tablet Take 1 tablet (20 mg total) by mouth 2 (two) times daily. 180 tablet 0  . predniSONE (DELTASONE) 10 MG tablet TAKE 2 TABLETS (20 MG TOTAL) BY MOUTH DAILY WITH BREAKFAST. 60 tablet 2  . rosuvastatin (CRESTOR) 40 MG tablet Take 1 tablet (40 mg total) by mouth daily. 90 tablet 3  . spironolactone (ALDACTONE) 25 MG tablet TAKE 1 TABLET BY MOUTH EVERY DAY 90 tablet 1  . sulfamethoxazole-trimethoprim (BACTRIM DS) 800-160 MG tablet Take 1 tablet by mouth 2 (two) times daily. 14 tablet 0  . SYMBICORT 160-4.5 MCG/ACT inhaler TAKE 2 PUFFS BY MOUTH TWICE A DAY 10.2 each 3  . tamsulosin (FLOMAX) 0.4 MG CAPS capsule TAKE 1 CAPSULE BY MOUTH EVERY DAY 90 capsule 1  . traZODone (DESYREL) 100 MG tablet TAKE 5 TABLETS BY MOUTH AT BEDTIME     No current facility-administered medications for this visit.     Allergies  Allergen Reactions  . Doxycycline   . Gabapentin Other (See Comments)    Pt states make him go down to the floor and can not get up  . Amlodipine Nausea And Vomiting and Other (See Comments)    dizziness  . Fish Allergy Nausea And Vomiting    STATES HE HAS NOT EATEN ANY FISH INCLUDING SHELLFISH FOR PAST 40 YRS - IT CAUSED NAUSEA  . Hydrocodone Itching and Rash  . Other Nausea And Vomiting    STATES HE HAS NOT EATEN ANY FISH INCLUDING SHELLFISH FOR PAST 40 YRS - IT CAUSED NAUSEA  . Tylenol [Acetaminophen] Other (See Comments)    Liver problems     REVIEW OF SYSTEMS:   [X]  denotes positive finding, [ ]  denotes negative finding Cardiac  Comments:  Chest pain or chest pressure:    Shortness of breath upon exertion:    Short of breath when lying flat:    Irregular heart rhythm:        Vascular    Pain in calf, thigh, or hip brought on by ambulation:    Pain in feet at night that wakes you up from your sleep:     Blood clot in your veins:    Leg swelling:         Pulmonary    Oxygen at home:    Productive cough:     Wheezing:         Neurologic    Sudden weakness in arms or legs:     Sudden numbness in arms or legs:     Sudden onset of difficulty speaking or slurred speech:    Temporary loss of vision in one eye:     Problems with dizziness:         Gastrointestinal    Blood in stool:     Vomited blood:         Genitourinary  Burning when urinating:     Blood in urine:        Psychiatric    Major depression:         Hematologic    Bleeding problems:    Problems with blood clotting too easily:        Skin    Rashes or ulcers:        Constitutional    Fever or chills:      PHYSICAL EXAMINATION:  Vitals:   07/18/20 1133  BP: 120/62  Pulse: 62  Resp: 20  Temp: 99 F (37.2 C)  TempSrc: Temporal  SpO2: 98%  Weight: 180 lb (81.6 kg)  Height: 5\' 10"  (1.778 m)    General:  WDWN in NAD; vital signs documented above Gait:  non-ambulatory HENT: WNL, normocephalic Pulmonary: normal non-labored breathing Cardiac: irregular HRAbdomen: soft, NT, no masses Skin: with rashes>> stasis dermatitis of right lower extremity. Vascular Exam/Pulses: Pedal pulses are not palpable.  He has brisk  dorsalis pedis, posterior tibial Doppler signals.  There are bilateral dampened peroneal signals present.  There is also signal at the base of the first and second toe webs. Extremities: with ischemic changes, without Gangrene , without cellulitis; with open wounds; toes.  The patient has 1-2+ lower extremity pitting edema.  There is an approximately 2 cm right medial malleolus ulcer.  Examination of his left great toe is difficult as he is limited in mobility.  I was able to photograph this and removed a small piece of silver alginate dressing from this wound.  There was a droplet of pus on the dressing.  I was unable to express pus from the great toe plantar surface. Musculoskeletal: + muscle wasting or atrophy  Neurologic: A&O X 3;  No focal weakness or paresthesias are detected Psychiatric:  The pt has Normal affect.  Right medial ankle    Left great toe   Silver alginate dressing   Left great toe     Non-Invasive Vascular Imaging:   07/18/2020 Right: Resting right ankle-brachial index indicates noncompressible right  lower extremity arteries. The right toe-brachial index is abnormal. ABIs  are unreliable. RT great toe pressure = 74 mmHg.   Left: Resting left ankle-brachial index indicates noncompressible left  lower extremity arteries. The left toe-brachial index is abnormal. ABIs  are unreliable. LT Great toe pressure = 0 mmHg.   ASSESSMENT/PLAN:: 80 y.o. male here for follow up for PAD.  The patient has a left great toe ulcer and noncompressible vessels.  No toe pressure obtained on the left great toe.  He is nonambulatory and has limited flexibility due to his history of cerebral palsy.  This makes treatment options  difficult.  I did discuss with him at length undergoing aortogram with or without intervention.  He is hesitant to proceed with this.  We were able to find a cancellation on Dr. Trula Slade scheduled on Monday and the patient prefers to discuss further treatment plans with Dr. Trula Slade on that day.  Continue lower extremity wound care for venous insufficiency as per wound care center.  Barbie Banner, PA-C [facility] [phone]  Clinic MD:   Oneida Alar

## 2020-07-18 NOTE — Telephone Encounter (Signed)
Per Dede Query PA-C, I have faxed over a referral for this patient with receipt of confirmation to [facility] (f) [phone].

## 2020-07-18 NOTE — Progress Notes (Signed)
[name], [name] ([id number]) Visit Report for 07/12/2020 Arrival Information Details Patient Name: Date of Service: [name] 07/12/2020 10:45 A M Medical Record Number: [id number] Patient Account Number: [id number] Date of Birth/Sex: Treating RN: 02-09-1941 (80 y.o. [name] Primary Care [name]: [name] Other Clinician: Referring [name]: Treating [name]/Extender: [name] in Treatment: 4 Visit Information History Since Last Visit Added or deleted any medications: No Patient Arrived: Wheel Chair Any new allergies or adverse reactions: No Arrival Time: 11:23 Had a fall or experienced change in No Transfer Assistance: [employer] activities of daily living that may affect Patient Identification Verified: Yes risk of falls: Secondary Verification Process Completed: Yes Signs or symptoms of abuse/neglect since last visito No Patient Requires Transmission-Based Precautions: No Hospitalized since last visit: No Patient Has Alerts: No Implantable device outside of the clinic excluding No cellular tissue based products placed in the center since last visit: Has Dressing in Place as Prescribed: Yes Pain Present Now: No Electronic Signature(s) Signed: 07/13/2020 6:16:52 PM By: [name] Entered By: [name] on 07/12/2020 11:24:18 -------------------------------------------------------------------------------- Compression Therapy Details Patient Name: Date of Service: [name], [name] 07/12/2020 10:45 A M Medical Record Number: [id number] Patient Account Number: [id number] Date of Birth/Sex: Treating RN: 1941-04-20 (80 y.o. [name] Primary Care Deion Forgue: [name] Other Clinician: Referring Pamila Mendibles: Treating Toini Failla/Extender: [name] in Treatment: 4 Compression Therapy Performed for Wound Assessment: Wound #1 Right,Medial Malleolus Performed By:  Clinician Brian Hammock, RN Compression Type: Three Layer Post Procedure Diagnosis Same as Pre-procedure Electronic Signature(s) Signed: 07/12/2020 4:43:39 PM By: [name] Entered By: [name] on 07/12/2020 11:53:01 -------------------------------------------------------------------------------- Compression Therapy Details Patient Name: Date of Service: [name], [name] 07/12/2020 10:45 A M Medical Record Number: [id number] Patient Account Number: [id number] Date of Birth/Sex: Treating RN: 01-Jan-1941 (80 y.o. [name] Primary Care Joann Jorge: [name] Other Clinician: Referring Harun Brumley: Treating Branae Crail/Extender: [name] in Treatment: 4 Compression Therapy Performed for Wound Assessment: Wound #4 Left,Dorsal Foot Performed By: Clinician Brian Hammock, RN Compression Type: Three Layer Post Procedure Diagnosis Same as Pre-procedure Electronic Signature(s) Signed: 07/12/2020 4:43:39 PM By: [name] Entered By: [name] on 07/12/2020 11:53:01 -------------------------------------------------------------------------------- Encounter Discharge Information Details Patient Name: Date of Service: Brian RNER, [name] 07/12/2020 10:45 A M Medical Record Number: [id number] Patient Account Number: [id number] Date of Birth/Sex: Treating RN: 13-Nov-1940 (80 y.o. [name], [name] Primary Care Bassam Dresch: [name] Other Clinician: Referring Jakylah Mcdowell: Treating [name]/Extender: [name] in Treatment: 4 Encounter Discharge Information Items Post Procedure Vitals Discharge Condition: Stable Temperature (F): 97.7 Ambulatory Status: Wheelchair Pulse (bpm): 74 Discharge Destination: Home Respiratory Rate (breaths/min): 17 Transportation: Other Blood Pressure (mmHg): 144/74 Accompanied By: self Schedule Follow-up Appointment: Yes Clinical Summary of Care: Patient  Declined Electronic Signature(s) Signed: 07/12/2020 5:01:28 PM By: Brian Hammock RN Entered By: [name] on 07/12/2020 12:18:02 -------------------------------------------------------------------------------- Lower Extremity Assessment Details Patient Name: Date of Service: Brian RNER, [name] 07/12/2020 10:45 A M Medical Record Number: [id number] Patient Account Number: [id number] Date of Birth/Sex: Treating RN: 1940/09/29 (80 y.o. [name] Primary Care [name]: [name] Other Clinician: Referring Kalina Morabito: Treating [name]/Extender: [name] in Treatment: 4 Edema Assessment Assessed: [name]: Yes] [name]: Yes] Edema: [Left: Yes] [Right: Yes] Calf Left: Right: Point of Measurement: 29 cm From Medial Instep 26 cm 25 cm Ankle Left: Right: Point of Measurement: 10 cm From Medial  Instep 20 cm 20.5 cm Knee To Floor Left: Right: From Medial Instep 47 cm Vascular Assessment Pulses: Dorsalis Pedis Palpable: [Left:No] [Right:No] Electronic Signature(s) Signed: 07/12/2020 4:43:39 PM By: [name] Signed: 07/13/2020 6:16:52 PM By: [name] Entered By: [name] on 07/12/2020 11:53:24 -------------------------------------------------------------------------------- Multi Wound Chart Details Patient Name: Date of Service: Brian RNER, [name] 07/12/2020 10:45 A M Medical Record Number: [id number] Patient Account Number: [id number] Date of Birth/Sex: Treating RN: 12/17/1940 (80 y.o. [name], [name] Primary Care [name]: [name] Other Clinician: Referring [name]: Treating [name]/Extender: [name] in Treatment: 4 Vital Signs Height(in): 77 Pulse(bpm): 26 Weight(lbs): 178 Blood Pressure(mmHg): 137/61 Body Mass Index(BMI): 25 Temperature(F): 97.8 Respiratory Rate(breaths/min): 18 Photos: [1:No Photos Right, Medial Malleolus] [10:No Photos Left,  Lateral Foot] [11:No Photos Left T Great oe] Wound Location: [1:Gradually Appeared] [10:Gradually Appeared] [11:Gradually Appeared] Wounding Event: [1:Venous Leg Ulcer] [10:Lymphedema] [11:Lymphedema] Primary Etiology: [1:Lymphedema] [10:N/A] [11:N/A] 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, Chronic Obstructive] [10:Anemia, Chronic Obstructive] [11:Anemia, Chronic Obstructive] Comorbid History: [1:Pulmonary Disease (COPD), Arrhythmia, Congestive Heart Failure, Arrhythmia, Congestive Heart Failure, Arrhythmia, Congestive Heart Failure, Coronary Artery Disease, Hypertension, Cirrhosis , Osteoarthritis Hypertension, Cirrhosis ,  Osteoarthritis Hypertension, Cirrhosis , Osteoarthritis 11/20/2019] [10:Pulmonary Disease (COPD), Coronary Artery Disease, 06/26/2020] [11:Pulmonary Disease (COPD), Coronary Artery Disease, 06/26/2020] Date Acquired: [1:4] [10:2] [11:2] Weeks of Treatment: [1:Open] [10:Healed - Epithelialized] [11:Open] Wound Status: [1:0.7x1.1x0.2] [10:0x0x0] [11:0.7x0.6x0.1] Measurements L x W x D (cm) [1:0.605] [10:0] [11:0.33] A (cm) : rea [1:0.121] [10:0] [11:0.033] Volume (cm) : [1:94.90%] [10:100.00%] [11:-317.70%] % Reduction in Area: [1:89.90%] [10:100.00%] [11:-312.50%] % Reduction in Volume: [1:Full Thickness Without Exposed] [10:Full Thickness Without Exposed] [11:Full Thickness Without Exposed] Classification: [1:Support Structures Medium] [10:Support Structures N/A] [11:Support Structures Medium] Exudate Amount: [1:Serosanguineous] [10:N/A] [11:Serosanguineous] Exudate Type: [1:red, brown] [10:N/A] [11:red, brown] Exudate Color: [1:Well defined, not attached] [10:N/A] [11:Distinct, outline attached] Wound Margin: [1:Medium (34-66%)] [10:N/A] [11:Medium (34-66%)] Granulation Amount: [1:Red] [10:N/A] [11:Red, Pink] Granulation Quality: [1:Medium (34-66%)] [10:N/A] [11:Small (1-33%)] Necrotic Amount: [1:Fat Layer (Subcutaneous Tissue): Yes N/A] [11:Fat Layer (Subcutaneous Tissue):  Yes] Exposed Structures: [1:Fascia: No Tendon: No Muscle: No Joint: No Bone: No Medium (34-66%)] [10:N/A] [11:Fascia: No Tendon: No Muscle: No Joint: No Bone: No None] Epithelialization: [1:N/A] [10:N/A] [11:Debridement - Excisional] Debridement: Pre-procedure Verification/Time Out N/A [10:N/A] [11:11:41] Taken: [1:N/A] [10:N/A] [11:Lidocaine 4% Topical Solution] Pain Control: [1:N/A] [10:N/A] [11:Subcutaneous, Slough] Tissue Debrided: [1:N/A] [10:N/A] [11:Skin/Subcutaneous Tissue] Level: [1:N/A] [10:N/A] [11:0.42] Debridement A (sq cm): [1:rea N/A] [10:N/A] [11:Curette] Instrument: [1:N/A] [10:N/A] [11:Minimum] Bleeding: [1:N/A] [10:N/A] [11:Pressure] Hemostasis A chieved: [1:N/A] [10:N/A] [11:0] Procedural Pain: [1:N/A] [10:N/A] [11:0] Post Procedural Pain: [1:N/A] [10:N/A] [11:Procedure was tolerated well] Debridement Treatment Response: [1:N/A] [10:N/A] [11:0.7x0.6x0.1] Post Debridement Measurements L x W x D (cm) [1:N/A] [10:N/A] [11:0.033] Post Debridement Volume: (cm) [1:Compression Therapy] [10:N/A] [11:Debridement] Wound Number: 2 4 6  Photos: No Photos No Photos No Photos Right, Posterior Lower Leg Left, Dorsal Foot Left, Posterior Lower Leg Wound Location: Gradually Appeared Blister Gradually Appeared Wounding Event: Venous Leg Ulcer Lymphedema Lymphedema Primary Etiology: Lymphedema N/A Venous Leg Ulcer Secondary Etiology: Anemia, Chronic Obstructive Anemia, Chronic Obstructive Anemia, Chronic Obstructive Comorbid History: Pulmonary Disease (COPD), Pulmonary Disease (COPD), Pulmonary Disease (COPD), Arrhythmia, Congestive Heart Failure, Arrhythmia, Congestive Heart Failure, Arrhythmia, Congestive Heart Failure, Coronary Artery Disease, Coronary Artery Disease, Coronary Artery Disease, Hypertension, Cirrhosis , Osteoarthritis Hypertension, Cirrhosis , Osteoarthritis Hypertension, Cirrhosis , Osteoarthritis 11/20/2019 11/20/2019 11/20/2019 Date A cquired: 4 4 4  Weeks of  Treatment: Healed - Epithelialized Open Healed - Epithelialized Wound Status: 0x0x0 0.7x0.2x0.1 0x0x0 Measurements L  x W x D (cm) 0 0.11 0 A (cm) : rea 0 0.011 0 Volume (cm) : 100.00% 92.30% 100.00% % Reduction in A rea: 100.00% 92.30% 100.00% % Reduction in Volume: Full Thickness Without Exposed Full Thickness Without Exposed Full Thickness Without Exposed Classification: Support Structures Support Structures Support Structures N/A Medium N/A Exudate A mount: N/A Serosanguineous N/A Exudate Type: N/A red, brown N/A Exudate Color: N/A Distinct, outline attached N/A Wound Margin: N/A Large (67-100%) N/A Granulation A mount: N/A Pink N/A Granulation Quality: N/A Small (1-33%) N/A Necrotic A mount: N/A Fat Layer (Subcutaneous Tissue): Yes N/A Exposed Structures: Fascia: No Tendon: No Muscle: No Joint: No Bone: No N/A Large (67-100%) N/A Epithelialization: N/A N/A N/A Debridement: N/A N/A N/A Pain Control: N/A N/A N/A Tissue Debrided: N/A N/A N/A Level: N/A N/A N/A Debridement A (sq cm): rea N/A N/A N/A Instrument: N/A N/A N/A Bleeding: N/A N/A N/A Hemostasis A chieved: N/A N/A N/A Procedural Pain: N/A N/A N/A Post Procedural Pain: Debridement Treatment Response: N/A N/A N/A Post Debridement Measurements L x N/A N/A N/A W x D (cm) N/A N/A N/A Post Debridement Volume: (cm) N/A Compression Therapy N/A Procedures Performed: Wound Number: 9 N/A N/A Photos: No Photos N/A N/A Left, Proximal, Posterior Upper Leg N/A N/A Wound Location: Pressure Injury N/A N/A Wounding Event: Pressure Ulcer N/A N/A Primary Etiology: N/A N/A N/A Secondary Etiology: Anemia, Chronic Obstructive N/A N/A Comorbid History: Pulmonary Disease (COPD), Arrhythmia, Congestive Heart Failure, Coronary Artery Disease, Hypertension, Cirrhosis , Osteoarthritis 05/23/2019 N/A N/A Date A cquired: 4 N/A N/A Weeks of Treatment: Healed - Epithelialized N/A N/A Wound  Status: 0x0x0 N/A N/A Measurements L x W x D (cm) 0 N/A N/A A (cm) : rea 0 N/A N/A Volume (cm) : 100.00% N/A N/A % Reduction in A rea: 100.00% N/A N/A % Reduction in Volume: Category/Stage II N/A N/A Classification: N/A N/A N/A Exudate A mount: N/A N/A N/A Exudate Type: N/A N/A N/A Exudate Color: N/A N/A N/A Wound Margin: N/A N/A N/A Granulation A mount: N/A N/A N/A Granulation Quality: N/A N/A N/A Necrotic A mount: N/A N/A N/A Exposed Structures: N/A N/A N/A Epithelialization: N/A N/A N/A Debridement: N/A N/A N/A Pain Control: N/A N/A N/A Tissue Debrided: N/A N/A N/A Level: N/A N/A N/A Debridement A (sq cm): rea N/A N/A N/A Instrument: N/A N/A N/A Bleeding: N/A N/A N/A Hemostasis A chieved: N/A N/A N/A Procedural Pain: N/A N/A N/A Post Procedural Pain: N/A N/A N/A Debridement Treatment Response: N/A N/A N/A Post Debridement Measurements L x W x D (cm) N/A N/A N/A Post Debridement Volume: (cm) N/A N/A N/A Procedures Performed: Treatment Notes Electronic Signature(s) Signed: 07/12/2020 4:40:49 PM By: Linton Ham MD Signed: 07/12/2020 4:43:39 PM By: [name] Entered By: Linton Ham on 07/12/2020 12:07:51 -------------------------------------------------------------------------------- Multi-Disciplinary Care Plan Details Patient Name: Date of Service: Brian RNER, [name] 07/12/2020 10:45 A M Medical Record Number: [id number] Patient Account Number: [id number] Date of Birth/Sex: Treating RN: 12/11/1940 (80 y.o. [name] Primary Care Scottlyn Mchaney: [name] Other Clinician: Referring Layman Gully: Treating Kawena Lyday/Extender: [name] in Treatment: 4 Active Inactive Wound/Skin Impairment Nursing Diagnoses: Impaired tissue integrity Knowledge deficit related to ulceration/compromised skin integrity Goals: Patient will have a decrease in wound volume by X% from date: (specify in  notes) Date Initiated: 06/12/2020 Target Resolution Date: 08/17/2020 Goal Status: Active Patient/caregiver will verbalize understanding of skin care regimen Date Initiated: 06/12/2020 Target Resolution Date: 07/20/2020 Goal Status: Active Ulcer/skin breakdown will have a volume reduction  of 30% by week 4 Date Initiated: 06/12/2020 Date Inactivated: 07/05/2020 Target Resolution Date: 06/29/2020 Unmet Reason: see wound Unmet Reason: see wound Goal Status: Unmet measurements. Interventions: Assess patient/caregiver ability to obtain necessary supplies Assess patient/caregiver ability to perform ulcer/skin care regimen upon admission and as needed Assess ulceration(s) every visit Notes: Electronic Signature(s) Signed: 07/12/2020 4:43:39 PM By: [name] Entered By: [name] on 07/12/2020 10:45:47 -------------------------------------------------------------------------------- Pain Assessment Details Patient Name: Date of Service: [name], [name] 07/12/2020 10:45 A M Medical Record Number: [id number] Patient Account Number: [id number] Date of Birth/Sex: Treating RN: 1940/07/18 (80 y.o. [name] Primary Care Talesha Ellithorpe: [name] Other Clinician: Referring Ireene Ballowe: Treating Kyro Joswick/Extender: [name] in Treatment: 4 Active Problems Location of Pain Severity and Description of Pain Patient Has Paino Yes Site Locations Pain Location: Pain in Ulcers With Dressing Change: Yes Duration of the Pain. Constant / Intermittento Intermittent Rate the pain. Current Pain Level: 7 Character of Pain Describe the Pain: Burning, Tender Pain Management and Medication Current Pain Management: Medication: Yes Cold Application: No Rest: Yes Massage: No Activity: No T.McdowellN.S.: No Heat Application: No Leg drop or elevation: No Is the Current Pain Management Adequate: Inadequate How does your wound impact your activities of daily  livingo Sleep: Yes Bathing: No Appetite: No Relationship With Others: No Bladder Continence: No Emotions: No Bowel Continence: No Work: No Toileting: No Drive: No Dressing: No Hobbies: No Electronic Signature(s) Signed: 07/13/2020 6:16:52 PM By: [name] Entered By: [name] on 07/12/2020 11:25:55 -------------------------------------------------------------------------------- Patient/Caregiver Education Details Patient Name: Date of Service: Brian RNER, [place]. 3/24/2022andnbsp10:45 [place] Record Number: [id number] Patient Account Number: [id number] Date of Birth/Gender: Treating RN: 04/02/1941 (80 y.o. [name] Primary Care Physician: [name] Other Clinician: Referring Physician: Treating Physician/Extender: [name] in Treatment: 4 Education Assessment Education Provided To: Patient Education Topics Provided Wound/Skin Impairment: Handouts: Caring for Your Ulcer Methods: Explain/Verbal Responses: Reinforcements needed Electronic Signature(s) Signed: 07/12/2020 4:43:39 PM By: [name] Entered By: [name] on 07/12/2020 10:46:05 -------------------------------------------------------------------------------- Wound Assessment Details Patient Name: Date of Service: [name], [name] 07/12/2020 10:45 A M Medical Record Number: [id number] Patient Account Number: [id number] Date of Birth/Sex: Treating RN: [birth year] (80 y.o. [name] Primary Care Kemauri Musa: [name] Other Clinician: Referring Nikol Lemar: Treating Librada Castronovo/Extender: [name] in Treatment: 4 Wound Status Wound Number: 1 Primary Venous Leg Ulcer Etiology: Wound Location: Right, Medial Malleolus Secondary Lymphedema Wounding Event: Gradually Appeared Etiology: Date Acquired: 11/20/2019 Wound Open Weeks Of Treatment: 4 Status: Clustered Wound: No Comorbid Anemia,  Chronic Obstructive Pulmonary Disease (COPD), History: Arrhythmia, Congestive Heart Failure, Coronary Artery Disease, Hypertension, Cirrhosis , Osteoarthritis Photos Wound Measurements Length: (cm) 0.7 Width: (cm) 1.1 Depth: (cm) 0.2 Area: (cm) 0.605 Volume: (cm) 0.121 % Reduction in Area: 94.9% % Reduction in Volume: 89.9% Epithelialization: Medium (34-66%) Tunneling: No Undermining: No Wound Description Classification: Full Thickness Without Exposed Support Structures Wound Margin: Well defined, not attached Exudate Amount: Medium Exudate Type: Serosanguineous Exudate Color: red, brown Foul Odor After Cleansing: No Slough/Fibrino Yes Wound Bed Granulation Amount: Medium (34-66%) Exposed Structure Granulation Quality: Red Fascia Exposed: No Necrotic Amount: Medium (34-66%) Fat Layer (Subcutaneous Tissue) Exposed: Yes Necrotic Quality: Adherent Slough Tendon Exposed: No Muscle Exposed: No Joint Exposed: No Bone Exposed: No Treatment Notes Wound #1 (Malleolus) Wound Laterality: Right, Medial Cleanser Wound Cleanser Discharge Instruction: Cleanse the wound with wound cleanser prior to applying a clean dressing using  gauze sponges, not tissue or cotton balls. Soap and Water Discharge Instruction: May shower and wash wound with dial antibacterial soap and water prior to dressing change. Peri-Wound Care Topical Primary Dressing KerraCel Ag Gelling Fiber Dressing, 4x5 in (silver alginate) Discharge Instruction: Apply silver alginate to wound bed as instructed Secondary Dressing ABD Pad, 5x9 Discharge Instruction: Apply over primary dressing as directed. Secured With Compression Wrap ThreePress (3 layer compression wrap) Discharge Instruction: Apply LIGHTY three layer compression as directed. Compression Stockings Add-Ons Electronic Signature(s) Signed: 07/13/2020 6:16:52 PM By: [name] Signed: 07/18/2020 9:05:34 AM By: Sandre Kitty Entered By: Sandre Kitty on 07/12/2020 16:42:08 -------------------------------------------------------------------------------- Wound Assessment Details Patient Name: Date of Service: Brian RNER, [name] 07/12/2020 10:45 A M Medical Record Number: [id number] Patient Account Number: [id number] Date of Birth/Sex: Treating RN: June 21, 1940 (80 y.o. [name] Primary Care Torianna Junio: [name] Other Clinician: Referring Glenice Ciccone: Treating Rawn Quiroa/Extender: [name] in Treatment: 4 Wound Status Wound Number: 10 Primary Lymphedema Etiology: Wound Location: Left, Lateral Foot Wound Healed - Epithelialized Wounding Event: Gradually Appeared Status: Date Acquired: 06/26/2020 Comorbid Anemia, Chronic Obstructive Pulmonary Disease (COPD), Weeks Of Treatment: 2 History: Arrhythmia, Congestive Heart Failure, Coronary Artery Disease, Clustered Wound: No Hypertension, Cirrhosis , Osteoarthritis Wound Measurements Length: (cm) Width: (cm) Depth: (cm) Area: (cm) Volume: (cm) 0 % Reduction in Area: 100% 0 % Reduction in Volume: 100% 0 0 0 Wound Description Classification: Full Thickness Without Exposed Support Structur es Electronic Signature(s) Signed: 07/13/2020 6:16:52 PM By: [name] Entered By: [name] on 07/12/2020 11:28:25 -------------------------------------------------------------------------------- Wound Assessment Details Patient Name: Date of Service: Brian RNER, [name] 07/12/2020 10:45 A M Medical Record Number: [id number] Patient Account Number: [id number] Date of Birth/Sex: Treating RN: 1941/04/15 (80 y.o. [name] Primary Care Luretha Eberly: [name] Other Clinician: Referring Arpi Diebold: Treating Kamon Fahr/Extender: [name] in Treatment: 4 Wound Status Wound Number: 11 Primary Lymphedema Etiology: Wound Location: Left T Great oe Wound Open Wounding Event: Gradually  Appeared Status: Date Acquired: 06/26/2020 Comorbid Anemia, Chronic Obstructive Pulmonary Disease (COPD), Weeks Of Treatment: 2 History: Arrhythmia, Congestive Heart Failure, Coronary Artery Disease, Clustered Wound: No Hypertension, Cirrhosis , Osteoarthritis Photos Wound Measurements Length: (cm) 0.7 Width: (cm) 0.6 Depth: (cm) 0.1 Area: (cm) 0.33 Volume: (cm) 0.033 % Reduction in Area: -317.7% % Reduction in Volume: -312.5% Epithelialization: None Tunneling: No Undermining: No Wound Description Classification: Full Thickness Without Exposed Support Structures Wound Margin: Distinct, outline attached Exudate Amount: Medium Exudate Type: Serosanguineous Exudate Color: red, brown Foul Odor After Cleansing: No Slough/Fibrino Yes Wound Bed Granulation Amount: Medium (34-66%) Exposed Structure Granulation Quality: Red, Pink Fascia Exposed: No Necrotic Amount: Small (1-33%) Fat Layer (Subcutaneous Tissue) Exposed: Yes Necrotic Quality: Adherent Slough Tendon Exposed: No Muscle Exposed: No Joint Exposed: No Bone Exposed: No Treatment Notes Wound #11 (Toe Great) Wound Laterality: Left Cleanser Wound Cleanser Discharge Instruction: Cleanse the wound with wound cleanser prior to applying a clean dressing using gauze sponges, not tissue or cotton balls. Soap and Water Discharge Instruction: May shower and wash wound with dial antibacterial soap and water prior to dressing change. Peri-Wound Care Topical Primary Dressing KerraCel Ag Gelling Fiber Dressing, 4x5 in (silver alginate) Discharge Instruction: Apply silver alginate to wound bed as instructed Secondary Dressing Woven Gauze Sponges 2x2 in Discharge Instruction: Apply over primary dressing as directed. Secured With Conforming Stretch Gauze Bandage, Sterile 2x75 (in/in) Discharge Instruction: Secure with stretch gauze as directed. Paper Tape, 2x10 (in/yd) Discharge Instruction: Secure dressing  with tape as  directed. Compression Wrap Compression Stockings Add-Ons Electronic Signature(s) Signed: 07/13/2020 6:16:52 PM By: [name] Signed: 07/18/2020 9:05:34 AM By: Sandre Kitty Entered By: Sandre Kitty on 07/12/2020 16:42:27 -------------------------------------------------------------------------------- Wound Assessment Details Patient Name: Date of Service: Brian RNER, [name] 07/12/2020 10:45 A M Medical Record Number: [id number] Patient Account Number: [id number] Date of Birth/Sex: Treating RN: 1940/10/31 (80 y.o. [name] Primary Care Milka Windholz: [name] Other Clinician: Referring Crystal Scarberry: Treating Thadius Smisek/Extender: [name] in Treatment: 4 Wound Status Wound Number: 2 Primary Venous Leg Ulcer Etiology: Wound Location: Right, Posterior Lower Leg Secondary Lymphedema Wounding Event: Gradually Appeared Etiology: Date Acquired: 11/20/2019 Wound Healed - Epithelialized Weeks Of Treatment: 4 Status: Clustered Wound: No Comorbid Anemia, Chronic Obstructive Pulmonary Disease (COPD), History: Arrhythmia, Congestive Heart Failure, Coronary Artery Disease, Hypertension, Cirrhosis , Osteoarthritis Wound Measurements Length: (cm) Width: (cm) Depth: (cm) Area: (cm) Volume: (cm) 0 % Reduction in Area: 100% 0 % Reduction in Volume: 100% 0 0 0 Wound Description Classification: Full Thickness Without Exposed Support Structur es Electronic Signature(s) Signed: 07/13/2020 6:16:52 PM By: [name] Entered By: [name] on 07/12/2020 11:31:50 -------------------------------------------------------------------------------- Wound Assessment Details Patient Name: Date of Service: Brian RNER, [name] 07/12/2020 10:45 A M Medical Record Number: [id number] Patient Account Number: [id number] Date of Birth/Sex: Treating RN: 04-Sep-1940 (79 y.o. [name] Primary Care Kairyn Olmeda: [name] Other  Clinician: Referring Amilio Zehnder: Treating Lissett Favorite/Extender: [name] in Treatment: 4 Wound Status Wound Number: 4 Primary Lymphedema Etiology: Wound Location: Left, Dorsal Foot Wound Open Wounding Event: Blister Status: Date Acquired: 11/20/2019 Comorbid Anemia, Chronic Obstructive Pulmonary Disease (COPD), Weeks Of Treatment: 4 History: Arrhythmia, Congestive Heart Failure, Coronary Artery Disease, Clustered Wound: No Hypertension, Cirrhosis , Osteoarthritis Photos Wound Measurements Length: (cm) 0.7 Width: (cm) 0.2 Depth: (cm) 0.1 Area: (cm) 0.11 Volume: (cm) 0.011 % Reduction in Area: 92.3% % Reduction in Volume: 92.3% Epithelialization: Large (67-100%) Tunneling: No Undermining: No Wound Description Classification: Full Thickness Without Exposed Support Structures Wound Margin: Distinct, outline attached Exudate Amount: Medium Exudate Type: Serosanguineous Exudate Color: red, brown Foul Odor After Cleansing: No Slough/Fibrino Yes Wound Bed Granulation Amount: Large (67-100%) Exposed Structure Granulation Quality: Pink Fascia Exposed: No Necrotic Amount: Small (1-33%) Fat Layer (Subcutaneous Tissue) Exposed: Yes Necrotic Quality: Adherent Slough Tendon Exposed: No Muscle Exposed: No Joint Exposed: No Bone Exposed: No Treatment Notes Wound #4 (Foot) Wound Laterality: Dorsal, Left Cleanser Wound Cleanser Discharge Instruction: Cleanse the wound with wound cleanser prior to applying a clean dressing using gauze sponges, not tissue or cotton balls. Soap and Water Discharge Instruction: May shower and wash wound with dial antibacterial soap and water prior to dressing change. Peri-Wound Care Topical Primary Dressing KerraCel Ag Gelling Fiber Dressing, 4x5 in (silver alginate) Discharge Instruction: Apply silver alginate to wound bed as instructed Secondary Dressing ABD Pad, 5x9 Discharge Instruction: Apply over primary  dressing as directed. Secured With Compression Wrap ThreePress (3 layer compression wrap) Discharge Instruction: Apply LIGHTY three layer compression as directed. Compression Stockings Add-Ons Electronic Signature(s) Signed: 07/13/2020 6:16:52 PM By: [name] Signed: 07/18/2020 9:05:34 AM By: Sandre Kitty Entered By: Sandre Kitty on 07/12/2020 16:41:47 -------------------------------------------------------------------------------- Wound Assessment Details Patient Name: Date of Service: Brian RNER, [name] 07/12/2020 10:45 A M Medical Record Number: [id number] Patient Account Number: [id number] Date of Birth/Sex: Treating RN: [birth year] (80 y.o. [name] Primary Care Kynzee Devinney: [name] Other Clinician: Referring Ronee Ranganathan: Treating Jolleen Seman/Extender: [name] in  Treatment: 4 Wound Status Wound Number: 6 Primary Lymphedema Etiology: Wound Location: Left, Posterior Lower Leg Secondary Venous Leg Ulcer Wounding Event: Gradually Appeared Etiology: Date Acquired: 11/20/2019 Wound Healed - Epithelialized Weeks Of Treatment: 4 Status: Clustered Wound: No Comorbid Anemia, Chronic Obstructive Pulmonary Disease (COPD), History: Arrhythmia, Congestive Heart Failure, Coronary Artery Disease, Hypertension, Cirrhosis , Osteoarthritis Wound Measurements Length: (cm) Width: (cm) Depth: (cm) Area: (cm) Volume: (cm) 0 % Reduction in Area: 100% 0 % Reduction in Volume: 100% 0 0 0 Wound Description Classification: Full Thickness Without Exposed Support Structur es Electronic Signature(s) Signed: 07/13/2020 6:16:52 PM By: [name] Entered By: [name] on 07/12/2020 11:32:38 -------------------------------------------------------------------------------- Wound Assessment Details Patient Name: Date of Service: Brian RNER, [name] 07/12/2020 10:45 A M Medical Record Number: [id number] Patient Account  Number: [id number] Date of Birth/Sex: Treating RN: May 30, 1940 (80 y.o. [name] Primary Care Zayah Keilman: [name] Other Clinician: Referring Loveta Dellis: Treating Aneesh Faller/Extender: [name] in Treatment: 4 Wound Status Wound Number: 9 Primary Pressure Ulcer Etiology: Wound Location: Left, Proximal, Posterior Upper Leg Wound Healed - Epithelialized Wounding Event: Pressure Injury Status: Date Acquired: 05/23/2019 Comorbid Anemia, Chronic Obstructive Pulmonary Disease (COPD), Weeks Of Treatment: 4 History: Arrhythmia, Congestive Heart Failure, Coronary Artery Disease, Clustered Wound: No Hypertension, Cirrhosis , Osteoarthritis Wound Measurements Length: (cm) Width: (cm) Depth: (cm) Area: (cm) Volume: (cm) 0 % Reduction in Area: 100% 0 % Reduction in Volume: 100% 0 0 0 Wound Description Classification: Category/Stage II Electronic Signature(s) Signed: 07/13/2020 6:16:52 PM By: [name] Entered By: [name] on 07/12/2020 11:33:05 -------------------------------------------------------------------------------- Vitals Details Patient Name: Date of Service: Brian RNER, [name] 07/12/2020 10:45 A M Medical Record Number: [id number] Patient Account Number: [id number] Date of Birth/Sex: Treating RN: 18-Oct-1940 (80 y.o. [name] Primary Care Alaura Schippers: [name] Other Clinician: Referring Dimitriy Carreras: Treating Jefferie Holston/Extender: [name] in Treatment: 4 Vital Signs Time Taken: 11:20 Temperature (F): 97.8 Height (in): 71 Pulse (bpm): 71 Weight (lbs): 178 Respiratory Rate (breaths/min): 18 Body Mass Index (BMI): 24.8 Blood Pressure (mmHg): 137/61 Reference Range: 80 - 120 mg / dl Electronic Signature(s) Signed: 07/13/2020 6:16:52 PM By: [name] Entered By: [name] on 07/12/2020 11:24:50

## 2020-07-18 NOTE — Telephone Encounter (Signed)
Unable to get in contact with the patient. LDVM that his hospital bed order has been faxed to [facility]. Office number was provided in case he has additional questions or concerns.

## 2020-07-18 NOTE — Telephone Encounter (Signed)
I called [name] today to review the remaining labs from our visit on 07/10/2020.  In addition to iron deficiency anemia, there is evidence of an M spike at a concentration of 0.2 g/dL.  I explained that we will further work-up abnormal protein level at his return visit.  Patient received his first dose of IV Venofer yesterday and will plan to have the remaining 4 infusions weekly.  He has yet to see GI to further evaluate the cause of the iron deficiency anemia in the setting of patient noticing black stools and having cirrhosis.  Our office will reach the GI team to expedite the consultation.  Patient expressed understanding of the plan and was in agreement.  He has no other questions or concerns.

## 2020-07-19 ENCOUNTER — Encounter: Payer: Self-pay | Admitting: Nurse Practitioner

## 2020-07-19 ENCOUNTER — Encounter (HOSPITAL_BASED_OUTPATIENT_CLINIC_OR_DEPARTMENT_OTHER): Payer: Medicare Other | Admitting: Internal Medicine

## 2020-07-19 ENCOUNTER — Other Ambulatory Visit: Payer: Self-pay

## 2020-07-19 DIAGNOSIS — I509 Heart failure, unspecified: Secondary | ICD-10-CM | POA: Diagnosis not present

## 2020-07-19 DIAGNOSIS — I110 Hypertensive heart disease with heart failure: Secondary | ICD-10-CM | POA: Diagnosis not present

## 2020-07-19 DIAGNOSIS — L97528 Non-pressure chronic ulcer of other part of left foot with other specified severity: Secondary | ICD-10-CM | POA: Diagnosis not present

## 2020-07-19 DIAGNOSIS — L97822 Non-pressure chronic ulcer of other part of left lower leg with fat layer exposed: Secondary | ICD-10-CM | POA: Diagnosis not present

## 2020-07-19 DIAGNOSIS — I890 Lymphedema, not elsewhere classified: Secondary | ICD-10-CM | POA: Diagnosis not present

## 2020-07-19 DIAGNOSIS — I87333 Chronic venous hypertension (idiopathic) with ulcer and inflammation of bilateral lower extremity: Secondary | ICD-10-CM | POA: Diagnosis not present

## 2020-07-19 DIAGNOSIS — L97228 Non-pressure chronic ulcer of left calf with other specified severity: Secondary | ICD-10-CM | POA: Diagnosis not present

## 2020-07-19 DIAGNOSIS — L97522 Non-pressure chronic ulcer of other part of left foot with fat layer exposed: Secondary | ICD-10-CM | POA: Diagnosis not present

## 2020-07-19 DIAGNOSIS — L97312 Non-pressure chronic ulcer of right ankle with fat layer exposed: Secondary | ICD-10-CM | POA: Diagnosis not present

## 2020-07-19 DIAGNOSIS — L97818 Non-pressure chronic ulcer of other part of right lower leg with other specified severity: Secondary | ICD-10-CM | POA: Diagnosis not present

## 2020-07-19 NOTE — Progress Notes (Signed)
[name] ([id number]) Visit Report for 07/19/2020 HPI Details Patient Name: Date of Service: [name] 07/19/2020 1:15 PM Medical Record Mcdowell: [id number] Patient Account Mcdowell: [id number] Date of Birth/Sex: Treating RN: April 15, 1941 (80 y.o. [name] Primary Care Provider: Pricilla Mcdowell Other Clinician: Referring Provider: Treating Provider/Extender: [name] in Treatment: 5 History of Present Illness HPI Description: ADMISSION 06/12/2020 This is a 80 year old man who has had wounds on his bilateral lower legs and feet for several months now. Looking in epic he saw his primary physician and early November at which time he had an ulcer of the right calf. He was seen again by his primary doctor on 05/12/2020 at which time he had bilateral lower extremity ulcers. He was reviewed by Dr. Trula Mcdowell of vein and vascular in the spring 2021. He had arterial studies done on 08/12/2019 showing an ABI on the right and 0.74 on the left and 0.69. TBI of the right of 0.29 on the left 0.35 with monophasic waveforms bilaterally. He also had venous reflux studies on 09/29/2019 that did not show evidence of DVT or SVT bilaterally. In the right he had superficial vein reflux in the SSV and mid thigh AASV greater saphenous vein had previously been harvested. On the left he did not have any superficial vein reflux but it was noted that he had interstitial fluid without throughout the lower extremity. He also has a history of CABG, congestive heart failure followed by Dr. Stanford Mcdowell and apparently liver cirrhosis. He takes Lasix 80 mg as well as spironolactone. He arrives in clinic today with 9 wounds. Most of these are superficial and look like they started with blisters. He has areas on the left dorsal foot x2, left posterior calf left anterior tibial left posterior thigh x2 the right medial lower extremity and the right medial malleolus. He has pitting  edema extending well up into his thighs skin changes look like chronic stasis dermatitis. Dr. Trula Mcdowell saw him in May noted that he had tolerated Unna boots and was feeling better. Also noted the moderate arterial insufficiency and suggested if he developed nonhealing wounds then he may need attempted angiography. Past medical history extensive including multiple left hip surgeries, congestive heart failure, cirrhosis, peripheral arterial disease, chronic atrial fibrillation, recurrent cellulitis of the legs, AAA, carotid stenosis, COPD, hypertension and hyperlipidemia We did not attempt his arterial ABIs in our clinic today because of swelling 3/1; this is a patient I admitted to the clinic last week he has multiple wounds on his bilateral lower extremities. I think he has chronic venous insufficiency and PAD. He tolerated the "light" 3 layer wrap I put him on last week and the edema control is better. 06/26/2020 upon evaluation today patient appears to be doing well with regard to his legs overall. He does have several areas of blistering but to be perfectly honest this seems to be making some progress here. Fortunately there is no signs of active infection which is great news. No fevers, chills, nausea, vomiting, or diarrhea. He does have a lot of callus over the great toe that is going require some sharp debridement. I discussed that with him today however I am that I do this extremely likely considering the fact that he does appear to potentially have some peripheral vascular disease specifically of the arterial type. Nonetheless he does have a an appointment on March 30 for formal arterial studies. 3/17; bilateral lower extremity wounds in the setting of chronic venous insufficiency  and arterial insufficiency. He has a follow-up with [name] later this month and he is going to have follow-up noninvasive studies. His renal studies done in April 2021 were really very poor with ABIs of 0.74 and  0.69. TBI's less than 0.3 bilateral 3/24; patient has follow-up with [name] on March 30. In spite of the arterial studies which are really not very good he is tolerated a light 3 layer compression and we have good edema control. As a result of this he has had healing on the area on the left lateral thigh the posterior wounds on his calves bilaterally are healed. He still has an area on the left great toe right medial ankle 3/31; the patient saw vein and vascular. Arterial studies showed an ABI on the right noncompressible with a TBI of 0.56 on the left he was also noncompressible but a great toe pressure of 0. There was some talk about him undergoing angiography early next week but I do not think the patient was ready for that information. I have told him that this attempt is absolutely necessary. He arrives in clinic today having not had compression on his legs since yesterday. Large tight blisters on the left anterior lower leg less left dorsal foot and 2 smaller areas on the right Electronic Signature(s) Signed: 07/19/2020 5:43:02 PM By: Brian Ham MD Entered By: [name] on 07/19/2020 14:00:57 -------------------------------------------------------------------------------- Physical Exam Details Patient Name: Date of Service: [name], Brian RLES [name]: [id number] Patient Account Mcdowell: [id number] Date of Birth/Sex: Treating RN: May 24, 1940 (80 y.o. [name] Primary Care Provider: Pricilla Mcdowell Other Clinician: Referring Provider: Treating Provider/Extender: [name] in Treatment: 5 Notes Wound exam; I unroofed the blisters which would not of survived a week under compression on the left leg. I did not touch the areas on the right were going to put him back in loose 3 3 layer wraps. We will have home health out at which time we will change the wraps to kerlix Coban. Silver alginate on the open  areas. Paradoxically the area on his left first toe does not look too bad. Mostly everything else is closed Electronic Signature(s) Signed: 07/19/2020 5:43:02 PM By: Brian Ham MD Entered By: [name] on 07/19/2020 14:01:53 -------------------------------------------------------------------------------- Physician Orders Details Patient Name: Date of Service: [name], Brian RLES [name]: [id number] Patient Account Mcdowell: [id number] Date of Birth/Sex: Treating RN: 06-Mar-1941 (80 y.o. [name], [name] Primary Care Provider: Pricilla Mcdowell Other Clinician: Referring Provider: Treating Provider/Extender: [name] in Treatment: 5 Verbal / Phone Orders: No Diagnosis Coding ICD-10 Coding Code Description [phone] Chronic venous hypertension (idiopathic) with ulcer and inflammation of bilateral lower extremity I70.203 Unspecified atherosclerosis of native arteries of extremities, bilateral legs L97.528 Non-pressure chronic ulcer of other part of left foot with other specified severity L97.228 Non-pressure chronic ulcer of left calf with other specified severity L97.128 Non-pressure chronic ulcer of left thigh with other specified severity L97.818 Non-pressure chronic ulcer of other part of right lower leg with other specified severity Follow-up Appointments ppointment in 1 week. - ***Hoyer extra time 60 minutes*** Return A Bathing/ Shower/ Hygiene May shower with protection but do not get wound dressing(s) wet. - May use cast protectors over wraps. You can find these at [facility] or [facility] Edema Control - Lymphedema / SCD / Other Elevate legs to the level of the heart or above for 30 minutes daily and/or  when sitting, a frequency of: Avoid standing for long periods of time. Compression stocking or Garment 20-30 mm/Hg pressure to: - patient and family to purchase compression stockings. Bring in weekly. do  not wear until wound center says to do so. Home Health New wound care orders this week; continue Home Health for wound care. May utilize formulary equivalent dressing for wound treatment orders unless otherwise specified. - [facility] home health weekly by home health and weekly by wound center. ***** Home Health also for physical Therapy to evaluate and treat.***** *****[place] AND [place] TO BOTH LEGS WITH DRESSING CHANGES.*** ****PLEASE APPLY CALICUM ALGINATE AND ABD PADS TO ALL OPEN AND BLISTERED AREAS.**** wound center lightly to apply 3 layer to both legs. Wound Treatment Wound #1 - Malleolus Wound Laterality: Right, Medial Cleanser: Wound Cleanser (Home Health) 2 x Per Week/15 Days Discharge Instructions: Cleanse the wound with wound cleanser prior to applying a clean dressing using gauze sponges, not tissue or cotton balls. Cleanser: Soap and Water [facility]) 2 x Per Week/15 Days Discharge Instructions: May shower and wash wound with dial antibacterial soap and water prior to dressing change. Prim Dressing: KerraCel Ag Gelling Fiber Dressing, 4x5 in (silver alginate) (Home Health) 2 x Per Week/15 Days ary Discharge Instructions: Apply silver alginate to wound bed as instructed Secondary Dressing: ABD Pad, 5x9 (Home Health) 2 x Per Week/15 Days Discharge Instructions: Apply over primary dressing as directed. Compression Wrap: ThreePress (3 layer compression wrap) 2 x Per Week/15 Days Discharge Instructions: ***In clinic only.***Apply LIGHTY three layer compression as directed. Compression Wrap: Kerlix Roll 4.5x3.1 (in/yd) (Home Health) 2 x Per Week/15 Days Discharge Instructions: ****HOME HEALTH TO APPLY AT HOME.****Apply Kerlix and Coban compression as directed. Compression Wrap: Coban Self-Adherent Wrap 4x5 (in/yd) (Home Health) 2 x Per Week/15 Days Discharge Instructions: ****HOME HEALTH TO APPLY AT HOME.****Apply over Kerlix as directed. Wound #11 - T Great oe  Wound Laterality: Left Cleanser: Wound Cleanser (Home Health) 2 x Per Week/15 Days Discharge Instructions: Cleanse the wound with wound cleanser prior to applying a clean dressing using gauze sponges, not tissue or cotton balls. Cleanser: Soap and Water [facility]) 2 x Per Week/15 Days Discharge Instructions: May shower and wash wound with dial antibacterial soap and water prior to dressing change. Prim Dressing: KerraCel Ag Gelling Fiber Dressing, 4x5 in (silver alginate) (Home Health) 2 x Per Week/15 Days ary Discharge Instructions: Apply silver alginate to wound bed as instructed Secondary Dressing: Woven Gauze Sponges 2x2 in ([place]) 2 x Per Week/15 Days Discharge Instructions: Apply over primary dressing as directed. Secured With: Child psychotherapist, Sterile 2x75 (in/in) (Home Health) 2 x Per Week/15 Days Discharge Instructions: Secure with stretch gauze as directed. Secured With: Paper Tape, 2x10 (in/yd) (Home Health) 2 x Per Week/15 Days Discharge Instructions: Secure dressing with tape as directed. Wound #12 - Lower Leg Wound Laterality: Left, Medial Cleanser: Wound Cleanser (Home Health) 2 x Per Week/15 Days Discharge Instructions: Cleanse the wound with wound cleanser prior to applying a clean dressing using gauze sponges, not tissue or cotton balls. Cleanser: Soap and Water [facility]) 2 x Per Week/15 Days Discharge Instructions: May shower and wash wound with dial antibacterial soap and water prior to dressing change. Prim Dressing: KerraCel Ag Gelling Fiber Dressing, 4x5 in (silver alginate) (Home Health) 2 x Per Week/15 Days ary Discharge Instructions: Apply silver alginate to wound bed as instructed Secondary Dressing: ABD Pad, 5x9 ([place]) 2 x Per Week/15 Days Discharge  Instructions: Apply over primary dressing as directed. Compression Wrap: ThreePress (3 layer compression wrap) 2 x Per Week/15 Days Discharge Instructions: ***In clinic  only.***Apply LIGHTY three layer compression as directed. Compression Wrap: Kerlix Roll 4.5x3.1 (in/yd) (Home Health) 2 x Per Week/15 Days Discharge Instructions: ****HOME HEALTH TO APPLY AT HOME.****Apply Kerlix and Coban compression as directed. Compression Wrap: Coban Self-Adherent Wrap 4x5 (in/yd) (Home Health) 2 x Per Week/15 Days Discharge Instructions: ****HOME HEALTH TO APPLY AT HOME.****Apply over Kerlix as directed. Wound #4 - Foot Wound Laterality: Dorsal, Left Cleanser: Wound Cleanser (Home Health) 2 x Per Week/15 Days Discharge Instructions: Cleanse the wound with wound cleanser prior to applying a clean dressing using gauze sponges, not tissue or cotton balls. Cleanser: Soap and Water [facility]) 2 x Per Week/15 Days Discharge Instructions: May shower and wash wound with dial antibacterial soap and water prior to dressing change. Prim Dressing: KerraCel Ag Gelling Fiber Dressing, 4x5 in (silver alginate) (Home Health) 2 x Per Week/15 Days ary Discharge Instructions: Apply silver alginate to wound bed as instructed Secondary Dressing: ABD Pad, 5x9 (Home Health) 2 x Per Week/15 Days Discharge Instructions: Apply over primary dressing as directed. Compression Wrap: ThreePress (3 layer compression wrap) 2 x Per Week/15 Days Discharge Instructions: ***In clinic only.***Apply LIGHTY three layer compression as directed. Compression Wrap: Kerlix Roll 4.5x3.1 (in/yd) (Home Health) 2 x Per Week/15 Days Discharge Instructions: ****HOME HEALTH TO APPLY AT HOME.****Apply Kerlix and Coban compression as directed. Compression Wrap: Coban Self-Adherent Wrap 4x5 (in/yd) (Home Health) 2 x Per Week/15 Days Discharge Instructions: ****HOME HEALTH TO APPLY AT HOME.****Apply over Kerlix as directed. Electronic Signature(s) Signed: 07/19/2020 5:43:02 PM By: Brian Ham MD Signed: 07/19/2020 5:47:39 PM By: [name] Entered By: [name] on 07/19/2020  13:58:48 -------------------------------------------------------------------------------- Problem List Details Patient Name: Date of Service: [name], Brian RLES [name]: [id number] Patient Account Mcdowell: [id number] Date of Birth/Sex: Treating RN: February 07, 1941 (80 y.o. 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, bilateral legs 06/12/2020 No Yes L97.528 Non-pressure chronic ulcer of other part of left foot with other specified 06/12/2020 No Yes severity L97.228 Non-pressure chronic ulcer of left calf with other specified severity 06/12/2020 No Yes L97.128 Non-pressure chronic ulcer of left thigh with other specified severity 06/12/2020 No Yes L97.818 Non-pressure chronic ulcer of other part of right lower leg with other specified 06/12/2020 No Yes severity Inactive Problems Resolved Problems Electronic Signature(s) Signed: 07/19/2020 5:43:02 PM By: Brian Ham MD Entered By: [name] on 07/19/2020 13:59:20 -------------------------------------------------------------------------------- Progress Note Details Patient Name: Date of Service: [name], Brian RLES [name]: [id number] Patient Account Mcdowell: [id number] Date of Birth/Sex: Treating RN: 01-31-1941 (80 y.o. [name] Primary Care Provider: Other Clinician: Pricilla Mcdowell Referring Provider: Treating Provider/Extender: [name] in Treatment: 5 Subjective History of Present Illness  (HPI) ADMISSION 06/12/2020 This is a 80 year old man who has had wounds on his bilateral lower legs and feet for several months now. Looking in epic he saw his primary physician and early November at which time he had an ulcer of the right calf. He was seen again by his primary doctor on 05/12/2020 at which time he  had bilateral lower extremity ulcers. He was reviewed by Dr. Trula Mcdowell of vein and vascular in the spring 2021. He had arterial studies done on 08/12/2019 showing an ABI on the right and 0.74 on the left and 0.69. TBI of the right of 0.29 on the left 0.35 with monophasic waveforms bilaterally. He also had venous reflux studies on 09/29/2019 that did not show evidence of DVT or SVT bilaterally. In the right he had superficial vein reflux in the SSV and mid thigh AASV greater saphenous vein had previously been harvested. On the left he did not have any superficial vein reflux but it was noted that he had interstitial fluid without throughout the lower extremity. He also has a history of CABG, congestive heart failure followed by Dr. Stanford Mcdowell and apparently liver cirrhosis. He takes Lasix 80 mg as well as spironolactone. He arrives in clinic today with 9 wounds. Most of these are superficial and look like they started with blisters. He has areas on the left dorsal foot x2, left posterior calf left anterior tibial left posterior thigh x2 the right medial lower extremity and the right medial malleolus. He has pitting edema extending well up into his thighs skin changes look like chronic stasis dermatitis. Dr. Trula Mcdowell saw him in May noted that he had tolerated Unna boots and was feeling better. Also noted the moderate arterial insufficiency and suggested if he developed nonhealing wounds then he may need attempted angiography. Past medical history extensive including multiple left hip surgeries, congestive heart failure, cirrhosis, peripheral arterial disease, chronic atrial fibrillation, recurrent  cellulitis of the legs, AAA, carotid stenosis, COPD, hypertension and hyperlipidemia We did not attempt his arterial ABIs in our clinic today because of swelling 3/1; this is a patient I admitted to the clinic last week he has multiple wounds on his bilateral lower extremities. I think he has chronic venous insufficiency and PAD. He tolerated the "light" 3 layer wrap I put him on last week and the edema control is better. 06/26/2020 upon evaluation today patient appears to be doing well with regard to his legs overall. He does have several areas of blistering but to be perfectly honest this seems to be making some progress here. Fortunately there is no signs of active infection which is great news. No fevers, chills, nausea, vomiting, or diarrhea. He does have a lot of callus over the great toe that is going require some sharp debridement. I discussed that with him today however I am that I do this extremely likely considering the fact that he does appear to potentially have some peripheral vascular disease specifically of the arterial type. Nonetheless he does have a an appointment on March 30 for formal arterial studies. 3/17; bilateral lower extremity wounds in the setting of chronic venous insufficiency and arterial insufficiency. He has a follow-up with [name] later this month and he is going to have follow-up noninvasive studies. His renal studies done in April 2021 were really very poor with ABIs of 0.74 and 0.69. TBI's less than 0.3 bilateral 3/24; patient has follow-up with [name] on March 30. In spite of the arterial studies which are really not very good he is tolerated a light 3 layer compression and we have good edema control. As a result of this he has had healing on the area on the left lateral thigh the posterior wounds on his calves bilaterally are healed. He still has an area on the left great toe right medial ankle 3/31; the patient saw vein and  vascular. Arterial studies  showed an ABI on the right noncompressible with a TBI of 0.56 on the left he was also noncompressible but a great toe pressure of 0. There was some talk about him undergoing angiography early next week but I do not think the patient was ready for that information. I have told him that this attempt is absolutely necessary. He arrives in clinic today having not had compression on his legs since yesterday. Large tight blisters on the left anterior lower leg less left dorsal foot and 2 smaller areas on the right Objective Constitutional Vitals Time Taken: 1:05 PM, Height: 71 in, Weight: 178 lbs, BMI: 24.8, Temperature: 97.7 F, Pulse: 90 bpm, Respiratory Rate: 20 breaths/min, Blood Pressure: 130/74 mmHg. Integumentary (Hair, Skin) Wound #1 status is Open. Original cause of wound was Gradually Appeared. The date acquired was: 11/20/2019. The wound has been in treatment 5 weeks. The wound is located on the Right,Medial Malleolus. The wound measures 0.5cm length x 1cm width x 0.2cm depth; 0.393cm^2 area and 0.079cm^3 volume. There is Fat Layer (Subcutaneous Tissue) exposed. There is no tunneling or undermining noted. There is a medium amount of serosanguineous drainage noted. The wound margin is well defined and not attached to the wound base. There is large (67-100%) red granulation within the wound bed. There is a small (1-33%) amount of necrotic tissue within the wound bed including Adherent Slough. Wound #11 status is Open. Original cause of wound was Gradually Appeared. The date acquired was: 06/26/2020. The wound has been in treatment 3 weeks. The wound is located on the Left T Great. The wound measures 0.4cm length x 0.5cm width x 0.2cm depth; 0.157cm^2 area and 0.031cm^3 volume. There is Fat oe Layer (Subcutaneous Tissue) exposed. There is no tunneling or undermining noted. There is a medium amount of serosanguineous drainage noted. The wound margin is distinct with the outline attached to the wound  base. There is large (67-100%) pink, pale granulation within the wound bed. There is a small (1-33%) amount of necrotic tissue within the wound bed including Adherent Slough. Wound #12 status is Open. Original cause of wound was Blister. The date acquired was: 07/18/2020. The wound is located on the Left,Medial Lower Leg. The wound measures 7cm length x 5cm width x 0.1cm depth; 27.489cm^2 area and 2.749cm^3 volume. There is Fat Layer (Subcutaneous Tissue) exposed. There is no tunneling or undermining noted. There is a medium amount of serous drainage noted. The wound margin is distinct with the outline attached to the wound base. There is large (67-100%) red granulation within the wound bed. There is no necrotic tissue within the wound bed. Wound #4 status is Open. Original cause of wound was Blister. The date acquired was: 11/20/2019. The wound has been in treatment 5 weeks. The wound is located on the Left,Dorsal Foot. The wound measures 0cm length x 0cm width x 0cm depth; 0cm^2 area and 0cm^3 volume. 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 There was a Three Layer Compression Therapy Procedure by [employer],  Vaughan Basta, Therapist, sports. Post procedure Diagnosis Wound #1: Same as Pre-Procedure Wound #12 Pre-procedure diagnosis of Wound #12 is a Lymphedema located on the Left,Medial Lower Leg . There was a Three Layer  Compression Therapy Procedure by Baruch Gouty, RN. Post procedure Diagnosis Wound #12: Same as Pre-Procedure Wound #4 Pre-procedure diagnosis of Wound #4 is a Lymphedema located on the Left,Dorsal Foot . There was a Three Layer Compression Therapy Procedure by Baruch Gouty, RN. Post procedure Diagnosis Wound #4: Same as Pre-Procedure Plan Follow-up Appointments: Return Appointment in 1 week. - ***Hoyer extra time 60 minutes*** Bathing/ Shower/ Hygiene: May shower with protection but do not get wound dressing(s) wet. - May use cast protectors over wraps. You can find these at [facility] or [facility] Edema Control - Lymphedema / SCD / Other: Elevate legs to the level of the heart or above for 30 minutes daily and/or when sitting, a frequency of: Avoid standing for long periods of time. Compression stocking or Garment 20-30 mm/Hg pressure to: - patient and family to purchase compression stockings. Bring in weekly. do not wear until wound center says to do so. Home Health: New wound care orders this week; continue Home Health for wound care. May utilize formulary equivalent dressing for wound treatment orders unless otherwise specified. - [facility] home health weekly by home health and weekly by wound center. ***** Home Health also for physical Therapy to evaluate and treat.***** *****[place] AND [place] TO BOTH LEGS WITH DRESSING CHANGES.*** ****PLEASE APPLY CALICUM ALGINATE AND ABD PADS TO ALL OPEN AND BLISTERED AREAS.**** wound center lightly to apply 3 layer to both legs. WOUND #1: - Malleolus Wound Laterality: Right, Medial Cleanser: Wound Cleanser (Home Health) 2 x Per Week/15 Days Discharge Instructions: Cleanse the wound with wound cleanser prior to applying a clean dressing using gauze sponges, not tissue or cotton balls. Cleanser: Soap and Water [facility]) 2 x Per Week/15 Days Discharge Instructions: May shower and wash wound with dial antibacterial soap and water  prior to dressing change. Prim Dressing: KerraCel Ag Gelling Fiber Dressing, 4x5 in (silver alginate) (Home Health) 2 x Per Week/15 Days ary Discharge Instructions: Apply silver alginate to wound bed as instructed Secondary Dressing: ABD Pad, 5x9 (Home Health) 2 x Per Week/15 Days Discharge Instructions: Apply over primary dressing as directed. Com pression Wrap: ThreePress (3 layer compression wrap) 2 x Per Week/15 Days Discharge Instructions: ***In clinic only.***Apply LIGHTY three layer compression as directed. Com pression Wrap: Kerlix Roll 4.5x3.1 (in/yd) (Home Health) 2 x Per Week/15 Days Discharge Instructions: ****HOME HEALTH TO APPLY AT HOME.****Apply Kerlix and Coban compression as directed. Com pression Wrap: Coban Self-Adherent Wrap 4x5 (in/yd) (Home Health) 2 x Per Week/15 Days Discharge Instructions: ****HOME HEALTH TO APPLY AT HOME.****Apply over Kerlix as directed. WOUND #11: - T Great Wound Laterality: Left oe Cleanser: Wound Cleanser (Home Health) 2 x Per Week/15 Days Discharge Instructions: Cleanse the wound with wound cleanser prior to applying a clean dressing using gauze sponges, not tissue or cotton balls. Cleanser: Soap and Water [facility]) 2 x Per Week/15 Days Discharge Instructions: May shower and wash wound with dial antibacterial soap and water prior to dressing change. Prim Dressing: KerraCel Ag Gelling Fiber Dressing, 4x5 in (silver alginate) (Home Health) 2 x Per Week/15 Days ary Discharge Instructions: Apply silver alginate to wound bed as instructed Secondary Dressing: Woven Gauze Sponges 2x2 in ([place]) 2 x Per Week/15 Days Discharge Instructions: Apply over primary dressing as directed. Secured With: Child psychotherapist,  Sterile 2x75 (in/in) (Home Health) 2 x Per Week/15 Days Discharge Instructions: Secure with stretch gauze as directed. Secured With: Paper T ape, 2x10 (in/yd) (Home Health) 2 x Per Week/15 Days Discharge Instructions:  Secure dressing with tape as directed. WOUND #12: - Lower Leg Wound Laterality: Left, Medial Cleanser: Wound Cleanser (Home Health) 2 x Per Week/15 Days Discharge Instructions: Cleanse the wound with wound cleanser prior to applying a clean dressing using gauze sponges, not tissue or cotton balls. Cleanser: Soap and Water [facility]) 2 x Per Week/15 Days Discharge Instructions: May shower and wash wound with dial antibacterial soap and water prior to dressing change. Prim Dressing: KerraCel Ag Gelling Fiber Dressing, 4x5 in (silver alginate) (Home Health) 2 x Per Week/15 Days ary Discharge Instructions: Apply silver alginate to wound bed as instructed Secondary Dressing: ABD Pad, 5x9 (Home Health) 2 x Per Week/15 Days Discharge Instructions: Apply over primary dressing as directed. Com pression Wrap: ThreePress (3 layer compression wrap) 2 x Per Week/15 Days Discharge Instructions: ***In clinic only.***Apply LIGHTY three layer compression as directed. Com pression Wrap: Kerlix Roll 4.5x3.1 (in/yd) (Home Health) 2 x Per Week/15 Days Discharge Instructions: ****HOME HEALTH TO APPLY AT HOME.****Apply Kerlix and Coban compression as directed. Com pression Wrap: Coban Self-Adherent Wrap 4x5 (in/yd) (Home Health) 2 x Per Week/15 Days Discharge Instructions: ****HOME HEALTH TO APPLY AT HOME.****Apply over Kerlix as directed. WOUND #4: - Foot Wound Laterality: Dorsal, Left Cleanser: Wound Cleanser (Home Health) 2 x Per Week/15 Days Discharge Instructions: Cleanse the wound with wound cleanser prior to applying a clean dressing using gauze sponges, not tissue or cotton balls. Cleanser: Soap and Water [facility]) 2 x Per Week/15 Days Discharge Instructions: May shower and wash wound with dial antibacterial soap and water prior to dressing change. Prim Dressing: KerraCel Ag Gelling Fiber Dressing, 4x5 in (silver alginate) (Home Health) 2 x Per Week/15 Days ary Discharge Instructions: Apply silver  alginate to wound bed as instructed Secondary Dressing: ABD Pad, 5x9 (Home Health) 2 x Per Week/15 Days Discharge Instructions: Apply over primary dressing as directed. Com pression Wrap: ThreePress (3 layer compression wrap) 2 x Per Week/15 Days Discharge Instructions: ***In clinic only.***Apply LIGHTY three layer compression as directed. Com pression Wrap: Kerlix Roll 4.5x3.1 (in/yd) (Home Health) 2 x Per Week/15 Days Discharge Instructions: ****HOME HEALTH TO APPLY AT HOME.****Apply Kerlix and Coban compression as directed. Com pression Wrap: Coban Self-Adherent Wrap 4x5 (in/yd) (Home Health) 2 x Per Week/15 Days Discharge Instructions: ****HOME HEALTH TO APPLY AT HOME.****Apply over Kerlix as directed. 1. Continue with silver alginate to any open areas including the blistered areas 2. Light 3 layer compression which seems to have helped with his edema. This is closed most of the wounds he had 3. I encouraged angiography if it is recommended by vein and vascular 4. Back next week. We will have orders for home health Electronic Signature(s) Signed: 07/19/2020 5:43:02 PM By: Brian Ham MD Entered By: [name] on 07/19/2020 14:03:17 -------------------------------------------------------------------------------- SuperBill Details Patient Name: Date of Service: [name], Brian RLES E. 07/19/2020 Medical Record Mcdowell: [id number] Patient Account Mcdowell: [id number] Date of Birth/Sex: Treating RN: Jul 15, 1940 (80 y.o. 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

## 2020-07-19 NOTE — Progress Notes (Signed)
Mcdowell, Brian JUN ([id number]) Visit Report for 07/19/2020 Arrival Information Details Patient Name: Date of Service: [name] 07/19/2020 1:15 PM Medical Record Number: [id number] Patient Account Number: [id number] Date of Birth/Sex: Treating RN: February 06, 1941 (80 y.o. [name] Primary Care Gurjot Brisco: [name] Other Clinician: Referring Laquita Harlan: Treating Nya Monds/Extender: [name] in Treatment: 5 Visit Information History Since Last Visit All ordered tests and consults were completed: Yes Patient Arrived: Wheel Chair Added or deleted any medications: No Arrival Time: 13:05 Any new allergies or adverse reactions: No Accompanied By: self Had a fall or experienced change in No Transfer Assistance: [employer] activities of daily living that may affect Patient Identification Verified: Yes risk of falls: Secondary Verification Process Completed: Yes Signs or symptoms of abuse/neglect since last visito No Patient Requires Transmission-Based Precautions: No Hospitalized since last visit: No Patient Has Alerts: No Has Dressing in Place as Prescribed: No Pain Present Now: Yes Notes removed compression wraps for tests yesterday at [facility]. Blisters noted to BLE. Electronic Signature(s) Signed: 07/19/2020 5:47:39 PM By: [name] Entered By: [name] on 07/19/2020 13:06:02 -------------------------------------------------------------------------------- Compression Therapy Details Patient Name: Date of Service: [name], Brian RLES E. 07/19/2020 1:15 PM Medical Record Number: [id number] Patient Account Number: [id number] Date of Birth/Sex: Treating RN: 02-04-1941 (80 y.o. [name] Primary Care [name]: [name] Other Clinician: Referring Ashlye Oviedo: Treating [name]/Extender: [name] in Treatment: 5 Compression Therapy Performed for Wound Assessment: Wound #1 Right,Medial  Malleolus Performed By: Clinician Baruch Gouty, RN Compression Type: Three Layer Post Procedure Diagnosis Same as Pre-procedure Electronic Signature(s) Signed: 07/19/2020 5:47:39 PM By: [name] Entered By: [name] on 07/19/2020 13:57:05 -------------------------------------------------------------------------------- Compression Therapy Details Patient Name: Date of Service: [name], Brian RLES E. 07/19/2020 1:15 PM Medical Record Number: [id number] Patient Account Number: [id number] Date of Birth/Sex: Treating RN: Sep 26, 1940 (80 y.o. [name] Primary Care [name]: [name] Other Clinician: Referring [name]: Treating [name]/Extender: [name] in Treatment: 5 Compression Therapy Performed for Wound Assessment: Wound #12 Left,Medial Lower Leg Performed By: Clinician Baruch Gouty, RN Compression Type: Three Layer Post Procedure Diagnosis Same as Pre-procedure Electronic Signature(s) Signed: 07/19/2020 5:47:39 PM By: [name] Entered By: [name] on 07/19/2020 13:57:06 -------------------------------------------------------------------------------- Compression Therapy Details Patient Name: Date of Service: [name], Brian RLES E. 07/19/2020 1:15 PM Medical Record Number: [id number] Patient Account Number: [id number] Date of Birth/Sex: Treating RN: November 01, 1940 (80 y.o. [name] Primary Care [name]: [name] Other Clinician: Referring [name]: Treating [name]/Extender: [name] in Treatment: 5 Compression Therapy Performed for Wound Assessment: Wound #4 Left,Dorsal Foot Performed By: Clinician Baruch Gouty, RN Compression Type: Three Layer Post Procedure Diagnosis Same as Pre-procedure Electronic Signature(s) Signed: 07/19/2020 5:47:39 PM By: [name] Entered By: [name] on 07/19/2020  13:57:06 -------------------------------------------------------------------------------- Encounter Discharge Information Details Patient Name: Date of Service: [name], Brian RLES E. 07/19/2020 1:15 PM Medical Record Number: [id number] Patient Account Number: [id number] Date of Birth/Sex: Treating RN: 03/30/1941 (80 y.o. [name] Primary Care [name]: [name] Other Clinician: Referring [name]: Treating [name]/Extender: [name] in Treatment: 5 Encounter Discharge Information Items Discharge Condition: Stable Ambulatory Status: Wheelchair Discharge Destination: Home Transportation: Other Accompanied By: self Schedule Follow-up Appointment: Yes Clinical Summary of Care: Patient Declined Notes transportation service Electronic Signature(s) Signed: 07/19/2020 5:43:47 PM By: Baruch Gouty RN, BSN Entered By: Baruch Gouty on 07/19/2020 15:53:28 -------------------------------------------------------------------------------- Lower Extremity Assessment Details Patient Name: Date  of Service: [name], Brian RLES E. 07/19/2020 1:15 PM Medical Record Number: [id number] Patient Account Number: [id number] Date of Birth/Sex: Treating RN: 1940/10/27 (80 y.o. [name] Primary Care [name]: [name] Other Clinician: Referring [name]: Treating [name]/Extender: [name] in Treatment: 5 Edema Assessment Assessed: [name]: Yes] [name]: Yes] Edema: [Left: Yes] [Right: Yes] Calf Left: Right: Point of Measurement: 29 cm From Medial Instep 35 cm 34 cm Ankle Left: Right: Point of Measurement: 10 cm From Medial Instep 25 cm 23 cm Vascular Assessment Pulses: Dorsalis Pedis Palpable: [Left:No] [Right:No] Electronic Signature(s) Signed: 07/19/2020 5:47:39 PM By: [name] Entered By: [name] on 07/19/2020  13:23:51 -------------------------------------------------------------------------------- Multi Wound Chart Details Patient Name: Date of Service: [name], Brian RLES E. 07/19/2020 1:15 PM Medical Record Number: [id number] Patient Account Number: [id number] Date of Birth/Sex: Treating RN: 22-Dec-1940 (80 y.o. [name], [name] Primary Care [name]: [name] Other Clinician: Referring Marin Wisner: Treating Sarahi Borland/Extender: [name] in Treatment: 5 Vital Signs Height(in): 36 Pulse(bpm): 33 Weight(lbs): 11 Blood Pressure(mmHg): 130/74 Body Mass Index(BMI): 25 Temperature(F): 97.7 Respiratory Rate(breaths/min): 20 Photos: [1:No Photos Right, Medial Malleolus] [11:No Photos Left T Great oe] [12:No Photos Left, Medial Lower Leg] Wound Location: [1:Gradually Appeared] [11:Gradually Appeared] [12:Blister] Wounding Event: [1:Venous Leg Ulcer] [11:Lymphedema] [12:Lymphedema] Primary Etiology: [1:Lymphedema] [11:N/A] [12:N/A] 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, Chronic Obstructive] [11:Anemia, Chronic Obstructive] [12:Anemia, Chronic Obstructive] Comorbid History: [1:Pulmonary Disease (COPD), Arrhythmia, Congestive Heart Failure, Arrhythmia, Congestive Heart Failure, Arrhythmia, Congestive Heart Failure, Coronary Artery Disease, Hypertension, Cirrhosis , Osteoarthritis Hypertension, Cirrhosis ,  Osteoarthritis Hypertension, Cirrhosis , Osteoarthritis 11/20/2019] [11:Pulmonary Disease (COPD), Coronary Artery Disease, 06/26/2020] [12:Pulmonary Disease (COPD), Coronary Artery Disease, 07/18/2020] Date Acquired: [1:5] [11:3] [12:0] Weeks of Treatment: [1:Open] [11:Open] [12:Open] Wound Status: [1:0.5x1x0.2] [11:0.4x0.5x0.2] [12:7x5x0.1] Measurements L x W x D (cm) [1:0.393] [11:0.157] [12:27.489] A (cm) : rea [1:0.079] [11:0.031] [12:2.749] Volume (cm) : [1:96.70%] [11:-98.70%] [12:N/A] % Reduction in Area: [1:93.40%] [11:-287.50%] [12:N/A] % Reduction in  Volume: [1:Full Thickness Without Exposed] [11:Full Thickness Without Exposed] [12:Full Thickness Without Exposed] Classification: [1:Support Structures Medium] [11:Support Structures Medium] [12:Support Structures Medium] Exudate Amount: [1:Serosanguineous] [11:Serosanguineous] [12:Serous] Exudate Type: [1:red, brown] [11:red, brown] [12:amber] Exudate Color: [1:Well defined, not attached] [11:Distinct, outline attached] [12:Distinct, outline attached] Wound Margin: [1:Large (67-100%)] [11:Large (67-100%)] [12:Large (67-100%)] Granulation Amount: [1:Red] [11:Pink, Pale] [12:Red] Granulation Quality: [1:Small (1-33%)] [11:Small (1-33%)] [12:None Present (0%)] Necrotic Amount: [1:Fat Layer (Subcutaneous Tissue): Yes Fat Layer (Subcutaneous Tissue): Yes Fat Layer (Subcutaneous Tissue): Yes] Exposed Structures: [1:Fascia: No Tendon: No Muscle: No Joint: No Bone: No Medium (34-66%)] [11:Fascia: No Tendon: No Muscle: No Joint: No Bone: No None] [12:Fascia: No Tendon: No Muscle: No Joint: No Bone: No None] Epithelialization: [1:Compression Therapy] [11:N/A] [12:Compression Spencer Wound Number: 4 N/A N/A Photos: No Photos N/A N/A Left, Dorsal Foot N/A N/A Wound Location: Blister N/A N/A Wounding Event: Lymphedema N/A N/A Primary Etiology: N/A N/A N/A Secondary Etiology: N/A N/A N/A Comorbid History: 11/20/2019 N/A N/A Date Acquired: 5 N/A N/A Weeks of Treatment: Open N/A N/A Wound Status: 0x0x0 N/A N/A Measurements L x W x D (cm) 0 N/A N/A A (cm) : rea 0 N/A N/A Volume (cm) : 100.00% N/A N/A % Reduction in A rea: 100.00% N/A N/A % Reduction in Volume: Full Thickness Without Exposed N/A N/A Classification: Support Structures N/A N/A N/A Exudate A mount: N/A N/A N/A Exudate Type: N/A N/A N/A Exudate Color: N/A N/A N/A Wound Margin: N/A N/A N/A Granulation A mount: N/A N/A N/A Granulation Quality: N/A N/A  N/A Necrotic A mount: N/A N/A N/A Exposed Structures: N/A  N/A N/A Epithelialization: Compression Therapy N/A N/A Procedures Performed: Treatment Notes Electronic Signature(s) Signed: 07/19/2020 5:43:02 PM By: Linton Ham MD Signed: 07/19/2020 5:47:39 PM By: [name] Entered By: Linton Ham on 07/19/2020 13:59:28 -------------------------------------------------------------------------------- Multi-Disciplinary Care Plan Details Patient Name: Date of Service: [name], Brian RLES E. 07/19/2020 1:15 PM Medical Record Number: [id number] Patient Account Number: [id number] Date of Birth/Sex: Treating RN: 1940-07-10 (80 y.o. [name] Primary Care Kaevon Cotta: [name] Other Clinician: Referring Dejanae Helser: Treating Loralyn Rachel/Extender: [name] in Treatment: 5 Active Inactive Wound/Skin Impairment Nursing Diagnoses: Impaired tissue integrity Knowledge deficit related to ulceration/compromised skin integrity Goals: Patient will have a decrease in wound volume by X% from date: (specify in notes) Date Initiated: 06/12/2020 Target Resolution Date: 08/17/2020 Goal Status: Active Patient/caregiver will verbalize understanding of skin care regimen Date Initiated: 06/12/2020 Target Resolution Date: 08/17/2020 Goal Status: Active Ulcer/skin breakdown will have a volume reduction of 30% by week 4 Date Initiated: 06/12/2020 Date Inactivated: 07/05/2020 Target Resolution Date: 06/29/2020 Unmet Reason: see wound Goal Status: Unmet measurements. Interventions: Assess patient/caregiver ability to obtain necessary supplies Assess patient/caregiver ability to perform ulcer/skin care regimen upon admission and as needed Assess ulceration(s) every visit Notes: Electronic Signature(s) Signed: 07/19/2020 5:47:39 PM By: [name] Entered By: [name] on 07/19/2020 13:52:22 -------------------------------------------------------------------------------- Pain Assessment Details Patient Name: Date  of Service: [name], Brian RLES E. 07/19/2020 1:15 PM Medical Record Number: [id number] Patient Account Number: [id number] Date of Birth/Sex: Treating RN: [birth year] (80 y.o. [name] Primary Care Hester Forget: [name] Other Clinician: Referring Jaslene Marsteller: Treating Aleen Marston/Extender: [name] in Treatment: 5 Active Problems Location of Pain Severity and Description of Pain Patient Has Paino Yes Site Locations Pain Location: Pain Location: Generalized Pain, Pain in Ulcers Rate the pain. Current Pain Level: 9 Worst Pain Level: 9 Least Pain Level: 0 Tolerable Pain Level: 8 Pain Management and Medication Current Pain Management: Medication: No Cold Application: No Rest: No Massage: No Activity: No T.McdowellN.S.: No Heat Application: No Leg drop or elevation: No Is the Current Pain Management Adequate: Adequate How does your wound impact your activities of daily livingo Sleep: No Bathing: No Appetite: No Relationship With Others: No Bladder Continence: No Emotions: No Bowel Continence: No Work: No Toileting: No Drive: No Dressing: No Hobbies: No Electronic Signature(s) Signed: 07/19/2020 5:47:39 PM By: [name] Entered By: [name] on 07/19/2020 13:06:41 -------------------------------------------------------------------------------- Patient/Caregiver Education Details Patient Name: Date of Service: [name], Fletcher. 3/31/2022andnbsp1:15 PM Medical Record Number: [id number] Patient Account Number: [id number] Date of Birth/Gender: Treating RN: 1940-09-25 (80 y.o. [name] Primary Care Physician: [name] Other Clinician: Referring Physician: Treating Physician/Extender: [name] in Treatment: 5 Education Assessment Education Provided To: Patient Education Topics Provided Wound/Skin Impairment: Handouts: Skin Care Do's and Dont's Methods:  Explain/Verbal Responses: Reinforcements needed Electronic Signature(s) Signed: 07/19/2020 5:47:39 PM By: [name] Entered By: [name] on 07/19/2020 13:52:34 -------------------------------------------------------------------------------- Wound Assessment Details Patient Name: Date of Service: [name], Brian RLES E. 07/19/2020 1:15 PM Medical Record Number: [id number] Patient Account Number: [id number] Date of Birth/Sex: Treating RN: March 11, 1941 (80 y.o. [name] Primary Care Kaylub Detienne: [name] Other Clinician: Referring Gearld Kerstein: Treating Limuel Nieblas/Extender: [name] in Treatment: 5 Wound Status Wound Number: 1 Primary Venous Leg Ulcer Etiology: Wound Location: Right, Medial Malleolus Secondary Lymphedema Wounding Event: Gradually Appeared Etiology: Date Acquired: 11/20/2019 Wound Open Weeks Of  Treatment: 5 Status: Clustered Wound: No Comorbid Anemia, Chronic Obstructive Pulmonary Disease (COPD), History: Arrhythmia, Congestive Heart Failure, Coronary Artery Disease, Hypertension, Cirrhosis , Osteoarthritis Photos Wound Measurements Length: (cm) 0.5 Width: (cm) 1 Depth: (cm) 0.2 Area: (cm) 0.393 Volume: (cm) 0.079 % Reduction in Area: 96.7% % Reduction in Volume: 93.4% Epithelialization: Medium (34-66%) Tunneling: No Undermining: No Wound Description Classification: Full Thickness Without Exposed Support Structures Wound Margin: Well defined, not attached Exudate Amount: Medium Exudate Type: Serosanguineous Exudate Color: red, brown Foul Odor After Cleansing: No Slough/Fibrino Yes Wound Bed Granulation Amount: Large (67-100%) Exposed Structure Granulation Quality: Red Fascia Exposed: No Necrotic Amount: Small (1-33%) Fat Layer (Subcutaneous Tissue) Exposed: Yes Necrotic Quality: Adherent Slough Tendon Exposed: No Muscle Exposed: No Joint Exposed: No Bone Exposed: No Treatment Notes Wound #1  (Malleolus) Wound Laterality: Right, Medial Cleanser Wound Cleanser Discharge Instruction: Cleanse the wound with wound cleanser prior to applying a clean dressing using gauze sponges, not tissue or cotton balls. Soap and Water Discharge Instruction: May shower and wash wound with dial antibacterial soap and water prior to dressing change. Peri-Wound Care Topical Primary Dressing KerraCel Ag Gelling Fiber Dressing, 4x5 in (silver alginate) Discharge Instruction: Apply silver alginate to wound bed as instructed Secondary Dressing ABD Pad, 5x9 Discharge Instruction: Apply over primary dressing as directed. Secured With Compression Wrap ThreePress (3 layer compression wrap) Discharge Instruction: ***In clinic only.***Apply LIGHTY three layer compression as directed. Kerlix Roll 4.5x3.1 (in/yd) Discharge Instruction: ****HOME HEALTH TO APPLY AT HOME.****Apply Kerlix and Coban compression as directed. Coban Self-Adherent Wrap 4x5 (in/yd) Discharge Instruction: ****[place] AT HOME.****Apply over Kerlix as directed. Compression Stockings Add-Ons Electronic Signature(s) Signed: 07/19/2020 5:28:26 PM By: Sandre Kitty Signed: 07/19/2020 5:47:39 PM By: [name] Entered By: Sandre Kitty on 07/19/2020 17:13:53 -------------------------------------------------------------------------------- Wound Assessment Details Patient Name: Date of Service: [name], Brian RLES E. 07/19/2020 1:15 PM Medical Record Number: [id number] Patient Account Number: [id number] Date of Birth/Sex: Treating RN: 09/12/1940 (80 y.o. [name], [name] Primary Care Tori Dattilio: [name] Other Clinician: Referring Chaitra Mast: Treating Elizabella Nolet/Extender: [name] in Treatment: 5 Wound Status Wound Number: 11 Primary Lymphedema Etiology: Wound Location: Left T Great oe Wound Open Wounding Event: Gradually Appeared Status: Date Acquired: 06/26/2020 Comorbid  Anemia, Chronic Obstructive Pulmonary Disease (COPD), Weeks Of Treatment: 3 History: Arrhythmia, Congestive Heart Failure, Coronary Artery Disease, Clustered Wound: No Hypertension, Cirrhosis , Osteoarthritis Photos Wound Measurements Length: (cm) 0.4 Width: (cm) 0.5 Depth: (cm) 0.2 Area: (cm) 0.157 Volume: (cm) 0.031 % Reduction in Area: -98.7% % Reduction in Volume: -287.5% Epithelialization: None Tunneling: No Undermining: No Wound Description Classification: Full Thickness Without Exposed Support Structures Wound Margin: Distinct, outline attached Exudate Amount: Medium Exudate Type: Serosanguineous Exudate Color: red, brown Foul Odor After Cleansing: No Slough/Fibrino Yes Wound Bed Granulation Amount: Large (67-100%) Exposed Structure Granulation Quality: Pink, Pale Fascia Exposed: No Necrotic Amount: Small (1-33%) Fat Layer (Subcutaneous Tissue) Exposed: Yes Necrotic Quality: Adherent Slough Tendon Exposed: No Muscle Exposed: No Joint Exposed: No Bone Exposed: No Treatment Notes Wound #11 (Toe Great) Wound Laterality: Left Cleanser Wound Cleanser Discharge Instruction: Cleanse the wound with wound cleanser prior to applying a clean dressing using gauze sponges, not tissue or cotton balls. Soap and Water Discharge Instruction: May shower and wash wound with dial antibacterial soap and water prior to dressing change. Peri-Wound Care Topical Primary Dressing KerraCel Ag Gelling Fiber Dressing, 4x5 in (silver alginate) Discharge Instruction: Apply silver alginate to wound bed as instructed Secondary Dressing Woven Gauze Sponges  2x2 in Discharge Instruction: Apply over primary dressing as directed. Secured With Conforming Stretch Gauze Bandage, Sterile 2x75 (in/in) Discharge Instruction: Secure with stretch gauze as directed. Paper Tape, 2x10 (in/yd) Discharge Instruction: Secure dressing with tape as directed. Compression Wrap Compression  Stockings Add-Ons Electronic Signature(s) Signed: 07/19/2020 5:28:26 PM By: Sandre Kitty Signed: 07/19/2020 5:47:39 PM By: [name] Entered By: Sandre Kitty on 07/19/2020 17:14:13 -------------------------------------------------------------------------------- Wound Assessment Details Patient Name: Date of Service: [name], Brian RLES E. 07/19/2020 1:15 PM Medical Record Number: [id number] Patient Account Number: [id number] Date of Birth/Sex: Treating RN: Jul 26, 1940 (80 y.o. [name], [name] Primary Care Pearson Reasons: [name] Other Clinician: Referring Shyan Scalisi: Treating Michaiah Maiden/Extender: [name] in Treatment: 5 Wound Status Wound Number: 12 Primary Lymphedema Etiology: Wound Location: Left, Medial Lower Leg Wound Open Wounding Event: Blister Status: Date Acquired: 07/18/2020 Comorbid Anemia, Chronic Obstructive Pulmonary Disease (COPD), Weeks Of Treatment: 0 History: Arrhythmia, Congestive Heart Failure, Coronary Artery Disease, Clustered Wound: No Hypertension, Cirrhosis , Osteoarthritis Photos Wound Measurements Length: (cm) 7 Width: (cm) 5 Depth: (cm) 0.1 Area: (cm) 27.489 Volume: (cm) 2.749 % Reduction in Area: 0% % Reduction in Volume: 0% Epithelialization: None Tunneling: No Undermining: No Wound Description Classification: Full Thickness Without Exposed Support Structures Wound Margin: Distinct, outline attached Exudate Amount: Medium Exudate Type: Serous Exudate Color: amber Foul Odor After Cleansing: No Slough/Fibrino No Wound Bed Granulation Amount: Large (67-100%) Exposed Structure Granulation Quality: Red Fascia Exposed: No Necrotic Amount: None Present (0%) Fat Layer (Subcutaneous Tissue) Exposed: Yes Tendon Exposed: No Muscle Exposed: No Joint Exposed: No Bone Exposed: No Treatment Notes Wound #12 (Lower Leg) Wound Laterality: Left, Medial Cleanser Wound Cleanser Discharge Instruction:  Cleanse the wound with wound cleanser prior to applying a clean dressing using gauze sponges, not tissue or cotton balls. Soap and Water Discharge Instruction: May shower and wash wound with dial antibacterial soap and water prior to dressing change. Peri-Wound Care Topical Primary Dressing KerraCel Ag Gelling Fiber Dressing, 4x5 in (silver alginate) Discharge Instruction: Apply silver alginate to wound bed as instructed Secondary Dressing ABD Pad, 5x9 Discharge Instruction: Apply over primary dressing as directed. Secured With Compression Wrap ThreePress (3 layer compression wrap) Discharge Instruction: ***In clinic only.***Apply LIGHTY three layer compression as directed. Kerlix Roll 4.5x3.1 (in/yd) Discharge Instruction: ****HOME HEALTH TO APPLY AT HOME.****Apply Kerlix and Coban compression as directed. Coban Self-Adherent Wrap 4x5 (in/yd) Discharge Instruction: ****[place] AT HOME.****Apply over Kerlix as directed. Compression Stockings Add-Ons Electronic Signature(s) Signed: 07/19/2020 5:28:26 PM By: Sandre Kitty Signed: 07/19/2020 5:47:39 PM By: [name] Entered By: Sandre Kitty on 07/19/2020 17:14:55 -------------------------------------------------------------------------------- Wound Assessment Details Patient Name: Date of Service: [name], Brian RLES E. 07/19/2020 1:15 PM Medical Record Number: [id number] Patient Account Number: [id number] Date of Birth/Sex: Treating RN: 09/12/1940 (80 y.o. [name], [name] Primary Care Phylicia Mcgaugh: [name] Other Clinician: Referring Connie Hilgert: Treating Veria Stradley/Extender: [name] in Treatment: 5 Wound Status Wound Number: 4 Primary Lymphedema Etiology: Wound Location: Left, Dorsal Foot Wound Open Wounding Event: Blister Status: Date Acquired: 11/20/2019 Comorbid Anemia, Chronic Obstructive Pulmonary Disease (COPD), Weeks Of Treatment: 5 History: Arrhythmia,  Congestive Heart Failure, Coronary Artery Disease, Clustered Wound: No Hypertension, Cirrhosis , Osteoarthritis Photos Wound Measurements Length: (cm) Width: (cm) Depth: (cm) Area: (cm) Volume: (cm) 0 % Reduction in Area: 100% 0 % Reduction in Volume: 100% 0 Epithelialization: Large (67-100%) 0 0 Wound Description Classification: Full Thickness Without Exposed Support Structures Wound Margin: Distinct, outline attached Exudate Amount: Medium Exudate  Type: Serosanguineous Exudate Color: red, brown Foul Odor After Cleansing: No Slough/Fibrino Yes Wound Bed Granulation Amount: Large (67-100%) Exposed Structure Granulation Quality: Pink Fascia Exposed: No Necrotic Amount: Small (1-33%) Fat Layer (Subcutaneous Tissue) Exposed: Yes Necrotic Quality: Adherent Slough Tendon Exposed: No Muscle Exposed: No Joint Exposed: No Bone Exposed: No Treatment Notes Wound #4 (Foot) Wound Laterality: Dorsal, Left Cleanser Wound Cleanser Discharge Instruction: Cleanse the wound with wound cleanser prior to applying a clean dressing using gauze sponges, not tissue or cotton balls. Soap and Water Discharge Instruction: May shower and wash wound with dial antibacterial soap and water prior to dressing change. Peri-Wound Care Topical Primary Dressing KerraCel Ag Gelling Fiber Dressing, 4x5 in (silver alginate) Discharge Instruction: Apply silver alginate to wound bed as instructed Secondary Dressing ABD Pad, 5x9 Discharge Instruction: Apply over primary dressing as directed. Secured With Compression Wrap ThreePress (3 layer compression wrap) Discharge Instruction: ***In clinic only.***Apply LIGHTY three layer compression as directed. Kerlix Roll 4.5x3.1 (in/yd) Discharge Instruction: ****HOME HEALTH TO APPLY AT HOME.****Apply Kerlix and Coban compression as directed. Coban Self-Adherent Wrap 4x5 (in/yd) Discharge Instruction: ****[place] AT HOME.****Apply over Kerlix as  directed. Compression Stockings Add-Ons Electronic Signature(s) Signed: 07/19/2020 5:28:26 PM By: Sandre Kitty Signed: 07/19/2020 5:47:39 PM By: [name] Entered By: Sandre Kitty on 07/19/2020 17:14:32 -------------------------------------------------------------------------------- Vitals Details Patient Name: Date of Service: [name], Brian RLES E. 07/19/2020 1:15 PM Medical Record Number: [id number] Patient Account Number: [id number] Date of Birth/Sex: Treating RN: 07-Jul-1940 (80 y.o. [name] Primary Care Lyan Moyano: [name] Other Clinician: Referring Tamikia Chowning: Treating Emina Ribaudo/Extender: [name] in Treatment: 5 Vital Signs Time Taken: 13:05 Temperature (F): 97.7 Height (in): 71 Pulse (bpm): 90 Weight (lbs): 178 Respiratory Rate (breaths/min): 20 Body Mass Index (BMI): 24.8 Blood Pressure (mmHg): 130/74 Reference Range: 80 - 120 mg / dl Electronic Signature(s) Signed: 07/19/2020 5:47:39 PM By: [name] Entered By: [name] on 07/19/2020 13:06:30

## 2020-07-23 ENCOUNTER — Ambulatory Visit: Payer: Medicare Other | Admitting: Surgery

## 2020-07-23 ENCOUNTER — Telehealth: Payer: Self-pay | Admitting: *Deleted

## 2020-07-23 ENCOUNTER — Telehealth: Payer: Self-pay | Admitting: Physician Assistant

## 2020-07-23 DIAGNOSIS — I110 Hypertensive heart disease with heart failure: Secondary | ICD-10-CM | POA: Diagnosis not present

## 2020-07-23 DIAGNOSIS — L97222 Non-pressure chronic ulcer of left calf with fat layer exposed: Secondary | ICD-10-CM | POA: Diagnosis not present

## 2020-07-23 DIAGNOSIS — I87333 Chronic venous hypertension (idiopathic) with ulcer and inflammation of bilateral lower extremity: Secondary | ICD-10-CM | POA: Diagnosis not present

## 2020-07-23 DIAGNOSIS — I509 Heart failure, unspecified: Secondary | ICD-10-CM | POA: Diagnosis not present

## 2020-07-23 DIAGNOSIS — K7460 Unspecified cirrhosis of liver: Secondary | ICD-10-CM | POA: Diagnosis not present

## 2020-07-23 DIAGNOSIS — I714 Abdominal aortic aneurysm, without rupture: Secondary | ICD-10-CM | POA: Diagnosis not present

## 2020-07-23 DIAGNOSIS — I890 Lymphedema, not elsewhere classified: Secondary | ICD-10-CM | POA: Diagnosis not present

## 2020-07-23 DIAGNOSIS — L97522 Non-pressure chronic ulcer of other part of left foot with fat layer exposed: Secondary | ICD-10-CM | POA: Diagnosis not present

## 2020-07-23 DIAGNOSIS — E785 Hyperlipidemia, unspecified: Secondary | ICD-10-CM | POA: Diagnosis not present

## 2020-07-23 DIAGNOSIS — L97312 Non-pressure chronic ulcer of right ankle with fat layer exposed: Secondary | ICD-10-CM | POA: Diagnosis not present

## 2020-07-23 DIAGNOSIS — J449 Chronic obstructive pulmonary disease, unspecified: Secondary | ICD-10-CM | POA: Diagnosis not present

## 2020-07-23 DIAGNOSIS — I6529 Occlusion and stenosis of unspecified carotid artery: Secondary | ICD-10-CM | POA: Diagnosis not present

## 2020-07-23 DIAGNOSIS — I739 Peripheral vascular disease, unspecified: Secondary | ICD-10-CM | POA: Diagnosis not present

## 2020-07-23 DIAGNOSIS — I482 Chronic atrial fibrillation, unspecified: Secondary | ICD-10-CM | POA: Diagnosis not present

## 2020-07-23 NOTE — Telephone Encounter (Signed)
Received call from patient. He states he is having difficulty with transportation to his next iron transfusion appt in [place]. He uses his [employer] transportation services as he is wheelchair bound.  He asked if I could call and try to convince them to transport him. They usually need 3 day notice. Pt had tried to call on Friday. Call made and was unable to convince them to provide transportation for pt tomorrow. Scheduling message sent for IV iron to be re-scheduled in at least 3 days so pt could make his transportation arnagements.  TCT patient and made him aware of the above. Advised to expect a call from our scheduler to re-schedule his IV iron appt.

## 2020-07-23 NOTE — Telephone Encounter (Signed)
Rescheduled per 4/4 sch msg. Called and spoke with pt confirmed 4/8 appt

## 2020-07-24 ENCOUNTER — Ambulatory Visit: Payer: Medicare Other

## 2020-07-24 DIAGNOSIS — J449 Chronic obstructive pulmonary disease, unspecified: Secondary | ICD-10-CM | POA: Diagnosis not present

## 2020-07-24 DIAGNOSIS — I5032 Chronic diastolic (congestive) heart failure: Secondary | ICD-10-CM | POA: Diagnosis not present

## 2020-07-24 DIAGNOSIS — J441 Chronic obstructive pulmonary disease with (acute) exacerbation: Secondary | ICD-10-CM | POA: Diagnosis not present

## 2020-07-25 DIAGNOSIS — I739 Peripheral vascular disease, unspecified: Secondary | ICD-10-CM | POA: Diagnosis not present

## 2020-07-25 DIAGNOSIS — I482 Chronic atrial fibrillation, unspecified: Secondary | ICD-10-CM | POA: Diagnosis not present

## 2020-07-25 DIAGNOSIS — L97222 Non-pressure chronic ulcer of left calf with fat layer exposed: Secondary | ICD-10-CM | POA: Diagnosis not present

## 2020-07-25 DIAGNOSIS — I509 Heart failure, unspecified: Secondary | ICD-10-CM | POA: Diagnosis not present

## 2020-07-25 DIAGNOSIS — I890 Lymphedema, not elsewhere classified: Secondary | ICD-10-CM | POA: Diagnosis not present

## 2020-07-25 DIAGNOSIS — I110 Hypertensive heart disease with heart failure: Secondary | ICD-10-CM | POA: Diagnosis not present

## 2020-07-25 DIAGNOSIS — I714 Abdominal aortic aneurysm, without rupture: Secondary | ICD-10-CM | POA: Diagnosis not present

## 2020-07-25 DIAGNOSIS — E785 Hyperlipidemia, unspecified: Secondary | ICD-10-CM | POA: Diagnosis not present

## 2020-07-25 DIAGNOSIS — J449 Chronic obstructive pulmonary disease, unspecified: Secondary | ICD-10-CM | POA: Diagnosis not present

## 2020-07-25 DIAGNOSIS — L97522 Non-pressure chronic ulcer of other part of left foot with fat layer exposed: Secondary | ICD-10-CM | POA: Diagnosis not present

## 2020-07-25 DIAGNOSIS — K7460 Unspecified cirrhosis of liver: Secondary | ICD-10-CM | POA: Diagnosis not present

## 2020-07-25 DIAGNOSIS — I6529 Occlusion and stenosis of unspecified carotid artery: Secondary | ICD-10-CM | POA: Diagnosis not present

## 2020-07-25 DIAGNOSIS — L97312 Non-pressure chronic ulcer of right ankle with fat layer exposed: Secondary | ICD-10-CM | POA: Diagnosis not present

## 2020-07-25 DIAGNOSIS — I87333 Chronic venous hypertension (idiopathic) with ulcer and inflammation of bilateral lower extremity: Secondary | ICD-10-CM | POA: Diagnosis not present

## 2020-07-26 ENCOUNTER — Encounter (HOSPITAL_BASED_OUTPATIENT_CLINIC_OR_DEPARTMENT_OTHER): Payer: Medicare Other | Admitting: Internal Medicine

## 2020-07-26 DIAGNOSIS — Z96642 Presence of left artificial hip joint: Secondary | ICD-10-CM | POA: Diagnosis not present

## 2020-07-26 DIAGNOSIS — G894 Chronic pain syndrome: Secondary | ICD-10-CM | POA: Diagnosis not present

## 2020-07-26 DIAGNOSIS — L0390 Cellulitis, unspecified: Secondary | ICD-10-CM | POA: Diagnosis not present

## 2020-07-26 DIAGNOSIS — M5416 Radiculopathy, lumbar region: Secondary | ICD-10-CM | POA: Diagnosis not present

## 2020-07-26 DIAGNOSIS — Z79899 Other long term (current) drug therapy: Secondary | ICD-10-CM | POA: Diagnosis not present

## 2020-07-27 ENCOUNTER — Inpatient Hospital Stay: Payer: Medicare Other | Attending: Physician Assistant

## 2020-07-27 ENCOUNTER — Other Ambulatory Visit: Payer: Self-pay

## 2020-07-27 VITALS — BP 125/53 | HR 81 | Temp 98.6°F | Resp 17

## 2020-07-27 DIAGNOSIS — I4891 Unspecified atrial fibrillation: Secondary | ICD-10-CM | POA: Diagnosis not present

## 2020-07-27 DIAGNOSIS — N183 Chronic kidney disease, stage 3 unspecified: Secondary | ICD-10-CM | POA: Insufficient documentation

## 2020-07-27 DIAGNOSIS — I130 Hypertensive heart and chronic kidney disease with heart failure and stage 1 through stage 4 chronic kidney disease, or unspecified chronic kidney disease: Secondary | ICD-10-CM | POA: Insufficient documentation

## 2020-07-27 DIAGNOSIS — Z87891 Personal history of nicotine dependence: Secondary | ICD-10-CM | POA: Diagnosis not present

## 2020-07-27 DIAGNOSIS — K921 Melena: Secondary | ICD-10-CM | POA: Diagnosis not present

## 2020-07-27 DIAGNOSIS — D509 Iron deficiency anemia, unspecified: Secondary | ICD-10-CM | POA: Diagnosis not present

## 2020-07-27 DIAGNOSIS — I509 Heart failure, unspecified: Secondary | ICD-10-CM | POA: Insufficient documentation

## 2020-07-27 DIAGNOSIS — M7989 Other specified soft tissue disorders: Secondary | ICD-10-CM | POA: Insufficient documentation

## 2020-07-27 DIAGNOSIS — I2510 Atherosclerotic heart disease of native coronary artery without angina pectoris: Secondary | ICD-10-CM | POA: Diagnosis not present

## 2020-07-27 DIAGNOSIS — J449 Chronic obstructive pulmonary disease, unspecified: Secondary | ICD-10-CM | POA: Diagnosis not present

## 2020-07-27 MED ORDER — SODIUM CHLORIDE 0.9 % IV SOLN
200.0000 mg | Freq: Once | INTRAVENOUS | Status: AC
  Administered 2020-07-27: 200 mg via INTRAVENOUS
  Filled 2020-07-27: qty 200

## 2020-07-27 MED ORDER — SODIUM CHLORIDE 0.9 % IV SOLN
Freq: Once | INTRAVENOUS | Status: AC
  Filled 2020-07-27: qty 250

## 2020-07-27 NOTE — Patient Instructions (Signed)

## 2020-07-28 ENCOUNTER — Other Ambulatory Visit: Payer: Self-pay | Admitting: Internal Medicine

## 2020-07-28 DIAGNOSIS — I739 Peripheral vascular disease, unspecified: Secondary | ICD-10-CM | POA: Diagnosis not present

## 2020-07-28 DIAGNOSIS — I482 Chronic atrial fibrillation, unspecified: Secondary | ICD-10-CM | POA: Diagnosis not present

## 2020-07-28 DIAGNOSIS — L97522 Non-pressure chronic ulcer of other part of left foot with fat layer exposed: Secondary | ICD-10-CM | POA: Diagnosis not present

## 2020-07-28 DIAGNOSIS — L97312 Non-pressure chronic ulcer of right ankle with fat layer exposed: Secondary | ICD-10-CM | POA: Diagnosis not present

## 2020-07-28 DIAGNOSIS — I87333 Chronic venous hypertension (idiopathic) with ulcer and inflammation of bilateral lower extremity: Secondary | ICD-10-CM | POA: Diagnosis not present

## 2020-07-28 DIAGNOSIS — I890 Lymphedema, not elsewhere classified: Secondary | ICD-10-CM | POA: Diagnosis not present

## 2020-07-28 DIAGNOSIS — M549 Dorsalgia, unspecified: Secondary | ICD-10-CM | POA: Diagnosis not present

## 2020-07-28 DIAGNOSIS — I6529 Occlusion and stenosis of unspecified carotid artery: Secondary | ICD-10-CM | POA: Diagnosis not present

## 2020-07-28 DIAGNOSIS — G809 Cerebral palsy, unspecified: Secondary | ICD-10-CM | POA: Diagnosis not present

## 2020-07-28 DIAGNOSIS — J441 Chronic obstructive pulmonary disease with (acute) exacerbation: Secondary | ICD-10-CM | POA: Diagnosis not present

## 2020-07-28 DIAGNOSIS — K7460 Unspecified cirrhosis of liver: Secondary | ICD-10-CM | POA: Diagnosis not present

## 2020-07-28 DIAGNOSIS — L97222 Non-pressure chronic ulcer of left calf with fat layer exposed: Secondary | ICD-10-CM | POA: Diagnosis not present

## 2020-07-28 DIAGNOSIS — J449 Chronic obstructive pulmonary disease, unspecified: Secondary | ICD-10-CM | POA: Diagnosis not present

## 2020-07-28 DIAGNOSIS — I714 Abdominal aortic aneurysm, without rupture: Secondary | ICD-10-CM | POA: Diagnosis not present

## 2020-07-28 DIAGNOSIS — I110 Hypertensive heart disease with heart failure: Secondary | ICD-10-CM | POA: Diagnosis not present

## 2020-07-28 DIAGNOSIS — I509 Heart failure, unspecified: Secondary | ICD-10-CM | POA: Diagnosis not present

## 2020-07-28 DIAGNOSIS — E785 Hyperlipidemia, unspecified: Secondary | ICD-10-CM | POA: Diagnosis not present

## 2020-07-30 ENCOUNTER — Other Ambulatory Visit: Payer: Self-pay

## 2020-07-30 ENCOUNTER — Encounter (HOSPITAL_BASED_OUTPATIENT_CLINIC_OR_DEPARTMENT_OTHER): Payer: Medicare Other | Attending: Internal Medicine | Admitting: Internal Medicine

## 2020-07-30 DIAGNOSIS — L97818 Non-pressure chronic ulcer of other part of right lower leg with other specified severity: Secondary | ICD-10-CM | POA: Insufficient documentation

## 2020-07-30 DIAGNOSIS — I87333 Chronic venous hypertension (idiopathic) with ulcer and inflammation of bilateral lower extremity: Secondary | ICD-10-CM | POA: Diagnosis not present

## 2020-07-30 DIAGNOSIS — E1151 Type 2 diabetes mellitus with diabetic peripheral angiopathy without gangrene: Secondary | ICD-10-CM | POA: Diagnosis not present

## 2020-07-30 DIAGNOSIS — G809 Cerebral palsy, unspecified: Secondary | ICD-10-CM | POA: Insufficient documentation

## 2020-07-30 DIAGNOSIS — Z87891 Personal history of nicotine dependence: Secondary | ICD-10-CM | POA: Insufficient documentation

## 2020-07-30 DIAGNOSIS — I482 Chronic atrial fibrillation, unspecified: Secondary | ICD-10-CM | POA: Insufficient documentation

## 2020-07-30 DIAGNOSIS — L97228 Non-pressure chronic ulcer of left calf with other specified severity: Secondary | ICD-10-CM | POA: Diagnosis not present

## 2020-07-30 DIAGNOSIS — I110 Hypertensive heart disease with heart failure: Secondary | ICD-10-CM | POA: Diagnosis not present

## 2020-07-30 DIAGNOSIS — I509 Heart failure, unspecified: Secondary | ICD-10-CM | POA: Insufficient documentation

## 2020-07-30 DIAGNOSIS — I890 Lymphedema, not elsewhere classified: Secondary | ICD-10-CM | POA: Insufficient documentation

## 2020-07-30 DIAGNOSIS — L97312 Non-pressure chronic ulcer of right ankle with fat layer exposed: Secondary | ICD-10-CM | POA: Diagnosis not present

## 2020-07-30 DIAGNOSIS — L97822 Non-pressure chronic ulcer of other part of left lower leg with fat layer exposed: Secondary | ICD-10-CM | POA: Diagnosis not present

## 2020-07-31 DIAGNOSIS — J449 Chronic obstructive pulmonary disease, unspecified: Secondary | ICD-10-CM | POA: Diagnosis not present

## 2020-07-31 DIAGNOSIS — I509 Heart failure, unspecified: Secondary | ICD-10-CM | POA: Diagnosis not present

## 2020-07-31 DIAGNOSIS — I110 Hypertensive heart disease with heart failure: Secondary | ICD-10-CM | POA: Diagnosis not present

## 2020-07-31 DIAGNOSIS — K7460 Unspecified cirrhosis of liver: Secondary | ICD-10-CM | POA: Diagnosis not present

## 2020-07-31 DIAGNOSIS — I87333 Chronic venous hypertension (idiopathic) with ulcer and inflammation of bilateral lower extremity: Secondary | ICD-10-CM | POA: Diagnosis not present

## 2020-07-31 DIAGNOSIS — I739 Peripheral vascular disease, unspecified: Secondary | ICD-10-CM | POA: Diagnosis not present

## 2020-07-31 DIAGNOSIS — I6529 Occlusion and stenosis of unspecified carotid artery: Secondary | ICD-10-CM | POA: Diagnosis not present

## 2020-07-31 DIAGNOSIS — E785 Hyperlipidemia, unspecified: Secondary | ICD-10-CM | POA: Diagnosis not present

## 2020-07-31 DIAGNOSIS — L97312 Non-pressure chronic ulcer of right ankle with fat layer exposed: Secondary | ICD-10-CM | POA: Diagnosis not present

## 2020-07-31 DIAGNOSIS — I482 Chronic atrial fibrillation, unspecified: Secondary | ICD-10-CM | POA: Diagnosis not present

## 2020-07-31 DIAGNOSIS — L97222 Non-pressure chronic ulcer of left calf with fat layer exposed: Secondary | ICD-10-CM | POA: Diagnosis not present

## 2020-07-31 DIAGNOSIS — L97522 Non-pressure chronic ulcer of other part of left foot with fat layer exposed: Secondary | ICD-10-CM | POA: Diagnosis not present

## 2020-07-31 DIAGNOSIS — I890 Lymphedema, not elsewhere classified: Secondary | ICD-10-CM | POA: Diagnosis not present

## 2020-07-31 DIAGNOSIS — I714 Abdominal aortic aneurysm, without rupture: Secondary | ICD-10-CM | POA: Diagnosis not present

## 2020-07-31 NOTE — Progress Notes (Signed)
Harland, LIBORIO SACCENTE ([id number]) Visit Report for 07/30/2020 HPI Details Patient Name: Date of Service: [name] 07/30/2020 12:45 PM Medical Record Number: [id number] Patient Account Number: [id number] Date of Birth/Sex: Treating RN: 1940-06-05 (80 y.o. [name] Primary Care Provider: [name] Other Clinician: Referring Provider: Treating Provider/Extender: [name] in Treatment: 6 History of Present Illness HPI Description: ADMISSION 06/12/2020 This is a 80 year old man who has had wounds on his bilateral lower legs and feet for several months now. Looking in epic he saw his primary physician and early November at which time he had an ulcer of the right calf. He was seen again by his primary doctor on 05/12/2020 at which time he had bilateral lower extremity ulcers. He was reviewed by Dr. Trula Slade of vein and vascular in the spring 2021. He had arterial studies done on 08/12/2019 showing an ABI on the right and 0.74 on the left and 0.69. TBI of the right of 0.29 on the left 0.35 with monophasic waveforms bilaterally. He also had venous reflux studies on 09/29/2019 that did not show evidence of DVT or SVT bilaterally. In the right he had superficial vein reflux in the SSV and mid thigh AASV greater saphenous vein had previously been harvested. On the left he did not have any superficial vein reflux but it was noted that he had interstitial fluid without throughout the lower extremity. He also has a history of CABG, congestive heart failure followed by Dr. Stanford Breed and apparently liver cirrhosis. He takes Lasix 80 mg as well as spironolactone. He arrives in clinic today with 9 wounds. Most of these are superficial and look like they started with blisters. He has areas on the left dorsal foot x2, left posterior calf left anterior tibial left posterior thigh x2 the right medial lower extremity and the right medial malleolus. He has pitting  edema extending well up into his thighs skin changes look like chronic stasis dermatitis. Dr. Trula Slade saw him in May noted that he had tolerated Unna boots and was feeling better. Also noted the moderate arterial insufficiency and suggested if he developed nonhealing wounds then he may need attempted angiography. Past medical history extensive including multiple left hip surgeries, congestive heart failure, cirrhosis, peripheral arterial disease, chronic atrial fibrillation, recurrent cellulitis of the legs, AAA, carotid stenosis, COPD, hypertension and hyperlipidemia We did not attempt his arterial ABIs in our clinic today because of swelling 3/1; this is a patient I admitted to the clinic last week he has multiple wounds on his bilateral lower extremities. I think he has chronic venous insufficiency and PAD. He tolerated the "light" 3 layer wrap I put him on last week and the edema control is better. 06/26/2020 upon evaluation today patient appears to be doing well with regard to his legs overall. He does have several areas of blistering but to be perfectly honest this seems to be making some progress here. Fortunately there is no signs of active infection which is great news. No fevers, chills, nausea, vomiting, or diarrhea. He does have a lot of callus over the great toe that is going require some sharp debridement. I discussed that with him today however I am that I do this extremely likely considering the fact that he does appear to potentially have some peripheral vascular disease specifically of the arterial type. Nonetheless he does have a an appointment on March 30 for formal arterial studies. 3/17; bilateral lower extremity wounds in the setting of chronic venous insufficiency  and arterial insufficiency. He has a follow-up with Dr. Oneida Alar later this month and he is going to have follow-up noninvasive studies. His renal studies done in April 2021 were really very poor with ABIs of 0.74 and  0.69. TBI's less than 0.3 bilateral 3/24; patient has follow-up with Dr. Oneida Alar on March 30. In spite of the arterial studies which are really not very good he is tolerated a light 3 layer compression and we have good edema control. As a result of this he has had healing on the area on the left lateral thigh the posterior wounds on his calves bilaterally are healed. He still has an area on the left great toe right medial ankle 3/31; the patient saw vein and vascular. Arterial studies showed an ABI on the right noncompressible with a TBI of 0.56 on the left he was also noncompressible but a great toe pressure of 0. There was some talk about him undergoing angiography early next week but I do not think the patient was ready for that information. I have told him that this attempt is absolutely necessary. He arrives in clinic today having not had compression on his legs since yesterday. Large tight blisters on the left anterior lower leg less left dorsal foot and 2 smaller areas on the right 4/11; this is a patient with combined lymphedema and chronic venous insufficiency on one side of his circulation with severe arterial insufficiency on the other. He is missed his vein and vascular appointment because of transportation issues yesterday. We have been using 3 layer compression because we can do this to a light degree but home health only uses kerlix Coban. I am concerned about going to any more aggressive compression because of the severe PAD. He is now booked to see vein and vascular on April 25. Electronic Signature(s) Signed: 07/30/2020 4:45:43 PM By: Linton Ham MD Entered By: Linton Ham on 07/30/2020 13:28:28 -------------------------------------------------------------------------------- Physical Exam Details Patient Name: Date of Service: [name], [name] 07/30/2020 12:45 PM Medical Record Number: [id number] Patient Account Number: [id number] Date of Birth/Sex: Treating  RN: [birth year] (80 y.o. [name] Primary Care Provider: [name] Other Clinician: Referring Provider: Treating Provider/Extender: [name] in Treatment: 6 Constitutional Sitting or standing Blood Pressure is within target range for patient.. Pulse regular and within target range for patient.[name] Respirations regular, non-labored and within target range.. Temperature is normal and within the target range for the patient.[name] Appears in no distress. Cardiovascular Pedal pulses absent bilaterally.. Notes Wound exam; multiple blisters on the left leg. He still has 2-3+ pitting edema even though the edema has come down. He has areas on the right leg as well. The area on the left dorsal foot has healed. Electronic Signature(s) Signed: 07/30/2020 4:45:43 PM By: Linton Ham MD Entered By: Linton Ham on 07/30/2020 13:29:54 -------------------------------------------------------------------------------- Physician Orders Details Patient Name: Date of Service: [name], [name] 07/30/2020 12:45 PM Medical Record Number: [id number] Patient Account Number: [id number] Date of Birth/Sex: Treating RN: 1940-07-24 (80 y.o. [name] Primary Care Provider: [name] Other Clinician: Referring Provider: Treating Provider/Extender: [name] in Treatment: 6 Verbal / Phone Orders: No Diagnosis Coding ICD-10 Coding Code Description [phone] Chronic venous hypertension (idiopathic) with ulcer and inflammation of bilateral lower extremity I70.203 Unspecified atherosclerosis of native arteries of extremities, bilateral legs L97.528 Non-pressure chronic ulcer of other part of left foot with other specified severity L97.228 Non-pressure chronic ulcer of left calf with other specified  severity L97.128 Non-pressure chronic ulcer of left thigh with other specified severity L97.818 Non-pressure chronic ulcer  of other part of right lower leg with other specified severity Follow-up Appointments ppointment in 1 week. - ***[facility] - extra time 60 minutes*** Return A Bathing/ Shower/ Hygiene May shower with protection but do not get wound dressing(s) wet. - May use cast protectors over wraps. You can find these at [facility] or [facility] Edema Control - Lymphedema / SCD / Other Elevate legs to the level of the heart or above for 30 minutes daily and/or when sitting, a frequency of: Avoid standing for long periods of time. Compression stocking or Garment 20-30 mm/Hg pressure to: - patient and family to purchase compression stockings. Bring in weekly. do not wear until wound center says to do so. Home Health No change in wound care orders this week; continue Home Health for wound care. May utilize formulary equivalent dressing for wound treatment orders unless otherwise specified. - *****[place] WITH DRESSING CHANGES.*** ****PLEASE APPLY CALICUM ALGINATE AND ABD PADS TO ALL OPEN AND BLISTERED AREAS.**** wound center lightly to apply 3 layer to both legs. Other Home Health Orders/Instructions: - [facility] Wound Treatment Wound #1 - Malleolus Wound Laterality: Right, Medial Cleanser: Wound Cleanser (Home Health) 2 x Per Week/15 Days Discharge Instructions: Cleanse the wound with wound cleanser prior to applying a clean dressing using gauze sponges, not tissue or cotton balls. Cleanser: Soap and Water [facility]) 2 x Per Week/15 Days Discharge Instructions: May shower and wash wound with dial antibacterial soap and water prior to dressing change. Prim Dressing: KerraCel Ag Gelling Fiber Dressing, 4x5 in (silver alginate) (Home Health) 2 x Per Week/15 Days ary Discharge Instructions: Apply silver alginate to wound bed as instructed Secondary Dressing: ABD Pad, 5x9 (Home Health) 2 x Per Week/15 Days Discharge Instructions: Apply over primary dressing as  directed. Compression Wrap: ThreePress (3 layer compression wrap) 2 x Per Week/15 Days Discharge Instructions: ***In clinic only.***Apply LIGHTY three layer compression as directed. Compression Wrap: Kerlix Roll 4.5x3.1 (in/yd) (Home Health) 2 x Per Week/15 Days Discharge Instructions: ****HOME HEALTH TO APPLY AT HOME.****Apply Kerlix and Coban compression as directed. Compression Wrap: Coban Self-Adherent Wrap 4x5 (in/yd) (Home Health) 2 x Per Week/15 Days Discharge Instructions: ****HOME HEALTH TO APPLY AT HOME.****Apply over Kerlix as directed. Wound #11 - T Great oe Wound Laterality: Left Cleanser: Wound Cleanser (Home Health) 2 x Per Week/15 Days Discharge Instructions: Cleanse the wound with wound cleanser prior to applying a clean dressing using gauze sponges, not tissue or cotton balls. Cleanser: Soap and Water [facility]) 2 x Per Week/15 Days Discharge Instructions: May shower and wash wound with dial antibacterial soap and water prior to dressing change. Prim Dressing: KerraCel Ag Gelling Fiber Dressing, 4x5 in (silver alginate) (Home Health) 2 x Per Week/15 Days ary Discharge Instructions: Apply silver alginate to wound bed as instructed Secondary Dressing: Woven Gauze Sponges 2x2 in ([place]) 2 x Per Week/15 Days Discharge Instructions: Apply over primary dressing as directed. Secured With: Child psychotherapist, Sterile 2x75 (in/in) (Home Health) 2 x Per Week/15 Days Discharge Instructions: Secure with stretch gauze as directed. Secured With: Paper Tape, 2x10 (in/yd) (Home Health) 2 x Per Week/15 Days Discharge Instructions: Secure dressing with tape as directed. Wound #12 - Lower Leg Wound Laterality: Left, Medial Cleanser: Wound Cleanser (Home Health) 2 x Per Week/15 Days Discharge Instructions: Cleanse the wound with wound cleanser prior to applying a clean  dressing using gauze sponges, not tissue or cotton balls. Cleanser: Soap and Water [facility]) 2 x  Per Week/15 Days Discharge Instructions: May shower and wash wound with dial antibacterial soap and water prior to dressing change. Prim Dressing: KerraCel Ag Gelling Fiber Dressing, 4x5 in (silver alginate) (Home Health) 2 x Per Week/15 Days ary Discharge Instructions: Apply silver alginate to wound bed as instructed Secondary Dressing: ABD Pad, 5x9 (Home Health) 2 x Per Week/15 Days Discharge Instructions: Apply over primary dressing as directed. Compression Wrap: ThreePress (3 layer compression wrap) 2 x Per Week/15 Days Discharge Instructions: ***In clinic only.***Apply LIGHTY three layer compression as directed. Compression Wrap: Kerlix Roll 4.5x3.1 (in/yd) (Home Health) 2 x Per Week/15 Days Discharge Instructions: ****HOME HEALTH TO APPLY AT HOME.****Apply Kerlix and Coban compression as directed. Compression Wrap: Coban Self-Adherent Wrap 4x5 (in/yd) (Home Health) 2 x Per Week/15 Days Discharge Instructions: ****HOME HEALTH TO APPLY AT HOME.****Apply over Kerlix as directed. Electronic Signature(s) Signed: 07/30/2020 4:45:43 PM By: Linton Ham MD Signed: 07/30/2020 5:30:45 PM By: Levan Hurst RN, BSN Entered By: Levan Hurst on 07/30/2020 13:20:20 -------------------------------------------------------------------------------- Problem List Details Patient Name: Date of Service: [name], [name] 07/30/2020 12:45 PM Medical Record Number: [id number] Patient Account Number: [id number] Date of Birth/Sex: Treating RN: [birth year] (80 y.o. 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 06/12/2020 No Yes L97.818 Non-pressure chronic ulcer of other part of right lower leg with other specified 06/12/2020 No Yes severity E11.9 Type 2 diabetes mellitus without complications 6/38/7564 No Yes Inactive Problems ICD-10 Code Description Active Date Inactive Date L97.528 Non-pressure chronic ulcer of other part of left foot with other specified severity 06/12/2020 06/12/2020 L97.128 Non-pressure chronic ulcer of left thigh with other specified severity 06/12/2020 06/12/2020 Resolved Problems Electronic Signature(s) Signed: 07/30/2020 4:45:43 PM By: Linton Ham MD Entered By: Linton Ham on 07/30/2020 14:01:27 -------------------------------------------------------------------------------- Progress Note Details Patient Name: Date of Service: [name], [name] 07/30/2020 12:45 PM Medical Record Number: [id number] Patient Account Number: [id number] Date of Birth/Sex: Treating RN: February 17, 1941 (80 y.o. [name] Primary Care Provider: [name] Other Clinician: Referring Provider: Treating Provider/Extender: [name] in Treatment: 6 Subjective History of Present Illness (HPI) ADMISSION 06/12/2020 This is a 80 year old man who has had wounds on his bilateral lower legs and feet for several months now. Looking in epic he saw his primary physician and early November at which time he had an ulcer of the right calf. He was seen again by his primary doctor on 05/12/2020 at which time he had bilateral lower extremity ulcers. He was reviewed by Dr. Trula Slade of vein and vascular in the spring 2021. He had arterial studies done on 08/12/2019 showing an ABI on the right and 0.74 on the left and 0.69. TBI of the right of 0.29 on the left 0.35 with monophasic waveforms bilaterally. He also had venous reflux studies  on 09/29/2019 that did not show evidence of DVT or SVT bilaterally. In the right he had superficial vein reflux in the SSV and mid thigh AASV greater saphenous vein had previously been harvested. On the left he  did not have any superficial vein reflux but it was noted that he had interstitial fluid without throughout the lower extremity. He also has a history of CABG, congestive heart failure followed by Dr. Stanford Breed and apparently liver cirrhosis. He takes Lasix 80 mg as well as spironolactone. He arrives in clinic today with 9 wounds. Most of these are superficial and look like they started with blisters. He has areas on the left dorsal foot x2, left posterior calf left anterior tibial left posterior thigh x2 the right medial lower extremity and the right medial malleolus. He has pitting edema extending well up into his thighs skin changes look like chronic stasis dermatitis. Dr. Trula Slade saw him in May noted that he had tolerated Unna boots and was feeling better. Also noted the moderate arterial insufficiency and suggested if he developed nonhealing wounds then he may need attempted angiography. Past medical history extensive including multiple left hip surgeries, congestive heart failure, cirrhosis, peripheral arterial disease, chronic atrial fibrillation, recurrent cellulitis of the legs, AAA, carotid stenosis, COPD, hypertension and hyperlipidemia We did not attempt his arterial ABIs in our clinic today because of swelling 3/1; this is a patient I admitted to the clinic last week he has multiple wounds on his bilateral lower extremities. I think he has chronic venous insufficiency and PAD. He tolerated the "light" 3 layer wrap I put him on last week and the edema control is better. 06/26/2020 upon evaluation today patient appears to be doing well with regard to his legs overall. He does have several areas of blistering but to be perfectly honest this seems to be making some progress here.  Fortunately there is no signs of active infection which is great news. No fevers, chills, nausea, vomiting, or diarrhea. He does have a lot of callus over the great toe that is going require some sharp debridement. I discussed that with him today however I am that I do this extremely likely considering the fact that he does appear to potentially have some peripheral vascular disease specifically of the arterial type. Nonetheless he does have a an appointment on March 30 for formal arterial studies. 3/17; bilateral lower extremity wounds in the setting of chronic venous insufficiency and arterial insufficiency. He has a follow-up with Dr. Oneida Alar later this month and he is going to have follow-up noninvasive studies. His renal studies done in April 2021 were really very poor with ABIs of 0.74 and 0.69. TBI's less than 0.3 bilateral 3/24; patient has follow-up with Dr. Oneida Alar on March 30. In spite of the arterial studies which are really not very good he is tolerated a light 3 layer compression and we have good edema control. As a result of this he has had healing on the area on the left lateral thigh the posterior wounds on his calves bilaterally are healed. He still has an area on the left great toe right medial ankle 3/31; the patient saw vein and vascular. Arterial studies showed an ABI on the right noncompressible with a TBI of 0.56 on the left he was also noncompressible but a great toe pressure of 0. There was some talk about him undergoing angiography early next week but I do not think the patient was ready for that information. I have told him that this attempt is absolutely necessary. He arrives in clinic today having not had compression on his legs since yesterday. Large tight blisters on the left anterior lower leg less left dorsal foot and 2 smaller areas on the right 4/11;  this is a patient with combined lymphedema and chronic venous insufficiency on one side of his circulation with severe  arterial insufficiency on the other. He is missed his vein and vascular appointment because of transportation issues yesterday. We have been using 3 layer compression because we can do this to a light degree but home health only uses kerlix Coban. I am concerned about going to any more aggressive compression because of the severe PAD. He is now booked to see vein and vascular on April 25. Objective Constitutional Sitting or standing Blood Pressure is within target range for patient.. Pulse regular and within target range for patient.[name] Respirations regular, non-labored and within target range.. Temperature is normal and within the target range for the patient.[name] Appears in no distress. Vitals Time Taken: 12:56 PM, Height: 71 in, Weight: 178 lbs, BMI: 24.8, Temperature: 97.5 F, Pulse: 79 bpm, Respiratory Rate: 16 breaths/min, Blood Pressure: 120/66 mmHg. Cardiovascular Pedal pulses absent bilaterally.. General Notes: Wound exam; multiple blisters on the left leg. He still has 2-3+ pitting edema even though the edema has come down. He has areas on the right leg as well. The area on the left dorsal foot has healed. Integumentary (Hair, Skin) Wound #1 status is Open. Original cause of wound was Gradually Appeared. The date acquired was: 11/20/2019. The wound has been in treatment 6 weeks. The wound is located on the Right,Medial Malleolus. The wound measures 0.7cm length x 1.1cm width x 0.1cm depth; 0.605cm^2 area and 0.06cm^3 volume. There is Fat Layer (Subcutaneous Tissue) exposed. There is no tunneling or undermining noted. There is a medium amount of serosanguineous drainage noted. The wound margin is distinct with the outline attached to the wound base. There is large (67-100%) red granulation within the wound bed. There is a small (1-33%) amount of necrotic tissue within the wound bed including Adherent Slough. Wound #11 status is Open. Original cause of wound was Gradually Appeared. The date  acquired was: 06/26/2020. The wound has been in treatment 4 weeks. The wound is located on the Left T Great. The wound measures 0.7cm length x 0.4cm width x 0.3cm depth; 0.22cm^2 area and 0.066cm^3 volume. There is Fat oe Layer (Subcutaneous Tissue) exposed. There is no tunneling or undermining noted. There is a medium amount of serosanguineous drainage noted. The wound margin is distinct with the outline attached to the wound base. There is large (67-100%) pink, pale granulation within the wound bed. There is a small (1-33%) amount of necrotic tissue within the wound bed including Adherent Slough. Wound #12 status is Open. Original cause of wound was Blister. The date acquired was: 07/18/2020. The wound has been in treatment 1 weeks. The wound is located on the Left,Medial Lower Leg. The wound measures 13.8cm length x 16cm width x 0.1cm depth; 173.416cm^2 area and 17.342cm^3 volume. There is Fat Layer (Subcutaneous Tissue) exposed. There is no tunneling or undermining noted. There is a medium amount of serosanguineous drainage noted. The wound margin is distinct with the outline attached to the wound base. There is large (67-100%) red granulation within the wound bed. There is a small (1-33%) amount of necrotic tissue within the wound bed including Adherent Slough. Wound #4 status is Healed - Epithelialized. Original cause of wound was Blister. The date acquired was: 11/20/2019. The wound has been in treatment 6 weeks. The wound is located on the Left,Dorsal Foot. The wound measures 0cm length x 0cm width x 0cm depth; 0cm^2 area and 0cm^3 volume. 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 There was a Three Layer Compression Therapy Procedure by Levan Hurst, RN. Post procedure Diagnosis Wound #1: Same as Pre-Procedure Wound #12 Pre-procedure diagnosis of Wound #12 is a Lymphedema located on the Left,Medial Lower Leg . There was a Three Layer Compression Therapy Procedure by Levan Hurst, RN. Post procedure Diagnosis Wound #12: Same as Pre-Procedure Plan Follow-up Appointments: Return Appointment in 1 week. - ***[facility] - extra time 60 minutes*** Bathing/ Shower/ Hygiene: May shower with protection but do not get wound dressing(s) wet. - May use cast protectors over wraps. You can find these at [facility] or [facility] Edema Control - Lymphedema / SCD / Other: Elevate legs to the level of the heart or above for 30 minutes daily and/or when sitting, a frequency of: Avoid standing for long periods of time. Compression stocking or Garment 20-30 mm/Hg pressure to: - patient and family to purchase compression stockings. Bring in weekly. do not wear until wound center says to do so. Home Health: No change in wound care orders this week; continue Home Health for wound care. May utilize formulary equivalent dressing for wound treatment orders unless otherwise specified. - *****[place] WITH DRESSING CHANGES.*** ****PLEASE APPLY CALICUM ALGINATE AND ABD PADS TO ALL OPEN AND BLISTERED AREAS.**** wound center lightly to apply 3 layer to both legs. Other Home Health Orders/Instructions: - [facility] WOUND #1: - Malleolus Wound Laterality: Right, Medial Cleanser: Wound Cleanser (Home Health) 2 x Per Week/15 Days Discharge Instructions: Cleanse the wound with wound cleanser prior to applying a clean dressing using gauze sponges, not tissue or cotton balls. Cleanser: Soap and Water [facility]) 2 x Per Week/15 Days Discharge  Instructions: May shower and wash wound with dial antibacterial soap and water prior to dressing change. Prim Dressing: KerraCel Ag Gelling Fiber Dressing, 4x5 in (silver alginate) (Home Health) 2 x Per Week/15 Days ary Discharge Instructions: Apply silver alginate to wound bed as instructed Secondary Dressing: ABD Pad, 5x9 (Home Health) 2 x Per Week/15 Days Discharge Instructions: Apply over primary dressing as directed. Com pression Wrap: ThreePress (3 layer compression wrap) 2 x Per Week/15 Days Discharge Instructions: ***In clinic only.***Apply LIGHTY three layer compression as directed. Com pression Wrap: Kerlix Roll 4.5x3.1 (in/yd) (Home Health) 2 x Per Week/15 Days Discharge Instructions: ****HOME HEALTH TO APPLY AT HOME.****Apply Kerlix and Coban compression as directed. Com pression Wrap: Coban Self-Adherent Wrap 4x5 (in/yd) (Home Health) 2 x Per Week/15 Days Discharge Instructions: ****HOME HEALTH TO APPLY AT HOME.****Apply over Kerlix as directed. WOUND #11: - T Great Wound Laterality: Left oe Cleanser: Wound Cleanser (Home Health) 2 x Per Week/15 Days Discharge Instructions: Cleanse the wound with wound cleanser prior to applying a clean dressing using gauze sponges, not tissue or cotton balls. Cleanser: Soap and Water [facility]) 2 x Per Week/15 Days Discharge Instructions: May shower and wash wound with dial antibacterial soap and water prior to dressing change. Prim Dressing: KerraCel Ag Gelling Fiber Dressing, 4x5 in (silver alginate) (Home Health) 2 x Per Week/15 Days ary Discharge Instructions: Apply silver alginate to wound bed as instructed Secondary Dressing: Woven Gauze Sponges 2x2  in [facility]) 2 x Per Week/15 Days Discharge Instructions: Apply over primary dressing as directed. Secured With: Child psychotherapist, Sterile 2x75 (in/in) (Home Health) 2 x Per Week/15 Days Discharge Instructions: Secure with stretch gauze as directed. Secured With: Paper T  ape, 2x10 (in/yd) (Home Health) 2 x Per Week/15 Days Discharge Instructions: Secure dressing with tape as directed. WOUND #12: - Lower Leg Wound Laterality: Left, Medial Cleanser: Wound Cleanser (Home Health) 2 x Per Week/15 Days Discharge Instructions: Cleanse the wound with wound cleanser prior to applying a clean dressing using gauze sponges, not tissue or cotton balls. Cleanser: Soap and Water [facility]) 2 x Per Week/15 Days Discharge Instructions: May shower and wash wound with dial antibacterial soap and water prior to dressing change. Prim Dressing: KerraCel Ag Gelling Fiber Dressing, 4x5 in (silver alginate) (Home Health) 2 x Per Week/15 Days ary Discharge Instructions: Apply silver alginate to wound bed as instructed Secondary Dressing: ABD Pad, 5x9 (Home Health) 2 x Per Week/15 Days Discharge Instructions: Apply over primary dressing as directed. Com pression Wrap: ThreePress (3 layer compression wrap) 2 x Per Week/15 Days Discharge Instructions: ***In clinic only.***Apply LIGHTY three layer compression as directed. Com pression Wrap: Kerlix Roll 4.5x3.1 (in/yd) (Home Health) 2 x Per Week/15 Days Discharge Instructions: ****HOME HEALTH TO APPLY AT HOME.****Apply Kerlix and Coban compression as directed. Com pression Wrap: Coban Self-Adherent Wrap 4x5 (in/yd) (Home Health) 2 x Per Week/15 Days Discharge Instructions: ****HOME HEALTH TO APPLY AT HOME.****Apply over Kerlix as directed. 1. At this point I do not know that I have too much choice. I am going to continue the silver alginate-based dressings. Predominantly wounds on the right medial left anterior and left medial lower extremities. These are actually probably mostly blisters. He does not have a no option for more aggressive compression at this point he is already complaining of what may be claudication in the left leg. HOWEVER we have managed to heal some of his wounds out by controlling the edema in his legs. I think we are  walking a tight rope here I do not think there is any wiggle room for more compression 2. He does have home health. I do not trust home health to put on adjusted 3 layer compression therefore we have been using kerlix Coban and until we get through vein and vascular I do not know we have too much choice here 3. Follow-up in 2-week Electronic Signature(s) Signed: 07/30/2020 4:45:43 PM By: Linton Ham MD Entered By: Linton Ham on 07/30/2020 13:31:25 -------------------------------------------------------------------------------- SuperBill Details Patient Name: Date of Service: [name], [name] 07/30/2020 Medical Record Number: [id number] Patient Account Number: [id number] Date of Birth/Sex: Treating RN: 25-Dec-1940 (80 y.o. 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 Modifier: Quantity: 1 Physician Procedures Electronic Signature(s) Signed: 07/30/2020 5:30:45 PM By: Levan Hurst RN, BSN Signed: 07/31/2020 4:33:39 PM By: Linton Ham MD Previous Signature: 07/30/2020 4:45:43 PM Version By: Linton Ham MD Entered By:  Levan Hurst on 07/30/2020 17:10:58

## 2020-08-01 ENCOUNTER — Other Ambulatory Visit: Payer: Self-pay | Admitting: Internal Medicine

## 2020-08-01 NOTE — Telephone Encounter (Signed)
Ok to send the order? Please advise

## 2020-08-01 NOTE — Telephone Encounter (Signed)
    Daughter calling to request order for transfer bench and rolling side table be sent to [facility]

## 2020-08-01 NOTE — Progress Notes (Signed)
[name], [name] ([id number]) Visit Report for 07/30/2020 Arrival Information Details Patient Name: Date of Service: [name] 07/30/2020 12:45 PM Medical Record Number: [id number] Patient Account Number: [id number] Date of Birth/Sex: Treating RN: Feb 17, 1941 (80 y.o. [name] Primary Care [name]: [name] Other Clinician: Referring [name]: Treating [name]/Extender: [name]: 6 Visit Information History Since Last Visit Added or deleted any medications: No Patient Arrived: Wheel Chair Any new allergies or adverse reactions: No Arrival Time: 13:00 Had a fall or experienced change in No Transfer Assistance: [employer] streamer activities of daily living that may affect Patient Identification Verified: Yes risk of falls: Secondary Verification Process Completed: Yes Signs or symptoms of abuse/neglect since last visito No Patient Requires Transmission-Based Precautions: No Hospitalized since last visit: No Patient Has Alerts: No Implantable device outside of the clinic excluding No cellular tissue based products placed in the center since last visit: Has Dressing in Place as Prescribed: Yes Has Compression in Place as Prescribed: Yes Pain Present Now: Yes Electronic Signature(s) Signed: 07/30/2020 1:00:43 PM By: [name] Entered By: [name] on 07/30/2020 13:00:43 -------------------------------------------------------------------------------- Compression Therapy Details Patient Name: Date of Service: Brian RNER, [name] 07/30/2020 12:45 PM Medical Record Number: [id number] Patient Account Number: [id number] Date of Birth/Sex: Treating RN: 01-18-1941 (80 y.o. [name] Primary Care [name]: [name] Other Clinician: Referring [name]: Treating [name]/Extender: [name]: 6 Compression Therapy Performed for Wound Assessment:  Wound #1 Right,Medial Malleolus Performed By: Clinician Brian Hurst, RN Compression Type: Three Layer Post Procedure Diagnosis Same as Pre-procedure Electronic Signature(s) Signed: 07/30/2020 5:30:45 PM By: Brian Hurst RN, BSN Entered By: [name] on 07/30/2020 13:21:00 -------------------------------------------------------------------------------- Compression Therapy Details Patient Name: Date of Service: Brian RNER, [name] 07/30/2020 12:45 PM Medical Record Number: [id number] Patient Account Number: [id number] Date of Birth/Sex: Treating RN: 12-19-1940 (79 y.o. [name] Primary Care Antwyne Pingree: [name] Other Clinician: Referring [name]: Treating [name]/Extender: [name]: 6 Compression Therapy Performed for Wound Assessment: Wound #12 Left,Medial Lower Leg Performed By: Clinician Brian Hurst, RN Compression Type: Three Layer Post Procedure Diagnosis Same as Pre-procedure Electronic Signature(s) Signed: 07/30/2020 5:30:45 PM By: Brian Hurst RN, BSN Entered By: [name] on 07/30/2020 13:21:00 -------------------------------------------------------------------------------- Encounter Discharge Information Details Patient Name: Date of Service: Brian RNER, [name] 07/30/2020 12:45 PM Medical Record Number: [id number] Patient Account Number: [id number] Date of Birth/Sex: Treating RN: [birth year] (80 y.o. [name] Primary Care [name]: [name] Other Clinician: Referring [name]: Treating [name]: [name]: 6 Encounter Discharge Information Items Discharge Condition: Stable Ambulatory Status: Wheelchair Discharge Destination: Home Transportation: Private Auto Accompanied By: self Schedule Follow-up Appointment: Yes Clinical Summary of Care: Electronic Signature(s) Signed: 07/30/2020 5:44:53 PM By: [name] Entered By: [name] on 07/30/2020 14:19:02 -------------------------------------------------------------------------------- Lower Extremity Assessment Details Patient Name: Date of Service: [name], [name] 07/30/2020 12:45 PM Medical Record Number: [id number] Patient Account Number: [id number] Date of Birth/Sex: Treating RN: October 11, 1940 (80 y.o. [name] Primary Care [name]: [name] Other Clinician: Referring [name]: Treating [name]/Extender: [name]: 6 Edema Assessment Assessed: [name]: Yes] [name]: Yes] Edema: [Left: Yes] [Right: Yes] Calf Left: Right: Point of Measurement: 29 cm From Medial Instep 28.8 cm 31.5 cm Ankle Left: Right: Point of Measurement: 10 cm From Medial Instep 23 cm 20 cm Vascular Assessment Pulses: Dorsalis Pedis Palpable: [Left:No] [  Right:No] Electronic Signature(s) Signed: 07/30/2020 1:02:17 PM By: [name] Entered By: [name] on 07/30/2020 13:02:17 -------------------------------------------------------------------------------- Multi Wound Chart Details Patient Name: Date of Service: Brian RNER, [name] 07/30/2020 12:45 PM Medical Record Number: [id number] Patient Account Number: [id number] Date of Birth/Sex: Treating RN: [birth year] (80 y.o. [name] Primary Care [name]: [name] Other Clinician: Referring [name]: Treating [name]/Extender: [name]: 6 Vital Signs Height(in): 83 Pulse(bpm): 64 Weight(lbs): 178 Blood Pressure(mmHg): 120/66 Body Mass Index(BMI): 25 Temperature(F): 97.5 Respiratory Rate(breaths/min): 16 Photos: [1:No Photos Right, Medial Malleolus] [11:No Photos Left T Great oe] [12:No Photos Left, Medial Lower Leg] Wound Location: [1:Gradually Appeared] [11:Gradually Appeared] [12:Blister] Wounding Event: [1:Venous Leg Ulcer] [11:Lymphedema]  [12:Lymphedema] Primary Etiology: [1:Lymphedema] [11:N/A] [12:N/A] 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, Chronic Obstructive] [11:Anemia, Chronic Obstructive] [12:Anemia, Chronic Obstructive] Comorbid History: [1:Pulmonary Disease (COPD), Arrhythmia, Congestive Heart Failure, Arrhythmia, Congestive Heart Failure, Arrhythmia, Congestive Heart Failure, Coronary Artery Disease, Hypertension, Cirrhosis , Osteoarthritis Hypertension, Cirrhosis ,  Osteoarthritis Hypertension, Cirrhosis , Osteoarthritis 11/20/2019] [11:Pulmonary Disease (COPD), Coronary Artery Disease, 06/26/2020] [12:Pulmonary Disease (COPD), Coronary Artery Disease, 07/18/2020] Date Acquired: [1:6] [11:4] [12:1] Weeks of Mcdowell: [1:Open] [11:Open] [12:Open] Wound Status: [1:0.7x1.1x0.1] [11:0.7x0.4x0.3] [12:13.8x16x0.1] Measurements L x W x D (cm) [1:0.605] [11:0.22] [12:173.416] A (cm) : rea [1:0.06] [11:0.066] [12:17.342] Volume (cm) : [1:94.90%] [11:-178.50%] [12:-530.90%] % Reduction in Area: [1:95.00%] [11:-725.00%] [12:-530.80%] % Reduction in Volume: [1:Full Thickness Without Exposed] [11:Full Thickness Without Exposed] [12:Full Thickness Without Exposed] Classification: [1:Support Structures Medium] [11:Support Structures Medium] [12:Support Structures Medium] Exudate Amount: [1:Serosanguineous] [11:Serosanguineous] [12:Serosanguineous] Exudate Type: [1:red, brown] [11:red, brown] [12:red, brown] Exudate Color: [1:Distinct, outline attached] [11:Distinct, outline attached] [12:Distinct, outline attached] Wound Margin: [1:Large (67-100%)] [11:Large (67-100%)] [12:Large (67-100%)] Granulation Amount: [1:Red] [11:Pink, Pale] [12:Red] Granulation Quality: [1:Small (1-33%)] [11:Small (1-33%)] [12:Small (1-33%)] Necrotic Amount: [1:Fat Layer (Subcutaneous Tissue): Yes Fat Layer (Subcutaneous Tissue): Yes Fat Layer (Subcutaneous Tissue): Yes] Exposed Structures: [1:Fascia: No Tendon: No Muscle: No Joint: No Bone: No Medium (34-66%)]  [11:Fascia: No Tendon: No Muscle: No Joint: No Bone: No None] [12:Fascia: No Tendon: No Muscle: No Joint: No Bone: No None] Epithelialization: [1:Compression Therapy] [11:N/A] [12:Compression [place] Wound Number: 4 N/A N/A Photos: No Photos N/A N/A Left, Dorsal Foot N/A N/A Wound Location: Blister N/A N/A Wounding Event: Lymphedema N/A N/A Primary Etiology: N/A N/A N/A Secondary Etiology: Anemia, Chronic Obstructive N/A N/A Comorbid History: Pulmonary Disease (COPD), Arrhythmia, Congestive Heart Failure, Coronary Artery Disease, Hypertension, Cirrhosis , Osteoarthritis 11/20/2019 N/A N/A Date Acquired: 6 N/A N/A Weeks of Mcdowell: Healed - Epithelialized N/A N/A Wound Status: 0x0x0 N/A N/A Measurements L x W x D (cm) 0 N/A N/A A (cm) : rea 0 N/A N/A Volume (cm) : 100.00% N/A N/A % Reduction in Area: 100.00% N/A N/A % Reduction in Volume: Full Thickness Without Exposed N/A N/A Classification: Support Structures N/A N/A N/A Exudate A mount: N/A N/A N/A Exudate Type: N/A N/A N/A Exudate Color: N/A N/A N/A Wound Margin: N/A N/A N/A Granulation A mount: N/A N/A N/A Granulation Quality: N/A N/A N/A Necrotic A mount: N/A N/A N/A Exposed Structures: N/A N/A N/A Epithelialization: N/A N/A N/A Procedures Performed: Mcdowell Notes Electronic Signature(s) Signed: 07/30/2020 4:45:43 PM By: Linton Ham MD Signed: 07/30/2020 5:30:45 PM By: Brian Hurst RN, BSN Entered By: Linton Ham on 07/30/2020 14:01:36 -------------------------------------------------------------------------------- Multi-Disciplinary Care Plan Details Patient Name: Date of Service: Brian RNER, [name] 07/30/2020 12:45 PM Medical Record Number: [id number] Patient Account Number: [id number] Date of Birth/Sex: Treating RN: Mar 29, 1941 (80 y.o. M)  [name] Primary Care Gianmarco Roye: [name] Other Clinician: Referring Climmie Cronce: Treating Tiago Humphrey/Extender: [name]: 6 Active Inactive Wound/Skin Impairment Nursing Diagnoses: Impaired tissue integrity Knowledge deficit related to ulceration/compromised skin integrity Goals: Patient will have a decrease in wound volume by X% from date: (specify in notes) Date Initiated: 06/12/2020 Target Resolution Date: 08/17/2020 Goal Status: Active Patient/caregiver will verbalize understanding of skin care regimen Date Initiated: 06/12/2020 Target Resolution Date: 08/17/2020 Goal Status: Active Ulcer/skin breakdown will have a volume reduction of 30% by week 4 Date Initiated: 06/12/2020 Date Inactivated: 07/05/2020 Target Resolution Date: 06/29/2020 Unmet Reason: see wound Goal Status: Unmet measurements. Interventions: Assess patient/caregiver ability to obtain necessary supplies Assess patient/caregiver ability to perform ulcer/skin care regimen upon admission and as needed Assess ulceration(s) every visit Notes: Electronic Signature(s) Signed: 07/30/2020 5:30:45 PM By: Brian Hurst RN, BSN Entered By: [name] on 07/30/2020 12:57:50 -------------------------------------------------------------------------------- Pain Assessment Details Patient Name: Date of Service: Brian RNER, [name] 07/30/2020 12:45 PM Medical Record Number: [id number] Patient Account Number: [id number] Date of Birth/Sex: Treating RN: Aug 18, 1940 (80 y.o. [name] Primary Care Gunner Iodice: [name] Other Clinician: Referring Tearra Ouk: Treating Zoelle Markus/Extender: [name]: 6 Active Problems Location of Pain Severity and Description of Pain Patient Has Paino Yes Site Locations Pain Location: Pain in Ulcers With Dressing Change: Yes Duration of the Pain. Constant / Intermittento Intermittent Rate the pain. Current Pain Level: 5 Character of Pain Describe the Pain: Burning, Tender, Throbbing Pain Management and  Medication Current Pain Management: Medication: No Cold Application: No Rest: Yes Massage: No Activity: No T.McdowellN.S.: No Heat Application: No Leg drop or elevation: No Is the Current Pain Management Adequate: Inadequate How does your wound impact your activities of daily livingo Sleep: No Bathing: No Appetite: No Relationship With Others: No Bladder Continence: No Emotions: No Bowel Continence: No Work: No Toileting: No Drive: No Dressing: No Hobbies: No Electronic Signature(s) Signed: 07/30/2020 1:01:35 PM By: [name] Entered By: [name] on 07/30/2020 13:01:35 -------------------------------------------------------------------------------- Patient/Caregiver Education Details Patient Name: Date of Service: Brian RNER, CHA Lamar Blinks 4/11/2022andnbsp12:45 PM Medical Record Number: [id number] Patient Account Number: [id number] Date of Birth/Gender: Treating RN: 06-22-1940 (80 y.o. [name] Primary Care Physician: [name] Other Clinician: Referring Physician: Treating Physician/Extender: [name]: 6 Education Assessment Education Provided To: Patient Education Topics Provided Wound/Skin Impairment: Methods: Explain/Verbal Responses: State content correctly Electronic Signature(s) Signed: 07/30/2020 5:30:45 PM By: Brian Hurst RN, BSN Entered By: [name] on 07/30/2020 12:58:00 -------------------------------------------------------------------------------- Wound Assessment Details Patient Name: Date of Service: Brian RNER, [name] 07/30/2020 12:45 PM Medical Record Number: [id number] Patient Account Number: [id number] Date of Birth/Sex: Treating RN: 01-01-1941 (80 y.o. [name] Primary Care Nadim Malia: [name] Other Clinician: Referring Sher Hellinger: Treating Lilliann Rossetti/Extender: [name]: 6 Wound Status Wound Number: 1  Primary Venous Leg Ulcer Etiology: Wound Location: Right, Medial Malleolus Secondary Lymphedema Wounding Event: Gradually Appeared Etiology: Date Acquired: 11/20/2019 Wound Open Weeks Of Mcdowell: 6 Status: Clustered Wound: No Comorbid Anemia, Chronic Obstructive Pulmonary Disease (COPD), History: Arrhythmia, Congestive Heart Failure, Coronary Artery Disease, Hypertension, Cirrhosis , Osteoarthritis Photos Wound Measurements Length: (cm) 0.7 Width: (cm) 1.1 Depth: (cm) 0.1 Area: (cm) 0.605 Volume: (cm) 0.06 % Reduction in Area: 94.9% % Reduction in Volume: 95% Epithelialization: Medium (34-66%) Tunneling: No Undermining: No Wound Description Classification: Full Thickness Without Exposed Support Structures Wound Margin: Distinct, outline attached Exudate Amount:  Medium Exudate Type: Serosanguineous Exudate Color: red, brown Foul Odor After Cleansing: No Slough/Fibrino Yes Wound Bed Granulation Amount: Large (67-100%) Exposed Structure Granulation Quality: Red Fascia Exposed: No Necrotic Amount: Small (1-33%) Fat Layer (Subcutaneous Tissue) Exposed: Yes Necrotic Quality: Adherent Slough Tendon Exposed: No Muscle Exposed: No Joint Exposed: No Bone Exposed: No Mcdowell Notes Wound #1 (Malleolus) Wound Laterality: Right, Medial Cleanser Wound Cleanser Discharge Instruction: Cleanse the wound with wound cleanser prior to applying a clean dressing using gauze sponges, not tissue or cotton balls. Soap and Water Discharge Instruction: May shower and wash wound with dial antibacterial soap and water prior to dressing change. Peri-Wound Care Topical Primary Dressing KerraCel Ag Gelling Fiber Dressing, 4x5 in (silver alginate) Discharge Instruction: Apply silver alginate to wound bed as instructed Secondary Dressing ABD Pad, 5x9 Discharge Instruction: Apply over primary dressing as directed. Secured With Compression Wrap ThreePress (3 layer compression  wrap) Discharge Instruction: ***In clinic only.***Apply LIGHTY three layer compression as directed. Kerlix Roll 4.5x3.1 (in/yd) Discharge Instruction: ****HOME HEALTH TO APPLY AT HOME.****Apply Kerlix and Coban compression as directed. Coban Self-Adherent Wrap 4x5 (in/yd) Discharge Instruction: ****[place] AT HOME.****Apply over Kerlix as directed. Compression Stockings Add-Ons Electronic Signature(s) Signed: 07/30/2020 5:21:09 PM By: [name] Signed: 08/01/2020 8:29:36 AM By: Sandre Kitty Previous Signature: 07/30/2020 1:03:16 PM Version By: [name] Entered By: Sandre Kitty on 07/30/2020 16:43:52 -------------------------------------------------------------------------------- Wound Assessment Details Patient Name: Date of Service: Brian RNER, [name] 07/30/2020 12:45 PM Medical Record Number: [id number] Patient Account Number: [id number] Date of Birth/Sex: Treating RN: 10/31/1940 (80 y.o. [name] Primary Care Claus Silvestro: [name] Other Clinician: Referring Remmy Crass: Treating Ranesha Val/Extender: [name]: 6 Wound Status Wound Number: 11 Primary Lymphedema Etiology: Wound Location: Left T Great oe Wound Open Wounding Event: Gradually Appeared Status: Date Acquired: 06/26/2020 Comorbid Anemia, Chronic Obstructive Pulmonary Disease (COPD), Weeks Of Mcdowell: 4 History: Arrhythmia, Congestive Heart Failure, Coronary Artery Disease, Clustered Wound: No Hypertension, Cirrhosis , Osteoarthritis Photos Wound Measurements Length: (cm) 0.7 Width: (cm) 0.4 Depth: (cm) 0.3 Area: (cm) 0.22 Volume: (cm) 0.066 % Reduction in Area: -178.5% % Reduction in Volume: -725% Epithelialization: None Tunneling: No Undermining: No Wound Description Classification: Full Thickness Without Exposed Support Structures Wound Margin: Distinct, outline attached Exudate Amount: Medium Exudate Type:  Serosanguineous Exudate Color: red, brown Foul Odor After Cleansing: No Slough/Fibrino Yes Wound Bed Granulation Amount: Large (67-100%) Exposed Structure Granulation Quality: Pink, Pale Fascia Exposed: No Necrotic Amount: Small (1-33%) Fat Layer (Subcutaneous Tissue) Exposed: Yes Necrotic Quality: Adherent Slough Tendon Exposed: No Muscle Exposed: No Joint Exposed: No Bone Exposed: No Mcdowell Notes Wound #11 (Toe Great) Wound Laterality: Left Cleanser Wound Cleanser Discharge Instruction: Cleanse the wound with wound cleanser prior to applying a clean dressing using gauze sponges, not tissue or cotton balls. Soap and Water Discharge Instruction: May shower and wash wound with dial antibacterial soap and water prior to dressing change. Peri-Wound Care Topical Primary Dressing KerraCel Ag Gelling Fiber Dressing, 4x5 in (silver alginate) Discharge Instruction: Apply silver alginate to wound bed as instructed Secondary Dressing Woven Gauze Sponges 2x2 in Discharge Instruction: Apply over primary dressing as directed. Secured With Conforming Stretch Gauze Bandage, Sterile 2x75 (in/in) Discharge Instruction: Secure with stretch gauze as directed. Paper Tape, 2x10 (in/yd) Discharge Instruction: Secure dressing with tape as directed. Compression Wrap Compression Stockings Add-Ons Electronic Signature(s) Signed: 07/30/2020 5:21:09 PM By: [name] Signed: 08/01/2020 8:29:36 AM By: Sandre Kitty Previous Signature: 07/30/2020 1:03:49 PM Version By: Onnie Boer  Lenna Sciara BySandre Kitty on 07/30/2020 16:44:25 -------------------------------------------------------------------------------- Wound Assessment Details Patient Name: Date of Service: [name] 07/30/2020 12:45 PM Medical Record Number: [id number] Patient Account Number: [id number] Date of Birth/Sex: Treating RN: [birth year] (80 y.o. [name] Primary Care Tereza Gilham: [name] Other Clinician: Referring Permelia Bamba: Treating Jatavious Peppard/Extender: [name]: 6 Wound Status Wound Number: 12 Primary Lymphedema Etiology: Wound Location: Left, Medial Lower Leg Wound Open Wounding Event: Blister Status: Date Acquired: 07/18/2020 Comorbid Anemia, Chronic Obstructive Pulmonary Disease (COPD), Weeks Of Mcdowell: 1 History: Arrhythmia, Congestive Heart Failure, Coronary Artery Disease, Clustered Wound: No Hypertension, Cirrhosis , Osteoarthritis Photos Wound Measurements Length: (cm) 13.8 Width: (cm) 16 Depth: (cm) 0.1 Area: (cm) 173.416 Volume: (cm) 17.342 % Reduction in Area: -530.9% % Reduction in Volume: -530.8% Epithelialization: None Tunneling: No Undermining: No Wound Description Classification: Full Thickness Without Exposed Support Structures Wound Margin: Distinct, outline attached Exudate Amount: Medium Exudate Type: Serosanguineous Exudate Color: red, brown Foul Odor After Cleansing: No Slough/Fibrino Yes Wound Bed Granulation Amount: Large (67-100%) Exposed Structure Granulation Quality: Red Fascia Exposed: No Necrotic Amount: Small (1-33%) Fat Layer (Subcutaneous Tissue) Exposed: Yes Necrotic Quality: Adherent Slough Tendon Exposed: No Muscle Exposed: No Joint Exposed: No Bone Exposed: No Mcdowell Notes Wound #12 (Lower Leg) Wound Laterality: Left, Medial Cleanser Wound Cleanser Discharge Instruction: Cleanse the wound with wound cleanser prior to applying a clean dressing using gauze sponges, not tissue or cotton balls. Soap and Water Discharge Instruction: May shower and wash wound with dial antibacterial soap and water prior to dressing change. Peri-Wound Care Topical Primary Dressing KerraCel Ag Gelling Fiber Dressing, 4x5 in (silver alginate) Discharge Instruction: Apply silver alginate to wound bed as instructed Secondary Dressing ABD Pad, 5x9 Discharge Instruction:  Apply over primary dressing as directed. Secured With Compression Wrap ThreePress (3 layer compression wrap) Discharge Instruction: ***In clinic only.***Apply LIGHTY three layer compression as directed. Kerlix Roll 4.5x3.1 (in/yd) Discharge Instruction: ****HOME HEALTH TO APPLY AT HOME.****Apply Kerlix and Coban compression as directed. Coban Self-Adherent Wrap 4x5 (in/yd) Discharge Instruction: ****[place] AT HOME.****Apply over Kerlix as directed. Compression Stockings Add-Ons Electronic Signature(s) Signed: 07/30/2020 5:21:09 PM By: [name] Signed: 08/01/2020 8:29:36 AM By: Sandre Kitty Previous Signature: 07/30/2020 1:04:27 PM Version By: [name] Entered By: Sandre Kitty on 07/30/2020 16:44:47 -------------------------------------------------------------------------------- Wound Assessment Details Patient Name: Date of Service: Brian RNER, [name] 07/30/2020 12:45 PM Medical Record Number: [id number] Patient Account Number: [id number] Date of Birth/Sex: Treating RN: 08/21/1940 (80 y.o. [name] Primary Care Master Touchet: [name] Other Clinician: Referring Langley Ingalls: Treating Marda Breidenbach/Extender: [name]: 6 Wound Status Wound Number: 4 Primary Lymphedema Etiology: Wound Location: Left, Dorsal Foot Wound Healed - Epithelialized Wounding Event: Blister Status: Date Acquired: 11/20/2019 Comorbid Anemia, Chronic Obstructive Pulmonary Disease (COPD), Weeks Of Mcdowell: 6 History: Arrhythmia, Congestive Heart Failure, Coronary Artery Disease, Clustered Wound: No Hypertension, Cirrhosis , Osteoarthritis Wound Measurements Length: (cm) Width: (cm) Depth: (cm) Area: (cm) Volume: (cm) 0 % Reduction in Area: 100% 0 % Reduction in Volume: 100% 0 0 0 Wound Description Classification: Full Thickness Without Exposed Support Structur es Electronic Signature(s) Signed: 07/30/2020  1:04:49 PM By: [name] Entered By: [name] on 07/30/2020 13:04:48 -------------------------------------------------------------------------------- Vitals Details Patient Name: Date of Service: Brian RNER, [name] 07/30/2020 12:45 PM Medical Record Number: [id number] Patient Account Number: [id number] Date of Birth/Sex: Treating RN: 1941/02/16 (80 y.o. [name] Primary Care Junaid Wurzer: [name]  Other Clinician: Referring Chee Kinslow: Treating Advika Mclelland/Extender: [name]: 6 Vital Signs Time Taken: 12:56 Temperature (F): 97.5 Height (in): 71 Pulse (bpm): 79 Weight (lbs): 178 Respiratory Rate (breaths/min): 16 Body Mass Index (BMI): 24.8 Blood Pressure (mmHg): 120/66 Reference Range: 80 - 120 mg / dl Electronic Signature(s) Signed: 07/30/2020 1:01:08 PM By: [name] Entered By: [name] on 07/30/2020 13:01:07

## 2020-08-02 NOTE — Telephone Encounter (Signed)
Ok to order 

## 2020-08-03 ENCOUNTER — Inpatient Hospital Stay: Payer: Medicare Other

## 2020-08-03 ENCOUNTER — Other Ambulatory Visit: Payer: Self-pay

## 2020-08-03 VITALS — BP 150/65 | HR 66 | Temp 97.8°F | Resp 16

## 2020-08-03 DIAGNOSIS — I4891 Unspecified atrial fibrillation: Secondary | ICD-10-CM | POA: Diagnosis not present

## 2020-08-03 DIAGNOSIS — D509 Iron deficiency anemia, unspecified: Secondary | ICD-10-CM

## 2020-08-03 DIAGNOSIS — J449 Chronic obstructive pulmonary disease, unspecified: Secondary | ICD-10-CM | POA: Diagnosis not present

## 2020-08-03 DIAGNOSIS — Z87891 Personal history of nicotine dependence: Secondary | ICD-10-CM | POA: Diagnosis not present

## 2020-08-03 DIAGNOSIS — M7989 Other specified soft tissue disorders: Secondary | ICD-10-CM | POA: Diagnosis not present

## 2020-08-03 DIAGNOSIS — L97222 Non-pressure chronic ulcer of left calf with fat layer exposed: Secondary | ICD-10-CM | POA: Diagnosis not present

## 2020-08-03 DIAGNOSIS — I110 Hypertensive heart disease with heart failure: Secondary | ICD-10-CM | POA: Diagnosis not present

## 2020-08-03 DIAGNOSIS — I714 Abdominal aortic aneurysm, without rupture: Secondary | ICD-10-CM | POA: Diagnosis not present

## 2020-08-03 DIAGNOSIS — I87333 Chronic venous hypertension (idiopathic) with ulcer and inflammation of bilateral lower extremity: Secondary | ICD-10-CM | POA: Diagnosis not present

## 2020-08-03 DIAGNOSIS — E785 Hyperlipidemia, unspecified: Secondary | ICD-10-CM | POA: Diagnosis not present

## 2020-08-03 DIAGNOSIS — I130 Hypertensive heart and chronic kidney disease with heart failure and stage 1 through stage 4 chronic kidney disease, or unspecified chronic kidney disease: Secondary | ICD-10-CM | POA: Diagnosis not present

## 2020-08-03 DIAGNOSIS — N183 Chronic kidney disease, stage 3 unspecified: Secondary | ICD-10-CM | POA: Diagnosis not present

## 2020-08-03 DIAGNOSIS — L97312 Non-pressure chronic ulcer of right ankle with fat layer exposed: Secondary | ICD-10-CM | POA: Diagnosis not present

## 2020-08-03 DIAGNOSIS — I482 Chronic atrial fibrillation, unspecified: Secondary | ICD-10-CM | POA: Diagnosis not present

## 2020-08-03 DIAGNOSIS — K7460 Unspecified cirrhosis of liver: Secondary | ICD-10-CM | POA: Diagnosis not present

## 2020-08-03 DIAGNOSIS — I6529 Occlusion and stenosis of unspecified carotid artery: Secondary | ICD-10-CM | POA: Diagnosis not present

## 2020-08-03 DIAGNOSIS — K921 Melena: Secondary | ICD-10-CM | POA: Diagnosis not present

## 2020-08-03 DIAGNOSIS — I739 Peripheral vascular disease, unspecified: Secondary | ICD-10-CM | POA: Diagnosis not present

## 2020-08-03 DIAGNOSIS — L97522 Non-pressure chronic ulcer of other part of left foot with fat layer exposed: Secondary | ICD-10-CM | POA: Diagnosis not present

## 2020-08-03 DIAGNOSIS — I509 Heart failure, unspecified: Secondary | ICD-10-CM | POA: Diagnosis not present

## 2020-08-03 DIAGNOSIS — I2510 Atherosclerotic heart disease of native coronary artery without angina pectoris: Secondary | ICD-10-CM | POA: Diagnosis not present

## 2020-08-03 DIAGNOSIS — I890 Lymphedema, not elsewhere classified: Secondary | ICD-10-CM | POA: Diagnosis not present

## 2020-08-03 MED ORDER — SODIUM CHLORIDE 0.9 % IV SOLN
200.0000 mg | Freq: Once | INTRAVENOUS | Status: AC
  Administered 2020-08-03: 200 mg via INTRAVENOUS
  Filled 2020-08-03: qty 200

## 2020-08-03 MED ORDER — SODIUM CHLORIDE 0.9 % IV SOLN
Freq: Once | INTRAVENOUS | Status: AC
  Filled 2020-08-03: qty 250

## 2020-08-03 NOTE — Patient Instructions (Signed)

## 2020-08-06 ENCOUNTER — Ambulatory Visit: Payer: Medicare Other | Admitting: Nurse Practitioner

## 2020-08-06 ENCOUNTER — Encounter: Payer: Self-pay | Admitting: Internal Medicine

## 2020-08-06 ENCOUNTER — Telehealth: Payer: Self-pay | Admitting: Internal Medicine

## 2020-08-06 MED ORDER — TRANSFER BENCH MISC: 0 refills | Status: DC

## 2020-08-06 NOTE — Telephone Encounter (Signed)
Patients caregiver called back. She can be reached at [phone]

## 2020-08-06 NOTE — Telephone Encounter (Signed)
Patients caregiver [name] called and said that the patient is in a lot of pain. She said that she thinks that he may be going through withdrawals. She was wanting to take him to the ED but she said that that would upset him and if he knew that Dr. Sharlet Salina recommended that he go then he would more than likely go. She is requesting a call back at [phone]. Please advise

## 2020-08-07 ENCOUNTER — Ambulatory Visit: Payer: Medicare Other | Admitting: Physician Assistant

## 2020-08-07 ENCOUNTER — Encounter (HOSPITAL_BASED_OUTPATIENT_CLINIC_OR_DEPARTMENT_OTHER): Payer: Medicare Other | Admitting: Internal Medicine

## 2020-08-07 ENCOUNTER — Other Ambulatory Visit: Payer: Medicare Other

## 2020-08-07 ENCOUNTER — Encounter: Payer: Self-pay | Admitting: Internal Medicine

## 2020-08-07 DIAGNOSIS — L97522 Non-pressure chronic ulcer of other part of left foot with fat layer exposed: Secondary | ICD-10-CM | POA: Diagnosis not present

## 2020-08-07 DIAGNOSIS — I890 Lymphedema, not elsewhere classified: Secondary | ICD-10-CM | POA: Diagnosis not present

## 2020-08-07 DIAGNOSIS — I509 Heart failure, unspecified: Secondary | ICD-10-CM | POA: Diagnosis not present

## 2020-08-07 DIAGNOSIS — I6529 Occlusion and stenosis of unspecified carotid artery: Secondary | ICD-10-CM | POA: Diagnosis not present

## 2020-08-07 DIAGNOSIS — L97312 Non-pressure chronic ulcer of right ankle with fat layer exposed: Secondary | ICD-10-CM | POA: Diagnosis not present

## 2020-08-07 DIAGNOSIS — L97222 Non-pressure chronic ulcer of left calf with fat layer exposed: Secondary | ICD-10-CM | POA: Diagnosis not present

## 2020-08-07 DIAGNOSIS — I714 Abdominal aortic aneurysm, without rupture: Secondary | ICD-10-CM | POA: Diagnosis not present

## 2020-08-07 DIAGNOSIS — I110 Hypertensive heart disease with heart failure: Secondary | ICD-10-CM | POA: Diagnosis not present

## 2020-08-07 DIAGNOSIS — I739 Peripheral vascular disease, unspecified: Secondary | ICD-10-CM | POA: Diagnosis not present

## 2020-08-07 DIAGNOSIS — J449 Chronic obstructive pulmonary disease, unspecified: Secondary | ICD-10-CM | POA: Diagnosis not present

## 2020-08-07 DIAGNOSIS — K7460 Unspecified cirrhosis of liver: Secondary | ICD-10-CM | POA: Diagnosis not present

## 2020-08-07 DIAGNOSIS — I482 Chronic atrial fibrillation, unspecified: Secondary | ICD-10-CM | POA: Diagnosis not present

## 2020-08-07 DIAGNOSIS — E785 Hyperlipidemia, unspecified: Secondary | ICD-10-CM | POA: Diagnosis not present

## 2020-08-07 DIAGNOSIS — I87333 Chronic venous hypertension (idiopathic) with ulcer and inflammation of bilateral lower extremity: Secondary | ICD-10-CM | POA: Diagnosis not present

## 2020-08-08 NOTE — Telephone Encounter (Signed)
Unable to get in contact with the caregiver. LDVM asking her to return my call here at the office. Office number was provided.

## 2020-08-10 ENCOUNTER — Telehealth: Payer: Self-pay | Admitting: Internal Medicine

## 2020-08-10 ENCOUNTER — Inpatient Hospital Stay: Payer: Medicare Other

## 2020-08-10 NOTE — Telephone Encounter (Signed)
   MANJINDER BREAU DOB: 1940/11/29 MRN: [id number]   RIDER WAIVER AND RELEASE OF LIABILITY  For purposes of improving physical access to our facilities, [facility] is pleased to partner with third parties to provide [facility] patients or other authorized individuals the option of convenient, on-demand ground transportation services (the [place]") through use of the technology service that enables users to request on-demand ground transportation from independent third-party providers.  By opting to use and accept these [employer], I, the undersigned, hereby agree on behalf of myself, and on behalf of any minor child using the [employer] for whom I am the parent or legal guardian, as follows:  1. Government social research officer provided to me are provided by independent third-party transportation providers who are not [employer] or employees and who are unaffiliated with [employer]. 2. [facility] is neither a transportation carrier nor a common or public carrier. 3. [facility] has no control over the quality or safety of the transportation that occurs as a result of the [employer]. 4. [facility] cannot guarantee that any third-party transportation provider will complete any arranged transportation service. 5. [facility] makes no representation, warranty, or guarantee regarding the reliability, timeliness, quality, safety, suitability, or availability of any of the Transport Services or that they will be error free. 6. I fully understand that traveling by vehicle involves risks and dangers of serious bodily injury, including permanent disability, paralysis, and death. I agree, on behalf of myself and on behalf of any minor child using the Transport Services for whom I am the parent or legal guardian, that the entire risk arising out of my use of the [employer] remains solely with me, to the maximum extent permitted under applicable law. 7. The [employer] are provided "as is" and "as available." [facility] disclaims all representations and warranties, express, implied or statutory, not expressly set out in these terms, including the implied warranties of merchantability and fitness for a particular purpose. 8. I hereby waive and release [facility], its agents, employees, officers, directors, representatives, insurers, attorneys, assigns, successors, subsidiaries, and affiliates from any and all past, present, or future claims, demands, liabilities, actions, causes of action, or suits of any kind directly or indirectly arising from acceptance and use of the [employer]. 9. I further waive and release [facility] and its affiliates from all present and future liability and responsibility for any injury or death to persons or damages to property caused by or related to the use of the [employer]. 10. I have read this Waiver and Release of Liability, and I understand the terms used in it and their legal significance. This Waiver is freely and voluntarily given with the understanding that my right (as well as the right of any minor child for whom I am the parent or legal guardian using the [employer]) to legal recourse against [facility] in connection with the [employer] is knowingly surrendered in return for use of these services.   I attest that I read the consent document to [name], gave Mr. Gause the opportunity to ask questions and answered the questions asked (if any). I affirm that [name] then provided consent for he's participation in this program.     Katy Apo

## 2020-08-13 ENCOUNTER — Encounter: Payer: Self-pay | Admitting: Surgery

## 2020-08-13 ENCOUNTER — Ambulatory Visit (INDEPENDENT_AMBULATORY_CARE_PROVIDER_SITE_OTHER): Payer: Medicare Other | Admitting: Surgery

## 2020-08-13 ENCOUNTER — Other Ambulatory Visit: Payer: Self-pay

## 2020-08-13 VITALS — BP 128/84 | HR 50 | Temp 98.1°F | Resp 20 | Ht 70.0 in | Wt 180.0 lb

## 2020-08-13 DIAGNOSIS — I7025 Atherosclerosis of native arteries of other extremities with ulceration: Secondary | ICD-10-CM | POA: Diagnosis not present

## 2020-08-13 NOTE — Progress Notes (Signed)
[facility]  Patient name: [name] MRN: [id number] DOB: [birth year] Sex: male   REASON FOR VISIT:    Follow up  HISOTRY OF PRESENT ILLNESS:    [name] is a 80 y.o. male that I initially saw in May 2021 for left leg swelling and ulceration that have been going on for 6 to 8 months.  He was placed in compression.  He was last seen on 07/18/2020.  He has been undergoing wound care at [facility] long for right leg ulcers.  He was complaining of burning in his toes as he had recently had a cyst removed from his left toe.  He is still complaining of burning in his left foot.   The patient has a history of CABG.  He takes Eliquis for atrial fibrillation.  He is medically managed for hypertension.  He takes a statin for hypercholesterolemia.  He has known arterial insufficiency.  ABIs are roughly 0.7 bilaterally.  He did undergo venous reflux studies.  He has a history of cerebral palsy and is wheelchair-bound.   PAST MEDICAL HISTORY:   Past Medical History:  Diagnosis Date  . AAA (abdominal aortic aneurysm) ([place])   . Blindness of left eye   . 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 CAD (coronary artery disease)    a. s/p CABG in 10/2008 with LIMA-LAD, SVG-OM1 with Y-graft to PL branch, and SVG-RCA  . Carotid stenosis   . Cerebrovascular disease   . CHF (congestive heart failure) ([place])   . Cirrhosis ([place])    on CT a/p 07/2013, no longer drinking  . CONGENITAL UNSPEC REDUCTION DEFORMITY LOWER LIMB   . Constipation due to opioid therapy 2014   began about a year ago  . COPD (chronic obstructive pulmonary disease) ([place])   . HTN (hypertension)   . Hyperlipidemia   . 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 Peripheral vascular disease ([place])   . TOBACCO USE, QUIT      FAMILY HISTORY:   Family History  Problem Relation Age of Onset  . Cirrhosis Mother         died at 20  . Heart attack Father   . Other Brother        GSW  . Other Brother        died in house fire  . Cancer Brother        unsure of type Believes colon or prostate/fim  . Colon cancer Neg Hx   . Stomach cancer Neg Hx     SOCIAL HISTORY:   Social History   Tobacco Use  . Smoking status: Former Research scientist (life sciences)  . Smokeless tobacco: Never Used  Substance Use Topics  . Alcohol use: No    Alcohol/week: 0.0 standard drinks    Comment: History of heavy alcohol use per pt. Quit many years ago1/1/ 2005     ALLERGIES:   Allergies  Allergen Reactions  . Doxycycline   . Gabapentin Other (See Comments)    Pt states make him go down to the floor and can not get up  . Amlodipine Nausea And Vomiting and Other (See Comments)    dizziness  . Fish Allergy Nausea And Vomiting    STATES HE HAS NOT EATEN ANY FISH INCLUDING SHELLFISH FOR PAST 40 YRS - IT CAUSED NAUSEA  . Hydrocodone Itching and Rash  . Other Nausea And Vomiting    STATES HE HAS NOT EATEN ANY FISH  INCLUDING SHELLFISH FOR PAST 40 YRS - IT CAUSED NAUSEA  . Tylenol [Acetaminophen] Other (See Comments)    Liver problems     CURRENT MEDICATIONS:   Current Outpatient Medications  Medication Sig Dispense Refill  . albuterol (VENTOLIN HFA) 108 (90 Base) MCG/ACT inhaler TAKE 2 PUFFS BY MOUTH EVERY 6 HOURS AS NEEDED FOR WHEEZE OR SHORTNESS OF BREATH 18 each 2  . b complex vitamins tablet Take 1 tablet by mouth daily.    . buprenorphine (BUTRANS) 7.5 MCG/HR Place 1 patch onto the skin once a week.    . Cholecalciferol (VITAMIN D3 PO) Take by mouth.    . DULoxetine (CYMBALTA) 30 MG capsule TAKE 1 CAPSULE BY MOUTH EVERY DAY 90 capsule 2  . ELIQUIS 5 MG TABS tablet TAKE 1 TABLET BY MOUTH TWICE A DAY 180 tablet 3  . ferrous sulfate 325 (65 FE) MG tablet Take 325 mg by mouth daily with breakfast.    . finasteride (PROSCAR) 5 MG tablet TAKE 1 TABLET BY MOUTH EVERY DAY 90 tablet 1  . fluticasone (FLONASE) 50 MCG/ACT nasal spray  PLACE 1 SPRAY INTO BOTH NOSTRILS EVERY MORNING. 48 mL 1  . furosemide (LASIX) 80 MG tablet TAKE 1 TABLET BY MOUTH EVERY DAY 90 tablet 1  . Guaifenesin 1200 MG TB12 Take 1 tablet (1,200 mg total) by mouth 2 (two) times daily. (Patient taking differently: Take 1,200 mg by mouth 2 (two) times daily as needed (mucus).) 180 each 3  . KLOR-CON M20 20 MEQ tablet TAKE 1 TABLET BY MOUTH EVERY DAY 90 tablet 1  . lactulose (CHRONULAC) 10 GM/15ML solution TAKE 30 MLS BY MOUTH 2 TIMES DAILY AS NEEDED FOR MILD CONSTIPATION OR MODERATE CONSTIPATION (Patient taking differently: Take 20 g by mouth 2 (two) times daily as needed for mild constipation.) 1892 mL 6  . lidocaine (XYLOCAINE) 5 % ointment Apply 1 application topically as needed. 240 g 2  . Misc. Devices (TRANSFER BENCH) MISC Use for transfers prn 1 each 0  . Multiple Vitamins-Minerals (ZINC PO) Take by mouth.    . Naloxone HCl (NARCAN NA) Place 1 application into the nose once.    . nystatin ointment (MYCOSTATIN) APPLY TO AFFECTED AREA TWICE A DAY 30 g 3  . oxyCODONE (ROXICODONE) 15 MG immediate release tablet Take 1 tablet (15 mg total) by mouth every 4 (four) hours as needed for pain. 150 tablet 0  . pantoprazole (PROTONIX) 20 MG tablet Take 1 tablet (20 mg total) by mouth 2 (two) times daily. 180 tablet 0  . predniSONE (DELTASONE) 10 MG tablet TAKE 2 TABLETS (20 MG TOTAL) BY MOUTH DAILY WITH BREAKFAST. 60 tablet 2  . spironolactone (ALDACTONE) 25 MG tablet TAKE 1 TABLET BY MOUTH EVERY DAY 90 tablet 1  . sulfamethoxazole-trimethoprim (BACTRIM DS) 800-160 MG tablet Take 1 tablet by mouth 2 (two) times daily. 14 tablet 0  . SYMBICORT 160-4.5 MCG/ACT inhaler TAKE 2 PUFFS BY MOUTH TWICE A DAY 10.2 each 3  . tamsulosin (FLOMAX) 0.4 MG CAPS capsule TAKE 1 CAPSULE BY MOUTH EVERY DAY 90 capsule 1  . traZODone (DESYREL) 100 MG tablet TAKE 5 TABLETS BY MOUTH AT BEDTIME    . rosuvastatin (CRESTOR) 40 MG tablet Take 1 tablet (40 mg total) by mouth daily. 90 tablet 3    No current facility-administered medications for this visit.    REVIEW OF SYSTEMS:   [X]  denotes positive finding, [ ]  denotes negative finding Cardiac  Comments:  Chest pain or chest pressure:  Shortness of breath upon exertion:    Short of breath when lying flat:    Irregular heart rhythm:        Vascular    Pain in calf, thigh, or hip brought on by ambulation:    Pain in feet at night that wakes you up from your sleep:     Blood clot in your veins:    Leg swelling:         Pulmonary    Oxygen at home:    Productive cough:     Wheezing:         Neurologic    Sudden weakness in arms or legs:     Sudden numbness in arms or legs:     Sudden onset of difficulty speaking or slurred speech:    Temporary loss of vision in one eye:     Problems with dizziness:         Gastrointestinal    Blood in stool:     Vomited blood:         Genitourinary    Burning when urinating:     Blood in urine:        Psychiatric    Major depression:         Hematologic    Bleeding problems:    Problems with blood clotting too easily:        Skin    Rashes or ulcers:        Constitutional    Fever or chills:      PHYSICAL EXAM:   Vitals:   08/13/20 1135  BP: 128/84  Pulse: (!) 50  Resp: 20  Temp: 98.1 F (36.7 C)  SpO2: 99%  Weight: 180 lb (81.6 kg)  Height: 5\' 10"  (1.778 m)    GENERAL: The patient is a well-nourished male, in no acute distress. The vital signs are documented above. CARDIAC: There is a regular rate and rhythm.  VASCULAR: Nonpalpable pedal pulses.  Bilateral edema PULMONARY: Non-labored respirations MUSCULOSKELETAL: There are no major deformities or cyanosis. NEUROLOGIC: No focal weakness or paresthesias are detected. SKIN: No active ulcers PSYCHIATRIC: The patient has a normal affect.  STUDIES:   I have reviewed his most recent ABIs which were extremely elevated given the noncompressibility.  The toe pressure on the left was 0 and was in the 70s  on the right  MEDICAL ISSUES:   Fortunately, despite marginal blood flow to the left leg, all of his bilateral wounds have healed.  I do not think at this time he needs to have angiography performed.  I did discuss that if he develops a change in his clinical presentation that angiography will likely be necessary, however without open wounds, it is not needed.  He will continue with his wound care follow-up and compression therapy to minimize the edema to his legs.  I do think he would benefit from physical therapy to see if we can get him up walking again.  He tells me that this may be possible through his existing home health agency whom Dr. Asa Saunas has been arranging.  I have him scheduled to see [place] back in 6 months with repeat ABIs.  He will contact me sooner if he develops a change in symptoms.    Leia Alf, MD, FACS [facility] [phone] Pager [phone]

## 2020-08-14 ENCOUNTER — Encounter (HOSPITAL_BASED_OUTPATIENT_CLINIC_OR_DEPARTMENT_OTHER): Payer: Medicare Other | Admitting: Internal Medicine

## 2020-08-14 DIAGNOSIS — I110 Hypertensive heart disease with heart failure: Secondary | ICD-10-CM | POA: Diagnosis not present

## 2020-08-14 DIAGNOSIS — I509 Heart failure, unspecified: Secondary | ICD-10-CM | POA: Diagnosis not present

## 2020-08-14 DIAGNOSIS — L97228 Non-pressure chronic ulcer of left calf with other specified severity: Secondary | ICD-10-CM

## 2020-08-14 DIAGNOSIS — L97818 Non-pressure chronic ulcer of other part of right lower leg with other specified severity: Secondary | ICD-10-CM

## 2020-08-14 DIAGNOSIS — E1151 Type 2 diabetes mellitus with diabetic peripheral angiopathy without gangrene: Secondary | ICD-10-CM | POA: Diagnosis not present

## 2020-08-14 DIAGNOSIS — I87333 Chronic venous hypertension (idiopathic) with ulcer and inflammation of bilateral lower extremity: Secondary | ICD-10-CM

## 2020-08-14 DIAGNOSIS — Z87891 Personal history of nicotine dependence: Secondary | ICD-10-CM | POA: Diagnosis not present

## 2020-08-14 DIAGNOSIS — I482 Chronic atrial fibrillation, unspecified: Secondary | ICD-10-CM | POA: Diagnosis not present

## 2020-08-14 DIAGNOSIS — I890 Lymphedema, not elsewhere classified: Secondary | ICD-10-CM | POA: Diagnosis not present

## 2020-08-14 DIAGNOSIS — G809 Cerebral palsy, unspecified: Secondary | ICD-10-CM | POA: Diagnosis not present

## 2020-08-14 NOTE — Progress Notes (Signed)
[name], ARLESTER Mcdowell ([id number]) Visit Report for 08/14/2020 Chief Complaint Document Details Patient Name: Date of Service: [name] 08/14/2020 10:45 A M Medical Record Number: [id number] Patient Account Number: [id number] Date of Birth/Sex: Treating RN: 08/28/1940 (80 y.o. [name] Primary Care Provider: [name] Other Clinician: Referring Provider: Treating Provider/Extender: Verlene Mayer in Treatment: 9 Information Obtained from: Patient Chief Complaint 06/12/2020; patient is here for review of wounds on his bilateral lower legs and feet Electronic Signature(s) Signed: 08/14/2020 11:44:38 AM By: Kalman Shan DO Entered By: Kalman Shan on 08/14/2020 11:34:33 -------------------------------------------------------------------------------- HPI Details Patient Name: Date of Service: [name], [name] 08/14/2020 10:45 A M Medical Record Number: [id number] Patient Account Number: [id number] Date of Birth/Sex: Treating RN: 27-Apr-1940 (80 y.o. [name] Primary Care Provider: [name] Other Clinician: Referring Provider: Treating Provider/Extender: Verlene Mayer in Treatment: 9 History of Present Illness HPI Description: ADMISSION 06/12/2020 This is a 80 year old man who has had wounds on his bilateral lower legs and feet for several months now. Looking in epic he saw his primary physician and early November at which time he had an ulcer of the right calf. He was seen again by his primary doctor on 05/12/2020 at which time he had bilateral lower extremity ulcers. He was reviewed by Dr. Trula Slade of vein and vascular in the spring 2021. He had arterial studies done on 08/12/2019 showing an ABI on the right and 0.74 on the left and 0.69. TBI of the right of 0.29 on the left 0.35 with monophasic waveforms bilaterally. He also had venous reflux studies on 09/29/2019 that did not  show evidence of DVT or SVT bilaterally. In the right he had superficial vein reflux in the SSV and mid thigh AASV greater saphenous vein had previously been harvested. On the left he did not have any superficial vein reflux but it was noted that he had interstitial fluid without throughout the lower extremity. He also has a history of CABG, congestive heart failure followed by Dr. Stanford Breed and apparently liver cirrhosis. He takes Lasix 80 mg as well as spironolactone. He arrives in clinic today with 9 wounds. Most of these are superficial and look like they started with blisters. He has areas on the left dorsal foot x2, left posterior calf left anterior tibial left posterior thigh x2 the right medial lower extremity and the right medial malleolus. He has pitting edema extending well up into his thighs skin changes look like chronic stasis dermatitis. Dr. Trula Slade saw him in May noted that he had tolerated Unna boots and was feeling better. Also noted the moderate arterial insufficiency and suggested if he developed nonhealing wounds then he may need attempted angiography. Past medical history extensive including multiple left hip surgeries, congestive heart failure, cirrhosis, peripheral arterial disease, chronic atrial fibrillation, recurrent cellulitis of the legs, AAA, carotid stenosis, COPD, hypertension and hyperlipidemia We did not attempt his arterial ABIs in our clinic today because of swelling 3/1; this is a patient I admitted to the clinic last week he has multiple wounds on his bilateral lower extremities. I think he has chronic venous insufficiency and PAD. He tolerated the "light" 3 layer wrap I put him on last week and the edema control is better. 06/26/2020 upon evaluation today patient appears to be doing well with regard to his legs overall. He does have several areas of blistering but to be perfectly honest this seems to be making some progress here. Fortunately  there is no signs of  active infection which is great news. No fevers, chills, nausea, vomiting, or diarrhea. He does have a lot of callus over the great toe that is going require some sharp debridement. I discussed that with him today however I am that I do this extremely likely considering the fact that he does appear to potentially have some peripheral vascular disease specifically of the arterial type. Nonetheless he does have a an appointment on March 30 for formal arterial studies. 3/17; bilateral lower extremity wounds in the setting of chronic venous insufficiency and arterial insufficiency. He has a follow-up with Dr. Oneida Alar later this month and he is going to have follow-up noninvasive studies. His renal studies done in April 2021 were really very poor with ABIs of 0.74 and 0.69. TBI's less than 0.3 bilateral 3/24; patient has follow-up with Dr. Oneida Alar on March 30. In spite of the arterial studies which are really not very good he is tolerated a light 3 layer compression and we have good edema control. As a result of this he has had healing on the area on the left lateral thigh the posterior wounds on his calves bilaterally are healed. He still has an area on the left great toe right medial ankle 3/31; the patient saw vein and vascular. Arterial studies showed an ABI on the right noncompressible with a TBI of 0.56 on the left he was also noncompressible but a great toe pressure of 0. There was some talk about him undergoing angiography early next week but I do not think the patient was ready for that information. I have told him that this attempt is absolutely necessary. He arrives in clinic today having not had compression on his legs since yesterday. Large tight blisters on the left anterior lower leg less left dorsal foot and 2 smaller areas on the right 4/11; this is a patient with combined lymphedema and chronic venous insufficiency on one side of his circulation with severe arterial insufficiency on the  other. He is missed his vein and vascular appointment because of transportation issues yesterday. We have been using 3 layer compression because we can do this to a light degree but home health only uses kerlix Coban. I am concerned about going to any more aggressive compression because of the severe PAD. He is now booked to see vein and vascular on April 25. 4/26; patient is in good spirits today. He said he saw Dr.Brabham yesterday and stated he did not recommend any further intervention. He reports improvement in his lower extremity pain bilaterally. He states that home health is coming out 2 times a week to help with dressing changes. He reports no issues today. Electronic Signature(s) Signed: 08/14/2020 11:44:38 AM By: Kalman Shan DO Entered By: Kalman Shan on 08/14/2020 11:36:22 -------------------------------------------------------------------------------- Physical Exam Details Patient Name: Date of Service: [name], [name] 08/14/2020 10:45 A M Medical Record Number: [id number] Patient Account Number: [id number] Date of Birth/Sex: Treating RN: [birth year] (80 y.o. [name] Primary Care Provider: [name] Other Clinician: Referring Provider: Treating Provider/Extender: Verlene Mayer in Treatment: 9 Constitutional respirations regular, non-labored and within target range for patient.[name] Psychiatric pleasant and cooperative. Notes Patient has 3 wounds. There is an open wound to the right medial malleolus, to the left great toe and to the left anterior shin. These appear well-healing. With no signs of infection. No blisters noted. 2+ pitting edema. Electronic Signature(s) Signed: 08/14/2020 11:44:38 AM By: Kalman Shan DO Entered By: Heber Greenland,  Barbette Mcglaun on 08/14/2020 11:38:31 -------------------------------------------------------------------------------- Physician Orders Details Patient Name: Date of Service: [name] 08/14/2020 10:45 A M Medical Record Number: [id number] Patient Account Number: [id number] Date of Birth/Sex: Treating RN: 1940/05/09 (80 y.o. 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 - ***[place] - extra time 60 minutes*** Return A Bathing/ Shower/ Hygiene May shower with protection but do not get wound dressing(s) wet. - May use cast protectors over wraps. You can find these at [facility] or [facility] Edema Control - Lymphedema / SCD / Other Elevate legs to the level of the heart or above for 30 minutes daily and/or when sitting, a frequency of: Avoid standing for long periods of time. Compression stocking or Garment 20-30 mm/Hg pressure to: - patient and family to purchase compression stockings. Bring in weekly. do not wear until wound center says to do so. Home Health No change in wound care orders this week; continue Home Health for wound care. May utilize formulary equivalent dressing for wound treatment orders unless otherwise specified. - *****[place] WITH DRESSING CHANGES.*** ****PLEASE APPLY CALICUM ALGINATE AND ABD PADS TO ALL OPEN AND BLISTERED AREAS.**** Other Home Health Orders/Instructions: - [facility] Wound Treatment Wound #1 - Malleolus Wound  Laterality: Right, Medial Cleanser: Wound Cleanser (Home Health) 2 x Per Week/15 Days Discharge Instructions: Cleanse the wound with wound cleanser prior to applying a clean dressing using gauze sponges, not tissue or cotton balls. Cleanser: Soap and Water [facility]) 2 x Per Week/15 Days Discharge Instructions: May shower and wash wound with dial antibacterial soap and water prior to dressing change. Peri-Wound Care: Sween Lotion (Moisturizing lotion) (Home Health) 2 x Per Week/15 Days Discharge Instructions: Apply moisturizing lotion as directed Prim Dressing: KerraCel Ag Gelling Fiber Dressing, 4x5 in (silver alginate) (Home Health) 2 x Per Week/15 Days ary Discharge Instructions: Apply silver alginate to wound bed as instructed Secondary Dressing: ABD Pad, 5x9 ([place]) 2 x Per Week/15 Days Discharge Instructions: Apply over primary dressing as directed. Compression Wrap: kerlix/coban comression wrap 2 x Per Week/15 Days Discharge Instructions: wrap from base of toes to tibial tuberosity Wound #11 - T Great oe Wound Laterality: Left Cleanser: Wound Cleanser (Home Health) 2 x Per Week/15 Days Discharge Instructions: Cleanse the wound with wound cleanser prior to applying a clean dressing using gauze sponges, not tissue or cotton balls. Cleanser: Soap and Water [facility]) 2 x Per Week/15 Days Discharge Instructions: May shower and wash wound with dial antibacterial soap and water prior to dressing change. Prim Dressing: KerraCel Ag Gelling Fiber Dressing, 4x5 in (silver alginate) (Home Health) 2 x Per Week/15 Days ary Discharge Instructions: Apply silver alginate to wound bed as instructed Secondary Dressing: Woven Gauze Sponges 2x2 in ([place]) 2 x Per Week/15 Days Discharge Instructions: Apply over primary dressing as directed. Secured With: Child psychotherapist, Sterile 2x75 (in/in) (Home Health) 2 x Per Week/15 Days Discharge Instructions: Secure with stretch  gauze  as directed. Wound #13 - Lower Leg Wound Laterality: Left, Anterior Cleanser: Wound Cleanser (Home Health) 2 x Per Week/15 Days Discharge Instructions: Cleanse the wound with wound cleanser prior to applying a clean dressing using gauze sponges, not tissue or cotton balls. Cleanser: Soap and Water [facility]) 2 x Per Week/15 Days Discharge Instructions: May shower and wash wound with dial antibacterial soap and water prior to dressing change. Peri-Wound Care: Sween Lotion (Moisturizing lotion) (Home Health) 2 x Per Week/15 Days Discharge Instructions: Apply moisturizing lotion as directed Prim Dressing: KerraCel Ag Gelling Fiber Dressing, 2x2 in (silver alginate) 2 x Per Week/15 Days ary Discharge Instructions: Apply silver alginate to wound bed as instructed Secondary Dressing: ABD Pad, 5x9 2 x Per Week/15 Days Discharge Instructions: Apply over primary dressing as directed. Compression Wrap: Kerlix Roll 4.5x3.1 (in/yd) (Home Health) 2 x Per Week/15 Days Discharge Instructions: ****HOME HEALTH TO APPLY AT HOME.****Apply Kerlix and Coban compression as directed. Compression Wrap: Coban Self-Adherent Wrap 4x5 (in/yd) (Home Health) 2 x Per Week/15 Days Discharge Instructions: ****HOME HEALTH TO APPLY AT HOME.****Apply over Kerlix as directed. Electronic Signature(s) Signed: 08/14/2020 11:44:38 AM By: Kalman Shan DO Signed: 08/14/2020 6:28:10 PM By: Baruch Gouty RN, BSN Entered By: Baruch Gouty on 08/14/2020 11:34:54 -------------------------------------------------------------------------------- Problem List Details Patient Name: Date of Service: [name], [name] 08/14/2020 10:45 A M Medical Record Number: [id number] Patient Account Number: [id number] Date of Birth/Sex: Treating RN: [birth year] (80 y.o. 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 06/12/2020 No Yes L97.818 Non-pressure chronic ulcer of other part of right lower leg with other specified 06/12/2020 No Yes severity E11.9 Type 2 diabetes mellitus without complications 0000000 No Yes Inactive Problems ICD-10 Code Description Active Date Inactive Date L97.528 Non-pressure chronic ulcer of other part of left foot with other specified severity 06/12/2020 06/12/2020 L97.128 Non-pressure chronic ulcer of left thigh with other specified severity 06/12/2020 06/12/2020 Resolved Problems Electronic Signature(s) Signed: 08/14/2020 11:44:38 AM By: Kalman Shan DO Entered By: Kalman Shan on 08/14/2020 11:31:41 -------------------------------------------------------------------------------- Progress Note Details Patient Name: Date of Service: [name], [name] 08/14/2020 10:45 A M Medical Record Number: [id number] Patient Account Number: [id number] Date of Birth/Sex: Treating RN: 05/28/1940 (80 y.o. [name] Primary Care Provider: [name] Other Clinician: Referring Provider: Treating Provider/Extender: Verlene Mayer in Treatment: 9 Subjective Chief Complaint Information obtained from Patient 06/12/2020; patient is here for review of wounds on his bilateral lower legs and feet History of Present Illness (HPI) ADMISSION 06/12/2020 This is a 80 year old man who has had wounds on his bilateral lower legs and feet for several months now. Looking in epic he saw his primary physician and early November at which time he  had an ulcer of the right calf. He was seen again by his primary doctor on 05/12/2020 at which time he had bilateral lower extremity ulcers. He was reviewed by Dr. Trula Slade of vein and vascular in the spring 2021. He had arterial studies done on 08/12/2019 showing an ABI on the right and 0.74 on the left and 0.69. TBI of the right of 0.29 on the left  0.35 with monophasic waveforms bilaterally. He also had venous reflux studies on 09/29/2019 that did not show evidence of DVT or SVT bilaterally. In the right he had superficial vein reflux in the SSV and mid thigh AASV greater saphenous vein had previously been harvested. On the left he did not have any superficial vein reflux but it was noted that he had interstitial fluid without throughout the lower extremity. He also has a history of CABG, congestive heart failure followed by Dr. Stanford Breed and apparently liver cirrhosis. He takes Lasix 80 mg as well as spironolactone. He arrives in clinic today with 9 wounds. Most of these are superficial and look like they started with blisters. He has areas on the left dorsal foot x2, left posterior calf left anterior tibial left posterior thigh x2 the right medial lower extremity and the right medial malleolus. He has pitting edema extending well up into his thighs skin changes look like chronic stasis dermatitis. Dr. Trula Slade saw him in May noted that he had tolerated Unna boots and was feeling better. Also noted the moderate arterial insufficiency and suggested if he developed nonhealing wounds then he may need attempted angiography. Past medical history extensive including multiple left hip surgeries, congestive heart failure, cirrhosis, peripheral arterial disease, chronic atrial fibrillation, recurrent cellulitis of the legs, AAA, carotid stenosis, COPD, hypertension and hyperlipidemia We did not attempt his arterial ABIs in our clinic today because of swelling 3/1; this is a patient I admitted to the clinic last  week he has multiple wounds on his bilateral lower extremities. I think he has chronic venous insufficiency and PAD. He tolerated the "light" 3 layer wrap I put him on last week and the edema control is better. 06/26/2020 upon evaluation today patient appears to be doing well with regard to his legs overall. He does have several areas of blistering but to be perfectly honest this seems to be making some progress here. Fortunately there is no signs of active infection which is great news. No fevers, chills, nausea, vomiting, or diarrhea. He does have a lot of callus over the great toe that is going require some sharp debridement. I discussed that with him today however I am that I do this extremely likely considering the fact that he does appear to potentially have some peripheral vascular disease specifically of the arterial type. Nonetheless he does have a an appointment on March 30 for formal arterial studies. 3/17; bilateral lower extremity wounds in the setting of chronic venous insufficiency and arterial insufficiency. He has a follow-up with Dr. Oneida Alar later this month and he is going to have follow-up noninvasive studies. His renal studies done in April 2021 were really very poor with ABIs of 0.74 and 0.69. TBI's less than 0.3 bilateral 3/24; patient has follow-up with Dr. Oneida Alar on March 30. In spite of the arterial studies which are really not very good he is tolerated a light 3 layer compression and we have good edema control. As a result of this he has had healing on the area on the left lateral thigh the posterior wounds on his calves bilaterally are healed. He still has an area on the left great toe right medial ankle 3/31; the patient saw vein and vascular. Arterial studies showed an ABI on the right noncompressible with a TBI of 0.56 on the left he was also noncompressible but a great toe pressure of 0. There was some talk about him undergoing angiography early next week but I do not think  the patient  was ready for that information. I have told him that this attempt is absolutely necessary. He arrives in clinic today having not had compression on his legs since yesterday. Large tight blisters on the left anterior lower leg less left dorsal foot and 2 smaller areas on the right 4/11; this is a patient with combined lymphedema and chronic venous insufficiency on one side of his circulation with severe arterial insufficiency on the other. He is missed his vein and vascular appointment because of transportation issues yesterday. We have been using 3 layer compression because we can do this to a light degree but home health only uses kerlix Coban. I am concerned about going to any more aggressive compression because of the severe PAD. He is now booked to see vein and vascular on April 25. 4/26; patient is in good spirits today. He said he saw Dr.Brabham yesterday and stated he did not recommend any further intervention. He reports improvement in his lower extremity pain bilaterally. He states that home health is coming out 2 times a week to help with dressing changes. He reports no issues today. Patient History Information obtained from Patient. Family History Cancer - Siblings, Heart Disease - Father, Hypertension - Father, No family history of Diabetes, Hereditary Spherocytosis, Kidney Disease, Lung Disease, Seizures, Stroke, Thyroid Problems, Tuberculosis. Social History Former smoker - quit 2011, Marital Status - Widowed, Alcohol Use - Never, Drug Use - No History, Caffeine Use - Daily - soda. 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 - TURP. - left hip athroplasty. - left hip closed reductionx2. - total left hip revision. Medical A Surgical History Notes nd Constitutional Symptoms (General Health) chronic pain Cardiovascular carotid stenosis, hyperlipidemia, AAA Genitourinary CKD stage 3, BPH Neurologic cerebrovascular disease, cerebral palsy Objective Constitutional respirations regular, non-labored and within target range for patient.. Vitals Time Taken: 10:29 AM, Height: 71 in, Weight: 178 lbs, BMI: 24.8, Temperature: 97.5 F, Pulse: 67 bpm, Respiratory Rate: 16 breaths/min, Blood Pressure: 112/68 mmHg. Psychiatric pleasant and cooperative. General Notes: Patient has 3 wounds. There is an open wound to the right medial malleolus, to the left great toe and to the left anterior shin. These appear well- healing. With no signs of infection. No blisters noted. 2+ pitting edema. Integumentary (Hair, Skin) Wound #1 status is Open. Original cause of wound was Gradually Appeared. The date acquired was: 11/20/2019. The wound has been in treatment 9 weeks. The wound is located on the Right,Medial Malleolus. The wound measures 0.6cm length x 1.1cm width x 0.1cm depth; 0.518cm^2 area and 0.052cm^3 volume. There is Fat Layer (Subcutaneous Tissue) exposed. There is no tunneling or undermining noted. There is a medium amount of serous drainage noted. The wound margin is distinct with the outline attached to the wound base. There is medium (34-66%) pink granulation  within the wound bed. There is a medium (34-66%) amount of necrotic tissue within the wound bed including Adherent  Slough. Wound #11 status is Open. Original cause of wound was Gradually Appeared. The date acquired was: 06/26/2020. The wound has been in treatment 7 weeks. The wound is located on the Left T Great. The wound measures 0.4cm length x 0.2cm width x 0.2cm depth; 0.063cm^2 area and 0.013cm^3 volume. There is Fat oe Layer (Subcutaneous Tissue) exposed. There is no tunneling or undermining noted. There is a small amount of serous drainage noted. The wound margin is distinct with the outline attached to the wound base. There is large (67-100%) pink, pale granulation within the wound bed. There is no necrotic tissue within the wound bed. Wound #12 status is Healed - Epithelialized. Original cause of wound was Blister. The date acquired was: 07/18/2020. The wound has been in treatment 3 weeks. The wound is located on the Left,Medial Lower Leg. The wound measures 0cm length x 0cm width x 0cm depth; 0cm^2 area and 0cm^3 volume. There is no tunneling or undermining noted. There is a none present amount of drainage noted. There is no granulation within the wound bed. There is no necrotic tissue within the wound bed. Wound #13 status is Open. Original cause of wound was Blister. The date acquired was: 08/14/2020. The wound is located on the Left,Anterior Lower Leg. The wound measures 1.4cm length x 2cm width x 0.1cm depth; 2.199cm^2 area and 0.22cm^3 volume. There is Fat Layer (Subcutaneous Tissue) exposed. There is no tunneling or undermining noted. There is a medium amount of serous drainage noted. The wound margin is flat and intact. There is large (67-100%) pink, pale granulation within the wound bed. There is a small (1-33%) amount of necrotic tissue within the wound bed including Adherent Slough. 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 He saw Dr. Trula Slade on 4/25 and does not think he needs to have an angiography at this time. We will continue with current regimen of silver alginate and Kerlix Coban to the remaining open wounds. No signs of infection. Plan Follow-up Appointments: Return Appointment in 1 week. - ***Hoyer - extra time 60 minutes*** Bathing/ Shower/ Hygiene: May shower with protection but do not get wound dressing(s) wet. - May use cast protectors over wraps. You can find these at [facility] or [facility] Edema Control - Lymphedema / SCD / Other: Elevate legs to the level of the heart or above for 30 minutes daily and/or when sitting, a frequency of: Avoid standing for long periods of time. Compression stocking or Garment 20-30 mm/Hg pressure to: - patient and family to purchase compression stockings. Bring in weekly. do not wear until wound center says to do so. Home Health: No change in wound care orders this week; continue Home Health for wound care. May utilize formulary equivalent dressing for wound treatment orders unless otherwise specified. - *****[place] LEGS WITH DRESSING CHANGES.*** ****PLEASE APPLY CALICUM ALGINATE AND ABD PADS TO ALL OPEN AND BLISTERED AREAS.**** Other Home Health Orders/Instructions: - [facility] WOUND #1: - Malleolus Wound Laterality: Right, Medial Cleanser: Wound Cleanser (Home Health) 2 x Per Week/15 Days Discharge Instructions: Cleanse the wound with wound cleanser prior to applying a clean dressing using gauze sponges, not tissue or cotton balls.  Cleanser: Soap and Water [facility]) 2 x Per  Week/15 Days Discharge Instructions: May shower and wash wound with dial antibacterial soap and water prior to dressing change. Peri-Wound Care: Sween Lotion (Moisturizing lotion) (Home Health) 2 x Per Week/15 Days Discharge Instructions: Apply moisturizing lotion as directed Prim Dressing: KerraCel Ag Gelling Fiber Dressing, 4x5 in (silver alginate) (Home Health) 2 x Per Week/15 Days ary Discharge Instructions: Apply silver alginate to wound bed as instructed Secondary Dressing: ABD Pad, 5x9 ([place]) 2 x Per Week/15 Days Discharge Instructions: Apply over primary dressing as directed. Com pression Wrap: kerlix/coban comression wrap 2 x Per Week/15 Days Discharge Instructions: wrap from base of toes to tibial tuberosity WOUND #11: - T Great Wound Laterality: Left oe Cleanser: Wound Cleanser (Home Health) 2 x Per Week/15 Days Discharge Instructions: Cleanse the wound with wound cleanser prior to applying a clean dressing using gauze sponges, not tissue or cotton balls. Cleanser: Soap and Water [facility]) 2 x Per Week/15 Days Discharge Instructions: May shower and wash wound with dial antibacterial soap and water prior to dressing change. Prim Dressing: KerraCel Ag Gelling Fiber Dressing, 4x5 in (silver alginate) (Home Health) 2 x Per Week/15 Days ary Discharge Instructions: Apply silver alginate to wound bed as instructed Secondary Dressing: Woven Gauze Sponges 2x2 in ([place]) 2 x Per Week/15 Days Discharge Instructions: Apply over primary dressing as directed. Secured With: Child psychotherapist, Sterile 2x75 (in/in) (Home Health) 2 x Per Week/15 Days Discharge Instructions: Secure with stretch gauze as directed. WOUND #13: - Lower Leg Wound Laterality: Left, Anterior Cleanser: Wound Cleanser (Home Health) 2 x Per Week/15 Days Discharge Instructions: Cleanse the wound with wound cleanser prior to applying a clean dressing using gauze sponges, not tissue or cotton  balls. Cleanser: Soap and Water [facility]) 2 x Per Week/15 Days Discharge Instructions: May shower and wash wound with dial antibacterial soap and water prior to dressing change. Peri-Wound Care: Sween Lotion (Moisturizing lotion) (Home Health) 2 x Per Week/15 Days Discharge Instructions: Apply moisturizing lotion as directed Prim Dressing: KerraCel Ag Gelling Fiber Dressing, 2x2 in (silver alginate) 2 x Per Week/15 Days ary Discharge Instructions: Apply silver alginate to wound bed as instructed Secondary Dressing: ABD Pad, 5x9 2 x Per Week/15 Days Discharge Instructions: Apply over primary dressing as directed. Com pression Wrap: Kerlix Roll 4.5x3.1 (in/yd) (Home Health) 2 x Per Week/15 Days Discharge Instructions: ****HOME HEALTH TO APPLY AT HOME.****Apply Kerlix and Coban compression as directed. Com pression Wrap: Coban Self-Adherent Wrap 4x5 (in/yd) (Home Health) 2 x Per Week/15 Days Discharge Instructions: ****HOME HEALTH TO APPLY AT HOME.****Apply over Kerlix as directed. 1. Silver alginate under Kerlix/Coban 2. Follow-up next week with Dr. Dellia Nims Electronic Signature(s) Signed: 08/14/2020 11:44:38 AM By: Kalman Shan DO Entered By: Kalman Shan on 08/14/2020 11:42:37 -------------------------------------------------------------------------------- HxROS Details Patient Name: Date of Service: [name], [name] 08/14/2020 10:45 A M Medical Record Number: [id number] Patient Account Number: [id number] Date of Birth/Sex: Treating RN: 06/19/1940 (80 y.o. 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; Congestive Heart Failure; Coronary Artery Disease; Hypertension Past Medical History  Notes: carotid stenosis, hyperlipidemia, AAA Gastrointestinal Medical History: Positive for: Cirrhosis Endocrine Medical History: Negative for: Type I Diabetes; Type II Diabetes Genitourinary Medical History: Negative for: End Stage Renal Disease Past Medical History Notes: CKD stage 3, BPH Integumentary (Skin) Medical History: Negative for: History of Burn Musculoskeletal Medical History: Positive for: Osteoarthritis Negative for: Gout; Osteomyelitis Neurologic Medical History: Past Medical History Notes: cerebrovascular disease, cerebral palsy Oncologic Medical History: Negative for: Received Chemotherapy; Received Radiation Psychiatric Medical History: Negative for: Anorexia/bulimia; Confinement Anxiety Immunizations Pneumococcal Vaccine: Received Pneumococcal Vaccination: Yes Implantable Devices No devices added Hospitalization / Surgery History Type of Hospitalization/Surgery 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 reductionx2 total left hip revision Family and Social History Cancer: Yes - Siblings; Diabetes: No; Heart Disease: Yes - Father; Hereditary Spherocytosis: No; Hypertension: Yes - Father; Kidney Disease: No; Lung Disease: No; Seizures: No; Stroke: No; Thyroid Problems: No; Tuberculosis: No; Former smoker - quit 2011; Marital Status - Widowed; Alcohol Use: Never; Drug Use: No History; Caffeine Use: Daily - soda; Financial Concerns: No; Food, Clothing or Shelter Needs: No; Support System Lacking: No; Transportation Concerns: No Electronic Signature(s) Signed: 08/14/2020 11:44:38 AM By: Kalman Shan DO Signed: 08/14/2020 6:28:10 PM By:  Baruch Gouty RN, BSN Entered By: Kalman Shan on 08/14/2020 11:36:31 -------------------------------------------------------------------------------- SuperBill Details Patient Name: Date of Service: [name], [name] 08/14/2020 Medical Record Number: [id number] Patient Account Number: [id number] Date of Birth/Sex: Treating RN: November 10, 1940 (80 y.o. 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

## 2020-08-14 NOTE — Progress Notes (Signed)
Punches, TAIJON VINK ([id number]) Visit Report for 08/14/2020 Arrival Information Details Patient Name: Date of Service: [name] 08/14/2020 10:45 A M Medical Record Number: [id number] Patient Account Number: [id number] Date of Birth/Sex: Treating RN: Jul 13, 1940 (80 y.o. [name] Primary Care Zareena Willis: [name] Other Clinician: Referring Cecila Satcher: Treating Isaia Hassell/Extender: Verlene Mayer in Treatment: 9 Visit Information History Since Last Visit Added or deleted any medications: No Patient Arrived: Wheel Chair Any new allergies or adverse reactions: No Arrival Time: 10:28 Had a fall or experienced change in No Accompanied By: self activities of daily living that may affect Transfer Assistance: Harrel Lemon Lift risk of falls: Patient Identification Verified: Yes Signs or symptoms of abuse/neglect since last visito No Secondary Verification Process Completed: Yes Hospitalized since last visit: No Patient Requires Transmission-Based Precautions: No Implantable device outside of the clinic excluding No Patient Has Alerts: No cellular tissue based products placed in the center since last visit: Has Dressing in Place as Prescribed: Yes Pain Present Now: Yes Electronic Signature(s) Signed: 08/14/2020 2:09:33 PM By: Sandre Kitty Entered By: Sandre Kitty on 08/14/2020 10:29:01 -------------------------------------------------------------------------------- Clinic Level of Care Assessment Details Patient Name: Date of Service: Azalia Bilis RLES E. 08/14/2020 10:45 A M Medical Record Number: [id number] Patient Account Number: [id number] Date of Birth/Sex: Treating RN: 05/22/1940 (80 y.o. [name] Primary Care Son Barkan: [name] Other Clinician: Referring Saralynn Langhorst: Treating Phyllis Abelson/Extender: Verlene Mayer in Treatment: 9 Clinic Level of Care Assessment Items TOOL 4 Quantity  Score []  - 0 Use when only an EandM is performed on FOLLOW-UP visit ASSESSMENTS - Nursing Assessment / Reassessment X- 1 10 Reassessment of Co-morbidities (includes updates in patient status) X- 1 5 Reassessment of Adherence to Treatment Plan ASSESSMENTS - Wound and Skin A ssessment / Reassessment []  - 0 Simple Wound Assessment / Reassessment - one wound X- 4 5 Complex Wound Assessment / Reassessment - multiple wounds []  - 0 Dermatologic / Skin Assessment (not related to wound area) ASSESSMENTS - Focused Assessment X- 2 5 Circumferential Edema Measurements - multi extremities []  - 0 Nutritional Assessment / Counseling / Intervention X- 1 5 Lower Extremity Assessment (monofilament, tuning fork, pulses) []  - 0 Peripheral Arterial Disease Assessment (using hand held doppler) ASSESSMENTS - Ostomy and/or Continence Assessment and Care []  - 0 Incontinence Assessment and Management []  - 0 Ostomy Care Assessment and Management (repouching, etc.) PROCESS - Coordination of Care X - Simple Patient / Family Education for ongoing care 1 15 []  - 0 Complex (extensive) Patient / Family Education for ongoing care X- 1 10 Staff obtains Consents, Records, T Results / Process Orders est X- 1 10 Staff telephones HHA, Nursing Homes / Clarify orders / etc []  - 0 Routine Transfer to another Facility (non-emergent condition) []  - 0 Routine Hospital Admission (non-emergent condition) []  - 0 New Admissions / Biomedical engineer / Ordering NPWT Apligraf, etc. , []  - 0 Emergency Hospital Admission (emergent condition) X- 1 10 Simple Discharge Coordination []  - 0 Complex (extensive) Discharge Coordination PROCESS - Special Needs []  - 0 Pediatric / Minor Patient Management []  - 0 Isolation Patient Management []  - 0 Hearing / Language / Visual special needs []  - 0 Assessment of Community assistance (transportation, D/C planning, etc.) []  - 0 Additional assistance / Altered  mentation []  - 0 Support Surface(s) Assessment (bed, cushion, seat, etc.) INTERVENTIONS - Wound Cleansing / Measurement []  - 0 Simple Wound Cleansing - one wound X- 4 5 Complex Wound Cleansing -  multiple wounds X- 1 5 Wound Imaging (photographs - any number of wounds) []  - 0 Wound Tracing (instead of photographs) []  - 0 Simple Wound Measurement - one wound X- 4 5 Complex Wound Measurement - multiple wounds INTERVENTIONS - Wound Dressings []  - 0 Small Wound Dressing one or multiple wounds X- 2 15 Medium Wound Dressing one or multiple wounds []  - 0 Large Wound Dressing one or multiple wounds X- 1 5 Application of Medications - topical []  - 0 Application of Medications - injection INTERVENTIONS - Miscellaneous []  - 0 External ear exam []  - 0 Specimen Collection (cultures, biopsies, blood, body fluids, etc.) []  - 0 Specimen(s) / Culture(s) sent or taken to Lab for analysis X- 1 10 Patient Transfer (multiple staff / Civil Service fast streamer / Similar devices) []  - 0 Simple Staple / Suture removal (25 or less) []  - 0 Complex Staple / Suture removal (26 or more) []  - 0 Hypo / Hyperglycemic Management (close monitor of Blood Glucose) []  - 0 Ankle / Brachial Index (ABI) - do not check if billed separately X- 1 5 Vital Signs Has the patient been seen at the hospital within the last three years: Yes Total Score: 190 Level Of Care: New/Established - Level 5 Electronic Signature(s) Signed: 08/14/2020 6:28:10 PM By: Baruch Gouty RN, BSN Entered By: Baruch Gouty on 08/14/2020 11:41:02 -------------------------------------------------------------------------------- Encounter Discharge Information Details Patient Name: Date of Service: [name], [name] 08/14/2020 10:45 A M Medical Record Number: [id number] Patient Account Number: [id number] Date of Birth/Sex: Treating RN: [birth year] (80 y.o. [name] Primary Care Shelbie Franken: [name] Other Clinician: Referring  Sheridan Gettel: Treating Keaunna Skipper/Extender: Verlene Mayer in Treatment: 9 Encounter Discharge Information Items Discharge Condition: Stable Ambulatory Status: Wheelchair Discharge Destination: Home Transportation: Private Auto Schedule Follow-up Appointment: Yes Clinical Summary of Care: Provided on 08/14/2020 Form Type Recipient Paper Patient Patient Electronic Signature(s) Signed: 08/14/2020 11:36:18 AM By: [name] Entered By: [name] on 08/14/2020 11:36:18 -------------------------------------------------------------------------------- Lower Extremity Assessment Details Patient Name: Date of Service: [name], [name] 08/14/2020 10:45 A M Medical Record Number: [id number] Patient Account Number: [id number] Date of Birth/Sex: Treating RN: 1940/06/17 (80 y.o. [name] Primary Care Reiana Poteet: [name] Other Clinician: Referring Brailyn Delman: Treating Foday Cone/Extender: Verlene Mayer in Treatment: 9 Edema Assessment Assessed: [Left: No] [Right: No] Edema: [Left: Yes] [Right: Yes] Calf Left: Right: Point of Measurement: 29 cm From Medial Instep 32.3 cm 31.4 cm Ankle Left: Right: Point of Measurement: 10 cm From Medial Instep 23.4 cm 21.4 cm Vascular Assessment Pulses: Dorsalis Pedis Palpable: [Left:No] [Right:Yes] Electronic Signature(s) Signed: 08/14/2020 6:28:10 PM By: Baruch Gouty RN, BSN Entered By: Baruch Gouty on 08/14/2020 10:56:27 -------------------------------------------------------------------------------- Multi Wound Chart Details Patient Name: Date of Service: [name], [name] 08/14/2020 10:45 A M Medical Record Number: [id number] Patient Account Number: [id number] Date of Birth/Sex: Treating RN: [birth year] (80 y.o. [name] Primary Care Danaiya Steadman: [name] Other Clinician: Referring Anusha Claus: Treating Miki Blank/Extender: Verlene Mayer in Treatment: 9 Vital Signs Height(in): 78 Pulse(bpm): 77 Weight(lbs): 178 Blood Pressure(mmHg): 112/68 Body Mass Index(BMI): 25 Temperature(F): 97.5 Respiratory Rate(breaths/min): 16 Photos: [1:No Photos Right, Medial Malleolus] [11:No Photos Left T Great oe] [12:No Photos Left, Medial Lower Leg] Wound Location: [1:Gradually Appeared] [11:Gradually Appeared] [12:Blister] Wounding Event: [1:Venous Leg Ulcer] [11:Lymphedema] [12:Lymphedema] Primary Etiology: [1:Lymphedema] [11:N/A] [12:N/A] 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, Chronic Obstructive] [11:Anemia, Chronic Obstructive] [12:Anemia, Chronic Obstructive] Comorbid History: [1:Pulmonary Disease (COPD), Arrhythmia, Congestive Heart Failure, Arrhythmia, Congestive  Heart Failure, Arrhythmia, Congestive Heart Failure, Coronary Artery Disease, Hypertension, Cirrhosis , Osteoarthritis Hypertension, Cirrhosis ,  Osteoarthritis Hypertension, Cirrhosis , Osteoarthritis 11/20/2019] [11:Pulmonary Disease (COPD), Coronary Artery Disease, 06/26/2020] [12:Pulmonary Disease (COPD), Coronary Artery Disease, 07/18/2020] Date Acquired: [1:9] [11:7] [12:3] Weeks of Treatment: [1:Open] [11:Open] [12:Healed - Epithelialized] Wound Status: [1:0.6x1.1x0.1] [11:0.4x0.2x0.2] [12:0x0x0] Measurements L x W x D (cm) [1:0.518] [11:0.063] [12:0] A (cm) : rea [1:0.052] [11:0.013] [12:0] Volume (cm) : [1:95.70%] [11:20.30%] [12:100.00%] % Reduction in Area: [1:95.60%] [11:-62.50%] [12:100.00%] % Reduction in Volume: [1:Full Thickness Without Exposed] [11:Full Thickness Without Exposed] [12:Full Thickness Without Exposed] Classification: [1:Support Structures Medium] [11:Support Structures Small] [12:Support Structures None Present] Exudate Amount: [1:Serous] [11:Serous] [12:N/A] Exudate Type: [1:amber] [11:amber] [12:N/A] Exudate Color: [1:Distinct, outline attached] [11:Distinct, outline attached] [12:N/A] Wound Margin: [1:Medium  (34-66%)] [11:Large (67-100%)] [12:None Present (0%)] Granulation Amount: [1:Pink] [11:Pink, Pale] [12:N/A] Granulation Quality: [1:Medium (34-66%)] [11:None Present (0%)] [12:None Present (0%)] Necrotic Amount: [1:Fat Layer (Subcutaneous Tissue): Yes Fat Layer (Subcutaneous Tissue): Yes Fascia: No] Exposed Structures: [1:Fascia: No Tendon: No Muscle: No Joint: No Bone: No Medium (34-66%)] [11:Fascia: No Tendon: No Muscle: No Joint: No Bone: No Small (1-33%)] [12:Fat Layer (Subcutaneous Tissue): No Tendon: No Muscle: No Joint: No Bone: No Large (67-100%)] Wound Number: 13 N/A N/A Photos: No Photos N/A N/A Left, Anterior Lower Leg N/A N/A Wound Location: Blister N/A N/A Wounding Event: Venous Leg Ulcer N/A N/A Primary Etiology: N/A N/A N/A Secondary Etiology: Anemia, Chronic Obstructive N/A N/A Comorbid History: Pulmonary Disease (COPD), Arrhythmia, Congestive Heart Failure, Coronary Artery Disease, Hypertension, Cirrhosis , Osteoarthritis 08/14/2020 N/A N/A Date Acquired: 0 N/A N/A Weeks of Treatment: Open N/A N/A Wound Status: 1.4x2x0.1 N/A N/A Measurements L x W x D (cm) 2.199 N/A N/A A (cm) : rea 0.22 N/A N/A Volume (cm) : 0.00% N/A N/A % Reduction in Area: 0.00% N/A N/A % Reduction in Volume: Full Thickness Without Exposed N/A N/A Classification: Support Structures Medium N/A N/A Exudate Amount: Serous N/A N/A Exudate Type: amber N/A N/A Exudate Color: Flat and Intact N/A N/A Wound Margin: Large (67-100%) N/A N/A Granulation Amount: Pink, Pale N/A N/A Granulation Quality: Small (1-33%) N/A N/A Necrotic Amount: Fat Layer (Subcutaneous Tissue): Yes N/A N/A Exposed Structures: Fascia: No Tendon: No Muscle: No Joint: No Bone: No Small (1-33%) N/A N/A Epithelialization: Treatment Notes Electronic Signature(s) Signed: 08/14/2020 11:44:38 AM By: Kalman Shan DO Signed: 08/14/2020 6:28:10 PM By: Baruch Gouty RN, BSN Entered By: Kalman Shan  on 08/14/2020 11:34:26 -------------------------------------------------------------------------------- Multi-Disciplinary Care Plan Details Patient Name: Date of Service: [name], [name] 08/14/2020 10:45 A M Medical Record Number: [id number] Patient Account Number: [id number] Date of Birth/Sex: Treating RN: 29-Jun-1940 (80 y.o. [name] Primary Care Andron Marrazzo: [name] Other Clinician: Referring Judie Hollick: Treating Elner Seifert/Extender: Verlene Mayer in Treatment: 9 Active Inactive Wound/Skin Impairment Nursing Diagnoses: Impaired tissue integrity Knowledge deficit related to ulceration/compromised skin integrity Goals: Patient will have a decrease in wound volume by X% from date: (specify in notes) Date Initiated: 06/12/2020 Target Resolution Date: 09/14/2020 Goal Status: Active Patient/caregiver will verbalize understanding of skin care regimen Date Initiated: 06/12/2020 Target Resolution Date: 09/14/2020 Goal Status: Active Ulcer/skin breakdown will have a volume reduction of 30% by week 4 Date Initiated: 06/12/2020 Date Inactivated: 07/05/2020 Target Resolution Date: 06/29/2020 Unmet Reason: see wound Goal Status: Unmet measurements. Interventions: Assess patient/caregiver ability to obtain necessary supplies Assess patient/caregiver ability to perform ulcer/skin care regimen upon admission and as needed Assess ulceration(s) every visit Notes: Electronic Signature(s) Signed: 08/14/2020  6:28:10 PM By: Baruch Gouty RN, BSN Entered By: Baruch Gouty on 08/14/2020 11:01:55 -------------------------------------------------------------------------------- Pain Assessment Details Patient Name: Date of Service: [name], [name] 08/14/2020 10:45 A M Medical Record Number: [id number] Patient Account Number: [id number] Date of Birth/Sex: Treating RN: Dec 12, 1940 (80 y.o. [name] Primary Care Kerianne Gurr: [name] Other Clinician: Referring Isaah Furry: Treating Smith Potenza/Extender: Verlene Mayer in Treatment: 9 Active Problems Location of Pain Severity and Description of Pain Patient Has Paino Yes Site Locations Rate the pain. Current Pain Level: 6 Pain Management and Medication Current Pain Management: Electronic Signature(s) Signed: 08/14/2020 2:09:33 PM By: Sandre Kitty Signed: 08/14/2020 6:28:10 PM By: Baruch Gouty RN, BSN Entered By: Sandre Kitty on 08/14/2020 10:29:33 -------------------------------------------------------------------------------- Patient/Caregiver Education Details Patient Name: Date of Service: [name], [name] 4/26/2022andnbsp10:45 A M Medical Record Number: [id number] Patient Account Number: [id number] Date of Birth/Gender: Treating RN: 15-May-1940 (80 y.o. [name] Primary Care Physician: [name] Other Clinician: Referring Physician: Treating Physician/Extender: Verlene Mayer in Treatment: 9 Education Assessment Education Provided To: Patient Education Topics Provided Venous: Methods: Explain/Verbal Responses: Reinforcements needed, State content correctly Wound/Skin Impairment: Methods: Explain/Verbal Responses: Reinforcements needed, State content correctly Electronic Signature(s) Signed: 08/14/2020 6:28:10 PM By: Baruch Gouty RN, BSN Entered By: Baruch Gouty on 08/14/2020 11:02:20 -------------------------------------------------------------------------------- Wound Assessment Details Patient Name: Date of Service: [name], [name] 08/14/2020 10:45 A M Medical Record Number: [id number] Patient Account Number: [id number] Date of Birth/Sex: Treating RN: 04-Jan-1941 (80 y.o. [name] Primary Care Johniece Hornbaker: [name] Other Clinician: Referring Dakarai Mcglocklin: Treating Evadne Ose/Extender: Verlene Mayer in Treatment: 9 Wound Status Wound Number: 1 Primary Venous Leg Ulcer Etiology: Wound Location: Right, Medial Malleolus Secondary Lymphedema Wounding Event: Gradually Appeared Etiology: Date Acquired: 11/20/2019 Wound Open Weeks Of Treatment: 9 Status: Clustered Wound: No Comorbid Anemia, Chronic Obstructive Pulmonary Disease (COPD), History: Arrhythmia, Congestive Heart Failure, Coronary Artery Disease, Hypertension, Cirrhosis , Osteoarthritis Photos Wound Measurements Length: (cm) 0.6 Width: (cm) 1.1 Depth: (cm) 0.1 Area: (cm) 0.518 Volume: (cm) 0.052 % Reduction in Area: 95.7% % Reduction in Volume: 95.6% Epithelialization: Medium (34-66%) Tunneling: No Undermining: No Wound Description Classification: Full Thickness Without Exposed Support Structures Wound Margin: Distinct, outline attached Exudate Amount: Medium Exudate Type: Serous Exudate Color: amber Foul Odor After Cleansing: No Slough/Fibrino Yes Wound Bed Granulation Amount: Medium (34-66%) Exposed Structure Granulation Quality: Pink Fascia Exposed: No Necrotic Amount: Medium (34-66%) Fat Layer (Subcutaneous Tissue) Exposed: Yes Necrotic Quality: Adherent Slough Tendon Exposed: No Muscle Exposed: No Joint Exposed: No Bone Exposed: No Treatment Notes Wound #1 (Malleolus) Wound Laterality: Right, Medial Cleanser Wound Cleanser Discharge Instruction: Cleanse the wound with wound cleanser prior to applying a clean dressing using gauze sponges, not tissue or cotton balls. Soap and Water Discharge Instruction: May shower and wash wound with dial antibacterial soap and water prior to dressing change. Peri-Wound Care Sween Lotion (Moisturizing lotion) Discharge Instruction: Apply moisturizing lotion as directed Topical Primary Dressing KerraCel Ag Gelling Fiber Dressing, 4x5 in (silver alginate) Discharge Instruction: Apply silver alginate to wound bed as instructed Secondary  Dressing ABD Pad, 5x9 Discharge Instruction: Apply over primary dressing as directed. Secured With Compression Wrap kerlix/coban comression wrap Discharge Instruction: wrap from base of toes to tibial tuberosity Compression Stockings Add-Ons Electronic Signature(s) Signed: 08/14/2020 5:14:41 PM By: Sandre Kitty Signed: 08/14/2020 6:28:10 PM By: Baruch Gouty RN, BSN Entered By: Sandre Kitty on 08/14/2020 17:00:28 -------------------------------------------------------------------------------- Wound Assessment Details Patient Name: Date of Service:  [name], [name] 08/14/2020 10:45 A M Medical Record Number: [id number] Patient Account Number: [id number] Date of Birth/Sex: Treating RN: 1940-12-21 (80 y.o. [name] Primary Care Hadden Steig: [name] Other Clinician: Referring Tahirih Lair: Treating Hayes Czaja/Extender: Verlene Mayer in Treatment: 9 Wound Status Wound Number: 11 Primary Lymphedema Etiology: Wound Location: Left T Great oe Wound Open Wounding Event: Gradually Appeared Status: Date Acquired: 06/26/2020 Comorbid Anemia, Chronic Obstructive Pulmonary Disease (COPD), Weeks Of Treatment: 7 History: Arrhythmia, Congestive Heart Failure, Coronary Artery Disease, Clustered Wound: No Hypertension, Cirrhosis , Osteoarthritis Photos Wound Measurements Length: (cm) 0.4 Width: (cm) 0.2 Depth: (cm) 0.2 Area: (cm) 0.063 Volume: (cm) 0.013 % Reduction in Area: 20.3% % Reduction in Volume: -62.5% Epithelialization: Small (1-33%) Tunneling: No Undermining: No Wound Description Classification: Full Thickness Without Exposed Support Structures Wound Margin: Distinct, outline attached Exudate Amount: Small Exudate Type: Serous Exudate Color: amber Foul Odor After Cleansing: No Slough/Fibrino No Wound Bed Granulation Amount: Large (67-100%) Exposed Structure Granulation Quality: Pink, Pale Fascia Exposed:  No Necrotic Amount: None Present (0%) Fat Layer (Subcutaneous Tissue) Exposed: Yes Tendon Exposed: No Muscle Exposed: No Joint Exposed: No Bone Exposed: No Treatment Notes Wound #11 (Toe Great) Wound Laterality: Left Cleanser Wound Cleanser Discharge Instruction: Cleanse the wound with wound cleanser prior to applying a clean dressing using gauze sponges, not tissue or cotton balls. Soap and Water Discharge Instruction: May shower and wash wound with dial antibacterial soap and water prior to dressing change. Peri-Wound Care Topical Primary Dressing KerraCel Ag Gelling Fiber Dressing, 4x5 in (silver alginate) Discharge Instruction: Apply silver alginate to wound bed as instructed Secondary Dressing Woven Gauze Sponges 2x2 in Discharge Instruction: Apply over primary dressing as directed. Secured With Conforming Stretch Gauze Bandage, Sterile 2x75 (in/in) Discharge Instruction: Secure with stretch gauze as directed. Compression Wrap Compression Stockings Add-Ons Electronic Signature(s) Signed: 08/14/2020 5:14:41 PM By: Sandre Kitty Signed: 08/14/2020 6:28:10 PM By: Baruch Gouty RN, BSN Entered By: Sandre Kitty on 08/14/2020 17:00:53 -------------------------------------------------------------------------------- Wound Assessment Details Patient Name: Date of Service: [name], [name] 08/14/2020 10:45 A M Medical Record Number: [id number] Patient Account Number: [id number] Date of Birth/Sex: Treating RN: 09/25/1940 (80 y.o. [name] Primary Care Aberdeen Hafen: [name] Other Clinician: Referring Lucynda Rosano: Treating Ashby Leflore/Extender: Verlene Mayer in Treatment: 9 Wound Status Wound Number: 12 Primary Lymphedema Etiology: Wound Location: Left, Medial Lower Leg Wound Healed - Epithelialized Wounding Event: Blister Status: Date Acquired: 07/18/2020 Comorbid Anemia, Chronic Obstructive Pulmonary Disease  (COPD), Weeks Of Treatment: 3 History: Arrhythmia, Congestive Heart Failure, Coronary Artery Disease, Clustered Wound: No Hypertension, Cirrhosis , Osteoarthritis Wound Measurements Length: (cm) Width: (cm) Depth: (cm) Area: (cm) Volume: (cm) 0 % Reduction in Area: 100% 0 % Reduction in Volume: 100% 0 Epithelialization: Large (67-100%) 0 Tunneling: No 0 Undermining: No Wound Description Classification: Full Thickness Without Exposed Support Structures Exudate Amount: None Present Foul Odor After Cleansing: No Slough/Fibrino No Wound Bed Granulation Amount: None Present (0%) Exposed Structure Necrotic Amount: None Present (0%) Fascia Exposed: No Fat Layer (Subcutaneous Tissue) Exposed: No Tendon Exposed: No Muscle Exposed: No Joint Exposed: No Bone Exposed: No Electronic Signature(s) Signed: 08/14/2020 6:28:10 PM By: Baruch Gouty RN, BSN Entered By: Baruch Gouty on 08/14/2020 10:59:43 -------------------------------------------------------------------------------- Wound Assessment Details Patient Name: Date of Service: [name], [name] 08/14/2020 10:45 A M Medical Record Number: [id number] Patient Account Number: [id number] Date of Birth/Sex: Treating RN: January 11, 1941 (80 y.o. [name] Primary Care Amatullah Christy: [name] Other Clinician:  Referring Yamaris Cummings: Treating Kaelen Caughlin/Extender: Verlene Mayer in Treatment: 9 Wound Status Wound Number: 13 Primary Venous Leg Ulcer Etiology: Wound Location: Left, Anterior Lower Leg Wound Open Wounding Event: Blister Status: Date Acquired: 08/14/2020 Comorbid Anemia, Chronic Obstructive Pulmonary Disease (COPD), Weeks Of Treatment: 0 History: Arrhythmia, Congestive Heart Failure, Coronary Artery Disease, Clustered Wound: No Clustered Wound: No Hypertension, Cirrhosis , Osteoarthritis Photos Wound Measurements Length: (cm) 1.4 Width: (cm) 2 Depth: (cm) 0.1 Area: (cm)  2.199 Volume: (cm) 0.22 % Reduction in Area: 0% % Reduction in Volume: 0% Epithelialization: Small (1-33%) Tunneling: No Undermining: No Wound Description Classification: Full Thickness Without Exposed Support Structures Wound Margin: Flat and Intact Exudate Amount: Medium Exudate Type: Serous Exudate Color: amber Foul Odor After Cleansing: No Slough/Fibrino No Wound Bed Granulation Amount: Large (67-100%) Exposed Structure Granulation Quality: Pink, Pale Fascia Exposed: No Necrotic Amount: Small (1-33%) Fat Layer (Subcutaneous Tissue) Exposed: Yes Necrotic Quality: Adherent Slough Tendon Exposed: No Muscle Exposed: No Joint Exposed: No Bone Exposed: No Treatment Notes Wound #13 (Lower Leg) Wound Laterality: Left, Anterior Cleanser Wound Cleanser Discharge Instruction: Cleanse the wound with wound cleanser prior to applying a clean dressing using gauze sponges, not tissue or cotton balls. Soap and Water Discharge Instruction: May shower and wash wound with dial antibacterial soap and water prior to dressing change. Peri-Wound Care Sween Lotion (Moisturizing lotion) Discharge Instruction: Apply moisturizing lotion as directed Topical Primary Dressing KerraCel Ag Gelling Fiber Dressing, 2x2 in (silver alginate) Discharge Instruction: Apply silver alginate to wound bed as instructed Secondary Dressing ABD Pad, 5x9 Discharge Instruction: Apply over primary dressing as directed. Secured With Compression Wrap Kerlix Roll 4.5x3.1 (in/yd) Discharge Instruction: ****HOME HEALTH TO APPLY AT HOME.****Apply Kerlix and Coban compression as directed. Coban Self-Adherent Wrap 4x5 (in/yd) Discharge Instruction: ****[place] AT HOME.****Apply over Kerlix as directed. Compression Stockings Add-Ons Electronic Signature(s) Signed: 08/14/2020 5:14:41 PM By: Sandre Kitty Signed: 08/14/2020 6:28:10 PM By: Baruch Gouty RN, BSN Entered By: Sandre Kitty on 08/14/2020  17:01:15 -------------------------------------------------------------------------------- Vitals Details Patient Name: Date of Service: [name], [name] 08/14/2020 10:45 A M Medical Record Number: [id number] Patient Account Number: [id number] Date of Birth/Sex: Treating RN: [birth year] (80 y.o. [name] Primary Care Jastin Fore: [name] Other Clinician: Referring Kynan Peasley: Treating Jose Alleyne/Extender: Verlene Mayer in Treatment: 9 Vital Signs Time Taken: 10:29 Temperature (F): 97.5 Height (in): 71 Pulse (bpm): 67 Weight (lbs): 178 Respiratory Rate (breaths/min): 16 Body Mass Index (BMI): 24.8 Blood Pressure (mmHg): 112/68 Reference Range: 80 - 120 mg / dl Electronic Signature(s) Signed: 08/14/2020 2:09:33 PM By: Sandre Kitty Entered By: Sandre Kitty on 08/14/2020 10:29:18

## 2020-08-15 ENCOUNTER — Other Ambulatory Visit: Payer: Self-pay | Admitting: Internal Medicine

## 2020-08-16 ENCOUNTER — Telehealth: Payer: Self-pay | Admitting: Internal Medicine

## 2020-08-16 ENCOUNTER — Encounter (HOSPITAL_BASED_OUTPATIENT_CLINIC_OR_DEPARTMENT_OTHER): Payer: Medicare Other | Admitting: Internal Medicine

## 2020-08-16 NOTE — Telephone Encounter (Signed)
LVM for pt to rtn my call to schedule AWV with [facility]. Please schedule AWV if pt calls the office  

## 2020-08-17 ENCOUNTER — Inpatient Hospital Stay: Payer: Medicare Other

## 2020-08-17 ENCOUNTER — Other Ambulatory Visit: Payer: Self-pay

## 2020-08-17 VITALS — BP 116/58 | HR 52 | Temp 97.4°F | Resp 18

## 2020-08-17 DIAGNOSIS — D509 Iron deficiency anemia, unspecified: Secondary | ICD-10-CM

## 2020-08-17 DIAGNOSIS — I130 Hypertensive heart and chronic kidney disease with heart failure and stage 1 through stage 4 chronic kidney disease, or unspecified chronic kidney disease: Secondary | ICD-10-CM | POA: Diagnosis not present

## 2020-08-17 DIAGNOSIS — I4891 Unspecified atrial fibrillation: Secondary | ICD-10-CM | POA: Diagnosis not present

## 2020-08-17 DIAGNOSIS — M7989 Other specified soft tissue disorders: Secondary | ICD-10-CM | POA: Diagnosis not present

## 2020-08-17 DIAGNOSIS — Z87891 Personal history of nicotine dependence: Secondary | ICD-10-CM | POA: Diagnosis not present

## 2020-08-17 DIAGNOSIS — N183 Chronic kidney disease, stage 3 unspecified: Secondary | ICD-10-CM | POA: Diagnosis not present

## 2020-08-17 DIAGNOSIS — I2510 Atherosclerotic heart disease of native coronary artery without angina pectoris: Secondary | ICD-10-CM | POA: Diagnosis not present

## 2020-08-17 DIAGNOSIS — K921 Melena: Secondary | ICD-10-CM | POA: Diagnosis not present

## 2020-08-17 DIAGNOSIS — J449 Chronic obstructive pulmonary disease, unspecified: Secondary | ICD-10-CM | POA: Diagnosis not present

## 2020-08-17 DIAGNOSIS — I509 Heart failure, unspecified: Secondary | ICD-10-CM | POA: Diagnosis not present

## 2020-08-17 MED ORDER — SODIUM CHLORIDE 0.9 % IV SOLN
Freq: Once | INTRAVENOUS | Status: AC
Start: 2020-08-17 — End: 2020-08-17
  Filled 2020-08-17: qty 250

## 2020-08-17 MED ORDER — SODIUM CHLORIDE 0.9 % IV SOLN
200.0000 mg | Freq: Once | INTRAVENOUS | Status: AC
  Administered 2020-08-17: 200 mg via INTRAVENOUS
  Filled 2020-08-17: qty 200

## 2020-08-17 NOTE — Patient Instructions (Signed)
[facility]  Discharge Instructions: Thank you for choosing [facility] to provide your oncology and hematology care.   If you have a lab appointment with the Cancer Center, please go directly to the Cancer Center and check in at the registration area.   Wear comfortable clothing and clothing appropriate for easy access to any Portacath or PICC line.   We strive to give you quality time with your provider. You may need to reschedule your appointment if you arrive late (15 or more minutes).  Arriving late affects you and other patients whose appointments are after yours.  Also, if you miss three or more appointments without notifying the office, you may be dismissed from the clinic at the provider's discretion.      For prescription refill requests, have your pharmacy contact our office and allow 72 hours for refills to be completed.    Today you received the following chemotherapy and/or immunotherapy agents Venofer      To help prevent nausea and vomiting after your treatment, we encourage you to take your nausea medication as directed.  BELOW ARE SYMPTOMS THAT SHOULD BE REPORTED IMMEDIATELY: *FEVER GREATER THAN 100.4 F (38 C) OR HIGHER *CHILLS OR SWEATING *NAUSEA AND VOMITING THAT IS NOT CONTROLLED WITH YOUR NAUSEA MEDICATION *UNUSUAL SHORTNESS OF BREATH *UNUSUAL BRUISING OR BLEEDING *URINARY PROBLEMS (pain or burning when urinating, or frequent urination) *BOWEL PROBLEMS (unusual diarrhea, constipation, pain near the anus) TENDERNESS IN MOUTH AND THROAT WITH OR WITHOUT PRESENCE OF ULCERS (sore throat, sores in mouth, or a toothache) UNUSUAL RASH, SWELLING OR PAIN  UNUSUAL VAGINAL DISCHARGE OR ITCHING   Items with * indicate a potential emergency and should be followed up as soon as possible or go to the Emergency Department if any problems should occur.  Please show the CHEMOTHERAPY ALERT CARD or IMMUNOTHERAPY ALERT CARD at check-in to the  Emergency Department and triage nurse.  Should you have questions after your visit or need to cancel or reschedule your appointment, please contact [facility]  Dept: [phone]  and follow the prompts.  Office hours are 8:00 a.m. to 4:30 p.m. Monday - Friday. Please note that voicemails left after 4:00 p.m. may not be returned until the following business day.  We are closed weekends and major holidays. You have access to a nurse at all times for urgent questions. Please call the main number to the clinic Dept: [phone] and follow the prompts.   For any non-urgent questions, you may also contact your provider using MyChart. We now offer e-Visits for anyone 18 and older to request care online for non-urgent symptoms. For details visit [email or web address].   Also download the MyChart app! Go to the app store, search "MyChart", open the app, select [facility], and log in with your MyChart username and password.  Due to Covid, a mask is required upon entering the hospital/clinic. If you do not have a mask, one will be given to you upon arrival. For doctor visits, patients may have 1 support person aged 18 or older with them. For treatment visits, patients cannot have anyone with them due to current Covid guidelines and our immunocompromised population.   

## 2020-08-17 NOTE — Progress Notes (Signed)
Pt declined to stay for full 30 observation after iron infusion. VSS thoughout treatment. Pt discharged in stable condition, wheeled to lobby and ride called. All questions answered to pt satisfaction.

## 2020-08-21 ENCOUNTER — Ambulatory Visit (INDEPENDENT_AMBULATORY_CARE_PROVIDER_SITE_OTHER): Payer: Medicare Other | Admitting: Gastroenterology

## 2020-08-21 ENCOUNTER — Other Ambulatory Visit: Payer: Self-pay

## 2020-08-21 ENCOUNTER — Encounter: Payer: Self-pay | Admitting: Gastroenterology

## 2020-08-21 VITALS — BP 108/56 | HR 62

## 2020-08-21 DIAGNOSIS — I739 Peripheral vascular disease, unspecified: Secondary | ICD-10-CM | POA: Diagnosis not present

## 2020-08-21 DIAGNOSIS — I87333 Chronic venous hypertension (idiopathic) with ulcer and inflammation of bilateral lower extremity: Secondary | ICD-10-CM | POA: Diagnosis not present

## 2020-08-21 DIAGNOSIS — E785 Hyperlipidemia, unspecified: Secondary | ICD-10-CM | POA: Diagnosis not present

## 2020-08-21 DIAGNOSIS — D508 Other iron deficiency anemias: Secondary | ICD-10-CM | POA: Diagnosis not present

## 2020-08-21 DIAGNOSIS — J449 Chronic obstructive pulmonary disease, unspecified: Secondary | ICD-10-CM | POA: Diagnosis not present

## 2020-08-21 DIAGNOSIS — K7030 Alcoholic cirrhosis of liver without ascites: Secondary | ICD-10-CM

## 2020-08-21 DIAGNOSIS — I482 Chronic atrial fibrillation, unspecified: Secondary | ICD-10-CM | POA: Diagnosis not present

## 2020-08-21 DIAGNOSIS — I6529 Occlusion and stenosis of unspecified carotid artery: Secondary | ICD-10-CM | POA: Diagnosis not present

## 2020-08-21 DIAGNOSIS — L97522 Non-pressure chronic ulcer of other part of left foot with fat layer exposed: Secondary | ICD-10-CM | POA: Diagnosis not present

## 2020-08-21 DIAGNOSIS — L97222 Non-pressure chronic ulcer of left calf with fat layer exposed: Secondary | ICD-10-CM | POA: Diagnosis not present

## 2020-08-21 DIAGNOSIS — K7460 Unspecified cirrhosis of liver: Secondary | ICD-10-CM | POA: Diagnosis not present

## 2020-08-21 DIAGNOSIS — I714 Abdominal aortic aneurysm, without rupture: Secondary | ICD-10-CM | POA: Diagnosis not present

## 2020-08-21 DIAGNOSIS — I890 Lymphedema, not elsewhere classified: Secondary | ICD-10-CM | POA: Diagnosis not present

## 2020-08-21 DIAGNOSIS — I509 Heart failure, unspecified: Secondary | ICD-10-CM | POA: Diagnosis not present

## 2020-08-21 DIAGNOSIS — L97312 Non-pressure chronic ulcer of right ankle with fat layer exposed: Secondary | ICD-10-CM | POA: Diagnosis not present

## 2020-08-21 DIAGNOSIS — I110 Hypertensive heart disease with heart failure: Secondary | ICD-10-CM | POA: Diagnosis not present

## 2020-08-21 NOTE — Patient Instructions (Signed)
If you are age 78 or older, your body mass index should be between 23-30. Your There is no height or weight on file to calculate BMI. If this is out of the aforementioned range listed, please consider follow up with your Primary Care Provider.  If you are age 42 or younger, your body mass index should be between 19-25. Your There is no height or weight on file to calculate BMI. If this is out of the aformentioned range listed, please consider follow up with your Primary Care Provider.   Please complete the hemoccult cards, mail back to address on the envelope.   It was a pleasure to see you today!  Thank you for trusting me with your gastrointestinal care!     Alonza Bogus, PA- C

## 2020-08-21 NOTE — Progress Notes (Signed)
08/21/2020 [name] [id number] [birth year]   HISTORY OF PRESENT ILLNESS: This is a 80 year old male who is new to our office.  He has been referred here by hematology, Dede Query, PA-C, for evaluation of iron deficiency anemia.  He had labs performed on March 18 and was found to have a hemoglobin of 6.9 g.  This was compared to 12.4 g five months prior.  Serum iron was low at 17 and iron saturation was low at 7%.  He had a Hemoccult that was negative at that time.  He has received 1 unit of packed red blood cells and IV iron infusions.  He tells me that he has not seen any evidence of bleeding with either black or bloody stools.  He tells me that his bowel movements are erratic from taking his pain medications, but he does use stuff to help him move his bowels.  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  He has never had an EGD in the past.  He has multiple medical problems including coronary artery disease, carotid stenosis/cerebrovascular disease, CHF, longstanding history of cirrhosis dating back to at least 2012, COPD, hypertension, hyperlipidemia, peripheral vascular disease, atrial fibrillation/flutter on Eliquis.  He is currently nonambulatory due to weakness and deconditioning.  He has had ongoing issues with cellulitis and weeping wounds on his lower extremities.   Past Medical History:  Diagnosis Date  . AAA (abdominal aortic aneurysm) ([place])   . Blindness of left eye   . 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 CAD (coronary artery disease)    a. s/p CABG in 10/2008 with LIMA-LAD, SVG-OM1 with Y-graft to PL branch, and SVG-RCA  . Carotid stenosis   . Cerebrovascular disease   . CHF (congestive heart failure) ([place])   . Cirrhosis ([place])    on CT a/p 07/2013, no longer drinking  . CONGENITAL UNSPEC REDUCTION DEFORMITY LOWER LIMB   . Constipation due to  opioid therapy 2014   began about a year ago  . COPD (chronic obstructive pulmonary disease) ([place])   . HTN (hypertension)   . Hyperlipidemia   . 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 Peripheral vascular disease ([place])   . TOBACCO USE, QUIT    Past Surgical History:  Procedure Laterality Date  . CORONARY ARTERY BYPASS GRAFT  11/03/2008   Ricard Dillon - x4: left internal mammary artery to the distal left anterior descending, saphemous vein graft to the first circumflex marginal branch with a Y graft sequentiallly to a left posterolateral branch, saphenous vein graft to the distal right coronary artery  . ELECTROPHYSIOLOGIC STUDY N/A 01/18/2015   Procedure: A-Flutter Ablation;  Surgeon: Deboraha Sprang, MD;  Location: [facility];  Service: Cardiovascular;  Laterality: N/A;  . GREEN LIGHT LASER TURP (TRANSURETHRAL RESECTION OF PROSTATE N/A 02/14/2014   Procedure: GREEN LIGHT LASER TURP (TRANSURETHRAL RESECTION OF PROSTATE;  Surgeon: Festus Aloe, MD;  Location: [facility];  Service: Urology;  Laterality: N/A;  . HIP ARTHROPLASTY Left 01/26/2018   Procedure: LEFT HIP POSTERIOR HEMIARTHROPLASTY;  Surgeon: [name], MD;  Location: [facility];  Service: Orthopedics;  Laterality: Left;  . HIP CLOSED REDUCTION Left 09/18/2018   Procedure: CLOSED MANIPULATION HIP;  Surgeon: Justice Britain, MD;  Location: [facility];  Service: Orthopedics;  Laterality: Left;  . HIP CLOSED REDUCTION Left 10/08/2018   Procedure: CLOSED  MANIPULATION HIP;  Surgeon: Rod Can, MD;  Location: [facility];  Service: Orthopedics;  Laterality: Left;  . TOTAL HIP REVISION Left 06/24/2018   Procedure: ACETABULAR HIP REVISION POSTERIOR;  Surgeon: [name], MD;  Location: [facility];  Service: Orthopedics;  Laterality: Left;  . TOTAL HIP REVISION Left 10/21/2018   Procedure: POSTERIOR REVISION HIP WITH POSSIBLE CONSTRAINED LINER;  Surgeon: [name], MD;  Location: [facility];  Service: Orthopedics;  Laterality:  Left;    reports that he has quit smoking. He has never used smokeless tobacco. He reports that he does not drink alcohol and does not use drugs. family history includes Cancer in his brother; Cirrhosis in his mother; Heart attack in his father; Other in his brother and brother. Allergies  Allergen Reactions  . Doxycycline   . Gabapentin Other (See Comments)    Pt states make him go down to the floor and can not get up  . Amlodipine Nausea And Vomiting and Other (See Comments)    dizziness  . Fish Allergy Nausea And Vomiting    STATES HE HAS NOT EATEN ANY FISH INCLUDING SHELLFISH FOR PAST 40 YRS - IT CAUSED NAUSEA  . Hydrocodone Itching and Rash  . Other Nausea And Vomiting    STATES HE HAS NOT EATEN ANY FISH INCLUDING SHELLFISH FOR PAST 40 YRS - IT CAUSED NAUSEA  . Tylenol [Acetaminophen] Other (See Comments)    Liver problems      Outpatient Encounter Medications as of 08/21/2020  Medication Sig  . albuterol (VENTOLIN HFA) 108 (90 Base) MCG/ACT inhaler TAKE 2 PUFFS BY MOUTH EVERY 6 HOURS AS NEEDED FOR WHEEZE OR SHORTNESS OF BREATH  . b complex vitamins tablet Take 1 tablet by mouth daily.  . buprenorphine (BUTRANS) 7.5 MCG/HR Place 1 patch onto the skin once a week.  . Cholecalciferol (VITAMIN D3 PO) Take by mouth.  . DULoxetine (CYMBALTA) 30 MG capsule TAKE 1 CAPSULE BY MOUTH EVERY DAY  . ELIQUIS 5 MG TABS tablet TAKE 1 TABLET BY MOUTH TWICE A DAY  . ferrous sulfate 325 (65 FE) MG tablet Take 325 mg by mouth daily with breakfast.  . finasteride (PROSCAR) 5 MG tablet TAKE 1 TABLET BY MOUTH EVERY DAY  . fluticasone (FLONASE) 50 MCG/ACT nasal spray PLACE 1 SPRAY INTO BOTH NOSTRILS EVERY MORNING.  . furosemide (LASIX) 80 MG tablet TAKE 1 TABLET BY MOUTH EVERY DAY  . Guaifenesin 1200 MG TB12 Take 1 tablet (1,200 mg total) by mouth 2 (two) times daily. (Patient taking differently: Take 1,200 mg by mouth 2 (two) times daily as needed (mucus).)  . KLOR-CON M20 20 MEQ tablet TAKE 1 TABLET  BY MOUTH EVERY DAY  . lactulose (CHRONULAC) 10 GM/15ML solution TAKE 30 MLS BY MOUTH 2 TIMES DAILY AS NEEDED FOR MILD CONSTIPATION OR MODERATE CONSTIPATION (Patient taking differently: Take 20 g by mouth 2 (two) times daily as needed for mild constipation.)  . lidocaine (XYLOCAINE) 5 % ointment Apply 1 application topically as needed.  . Misc. Devices (TRANSFER BENCH) MISC Use for transfers prn  . Multiple Vitamins-Minerals (ZINC PO) Take by mouth.  . Naloxone HCl (NARCAN NA) Place 1 application into the nose once.  . nystatin ointment (MYCOSTATIN) APPLY TO AFFECTED AREA TWICE A DAY  . oxyCODONE (ROXICODONE) 15 MG immediate release tablet Take 1 tablet (15 mg total) by mouth every 4 (four) hours as needed for pain.  . pantoprazole (PROTONIX) 20 MG tablet Take 1 tablet (20 mg total) by mouth 2 (two) times  daily.  . predniSONE (DELTASONE) 10 MG tablet TAKE 2 TABLETS (20 MG TOTAL) BY MOUTH DAILY WITH BREAKFAST.  [name] spironolactone (ALDACTONE) 25 MG tablet TAKE 1 TABLET BY MOUTH EVERY DAY  . sulfamethoxazole-trimethoprim (BACTRIM DS) 800-160 MG tablet Take 1 tablet by mouth 2 (two) times daily.  . SYMBICORT 160-4.5 MCG/ACT inhaler TAKE 2 PUFFS BY MOUTH TWICE A DAY  . tamsulosin (FLOMAX) 0.4 MG CAPS capsule TAKE 1 CAPSULE BY MOUTH EVERY DAY  . traZODone (DESYREL) 100 MG tablet TAKE 5 TABLETS BY MOUTH AT BEDTIME  . rosuvastatin (CRESTOR) 40 MG tablet Take 1 tablet (40 mg total) by mouth daily.   No facility-administered encounter medications on file as of 08/21/2020.     REVIEW OF SYSTEMS  : All other systems reviewed and negative except where noted in the History of Present Illness.   PHYSICAL EXAM: BP (!) 108/56   Pulse 62  General: Well developed white male in no acute distress; in wheelchair, frail and deconditioned Head: Normocephalic and atraumatic Eyes:  Sclerae anicteric, conjunctiva pink. Ears: Normal auditory acuity Lungs: Clear throughout to auscultation; no W/R/R. Heart: Regular  rate and rhythm; no M/R/G. Abdomen: Soft, non-distended.  BS present.  Non-tender. Musculoskeletal: Symmetrical with no gross deformities  Skin: No lesions on visible extremities Extremities:  B/L LEs wrapped with bandages. Neurological: Alert oriented x 4, grossly non-focal Psychological:  Alert and cooperative. Normal mood and affect  ASSESSMENT AND PLAN: *Iron deficiency anemia: Hemoglobin decreased by about 5 g over the course of 6 months.  He denied any sign of overt GI bleeding with dark or bloody stools.  He was Hemoccult negative x1 last month.  He has received 1 unit of packed red blood cells and IV iron infusions.  He is high risk for endoscopic procedures with his advanced age, multiple medical problems, and anticoagulation, which would require discontinuing/holding for endoscopic evaluation.  He is extremely deconditioned at this time and cannot stand/walk on his own.  He has just started working with physical therapy.  We discussed holding off on endoscopic evaluation for now.  Would continue supportive care with close monitoring and transfusions/iron supplementation as needed.  I am going to send him home with 3 more Hemoccult cards to performed on 3 separate stool samples.  Certainly if hemoglobin continues to trend down despite supportive care or if he begins having overt bleeding then would need to reconsider evaluation.  He is in agreement of the plan and states that he has multiple other medical issues going on right now with several appointments and would prefer to hold off on evaluation at this time. *Cirrhosis: Appears that he has had this diagnosis dating back to at least 2012, likely due to remote ETOH use.  Appears to be well compensated with normal LFTs (INR slightly elevated but on Eliquis), normal platelets .  CT scan in January 2021 showed no evidence of liver lesions.  We will need to have an AFP performed at some point during some repeat labs.  CC:  [name],  *

## 2020-08-22 NOTE — Progress Notes (Signed)
I agree with the above note, plan 

## 2020-08-23 ENCOUNTER — Other Ambulatory Visit: Payer: Self-pay

## 2020-08-23 ENCOUNTER — Encounter (HOSPITAL_BASED_OUTPATIENT_CLINIC_OR_DEPARTMENT_OTHER): Payer: Medicare Other | Attending: Internal Medicine | Admitting: Internal Medicine

## 2020-08-23 DIAGNOSIS — J449 Chronic obstructive pulmonary disease, unspecified: Secondary | ICD-10-CM | POA: Insufficient documentation

## 2020-08-23 DIAGNOSIS — I890 Lymphedema, not elsewhere classified: Secondary | ICD-10-CM | POA: Diagnosis not present

## 2020-08-23 DIAGNOSIS — I482 Chronic atrial fibrillation, unspecified: Secondary | ICD-10-CM | POA: Insufficient documentation

## 2020-08-23 DIAGNOSIS — L97529 Non-pressure chronic ulcer of other part of left foot with unspecified severity: Secondary | ICD-10-CM | POA: Diagnosis not present

## 2020-08-23 DIAGNOSIS — E1151 Type 2 diabetes mellitus with diabetic peripheral angiopathy without gangrene: Secondary | ICD-10-CM | POA: Insufficient documentation

## 2020-08-23 DIAGNOSIS — Z951 Presence of aortocoronary bypass graft: Secondary | ICD-10-CM | POA: Insufficient documentation

## 2020-08-23 DIAGNOSIS — L97822 Non-pressure chronic ulcer of other part of left lower leg with fat layer exposed: Secondary | ICD-10-CM | POA: Diagnosis not present

## 2020-08-23 DIAGNOSIS — I110 Hypertensive heart disease with heart failure: Secondary | ICD-10-CM | POA: Insufficient documentation

## 2020-08-23 DIAGNOSIS — K7460 Unspecified cirrhosis of liver: Secondary | ICD-10-CM | POA: Insufficient documentation

## 2020-08-23 DIAGNOSIS — L97222 Non-pressure chronic ulcer of left calf with fat layer exposed: Secondary | ICD-10-CM | POA: Diagnosis not present

## 2020-08-23 DIAGNOSIS — L97312 Non-pressure chronic ulcer of right ankle with fat layer exposed: Secondary | ICD-10-CM | POA: Insufficient documentation

## 2020-08-23 DIAGNOSIS — E11622 Type 2 diabetes mellitus with other skin ulcer: Secondary | ICD-10-CM | POA: Diagnosis not present

## 2020-08-23 DIAGNOSIS — I70203 Unspecified atherosclerosis of native arteries of extremities, bilateral legs: Secondary | ICD-10-CM | POA: Diagnosis not present

## 2020-08-23 DIAGNOSIS — I87333 Chronic venous hypertension (idiopathic) with ulcer and inflammation of bilateral lower extremity: Secondary | ICD-10-CM | POA: Diagnosis not present

## 2020-08-23 DIAGNOSIS — I509 Heart failure, unspecified: Secondary | ICD-10-CM | POA: Insufficient documentation

## 2020-08-23 DIAGNOSIS — J441 Chronic obstructive pulmonary disease with (acute) exacerbation: Secondary | ICD-10-CM | POA: Diagnosis not present

## 2020-08-23 DIAGNOSIS — I5032 Chronic diastolic (congestive) heart failure: Secondary | ICD-10-CM | POA: Diagnosis not present

## 2020-08-23 NOTE — Progress Notes (Signed)
[name], [name] ([id number]) Visit Report for 08/23/2020 HPI Details Patient Name: Date of Service: [name], [name]. 08/23/2020 10:45 A M Medical Record Number: [id number] Patient Account Number: [id number] Date of Birth/Sex: Treating RN: [birth year] (80 y.o. M) Primary Care Provider: [name] Other Clinician: Referring Provider: Treating Provider/Extender: [name] in Treatment: 10 History of Present Illness HPI Description: ADMISSION 06/12/2020 This is a 80 year old man who has had wounds on his bilateral lower legs and feet for several months now. Looking in epic he saw his primary physician and early November at which time he had an ulcer of the right calf. He was seen again by his primary doctor on 05/12/2020 at which time he had bilateral lower extremity ulcers. He was reviewed by Dr. Trula Slade of vein and vascular in the spring 2021. He had arterial studies done on 08/12/2019 showing an ABI on the right and 0.74 on the left and 0.69. TBI of the right of 0.29 on the left 0.35 with monophasic waveforms bilaterally. He also had venous reflux studies on 09/29/2019 that did not show evidence of DVT or SVT bilaterally. In the right he had superficial vein reflux in the SSV and mid thigh AASV greater saphenous vein had previously been harvested. On the left he did not have any superficial vein reflux but it was noted that he had interstitial fluid without throughout the lower extremity. He also has a history of CABG, congestive heart failure followed by Dr. Stanford Breed and apparently liver cirrhosis. He takes Lasix 80 mg as well as spironolactone. He arrives in clinic today with 9 wounds. Most of these are superficial and look like they started with blisters. He has areas on the left dorsal foot x2, left posterior calf left anterior tibial left posterior thigh x2 the right medial lower extremity and the right medial malleolus. He has pitting edema extending  well up into his thighs skin changes look like chronic stasis dermatitis. Dr. Trula Slade saw him in May noted that he had tolerated Unna boots and was feeling better. Also noted the moderate arterial insufficiency and suggested if he developed nonhealing wounds then he may need attempted angiography. Past medical history extensive including multiple left hip surgeries, congestive heart failure, cirrhosis, peripheral arterial disease, chronic atrial fibrillation, recurrent cellulitis of the legs, AAA, carotid stenosis, COPD, hypertension and hyperlipidemia We did not attempt his arterial ABIs in our clinic today because of swelling 3/1; this is a patient I admitted to the clinic last week he has multiple wounds on his bilateral lower extremities. I think he has chronic venous insufficiency and PAD. He tolerated the "light" 3 layer wrap I put him on last week and the edema control is better. 06/26/2020 upon evaluation today patient appears to be doing well with regard to his legs overall. He does have several areas of blistering but to be perfectly honest this seems to be making some progress here. Fortunately there is no signs of active infection which is great news. No fevers, chills, nausea, vomiting, or diarrhea. He does have a lot of callus over the great toe that is going require some sharp debridement. I discussed that with him today however I am that I do this extremely likely considering the fact that he does appear to potentially have some peripheral vascular disease specifically of the arterial type. Nonetheless he does have a an appointment on March 30 for formal arterial studies. 3/17; bilateral lower extremity wounds in the setting of chronic venous insufficiency and  arterial insufficiency. He has a follow-up with Dr. Oneida Alar later this month and he is going to have follow-up noninvasive studies. His renal studies done in April 2021 were really very poor with ABIs of 0.74 and 0.69. TBI's  less than 0.3 bilateral 3/24; patient has follow-up with Dr. Oneida Alar on March 30. In spite of the arterial studies which are really not very good he is tolerated a light 3 layer compression and we have good edema control. As a result of this he has had healing on the area on the left lateral thigh the posterior wounds on his calves bilaterally are healed. He still has an area on the left great toe right medial ankle 3/31; the patient saw vein and vascular. Arterial studies showed an ABI on the right noncompressible with a TBI of 0.56 on the left he was also noncompressible but a great toe pressure of 0. There was some talk about him undergoing angiography early next week but I do not think the patient was ready for that information. I have told him that this attempt is absolutely necessary. He arrives in clinic today having not had compression on his legs since yesterday. Large tight blisters on the left anterior lower leg less left dorsal foot and 2 smaller areas on the right 4/11; this is a patient with combined lymphedema and chronic venous insufficiency on one side of his circulation with severe arterial insufficiency on the other. He is missed his vein and vascular appointment because of transportation issues yesterday. We have been using 3 layer compression because we can do this to a light degree but home health only uses kerlix Coban. I am concerned about going to any more aggressive compression because of the severe PAD. He is now booked to see vein and vascular on April 25. 4/26; patient is in good spirits today. He said he saw Dr.Brabham yesterday and stated he did not recommend any further intervention. He reports improvement in his lower extremity pain bilaterally. He states that home health is coming out 2 times a week to help with dressing changes. He reports no issues today. 5/5; patient saw Dr. Trula Slade who did not feel he needed any further intervention right now. The area on the  left first toe is healed he still has an area on the right medial malleolus. However he comes in with blisters on the anterior leg and posterior leg. The edema control in the left leg is not as good as the right Electronic Signature(s) Signed: 08/23/2020 5:06:47 PM By: Linton Ham MD Entered By: Linton Ham on 08/23/2020 11:18:16 -------------------------------------------------------------------------------- Physical Exam Details Patient Name: Date of Service: [name], [name]. 08/23/2020 10:45 A M Medical Record Number: [id number] Patient Account Number: [id number] Date of Birth/Sex: Treating RN: 1941/01/11 (80 y.o. M) Primary Care Provider: [name] Other Clinician: Referring Provider: Treating Provider/Extender: [name] in Treatment: 10 Constitutional Sitting or standing Blood Pressure is within target range for patient.. Pulse regular and within target range for patient.[name] Respirations regular, non-labored and within target range.. Temperature is normal and within the target range for the patient.[name] Appears in no distress. Notes Wound exam; the left first toe is healed. The area on the right medial malleolus is very superficial and small. However he comes in today with a wide amount of blistering on the left mid tibia area a large area posteriorly. Clearly we do not have adequate edema control here. Electronic Signature(s) Signed: 08/23/2020 5:06:47 PM By: Linton Ham MD Entered  By: Linton Ham on 08/23/2020 11:19:22 -------------------------------------------------------------------------------- Physician Orders Details Patient Name: Date of Service: [name], [name]. 08/23/2020 10:45 A M Medical Record Number: [id number] Patient Account Number: [id number] Date of Birth/Sex: Treating RN: [birth year] (80 y.o. 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 - ***[place] - extra time 60 minutes*** Return A Bathing/ Shower/ Hygiene May shower with protection but do not get wound dressing(s) wet. - May use cast protectors over wraps. You can find these at [facility] or [facility] Edema Control - Lymphedema / SCD / Other Elevate legs to the level of the heart or above for 30 minutes daily and/or when sitting, a frequency of: Avoid standing for long periods of time. Compression stocking or Garment 20-30 mm/Hg pressure to: - patient and family to purchase compression stockings. Bring in weekly. do not wear until wound center says to do so. Home Health New wound care orders this week; continue Home Health for wound care. May utilize formulary equivalent dressing for wound treatment orders unless otherwise specified. - ***Home Health to please have physical therapy evaluate and treat.*** ****[place] TO BOTH LEGS WITH DRESSING CHANGES.*** ****PLEASE APPLY CALICUM ALGINATE AND ABD PADS TO ALL OPEN AND BLISTERED AREAS.**** Other Home Health Orders/Instructions: - [facility] Wound Treatment Wound #1 - Malleolus Wound Laterality: Right, Medial Cleanser: Wound Cleanser (Home Health) 2 x Per Week/15 Days Discharge Instructions: Cleanse the wound with wound cleanser prior to applying a clean dressing using gauze sponges, not  tissue or cotton balls. Cleanser: Soap and Water [facility]) 2 x Per Week/15 Days Discharge Instructions: May shower and wash wound with dial antibacterial soap and water prior to dressing change. Peri-Wound Care: Sween Lotion (Moisturizing lotion) (Home Health) 2 x Per Week/15 Days Discharge Instructions: Apply moisturizing lotion as directed Prim Dressing: KerraCel Ag Gelling Fiber Dressing, 4x5 in (silver alginate) (Home Health) 2 x Per Week/15 Days ary Discharge Instructions: Apply silver alginate to wound bed as instructed Secondary Dressing: ABD Pad, 5x9 ([place]) 2 x Per Week/15 Days Discharge Instructions: Apply over primary dressing as directed. Compression Wrap: kerlix/coban comression wrap 2 x Per Week/15 Days Discharge Instructions: wrap from base of toes to tibial tuberosity Wound #13 - Lower Leg Wound Laterality: Left, Anterior Cleanser: Wound Cleanser (Home Health) 2 x Per Week/15 Days Discharge Instructions: Cleanse the wound with wound cleanser prior to applying a clean dressing using gauze sponges, not tissue or cotton balls. Cleanser: Soap and Water [facility]) 2 x Per Week/15 Days Discharge Instructions: May shower and wash wound with dial antibacterial soap and water prior to dressing change. Peri-Wound Care: Sween Lotion (Moisturizing lotion) (Home Health) 2 x Per Week/15 Days Discharge Instructions: Apply moisturizing lotion as directed Prim Dressing: KerraCel Ag Gelling Fiber Dressing, 4x5 in (silver alginate) (Home Health) 2 x Per Week/15 Days ary Discharge Instructions: Apply silver alginate to wound bed as instructed Secondary Dressing: ABD Pad, 5x9 ([place]) 2 x Per Week/15 Days Discharge Instructions: Apply  over primary dressing as directed. Compression Wrap: kerlix/coban comression wrap 2 x Per Week/15 Days Discharge Instructions: wrap from base of toes to tibial tuberosity Electronic Signature(s) Signed: 08/23/2020 5:06:47 PM By: Linton Ham  MD Signed: 08/23/2020 6:21:36 PM By: [name] Entered By: [name] on 08/23/2020 11:13:56 -------------------------------------------------------------------------------- Problem List Details Patient Name: Date of Service: [name], [name]. 08/23/2020 10:45 A M Medical Record Number: [id number] Patient Account Number: [id number] Date of Birth/Sex: Treating RN: October 15, 1940 (80 y.o. 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 06/12/2020 No Yes L97.818 Non-pressure chronic ulcer of other part of right lower leg with other specified 06/12/2020 No Yes severity E11.9 Type 2 diabetes mellitus without complications 0000000 No Yes Inactive Problems ICD-10 Code Description Active Date Inactive Date L97.528 Non-pressure chronic ulcer of other part of left foot with other specified severity 06/12/2020 06/12/2020 L97.128 Non-pressure chronic ulcer of left thigh with other specified severity 06/12/2020 06/12/2020 Resolved Problems Electronic Signature(s) Signed: 08/23/2020 5:06:47 PM By: Linton Ham MD Entered By: Linton Ham on 08/23/2020 11:16:31 -------------------------------------------------------------------------------- Progress Note Details Patient Name: Date of Service: [name], [name]. 08/23/2020 10:45 A M Medical Record Number: [id number] Patient Account Number: [id number] Date of Birth/Sex: Treating RN: 11/21/1940 (80 y.o. M) Primary Care Provider: [name]  Other Clinician: Referring Provider: Treating Provider/Extender: [name] in Treatment: 10 Subjective History of Present Illness (HPI) ADMISSION 06/12/2020 This is a 80 year old man who has had wounds on his bilateral lower legs and feet for several months now. Looking in epic he saw his primary physician and early November at which time he had an ulcer of the right calf. He was seen again by his primary doctor on 05/12/2020 at which time he had bilateral lower extremity ulcers. He was reviewed by Dr. Trula Slade of vein and vascular in the spring 2021. He had arterial studies done on 08/12/2019 showing an ABI on the right and 0.74 on the left and 0.69. TBI of the right of 0.29 on the left 0.35 with monophasic waveforms bilaterally. He also had venous reflux studies on 09/29/2019 that did not show evidence of DVT or SVT bilaterally. In the right he had superficial vein reflux in the SSV and mid thigh AASV greater saphenous vein had previously been harvested. On the left he did not have any superficial vein reflux but it was noted that he had interstitial fluid without throughout the lower extremity. He also has a history of CABG, congestive heart failure followed by Dr. Stanford Breed and apparently liver cirrhosis. He takes Lasix 80 mg as well as spironolactone. He arrives in clinic today with 9 wounds. Most of these are superficial and look like they started with blisters. He has areas on the left dorsal foot x2, left posterior calf left anterior tibial left posterior thigh x2 the right medial lower extremity and the right medial malleolus. He has pitting edema extending well up into his thighs skin changes look like chronic stasis dermatitis. Dr. Trula Slade saw him in May noted that he had tolerated Unna boots and was feeling better. Also noted the moderate arterial insufficiency and suggested if he developed  nonhealing wounds then he may need attempted angiography. Past  medical history extensive including multiple left hip surgeries, congestive heart failure, cirrhosis, peripheral arterial disease, chronic atrial fibrillation, recurrent cellulitis of the legs, AAA, carotid stenosis, COPD, hypertension and hyperlipidemia We did not attempt his arterial ABIs in our clinic today because of swelling 3/1; this is a patient I admitted to the clinic last week he has multiple wounds on his bilateral lower extremities. I think he has chronic venous insufficiency and PAD. He tolerated the "light" 3 layer wrap I put him on last week and the edema control is better. 06/26/2020 upon evaluation today patient appears to be doing well with regard to his legs overall. He does have several areas of blistering but to be perfectly honest this seems to be making some progress here. Fortunately there is no signs of active infection which is great news. No fevers, chills, nausea, vomiting, or diarrhea. He does have a lot of callus over the great toe that is going require some sharp debridement. I discussed that with him today however I am that I do this extremely likely considering the fact that he does appear to potentially have some peripheral vascular disease specifically of the arterial type. Nonetheless he does have a an appointment on March 30 for formal arterial studies. 3/17; bilateral lower extremity wounds in the setting of chronic venous insufficiency and arterial insufficiency. He has a follow-up with Dr. Oneida Alar later this month and he is going to have follow-up noninvasive studies. His renal studies done in April 2021 were really very poor with ABIs of 0.74 and 0.69. TBI's less than 0.3 bilateral 3/24; patient has follow-up with Dr. Oneida Alar on March 30. In spite of the arterial studies which are really not very good he is tolerated a light 3 layer compression and we have good edema control. As a result of this he has had healing on the area on the left lateral thigh the posterior  wounds on his calves bilaterally are healed. He still has an area on the left great toe right medial ankle 3/31; the patient saw vein and vascular. Arterial studies showed an ABI on the right noncompressible with a TBI of 0.56 on the left he was also noncompressible but a great toe pressure of 0. There was some talk about him undergoing angiography early next week but I do not think the patient was ready for that information. I have told him that this attempt is absolutely necessary. He arrives in clinic today having not had compression on his legs since yesterday. Large tight blisters on the left anterior lower leg less left dorsal foot and 2 smaller areas on the right 4/11; this is a patient with combined lymphedema and chronic venous insufficiency on one side of his circulation with severe arterial insufficiency on the other. He is missed his vein and vascular appointment because of transportation issues yesterday. We have been using 3 layer compression because we can do this to a light degree but home health only uses kerlix Coban. I am concerned about going to any more aggressive compression because of the severe PAD. He is now booked to see vein and vascular on April 25. 4/26; patient is in good spirits today. He said he saw Dr.Brabham yesterday and stated he did not recommend any further intervention. He reports improvement in his lower extremity pain bilaterally. He states that home health is coming out 2 times a week to help with dressing changes. He reports no issues today. 5/5;  patient saw Dr. Trula Slade who did not feel he needed any further intervention right now. The area on the left first toe is healed he still has an area on the right medial malleolus. However he comes in with blisters on the anterior leg and posterior leg. The edema control in the left leg is not as good as the right Objective Constitutional Sitting or standing Blood Pressure is within target range for patient.. Pulse  regular and within target range for patient.[name] Respirations regular, non-labored and within target range.. Temperature is normal and within the target range for the patient.[name] Appears in no distress. Vitals Time Taken: 10:15 AM, Height: 71 in, Weight: 178 lbs, BMI: 24.8, Temperature: 97.7 F, Pulse: 60 bpm, Respiratory Rate: 18 breaths/min, Blood Pressure: 112/58 mmHg. General Notes: Wound exam; the left first toe is healed. The area on the right medial malleolus is very superficial and small. However he comes in today with a wide amount of blistering on the left mid tibia area a large area posteriorly. Clearly we do not have adequate edema control here. Integumentary (Hair, Skin) Wound #1 status is Open. Original cause of wound was Gradually Appeared. The date acquired was: 11/20/2019. The wound has been in treatment 10 weeks. The wound is located on the Right,Medial Malleolus. The wound measures 0.3cm length x 0.4cm width x 0.1cm depth; 0.094cm^2 area and 0.009cm^3 volume. There is Fat Layer (Subcutaneous Tissue) exposed. There is no tunneling or undermining noted. There is a medium amount of serosanguineous drainage noted. The wound margin is distinct with the outline attached to the wound base. There is large (67-100%) pink granulation within the wound bed. There is a small (1-33%) amount of necrotic tissue within the wound bed including Adherent Slough. Wound #11 status is Healed - Epithelialized. Original cause of wound was Gradually Appeared. The date acquired was: 06/26/2020. The wound has been in treatment 8 weeks. The wound is located on the Left T Great. The wound measures 0cm length x 0cm width x 0cm depth; 0cm^2 area and 0cm^3 volume. oe Wound #13 status is Open. Original cause of wound was Blister. The date acquired was: 08/14/2020. The wound has been in treatment 1 weeks. The wound is located on the Left,Anterior Lower Leg. The wound measures 0.9cm length x 0.9cm width x 0.1cm depth;  0.636cm^2 area and 0.064cm^3 volume. There is Fat Layer (Subcutaneous Tissue) exposed. There is no tunneling or undermining noted. There is a medium amount of serosanguineous drainage noted. The wound margin is distinct with the outline attached to the wound base. There is large (67-100%) pink, pale granulation within the wound bed. There is a small (1-33%) amount of necrotic tissue within the wound bed including Adherent Slough. 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 - ***[facility] - extra time 60 minutes*** Bathing/ Shower/ Hygiene: May shower with protection but do not get wound dressing(s) wet. - May use cast protectors over wraps. You can find these at [facility] or [facility] Edema Control - Lymphedema / SCD / Other: Elevate legs to the level of the heart or above for 30 minutes daily and/or when sitting, a frequency of: Avoid standing for long periods of time. Compression stocking  or Garment 20-30 mm/Hg pressure to: - patient and family to purchase compression stockings. Bring in weekly. do not wear until wound center says to do so. Home Health: New wound care orders this week; continue Home Health for wound care. May utilize formulary equivalent dressing for wound treatment orders unless otherwise specified. - ***Home Health to please have physical therapy evaluate and treat.*** ****[place] TO BOTH LEGS WITH DRESSING CHANGES.*** ****PLEASE APPLY CALICUM ALGINATE AND ABD PADS TO ALL OPEN AND BLISTERED AREAS.**** Other Home Health  Orders/Instructions: - [facility] WOUND #1: - Malleolus Wound Laterality: Right, Medial Cleanser: Wound Cleanser (Home Health) 2 x Per Week/15 Days Discharge Instructions: Cleanse the wound with wound cleanser prior to applying a clean dressing using gauze sponges, not tissue or cotton balls. Cleanser: Soap and Water [facility]) 2 x Per Week/15 Days Discharge Instructions: May shower and wash wound with dial antibacterial soap and water prior to dressing change. Peri-Wound Care: Sween Lotion (Moisturizing lotion) (Home Health) 2 x Per Week/15 Days Discharge Instructions: Apply moisturizing lotion as directed Prim Dressing: KerraCel Ag Gelling Fiber Dressing, 4x5 in (silver alginate) (Home Health) 2 x Per Week/15 Days ary Discharge Instructions: Apply silver alginate to wound bed as instructed Secondary Dressing: ABD Pad, 5x9 ([place]) 2 x Per Week/15 Days Discharge Instructions: Apply over primary dressing as directed. Com pression Wrap: kerlix/coban comression wrap 2 x Per Week/15 Days Discharge Instructions: wrap from base of toes to tibial tuberosity WOUND #13: - Lower Leg Wound Laterality: Left, Anterior Cleanser: Wound Cleanser (Home Health) 2 x Per Week/15 Days Discharge Instructions: Cleanse the wound with wound cleanser prior to applying a clean dressing using gauze sponges, not tissue or cotton balls. Cleanser: Soap and Water [facility]) 2 x Per Week/15 Days Discharge Instructions: May shower and wash wound with dial antibacterial soap and water prior to dressing change. Peri-Wound Care: Sween Lotion (Moisturizing lotion) (Home Health) 2 x Per Week/15 Days Discharge Instructions: Apply moisturizing lotion as directed Prim Dressing: KerraCel Ag Gelling Fiber Dressing, 4x5 in (silver alginate) (Home Health) 2 x Per Week/15 Days ary Discharge Instructions: Apply silver alginate to wound bed as instructed Secondary Dressing: ABD Pad, 5x9 ([place]) 2 x Per Week/15  Days Discharge Instructions: Apply over primary dressing as directed. Com pression Wrap: kerlix/coban comression wrap 2 x Per Week/15 Days Discharge Instructions: wrap from base of toes to tibial tuberosity #1 calcium alginate and I want kerlix Coban here not kerlix Ace legs are doing 2. If the left leg continues to breakdown we may need to venture to a light 3 layer compression 3. Patient has a combination of severe venous insufficiency and arterial wounds. Vascular does not feel they can revascularize or needs to be revascularized right now. We will see how this goes Electronic Signature(s) Signed: 08/23/2020 5:06:47 PM By: Linton Ham MD Entered By: Linton Ham on 08/23/2020 11:21:04 -------------------------------------------------------------------------------- SuperBill Details Patient Name: Date of Service: [name], [name]. 08/23/2020 Medical Record Number: [id number] Patient Account Number: [id number] Date of Birth/Sex: Treating RN: April 17, 1941 (80 y.o. 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 L97.818 Non-pressure chronic ulcer of other part of right lower leg with  other specified severity E11.9 Type 2 diabetes mellitus without complications Facility Procedures CPT4 Code: [id number] Description: [phone] - WOUND CARE VISIT-LEV 5 EST PT Modifier: Quantity: 1 Physician Procedures : CPT4 Code Description Modifier [id number] - WC PHYS LEVEL 3 - EST PT ICD-10 Diagnosis Description I70.203 Unspecified atherosclerosis of native  arteries of extremities, bilateral legs I87.333 Chronic venous hypertension (idiopathic) with ulcer and  inflammation of bilateral lower extremity L97.228 Non-pressure chronic ulcer of left calf with other specified severity L97.818 Non-pressure chronic ulcer of other part of right lower leg with other specified severity Quantity: 1 Electronic Signature(s) Signed: 08/23/2020 5:06:47 PM By: Linton Ham MD Signed: 08/23/2020 6:21:36 PM By: [name] Entered By: [name] on 08/23/2020 13:13:24

## 2020-08-23 NOTE — Progress Notes (Addendum)
Cartier, HAJI GALLUCCIO ([id number]) Visit Report for 08/23/2020 Arrival Information Details Patient Name: Date of Service: [name] 08/23/2020 10:45 A M Medical Record Number: [id number] Patient Account Number: [id number] Date of Birth/Sex: Treating RN: 08-11-1940 (80 y.o. [name] Primary Care [name]: [name] Other Clinician: Referring [name]: Treating [name]/Extender: [name] in Treatment: 10 Visit Information History Since Last Visit Added or deleted any medications: No Patient Arrived: Wheel Chair Any new allergies or adverse reactions: No Arrival Time: 10:11 Had a fall or experienced change in No Transfer Assistance: Civil Service fast streamer activities of daily living that may affect Patient Identification Verified: Yes risk of falls: Secondary Verification Process Completed: Yes Signs or symptoms of abuse/neglect since last visito No Patient Requires Transmission-Based Precautions: No Hospitalized since last visit: No Patient Has Alerts: No Implantable device outside of the clinic excluding No cellular tissue based products placed in the center since last visit: Has Dressing in Place as Prescribed: Yes Has Compression in Place as Prescribed: Yes Pain Present Now: Yes Electronic Signature(s) Signed: 08/23/2020 5:52:28 PM By: [name] Entered By: [name] on 08/23/2020 10:15:13 -------------------------------------------------------------------------------- Clinic Level of Care Assessment Details Patient Name: Date of Service: [name], [name] 08/23/2020 10:45 A M Medical Record Number: [id number] Patient Account Number: [id number] Date of Birth/Sex: Treating RN: October 11, 1940 (80 y.o. [name], [name] Primary Care [name]: [name] Other Clinician: Referring Soundra Lampley: Treating [name]/Extender: [name] in Treatment: 10 Clinic Level of Care Assessment Items TOOL  4 Quantity Score X- 1 0 Use when only an EandM is performed on FOLLOW-UP visit ASSESSMENTS - Nursing Assessment / Reassessment X- 1 10 Reassessment of Co-morbidities (includes updates in patient status) X- 1 5 Reassessment of Adherence to Treatment Plan ASSESSMENTS - Wound and Skin A ssessment / Reassessment []  - 0 Simple Wound Assessment / Reassessment - one wound X- 3 5 Complex Wound Assessment / Reassessment - multiple wounds X- 1 10 Dermatologic / Skin Assessment (not related to wound area) ASSESSMENTS - Focused Assessment X- 2 5 Circumferential Edema Measurements - multi extremities X- 1 10 Nutritional Assessment / Counseling / Intervention []  - 0 Lower Extremity Assessment (monofilament, tuning fork, pulses) []  - 0 Peripheral Arterial Disease Assessment (using hand held doppler) ASSESSMENTS - Ostomy and/or Continence Assessment and Care []  - 0 Incontinence Assessment and Management []  - 0 Ostomy Care Assessment and Management (repouching, etc.) PROCESS - Coordination of Care []  - 0 Simple Patient / Family Education for ongoing care X- 1 20 Complex (extensive) Patient / Family Education for ongoing care X- 1 10 Staff obtains Programmer, systems, Records, T Results / Process Orders est X- 1 10 Staff telephones HHA, Nursing Homes / Clarify orders / etc []  - 0 Routine Transfer to another Facility (non-emergent condition) []  - 0 Routine Hospital Admission (non-emergent condition) []  - 0 New Admissions / Biomedical engineer / Ordering NPWT Apligraf, etc. , []  - 0 Emergency Hospital Admission (emergent condition) []  - 0 Simple Discharge Coordination X- 1 15 Complex (extensive) Discharge Coordination PROCESS - Special Needs []  - 0 Pediatric / Minor Patient Management []  - 0 Isolation Patient Management []  - 0 Hearing / Language / Visual special needs []  - 0 Assessment of Community assistance (transportation, D/C planning, etc.) []  - 0 Additional assistance /  Altered mentation []  - 0 Support Surface(s) Assessment (bed, cushion, seat, etc.) INTERVENTIONS - Wound Cleansing / Measurement []  - 0 Simple Wound Cleansing - one wound X- 3 5 Complex  Wound Cleansing - multiple wounds X- 1 5 Wound Imaging (photographs - any number of wounds) []  - 0 Wound Tracing (instead of photographs) []  - 0 Simple Wound Measurement - one wound X- 3 5 Complex Wound Measurement - multiple wounds INTERVENTIONS - Wound Dressings []  - 0 Small Wound Dressing one or multiple wounds []  - 0 Medium Wound Dressing one or multiple wounds X- 2 20 Large Wound Dressing one or multiple wounds []  - 0 Application of Medications - topical []  - 0 Application of Medications - injection INTERVENTIONS - Miscellaneous []  - 0 External ear exam []  - 0 Specimen Collection (cultures, biopsies, blood, body fluids, etc.) []  - 0 Specimen(s) / Culture(s) sent or taken to Lab for analysis []  - 0 Patient Transfer (multiple staff / Civil Service fast streamer / Similar devices) []  - 0 Simple Staple / Suture removal (25 or less) []  - 0 Complex Staple / Suture removal (26 or more) []  - 0 Hypo / Hyperglycemic Management (close monitor of Blood Glucose) []  - 0 Ankle / Brachial Index (ABI) - do not check if billed separately X- 1 5 Vital Signs Has the patient been seen at the hospital within the last three years: Yes Total Score: 195 Level Of Care: New/Established - Level 5 Electronic Signature(s) Signed: 08/23/2020 6:21:36 PM By: [name] Entered By: [name] on 08/23/2020 13:13:17 -------------------------------------------------------------------------------- Encounter Discharge Information Details Patient Name: Date of Service: [name], [name] 08/23/2020 10:45 A M Medical Record Number: [id number] Patient Account Number: [id number] Date of Birth/Sex: Treating RN: 16-Sep-1940 (80 y.o. [name] Primary Care [name]: [name] Other Clinician: Referring  [name]: Treating [name]/Extender: [name] in Treatment: 10 Encounter Discharge Information Items Discharge Condition: Stable Ambulatory Status: Wheelchair Discharge Destination: Home Transportation: Private Auto Accompanied By: alone Schedule Follow-up Appointment: Yes Clinical Summary of Care: Patient Declined Electronic Signature(s) Signed: 08/23/2020 5:41:41 PM By: Levan Hurst RN, BSN Entered By: Levan Hurst on 08/23/2020 17:11:07 -------------------------------------------------------------------------------- Lower Extremity Assessment Details Patient Name: Date of Service: [name], [name] 08/23/2020 10:45 A M Medical Record Number: [id number] Patient Account Number: [id number] Date of Birth/Sex: Treating RN: 03-24-1941 (80 y.o. [name] Primary Care Rose Hippler: [name] Other Clinician: Referring Velvie Thomaston: Treating Manilla Strieter/Extender: [name] in Treatment: 10 Edema Assessment Assessed: [name]: Yes] [name]: Yes] Edema: [Left: Yes] [Right: Yes] Calf Left: Right: Point of Measurement: 29 cm From Medial Instep 36 cm 31 cm Ankle Left: Right: Point of Measurement: 10 cm From Medial Instep 25 cm 21.5 cm Vascular Assessment Pulses: Dorsalis Pedis Palpable: [Left:No] [Right:No] Electronic Signature(s) Signed: 08/23/2020 5:52:28 PM By: [name] Entered By: [name] on 08/23/2020 10:37:20 -------------------------------------------------------------------------------- Multi Wound Chart Details Patient Name: Date of Service: [name], [name] 08/23/2020 10:45 A M Medical Record Number: [id number] Patient Account Number: [id number] Date of Birth/Sex: Treating RN: [birth year] (80 y.o. M) Primary Care Mica Ramdass: [name] Other Clinician: Referring Marilyn Wing: Treating Harumi Yamin/Extender: [name] in Treatment: 10 Vital  Signs Height(in): 77 Pulse(bpm): 20 Weight(lbs): 178 Blood Pressure(mmHg): 112/58 Body Mass Index(BMI): 25 Temperature(F): 97.7 Respiratory Rate(breaths/min): 18 Photos: [1:No Photos Right, Medial Malleolus] [11:No Photos Left T Great oe] [13:No Photos Left, Anterior Lower Leg] Wound Location: [1:Gradually Appeared] [11:Gradually Appeared] [13:Blister] Wounding Event: [1:Venous Leg Ulcer] [11:Lymphedema] [13:Venous Leg Ulcer] Primary Etiology: [1:Lymphedema] [11:N/A] [13:N/A] 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, Chronic Obstructive] [11:Anemia, Chronic Obstructive] [13:Anemia, Chronic Obstructive] Comorbid History: [1:Pulmonary Disease (COPD), Arrhythmia, Congestive Heart Failure, Arrhythmia, Congestive Heart Failure, Arrhythmia,  Congestive Heart Failure, Coronary Artery Disease, Hypertension, Cirrhosis , Osteoarthritis Hypertension, Cirrhosis ,  Osteoarthritis Hypertension, Cirrhosis , Osteoarthritis 11/20/2019] [11:Pulmonary Disease (COPD), Coronary Artery Disease, 06/26/2020] [13:Pulmonary Disease (COPD), Coronary Artery Disease, 08/14/2020] Date Acquired: [1:10] [11:8] [13:1] Weeks of Treatment: [1:Open] [11:Healed - Epithelialized] [13:Open] Wound Status: [1:0.3x0.4x0.1] [11:0x0x0] [13:0.9x0.9x0.1] Measurements L x W x D (cm) [1:0.094] [11:0] [13:0.636] A (cm) : rea [1:0.009] [11:0] [13:0.064] Volume (cm) : [1:99.20%] [11:100.00%] [13:71.10%] % Reduction in Area: [1:99.20%] [11:100.00%] [13:70.90%] % Reduction in Volume: [1:Full Thickness Without Exposed] [11:Full Thickness Without Exposed] [13:Full Thickness Without Exposed] Classification: [1:Support Structures Medium] [11:Support Structures N/A] [13:Support Structures Medium] Exudate Amount: [1:Serosanguineous] [11:N/A] [13:Serosanguineous] Exudate Type: [1:red, brown] [11:N/A] [13:red, brown] Exudate Color: [1:Distinct, outline attached] [11:N/A] [13:Distinct, outline attached] Wound Margin: [1:Large (67-100%)] [11:N/A] [13:Large  (67-100%)] Granulation Amount: [1:Pink] [11:N/A] [13:Pink, Pale] Granulation Quality: [1:Small (1-33%)] [11:N/A] [13:Small (1-33%)] Necrotic Amount: [1:Fat Layer (Subcutaneous Tissue): Yes N/A] [13:Fat Layer (Subcutaneous Tissue): Yes] Exposed Structures: [1:Fascia: No Tendon: No Muscle: No Joint: No Bone: No Medium (34-66%)] [11:N/A] [13:Fascia: No Tendon: No Muscle: No Joint: No Bone: No Medium (34-66%)] Treatment Notes Electronic Signature(s) Signed: 08/23/2020 5:06:47 PM By: Linton Ham MD Entered By: Linton Ham on 08/23/2020 11:16:42 -------------------------------------------------------------------------------- Multi-Disciplinary Care Plan Details Patient Name: Date of Service: [name], [name] 08/23/2020 10:45 A M Medical Record Number: [id number] Patient Account Number: [id number] Date of Birth/Sex: Treating RN: 05-08-1940 (80 y.o. [name] Primary Care Karthika Glasper: [name] Other Clinician: Referring Corinthian Mizrahi: Treating Dorothee Napierkowski/Extender: [name] in Treatment: 10 Active Inactive Electronic Signature(s) Signed: 09/06/2020 5:50:05 PM By: [name] Previous Signature: 08/23/2020 6:21:36 PM Version By: [name] Entered By: [name] on 09/06/2020 17:50:04 -------------------------------------------------------------------------------- Pain Assessment Details Patient Name: Date of Service: [name], [name] 08/23/2020 10:45 A M Medical Record Number: [id number] Patient Account Number: [id number] Date of Birth/Sex: Treating RN: 14-Aug-1940 (81 y.o. [name] Primary Care Silviano Neuser: [name] Other Clinician: Referring Kayleigh Broadwell: Treating Hartman Minahan/Extender: [name] in Treatment: 10 Active Problems Location of Pain Severity and Description of Pain Patient Has Paino Yes Site Locations Pain Location: Generalized Pain With Dressing Change: No Duration  of the Pain. Constant / Intermittento Intermittent Character of Pain Describe the Pain: Burning Pain Management and Medication Current Pain Management: Medication: Yes Cold Application: No Rest: No Massage: No Activity: No T.E.N.S.: No Heat Application: No Leg drop or elevation: No Is the Current Pain Management Adequate: Inadequate How does your wound impact your activities of daily livingo Sleep: No Bathing: No Appetite: No Relationship With Others: No Bladder Continence: No Emotions: No Bowel Continence: No Work: No Toileting: No Drive: No Dressing: No Hobbies: No Electronic Signature(s) Signed: 08/23/2020 5:52:28 PM By: [name] Entered By: [name] on 08/23/2020 10:16:09 -------------------------------------------------------------------------------- Patient/Caregiver Education Details Patient Name: Date of Service: [name], [name] 5/5/2022andnbsp10:45 A M Medical Record Number: [id number] Patient Account Number: [id number] Date of Birth/Gender: Treating RN: 03/16/1941 (80 y.o. [name] Primary Care Physician: [name] Other Clinician: Referring Physician: Treating Physician/Extender: [name] in Treatment: 10 Education Assessment Education Provided To: Patient Education Topics Provided Wound/Skin Impairment: Handouts: Skin Care Do's and Dont's Methods: Explain/Verbal Responses: Reinforcements needed Electronic Signature(s) Signed: 08/23/2020 6:21:36 PM By: [name] Entered By: [name] on 08/23/2020 11:12:07 -------------------------------------------------------------------------------- Wound Assessment Details Patient Name: Date of Service: [name], [name] 08/23/2020 10:45 A M Medical Record Number: [id number] Patient Account Number: [id number] Date of Birth/Sex:  Treating RN: May 11, 1940 (80 y.o. [name] Primary Care Margaretha Mahan: [name] Other  Clinician: Referring Nealie Mchatton: Treating Mozes Sagar/Extender: [name] in Treatment: 10 Wound Status Wound Number: 1 Primary Venous Leg Ulcer Etiology: Wound Location: Right, Medial Malleolus Secondary Lymphedema Wounding Event: Gradually Appeared Etiology: Date Acquired: 11/20/2019 Wound Open Weeks Of Treatment: 10 Status: Clustered Wound: No Comorbid Anemia, Chronic Obstructive Pulmonary Disease (COPD), History: Arrhythmia, Congestive Heart Failure, Coronary Artery Disease, Hypertension, Cirrhosis , Osteoarthritis Photos Wound Measurements Length: (cm) 0.3 Width: (cm) 0.4 Depth: (cm) 0.1 Area: (cm) 0.094 Volume: (cm) 0.009 % Reduction in Area: 99.2% % Reduction in Volume: 99.2% Epithelialization: Medium (34-66%) Tunneling: No Undermining: No Wound Description Classification: Full Thickness Without Exposed Support Structures Wound Margin: Distinct, outline attached Exudate Amount: Medium Exudate Type: Serosanguineous Exudate Color: red, brown Foul Odor After Cleansing: No Slough/Fibrino Yes Wound Bed Granulation Amount: Large (67-100%) Exposed Structure Granulation Quality: Pink Fascia Exposed: No Necrotic Amount: Small (1-33%) Fat Layer (Subcutaneous Tissue) Exposed: Yes Necrotic Quality: Adherent Slough Tendon Exposed: No Muscle Exposed: No Joint Exposed: No Bone Exposed: No Electronic Signature(s) Signed: 08/23/2020 5:12:45 PM By: Sandre Kitty Signed: 08/23/2020 5:52:28 PM By: [name] Entered By: Sandre Kitty on 08/23/2020 16:35:00 -------------------------------------------------------------------------------- Wound Assessment Details Patient Name: Date of Service: [name], [name] 08/23/2020 10:45 A M Medical Record Number: [id number] Patient Account Number: [id number] Date of Birth/Sex: Treating RN: 1941/02/12 (80 y.o. [name] Primary Care Yocheved Depner: [name] Other  Clinician: Referring Steaven Wholey: Treating Aleyna Cueva/Extender: [name] in Treatment: 10 Wound Status Wound Number: 11 Primary Lymphedema Etiology: Wound Location: Left T Great oe Wound Healed - Epithelialized Wounding Event: Gradually Appeared Status: Date Acquired: 06/26/2020 Comorbid Anemia, Chronic Obstructive Pulmonary Disease (COPD), Weeks Of Treatment: 8 History: Arrhythmia, Congestive Heart Failure, Coronary Artery Disease, Clustered Wound: No Hypertension, Cirrhosis , Osteoarthritis Wound Measurements Length: (cm) Width: (cm) Depth: (cm) Area: (cm) Volume: (cm) 0 % Reduction in Area: 100% 0 % Reduction in Volume: 100% 0 0 0 Wound Description Classification: Full Thickness Without Exposed Support Structur es Electronic Signature(s) Signed: 08/23/2020 5:52:28 PM By: [name] Entered By: [name] on 08/23/2020 10:42:53 -------------------------------------------------------------------------------- Wound Assessment Details Patient Name: Date of Service: [name], [name] 08/23/2020 10:45 A M Medical Record Number: [id number] Patient Account Number: [id number] Date of Birth/Sex: Treating RN: 28-May-1940 (80 y.o. [name] Primary Care Natthew Marlatt: [name] Other Clinician: Referring Antwon Rochin: Treating Alexah Kivett/Extender: [name] in Treatment: 10 Wound Status Wound Number: 13 Primary Venous Leg Ulcer Etiology: Wound Location: Left, Anterior Lower Leg Wound Open Wounding Event: Blister Status: Date Acquired: 08/14/2020 Comorbid Anemia, Chronic Obstructive Pulmonary Disease (COPD), Weeks Of Treatment: 1 History: Arrhythmia, Congestive Heart Failure, Coronary Artery Disease, Clustered Wound: No Hypertension, Cirrhosis , Osteoarthritis Photos Wound Measurements Length: (cm) 0.9 Width: (cm) 0.9 Depth: (cm) 0.1 Area: (cm) 0.636 Volume: (cm) 0.064 % Reduction in  Area: 71.1% % Reduction in Volume: 70.9% Epithelialization: Medium (34-66%) Tunneling: No Undermining: No Wound Description Classification: Full Thickness Without Exposed Support Structures Wound Margin: Distinct, outline attached Exudate Amount: Medium Exudate Type: Serosanguineous Exudate Color: red, brown Foul Odor After Cleansing: No Slough/Fibrino Yes Wound Bed Granulation Amount: Large (67-100%) Exposed Structure Granulation Quality: Pink, Pale Fascia Exposed: No Necrotic Amount: Small (1-33%) Fat Layer (Subcutaneous Tissue) Exposed: Yes Necrotic Quality: Adherent Slough Tendon Exposed: No Muscle Exposed: No Joint Exposed: No Bone Exposed: No Electronic Signature(s) Signed: 08/23/2020 5:12:45 PM By: Sandre Kitty Signed: 08/23/2020 5:52:28  PM By: Fara Chute By: Sandre Kitty on 08/23/2020 16:35:25 -------------------------------------------------------------------------------- Vitals Details Patient Name: Date of Service: [name], [name] 08/23/2020 10:45 A M Medical Record Number: [id number] Patient Account Number: [id number] Date of Birth/Sex: Treating RN: 04/09/1941 (80 y.o. [name] Primary Care Ketara Cavness: [name] Other Clinician: Referring Elga Santy: Treating Jon Kasparek/Extender: [name] in Treatment: 10 Vital Signs Time Taken: 10:15 Temperature (F): 97.7 Height (in): 71 Pulse (bpm): 60 Weight (lbs): 178 Respiratory Rate (breaths/min): 18 Body Mass Index (BMI): 24.8 Blood Pressure (mmHg): 112/58 Reference Range: 80 - 120 mg / dl Electronic Signature(s) Signed: 08/23/2020 5:52:28 PM By: [name] Entered By: [name] on 08/23/2020 10:15:42

## 2020-08-24 DIAGNOSIS — G809 Cerebral palsy, unspecified: Secondary | ICD-10-CM | POA: Diagnosis not present

## 2020-08-24 DIAGNOSIS — L97312 Non-pressure chronic ulcer of right ankle with fat layer exposed: Secondary | ICD-10-CM | POA: Diagnosis not present

## 2020-08-24 DIAGNOSIS — L0390 Cellulitis, unspecified: Secondary | ICD-10-CM | POA: Diagnosis not present

## 2020-08-24 DIAGNOSIS — I890 Lymphedema, not elsewhere classified: Secondary | ICD-10-CM | POA: Diagnosis not present

## 2020-08-24 DIAGNOSIS — I110 Hypertensive heart disease with heart failure: Secondary | ICD-10-CM | POA: Diagnosis not present

## 2020-08-24 DIAGNOSIS — I87333 Chronic venous hypertension (idiopathic) with ulcer and inflammation of bilateral lower extremity: Secondary | ICD-10-CM | POA: Diagnosis not present

## 2020-08-24 DIAGNOSIS — I509 Heart failure, unspecified: Secondary | ICD-10-CM | POA: Diagnosis not present

## 2020-08-24 DIAGNOSIS — J449 Chronic obstructive pulmonary disease, unspecified: Secondary | ICD-10-CM | POA: Diagnosis not present

## 2020-08-24 DIAGNOSIS — E785 Hyperlipidemia, unspecified: Secondary | ICD-10-CM | POA: Diagnosis not present

## 2020-08-24 DIAGNOSIS — I739 Peripheral vascular disease, unspecified: Secondary | ICD-10-CM | POA: Diagnosis not present

## 2020-08-24 DIAGNOSIS — I6529 Occlusion and stenosis of unspecified carotid artery: Secondary | ICD-10-CM | POA: Diagnosis not present

## 2020-08-24 DIAGNOSIS — L97222 Non-pressure chronic ulcer of left calf with fat layer exposed: Secondary | ICD-10-CM | POA: Diagnosis not present

## 2020-08-24 DIAGNOSIS — L97522 Non-pressure chronic ulcer of other part of left foot with fat layer exposed: Secondary | ICD-10-CM | POA: Diagnosis not present

## 2020-08-24 DIAGNOSIS — G894 Chronic pain syndrome: Secondary | ICD-10-CM | POA: Diagnosis not present

## 2020-08-24 DIAGNOSIS — Z79899 Other long term (current) drug therapy: Secondary | ICD-10-CM | POA: Diagnosis not present

## 2020-08-24 DIAGNOSIS — I482 Chronic atrial fibrillation, unspecified: Secondary | ICD-10-CM | POA: Diagnosis not present

## 2020-08-24 DIAGNOSIS — M5416 Radiculopathy, lumbar region: Secondary | ICD-10-CM | POA: Diagnosis not present

## 2020-08-24 DIAGNOSIS — K7460 Unspecified cirrhosis of liver: Secondary | ICD-10-CM | POA: Diagnosis not present

## 2020-08-24 DIAGNOSIS — I714 Abdominal aortic aneurysm, without rupture: Secondary | ICD-10-CM | POA: Diagnosis not present

## 2020-08-25 ENCOUNTER — Other Ambulatory Visit: Payer: Self-pay | Admitting: Internal Medicine

## 2020-08-27 DIAGNOSIS — M549 Dorsalgia, unspecified: Secondary | ICD-10-CM | POA: Diagnosis not present

## 2020-08-27 DIAGNOSIS — J441 Chronic obstructive pulmonary disease with (acute) exacerbation: Secondary | ICD-10-CM | POA: Diagnosis not present

## 2020-08-27 DIAGNOSIS — I739 Peripheral vascular disease, unspecified: Secondary | ICD-10-CM | POA: Diagnosis not present

## 2020-08-27 DIAGNOSIS — G809 Cerebral palsy, unspecified: Secondary | ICD-10-CM | POA: Diagnosis not present

## 2020-08-29 ENCOUNTER — Encounter: Payer: Self-pay | Admitting: Internal Medicine

## 2020-08-30 ENCOUNTER — Telehealth: Payer: Self-pay | Admitting: Internal Medicine

## 2020-08-30 DIAGNOSIS — I714 Abdominal aortic aneurysm, without rupture: Secondary | ICD-10-CM | POA: Diagnosis not present

## 2020-08-30 DIAGNOSIS — L97522 Non-pressure chronic ulcer of other part of left foot with fat layer exposed: Secondary | ICD-10-CM | POA: Diagnosis not present

## 2020-08-30 DIAGNOSIS — I890 Lymphedema, not elsewhere classified: Secondary | ICD-10-CM | POA: Diagnosis not present

## 2020-08-30 DIAGNOSIS — I6529 Occlusion and stenosis of unspecified carotid artery: Secondary | ICD-10-CM | POA: Diagnosis not present

## 2020-08-30 DIAGNOSIS — E785 Hyperlipidemia, unspecified: Secondary | ICD-10-CM | POA: Diagnosis not present

## 2020-08-30 DIAGNOSIS — I110 Hypertensive heart disease with heart failure: Secondary | ICD-10-CM | POA: Diagnosis not present

## 2020-08-30 DIAGNOSIS — L97222 Non-pressure chronic ulcer of left calf with fat layer exposed: Secondary | ICD-10-CM | POA: Diagnosis not present

## 2020-08-30 DIAGNOSIS — K7460 Unspecified cirrhosis of liver: Secondary | ICD-10-CM | POA: Diagnosis not present

## 2020-08-30 DIAGNOSIS — M549 Dorsalgia, unspecified: Secondary | ICD-10-CM

## 2020-08-30 DIAGNOSIS — I509 Heart failure, unspecified: Secondary | ICD-10-CM | POA: Diagnosis not present

## 2020-08-30 DIAGNOSIS — L97312 Non-pressure chronic ulcer of right ankle with fat layer exposed: Secondary | ICD-10-CM | POA: Diagnosis not present

## 2020-08-30 DIAGNOSIS — I87333 Chronic venous hypertension (idiopathic) with ulcer and inflammation of bilateral lower extremity: Secondary | ICD-10-CM | POA: Diagnosis not present

## 2020-08-30 DIAGNOSIS — K7030 Alcoholic cirrhosis of liver without ascites: Secondary | ICD-10-CM

## 2020-08-30 DIAGNOSIS — J449 Chronic obstructive pulmonary disease, unspecified: Secondary | ICD-10-CM | POA: Diagnosis not present

## 2020-08-30 DIAGNOSIS — I482 Chronic atrial fibrillation, unspecified: Secondary | ICD-10-CM | POA: Diagnosis not present

## 2020-08-30 DIAGNOSIS — I739 Peripheral vascular disease, unspecified: Secondary | ICD-10-CM | POA: Diagnosis not present

## 2020-08-30 NOTE — Telephone Encounter (Signed)
Patients daughter states that her father in law is throwing up black and his belly is hard on the left side. He is pale, he is lethargic, and she cannot keep him awake. Advised to call 911. Patients daughter complied and understood.

## 2020-08-30 NOTE — Telephone Encounter (Signed)
    Tammy from [facility] requesting referral to see patient on 5/13  Phone [phone] Fax [phone]

## 2020-08-30 NOTE — Telephone Encounter (Signed)
Patient daughter messaged, father-n-law throwing up black and belly is hard, he is pale, lethargic and cannot keep awake. Patient's daughter advised to call 911. Daughter complied and understood. Another message from [place] from [facility] requesting referral to see patient 08/31/2020. Please advise, thank. See message below.

## 2020-08-30 NOTE — Telephone Encounter (Signed)
Routed this message with the second message from [place] from [facility] to Dr Sharlet Salina.

## 2020-08-31 ENCOUNTER — Telehealth (INDEPENDENT_AMBULATORY_CARE_PROVIDER_SITE_OTHER): Payer: Medicare Other | Admitting: Internal Medicine

## 2020-08-31 ENCOUNTER — Encounter (HOSPITAL_BASED_OUTPATIENT_CLINIC_OR_DEPARTMENT_OTHER): Payer: Medicare Other | Admitting: Internal Medicine

## 2020-08-31 DIAGNOSIS — D509 Iron deficiency anemia, unspecified: Secondary | ICD-10-CM

## 2020-08-31 DIAGNOSIS — D500 Iron deficiency anemia secondary to blood loss (chronic): Secondary | ICD-10-CM

## 2020-08-31 DIAGNOSIS — N1831 Chronic kidney disease, stage 3a: Secondary | ICD-10-CM | POA: Diagnosis not present

## 2020-08-31 DIAGNOSIS — R195 Other fecal abnormalities: Secondary | ICD-10-CM | POA: Diagnosis not present

## 2020-08-31 NOTE — Telephone Encounter (Signed)
Spoke to patient's son Wisam Siefring) who is on the [place] . He stated his father did not go to the ER because he is afraid. He bought stool softener and took 3 tablets used the bathroom and his stools were black, but now brown. Hospice got the referral and is coming out on Sunday. The son will keep [place] aware what is going on with his father.

## 2020-08-31 NOTE — Telephone Encounter (Signed)
Thanks, also did we fax hospice referral?

## 2020-08-31 NOTE — Telephone Encounter (Signed)
See below

## 2020-08-31 NOTE — Telephone Encounter (Signed)
Can we follow up did they go to ER. Symptoms are very concerning why was this not routed to a provider in office?

## 2020-08-31 NOTE — Telephone Encounter (Signed)
Pt states he does not want to go to ER b/c he believes that he is getting better & that his symptoms "always get worse when I go there & it's depressing." Pt states he cannot come in for an in person office visit b/c he is w/c bound. Video visit offered & pt is agreeable.  Appt scheduled with Dr Jenny Reichmann as he was 1st available.

## 2020-08-31 NOTE — Progress Notes (Signed)
Patient ID: [name], male   DOB: 1940-08-16, 80 y.o.   MRN: [id number]  Virtual Visit via Video Note  I connected with [name] on 08/31/20 at  2:40 PM EDT by a video enabled telemedicine application and verified that I am speaking with the correct person using two identifiers.  Location of all participants today Patient: at home Provider: at office   I discussed the limitations of evaluation and management by telemedicine and the availability of in person appointments. The patient expressed understanding and agreed to proceed.  History of Present Illness: Here to f/u recent symptoms, refuses ED evaluation even after EMS called per family with VSS; yesterday felt lethargic and weak, with n/v x 2 of dark liquid per pt but not coffee grounds and pt is convinced this was not blood.  Today overall feeling much improved stamina, no further GI symptoms - Denies worsening reflux, abd pain, dysphagia, n/v, or blood but does have occasional constipation.  .  Also denies orthostatic symptoms - Pt denies chest pain, increased sob or doe, wheezing, orthopnea, PND, increased LE swelling, palpitations, dizziness or syncope.  Does have several wks ongoing nasal allergy symptoms with clearish congestion, itch and sneezing, without fever, pain, ST, cough, swelling or wheezing. Remains wheelchair bound except for commode use.  Did have darker stool yesterday but normal appearance today.   Pt denies fever, wt loss, night sweats, loss of appetite, or other constitutional symptoms  No other new symptoms Past Medical History:  Diagnosis Date  . AAA (abdominal aortic aneurysm) ([place])   . Blindness of left eye   . 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 CAD (coronary artery disease)    a. s/p CABG in 10/2008 with LIMA-LAD, SVG-OM1 with Y-graft to PL branch, and SVG-RCA  . Carotid stenosis   . Cerebrovascular disease   . CHF (congestive heart failure) ([place])   . Cirrhosis  ([place])    on CT a/p 07/2013, no longer drinking  . CONGENITAL UNSPEC REDUCTION DEFORMITY LOWER LIMB   . Constipation due to opioid therapy 2014   began about a year ago  . COPD (chronic obstructive pulmonary disease) ([place])   . HTN (hypertension)   . Hyperlipidemia   . 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 Peripheral vascular disease ([place])   . TOBACCO USE, QUIT    Past Surgical History:  Procedure Laterality Date  . CORONARY ARTERY BYPASS GRAFT  11/03/2008   Ricard Dillon - x4: left internal mammary artery to the distal left anterior descending, saphemous vein graft to the first circumflex marginal branch with a Y graft sequentiallly to a left posterolateral branch, saphenous vein graft to the distal right coronary artery  . ELECTROPHYSIOLOGIC STUDY N/A 01/18/2015   Procedure: A-Flutter Ablation;  Surgeon: Deboraha Sprang, MD;  Location: [facility];  Service: Cardiovascular;  Laterality: N/A;  . GREEN LIGHT LASER TURP (TRANSURETHRAL RESECTION OF PROSTATE N/A 02/14/2014   Procedure: GREEN LIGHT LASER TURP (TRANSURETHRAL RESECTION OF PROSTATE;  Surgeon: Festus Aloe, MD;  Location: [facility];  Service: Urology;  Laterality: N/A;  . HIP ARTHROPLASTY Left 01/26/2018   Procedure: LEFT HIP POSTERIOR HEMIARTHROPLASTY;  Surgeon: [name], MD;  Location: [facility];  Service: Orthopedics;  Laterality: Left;  . HIP CLOSED REDUCTION Left 09/18/2018   Procedure: CLOSED MANIPULATION HIP;  Surgeon: Justice Britain, MD;  Location: [facility];  Service: Orthopedics;  Laterality: Left;  . HIP CLOSED REDUCTION Left 10/08/2018   Procedure:  CLOSED MANIPULATION HIP;  Surgeon: Rod Can, MD;  Location: [facility];  Service: Orthopedics;  Laterality: Left;  . TOTAL HIP REVISION Left 06/24/2018   Procedure: ACETABULAR HIP REVISION POSTERIOR;  Surgeon: [name], MD;  Location: [facility];  Service: Orthopedics;  Laterality: Left;  . TOTAL HIP REVISION Left 10/21/2018   Procedure: POSTERIOR REVISION  HIP WITH POSSIBLE CONSTRAINED LINER;  Surgeon: [name], MD;  Location: [facility];  Service: Orthopedics;  Laterality: Left;    reports that he has quit smoking. He has never used smokeless tobacco. He reports that he does not drink alcohol and does not use drugs. family history includes Cancer in his brother; Cirrhosis in his mother; Heart attack in his father; Other in his brother and brother. Allergies  Allergen Reactions  . Doxycycline   . Gabapentin Other (See Comments)    Pt states make him go down to the floor and can not get up  . Amlodipine Nausea And Vomiting and Other (See Comments)    dizziness  . Fish Allergy Nausea And Vomiting    STATES HE HAS NOT EATEN ANY FISH INCLUDING SHELLFISH FOR PAST 40 YRS - IT CAUSED NAUSEA  . Hydrocodone Itching and Rash  . Other Nausea And Vomiting    STATES HE HAS NOT EATEN ANY FISH INCLUDING SHELLFISH FOR PAST 40 YRS - IT CAUSED NAUSEA  . Tylenol [Acetaminophen] Other (See Comments)    Liver problems   Current Outpatient Medications on File Prior to Visit  Medication Sig Dispense Refill  . albuterol (VENTOLIN HFA) 108 (90 Base) MCG/ACT inhaler TAKE 2 PUFFS BY MOUTH EVERY 6 HOURS AS NEEDED FOR WHEEZE OR SHORTNESS OF BREATH 18 each 2  . b complex vitamins tablet Take 1 tablet by mouth daily.    . buprenorphine (BUTRANS) 7.5 MCG/HR Place 1 patch onto the skin once a week.    . Cholecalciferol (VITAMIN D3 PO) Take by mouth.    . DULoxetine (CYMBALTA) 30 MG capsule TAKE 1 CAPSULE BY MOUTH EVERY DAY 90 capsule 2  . ELIQUIS 5 MG TABS tablet TAKE 1 TABLET BY MOUTH TWICE A DAY 180 tablet 3  . ferrous sulfate 325 (65 FE) MG tablet Take 325 mg by mouth daily with breakfast.    . finasteride (PROSCAR) 5 MG tablet TAKE 1 TABLET BY MOUTH EVERY DAY 90 tablet 1  . fluticasone (FLONASE) 50 MCG/ACT nasal spray PLACE 1 SPRAY INTO BOTH NOSTRILS EVERY MORNING. 48 mL 1  . Guaifenesin 1200 MG TB12 Take 1 tablet (1,200 mg total) by mouth 2 (two) times daily.  (Patient taking differently: Take 1,200 mg by mouth 2 (two) times daily as needed (mucus).) 180 each 3  . lactulose (CHRONULAC) 10 GM/15ML solution TAKE 30 MLS BY MOUTH 2 TIMES DAILY AS NEEDED FOR MILD CONSTIPATION OR MODERATE CONSTIPATION (Patient taking differently: Take 20 g by mouth 2 (two) times daily as needed for mild constipation.) 1892 mL 6  . Misc. Devices (TRANSFER BENCH) MISC Use for transfers prn 1 each 0  . Multiple Vitamins-Minerals (ZINC PO) Take by mouth.    . Naloxone HCl (NARCAN NA) Place 1 application into the nose once.    . nystatin ointment (MYCOSTATIN) APPLY TO AFFECTED AREA TWICE A DAY 30 g 3  . oxyCODONE (ROXICODONE) 15 MG immediate release tablet Take 1 tablet (15 mg total) by mouth every 4 (four) hours as needed for pain. 150 tablet 0  . pantoprazole (PROTONIX) 20 MG tablet Take 1 tablet (20 mg total) by mouth  2 (two) times daily. 180 tablet 0  . predniSONE (DELTASONE) 10 MG tablet TAKE 2 TABLETS (20 MG TOTAL) BY MOUTH DAILY WITH BREAKFAST. 60 tablet 2  . rosuvastatin (CRESTOR) 40 MG tablet Take 1 tablet (40 mg total) by mouth daily. 90 tablet 3  . spironolactone (ALDACTONE) 25 MG tablet TAKE 1 TABLET BY MOUTH EVERY DAY 90 tablet 1  . sulfamethoxazole-trimethoprim (BACTRIM DS) 800-160 MG tablet Take 1 tablet by mouth 2 (two) times daily. 14 tablet 0  . SYMBICORT 160-4.5 MCG/ACT inhaler TAKE 2 PUFFS BY MOUTH TWICE A DAY 10.2 each 3  . tamsulosin (FLOMAX) 0.4 MG CAPS capsule TAKE 1 CAPSULE BY MOUTH EVERY DAY 90 capsule 1  . traZODone (DESYREL) 100 MG tablet TAKE 5 TABLETS BY MOUTH AT BEDTIME     No current facility-administered medications on file prior to visit.    Observations/Objective: Alert, NAD, appropriate mood and affect, resps normal, cn 2-12 intact, moves all 4s, no visible rash or swelling Lab Results  Component Value Date   WBC 11.5 (H) 07/10/2020   HGB 8.9 (L) 07/10/2020   HCT 29.0 (L) 07/10/2020   PLT 204 07/10/2020   GLUCOSE 156 (H) 07/10/2020    CHOL 185 02/13/2020   TRIG 295 (H) 02/13/2020   HDL 52 02/13/2020   LDLCALC 85 02/13/2020   ALT 15 07/10/2020   AST 17 07/10/2020   NA 136 07/10/2020   K 3.3 (L) 07/10/2020   CL 101 07/10/2020   CREATININE 2.08 (H) 07/10/2020   BUN 37 (H) 07/10/2020   CO2 24 07/10/2020   TSH 3.48 12/22/2014   PSA 10.64 (H) 10/21/2017   INR 1.6 (H) 07/06/2020   HGBA1C 5.7 04/11/2013   Assessment and Plan: See notes  Follow Up Instructions: See notes   I discussed the assessment and treatment plan with the patient. The patient was provided an opportunity to ask questions and all were answered. The patient agreed with the plan and demonstrated an understanding of the instructions.   The patient was advised to call back or seek an in-person evaluation if the symptoms worsen or if the condition fails to improve as anticipated.   Cathlean Cower, MD

## 2020-08-31 NOTE — Telephone Encounter (Signed)
[name] with [facility] calling, states they still havent got the fax for the referral, and they wanted to know if Dr. Sharlet Salina will be the attending following care

## 2020-08-31 NOTE — Telephone Encounter (Signed)
I would recommend go to ER immediately with symptoms in prior messages. He could have serious problems which could lead to death. I have done referral to hospice please fax. If not agreeable to go to ER needs visit either in office or virtual ASAP with first available provider.

## 2020-08-31 NOTE — Telephone Encounter (Signed)
[facility] Report/Call: ---Caller states that her father in law is throwing up black and his belly is hard on the left side. He is pale, he is lethargic, and she cannot keep him awake.  Advised: CALL EMS NOW. Upon call back caller said she hadn't called 911 services yet because he is saying he wants to die and has given up and told her not to call. She has called her husband and they do not know what to do. Advised to call 911 services anyway let them come out and talk with him and if he still refuses care she has done everything she can do.

## 2020-08-31 NOTE — Telephone Encounter (Signed)
Night call: Requesting hospice care for tomorrow. Referral was sent to office. Document must be submitted by 600pm today (08/30/20).

## 2020-09-02 ENCOUNTER — Other Ambulatory Visit: Payer: Self-pay | Admitting: Cardiology

## 2020-09-02 ENCOUNTER — Other Ambulatory Visit: Payer: Self-pay | Admitting: Internal Medicine

## 2020-09-03 NOTE — Telephone Encounter (Signed)
Patient's chart was check for the referral, it was sent on 08/31/2020 by Dr Sharlet Salina. I spoke to the patient son on 08/31/2020 after reviewing HIPPA, he stated Hospice was coming on Sunday to see the patient.

## 2020-09-06 ENCOUNTER — Encounter: Payer: Self-pay | Admitting: Internal Medicine

## 2020-09-06 DIAGNOSIS — R195 Other fecal abnormalities: Secondary | ICD-10-CM | POA: Insufficient documentation

## 2020-09-06 NOTE — Assessment & Plan Note (Signed)
I suspect pt may have had limited GI bleed, but declines ED evaluation, today symptoms resovled per pt; pt does agree for lab evaluation with cbc and other, continue current med including protonix, lactulose (is not on anticoagulant)

## 2020-09-06 NOTE — Patient Instructions (Signed)
Please continue all other medications as before, and refills have been done if requested.  Please have the pharmacy call with any other refills you may need.  Please keep your appointments with your specialists as you may have planned  Please go to the LAB at the blood drawing area for the tests to be done at the [facility] lab site when you are next able  You will be contacted by phone if any changes need to be made immediately.  Otherwise, you will receive a letter about your results with an explanation, but please check with MyChart first

## 2020-09-06 NOTE — Assessment & Plan Note (Signed)
For lab f/u as above,  to f/u any worsening symptoms or concerns Lab Results  Component Value Date   CREATININE 2.08 (H) 07/10/2020   Stable overall, cont to avoid nephrotoxins

## 2020-09-06 NOTE — Assessment & Plan Note (Signed)
For cbc with labs as above,  to f/u any worsening symptoms or concerns

## 2020-09-10 ENCOUNTER — Other Ambulatory Visit: Payer: Self-pay | Admitting: Internal Medicine

## 2020-09-12 NOTE — Telephone Encounter (Signed)
Is this medication long term for him? Please advise

## 2020-09-14 ENCOUNTER — Emergency Department (HOSPITAL_COMMUNITY)

## 2020-09-14 ENCOUNTER — Other Ambulatory Visit: Payer: Self-pay

## 2020-09-14 ENCOUNTER — Encounter (HOSPITAL_COMMUNITY): Payer: Self-pay

## 2020-09-14 ENCOUNTER — Inpatient Hospital Stay (HOSPITAL_COMMUNITY)
Admission: EM | Admit: 2020-09-14 | Discharge: 2020-09-20 | DRG: 871 | Disposition: A | Discharge status: Hospice | Attending: Internal Medicine | Admitting: Internal Medicine

## 2020-09-14 ENCOUNTER — Encounter: Payer: Self-pay | Admitting: Physician Assistant

## 2020-09-14 DIAGNOSIS — E46 Unspecified protein-calorie malnutrition: Secondary | ICD-10-CM | POA: Diagnosis present

## 2020-09-14 DIAGNOSIS — G894 Chronic pain syndrome: Secondary | ICD-10-CM | POA: Diagnosis present

## 2020-09-14 DIAGNOSIS — A4189 Other specified sepsis: Secondary | ICD-10-CM | POA: Diagnosis present

## 2020-09-14 DIAGNOSIS — Z9079 Acquired absence of other genital organ(s): Secondary | ICD-10-CM

## 2020-09-14 DIAGNOSIS — D500 Iron deficiency anemia secondary to blood loss (chronic): Secondary | ICD-10-CM | POA: Diagnosis not present

## 2020-09-14 DIAGNOSIS — I441 Atrioventricular block, second degree: Secondary | ICD-10-CM | POA: Diagnosis present

## 2020-09-14 DIAGNOSIS — H5462 Unqualified visual loss, left eye, normal vision right eye: Secondary | ICD-10-CM | POA: Diagnosis present

## 2020-09-14 DIAGNOSIS — R195 Other fecal abnormalities: Secondary | ICD-10-CM | POA: Diagnosis not present

## 2020-09-14 DIAGNOSIS — G8929 Other chronic pain: Secondary | ICD-10-CM | POA: Diagnosis not present

## 2020-09-14 DIAGNOSIS — I739 Peripheral vascular disease, unspecified: Secondary | ICD-10-CM | POA: Diagnosis present

## 2020-09-14 DIAGNOSIS — D509 Iron deficiency anemia, unspecified: Secondary | ICD-10-CM | POA: Diagnosis present

## 2020-09-14 DIAGNOSIS — U071 COVID-19: Secondary | ICD-10-CM | POA: Diagnosis not present

## 2020-09-14 DIAGNOSIS — R4182 Altered mental status, unspecified: Secondary | ICD-10-CM | POA: Diagnosis present

## 2020-09-14 DIAGNOSIS — R627 Adult failure to thrive: Secondary | ICD-10-CM | POA: Diagnosis present

## 2020-09-14 DIAGNOSIS — S12001A Unspecified nondisplaced fracture of first cervical vertebra, initial encounter for closed fracture: Secondary | ICD-10-CM

## 2020-09-14 DIAGNOSIS — N401 Enlarged prostate with lower urinary tract symptoms: Secondary | ICD-10-CM | POA: Diagnosis present

## 2020-09-14 DIAGNOSIS — I5032 Chronic diastolic (congestive) heart failure: Secondary | ICD-10-CM | POA: Diagnosis present

## 2020-09-14 DIAGNOSIS — Z993 Dependence on wheelchair: Secondary | ICD-10-CM

## 2020-09-14 DIAGNOSIS — Z96642 Presence of left artificial hip joint: Secondary | ICD-10-CM | POA: Diagnosis present

## 2020-09-14 DIAGNOSIS — I482 Chronic atrial fibrillation, unspecified: Secondary | ICD-10-CM | POA: Diagnosis present

## 2020-09-14 DIAGNOSIS — R410 Disorientation, unspecified: Secondary | ICD-10-CM | POA: Diagnosis not present

## 2020-09-14 DIAGNOSIS — Z7189 Other specified counseling: Secondary | ICD-10-CM

## 2020-09-14 DIAGNOSIS — K922 Gastrointestinal hemorrhage, unspecified: Secondary | ICD-10-CM | POA: Diagnosis present

## 2020-09-14 DIAGNOSIS — W19XXXA Unspecified fall, initial encounter: Secondary | ICD-10-CM | POA: Diagnosis not present

## 2020-09-14 DIAGNOSIS — J439 Emphysema, unspecified: Secondary | ICD-10-CM | POA: Diagnosis present

## 2020-09-14 DIAGNOSIS — Z881 Allergy status to other antibiotic agents status: Secondary | ICD-10-CM

## 2020-09-14 DIAGNOSIS — K7460 Unspecified cirrhosis of liver: Secondary | ICD-10-CM | POA: Diagnosis present

## 2020-09-14 DIAGNOSIS — W06XXXA Fall from bed, initial encounter: Secondary | ICD-10-CM | POA: Diagnosis present

## 2020-09-14 DIAGNOSIS — A419 Sepsis, unspecified organism: Secondary | ICD-10-CM | POA: Diagnosis not present

## 2020-09-14 DIAGNOSIS — I714 Abdominal aortic aneurysm, without rupture: Secondary | ICD-10-CM | POA: Diagnosis present

## 2020-09-14 DIAGNOSIS — D638 Anemia in other chronic diseases classified elsewhere: Secondary | ICD-10-CM | POA: Diagnosis present

## 2020-09-14 DIAGNOSIS — D62 Acute posthemorrhagic anemia: Secondary | ICD-10-CM | POA: Diagnosis present

## 2020-09-14 DIAGNOSIS — Z515 Encounter for palliative care: Secondary | ICD-10-CM | POA: Diagnosis not present

## 2020-09-14 DIAGNOSIS — E876 Hypokalemia: Secondary | ICD-10-CM | POA: Diagnosis not present

## 2020-09-14 DIAGNOSIS — I959 Hypotension, unspecified: Secondary | ICD-10-CM

## 2020-09-14 DIAGNOSIS — Y92009 Unspecified place in unspecified non-institutional (private) residence as the place of occurrence of the external cause: Secondary | ICD-10-CM

## 2020-09-14 DIAGNOSIS — I080 Rheumatic disorders of both mitral and aortic valves: Secondary | ICD-10-CM | POA: Diagnosis present

## 2020-09-14 DIAGNOSIS — S12030A Displaced posterior arch fracture of first cervical vertebra, initial encounter for closed fracture: Secondary | ICD-10-CM | POA: Diagnosis present

## 2020-09-14 DIAGNOSIS — Z885 Allergy status to narcotic agent status: Secondary | ICD-10-CM

## 2020-09-14 DIAGNOSIS — I2510 Atherosclerotic heart disease of native coronary artery without angina pectoris: Secondary | ICD-10-CM | POA: Diagnosis present

## 2020-09-14 DIAGNOSIS — Z809 Family history of malignant neoplasm, unspecified: Secondary | ICD-10-CM

## 2020-09-14 DIAGNOSIS — N189 Chronic kidney disease, unspecified: Secondary | ICD-10-CM | POA: Diagnosis present

## 2020-09-14 DIAGNOSIS — R6889 Other general symptoms and signs: Secondary | ICD-10-CM | POA: Diagnosis not present

## 2020-09-14 DIAGNOSIS — Z886 Allergy status to analgesic agent status: Secondary | ICD-10-CM

## 2020-09-14 DIAGNOSIS — Z8249 Family history of ischemic heart disease and other diseases of the circulatory system: Secondary | ICD-10-CM

## 2020-09-14 DIAGNOSIS — E785 Hyperlipidemia, unspecified: Secondary | ICD-10-CM | POA: Diagnosis present

## 2020-09-14 DIAGNOSIS — Z7952 Long term (current) use of systemic steroids: Secondary | ICD-10-CM

## 2020-09-14 DIAGNOSIS — Z87891 Personal history of nicotine dependence: Secondary | ICD-10-CM

## 2020-09-14 DIAGNOSIS — D72829 Elevated white blood cell count, unspecified: Secondary | ICD-10-CM | POA: Diagnosis present

## 2020-09-14 DIAGNOSIS — G9341 Metabolic encephalopathy: Secondary | ICD-10-CM | POA: Diagnosis present

## 2020-09-14 DIAGNOSIS — Z91013 Allergy to seafood: Secondary | ICD-10-CM

## 2020-09-14 DIAGNOSIS — Z951 Presence of aortocoronary bypass graft: Secondary | ICD-10-CM

## 2020-09-14 DIAGNOSIS — L03119 Cellulitis of unspecified part of limb: Secondary | ICD-10-CM | POA: Diagnosis present

## 2020-09-14 DIAGNOSIS — I6529 Occlusion and stenosis of unspecified carotid artery: Secondary | ICD-10-CM | POA: Diagnosis present

## 2020-09-14 DIAGNOSIS — R451 Restlessness and agitation: Secondary | ICD-10-CM | POA: Diagnosis not present

## 2020-09-14 DIAGNOSIS — R4587 Impulsiveness: Secondary | ICD-10-CM | POA: Diagnosis not present

## 2020-09-14 DIAGNOSIS — Z781 Physical restraint status: Secondary | ICD-10-CM

## 2020-09-14 DIAGNOSIS — Z66 Do not resuscitate: Secondary | ICD-10-CM | POA: Diagnosis not present

## 2020-09-14 DIAGNOSIS — Z8719 Personal history of other diseases of the digestive system: Secondary | ICD-10-CM

## 2020-09-14 DIAGNOSIS — S12000A Unspecified displaced fracture of first cervical vertebra, initial encounter for closed fracture: Secondary | ICD-10-CM | POA: Diagnosis present

## 2020-09-14 DIAGNOSIS — I130 Hypertensive heart and chronic kidney disease with heart failure and stage 1 through stage 4 chronic kidney disease, or unspecified chronic kidney disease: Secondary | ICD-10-CM | POA: Diagnosis present

## 2020-09-14 DIAGNOSIS — Z743 Need for continuous supervision: Secondary | ICD-10-CM | POA: Diagnosis not present

## 2020-09-14 DIAGNOSIS — S12031A Nondisplaced posterior arch fracture of first cervical vertebra, initial encounter for closed fracture: Secondary | ICD-10-CM | POA: Diagnosis not present

## 2020-09-14 DIAGNOSIS — Z7901 Long term (current) use of anticoagulants: Secondary | ICD-10-CM

## 2020-09-14 DIAGNOSIS — F1010 Alcohol abuse, uncomplicated: Secondary | ICD-10-CM | POA: Diagnosis present

## 2020-09-14 DIAGNOSIS — Z6825 Body mass index (BMI) 25.0-25.9, adult: Secondary | ICD-10-CM

## 2020-09-14 DIAGNOSIS — D649 Anemia, unspecified: Secondary | ICD-10-CM

## 2020-09-14 DIAGNOSIS — Z79899 Other long term (current) drug therapy: Secondary | ICD-10-CM

## 2020-09-14 DIAGNOSIS — Z888 Allergy status to other drugs, medicaments and biological substances status: Secondary | ICD-10-CM

## 2020-09-14 LAB — I-STAT CHEM 8, ED
BUN: 19 mg/dL (ref 8–23)
Calcium, Ion: 1.05 mmol/L — ABNORMAL LOW (ref 1.15–1.40)
Chloride: 102 mmol/L (ref 98–111)
Creatinine, Ser: 1.7 mg/dL — ABNORMAL HIGH (ref 0.61–1.24)
Glucose, Bld: 112 mg/dL — ABNORMAL HIGH (ref 70–99)
HCT: 21 % — ABNORMAL LOW (ref 39.0–52.0)
Hemoglobin: 7.1 g/dL — ABNORMAL LOW (ref 13.0–17.0)
Potassium: 3.2 mmol/L — ABNORMAL LOW (ref 3.5–5.1)
Sodium: 137 mmol/L (ref 135–145)
TCO2: 20 mmol/L — ABNORMAL LOW (ref 22–32)

## 2020-09-14 LAB — CBC
HCT: 22.9 % — ABNORMAL LOW (ref 39.0–52.0)
HCT: 23.8 % — ABNORMAL LOW (ref 39.0–52.0)
Hemoglobin: 6.7 g/dL — CL (ref 13.0–17.0)
Hemoglobin: 7.3 g/dL — ABNORMAL LOW (ref 13.0–17.0)
MCH: 23.9 pg — ABNORMAL LOW (ref 26.0–34.0)
MCH: 24.7 pg — ABNORMAL LOW (ref 26.0–34.0)
MCHC: 29.3 g/dL — ABNORMAL LOW (ref 30.0–36.0)
MCHC: 30.7 g/dL (ref 30.0–36.0)
MCV: 81.8 fL (ref 80.0–100.0)
MCV: 80.7 fL (ref 80.0–100.0)
Platelets: 343 10*3/uL (ref 150–400)
Platelets: 330 10*3/uL (ref 150–400)
RBC: 2.8 MIL/uL — ABNORMAL LOW (ref 4.22–5.81)
RBC: 2.95 MIL/uL — ABNORMAL LOW (ref 4.22–5.81)
RDW: 20.2 % — ABNORMAL HIGH (ref 11.5–15.5)
RDW: 19.5 % — ABNORMAL HIGH (ref 11.5–15.5)
WBC: 39.9 10*3/uL — ABNORMAL HIGH (ref 4.0–10.5)
WBC: 34.1 10*3/uL — ABNORMAL HIGH (ref 4.0–10.5)
nRBC: 0 % (ref 0.0–0.2)
nRBC: 0 % (ref 0.0–0.2)

## 2020-09-14 LAB — COMPREHENSIVE METABOLIC PANEL
ALT: 14 U/L (ref 0–44)
AST: 30 U/L (ref 15–41)
Albumin: 1.8 g/dL — ABNORMAL LOW (ref 3.5–5.0)
Alkaline Phosphatase: 118 U/L (ref 38–126)
Anion gap: 11 (ref 5–15)
BUN: 19 mg/dL (ref 8–23)
CO2: 21 mmol/L — ABNORMAL LOW (ref 22–32)
Calcium: 7.8 mg/dL — ABNORMAL LOW (ref 8.9–10.3)
Chloride: 103 mmol/L (ref 98–111)
Creatinine, Ser: 1.79 mg/dL — ABNORMAL HIGH (ref 0.61–1.24)
GFR, Estimated: 39 mL/min — ABNORMAL LOW (ref >60)
Glucose, Bld: 116 mg/dL — ABNORMAL HIGH (ref 70–99)
Potassium: 3.2 mmol/L — ABNORMAL LOW (ref 3.5–5.1)
Sodium: 135 mmol/L (ref 135–145)
Total Bilirubin: 0.8 mg/dL (ref 0.3–1.2)
Total Protein: 6 g/dL — ABNORMAL LOW (ref 6.5–8.1)

## 2020-09-14 LAB — SAMPLE TO BLOOD BANK

## 2020-09-14 LAB — DIFFERENTIAL
Abs Immature Granulocytes: 0.35 10*3/uL — ABNORMAL HIGH (ref 0.00–0.07)
Basophils Absolute: 0.1 10*3/uL (ref 0.0–0.1)
Basophils Relative: 0 %
Eosinophils Absolute: 0 10*3/uL (ref 0.0–0.5)
Eosinophils Relative: 0 %
Immature Granulocytes: 1 %
Lymphocytes Relative: 3 %
Lymphs Abs: 0.9 10*3/uL (ref 0.7–4.0)
Monocytes Absolute: 1.1 10*3/uL — ABNORMAL HIGH (ref 0.1–1.0)
Monocytes Relative: 3 %
Neutro Abs: 31.7 10*3/uL — ABNORMAL HIGH (ref 1.7–7.7)
Neutrophils Relative %: 93 %

## 2020-09-14 LAB — LACTIC ACID, PLASMA
Lactic Acid, Venous: 2.8 mmol/L (ref 0.5–1.9)
Lactic Acid, Venous: 2.1 mmol/L (ref 0.5–1.9)

## 2020-09-14 LAB — PREPARE RBC (CROSSMATCH)

## 2020-09-14 LAB — PROTIME-INR
INR: 1.4 — ABNORMAL HIGH (ref 0.8–1.2)
Prothrombin Time: 16.7 seconds — ABNORMAL HIGH (ref 11.4–15.2)

## 2020-09-14 LAB — ETHANOL: Alcohol, Ethyl (B): <10 mg/dL (ref <10)

## 2020-09-14 LAB — SAVE SMEAR(SSMR), FOR PROVIDER SLIDE REVIEW

## 2020-09-14 LAB — POC OCCULT BLOOD, ED: Fecal Occult Bld: POSITIVE — AB

## 2020-09-14 LAB — RESP PANEL BY RT-PCR (FLU A&B, COVID) ARPGX2
Influenza A by PCR: NEGATIVE
Influenza B by PCR: NEGATIVE
SARS Coronavirus 2 by RT PCR: POSITIVE — AB

## 2020-09-14 LAB — BRAIN NATRIURETIC PEPTIDE: B Natriuretic Peptide: 1149 pg/mL — ABNORMAL HIGH (ref 0.0–100.0)

## 2020-09-14 LAB — LACTATE DEHYDROGENASE: LDH: 193 U/L — ABNORMAL HIGH (ref 98–192)

## 2020-09-14 LAB — APTT: aPTT: 32 seconds (ref 24–36)

## 2020-09-14 MED ORDER — SODIUM CHLORIDE 0.9 % IV SOLN
2.0000 g | Freq: Once | INTRAVENOUS | Status: AC
  Administered 2020-09-14: 2 g via INTRAVENOUS
  Filled 2020-09-14: qty 2

## 2020-09-14 MED ORDER — VANCOMYCIN HCL 1000 MG/200ML IV SOLN
1000.0000 mg | INTRAVENOUS | Status: DC
  Administered 2020-09-15: 1000 mg via INTRAVENOUS
  Filled 2020-09-14 (×2): qty 200

## 2020-09-14 MED ORDER — VANCOMYCIN HCL 1500 MG/300ML IV SOLN
1500.0000 mg | Freq: Once | INTRAVENOUS | Status: AC
  Administered 2020-09-14: 1500 mg via INTRAVENOUS
  Filled 2020-09-14: qty 300

## 2020-09-14 MED ORDER — FENTANYL CITRATE (PF) 100 MCG/2ML IJ SOLN
25.0000 ug | INTRAMUSCULAR | Status: DC | PRN
  Administered 2020-09-14 – 2020-09-17 (×8): 25 ug via INTRAVENOUS
  Filled 2020-09-14 (×8): qty 2

## 2020-09-14 MED ORDER — ONDANSETRON HCL 4 MG PO TABS: 4.0000 mg | ORAL_TABLET | Freq: Four times a day (QID) | ORAL | Status: DC | PRN

## 2020-09-14 MED ORDER — ONDANSETRON HCL 4 MG/2ML IJ SOLN: 4.0000 mg | Freq: Four times a day (QID) | INTRAMUSCULAR | Status: DC | PRN

## 2020-09-14 MED ORDER — FENTANYL CITRATE (PF) 100 MCG/2ML IJ SOLN
50.0000 ug | Freq: Once | INTRAMUSCULAR | Status: AC
Start: 2020-09-14 — End: 2020-09-14
  Administered 2020-09-14: 50 ug via INTRAVENOUS
  Filled 2020-09-14: qty 2

## 2020-09-14 MED ORDER — METRONIDAZOLE 500 MG/100ML IV SOLN
500.0000 mg | Freq: Once | INTRAVENOUS | Status: AC
  Administered 2020-09-14: 500 mg via INTRAVENOUS
  Filled 2020-09-14: qty 100

## 2020-09-14 MED ORDER — SODIUM CHLORIDE 0.9% IV SOLUTION: Freq: Once | INTRAVENOUS | Status: AC

## 2020-09-14 MED ORDER — SODIUM CHLORIDE 0.9% FLUSH
3.0000 mL | Freq: Two times a day (BID) | INTRAVENOUS | Status: DC
  Administered 2020-09-14 – 2020-09-19 (×9): 3 mL via INTRAVENOUS

## 2020-09-14 MED ORDER — OXYCODONE HCL 5 MG PO TABS
15.0000 mg | ORAL_TABLET | ORAL | Status: DC | PRN
Start: 2020-09-14 — End: 2020-09-15
  Administered 2020-09-14 – 2020-09-15 (×3): 15 mg via ORAL
  Filled 2020-09-14 (×3): qty 3

## 2020-09-14 MED ORDER — POTASSIUM CHLORIDE CRYS ER 20 MEQ PO TBCR
40.0000 meq | EXTENDED_RELEASE_TABLET | ORAL | Status: AC
  Administered 2020-09-14: 40 meq via ORAL
  Filled 2020-09-14: qty 2

## 2020-09-14 MED ORDER — LACTATED RINGERS IV SOLN
INTRAVENOUS | Status: DC
  Administered 2020-09-14: 1000 mL via INTRAVENOUS

## 2020-09-14 MED ORDER — SODIUM CHLORIDE 0.9 % IV SOLN
2.0000 g | Freq: Two times a day (BID) | INTRAVENOUS | Status: DC
  Administered 2020-09-15 (×3): 2 g via INTRAVENOUS
  Filled 2020-09-14 (×3): qty 2

## 2020-09-14 MED ORDER — DULOXETINE HCL 60 MG PO CPEP
60.0000 mg | ORAL_CAPSULE | Freq: Every day | ORAL | Status: DC
  Administered 2020-09-15 – 2020-09-17 (×3): 60 mg via ORAL
  Filled 2020-09-14 (×4): qty 1

## 2020-09-14 MED ORDER — ALBUTEROL SULFATE HFA 108 (90 BASE) MCG/ACT IN AERS
1.0000 | INHALATION_SPRAY | Freq: Four times a day (QID) | RESPIRATORY_TRACT | Status: DC | PRN
  Filled 2020-09-14: qty 6.7

## 2020-09-14 MED ORDER — OXYCODONE HCL ER 10 MG PO T12A
10.0000 mg | EXTENDED_RELEASE_TABLET | Freq: Two times a day (BID) | ORAL | Status: DC
Start: 2020-09-14 — End: 2020-09-15
  Administered 2020-09-14 – 2020-09-15 (×2): 10 mg via ORAL
  Filled 2020-09-14 (×2): qty 1

## 2020-09-14 MED ORDER — LACTATED RINGERS IV SOLN: INTRAVENOUS | Status: DC

## 2020-09-14 MED ORDER — SODIUM CHLORIDE 0.9 % IV SOLN
10.0000 mL/h | Freq: Once | INTRAVENOUS | Status: AC
  Administered 2020-09-14: 10 mL/h via INTRAVENOUS

## 2020-09-14 NOTE — ED Notes (Signed)
Pt transported to CT at this time.

## 2020-09-14 NOTE — ED Notes (Signed)
Attempt report x1  

## 2020-09-14 NOTE — ED Notes (Signed)
Blood consent obtained electronically

## 2020-09-14 NOTE — ED Notes (Signed)
Attempt report x 2

## 2020-09-14 NOTE — ED Notes (Signed)
C-collar applied

## 2020-09-14 NOTE — Consult Note (Addendum)
Reason for Consult: C1 fracture Referring Physician: Margette Fast, MD   HPI: [name] is a 80 y.o. male with a PmHx significant for CAD, CHF, COPD, and anticoagulation (Eliquis) presented to the emergency department today due to complaints of AMS and fall. Patient was reported to live at home with his son and daughter-in-law.  EMS reported they initially responded with concern for AMS, but found the patient to be hypotensive with SBP in the 80s. On arrival to the ED the patient     Past Medical History:  Diagnosis Date   AAA (abdominal aortic aneurysm) ([place])    Blindness of left eye    BPH (benign prostatic hypertrophy) with urinary obstruction 07/2013   CAD (coronary artery disease)    a. 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/2013   Peripheral vascular disease ([name])    TOBACCO USE, QUIT     Past Surgical History:  Procedure Laterality Date   CORONARY ARTERY BYPASS GRAFT  11/03/2008   Ricard Dillon - x4: left internal mammary artery to the distal left anterior descending, saphemous vein graft to the first circumflex marginal branch with a Y graft sequentiallly to a left posterolateral branch, saphenous vein graft to the distal right coronary artery   ELECTROPHYSIOLOGIC STUDY N/A 01/18/2015   Procedure: A-Flutter Ablation;  Surgeon: Deboraha Sprang, MD;  Location: [facility];  Service: Cardiovascular;  Laterality: N/A;   GREEN LIGHT LASER TURP (TRANSURETHRAL RESECTION OF PROSTATE N/A 02/14/2014   Procedure: GREEN LIGHT  LASER TURP (TRANSURETHRAL RESECTION OF PROSTATE;  Surgeon: Festus Aloe, MD;  Location: [facility];  Service: Urology;  Laterality: N/A;   HIP ARTHROPLASTY Left 01/26/2018   Procedure: LEFT HIP POSTERIOR HEMIARTHROPLASTY;  Surgeon: [name], MD;  Location: [facility];  Service: Orthopedics;  Laterality: Left;   HIP CLOSED REDUCTION Left 09/18/2018   Procedure: CLOSED MANIPULATION HIP;  Surgeon: Justice Britain, MD;  Location: [facility];  Service: Orthopedics;  Laterality: Left;   HIP CLOSED REDUCTION Left 10/08/2018   Procedure: CLOSED MANIPULATION HIP;  Surgeon: Rod Can, MD;  Location: [facility];  Service: Orthopedics;  Laterality: Left;   TOTAL HIP REVISION Left 06/24/2018   Procedure: ACETABULAR HIP REVISION POSTERIOR;  Surgeon: [name], MD;  Location: [facility];  Service: Orthopedics;  Laterality: Left;   TOTAL HIP REVISION Left 10/21/2018   Procedure: POSTERIOR REVISION HIP WITH POSSIBLE CONSTRAINED LINER;  Surgeon: [name], MD;  Location: [facility];  Service: Orthopedics;  Laterality: Left;    Family History  Problem Relation Age of Onset   Cirrhosis Mother        died at 34   Heart attack Father    Other Brother        GSW   Other Brother        died in house fire   Cancer Brother        unsure of type Believes colon or prostate/fim   Colon cancer Neg Hx  Stomach cancer Neg Hx     Social History:  reports that he has quit smoking. He has never used smokeless tobacco. He reports that he does not drink alcohol and does not use drugs.  Allergies:  Allergies  Allergen Reactions   Doxycycline    Gabapentin Other (See Comments)    Pt states make him go down to the floor and can not get up   Amlodipine Nausea And Vomiting and Other (See Comments)    dizziness   Fish Allergy Nausea And Vomiting    STATES HE HAS NOT EATEN ANY FISH INCLUDING SHELLFISH FOR PAST 40 YRS - IT CAUSED NAUSEA   Hydrocodone Itching and Rash   Other Nausea And Vomiting    STATES HE HAS NOT EATEN ANY  FISH INCLUDING SHELLFISH FOR PAST 40 YRS - IT CAUSED NAUSEA   Tylenol [Acetaminophen] Other (See Comments)    Liver problems    Medications: I have reviewed the patient's current medications.  Results for orders placed or performed during the hospital encounter of 09/14/20 (from the past 48 hour(s))  Comprehensive metabolic panel     Status: Abnormal   Collection Time: 09/14/20  8:30 AM  Result Value Ref Range   Sodium 135 135 - 145 mmol/L   Potassium 3.2 (L) 3.5 - 5.1 mmol/L   Chloride 103 98 - 111 mmol/L   CO2 21 (L) 22 - 32 mmol/L   Glucose, Bld 116 (H) 70 - 99 mg/dL    Comment: Glucose reference range applies only to samples taken after fasting for at least 8 hours.   BUN 19 8 - 23 mg/dL   Creatinine, Ser 1.79 (H) 0.61 - 1.24 mg/dL   Calcium 7.8 (L) 8.9 - 10.3 mg/dL   Total Protein 6.0 (L) 6.5 - 8.1 g/dL   Albumin 1.8 (L) 3.5 - 5.0 g/dL   AST 30 15 - 41 U/L   ALT 14 0 - 44 U/L   Alkaline Phosphatase 118 38 - 126 U/L   Total Bilirubin 0.8 0.3 - 1.2 mg/dL   GFR, Estimated 39 (L) >60 mL/min    Comment: (NOTE) Calculated using the CKD-EPI Creatinine Equation (2021)    Anion gap 11 5 - 15    Comment: Performed at [facility], [place] [address] [place], [place] [address]  CBC     Status: Abnormal   Collection Time: 09/14/20  8:30 AM  Result Value Ref Range   WBC 39.9 (H) 4.0 - 10.5 K/uL   RBC 2.80 (L) 4.22 - 5.81 MIL/uL   Hemoglobin 6.7 (LL) 13.0 - 17.0 g/dL    Comment: REPEATED TO VERIFY THIS CRITICAL RESULT HAS VERIFIED AND BEEN CALLED TO E DIXON RN BY PEGGY GRAHAM ON 05 27 2022 AT 0934, AND HAS BEEN READ BACK.     HCT 22.9 (L) 39.0 - 52.0 %   MCV 81.8 80.0 - 100.0 fL   MCH 23.9 (L) 26.0 - 34.0 pg   MCHC 29.3 (L) 30.0 - 36.0 g/dL   RDW 20.2 (H) 11.5 - 15.5 %   Platelets 343 150 - 400 K/uL   nRBC 0.0 0.0 - 0.2 %    Comment: Performed at [facility] [address], [place], [place] [address]  Ethanol     Status: None   Collection Time: 09/14/20  8:30 AM   Result Value Ref Range   Alcohol, Ethyl (B) <10 <10 mg/dL    Comment: (NOTE) Lowest detectable limit for serum alcohol is 10  mg/dL.  For medical purposes only. Performed at [facility], [place] [address] [place], [place] [address]   Lactic acid, plasma     Status: Abnormal   Collection Time: 09/14/20  8:30 AM  Result Value Ref Range   Lactic Acid, Venous 2.8 (HH) 0.5 - 1.9 mmol/L    Comment: CRITICAL RESULT CALLED TO, READ BACK BY AND VERIFIED WITH: DIXON,E RN @ [phone] 09/14/20 LEONARD,A Performed at [facility], [place] [address] [place], [place] [address]   Protime-INR     Status: Abnormal   Collection Time: 09/14/20  8:30 AM  Result Value Ref Range   Prothrombin Time 16.7 (H) 11.4 - 15.2 seconds   INR 1.4 (H) 0.8 - 1.2    Comment: (NOTE) INR goal varies based on device and disease states. Performed at [facility], [place] [address] [place], [place] [address]   Sample to Blood Bank     Status: None   Collection Time: 09/14/20  8:30 AM  Result Value Ref Range   Blood Bank Specimen SAMPLE AVAILABLE FOR TESTING    Sample Expiration      09/15/2020,2359 Performed at [facility], [place] [address], [place], [place] [address]   APTT     Status: None   Collection Time: 09/14/20  8:30 AM  Result Value Ref Range   aPTT 32 24 - 36 seconds    Comment: Performed at [facility] [address] [place], [place] [address]  Type and screen [place]     Status: None (Preliminary result)   Collection Time: 09/14/20  8:30 AM  Result Value Ref Range   ABO/RH(D) A NEG    Antibody Screen NEG    Sample Expiration 09/17/2020,2359    Unit Number [id number]    Blood Component Type RED CELLS,LR    Unit division 00    Status of Unit ISSUED    Transfusion Status OK TO TRANSFUSE    Crossmatch Result      Compatible Performed at [facility], [place] [address], [place], [place] [address]   I-Stat Chem 8, ED     Status: Abnormal    Collection Time: 09/14/20  8:51 AM  Result Value Ref Range   Sodium 137 135 - 145 mmol/L   Potassium 3.2 (L) 3.5 - 5.1 mmol/L   Chloride 102 98 - 111 mmol/L   BUN 19 8 - 23 mg/dL   Creatinine, Ser 1.70 (H) 0.61 - 1.24 mg/dL   Glucose, Bld 112 (H) 70 - 99 mg/dL    Comment: Glucose reference range applies only to samples taken after fasting for at least 8 hours.   Calcium, Ion 1.05 (L) 1.15 - 1.40 mmol/L   TCO2 20 (L) 22 - 32 mmol/L   Hemoglobin 7.1 (L) 13.0 - 17.0 g/dL   HCT 21.0 (L) 39.0 - 52.0 %  POC occult blood, ED     Status: Abnormal   Collection Time: 09/14/20  8:56 AM  Result Value Ref Range   Fecal Occult Bld POSITIVE (A) NEGATIVE  Resp Panel by RT-PCR (Flu A&B, Covid) Nasopharyngeal Swab     Status: Abnormal   Collection Time: 09/14/20  9:21 AM   Specimen: Nasopharyngeal Swab; Nasopharyngeal(NP) swabs in vial transport medium  Result Value Ref Range   SARS Coronavirus 2 by RT PCR POSITIVE (A) NEGATIVE    Comment: RESULT CALLED TO, READ BACK BY AND VERIFIED WITH: RN Malcolm Metro [phone] AT  1147 AM BY CM (NOTE) SARS-CoV-2 target nucleic acids are DETECTED.  The SARS-CoV-2 RNA is generally detectable in upper respiratory specimens during the acute phase of infection. Positive results are indicative of the presence of the identified virus, but do not rule out bacterial infection or co-infection with other pathogens not detected by the test. Clinical correlation with patient history and other diagnostic information is necessary to determine patient infection status. The expected result is Negative.  Fact Sheet for Patients: EntrepreneurPulse.com.au  Fact Sheet for Healthcare Providers: IncredibleEmployment.be  This test is not yet approved or cleared by the [place] FDA and  has been authorized for detection and/or diagnosis of SARS-CoV-2 by FDA under an Emergency Use Authorization (EUA).  This EUA will remain in effect (meaning this  test can b e used) for the duration of  the COVID-19 declaration under Section 564(b)(1) of the Act, 21 U.S.C. section 360bbb-3(b)(1), unless the authorization is terminated or revoked sooner.     Influenza A by PCR NEGATIVE NEGATIVE   Influenza B by PCR NEGATIVE NEGATIVE    Comment: (NOTE) The Xpert Xpress SARS-CoV-2/FLU/RSV plus assay is intended as an aid in the diagnosis of influenza from Nasopharyngeal swab specimens and should not be used as a sole basis for treatment. Nasal washings and aspirates are unacceptable for Xpert Xpress SARS-CoV-2/FLU/RSV testing.  Fact Sheet for Patients: EntrepreneurPulse.com.au  Fact Sheet for Healthcare Providers: IncredibleEmployment.be  This test is not yet approved or cleared by the [place] FDA and has been authorized for detection and/or diagnosis of SARS-CoV-2 by FDA under an Emergency Use Authorization (EUA). This EUA will remain in effect (meaning this test can be used) for the duration of the COVID-19 declaration under Section 564(b)(1) of the Act, 21 U.S.C. section 360bbb-3(b)(1), unless the authorization is terminated or revoked.  Performed at [facility], [place] [address] [place], [place] [address]   Prepare RBC (crossmatch)     Status: None   Collection Time: 09/14/20 10:03 AM  Result Value Ref Range   Order Confirmation      ORDER PROCESSED BY BLOOD BANK Performed at [facility], [place] [address] [place], [place] [address]   Brain natriuretic peptide     Status: Abnormal   Collection Time: 09/14/20 10:03 AM  Result Value Ref Range   B Natriuretic Peptide 1,149.0 (H) 0.0 - 100.0 pg/mL    Comment: Performed at [facility] [address], [place],  [address]   *Note: Due to a large number of results and/or encounters for the requested time period, some results have not been displayed. A complete set of results can be found in Results Review.    CT Head Wo  Contrast  Result Date: 09/14/2020 CLINICAL DATA:  Fall from bed, altered mental status, trauma EXAM: CT HEAD WITHOUT CONTRAST CT CERVICAL SPINE WITHOUT CONTRAST TECHNIQUE: Multidetector CT imaging of the head and cervical spine was performed following the standard protocol without intravenous contrast. Multiplanar CT image reconstructions of the cervical spine were also generated. COMPARISON:  04/03/2012 FINDINGS: CT HEAD FINDINGS Brain: Mild atrophy pattern and white matter microvascular ischemic changes throughout both cerebral hemispheres. No acute intracranial hemorrhage, mass lesion, new infarction, midline shift, herniation, hydrocephalus, or extra-axial fluid collection. No focal mass effect or edema. Cisterns are patent. Cerebellar atrophy as well. Vascular: No hyperdense vessel or unexpected calcification. Skull: Normal. Negative for fracture or focal lesion. Sinuses/Orbits: No acute finding. Other: None. CT CERVICAL SPINE FINDINGS Alignment: Normal. Skull base  and vertebrae: There are acute bilateral nondisplaced fractures of the C1 posterior arch posteriorly, images 20 through 23 of series 5. Anterior arch of C1 appears intact. [place] axial alignment remains intact. Progressive erosive degenerative arthropathy of the C1-2 articulation, often seen with rheumatoid arthritis or CPPD. Odontoid remains intact. Remainder of the cervical spine demonstrates no acute osseous finding. Soft tissues and spinal canal: No prevertebral fluid or swelling. No visible canal hematoma. Disc levels: Large anterior bulky osteophytes throughout the cervical spine as before. Advanced multilevel degenerative change throughout the C-spine with disc space narrowing, endplate sclerosis and osteophyte formation. Only minor facet arthropathy posteriorly. Preserved vertebral body heights. No focal kyphosis. Upper chest: Mild apical emphysema. Aorta atherosclerotic. Subclavian carotid atherosclerosis also noted. Other: None.  IMPRESSION: No acute intracranial abnormality. Acute nondisplaced fractures of the C1 posterior arch bilaterally. Anterior arch of C1 is intact. Progressive erosive arthropathy at C1-2 articulation as seen with rheumatoid arthritis or CPPD. Similar advanced multilevel degenerative changes throughout the cervical spine as detailed above. These results were called by telephone at the time of interpretation on 09/14/2020 at 9:57 am to provider JOSHUA LONG , who verbally acknowledged these results. Electronically Signed   By: Jerilynn Mages.  Shick M.D.   On: 09/14/2020 09:59   CT Cervical Spine Wo Contrast  Result Date: 09/14/2020 CLINICAL DATA:  Fall from bed, altered mental status, trauma EXAM: CT HEAD WITHOUT CONTRAST CT CERVICAL SPINE WITHOUT CONTRAST TECHNIQUE: Multidetector CT imaging of the head and cervical spine was performed following the standard protocol without intravenous contrast. Multiplanar CT image reconstructions of the cervical spine were also generated. COMPARISON:  04/03/2012 FINDINGS: CT HEAD FINDINGS Brain: Mild atrophy pattern and white matter microvascular ischemic changes throughout both cerebral hemispheres. No acute intracranial hemorrhage, mass lesion, new infarction, midline shift, herniation, hydrocephalus, or extra-axial fluid collection. No focal mass effect or edema. Cisterns are patent. Cerebellar atrophy as well. Vascular: No hyperdense vessel or unexpected calcification. Skull: Normal. Negative for fracture or focal lesion. Sinuses/Orbits: No acute finding. Other: None. CT CERVICAL SPINE FINDINGS Alignment: Normal. Skull base and vertebrae: There are acute bilateral nondisplaced fractures of the C1 posterior arch posteriorly, images 20 through 23 of series 5. Anterior arch of C1 appears intact. [place] axial alignment remains intact. Progressive erosive degenerative arthropathy of the C1-2 articulation, often seen with rheumatoid arthritis or CPPD. Odontoid remains intact. Remainder of the  cervical spine demonstrates no acute osseous finding. Soft tissues and spinal canal: No prevertebral fluid or swelling. No visible canal hematoma. Disc levels: Large anterior bulky osteophytes throughout the cervical spine as before. Advanced multilevel degenerative change throughout the C-spine with disc space narrowing, endplate sclerosis and osteophyte formation. Only minor facet arthropathy posteriorly. Preserved vertebral body heights. No focal kyphosis. Upper chest: Mild apical emphysema. Aorta atherosclerotic. Subclavian carotid atherosclerosis also noted. Other: None. IMPRESSION: No acute intracranial abnormality. Acute nondisplaced fractures of the C1 posterior arch bilaterally. Anterior arch of C1 is intact. Progressive erosive arthropathy at C1-2 articulation as seen with rheumatoid arthritis or CPPD. Similar advanced multilevel degenerative changes throughout the cervical spine as detailed above. These results were called by telephone at the time of interpretation on 09/14/2020 at 9:57 am to provider JOSHUA LONG , who verbally acknowledged these results. Electronically Signed   By: Jerilynn Mages.  Shick M.D.   On: 09/14/2020 09:59   DG Chest Portable 1 View  Result Date: 09/14/2020 CLINICAL DATA:  Fall from bed. EXAM: PORTABLE CHEST 1 VIEW COMPARISON:  08/09/2019. FINDINGS: Normal heart size and mediastinal contours.  CABG. No acute infiltrate or edema. No effusion or pneumothorax. No acute osseous findings. Artifact from EKG leads. IMPRESSION: No active disease. Electronically Signed   By: Monte Fantasia M.D.   On: 09/14/2020 09:30    Review of Systems  Unable to perform ROS: Mental status change   Blood pressure (!) 106/51, pulse 89, temperature 97.7 F (36.5 C), temperature source Oral, resp. rate 20, height 5\' 10"  (1.778 m), weight 79.4 kg, SpO2 100 %.  Physical Exam: Constitutional: Alert with some confusion noted. No acute distress.  Eyes: Conjunctivae are normal. PERRL. Head: Atraumatic. Nose:  No congestion/rhinnorhea. Mouth/Throat: Mucous membranes are moist.   Neck: No stridor.   Cardiovascular: Normal rate, regular rhythm. Good peripheral circulation. Grossly normal heart sounds.   Respiratory: Normal respiratory effort.  No retractions. Lungs CTAB. Gastrointestinal: Soft and nontender. No distention. Rectal exam: Owens Shark stool which is hemoccult positive. No gross blood or melena.  Musculoskeletal: No lower extremity tenderness nor edema. No gross deformities of extremities. Neurologic:  Normal speech and language. No gross focal neurologic deficits are appreciated.  Skin:  Skin is warm, dry and intact. No rash noted.  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  He is neurologically intact. Currently not in need of surgical intervention. Would recommend cervical spine MRI to assess for any potential lower brainstem and upper spinal cord compression due to the basilar impression. If the patient becomes symptomatic, surgical intervention may be necessary. Continue hard cervical collar. Will need outpatient follow up 1 week after discharge with cervical radiographs.   Marvis Moeller, DNP, NP-C 09/14/2020, 3:48 PM

## 2020-09-14 NOTE — Progress Notes (Signed)
[name] is a current hospice patient with a terminal diagnosis of hypertensive heart and chronic kidney disease with heart failure. Daughter called hospice to report patient had been up all night "hollering and having episodes" and had fallen out of the bed a few times. Daughter reported to [facility] RN she was unsure which medications patient may have taken, pill bottles were open, scattered on the floor with tops off and that cola had been spilled on meds or it could have possibly been urine. Patient requested family call 69. Patient evaluated in the ED and is admitted 09/14/20 for symptomatic anemia, hypotension, closed nondisplaced fracture of posterior arch of first cervical vertebra and Covid 19 infection. Patient is a DNR. Per Dr. Tomasa Hosteller this is a related hospital admission.  Unable to visit patient at bedside due to Covid 19 infection. Phone call to daughter's number in chart, person answered, stated it was a wrong number and disconnected call. Phone call to son in law, voice message left.   V/S: 97.9, 106/51, 89, 18. 100% on room air  Abnormal Lab work: Sodium: 135 Potassium: 3.2 (L) CO2: 21 (L) Glucose: 116 (H) Creatinine: 1.79 (H) Calcium: 7.8 (L) Albumin: 1.8 (L) Total Protein: 6.0 (L) GFR, Estimated: 39 (L) Lactic Acid, Venous: 2.8 (HH) WBC: 39.9 (H) RBC: 2.80 (L) Hemoglobin: 6.7 (LL) HCT: 22.9 (L) MCH: 23.9 (L) MCHC: 29.3 (L) RDW: 20.2 (H) Prothrombin Time: 16.7 (H) INR: 1.4 (H) BNP: 1149 Covid Positive FOB: Positive  New Diagnostics: Spine CT IMPRESSION: No acute intracranial abnormality.  Acute nondisplaced fractures of the C1 posterior arch bilaterally. Anterior arch of C1 is intact.  Progressive erosive arthropathy at C1-2 articulation as seen with rheumatoid arthritis or CPPD.  Similar advanced multilevel degenerative changes throughout the cervical spine as detailed above.  Chest  Xray: IMPRESSION: No active disease.  IV/PRN Meds: ceFEPIme (MAXIPIME) 2 g in sodium chloride 0.9 % 100 mL IVPB Q 12 hous, LR at 75 cc/hr, metroNIDAZOLE (FLAGYL) IVPB 500 mg X 1, vancomycin (VANCOREADY) IVPB 1500 mg/300 mL X 1, Fentanyl 25 mcg X 1 dose.  No H&P at this time for hospitalist note.  GI Consult:     Chronic anemia.  Hgb once again in 6s as was the case in early March when he received 1 PRBC and parenteral iron. Now FOBT positive in setting of daily oral iron.  *    AMS.  Possible overuse of oxycodone.  Improved at time of my exam.    *    COVID-19 positive.  Marked leukocytosis but no active disease on chest x-ray.   *     hypotension, Leukocytosis w/o fever, meets sepsis criteria.  Cefepime, vancomycin initiated. CXR and U/A unremarkable.    *    elevated BNP 1149.  Hx CAD, remote CABG.  Last echo was 06/2018 w LVEF > 65%.  Severe left and right atrial dilation, degenerative mitral valve with mild to moderate mitral regurg.  Mild aortic valve sclerosis.  Dilated IVC with respiratory variability.  Possible PFO. Current CXR without signs of heart failure.  *   Longstanding cirrhosis of the liver, compensated with normal platelets.  INR elevated at 1.4 but he takes Eliquis.  *    atrial fibrillation.  Chronic Eliquis.  Last dose not yet determined but assume it was taken last night 5/26.  *   Low albumin consistent with malnutrition of unclear severity.  Plan: *    At present pt  is to chronically and acutely ill for consideration of endoscopic procedures in setting of no overt GI hemorrhaging.  Okay to eat any texture of food/diet as tolerated.  Ordered a soft/HH diet as he lacks teeth and this will be easier to chew.  Discharge Planning: ongoing and to be determined, working closely with inpatient palliative team for goals of care.  Family Contact: Attempted as above without success.  IDT: updated  Goals of Care: Discussion occurring this afternoon with  inpatient PMT.  Medication List and Transfer Summary placed on patient hard back chart.  Please use [facility] for any transportation needs as hospice contracts this service for our patients.  Please call with any hospice related questions or concerns.  Thank you. Margaretmary Eddy, BSN, RN [facility] Liaison [phone]

## 2020-09-14 NOTE — Plan of Care (Signed)
  Problem: Education: Goal: Knowledge of General Education information will improve Description Including pain rating scale, medication(s)/side effects and non-pharmacologic comfort measures Outcome: Progressing   

## 2020-09-14 NOTE — Consult Note (Signed)
Consultation Note Date: 09/14/2020   Patient Name: [name]  DOB: 1940/05/18  MRN: [id number]  Age / Sex: 80 y.o., male  PCP: [name], MD Referring Physician: Norval Morton, MD  Reason for Consultation: Establishing goals of care  "patient on hospice"  HPI/Patient Profile: 80 y.o. male  with past medical history of COPD, chronic diastolic heart failure, iron deficiency anemia, chronic pain, atrial fibrillation on anticoagulation, CAD s/p CABG, PVD, BPH, and cirrhosis. He presented to the emergency department on 09/14/2020 after EMS was called for altered mental status. The patient's daughter-in-law showed EMS videos of the patient "talking out of his head" last night. EMS reported patient was found naked in his bed with poor hygiene. EMS was also concerned about patient's bottle of oxycodone was filled 5/26 with 90 pills, but only had 40 pills left in the bottle.  ED Course: Patient was alert and oriented x 4 per admitting physician. CT head and cervical spine was significant for nondisplaced fractures of C1 posterior arch bilaterally. Labs significant for WBC 39.9, hemoglobin 6.7, platelet count 343, potassium 3.2, BUN 19, creatinine 1.79, calcium 7.8, albumin 1.8, BNP 1149, and lactic acid 2.8. Chest x-ray showed no acute abnormality. COVID-19 positive. Sepsis protocol was initiated. Patient was transfused 1 unit PRBCs. Admitted to [facility] for sepsis, possible cellulitis, C1 fracture secondary to fall, acute blood loss anemia, and COVID-19 infection.   Patient recently started receiving home hospice services with [facility].   Clinical Assessment and Goals of Care: I have reviewed medical records including EPIC notes, labs and imaging, and discussed case with hospice liaison.   I met at bedside with patient to discuss diagnosis, prognosis, GOC, EOL wishes, disposition, and options. Patient is  alert, oriented, and able to appropriate to make his own decisions.   I introduced Palliative Medicine as specialized medical care for people living with serious illness. It focuses on providing relief from the symptoms and stress of a serious illness.   We discussed a brief life review of the patient. He served in [employer] in the 1960's. After service, he worked for [employer]. With his first wife, he had 1 son - Willson Brooke Bonito.). He later re-married; unfortunately his second wife passed away in 1992-06-05. He reports living in his current home for 46 years. His son and daughter-in-law have lived with him for the past several years. He has an 69 year-year-old grandson who lives with him as well.   As far as functional status, he is non-ambulatory at baseline. He reports his son and daughter-in-law are mostly helpful, and he has no concerns with his care environment at home. Patient does report that he wanted hospice services because of the additional resources and care hospice provides in the home.   We discussed his current illness and what it means in the larger context of his ongoing co-morbidities.  Natural disease trajectory of chronic illness was discussed. Emphasized that he has multiple medical problems, most of which are progressive and will worsen over time.  Patient shares that he has been under pain management services for 11 years. He has chronic pain in his legs, arms, back, and buttocks. He reports that he always takes his pain medication as prescribed. Discussed EMS concern regarding possible diversion. Patient has no concerns that his son or daughter in law are diverting his pain medication.   I attempted to elicit values and goals of care important to the patient. He states "I want to live". His short-term goal is to return home and resume hospice care. His long-term goal is to be [facility] care.   The difference between full scope medical  intervention and comfort care was considered.  I reviewed the concept of a comfort path with patient, emphasizing that this path involves de-escalating and stopping full scope medical interventions, allowing a natural course to occur. Discussed that the goal is comfort and dignity rather than cure/prolonging life. Encouraged patient to consider at what point they would want to stop full scope medical interventions, keeping in mind the concept of quality of life.   For now, patient wants to continue all current medical interventions. We discussed code status at length. Patient states he would want  be resuscitated in the event of cardiopulmonary arrest.  Encouraged patient to consider DNR/DNI status understanding evidenced based poor outcomes in similar hospitalized patients, as the cause of the arrest is likely associated with chronic/terminal disease rather than a reversible acute cardio-pulmonary event. I explained that DNR/DNI does not change the medical plan and it only comes into effect after a person has arrested (died).  It is a protective measure to keep [place] from harming the patient in their last moments of life. Patient is not agreeable to DNR/DNI with understanding that full code includes CPR, defibrillation, ACLS medications, and intubation. He understands  prognosis would be poor in the event of a resuscitation event, but is clear in desire to be full code status.  Primary decision maker: Patient    SUMMARY OF RECOMMENDATIONS    Full code  Continue full scope medical treatment  Short-term goal is to return home and resume hospice care  Long-term goal is placement into [facility] long-term [facility] facility   Home pain medications ordered (see below)  PMT will continue to follow  Code Status/Advance Care Planning:  Full code  Symptom Management:   Oxycontin 10 mg BID  Oxycodone IR 15 mg every 4 hours PRN breakthrough pain  Duloxetine 60 mg daily  Palliative Prophylaxis:   Frequent  Pain Assessment and Turn Reposition  Additional Recommendations (Limitations, Scope, Preferences):  Full Scope Treatment  Psycho-social/Spiritual:   Created space and opportunity for patient to express thoughts and feelings regarding patient's current medical situation.   Emotional support provided   Prognosis:   < 6 months  Discharge Planning: Home with Hospice      Primary Diagnoses: Present on Admission: . Sepsis ([place])   I have reviewed the medical record, interviewed the patient and family, and examined the patient. The following aspects are pertinent.  Past Medical History:  Diagnosis Date  . AAA (abdominal aortic aneurysm) ([place])   . Blindness of left eye   . 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 CAD (coronary artery disease)    a. s/p CABG in 10/2008 with LIMA-LAD, SVG-OM1 with Y-graft to PL branch, and SVG-RCA  . Carotid stenosis   . Cerebrovascular disease   . CHF (congestive heart failure) ([place])   . Cirrhosis ([place])  on CT a/p 07/2013, no longer drinking  . CONGENITAL UNSPEC REDUCTION DEFORMITY LOWER LIMB   . Constipation due to opioid therapy 2014   began about a year ago  . COPD (chronic obstructive pulmonary disease) ([place])   . HTN (hypertension)   . Hyperlipidemia   . 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 Peripheral vascular disease ([place])   . TOBACCO USE, QUIT     Family History  Problem Relation Age of Onset  . Cirrhosis Mother        died at 75  . Heart attack Father   . Other Brother        GSW  . Other Brother        died in house fire  . Cancer Brother        unsure of type Believes colon or prostate/fim  . Colon cancer Neg Hx   . Stomach cancer Neg Hx    Scheduled Meds: Continuous Infusions: . ceFEPime (MAXIPIME) IV    . lactated ringers 125 mL/hr at 09/14/20 0848  . [START ON 09/15/2020] vancomycin     PRN Meds:.fentaNYL (SUBLIMAZE) injection Medications Prior to  Admission:  Prior to Admission medications   Medication Sig Start Date End Date Taking? Authorizing Provider  albuterol (VENTOLIN HFA) 108 (90 Base) MCG/ACT inhaler TAKE 2 PUFFS BY MOUTH EVERY 6 HOURS AS NEEDED FOR WHEEZE OR SHORTNESS OF BREATH 08/15/20   [name], MD  b complex vitamins tablet Take 1 tablet by mouth daily.    [provider]  buprenorphine (BUTRANS) 7.5 MCG/HR Place 1 patch onto the skin once a week. 06/25/20   [provider]  Cholecalciferol (VITAMIN D3 PO) Take by mouth.    [provider]  DULoxetine (CYMBALTA) 30 MG capsule TAKE 1 CAPSULE BY MOUTH EVERY DAY 08/02/20   [name], MD  ELIQUIS 5 MG TABS tablet TAKE 1 TABLET BY MOUTH TWICE A DAY 04/11/20   Lelon Perla, MD  ferrous sulfate 325 (65 FE) MG tablet Take 325 mg by mouth daily with breakfast.    [provider]  finasteride (PROSCAR) 5 MG tablet TAKE 1 TABLET BY MOUTH EVERY DAY 07/10/20   [name], MD  fluticasone [facility]) 50 MCG/ACT nasal spray PLACE 1 SPRAY INTO BOTH NOSTRILS EVERY MORNING. 10/25/19   [name], MD  furosemide (LASIX) 80 MG tablet TAKE 1 TABLET BY MOUTH EVERY DAY 09/03/20   [name], MD  Guaifenesin 1200 MG TB12 Take 1 tablet (1,200 mg total) by mouth 2 (two) times daily. Patient taking differently: Take 1,200 mg by mouth 2 (two) times daily as needed (mucus). 02/15/18   [name], MD  KLOR-CON M20 20 MEQ tablet TAKE 1 TABLET BY MOUTH EVERY DAY 09/03/20   Lelon Perla, MD  lactulose (CHRONULAC) 10 GM/15ML solution TAKE 30 MLS BY MOUTH 2 TIMES DAILY AS NEEDED FOR MILD CONSTIPATION OR MODERATE CONSTIPATION Patient taking differently: Take 20 g by mouth 2 (two) times daily as needed for mild constipation. 06/01/18   [name], MD  lidocaine (XYLOCAINE) 5 % ointment APPLY 1 APPLICATION TOPICALLY AS NEEDED. 09/03/20   [name], MD  Misc. Devices (TRANSFER BENCH) MISC Use  for transfers prn 08/06/20   [name], MD  Multiple Vitamins-Minerals (ZINC PO) Take by mouth.    [provider]  Naloxone HCl (NARCAN NA) Place 1 application into the nose once.    [provider]  nystatin ointment (MYCOSTATIN) APPLY TO AFFECTED AREA TWICE A DAY 04/25/19   [name], MD  oxyCODONE (ROXICODONE) 15 MG immediate release tablet Take 1 tablet (15 mg total) by mouth every 4 (four) hours as needed for pain. 11/02/19   [name], MD  pantoprazole (PROTONIX) 20 MG tablet Take 1 tablet (20 mg total) by mouth 2 (two) times daily. 07/06/20 10/04/20  Kugler, [place], MD  predniSONE (DELTASONE) 10 MG tablet TAKE 2 TABLETS (20 MG TOTAL) BY MOUTH DAILY WITH BREAKFAST. 05/25/20   [name], MD  rosuvastatin (CRESTOR) 40 MG tablet Take 1 tablet (40 mg total) by mouth daily. 02/15/20 05/15/20  Lelon Perla, MD  spironolactone (ALDACTONE) 25 MG tablet TAKE 1 TABLET BY MOUTH EVERY DAY 07/10/20   [name], MD  sulfamethoxazole-trimethoprim (BACTRIM DS) 800-160 MG tablet Take 1 tablet by mouth 2 (two) times daily. 06/01/20   Marrian Salvage, FNP  SYMBICORT 160-4.5 MCG/ACT inhaler TAKE 2 PUFFS BY MOUTH TWICE A DAY 05/08/20   [name], MD  tamsulosin (FLOMAX) 0.4 MG CAPS capsule TAKE 1 CAPSULE BY MOUTH EVERY DAY 03/26/20   [name], MD  traZODone (DESYREL) 100 MG tablet TAKE 5 TABLETS BY MOUTH AT BEDTIME 09/15/19   [provider]   Allergies  Allergen Reactions  . Doxycycline   . Gabapentin Other (See Comments)    Pt states make him go down to the floor and can not get up  . Amlodipine Nausea And Vomiting and Other (See Comments)    dizziness  . Fish Allergy Nausea And Vomiting    STATES HE HAS NOT EATEN ANY FISH INCLUDING SHELLFISH FOR PAST 40 YRS - IT CAUSED NAUSEA  . Hydrocodone Itching and Rash  . Other Nausea And Vomiting    STATES HE HAS NOT EATEN ANY FISH INCLUDING SHELLFISH FOR  PAST 40 YRS - IT CAUSED NAUSEA  . Tylenol [Acetaminophen] Other (See Comments)    Liver problems   Review of Systems  Constitutional:       Generalized pain - legs, arms, back, buttocks    Physical Exam Vitals reviewed.  Constitutional:      General: He is not in acute distress.    Appearance: He is ill-appearing.     Interventions: Cervical collar in place.  Cardiovascular:     Rate and Rhythm: Normal rate and regular rhythm.  Pulmonary:     Effort: Pulmonary effort is normal.  Skin:    Comments: BLE with ace wraps  Neurological:     Mental Status: He is alert and oriented to person, place, and time.     Motor: Weakness present.  Psychiatric:        Mood and Affect: Mood normal.        Behavior: Behavior normal.     Vital Signs: BP (!) 102/48   Pulse 68   Temp 97.7 F (36.5 C) (Oral)   Resp 20   Ht _0  (1.778 m)   Wt 79.4 kg   SpO2 96%   BMI 25.11 kg/m  Pain Scale: 0-10   Pain Score: 8    SpO2: SpO2: 96 % O2 Device:SpO2: 96 % O2 Flow Rate: .   IO: Intake/output summary:   Intake/Output Summary (Last 24 hours) at 09/14/2020 1400 Last data filed at 09/14/2020 1332 Gross per 24 hour  Intake 748.53 ml  Output --  Net 748.53 ml    LBM:   Baseline Weight: Weight: 79.4 kg Most recent  weight: Weight: 79.4 kg      Palliative Assessment/Data: PPS 30%     Time In: 1800 Time Out: 1905 Time Total: 75 minutes Greater than 50%  of this time was spent counseling and coordinating care related to the above assessment and plan.  Signed by: Lavena Bullion, NP   Please contact Palliative Medicine Team phone at [phone] for questions and concerns.  For individual provider: See Shea Evans

## 2020-09-14 NOTE — ED Triage Notes (Signed)
Pt endorsing having fallen out of his bed this morning and landing on the floor. Pt reports hitting his head. Pt is on xarelto. Pt said his son was yelling at him for falling out of the bed.

## 2020-09-14 NOTE — Progress Notes (Signed)
Pharmacy Antibiotic Note  [name] is a 80 y.o. male admitted on 09/14/2020 with sepsis.  Pharmacy has been consulted for vancomycin and cefepime dosing. Pt is afebrile but WBC is elevated at 39.9. SCr is elevated at 1.79 but appears to be patients baseline. Lactic acid is elevated at 2.8.   Plan: Vancomycin 1500mg  IV x 1 then 1g IV Q24H Cefepime 2gm IV Q12H F/u renal fxn, C&S, clinical status and peak/trough at SS  Height: 5\' 10"  (177.8 cm) Weight: 79.4 kg (175 lb) IBW/kg (Calculated) : 73  Temp (24hrs), Avg:98.4 F (36.9 C), Min:98.4 F (36.9 C), Max:98.4 F (36.9 C)  Recent Labs  Lab 09/14/20 0830 09/14/20 0851  WBC 39.9*  --   CREATININE 1.79* 1.70*  LATICACIDVEN 2.8*  --     Estimated Creatinine Clearance: 37.6 mL/min (A) (by C-G formula based on SCr of 1.7 mg/dL (H)).    Allergies  Allergen Reactions  . Doxycycline   . Gabapentin Other (See Comments)    Pt states make him go down to the floor and can not get up  . Amlodipine Nausea And Vomiting and Other (See Comments)    dizziness  . Fish Allergy Nausea And Vomiting    STATES HE HAS NOT EATEN ANY FISH INCLUDING SHELLFISH FOR PAST 40 YRS - IT CAUSED NAUSEA  . Hydrocodone Itching and Rash  . Other Nausea And Vomiting    STATES HE HAS NOT EATEN ANY FISH INCLUDING SHELLFISH FOR PAST 40 YRS - IT CAUSED NAUSEA  . Tylenol [Acetaminophen] Other (See Comments)    Liver problems    Antimicrobials this admission: Vanc 5/27>> Cefepime 5/27>> Flagyl x 1 5/27  Dose adjustments this admission: N/A  Microbiology results: Pending  Thank you for allowing pharmacy to be a part of this patient's care.  Jomarion Mish, Rande Lawman 09/14/2020 10:16 AM

## 2020-09-14 NOTE — ED Notes (Signed)
Pt back from CT

## 2020-09-14 NOTE — H&P (Addendum)
History and Physical    [name] XLK:[id number] DOB: 24-Mar-1941 DOA: 09/14/2020  Referring MD/NP/PA:Joshua Long, MD PCP: [name], MD  Consultants:Crenshaw, Denice Bors, MD as PCP - Cardiology (Cardiology) Stanford Breed Denice Bors, MD as Consulting Physician (Cardiology) Rexene Alberts, MD as Consulting Physician (Cardiothoracic Surgery) Rolan Bucco, MD (Urology) Brandy Hale, MD as Consulting Physician (Anesthesiology) Patient coming from: Home ( son and daughter-in-law live with him) via EMS  Chief Complaint: Fall and altered  I have personally briefly reviewed patient's old medical records in [place]   HPI: [name] is a 80 y.o. male with medical history significant of hypertension, hyperlipidemia, atrial fibrillation on anticoagulation, CAD, PVD, iron deficiency anemia, BPH, cirrhosis, and alcohol abuse after patient had reportedly fallen out of bed and was noted to be altered by family.  At baseline patient appears to be bed chair bound and unable to get around without assistance.  At this time patient appears to be alert and oriented x4.  He reports that his leg wrappings had gotten caught up in the sheets leading to him falling this morning.  Patient notes that he had recently been placed on hospice and they had changed some of his medications around due to severe pain in his legs.  The patient's daughter-in-law reportedly showed EMS videos of the patient talking out of his head last.   EMS reported that there was concern for the patient care at home as he was found naked in his hospital bed with poor hygiene.  Patient reports that he manages his own medications.  He had reported taking 2 oxycodone pills and 2 morphine pills yesterday spaced out appropriately.  However, his pill bottle of oxycodone was filled yesterday with 90 pills, but he only had 40 pills left in the container.  EMS reported concerned that either the patient was abusing his  medications or family.  Patient's initial systolic blood pressure was 88/42 with heart rates in the 60s in atrial fibrillation.  He was given a bolus of 500 mL of normal saline IV fluids.  ED Course: Upon admission into the emergency department patient was seen to be afebrile, respirations 13-24, blood pressure 85/71 112/58, and O2 saturations maintained.  CT imaging of the head and cervical spine was significant for acute nondisplaced fractures of C1 posterior arch bilaterally.  Labs significant for WBC 39.9, Hemoglobin 6.7, and platelet count 343, potassium 3.2, BUN 19, creatinine 1.79, calcium 7.8, albumin 1.8, BNP 1149, and lactic acid 2.8.  Backs were noted to be positive.  Chest x-ray showed no acute abnormalities.  COVID-19 screening was positive.  Sepsis protocol has been initiated and patient had been given empiric antibiotics of vancomycin, metronidazole, and cefepime.  Patient was also typed and screened and transfused 1 unit of packed red blood cells.  Review of Systems  Constitutional: Positive for malaise/fatigue.  Respiratory: Negative for cough.   Cardiovascular: Negative for chest pain.  Musculoskeletal: Positive for falls and myalgias.  Neurological: Negative for loss of consciousness.     Past Medical History:  Diagnosis Date  . AAA (abdominal aortic aneurysm) ([place])   . Blindness of left eye   . 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 CAD (coronary artery disease)    a. s/p CABG in 10/2008 with LIMA-LAD, SVG-OM1 with Y-graft to PL branch, and SVG-RCA  . Carotid stenosis   . Cerebrovascular disease   . CHF (congestive heart failure) ([place])   . Cirrhosis ([place])  on CT a/p 07/2013, no longer drinking  . CONGENITAL UNSPEC REDUCTION DEFORMITY LOWER LIMB   . Constipation due to opioid therapy 2014   began about a year ago  . COPD (chronic obstructive pulmonary disease) ([place])   . HTN (hypertension)   . Hyperlipidemia   . Mobitz type 1 second  degree atrioventricular block 09/06/2013  . Peripheral vascular disease ([place])   . TOBACCO USE, QUIT     Past Surgical History:  Procedure Laterality Date  . CORONARY ARTERY BYPASS GRAFT  11/03/2008   Ricard Dillon - x4: left internal mammary artery to the distal left anterior descending, saphemous vein graft to the first circumflex marginal branch with a Y graft sequentiallly to a left posterolateral branch, saphenous vein graft to the distal right coronary artery  . ELECTROPHYSIOLOGIC STUDY N/A 01/18/2015   Procedure: A-Flutter Ablation;  Surgeon: Deboraha Sprang, MD;  Location: [facility];  Service: Cardiovascular;  Laterality: N/A;  . GREEN LIGHT LASER TURP (TRANSURETHRAL RESECTION OF PROSTATE N/A 02/14/2014   Procedure: GREEN LIGHT LASER TURP (TRANSURETHRAL RESECTION OF PROSTATE;  Surgeon: Festus Aloe, MD;  Location: [facility];  Service: Urology;  Laterality: N/A;  . HIP ARTHROPLASTY Left 01/26/2018   Procedure: LEFT HIP POSTERIOR HEMIARTHROPLASTY;  Surgeon: [name], MD;  Location: [facility];  Service: Orthopedics;  Laterality: Left;  . HIP CLOSED REDUCTION Left 09/18/2018   Procedure: CLOSED MANIPULATION HIP;  Surgeon: Justice Britain, MD;  Location: [facility];  Service: Orthopedics;  Laterality: Left;  . HIP CLOSED REDUCTION Left 10/08/2018   Procedure: CLOSED MANIPULATION HIP;  Surgeon: Rod Can, MD;  Location: [facility];  Service: Orthopedics;  Laterality: Left;  . TOTAL HIP REVISION Left 06/24/2018   Procedure: ACETABULAR HIP REVISION POSTERIOR;  Surgeon: [name], MD;  Location: [facility];  Service: Orthopedics;  Laterality: Left;  . TOTAL HIP REVISION Left 10/21/2018   Procedure: POSTERIOR REVISION HIP WITH POSSIBLE CONSTRAINED LINER;  Surgeon: [name], MD;  Location: [facility];  Service: Orthopedics;  Laterality: Left;     reports that he has quit smoking. He has never used smokeless tobacco. He reports that he does not drink alcohol and does not use drugs.  Allergies  Allergen  Reactions  . Doxycycline   . Gabapentin Other (See Comments)    Pt states make him go down to the floor and can not get up  . Amlodipine Nausea And Vomiting and Other (See Comments)    dizziness  . Fish Allergy Nausea And Vomiting    STATES HE HAS NOT EATEN ANY FISH INCLUDING SHELLFISH FOR PAST 40 YRS - IT CAUSED NAUSEA  . Hydrocodone Itching and Rash  . Other Nausea And Vomiting    STATES HE HAS NOT EATEN ANY FISH INCLUDING SHELLFISH FOR PAST 40 YRS - IT CAUSED NAUSEA  . Tylenol [Acetaminophen] Other (See Comments)    Liver problems    Family History  Problem Relation Age of Onset  . Cirrhosis Mother        died at 26  . Heart attack Father   . Other Brother        GSW  . Other Brother        died in house fire  . Cancer Brother        unsure of type Believes colon or prostate/fim  . Colon cancer Neg Hx   . Stomach cancer Neg Hx     Prior to Admission medications   Medication Sig Start Date End Date Taking? Authorizing  Provider  albuterol (VENTOLIN HFA) 108 (90 Base) MCG/ACT inhaler TAKE 2 PUFFS BY MOUTH EVERY 6 HOURS AS NEEDED FOR WHEEZE OR SHORTNESS OF BREATH 08/15/20   [name], MD  b complex vitamins tablet Take 1 tablet by mouth Mcdowell.    [provider]  buprenorphine (BUTRANS) 7.5 MCG/HR Place 1 patch onto the skin once a week. 06/25/20   [provider]  Cholecalciferol (VITAMIN D3 PO) Take by mouth.    [provider]  DULoxetine (CYMBALTA) 30 MG capsule TAKE 1 CAPSULE BY MOUTH EVERY DAY 08/02/20   [name], MD  ELIQUIS 5 MG TABS tablet TAKE 1 TABLET BY MOUTH TWICE A DAY 04/11/20   Lelon Perla, MD  ferrous sulfate 325 (65 FE) MG tablet Take 325 mg by mouth Mcdowell with breakfast.    [provider]  finasteride (PROSCAR) 5 MG tablet TAKE 1 TABLET BY MOUTH EVERY DAY 07/10/20   [name], MD  fluticasone [facility]) 50 MCG/ACT nasal spray PLACE 1 SPRAY INTO BOTH NOSTRILS EVERY MORNING. 10/25/19    [name], MD  furosemide (LASIX) 80 MG tablet TAKE 1 TABLET BY MOUTH EVERY DAY 09/03/20   [name], MD  Guaifenesin 1200 MG TB12 Take 1 tablet (1,200 mg total) by mouth 2 (two) times Mcdowell. Patient taking differently: Take 1,200 mg by mouth 2 (two) times Mcdowell as needed (mucus). 02/15/18   [name], MD  KLOR-CON M20 20 MEQ tablet TAKE 1 TABLET BY MOUTH EVERY DAY 09/03/20   Lelon Perla, MD  lactulose (CHRONULAC) 10 GM/15ML solution TAKE 30 MLS BY MOUTH 2 TIMES Mcdowell AS NEEDED FOR MILD CONSTIPATION OR MODERATE CONSTIPATION Patient taking differently: Take 20 g by mouth 2 (two) times Mcdowell as needed for mild constipation. 06/01/18   [name], MD  lidocaine (XYLOCAINE) 5 % ointment APPLY 1 APPLICATION TOPICALLY AS NEEDED. 09/03/20   [name], MD  Misc. Devices (TRANSFER BENCH) MISC Use for transfers prn 08/06/20   [name], MD  Multiple Vitamins-Minerals (ZINC PO) Take by mouth.    [provider]  Naloxone HCl (NARCAN NA) Place 1 application into the nose once.    [provider]  nystatin ointment (MYCOSTATIN) APPLY TO AFFECTED AREA TWICE A DAY 04/25/19   [name], MD  oxyCODONE (ROXICODONE) 15 MG immediate release tablet Take 1 tablet (15 mg total) by mouth every 4 (four) hours as needed for pain. 11/02/19   [name], MD  pantoprazole (PROTONIX) 20 MG tablet Take 1 tablet (20 mg total) by mouth 2 (two) times Mcdowell. 07/06/20 10/04/20  Kugler, [place], MD  predniSONE (DELTASONE) 10 MG tablet TAKE 2 TABLETS (20 MG TOTAL) BY MOUTH Mcdowell WITH BREAKFAST. 05/25/20   [name], MD  rosuvastatin (CRESTOR) 40 MG tablet Take 1 tablet (40 mg total) by mouth Mcdowell. 02/15/20 05/15/20  Lelon Perla, MD  spironolactone (ALDACTONE) 25 MG tablet TAKE 1 TABLET BY MOUTH EVERY DAY 07/10/20   [name], MD  sulfamethoxazole-trimethoprim (BACTRIM DS) 800-160 MG tablet Take 1 tablet  by mouth 2 (two) times Mcdowell. 06/01/20   Marrian Salvage, FNP  SYMBICORT 160-4.5 MCG/ACT inhaler TAKE 2 PUFFS BY MOUTH TWICE A DAY 05/08/20   [name], MD  tamsulosin (FLOMAX) 0.4 MG CAPS capsule TAKE 1 CAPSULE BY MOUTH EVERY DAY 03/26/20   [name], MD  traZODone (DESYREL) 100 MG tablet TAKE 5 TABLETS BY MOUTH AT BEDTIME  09/15/19   [provider]    Physical Exam:  Constitutional: Elderly male who appears to be chronically ill Vitals:   09/14/20 1404 09/14/20 1415 09/14/20 1430 09/14/20 1445  BP: 98/66 (!) 108/50 (!) 106/51   Pulse: 63 68 89   Resp: 18 18 18 20   Temp: 97.7 F (36.5 C)     TempSrc: Oral     SpO2: 97% 95%  100%  Weight:      Height:       Eyes: PERRL, lids and conjunctivae normal ENMT: Mucous membranes are moist. Posterior pharynx clear of any exudate or lesions.  Neck: normal, supple, no masses, no thyromegaly Respiratory: clear to auscultation bilaterally, no wheezing, no crackles. Normal respiratory effort. No accessory muscle use.  Cardiovascular: Irregular irregular, no murmurs / rubs / gallops. No extremity edema. 2+ pedal pulses. No carotid bruits.  Abdomen: no tenderness, no masses palpated. No hepatosplenomegaly. Bowel sounds positive.  Musculoskeletal: no clubbing / cyanosis. No joint deformity upper and lower extremities. Good ROM, no contractures. Normal muscle tone.  Skin: Bilateral lower extremities are wrapped with erythema noted of the right knee with abrasions appreciated Neurologic: CN 2-12 grossly intact. Sensation intact, DTR normal. Strength 5/5 in all 4.  Psychiatric: Normal judgment and insight. Alert and oriented x 3. Normal mood.     Labs on Admission: I have personally reviewed following labs and imaging studies  CBC: Recent Labs  Lab 09/14/20 0830 09/14/20 0851  WBC 39.9*  --   HGB 6.7* 7.1*  HCT 22.9* 21.0*  MCV 81.8  --   PLT 343  --    Basic Metabolic Panel: Recent Labs  Lab  09/14/20 0830 09/14/20 0851  NA 135 137  K 3.2* 3.2*  CL 103 102  CO2 21*  --   GLUCOSE 116* 112*  BUN 19 19  CREATININE 1.79* 1.70*  CALCIUM 7.8*  --    GFR: Estimated Creatinine Clearance: 37.6 mL/min (A) (by C-G formula based on SCr of 1.7 mg/dL (H)). Liver Function Tests: Recent Labs  Lab 09/14/20 0830  AST 30  ALT 14  ALKPHOS 118  BILITOT 0.8  PROT 6.0*  ALBUMIN 1.8*   No results for input(s): LIPASE, AMYLASE in the last 168 hours. No results for input(s): AMMONIA in the last 168 hours. Coagulation Profile: Recent Labs  Lab 09/14/20 0830  INR 1.4*   Cardiac Enzymes: No results for input(s): CKTOTAL, CKMB, CKMBINDEX, TROPONINI in the last 168 hours. BNP (last 3 results) No results for input(s): PROBNP in the last 8760 hours. HbA1C: No results for input(s): HGBA1C in the last 72 hours. CBG: No results for input(s): GLUCAP in the last 168 hours. Lipid Profile: No results for input(s): CHOL, HDL, LDLCALC, TRIG, CHOLHDL, LDLDIRECT in the last 72 hours. Thyroid Function Tests: No results for input(s): TSH, T4TOTAL, FREET4, T3FREE, THYROIDAB in the last 72 hours. Anemia Panel: No results for input(s): VITAMINB12, FOLATE, FERRITIN, TIBC, IRON, RETICCTPCT in the last 72 hours. Urine analysis:    Component Value Date/Time   COLORURINE STRAW (A) 01/14/2019 1738   APPEARANCEUR CLEAR 01/14/2019 1738   LABSPEC 1.009 01/14/2019 1738   PHURINE 5.0 01/14/2019 1738   GLUCOSEU NEGATIVE 01/14/2019 1738   GLUCOSEU NEGATIVE 01/12/2019 1517   HGBUR NEGATIVE 01/14/2019 1738   BILIRUBINUR NEGATIVE 01/14/2019 1738   BILIRUBINUR 3+ 09/16/2017 1617   KETONESUR NEGATIVE 01/14/2019 1738   PROTEINUR NEGATIVE 01/14/2019 1738   UROBILINOGEN 1.0 01/12/2019 1517   NITRITE NEGATIVE 01/14/2019 1738  LEUKOCYTESUR NEGATIVE 01/14/2019 1738   Sepsis Labs: Recent Results (from the past 240 hour(s))  Resp Panel by RT-PCR (Flu A&B, Covid) Nasopharyngeal Swab     Status: Abnormal    Collection Time: 09/14/20  9:21 AM   Specimen: Nasopharyngeal Swab; Nasopharyngeal(NP) swabs in vial transport medium  Result Value Ref Range Status   SARS Coronavirus 2 by RT PCR POSITIVE (A) NEGATIVE Final    Comment: RESULT CALLED TO, READ BACK BY AND VERIFIED WITH: RN E DIXON [id number] AT 1147 AM BY CM (NOTE) SARS-CoV-2 target nucleic acids are DETECTED.  The SARS-CoV-2 RNA is generally detectable in upper respiratory specimens during the acute phase of infection. Positive results are indicative of the presence of the identified virus, but do not rule out bacterial infection or co-infection with other pathogens not detected by the test. Clinical correlation with patient history and other diagnostic information is necessary to determine patient infection status. The expected result is Negative.  Fact Sheet for Patients: EntrepreneurPulse.com.au  Fact Sheet for Healthcare Providers: IncredibleEmployment.be  This test is not yet approved or cleared by the [place] FDA and  has been authorized for detection and/or diagnosis of SARS-CoV-2 by FDA under an Emergency Use Authorization (EUA).  This EUA will remain in effect (meaning this test can b e used) for the duration of  the COVID-19 declaration under Section 564(b)(1) of the Act, 21 U.S.C. section 360bbb-3(b)(1), unless the authorization is terminated or revoked sooner.     Influenza A by PCR NEGATIVE NEGATIVE Final   Influenza B by PCR NEGATIVE NEGATIVE Final    Comment: (NOTE) The Xpert Xpress SARS-CoV-2/FLU/RSV plus assay is intended as an aid in the diagnosis of influenza from Nasopharyngeal swab specimens and should not be used as a sole basis for treatment. Nasal washings and aspirates are unacceptable for Xpert Xpress SARS-CoV-2/FLU/RSV testing.  Fact Sheet for Patients: EntrepreneurPulse.com.au  Fact Sheet for Healthcare  Providers: IncredibleEmployment.be  This test is not yet approved or cleared by the [place] FDA and has been authorized for detection and/or diagnosis of SARS-CoV-2 by FDA under an Emergency Use Authorization (EUA). This EUA will remain in effect (meaning this test can be used) for the duration of the COVID-19 declaration under Section 564(b)(1) of the Act, 21 U.S.C. section 360bbb-3(b)(1), unless the authorization is terminated or revoked.  Performed at [facility], [place] [address], [place], [place] [address]      Radiological Exams on Admission: CT Head Wo Contrast  Result Date: 09/14/2020 CLINICAL DATA:  Fall from bed, altered mental status, trauma EXAM: CT HEAD WITHOUT CONTRAST CT CERVICAL SPINE WITHOUT CONTRAST TECHNIQUE: Multidetector CT imaging of the head and cervical spine was performed following the standard protocol without intravenous contrast. Multiplanar CT image reconstructions of the cervical spine were also generated. COMPARISON:  04/03/2012 FINDINGS: CT HEAD FINDINGS Brain: Mild atrophy pattern and white matter microvascular ischemic changes throughout both cerebral hemispheres. No acute intracranial hemorrhage, mass lesion, new infarction, midline shift, herniation, hydrocephalus, or extra-axial fluid collection. No focal mass effect or edema. Cisterns are patent. Cerebellar atrophy as well. Vascular: No hyperdense vessel or unexpected calcification. Skull: Normal. Negative for fracture or focal lesion. Sinuses/Orbits: No acute finding. Other: None. CT CERVICAL SPINE FINDINGS Alignment: Normal. Skull base and vertebrae: There are acute bilateral nondisplaced fractures of the C1 posterior arch posteriorly, images 20 through 23 of series 5. Anterior arch of C1 appears intact. [place] axial alignment remains intact. Progressive erosive degenerative arthropathy of the C1-2 articulation, often  seen with rheumatoid arthritis or CPPD. Odontoid remains  intact. Remainder of the cervical spine demonstrates no acute osseous finding. Soft tissues and spinal canal: No prevertebral fluid or swelling. No visible canal hematoma. Disc levels: Large anterior bulky osteophytes throughout the cervical spine as before. Advanced multilevel degenerative change throughout the C-spine with disc space narrowing, endplate sclerosis and osteophyte formation. Only minor facet arthropathy posteriorly. Preserved vertebral body heights. No focal kyphosis. Upper chest: Mild apical emphysema. Aorta atherosclerotic. Subclavian carotid atherosclerosis also noted. Other: None. IMPRESSION: No acute intracranial abnormality. Acute nondisplaced fractures of the C1 posterior arch bilaterally. Anterior arch of C1 is intact. Progressive erosive arthropathy at C1-2 articulation as seen with rheumatoid arthritis or CPPD. Similar advanced multilevel degenerative changes throughout the cervical spine as detailed above. These results were called by telephone at the time of interpretation on 09/14/2020 at 9:57 am to provider JOSHUA LONG , who verbally acknowledged these results. Electronically Signed   By: Jerilynn Mages.  Shick M.D.   On: 09/14/2020 09:59   CT Cervical Spine Wo Contrast  Result Date: 09/14/2020 CLINICAL DATA:  Fall from bed, altered mental status, trauma EXAM: CT HEAD WITHOUT CONTRAST CT CERVICAL SPINE WITHOUT CONTRAST TECHNIQUE: Multidetector CT imaging of the head and cervical spine was performed following the standard protocol without intravenous contrast. Multiplanar CT image reconstructions of the cervical spine were also generated. COMPARISON:  04/03/2012 FINDINGS: CT HEAD FINDINGS Brain: Mild atrophy pattern and white matter microvascular ischemic changes throughout both cerebral hemispheres. No acute intracranial hemorrhage, mass lesion, new infarction, midline shift, herniation, hydrocephalus, or extra-axial fluid collection. No focal mass effect or edema. Cisterns are patent. Cerebellar  atrophy as well. Vascular: No hyperdense vessel or unexpected calcification. Skull: Normal. Negative for fracture or focal lesion. Sinuses/Orbits: No acute finding. Other: None. CT CERVICAL SPINE FINDINGS Alignment: Normal. Skull base and vertebrae: There are acute bilateral nondisplaced fractures of the C1 posterior arch posteriorly, images 20 through 23 of series 5. Anterior arch of C1 appears intact. [place] axial alignment remains intact. Progressive erosive degenerative arthropathy of the C1-2 articulation, often seen with rheumatoid arthritis or CPPD. Odontoid remains intact. Remainder of the cervical spine demonstrates no acute osseous finding. Soft tissues and spinal canal: No prevertebral fluid or swelling. No visible canal hematoma. Disc levels: Large anterior bulky osteophytes throughout the cervical spine as before. Advanced multilevel degenerative change throughout the C-spine with disc space narrowing, endplate sclerosis and osteophyte formation. Only minor facet arthropathy posteriorly. Preserved vertebral body heights. No focal kyphosis. Upper chest: Mild apical emphysema. Aorta atherosclerotic. Subclavian carotid atherosclerosis also noted. Other: None. IMPRESSION: No acute intracranial abnormality. Acute nondisplaced fractures of the C1 posterior arch bilaterally. Anterior arch of C1 is intact. Progressive erosive arthropathy at C1-2 articulation as seen with rheumatoid arthritis or CPPD. Similar advanced multilevel degenerative changes throughout the cervical spine as detailed above. These results were called by telephone at the time of interpretation on 09/14/2020 at 9:57 am to provider JOSHUA LONG , who verbally acknowledged these results. Electronically Signed   By: Jerilynn Mages.  Shick M.D.   On: 09/14/2020 09:59   DG Chest Portable 1 View  Result Date: 09/14/2020 CLINICAL DATA:  Fall from bed. EXAM: PORTABLE CHEST 1 VIEW COMPARISON:  08/09/2019. FINDINGS: Normal heart size and mediastinal contours.  CABG. No acute infiltrate or edema. No effusion or pneumothorax. No acute osseous findings. Artifact from EKG leads. IMPRESSION: No active disease. Electronically Signed   By: Monte Fantasia M.D.   On: 09/14/2020 09:30    EKG:  Independently reviewed.  Atrial fibrillation at 72 bpm  Assessment/Plan SIRS/sepsis lower extremity wounds: Acute.  Patient was initially noted to be tachycardic and tachypneic with white blood cell count elevated up to 39.9 with lactic acid 2.8 and initially hypotensive.  At this time unclear source of infection question lower extremity wounds and possible cellulitis. -Admit to a progressive bed -Follow-up blood cultures -Check urinalysis -Continue empiric antibiotics of vancomycin and cefepime -Trend lactic acid levels -Wound care consult for lower extremity wounds  Acute medical encephalopathy: Patient appears to be alert and oriented x4. He had recently been started on morphine  in addition to oxycodone for pain which likely could have been the cause of his alternates.. -Neuro check -Follow-up recommendations by palliative care  Acute blood loss anemia: On admission hemoglobin noted to be 6.7 with positive guaiac stools.  Patient has been seen by gastroenterology earlier this month and risk of EGD were thought to outweigh benefits.  Patient had been transfused 1 unit of packed red blood cells.  -Held Eliquis -Continue to monitor and transfuse blood products as needed for hemoglobin less than 8 g/dL. -Appreciate GI consultative services.  No plans for any endoscopic evaluation at this time  C1 fracture secondary to fall: Patient appreciated to have a C1 fracture on imaging.  Neurosurgery had been consulted.  Given patient's multiple comorbidities no surgical invention likely to be recommended. -Continue cervical collar -Appreciate neurosurgery consultative services,  will follow-up for any further recommendations    COVID-19 infection: Incidentally found to be  positive for COVID-19.  Patient denies any complaints of cough.  Chest x-ray was otherwise clear and patient was maintaining O2 saturations on room air.. -Airborne precautions  Leukocytosis: WBC initially elevated at 39.9. -Follow-up peripheral smear and discussed results with hematology oncology in a.m. if needed.  Atrial fibrillation on chronic anticoagulation: Patient appears to be rate controlled. -Held Eliquis due to acute bleed  Hypokalemia: Acute. Initial potassium 3.2 -Give 40 mEq of potassium chloride p.o. x1 dose. -Continue to monitor and replace as needed  Protein calorie malnutrition: Suspect likely severe. -Check free albumin in a.m.  DNR: Present on admission patient is on hospice.   - palliative care was formally consult follow-up for any further recommendation  DVT prophylaxis: SCDs Code Status: DNR Family Communication: Attempted to update patient's son over the phone but no answer Disposition Plan: To be determined Consults called: GI, neurosurgery, palliative care Admission status: Inpatient, require more than 2 midnights  Norval Morton MD [facility]   If 7PM-7AM, please contact night-coverage   09/14/2020, 3:50 PM

## 2020-09-14 NOTE — ED Provider Notes (Signed)
Emergency Department Provider Note   I have reviewed the triage vital signs and the nursing notes.   HISTORY  Chief Complaint Hypotension, Fall, and Level 2 Fall on Thinners   HPI [name] is a 80 y.o. male with past medical history reviewed below including CAD, CHF, COPD, and anticoagulation presents to the emergency department with report of altered mental status and fall at home.  He apparently lives at home with his son and daughter-in-law.  The paramedics report that they initially responded with concern for altered mental status.  They found the patient to be hypotensive with blood pressure in the 80s.  Patient states that he was having severe pain in his legs from the edema wraps which were placed on both legs.  He states that made him confused when he tried to cut them off.  He tells me that he did fall out of the bed this morning and struck his right cheek.  He denies any pain other than his legs.   Son not available for additional history at this time.   Level 5 caveat: AMS   Attempted to call son by number listed in Epic with no response. Called daughter in law and was told this was the wrong number.   Past Medical History:  Diagnosis Date  . AAA (abdominal aortic aneurysm) ([place])   . Blindness of left eye   . 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 CAD (coronary artery disease)    a. s/p CABG in 10/2008 with LIMA-LAD, SVG-OM1 with Y-graft to PL branch, and SVG-RCA  . Carotid stenosis   . Cerebrovascular disease   . CHF (congestive heart failure) ([place])   . Cirrhosis ([place])    on CT a/p 07/2013, no longer drinking  . CONGENITAL UNSPEC REDUCTION DEFORMITY LOWER LIMB   . Constipation due to opioid therapy 2014   began about a year ago  . COPD (chronic obstructive pulmonary disease) ([place])   . HTN (hypertension)   . Hyperlipidemia   . 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 Peripheral vascular disease ([place])    . TOBACCO USE, QUIT     Patient Active Problem List   Diagnosis Date Noted  . Sepsis ([place]) 09/14/2020  . Dark stools 09/06/2020  . Iron deficiency anemia 07/11/2020  . Cellulitis 05/20/2020  . Bilateral hand pain 11/03/2019  . Anticoagulated 08/19/2019  . PAD (peripheral artery disease) ([place]) 07/27/2019  . Ulcer of right calf, limited to breakdown of skin ([place]) 07/21/2019  . Olecranon bursitis of left elbow 07/02/2018  . Failed total hip arthroplasty with dislocation, initial encounter ([place]) 06/22/2018  . Fracture of neck of femur ([place]) 02/11/2018  . History of hemiarthroplasty of left hip 01/26/2018  . Chronic diastolic heart failure ([place]) 03/16/2017  . CKD (chronic kidney disease) stage 3, GFR 30-59 ml/min (HCC) 03/16/2017  . Opiate dependence ([place]) 02/13/2017  . Acute renal failure superimposed on stage 3 chronic kidney disease ([place]) 01/21/2017  . Chronic atrial fibrillation ([place]) 11/24/2014  . Aortic stenosis 03/24/2014  . Second degree AV block, Mobitz type I 09/06/2013  . Cirrhosis ([place]) 07/28/2013  . Bladder outlet obstruction 07/28/2013  . Impaired glucose metabolism   . Chronic back pain   . Benign neoplasm of colon 01/08/2011  . Abdominal aortic aneurysm ([place]) 08/12/2010  . TOBACCO USE, QUIT 04/11/2009  . Occlusion and stenosis of carotid artery 01/08/2009  . Hyperlipidemia 11/13/2008  . ANEMIA 11/13/2008  . Essential hypertension 11/13/2008  .  Hx of CABG 11/13/2008  . COPD (chronic obstructive pulmonary disease) ([place]) 11/13/2008    Past Surgical History:  Procedure Laterality Date  . CORONARY ARTERY BYPASS GRAFT  11/03/2008   Ricard Dillon - x4: left internal mammary artery to the distal left anterior descending, saphemous vein graft to the first circumflex marginal branch with a Y graft sequentiallly to a left posterolateral branch, saphenous vein graft to the distal right coronary artery  . ELECTROPHYSIOLOGIC STUDY N/A 01/18/2015   Procedure: A-Flutter Ablation;   Surgeon: Deboraha Sprang, MD;  Location: [facility];  Service: Cardiovascular;  Laterality: N/A;  . GREEN LIGHT LASER TURP (TRANSURETHRAL RESECTION OF PROSTATE N/A 02/14/2014   Procedure: GREEN LIGHT LASER TURP (TRANSURETHRAL RESECTION OF PROSTATE;  Surgeon: Festus Aloe, MD;  Location: [facility];  Service: Urology;  Laterality: N/A;  . HIP ARTHROPLASTY Left 01/26/2018   Procedure: LEFT HIP POSTERIOR HEMIARTHROPLASTY;  Surgeon: [name], MD;  Location: [facility];  Service: Orthopedics;  Laterality: Left;  . HIP CLOSED REDUCTION Left 09/18/2018   Procedure: CLOSED MANIPULATION HIP;  Surgeon: Justice Britain, MD;  Location: [facility];  Service: Orthopedics;  Laterality: Left;  . HIP CLOSED REDUCTION Left 10/08/2018   Procedure: CLOSED MANIPULATION HIP;  Surgeon: Rod Can, MD;  Location: [facility];  Service: Orthopedics;  Laterality: Left;  . TOTAL HIP REVISION Left 06/24/2018   Procedure: ACETABULAR HIP REVISION POSTERIOR;  Surgeon: [name], MD;  Location: [facility];  Service: Orthopedics;  Laterality: Left;  . TOTAL HIP REVISION Left 10/21/2018   Procedure: POSTERIOR REVISION HIP WITH POSSIBLE CONSTRAINED LINER;  Surgeon: [name], MD;  Location: [facility];  Service: Orthopedics;  Laterality: Left;    Allergies Doxycycline, Gabapentin, Amlodipine, Fish allergy, Hydrocodone, Other, and Tylenol [acetaminophen]  Family History  Problem Relation Age of Onset  . Cirrhosis Mother        died at 47  . Heart attack Father   . Other Brother        GSW  . Other Brother        died in house fire  . Cancer Brother        unsure of type Believes colon or prostate/fim  . Colon cancer Neg Hx   . Stomach cancer Neg Hx     Social History Social History   Tobacco Use  . Smoking status: Former Research scientist (life sciences)  . Smokeless tobacco: Never Used  Vaping Use  . Vaping Use: Never used  Substance Use Topics  . Alcohol use: No    Alcohol/week: 0.0 standard drinks    Comment: History of heavy alcohol  use per pt. Quit many years ago1/1/ 2005  . Drug use: No    Review of Systems  Level 5 caveat: AMS  ____________________________________________   PHYSICAL EXAM:  VITAL SIGNS: ED Triage Vitals  Enc Vitals Group     BP 09/14/20 0800 (!) 94/50     Pulse Rate 09/14/20 0800 68     Resp 09/14/20 0800 19     Temp 09/14/20 0816 98.4 F (36.9 C)     Temp Source 09/14/20 0816 Oral     SpO2 09/14/20 0800 97 %     Weight 09/14/20 0817 175 lb (79.4 kg)     Height 09/14/20 0817 5\' 10"  (1.778 m)   Constitutional: Alert with some confusion noted. No acute distress.  Eyes: Conjunctivae are normal. PERRL. Head: Atraumatic. Nose: No congestion/rhinnorhea. Mouth/Throat: Mucous membranes are moist.   Neck: No stridor.   Cardiovascular: Normal rate,  regular rhythm. Good peripheral circulation. Grossly normal heart sounds.   Respiratory: Normal respiratory effort.  No retractions. Lungs CTAB. Gastrointestinal: Soft and nontender. No distention. Rectal exam: Owens Shark stool which is hemoccult positive. No gross blood or melena.  Musculoskeletal: No lower extremity tenderness nor edema. No gross deformities of extremities. Neurologic:  Normal speech and language. No gross focal neurologic deficits are appreciated.  Skin:  Skin is warm, dry and intact. No rash noted.   ____________________________________________   LABS (all labs ordered are listed, but only abnormal results are displayed)  Labs Reviewed  RESP PANEL BY RT-PCR (FLU A&B, COVID) ARPGX2 - Abnormal; Notable for the following components:      Result Value   SARS Coronavirus 2 by RT PCR POSITIVE (*)    All other components within normal limits  COMPREHENSIVE METABOLIC PANEL - Abnormal; Notable for the following components:   Potassium 3.2 (*)    CO2 21 (*)    Glucose, Bld 116 (*)    Creatinine, Ser 1.79 (*)    Calcium 7.8 (*)    Total Protein 6.0 (*)    Albumin 1.8 (*)    GFR, Estimated 39 (*)    All other components within  normal limits  CBC - Abnormal; Notable for the following components:   WBC 39.9 (*)    RBC 2.80 (*)    Hemoglobin 6.7 (*)    HCT 22.9 (*)    MCH 23.9 (*)    MCHC 29.3 (*)    RDW 20.2 (*)    All other components within normal limits  LACTIC ACID, PLASMA - Abnormal; Notable for the following components:   Lactic Acid, Venous 2.8 (*)    All other components within normal limits  PROTIME-INR - Abnormal; Notable for the following components:   Prothrombin Time 16.7 (*)    INR 1.4 (*)    All other components within normal limits  BRAIN NATRIURETIC PEPTIDE - Abnormal; Notable for the following components:   B Natriuretic Peptide 1,149.0 (*)    All other components within normal limits  I-STAT CHEM 8, ED - Abnormal; Notable for the following components:   Potassium 3.2 (*)    Creatinine, Ser 1.70 (*)    Glucose, Bld 112 (*)    Calcium, Ion 1.05 (*)    TCO2 20 (*)    Hemoglobin 7.1 (*)    HCT 21.0 (*)    All other components within normal limits  POC OCCULT BLOOD, ED - Abnormal; Notable for the following components:   Fecal Occult Bld POSITIVE (*)    All other components within normal limits  CULTURE, BLOOD (ROUTINE X 2) W REFLEX TO ID PANEL  ETHANOL  APTT  URINALYSIS, ROUTINE W REFLEX MICROSCOPIC  SAMPLE TO BLOOD BANK  PREPARE RBC (CROSSMATCH)  TYPE AND SCREEN   ____________________________________________  EKG   EKG Interpretation  Date/Time:  Friday Sep 14 2020 08:00:04 EDT Ventricular Rate:  72 PR Interval:    QRS Duration: 112 QT Interval:  488 QTC Calculation: 535 R Axis:   17 Text Interpretation: Atrial fibrillation Ventricular premature complex Borderline intraventricular conduction delay Abnormal R-wave progression, early transition Prolonged QT interval Confirmed by Nanda Quinton [id number]) on 09/14/2020 3:35:59 PM       ____________________________________________  RADIOLOGY  CT Head Wo Contrast  Result Date: 09/14/2020 CLINICAL DATA:  Fall from bed,  altered mental status, trauma EXAM: CT HEAD WITHOUT CONTRAST CT CERVICAL SPINE WITHOUT CONTRAST TECHNIQUE: Multidetector CT imaging of the head and cervical spine  was performed following the standard protocol without intravenous contrast. Multiplanar CT image reconstructions of the cervical spine were also generated. COMPARISON:  04/03/2012 FINDINGS: CT HEAD FINDINGS Brain: Mild atrophy pattern and white matter microvascular ischemic changes throughout both cerebral hemispheres. No acute intracranial hemorrhage, mass lesion, new infarction, midline shift, herniation, hydrocephalus, or extra-axial fluid collection. No focal mass effect or edema. Cisterns are patent. Cerebellar atrophy as well. Vascular: No hyperdense vessel or unexpected calcification. Skull: Normal. Negative for fracture or focal lesion. Sinuses/Orbits: No acute finding. Other: None. CT CERVICAL SPINE FINDINGS Alignment: Normal. Skull base and vertebrae: There are acute bilateral nondisplaced fractures of the C1 posterior arch posteriorly, images 20 through 23 of series 5. Anterior arch of C1 appears intact. [place] axial alignment remains intact. Progressive erosive degenerative arthropathy of the C1-2 articulation, often seen with rheumatoid arthritis or CPPD. Odontoid remains intact. Remainder of the cervical spine demonstrates no acute osseous finding. Soft tissues and spinal canal: No prevertebral fluid or swelling. No visible canal hematoma. Disc levels: Large anterior bulky osteophytes throughout the cervical spine as before. Advanced multilevel degenerative change throughout the C-spine with disc space narrowing, endplate sclerosis and osteophyte formation. Only minor facet arthropathy posteriorly. Preserved vertebral body heights. No focal kyphosis. Upper chest: Mild apical emphysema. Aorta atherosclerotic. Subclavian carotid atherosclerosis also noted. Other: None. IMPRESSION: No acute intracranial abnormality. Acute nondisplaced fractures  of the C1 posterior arch bilaterally. Anterior arch of C1 is intact. Progressive erosive arthropathy at C1-2 articulation as seen with rheumatoid arthritis or CPPD. Similar advanced multilevel degenerative changes throughout the cervical spine as detailed above. These results were called by telephone at the time of interpretation on 09/14/2020 at 9:57 am to provider Tacie Mccuistion , who verbally acknowledged these results. Electronically Signed   By: Jerilynn Mages.  Shick M.D.   On: 09/14/2020 09:59   CT Cervical Spine Wo Contrast  Result Date: 09/14/2020 CLINICAL DATA:  Fall from bed, altered mental status, trauma EXAM: CT HEAD WITHOUT CONTRAST CT CERVICAL SPINE WITHOUT CONTRAST TECHNIQUE: Multidetector CT imaging of the head and cervical spine was performed following the standard protocol without intravenous contrast. Multiplanar CT image reconstructions of the cervical spine were also generated. COMPARISON:  04/03/2012 FINDINGS: CT HEAD FINDINGS Brain: Mild atrophy pattern and white matter microvascular ischemic changes throughout both cerebral hemispheres. No acute intracranial hemorrhage, mass lesion, new infarction, midline shift, herniation, hydrocephalus, or extra-axial fluid collection. No focal mass effect or edema. Cisterns are patent. Cerebellar atrophy as well. Vascular: No hyperdense vessel or unexpected calcification. Skull: Normal. Negative for fracture or focal lesion. Sinuses/Orbits: No acute finding. Other: None. CT CERVICAL SPINE FINDINGS Alignment: Normal. Skull base and vertebrae: There are acute bilateral nondisplaced fractures of the C1 posterior arch posteriorly, images 20 through 23 of series 5. Anterior arch of C1 appears intact. [place] axial alignment remains intact. Progressive erosive degenerative arthropathy of the C1-2 articulation, often seen with rheumatoid arthritis or CPPD. Odontoid remains intact. Remainder of the cervical spine demonstrates no acute osseous finding. Soft tissues and spinal  canal: No prevertebral fluid or swelling. No visible canal hematoma. Disc levels: Large anterior bulky osteophytes throughout the cervical spine as before. Advanced multilevel degenerative change throughout the C-spine with disc space narrowing, endplate sclerosis and osteophyte formation. Only minor facet arthropathy posteriorly. Preserved vertebral body heights. No focal kyphosis. Upper chest: Mild apical emphysema. Aorta atherosclerotic. Subclavian carotid atherosclerosis also noted. Other: None. IMPRESSION: No acute intracranial abnormality. Acute nondisplaced fractures of the C1 posterior arch bilaterally. Anterior arch of C1  is intact. Progressive erosive arthropathy at C1-2 articulation as seen with rheumatoid arthritis or CPPD. Similar advanced multilevel degenerative changes throughout the cervical spine as detailed above. These results were called by telephone at the time of interpretation on 09/14/2020 at 9:57 am to provider Vikkie Goeden , who verbally acknowledged these results. Electronically Signed   By: Jerilynn Mages.  Shick M.D.   On: 09/14/2020 09:59   DG Chest Portable 1 View  Result Date: 09/14/2020 CLINICAL DATA:  Fall from bed. EXAM: PORTABLE CHEST 1 VIEW COMPARISON:  08/09/2019. FINDINGS: Normal heart size and mediastinal contours. CABG. No acute infiltrate or edema. No effusion or pneumothorax. No acute osseous findings. Artifact from EKG leads. IMPRESSION: No active disease. Electronically Signed   By: Monte Fantasia M.D.   On: 09/14/2020 09:30    ____________________________________________   PROCEDURES  Procedure(s) performed:   Procedures  CRITICAL CARE Performed by: Margette Fast Total critical care time: 35 minutes Critical care time was exclusive of separately billable procedures and treating other patients. Critical care was necessary to treat or prevent imminent or life-threatening deterioration. Critical care was time spent personally by me on the following activities:  development of treatment plan with patient and/or surrogate as well as nursing, discussions with consultants, evaluation of patient's response to treatment, examination of patient, obtaining history from patient or surrogate, ordering and performing treatments and interventions, ordering and review of laboratory studies, ordering and review of radiographic studies, pulse oximetry and re-evaluation of patient's condition.  Nanda Quinton, MD Emergency Medicine  ____________________________________________   INITIAL IMPRESSION / ASSESSMENT AND PLAN / ED COURSE  Pertinent labs & imaging results that were available during my care of the patient were reviewed by me and considered in my medical decision making (see chart for details).   Patient presents to the emergency department with report of altered mental status initially.  Found to be hypotensive with EMS and later reported fall from his bed.  He is anticoagulated and nursing staff activated a level 2 trauma per protocol.  I do not see outward sign of head injury.  He does have some confusion and is anticoagulated.  Plan for CT imaging of the head along with chest x-ray.  Patient has normal passive range of motion of the hips.  And further chart review it looks as if the patient has been dealing with multiple medical issues including iron deficiency anemia.  He has been referred to [facility] who saw him on May 3 but given his chronic medical issues opted to defer endoscopy.  His primary care doctor saw him on 5/19 with reported at that time of some dark stools.  He refused ED evaluation at that time.  There is mention in the chart of referral to hospice.  Will confirm this with son when I am able to reach him.  No additional history available at this time.  Patient's blood pressures have responded to IV fluids.  Have sent type and screen.  I-STAT labs showed hemoglobin again low at 7.1.  We will follow the CBC to evaluate need for possible blood  transfusion.   10:30 AM  Spoke with the patient's son by phone at [phone].  He tells me that the patient was very confused and combative somewhat through the night.  He managed as best he could but ultimately called EMS.  He confirms the patient did fall out of bed last night.  He tells me that the patient is currently enrolled in hospice and that  he is DNR.  I have reflected this in the chart.  He tells me that he is okay with what has been done for the patient so far including IV fluids, antibiotics, PRBC transfusion.  He notes that his dad would not want any additional aggressive measures.   Discussed patient's case with [facility] to request admission. Patient and family (if present) updated with plan. Care transferred to [facility] service.  I reviewed all nursing notes, vitals, pertinent old records, EKGs, labs, imaging (as available).  ____________________________________________  FINAL CLINICAL IMPRESSION(S) / ED DIAGNOSES  Final diagnoses:  Symptomatic anemia  Hypotension, unspecified hypotension type  Closed nondisplaced fracture of posterior arch of first cervical vertebra, initial encounter (HCC)     MEDICATIONS GIVEN DURING THIS VISIT:  Medications  lactated ringers infusion ( Intravenous New Bag/Given 09/14/20 0848)  ceFEPIme (MAXIPIME) 2 g in sodium chloride 0.9 % 100 mL IVPB (has no administration in time range)  vancomycin (VANCOREADY) IVPB 1000 mg/200 mL (has no administration in time range)  fentaNYL (SUBLIMAZE) injection 25 mcg (25 mcg Intravenous Given 09/14/20 1144)  fentaNYL (SUBLIMAZE) injection 50 mcg (50 mcg Intravenous Given 09/14/20 0943)  0.9 %  sodium chloride infusion (0 mL/hr Intravenous Stopped 09/14/20 1405)  ceFEPIme (MAXIPIME) 2 g in sodium chloride 0.9 % 100 mL IVPB (0 g Intravenous Stopped 09/14/20 1145)  metroNIDAZOLE (FLAGYL) IVPB 500 mg (0 mg Intravenous Stopped 09/14/20 1233)  vancomycin (VANCOREADY) IVPB 1500 mg/300 mL (0 mg Intravenous Stopped 09/14/20  1400)    Note:  This document was prepared using Dragon voice recognition software and may include unintentional dictation errors.  Nanda Quinton, MD, [facility] Emergency Medicine    Brayli Klingbeil, [facility], MD 09/14/20 1536

## 2020-09-14 NOTE — ED Triage Notes (Signed)
Pt arrived to ED via EMS from home where he lives with his son and daughter in law. EMS reports they were called out for AMS that occurred last night. Pt's daughter in law showed EMS multiple videos that she took of pt "talking ou of his head" and hallucinating. EMS confirms that pt appeared altered in videos. Pt w/ EMS A&Ox4 w/ no AMS noted. Pt is hospice pt for comfort measures and is not a DNR. EMS reports concern for pt care at home. Pt was found naked in his hospital bed and concern for family providing hygiene care. EMS also reports that pt meds were adjusted w/ new prescriptions for xarelto, ativan and morphine. EMS reports pt is chronically on oxycodone for pain. Pt reports taking 2 oxycodone pills and 2 morphine pills yesterday spaced out appropriately. EMS noted that the oxycodone was filled on 09-13-20 for 90 pills. EMS counted pills and there are 48 pills in the container. EMS is concerned that either the pt is abusing his pain meds or his family is. Pt reports his daughter in law is "on something", but doesn't know what it is. After EMS received all of this information on scene, they noted that pt was hypotensive w/ SBP in the 80's. EMS VS: 88/42, HR 60's A-fib. 18g L AC placed and 53mL NS bolus given.   Pt is pale, A&Ox4 and has bilateral leg wraps d/t wounds.

## 2020-09-14 NOTE — Consult Note (Addendum)
[facility] Consult: 12:23 PM 09/14/2020  LOS: 0 days    Referring Provider: Dr Laverta Baltimore in ED.    Primary Care Physician:  [name], MD Primary Gastroenterologist:  Dr. Ardis Hughs    Reason for Consultation:  Anemia, acute on chronic.     HPI: [name] is a 80 y.o. male.  Hospice at home patient.  Nonambulatory due to weakness/deconditioning.  CAD, CABG 2010.  Cerebrovascular disease/carotid stenosis.  CKD.  CHF.  COPD.  Hypertension.  Peripheral vascular disease.  A. fib on Eliquis.  Lower extremity cellulitis.  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  BPH, status post TURP.  Hip surgeries Colonoscopy 2012 with removal of single tubular adenomatous polyp, melanosis.  No previous EGD.  GI Office visit 08/21/2020 with PA- C Zehr for evaluation of IDA.  Hb was 6.6 on 3/18 compared with 12.4 six months prior.  Low iron and low iron sats but negative Hemoccult.  Recieved PRBC x 1 (Hgb 8.9 on 3/22) and parenteral iron by oncology.  Patient denied melena, bleeding per rectum. Because of his multiple medical issues and baseline poor general health, GI did not plan endo/colonoscopy but would consider it if he made progress in terms of conditioning.  Would consider more urgent endoscopies if had any overt bleeding.  His cirrhosis was felt to be well compensated, CT scan 03/2020 showed cirrhosis but no suspicious liver lesions, stable 3.3 cm infrarenal AAA.[name]  They sent him home with 3 Hemoccult cards.  Brought to the ED via EMS after developing altered mental status, "talking out of his head", hallucinating.  [place] out of bed, striking his head this morning.  After doing a pill count of his oxycodone (Rx filled for 90 pills on 5/26 and only 48 pills in the container). EMS concerned pt was  taking more than prescribed or his family is using the Oxy.   Hypotensive sbps to 80s per EMS.  Lactic acid elevated 2.8. At DRE there was brown stool which tested FOBT positive. Pt denies abd pain, bloody or black stool.  Erratic appetite w/o N/V.  Takes the oxycodone for pain that is "all over".  WBCs 39.9.  Hb 6.7 (8.9 on 3/22 after the PRBC).  Platelets normal. Creatinine 1.7 which is actually improved from March. LFTs normal with the exception of low albumin 1.8. COVID-19 positive  Initiating vancomycin, cefepime.      Past Medical History:  Diagnosis Date  . AAA (abdominal aortic aneurysm) ([place])   . Blindness of left eye   . 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 CAD (coronary artery disease)    a. s/p CABG in 10/2008 with LIMA-LAD, SVG-OM1 with Y-graft to PL branch, and SVG-RCA  . Carotid stenosis   . Cerebrovascular disease   . CHF (congestive heart failure) ([place])   . Cirrhosis ([place])    on CT a/p 07/2013, no longer drinking  . CONGENITAL UNSPEC REDUCTION DEFORMITY LOWER LIMB   . Constipation due to opioid therapy 2014   began about a  year ago  . COPD (chronic obstructive pulmonary disease) ([place])   . HTN (hypertension)   . Hyperlipidemia   . 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 Peripheral vascular disease ([place])   . TOBACCO USE, QUIT     Past Surgical History:  Procedure Laterality Date  . CORONARY ARTERY BYPASS GRAFT  11/03/2008   Ricard Dillon - x4: left internal mammary artery to the distal left anterior descending, saphemous vein graft to the first circumflex marginal branch with a Y graft sequentiallly to a left posterolateral branch, saphenous vein graft to the distal right coronary artery  . ELECTROPHYSIOLOGIC STUDY N/A 01/18/2015   Procedure: A-Flutter Ablation;  Surgeon: Deboraha Sprang, MD;  Location: [facility];  Service: Cardiovascular;  Laterality: N/A;  . GREEN LIGHT LASER TURP (TRANSURETHRAL  RESECTION OF PROSTATE N/A 02/14/2014   Procedure: GREEN LIGHT LASER TURP (TRANSURETHRAL RESECTION OF PROSTATE;  Surgeon: Festus Aloe, MD;  Location: [facility];  Service: Urology;  Laterality: N/A;  . HIP ARTHROPLASTY Left 01/26/2018   Procedure: LEFT HIP POSTERIOR HEMIARTHROPLASTY;  Surgeon: [name], MD;  Location: [facility];  Service: Orthopedics;  Laterality: Left;  . HIP CLOSED REDUCTION Left 09/18/2018   Procedure: CLOSED MANIPULATION HIP;  Surgeon: Justice Britain, MD;  Location: [facility];  Service: Orthopedics;  Laterality: Left;  . HIP CLOSED REDUCTION Left 10/08/2018   Procedure: CLOSED MANIPULATION HIP;  Surgeon: Rod Can, MD;  Location: [facility];  Service: Orthopedics;  Laterality: Left;  . TOTAL HIP REVISION Left 06/24/2018   Procedure: ACETABULAR HIP REVISION POSTERIOR;  Surgeon: [name], MD;  Location: [facility];  Service: Orthopedics;  Laterality: Left;  . TOTAL HIP REVISION Left 10/21/2018   Procedure: POSTERIOR REVISION HIP WITH POSSIBLE CONSTRAINED LINER;  Surgeon: [name], MD;  Location: [facility];  Service: Orthopedics;  Laterality: Left;    Prior to Admission medications   Medication Sig Start Date End Date Taking? Authorizing Provider  albuterol (VENTOLIN HFA) 108 (90 Base) MCG/ACT inhaler TAKE 2 PUFFS BY MOUTH EVERY 6 HOURS AS NEEDED FOR WHEEZE OR SHORTNESS OF BREATH 08/15/20   [name], MD  b complex vitamins tablet Take 1 tablet by mouth daily.    [provider]  buprenorphine (BUTRANS) 7.5 MCG/HR Place 1 patch onto the skin once a week. 06/25/20   [provider]  Cholecalciferol (VITAMIN D3 PO) Take by mouth.    [provider]  DULoxetine (CYMBALTA) 30 MG capsule TAKE 1 CAPSULE BY MOUTH EVERY DAY 08/02/20   [name], MD  ELIQUIS 5 MG TABS tablet TAKE 1 TABLET BY MOUTH TWICE A DAY 04/11/20   Lelon Perla, MD  ferrous sulfate 325 (65 FE) MG tablet Take 325 mg by mouth daily with breakfast.    [provider]  finasteride (PROSCAR) 5 MG tablet TAKE 1 TABLET BY MOUTH EVERY DAY 07/10/20   [name], MD  fluticasone [facility]) 50 MCG/ACT nasal spray PLACE 1 SPRAY INTO BOTH NOSTRILS EVERY MORNING. 10/25/19   [name], MD  furosemide (LASIX) 80 MG tablet TAKE 1 TABLET BY MOUTH EVERY DAY 09/03/20   [name], MD  Guaifenesin 1200 MG TB12 Take 1 tablet (1,200 mg total) by mouth 2 (two) times daily. Patient taking differently: Take 1,200 mg by mouth 2 (two) times daily as needed (mucus). 02/15/18   [name], MD  KLOR-CON M20 20 MEQ tablet TAKE 1 TABLET BY MOUTH EVERY DAY 09/03/20   Crenshaw,  Denice Bors, MD  lactulose (CHRONULAC) 10 GM/15ML solution TAKE 30 MLS BY MOUTH 2 TIMES DAILY AS NEEDED FOR MILD CONSTIPATION OR MODERATE CONSTIPATION Patient taking differently: Take 20 g by mouth 2 (two) times daily as needed for mild constipation. 06/01/18   [name], MD  lidocaine (XYLOCAINE) 5 % ointment APPLY 1 APPLICATION TOPICALLY AS NEEDED. 09/03/20   [name], MD  Misc. Devices (TRANSFER BENCH) MISC Use for transfers prn 08/06/20   [name], MD  Multiple Vitamins-Minerals (ZINC PO) Take by mouth.    [provider]  Naloxone HCl (NARCAN NA) Place 1 application into the nose once.    [provider]  nystatin ointment (MYCOSTATIN) APPLY TO AFFECTED AREA TWICE A DAY 04/25/19   [name], MD  oxyCODONE (ROXICODONE) 15 MG immediate release tablet Take 1 tablet (15 mg total) by mouth every 4 (four) hours as needed for pain. 11/02/19   [name], MD  pantoprazole (PROTONIX) 20 MG tablet Take 1 tablet (20 mg total) by mouth 2 (two) times daily. 07/06/20 10/04/20  Kugler, [place], MD  predniSONE (DELTASONE) 10 MG tablet TAKE 2 TABLETS (20 MG TOTAL) BY MOUTH DAILY WITH BREAKFAST. 05/25/20   [name], MD  rosuvastatin (CRESTOR) 40 MG tablet Take 1 tablet (40 mg total) by mouth daily.  02/15/20 05/15/20  Lelon Perla, MD  spironolactone (ALDACTONE) 25 MG tablet TAKE 1 TABLET BY MOUTH EVERY DAY 07/10/20   [name], MD  sulfamethoxazole-trimethoprim (BACTRIM DS) 800-160 MG tablet Take 1 tablet by mouth 2 (two) times daily. 06/01/20   Marrian Salvage, FNP  SYMBICORT 160-4.5 MCG/ACT inhaler TAKE 2 PUFFS BY MOUTH TWICE A DAY 05/08/20   [name], MD  tamsulosin (FLOMAX) 0.4 MG CAPS capsule TAKE 1 CAPSULE BY MOUTH EVERY DAY 03/26/20   [name], MD  traZODone (DESYREL) 100 MG tablet TAKE 5 TABLETS BY MOUTH AT BEDTIME 09/15/19   [provider]    Scheduled Meds:  Infusions: . ceFEPime (MAXIPIME) IV    . lactated ringers 125 mL/hr at 09/14/20 0848  . metronidazole 500 mg (09/14/20 1124)  . [START ON 09/15/2020] vancomycin    . vancomycin 1,500 mg (09/14/20 1155)   PRN Meds: fentaNYL (SUBLIMAZE) injection   Allergies as of 09/14/2020 - Review Complete 09/14/2020  Allergen Reaction Noted  . Doxycycline  01/26/2017  . Gabapentin Other (See Comments) 10/21/2018  . Amlodipine Nausea And Vomiting and Other (See Comments) 01/17/2015  . Fish allergy Nausea And Vomiting 03/16/2017  . Hydrocodone Itching and Rash   . Other Nausea And Vomiting 02/07/2014  . Tylenol [acetaminophen] Other (See Comments) 02/02/2014    Family History  Problem Relation Age of Onset  . Cirrhosis Mother        died at 66  . Heart attack Father   . Other Brother        GSW  . Other Brother        died in house fire  . Cancer Brother        unsure of type Believes colon or prostate/fim  . Colon cancer Neg Hx   . Stomach cancer Neg Hx     Social History   Socioeconomic History  . Marital status: Widowed    Spouse name: Not on file  . Number of children: 2  . Years of education: 24  . Highest education level: High school graduate  Occupational History  . Occupation: Retired  Tobacco Use  .  Smoking status: Former Research scientist (life sciences)  . Smokeless  tobacco: Never Used  Vaping Use  . Vaping Use: Never used  Substance and Sexual Activity  . Alcohol use: No    Alcohol/week: 0.0 standard drinks    Comment: History of heavy alcohol use per pt. Quit many years ago1/1/ 2005  . Drug use: No  . Sexual activity: Not Currently  Other Topics Concern  . Not on file  Social History Narrative   He lives alone here in [place].  He has 1 son, who lives in the [place],    Social Determinants of Health   Financial Resource Strain: Not on file  Food Insecurity: Not on file  Transportation Needs: Not on file  Physical Activity: Not on file  Stress: Not on file  Social Connections: Not on file  Intimate Partner Violence: Not on file    REVIEW OF SYSTEMS: Constitutional: Weakness is chronic.  He has been bedbound for 4 months at least. ENT:  No nose bleeds Pulm: Denies shortness of breath and cough. CV:  No palpitations, no LE edema.  GU:  No hematuria, no frequency GI: See HPI Heme: Denies unusual bleeding or bruising. Transfusions: See HPI. Neuro:  No headaches, no peripheral tingling or numbness Derm:  No itching, no rash or sores.  Endocrine:  No sweats or chills.  No polyuria or dysuria Immunization: Never vaccinated for COVID 19, " didn't want to" Travel:  None beyond local counties in last few months.    PHYSICAL EXAM: Vital signs in last 24 hours: Vitals:   09/14/20 1200 09/14/20 1215  BP: (!) 103/46 (!) 107/44  Pulse: 67 77  Resp: 15 (!) 21  Temp:    SpO2: 98%    Wt Readings from Last 3 Encounters:  09/14/20 79.4 kg  08/13/20 81.6 kg  07/18/20 81.6 kg    General: Chronically and acutely severely ill appearing, obese. Head: No facial asymmetry or swelling.  No signs of head trauma. Eyes: Conjunctiva pale.  No scleral icterus. Ears: No gross hearing deficit Nose: No congestion or discharge Mouth: Edentulous.  Tongue midline.  Mucosa is clear, pink but dry. Neck: No thyromegaly, no JVD Lungs: Rough vocal  quality.  Clear bilaterally. Heart: Irregularly irregular rhythm without tachycardia or bradycardia.  S1, S2 audible.  No MRG. Abdomen: Soft without tenderness.  Active bowel sounds.  No palpable masses, organomegaly, hernias, bruits..   Rectal: Stool brown, FOBT positive submitted to lab today.  Did not repeat DRE Musc/Skeltl: No gross joint deformities but generalized swelling in the knees without erythema. Extremities: [place] wrapping both legs from the feet to the knees with swelling visible in the toes. Neurologic: Appropriate.  Oriented to place, self, year.  Requires frequent cueing to follow commands.  Goes off topic during questioning but easy to bring back to subject at hand.  No asterixis.  No tremors. Skin: Pale Nodes: No cervical adenopathy Psych: Calm, cooperative.  Intake/Output from previous day: No intake/output data recorded. Intake/Output this shift: Total I/O In: 639 [I.V.:538.9; IV Piggyback:100.1] Out: -   LAB RESULTS: Recent Labs    09/14/20 0830 09/14/20 0851  WBC 39.9*  --   HGB 6.7* 7.1*  HCT 22.9* 21.0*  PLT 343  --    BMET Lab Results  Component Value Date   NA 137 09/14/2020   NA 135 09/14/2020   NA 136 07/10/2020   K 3.2 (L) 09/14/2020   K 3.2 (L) 09/14/2020   K 3.3 (L) 07/10/2020   CL  102 09/14/2020   CL 103 09/14/2020   CL 101 07/10/2020   CO2 21 (L) 09/14/2020   CO2 24 07/10/2020   CO2 26 07/06/2020   GLUCOSE 112 (H) 09/14/2020   GLUCOSE 116 (H) 09/14/2020   GLUCOSE 156 (H) 07/10/2020   BUN 19 09/14/2020   BUN 19 09/14/2020   BUN 37 (H) 07/10/2020   CREATININE 1.70 (H) 09/14/2020   CREATININE 1.79 (H) 09/14/2020   CREATININE 2.08 (H) 07/10/2020   CALCIUM 7.8 (L) 09/14/2020   CALCIUM 8.2 (L) 07/10/2020   CALCIUM 7.9 (L) 07/06/2020   LFT Recent Labs    09/14/20 0830  PROT 6.0*  ALBUMIN 1.8*  AST 30  ALT 14  ALKPHOS 118  BILITOT 0.8   PT/INR Lab Results  Component Value Date   INR 1.4 (H) 09/14/2020   INR 1.6 (H)  07/06/2020   INR 1.17 02/07/2014   Hepatitis Panel No results for input(s): HEPBSAG, HCVAB, HEPAIGM, HEPBIGM in the last 72 hours. C-Diff No components found for: CDIFF Lipase     Component Value Date/Time   LIPASE 26 07/28/2013 1643    Drugs of Abuse     Component Value Date/Time   LABOPIA NONE DETECTED 10/07/2008 0225   COCAINSCRNUR NONE DETECTED 10/07/2008 0225   LABBENZ POSITIVE (A) 10/07/2008 0225   AMPHETMU NONE DETECTED 10/07/2008 0225   THCU NONE DETECTED 10/07/2008 0225   LABBARB  10/07/2008 0225    NONE DETECTED        DRUG SCREEN FOR MEDICAL PURPOSES ONLY.  IF CONFIRMATION IS NEEDED FOR ANY PURPOSE, NOTIFY LAB WITHIN 5 DAYS.        LOWEST DETECTABLE LIMITS FOR URINE DRUG SCREEN Drug Class       Cutoff (ng/mL) Amphetamine      1000 Barbiturate      200 Benzodiazepine   259 Tricyclics       563 Opiates          300 Cocaine          300 THC              50     RADIOLOGY STUDIES: CT Head Wo Contrast  Result Date: 09/14/2020 CLINICAL DATA:  Fall from bed, altered mental status, trauma EXAM: CT HEAD WITHOUT CONTRAST CT CERVICAL SPINE WITHOUT CONTRAST TECHNIQUE: Multidetector CT imaging of the head and cervical spine was performed following the standard protocol without intravenous contrast. Multiplanar CT image reconstructions of the cervical spine were also generated. COMPARISON:  04/03/2012 FINDINGS: CT HEAD FINDINGS Brain: Mild atrophy pattern and white matter microvascular ischemic changes throughout both cerebral hemispheres. No acute intracranial hemorrhage, mass lesion, new infarction, midline shift, herniation, hydrocephalus, or extra-axial fluid collection. No focal mass effect or edema. Cisterns are patent. Cerebellar atrophy as well. Vascular: No hyperdense vessel or unexpected calcification. Skull: Normal. Negative for fracture or focal lesion. Sinuses/Orbits: No acute finding. Other: None. CT CERVICAL SPINE FINDINGS Alignment: Normal. Skull base and  vertebrae: There are acute bilateral nondisplaced fractures of the C1 posterior arch posteriorly, images 20 through 23 of series 5. Anterior arch of C1 appears intact. [place] axial alignment remains intact. Progressive erosive degenerative arthropathy of the C1-2 articulation, often seen with rheumatoid arthritis or CPPD. Odontoid remains intact. Remainder of the cervical spine demonstrates no acute osseous finding. Soft tissues and spinal canal: No prevertebral fluid or swelling. No visible canal hematoma. Disc levels: Large anterior bulky osteophytes throughout the cervical spine as before. Advanced multilevel degenerative change throughout the C-spine with  disc space narrowing, endplate sclerosis and osteophyte formation. Only minor facet arthropathy posteriorly. Preserved vertebral body heights. No focal kyphosis. Upper chest: Mild apical emphysema. Aorta atherosclerotic. Subclavian carotid atherosclerosis also noted. Other: None. IMPRESSION: No acute intracranial abnormality. Acute nondisplaced fractures of the C1 posterior arch bilaterally. Anterior arch of C1 is intact. Progressive erosive arthropathy at C1-2 articulation as seen with rheumatoid arthritis or CPPD. Similar advanced multilevel degenerative changes throughout the cervical spine as detailed above. These results were called by telephone at the time of interpretation on 09/14/2020 at 9:57 am to provider JOSHUA LONG , who verbally acknowledged these results. Electronically Signed   By: Jerilynn Mages.  Shick M.D.   On: 09/14/2020 09:59   CT Cervical Spine Wo Contrast  Result Date: 09/14/2020 CLINICAL DATA:  Fall from bed, altered mental status, trauma EXAM: CT HEAD WITHOUT CONTRAST CT CERVICAL SPINE WITHOUT CONTRAST TECHNIQUE: Multidetector CT imaging of the head and cervical spine was performed following the standard protocol without intravenous contrast. Multiplanar CT image reconstructions of the cervical spine were also generated. COMPARISON:  04/03/2012  FINDINGS: CT HEAD FINDINGS Brain: Mild atrophy pattern and white matter microvascular ischemic changes throughout both cerebral hemispheres. No acute intracranial hemorrhage, mass lesion, new infarction, midline shift, herniation, hydrocephalus, or extra-axial fluid collection. No focal mass effect or edema. Cisterns are patent. Cerebellar atrophy as well. Vascular: No hyperdense vessel or unexpected calcification. Skull: Normal. Negative for fracture or focal lesion. Sinuses/Orbits: No acute finding. Other: None. CT CERVICAL SPINE FINDINGS Alignment: Normal. Skull base and vertebrae: There are acute bilateral nondisplaced fractures of the C1 posterior arch posteriorly, images 20 through 23 of series 5. Anterior arch of C1 appears intact. [place] axial alignment remains intact. Progressive erosive degenerative arthropathy of the C1-2 articulation, often seen with rheumatoid arthritis or CPPD. Odontoid remains intact. Remainder of the cervical spine demonstrates no acute osseous finding. Soft tissues and spinal canal: No prevertebral fluid or swelling. No visible canal hematoma. Disc levels: Large anterior bulky osteophytes throughout the cervical spine as before. Advanced multilevel degenerative change throughout the C-spine with disc space narrowing, endplate sclerosis and osteophyte formation. Only minor facet arthropathy posteriorly. Preserved vertebral body heights. No focal kyphosis. Upper chest: Mild apical emphysema. Aorta atherosclerotic. Subclavian carotid atherosclerosis also noted. Other: None. IMPRESSION: No acute intracranial abnormality. Acute nondisplaced fractures of the C1 posterior arch bilaterally. Anterior arch of C1 is intact. Progressive erosive arthropathy at C1-2 articulation as seen with rheumatoid arthritis or CPPD. Similar advanced multilevel degenerative changes throughout the cervical spine as detailed above. These results were called by telephone at the time of interpretation on  09/14/2020 at 9:57 am to provider JOSHUA LONG , who verbally acknowledged these results. Electronically Signed   By: Jerilynn Mages.  Shick M.D.   On: 09/14/2020 09:59   DG Chest Portable 1 View  Result Date: 09/14/2020 CLINICAL DATA:  Fall from bed. EXAM: PORTABLE CHEST 1 VIEW COMPARISON:  08/09/2019. FINDINGS: Normal heart size and mediastinal contours. CABG. No acute infiltrate or edema. No effusion or pneumothorax. No acute osseous findings. Artifact from EKG leads. IMPRESSION: No active disease. Electronically Signed   By: Monte Fantasia M.D.   On: 09/14/2020 09:30      IMPRESSION:   *    Chronic anemia.  Hgb once again in 6s as was the case in early March when he received 1 PRBC and parenteral iron. Now FOBT positive in setting of daily oral iron.   *    AMS.  Possible overuse of oxycodone.  Improved  at time of my exam.    *    COVID-19 positive.  Marked leukocytosis but no active disease on chest x-ray.   *     hypotension, Leukocytosis w/o fever, meets sepsis criteria.  Cefepime, vancomycin initiated. CXR and U/A unremarkable.    *    elevated BNP 1149.  Hx CAD, remote CABG.  Last echo was 06/2018 w LVEF > 65%.  Severe left and right atrial dilation, degenerative mitral valve with mild to moderate mitral regurg.  Mild aortic valve sclerosis.  Dilated IVC with respiratory variability.  Possible PFO. Current CXR without signs of heart failure.  *   Longstanding cirrhosis of the liver, compensated with normal platelets.  INR elevated at 1.4 but he takes Eliquis.  *    atrial fibrillation.  Chronic Eliquis.  Last dose not yet determined but assume it was taken last night 5/26.  *   Low albumin consistent with malnutrition of unclear severity.    PLAN:     *    At present pt is to chronically and acutely ill for consideration of endoscopic procedures in setting of no overt GI hemorrhaging.  Okay to eat any texture of food/diet as tolerated.  Ordered a soft/HH diet as he lacks teeth and this  will be easier to chew.   Azucena Freed  09/14/2020, 12:23 PM Phone [phone]    I have reviewed the entire case in detail with the above APP and discussed the plan in detail.  Therefore, I agree with the diagnoses recorded above. In addition,  I have personally interviewed and examined the patient.  My additional thoughts are as follows:  This patient has multifactorial anemia of chronic illness and obscure occult GI blood loss.  Multiple complex conditions, now with altered mental status and probable opioid overdose.   I concur with the previous impressions of my colleagues that given this patient's overall condition, the risks of endoscopic procedures outweigh the expected benefits.  I am just meeting him, but this poor man appears truly miserable from his chronic leg pain, multiple other complex conditions and his general debility, and now he is even more so since he is in a neck brace after a fall out of bed.  He is on home hospice, and it seems to me most appropriate to focus on his comfort and trying to improve his quality of life.  I have no plans for endoscopic tests on him, and I recommend that his primary team seriously consider discontinuation of his oral anticoagulation.  At least then he would have less chronic GI blood loss and therefore less chance of requiring hospitalization.  Thank you for consult, we will sign off. Nelida Meuse III Office:[phone]

## 2020-09-15 ENCOUNTER — Inpatient Hospital Stay (HOSPITAL_COMMUNITY)

## 2020-09-15 DIAGNOSIS — G8929 Other chronic pain: Secondary | ICD-10-CM

## 2020-09-15 DIAGNOSIS — A419 Sepsis, unspecified organism: Secondary | ICD-10-CM

## 2020-09-15 LAB — CBC
HCT: 25.9 % — ABNORMAL LOW (ref 39.0–52.0)
Hemoglobin: 8 g/dL — ABNORMAL LOW (ref 13.0–17.0)
MCH: 25.2 pg — ABNORMAL LOW (ref 26.0–34.0)
MCHC: 30.9 g/dL (ref 30.0–36.0)
MCV: 81.7 fL (ref 80.0–100.0)
Platelets: 305 10*3/uL (ref 150–400)
RBC: 3.17 MIL/uL — ABNORMAL LOW (ref 4.22–5.81)
RDW: 19.4 % — ABNORMAL HIGH (ref 11.5–15.5)
WBC: 22.5 10*3/uL — ABNORMAL HIGH (ref 4.0–10.5)
nRBC: 0 % (ref 0.0–0.2)

## 2020-09-15 LAB — URINALYSIS, ROUTINE W REFLEX MICROSCOPIC
Bilirubin Urine: NEGATIVE
Glucose, UA: NEGATIVE mg/dL
Ketones, ur: NEGATIVE mg/dL
Nitrite: NEGATIVE
Protein, ur: 30 mg/dL — AB
Specific Gravity, Urine: 1.018 (ref 1.005–1.030)
WBC, UA: >50 WBC/hpf — ABNORMAL HIGH (ref 0–5)
pH: 5 (ref 5.0–8.0)

## 2020-09-15 LAB — TYPE AND SCREEN
ABO/RH(D): A NEG
Antibody Screen: NEGATIVE
Unit division: 0
Unit division: 0

## 2020-09-15 LAB — BASIC METABOLIC PANEL
Anion gap: 9 (ref 5–15)
BUN: 22 mg/dL (ref 8–23)
CO2: 23 mmol/L (ref 22–32)
Calcium: 7.9 mg/dL — ABNORMAL LOW (ref 8.9–10.3)
Chloride: 104 mmol/L (ref 98–111)
Creatinine, Ser: 1.42 mg/dL — ABNORMAL HIGH (ref 0.61–1.24)
GFR, Estimated: 51 mL/min — ABNORMAL LOW (ref >60)
Glucose, Bld: 116 mg/dL — ABNORMAL HIGH (ref 70–99)
Potassium: 3.3 mmol/L — ABNORMAL LOW (ref 3.5–5.1)
Sodium: 136 mmol/L (ref 135–145)

## 2020-09-15 LAB — BPAM RBC
Blood Product Expiration Date: 202206032359
Blood Product Expiration Date: 202206152359
ISSUE DATE / TIME: 202205271122
ISSUE DATE / TIME: 202205272051
Unit Type and Rh: 600
Unit Type and Rh: 600

## 2020-09-15 LAB — PREALBUMIN: Prealbumin: <5 mg/dL — ABNORMAL LOW (ref 18–38)

## 2020-09-15 MED ORDER — GUAIFENESIN-DM 100-10 MG/5ML PO SYRP
10.0000 mL | ORAL_SOLUTION | Freq: Four times a day (QID) | ORAL | Status: DC | PRN
  Administered 2020-09-15: 10 mL via ORAL
  Filled 2020-09-15: qty 10

## 2020-09-15 MED ORDER — OXYCODONE HCL 5 MG PO TABS
20.0000 mg | ORAL_TABLET | ORAL | Status: DC | PRN
  Administered 2020-09-16 – 2020-09-18 (×4): 20 mg via ORAL
  Filled 2020-09-15 (×5): qty 4

## 2020-09-15 MED ORDER — FINASTERIDE 5 MG PO TABS
5.0000 mg | ORAL_TABLET | Freq: Every day | ORAL | Status: DC
  Administered 2020-09-15 – 2020-09-17 (×3): 5 mg via ORAL
  Filled 2020-09-15 (×4): qty 1

## 2020-09-15 MED ORDER — TAMSULOSIN HCL 0.4 MG PO CAPS
0.4000 mg | ORAL_CAPSULE | Freq: Every day | ORAL | Status: DC
  Administered 2020-09-15 – 2020-09-17 (×3): 0.4 mg via ORAL
  Filled 2020-09-15 (×4): qty 1

## 2020-09-15 MED ORDER — LACTULOSE 10 GM/15ML PO SOLN: 20.0000 g | Freq: Two times a day (BID) | ORAL | Status: DC | PRN

## 2020-09-15 MED ORDER — MORPHINE SULFATE ER 15 MG PO TBCR
15.0000 mg | EXTENDED_RELEASE_TABLET | Freq: Two times a day (BID) | ORAL | Status: DC
Start: 2020-09-15 — End: 2020-09-16
  Administered 2020-09-15: 15 mg via ORAL
  Filled 2020-09-15 (×2): qty 1

## 2020-09-15 MED ORDER — SPIRONOLACTONE 25 MG PO TABS
25.0000 mg | ORAL_TABLET | Freq: Every day | ORAL | Status: DC
  Administered 2020-09-15 – 2020-09-17 (×3): 25 mg via ORAL
  Filled 2020-09-15 (×4): qty 1

## 2020-09-15 MED ORDER — FUROSEMIDE 80 MG PO TABS
80.0000 mg | ORAL_TABLET | Freq: Every day | ORAL | Status: DC
  Administered 2020-09-15 – 2020-09-17 (×3): 80 mg via ORAL
  Filled 2020-09-15 (×4): qty 1

## 2020-09-15 MED ORDER — PANTOPRAZOLE SODIUM 20 MG PO TBEC
20.0000 mg | DELAYED_RELEASE_TABLET | Freq: Two times a day (BID) | ORAL | Status: DC
  Administered 2020-09-15 – 2020-09-18 (×7): 20 mg via ORAL
  Filled 2020-09-15 (×8): qty 1

## 2020-09-15 MED ORDER — MOMETASONE FURO-FORMOTEROL FUM 200-5 MCG/ACT IN AERO
2.0000 | INHALATION_SPRAY | Freq: Two times a day (BID) | RESPIRATORY_TRACT | Status: DC
  Administered 2020-09-15 – 2020-09-18 (×5): 2 via RESPIRATORY_TRACT
  Filled 2020-09-15: qty 8.8

## 2020-09-15 MED ORDER — POTASSIUM CHLORIDE CRYS ER 20 MEQ PO TBCR
40.0000 meq | EXTENDED_RELEASE_TABLET | Freq: Two times a day (BID) | ORAL | Status: AC
  Administered 2020-09-15 – 2020-09-16 (×3): 40 meq via ORAL
  Filled 2020-09-15 (×4): qty 2

## 2020-09-15 NOTE — Progress Notes (Addendum)
[name] Patient Note  [name] is a current hospice patient with a terminal diagnosis of hypertensive heart and chronic kidney disease with heart failure. Daughter called hospice to report patient had been up all night "hollering and having episodes" and had fallen out of the bed a few times. Daughter reported to [facility] RN she was unsure which medications patient may have taken, pill bottles were open, scattered on the floor with tops off and that cola had been spilled on meds or it could have possibly been urine. Patient requested family call 9. Patient evaluated in the ED and is admitted 09/14/20 for symptomatic anemia, hypotension, closed nondisplaced fracture of posterior arch of first cervical vertebra and Covid 19 infection. Patient has shared that he desires to be FULL CODE with [name], PMT. Per Dr. Tomasa Hosteller this is a related hospital admission.  Unable to visit patient at bedside due to Covid 19 infection. Spoke with bedside RN, Idolina Primer for update. She reports patient is doing well, oriented and without complaints at this time.  Phone call to daughter [name]. [name] expresses concerns of patient returning home. She states her husband needs to return to work and she is unable to care for her father by herself. She shares the house is infested with bugs and feels this may be contributing to his poor health. We discussed patient is expressing his desire to return home with hospice services and though she may not feel this is appropriate, he can make his decisions for himself. Spoke with Marthenia Rolling, RN with TOC of above.  Patient remains inpatient appropriate for treatment of infections, monitoring for blood loss.   V/S: 97.6 (oral) 111/66, 75, 19, 99% on room air  Abnormal Lab work: Potassium: 3.3 (L) Glucose: 116 (H) Creatinine: 1.42 (H) Calcium: 7.9 (L) PREALBUMIN: <5 (L) GFR, Estimated: 51 (L) WBC: 22.5 (H) RBC: 3.17  (L) Hemoglobin: 8.0 (L) HCT: 25.9 (L) MCH: 25.2 (L) RDW: 19.4 (H)  No new diagnostics  Medications:: Oxycontin 10 mg Q 12 hours, ceFEPIme (MAXIPIME) 2 g in sodium chloride 0.9 % 100 mL IVPB every 12 hours, LR 75 cc/hr, vancomycin (VANCOREADY) IVPB 1000 mg/200 mL every 24 hours, Fentanyl 25 mcg 2 doses in last 24 hours, oxycodone 15 mg 3 doses in last 24 hours.  1 unit PRBC for Hgb 6.7  Assessment/Plan SIRS/sepsis lower extremity wounds: Acute.  Patient was initially noted to be tachycardic and tachypneic with white blood cell count elevated up to 39.9 with lactic acid 2.8 and initially hypotensive.  At this time unclear source of infection question lower extremity wounds and possible cellulitis. -Admit to a progressive bed -Follow-up blood cultures -Check urinalysis -Continue empiric antibiotics of vancomycin and cefepime -Trend lactic acid levels -Wound care consult for lower extremity wounds  Acute medical encephalopathy: Patient appears to be alert and oriented x4. He had recently been started on morphine  in addition to oxycodone for pain which likely could have been the cause of his alternates.. -Neuro check -Follow-up recommendations by palliative care  Acute blood loss anemia: On admission hemoglobin noted to be 6.7 with positive guaiac stools.  Patient has been seen by gastroenterology earlier this month and risk of EGD were thought to outweigh benefits.  Patient had been transfused 1 unit of packed red blood cells.  -Held Eliquis -Continue to monitor and transfuse blood products as needed for hemoglobin less than 8 g/dL. -Appreciate GI consultative services.  No plans for any endoscopic evaluation at this  time  C1 fracture secondary to fall: Patient appreciated to have a C1 fracture on imaging.  Neurosurgery had been consulted.  Given patient's multiple comorbidities no surgical invention likely to be recommended. -Continue cervical collar -Appreciate neurosurgery  consultative services,  will follow-up for any further recommendations    COVID-19 infection: Incidentally found to be positive for COVID-19.  Patient denies any complaints of cough.  Chest x-ray was otherwise clear and patient was maintaining O2 saturations on room air.. -Airborne precautions  Leukocytosis: WBC initially elevated at 39.9. -Follow-up peripheral smear and discussed results with hematology oncology in a.m. if needed.  Atrial fibrillation on chronic anticoagulation: Patient appears to be rate controlled. -Held Eliquis due to acute bleed  Hypokalemia: Acute. Initial potassium 3.2 -Give 40 mEq of potassium chloride p.o. x1 dose. -Continue to monitor and replace as needed  Protein calorie malnutrition: Suspect likely severe. -Check free albumin in a.m.  DNR: Present on admission patient is on hospice.   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  He is neurologically intact. Currently not in need of surgical intervention. Would recommend cervical spine MRI to assess for any potential lower brainstem and upper spinal cord compression due to the basilar impression. If the patient becomes symptomatic, surgical intervention may be necessary. Continue hard cervical collar. Will need outpatient follow up 1 week after discharge with cervical radiographs.   Discharge Planning: See above  Family contact: spoke with daughter [name], see above  IDT: updated  Goals of care: patient desires to return home with hospice services.   Please use [facility] for any transportation needs as hospice contracts this service for our patients.  Please call with any hospice related questions or concerns.  Thank you. Margaretmary Eddy, BSN, RN [facility]  Liaison [phone]

## 2020-09-15 NOTE — Progress Notes (Signed)
Pt was brought down to MRI and started jerking and moving all over, Pt said he has Cerebral Palsy and can't control his movements. I tried contacting RN but while on phone and Pt started screaming having a fit and refusing to do the exam even with meds. Pt was sent back to floor.

## 2020-09-15 NOTE — TOC CAGE-AID Note (Signed)
Transition of Care [facility]) - CAGE-AID Screening   Patient Details  Name: [name] MRN: [id number] Date of Birth: April 24, 1940  [facility] CM/SW Contact:    Clovis Cao, RN Phone Number: [phone] 09/15/2020, 10:25 AM   Clinical Narrative: Pt here after falling out of bed and is on anticoagulants.  Pt denies alcohol or drug use.   CAGE-AID Screening:    Have You Ever Felt You Ought to Cut Down on Your Drinking or Drug Use?: No Have People Annoyed You By Critizing Your Drinking Or Drug Use?: No Have You Felt Bad Or Guilty About Your Drinking Or Drug Use?: No Have You Ever Had a Drink or Used Drugs First Thing In The Morning to Steady Your Nerves or to Get Rid of a Hangover?: No CAGE-AID Score: 0  Substance Abuse Education Offered: No

## 2020-09-15 NOTE — Progress Notes (Signed)
PROGRESS NOTE    [name]  SEG:[id number] DOB: 1940/10/19 DOA: 09/14/2020 PCP: [name], MD    Brief Narrative:  80 year old gentleman with history of hypertension, hyperlipidemia, chronic atrial fibrillation on anticoagulation, coronary, peripheral arterial disease, chronic lower extremity wound, cirrhosis and history of alcohol abuse presented to the emergency room after he was found fallen out of bed and found confused.  At his baseline patient is bed chair bound and unable to get around without assistant.  He lives with his son and daughter-in-law, they help him around.  He gets around in a wheelchair.  Patient also has bilateral lower extremity wound and followed by wound care and currently has Unna boot. Patient is under hospice care at home, likely for pain management. Patient was started on MS Contin 2 days ago, the night after MS Contin he was confused and laying naked in the bed.  EMS also reported that he was out of his prescribed number of oxycodone.  At the emergency room, his blood pressure was 88/42 heart rate 60 in A. fib.  He was given 500 mL fluid bolus.  WBC count was 39.9, hemoglobin 6.7, lactic acid 2.8.  COVID-19 screening test was positive.  On his trauma series, he was also found to have posterior body of C1 vertebral fracture.  Started on sepsis pathway, treated with antibiotics and admitted to the hospital.  Also received 1 unit of PRBC transfusion.   Assessment & Plan:   Principal Problem:   Sepsis ([place]) Active Problems:   Acute blood loss anemia   Acute metabolic encephalopathy   Fall at home, initial encounter   C1 cervical fracture ([place])   COVID-19 virus infection   Hypokalemia   Protein calorie malnutrition ([place])   Leukocytosis  Sepsis present on admission: Likely due to lower extremity wounds.  Presented with WBC count of 39.9 and lactic acid 2.8 and initially hypotensive.  Adequately responded to resuscitation.  Lactic acid normalized.   Blood pressure normalized. Blood cultures negative so far.  Chest x-ray with no evidence of infection.  Urine is clear. Chronic lower extremity wounds, covering with broad-spectrum antibiotics, will remove Unna boot today with wound care and evaluate for any surgical need, however less likely.  WBC trending down. We will continue vancomycin and cefepime today, anticipate de-escalating by tomorrow if no positive cultures.  We will see how the wound looks like.  Acute metabolic encephalopathy: Improved now.  Nonfocal.  Likely due to COVID infection/MS Contin.  Continue short acting medications and opiates.  Discontinue MS Contin.  Acute on chronic blood loss anemia: Hemoglobin 6.7.  Guaiac positive stools.  Seen by gastroenterology.  No evidence of active bleeding.  GI decided against endoscopic evaluation.  On PPI. Received 1 unit of PRBC transfusion with appropriate response.  He is on therapeutic anticoagulation with Eliquis, will discontinue.  At this time side effects outweigh benefits.  She 1 fracture secondary to fall: On skeletal survey he was found to have posterior elements of a C1 fracture.  No neurological compromise.  Seen by neurosurgery and recommended Aspen collar and mobility.  We will start working with PT OT today.  COVID-19 viral infection: Screening test positive for COVID-19.  Patient without pulmonary symptoms.  Symptomatic treatment.  A. fib on chronic anticoagulation: Rate controlled.  Will discontinue Eliquis.  Goal of care: Enrolled with hospice at home likely for pain management.  He wants to be treated, he wants to be full code.  Palliative care following.  Start  mobilizing with PT OT.    DVT prophylaxis: SCDs Start: 09/14/20 1612   Code Status: Full code Family Communication: Daughter-in-law Ms. [name] on the phone Disposition Plan: Status is: Inpatient  Remains inpatient appropriate because:Inpatient level of care appropriate due to severity of  illness   Dispo: The patient is from: Home              Anticipated d/c is to: Home with home health services              Patient currently is not medically stable to d/c.   Difficult to place patient No         Consultants:   Gastroenterology, neurosurgery, palliative care  Procedures:   None  Antimicrobials:  Antibiotics Given (last 72 hours)    Date/Time Action Medication Dose Rate   09/14/20 1115 New Bag/Given   ceFEPIme (MAXIPIME) 2 g in sodium chloride 0.9 % 100 mL IVPB 2 g 200 mL/hr   09/14/20 1124 New Bag/Given   metroNIDAZOLE (FLAGYL) IVPB 500 mg 500 mg 100 mL/hr   09/14/20 1155 New Bag/Given   vancomycin (VANCOREADY) IVPB 1500 mg/300 mL 1,500 mg 150 mL/hr   09/15/20 0006 New Bag/Given  [Blood admin]   ceFEPIme (MAXIPIME) 2 g in sodium chloride 0.9 % 100 mL IVPB 2 g 200 mL/hr   09/15/20 5188 New Bag/Given   ceFEPIme (MAXIPIME) 2 g in sodium chloride 0.9 % 100 mL IVPB 2 g 200 mL/hr   09/15/20 1127 New Bag/Given   vancomycin (VANCOREADY) IVPB 1000 mg/200 mL 1,000 mg 200 mL/hr         Subjective: Patient seen and examined.  He was sitting at the edge of the bed and eating breakfast.  Denies any complaints.  He tells me that he needs to go home.  [facility] to go [facility].  He wants to live in his house as much as possible.  He thinks he is safe at home.  Denies any neck pain today.  Denies any tingling or numbness. He has some pain, oxycodone helps.  Objective: Vitals:   09/14/20 2355 09/14/20 2359 09/15/20 0306 09/15/20 0831  BP: 129/83   111/66  Pulse: 66 73 71 75  Resp: (!) 22 (!) 22 (!) 24 19  Temp:  97.8 F (36.6 C)  97.6 F (36.4 C)  TempSrc:  Oral  Oral  SpO2: 99%  97% 99%  Weight:      Height:        Intake/Output Summary (Last 24 hours) at 09/15/2020 1318 Last data filed at 09/15/2020 1027 Gross per 24 hour  Intake 2408.14 ml  Output 0 ml  Net 2408.14 ml   Filed Weights   09/14/20 0817  Weight:  79.4 kg    Examination:  General exam: Appears calm and comfortable  Chronically sick looking but not in any distress.  Even on room air today. Respiratory system: Clear to auscultation. Respiratory effort normal. Cardiovascular system: S1 & S2 heard, RRR. No JVD, murmurs, rubs, gallops or clicks. No pedal edema. Gastrointestinal system: Abdomen is nondistended, soft and nontender. No organomegaly or masses felt. Normal bowel sounds heard. Central nervous system: Alert and oriented. No focal neurological deficits.  Moves all extremities. Extremities: Bilateral leg on Unna boot, visible part of the distal foot with some excoriations, 1+ edema.  No evidence of obvious skin or subcutaneous tissue infection.  Will ask wound care to remove the [employer].    Data Reviewed:  I have personally reviewed following labs and imaging studies  CBC: Recent Labs  Lab 09/14/20 0830 09/14/20 0851 09/14/20 1634 09/15/20 0309  WBC 39.9*  --  34.1* 22.5*  NEUTROABS  --   --  31.7*  --   HGB 6.7* 7.1* 7.3* 8.0*  HCT 22.9* 21.0* 23.8* 25.9*  MCV 81.8  --  80.7 81.7  PLT 343  --  330 616   Basic Metabolic Panel: Recent Labs  Lab 09/14/20 0830 09/14/20 0851 09/15/20 0309  NA 135 137 136  K 3.2* 3.2* 3.3*  CL 103 102 104  CO2 21*  --  23  GLUCOSE 116* 112* 116*  BUN 19 19 22   CREATININE 1.79* 1.70* 1.42*  CALCIUM 7.8*  --  7.9*   GFR: Estimated Creatinine Clearance: 45 mL/min (A) (by C-G formula based on SCr of 1.42 mg/dL (H)). Liver Function Tests: Recent Labs  Lab 09/14/20 0830  AST 30  ALT 14  ALKPHOS 118  BILITOT 0.8  PROT 6.0*  ALBUMIN 1.8*   No results for input(s): LIPASE, AMYLASE in the last 168 hours. No results for input(s): AMMONIA in the last 168 hours. Coagulation Profile: Recent Labs  Lab 09/14/20 0830  INR 1.4*   Cardiac Enzymes: No results for input(s): CKTOTAL, CKMB, CKMBINDEX, TROPONINI in the last 168 hours. BNP (last 3 results) No results for input(s):  PROBNP in the last 8760 hours. HbA1C: No results for input(s): HGBA1C in the last 72 hours. CBG: No results for input(s): GLUCAP in the last 168 hours. Lipid Profile: No results for input(s): CHOL, HDL, LDLCALC, TRIG, CHOLHDL, LDLDIRECT in the last 72 hours. Thyroid Function Tests: No results for input(s): TSH, T4TOTAL, FREET4, T3FREE, THYROIDAB in the last 72 hours. Anemia Panel: No results for input(s): VITAMINB12, FOLATE, FERRITIN, TIBC, IRON, RETICCTPCT in the last 72 hours. Sepsis Labs: Recent Labs  Lab 09/14/20 0830 09/14/20 1634  LATICACIDVEN 2.8* 2.1*    Recent Results (from the past 240 hour(s))  Culture, blood (Routine X 2) w Reflex to ID Panel     Status: None (Preliminary result)   Collection Time: 09/14/20  8:30 AM   Specimen: BLOOD  Result Value Ref Range Status   Specimen Description BLOOD SITE NOT SPECIFIED  Final   Special Requests   Final    BOTTLES DRAWN AEROBIC AND ANAEROBIC Blood Culture results may not be optimal due to an inadequate volume of blood received in culture bottles   Culture   Final    NO GROWTH < 24 HOURS Performed at [facility], [place] [address] [place], [place] [address]    Report Status PENDING  Incomplete  Resp Panel by RT-PCR (Flu A&B, Covid) Nasopharyngeal Swab     Status: Abnormal   Collection Time: 09/14/20  9:21 AM   Specimen: Nasopharyngeal Swab; Nasopharyngeal(NP) swabs in vial transport medium  Result Value Ref Range Status   SARS Coronavirus 2 by RT PCR POSITIVE (A) NEGATIVE Final    Comment: RESULT CALLED TO, READ BACK BY AND VERIFIED WITH: RN E DIXON [id number] AT 1147 AM BY CM (NOTE) SARS-CoV-2 target nucleic acids are DETECTED.  The SARS-CoV-2 RNA is generally detectable in upper respiratory specimens during the acute phase of infection. Positive results are indicative of the presence of the identified virus, but do not rule out bacterial infection or co-infection with other pathogens not detected by the test.  Clinical correlation with patient history and other diagnostic information is necessary to determine patient infection status. The expected  result is Negative.  Fact Sheet for Patients: EntrepreneurPulse.com.au  Fact Sheet for Healthcare Providers: IncredibleEmployment.be  This test is not yet approved or cleared by the [place] FDA and  has been authorized for detection and/or diagnosis of SARS-CoV-2 by FDA under an Emergency Use Authorization (EUA).  This EUA will remain in effect (meaning this test can b e used) for the duration of  the COVID-19 declaration under Section 564(b)(1) of the Act, 21 U.S.C. section 360bbb-3(b)(1), unless the authorization is terminated or revoked sooner.     Influenza A by PCR NEGATIVE NEGATIVE Final   Influenza B by PCR NEGATIVE NEGATIVE Final    Comment: (NOTE) The Xpert Xpress SARS-CoV-2/FLU/RSV plus assay is intended as an aid in the diagnosis of influenza from Nasopharyngeal swab specimens and should not be used as a sole basis for treatment. Nasal washings and aspirates are unacceptable for Xpert Xpress SARS-CoV-2/FLU/RSV testing.  Fact Sheet for Patients: EntrepreneurPulse.com.au  Fact Sheet for Healthcare Providers: IncredibleEmployment.be  This test is not yet approved or cleared by the [place] FDA and has been authorized for detection and/or diagnosis of SARS-CoV-2 by FDA under an Emergency Use Authorization (EUA). This EUA will remain in effect (meaning this test can be used) for the duration of the COVID-19 declaration under Section 564(b)(1) of the Act, 21 U.S.C. section 360bbb-3(b)(1), unless the authorization is terminated or revoked.  Performed at [facility], [place] [address] [place], [place] [address]          Radiology Studies: CT Head Wo Contrast  Result Date: 09/14/2020 CLINICAL DATA:  Fall from bed, altered mental status,  trauma EXAM: CT HEAD WITHOUT CONTRAST CT CERVICAL SPINE WITHOUT CONTRAST TECHNIQUE: Multidetector CT imaging of the head and cervical spine was performed following the standard protocol without intravenous contrast. Multiplanar CT image reconstructions of the cervical spine were also generated. COMPARISON:  04/03/2012 FINDINGS: CT HEAD FINDINGS Brain: Mild atrophy pattern and white matter microvascular ischemic changes throughout both cerebral hemispheres. No acute intracranial hemorrhage, mass lesion, new infarction, midline shift, herniation, hydrocephalus, or extra-axial fluid collection. No focal mass effect or edema. Cisterns are patent. Cerebellar atrophy as well. Vascular: No hyperdense vessel or unexpected calcification. Skull: Normal. Negative for fracture or focal lesion. Sinuses/Orbits: No acute finding. Other: None. CT CERVICAL SPINE FINDINGS Alignment: Normal. Skull base and vertebrae: There are acute bilateral nondisplaced fractures of the C1 posterior arch posteriorly, images 20 through 23 of series 5. Anterior arch of C1 appears intact. [place] axial alignment remains intact. Progressive erosive degenerative arthropathy of the C1-2 articulation, often seen with rheumatoid arthritis or CPPD. Odontoid remains intact. Remainder of the cervical spine demonstrates no acute osseous finding. Soft tissues and spinal canal: No prevertebral fluid or swelling. No visible canal hematoma. Disc levels: Large anterior bulky osteophytes throughout the cervical spine as before. Advanced multilevel degenerative change throughout the C-spine with disc space narrowing, endplate sclerosis and osteophyte formation. Only minor facet arthropathy posteriorly. Preserved vertebral body heights. No focal kyphosis. Upper chest: Mild apical emphysema. Aorta atherosclerotic. Subclavian carotid atherosclerosis also noted. Other: None. IMPRESSION: No acute intracranial abnormality. Acute nondisplaced fractures of the C1 posterior  arch bilaterally. Anterior arch of C1 is intact. Progressive erosive arthropathy at C1-2 articulation as seen with rheumatoid arthritis or CPPD. Similar advanced multilevel degenerative changes throughout the cervical spine as detailed above. These results were called by telephone at the time of interpretation on 09/14/2020 at 9:57 am to provider JOSHUA LONG , who verbally acknowledged these results.  Electronically Signed   By: Jerilynn Mages.  Shick M.D.   On: 09/14/2020 09:59   CT Cervical Spine Wo Contrast  Result Date: 09/14/2020 CLINICAL DATA:  Fall from bed, altered mental status, trauma EXAM: CT HEAD WITHOUT CONTRAST CT CERVICAL SPINE WITHOUT CONTRAST TECHNIQUE: Multidetector CT imaging of the head and cervical spine was performed following the standard protocol without intravenous contrast. Multiplanar CT image reconstructions of the cervical spine were also generated. COMPARISON:  04/03/2012 FINDINGS: CT HEAD FINDINGS Brain: Mild atrophy pattern and white matter microvascular ischemic changes throughout both cerebral hemispheres. No acute intracranial hemorrhage, mass lesion, new infarction, midline shift, herniation, hydrocephalus, or extra-axial fluid collection. No focal mass effect or edema. Cisterns are patent. Cerebellar atrophy as well. Vascular: No hyperdense vessel or unexpected calcification. Skull: Normal. Negative for fracture or focal lesion. Sinuses/Orbits: No acute finding. Other: None. CT CERVICAL SPINE FINDINGS Alignment: Normal. Skull base and vertebrae: There are acute bilateral nondisplaced fractures of the C1 posterior arch posteriorly, images 20 through 23 of series 5. Anterior arch of C1 appears intact. [place] axial alignment remains intact. Progressive erosive degenerative arthropathy of the C1-2 articulation, often seen with rheumatoid arthritis or CPPD. Odontoid remains intact. Remainder of the cervical spine demonstrates no acute osseous finding. Soft tissues and spinal canal: No  prevertebral fluid or swelling. No visible canal hematoma. Disc levels: Large anterior bulky osteophytes throughout the cervical spine as before. Advanced multilevel degenerative change throughout the C-spine with disc space narrowing, endplate sclerosis and osteophyte formation. Only minor facet arthropathy posteriorly. Preserved vertebral body heights. No focal kyphosis. Upper chest: Mild apical emphysema. Aorta atherosclerotic. Subclavian carotid atherosclerosis also noted. Other: None. IMPRESSION: No acute intracranial abnormality. Acute nondisplaced fractures of the C1 posterior arch bilaterally. Anterior arch of C1 is intact. Progressive erosive arthropathy at C1-2 articulation as seen with rheumatoid arthritis or CPPD. Similar advanced multilevel degenerative changes throughout the cervical spine as detailed above. These results were called by telephone at the time of interpretation on 09/14/2020 at 9:57 am to provider JOSHUA LONG , who verbally acknowledged these results. Electronically Signed   By: Jerilynn Mages.  Shick M.D.   On: 09/14/2020 09:59   DG Chest Portable 1 View  Result Date: 09/14/2020 CLINICAL DATA:  Fall from bed. EXAM: PORTABLE CHEST 1 VIEW COMPARISON:  08/09/2019. FINDINGS: Normal heart size and mediastinal contours. CABG. No acute infiltrate or edema. No effusion or pneumothorax. No acute osseous findings. Artifact from EKG leads. IMPRESSION: No active disease. Electronically Signed   By: Monte Fantasia M.D.   On: 09/14/2020 09:30        Scheduled Meds: . DULoxetine  60 mg Oral Daily  . morphine  15 mg Oral Q12H  . potassium chloride  40 mEq Oral BID  . sodium chloride flush  3 mL Intravenous Q12H   Continuous Infusions: . ceFEPime (MAXIPIME) IV 2 g (09/15/20 0927)  . lactated ringers 1,000 mL (09/14/20 1738)  . vancomycin 1,000 mg (09/15/20 1127)     LOS: 1 day    Time spent: 35 minutes    Barb Merino, MD [facility] Pager [phone]

## 2020-09-16 LAB — BASIC METABOLIC PANEL
Anion gap: 8 (ref 5–15)
BUN: 22 mg/dL (ref 8–23)
CO2: 20 mmol/L — ABNORMAL LOW (ref 22–32)
Calcium: 7.9 mg/dL — ABNORMAL LOW (ref 8.9–10.3)
Chloride: 107 mmol/L (ref 98–111)
Creatinine, Ser: 1.26 mg/dL — ABNORMAL HIGH (ref 0.61–1.24)
GFR, Estimated: 59 mL/min — ABNORMAL LOW (ref >60)
Glucose, Bld: 104 mg/dL — ABNORMAL HIGH (ref 70–99)
Potassium: 3.7 mmol/L (ref 3.5–5.1)
Sodium: 135 mmol/L (ref 135–145)

## 2020-09-16 LAB — CBC WITH DIFFERENTIAL/PLATELET
Abs Immature Granulocytes: 0.09 10*3/uL — ABNORMAL HIGH (ref 0.00–0.07)
Basophils Absolute: 0.1 10*3/uL (ref 0.0–0.1)
Basophils Relative: 1 %
Eosinophils Absolute: 0.1 10*3/uL (ref 0.0–0.5)
Eosinophils Relative: 1 %
HCT: 27.5 % — ABNORMAL LOW (ref 39.0–52.0)
Hemoglobin: 8.3 g/dL — ABNORMAL LOW (ref 13.0–17.0)
Immature Granulocytes: 1 %
Lymphocytes Relative: 5 %
Lymphs Abs: 0.5 10*3/uL — ABNORMAL LOW (ref 0.7–4.0)
MCH: 24.9 pg — ABNORMAL LOW (ref 26.0–34.0)
MCHC: 30.2 g/dL (ref 30.0–36.0)
MCV: 82.3 fL (ref 80.0–100.0)
Monocytes Absolute: 0.6 10*3/uL (ref 0.1–1.0)
Monocytes Relative: 5 %
Neutro Abs: 9.5 10*3/uL — ABNORMAL HIGH (ref 1.7–7.7)
Neutrophils Relative %: 87 %
Platelets: 226 10*3/uL (ref 150–400)
RBC: 3.34 MIL/uL — ABNORMAL LOW (ref 4.22–5.81)
RDW: 19.6 % — ABNORMAL HIGH (ref 11.5–15.5)
WBC: 10.8 10*3/uL — ABNORMAL HIGH (ref 4.0–10.5)
nRBC: 0 % (ref 0.0–0.2)

## 2020-09-16 LAB — PHOSPHORUS: Phosphorus: 2.8 mg/dL (ref 2.5–4.6)

## 2020-09-16 LAB — MAGNESIUM: Magnesium: 1.6 mg/dL — ABNORMAL LOW (ref 1.7–2.4)

## 2020-09-16 MED ORDER — VANCOMYCIN HCL 1250 MG/250ML IV SOLN
1250.0000 mg | INTRAVENOUS | Status: DC
  Administered 2020-09-16 – 2020-09-17 (×2): 1250 mg via INTRAVENOUS
  Filled 2020-09-16 (×2): qty 250

## 2020-09-16 MED ORDER — MAGNESIUM SULFATE 2 GM/50ML IV SOLN
2.0000 g | Freq: Once | INTRAVENOUS | Status: AC
  Administered 2020-09-16: 2 g via INTRAVENOUS
  Filled 2020-09-16: qty 50

## 2020-09-16 NOTE — Progress Notes (Signed)
Pt hollered help me, help me most of the day. Given pain meds for leg pain and neck pain. Pt stated "I want to die. Help me" in the evening. He has not eaten anything since 9am just sip of waters. He refused to eat anything when Nursing served it. VSS.Monitoring pt closely.

## 2020-09-16 NOTE — Progress Notes (Signed)
[facility] - Closed [address] [facility]) Hospitalized Hospice Patient  [name] is acurrent hospice patient with a terminal diagnosis of hypertensive heart disease, chronic kidney disease with heart failure. Daughter called hospice to report patient had been up all night "hollering and having episodes" and had fallen out of the bed a few times. Daughter reported to [facility] RN she was unsure which medications patient may have taken, pill bottles were open, scattered on the floor with tops off and that cola had been spilled on meds or it could have possibly been urine. Patient requested family call 47. Patient evaluated in the ED and is admitted 09/14/20 for symptomatic anemia, hypotension, closed nondisplaced fracture of posterior arch of first cervical vertebra and Covid 19 infection. Patient has shared that he desires to be FULL CODE with [name], PMT. Per Dr. Tomasa Hosteller this is a related hospital admission.  Unable to visit at the bedside due to COVID 19 restrictions. Attempted to call his daughter-in-law [name] several times with no answer and no option to leave voicemail. Per [facility] SW, who had spoken with [name] today, she does not feel like they can adequately care for [name] in his current state. She cites unsanitary living conditions in his room (infestation of roaches and a possible rat) as to why he should not return. [facility] SW attempted to elicit if this discussion had been had with [name], and [name] states it has but he is alert and oriented, it is his home that she is living in and feels like they have no other options.   V/S: 97.8 oral, 105/57, HR 75, RR 18, 91% on RA I&O: 1799/2000 Labs: mag 1.6, gfr 59, Cr 1.29, WBC 10.8, RBC 3.34, hgb 8.3, HCT 27.5 Diagnostics: none IVs/PRNs:  (within the last 12 hours) LR @ 75 ml/hr, magnesium 2g IV x 2 doses, vancomycin 1250 mg IV QD, fentanyl 25 mcg IV x 2, oxycdone IR 20 mg PO x 2   Problem List: SIRS/sepsislower  extremity wounds: Acute.Patient was initially noted to be tachycardic and tachypneic with white blood cell count elevated up to 39.9with lactic acid 2.8 and initially hypotensive. At this time unclear source of infection question lower extremity wounds and possible cellulitis. -Admit to a progressive bed -Follow-up blood cultures -Check urinalysis -Continue empiric antibiotics of vancomycin and cefepime -Trend lactic acid levels -Wound care consult for lower extremity wounds  Acute medical encephalopathy: Patient appears to be alert and oriented x4.Hehad recently been started on morphine in addition to oxycodone for pain which likely could have been the cause of his alternates.  Acute blood loss anemia: On admission hemoglobin noted to be 6.7 with positive guaiac stools. Patient has been seen by gastroenterology earlier this month and risk of EGD were thought to outweigh benefits. Patient had been transfused 1 unit of packed red blood cells.  -Held Eliquis -Continue to monitor and transfuse blood products as needed for hemoglobin less than 8 g/dL. -Appreciate GI consultative services. No plans for any endoscopic evaluation at this time  C1 fracture secondary to fall: Patient appreciated to have a C1 fracture on imaging. Neurosurgery had been consulted. Given patient's multiple comorbidities no surgical invention likely to be recommended. -Continue cervical collar -Appreciate neurosurgery consultative services,will follow-up for any further recommendations  COVID-19 infection:Incidentally found to be positive for COVID-19. Patient denies any complaints of cough. Chest x-ray was otherwise clear and patient was maintaining O2 saturations on room air.. -Airborne precautions  Hypomagnesemia - repleted   GOC: Ongoing.  Pt is a full code and wants full scope of treatment. D/C planning: Ongoing. Pt is from his own home, his son and DIL live with him. They have concerns about him  returning home and the conditions to which he was living. Unable to reach his family today. IDT: Hospice team updated. Family: None in room, attempted to call to update.  Venia Carbon RN, BSN, [facility] Liaison

## 2020-09-16 NOTE — Progress Notes (Addendum)
Pharmacy Antibiotic Note  [name] is a 80 y.o. male admitted on 09/14/2020 with sepsis.  Pharmacy has been consulted for vancomycin dosing. The patient is afebrile. WBC improved to 10.8. Scr has come down to 1.26. Per Dr. Sloan Leiter, the patient has chornic lower extremity wounds, with concern for infection. The patient's renal function has improved, will adjust vancomycin dosing accordingly.  Plan: -Adjust vancomycin IV to 1250 mg q24h (estimated AUC 470, goal 400-550) -Obtain vancomycin peak/trough at steady state -Monitor renal function, clinical status, and cultures  Height: 5\' 10"  (177.8 cm) Weight: 79.4 kg (175 lb) IBW/kg (Calculated) : 73  Temp (24hrs), Avg:97.6 F (36.4 C), Min:97.3 F (36.3 C), Max:97.8 F (36.6 C)  Recent Labs  Lab 09/14/20 0830 09/14/20 0851 09/14/20 1634 09/15/20 0309 09/16/20 0511  WBC 39.9*  --  34.1* 22.5* 10.8*  CREATININE 1.79* 1.70*  --  1.42* 1.26*  LATICACIDVEN 2.8*  --  2.1*  --   --     Estimated Creatinine Clearance: 50.7 mL/min (A) (by C-G formula based on SCr of 1.26 mg/dL (H)).    Allergies  Allergen Reactions  . Doxycycline   . Gabapentin Other (See Comments)    Pt states make him go down to the floor and can not get up  . Amlodipine Nausea And Vomiting and Other (See Comments)    dizziness  . Fish Allergy Nausea And Vomiting    STATES HE HAS NOT EATEN ANY FISH INCLUDING SHELLFISH FOR PAST 40 YRS - IT CAUSED NAUSEA  . Hydrocodone Itching and Rash  . Other Nausea And Vomiting    STATES HE HAS NOT EATEN ANY FISH INCLUDING SHELLFISH FOR PAST 40 YRS - IT CAUSED NAUSEA  . Tylenol [Acetaminophen] Other (See Comments)    Liver problems    Antimicrobials this admission: Vancomycin 5/27>> Cefepime 5/27>>5/28 Flagyl x 1 5/27  Dose adjustments this admission: 5/29: Vancomycin IV 1000 mg q24h > 1250 mg q24h  Microbiology results: 5/27 Bcx NGTD 5/27 COVID positive  Thank you for allowing pharmacy to be a part of this  patient's care.  Shauna Hugh, PharmD, [place]  PGY-1 Pharmacy Resident 09/16/2020 10:42 AM  Please check [email or web address] for unit-specific pharmacy phone numbers.

## 2020-09-16 NOTE — Evaluation (Signed)
Physical Therapy Evaluation Patient Details Name: [name] MRN: [id number] DOB: [birth year] Today's Date: 09/16/2020   History of Present Illness  Pt is a 80 y/o male admitted secondary to falls at home and AMS. Pt found to have sepsis and a C1 fracture. Neurosurgery was consulted and recommended a hard collar at all times. PMH including but not limited to hypertension, hyperlipidemia, atrial fibrillation on anticoagulation, CAD, PVD, iron deficiency anemia, BPH, cirrhosis and alcohol abuse.    Clinical Impression  Pt presented supine in bed with HOB elevated, awake and willing to participate in therapy session. Of note, pt was a very poor historian with no family/caregivers present to confirm or deny information provided. Pt reported that he lives at home with his son and daughter-in-law. He stated that he uses a wheelchair for mobility and requires assistance with transfers. At the time of evaluation, pt required moderate assistance for all bed mobility, maximal assistance to don his hard cervical collar and close min guard to maintain an upright sitting position at the EOB. Session also limited as an IV team member entered room and stated that pt had a STAT IV order and therefore needed to lie back down. Based on pt's current functional mobility and cognitive status, would recommend that pt d/c to a SNF for further intensive therapy services prior to returning home with family support. Pt would continue to benefit from skilled physical therapy services at this time while admitted and after d/c to address the below listed limitations in order to improve overall safety and independence with functional mobility.     Follow Up Recommendations SNF    Equipment Recommendations  None recommended by PT    Recommendations for Other Services       Precautions / Restrictions Precautions Precautions: Fall Restrictions Weight Bearing Restrictions: No      Mobility  Bed Mobility Overal bed  mobility: Needs Assistance Bed Mobility: Rolling;Sidelying to Sit;Sit to Sidelying Rolling: Mod assist Sidelying to sit: Mod assist     Sit to sidelying: Mod assist General bed mobility comments: increased time and effort needed, HOB elevated to achieve upright sitting, assistance needed for bilateral LE movement off of and back onto bed, assistance needed for trunk management    Transfers                 General transfer comment: unable to assess secondary to IV team member entering room and stating that a STAT IV was ordered for the pt  Ambulation/Gait             General Gait Details: unable to assess secondary to IV team member entering room and stating that a STAT IV was ordered for the pt  Stairs            Wheelchair Mobility    Modified Rankin (Stroke Patients Only)       Balance Overall balance assessment: Needs assistance;History of Falls Sitting-balance support: Feet supported;Bilateral upper extremity supported;Single extremity supported Sitting balance-Leahy Scale: Poor                                       Pertinent Vitals/Pain Pain Assessment: Faces Faces Pain Scale: Hurts even more Pain Location: "my balls hurt" Pain Descriptors / Indicators: Grimacing;Guarding Pain Intervention(s): Monitored during session;Repositioned    Home Living Family/patient expects to be discharged to:: Private residence Living Arrangements: Children;Other relatives Available Help at Discharge: Family;Available  PRN/intermittently           Home Equipment: Wheelchair - manual Additional Comments: unable to obtain a full history as pt was a very poor historian and confused throughout    Prior Function Level of Independence: Needs assistance   Gait / Transfers Assistance Needed: pt reported that he uses a w/c for mobility and stated that he sometimes needs someone else to propel him, never answered whether he could propel himself or not. he  also stated that he required assistance with transfers  ADL's / Homemaking Assistance Needed: did not answer when asked about assistance needed for ADLs        Hand Dominance        Extremity/Trunk Assessment   Upper Extremity Assessment Upper Extremity Assessment: Generalized weakness    Lower Extremity Assessment Lower Extremity Assessment: Difficult to assess due to impaired cognition;RLE deficits/detail;LLE deficits/detail RLE Deficits / Details: Curlex was donned to bilateral LEs from his knees to his toes secondary to chronic wounds LLE Deficits / Details: Curlex was donned to bilateral LEs from his knees to his toes secondary to chronic wounds    Cervical / Trunk Assessment Cervical / Trunk Assessment: Other exceptions Cervical / Trunk Exceptions: pt with a C1 fracture with Neurosurgery recommending hard collar at all times; of note, pt with hard collar off in bed upon PT arrival and no understanding of cervical precautions  Communication   Communication: No difficulties  Cognition Arousal/Alertness: Awake/alert Behavior During Therapy: Restless Overall Cognitive Status: Impaired/Different from baseline Area of Impairment: Orientation;Attention;Memory;Following commands;Safety/judgement;Awareness;Problem solving                 Orientation Level: Disoriented to;Time;Situation Current Attention Level: Sustained Memory: Decreased short-term memory Following Commands: Follows one step commands inconsistently;Follows one step commands with increased time Safety/Judgement: Decreased awareness of deficits;Decreased awareness of safety Awareness: Intellectual Problem Solving: Slow processing;Decreased initiation;Difficulty sequencing;Requires verbal cues        General Comments      Exercises     Assessment/Plan    PT Assessment Patient needs continued PT services  PT Problem List Decreased strength;Decreased range of motion;Decreased activity  tolerance;Decreased balance;Decreased mobility;Decreased coordination;Decreased cognition;Decreased knowledge of use of DME;Decreased safety awareness;Decreased knowledge of precautions;Pain       PT Treatment Interventions      PT Goals (Current goals can be found in the Care Plan section)  Acute Rehab PT Goals Patient Stated Goal: none stated PT Goal Formulation: Patient unable to participate in goal setting Time For Goal Achievement: 09/30/20 Potential to Achieve Goals: Fair    Frequency Min 2X/week   Barriers to discharge        Co-evaluation               AM-PAC PT "6 Clicks" Mobility  Outcome Measure Help needed turning from your back to your side while in a flat bed without using bedrails?: A Lot Help needed moving from lying on your back to sitting on the side of a flat bed without using bedrails?: A Lot Help needed moving to and from a bed to a chair (including a wheelchair)?: A Lot Help needed standing up from a chair using your arms (e.g., wheelchair or bedside chair)?: A Lot Help needed to walk in hospital room?: Total Help needed climbing 3-5 steps with a railing? : Total 6 Click Score: 10    End of Session   Activity Tolerance: Patient tolerated treatment well Patient left: in bed;with call bell/phone within reach;with bed alarm set;Other (  comment) (hard collar donned) Nurse Communication: Mobility status PT Visit Diagnosis: Other abnormalities of gait and mobility (R26.89)    Time: 1030-1056 PT Time Calculation (min) (ACUTE ONLY): 26 min   Charges:   PT Evaluation $PT Eval Moderate Complexity: 1 Mod PT Treatments $Therapeutic Activity: 8-22 mins        Anastasio Champion, DPT  Acute Rehabilitation Services Pager [phone] Office [place] 09/16/2020, 2:25 PM

## 2020-09-16 NOTE — Progress Notes (Signed)
Patient ID: [name], male   DOB: Jun 06, 1940, 80 y.o.   MRN: [id number]       Brief Progress Note:  HPI/Patient Profile: 80 y.o. male  with past medical history of COPD, chronic diastolic heart failure, iron deficiency anemia, chronic pain, atrial fibrillation on anticoagulation, CAD s/p CABG, PVD, BPH, and cirrhosis. He presented to the emergency department on 09/14/2020 after EMS was called for altered mental status. The patient's daughter-in-law showed EMS videos of the patient "talking out of his head" last night. EMS reported patient was found naked in his bed with poor hygiene. EMS was also concerned about patient's bottle of oxycodone was filled 5/26 with 90 pills, but only had 40 pills left in the bottle.  ED Course: Patient was alert and oriented x 4 per admitting physician. CT head and cervical spine was significant for nondisplaced fractures of C1 posterior arch bilaterally. Labs significant for WBC 39.9, hemoglobin 6.7, platelet count 343, potassium 3.2, BUN 19, creatinine 1.79, calcium 7.8, albumin 1.8, BNP 1149, and lactic acid 2.8. Chest x-ray showed no acute abnormality. COVID-19 positive. Sepsis protocol was initiated. Patient was transfused 1 unit PRBCs. Admitted to [facility] for sepsis, possible cellulitis, C1 fracture secondary to fall, acute blood loss anemia, and COVID-19 infection.    Subjective: I spoke with [facility] hospice liaison Olivia Mackie by phone to update her on my discussion yesterday with [name]. I also clarified patient's current home pain medications, which had been recently adjusted by the hospice provider.  Plan:  Start MS Contin 15 mg BID  Discontinue Oxycontin  Increase Oxycodone IR to 20 mg every 4 hours as needed for breakthrough pain.   Continue duloxetine 60 mg daily    [name], NP-C Palliative Medicine   Please call Palliative Medicine team phone with any questions [phone]. For individual providers please see AMION.   15 minutes

## 2020-09-16 NOTE — Progress Notes (Signed)
PROGRESS NOTE    [name]  LKT:[id number] DOB: 08/09/1940 DOA: 09/14/2020 PCP: [name], MD    Brief Narrative:  80 year old gentleman with history of hypertension, hyperlipidemia, chronic atrial fibrillation on anticoagulation, coronary, peripheral arterial disease, chronic lower extremity wound, cirrhosis and history of alcohol abuse presented to the emergency room after he was found fallen out of bed and found confused.  At his baseline patient is wheel chair bound and unable to get around without assistant.  He lives with his son and daughter-in-law, they help him around.  He gets around in a wheelchair.  Patient also has bilateral lower extremity wound and followed by wound care and currently has Unna boot. Patient is under hospice care at home, likely for pain management.  Patient was started on MS Contin 2 days ago, the night after MS Contin he was confused and laying naked in the bed.  EMS also reported that he was out of his prescribed number of oxycodone.  At the emergency room, his blood pressure was 88/42 heart rate 60 in A. fib.  He was given 500 mL fluid bolus.  WBC count was 39.9, hemoglobin 6.7, lactic acid 2.8.  COVID-19 screening test was positive.  On his trauma series, he was also found to have posterior body of C1 vertebral fracture.  Started on sepsis pathway, treated with antibiotics and admitted to the hospital.  Also received 1 unit of PRBC transfusion.   Assessment & Plan:   Principal Problem:   Sepsis ([place]) Active Problems:   Acute blood loss anemia   Acute metabolic encephalopathy   Fall at home, initial encounter   C1 cervical fracture ([place])   COVID-19 virus infection   Hypokalemia   Protein calorie malnutrition ([place])   Leukocytosis  Sepsis present on admission: Likely due to lower extremity wounds.  Presented with WBC count of 39.9 and lactic acid 2.8 and initially hypotensive.  Adequately responded to resuscitation.  Lactic acid  normalized.  Blood pressure normalized. Blood cultures negative so far.  Chest x-ray with no evidence of infection.  Urine is clear. Right medial ankle wound looks infected with some subcutaneous collection.  Continue vancomycin.  Discontinue cefepime.  We will check MRI of the right ankle.  May need surgical intervention. Also has multiple wounds on both legs but less likely infected.  Acute metabolic encephalopathy: Improved now.  Nonfocal.  Likely due to COVID infection/MS Contin.  Continue short acting medications and opiates. Patient had improved mentation on 5/28, more impulsive today after receiving MS Contin.  We will discontinue MS Contin today. He is already on a high dose oxycodone, fentanyl for procedures and dressing changes.  We will continue oxycodone. Appreciate palliative involvement.  Acute on chronic blood loss anemia: Hemoglobin 6.7.  Guaiac positive stools.  Seen by gastroenterology.  No evidence of active bleeding.  GI decided against endoscopic evaluation.  On PPI. Received 1 unit of PRBC transfusion with appropriate response.  He is on therapeutic anticoagulation with Eliquis, will discontinue.  At this time side effects outweigh benefits.  Cervical 1 fracture secondary to fall: On skeletal survey he was found to have posterior elements of a C1 fracture.  No neurological compromise.  Seen by neurosurgery and recommended Aspen collar and mobility.  We will start working with PT OT today.  COVID-19 viral infection: Screening test positive for COVID-19.  Patient without pulmonary symptoms.  Symptomatic treatment.  A. fib on chronic anticoagulation: Rate controlled.  Will discontinue Eliquis.  Goal of care:  Enrolled with hospice at home likely for pain management.  He wants to be treated, he wants to be full code.  Palliative care following.  Start mobilizing with PT OT.    DVT prophylaxis: SCDs Start: 09/14/20 1612   Code Status: Full code Family Communication:  Daughter-in-law Ms. [name] on the phone 5/28. Disposition Plan: Status is: Inpatient  Remains inpatient appropriate because:Inpatient level of care appropriate due to severity of illness   Dispo: The patient is from: Home              Anticipated d/c is to: Home with home health services              Patient currently is not medically stable to d/c.   Difficult to place patient No         Consultants:   Gastroenterology, neurosurgery, palliative care  Procedures:   None  Antimicrobials:  Antibiotics Given (last 72 hours)    Date/Time Action Medication Dose Rate   09/14/20 1115 New Bag/Given   ceFEPIme (MAXIPIME) 2 g in sodium chloride 0.9 % 100 mL IVPB 2 g 200 mL/hr   09/14/20 1124 New Bag/Given   metroNIDAZOLE (FLAGYL) IVPB 500 mg 500 mg 100 mL/hr   09/14/20 1155 New Bag/Given   vancomycin (VANCOREADY) IVPB 1500 mg/300 mL 1,500 mg 150 mL/hr   09/15/20 0006 New Bag/Given  [Blood admin]   ceFEPIme (MAXIPIME) 2 g in sodium chloride 0.9 % 100 mL IVPB 2 g 200 mL/hr   09/15/20 1610 New Bag/Given   ceFEPIme (MAXIPIME) 2 g in sodium chloride 0.9 % 100 mL IVPB 2 g 200 mL/hr   09/15/20 1127 New Bag/Given   vancomycin (VANCOREADY) IVPB 1000 mg/200 mL 1,000 mg 200 mL/hr   09/15/20 2322 New Bag/Given   ceFEPIme (MAXIPIME) 2 g in sodium chloride 0.9 % 100 mL IVPB 2 g 200 mL/hr         Subjective: Patient seen and examined.  Was complaining of pain.  He was slightly impulsive and keeps repeating same sentence again again today.  He had clear mentation yesterday on my evaluation.  He had just received MS-Contin. Wound care team has not been able to see his Unna boot, we removed both dressings at the bedside.  Right medial ankle wound looks infected, there is some subcutaneous collection and a skin changes.  Patient complains of severe pain on mobility.  Objective: Vitals:   09/15/20 1612 09/15/20 1939 09/15/20 2015 09/16/20 0828  BP: 116/72  (!) 112/42 117/71  Pulse: 85  80  79  Resp: (!) 22  (!) 22 17  Temp: (!) 97.3 F (36.3 C)  97.6 F (36.4 C) 97.8 F (36.6 C)  TempSrc: Oral  Axillary Oral  SpO2: 96% 96% 100% 91%  Weight:      Height:        Intake/Output Summary (Last 24 hours) at 09/16/2020 1220 Last data filed at 09/16/2020 0500 Gross per 24 hour  Intake 1559.54 ml  Output 2000 ml  Net -440.46 ml   Filed Weights   09/14/20 0817  Weight: 79.4 kg    Examination:  General exam: Appears anxious and impulsive.  Moderate pain on mobility.  On room air.  Chronically sick looking.  Frail and debilitated. Respiratory system: Clear to auscultation. Respiratory effort normal. Cardiovascular system: S1 & S2 heard, RRR. No JVD, murmurs, rubs, gallops or clicks.  Gastrointestinal system: Abdomen is nondistended, soft and nontender. No organomegaly or masses felt. Normal bowel sounds heard. Central nervous  system: Alert and oriented. No focal neurological deficits.  Moves all extremities. Extremities:  Removal of bilateral foot dressing, he has a small healing ulcer on the left lateral aspect, another pinpoint ulcer on the left anterior shin and rest of the foot with chronic ischemic changes but no evidence of underlying infection. Right medial ankle has a dime sized wound, underlying induration and peripheral fluctuation. Chronic ischemic changes and poorly palpable pulses.  Pictures taken and uploaded in the chart.        Data Reviewed: I have personally reviewed following labs and imaging studies  CBC: Recent Labs  Lab 09/14/20 0830 09/14/20 0851 09/14/20 1634 09/15/20 0309 09/16/20 0511  WBC 39.9*  --  34.1* 22.5* 10.8*  NEUTROABS  --   --  31.7*  --  9.5*  HGB 6.7* 7.1* 7.3* 8.0* 8.3*  HCT 22.9* 21.0* 23.8* 25.9* 27.5*  MCV 81.8  --  80.7 81.7 82.3  PLT 343  --  330 305 270   Basic Metabolic Panel: Recent Labs  Lab 09/14/20 0830 09/14/20 0851 09/15/20 0309 09/16/20 0511  NA 135 137 136 135  K 3.2* 3.2* 3.3* 3.7  CL 103 102  104 107  CO2 21*  --  23 20*  GLUCOSE 116* 112* 116* 104*  BUN 19 19 22 22   CREATININE 1.79* 1.70* 1.42* 1.26*  CALCIUM 7.8*  --  7.9* 7.9*  MG  --   --   --  1.6*  PHOS  --   --   --  2.8   GFR: Estimated Creatinine Clearance: 50.7 mL/min (A) (by C-G formula based on SCr of 1.26 mg/dL (H)). Liver Function Tests: Recent Labs  Lab 09/14/20 0830  AST 30  ALT 14  ALKPHOS 118  BILITOT 0.8  PROT 6.0*  ALBUMIN 1.8*   No results for input(s): LIPASE, AMYLASE in the last 168 hours. No results for input(s): AMMONIA in the last 168 hours. Coagulation Profile: Recent Labs  Lab 09/14/20 0830  INR 1.4*   Cardiac Enzymes: No results for input(s): CKTOTAL, CKMB, CKMBINDEX, TROPONINI in the last 168 hours. BNP (last 3 results) No results for input(s): PROBNP in the last 8760 hours. HbA1C: No results for input(s): HGBA1C in the last 72 hours. CBG: No results for input(s): GLUCAP in the last 168 hours. Lipid Profile: No results for input(s): CHOL, HDL, LDLCALC, TRIG, CHOLHDL, LDLDIRECT in the last 72 hours. Thyroid Function Tests: No results for input(s): TSH, T4TOTAL, FREET4, T3FREE, THYROIDAB in the last 72 hours. Anemia Panel: No results for input(s): VITAMINB12, FOLATE, FERRITIN, TIBC, IRON, RETICCTPCT in the last 72 hours. Sepsis Labs: Recent Labs  Lab 09/14/20 0830 09/14/20 1634  LATICACIDVEN 2.8* 2.1*    Recent Results (from the past 240 hour(s))  Culture, blood (Routine X 2) w Reflex to ID Panel     Status: None (Preliminary result)   Collection Time: 09/14/20  8:30 AM   Specimen: BLOOD  Result Value Ref Range Status   Specimen Description BLOOD SITE NOT SPECIFIED  Final   Special Requests   Final    BOTTLES DRAWN AEROBIC AND ANAEROBIC Blood Culture results may not be optimal due to an inadequate volume of blood received in culture bottles   Culture   Final    NO GROWTH 2 DAYS Performed at [facility], [place] [address] [place], [place] [address]    Report  Status PENDING  Incomplete  Resp Panel by RT-PCR (Flu A&B, Covid) Nasopharyngeal Swab  Status: Abnormal   Collection Time: 09/14/20  9:21 AM   Specimen: Nasopharyngeal Swab; Nasopharyngeal(NP) swabs in vial transport medium  Result Value Ref Range Status   SARS Coronavirus 2 by RT PCR POSITIVE (A) NEGATIVE Final    Comment: RESULT CALLED TO, READ BACK BY AND VERIFIED WITH: RN E DIXON [id number] AT 1147 AM BY CM (NOTE) SARS-CoV-2 target nucleic acids are DETECTED.  The SARS-CoV-2 RNA is generally detectable in upper respiratory specimens during the acute phase of infection. Positive results are indicative of the presence of the identified virus, but do not rule out bacterial infection or co-infection with other pathogens not detected by the test. Clinical correlation with patient history and other diagnostic information is necessary to determine patient infection status. The expected result is Negative.  Fact Sheet for Patients: EntrepreneurPulse.com.au  Fact Sheet for Healthcare Providers: IncredibleEmployment.be  This test is not yet approved or cleared by the [place] FDA and  has been authorized for detection and/or diagnosis of SARS-CoV-2 by FDA under an Emergency Use Authorization (EUA).  This EUA will remain in effect (meaning this test can b e used) for the duration of  the COVID-19 declaration under Section 564(b)(1) of the Act, 21 U.S.C. section 360bbb-3(b)(1), unless the authorization is terminated or revoked sooner.     Influenza A by PCR NEGATIVE NEGATIVE Final   Influenza B by PCR NEGATIVE NEGATIVE Final    Comment: (NOTE) The Xpert Xpress SARS-CoV-2/FLU/RSV plus assay is intended as an aid in the diagnosis of influenza from Nasopharyngeal swab specimens and should not be used as a sole basis for treatment. Nasal washings and aspirates are unacceptable for Xpert Xpress SARS-CoV-2/FLU/RSV testing.  Fact Sheet for  Patients: EntrepreneurPulse.com.au  Fact Sheet for Healthcare Providers: IncredibleEmployment.be  This test is not yet approved or cleared by the [place] FDA and has been authorized for detection and/or diagnosis of SARS-CoV-2 by FDA under an Emergency Use Authorization (EUA). This EUA will remain in effect (meaning this test can be used) for the duration of the COVID-19 declaration under Section 564(b)(1) of the Act, 21 U.S.C. section 360bbb-3(b)(1), unless the authorization is terminated or revoked.  Performed at [facility], [place] [address], [place], [place] [address]          Radiology Studies: No results found.      Scheduled Meds: . DULoxetine  60 mg Oral Daily  . finasteride  5 mg Oral Daily  . furosemide  80 mg Oral Daily  . mometasone-formoterol  2 puff Inhalation BID  . pantoprazole  20 mg Oral BID  . potassium chloride  40 mEq Oral BID  . sodium chloride flush  3 mL Intravenous Q12H  . spironolactone  25 mg Oral Daily  . tamsulosin  0.4 mg Oral Daily   Continuous Infusions: . lactated ringers 75 mL/hr at 09/16/20 0559  . vancomycin       LOS: 2 days    Time spent: 35 minutes    Barb Merino, MD [facility] Pager [phone]

## 2020-09-17 ENCOUNTER — Inpatient Hospital Stay (HOSPITAL_COMMUNITY)

## 2020-09-17 DIAGNOSIS — G894 Chronic pain syndrome: Secondary | ICD-10-CM

## 2020-09-17 LAB — CBC WITH DIFFERENTIAL/PLATELET
Abs Immature Granulocytes: 0.07 10*3/uL (ref 0.00–0.07)
Basophils Absolute: 0.1 10*3/uL (ref 0.0–0.1)
Basophils Relative: 1 %
Eosinophils Absolute: 0.2 10*3/uL (ref 0.0–0.5)
Eosinophils Relative: 3 %
HCT: 29.3 % — ABNORMAL LOW (ref 39.0–52.0)
Hemoglobin: 9 g/dL — ABNORMAL LOW (ref 13.0–17.0)
Immature Granulocytes: 1 %
Lymphocytes Relative: 10 %
Lymphs Abs: 0.8 10*3/uL (ref 0.7–4.0)
MCH: 25.4 pg — ABNORMAL LOW (ref 26.0–34.0)
MCHC: 30.7 g/dL (ref 30.0–36.0)
MCV: 82.8 fL (ref 80.0–100.0)
Monocytes Absolute: 0.7 10*3/uL (ref 0.1–1.0)
Monocytes Relative: 9 %
Neutro Abs: 5.8 10*3/uL (ref 1.7–7.7)
Neutrophils Relative %: 76 %
Platelets: 214 10*3/uL (ref 150–400)
RBC: 3.54 MIL/uL — ABNORMAL LOW (ref 4.22–5.81)
RDW: 19.9 % — ABNORMAL HIGH (ref 11.5–15.5)
WBC: 7.6 10*3/uL (ref 4.0–10.5)
nRBC: 0 % (ref 0.0–0.2)

## 2020-09-17 LAB — BASIC METABOLIC PANEL
Anion gap: 11 (ref 5–15)
BUN: 19 mg/dL (ref 8–23)
CO2: 27 mmol/L (ref 22–32)
Calcium: 8.5 mg/dL — ABNORMAL LOW (ref 8.9–10.3)
Chloride: 99 mmol/L (ref 98–111)
Creatinine, Ser: 1.28 mg/dL — ABNORMAL HIGH (ref 0.61–1.24)
GFR, Estimated: 58 mL/min — ABNORMAL LOW (ref >60)
Glucose, Bld: 85 mg/dL (ref 70–99)
Potassium: 3.8 mmol/L (ref 3.5–5.1)
Sodium: 137 mmol/L (ref 135–145)

## 2020-09-17 MED ORDER — ALBUTEROL SULFATE (2.5 MG/3ML) 0.083% IN NEBU: 3.0000 mL | INHALATION_SOLUTION | Freq: Four times a day (QID) | RESPIRATORY_TRACT | Status: DC | PRN

## 2020-09-17 MED ORDER — OLANZAPINE 5 MG PO TBDP
2.5000 mg | ORAL_TABLET | Freq: Four times a day (QID) | ORAL | Status: DC | PRN
  Administered 2020-09-17 – 2020-09-18 (×3): 2.5 mg via ORAL
  Filled 2020-09-17 (×4): qty 0.5

## 2020-09-17 MED ORDER — DULOXETINE HCL 60 MG PO CPEP: 60.0000 mg | ORAL_CAPSULE | Freq: Every day | ORAL | Status: DC

## 2020-09-17 MED ORDER — AMOXICILLIN-POT CLAVULANATE 875-125 MG PO TABS
1.0000 | ORAL_TABLET | Freq: Two times a day (BID) | ORAL | Status: DC
  Administered 2020-09-17 – 2020-09-18 (×2): 1 via ORAL
  Filled 2020-09-17 (×3): qty 1

## 2020-09-17 MED ORDER — DULOXETINE HCL 30 MG PO CPEP: 30.0000 mg | ORAL_CAPSULE | Freq: Every day | ORAL | Status: DC

## 2020-09-17 MED ORDER — MORPHINE SULFATE ER 15 MG PO TBCR
15.0000 mg | EXTENDED_RELEASE_TABLET | Freq: Two times a day (BID) | ORAL | Status: DC
Start: 2020-09-17 — End: 2020-09-18
  Administered 2020-09-17 (×2): 15 mg via ORAL
  Filled 2020-09-17 (×2): qty 1

## 2020-09-17 NOTE — Progress Notes (Signed)
Pt repeatedly yelling out that he is in pain but refusing to take any PO medications. Unable to give oxycodone as needed for pain.   Prn fentanyl given as needed.

## 2020-09-17 NOTE — Progress Notes (Addendum)
[facility] [address] [facility]) Hospitalized Hospice Patient  [name] is acurrent hospice patient with a terminal diagnosis of hypertensive heart disease, chronic kidney disease with heart failure. Daughter called hospice to report patient had been up all night "hollering and having episodes" and had fallen out of the bed a few times. Daughter reported to [facility] RN she was unsure which medications patient may have taken, pill bottles were open, scattered on the floor with tops off and that cola had been spilled on meds or it could have possibly been urine. Patient requested family call 20. Patient evaluated in the ED and is admitted 09/14/20 for symptomatic anemia, hypotension, closed nondisplaced fracture of posterior arch of first cervical vertebra and Covid 19 infection. Patienthas shared that he desires to be FULL CODE with [name], PMT.Per Dr. Tomasa Hosteller this is a related hospital admission.  Unable to visit at the bedside due to COVID 19 restrictions. Attempted to call [name] to give update, unable to reach. LVM asking her to return my call to give update. I did attempt to call the patient in the room, without answer.  Spoke with [name] with TOC; who had phone conversation with patient who clarified that he wishes to return home with hospice services when he is medically stable. Family is requesting he not return home to unsanitary conditions; however, patient is alert and oriented and can make decisions for himself.  V/S: 97.6, 114/56, 75, 16, 96% on room air I/O: 1492/1549  Abnormal lab work: Creatinine 1.28, Calcium 8.5, GFR 58, RBC 3.54, Hgb 9.0, Hct 29.3, MCH 25.4, RDW 19.9  IV's/PRN's: LR @ 75 cc/hr, Magnesium Sulfate @gm  X 1, Vancomycin 1250 mg X 1, Fentanyl 25 mcg X 5 doses in last 24 hours  Pt remains GIP appropriate for treatment of infections with IV antibiotics and pain control.  Assessment & Plan:   Principal Problem:   Sepsis ([place]) Active  Problems:   Acute blood loss anemia   Acute metabolic encephalopathy   Fall at home, initial encounter   C1 cervical fracture ([place])   COVID-19 virus infection   Hypokalemia   Protein calorie malnutrition ([place])   Leukocytosis  Sepsis present on admission: Likely due to lower extremity wounds.  Presented with WBC count of 39.9 and lactic acid 2.8 and initially hypotensive.  Adequately responded to resuscitation.  Lactic acid normalized.  Blood pressure normalized. Blood cultures negative so far.  Chest x-ray with no evidence of infection.  Urine is clear. Right medial ankle wound looks infected with some subcutaneous swelling.   Treated with vancomycin and cefepime.  Discontinue cefepime.  Continue vancomycin.   Due to altered mental status and impulsiveness, patient could not undergo MRI of the ankle, a CT scan done today shows intact bones and no subcutaneous collection.  Likely chronic wound.   We will continue vancomycin while in the hospital, will change to oral broad-spectrum antibiotics on discharge for total 7 days.   Followed by wound care.  Unna boot to be applied.   Acute metabolic encephalopathy: Fluctuating mental status.  Nonfocal.  Likely due to COVID-19/pain medications.  Has been impulsive intermittently.   Palliative care following.  We will put him back on MS Contin, also on increased dose of oxycodone.  Will be difficult to manage his pain medications.   Under hospice care at home but now full code and wants all the treatments done.  Will titrate pain medication to find a good regimen.  Acute on chronic blood loss  anemia: Hemoglobin 6.7.  Guaiac positive stools.  Seen by gastroenterology.  No evidence of active bleeding.  GI decided against endoscopic evaluation.  On PPI. Received 1 unit of PRBC transfusion with appropriate response.  He is on therapeutic anticoagulation with Eliquis, will discontinue.  At this time side effects outweigh benefits.  Cervical 1 fracture  secondary to fall: On skeletal survey he was found to have posterior elements of a C1 fracture.  No neurological compromise.  Seen by neurosurgery and recommended Aspen collar and mobility.  Work with PT OT. MRI of the cervical spine was ordered, he is too impulsive and unable to go for MRI, will discontinue.  COVID-19 viral infection: Screening test positive for COVID-19.  Patient without pulmonary symptoms.  Symptomatic treatment.  A. fib on chronic anticoagulation: Rate controlled.  Will discontinue Eliquis.  Goal of care: Enrolled with hospice at home likely for pain management.  He wants to be treated, he wants to be full code.  Palliative care following.  Start mobilizing with PT OT.  May need to skilled nursing facility.  Today he is in no mood to discuss about discharge disposition.  He is at least agreeable to stay in the hospital.  [place]: Ongoing. Pt is full code, desiring treatment.  DC Planning: Pt desires to return home with hospice services and the conditions in which he was living. Unable to reach family.  IDT: updated  Family: unable to reach today. VM left for return phone call.  Please use [facility] for any transportation needs as hospice contracts this service for our patients.  Please call with any hospice related questions or concerns.  Thank you, [name], BSN, RN [place] Liaison [phone]

## 2020-09-17 NOTE — Progress Notes (Signed)
OT Note  Pt seen for evaluation. Full note to follow.  Maurie Boettcher, OT/L   Acute OT Clinical Specialist Acute Rehabilitation Services Pager [phone] Office [phone]

## 2020-09-17 NOTE — Progress Notes (Signed)
PROGRESS NOTE    [name]  UVO:[id number] DOB: Nov 17, 1940 DOA: 09/14/2020 PCP: [name], MD    Brief Narrative:  80 year old gentleman with history of hypertension, hyperlipidemia, chronic atrial fibrillation on anticoagulation, coronary artery disease, peripheral arterial disease, chronic lower extremity wound, cirrhosis and history of alcohol abuse presented to the emergency room after he was found fallen out of bed and found confused.  At his baseline patient is wheel chair bound and unable to get around without assistant.  He lives with his son and daughter-in-law, they help him around.  He gets around in a wheelchair.  Patient also has bilateral lower extremity wound and followed by wound care and currently has Unna boot. Patient is under hospice care at home, likely for pain management.  Patient was started on MS Contin 2 days ago, the night after MS Contin he was confused and laying naked in the bed.  EMS also reported that he was out of his prescribed number of oxycodone.  At the emergency room, his blood pressure was 88/42 heart rate 60 in A. fib.  He was given 500 mL fluid bolus.  WBC count was 39.9, hemoglobin 6.7, lactic acid 2.8.  COVID-19 screening test was positive.  On his trauma series, he was also found to have posterior body of C1 vertebral fracture.  Started on sepsis pathway, treated with antibiotics and admitted to the hospital.  Also received 1 unit of PRBC transfusion.   Assessment & Plan:   Principal Problem:   Sepsis ([place]) Active Problems:   Acute blood loss anemia   Acute metabolic encephalopathy   Fall at home, initial encounter   C1 cervical fracture ([place])   COVID-19 virus infection   Hypokalemia   Protein calorie malnutrition ([place])   Leukocytosis  Sepsis present on admission: Likely due to lower extremity wounds.  Presented with WBC count of 39.9 and lactic acid 2.8 and initially hypotensive.  Adequately responded to resuscitation.   Lactic acid normalized.  Blood pressure normalized. Blood cultures negative so far.  Chest x-ray with no evidence of infection.  Urine is clear. Right medial ankle wound looks infected with some subcutaneous swelling.   Treated with vancomycin and cefepime.  Discontinue cefepime.  Continue vancomycin.   Due to altered mental status and impulsiveness, patient could not undergo MRI of the ankle, a CT scan done today shows intact bones and no subcutaneous collection.  Likely chronic wound.   We will continue vancomycin while in the hospital, will change to oral broad-spectrum antibiotics on discharge for total 7 days.   Followed by wound care.  Unna boot to be applied.   Acute metabolic encephalopathy: Fluctuating mental status.  Nonfocal.  Likely due to COVID-19/pain medications.  Has been impulsive intermittently.   Palliative care following.  We will put him back on MS Contin, also on increased dose of oxycodone.  Will be difficult to manage his pain medications.   Under hospice care at home but now full code and wants all the treatments done.  Will titrate pain medication to find a good regimen.  Acute on chronic blood loss anemia: Hemoglobin 6.7.  Guaiac positive stools.  Seen by gastroenterology.  No evidence of active bleeding.  GI decided against endoscopic evaluation.  On PPI. Received 1 unit of PRBC transfusion with appropriate response.  He is on therapeutic anticoagulation with Eliquis, will discontinue.  At this time side effects outweigh benefits.  Cervical 1 fracture secondary to fall: On skeletal survey he was found  to have posterior elements of a C1 fracture.  No neurological compromise.  Seen by neurosurgery and recommended Aspen collar and mobility.  Work with PT OT. MRI of the cervical spine was ordered, he is too impulsive and unable to go for MRI, will discontinue.  COVID-19 viral infection: Screening test positive for COVID-19.  Patient without pulmonary symptoms.  Symptomatic  treatment.  A. fib on chronic anticoagulation: Rate controlled.  Will discontinue Eliquis.  Goal of care: Enrolled with hospice at home likely for pain management.  He wants to be treated, he wants to be full code.  Palliative care following.  Start mobilizing with PT OT.  May need to skilled nursing facility.  Today he is in no mood to discuss about discharge disposition.  He is at least agreeable to stay in the hospital.   DVT prophylaxis: SCDs Start: 09/14/20 1612   Code Status: Full code Family Communication: Daughter-in-law Ms. [name] and her husband on the phone 5/29. Unable to pick up phone today. Disposition Plan: Status is: Inpatient  Remains inpatient appropriate because:Inpatient level of care appropriate due to severity of illness   Dispo: The patient is from: Home              Anticipated d/c is to: Home with home health services vs skilled nursing facility.              Patient currently is not medically stable to d/c.   Difficult to place patient No   Consultants:   Gastroenterology, neurosurgery, palliative care  Procedures:   None  Antimicrobials:  Antibiotics Given (last 72 hours)    Date/Time Action Medication Dose Rate   09/15/20 0006 New Bag/Given  [Blood admin]   ceFEPIme (MAXIPIME) 2 g in sodium chloride 0.9 % 100 mL IVPB 2 g 200 mL/hr   09/15/20 1062 New Bag/Given   ceFEPIme (MAXIPIME) 2 g in sodium chloride 0.9 % 100 mL IVPB 2 g 200 mL/hr   09/15/20 1127 New Bag/Given   vancomycin (VANCOREADY) IVPB 1000 mg/200 mL 1,000 mg 200 mL/hr   09/15/20 2322 New Bag/Given   ceFEPIme (MAXIPIME) 2 g in sodium chloride 0.9 % 100 mL IVPB 2 g 200 mL/hr   09/16/20 1236 New Bag/Given   vancomycin (VANCOREADY) IVPB 1250 mg/250 mL 1,250 mg 166.7 mL/hr         Subjective: Patient seen and examined.  Overnight he had been hollering out for pain medications, remained uncomfortable. On my interview, he is very impulsive, keeps repeating same words.  Tells me  that his legs are very painful. Unable to have meaningful conversation.  Objective: Vitals:   09/16/20 2258 09/17/20 0552 09/17/20 0742 09/17/20 1000  BP: (!) 118/51 (!) 144/66 121/61 (!) 114/56  Pulse: 70 83 83   Resp: 16 (!) 22 12 16   Temp: 98 F (36.7 C) 98 F (36.7 C) 97.6 F (36.4 C)   TempSrc: Oral Oral Oral   SpO2: 96%  96%   Weight:      Height:        Intake/Output Summary (Last 24 hours) at 09/17/2020 1347 Last data filed at 09/16/2020 1753 Gross per 24 hour  Intake 1491.98 ml  Output 1750 ml  Net -258.02 ml   Filed Weights   09/14/20 0817  Weight: 79.4 kg    Examination:  General exam: Appears anxious and impulsive.  Moderate pain on mobility.  On room air.  Chronically sick looking.  Frail and debilitated. Respiratory system: Clear to auscultation. Respiratory effort  normal. Cardiovascular system: S1 & S2 heard, RRR. No JVD, murmurs, rubs, gallops or clicks.  Gastrointestinal system: Abdomen is nondistended, soft and nontender. No organomegaly or masses felt. Normal bowel sounds heard. Central nervous system: Alert and oriented. No focal neurological deficits.  Moves all extremities. Extremities:  Removal of bilateral foot dressing, he has a small healing ulcer on the left lateral aspect, another pinpoint ulcer on the left anterior shin and rest of the foot with chronic ischemic changes but no evidence of underlying infection. Right medial ankle has a dime sized wound, underlying induration and peripheral fluctuation. Chronic ischemic changes and poorly palpable pulses.  Pictures taken and uploaded in the chart.        Data Reviewed: I have personally reviewed following labs and imaging studies  CBC: Recent Labs  Lab 09/14/20 0830 09/14/20 0851 09/14/20 1634 09/15/20 0309 09/16/20 0511 09/17/20 0144  WBC 39.9*  --  34.1* 22.5* 10.8* 7.6  NEUTROABS  --   --  31.7*  --  9.5* 5.8  HGB 6.7* 7.1* 7.3* 8.0* 8.3* 9.0*  HCT 22.9* 21.0* 23.8* 25.9*  27.5* 29.3*  MCV 81.8  --  80.7 81.7 82.3 82.8  PLT 343  --  330 305 226 998   Basic Metabolic Panel: Recent Labs  Lab 09/14/20 0830 09/14/20 0851 09/15/20 0309 09/16/20 0511 09/17/20 0144  NA 135 137 136 135 137  K 3.2* 3.2* 3.3* 3.7 3.8  CL 103 102 104 107 99  CO2 21*  --  23 20* 27  GLUCOSE 116* 112* 116* 104* 85  BUN 19 19 22 22 19   CREATININE 1.79* 1.70* 1.42* 1.26* 1.28*  CALCIUM 7.8*  --  7.9* 7.9* 8.5*  MG  --   --   --  1.6*  --   PHOS  --   --   --  2.8  --    GFR: Estimated Creatinine Clearance: 49.9 mL/min (A) (by C-G formula based on SCr of 1.28 mg/dL (H)). Liver Function Tests: Recent Labs  Lab 09/14/20 0830  AST 30  ALT 14  ALKPHOS 118  BILITOT 0.8  PROT 6.0*  ALBUMIN 1.8*   No results for input(s): LIPASE, AMYLASE in the last 168 hours. No results for input(s): AMMONIA in the last 168 hours. Coagulation Profile: Recent Labs  Lab 09/14/20 0830  INR 1.4*   Cardiac Enzymes: No results for input(s): CKTOTAL, CKMB, CKMBINDEX, TROPONINI in the last 168 hours. BNP (last 3 results) No results for input(s): PROBNP in the last 8760 hours. HbA1C: No results for input(s): HGBA1C in the last 72 hours. CBG: No results for input(s): GLUCAP in the last 168 hours. Lipid Profile: No results for input(s): CHOL, HDL, LDLCALC, TRIG, CHOLHDL, LDLDIRECT in the last 72 hours. Thyroid Function Tests: No results for input(s): TSH, T4TOTAL, FREET4, T3FREE, THYROIDAB in the last 72 hours. Anemia Panel: No results for input(s): VITAMINB12, FOLATE, FERRITIN, TIBC, IRON, RETICCTPCT in the last 72 hours. Sepsis Labs: Recent Labs  Lab 09/14/20 0830 09/14/20 1634  LATICACIDVEN 2.8* 2.1*    Recent Results (from the past 240 hour(s))  Culture, blood (Routine X 2) w Reflex to ID Panel     Status: None (Preliminary result)   Collection Time: 09/14/20  8:30 AM   Specimen: BLOOD  Result Value Ref Range Status   Specimen Description BLOOD SITE NOT SPECIFIED  Final    Special Requests   Final    BOTTLES DRAWN AEROBIC AND ANAEROBIC Blood Culture results may not be optimal  due to an inadequate volume of blood received in culture bottles   Culture   Final    NO GROWTH 3 DAYS Performed at [facility], [place] [address], [place], [place] [address]    Report Status PENDING  Incomplete  Resp Panel by RT-PCR (Flu A&B, Covid) Nasopharyngeal Swab     Status: Abnormal   Collection Time: 09/14/20  9:21 AM   Specimen: Nasopharyngeal Swab; Nasopharyngeal(NP) swabs in vial transport medium  Result Value Ref Range Status   SARS Coronavirus 2 by RT PCR POSITIVE (A) NEGATIVE Final    Comment: RESULT CALLED TO, READ BACK BY AND VERIFIED WITH: RN E DIXON [id number] AT 1147 AM BY CM (NOTE) SARS-CoV-2 target nucleic acids are DETECTED.  The SARS-CoV-2 RNA is generally detectable in upper respiratory specimens during the acute phase of infection. Positive results are indicative of the presence of the identified virus, but do not rule out bacterial infection or co-infection with other pathogens not detected by the test. Clinical correlation with patient history and other diagnostic information is necessary to determine patient infection status. The expected result is Negative.  Fact Sheet for Patients: EntrepreneurPulse.com.au  Fact Sheet for Healthcare Providers: IncredibleEmployment.be  This test is not yet approved or cleared by the [place] FDA and  has been authorized for detection and/or diagnosis of SARS-CoV-2 by FDA under an Emergency Use Authorization (EUA).  This EUA will remain in effect (meaning this test can b e used) for the duration of  the COVID-19 declaration under Section 564(b)(1) of the Act, 21 U.S.C. section 360bbb-3(b)(1), unless the authorization is terminated or revoked sooner.     Influenza A by PCR NEGATIVE NEGATIVE Final   Influenza B by PCR NEGATIVE NEGATIVE Final    Comment: (NOTE) The Xpert  Xpress SARS-CoV-2/FLU/RSV plus assay is intended as an aid in the diagnosis of influenza from Nasopharyngeal swab specimens and should not be used as a sole basis for treatment. Nasal washings and aspirates are unacceptable for Xpert Xpress SARS-CoV-2/FLU/RSV testing.  Fact Sheet for Patients: EntrepreneurPulse.com.au  Fact Sheet for Healthcare Providers: IncredibleEmployment.be  This test is not yet approved or cleared by the [place] FDA and has been authorized for detection and/or diagnosis of SARS-CoV-2 by FDA under an Emergency Use Authorization (EUA). This EUA will remain in effect (meaning this test can be used) for the duration of the COVID-19 declaration under Section 564(b)(1) of the Act, 21 U.S.C. section 360bbb-3(b)(1), unless the authorization is terminated or revoked.  Performed at [facility], [place] [address], [place], [place] [address]          Radiology Studies: CT ANKLE RIGHT WO CONTRAST  Result Date: 09/17/2020 CLINICAL DATA:  Right ankle pain.  Altered mental status. EXAM: CT OF THE RIGHT ANKLE WITHOUT CONTRAST TECHNIQUE: Multidetector CT imaging of the right ankle was performed according to the standard protocol. Multiplanar CT image reconstructions were also generated. COMPARISON:  None. FINDINGS: Bones/Joint/Cartilage Severe osteopenia. No fracture or dislocation. Normal alignment. No joint effusion. No periosteal reaction or bone destruction. Small plantar calcaneal spur. Enthesopathic changes of the Achilles tendon insertion. Chronic fracture deformity of the anterior process of the calcaneus. Ligaments Ligaments are suboptimally evaluated by CT. Muscles and Tendons Generalized muscle atrophy. Flexor, extensor, peroneal and Achilles tendons are grossly intact. Plantar fascia is intact. Soft tissue No fluid collection or hematoma. No soft tissue mass. Extensive peripheral vascular atherosclerotic disease.  IMPRESSION: 1. No evidence of osteomyelitis of the right ankle. Electronically Signed  By: Kathreen Devoid   On: 09/17/2020 13:06        Scheduled Meds: . DULoxetine  60 mg Oral Daily  . finasteride  5 mg Oral Daily  . furosemide  80 mg Oral Daily  . mometasone-formoterol  2 puff Inhalation BID  . morphine  15 mg Oral Q12H  . pantoprazole  20 mg Oral BID  . sodium chloride flush  3 mL Intravenous Q12H  . spironolactone  25 mg Oral Daily  . tamsulosin  0.4 mg Oral Daily   Continuous Infusions: . lactated ringers 75 mL/hr at 09/16/20 0559  . vancomycin 1,250 mg (09/16/20 1236)     LOS: 3 days    Time spent: 35 minutes    Barb Merino, MD [facility] Pager [phone]

## 2020-09-17 NOTE — Consult Note (Signed)
WOC Nurse Consult Note: Patient receiving care in [place]. Reason for Consult: change unna boots There is already orders in the patient chart, placed on 09/14/20 by Fuller Plan, MD, for the unna boots to be changed twice weekly on Monday and Thursday. I spoke with off going nurse Joi, and explained all the nurses have to do is follow the instructions under "Change Unna Boot" in the nursing section of the order section.  The patient was not seen and will not be followed by the [facility] team. Val Riles, RN, MSN, [facility], CNS-BC, pager [phone]
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
## 2020-09-17 NOTE — Progress Notes (Signed)
Orthopedic Tech Progress Note Patient Details:  [name] 09/30/1940 [id number]  Ortho Devices Type of Ortho Device: Louretta Parma boot Ortho Device/Splint Location: BLE Ortho Device/Splint Interventions: Ordered,Application,Adjustment   Post Interventions Patient Tolerated: Well Instructions Provided: [facility] [place] 09/17/2020, 4:36 PM
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
## 2020-09-18 DIAGNOSIS — D72829 Elevated white blood cell count, unspecified: Secondary | ICD-10-CM

## 2020-09-18 DIAGNOSIS — E876 Hypokalemia: Secondary | ICD-10-CM

## 2020-09-18 DIAGNOSIS — Y92009 Unspecified place in unspecified non-institutional (private) residence as the place of occurrence of the external cause: Secondary | ICD-10-CM

## 2020-09-18 DIAGNOSIS — W19XXXA Unspecified fall, initial encounter: Secondary | ICD-10-CM

## 2020-09-18 DIAGNOSIS — G9341 Metabolic encephalopathy: Secondary | ICD-10-CM

## 2020-09-18 DIAGNOSIS — E46 Unspecified protein-calorie malnutrition: Secondary | ICD-10-CM

## 2020-09-18 DIAGNOSIS — U071 COVID-19: Secondary | ICD-10-CM

## 2020-09-18 LAB — CBC WITH DIFFERENTIAL/PLATELET
Abs Immature Granulocytes: 0.08 10*3/uL — ABNORMAL HIGH (ref 0.00–0.07)
Basophils Absolute: 0.1 10*3/uL (ref 0.0–0.1)
Basophils Relative: 1 %
Eosinophils Absolute: 0.1 10*3/uL (ref 0.0–0.5)
Eosinophils Relative: 2 %
HCT: 33.9 % — ABNORMAL LOW (ref 39.0–52.0)
Hemoglobin: 10.3 g/dL — ABNORMAL LOW (ref 13.0–17.0)
Immature Granulocytes: 1 %
Lymphocytes Relative: 16 %
Lymphs Abs: 1 10*3/uL (ref 0.7–4.0)
MCH: 24.9 pg — ABNORMAL LOW (ref 26.0–34.0)
MCHC: 30.4 g/dL (ref 30.0–36.0)
MCV: 81.9 fL (ref 80.0–100.0)
Monocytes Absolute: 0.7 10*3/uL (ref 0.1–1.0)
Monocytes Relative: 11 %
Neutro Abs: 4.3 10*3/uL (ref 1.7–7.7)
Neutrophils Relative %: 69 %
Platelets: 190 10*3/uL (ref 150–400)
RBC: 4.14 MIL/uL — ABNORMAL LOW (ref 4.22–5.81)
RDW: 20.3 % — ABNORMAL HIGH (ref 11.5–15.5)
WBC: 6.2 10*3/uL (ref 4.0–10.5)
nRBC: 0 % (ref 0.0–0.2)

## 2020-09-18 MED ORDER — MORPHINE SULFATE ER 15 MG PO TBCR
15.0000 mg | EXTENDED_RELEASE_TABLET | Freq: Two times a day (BID) | ORAL | Status: DC | PRN
  Filled 2020-09-18: qty 1

## 2020-09-18 MED ORDER — OXYCODONE HCL 5 MG PO TABS
15.0000 mg | ORAL_TABLET | ORAL | Status: DC | PRN
  Administered 2020-09-18 – 2020-09-19 (×3): 15 mg via ORAL
  Filled 2020-09-18 (×3): qty 3

## 2020-09-18 MED ORDER — FENTANYL CITRATE (PF) 100 MCG/2ML IJ SOLN
12.5000 ug | INTRAMUSCULAR | Status: DC | PRN
Start: 2020-09-18 — End: 2020-09-19

## 2020-09-18 NOTE — Progress Notes (Addendum)
Pt is very combative and refusing medication. Attending, Dr. Avon Gully advised.
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
## 2020-09-18 NOTE — Progress Notes (Signed)
Scheduled morphine modified to prn per verbal order from Dr. Avon Gully.
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
## 2020-09-18 NOTE — Progress Notes (Addendum)
OT Evaluation - late entry  Pt from home and appears to be total care with exception of self feeding. Full untouched tray beside pt. Nsg reports attempts to feed pt however pt declined meal. Pt with B knee and hip flexion contractures. Attempted bed mobility however pt moaning out in pain with any attempts of movement. Intermittently following commands and kept eyes closed majority of session. Pt saturated in urine and required Total A to clean pt and change linens. Nsg made aware of issues with male purewick and disconnected leaking IV. If plan is to return home, pt would benefit from air overlay mattress for his hospital bed in addition to hoyer, w/c, w/c cushion if family does not have one. Recommend B Prevalon boots over Una boots to decrease pressure on B heels. When asked if pt gets out of bedm Pt would say "I just move on my back"- unsure if family mobilizes pt out of bed. No answer when attempting to call family. Recommend [place] [facility] if provided by hospice. Will follow up acutely to further assess self feeding and education regarding cervical precautions and positioning.  Note - skini breakdown on R clavicle area apparently from cervical collar. Mepalex placed over wound, cervical collar adjusted and nsg notified.     09/17/20 1530  OT Visit Information  Last OT Received On 09/17/20  Assistance Needed +2  History of Present Illness Pt is a 80 y/o male admitted secondary to falls at home and AMS. Pt found to have sepsis and a C1 fracture. Neurosurgery was consulted and recommended a hard collar at all times. PMH including but not limited to hypertension, hyperlipidemia, atrial fibrillation on anticoagulation, CAD, PVD, iron deficiency anemia, BPH, cirrhosis and alcohol abuse.  Precautions  Precautions Fall  Precaution Comments cervical  Required Braces or Orthoses Cervical Brace  Cervical Brace Hard collar;At all times  Restrictions  Weight Bearing Restrictions No  Home Living   Family/patient expects to be discharged to: Private residence  Living Arrangements Children;Other relatives  Available Help at Discharge Family;Available PRN/intermittently  Type of Home House  Home Equipment Wheelchair - manual;Hospital bed (per note)  Additional Comments unable to obtain a full history as pt was a very poor historian and confused throughout  Prior Function  Level of Independence Needs assistance  Gait / Transfers Assistance Needed most likely bedbound  ADL's / Homemaking Assistance Needed most likely Max to total Assist for ADL tasks. Fmily unable to answer questions  Communication / Swallowing Assistance Needed ?HOH  Communication  Communication HOH (?)  Pain Assessment  Pain Assessment Faces  Faces Pain Scale 8  Pain Location BUE; "all over"; with amy movement  Pain Descriptors / Indicators Grimacing;Guarding;Moaning;Restless  Pain Intervention(s) Limited activity within patient's tolerance  Cognition  Arousal/Alertness Awake/alert  Behavior During Therapy Restless  Overall Cognitive Status No family/caregiver present to determine baseline cognitive functioning  Area of Impairment Orientation;Attention;Memory;Following commands;Safety/judgement;Awareness;Problem solving  Orientation Level Disoriented to;Time;Situation;Place  Current Attention Level Sustained  Memory Decreased short-term memory;Decreased recall of precautions  Following Commands Follows one step commands inconsistently  Safety/Judgement Decreased awareness of deficits;Decreased awareness of safety  Awareness Intellectual  Problem Solving Slow processing;Decreased initiation;Difficulty sequencing;Requires verbal cues;Requires tactile cues  General Comments Eyes closed majoority of session however pt responding at times to quesionts  Upper Extremity Assessment  Upper Extremity Assessment Generalized weakness (ablet o touch Hnads to mouth)  Lower Extremity Assessment  Lower Extremity  Assessment Defer to PT evaluation (B knee contractures @ 30 degrees)  RLE  Deficits / Details Curlex was donned to bilateral LEs from his knees to his toes secondary to chronic wounds  LLE Deficits / Details Curlex was donned to bilateral LEs from his knees to his toes secondary to chronic wounds  Cervical / Trunk Assessment  Cervical / Trunk Assessment Other exceptions  Cervical / Trunk Exceptions pt with a C1 fracture with Neurosurgery recommending hard collar at all times (kyphotic)  ADL  Overall ADL's  Needs assistance/impaired  Eating/Feeding Details (indicate cue type and reason) Pt with full tray to side untouched. Attemtped to have pt self feed however pt was unable to follow thorugh with commands then stated "I don't want it". Nsg states attempts were made earlier to feed pt  Functional mobility during ADLs Maximal assistance;+2 for physical assistance (rolling and attempts at bed mobility)  Vision- Assessment  Additional Comments pt unable to report; eyes closed @ 75% of session  Bed Mobility  Overal bed mobility Needs Assistance  Bed Mobility Rolling;Sidelying to Sit;Sit to Sidelying  Rolling Max assist;+2 for physical assistance  General bed mobility comments Pt unable to advance legs off bed. Max A to scoot legs forward then pt began yelling in pain. Returned pt to supine  Transfers  General transfer comment will need lift equipment due to B knee copntractures  Balance  Overall balance assessment Needs assistance;History of Falls  General Comments  General comments (skin integrity, edema, etc.) Will benefit form Prevalon boots  OT - End of Session  Activity Tolerance Patient limited by pain;Other (comment) (limited by cognitive status)  Patient left in bed;with call bell/phone within reach;with bed alarm set  Nurse Communication Mobility status;Other (comment) (male purewick not in place; suction not on; IV disconnected and leaking)  OT Assessment  OT  Recommendation/Assessment Patient needs continued OT Services  OT Visit Diagnosis Other abnormalities of gait and mobility (R26.89);History of falling (Z91.81);Muscle weakness (generalized) (M62.81);Other symptoms and signs involving cognitive function;Pain  Pain - Right/Left  (B)  Pain - part of body Shoulder;Arm;Hip;Knee;Leg;Ankle and joints of foot  OT Problem List Decreased strength;Decreased range of motion;Decreased activity tolerance;Impaired balance (sitting and/or standing);Decreased coordination;Decreased cognition;Decreased safety awareness;Decreased knowledge of use of DME or AE;Decreased knowledge of precautions;Impaired UE functional use;Pain  OT Plan  OT Frequency (ACUTE ONLY) Min 2X/week  OT Treatment/Interventions (ACUTE ONLY) Self-care/ADL training;Therapeutic exercise;DME and/or AE instruction;Therapeutic activities;Cognitive remediation/compensation;Patient/family education  AM-PAC OT "6 Clicks" Daily Activity Outcome Measure (Version 2)  Help from another person eating meals? 2  Help from another person taking care of personal grooming? 2  Help from another person toileting, which includes using toliet, bedpan, or urinal? 1  Help from another person bathing (including washing, rinsing, drying)? 1  Help from another person to put on and taking off regular upper body clothing? 1  Help from another person to put on and taking off regular lower body clothing? 1  6 Click Score 8  OT Recommendation  Follow Up Recommendations Supervision/Assistance - 24 hour  OT Equipment Other (comment) (Hoyer; air overlay mattress pad)  Individuals Consulted  Consulted and Agree with Results and Recommendations Patient unable/family or caregiver not available (family not available by phone)  Acute Rehab OT Goals  Patient Stated Goal unable to state  OT Goal Formulation Patient unable to participate in goal setting  Time For Goal Achievement 10/01/20  Potential to Achieve Goals Fair  OT  Time Calculation  OT Start Time (ACUTE ONLY) 1446  OT Stop Time (ACUTE ONLY) 1530  OT Time Calculation (min) 44 min  OT General Charges  $OT Visit 1 Visit  OT Evaluation  $OT Eval Moderate Complexity 1 Mod  OT Treatments  $Self Care/Home Management  23-37 mins   Maurie Boettcher, OT/L   Acute OT Clinical Specialist Acute Rehabilitation Services Pager [phone] Office [phone]
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
## 2020-09-18 NOTE — Progress Notes (Signed)
Pt removed from restraints. Attending advised.
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
## 2020-09-19 DIAGNOSIS — Z515 Encounter for palliative care: Secondary | ICD-10-CM

## 2020-09-19 DIAGNOSIS — E46 Unspecified protein-calorie malnutrition: Secondary | ICD-10-CM

## 2020-09-19 DIAGNOSIS — A419 Sepsis, unspecified organism: Secondary | ICD-10-CM

## 2020-09-19 DIAGNOSIS — G9341 Metabolic encephalopathy: Secondary | ICD-10-CM

## 2020-09-19 DIAGNOSIS — G8929 Other chronic pain: Secondary | ICD-10-CM

## 2020-09-19 DIAGNOSIS — R451 Restlessness and agitation: Secondary | ICD-10-CM

## 2020-09-19 DIAGNOSIS — U071 COVID-19: Secondary | ICD-10-CM

## 2020-09-19 DIAGNOSIS — S12031A Nondisplaced posterior arch fracture of first cervical vertebra, initial encounter for closed fracture: Secondary | ICD-10-CM

## 2020-09-19 DIAGNOSIS — D62 Acute posthemorrhagic anemia: Secondary | ICD-10-CM

## 2020-09-19 LAB — COMPREHENSIVE METABOLIC PANEL
ALT: 18 U/L (ref 0–44)
AST: 28 U/L (ref 15–41)
Albumin: 2.1 g/dL — ABNORMAL LOW (ref 3.5–5.0)
Alkaline Phosphatase: 95 U/L (ref 38–126)
Anion gap: 14 (ref 5–15)
BUN: 20 mg/dL (ref 8–23)
CO2: 30 mmol/L (ref 22–32)
Calcium: 8.8 mg/dL — ABNORMAL LOW (ref 8.9–10.3)
Chloride: 94 mmol/L — ABNORMAL LOW (ref 98–111)
Creatinine, Ser: 1.17 mg/dL (ref 0.61–1.24)
GFR, Estimated: >60 mL/min (ref >60)
Glucose, Bld: 104 mg/dL — ABNORMAL HIGH (ref 70–99)
Potassium: 3.3 mmol/L — ABNORMAL LOW (ref 3.5–5.1)
Sodium: 138 mmol/L (ref 135–145)
Total Bilirubin: 1.2 mg/dL (ref 0.3–1.2)
Total Protein: 7.4 g/dL (ref 6.5–8.1)

## 2020-09-19 LAB — CBC
HCT: 36.4 % — ABNORMAL LOW (ref 39.0–52.0)
Hemoglobin: 10.8 g/dL — ABNORMAL LOW (ref 13.0–17.0)
MCH: 24.8 pg — ABNORMAL LOW (ref 26.0–34.0)
MCHC: 29.7 g/dL — ABNORMAL LOW (ref 30.0–36.0)
MCV: 83.5 fL (ref 80.0–100.0)
Platelets: 204 10*3/uL (ref 150–400)
RBC: 4.36 MIL/uL (ref 4.22–5.81)
RDW: 20.6 % — ABNORMAL HIGH (ref 11.5–15.5)
WBC: 6.9 10*3/uL (ref 4.0–10.5)
nRBC: 0 % (ref 0.0–0.2)

## 2020-09-19 LAB — CULTURE, BLOOD (ROUTINE X 2): Culture: NO GROWTH

## 2020-09-19 LAB — MAGNESIUM: Magnesium: 1.9 mg/dL (ref 1.7–2.4)

## 2020-09-19 MED ORDER — GLYCOPYRROLATE 0.2 MG/ML IJ SOLN: 0.2000 mg | INTRAMUSCULAR | Status: DC | PRN

## 2020-09-19 MED ORDER — MORPHINE 100MG IN NS 100ML (1MG/ML) PREMIX INFUSION
2.0000 mg/h | INTRAVENOUS | Status: DC
  Administered 2020-09-19: 2 mg/h via INTRAVENOUS
  Administered 2020-09-20: 4 mg/h via INTRAVENOUS
  Filled 2020-09-19 (×2): qty 100

## 2020-09-19 MED ORDER — LORAZEPAM 2 MG/ML PO CONC: 1.0000 mg | ORAL | Status: DC

## 2020-09-19 MED ORDER — MORPHINE BOLUS VIA INFUSION
1.0000 mg | INTRAVENOUS | Status: DC | PRN
  Administered 2020-09-19: 1 mg via INTRAVENOUS
  Administered 2020-09-20: 2 mg via INTRAVENOUS
  Filled 2020-09-19: qty 2

## 2020-09-19 MED ORDER — MAGNESIUM SULFATE 2 GM/50ML IV SOLN
2.0000 g | Freq: Once | INTRAVENOUS | Status: AC
  Administered 2020-09-19: 2 g via INTRAVENOUS
  Filled 2020-09-19: qty 50

## 2020-09-19 MED ORDER — ACETAMINOPHEN 325 MG PO TABS: 650.0000 mg | ORAL_TABLET | Freq: Four times a day (QID) | ORAL | Status: DC | PRN

## 2020-09-19 MED ORDER — POLYVINYL ALCOHOL 1.4 % OP SOLN
1.0000 [drp] | Freq: Four times a day (QID) | OPHTHALMIC | Status: DC | PRN
  Filled 2020-09-19: qty 15

## 2020-09-19 MED ORDER — POTASSIUM CHLORIDE CRYS ER 20 MEQ PO TBCR
40.0000 meq | EXTENDED_RELEASE_TABLET | ORAL | Status: AC
Start: 2020-09-19 — End: 2020-09-19
  Administered 2020-09-19: 40 meq via ORAL
  Filled 2020-09-19: qty 2

## 2020-09-19 MED ORDER — ACETAMINOPHEN 650 MG RE SUPP: 650.0000 mg | Freq: Four times a day (QID) | RECTAL | Status: DC | PRN

## 2020-09-19 MED ORDER — LORAZEPAM 2 MG/ML IJ SOLN
1.0000 mg | INTRAMUSCULAR | Status: DC
  Administered 2020-09-19: 1 mg via INTRAVENOUS
  Filled 2020-09-19: qty 1

## 2020-09-19 MED ORDER — BIOTENE DRY MOUTH MT LIQD: 15.0000 mL | OROMUCOSAL | Status: DC | PRN

## 2020-09-19 NOTE — Progress Notes (Signed)
PROGRESS NOTE    [name]  WJX:[id number] DOB: April 13, 1941 DOA: 09/14/2020 PCP: [name], MD   Brief Narrative:   80 year old gentleman with history of hypertension, hyperlipidemia, chronic atrial fibrillation on anticoagulation, coronary artery disease, peripheral arterial disease, chronic lower extremity wound, cirrhosis and history of alcohol abuse presented to the emergency room after he was found fallen out of bed and found confused.  At his baseline patient is wheel chair bound and unable to get around without assistant.  He lives with his son and daughter-in-law, they help him around.  He gets around in a wheelchair.  Patient also has bilateral lower extremity wound and followed by wound care and currently has Unna boot. Patient is recently been under hospice care at home, likely for pain management. Patient was started on MS Contin 2 days ago, the night after MS Contin he was confused and laying naked in the bed.  EMS also reported that he was out of his recently prescribed oxycodone. COVID-19 screening test was positive. On his trauma series, he was also found to have posterior body of C1 vertebral fracture. Started on sepsis pathway, treated with antibiotics and admitted to the hospital.  Assessment & Plan:   Principal Problem:   Sepsis ([place]) Active Problems:   Acute blood loss anemia   Acute metabolic encephalopathy   Fall at home, initial encounter   C1 cervical fracture ([place])   COVID-19 virus infection   Hypokalemia   Protein calorie malnutrition (HCC)   Leukocytosis   - Profound ambulatory dysfunction:  - Sepsis, likely cellulitis of the ankle, present on admission: - Acute metabolic encephalopathy, POA likely in the setting of polypharmacy - Acute on chronic blood loss anemia:  - Cervical 1 fracture secondary to fall: On skeletal survey he was found to have posterior elements of a C1 fracture.  - Incidentally noted to be positive for COVID-19  - A.  fib previously on chronic anticoagulation -Lower extremity wounds -Chronic pain syndrome  Patient with very poor life quality, multiple comorbidities, already on hospice at home, with chronic pain, not significantly uncomfortable, appears to be with continuous decline, palliative medicine input greatly appreciated, they have discussed with family, patient is currently comfort care, with full comfort measures, he appears to be significantly uncomfortable, with significant pain, so he is started on Morphine  GTT, with as needed Ativan on comfort care pathway, palliative/social worker will be trying to assist with placement in facility under hospice care.    DVT prophylaxis:  None as comfort  Code Status: Comfort  Family Communication: Son and Daughter-in-law updated by palliative  Disposition Plan: Status is: Inpatient  Remains inpatient appropriate because:Inpatient level of care appropriate due to severity of illness  Dispo: The patient is from: Home              Anticipated d/c is to: facility with hospice/comfort              Patient currently is not medically stable to d/c.   Difficult to place patient No  Consultants:   Gastroenterology, neurosurgery, palliative care  Procedures:   None  Antimicrobials:  Antibiotics Given (last 72 hours)    Date/Time Action Medication Dose Rate   09/17/20 1415 New Bag/Given   vancomycin (VANCOREADY) IVPB 1250 mg/250 mL 1,250 mg 166.7 mL/hr   09/17/20 2221 Given   amoxicillin-clavulanate (AUGMENTIN) 875-125 MG per tablet 1 tablet 1 tablet    09/18/20 2119 Given   amoxicillin-clavulanate (AUGMENTIN) 875-125 MG per tablet 1  tablet 1 tablet       Subjective:  Patient himself is morning, cannot answer questions appropriately or provide any reliable complaints.  Objective: Vitals:   09/18/20 2100 09/19/20 0033 09/19/20 0519 09/19/20 1358  BP: 130/63 (!) 146/63 109/77 (!) 107/57  Pulse: 77 78 82 72  Resp: 18 18 16 18   Temp: 97.7 F  (36.5 C) 97.7 F (36.5 C) 97.7 F (36.5 C) 97.8 F (36.6 C)  TempSrc: Axillary Axillary Axillary Oral  SpO2: 96% 96% 98%   Weight:      Height:       No intake or output data in the 24 hours ending 09/19/20 1643 Filed Weights   09/14/20 0817  Weight: 79.4 kg    Examination:  Awake, anxious, confused, moaning, restless , extremely frail, deconditioned and chronically ill-appearing alert, Oriented X 3, No new F.N deficits, Normal affect Symmetrical Chest wall movement, Good air movement bilaterally, CTAB RRR,No Gallops,Rubs or new Murmurs, No Parasternal Heave +ve B.Sounds, Abd Soft, No tenderness, No rebound - guarding or rigidity. Wearing floating boots bilaterally        Data Reviewed: I have personally reviewed following labs and imaging studies  CBC: Recent Labs  Lab 09/14/20 1634 09/15/20 0309 09/16/20 0511 09/17/20 0144 09/18/20 0608 09/19/20 0422  WBC 34.1* 22.5* 10.8* 7.6 6.2 6.9  NEUTROABS 31.7*  --  9.5* 5.8 4.3  --   HGB 7.3* 8.0* 8.3* 9.0* 10.3* 10.8*  HCT 23.8* 25.9* 27.5* 29.3* 33.9* 36.4*  MCV 80.7 81.7 82.3 82.8 81.9 83.5  PLT 330 305 226 214 190 209   Basic Metabolic Panel: Recent Labs  Lab 09/14/20 0830 09/14/20 0851 09/15/20 0309 09/16/20 0511 09/17/20 0144 09/19/20 0422  NA 135 137 136 135 137 138  K 3.2* 3.2* 3.3* 3.7 3.8 3.3*  CL 103 102 104 107 99 94*  CO2 21*  --  23 20* 27 30  GLUCOSE 116* 112* 116* 104* 85 104*  BUN 19 19 22 22 19 20   CREATININE 1.79* 1.70* 1.42* 1.26* 1.28* 1.17  CALCIUM 7.8*  --  7.9* 7.9* 8.5* 8.8*  MG  --   --   --  1.6*  --  1.9  PHOS  --   --   --  2.8  --   --    GFR: Estimated Creatinine Clearance: 54.6 mL/min (by C-G formula based on SCr of 1.17 mg/dL). Liver Function Tests: Recent Labs  Lab 09/14/20 0830 09/19/20 0422  AST 30 28  ALT 14 18  ALKPHOS 118 95  BILITOT 0.8 1.2  PROT 6.0* 7.4  ALBUMIN 1.8* 2.1*   No results for input(s): LIPASE, AMYLASE in the last 168 hours. No results  for input(s): AMMONIA in the last 168 hours. Coagulation Profile: Recent Labs  Lab 09/14/20 0830  INR 1.4*   Cardiac Enzymes: No results for input(s): CKTOTAL, CKMB, CKMBINDEX, TROPONINI in the last 168 hours. BNP (last 3 results) No results for input(s): PROBNP in the last 8760 hours. HbA1C: No results for input(s): HGBA1C in the last 72 hours. CBG: No results for input(s): GLUCAP in the last 168 hours. Lipid Profile: No results for input(s): CHOL, HDL, LDLCALC, TRIG, CHOLHDL, LDLDIRECT in the last 72 hours. Thyroid Function Tests: No results for input(s): TSH, T4TOTAL, FREET4, T3FREE, THYROIDAB in the last 72 hours. Anemia Panel: No results for input(s): VITAMINB12, FOLATE, FERRITIN, TIBC, IRON, RETICCTPCT in the last 72 hours. Sepsis Labs: Recent Labs  Lab 09/14/20 0830 09/14/20 1634  LATICACIDVEN 2.8* 2.1*  Recent Results (from the past 240 hour(s))  Culture, blood (Routine X 2) w Reflex to ID Panel     Status: None   Collection Time: 09/14/20  8:30 AM   Specimen: BLOOD  Result Value Ref Range Status   Specimen Description BLOOD SITE NOT SPECIFIED  Final   Special Requests   Final    BOTTLES DRAWN AEROBIC AND ANAEROBIC Blood Culture results may not be optimal due to an inadequate volume of blood received in culture bottles   Culture   Final    NO GROWTH 5 DAYS Performed at [facility], [place] [address], [place], [place] [address]    Report Status 09/19/2020 FINAL  Final  Resp Panel by RT-PCR (Flu A&B, Covid) Nasopharyngeal Swab     Status: Abnormal   Collection Time: 09/14/20  9:21 AM   Specimen: Nasopharyngeal Swab; Nasopharyngeal(NP) swabs in vial transport medium  Result Value Ref Range Status   SARS Coronavirus 2 by RT PCR POSITIVE (A) NEGATIVE Final    Comment: RESULT CALLED TO, READ BACK BY AND VERIFIED WITH: RN E DIXON [id number] AT 1147 AM BY CM (NOTE) SARS-CoV-2 target nucleic acids are DETECTED.  The SARS-CoV-2 RNA is generally detectable in upper  respiratory specimens during the acute phase of infection. Positive results are indicative of the presence of the identified virus, but do not rule out bacterial infection or co-infection with other pathogens not detected by the test. Clinical correlation with patient history and other diagnostic information is necessary to determine patient infection status. The expected result is Negative.  Fact Sheet for Patients: EntrepreneurPulse.com.au  Fact Sheet for Healthcare Providers: IncredibleEmployment.be  This test is not yet approved or cleared by the [place] FDA and  has been authorized for detection and/or diagnosis of SARS-CoV-2 by FDA under an Emergency Use Authorization (EUA).  This EUA will remain in effect (meaning this test can b e used) for the duration of  the COVID-19 declaration under Section 564(b)(1) of the Act, 21 U.S.C. section 360bbb-3(b)(1), unless the authorization is terminated or revoked sooner.     Influenza A by PCR NEGATIVE NEGATIVE Final   Influenza B by PCR NEGATIVE NEGATIVE Final    Comment: (NOTE) The Xpert Xpress SARS-CoV-2/FLU/RSV plus assay is intended as an aid in the diagnosis of influenza from Nasopharyngeal swab specimens and should not be used as a sole basis for treatment. Nasal washings and aspirates are unacceptable for Xpert Xpress SARS-CoV-2/FLU/RSV testing.  Fact Sheet for Patients: EntrepreneurPulse.com.au  Fact Sheet for Healthcare Providers: IncredibleEmployment.be  This test is not yet approved or cleared by the [place] FDA and has been authorized for detection and/or diagnosis of SARS-CoV-2 by FDA under an Emergency Use Authorization (EUA). This EUA will remain in effect (meaning this test can be used) for the duration of the COVID-19 declaration under Section 564(b)(1) of the Act, 21 U.S.C. section 360bbb-3(b)(1), unless the authorization is  terminated or revoked.  Performed at [facility], [place] [address], [place], [place] [address]          Radiology Studies: No results found. Scheduled Meds: . DULoxetine  60 mg Oral Daily  . LORazepam  1 mg Intravenous Q4H  . mometasone-formoterol  2 puff Inhalation BID  . sodium chloride flush  3 mL Intravenous Q12H    LOS: 5 days   Time spent: 35 minutes  Phillips Climes, DO [facility] Pager [phone]

## 2020-09-19 NOTE — TOC Progression Note (Signed)
Transition of Care [facility]) - Progression Note    Patient Details  Name: [name] MRN: [id number] Date of Birth: 1940-07-05  Transition of Care [facility]) CM/SW [place], [place] Phone Number: 09/19/2020, 4:03 PM  Clinical Narrative:      MD informed TOC that patients family requested Hospice facility with [facility]. Cheri with [facility] confirmed they received referral from [place] NP.Misty with [facility] has been informed.SW will continue to follow and assist with discharge planning needs.        Expected Discharge Plan and Services                                                 Social Determinants of Health (SDOH) Interventions    Readmission Risk Interventions No flowsheet data found.

## 2020-09-19 NOTE — Progress Notes (Addendum)
[facility] [address] [facility]) Hospitalized Hospice Patient   [name] is a current hospice patient with a terminal diagnosis of hypertensive heart disease, chronic kidney disease with heart failure. Daughter called hospice to report patient had been up all night "hollering and having episodes" and had fallen out of the bed a few times. Daughter reported to [facility] RN she was unsure which medications patient may have taken, pill bottles were open, scattered on the floor with tops off and that cola had been spilled on meds or it could have possibly been urine. Patient requested family call 14. Patient evaluated in the ED and is admitted 09/14/20 for symptomatic anemia, hypotension, closed nondisplaced fracture of posterior arch of first cervical vertebra and Covid 19 infection. Patient has shared that he desires to be FULL CODE with [name], PMT. Per Dr. Tomasa Hosteller this is a related hospital admission.   Unable to visit at the bedside due to Covid 19 restrictions. Visited on unit and exchanged report with bedside staff. Patient is becoming more somnolent today with continued periods of restlessness and mild agitation. Communicated with PMT later in day and decision has been made to transition patient to a comfort-based approach and code status has been changed to a DNR. Patient continues to require IV medications for pain/agitation at times.   V/S: Temp- 97.8, HR- 72, RR- 16, BP- 107/57, spO2- 96% room air I/O: none reported in 24 hours   Abnormal lab work:  K+3.3, Chloride 94, Ca+ 8.8, Hgb 10.8, HCT 36.4 IV's/PRN's: Fentanyl 12.5 mcg X 3 doses in last 24 hours   Pt remains GIP appropriate for treatment of infections with IV pain and agitation control.   Assessment & Plan:    Profound ambulatory dysfunction:  - Sepsis, likely cellulitis of the ankle, present on admission: - Acute metabolic encephalopathy, POA likely in the setting of polypharmacy - Acute on chronic  blood loss anemia:  - Cervical 1 fracture secondary to fall: On skeletal survey he was found to have posterior elements of a C1 fracture.  - Incidentally noted to be positive for COVID-19  - A. fib previously on chronic anticoagulation -Lower extremity wounds -Chronic pain syndrome   Patient with very poor life quality, multiple comorbidities, already on hospice at home, with chronic pain, not significantly uncomfortable, appears to be with continuous decline, palliative medicine input greatly appreciated, they have discussed with family, patient is currently comfort care, with full comfort measures, he appears to be significantly uncomfortable, with significant pain, so he is started on Morphine  GTT, with as needed Ativan on comfort care pathway, palliative/social worker will be trying to assist with placement in facility under hospice care.    GOC: Ongoing. Patient no longer able to speak for himself, family desires DNR status   DC Planning: Ongoing   IDT: updated     Please use [facility] for any transportation needs as hospice contracts this service for our patients.   Please call with any hospice related questions or concerns.   Jhonnie Austill, Therapist, sports, BSN, [place] Liaison [phone]

## 2020-09-19 NOTE — Plan of Care (Signed)

## 2020-09-19 NOTE — Progress Notes (Signed)
Dr. Sidney Ace notified of K+ 3.3.  Orders received.  Will also check magnesium per MD order.  Will continue to monitor.  Jodell Cipro

## 2020-09-19 NOTE — Progress Notes (Signed)
Daily Progress Note   Patient Name: [name]       Date: 09/19/2020 DOB: August 08, 1940  Age: 80 y.o. MRN#: [id number] Attending Physician: Albertine Patricia, MD Primary Care Physician: [name], MD Admit Date: 09/14/2020  Reason for Consultation/Follow-up: goals of care  Subjective: Chart reviewed. Received update from RN as well as Chartered certified accountant. Oral intake remains poor - patient only ate 5% of his lunch tray.  Note that he has been having periods of agitation, requiring bilateral mittens.  On my assessment, patient is not able to have a conversation or even answer simple questions.   I had a long phone conversation with son Kennard and daughter-in-law [name]. Discussed that patient has been declining over the past few days and seemed to be approaching end of life. I expressed concern that he is showing signs of discomfort - acute on chronic pain as well as agitation. Discussed that patient could no longer participate in medical decision making.   The difference between full scope medical intervention and comfort care was considered.  I introduced the concept of a comfort path to family, emphasizing that this path involves de-escalating and stopping full scope medical interventions, allowing a natural course to occur. Discussed that the goal is comfort and dignity rather than cure/prolonging life.   Discussed transitioning to comfort care while in the hospital, and what that would look like--keeping him clean and dry, no labs, no artificial hydration or feeding, no antibiotics, minimizing of medications, comfort feeds, and medication for pain and dyspnea.   Family is agreeable to transition to full comfort care effective today. Discussed that patient would likely be eligible to  transfer to residential hospice. Discussed that [facility] is unable to care for patients with COVID. Family is agreeable for referral to be made to hospice facility in [facility].    Length of Stay: 5  Current Medications: Scheduled Meds:  . amoxicillin-clavulanate  1 tablet Oral Q12H  . DULoxetine  60 mg Oral Daily  . finasteride  5 mg Oral Daily  . furosemide  80 mg Oral Daily  . mometasone-formoterol  2 puff Inhalation BID  . pantoprazole  20 mg Oral BID  . sodium chloride flush  3 mL Intravenous Q12H  . spironolactone  25 mg Oral Daily  . tamsulosin  0.4  mg Oral Daily     PRN Meds: albuterol, albuterol, fentaNYL (SUBLIMAZE) injection, guaiFENesin-dextromethorphan, lactulose, OLANZapine zydis, ondansetron **OR** ondansetron (ZOFRAN) IV, oxyCODONE  Physical Exam Constitutional:      General: He is not in acute distress.    Appearance: He is ill-appearing.  Cardiovascular:     Rate and Rhythm: Normal rate.  Pulmonary:     Effort: Pulmonary effort is normal.  Neurological:     Comments: obtunded  Psychiatric:        Behavior: Behavior is agitated.             Vital Signs: BP (!) 107/57 (BP Location: Right Arm)   Pulse 72   Temp 97.8 F (36.6 C) (Oral)   Resp 18   Ht 5\' 10"  (1.778 m)   Wt 79.4 kg   SpO2 98%   BMI 25.11 kg/m  SpO2: SpO2: 98 % O2 Device: O2 Device: Room Air O2 Flow Rate: O2 Flow Rate (L/min): 2 L/min  Intake/output summary:   Intake/Output Summary (Last 24 hours) at 09/19/2020 1422 Last data filed at 09/18/2020 1600 Gross per 24 hour  Intake 240 ml  Output --  Net 240 ml   LBM: Last BM Date:  (unknown) Baseline Weight: Weight: 79.4 kg Most recent weight: Weight: 79.4 kg       Palliative Assessment/Data: PPS 20%       Palliative Care Assessment & Plan   HPI/Patient Profile:79 y.o.malewith past medical history of COPD, chronic diastolic heart failure, iron deficiency anemia, chronic pain, atrial fibrillation on anticoagulation,  CAD s/p CABG, PVD, BPH, and cirrhosis. He presented to the emergency departmenton5/27/2022after EMS was called for altered mental status.The patient's daughter-in-law showed EMS videos of the patient "talking out of his head" last night. EMS reported patient was found naked in his bed with poor hygiene. EMS was also concerned about patient's bottle of oxycodone was filled 5/26 with 90 pills, but only had 40 pills left in the bottle.  ED Course:Patient was alert and oriented x 4 per admitting physician. CT head and cervical spine was significant for nondisplaced fractures of C1 posterior arch bilaterally. Labs significant for WBC 39.9, hemoglobin 6.7, platelet count 343, potassium 3.2, BUN 19, creatinine 1.79, calcium 7.8, albumin 1.8, BNP 1149, and lactic acid 2.8.Chest x-ray showed no acute abnormality. COVID-19 positive. Sepsis protocol was initiated. Patient was transfused 1 unit PRBCs. Admitted to [facility] for sepsis, possible cellulitis, C1 fracture secondary to fall, acute blood loss anemia, and COVID-19 infection.   Patient recently started receiving home hospice services with [facility].  Assessment: - sepsis - lower extremity wounds - acute metabolic encephalopathy - acute on chronic blood loss anemia - Cervical fracture secondary to \\fall  - chronic atrial fibrillation - chronic pain  Recommendations/Plan: Full comfort measures initiated Code status changed to DNR/DNI Transfer to hospice facility in [facility] when eligibility confirmed and bed becomes available  Added orders for symptom management at EOL as well as discontinued orders that were not focused on comfort Unrestricted visitation  PMT will continue to follow holistically  Symptom Management:  Morphine infusion 2-10 mg/hr Scheduled lorazepam (ATIVAN) every 4 hours Haloperidol (HALDOL) prn for agitation  Glycopyrrolate (ROBINUL) for excessive secretions Ondansetron (ZOFRAN) prn for nausea Polyvinyl alcohol  (LIQUIFILM TEARS) prn for dry eyes Antiseptic oral rinse (BIOTENE) prn for dry mouth   Goals of Care and Additional Recommendations: Limitations on Scope of Treatment: Full Comfort Care  Code Status: DNR  Prognosis:  < 2 weeks  Discharge Planning: Hospice facility -  eligibility pending  Care plan was discussed with Dr. Waldron Labs, case management, CSW, bedside RN, hospice liaison   Thank you for allowing the Palliative Medicine Team to assist in the care of this patient.   Total Time 65 minutes Prolonged Time Billed  yes       Greater than 50%  of this time was spent counseling and coordinating care related to the above assessment and plan.  Lavena Bullion, NP  Please contact Palliative Medicine Team phone at [phone] for questions and concerns.

## 2020-09-20 DIAGNOSIS — Z515 Encounter for palliative care: Secondary | ICD-10-CM

## 2020-09-20 DIAGNOSIS — S12001A Unspecified nondisplaced fracture of first cervical vertebra, initial encounter for closed fracture: Secondary | ICD-10-CM

## 2020-09-20 MED ORDER — MORPHINE SULFATE 10 MG/5ML PO SOLN: 5.0000 mg | ORAL | 0 refills | Status: DC | PRN

## 2020-09-20 MED ORDER — LORAZEPAM 2 MG/ML PO CONC: 1.0000 mg | ORAL | 0 refills | Status: DC | PRN

## 2020-09-20 NOTE — TOC Progression Note (Addendum)
Transition of Care [facility]) - Progression Note    Patient Details  Name: [name] MRN: [id number] Date of Birth: 30-Jul-1940  Transition of Care [facility]) CM/SW [place], [place] Phone Number: 09/20/2020, 12:44 PM  Clinical Narrative:     [place] does not have beds at [facility] Possible beds in [place]. [facility] will follow up.   1330: CSW informed that hospice bed is available at [facility] facility in [place] and family has agreed. Attending notified and PTAR will be arranged once family completes paperwork and PCA pump is arranged at [facility] facility.        Expected Discharge Plan and Services                                                 Social Determinants of Health (SDOH) Interventions    Readmission Risk Interventions No flowsheet data found.

## 2020-09-20 NOTE — Discharge Instructions (Signed)
Management per hospice facility 

## 2020-09-20 NOTE — Discharge Summary (Signed)
Physician Discharge Summary  [name]:[id number] DOB: May 31, 1940 DOA: 09/14/2020  PCP: [name], MD  Admit date: 09/14/2020 Discharge date: 09/20/2020  Admitted From: Home Disposition: [place] [facility] house  Recommendations for Outpatient Follow-up:  -Management per hospice  Home Health:NO Equipment/Devices:NO  Discharge Condition:hospice CODE STATUS:Comfort Care Diet recommendation: Feeding for comfort  Brief/Interim Summary:   - Profound ambulatory dysfunction:  - Sepsis, likely cellulitis of the ankle, present on admission: - Acute metabolic encephalopathy, POA likely in the setting of polypharmacy - Acute on chronic blood loss anemia:  - Cervical 1 fracture secondary to fall: On skeletal survey he was found to have posterior elements of a C1 fracture.  - Incidentally noted to be positive for COVID-19  - A. fib previously on chronic anticoagulation -Lower extremity wounds -Chronic pain syndrome   Patient with very poor life quality, multiple comorbidities, already on hospice at home, with chronic pain, not significantly uncomfortable, appears to be with continuous decline, palliative medicine input greatly appreciated, they have discussed with family, patient is currently comfort care, with full comfort measures, he appears to be significantly uncomfortable, with significant pain, so he is started on Morphine  GTT, with as needed Ativan on comfort care pathway, palliative/social worker was following, patient is accepted to [name] where he will be discharged this afternoon   I have discussed with daughter-in-law before discharge  Discharge Diagnoses:  Principal Problem:   Sepsis ([place]) Active Problems:   Acute blood loss anemia   Acute metabolic encephalopathy   Fall at home, initial encounter   C1 cervical fracture ([place])   COVID-19 virus infection   Hypokalemia   Protein calorie malnutrition ([place])   Leukocytosis   Hospice  care patient    Discharge Instructions  Discharge Instructions    Discharge instructions   Complete by: As directed    Management per hospice facility   Increase activity slowly   Complete by: As directed      Allergies as of 09/20/2020      Reactions   Doxycycline Other (See Comments)   Unknown reaction   Gabapentin Other (See Comments)   Pt states make him go down to the floor and can not get up   Amlodipine Nausea And Vomiting, Other (See Comments)   dizziness   Fish Allergy Nausea And Vomiting   STATES HE HAS NOT EATEN ANY FISH INCLUDING SHELLFISH FOR PAST 40 YRS - IT CAUSED NAUSEA   Hydrocodone Itching, Rash   Other Nausea And Vomiting   STATES HE HAS NOT EATEN ANY FISH INCLUDING SHELLFISH FOR PAST 40 YRS - IT CAUSED NAUSEA   Tylenol [acetaminophen] Other (See Comments)   Liver problems      Medication List    STOP taking these medications   albuterol 108 (90 Base) MCG/ACT inhaler Commonly known as: VENTOLIN HFA   b complex vitamins tablet   beta carotene 25000 UNIT capsule   DULoxetine 60 MG capsule Commonly known as: CYMBALTA   Eliquis 5 MG Tabs tablet Generic drug: apixaban   ferrous sulfate 325 (65 FE) MG tablet   finasteride 5 MG tablet Commonly known as: PROSCAR   fluticasone 50 MCG/ACT nasal spray Commonly known as: FLONASE   furosemide 80 MG tablet Commonly known as: LASIX   Guaifenesin 1200 MG Tb12   guaiFENesin-dextromethorphan 100-10 MG/5ML syrup Commonly known as: ROBITUSSIN DM   ibuprofen 200 MG tablet Commonly known as: ADVIL   Klor-Con M20 20 MEQ tablet Generic drug: potassium chloride SA  lactulose 10 GM/15ML solution Commonly known as: CHRONULAC   lidocaine 5 % ointment Commonly known as: XYLOCAINE   morphine 15 MG 12 hr tablet Commonly known as: MS CONTIN Replaced by: morphine 10 MG/5ML solution   nystatin ointment Commonly known as: MYCOSTATIN   oxyCODONE 15 MG immediate release tablet Commonly known as:  ROXICODONE   Oxycodone HCl 20 MG Tabs   pantoprazole 20 MG tablet Commonly known as: PROTONIX   predniSONE 10 MG tablet Commonly known as: DELTASONE   QUERCETIN PO   rosuvastatin 40 MG tablet Commonly known as: CRESTOR   spironolactone 25 MG tablet Commonly known as: ALDACTONE   Symbicort 160-4.5 MCG/ACT inhaler Generic drug: budesonide-formoterol   tamsulosin 0.4 MG Caps capsule Commonly known as: FLOMAX   Transfer Bench Misc   Vitamin D (Ergocalciferol) 1.25 MG (50000 UNIT) Caps capsule Commonly known as: DRISDOL   ZINC PO     TAKE these medications   LORazepam 2 MG/ML concentrated solution Commonly known as: ATIVAN Take 0.5 mLs (1 mg total) by mouth every 4 (four) hours as needed.   morphine 10 MG/5ML solution Take 2.5 mLs (5 mg total) by mouth every 2 (two) hours as needed for severe pain. Replaces: morphine 15 MG 12 hr tablet       Allergies  Allergen Reactions  . Doxycycline Other (See Comments)    Unknown reaction  . Gabapentin Other (See Comments)    Pt states make him go down to the floor and can not get up  . Amlodipine Nausea And Vomiting and Other (See Comments)    dizziness  . Fish Allergy Nausea And Vomiting    STATES HE HAS NOT EATEN ANY FISH INCLUDING SHELLFISH FOR PAST 40 YRS - IT CAUSED NAUSEA  . Hydrocodone Itching and Rash  . Other Nausea And Vomiting    STATES HE HAS NOT EATEN ANY FISH INCLUDING SHELLFISH FOR PAST 40 YRS - IT CAUSED NAUSEA  . Tylenol [Acetaminophen] Other (See Comments)    Liver problems    Consultations:  Gastroenterology, neurosurgery, palliative care     Procedures/Studies: CT Head Wo Contrast  Result Date: 09/14/2020 CLINICAL DATA:  Fall from bed, altered mental status, trauma EXAM: CT HEAD WITHOUT CONTRAST CT CERVICAL SPINE WITHOUT CONTRAST TECHNIQUE: Multidetector CT imaging of the head and cervical spine was performed following the standard protocol without intravenous contrast. Multiplanar CT image  reconstructions of the cervical spine were also generated. COMPARISON:  04/03/2012 FINDINGS: CT HEAD FINDINGS Brain: Mild atrophy pattern and white matter microvascular ischemic changes throughout both cerebral hemispheres. No acute intracranial hemorrhage, mass lesion, new infarction, midline shift, herniation, hydrocephalus, or extra-axial fluid collection. No focal mass effect or edema. Cisterns are patent. Cerebellar atrophy as well. Vascular: No hyperdense vessel or unexpected calcification. Skull: Normal. Negative for fracture or focal lesion. Sinuses/Orbits: No acute finding. Other: None. CT CERVICAL SPINE FINDINGS Alignment: Normal. Skull base and vertebrae: There are acute bilateral nondisplaced fractures of the C1 posterior arch posteriorly, images 20 through 23 of series 5. Anterior arch of C1 appears intact. [place] axial alignment remains intact. Progressive erosive degenerative arthropathy of the C1-2 articulation, often seen with rheumatoid arthritis or CPPD. Odontoid remains intact. Remainder of the cervical spine demonstrates no acute osseous finding. Soft tissues and spinal canal: No prevertebral fluid or swelling. No visible canal hematoma. Disc levels: Large anterior bulky osteophytes throughout the cervical spine as before. Advanced multilevel degenerative change throughout the C-spine with disc space narrowing, endplate sclerosis and osteophyte formation. Only minor  facet arthropathy posteriorly. Preserved vertebral body heights. No focal kyphosis. Upper chest: Mild apical emphysema. Aorta atherosclerotic. Subclavian carotid atherosclerosis also noted. Other: None. IMPRESSION: No acute intracranial abnormality. Acute nondisplaced fractures of the C1 posterior arch bilaterally. Anterior arch of C1 is intact. Progressive erosive arthropathy at C1-2 articulation as seen with rheumatoid arthritis or CPPD. Similar advanced multilevel degenerative changes throughout the cervical spine as detailed  above. These results were called by telephone at the time of interpretation on 09/14/2020 at 9:57 am to provider JOSHUA LONG , who verbally acknowledged these results. Electronically Signed   By: Jerilynn Mages.  Shick M.D.   On: 09/14/2020 09:59   CT Cervical Spine Wo Contrast  Result Date: 09/14/2020 CLINICAL DATA:  Fall from bed, altered mental status, trauma EXAM: CT HEAD WITHOUT CONTRAST CT CERVICAL SPINE WITHOUT CONTRAST TECHNIQUE: Multidetector CT imaging of the head and cervical spine was performed following the standard protocol without intravenous contrast. Multiplanar CT image reconstructions of the cervical spine were also generated. COMPARISON:  04/03/2012 FINDINGS: CT HEAD FINDINGS Brain: Mild atrophy pattern and white matter microvascular ischemic changes throughout both cerebral hemispheres. No acute intracranial hemorrhage, mass lesion, new infarction, midline shift, herniation, hydrocephalus, or extra-axial fluid collection. No focal mass effect or edema. Cisterns are patent. Cerebellar atrophy as well. Vascular: No hyperdense vessel or unexpected calcification. Skull: Normal. Negative for fracture or focal lesion. Sinuses/Orbits: No acute finding. Other: None. CT CERVICAL SPINE FINDINGS Alignment: Normal. Skull base and vertebrae: There are acute bilateral nondisplaced fractures of the C1 posterior arch posteriorly, images 20 through 23 of series 5. Anterior arch of C1 appears intact. [place] axial alignment remains intact. Progressive erosive degenerative arthropathy of the C1-2 articulation, often seen with rheumatoid arthritis or CPPD. Odontoid remains intact. Remainder of the cervical spine demonstrates no acute osseous finding. Soft tissues and spinal canal: No prevertebral fluid or swelling. No visible canal hematoma. Disc levels: Large anterior bulky osteophytes throughout the cervical spine as before. Advanced multilevel degenerative change throughout the C-spine with disc space narrowing, endplate  sclerosis and osteophyte formation. Only minor facet arthropathy posteriorly. Preserved vertebral body heights. No focal kyphosis. Upper chest: Mild apical emphysema. Aorta atherosclerotic. Subclavian carotid atherosclerosis also noted. Other: None. IMPRESSION: No acute intracranial abnormality. Acute nondisplaced fractures of the C1 posterior arch bilaterally. Anterior arch of C1 is intact. Progressive erosive arthropathy at C1-2 articulation as seen with rheumatoid arthritis or CPPD. Similar advanced multilevel degenerative changes throughout the cervical spine as detailed above. These results were called by telephone at the time of interpretation on 09/14/2020 at 9:57 am to provider JOSHUA LONG , who verbally acknowledged these results. Electronically Signed   By: Jerilynn Mages.  Shick M.D.   On: 09/14/2020 09:59   CT ANKLE RIGHT WO CONTRAST  Result Date: 09/17/2020 CLINICAL DATA:  Right ankle pain.  Altered mental status. EXAM: CT OF THE RIGHT ANKLE WITHOUT CONTRAST TECHNIQUE: Multidetector CT imaging of the right ankle was performed according to the standard protocol. Multiplanar CT image reconstructions were also generated. COMPARISON:  None. FINDINGS: Bones/Joint/Cartilage Severe osteopenia. No fracture or dislocation. Normal alignment. No joint effusion. No periosteal reaction or bone destruction. Small plantar calcaneal spur. Enthesopathic changes of the Achilles tendon insertion. Chronic fracture deformity of the anterior process of the calcaneus. Ligaments Ligaments are suboptimally evaluated by CT. Muscles and Tendons Generalized muscle atrophy. Flexor, extensor, peroneal and Achilles tendons are grossly intact. Plantar fascia is intact. Soft tissue No fluid collection or hematoma. No soft tissue mass. Extensive peripheral vascular atherosclerotic disease.  IMPRESSION: 1. No evidence of osteomyelitis of the right ankle. Electronically Signed   By: Kathreen Devoid   On: 09/17/2020 13:06   DG Chest Portable 1  View  Result Date: 09/14/2020 CLINICAL DATA:  Fall from bed. EXAM: PORTABLE CHEST 1 VIEW COMPARISON:  08/09/2019. FINDINGS: Normal heart size and mediastinal contours. CABG. No acute infiltrate or edema. No effusion or pneumothorax. No acute osseous findings. Artifact from EKG leads. IMPRESSION: No active disease. Electronically Signed   By: Monte Fantasia M.D.   On: 09/14/2020 09:30      Subjective: -No significant events overnight as discussed with staff   Discharge Exam: Vitals:   09/19/20 1358 09/20/20 1400  BP: (!) 107/57 115/65  Pulse: 72   Resp: 18 (!) 5  Temp: 97.8 F (36.6 C) 98.6 F (37 C)  SpO2:     Vitals:   09/19/20 0033 09/19/20 0519 09/19/20 1358 09/20/20 1400  BP: (!) 146/63 109/77 (!) 107/57 115/65  Pulse: 78 82 72   Resp: 18 16 18  (!) 5  Temp: 97.7 F (36.5 C) 97.7 F (36.5 C) 97.8 F (36.6 C) 98.6 F (37 C)  TempSrc: Axillary Axillary Oral Axillary  SpO2: 96% 98%    Weight:      Height:        General: Frail, chronically ill-appearing, minimally responsive to loud verbal stimuli  Cardiovascular: RRR, no rubs, no gallops Respiratory: no wheezing, no rhonchi Abdominal: Soft Extremities: Lower extremity with Ace wrap bilaterally    The results of significant diagnostics from this hospitalization (including imaging, microbiology, ancillary and laboratory) are listed below for reference.     Microbiology: Recent Results (from the past 240 hour(s))  Culture, blood (Routine X 2) w Reflex to ID Panel     Status: None   Collection Time: 09/14/20  8:30 AM   Specimen: BLOOD  Result Value Ref Range Status   Specimen Description BLOOD SITE NOT SPECIFIED  Final   Special Requests   Final    BOTTLES DRAWN AEROBIC AND ANAEROBIC Blood Culture results may not be optimal due to an inadequate volume of blood received in culture bottles   Culture   Final    NO GROWTH 5 DAYS Performed at [facility], [place] [address], [place], [place] [address]     Report Status 09/19/2020 FINAL  Final  Resp Panel by RT-PCR (Flu A&B, Covid) Nasopharyngeal Swab     Status: Abnormal   Collection Time: 09/14/20  9:21 AM   Specimen: Nasopharyngeal Swab; Nasopharyngeal(NP) swabs in vial transport medium  Result Value Ref Range Status   SARS Coronavirus 2 by RT PCR POSITIVE (A) NEGATIVE Final    Comment: RESULT CALLED TO, READ BACK BY AND VERIFIED WITH: RN E DIXON [id number] AT 1147 AM BY CM (NOTE) SARS-CoV-2 target nucleic acids are DETECTED.  The SARS-CoV-2 RNA is generally detectable in upper respiratory specimens during the acute phase of infection. Positive results are indicative of the presence of the identified virus, but do not rule out bacterial infection or co-infection with other pathogens not detected by the test. Clinical correlation with patient history and other diagnostic information is necessary to determine patient infection status. The expected result is Negative.  Fact Sheet for Patients: EntrepreneurPulse.com.au  Fact Sheet for Healthcare Providers: IncredibleEmployment.be  This test is not yet approved or cleared by the [place] FDA and  has been authorized for detection and/or diagnosis of SARS-CoV-2 by FDA under an Emergency Use Authorization (EUA).  This EUA  will remain in effect (meaning this test can b e used) for the duration of  the COVID-19 declaration under Section 564(b)(1) of the Act, 21 U.S.C. section 360bbb-3(b)(1), unless the authorization is terminated or revoked sooner.     Influenza A by PCR NEGATIVE NEGATIVE Final   Influenza B by PCR NEGATIVE NEGATIVE Final    Comment: (NOTE) The Xpert Xpress SARS-CoV-2/FLU/RSV plus assay is intended as an aid in the diagnosis of influenza from Nasopharyngeal swab specimens and should not be used as a sole basis for treatment. Nasal washings and aspirates are unacceptable for Xpert Xpress SARS-CoV-2/FLU/RSV testing.  Fact Sheet for  Patients: EntrepreneurPulse.com.au  Fact Sheet for Healthcare Providers: IncredibleEmployment.be  This test is not yet approved or cleared by the [place] FDA and has been authorized for detection and/or diagnosis of SARS-CoV-2 by FDA under an Emergency Use Authorization (EUA). This EUA will remain in effect (meaning this test can be used) for the duration of the COVID-19 declaration under Section 564(b)(1) of the Act, 21 U.S.C. section 360bbb-3(b)(1), unless the authorization is terminated or revoked.  Performed at [facility], [place] [address] [place], [place] [address]      Labs: BNP (last 3 results) Recent Labs    09/14/20 1003  BNP 2,706.2*   Basic Metabolic Panel: Recent Labs  Lab 09/14/20 0830 09/14/20 0851 09/15/20 0309 09/16/20 0511 09/17/20 0144 09/19/20 0422  NA 135 137 136 135 137 138  K 3.2* 3.2* 3.3* 3.7 3.8 3.3*  CL 103 102 104 107 99 94*  CO2 21*  --  23 20* 27 30  GLUCOSE 116* 112* 116* 104* 85 104*  BUN 19 19 22 22 19 20   CREATININE 1.79* 1.70* 1.42* 1.26* 1.28* 1.17  CALCIUM 7.8*  --  7.9* 7.9* 8.5* 8.8*  MG  --   --   --  1.6*  --  1.9  PHOS  --   --   --  2.8  --   --    Liver Function Tests: Recent Labs  Lab 09/14/20 0830 09/19/20 0422  AST 30 28  ALT 14 18  ALKPHOS 118 95  BILITOT 0.8 1.2  PROT 6.0* 7.4  ALBUMIN 1.8* 2.1*   No results for input(s): LIPASE, AMYLASE in the last 168 hours. No results for input(s): AMMONIA in the last 168 hours. CBC: Recent Labs  Lab 09/14/20 1634 09/15/20 0309 09/16/20 0511 09/17/20 0144 09/18/20 0608 09/19/20 0422  WBC 34.1* 22.5* 10.8* 7.6 6.2 6.9  NEUTROABS 31.7*  --  9.5* 5.8 4.3  --   HGB 7.3* 8.0* 8.3* 9.0* 10.3* 10.8*  HCT 23.8* 25.9* 27.5* 29.3* 33.9* 36.4*  MCV 80.7 81.7 82.3 82.8 81.9 83.5  PLT 330 305 226 214 190 204   Cardiac Enzymes: No results for input(s): CKTOTAL, CKMB, CKMBINDEX, TROPONINI in the last 168  hours. BNP: Invalid input(s): POCBNP CBG: No results for input(s): GLUCAP in the last 168 hours. D-Dimer No results for input(s): DDIMER in the last 72 hours. Hgb A1c No results for input(s): HGBA1C in the last 72 hours. Lipid Profile No results for input(s): CHOL, HDL, LDLCALC, TRIG, CHOLHDL, LDLDIRECT in the last 72 hours. Thyroid function studies No results for input(s): TSH, T4TOTAL, T3FREE, THYROIDAB in the last 72 hours.  Invalid input(s): FREET3 Anemia work up No results for input(s): VITAMINB12, FOLATE, FERRITIN, TIBC, IRON, RETICCTPCT in the last 72 hours. Urinalysis    Component Value Date/Time   COLORURINE YELLOW 09/15/2020 1210   APPEARANCEUR CLOUDY (  A) 09/15/2020 1210   LABSPEC 1.018 09/15/2020 1210   PHURINE 5.0 09/15/2020 1210   GLUCOSEU NEGATIVE 09/15/2020 1210   GLUCOSEU NEGATIVE 01/12/2019 1517   HGBUR SMALL (A) 09/15/2020 1210   BILIRUBINUR NEGATIVE 09/15/2020 1210   BILIRUBINUR 3+ 09/16/2017 1617   KETONESUR NEGATIVE 09/15/2020 1210   PROTEINUR 30 (A) 09/15/2020 1210   UROBILINOGEN 1.0 01/12/2019 1517   NITRITE NEGATIVE 09/15/2020 1210   LEUKOCYTESUR LARGE (A) 09/15/2020 1210   Sepsis Labs Invalid input(s): PROCALCITONIN,  WBC,  LACTICIDVEN Microbiology Recent Results (from the past 240 hour(s))  Culture, blood (Routine X 2) w Reflex to ID Panel     Status: None   Collection Time: 09/14/20  8:30 AM   Specimen: BLOOD  Result Value Ref Range Status   Specimen Description BLOOD SITE NOT SPECIFIED  Final   Special Requests   Final    BOTTLES DRAWN AEROBIC AND ANAEROBIC Blood Culture results may not be optimal due to an inadequate volume of blood received in culture bottles   Culture   Final    NO GROWTH 5 DAYS Performed at [facility], [place] [address], [place], [place] [address]    Report Status 09/19/2020 FINAL  Final  Resp Panel by RT-PCR (Flu A&B, Covid) Nasopharyngeal Swab     Status: Abnormal   Collection Time: 09/14/20  9:21 AM    Specimen: Nasopharyngeal Swab; Nasopharyngeal(NP) swabs in vial transport medium  Result Value Ref Range Status   SARS Coronavirus 2 by RT PCR POSITIVE (A) NEGATIVE Final    Comment: RESULT CALLED TO, READ BACK BY AND VERIFIED WITH: RN E DIXON [id number] AT 1147 AM BY CM (NOTE) SARS-CoV-2 target nucleic acids are DETECTED.  The SARS-CoV-2 RNA is generally detectable in upper respiratory specimens during the acute phase of infection. Positive results are indicative of the presence of the identified virus, but do not rule out bacterial infection or co-infection with other pathogens not detected by the test. Clinical correlation with patient history and other diagnostic information is necessary to determine patient infection status. The expected result is Negative.  Fact Sheet for Patients: EntrepreneurPulse.com.au  Fact Sheet for Healthcare Providers: IncredibleEmployment.be  This test is not yet approved or cleared by the [place] FDA and  has been authorized for detection and/or diagnosis of SARS-CoV-2 by FDA under an Emergency Use Authorization (EUA).  This EUA will remain in effect (meaning this test can b e used) for the duration of  the COVID-19 declaration under Section 564(b)(1) of the Act, 21 U.S.C. section 360bbb-3(b)(1), unless the authorization is terminated or revoked sooner.     Influenza A by PCR NEGATIVE NEGATIVE Final   Influenza B by PCR NEGATIVE NEGATIVE Final    Comment: (NOTE) The Xpert Xpress SARS-CoV-2/FLU/RSV plus assay is intended as an aid in the diagnosis of influenza from Nasopharyngeal swab specimens and should not be used as a sole basis for treatment. Nasal washings and aspirates are unacceptable for Xpert Xpress SARS-CoV-2/FLU/RSV testing.  Fact Sheet for Patients: EntrepreneurPulse.com.au  Fact Sheet for Healthcare Providers: IncredibleEmployment.be  This test is not yet  approved or cleared by the [place] FDA and has been authorized for detection and/or diagnosis of SARS-CoV-2 by FDA under an Emergency Use Authorization (EUA). This EUA will remain in effect (meaning this test can be used) for the duration of the COVID-19 declaration under Section 564(b)(1) of the Act, 21 U.S.C. section 360bbb-3(b)(1), unless the authorization is terminated or revoked.  Performed at [facility], [address], [place], [place] [address]      Time coordinating discharge:  30 minutes  SIGNED:   Phillips Climes, MD  [facility] 09/20/2020, 2:05 PM Pager   If 7PM-7AM, please contact night-coverage [email or web address] Password [email or web address]

## 2020-09-20 NOTE — Progress Notes (Signed)
   WE have reached out to the family for [name] and they are in agreement for pt for pt to continue to receive comfort care and transition to the inpt unit with hospice services. The first choice was [facility] in [facility]. We are unable to accomodates the pt there today but do have a COVID bed at the [facility] in [place]. Family is in agreement with the pt transferring there.   Our MD has approved him for our services. He can transfer today.   [facility] is aware and will transfer the pt to our services.  Thank you Webb Silversmith RN [phone]

## 2020-09-20 NOTE — TOC Transition Note (Signed)
Transition of Care [facility]) - CM/SW Discharge Note   Patient Details  Name: [name] MRN: [id number] Date of Birth: 02/16/1941  Transition of Care [facility]) CM/SW Contact:  Bethann Berkshire, [place] Phone Number: 09/20/2020, 2:54 PM   Clinical Narrative:     Patient will DC to: [facility] Anticipated DC date: 09/20/20 Transport by: Corey Harold   Per MD patient ready for DC to [facility]. RN, patient, and facility notified of DC. Discharge Summary and FL2 sent to facility. RN to call report prior to discharge [phone]). DC packet on chart. Ambulance transport requested for patient.   CSW will sign off for now as social work intervention is no longer needed. Please consult [place] again if new needs arise.   Final next level of care: [place] Barriers to Discharge: No Barriers Identified   Patient Goals and CMS Choice        Discharge Placement              Patient chooses bed at:  [facility]) Patient to be transferred to facility by: [facility]   Patient and family notified of of transfer: 09/20/20  Discharge Plan and Services                                     Social Determinants of Health (SDOH) Interventions     Readmission Risk Interventions No flowsheet data found.

## 2020-09-20 NOTE — Consult Note (Signed)
   [facility] Inpatient Consult   09/20/2020  [name] 02-Aug-1940 [id number]  [employer] [facility] [[facility]] Patient: on [employer], Patient with Hospice   Reviewed for extreme high risk score.  Patient is hospice and for hospice facility.  Plan:  Sign off  Natividad Brood, RN BSN [facility] Liaison  [phone] business mobile phone Toll free office [phone]  Fax number: [phone] [email or web address]

## 2020-09-23 DIAGNOSIS — I5032 Chronic diastolic (congestive) heart failure: Secondary | ICD-10-CM | POA: Diagnosis not present

## 2020-09-23 DIAGNOSIS — J449 Chronic obstructive pulmonary disease, unspecified: Secondary | ICD-10-CM | POA: Diagnosis not present

## 2020-09-23 DIAGNOSIS — J441 Chronic obstructive pulmonary disease with (acute) exacerbation: Secondary | ICD-10-CM | POA: Diagnosis not present

## 2020-09-24 ENCOUNTER — Encounter: Payer: Self-pay | Admitting: Physician Assistant

## 2020-09-25 ENCOUNTER — Encounter: Payer: Self-pay | Admitting: Physician Assistant

## 2020-09-26 ENCOUNTER — Encounter: Payer: Self-pay | Admitting: Physician Assistant

## 2020-09-27 ENCOUNTER — Telehealth: Payer: Self-pay | Admitting: Physician Assistant

## 2020-09-27 NOTE — Telephone Encounter (Signed)
Moved 6/13 appt to 6/17 due to provider being out sick. Called and left detailed msg about change in appt date and time

## 2020-10-01 ENCOUNTER — Ambulatory Visit: Payer: Medicare Other | Admitting: Physician Assistant

## 2020-10-01 ENCOUNTER — Other Ambulatory Visit: Payer: Medicare Other

## 2020-10-01 ENCOUNTER — Telehealth: Payer: Self-pay | Admitting: Internal Medicine

## 2020-10-01 NOTE — Telephone Encounter (Signed)
[facility]  [id number]  [name] passed away on October 11, 2020 at 8:37 am

## 2020-10-02 ENCOUNTER — Encounter: Payer: Self-pay | Admitting: Physician Assistant

## 2020-10-05 ENCOUNTER — Other Ambulatory Visit: Payer: Medicare Other

## 2020-10-05 ENCOUNTER — Ambulatory Visit: Payer: Medicare Other | Admitting: Physician Assistant

## 2020-10-19 DEATH — deceased

## 2020-11-05 ENCOUNTER — Encounter: Payer: Self-pay | Admitting: Physician Assistant

## 2020-11-12 ENCOUNTER — Ambulatory Visit: Payer: Medicare Other

## 2020-12-11 IMAGING — CR DG HIP (WITH OR WITHOUT PELVIS) 2-3V LEFT
3 series · 3 of 3 positions shown · non-contrast
Comparison: 06/22/2018 left hip radiographs

CLINICAL DATA: Left hip pain, suspect dislocation

EXAM:
DG HIP (WITH OR WITHOUT PELVIS) 2-3V LEFT

[w hip lat left (1 of 2)]
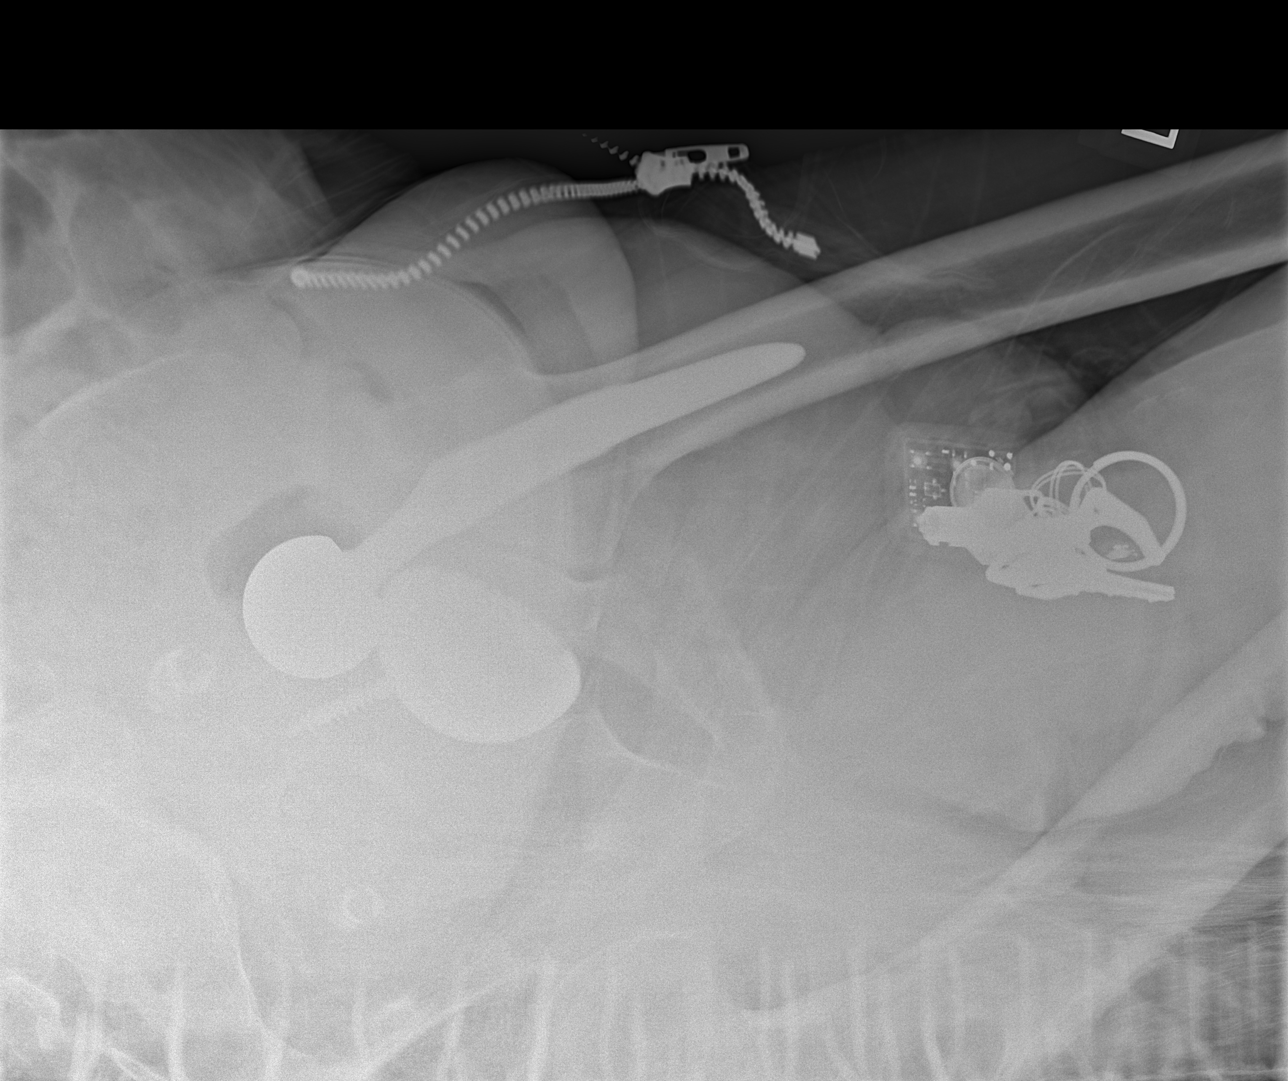

[w pelvis upright]
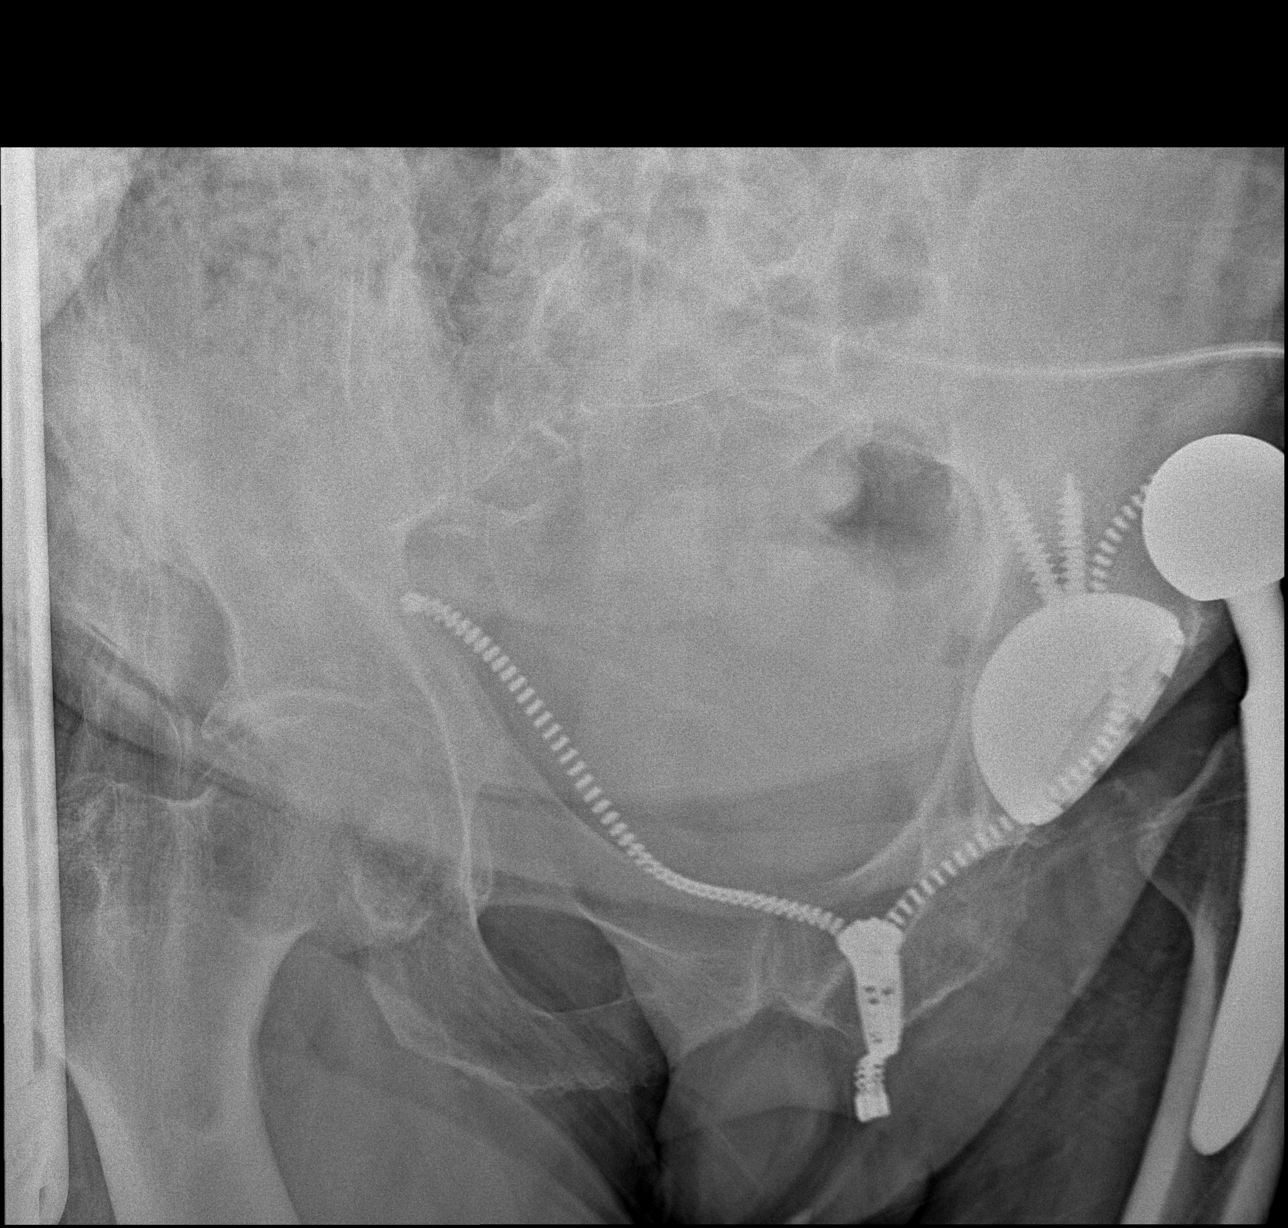

[w hip lat left (2 of 2)]
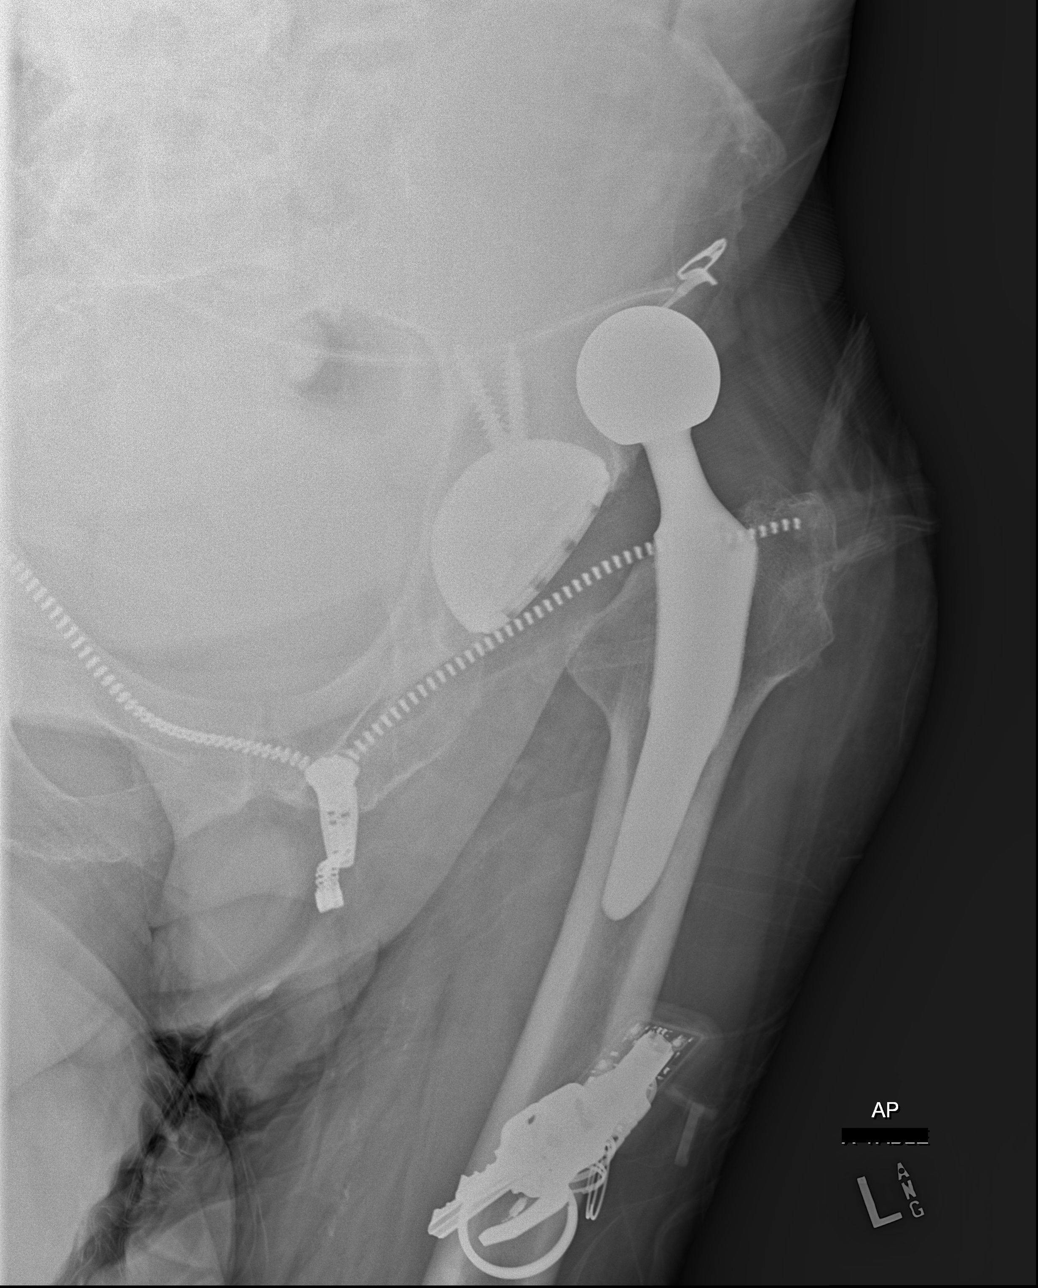

[3 of 3 positions shown; findings below may reference images not displayed]

FINDINGS: Images obscured by patient's pants zipper and metallic keys
overlying the left thigh. Superolateral left hip dislocation. Left
total hip arthroplasty. No hardware fracture. No acute osseous
fracture. No pelvic diastasis.
IMPRESSION: Superolateral left hip dislocation. Left total hip arthroplasty. No
acute osseous or hardware fracture.

## 2020-12-11 IMAGING — RF OPERATIVE LEFT HIP WITH PELVIS
1 series · 2 of 2 positions shown · non-contrast
Comparison: 09/18/2018

CLINICAL DATA: Closed hip reduction

EXAM:
OPERATIVE LEFT HIP (WITH PELVIS IF PERFORMED) 2 VIEWS
TECHNIQUE: Fluoroscopic spot image(s) were submitted for interpretation
post-operatively.

[Series 1: unknown protocol · 0.20mm/px · 2 of 2 slices shown]
[im 1/2]
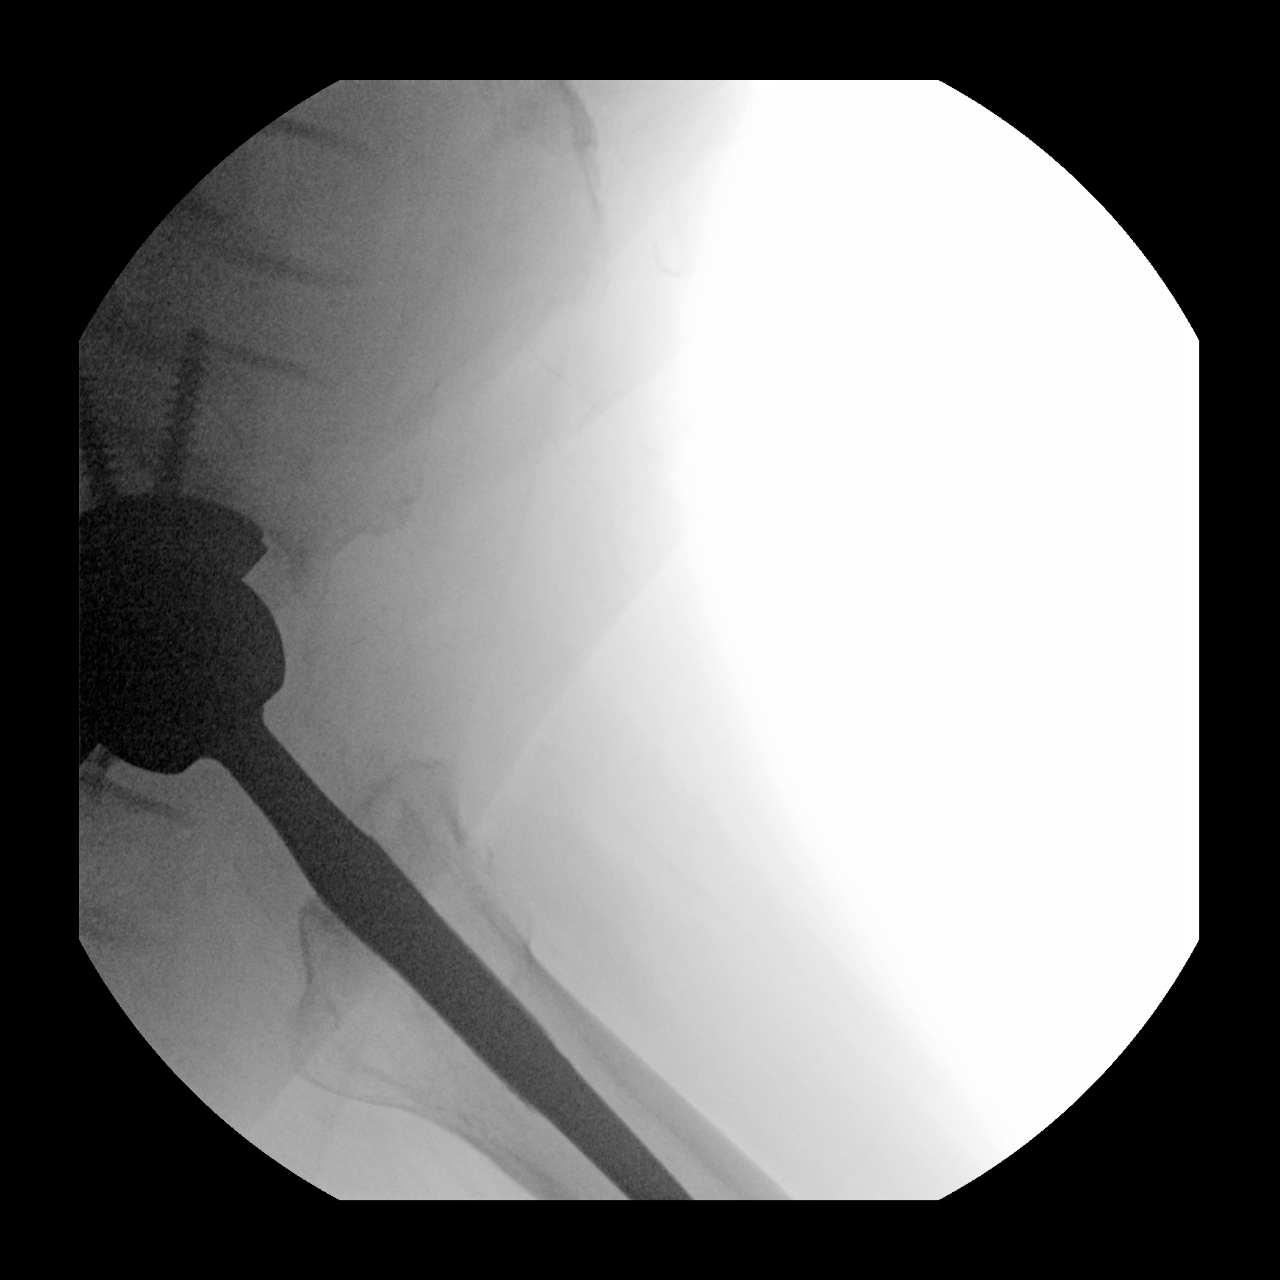
[im 2/2]
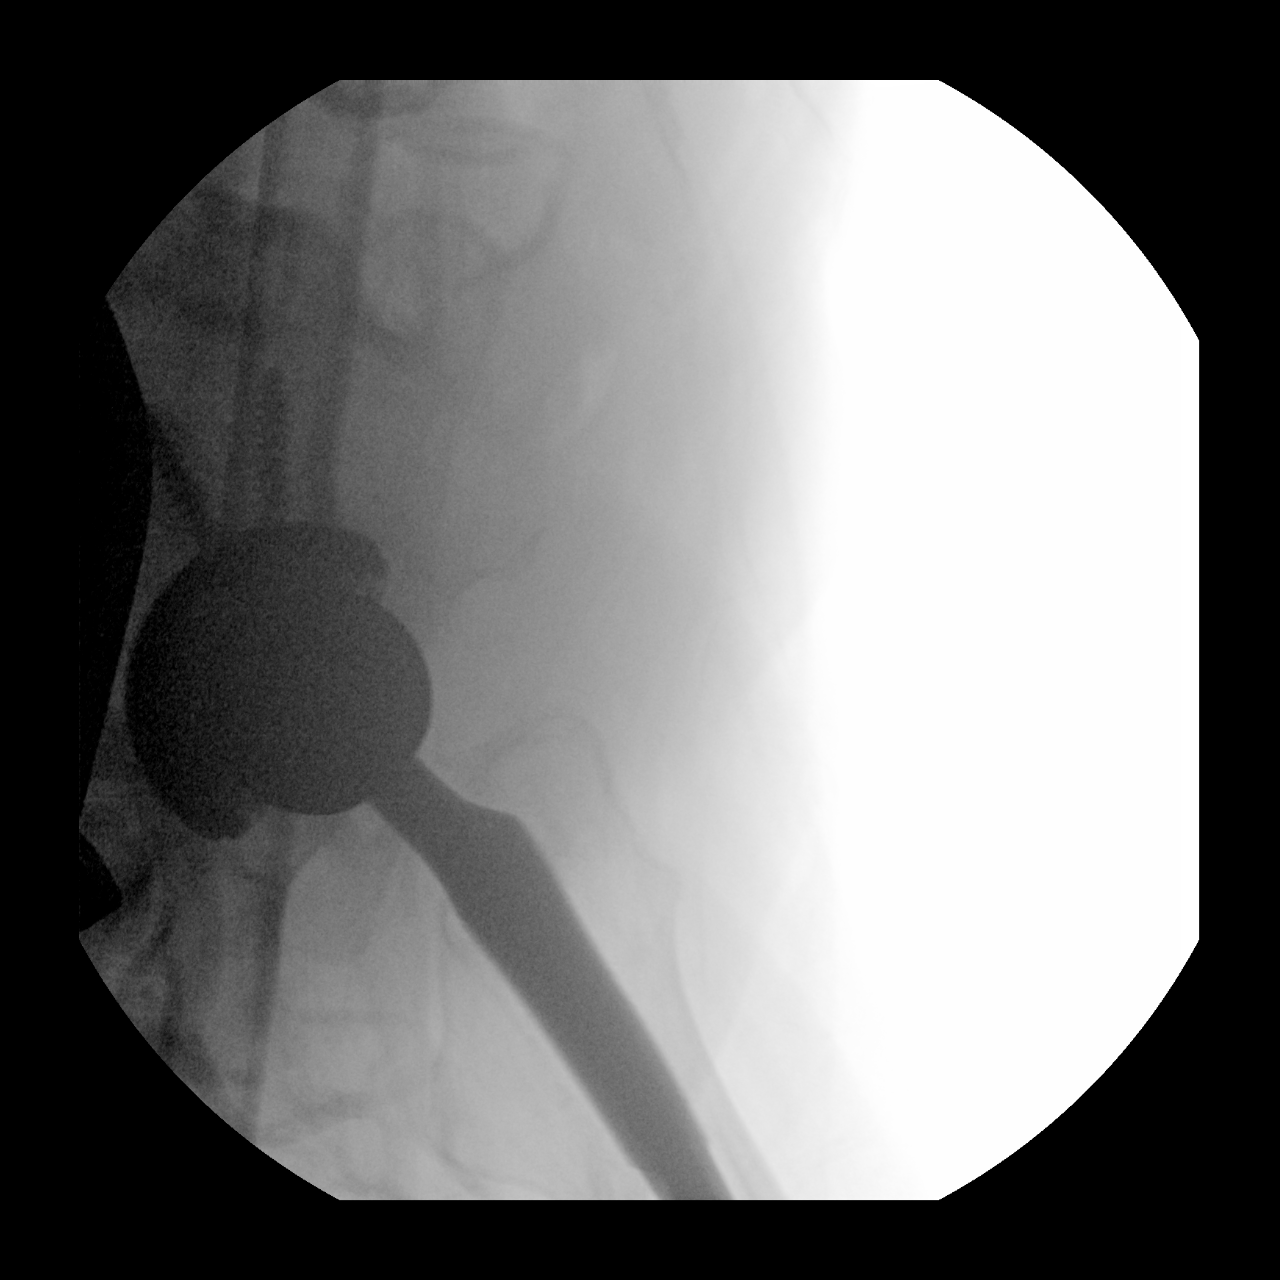

[2 of 2 positions shown; findings below may reference images not displayed]

FINDINGS: Interval reduction of the previously seen dislocated left hip
replacement. Normal alignment on these intraoperative images. No
visible complicating feature.
IMPRESSION: Interval reduction of the dislocated hip replacement.

## 2021-01-17 ENCOUNTER — Encounter: Payer: Self-pay | Admitting: Physician Assistant

## 2021-01-23 ENCOUNTER — Ambulatory Visit (HOSPITAL_COMMUNITY)
Admission: RE | Admit: 2021-01-23 | Payer: Self-pay | Source: Ambulatory Visit | Attending: Cardiology | Admitting: Cardiology

## 2021-02-04 ENCOUNTER — Encounter: Payer: Self-pay | Admitting: Physician Assistant

## 2021-05-16 ENCOUNTER — Encounter: Payer: Self-pay | Admitting: Physician Assistant
